# Patient Record
Sex: Female | Born: 1945
Health system: Southern US, Community
[De-identification: ages and names within clinical notes are randomized; demographics above are authoritative.]

## PROBLEM LIST (undated history)

## (undated) DIAGNOSIS — F32A Depression, unspecified: Secondary | ICD-10-CM

## (undated) DIAGNOSIS — C649 Malignant neoplasm of unspecified kidney, except renal pelvis: Secondary | ICD-10-CM

## (undated) DIAGNOSIS — M542 Cervicalgia: Secondary | ICD-10-CM

## (undated) DIAGNOSIS — M1710 Unilateral primary osteoarthritis, unspecified knee: Secondary | ICD-10-CM

## (undated) DIAGNOSIS — M47812 Spondylosis without myelopathy or radiculopathy, cervical region: Secondary | ICD-10-CM

## (undated) DIAGNOSIS — M1990 Unspecified osteoarthritis, unspecified site: Secondary | ICD-10-CM

## (undated) DIAGNOSIS — F329 Major depressive disorder, single episode, unspecified: Secondary | ICD-10-CM

## (undated) DIAGNOSIS — C50911 Malignant neoplasm of unspecified site of right female breast: Secondary | ICD-10-CM

## (undated) DIAGNOSIS — K219 Gastro-esophageal reflux disease without esophagitis: Secondary | ICD-10-CM

## (undated) DIAGNOSIS — M2550 Pain in unspecified joint: Secondary | ICD-10-CM

## (undated) DIAGNOSIS — M545 Low back pain, unspecified: Secondary | ICD-10-CM

## (undated) DIAGNOSIS — I10 Essential (primary) hypertension: Secondary | ICD-10-CM

## (undated) DIAGNOSIS — M76899 Other specified enthesopathies of unspecified lower limb, excluding foot: Secondary | ICD-10-CM

## (undated) DIAGNOSIS — Z8719 Personal history of other diseases of the digestive system: Secondary | ICD-10-CM

## (undated) DIAGNOSIS — F419 Anxiety disorder, unspecified: Secondary | ICD-10-CM

## (undated) DIAGNOSIS — M2540 Effusion, unspecified joint: Secondary | ICD-10-CM

## (undated) DIAGNOSIS — E890 Postprocedural hypothyroidism: Secondary | ICD-10-CM

## (undated) DIAGNOSIS — R002 Palpitations: Secondary | ICD-10-CM

## (undated) DIAGNOSIS — G4730 Sleep apnea, unspecified: Secondary | ICD-10-CM

## (undated) DIAGNOSIS — R200 Anesthesia of skin: Secondary | ICD-10-CM

## (undated) DIAGNOSIS — E0500 Thyrotoxicosis with diffuse goiter without thyrotoxic crisis or storm: Secondary | ICD-10-CM

## (undated) DIAGNOSIS — R51 Headache: Secondary | ICD-10-CM

## (undated) DIAGNOSIS — Z87898 Personal history of other specified conditions: Secondary | ICD-10-CM

## (undated) DIAGNOSIS — M5030 Other cervical disc degeneration, unspecified cervical region: Secondary | ICD-10-CM

## (undated) DIAGNOSIS — N390 Urinary tract infection, site not specified: Secondary | ICD-10-CM

## (undated) DIAGNOSIS — G8929 Other chronic pain: Secondary | ICD-10-CM

## (undated) DIAGNOSIS — K5900 Constipation, unspecified: Secondary | ICD-10-CM

## (undated) DIAGNOSIS — R0602 Shortness of breath: Secondary | ICD-10-CM

## (undated) DIAGNOSIS — K279 Peptic ulcer, site unspecified, unspecified as acute or chronic, without hemorrhage or perforation: Secondary | ICD-10-CM

## (undated) DIAGNOSIS — G4700 Insomnia, unspecified: Secondary | ICD-10-CM

## (undated) DIAGNOSIS — K5730 Diverticulosis of large intestine without perforation or abscess without bleeding: Secondary | ICD-10-CM

## (undated) DIAGNOSIS — N2889 Other specified disorders of kidney and ureter: Secondary | ICD-10-CM

## (undated) DIAGNOSIS — E7800 Pure hypercholesterolemia, unspecified: Secondary | ICD-10-CM

## (undated) DIAGNOSIS — J301 Allergic rhinitis due to pollen: Secondary | ICD-10-CM

## (undated) HISTORY — DX: Spondylosis without myelopathy or radiculopathy, cervical region: M47.812

## (undated) HISTORY — PX: KNEE ARTHROSCOPY: SHX127

## (undated) HISTORY — DX: Morbid (severe) obesity due to excess calories: E66.01

## (undated) HISTORY — DX: Allergic rhinitis due to pollen: J30.1

## (undated) HISTORY — PX: APPENDECTOMY: SHX54

## (undated) HISTORY — DX: Essential (primary) hypertension: I10

## (undated) HISTORY — PX: UPPER GASTROINTESTINAL ENDOSCOPY: SHX188

## (undated) HISTORY — DX: Unilateral primary osteoarthritis, unspecified knee: M17.10

## (undated) HISTORY — DX: Pure hypercholesterolemia, unspecified: E78.00

## (undated) HISTORY — PX: BREAST BIOPSY: SHX20

## (undated) HISTORY — DX: Peptic ulcer, site unspecified, unspecified as acute or chronic, without hemorrhage or perforation: K27.9

## (undated) HISTORY — PX: DIAGNOSTIC LAPAROSCOPY: SUR761

## (undated) HISTORY — DX: Diverticulosis of large intestine without perforation or abscess without bleeding: K57.30

## (undated) HISTORY — PX: TUBAL LIGATION: SHX77

## (undated) HISTORY — DX: Palpitations: R00.2

## (undated) HISTORY — DX: Postprocedural hypothyroidism: E89.0

## (undated) HISTORY — PX: JOINT REPLACEMENT: SHX530

## (undated) HISTORY — PX: OTHER SURGICAL HISTORY: SHX169

## (undated) HISTORY — PX: TONSILLECTOMY: SUR1361

## (undated) HISTORY — DX: Cervicalgia: M54.2

## (undated) HISTORY — DX: Malignant neoplasm of unspecified kidney, except renal pelvis: C64.9

## (undated) HISTORY — DX: Other specified enthesopathies of unspecified lower limb, excluding foot: M76.899

## (undated) HISTORY — PX: DILATION AND CURETTAGE OF UTERUS: SHX78

## (undated) HISTORY — DX: Other cervical disc degeneration, unspecified cervical region: M50.30

## (undated) HISTORY — PX: ESOPHAGOGASTRODUODENOSCOPY: SHX1529

## (undated) HISTORY — PX: ABDOMINAL HYSTERECTOMY: SHX81

## (undated) HISTORY — PX: CATARACT EXTRACTION W/ INTRAOCULAR LENS  IMPLANT, BILATERAL: SHX1307

---

## 1986-09-21 HISTORY — PX: BREAST LUMPECTOMY: SHX2

## 2001-04-13 ENCOUNTER — Other Ambulatory Visit: Admission: RE | Admit: 2001-04-13 | Discharge: 2001-04-13 | Payer: Self-pay | Admitting: Family Medicine

## 2001-05-18 ENCOUNTER — Ambulatory Visit (HOSPITAL_COMMUNITY): Admission: RE | Admit: 2001-05-18 | Discharge: 2001-05-18 | Payer: Self-pay | Admitting: General Surgery

## 2001-12-06 ENCOUNTER — Ambulatory Visit (HOSPITAL_COMMUNITY): Admission: RE | Admit: 2001-12-06 | Discharge: 2001-12-06 | Payer: Self-pay | Admitting: Obstetrics and Gynecology

## 2001-12-06 ENCOUNTER — Encounter: Payer: Self-pay | Admitting: Obstetrics and Gynecology

## 2002-10-28 ENCOUNTER — Emergency Department (HOSPITAL_COMMUNITY): Admission: EM | Admit: 2002-10-28 | Discharge: 2002-10-28 | Payer: Self-pay | Admitting: *Deleted

## 2002-10-28 ENCOUNTER — Encounter: Payer: Self-pay | Admitting: *Deleted

## 2002-11-17 ENCOUNTER — Inpatient Hospital Stay (HOSPITAL_COMMUNITY): Admission: EM | Admit: 2002-11-17 | Discharge: 2002-11-21 | Payer: Self-pay | Admitting: Emergency Medicine

## 2002-11-17 ENCOUNTER — Encounter: Payer: Self-pay | Admitting: Emergency Medicine

## 2002-11-18 ENCOUNTER — Encounter: Payer: Self-pay | Admitting: Internal Medicine

## 2002-11-20 ENCOUNTER — Encounter: Payer: Self-pay | Admitting: *Deleted

## 2002-11-20 ENCOUNTER — Encounter: Payer: Self-pay | Admitting: Internal Medicine

## 2002-12-11 ENCOUNTER — Encounter: Payer: Self-pay | Admitting: Family Medicine

## 2002-12-11 ENCOUNTER — Ambulatory Visit (HOSPITAL_COMMUNITY): Admission: RE | Admit: 2002-12-11 | Discharge: 2002-12-11 | Payer: Self-pay | Admitting: Family Medicine

## 2003-12-13 ENCOUNTER — Ambulatory Visit (HOSPITAL_COMMUNITY): Admission: RE | Admit: 2003-12-13 | Discharge: 2003-12-13 | Payer: Self-pay | Admitting: Family Medicine

## 2005-04-10 ENCOUNTER — Ambulatory Visit (HOSPITAL_COMMUNITY): Admission: RE | Admit: 2005-04-10 | Discharge: 2005-04-10 | Payer: Self-pay | Admitting: Family Medicine

## 2005-05-05 ENCOUNTER — Emergency Department (HOSPITAL_COMMUNITY): Admission: EM | Admit: 2005-05-05 | Discharge: 2005-05-06 | Payer: Self-pay | Admitting: *Deleted

## 2005-10-09 ENCOUNTER — Emergency Department (HOSPITAL_COMMUNITY): Admission: EM | Admit: 2005-10-09 | Discharge: 2005-10-10 | Payer: Self-pay | Admitting: Emergency Medicine

## 2006-06-14 ENCOUNTER — Ambulatory Visit: Payer: Self-pay | Admitting: Family Medicine

## 2006-06-23 ENCOUNTER — Ambulatory Visit (HOSPITAL_COMMUNITY): Admission: RE | Admit: 2006-06-23 | Discharge: 2006-06-23 | Payer: Self-pay | Admitting: Family Medicine

## 2006-07-12 ENCOUNTER — Ambulatory Visit: Payer: Self-pay | Admitting: Family Medicine

## 2006-07-14 ENCOUNTER — Ambulatory Visit: Payer: Self-pay | Admitting: Internal Medicine

## 2006-07-19 ENCOUNTER — Ambulatory Visit: Payer: Self-pay | Admitting: Family Medicine

## 2006-09-07 ENCOUNTER — Encounter: Payer: Self-pay | Admitting: Family Medicine

## 2006-09-07 DIAGNOSIS — M1990 Unspecified osteoarthritis, unspecified site: Secondary | ICD-10-CM | POA: Insufficient documentation

## 2006-09-07 DIAGNOSIS — E785 Hyperlipidemia, unspecified: Secondary | ICD-10-CM | POA: Insufficient documentation

## 2006-09-07 DIAGNOSIS — E039 Hypothyroidism, unspecified: Secondary | ICD-10-CM | POA: Insufficient documentation

## 2006-09-07 DIAGNOSIS — K219 Gastro-esophageal reflux disease without esophagitis: Secondary | ICD-10-CM | POA: Insufficient documentation

## 2006-09-07 DIAGNOSIS — I10 Essential (primary) hypertension: Secondary | ICD-10-CM | POA: Insufficient documentation

## 2006-09-07 DIAGNOSIS — N6019 Diffuse cystic mastopathy of unspecified breast: Secondary | ICD-10-CM | POA: Insufficient documentation

## 2006-09-07 DIAGNOSIS — N318 Other neuromuscular dysfunction of bladder: Secondary | ICD-10-CM | POA: Insufficient documentation

## 2006-09-07 DIAGNOSIS — K5730 Diverticulosis of large intestine without perforation or abscess without bleeding: Secondary | ICD-10-CM | POA: Insufficient documentation

## 2006-09-24 ENCOUNTER — Ambulatory Visit: Payer: Self-pay | Admitting: Family Medicine

## 2006-09-25 ENCOUNTER — Encounter (INDEPENDENT_AMBULATORY_CARE_PROVIDER_SITE_OTHER): Payer: Self-pay | Admitting: Family Medicine

## 2006-09-25 LAB — CONVERTED CEMR LAB
BUN: 13 mg/dL (ref 6–23)
Basophils Absolute: 0 10*3/uL (ref 0.0–0.1)
Basophils Relative: 1 % (ref 0–1)
CO2: 24 meq/L (ref 19–32)
Calcium: 9.3 mg/dL (ref 8.4–10.5)
Chloride: 102 meq/L (ref 96–112)
Creatinine, Ser: 0.92 mg/dL (ref 0.40–1.20)
Eosinophils Relative: 2 % (ref 0–5)
Glucose, Bld: 72 mg/dL (ref 70–99)
HCT: 46 % (ref 36.0–46.0)
Hemoglobin: 15.2 g/dL — ABNORMAL HIGH (ref 12.0–15.0)
Lymphocytes Relative: 37 % (ref 12–46)
Lymphs Abs: 2.6 10*3/uL (ref 0.7–3.3)
MCHC: 33 g/dL (ref 30.0–36.0)
MCV: 92.7 fL (ref 78.0–100.0)
Monocytes Absolute: 0.6 10*3/uL (ref 0.2–0.7)
Monocytes Relative: 8 % (ref 3–11)
Neutro Abs: 3.7 10*3/uL (ref 1.7–7.7)
Neutrophils Relative %: 52 % (ref 43–77)
Platelets: 268 10*3/uL (ref 150–400)
Potassium: 3.9 meq/L (ref 3.5–5.3)
RBC: 4.96 M/uL (ref 3.87–5.11)
RDW: 12.7 % (ref 11.5–14.0)
Sodium: 140 meq/L (ref 135–145)
TSH: 0.331 microintl units/mL — ABNORMAL LOW (ref 0.350–5.50)
WBC: 7.1 10*3/uL (ref 4.0–10.5)

## 2006-09-27 ENCOUNTER — Encounter (INDEPENDENT_AMBULATORY_CARE_PROVIDER_SITE_OTHER): Payer: Self-pay | Admitting: Family Medicine

## 2006-10-08 ENCOUNTER — Ambulatory Visit: Payer: Self-pay | Admitting: Family Medicine

## 2006-10-11 ENCOUNTER — Ambulatory Visit (HOSPITAL_COMMUNITY): Admission: RE | Admit: 2006-10-11 | Discharge: 2006-10-11 | Payer: Self-pay | Admitting: Family Medicine

## 2006-10-29 ENCOUNTER — Telehealth (INDEPENDENT_AMBULATORY_CARE_PROVIDER_SITE_OTHER): Payer: Self-pay | Admitting: Family Medicine

## 2006-10-29 ENCOUNTER — Ambulatory Visit: Payer: Self-pay | Admitting: Family Medicine

## 2006-10-29 DIAGNOSIS — E669 Obesity, unspecified: Secondary | ICD-10-CM | POA: Insufficient documentation

## 2006-10-29 DIAGNOSIS — J301 Allergic rhinitis due to pollen: Secondary | ICD-10-CM | POA: Insufficient documentation

## 2006-10-29 LAB — CONVERTED CEMR LAB

## 2006-11-02 ENCOUNTER — Telehealth (INDEPENDENT_AMBULATORY_CARE_PROVIDER_SITE_OTHER): Payer: Self-pay | Admitting: Family Medicine

## 2006-11-03 ENCOUNTER — Encounter (INDEPENDENT_AMBULATORY_CARE_PROVIDER_SITE_OTHER): Payer: Self-pay | Admitting: *Deleted

## 2006-11-05 ENCOUNTER — Ambulatory Visit (HOSPITAL_COMMUNITY): Admission: RE | Admit: 2006-11-05 | Discharge: 2006-11-05 | Payer: Self-pay | Admitting: Family Medicine

## 2006-11-05 ENCOUNTER — Encounter (INDEPENDENT_AMBULATORY_CARE_PROVIDER_SITE_OTHER): Payer: Self-pay | Admitting: Family Medicine

## 2006-11-10 ENCOUNTER — Encounter (INDEPENDENT_AMBULATORY_CARE_PROVIDER_SITE_OTHER): Payer: Self-pay | Admitting: Family Medicine

## 2006-11-10 ENCOUNTER — Encounter (INDEPENDENT_AMBULATORY_CARE_PROVIDER_SITE_OTHER): Payer: Self-pay | Admitting: *Deleted

## 2006-11-17 ENCOUNTER — Ambulatory Visit: Payer: Self-pay | Admitting: Orthopedic Surgery

## 2006-11-26 ENCOUNTER — Encounter (INDEPENDENT_AMBULATORY_CARE_PROVIDER_SITE_OTHER): Payer: Self-pay | Admitting: Family Medicine

## 2006-12-20 ENCOUNTER — Ambulatory Visit: Payer: Self-pay | Admitting: Orthopedic Surgery

## 2007-01-17 ENCOUNTER — Ambulatory Visit: Payer: Self-pay | Admitting: Orthopedic Surgery

## 2007-01-24 ENCOUNTER — Encounter (HOSPITAL_COMMUNITY): Admission: RE | Admit: 2007-01-24 | Discharge: 2007-02-23 | Payer: Self-pay | Admitting: Orthopedic Surgery

## 2007-01-31 ENCOUNTER — Ambulatory Visit: Payer: Self-pay | Admitting: Orthopedic Surgery

## 2007-02-21 ENCOUNTER — Ambulatory Visit: Payer: Self-pay | Admitting: Orthopedic Surgery

## 2007-03-02 ENCOUNTER — Ambulatory Visit: Payer: Self-pay | Admitting: Family Medicine

## 2007-03-02 LAB — CONVERTED CEMR LAB
Bilirubin Urine: NEGATIVE
Cholesterol, target level: 200 mg/dL
Glucose, Urine, Semiquant: NEGATIVE
HDL goal, serum: 40 mg/dL
Ketones, urine, test strip: NEGATIVE
LDL Goal: 130 mg/dL
Nitrite: POSITIVE
Protein, U semiquant: NEGATIVE
Specific Gravity, Urine: 1.01
Urobilinogen, UA: 0.2
pH: 6

## 2007-03-03 ENCOUNTER — Encounter (INDEPENDENT_AMBULATORY_CARE_PROVIDER_SITE_OTHER): Payer: Self-pay | Admitting: Family Medicine

## 2007-03-07 LAB — CONVERTED CEMR LAB
ALT: 20 units/L (ref 0–35)
AST: 16 units/L (ref 0–37)
Albumin: 4.1 g/dL (ref 3.5–5.2)
Alkaline Phosphatase: 82 units/L (ref 39–117)
BUN: 13 mg/dL (ref 6–23)
Basophils Absolute: 0 10*3/uL (ref 0.0–0.1)
Basophils Relative: 1 % (ref 0–1)
CO2: 27 meq/L (ref 19–32)
Calcium: 8.8 mg/dL (ref 8.4–10.5)
Chloride: 103 meq/L (ref 96–112)
Cholesterol: 216 mg/dL — ABNORMAL HIGH (ref 0–200)
Creatinine, Ser: 1.04 mg/dL (ref 0.40–1.20)
Eosinophils Absolute: 0.1 10*3/uL (ref 0.0–0.7)
Eosinophils Relative: 2 % (ref 0–5)
Glucose, Bld: 73 mg/dL (ref 70–99)
HCT: 43.4 % (ref 36.0–46.0)
HDL: 55 mg/dL (ref >39)
Hemoglobin: 13.7 g/dL (ref 12.0–15.0)
LDL Cholesterol: 149 mg/dL — ABNORMAL HIGH (ref 0–99)
Lymphocytes Relative: 29 % (ref 12–46)
Lymphs Abs: 1.7 10*3/uL (ref 0.7–3.3)
MCHC: 31.6 g/dL (ref 30.0–36.0)
MCV: 97.3 fL (ref 78.0–100.0)
Monocytes Absolute: 0.6 10*3/uL (ref 0.2–0.7)
Monocytes Relative: 9 % (ref 3–11)
Neutro Abs: 3.6 10*3/uL (ref 1.7–7.7)
Neutrophils Relative %: 60 % (ref 43–77)
Platelets: 311 10*3/uL (ref 150–400)
Potassium: 4.7 meq/L (ref 3.5–5.3)
RBC: 4.46 M/uL (ref 3.87–5.11)
RDW: 13.5 % (ref 11.5–14.0)
Sodium: 139 meq/L (ref 135–145)
TSH: 4.865 microintl units/mL (ref 0.350–5.50)
Total Bilirubin: 0.4 mg/dL (ref 0.3–1.2)
Total CHOL/HDL Ratio: 3.9
Total Protein: 7.1 g/dL (ref 6.0–8.3)
Triglycerides: 58 mg/dL (ref <150)
VLDL: 12 mg/dL (ref 0–40)
WBC: 6 10*3/uL (ref 4.0–10.5)

## 2007-03-14 ENCOUNTER — Ambulatory Visit: Payer: Self-pay | Admitting: Orthopedic Surgery

## 2007-04-13 ENCOUNTER — Ambulatory Visit: Payer: Self-pay | Admitting: Family Medicine

## 2007-04-13 ENCOUNTER — Telehealth (INDEPENDENT_AMBULATORY_CARE_PROVIDER_SITE_OTHER): Payer: Self-pay | Admitting: *Deleted

## 2007-04-13 ENCOUNTER — Ambulatory Visit (HOSPITAL_COMMUNITY): Admission: RE | Admit: 2007-04-13 | Discharge: 2007-04-13 | Payer: Self-pay | Admitting: Family Medicine

## 2007-04-18 ENCOUNTER — Ambulatory Visit: Payer: Self-pay | Admitting: Orthopedic Surgery

## 2007-04-20 ENCOUNTER — Ambulatory Visit (HOSPITAL_COMMUNITY): Admission: RE | Admit: 2007-04-20 | Discharge: 2007-04-20 | Payer: Self-pay | Admitting: Orthopedic Surgery

## 2007-04-20 ENCOUNTER — Ambulatory Visit: Payer: Self-pay | Admitting: Orthopedic Surgery

## 2007-04-21 ENCOUNTER — Ambulatory Visit: Payer: Self-pay | Admitting: Orthopedic Surgery

## 2007-04-22 ENCOUNTER — Encounter (HOSPITAL_COMMUNITY): Admission: RE | Admit: 2007-04-22 | Discharge: 2007-05-22 | Payer: Self-pay | Admitting: Orthopedic Surgery

## 2007-04-25 ENCOUNTER — Ambulatory Visit: Payer: Self-pay | Admitting: Orthopedic Surgery

## 2007-05-10 ENCOUNTER — Ambulatory Visit: Payer: Self-pay | Admitting: Orthopedic Surgery

## 2007-05-23 HISTORY — PX: COLONOSCOPY: SHX174

## 2007-05-25 ENCOUNTER — Ambulatory Visit: Payer: Self-pay | Admitting: Family Medicine

## 2007-05-25 LAB — CONVERTED CEMR LAB
ALT: 17 units/L (ref 0–35)
AST: 25 units/L (ref 0–37)
Albumin: 3.7 g/dL (ref 3.5–5.2)
Alkaline Phosphatase: 63 units/L (ref 39–117)
Amylase: 312 units/L — ABNORMAL HIGH (ref 27–131)
BUN: 10 mg/dL (ref 6–23)
Basophils Absolute: 0 10*3/uL (ref 0.0–0.1)
Basophils Relative: 0 % (ref 0–1)
CO2: 25 meq/L (ref 19–32)
Calcium: 8.9 mg/dL (ref 8.4–10.5)
Chloride: 107 meq/L (ref 96–112)
Creatinine, Ser: 0.93 mg/dL (ref 0.40–1.20)
Eosinophils Absolute: 0.2 10*3/uL (ref 0.0–0.7)
Eosinophils Relative: 3 % (ref 0–5)
Glucose, Bld: 107 mg/dL — ABNORMAL HIGH (ref 70–99)
HCT: 40.6 % (ref 36.0–46.0)
Hemoglobin: 13.7 g/dL (ref 12.0–15.0)
Lipase: 34 units/L (ref 11–59)
Lymphocytes Relative: 27 % (ref 12–46)
Lymphs Abs: 1.8 10*3/uL (ref 0.7–3.3)
MCHC: 33.8 g/dL (ref 30.0–36.0)
MCV: 93.7 fL (ref 78.0–100.0)
Monocytes Absolute: 0.5 10*3/uL (ref 0.2–0.7)
Monocytes Relative: 7 % (ref 3–11)
Neutro Abs: 4.4 10*3/uL (ref 1.7–7.7)
Neutrophils Relative %: 63 % (ref 43–77)
Platelets: 217 10*3/uL (ref 150–400)
Potassium: 3.5 meq/L (ref 3.5–5.3)
RBC: 4.33 M/uL (ref 3.87–5.11)
RDW: 13.1 % (ref 11.5–14.0)
Sodium: 139 meq/L (ref 135–145)
Total Bilirubin: 0.5 mg/dL (ref 0.3–1.2)
Total Protein: 6.3 g/dL (ref 6.0–8.3)
WBC: 6.9 10*3/uL (ref 4.0–10.5)

## 2007-05-27 ENCOUNTER — Ambulatory Visit (HOSPITAL_COMMUNITY): Admission: RE | Admit: 2007-05-27 | Discharge: 2007-05-27 | Payer: Self-pay | Admitting: Family Medicine

## 2007-06-07 ENCOUNTER — Ambulatory Visit: Payer: Self-pay | Admitting: Internal Medicine

## 2007-06-07 ENCOUNTER — Ambulatory Visit (HOSPITAL_COMMUNITY): Admission: RE | Admit: 2007-06-07 | Discharge: 2007-06-07 | Payer: Self-pay | Admitting: Internal Medicine

## 2007-06-13 ENCOUNTER — Encounter (INDEPENDENT_AMBULATORY_CARE_PROVIDER_SITE_OTHER): Payer: Self-pay | Admitting: *Deleted

## 2007-06-13 ENCOUNTER — Ambulatory Visit: Payer: Self-pay | Admitting: Orthopedic Surgery

## 2007-06-13 DIAGNOSIS — S83289A Other tear of lateral meniscus, current injury, unspecified knee, initial encounter: Secondary | ICD-10-CM | POA: Insufficient documentation

## 2007-06-17 ENCOUNTER — Telehealth (INDEPENDENT_AMBULATORY_CARE_PROVIDER_SITE_OTHER): Payer: Self-pay | Admitting: *Deleted

## 2007-06-20 ENCOUNTER — Ambulatory Visit: Payer: Self-pay | Admitting: Orthopedic Surgery

## 2007-06-21 ENCOUNTER — Encounter: Payer: Self-pay | Admitting: Orthopedic Surgery

## 2007-06-27 ENCOUNTER — Ambulatory Visit (HOSPITAL_COMMUNITY): Admission: RE | Admit: 2007-06-27 | Discharge: 2007-06-27 | Payer: Self-pay | Admitting: Family Medicine

## 2007-06-28 ENCOUNTER — Telehealth (INDEPENDENT_AMBULATORY_CARE_PROVIDER_SITE_OTHER): Payer: Self-pay | Admitting: *Deleted

## 2007-06-28 ENCOUNTER — Encounter (INDEPENDENT_AMBULATORY_CARE_PROVIDER_SITE_OTHER): Payer: Self-pay | Admitting: Family Medicine

## 2007-07-04 ENCOUNTER — Ambulatory Visit: Payer: Self-pay | Admitting: Orthopedic Surgery

## 2007-07-11 ENCOUNTER — Telehealth (INDEPENDENT_AMBULATORY_CARE_PROVIDER_SITE_OTHER): Payer: Self-pay | Admitting: *Deleted

## 2007-07-11 ENCOUNTER — Encounter (INDEPENDENT_AMBULATORY_CARE_PROVIDER_SITE_OTHER): Payer: Self-pay | Admitting: *Deleted

## 2007-07-11 ENCOUNTER — Ambulatory Visit: Payer: Self-pay | Admitting: Family Medicine

## 2007-07-19 ENCOUNTER — Encounter (INDEPENDENT_AMBULATORY_CARE_PROVIDER_SITE_OTHER): Payer: Self-pay | Admitting: Family Medicine

## 2007-07-20 ENCOUNTER — Telehealth (INDEPENDENT_AMBULATORY_CARE_PROVIDER_SITE_OTHER): Payer: Self-pay | Admitting: *Deleted

## 2007-07-20 LAB — CONVERTED CEMR LAB
ALT: 17 units/L (ref 0–35)
AST: 19 units/L (ref 0–37)
Albumin: 4.1 g/dL (ref 3.5–5.2)
Alkaline Phosphatase: 79 units/L (ref 39–117)
BUN: 12 mg/dL (ref 6–23)
CO2: 28 meq/L (ref 19–32)
Calcium: 9.2 mg/dL (ref 8.4–10.5)
Chloride: 101 meq/L (ref 96–112)
Cholesterol: 163 mg/dL (ref 0–200)
Creatinine, Ser: 0.92 mg/dL (ref 0.40–1.20)
Glucose, Bld: 87 mg/dL (ref 70–99)
HDL: 52 mg/dL (ref >39)
LDL Cholesterol: 96 mg/dL (ref 0–99)
Potassium: 4.4 meq/L (ref 3.5–5.3)
Sodium: 141 meq/L (ref 135–145)
Total Bilirubin: 0.7 mg/dL (ref 0.3–1.2)
Total CHOL/HDL Ratio: 3.1
Total Protein: 7.1 g/dL (ref 6.0–8.3)
Triglycerides: 76 mg/dL (ref <150)
VLDL: 15 mg/dL (ref 0–40)

## 2007-07-21 ENCOUNTER — Encounter (INDEPENDENT_AMBULATORY_CARE_PROVIDER_SITE_OTHER): Payer: Self-pay | Admitting: Family Medicine

## 2007-08-01 ENCOUNTER — Ambulatory Visit: Admission: RE | Admit: 2007-08-01 | Discharge: 2007-08-01 | Payer: Self-pay | Admitting: Family Medicine

## 2007-08-01 ENCOUNTER — Encounter (INDEPENDENT_AMBULATORY_CARE_PROVIDER_SITE_OTHER): Payer: Self-pay | Admitting: Family Medicine

## 2007-08-02 ENCOUNTER — Ambulatory Visit: Payer: Self-pay | Admitting: Orthopedic Surgery

## 2007-08-09 ENCOUNTER — Ambulatory Visit: Payer: Self-pay | Admitting: Pulmonary Disease

## 2007-08-15 ENCOUNTER — Ambulatory Visit: Payer: Self-pay | Admitting: Family Medicine

## 2007-08-15 ENCOUNTER — Telehealth (INDEPENDENT_AMBULATORY_CARE_PROVIDER_SITE_OTHER): Payer: Self-pay | Admitting: *Deleted

## 2007-08-25 ENCOUNTER — Encounter (INDEPENDENT_AMBULATORY_CARE_PROVIDER_SITE_OTHER): Payer: Self-pay | Admitting: Family Medicine

## 2007-08-29 ENCOUNTER — Encounter (INDEPENDENT_AMBULATORY_CARE_PROVIDER_SITE_OTHER): Payer: Self-pay | Admitting: Family Medicine

## 2007-09-02 ENCOUNTER — Encounter (INDEPENDENT_AMBULATORY_CARE_PROVIDER_SITE_OTHER): Payer: Self-pay | Admitting: Family Medicine

## 2007-09-26 ENCOUNTER — Ambulatory Visit: Payer: Self-pay | Admitting: Family Medicine

## 2007-10-10 ENCOUNTER — Encounter (INDEPENDENT_AMBULATORY_CARE_PROVIDER_SITE_OTHER): Payer: Self-pay | Admitting: Family Medicine

## 2007-10-19 ENCOUNTER — Ambulatory Visit: Payer: Self-pay | Admitting: Orthopedic Surgery

## 2007-10-19 DIAGNOSIS — M7650 Patellar tendinitis, unspecified knee: Secondary | ICD-10-CM | POA: Insufficient documentation

## 2007-10-26 ENCOUNTER — Encounter (INDEPENDENT_AMBULATORY_CARE_PROVIDER_SITE_OTHER): Payer: Self-pay | Admitting: Family Medicine

## 2007-11-02 ENCOUNTER — Ambulatory Visit: Payer: Self-pay | Admitting: Orthopedic Surgery

## 2007-11-04 ENCOUNTER — Encounter (INDEPENDENT_AMBULATORY_CARE_PROVIDER_SITE_OTHER): Payer: Self-pay | Admitting: Family Medicine

## 2007-11-24 ENCOUNTER — Telehealth (INDEPENDENT_AMBULATORY_CARE_PROVIDER_SITE_OTHER): Payer: Self-pay | Admitting: *Deleted

## 2007-12-08 ENCOUNTER — Ambulatory Visit: Payer: Self-pay | Admitting: Family Medicine

## 2007-12-14 ENCOUNTER — Ambulatory Visit: Payer: Self-pay | Admitting: Orthopedic Surgery

## 2007-12-21 ENCOUNTER — Telehealth: Payer: Self-pay | Admitting: Orthopedic Surgery

## 2008-01-16 ENCOUNTER — Ambulatory Visit: Payer: Self-pay | Admitting: Orthopedic Surgery

## 2008-02-02 ENCOUNTER — Ambulatory Visit: Payer: Self-pay | Admitting: Family Medicine

## 2008-02-02 ENCOUNTER — Telehealth (INDEPENDENT_AMBULATORY_CARE_PROVIDER_SITE_OTHER): Payer: Self-pay | Admitting: *Deleted

## 2008-02-03 ENCOUNTER — Telehealth (INDEPENDENT_AMBULATORY_CARE_PROVIDER_SITE_OTHER): Payer: Self-pay | Admitting: *Deleted

## 2008-02-03 LAB — CONVERTED CEMR LAB: TSH: 2.614 microintl units/mL (ref 0.350–5.50)

## 2008-02-07 ENCOUNTER — Telehealth: Payer: Self-pay | Admitting: Orthopedic Surgery

## 2008-02-08 ENCOUNTER — Ambulatory Visit (HOSPITAL_COMMUNITY): Admission: RE | Admit: 2008-02-08 | Discharge: 2008-02-08 | Payer: Self-pay | Admitting: Family Medicine

## 2008-02-08 ENCOUNTER — Encounter (INDEPENDENT_AMBULATORY_CARE_PROVIDER_SITE_OTHER): Payer: Self-pay | Admitting: Family Medicine

## 2008-02-10 ENCOUNTER — Encounter (INDEPENDENT_AMBULATORY_CARE_PROVIDER_SITE_OTHER): Payer: Self-pay | Admitting: Family Medicine

## 2008-02-15 ENCOUNTER — Ambulatory Visit: Payer: Self-pay | Admitting: Orthopedic Surgery

## 2008-02-15 DIAGNOSIS — M4802 Spinal stenosis, cervical region: Secondary | ICD-10-CM | POA: Insufficient documentation

## 2008-02-15 DIAGNOSIS — M48061 Spinal stenosis, lumbar region without neurogenic claudication: Secondary | ICD-10-CM | POA: Insufficient documentation

## 2008-02-20 ENCOUNTER — Encounter: Payer: Self-pay | Admitting: Orthopedic Surgery

## 2008-02-20 ENCOUNTER — Encounter (HOSPITAL_COMMUNITY): Admission: RE | Admit: 2008-02-20 | Discharge: 2008-03-21 | Payer: Self-pay | Admitting: Orthopedic Surgery

## 2008-02-24 ENCOUNTER — Telehealth (INDEPENDENT_AMBULATORY_CARE_PROVIDER_SITE_OTHER): Payer: Self-pay | Admitting: *Deleted

## 2008-02-24 ENCOUNTER — Ambulatory Visit: Payer: Self-pay | Admitting: Family Medicine

## 2008-02-24 LAB — CONVERTED CEMR LAB
Bilirubin Urine: NEGATIVE
Glucose, Urine, Semiquant: NEGATIVE
Ketones, urine, test strip: NEGATIVE
Nitrite: POSITIVE
Protein, U semiquant: NEGATIVE
Specific Gravity, Urine: 1.01
Urobilinogen, UA: 0.2
pH: 6.5

## 2008-02-29 ENCOUNTER — Telehealth: Payer: Self-pay | Admitting: Orthopedic Surgery

## 2008-03-02 ENCOUNTER — Encounter (INDEPENDENT_AMBULATORY_CARE_PROVIDER_SITE_OTHER): Payer: Self-pay | Admitting: Family Medicine

## 2008-03-07 ENCOUNTER — Telehealth: Payer: Self-pay | Admitting: Orthopedic Surgery

## 2008-03-15 ENCOUNTER — Encounter: Payer: Self-pay | Admitting: Orthopedic Surgery

## 2008-03-21 ENCOUNTER — Ambulatory Visit: Payer: Self-pay | Admitting: Orthopedic Surgery

## 2008-03-27 ENCOUNTER — Telehealth: Payer: Self-pay | Admitting: Orthopedic Surgery

## 2008-03-27 ENCOUNTER — Ambulatory Visit (HOSPITAL_COMMUNITY): Admission: RE | Admit: 2008-03-27 | Discharge: 2008-03-27 | Payer: Self-pay | Admitting: Orthopedic Surgery

## 2008-04-02 ENCOUNTER — Ambulatory Visit: Payer: Self-pay | Admitting: Orthopedic Surgery

## 2008-04-02 DIAGNOSIS — M5126 Other intervertebral disc displacement, lumbar region: Secondary | ICD-10-CM | POA: Insufficient documentation

## 2008-04-03 ENCOUNTER — Telehealth: Payer: Self-pay | Admitting: Orthopedic Surgery

## 2008-04-12 ENCOUNTER — Telehealth: Payer: Self-pay | Admitting: Orthopedic Surgery

## 2008-04-13 ENCOUNTER — Ambulatory Visit: Payer: Self-pay | Admitting: Family Medicine

## 2008-04-16 LAB — CONVERTED CEMR LAB
ALT: 40 units/L — ABNORMAL HIGH (ref 0–35)
AST: 14 units/L (ref 0–37)
Albumin: 4.1 g/dL (ref 3.5–5.2)
Alkaline Phosphatase: 71 units/L (ref 39–117)
BUN: 18 mg/dL (ref 6–23)
Basophils Absolute: 0 10*3/uL (ref 0.0–0.1)
Basophils Relative: 0 % (ref 0–1)
CO2: 23 meq/L (ref 19–32)
Calcium: 9 mg/dL (ref 8.4–10.5)
Chloride: 100 meq/L (ref 96–112)
Creatinine, Ser: 0.82 mg/dL (ref 0.40–1.20)
Eosinophils Absolute: 0.1 10*3/uL (ref 0.0–0.7)
Eosinophils Relative: 0 % (ref 0–5)
Glucose, Bld: 94 mg/dL (ref 70–99)
HCT: 44.7 % (ref 36.0–46.0)
Hemoglobin: 15.2 g/dL — ABNORMAL HIGH (ref 12.0–15.0)
Lymphocytes Relative: 9 % — ABNORMAL LOW (ref 12–46)
Lymphs Abs: 1.1 10*3/uL (ref 0.7–4.0)
MCHC: 34 g/dL (ref 30.0–36.0)
MCV: 95.9 fL (ref 78.0–100.0)
Monocytes Absolute: 0.6 10*3/uL (ref 0.1–1.0)
Monocytes Relative: 5 % (ref 3–12)
Neutro Abs: 11.2 10*3/uL — ABNORMAL HIGH (ref 1.7–7.7)
Neutrophils Relative %: 86 % — ABNORMAL HIGH (ref 43–77)
Platelets: 271 10*3/uL (ref 150–400)
Potassium: 4.8 meq/L (ref 3.5–5.3)
RBC: 4.66 M/uL (ref 3.87–5.11)
RDW: 15.3 % (ref 11.5–15.5)
Sodium: 139 meq/L (ref 135–145)
TSH: 0.357 microintl units/mL (ref 0.350–4.50)
Total Bilirubin: 0.6 mg/dL (ref 0.3–1.2)
Total Protein: 6.8 g/dL (ref 6.0–8.3)
WBC: 13 10*3/uL — ABNORMAL HIGH (ref 4.0–10.5)

## 2008-04-18 ENCOUNTER — Telehealth (INDEPENDENT_AMBULATORY_CARE_PROVIDER_SITE_OTHER): Payer: Self-pay | Admitting: *Deleted

## 2008-04-20 ENCOUNTER — Telehealth: Payer: Self-pay | Admitting: Orthopedic Surgery

## 2008-04-23 ENCOUNTER — Telehealth (INDEPENDENT_AMBULATORY_CARE_PROVIDER_SITE_OTHER): Payer: Self-pay | Admitting: Family Medicine

## 2008-04-23 ENCOUNTER — Encounter: Admission: RE | Admit: 2008-04-23 | Discharge: 2008-04-23 | Payer: Self-pay | Admitting: Orthopedic Surgery

## 2008-04-27 ENCOUNTER — Encounter (INDEPENDENT_AMBULATORY_CARE_PROVIDER_SITE_OTHER): Payer: Self-pay | Admitting: Family Medicine

## 2008-05-07 ENCOUNTER — Encounter: Admission: RE | Admit: 2008-05-07 | Discharge: 2008-05-07 | Payer: Self-pay | Admitting: Orthopedic Surgery

## 2008-05-21 ENCOUNTER — Encounter: Admission: RE | Admit: 2008-05-21 | Discharge: 2008-05-21 | Payer: Self-pay | Admitting: Orthopedic Surgery

## 2008-05-29 ENCOUNTER — Ambulatory Visit: Payer: Self-pay | Admitting: Family Medicine

## 2008-05-30 ENCOUNTER — Encounter (INDEPENDENT_AMBULATORY_CARE_PROVIDER_SITE_OTHER): Payer: Self-pay | Admitting: Family Medicine

## 2008-05-31 LAB — CONVERTED CEMR LAB
ALT: 25 units/L (ref 0–35)
AST: 17 units/L (ref 0–37)
Albumin: 4.2 g/dL (ref 3.5–5.2)
Alkaline Phosphatase: 74 units/L (ref 39–117)
Bilirubin, Direct: 0.1 mg/dL (ref 0.0–0.3)
Indirect Bilirubin: 0.3 mg/dL (ref 0.0–0.9)
Total Bilirubin: 0.4 mg/dL (ref 0.3–1.2)
Total Protein: 7.2 g/dL (ref 6.0–8.3)

## 2008-06-25 ENCOUNTER — Ambulatory Visit: Payer: Self-pay | Admitting: Orthopedic Surgery

## 2008-06-27 ENCOUNTER — Ambulatory Visit (HOSPITAL_COMMUNITY): Admission: RE | Admit: 2008-06-27 | Discharge: 2008-06-27 | Payer: Self-pay | Admitting: Family Medicine

## 2008-06-27 ENCOUNTER — Ambulatory Visit: Payer: Self-pay | Admitting: Family Medicine

## 2008-07-11 ENCOUNTER — Telehealth (INDEPENDENT_AMBULATORY_CARE_PROVIDER_SITE_OTHER): Payer: Self-pay | Admitting: *Deleted

## 2008-07-16 ENCOUNTER — Ambulatory Visit: Payer: Self-pay | Admitting: Family Medicine

## 2008-07-16 DIAGNOSIS — H8110 Benign paroxysmal vertigo, unspecified ear: Secondary | ICD-10-CM | POA: Insufficient documentation

## 2008-08-08 ENCOUNTER — Ambulatory Visit: Payer: Self-pay | Admitting: Family Medicine

## 2008-08-10 ENCOUNTER — Encounter (INDEPENDENT_AMBULATORY_CARE_PROVIDER_SITE_OTHER): Payer: Self-pay | Admitting: Family Medicine

## 2008-08-14 LAB — CONVERTED CEMR LAB
ALT: 39 units/L — ABNORMAL HIGH (ref 0–35)
AST: 31 units/L (ref 0–37)
Albumin: 4.1 g/dL (ref 3.5–5.2)
Alkaline Phosphatase: 69 units/L (ref 39–117)
BUN: 9 mg/dL (ref 6–23)
Basophils Absolute: 0.1 10*3/uL (ref 0.0–0.1)
Basophils Relative: 1 % (ref 0–1)
CO2: 24 meq/L (ref 19–32)
Calcium: 9 mg/dL (ref 8.4–10.5)
Chloride: 101 meq/L (ref 96–112)
Cholesterol: 184 mg/dL (ref 0–200)
Creatinine, Ser: 0.88 mg/dL (ref 0.40–1.20)
Eosinophils Absolute: 0.2 10*3/uL (ref 0.0–0.7)
Eosinophils Relative: 2 % (ref 0–5)
Glucose, Bld: 104 mg/dL — ABNORMAL HIGH (ref 70–99)
HCT: 42.6 % (ref 36.0–46.0)
HDL: 51 mg/dL (ref >39)
Hemoglobin: 13.6 g/dL (ref 12.0–15.0)
LDL Cholesterol: 105 mg/dL — ABNORMAL HIGH (ref 0–99)
Lymphocytes Relative: 38 % (ref 12–46)
Lymphs Abs: 2.7 10*3/uL (ref 0.7–4.0)
MCHC: 31.9 g/dL (ref 30.0–36.0)
MCV: 94.2 fL (ref 78.0–100.0)
Monocytes Absolute: 0.5 10*3/uL (ref 0.1–1.0)
Monocytes Relative: 7 % (ref 3–12)
Neutro Abs: 3.6 10*3/uL (ref 1.7–7.7)
Neutrophils Relative %: 52 % (ref 43–77)
Platelets: 278 10*3/uL (ref 150–400)
Potassium: 3.9 meq/L (ref 3.5–5.3)
RBC: 4.52 M/uL (ref 3.87–5.11)
RDW: 12.9 % (ref 11.5–15.5)
Sodium: 139 meq/L (ref 135–145)
Total Bilirubin: 0.6 mg/dL (ref 0.3–1.2)
Total CHOL/HDL Ratio: 3.6
Total Protein: 6.8 g/dL (ref 6.0–8.3)
Triglycerides: 138 mg/dL (ref <150)
VLDL: 28 mg/dL (ref 0–40)
WBC: 7 10*3/uL (ref 4.0–10.5)

## 2008-09-06 ENCOUNTER — Encounter: Payer: Self-pay | Admitting: Orthopedic Surgery

## 2008-09-06 ENCOUNTER — Encounter (INDEPENDENT_AMBULATORY_CARE_PROVIDER_SITE_OTHER): Payer: Self-pay | Admitting: Family Medicine

## 2008-09-10 ENCOUNTER — Encounter (INDEPENDENT_AMBULATORY_CARE_PROVIDER_SITE_OTHER): Payer: Self-pay | Admitting: Family Medicine

## 2008-09-25 ENCOUNTER — Ambulatory Visit: Payer: Self-pay | Admitting: Family Medicine

## 2008-09-26 LAB — CONVERTED CEMR LAB
ALT: 41 units/L — ABNORMAL HIGH (ref 0–35)
AST: 47 units/L — ABNORMAL HIGH (ref 0–37)
Albumin: 4.5 g/dL (ref 3.5–5.2)
Alkaline Phosphatase: 57 units/L (ref 39–117)
Bilirubin, Direct: 0.1 mg/dL (ref 0.0–0.3)
Indirect Bilirubin: 0.4 mg/dL (ref 0.0–0.9)
TSH: 0.069 microintl units/mL — ABNORMAL LOW (ref 0.350–4.50)
Total Bilirubin: 0.5 mg/dL (ref 0.3–1.2)
Total Protein: 7.2 g/dL (ref 6.0–8.3)

## 2008-10-01 ENCOUNTER — Ambulatory Visit: Payer: Self-pay | Admitting: Orthopedic Surgery

## 2008-10-02 ENCOUNTER — Encounter (INDEPENDENT_AMBULATORY_CARE_PROVIDER_SITE_OTHER): Payer: Self-pay | Admitting: Family Medicine

## 2008-10-02 ENCOUNTER — Encounter (HOSPITAL_COMMUNITY): Admission: RE | Admit: 2008-10-02 | Discharge: 2008-11-01 | Payer: Self-pay | Admitting: Family Medicine

## 2008-11-01 ENCOUNTER — Encounter (INDEPENDENT_AMBULATORY_CARE_PROVIDER_SITE_OTHER): Payer: Self-pay | Admitting: Family Medicine

## 2008-11-02 ENCOUNTER — Encounter (HOSPITAL_COMMUNITY): Admission: RE | Admit: 2008-11-02 | Discharge: 2008-12-02 | Payer: Self-pay | Admitting: Family Medicine

## 2008-11-14 ENCOUNTER — Encounter: Payer: Self-pay | Admitting: Orthopedic Surgery

## 2008-11-19 ENCOUNTER — Ambulatory Visit: Payer: Self-pay | Admitting: Family Medicine

## 2008-11-20 ENCOUNTER — Encounter (INDEPENDENT_AMBULATORY_CARE_PROVIDER_SITE_OTHER): Payer: Self-pay | Admitting: Family Medicine

## 2008-11-20 LAB — CONVERTED CEMR LAB: TSH: 0.198 microintl units/mL — ABNORMAL LOW (ref 0.350–4.50)

## 2008-11-28 ENCOUNTER — Telehealth (INDEPENDENT_AMBULATORY_CARE_PROVIDER_SITE_OTHER): Payer: Self-pay | Admitting: *Deleted

## 2008-11-28 ENCOUNTER — Encounter (INDEPENDENT_AMBULATORY_CARE_PROVIDER_SITE_OTHER): Payer: Self-pay | Admitting: Family Medicine

## 2008-12-03 ENCOUNTER — Encounter (HOSPITAL_COMMUNITY): Admission: RE | Admit: 2008-12-03 | Discharge: 2009-01-02 | Payer: Self-pay | Admitting: Family Medicine

## 2008-12-12 ENCOUNTER — Encounter (INDEPENDENT_AMBULATORY_CARE_PROVIDER_SITE_OTHER): Payer: Self-pay | Admitting: Family Medicine

## 2008-12-24 ENCOUNTER — Ambulatory Visit: Payer: Self-pay | Admitting: Orthopedic Surgery

## 2008-12-24 DIAGNOSIS — M549 Dorsalgia, unspecified: Secondary | ICD-10-CM | POA: Insufficient documentation

## 2008-12-24 DIAGNOSIS — IMO0002 Reserved for concepts with insufficient information to code with codable children: Secondary | ICD-10-CM | POA: Insufficient documentation

## 2008-12-26 ENCOUNTER — Encounter (INDEPENDENT_AMBULATORY_CARE_PROVIDER_SITE_OTHER): Payer: Self-pay | Admitting: Family Medicine

## 2008-12-31 ENCOUNTER — Ambulatory Visit: Payer: Self-pay | Admitting: Family Medicine

## 2008-12-31 DIAGNOSIS — K279 Peptic ulcer, site unspecified, unspecified as acute or chronic, without hemorrhage or perforation: Secondary | ICD-10-CM | POA: Insufficient documentation

## 2009-01-01 ENCOUNTER — Encounter (INDEPENDENT_AMBULATORY_CARE_PROVIDER_SITE_OTHER): Payer: Self-pay | Admitting: Family Medicine

## 2009-01-01 DIAGNOSIS — Z862 Personal history of diseases of the blood and blood-forming organs and certain disorders involving the immune mechanism: Secondary | ICD-10-CM | POA: Insufficient documentation

## 2009-01-01 DIAGNOSIS — Z8639 Personal history of other endocrine, nutritional and metabolic disease: Secondary | ICD-10-CM

## 2009-01-01 LAB — CONVERTED CEMR LAB
ALT: 54 units/L — ABNORMAL HIGH (ref 0–35)
AST: 34 units/L (ref 0–37)
Albumin: 4.4 g/dL (ref 3.5–5.2)
Alkaline Phosphatase: 76 units/L (ref 39–117)
BUN: 18 mg/dL (ref 6–23)
CO2: 17 meq/L — ABNORMAL LOW (ref 19–32)
Calcium: 8.7 mg/dL (ref 8.4–10.5)
Chloride: 104 meq/L (ref 96–112)
Creatinine, Ser: 0.8 mg/dL (ref 0.40–1.20)
Glucose, Bld: 134 mg/dL — ABNORMAL HIGH (ref 70–99)
Potassium: 4.2 meq/L (ref 3.5–5.3)
Sodium: 139 meq/L (ref 135–145)
TSH: 0.514 microintl units/mL (ref 0.350–4.500)
Total Bilirubin: 0.4 mg/dL (ref 0.3–1.2)
Total Protein: 7.5 g/dL (ref 6.0–8.3)

## 2009-01-07 ENCOUNTER — Ambulatory Visit (HOSPITAL_COMMUNITY): Admission: RE | Admit: 2009-01-07 | Discharge: 2009-01-07 | Payer: Self-pay | Admitting: Family Medicine

## 2009-01-08 ENCOUNTER — Encounter (INDEPENDENT_AMBULATORY_CARE_PROVIDER_SITE_OTHER): Payer: Self-pay | Admitting: *Deleted

## 2009-01-10 ENCOUNTER — Encounter: Payer: Self-pay | Admitting: Orthopedic Surgery

## 2009-01-24 ENCOUNTER — Emergency Department (HOSPITAL_COMMUNITY): Admission: EM | Admit: 2009-01-24 | Discharge: 2009-01-24 | Payer: Self-pay | Admitting: Emergency Medicine

## 2009-01-25 ENCOUNTER — Encounter (INDEPENDENT_AMBULATORY_CARE_PROVIDER_SITE_OTHER): Payer: Self-pay | Admitting: *Deleted

## 2009-02-05 ENCOUNTER — Telehealth (INDEPENDENT_AMBULATORY_CARE_PROVIDER_SITE_OTHER): Payer: Self-pay | Admitting: *Deleted

## 2009-02-06 ENCOUNTER — Ambulatory Visit: Payer: Self-pay | Admitting: Family Medicine

## 2009-02-06 DIAGNOSIS — R002 Palpitations: Secondary | ICD-10-CM | POA: Insufficient documentation

## 2009-02-11 ENCOUNTER — Ambulatory Visit (HOSPITAL_COMMUNITY): Admission: RE | Admit: 2009-02-11 | Discharge: 2009-02-11 | Payer: Self-pay | Admitting: Family Medicine

## 2009-02-13 ENCOUNTER — Ambulatory Visit: Payer: Self-pay | Admitting: Cardiology

## 2009-02-20 ENCOUNTER — Telehealth (INDEPENDENT_AMBULATORY_CARE_PROVIDER_SITE_OTHER): Payer: Self-pay | Admitting: *Deleted

## 2009-02-21 ENCOUNTER — Ambulatory Visit: Payer: Self-pay | Admitting: Family Medicine

## 2009-02-27 ENCOUNTER — Encounter (INDEPENDENT_AMBULATORY_CARE_PROVIDER_SITE_OTHER): Payer: Self-pay | Admitting: Family Medicine

## 2009-03-07 DIAGNOSIS — R1319 Other dysphagia: Secondary | ICD-10-CM | POA: Insufficient documentation

## 2009-03-07 DIAGNOSIS — R079 Chest pain, unspecified: Secondary | ICD-10-CM | POA: Insufficient documentation

## 2009-03-21 ENCOUNTER — Ambulatory Visit: Payer: Self-pay | Admitting: Family Medicine

## 2009-03-22 ENCOUNTER — Encounter (INDEPENDENT_AMBULATORY_CARE_PROVIDER_SITE_OTHER): Payer: Self-pay | Admitting: Family Medicine

## 2009-03-22 LAB — CONVERTED CEMR LAB: TSH: 4.975 microintl units/mL — ABNORMAL HIGH (ref 0.350–4.500)

## 2009-04-03 ENCOUNTER — Encounter (INDEPENDENT_AMBULATORY_CARE_PROVIDER_SITE_OTHER): Payer: Self-pay | Admitting: *Deleted

## 2009-04-03 ENCOUNTER — Ambulatory Visit: Payer: Self-pay | Admitting: Orthopedic Surgery

## 2009-04-03 DIAGNOSIS — M1710 Unilateral primary osteoarthritis, unspecified knee: Secondary | ICD-10-CM

## 2009-04-03 DIAGNOSIS — IMO0002 Reserved for concepts with insufficient information to code with codable children: Secondary | ICD-10-CM | POA: Insufficient documentation

## 2009-04-17 ENCOUNTER — Telehealth (INDEPENDENT_AMBULATORY_CARE_PROVIDER_SITE_OTHER): Payer: Self-pay | Admitting: *Deleted

## 2009-05-03 ENCOUNTER — Ambulatory Visit: Payer: Self-pay | Admitting: Family Medicine

## 2009-05-06 ENCOUNTER — Encounter (INDEPENDENT_AMBULATORY_CARE_PROVIDER_SITE_OTHER): Payer: Self-pay | Admitting: Family Medicine

## 2009-05-06 LAB — CONVERTED CEMR LAB
ALT: 29 units/L (ref 0–35)
AST: 27 units/L (ref 0–37)
Albumin: 4.3 g/dL (ref 3.5–5.2)
Alkaline Phosphatase: 79 units/L (ref 39–117)
BUN: 10 mg/dL (ref 6–23)
CO2: 25 meq/L (ref 19–32)
Calcium: 9.5 mg/dL (ref 8.4–10.5)
Chloride: 101 meq/L (ref 96–112)
Creatinine, Ser: 0.93 mg/dL (ref 0.40–1.20)
Glucose, Bld: 71 mg/dL (ref 70–99)
Potassium: 4.2 meq/L (ref 3.5–5.3)
Sodium: 140 meq/L (ref 135–145)
TSH: 0.203 microintl units/mL — ABNORMAL LOW (ref 0.350–4.500)
Total Bilirubin: 0.4 mg/dL (ref 0.3–1.2)
Total Protein: 7.4 g/dL (ref 6.0–8.3)

## 2009-05-23 ENCOUNTER — Ambulatory Visit: Payer: Self-pay | Admitting: Family Medicine

## 2009-05-23 ENCOUNTER — Ambulatory Visit (HOSPITAL_COMMUNITY): Admission: RE | Admit: 2009-05-23 | Discharge: 2009-05-23 | Payer: Self-pay | Admitting: Family Medicine

## 2009-06-11 ENCOUNTER — Telehealth (INDEPENDENT_AMBULATORY_CARE_PROVIDER_SITE_OTHER): Payer: Self-pay | Admitting: Family Medicine

## 2009-07-03 ENCOUNTER — Ambulatory Visit (HOSPITAL_COMMUNITY): Admission: RE | Admit: 2009-07-03 | Discharge: 2009-07-03 | Payer: Self-pay | Admitting: Obstetrics and Gynecology

## 2009-08-09 ENCOUNTER — Emergency Department (HOSPITAL_COMMUNITY): Admission: EM | Admit: 2009-08-09 | Discharge: 2009-08-09 | Payer: Self-pay | Admitting: Emergency Medicine

## 2010-02-25 ENCOUNTER — Telehealth: Payer: Self-pay | Admitting: Orthopedic Surgery

## 2010-05-29 ENCOUNTER — Ambulatory Visit (HOSPITAL_COMMUNITY): Admission: RE | Admit: 2010-05-29 | Discharge: 2010-05-29 | Payer: Self-pay | Admitting: Neurology

## 2010-06-26 ENCOUNTER — Ambulatory Visit (HOSPITAL_COMMUNITY): Admission: RE | Admit: 2010-06-26 | Discharge: 2010-06-26 | Payer: Self-pay | Admitting: Neurology

## 2010-07-07 ENCOUNTER — Ambulatory Visit (HOSPITAL_COMMUNITY): Admission: RE | Admit: 2010-07-07 | Discharge: 2010-07-07 | Payer: Self-pay | Admitting: Internal Medicine

## 2010-07-08 ENCOUNTER — Encounter: Admission: RE | Admit: 2010-07-08 | Discharge: 2010-07-08 | Payer: Self-pay | Admitting: Neurology

## 2010-08-07 ENCOUNTER — Ambulatory Visit (HOSPITAL_COMMUNITY)
Admission: RE | Admit: 2010-08-07 | Discharge: 2010-08-07 | Payer: Self-pay | Source: Home / Self Care | Admitting: Ophthalmology

## 2010-08-13 ENCOUNTER — Ambulatory Visit (HOSPITAL_COMMUNITY): Admission: RE | Admit: 2010-08-13 | Discharge: 2010-08-13 | Payer: Self-pay | Admitting: Neurology

## 2010-08-21 ENCOUNTER — Ambulatory Visit (HOSPITAL_COMMUNITY)
Admission: RE | Admit: 2010-08-21 | Discharge: 2010-08-21 | Payer: Self-pay | Source: Home / Self Care | Admitting: Ophthalmology

## 2010-09-01 ENCOUNTER — Encounter
Admission: RE | Admit: 2010-09-01 | Discharge: 2010-09-01 | Payer: Self-pay | Source: Home / Self Care | Attending: Neurology | Admitting: Neurology

## 2010-09-12 ENCOUNTER — Ambulatory Visit: Payer: Self-pay | Admitting: Cardiology

## 2010-09-12 DIAGNOSIS — R42 Dizziness and giddiness: Secondary | ICD-10-CM | POA: Insufficient documentation

## 2010-09-16 ENCOUNTER — Encounter: Payer: Self-pay | Admitting: Cardiology

## 2010-10-19 LAB — CONVERTED CEMR LAB
ALT: 37 units/L — ABNORMAL HIGH (ref 0–35)
AST: 32 units/L (ref 0–37)
Albumin: 4.3 g/dL (ref 3.5–5.2)
Alkaline Phosphatase: 80 units/L (ref 39–117)
BUN: 15 mg/dL (ref 6–23)
Bilirubin Urine: NEGATIVE
Blood in Urine, dipstick: NEGATIVE
CO2: 23 meq/L (ref 19–32)
Calcium: 9.4 mg/dL (ref 8.4–10.5)
Chloride: 103 meq/L (ref 96–112)
Creatinine, Ser: 1.05 mg/dL (ref 0.40–1.20)
Glucose, Bld: 82 mg/dL (ref 70–99)
Glucose, Urine, Semiquant: NEGATIVE
Ketones, urine, test strip: NEGATIVE
Nitrite: NEGATIVE
Potassium: 4.1 meq/L (ref 3.5–5.3)
Protein, U semiquant: NEGATIVE
Sodium: 139 meq/L (ref 135–145)
Specific Gravity, Urine: <1.005
TSH: 6.451 microintl units/mL — ABNORMAL HIGH (ref 0.350–4.500)
Total Bilirubin: 0.5 mg/dL (ref 0.3–1.2)
Total Protein: 7.5 g/dL (ref 6.0–8.3)
Urobilinogen, UA: 0.2
WBC Urine, dipstick: NEGATIVE
pH: 6

## 2010-10-21 NOTE — Letter (Signed)
Summary: Handicap Placard Application  Handicap Placard Application   Imported By: Lutricia Horsfall 04/27/2008 13:28:13  _____________________________________________________________________  External Attachment:    Type:   Image     Comment:   External Document

## 2010-10-21 NOTE — Progress Notes (Signed)
Summary: pt called w/question re: Rx steroid dose pack  Phone Note Call from Patient   Caller: Patient Summary of Call: pt called to ask if she was to do a repeat of the steroid dose pack since she saw that she has a refill on it. please call  home [phone] Initial call taken by: Cammie Sickle,  February 29, 2008 3:41 PM  Follow-up for Phone Call        yes if no improvement  Follow-up by: Fuller Canada MD,  February 29, 2008 4:12 PM  Additional Follow-up for Phone Call Additional follow up Details #1::        advised pt Additional Follow-up by: Cammie Sickle,  February 29, 2008 5:11 PM

## 2010-10-21 NOTE — Miscellaneous (Signed)
Summary: Orders Update GI  Clinical Lists Changes  Problems: Added new problem of WELL ADULT EXAM (ICD-V70.0) Orders: Added new Referral order of Gastroenterology Referral (GI) - Signed

## 2010-10-21 NOTE — Miscellaneous (Signed)
  Clinical Lists Changes  Medications: Changed medication from LEVOTHROID 125 MCG TABS (LEVOTHYROXINE SODIUM) Take 1 tablet by mouth once a day to LEVOTHROID 112 MCG TABS (LEVOTHYROXINE SODIUM) Take one tab once daily - Signed Rx of LEVOTHROID 112 MCG TABS (LEVOTHYROXINE SODIUM) Take one tab once daily;  #30 x 4;  Signed;  Entered by: Micah Flesher, LPN;  Authorized by: Franchot Heidelberg MD;  Method used: Electronically to [facility] [address], [place], [place], [place]  [address], Ph: [phone], Fax: [phone] Orders: Added new Test order of T-TSH [phone]) - Signed    Prescriptions: LEVOTHROID 112 MCG TABS (LEVOTHYROXINE SODIUM) Take one tab once daily  #30 x 4   Entered by:   Micah Flesher, LPN   Authorized by:   Franchot Heidelberg MD   Signed by:   Micah Flesher, LPN on 38/75/6433   Method used:   Electronically to        [facility]  [address] (retail)       [address]       [place], [place]  [address]       Ph: [phone]       Fax: [phone]   RxID:   [id number]  Pt called, advised of labs, sent Levothyroid script to [facility], sent lab order to [facility] for TSH in 6 wks.

## 2010-10-21 NOTE — Progress Notes (Signed)
Summary: 11/22/07 lab results  Phone Note Outgoing Call   Call placed by: Sonny Dandy,  November 24, 2007 1:39 PM Summary of Call: message left for patient to return call .................................................................Marland KitchenMarland KitchenBritt Bottom Long  November 24, 2007 1:39 PM  patient returned call and lab results given. New med increase called to [facility] .................................................................Marland KitchenMarland KitchenSonny Dandy  November 24, 2007 2:01 PM  Initial call taken by: Sonny Dandy,  November 24, 2007 2:01 PM

## 2010-10-21 NOTE — Letter (Signed)
Summary: Generic Letter  [facility]  [address]   [place], [place] [address]   Phone: [phone]  Fax: [phone]    01/08/2009  Sara [name] [address] [place], [place]  [address]  Dear Ms. Dolata,   The results of your Ultrasound have been reviewed and found:  Probable Fatty Liver TLC diet, portion control and exercise Keep aapt as scheduled    Sincerely,   Sherilyn Banker LPN

## 2010-10-21 NOTE — Miscellaneous (Signed)
Summary: Orders Update  Clinical Lists Changes  Problems: Added new problem of MEDIAL MENISCUS TEAR (ICD-836.0) Orders: Added new Referral order of Orthopedic Referral (Ortho) - Signed Referral information faxed to provider. Dr. Romeo Apple

## 2010-10-21 NOTE — Assessment & Plan Note (Signed)
Summary: ?SINUS INFECTION/ARC   Vital Signs:  Patient Profile:   65 Years Old Female Height:     66 inches (167.64 cm) Weight:      218 pounds BMI:     35.31 O2 Sat:      96 % Pulse rate:   82 / minute Resp:     16 per minute BP sitting:   132 / 88  Vitals Entered By: Sherilyn Banker (December 08, 2007 10:25 AM)                 PCP:  [name]  Chief Complaint:  sinus congestion.  History of Present Illness: Pt in for acute visit.  She has pain behind eyes and in cheeks. She has been sick for 3 days. She states she has thick mucous and cannot get it out. She is nauseated from post nasald rip. Left ear is full. She is sneezing. She has watery eyes and itching. States cannot get mucous up and cannot comment on color. She denies fever, chills and sweats. She is coughing and notes dry. She denies chest pain, orthopnea, PND and palpitations. Her apetite is good. Denies nausea and vomitting. Denies diarrhea and vconstipation. No urinary sx. Husbnd has same sx. Grandbaby sick as well with similar. She is on Xyzal  and this helps.   Now presents.  Hypertension History:      She denies headache, chest pain, palpitations, dyspnea with exertion, orthopnea, PND, peripheral edema, visual symptoms, neurologic problems, syncope, and side effects from treatment.  She notes no problems with any antihypertensive medication side effects.        Positive major cardiovascular risk factors include female age 85 years old or older, hyperlipidemia, and hypertension.  Negative major cardiovascular risk factors include no history of diabetes, negative family history for ischemic heart disease, and non-tobacco-user status.        Further assessment for target organ damage reveals no history of ASHD, stroke/TIA, or peripheral vascular disease.       Current Allergies: ! DARVON  Past Medical History:    Reviewed history from 09/07/2006 and no changes required:       Diverticulosis, colon  GERD       Hyperlipidemia       Hypertension       Hypothyroidism       Osteoarthritis  Past Surgical History:    Reviewed history from 09/26/2007 and no changes required:       Hysterectomy       Lumpectomy       Tonsillectomy       Right knee arthroscopy August 2008 - Dr. Alison Murray: 08/29/07 - EF 82% - no ischemia   Family History:    Reviewed history from 09/26/2007 and no changes required:       Father: Dead 50 CAD       Mother: Dead  at 36 PE post shoulder surgery  Social History:    Reviewed history from 10/29/2006 and no changes required:       Married       Alcohol use-seldom       Drug use-no       Former Smoker   Risk Factors: Tobacco use:  quit    Year quit:  1987    Pack-years:  27 Drug use:  no Alcohol use:  no  Family History Risk Factors:    Family History of MI in females < 62 years old:  no    Family History of MI in males < 69 years old:  no  Colonoscopy History:    Date of Last Colonoscopy:  06/23/2006  Mammogram History:    Date of Last Mammogram:  07/11/2007  PAP Smear History:    Date of Last PAP Smear:  10/29/2006   Review of Systems      See HPI   Physical Exam  General:     Well-developed,well-nourished,in no acute distress; alert,appropriate and cooperative throughout examination Head:     Normocephalic and atraumatic without obvious abnormalities. No apparent alopecia or balding. Eyes:     No corneal or conjunctival inflammation noted. EOMI. Perrla. Watery. Ears:     External ear exam shows no significant lesions or deformities.  Otoscopic examination reveals clear canals, tympanic membranes are intact bilaterally without bulging, retraction, inflammation or discharge. Hearing is grossly normal bilaterally. Nose:     Mod turbinmate swelling. Clear liquid. Mouth:     Oral mucosa and oropharynx without lesions or exudates.  Teeth in good repair. Neck:     No deformities, masses, or tenderness  noted. Lungs:     Normal respiratory effort, chest expands symmetrically. Lungs are clear to auscultation, no crackles or wheezes. Heart:     Normal rate and regular rhythm. S1 and S2 normal without gallop, murmur, click, rub or other extra sounds.    Impression & Recommendations:  Problem # 1:  ALLERGIC RHINITIS, SEASONAL (ICD-477.0) Cont Xyzal. Add Omnaris with sample box given and method of use outlined.. Push fluids. Assure home filters clean. Limit outdoor activity on high pollen counts.  Complete Medication List: 1)  Levothyroxine Sodium 100 Mcg Tabs (Levothyroxine sodium) .Marland Kitchen.. 1 by mouth once daily 2)  Lisinopril-hydrochlorothiazide 20-12.5 Mg Tabs (Lisinopril-hydrochlorothiazide) .... Once daily 3)  Aspirin 81 Mg Tbec (Aspirin) .... Once daily 4)  Xyzal 5 Mg Tabs (Levocetirizine dihydrochloride) .... One by mouth daily 5)  Neurontin 100 Mg Caps (Gabapentin) .... One at bedtime 6)  Lipitor 20 Mg Tabs (Atorvastatin calcium) .... One daily  Hypertension Assessment/Plan:      The patient's hypertensive risk group is category B: At least one risk factor (excluding diabetes) with no target organ damage.  Her calculated 10 year risk of coronary heart disease is 7 %.  Today's blood pressure is 132/88.  Her blood pressure goal is < 140/90.   Patient Instructions: 1)  Please schedule a follow-up appointment in 1 month.    ]

## 2010-10-21 NOTE — Letter (Signed)
Summary: functional capacity evaluation summary  functional capacity evaluation summary   Imported By: Curtis Sites 12/31/2008 15:23:59  _____________________________________________________________________  External Attachment:    Type:   Image     Comment:   External Document

## 2010-10-21 NOTE — Progress Notes (Signed)
Summary: Mail-in pharmacy request.  Phone Note Call from Patient   Reason for Call: Talk to Doctor Summary of Call: patient needs new scipts for all medications to send to their new insurance company([employer]) for 90 day supply.  [phone] Initial call taken by: moore,donna  Follow-up for Phone Call        Agree.  Reception to call for pick-up Follow-up by: Franchot Heidelberg MD,  November 02, 2006 4:39 PM    Prescriptions: XYZAL 5 MG TABS (LEVOCETIRIZINE DIHYDROCHLORIDE) One by mouth daily  #90 x 1   Entered and Authorized by:   Franchot Heidelberg MD   Signed by:   Franchot Heidelberg MD on 11/02/2006   Method used:   Print then Give to Patient   RxID:   [id number] LISINOPRIL-HYDROCHLOROTHIAZIDE 20-12.5 MG TABS (LISINOPRIL-HYDROCHLOROTHIAZIDE) once daily  #90 x 1   Entered and Authorized by:   Franchot Heidelberg MD   Signed by:   Franchot Heidelberg MD on 11/02/2006   Method used:   Print then Give to Patient   RxID:   [id number] LEVOTHYROXINE SODIUM 125 MCG TABS (LEVOTHYROXINE SODIUM) once daily  #90 x 1   Entered and Authorized by:   Franchot Heidelberg MD   Signed by:   Franchot Heidelberg MD on 11/02/2006   Method used:   Print then Give to Patient   RxID:   [id number]

## 2010-10-21 NOTE — Letter (Signed)
Summary: DISABILITY MEDICAL RECORD REQUEST  DISABILITY MEDICAL RECORD REQUEST   Imported By: Donneta Romberg 02/27/2008 17:30:18  _____________________________________________________________________  External Attachment:    Type:   Image     Comment:   External Document

## 2010-10-21 NOTE — Letter (Signed)
Summary: Letter to pt RE lab results  Letter to pt RE lab results   Imported By: Pablo Lawrence 06/29/2007 15:56:20  _____________________________________________________________________  External Attachment:    Type:   Image     Comment:   External Document

## 2010-10-21 NOTE — Progress Notes (Signed)
Summary: Dexilant for GERD samples  Phone Note Call from Patient   Summary of Call: Pt given samples of Dexilant. 1 MOnth supply. Initial call taken by: Sherilyn Banker LPN,  April 17, 2009 11:15 AM

## 2010-10-21 NOTE — Miscellaneous (Signed)
Summary: Orders Update  Clinical Lists Changes  Problems: Added new problem of SLEEP APNEA (ICD-780.57) Orders: Added new Test order of Split Night (Split Night) - Signed

## 2010-10-21 NOTE — Progress Notes (Signed)
Summary: ESI referral appointment.  Phone Note Outgoing Call   Call placed by: Waldon Reining,  April 20, 2008 8:23 AM Call placed to: Specialist Action Taken: Appt scheduled, Information Sent Summary of Call: Dr. Romeo Apple referred the patient to [facility] for ESI's and her appointment is 04-23-08.

## 2010-10-21 NOTE — Letter (Signed)
Summary: letter x-ray result   letter x-ray result   Imported By: Donneta Romberg 07/26/2007 09:10:24  _____________________________________________________________________  External Attachment:    Type:   Image     Comment:   External Document

## 2010-10-21 NOTE — Progress Notes (Signed)
Summary: ? ABOUT  TAKING TYLENOL  Phone Note Call from Patient   Summary of Call: Sara [name] ([birth year]) WANTS TO KNOW IF SHE CAN TAKE X-STRENGTH  TYLENOL WHILE ON RELAFEN.  HER # IS [id number] Initial call taken by: Jacklynn Ganong,  December 21, 2007 2:07 PM  Follow-up for Phone Call        yes Follow-up by: Fuller Canada MD,  December 21, 2007 5:02 PM  Additional Follow-up for Phone Call Additional follow up Details #1::        Phone Call Completed Additional Follow-up by: Jacklynn Ganong,  December 22, 2007 10:43 AM

## 2010-10-21 NOTE — Progress Notes (Signed)
Summary: GI referral  Phone Note Outgoing Call   Call placed by: Sonny Dandy,  April 13, 2007 12:49 PM Summary of Call: patient called and nformed that [facility] will contact her with date/time for appoint Initial call taken by: Sonny Dandy,  April 13, 2007 12:50 PM

## 2010-10-21 NOTE — Letter (Signed)
Summary: sleep study  sleep study   Imported By: Donneta Romberg 08/16/2007 17:04:49  _____________________________________________________________________  External Attachment:    Type:   Image     Comment:   External Document

## 2010-10-21 NOTE — Assessment & Plan Note (Signed)
Summary: Sara [name]    PCP:  [name]   History of Present Illness: I saw Kumiko Ice back in the office today.  She is now 7 weeks after SARK, lateral menisectomy. had extensive OA of the trochlea and medial condyle   now c/o of 5 days of swelling. relieved by ice and rest   Current Allergies: ! DARVON       Knee Exam  General:    Well-developed, well-nourished, in no acute distress; alert and oriented x 3.    Gait:    limp noted-right.    Skin:    Intact with no erythema; no scarring.    Inspection:    tender over the portals; joint effusion   ROM is 0-120    Impression & Recommendations:  Problem # 1:  TEAR LATERAL MENISCUS (ICD-836.1)  Problem # 2:  OSTEOARTHRITIS, LOWER LEG (ICD-715.16)   Patient Instructions: 1)  Please schedule a follow-up appointment in 1 weeks. 2)  I injected the knee with steroid and sensorcaine using the normal technique.    ]

## 2010-10-21 NOTE — Letter (Signed)
Summary: demographics  demographics   Imported By: Altamease Oiler 06/05/2009 13:38:32  _____________________________________________________________________  External Attachment:    Type:   Image     Comment:   External Document

## 2010-10-21 NOTE — Op Note (Signed)
Summary: Operative Report  Operative Report   Imported By: Eldridge Dace 07/29/2007 09:57:52  _____________________________________________________________________  External Attachment:    Type:   Image     Comment:   op note right knee scope

## 2010-10-21 NOTE — Progress Notes (Signed)
Summary: No patient assistance avialble for neurosurgery.  ---- Converted from flag ---- ---- 06/27/2008 11:49 AM, Franchot Heidelberg MD wrote: Please call [facility], [facility] AND SEE IF THYE HAVE any assistance programs for indigent patients. ------------------------------  I have not been able to find a hospital or clinic that provides assistance to see a neurosurgeon.  Sherilyn Banker, LPN [phone]/[phone] - 16:30  Please notify patient.  Franchot Heidelberg, MD - 07/11/2008 - 16:35

## 2010-10-21 NOTE — Assessment & Plan Note (Signed)
Summary: 4 WEEK FOLLOW UP/SLJ   Vital Signs:  Patient profile:   65 year old female Height:      66 inches Weight:      230 pounds O2 Sat:      98 % Temp:     97.3 degrees F oral Pulse rate:   78 / minute Resp:     20 per minute BP sitting:   110 / 78  (left arm) Cuff size:   large  Vitals Entered By: [name] (March 21, 2009 11:01 AM) CC: f/u palpitations and thyroid, Hypertension Management   Primary Provider:  [name]  CC:  f/u palpitations and thyroid and Hypertension Management.  History of Present Illness: Pt in for recheck.  She notes she is doing fair.  She states she has had some palpitations. She was placed on B-blokcer and this has helped a lot. Holter was done and this is reviewed:  24-hour Holter monitoring was worn from 11:39:09 on May 24 through   11:31:09 on May 25.  Sinus rhythm is noted throughout with an average   heart rate of 94 beats per minute (peak heart rate of a 119 and minimum   heart rate of 53).  Rare-to-occasional premature atrial complexes were   noted without any sustained arrhythmias.  Lead motion artifact is noted.   There were no premature ventricular complexes or pauses.  No clear   correlation with diary entries.     She denies chest pain, orthopnea, PND. She states palpitations are less frequnt and less intense. She drinks cup of coffee a day and rarely uses tea.  She is watching diet and notes not exersizing as she should. She was started on Metoprolol and denies any side-effects. She states perhaps more tired since starting this.  She needs repeat TSH  due to hypothyroidism.   She now presents.  Hypertension History:      She denies headache, chest pain, palpitations, dyspnea with exertion, orthopnea, PND, peripheral edema, visual symptoms, neurologic problems, syncope, and side effects from treatment.  She notes no problems with any antihypertensive medication side effects.  Further comments include: See HPI.        Positive major cardiovascular risk factors include female age 57 years old or older, hyperlipidemia, and hypertension.  Negative major cardiovascular risk factors include no history of diabetes, negative family history for ischemic heart disease, and non-tobacco-user status.        Further assessment for target organ damage reveals no history of ASHD, stroke/TIA, or peripheral vascular disease.     Current Problems (verified): 1)  Hx of Chest Pain, Recurrent  (ICD-786.50) 2)  Hx of Other Dysphagia  (ICD-787.29) 3)  Palpitations  (ICD-785.1) 4)  Liver Function Tests, Abnormal, Hx of  (ICD-V12.2) 5)  Morbid Obesity  (ICD-278.01) 6)  Back Pain  (ICD-724.5) 7)  Anserine Bursitis  (ICD-726.61) 8)  Gerd  (ICD-530.81) 9)  Benign Positional Vertigo  (ICD-386.11) 10)  H N P-lumbar  (ICD-722.10) 11)  Spinal Stenosis, Cervical  (ICD-723.0) 12)  Spinal Stenosis, Lumbar  (ICD-724.02) 13)  Patellar Tendinitis  (ICD-726.64) 14)  ? of Sleep Apnea  (ICD-780.57) 15)  Tear Lateral Meniscus  (ICD-836.1) 16)  Obesity Nos  (ICD-278.00) 17)  Allergic Rhinitis, Seasonal  (ICD-477.0) 18)  Fibrocystic Breast Disease  (ICD-610.1) 19)  Degeneration, Disc Nos - C-spine and L-spine  (ICD-722.6) 20)  Overactive Bladder  (ICD-596.51) 21)  Hx of Pud  (ICD-533.90) 22)  Osteoarthritis  (ICD-715.90) 23)  Hypothyroidism  (  ICD-244.9) 24)  Hypertension  (ICD-401.9) 25)  Hyperlipidemia  (ICD-272.4) 26)  Gerd  (ICD-530.81) 27)  Diverticulosis, Colon  (ICD-562.10)  Current Medications (verified): 1)  Levothroid 112 Mcg Tabs (Levothyroxine Sodium) .... Take 1 Tablet By Mouth Once A Day 2)  Lisinopril-Hydrochlorothiazide 20-12.5 Mg Tabs (Lisinopril-Hydrochlorothiazide) .... Two Daily 3)  Aspirin 81 Mg Tbec (Aspirin) .... Once Daily 4)  Pravachol 40 Mg  Tabs (Pravastatin Sodium) .... One Daily 5)  Flexeril 10 Mg Tabs (Cyclobenzaprine Hcl) .Marland Kitchen.. 1 By Mouth Three Times A Day As Needed 6)  Neurontin 600 Mg Tabs  (Gabapentin) .Marland Kitchen.. 1 Tablet Three Times A Day 7)  Kapidex 60 Mg Cpdr (Dexlansoprazole) .... Once Daily 8)  Metoprolol Tartrate 25 Mg Tabs (Metoprolol Tartrate) .... One Two Times A Day 9)  Lortab 5 5-500 Mg Tabs (Hydrocodone-Acetaminophen) .... 1/2 Tablet By Mouth Every 4 Hrs As Needed Pain  Allergies (verified): 1)  ! Darvon  Past History:  Past Medical History: Last updated: 05/29/2008 Current Problems:  TRANSAMINASES, SERUM, ELEVATED (ICD-790.4) H N P-LUMBAR (ICD-722.10) CYSTITIS, ACUTE (ICD-595.0) SPINAL STENOSIS, CERVICAL (ICD-723.0) SPINAL STENOSIS, LUMBAR (ICD-724.02) PATELLAR TENDINITIS (ICD-726.64) KNEE PAIN (ICD-719.46) ARM PAIN, LEFT (ICD-729.5) ANSERINE BURSITIS, RIGHT (ICD-726.61) ? of SLEEP APNEA (ICD-780.57) TEAR LATERAL MENISCUS (ICD-836.1) OBESITY NOS (ICD-278.00) ALLERGIC RHINITIS, SEASONAL (ICD-477.0) FIBROCYSTIC BREAST DISEASE (ICD-610.1) DEGENERATION, DISC NOS (ICD-722.6) OVERACTIVE BLADDER (ICD-596.51) PUD (ICD-533.90) OSTEOARTHRITIS (ICD-715.90) HYPOTHYROIDISM (ICD-244.9) HYPERTENSION (ICD-401.9) HYPERLIPIDEMIA (ICD-272.4) GERD (ICD-530.81) DIVERTICULOSIS, COLON (ICD-562.10)  Past Surgical History: Last updated: 03/07/2009 1. Hysterectomy 2. Lumpectomy 3. Tonsillectomy 4. Right knee arthroscopy August 2008 - Dr. Romeo Apple 5. Stress Myoview: 08/29/07 - EF 82% - no ischemia  Family History: Last updated: 01-16-2009 Father: Dead 14 CAD Mother: Dead  at 50 PE post shoulder surgery Brothers - 2x - age 44  DDD, DJD knees and 50 - healthy Sisters - 3 sisters - age 31 CAD and CVA and DDD, 34 DDD, 6 ? 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 Resp:  Denies cough, shortness of breath, sputum productive, and wheezing. GI:  Denies abdominal pain, constipation, diarrhea, nausea, and vomiting. GU:  Denies nocturia, urinary frequency, and urinary hesitancy. MS:  Cont with DDD. Sees ortho April 03, 2009 - Dr. Romeo Apple. Has left her on Rx. Did try Celebrex and saw Neurosurgery who switched to Naproxen. States had blood in stool and saw ED in [facility]. States was atrributes to rectal scratch when wiping and she stopped Rx. Told to use Hydrocodone only.Marland Kitchen  Physical Exam  General:  Obese, well devloped. No distress.  Lungs:  Normal respiratory effort, chest expands symmetrically. Lungs are clear to auscultation, no crackles or wheezes. Heart:  Normal rate and regular rhythm. S1 and S2 normal without gallop, murmur, click, rub or other extra sounds. Abdomen:  Bowel sounds positive,abdomen soft and non-tender without masses, organomegaly or hernias noted. Extremities:  No redness, swelling or distress. Psych:  Cognition and judgment appear intact. Alert and cooperative with normal attention span and concentration. No apparent delusions, illusions, hallucinations   Impression & Recommendations:  Problem # 1:  PALPITATIONS (ICD-785.1) Improved.  PACs per holter. Rx as below. Avoid excessive stress, caffeien etc. Agrees. Her updated medication list for this problem includes:    Metoprolol Tartrate 25 Mg Tabs (Metoprolol tartrate) ..... One two times a day  Problem # 2:  MORBID OBESITY (ICD-278.01) Councelled TLC diet, exersize and low fat intake. Urged 30 minutes of exersie 5 days a week. Councelled portion control.  Problem # 3:  SPINAL STENOSIS, LUMBAR (ICD-724.02) See Ortho as set. Rx as per them. Avoid NSAIDS in liue of PUD. Colonoscopuy up to date with recent bright red blood in stool per ED eval and peri-anal scrape.  Problem # 4:  HYPERTENSION  (ICD-401.9) Stable. Rx as below. Some malaise ans fatigue on Rx. May need to cut back on ACE and HCTZ if persists. Her updated medication list for this problem includes:    Lisinopril-hydrochlorothiazide 20-12.5 Mg Tabs (Lisinopril-hydrochlorothiazide) .Marland Kitchen..Marland Kitchen Two daily    Metoprolol Tartrate 25 Mg Tabs (Metoprolol tartrate) ..... One two times a day  Problem # 5:  HYPOTHYROIDISM (ICD-244.9) Repeat TSH to assure adequately supplemented with prior hx of  Graves. Her updated medication list for this problem includes:    Levothroid 112 Mcg Tabs (Levothyroxine sodium) .Marland Kitchen... Take 1 tablet by mouth once a day  Orders: T-TSH (64403-47425)  Problem # 6:  Preventive Health Care (ICD-V70.0) Advised DXA in lieu of hypothyroidism. Cannot afford as has no isnurce at this time. Follow. Aware of risk.  Complete Medication List: 1)  Levothroid 112 Mcg Tabs (Levothyroxine sodium) .... Take 1 tablet by mouth once a day 2)  Lisinopril-hydrochlorothiazide 20-12.5 Mg Tabs (Lisinopril-hydrochlorothiazide) .... Two daily 3)  Aspirin 81 Mg Tbec (Aspirin) .... Once daily 4)  Pravachol 40 Mg Tabs (Pravastatin sodium) .... One daily 5)  Flexeril 10 Mg Tabs (Cyclobenzaprine hcl) .Marland Kitchen.. 1 by mouth three times a day as needed 6)  Neurontin 600 Mg Tabs (Gabapentin) .Marland Kitchen.. 1 tablet three times a day 7)  Kapidex 60 Mg Cpdr (Dexlansoprazole) .... Once daily 8)  Metoprolol Tartrate 25 Mg Tabs (Metoprolol tartrate) .... One two times a day 9)  Lortab 5 5-500 Mg Tabs (Hydrocodone-acetaminophen) .... 1/2 tablet by mouth every 4 hrs as needed pain  Hypertension Assessment/Plan:      The patient's hypertensive risk group is category B: At least one risk factor (excluding diabetes) with no target organ damage.  Her calculated 10 year risk of coronary heart disease is 6 %.  Today's blood pressure is 110/78.  Her blood pressure goal is < 140/90.  Patient Instructions: 1)  Please schedule a follow-up appointment in 2  months.   Preventive Care Screening  Bone Density:    Date:  03/21/2009    Results:  Advised secondary to hypothyroidism

## 2010-10-21 NOTE — Assessment & Plan Note (Signed)
Summary: CHOLS. CHECK   Vital Signs:  Patient Profile:   65 Years Old Female Height:     66 inches (167.64 cm) Weight:      208 pounds BMI:     33.69 O2 Sat:      96 % Temp:     97.2 degrees F Pulse rate:   81 / minute Resp:     16 per minute BP sitting:   126 / 87  Vitals Entered By: Sherilyn Banker (August 15, 2007 9:57 AM)                 PCP:  [name]  Chief Complaint:  follow up visit.  History of Present Illness: Pt in for recheck.  She notes she is doing well. She is excited about Thanksgiving.  She has bursitus of the right knee and sees Dr. Romeo Apple. She saw him Aug 02, 2007. Told to use Aspercreme. States helps some. She rates her pain as a nawing ache - 1 to 2 out ten. Using Tylenol - 2 during day and 2 at night. Keeps her comfortable. She has used Ultram in past and it put her in a trance. States she is due to see Dr.Harrison in February and will defer optomization until that time.  She did have her sleep study.Result is reviewed. Mod snoring - no significant sleep apnea.  Now presents.  Hypertension History:      She denies headache, chest pain, palpitations, dyspnea with exertion, orthopnea, PND, peripheral edema, visual symptoms, neurologic problems, syncope, and side effects from treatment.  She notes no problems with any antihypertensive medication side effects.  Further comments include: Tolerating medications. Watching salt. States had terrible left arm pain a few nights ago.Excruciating. Went from neck to hand. Hurt so much could not use arm. Sx right after intercourse. No other sx at the time. Has left neck base pain. Comes and goes. Has had three spells like this. Pain lasted 5 minutes. States left upper arm was very tight. Took Aspirin and sx resolved - took minute or two. States very worried about heart. Risk factors for heart dz include being female, postmenopousal,HTN and hyperlipidemia. Non-smoker. No early fam hx.        Positive major  cardiovascular risk factors include female age 77 years old or older, hyperlipidemia, and hypertension.  Negative major cardiovascular risk factors include no history of diabetes, negative family history for ischemic heart disease, and non-tobacco-user status.        Further assessment for target organ damage reveals no history of ASHD, stroke/TIA, or peripheral vascular disease.    Lipid Management History:      Positive NCEP/ATP III risk factors include female age 70 years old or older and hypertension.  Negative NCEP/ATP III risk factors include no history of early menopause without estrogen hormone replacement, non-diabetic, no family history for ischemic heart disease, non-tobacco-user status, no ASHD (atherosclerotic heart disease), no prior stroke/TIA, no peripheral vascular disease, and no history of aortic aneurysm.        The patient states that she knows about the "Therapeutic Lifestyle Change" diet.  Her compliance with the TLC diet is fair.  The patient expresses understanding of adjunctive measures for cholesterol lowering.  Adjunctive measures started by the patient include aerobic exercise, fiber, ASA, and omega-3 supplements.  She expresses no side effects from her lipid-lowering medication.  The patient denies any symptoms to suggest myopathy or liver disease from her "statin" therapy.      Current Allergies (  reviewed today): ! DARVON  Past Medical History:    Reviewed history from 09/07/2006 and no changes required:       Diverticulosis, colon       GERD       Hyperlipidemia       Hypertension       Hypothyroidism       Osteoarthritis  Past Surgical History:    Reviewed history from 05/25/2007 and no changes required:       Hysterectomy       Lumpectomy       Tonsillectomy       Right knee arthroscopy August 2008 - Dr. Romeo Apple     Review of Systems      See HPI  General      Denies fatigue and sweats.  CV      See HPI  Resp      Denies cough, shortness of  breath, sputum productive, and wheezing.  GI      Denies abdominal pain, constipation, diarrhea, nausea, and vomiting.  GU      Denies abnormal vaginal bleeding, nocturia, urinary frequency, and urinary hesitancy.   Physical Exam  General:     Well-developed, well-nourished, normal body habitus; no deformities, normal grooming. Neck:     No deformities, masses, or tenderness noted. Lungs:     Normal respiratory effort, chest expands symmetrically. Lungs are clear to auscultation, no crackles or wheezes. Heart:     Normal rate and regular rhythm. S1 and S2 normal without gallop, murmur, click, rub or other extra sounds. Abdomen:     Bowel sounds positive,abdomen soft and non-tender without masses, organomegaly or hernias noted.  No surgical scars to suggest lap choly - does have hx scar lower mid abdomen Extremities:     No redness or swelling. Psych:     Cognition and judgment appear intact. Alert and cooperative with normal attention span and concentration. No apparent delusions, illusions, hallucinations    Impression & Recommendations:  Problem # 1:  ARM PAIN, LEFT (ICD-729.5) Post coital. Sx post exertion only. Three spells. EKG shows no acute ST changes. Reassured. I suspect this is muscular in nature but she is concerned about cardiac. Advised will refer for Cardiology eval and perhaps stress test. Agrees.  Orders: EKG w/ Interpretation (93000)   Problem # 2:  ? of SLEEP APNEA (ICD-780.57) Neg sleep study. Sx likley related to snoring. Advised wedge shirt, nasal lifters and sleeping on side.  Problem # 3:  ANSERINE BURSITIS, RIGHT (ICD-726.61) Optomize per Dr. Romeo Apple.  Problem # 4:  HYPERTENSION (ICD-401.9) To goal. Meds as is.TLC encouraged. Her updated medication list for this problem includes:    Lisinopril-hydrochlorothiazide 20-12.5 Mg Tabs (Lisinopril-hydrochlorothiazide) ..... Once daily   Problem # 5:  HYPERLIPIDEMIA (ICD-272.4) At goal. Statin as is.  TLC a must - councelled diet, exersize and low fat intake. Her updated medication list for this problem includes:    Lipitor 20 Mg Tabs (Atorvastatin calcium) ..... One daily   Problem # 6:  HYPOTHYROIDISM (ICD-244.9) TSH at goal in June. No change. Follow periodically. Her updated medication list for this problem includes:    Levothyroxine Sodium 100 Mcg Tabs (Levothyroxine sodium) .Marland Kitchen... 1 by mouth once daily   Complete Medication List: 1)  Levothyroxine Sodium 100 Mcg Tabs (Levothyroxine sodium) .Marland Kitchen.. 1 by mouth once daily 2)  Lisinopril-hydrochlorothiazide 20-12.5 Mg Tabs (Lisinopril-hydrochlorothiazide) .... Once daily 3)  Aspirin 81 Mg Tbec (Aspirin) .... Once daily 4)  Xyzal 5 Mg Tabs (Levocetirizine  dihydrochloride) .... One by mouth daily 5)  Neurontin 100 Mg Caps (Gabapentin) .... One at bedtime 6)  Lipitor 20 Mg Tabs (Atorvastatin calcium) .... One daily  Hypertension Assessment/Plan:      The patient's hypertensive risk group is category B: At least one risk factor (excluding diabetes) with no target organ damage.  Her calculated 10 year risk of coronary heart disease is 7 %.  Today's blood pressure is 126/87.  Her blood pressure goal is < 140/90.  Lipid Assessment/Plan:      Based on NCEP/ATP III, the patient's risk factor category is "2 or more risk factors and a calculated 10 year CAD risk of < 20%".  From this information, the patient's calculated lipid goals are as follows: Total cholesterol goal is 200; LDL cholesterol goal is 130; HDL cholesterol goal is 40; Triglyceride goal is 150.     Patient Instructions: 1)  Please schedule a follow-up appointment in 6 weeks.    ]     EKG  Procedure date:  08/15/2007  Findings:      abnormal:  HR 85 BPM ? Biatrial enlargement   EKG  Procedure date:  08/15/2007  Findings:      abnormal:  HR 85 BPM ? Biatrial enlargement

## 2010-10-21 NOTE — Assessment & Plan Note (Signed)
Summary: 3 M RE-CK BACK/MEDICAID/CAF   Primary Provider:  [name]  CC:  back and neck pain .  History of Present Illness: I saw Sara [name] in the office today for a followup visit.  She is a 65 years old woman with the complaint of:  3 month recheck on the back and right knee.  Mrs. Alkema returns complaining of pain in the cervical spine, which is worse than when she saw Dr. Teena Dunk or in April with numbness in her hand and constant neck and shoulder pain.  Lortab 5 for pain advises 1/2 tablet every 4 hrs, 1/2 tablet does not work anymore.  Prednisone 5 given last visit, helped alot.  The right knee is better.  Last ESI in the lumbar spine was in July 2009.  MRI C spine 02/08/08 MULTILEVEL CERVICAL SPONDYLOSIS    MRI right knee 2008.   MRI L SPINE 2009. Right posterolateral disc herniation at L5-S1 that could compress the right S1 nerve root.  Facet arthropathy at this level as well, right worse than left.   L4-5:  Broad-based disc herniation without definite neural compression.  Mild narrowing of the lateral recesses.   L3-4:  Facet arthropathy.  2 mm of anterolisthesis.  Shallow protrusion of disc material.  No definite neural  Current Medications (verified): 1)  Levothroid 125 Mcg Tabs (Levothyroxine Sodium) .... Take 1 Tablet By Mouth Once A Day 2)  Lisinopril-Hydrochlorothiazide 20-12.5 Mg Tabs (Lisinopril-Hydrochlorothiazide) .... Two Daily 3)  Aspirin 81 Mg Tbec (Aspirin) .... Once Daily 4)  Pravachol 40 Mg  Tabs (Pravastatin Sodium) .... One Daily 5)  Flexeril 10 Mg Tabs (Cyclobenzaprine Hcl) .Marland Kitchen.. 1 By Mouth Three Times A Day As Needed 6)  Neurontin 600 Mg Tabs (Gabapentin) .Marland Kitchen.. 1 Tablet Three Times A Day 7)  Kapidex 60 Mg Cpdr (Dexlansoprazole) .... Once Daily 8)  Metoprolol Tartrate 25 Mg Tabs (Metoprolol Tartrate) .... One Two Times A Day 9)  Lortab 5 5-500 Mg Tabs (Hydrocodone-Acetaminophen) .... One By Mouth Q 4 Hrs As Needed  Pain  Allergies (verified): 1)  ! Darvon  Past History:  Past medical, surgical, family and social histories (including risk factors) reviewed, and no changes noted (except as noted below).  Past Medical History: Reviewed history from 05/29/2008 and no changes required. Current Problems:  TRANSAMINASES, SERUM, ELEVATED (ICD-790.4) H N P-LUMBAR (ICD-722.10) CYSTITIS, ACUTE (ICD-595.0) SPINAL STENOSIS, CERVICAL (ICD-723.0) SPINAL STENOSIS, LUMBAR (ICD-724.02) PATELLAR TENDINITIS (ICD-726.64) KNEE PAIN (ICD-719.46) ARM PAIN, LEFT (ICD-729.5) ANSERINE BURSITIS, RIGHT (ICD-726.61) ? of SLEEP APNEA (ICD-780.57) TEAR LATERAL MENISCUS (ICD-836.1) OBESITY NOS (ICD-278.00) ALLERGIC RHINITIS, SEASONAL (ICD-477.0) FIBROCYSTIC BREAST DISEASE (ICD-610.1) DEGENERATION, DISC NOS (ICD-722.6) OVERACTIVE BLADDER (ICD-596.51) PUD (ICD-533.90) OSTEOARTHRITIS (ICD-715.90) HYPOTHYROIDISM (ICD-244.9) HYPERTENSION (ICD-401.9) HYPERLIPIDEMIA (ICD-272.4) GERD (ICD-530.81) DIVERTICULOSIS, COLON (ICD-562.10)  Past Surgical History: Reviewed history from 03/07/2009 and no changes required. 1. Hysterectomy 2. Lumpectomy 3. Tonsillectomy 4. Right knee arthroscopy August 2008 - Dr. Romeo Apple 5. Stress Myoview: 08/29/07 - EF 82% - no ischemia  Family History: Reviewed history from 12/31/2008 and no changes required. Father: Dead 33 CAD Mother: Dead  at 40 PE post shoulder surgery Brothers - 2x - age 50  DDD, DJD knees and 36 - healthy Sisters - 3 sisters - age 37 CAD and CVA and DDD, 29 DDD, 47 ? Kids - 2 boys - age 30 healthy and 43 -  MS per hx  Social History: Reviewed history from 12/31/2008 and no changes required. 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 1/2 tablet by mouth every 4 hrs as needed pain Her updated medication list for this problem includes:    Aspirin 81 Mg Tbec (Aspirin) ..... Once daily    Flexeril 10 Mg Tabs (Cyclobenzaprine hcl) .Marland Kitchen... 1 by mouth three times a day as needed    Lortab 5 5-500 Mg Tabs (Hydrocodone-acetaminophen) ..... One by mouth q 4 hrs as needed pain  Problem # 2:  SPINAL STENOSIS, CERVICAL (ICD-723.0)  Orders: Est. Patient Level II (04540)  Problem # 3:  SPINAL STENOSIS, LUMBAR (ICD-724.02)  Orders: Est. Patient Level II (98119)  Problem # 4:  KNEE, ARTHRITIS, DEGEN./OSTEO (ICD-715.96)  The following medications were removed from the medication list:    Lortab 5 5-500 Mg Tabs (Hydrocodone-acetaminophen) .Marland Kitchen... 1/2 tablet by mouth every 4 hrs as needed pain Her updated medication list for this problem includes:    Aspirin 81 Mg Tbec (Aspirin) ..... Once daily    Flexeril 10 Mg Tabs (Cyclobenzaprine hcl) .Marland Kitchen... 1 by mouth three times a day as needed    Lortab 5 5-500 Mg Tabs (Hydrocodone-acetaminophen) ..... One by mouth q 4 hrs as needed pain  Orders: Est. Patient Level II (14782) Assessment:  There are multiple problems going on here, but most of them are confined to the axial spine. The RIGHT knee is mild at this point and stable with nonsurgical arthritis. At this point.  Today's major complaint was of the cervical spine with constant pain and radicular symptoms in the RIGHT upper extremity.  She has seen Dr. Gerlene Fee in April and he said no treatment was needed, but her symptoms are worse and she is referred back to him for further care.  Medications Added to Medication List This Visit: 1)  Lortab 5 5-500 Mg Tabs (Hydrocodone-acetaminophen) .... One by mouth q 4 hrs as needed pain  Patient Instructions: 1)  Call and get appt with Dr. Gerlene Fee, pt already sees him for back and neck, last seen in April 2010. 2)  Please schedule a  follow-up appointment as needed. Prescriptions: LORTAB 5 5-500 MG TABS (HYDROCODONE-ACETAMINOPHEN) one by mouth q 4 hrs as needed pain  #60 x 2   Entered and Authorized by:   Fuller Canada MD   Signed by:   Fuller Canada MD on 04/03/2009   Method used:   Print then Give to Patient   RxID:   [id number]

## 2010-10-21 NOTE — Letter (Signed)
Summary: [facility] office notes  [facility] office notes   Imported By: Donneta Romberg 12/12/2008 11:13:30  _____________________________________________________________________  External Attachment:    Type:   Image     Comment:   External Document

## 2010-10-21 NOTE — Progress Notes (Signed)
Summary: neurontin dose  Phone Note Call from Patient Call back at Home Phone [phone]   Reason for Call: Refill Medication Summary of Call: wants neurontin dosage to be increased, takes100mg / one tablet three times a day, not helping, uses [facility]Note that Erby Pian gave her flexeril 10mg  to take three times a day also, still taking. Initial call taken by: Ether Griffins,  Feb 07, 2008 1:49 PM  Follow-up for Phone Call        increase to 200mg  three times a day  Follow-up by: Fuller Canada MD,  Feb 07, 2008 1:56 PM  Additional Follow-up for Phone Call Additional follow up Details #1::        called in Additional Follow-up by: Ether Griffins,  Feb 07, 2008 3:38 PM

## 2010-10-21 NOTE — Assessment & Plan Note (Signed)
Summary: 6 WEEK FOLLOW UP/ARC   Vital Signs:  Patient Profile:   65 Years Old Female Height:     66 inches (167.64 cm) Weight:      208 pounds BMI:     33.69 O2 Sat:      97 % Temp:     97.5 degrees F Pulse rate:   80 / minute Resp:     14 per minute BP sitting:   134 / 81  Vitals Entered By: Sherilyn Banker (May 25, 2007 9:41 AM)                 PCP:  [name]  Chief Complaint:  follow up visit.  History of Present Illness: Pt in for recheck.  She is doing fair. She states she is still struggling with right knee pain. Dr. Starling Manns did arthroscopic surgery on her knee the first week in August. She has completed  PT and is set to see him in one month. She states her pain varies from day to day  - sometimes a 7/10. On good days 1/10. She is only taking an 800 mg Ibuprofen at bedtime and notes it helps a lot to help her sleep. Does not use during day. Last CBC and BMP done June 2008 - normal.  She also adds she is set to have her colonscopy on Sept 16, 2008. She states she has a good apetite. Note not as good as it should be. She gets some discomfort over right UQ. Describes pain as an aching. Worse with coffee. Not sure about fatty foods as she has cut back on this. She has hx of cholecystectomy in the past - not sure why. Not sure if it is really out. No nausea or vomitting. Notes some lose stools. No constipation. No blood in it. No clay-like stools - adds sometimes whitish.  Now presents.      Hypertension History:      She denies headache, chest pain, palpitations, dyspnea with exertion, orthopnea, PND, peripheral edema, visual symptoms, neurologic problems, syncope, and side effects from treatment.  She notes no problems with any antihypertensive medication side effects.  Further comments include: Takes daily. Watching salt intake.        Positive major cardiovascular risk factors include female age 74 years old or older, hyperlipidemia, and hypertension.   Negative major cardiovascular risk factors include no history of diabetes, negative family history for ischemic heart disease, and non-tobacco-user status.        Further assessment for target organ damage reveals no history of ASHD, stroke/TIA, or peripheral vascular disease.    Lipid Management History:      Positive NCEP/ATP III risk factors include female age 70 years old or older and hypertension.  Negative NCEP/ATP III risk factors include no history of early menopause without estrogen hormone replacement, non-diabetic, no family history for ischemic heart disease, non-tobacco-user status, no ASHD (atherosclerotic heart disease), no prior stroke/TIA, no peripheral vascular disease, and no history of aortic aneurysm.        The patient states that she knows about the "Therapeutic Lifestyle Change" diet.  Her compliance with the TLC diet is fair.  The patient expresses understanding of adjunctive measures for cholesterol lowering.  Adjunctive measures started by the patient include aerobic exercise, fiber, ASA, and omega-3 supplements.  She expresses no side effects from her lipid-lowering medication.  The patient denies any symptoms to suggest myopathy or liver disease from her "statin" therapy.  Comments: States she had  some foot cramps since starting this. Last few days and now gone. Thinks RUQ may be related to this as well.    Current Allergies (reviewed today): ! DARVON  Past Medical History:    Reviewed history from 09/07/2006 and no changes required:       Diverticulosis, colon       GERD       Hyperlipidemia       Hypertension       Hypothyroidism       Osteoarthritis  Past Surgical History:    Reviewed history from 04/26/2007 and no changes required:       Hysterectomy       Lumpectomy       Tonsillectomy       Right knee arthroscopy August 2008 - Dr. Romeo Apple   Family History:    Reviewed history from 10/29/2006 and no changes required:       Father: Dead 29 CAD        Mother: Dead  at 68 PE post shoulder surgery       Siblings:   Social History:    Reviewed history from 10/29/2006 and no changes required:       Married       Alcohol use-seldom       Drug use-no       Former Smoker    Review of Systems      See HPI   Physical Exam  General:     Well-developed,well-nourished,in no acute distress; alert,appropriate and cooperative throughout examination Lungs:     Normal respiratory effort, chest expands symmetrically. Lungs are clear to auscultation, no crackles or wheezes. Heart:     Normal rate and regular rhythm. S1 and S2 normal without gallop, murmur, click, rub or other extra sounds. Abdomen:     Bowel sounds positive,abdomen soft and non-tender without masses, organomegaly or hernias noted. Notes some fullness and sensation of nausea when palpated over RUQ. No surgical scars to suggest lap choly - does have hx scar lower mid abdomen Extremities:     No redness or swelling.    Impression & Recommendations:  Problem # 1:  RUQ PAIN (ICD-789.01) Check LFTs as she just started statin. Obtain gallbladder US. Check amylase and lipase as well. Start Prevacid Solutab given use of high risk med in form of 800 mg Ibuprofen for Right knee pain and hx of PUD. DC Ibuprofen. See below. Await lab results and optomize. Follow through with colonscopy on Sept 16, 2008 as ordered per Dr. Jena Gauss. Orders: T-Comprehensive Metabolic Panel [phone]) T-CBC w/Diff [phone]) T-Amylase [phone]) T-Lipase [id number]) Venipuncture ([id number])   Problem # 2:  HYPERLIPIDEMIA (ICD-272.4) Check LFTs. TLC a must. Recheck lipids in 6 weeks unless we have to stop statin due to liver function change or gallbladder US results. Her updated medication list for this problem includes:    Lipitor 20 Mg Tabs (Atorvastatin calcium) ..... One daily   Problem # 3:  HYPERTENSION (ICD-401.9) Stable. Diet, exersize and low salt intake a must. Advised yearly eye exam  and dental care. Meds as is. Her updated medication list for this problem includes:    Lisinopril-hydrochlorothiazide 20-12.5 Mg Tabs (Lisinopril-hydrochlorothiazide) ..... Once daily   Problem # 4:  GERD (ICD-530.81) See # 1. Trial prevacid on high risk med in light of hx of PUD. DC Ibuprofen as soon as possible and switch to trial Tylenol 500 mg by mouth three times a day as needed. See Dr. Romeo Apple mid Sept for follow-up on  this.  Complete Medication List: 1)  Levothyroxine Sodium 100 Mcg Tabs (Levothyroxine sodium) .Marland Kitchen.. 1 by mouth once daily 2)  Lisinopril-hydrochlorothiazide 20-12.5 Mg Tabs (Lisinopril-hydrochlorothiazide) .... Once daily 3)  Aspirin 81 Mg Tbec (Aspirin) .... Once daily 4)  Xyzal 5 Mg Tabs (Levocetirizine dihydrochloride) .... One by mouth daily 5)  Neurontin 100 Mg Caps (Gabapentin) .... One at bedtime 6)  Lipitor 20 Mg Tabs (Atorvastatin calcium) .... One daily  Hypertension Assessment/Plan:      The patient's hypertensive risk group is category B: At least one risk factor (excluding diabetes) with no target organ damage.  Her calculated 10 year risk of coronary heart disease is 9 %.  Today's blood pressure is 134/81.  Her blood pressure goal is < 140/90.  Lipid Assessment/Plan:      Based on NCEP/ATP III, the patient's risk factor category is "2 or more risk factors and a calculated 10 year CAD risk of < 20%".  From this information, the patient's calculated lipid goals are as follows: Total cholesterol goal is 200; LDL cholesterol goal is 130; HDL cholesterol goal is 40; Triglyceride goal is 150.     Patient Instructions: 1)  Please schedule a follow-up appointment in 6 weeks - sooner if sx worsen.

## 2010-10-21 NOTE — Letter (Signed)
Summary: harrison referral  harrison referral   Imported By: Magdalene River 11/15/2006 14:59:27  _____________________________________________________________________  External Attachment:    Type:   Image     Comment:   External Document

## 2010-10-21 NOTE — Progress Notes (Signed)
Summary: cane  Phone Note Call from Patient Call back at [facility] Phone [phone]   Summary of Call: would like cane, I wrote rx anf pt is coming to get cane rx to take to [place]. Initial call taken by: Ether Griffins,  April 03, 2008 10:33 AM    New/Updated Medications: * STANDARD CANE to use while walking   Prescriptions: STANDARD CANE to use while walking  #1 cane x 0   Entered by:   Ether Griffins   Authorized by:   Fuller Canada MD   Signed by:   Ether Griffins on 04/03/2008   Method used:   Print then Give to Patient   RxID:   [id number]

## 2010-10-21 NOTE — Progress Notes (Signed)
Summary: please advise  Phone Note Call from Patient   Summary of Call: patient's huband called in and states wife needs samples of her decogestion medication Initial call taken by: Curtis Sites,  November 28, 2008 10:23 AM  Follow-up for Phone Call        pt advised to try OTC decongestant Follow-up by: Sherilyn Banker LPN,  November 28, 2008 10:42 AM

## 2010-10-21 NOTE — Assessment & Plan Note (Signed)
Summary: 8 week follow up/arc   Vital Signs:  Patient Profile:   65 Years Old Female Height:     66 inches (167.64 cm) Weight:      227 pounds BMI:     36.77 O2 Sat:      96 % Temp:     97.8 degrees F Pulse rate:   90 / minute Resp:     14 per minute BP sitting:   137 / 83  Vitals Entered By: Sherilyn Banker (April 13, 2008 1:31 PM)                 PCP:  [name]  Chief Complaint:  follow up visit.  History of Present Illness: Pt in for recheck.  She states she has seen Dr. Romeo Apple and had MRI of her lower back. This was done related to lower back and right leg radiculopathy. She is on Lortab adn Neurontin and per her report is being set up for epidural injections. This is verified via chart review and MRI is checked:  Right posterolateral disc herniation at L5-S1 that could compress the right S1 nerve root.  Facet arthropathy at this level as well, right worse than left. L4-5:  Broad-based disc herniation without definite neural compression.  Mild narrowing of the lateral recesses.  L3-4:  Facet arthropathy.  2 mm of anterolisthesis.  Shallow protrusion of disc material.  No definite neural compression.  States pain 8/10. States worse with walking and sitting. Better with pain meds but not much. States Dr. Romeo Apple gave cane for walking and helps a little.   She is not sure when she sees Dr. Romeo Apple and states waiting on [facility] to call about when she can the epidurals.   She has severe DDD in C-spine as well per MRI and states Dr. Romeo Apple has only focussed on low back. She states has electrical sensation in upper arm on right and back. States hand and fingers goes numb.  Requests neurosurgical evaluation.   She now presents.STates feels shaky and anxious with pain. States thinks thyroid may be out.   She states she has felt congetsed. She states has cough, states has mucous but has not looked at color. She does feel winded. SHe does have  palpitations. She denies orthopnea, PND and states has had puffiness to legs due to steroid use.   She states apetite is good. She states steroids as cause. She notes no nausea or vomitting. No diarrhea or constipation.States has GERD. Got Prilosec from Dr. Romeo Apple and states was given due to Relafen use. Now off.   She states urine doing fine.  She states doibg poorly emotionally as uncomfortable with neck and back. She is sleeping fair. She is irritable. Her concetration is not the best.   Hypertension History:      She denies headache, chest pain, palpitations, dyspnea with exertion, orthopnea, PND, peripheral edema, visual symptoms, neurologic problems, syncope, and side effects from treatment.  She notes no problems with any antihypertensive medication side effects.  Further comments include: Watching salt. Taking Rx.        Positive major cardiovascular risk factors include female age 59 years old or older, hyperlipidemia, and hypertension.  Negative major cardiovascular risk factors include no history of diabetes, negative family history for ischemic heart disease, and non-tobacco-user status.        Further assessment for target organ damage reveals no history of ASHD, stroke/TIA, or peripheral vascular disease.    Lipid Management History:  Positive NCEP/ATP III risk factors include female age 74 years old or older and hypertension.  Negative NCEP/ATP III risk factors include no history of early menopause without estrogen hormone replacement, non-diabetic, no family history for ischemic heart disease, non-tobacco-user status, no ASHD (atherosclerotic heart disease), no prior stroke/TIA, no peripheral vascular disease, and no history of aortic aneurysm.        The patient states that she knows about the "Therapeutic Lifestyle Change" diet.  Her compliance with the TLC diet is fair.  The patient expresses understanding of adjunctive measures for cholesterol lowering.  Adjunctive measures  started by the patient include aerobic exercise, fiber, ASA, and omega-3 supplements.  She expresses no side effects from her lipid-lowering medication.  The patient denies any symptoms to suggest myopathy or liver disease from her "statin" therapy.  Comments: Out of Lipitor since May 2009. States not sure why - just ran out.      Prior Medications Reviewed Using: Patient Recall  Updated Prior Medication List: LEVOTHROID 112 MCG  TABS (LEVOTHYROXINE SODIUM) One daily LISINOPRIL-HYDROCHLOROTHIAZIDE 20-12.5 MG TABS (LISINOPRIL-HYDROCHLOROTHIAZIDE) once daily ASPIRIN 81 MG TBEC (ASPIRIN) once daily XYZAL 5 MG TABS (LEVOCETIRIZINE DIHYDROCHLORIDE) One by mouth daily NEURONTIN 100 MG CAPS (GABAPENTIN) One at bedtime LIPITOR 20 MG  TABS (ATORVASTATIN CALCIUM) One daily PRILOSEC 20 MG  CPDR (OMEPRAZOLE) 1 by mouth q day NEURONTIN 100 MG  CAPS (GABAPENTIN) 1 by mouth three times a day NABUMETONE 500 MG  TABS (NABUMETONE) two times a day * STERAPRED 10MG  DOSE PACK FOR 12 DAYS as directed NEURONTIN 300 MG  CAPS (GABAPENTIN) one by mouth tid CIPRO 500 MG  TABS (CIPROFLOXACIN HCL) two times a day PYRIDIUM 200 MG  TABS (PHENAZOPYRIDINE HCL) three times a day LORTAB 5 5-500 MG  TABS (HYDROCODONE-ACETAMINOPHEN) 1/2 q 4 hrs prn * STANDARD CANE to use while walking  Current Allergies (reviewed today): ! DARVON    Risk Factors: Tobacco use:  quit    Year quit:  1987    Pack-years:  27 Drug use:  no Alcohol use:  no  Family History Risk Factors:    Family History of MI in females < 13 years old:  no    Family History of MI in males < 22 years old:  no  Colonoscopy History:    Date of Last Colonoscopy:  06/23/2006  Mammogram History:    Date of Last Mammogram:  07/11/2007  PAP Smear History:    Date of Last PAP Smear:  10/29/2006   Review of Systems      See HPI   Physical Exam  General:     Well-developed,well-nourished,in no acute distress; alert,appropriate and cooperative  throughout examination. Appears tired and worn out. Walks with cane. Lungs:     Normal respiratory effort, chest expands symmetrically. Lungs are clear to auscultation, no crackles or wheezes. Heart:     Normal rate and regular rhythm. S1 and S2 normal without gallop, murmur, click, rub or other extra sounds. Abdomen:     Bowel sounds positive,abdomen soft and non-tender without masses, organomegaly or hernias noted. Extremities:     No clubbing, cyanosis, edema, or deformity noted with normal full range of motion of all joints.   Neurologic:     Back pain and sitting upright on table with grimmace when asked to lie down. Has paraspinous mucle spasm and tender C-spine, L-spine and T-spine. Neg SLT to 60 degrees. Reflexes 2 + and symmetric - perhaps mildly decreased lower legs. Sensation grossly intact. Cervical Nodes:  No lymphadenopathy noted Psych:     Cognition and judgment appear intact. Alert and cooperative with normal attention span and concentration. No apparent delusions, illusions, hallucinations    Impression & Recommendations:  Problem # 1:  SPINAL STENOSIS, CERVICAL (ICD-723.0) Sx of radiculopathy to right arm. Rx as per Dr. Romeo Apple for L-spine stenosis.  Advised referal to Neurosurgery for neck eval and agreeable. Orders: Neurosurgeon Referral (Neurosurgeon)   Problem # 2:  SPINAL STENOSIS, LUMBAR (ICD-724.02) Cont Rx as per Dr. Romeo Apple and await appt for epidurals. Advised tocontact his office on Monday if has not heard about appt yet.  Problem # 3:  HYPERLIPIDEMIA (ICD-272.4) Unable to afford Lipitor. DC and start $4 Pravachol. Check LFTS in 6 weeks. Her updated medication list for this problem includes:    Pravachol 40 Mg Tabs (Pravastatin sodium) ..... One daily  Orders: T-Comprehensive Metabolic Panel ([id number])   Problem # 4:  HYPERTENSION (ICD-401.9) Stable. Rx as is. Check renal fucntion and lytes. Sx of palpitations and worsening GERD on steroid  Rx. Start PPI in form of Prononix 40 mg daily and reassured about angst.  Her updated medication list for this problem includes:    Lisinopril-hydrochlorothiazide 20-12.5 Mg Tabs (Lisinopril-hydrochlorothiazide) ..... Once daily  Orders: T-Comprehensive Metabolic Panel ([id number])   Problem # 5:  GERD (ICD-530.81) See above. The following medications were removed from the medication list:    Prilosec 20 Mg Cpdr (Omeprazole) .Marland Kitchen... 1 by mouth q day   Problem # 6:  HYPOTHYROIDISM (ICD-244.9) Check TSH with palpitations though doubt thryoid related. Await result and optomize. Her updated medication list for this problem includes:    Levothroid 112 Mcg Tabs (Levothyroxine sodium) ..... One daily  Orders: T-TSH ([id number])   Complete Medication List: 1)  Levothroid 112 Mcg Tabs (Levothyroxine sodium) .... One daily 2)  Lisinopril-hydrochlorothiazide 20-12.5 Mg Tabs (Lisinopril-hydrochlorothiazide) .... Once daily 3)  Aspirin 81 Mg Tbec (Aspirin) .... Once daily 4)  Pravachol 40 Mg Tabs (Pravastatin sodium) .... One daily 5)  Sterapred 10mg  Dose Pack For 12 Days  .... As directed 6)  Neurontin 300 Mg Caps (Gabapentin) .... One by mouth tid 7)  Lortab 5 5-500 Mg Tabs (Hydrocodone-acetaminophen) .... 1/2 q 4 hrs prn 8)  Standard Cane  .... To use while walking  Other Orders: T-CBC w/Diff ([id number])  Hypertension Assessment/Plan:      The patient's hypertensive risk group is category B: At least one risk factor (excluding diabetes) with no target organ damage.  Her calculated 10 year risk of coronary heart disease is 7 %.  Today's blood pressure is 137/83.  Her blood pressure goal is < 140/90.  Lipid Assessment/Plan:      Based on NCEP/ATP III, the patient's risk factor category is "2 or more risk factors and a calculated 10 year CAD risk of < 20%".  From this information, the patient's calculated lipid goals are as follows: Total cholesterol goal is 200; LDL cholesterol goal is  130; HDL cholesterol goal is 40; Triglyceride goal is 150.     Patient Instructions: 1)  Please schedule a follow-up appointment in 6 weeks.   Prescriptions: PRAVACHOL 40 MG  TABS (PRAVASTATIN SODIUM) One daily  #30 x 3   Entered and Authorized by:   Franchot Heidelberg MD   Signed by:   Franchot Heidelberg MD on 04/13/2008   Method used:   Faxed to ...       [facility]  [address]       [address]  [place], [place]  [address]       Ph: [phone]       Fax: [phone]   RxID:   [id number]  ]

## 2010-10-21 NOTE — Assessment & Plan Note (Signed)
Summary: 2 week follow up/arc   Vital Signs:  Patient profile:   65 year old female Height:      66 inches Weight:      233 pounds BMI:     37.74 O2 Sat:      100 % Temp:     98.1 degrees F Pulse rate:   72 / minute Resp:     14 per minute BP sitting:   133 / 84  Vitals Entered By: Sherilyn Banker LPN (February 21, 1609 10:29 AM)  Nutrition Counseling: Patient's BMI is greater than 25 and therefore counseled on weight management options. CC: follow-up visit, pt had palpataions again yesterday, none today, Hypertension Management Nutritional Status BMI of > 30 = obese   Primary Mitchell Epling:  [name]  CC:  follow-up visit, pt had palpataions again yesterday, none today, and Hypertension Management.  History of Present Illness: Pt in for recheck.  She has had paplpitations. She states comes and goes. Not daily either. States when she has them heart pounds. She notes symptoms for 15  minutes  and then clears. On and off for 2 to 3 hours. Spouse present and states checked HR with bout and it was 66. She denies chest pain, orthopnea and leg swelling. States does feel little winded with bouts and notes not sweaty or clammy. SHe denies cough and wheeze. She has good apetite. She does et heartburn. She is out of Kapidex and needs samples if we have them. She denies abdominal pain, nausea and vomitting. Denies diarrhea or contipation. States no bloody stools.  She had a normal EKG last visit and this holter was ordered:   24-hour Holter monitoring was worn from 11:39:09 on May 24 through   11:31:09 on May 25.  Sinus rhythm is noted throughout with an average   heart rate of 94 beats per minute (peak heart rate of a 119 and minimum   heart rate of 53).  Rare-to-occasional premature atrial complexes were   noted without any sustained arrhythmias.  Lead motion artifact is noted.   There were no premature ventricular complexes or pauses.  No clear   correlation with diary entries.       She also had labs anf this is reviewed:  1. CMP - ALT 37. Improved. 2. TSH - 6.451. Rx adjustment made and states has used daily.   She now presents.   Hypertension History:      Positive major cardiovascular risk factors include female age 5 years old or older, hyperlipidemia, and hypertension.  Negative major cardiovascular risk factors include no history of diabetes, negative family history for ischemic heart disease, and non-tobacco-user status.        Further assessment for target organ damage reveals no history of ASHD, stroke/TIA, or peripheral vascular disease.     Current Problems (verified): 1)  Palpitations  (ICD-785.1) 2)  Dysuria  (ICD-788.1) 3)  Liver Function Tests, Abnormal, Hx of  (ICD-V12.2) 4)  Morbid Obesity  (ICD-278.01) 5)  Back Pain  (ICD-724.5) 6)  Anserine Bursitis  (ICD-726.61) 7)  Gerd  (ICD-530.81) 8)  Benign Positional Vertigo  (ICD-386.11) 9)  H N P-lumbar  (ICD-722.10) 10)  Spinal Stenosis, Cervical  (ICD-723.0) 11)  Spinal Stenosis, Lumbar  (ICD-724.02) 12)  Patellar Tendinitis  (ICD-726.64) 13)  ? of Sleep Apnea  (ICD-780.57) 14)  Tear Lateral Meniscus  (ICD-836.1) 15)  Obesity Nos  (ICD-278.00) 16)  Allergic Rhinitis, Seasonal  (ICD-477.0) 17)  Fibrocystic Breast Disease  (ICD-610.1) 18)  Degeneration, Disc Nos - C-spine and L-spine  (ICD-722.6) 19)  Overactive Bladder  (ICD-596.51) 20)  Hx of Pud  (ICD-533.90) 21)  Osteoarthritis  (ICD-715.90) 22)  Hypothyroidism  (ICD-244.9) 23)  Hypertension  (ICD-401.9) 24)  Hyperlipidemia  (ICD-272.4) 25)  Gerd  (ICD-530.81) 26)  Diverticulosis, Colon  (ICD-562.10)  Current Medications (verified): 1)  Levothroid 112 Mcg Tabs (Levothyroxine Sodium) .... Take 1 Tablet By Mouth Once A Day 2)  Lisinopril-Hydrochlorothiazide 20-12.5 Mg Tabs (Lisinopril-Hydrochlorothiazide) .... Two Daily 3)  Aspirin 81 Mg Tbec (Aspirin) .... Once Daily 4)  Pravachol 40 Mg  Tabs (Pravastatin Sodium) .... One Daily 5)   Flexeril 10 Mg Tabs (Cyclobenzaprine Hcl) .Marland Kitchen.. 1 By Mouth Three Times A Day As Needed 6)  Neurontin 600 Mg Tabs (Gabapentin) .Marland Kitchen.. 1 Tablet Three Times A Day 7)  Naprosyn 500 Mg Tabs (Naproxen) .... Two Times A Day 8)  Vicodin 5-500 Mg Tabs (Hydrocodone-Acetaminophen) .... One Daily For 6 Weeks 9)  Pyridium 200 Mg Tabs (Phenazopyridine Hcl) .... One Three Times A Day 10)  Kapidex 60 Mg Cpdr (Dexlansoprazole) .... Once Daily  Allergies (verified): 1)  ! Darvon  Past History:  Past Medical History: Last updated: 05/29/2008 Current Problems:  TRANSAMINASES, SERUM, ELEVATED (ICD-790.4) H N P-LUMBAR (ICD-722.10) CYSTITIS, ACUTE (ICD-595.0) SPINAL STENOSIS, CERVICAL (ICD-723.0) SPINAL STENOSIS, LUMBAR (ICD-724.02) PATELLAR TENDINITIS (ICD-726.64) KNEE PAIN (ICD-719.46) ARM PAIN, LEFT (ICD-729.5) ANSERINE BURSITIS, RIGHT (ICD-726.61) ? of SLEEP APNEA (ICD-780.57) TEAR LATERAL MENISCUS (ICD-836.1) OBESITY NOS (ICD-278.00) ALLERGIC RHINITIS, SEASONAL (ICD-477.0) FIBROCYSTIC BREAST DISEASE (ICD-610.1) DEGENERATION, DISC NOS (ICD-722.6) OVERACTIVE BLADDER (ICD-596.51) PUD (ICD-533.90) OSTEOARTHRITIS (ICD-715.90) HYPOTHYROIDISM (ICD-244.9) HYPERTENSION (ICD-401.9) HYPERLIPIDEMIA (ICD-272.4) GERD (ICD-530.81) DIVERTICULOSIS, COLON (ICD-562.10)  Past Surgical History: Last updated: 01/05/2009 1. Hysterectomy 2. Lumpectomy 3. Tonsillectomy 4. Right knee arthroscopy August 2008 - Dr. Romeo Apple 5. Stress Myoview: 08/29/07 - EF 82% - no ischemia  Family History: Last updated: 01/05/09 Father: Dead 72 CAD Mother: Dead  at 61 PE post shoulder surgery Brothers - 2x - age 48  DDD, DJD knees and 60 - healthy Sisters - 3 sisters - age 62 CAD and CVA and DDD, 41 DDD, 73 ? 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 No distress.  Lungs:  Normal respiratory effort, chest expands symmetrically. Lungs are clear to auscultation, no crackles or wheezes. Heart:  Normal rate and regular rhythm. S1 and S2 normal without gallop, murmur, click, rub or other extra sounds. Abdomen:  Bowel sounds positive,abdomen soft and non-tender without masses, organomegaly or hernias noted. Extremities:  No redness, swelling or distress. Psych:  Cognition and judgment appear intact. Alert and cooperative with normal attention span and concentration. No apparent delusions, illusions, hallucinations   Impression & Recommendations:  Problem # 1:  PALPITATIONS (ICD-785.1) Discussed. Suspect hypothyroidism and PAC as cause. Discussed optomization of synthroid Rx and need for low dose B-blocker. Councelled method of use, risk and benefit. I reviewed the [facility] handout on palpitations and discussed avoidance of excessive caffeine, stress etc. Recheck 4 weeks and if persist, refer Cardiology - sooner if worse. Her updated medication list for  this problem includes:    Metoprolol Tartrate 25 Mg Tabs (Metoprolol tartrate) ..... One two times a day  Problem # 2:  LIVER FUNCTION TESTS, ABNORMAL, HX OF (ICD-V12.2) Mild elevation ALT.Suspect fatty liver. Lose weight. Councelled diet, exersize and low fat diet.  Problem # 3:  MORBID OBESITY (ICD-278.01) Discussed. See above. Councelled portion control, of daily exersize.  Problem # 4:  HYPOTHYROIDISM (ICD-244.9) On higher dose. Repeat TSH in 4 weeks. Her updated medication list for this problem includes:    Levothroid 112 Mcg Tabs (Levothyroxine sodium) .Marland Kitchen... Take 1 tablet by mouth once a day  Complete Medication List: 1)  Levothroid 112 Mcg Tabs  (Levothyroxine sodium) .... Take 1 tablet by mouth once a day 2)  Lisinopril-hydrochlorothiazide 20-12.5 Mg Tabs (Lisinopril-hydrochlorothiazide) .... Two daily 3)  Aspirin 81 Mg Tbec (Aspirin) .... Once daily 4)  Pravachol 40 Mg Tabs (Pravastatin sodium) .... One daily 5)  Flexeril 10 Mg Tabs (Cyclobenzaprine hcl) .Marland Kitchen.. 1 by mouth three times a day as needed 6)  Neurontin 600 Mg Tabs (Gabapentin) .Marland Kitchen.. 1 tablet three times a day 7)  Naprosyn 500 Mg Tabs (Naproxen) .... Two times a day 8)  Vicodin 5-500 Mg Tabs (Hydrocodone-acetaminophen) .... One daily for 6 weeks 9)  Pyridium 200 Mg Tabs (Phenazopyridine hcl) .... One three times a day 10)  Kapidex 60 Mg Cpdr (Dexlansoprazole) .... Once daily 11)  Metoprolol Tartrate 25 Mg Tabs (Metoprolol tartrate) .... One two times a day  Hypertension Assessment/Plan:      The patient's hypertensive risk group is category B: At least one risk factor (excluding diabetes) with no target organ damage.  Her calculated 10 year risk of coronary heart disease is 9 %.  Today's blood pressure is 133/84.  Her blood pressure goal is < 140/90.  Patient Instructions: 1)  Please schedule a follow-up appointment in 1 month. Prescriptions: METOPROLOL TARTRATE 25 MG TABS (METOPROLOL TARTRATE) One two times a day  #60 x 0   Entered and Authorized by:   Franchot Heidelberg MD   Signed by:   Franchot Heidelberg MD on 02/21/2009   Method used:   Electronically to        [facility] [facility] (retail)       [address]       [place], [place]  [address]       Ph: [phone]       Fax: [phone]   RxID:   [id number]

## 2010-10-21 NOTE — Letter (Signed)
Summary: visit list  visit list   Imported By: Altamease Oiler 06/05/2009 13:44:17  _____________________________________________________________________  External Attachment:    Type:   Image     Comment:   External Document

## 2010-10-21 NOTE — Progress Notes (Signed)
Summary: MRI APPOINTMENT.  Phone Note Outgoing Call   Call placed by: Waldon Reining,  March 27, 2008 9:24 AM Call placed to: Patient Action Taken: Phone Call Completed, Appt scheduled Summary of Call: I CALLED AND GAVE PATIENT HER MRI APPOINTMENT AT [place] ON 03-27-08 AT 2:00, 1:30 TO REGISTER. PATIENT HAS [facility] DISCOUNT, NO PRECERT IS NEEDED. PATIENT HAS AN APPOINTMENT TO FOLLOW UP BACK HERE TO GO OVER HER RESULTS.

## 2010-10-21 NOTE — Assessment & Plan Note (Signed)
Summary: 6 wk f/u   Vital Signs:  Patient Profile:   65 Years Old Female Height:     66 inches (167.64 cm) Weight:      208 pounds BMI:     33.69 O2 Sat:      96 % Pulse rate:   71 / minute Resp:     16 per minute BP sitting:   125 / 89  Vitals Entered By: Sherilyn Banker (April 13, 2007 9:03 AM)               PCP:  [name]  Chief Complaint:  R knee pain and Swollen area on L side of neck causing concern.  History of Present Illness: Pt in for recheck.  She was diganosed with UTI on last visit and notes this has done well. She denies burning, stinging and bad smells. She denies fever, chills and sweats. Not really drinking cranberry juice.  She also had labs done and this is reviewed:  1. CBC - normal 2. CMP - normal 3. TSH - normal 4. Lipids - see below.  She is still struggling with right knee pain. She sees Dr. Romeo Apple from Ortho for this and notes she has appt with him next week. He started her on high dose Ibuprofen 800 mg by mouth three times a day for 10 days. Helped a lot. Now done and pain coming back.   She has a fullness over her left neck base. She has noticed it for two days. She has no pain and just thinks she sees a knot. No lumps or bumps. No firmness or redness. No trauma. She denies chest pain, shortness of breath and she has been coughing at night. States this is productive with phlegm. Not sure about color. She denies wheezing. She snores a lot. She gags and has scared her husband. She is obese. She is tired. Notes occasional headaches.  Apetite is good. No nausea or vomitting. Denies diarrhea and constipation. Has colonoscopy result documented based on hx from patient - admits today she never went when questioned why we have not been able to get results.  Niow presents.  Hypertension History:      She notes no problems with any antihypertensive medication side effects.        Positive major cardiovascular risk factors include female age 35  years old or older, hyperlipidemia, and hypertension.  Negative major cardiovascular risk factors include no history of diabetes, negative family history for ischemic heart disease, and non-tobacco-user status.        Further assessment for target organ damage reveals no history of ASHD, stroke/TIA, or peripheral vascular disease.    Lipid Management History:      Positive NCEP/ATP III risk factors include female age 40 years old or older and hypertension.  Negative NCEP/ATP III risk factors include no history of early menopause without estrogen hormone replacement, non-diabetic, no family history for ischemic heart disease, non-tobacco-user status, no ASHD (atherosclerotic heart disease), no prior stroke/TIA, no peripheral vascular disease, and no history of aortic aneurysm.        The patient states that she knows about the "Therapeutic Lifestyle Change" diet.  Her compliance with the TLC diet is fair.  Adjunctive measures started by the patient include aerobic exercise and ASA.  Comments: Agrees with med - note poor with diet and exersize.    Current Allergies (reviewed today): ! DARVON  Past Medical History:    Reviewed history from 09/07/2006 and no changes required:  Diverticulosis, colon       GERD       Hyperlipidemia       Hypertension       Hypothyroidism       Osteoarthritis  Past Surgical History:    Reviewed history from 09/07/2006 and no changes required:       Hysterectomy       Lumpectomy       Tonsillectomy      Physical Exam  General:     Well-developed,well-nourished,in no acute distress; alert,appropriate and cooperative throughout examination Neck:     No deformities, masses, or tenderness noted. Chest Wall:     MIld fullness left neck and trunk junction. No mass, nodules or pain Lungs:     Normal respiratory effort, chest expands symmetrically. Lungs are clear to auscultation, no crackles or wheezes. Heart:     Normal rate and regular rhythm. S1  and S2 normal without gallop, murmur, click, rub or other extra sounds. Abdomen:     Bowel sounds positive,abdomen soft and non-tender without masses, organomegaly or hernias noted. Psych:     Cognition and judgment appear intact. Alert and cooperative with normal attention span and concentration. No apparent delusions, illusions, hallucinations    Impression & Recommendations:  Problem # 1:  OBESITY NOS (ICD-278.00) Councelled diet, exersize and portion control. Follow.  Problem # 2:  HYPERLIPIDEMIA (ICD-272.4) Start Lipitor. Councelled risk and benefit. Agrees. TLC a must. Her updated medication list for this problem includes:    Lipitor 20 Mg Tabs (Atorvastatin calcium) ..... One daily   Problem # 3:  HYPERTENSION (ICD-401.9) BP at goal. Councelled diet, exersize and low salt intake. Meds as is. Her updated medication list for this problem includes:    Lisinopril-hydrochlorothiazide 20-12.5 Mg Tabs (Lisinopril-hydrochlorothiazide) ..... Once daily   Problem # 4:  HYPOTHYROIDISM (ICD-244.9) TSH at goal. Meds as is. Her updated medication list for this problem includes:    Levothyroxine Sodium 100 Mcg Tabs (Levothyroxine sodium) .Marland Kitchen... 1 by mouth once daily   Problem # 5:  SYMPTOM, COUGH (ICD-786.2) CXR. Monitor neck base fullness. If worse, consider soft tissue ultrasound. Await CXR and optomize. Sx of sleep apnea  as well - refer sleep study. Orders: T-Chest x-ray, 2 views ([id number])   Problem # 6:  Preventive Health Care (ICD-V70.0) Never went for colonoscopy. Advised need for this. Agreeable. Nurse to refer for screening. Advised honesty best policy.  Medications Added to Medication List This Visit: 1)  Lipitor 20 Mg Tabs (Atorvastatin calcium) .... One daily  Hypertension Assessment/Plan:      The patient's hypertensive risk group is category B: At least one risk factor (excluding diabetes) with no target organ damage.  Her calculated 10 year risk of coronary heart  disease is 9 %.  Today's blood pressure is 125/89.  Her blood pressure goal is < 140/90.  Lipid Assessment/Plan:      Based on NCEP/ATP III, the patient's risk factor category is "2 or more risk factors and a calculated 10 year CAD risk of < 20%".  From this information, the patient's calculated lipid goals are as follows: Total cholesterol goal is 200; LDL cholesterol goal is 130; HDL cholesterol goal is 40; Triglyceride goal is 150.     Patient Instructions: 1)  Please schedule a follow-up appointment in 6 weeks.

## 2010-10-21 NOTE — Assessment & Plan Note (Signed)
Summary: FOLLOW-UP FROM PREVIOUS APPOINTMENT   Vital Signs:  Patient Profile:   65 Years Old Female Height:     66 inches (167.64 cm) Weight:      208 pounds BMI:     33.69 O2 Sat:      98 % Temp:     97.4 degrees F Pulse rate:   82 / minute Resp:     14 per minute BP sitting:   132 / 82  Vitals Entered By: Sherilyn Banker (July 11, 2007 10:35 AM)                 PCP:  [name]  Chief Complaint:  follow up visit.  History of Present Illness: Pt in for recheck.  She was recently seen for RUQ pain. She notes she used Prevacid for two weeks, stopped Ibuprofen and other NSAID use and has not really had much further sx. Labs from last visit are reviewed. Showed elevated Amylase and stable LFTs. She had a gallbladder US and this was normal. She denies nausea, vomitting, diarrhea and constipation. No blood in stool. Does not want further eval.  Pt requests flu-shot.  Now presents.  Hypertension History:      She denies headache, chest pain, palpitations, dyspnea with exertion, orthopnea, PND, peripheral edema, visual symptoms, neurologic problems, syncope, and side effects from treatment.  She notes no problems with any antihypertensive medication side effects.        Positive major cardiovascular risk factors include female age 35 years old or older, hyperlipidemia, and hypertension.  Negative major cardiovascular risk factors include no history of diabetes, negative family history for ischemic heart disease, and non-tobacco-user status.        Further assessment for target organ damage reveals no history of ASHD, stroke/TIA, or peripheral vascular disease.    Lipid Management History:      Positive NCEP/ATP III risk factors include female age 59 years old or older and hypertension.  Negative NCEP/ATP III risk factors include no history of early menopause without estrogen hormone replacement, non-diabetic, no family history for ischemic heart disease, non-tobacco-user  status, no ASHD (atherosclerotic heart disease), no prior stroke/TIA, no peripheral vascular disease, and no history of aortic aneurysm.        The patient states that she knows about the "Therapeutic Lifestyle Change" diet.  Her compliance with the TLC diet is fair.  The patient expresses understanding of adjunctive measures for cholesterol lowering.  Adjunctive measures started by the patient include aerobic exercise, fiber, ASA, and omega-3 supplements.  She expresses no side effects from her lipid-lowering medication.  The patient denies any symptoms to suggest myopathy or liver disease from her "statin" therapy.  Comments: Tolerating Lipitor. No side-effects. Due for lipid recheck.    Current Allergies (reviewed today): ! DARVON  Past Medical History:    Reviewed history from 09/07/2006 and no changes required:       Diverticulosis, colon       GERD       Hyperlipidemia       Hypertension       Hypothyroidism       Osteoarthritis  Past Surgical History:    Reviewed history from 05/25/2007 and no changes required:       Hysterectomy       Lumpectomy       Tonsillectomy       Right knee arthroscopy August 2008 - Dr. Romeo Apple   Family History:    Reviewed history from 10/29/2006 and  no changes required:       Father: Dead 46 CAD       Mother: Dead  at 40 PE post shoulder surgery       Siblings:   Social History:    Reviewed history from 10/29/2006 and no changes required:       Married       Alcohol use-seldom       Drug use-no       Former Smoker   Risk Factors:  Mammogram History:     Date of Last Mammogram:  07/11/2007    Results:  normal   Review of Systems      See HPI  General      Denies fatigue and malaise.  Resp      Denies cough, shortness of breath, sputum productive, and wheezing.  GI      Denies abdominal pain, constipation, diarrhea, nausea, and vomiting.  GU      Denies abnormal vaginal bleeding, nocturia, urinary frequency, and urinary  hesitancy.  MS      Knee pain - right resolved post steroid injection by Dr. Romeo Apple. May use single Tylenol now and then for pain relief if discomfort.   Physical Exam  General:     Well-developed,well-nourished,in no acute distress; alert,appropriate and cooperative throughout examination Lungs:     Normal respiratory effort, chest expands symmetrically. Lungs are clear to auscultation, no crackles or wheezes. Heart:     Normal rate and regular rhythm. S1 and S2 normal without gallop, murmur, click, rub or other extra sounds. Abdomen:     Bowel sounds positive,abdomen soft and non-tender without masses, organomegaly or hernias noted.  No surgical scars to suggest lap choly - does have hx scar lower mid abdomen Extremities:     No redness or swelling. Neurologic:     No cranial nerve deficits noted. Station and gait are normal. Plantar reflexes are down-going bilaterally. DTRs are symmetrical throughout. Sensory, motor and coordinative functions appear intact. Psych:     Cognition and judgment appear intact. Alert and cooperative with normal attention span and concentration. No apparent delusions, illusions, hallucinations    Impression & Recommendations:  Problem # 1:  HYPERLIPIDEMIA (ICD-272.4) Discussed. Check labs to assure to goal. Councelled TLC, weight loss and healthy diet. Her updated medication list for this problem includes:    Lipitor 20 Mg Tabs (Atorvastatin calcium) ..... One daily  Orders: T-Comprehensive Metabolic Panel [id number]) T-Lipid Profile ([id number])   Problem # 2:  RUQ PAIN (ICD-789.01) Resolved. Monitor. If re-occur consider CT abd and pelvis vs HIDA scan.  Problem # 3:  KNEE PAIN, RIGHT (ICD-719.46) Resolved. Optomize per Dr. Romeo Apple. Avoid NSAIDS given hx of PUD. Tylenol as needed up to 4 g in 24 hours. Instructions given. Her updated medication list for this problem includes:    Aspirin 81 Mg Tbec (Aspirin) ..... Once daily   Problem  # 4:  HYPERTENSION (ICD-401.9) Stable. Meds as is. Low salt intake and exersize a must. Her updated medication list for this problem includes:    Lisinopril-hydrochlorothiazide 20-12.5 Mg Tabs (Lisinopril-hydrochlorothiazide) ..... Once daily  Orders: T-Comprehensive Metabolic Panel ([id number])   Problem # 5:  Preventive Health Care (ICD-V70.0) Flu-shot advised. Agreeable. Advised risk and benefit. Follow-up if any concern.  Complete Medication List: 1)  Levothyroxine Sodium 100 Mcg Tabs (Levothyroxine sodium) .Marland Kitchen.. 1 by mouth once daily 2)  Lisinopril-hydrochlorothiazide 20-12.5 Mg Tabs (Lisinopril-hydrochlorothiazide) .... Once daily 3)  Aspirin 81 Mg Tbec (Aspirin) .... Once daily 4)  Xyzal 5 Mg Tabs (Levocetirizine dihydrochloride) .... One by mouth daily 5)  Neurontin 100 Mg Caps (Gabapentin) .... One at bedtime 6)  Lipitor 20 Mg Tabs (Atorvastatin calcium) .... One daily  Other Orders: Flu Vaccine 33yrs + (16109) Admin 1st Vaccine (60454)  Hypertension Assessment/Plan:      The patient's hypertensive risk group is category B: At least one risk factor (excluding diabetes) with no target organ damage.  Her calculated 10 year risk of coronary heart disease is 9 %.  Today's blood pressure is 132/82.  Her blood pressure goal is < 140/90.  Lipid Assessment/Plan:      Based on NCEP/ATP III, the patient's risk factor category is "2 or more risk factors and a calculated 10 year CAD risk of < 20%".  From this information, the patient's calculated lipid goals are as follows: Total cholesterol goal is 200; LDL cholesterol goal is 130; HDL cholesterol goal is 40; Triglyceride goal is 150.     Patient Instructions: 1)  Please schedule a follow-up appointment in 3 months.    ]  Preventive Care Screening  Mammogram:    Date:  07/11/2007    Next Due:  07/2008    Results:  normal  Last Flu Shot:    Date:  07/11/2007    Next Due:  07/2008    Results:  given    Influenza  Vaccine    Vaccine Type: Fluvax 3+    Site: right deltoid    Dose: 0.5 ml    Route: IM    Given by: Sherilyn Banker    Exp. Date: 01/20/2008    VIS given: 03/20/05 version given July 11, 2007.  Flu Vaccine Consent Questions    Do you have a history of severe allergic reactions to this vaccine? no    Any prior history of allergic reactions to egg and/or gelatin? no    Do you have a sensitivity to the preservative Thimersol? no    Do you have a past history of Guillan-Barre Syndrome? no    Do you currently have an acute febrile illness? no    Have you ever had a severe reaction to latex? no    Vaccine information given and explained to patient? yes    Are you currently pregnant? no

## 2010-10-21 NOTE — Progress Notes (Signed)
Summary: 06/27/07 mammo  Phone Note Outgoing Call   Call placed by: Sonny Dandy,  June 28, 2007 2:26 PM Summary of Call: # disconnected, letter mailed Initial call taken by: Sonny Dandy,  June 28, 2007 2:26 PM

## 2010-10-21 NOTE — Progress Notes (Signed)
Summary: Wants referal for ESI instead of Neurosurgery consult.  Phone Note Call from Patient   Reason for Call: Talk to Nurse Summary of Call: patient is getting ESI's (epidural steroid injection) done at [facility] for her low back and wants to know if Dr Erby Pian can refer her to get ESI in her neck so she can get them done there as well, instead of going to [place] neuro[facility].Marland KitchenMarland KitchenMarland KitchenWhere they don't take the [facility] discount.... Initial call taken by: Donneta Romberg,  April 23, 2008 2:55 PM  Follow-up for Phone Call        Agree. DDD C-spine with right arm radiculopathy. Not open to seeing Neurosurgery as cannot afford. Follow-up by: Franchot Heidelberg MD,  April 23, 2008 4:33 PM  Additional Follow-up for Phone Call Additional follow up Details #1::        Referal faxed to [facility] imaging. They will call her and see if she wantsto switch the appt she has for the injection in her back to an inj. in her neck becase she can only get so many inj. in a 3 month period. Additional Follow-up by: Sherilyn Banker,  April 24, 2008 9:10 AM

## 2010-10-21 NOTE — Progress Notes (Signed)
Summary: call from patient  Phone Note Call from Patient   Caller: Patient Summary of Call: Patient called on 02/24/10, to request appointment re: her back and neck.  States has Medicare as of recently.  Advised, due to various symptoms relayed by patient, to contact her primary care physician - she states sees Dr Felecia Shelling. Initial call taken by: Cammie Sickle,  February 25, 2010 3:53 PM  Follow-up for Phone Call        called back 02/25/10 to relay that she saw Dr Felecia Shelling today and is being ref'd to a neurosurgeon. Follow-up by: Cammie Sickle,  February 25, 2010 4:00 PM

## 2010-10-21 NOTE — Letter (Signed)
Summary: Holter monitor  Holter monitor   Imported By: Curtis Sites 02/27/2009 15:19:19  _____________________________________________________________________  External Attachment:    Type:   Image     Comment:   External Document

## 2010-10-21 NOTE — Progress Notes (Signed)
Summary: cardiology referral  Phone Note Outgoing Call   Call placed by: Sonny Dandy,  August 15, 2007 2:36 PM Summary of Call: appointment set for Dr Lisbeth Ply 08/25/07 at 900am, records faxed and patient notified. Initial call taken by: Sonny Dandy,  August 15, 2007 2:37 PM

## 2010-10-21 NOTE — Assessment & Plan Note (Signed)
Summary: RE-CK RT KNEE FOL'G INJEC/[employer]/CAF    PCP:  [name]   History of Present Illness: I saw Sara [name] in the office today for a followup visit.  She is a 65 years old woman with the complaint of:  right knee pain  She c/o knee pain anterior  Has a h/o ulcer but no active ulcer at this time.    Current Allergies: ! DARVON     Review of Systems  MS      Complains of joint pain.      left knee pain and aching with weather changes    Physical Exam  Psych:     alert and cooperative; normal mood and affect; normal attention span and concentration   Knee Exam  General:    Well-developed, well-nourished, normal body habitus; no deformities, normal grooming.  Gait:    Normal heel-toe gait pattern bilaterally.    Skin:    Intact, no scars, lesions, rashes, cafe au lait spots, or bruising.    Inspection:     No deformity, ecchymosis or swelling.   Palpation:    tenderness R-patellar tendon tenderness R-Parapatellar.    Vascular:    There was no swelling or varicose veins. The pulses and temperature are normal. There was no edema or tenderness.  Sensory:    Gross coordination and sensation were normal.    Motor:    Motor strength 5/5 bilaterally for quadriceps, hamstrings, ankle dorsiflexion, and ankle plantar flexion.    Knee Exam:    Right:    Inspection:  Normal    Palpation:  Abnormal    Stability:  stable    Range of Motion:       Flexion-Active: full       Extension-Active: full    Impression & Recommendations:  Problem # 1:  PATELLAR TENDINITIS (NGE-952.84) Assessment: Unchanged  Orders: Est. Patient Level III (13244)   Problem # 2:  KNEE PAIN (ICD-719.46) Assessment: Improved   Discussed celebrex/relafen/ ulcer history/ fathers cardiac history/ decided to go with prilosec and relafen Her updated medication list for this problem includes:    Aspirin 81 Mg Tbec (Aspirin) ..... Once daily  Orders: Est. Patient  Level III (01027)   Medications Added to Medication List This Visit: 1)  Prilosec 20 Mg Cpdr (Omeprazole) .Marland Kitchen.. 1 by mouth q day   Patient Instructions: 1)  Please schedule a follow-up appointment in 1 month. 2)  If any problems with either medicaion call us immediately    Prescriptions: PRILOSEC 20 MG  CPDR (OMEPRAZOLE) 1 by mouth q day  #30 x 0   Entered and Authorized by:   Fuller Canada MD   Signed by:   Fuller Canada MD on 12/14/2007   Method used:   Print then Give to Patient   RxID:   [id number]  ]

## 2010-10-21 NOTE — Assessment & Plan Note (Signed)
Summary: 1 MO RE-CK KNEE/FOL'G MEDICATION/?MC DISC/CAF    PCP:  [name]   History of Present Illness: I saw Sara [name] in the office today for a followup visit.  She is a 64 years old woman with the complaint of:  right knee pain recheck after a month.  ROS: She has h/o patella tendonitis, s/p SARK, h/o bursitis s/p injection. She is on relafen and tylenol.  review of systems: She says her knee still hurts. She feels come pain in the front of the knee lateral side of the knee and the lateral thigh.           Updated Prior Medication List: LEVOTHYROXINE SODIUM 100 MCG TABS (LEVOTHYROXINE SODIUM) 1 by mouth once daily LISINOPRIL-HYDROCHLOROTHIAZIDE 20-12.5 MG TABS (LISINOPRIL-HYDROCHLOROTHIAZIDE) once daily ASPIRIN 81 MG TBEC (ASPIRIN) once daily XYZAL 5 MG TABS (LEVOCETIRIZINE DIHYDROCHLORIDE) One by mouth daily NEURONTIN 100 MG CAPS (GABAPENTIN) One at bedtime LIPITOR 20 MG  TABS (ATORVASTATIN CALCIUM) One daily PRILOSEC 20 MG  CPDR (OMEPRAZOLE) 1 by mouth q day NEURONTIN 100 MG  CAPS (GABAPENTIN) 1 by mouth three times a day  Current Allergies: ! DARVON  Past Medical History:    Reviewed history from 09/07/2006 and no changes required:       Diverticulosis, colon       GERD       Hyperlipidemia       Hypertension       Hypothyroidism       Osteoarthritis  Past Surgical History:    Reviewed history from 09/26/2007 and no changes required:       Hysterectomy       Lumpectomy       Tonsillectomy       Right knee arthroscopy August 2008 - Dr. Alison Murray: 08/29/07 - EF 82% - no ischemia      Physical Exam  Skin:     intact without lesions or rashes Psych:     alert and cooperative; normal mood and affect; normal attention span and concentration   Knee Exam  General:    Well-developed, well-nourished, normal body habitus; no deformities, normal grooming.  Gait:    Normal heel-toe gait pattern bilaterally.     Skin:    Intact, no scars, lesions, rashes, cafe au lait spots, or bruising.    Inspection:     No deformity, ecchymosis or swelling.   Vascular:    There was no swelling or varicose veins. The pulses and temperature are normal. There was no edema or tenderness.  Motor:    Motor strength 5/5 bilaterally for quadriceps, hamstrings, ankle dorsiflexion, and ankle plantar flexion.      Impression & Recommendations:  Problem # 1:  PATELLAR TENDINITIS (ICD-726.64) Assessment: Unchanged it seems this area still bothers her she complains of a prickly-like sensation across the front of the knee. We will continue the Relafen since it continues to control swelling.  Problem # 2:  KNEE PAIN (ICD-719.46) as above    Problem # 3:  ANSERINE BURSITIS, RIGHT (ICD-726.61) Assessment: Improved  Problem # 4:  DEGENERATION, DISC NOS (ICD-722.6) Assessment: Deteriorated I think her knee symptoms are atypical and I ordered a x-ray of her back it shows significant and severe degenerative disc changes at L3-4 and L4-5 there is severe facet arthritis as well. There is subtle spondylolisthesis of 3 on 4.  AP lateral and spot x-ray lumbar spine: There is degenerative disc disease as stated above impression degenerative disc disease.  Problem # 5:  DEGENERATION, DISC NOS (ICD-722.6)  Medications Added to Medication List This Visit: 1)  Neurontin 100 Mg Caps (Gabapentin) .Marland Kitchen.. 1 by mouth three times a day  Other Orders: Est. Patient Level III (29562) Lumbosacral Spine ,2/3 views (13086)   Patient Instructions: 1)  Please schedule a follow-up appointment in 1 month.    Prescriptions: NEURONTIN 100 MG  CAPS (GABAPENTIN) 1 by mouth three times a day  #60 x 0   Entered and Authorized by:   Fuller Canada MD   Signed by:   Fuller Canada MD on 01/16/2008   Method used:   Print then Give to Patient   RxID:   [id number]  ]

## 2010-10-21 NOTE — Letter (Signed)
Summary: sleep study  sleep study   Imported By: Donneta Romberg 08/16/2007 17:05:28  _____________________________________________________________________  External Attachment:    Type:   Image     Comment:   External Document

## 2010-10-21 NOTE — Assessment & Plan Note (Signed)
Summary: 2 WK RE-CK RT KNEE FOL'G MED/[employer]/CAF    PCP:  [name]   History of Present Illness: I saw Sara [name] in the office today for a followup visit.  She is a 65 years old woman with the complaint of:  right knee.  Today is her 2 week recheck on patellar tendonitis and sterapred dosepak.  The sterapred dosepak helped some, still has some pain in shin and around outer knee.  Pain level today around 2. Her pain  is constantly there. She does have some tingly sensations along knee where incisions were.    Current Allergies: ! DARVON       Knee Exam  General:    Well-developed, well-nourished, normal body habitus; no deformities, normal grooming.  Gait:    Normal heel-toe gait pattern bilaterally.    Skin:    Intact, no scars, lesions, rashes, cafe au lait spots, or bruising.    Vascular:    There was no swelling or varicose veins. The pulses and temperature are normal. There was no edema or tenderness.  Sensory:    Gross coordination and sensation were normal.    Motor:    Motor strength 5/5 bilaterally for quadriceps, hamstrings, ankle dorsiflexion, and ankle plantar flexion.    Knee Exam:    Right:    Inspection:  Abnormal    Palpation:  Abnormal       Location:  the portal sites     Stability:  stable    Swelling:  no    Range of Motion:       Flexion-Active: 125 degrees       Extension-Active: full    Impression & Recommendations:  Problem # 1:  PATELLAR TENDINITIS (ICD-726.64)  Orders: Est. Patient Level III (04540) Depo- Medrol 40mg  (J1030) Joint Aspirate / Injection, Large (98119) Verbal consent was obtained. The knee was prepped with alcohol and ethyl chloride. 1 cc of depomedrol 40mg /cc and 1 cc of sensorcaine .25% was injected. there were no complications. (both portal sites were injected)   Patient Instructions: 1)  You have received an injection of cortisone today. You may experience increased pain at the injection  site. Apply ice pack to the area for 20 minutes every 2 hours and take 2 xtra strength tylenol every 8 hours. This increased pain will usually resolve in 24 hours. The injection will take effect in 3-10 days.  2)  Please schedule a follow-up appointment in 3 weeks.    ]

## 2010-10-21 NOTE — Assessment & Plan Note (Signed)
Summary: follow up 2 month/slj   Vital Signs:  Patient profile:   65 year old female Height:      66 inches Weight:      230 pounds BMI:     37.26 O2 Sat:      96 % Temp:     97.9 degrees F Pulse rate:   81 / minute Resp:     14 per minute BP sitting:   128 / 88  Vitals Entered By: Sherilyn Banker LPN (May 23, 2009 10:56 AM) CC: follow-up visit, Hypertension Management   Primary Provider:  [name]  CC:  follow-up visit and Hypertension Management.  History of Present Illness: Pt in for recheck.  She has hypothyrodisim and had labs and this is reviewed:  1. CMP - normal 2. TSH - 0.203. Dose was decreased and she is just using 112 micrograms daily now. She has used this for about 4 weeks. She states she cannot see a difference. She states she cont with dry skin, tiredness and  brittle nails. She has never had a  Dexa. Agreeable with referal.  She states she is worried about right middle finger. Closed in car door in July and states has hurt when she bends it. STates put hard tape and small stick on it to splint it as it hurts to bend it. She has good sensation. She denies kin break and notes just hurts. S has not self treated except for Rx as per Ortho for her DDD.  SHe now presents.  Hypertension History:      Positive major cardiovascular risk factors include female age 70 years old or older, hyperlipidemia, and hypertension.  Negative major cardiovascular risk factors include no history of diabetes, negative family history for ischemic heart disease, and non-tobacco-user status.        Further assessment for target organ damage reveals no history of ASHD, stroke/TIA, or peripheral vascular disease.    Current Medications (verified): 1)  Levothroid 112 Mcg Tabs (Levothyroxine Sodium) .... Take One Tab Once Daily 2)  Lisinopril-Hydrochlorothiazide 20-12.5 Mg Tabs (Lisinopril-Hydrochlorothiazide) .... Two Daily 3)  Aspirin 81 Mg Tbec (Aspirin) .... Once  Daily 4)  Pravachol 40 Mg  Tabs (Pravastatin Sodium) .... One Daily 5)  Flexeril 10 Mg Tabs (Cyclobenzaprine Hcl) .Marland Kitchen.. 1 By Mouth Three Times A Day As Needed 6)  Neurontin 600 Mg Tabs (Gabapentin) .Marland Kitchen.. 1 Tablet Three Times A Day 7)  Kapidex 60 Mg Cpdr (Dexlansoprazole) .... Once Daily 8)  Metoprolol Tartrate 25 Mg Tabs (Metoprolol Tartrate) .... One Two Times A Day 9)  Lortab 5 5-500 Mg Tabs (Hydrocodone-Acetaminophen) .... One By Mouth Q 4 Hrs As Needed Pain  Allergies (verified): 1)  ! Darvon  Review of Systems      See HPI  Physical Exam  General:  Obese, well devloped. No distress.  Lungs:  Normal respiratory effort, chest expands symmetrically. Lungs are clear to auscultation, no crackles or wheezes. Heart:  Normal rate and regular rhythm. S1 and S2 normal without gallop, murmur, click, rub or other extra sounds. Extremities:  Right middle finger pain on flexion and cannot extend fully before reporting severe pain. NO rednss or swelling. Normal cap fill. Psych:  Cognition and judgment appear intact. Alert and cooperative with normal attention span and concentration. No apparent delusions, illusions, hallucinations   Impression & Recommendations:  Problem # 1:  HYPOTHYROIDISM (ICD-244.9) Repeat TSH end of Sept with new PCP. Get Dexa to eval for osteoporosis given disease state and increased  risk. Nurse to refer. Her updated medication list for this problem includes:    Levothroid 112 Mcg Tabs (Levothyroxine sodium) .Marland Kitchen... Take one tab once daily  Orders: Radiology Referral (Radiology)  Problem # 2:  FINGER PAIN - RIGHT MIDDLE (ICD-729.5) R/O fracture and optomize. Get plain films this am. Cont Hydrocodone for pain for now. Orders: T-DG Finger Middle*R* ([id number])  Complete Medication List: 1)  Levothroid 112 Mcg Tabs (Levothyroxine sodium) .... Take one tab once daily 2)  Lisinopril-hydrochlorothiazide 20-12.5 Mg Tabs (Lisinopril-hydrochlorothiazide) .... Two daily 3)   Aspirin 81 Mg Tbec (Aspirin) .... Once daily 4)  Pravachol 40 Mg Tabs (Pravastatin sodium) .... One daily 5)  Flexeril 10 Mg Tabs (Cyclobenzaprine hcl) .Marland Kitchen.. 1 by mouth three times a day as needed 6)  Neurontin 600 Mg Tabs (Gabapentin) .Marland Kitchen.. 1 tablet three times a day 7)  Kapidex 60 Mg Cpdr (Dexlansoprazole) .... Once daily 8)  Metoprolol Tartrate 25 Mg Tabs (Metoprolol tartrate) .... One two times a day 9)  Lortab 5 5-500 Mg Tabs (Hydrocodone-acetaminophen) .... One by mouth q 4 hrs as needed pain  Hypertension Assessment/Plan:      The patient's hypertensive risk group is category B: At least one risk factor (excluding diabetes) with no target organ damage.  Her calculated 10 year risk of coronary heart disease is 9 %.  Today's blood pressure is 128/88.  Her blood pressure goal is < 140/90.   Patient Instructions: 1)  Please schedule a follow-up appointment in 1 month with new PCP.

## 2010-10-21 NOTE — Assessment & Plan Note (Signed)
Summary: follow up 6 week/slj   Vital Signs:  Patient Profile:   65 Years Old Female Height:     66 inches (167.64 cm) Weight:      234 pounds BMI:     37.91 O2 Sat:      99 % Pulse rate:   87 / minute Resp:     12 per minute BP sitting:   134 / 89  Vitals Entered By: Sherilyn Banker (May 29, 2008 2:20 PM)                Pt had injection in lower back last Mon.  PCP:  [name]  Chief Complaint:  follow up visit/ need BP med refills.  History of Present Illness: Pt in for recheck.  She has DDD in her neck and back.  She cannot afford neurosurgery eval and states she has no insurance. She states she has had two shots lower back and one in neck. She states the shot in her neck helped but not the ones in her backs. She states she hurts today and states hard to move related to this. She states she is not set for more injections and states she cont on Vicodin and Neurontin. States pain is 15 with walking. States when she sits still at 7. She has completed PT and states really did not help much at all. States did few months and little help. She cont to use a cane. She denies falls and injuries but adds hs caught self a few times.   She states she sees Dr. Romeo Apple for recheck after shot completion and they are supposed to call her neck.  She states her neck has improved after the shot. She states 90% better. She states less electricity going through arms and states hands will cramp at times. She states arms do feel weak. She can grip objects and states has hard time sometimes holding objects as hands tremble.  She does not have the $200 to py upfront to see Neurosurgery at this time. States called them herself. She knows I have done what I can do. She has gained 7 pounds since July and states feels puffy. She states she cannot exersize as she wants to due to pain.   She now presents.  Hypertension History:      She denies headache, chest pain, palpitations, dyspnea with  exertion, orthopnea, PND, peripheral edema, visual symptoms, neurologic problems, syncope, and side effects from treatment.  She notes no problems with any antihypertensive medication side effects.        Positive major cardiovascular risk factors include female age 34 years old or older, hyperlipidemia, and hypertension.  Negative major cardiovascular risk factors include no history of diabetes, negative family history for ischemic heart disease, and non-tobacco-user status.        Further assessment for target organ damage reveals no history of ASHD, stroke/TIA, or peripheral vascular disease.    Lipid Management History:      Positive NCEP/ATP III risk factors include female age 56 years old or older and hypertension.  Negative NCEP/ATP III risk factors include no history of early menopause without estrogen hormone replacement, non-diabetic, no family history for ischemic heart disease, non-tobacco-user status, no ASHD (atherosclerotic heart disease), no prior stroke/TIA, no peripheral vascular disease, and no history of aortic aneurysm.        The patient states that she knows about the "Therapeutic Lifestyle Change" diet.  Her compliance with the TLC diet is fair.  The  patient expresses understanding of adjunctive measures for cholesterol lowering.  She expresses no side effects from her lipid-lowering medication.  The patient denies any symptoms to suggest myopathy or liver disease from her "statin" therapy.  Comments: Agrees to have LFTs.      Prior Medications Reviewed Using: Patient Recall  Updated Prior Medication List: LEVOTHROID 112 MCG  TABS (LEVOTHYROXINE SODIUM) One daily LISINOPRIL-HYDROCHLOROTHIAZIDE 20-12.5 MG TABS (LISINOPRIL-HYDROCHLOROTHIAZIDE) once daily ASPIRIN 81 MG TBEC (ASPIRIN) once daily PRAVACHOL 40 MG  TABS (PRAVASTATIN SODIUM) One daily NEURONTIN 300 MG  CAPS (GABAPENTIN) one by mouth tid LORTAB 5 5-500 MG  TABS (HYDROCODONE-ACETAMINOPHEN) 1/2 q 4 hrs prn * STANDARD  CANE to use while walking FLEXERIL 10 MG TABS (CYCLOBENZAPRINE HCL) three times a day  Current Allergies (reviewed today): ! DARVON  Past Medical History:    Reviewed history from 09/07/2006 and no changes required:       Current Problems:        TRANSAMINASES, SERUM, ELEVATED (ICD-790.4)       H N P-LUMBAR (ICD-722.10)       CYSTITIS, ACUTE (ICD-595.0)       SPINAL STENOSIS, CERVICAL (ICD-723.0)       SPINAL STENOSIS, LUMBAR (ICD-724.02)       PATELLAR TENDINITIS (ICD-726.64)       KNEE PAIN (ICD-719.46)       ARM PAIN, LEFT (ICD-729.5)       ANSERINE BURSITIS, RIGHT (ICD-726.61)       ? of SLEEP APNEA (ICD-780.57)       TEAR LATERAL MENISCUS (ICD-836.1)       OBESITY NOS (ICD-278.00)       ALLERGIC RHINITIS, SEASONAL (ICD-477.0)       FIBROCYSTIC BREAST DISEASE (ICD-610.1)       DEGENERATION, DISC NOS (ICD-722.6)       OVERACTIVE BLADDER (ICD-596.51)       PUD (ICD-533.90)       OSTEOARTHRITIS (ICD-715.90)       HYPOTHYROIDISM (ICD-244.9)       HYPERTENSION (ICD-401.9)       HYPERLIPIDEMIA (ICD-272.4)       GERD (ICD-530.81)       DIVERTICULOSIS, COLON (ICD-562.10)         Past Surgical History:    Reviewed history from 09/26/2007 and no changes required:       Hysterectomy       Lumpectomy       Tonsillectomy       Right knee arthroscopy August 2008 - Dr. Alison Murray: 08/29/07 - EF 82% - no ischemia     Review of Systems      See HPI   Physical Exam  General:     Well-developed,well-nourished,in no acute distress; alert,appropriate and cooperative throughout examination. Appears tired and worn out. Walks with cane. Neck:     Tender ROM. Lungs:     Normal respiratory effort, chest expands symmetrically. Lungs are clear to auscultation, no crackles or wheezes. Heart:     Normal rate and regular rhythm. S1 and S2 normal without gallop, murmur, click, rub or other extra sounds. Abdomen:     Bowel sounds positive,abdomen soft and non-tender  without masses, organomegaly or hernias noted. Extremities:     No clubbing, cyanosis, edema, or deformity noted with normal full range of motion of all joints.   Neurologic:     Back pain and sitting upright on table with grimmace when asked to lie down. Has paraspinous mucle spasm and  tender C-spine, L-spine and T-spine. Neg SLT to 60 degrees. Reflexes 2 + and symmetric - perhaps mildly decreased lower legs. Sensation grossly intact.    Impression & Recommendations:  Problem # 1:  SPINAL STENOSIS, CERVICAL (ICD-723.0) Advised neurosuyrgery consult. Money major hurdle. Advised all I can offer at this point as already on pain medication with Neurontin. She had good response to single epidural and states pain still there but less electicity in arm. She will think about neurosuegry eval and update. Advised to see what Dr. Romeo Apple advised for L-spine DDD and if he agrees with neurosurgery eval will be happy to refer to aide in both areas of eval. Start short course Flexeril for muscle spasm. Update if sx worsen. Aware of risk for nerve compression, weakness and functionality loss.  Problem # 2:  SPINAL STENOSIS, LUMBAR (ICD-724.02) See above.  Problem # 3:  TRANSAMINASES, SERUM, ELEVATED (ICD-790.4) Eleavetd ALT at 40 last July. Repeat to assure resolved.  Note statin and steroid use. Orders: T-Hepatic Function [id number])   Problem # 4:  OBESITY NOS (ICD-278.00) Weight gain noted. Suspect steroid induced. Advised diet, exersize as can and portion control. States will try.  Complete Medication List: 1)  Levothroid 112 Mcg Tabs (Levothyroxine sodium) .... One daily 2)  Lisinopril-hydrochlorothiazide 20-12.5 Mg Tabs (Lisinopril-hydrochlorothiazide) .... Once daily 3)  Aspirin 81 Mg Tbec (Aspirin) .... Once daily 4)  Pravachol 40 Mg Tabs (Pravastatin sodium) .... One daily 5)  Neurontin 300 Mg Caps (Gabapentin) .... One by mouth tid 6)  Lortab 5 5-500 Mg Tabs (Hydrocodone-acetaminophen)  .... 1/2 q 4 hrs prn 7)  Standard Cane  .... To use while walking 8)  Flexeril 10 Mg Tabs (Cyclobenzaprine hcl) .... Three times a day  Hypertension Assessment/Plan:      The patient's hypertensive risk group is category B: At least one risk factor (excluding diabetes) with no target organ damage.  Her calculated 10 year risk of coronary heart disease is 7 %.  Today's blood pressure is 134/89.  Her blood pressure goal is < 140/90.  Lipid Assessment/Plan:      Based on NCEP/ATP III, the patient's risk factor category is "2 or more risk factors and a calculated 10 year CAD risk of < 20%".  From this information, the patient's calculated lipid goals are as follows: Total cholesterol goal is 200; LDL cholesterol goal is 130; HDL cholesterol goal is 40; Triglyceride goal is 150.     Patient Instructions: 1)  Please schedule a follow-up appointment in 1 month.   Prescriptions: FLEXERIL 10 MG TABS (CYCLOBENZAPRINE HCL) three times a day  #15 x 0   Entered and Authorized by:   Franchot Heidelberg MD   Signed by:   Franchot Heidelberg MD on 05/29/2008   Method used:   Print then Give to Patient   RxID:   [id number]  ]

## 2010-10-21 NOTE — Progress Notes (Signed)
Summary: wants samples of lipitor  Phone Note Call from Patient   Reason for Call: Talk to Nurse Summary of Call: patient called stating that she fogot to ask for samples of lipitor...Marland Kitchen  please advise Initial call taken by: Donneta Romberg,  February 24, 2008 11:12 AM  Follow-up for Phone Call        samples at front desk. pt notified. Follow-up by: Sherilyn Banker,  February 24, 2008 11:20 AM

## 2010-10-21 NOTE — Letter (Signed)
Summary: refills  refills   Imported By: Magdalene River 11/26/2006 14:24:38  _____________________________________________________________________  External Attachment:    Type:   Image     Comment:   External Document

## 2010-10-21 NOTE — Letter (Signed)
Summary: imaging  imaging   Imported By: Altamease Oiler 06/05/2009 13:43:21  _____________________________________________________________________  External Attachment:    Type:   Image     Comment:   External Document

## 2010-10-21 NOTE — Assessment & Plan Note (Signed)
Summary: heart palpatations and ?uti/slj   Vital Signs:  Patient profile:   65 year old female Height:      66 inches Weight:      231 pounds BMI:     37.42 O2 Sat:      98 % Temp:     97.8 degrees F Pulse rate:   86 / minute Resp:     14 per minute BP sitting:   138 / 84  Vitals Entered By: Sherilyn Banker LPN (Feb 06, 2009 10:32 AM) CC: palpatations x 4 days, UTI   Primary Greg Eckrich:  [name]  CC:  palpatations x 4 days and UTI.  History of Present Illness: Pt in for acute visit.  She thinks she may have a UTI. She states when she lays down feels like bugfs are crawling in bladder. Started last Friday. She states her urine does not burn, has strong odor and she notes sometimes will pee small amounts. Sometimes will have very full bladder and almost pee on self. States just since last Friday though. She denies abdominal painbut notes fullness over pubic area. She denies fever, chills and sweats.  She is sexually active. She has pushed fluids to try and alleviate and it helps. Denies nocturia.  She has had palpitations. She states has had this since Saturday. She states she was lying on couch relaxing and she got really fast heartbeat. She states heart beat hard and felt like it was skipping beats. She states lasted about 30 minutes and then stoped by itself. She just kept resting and tried to fall asleep. She denies significant chest pain and states just felt like a twicth in chest. She did not feel winded. She denies weating. She denies orthopnea, PND and palpitations. She states legs were a little puffy. She denies prior hx of same. She does drink one cup of coffee a day. Denies soda use. She denies use of new meds. Single bout only. She had extensive cardiac eval last year in February under Dr. Skip Estimable. This includes stress myo, Echo, labs and ABIs. All were negative. She states was told to exersize and this is confirmed per note review. See EMR. She denies anxiety and  depression. Little stress in life if any.  She now presents.  Preventive Screening-Counseling & Management     Alcohol drinks/day: 0     Smoking Status: quit     Smoking Cessation Counseling: no     Smoke Cessation Stage: quit     Packs/Day: 1.5     Year Started: Age 17     Year Quit: 1987     Pack years: 32  Current Problems (verified): 1)  Acute Cystitis  (ICD-595.0) 2)  Liver Function Tests, Abnormal, Hx of  (ICD-V12.2) 3)  Morbid Obesity  (ICD-278.01) 4)  Back Pain  (ICD-724.5) 5)  Anserine Bursitis  (ICD-726.61) 6)  Gerd  (ICD-530.81) 7)  Benign Positional Vertigo  (ICD-386.11) 8)  H N P-lumbar  (ICD-722.10) 9)  Spinal Stenosis, Cervical  (ICD-723.0) 10)  Spinal Stenosis, Lumbar  (ICD-724.02) 11)  Patellar Tendinitis  (ICD-726.64) 12)  ? of Sleep Apnea  (ICD-780.57) 13)  Tear Lateral Meniscus  (ICD-836.1) 14)  Obesity Nos  (ICD-278.00) 15)  Allergic Rhinitis, Seasonal  (ICD-477.0) 16)  Fibrocystic Breast Disease  (ICD-610.1) 17)  Degeneration, Disc Nos - C-spine and L-spine  (ICD-722.6) 18)  Overactive Bladder  (ICD-596.51) 19)  Hx of Pud  (ICD-533.90) 20)  Osteoarthritis  (ICD-715.90) 21)  Hypothyroidism  (ICD-244.9) 22)  Hypertension  (ICD-401.9) 23)  Hyperlipidemia  (ICD-272.4) 24)  Gerd  (ICD-530.81) 25)  Diverticulosis, Colon  (ICD-562.10)  Current Medications (verified): 1)  Levothroid 100 Mcg Tabs (Levothyroxine Sodium) .Marland Kitchen.. 1 By Mouth Once Daily 2)  Lisinopril-Hydrochlorothiazide 20-12.5 Mg Tabs (Lisinopril-Hydrochlorothiazide) .... Two Daily 3)  Aspirin 81 Mg Tbec (Aspirin) .... Once Daily 4)  Pravachol 40 Mg  Tabs (Pravastatin Sodium) .... One Daily 5)  Flexeril 10 Mg Tabs (Cyclobenzaprine Hcl) .Marland Kitchen.. 1 By Mouth Three Times A Day As Needed 6)  Neurontin 600 Mg Tabs (Gabapentin) .Marland Kitchen.. 1 Tablet Three Times A Day 7)  Naprosyn 500 Mg Tabs (Naproxen) .... Two Times A Day 8)  Vicodin 5-500 Mg Tabs (Hydrocodone-Acetaminophen) .... One Daily For 6 Weeks 9)   Pyridium 200 Mg Tabs (Phenazopyridine Hcl) .... One Three Times A Day  Allergies: 1)  ! Darvon  Past History:  Past Medical History:    Current Problems:     TRANSAMINASES, SERUM, ELEVATED (ICD-790.4)    H N P-LUMBAR (ICD-722.10)    CYSTITIS, ACUTE (ICD-595.0)    SPINAL STENOSIS, CERVICAL (ICD-723.0)    SPINAL STENOSIS, LUMBAR (ICD-724.02)    PATELLAR TENDINITIS (ICD-726.64)    KNEE PAIN (ICD-719.46)    ARM PAIN, LEFT (ICD-729.5)    ANSERINE BURSITIS, RIGHT (ICD-726.61)    ? of SLEEP APNEA (ICD-780.57)    TEAR LATERAL MENISCUS (ICD-836.1)    OBESITY NOS (ICD-278.00)    ALLERGIC RHINITIS, SEASONAL (ICD-477.0)    FIBROCYSTIC BREAST DISEASE (ICD-610.1)    DEGENERATION, DISC NOS (ICD-722.6)    OVERACTIVE BLADDER (ICD-596.51)    PUD (ICD-533.90)    OSTEOARTHRITIS (ICD-715.90)    HYPOTHYROIDISM (ICD-244.9)    HYPERTENSION (ICD-401.9)    HYPERLIPIDEMIA (ICD-272.4)    GERD (ICD-530.81)    DIVERTICULOSIS, COLON (ICD-562.10)     (05/29/2008)  Past Surgical History:    1. Hysterectomy    2. Lumpectomy    3. Tonsillectomy    4. Right knee arthroscopy August 2008 - Dr. Romeo Apple    5. Stress Myoview: 08/29/07 - EF 82% - no ischemia (12/31/2008)  Family History:    Father: Dead 86 CAD    Mother: Dead  at 38 PE post shoulder surgery    Brothers - 2x - age 16  DDD, DJD knees and 68 - healthy    Sisters - 3 sisters - age 10 CAD and CVA and DDD, 6 DDD, 6 ?    Kids - 2 boys - age 31 healthy and 35 -  MS per hx     (12/31/2008)  Social History:    Married x 2    Alcohol use-none    Drug use-no    Former Smoker    Lives with spouse     Education - BSC in Business Admin - disabled 2010 due to DDD (12/31/2008)  Risk Factors:    Alcohol Use: 0 (12/31/2008)    >5 drinks/d w/in last 3 months: N/A    Caffeine Use: N/A    Diet: N/A    Exercise: N/A  Risk Factors:    Smoking Status: quit (12/31/2008)    Packs/Day: 1.5 (12/31/2008)    Cigars/wk: N/A    Pipe Use/wk: N/A    Cans  of tobacco/wk: N/A    Passive Smoke Exposure: N/A  Review of Systems      See HPI  Physical Exam  General:  Obese, well devloped. No distress.  Neck:  No deformities, masses, or tenderness noted. Lungs:  Normal respiratory effort, chest expands symmetrically. Lungs  are clear to auscultation, no crackles or wheezes. Heart:  Normal rate and regular rhythm. S1 and S2 normal without gallop, murmur, click, rub or other extra sounds. Abdomen:  Bowel sounds positive,abdomen soft and non-tender without masses, organomegaly or hernias noted. Extremities:  No clubbing, cyanosis, edema, or deformity noted with normal full range of motion of all joints.   Cervical Nodes:  No lymphadenopathy noted Psych:  Cognition and judgment appear intact. Alert and cooperative with normal attention span and concentration. No apparent delusions, illusions, hallucinations   Impression & Recommendations:  Problem # 1:  PALPITATIONS (ICD-785.1) Discussed. Neg cardiac work up little over year ago. Single bout and on deeper digging admits daughter who was forced to leave house a few weeks ago brought grandson for visit on afternoon she had symptoms. Was anxiosu at time and may have contributed. I did an EKG and this was unremarkable. I will do lab to eval electrolytes and TSH. We will gt 24 hr holter. Pending result and symptoms, refer Cardiology. Immediate ED eval if recurs. Recheck 2 weeks otherwise. Orders: EKG w/ Interpretation (93000) T-Comprehensive Metabolic Panel ([id number]) Misc. Referral (Misc. Ref)  Problem # 2:  DYSURIA (ICD-788.1) UA done and negative. Suspect bladder spasm. Start Pyridium, push fluids including cranberry juice and update if persist or worsen. Councelled perineal hygiene and post coital voiding. Her updated medication list for this problem includes:    Pyridium 200 Mg Tabs (Phenazopyridine hcl) ..... One three times a day  Complete Medication List: 1)  Levothroid 100 Mcg Tabs  (Levothyroxine sodium) .Marland Kitchen.. 1 by mouth once daily 2)  Lisinopril-hydrochlorothiazide 20-12.5 Mg Tabs (Lisinopril-hydrochlorothiazide) .... Two daily 3)  Aspirin 81 Mg Tbec (Aspirin) .... Once daily 4)  Pravachol 40 Mg Tabs (Pravastatin sodium) .... One daily 5)  Flexeril 10 Mg Tabs (Cyclobenzaprine hcl) .Marland Kitchen.. 1 by mouth three times a day as needed 6)  Neurontin 600 Mg Tabs (Gabapentin) .Marland Kitchen.. 1 tablet three times a day 7)  Naprosyn 500 Mg Tabs (Naproxen) .... Two times a day 8)  Vicodin 5-500 Mg Tabs (Hydrocodone-acetaminophen) .... One daily for 6 weeks 9)  Pyridium 200 Mg Tabs (Phenazopyridine hcl) .... One three times a day  Other Orders: UA Dipstick w/o Micro (automated)  (81003) T-TSH ([id number])  Patient Instructions: 1)  Please schedule a follow-up appointment in 2 weeks. Prescriptions: PYRIDIUM 200 MG TABS (PHENAZOPYRIDINE HCL) One three times a day  #6 x 0   Entered and Authorized by:   Franchot Heidelberg MD   Signed by:   Franchot Heidelberg MD on 02/06/2009   Method used:   Electronically to        [facility]  [address] [address] [facility] (retail)       [address]       [place], [place]  [address]       Ph: [phone]       Fax: [phone]   RxID:   [id number]    EKG  Procedure date:  02/06/2009  Findings:      normal:  HR 94 BPM Sinus rythm   Laboratory Results   Urine Tests  Date/Time Recieved: Feb 06, 2009 10:48 AM  Date/Time Reported: Feb 06, 2009 10:48 AM   Routine Urinalysis   Color: yellow Appearance: Clear Glucose: negative   (Normal Range: Negative) Bilirubin: negative   (Normal Range: Negative) Ketone: negative   (Normal Range: Negative) Spec. Gravity: <1.005   (Normal Range: 1.003-1.035) Blood: negative   (Normal Range:  Negative) pH: 6.0   (Normal Range: 5.0-8.0) Protein: negative   (Normal Range: Negative) Urobilinogen: 0.2   (Normal Range: 0-1) Nitrite: negative   (Normal Range: Negative) Leukocyte Esterace: negative   (Normal  Range: Negative)          Laboratory Results   Urine Tests    Routine Urinalysis   Color: yellow Appearance: Clear Glucose: negative   (Normal Range: Negative) Bilirubin: negative   (Normal Range: Negative) Ketone: negative   (Normal Range: Negative) Spec. Gravity: <1.005   (Normal Range: 1.003-1.035) Blood: negative   (Normal Range: Negative) pH: 6.0   (Normal Range: 5.0-8.0) Protein: negative   (Normal Range: Negative) Urobilinogen: 0.2   (Normal Range: 0-1) Nitrite: negative   (Normal Range: Negative) Leukocyte Esterace: negative   (Normal Range: Negative)       Appended Document: heart palpatations and ?uti/slj holter monitor appt scheduled on 02/11/09 @11 :30am at [facility]. Patient informed and order faxed.LM

## 2010-10-21 NOTE — Assessment & Plan Note (Signed)
    PCP:  [name]   History of Present Illness: SARK  POST OP 04/20/2007  DOING WELL   Current Allergies: ! DARVON       Knee Exam  General:    Well-developed, well-nourished, in no acute distress; alert and oriented x 3.    Gait:    Normal heel-toe gait pattern bilaterally.    Skin:    Intact with no erythema; no scarring.    Knee Exam:    Right:    Inspection:  Normal    Palpation:  Normal    Stability:  stable    Tenderness:  no    Swelling:  no    Erythema:  no    Range of Motion:       Flexion-Active: 120 degrees       Extension-Active: full       Flexion-Passive: full       Extension-Passive: full    Left:    Inspection:  Normal    Palpation:  Normal    Stability:  stable    Tenderness:  no    Swelling:  no    Erythema:  no    Range of Motion:       Flexion-Active: full       Extension-Active: full       Flexion-Passive: full       Extension-Passive: full     Patient Instructions: 1)  FINISH PT RETURN IN  1 MONTH  2)  STANLEY E HARRISON

## 2010-10-21 NOTE — Miscellaneous (Signed)
Summary: PT Progress Note  PT Progress Note   Imported By: Lutricia Horsfall LPN 16/06/9603 54:09:81  _____________________________________________________________________  External Attachment:    Type:   Image     Comment:   External Document

## 2010-10-21 NOTE — Letter (Signed)
Summary: Record request from disability  Record request from disability   Imported By: Donneta Romberg 03/07/2008 10:14:56  _____________________________________________________________________  External Attachment:    Type:   Image     Comment:   External Document

## 2010-10-21 NOTE — Assessment & Plan Note (Signed)
Summary: Sara [name]    PCP:  Sara [name]   History of Present Illness: I saw Sara [name] back in the office today.  She is now 2.5 mos after knee surgery. Right now we are dealing with bursitis and medial knee pain. The swelling is much less and her activities are easier to do and more vigorous.  Current Allergies: ! DARVON  Past Medical History:    Reviewed history from 09/07/2006 and no changes required:       Diverticulosis, colon       GERD       Hyperlipidemia       Hypertension       Hypothyroidism       Osteoarthritis   Family History:    Reviewed history from 10/29/2006 and no changes required:       Father: Dead 29 CAD       Mother: Dead  at 36 PE post shoulder surgery       Siblings:   Social History:    Reviewed history from 10/29/2006 and no changes required:       Married       Alcohol use-seldom       Drug use-no       Former Smoker      Knee Exam  General:    Well-developed, well-nourished, in no acute distress; alert and oriented x 3.    Gait:    Normal heel-toe gait pattern bilaterally.    Skin:    Intact with no erythema; no scarring.    Inspection:    plantigrade foot with normal alignment of leg, ankle, hindfoot, and foot.   Palpation:    non-tender to palpation over medial joint line, lateral joint line, parapatellar, condylar, patellar tendon, or Pes bursa.   Vascular:    dorsalis pedis and posterior tibial pulses 2+ and symmetric, capillary refill < 2 seconds, normal hair pattern, no evidence of ischemia.   Knee Exam:    Right:    Inspection:  Abnormal    Palpation:  Abnormal       Location:  medial pes bursa    Stability:  stable    Tenderness:  medial knee    Swelling:  minimal    Range of Motion:       Flexion-Active: full       Extension-Active: full       Flexion-Passive: full       Extension-Passive: full    Impression & Recommendations:  Problem # 1:  OSTEOARTHRITIS, LOWER LEG  (ICD-715.16)  Problem # 2:  TEAR LATERAL MENISCUS (ICD-836.1)  Problem # 3:  KNEE PAIN, RIGHT (ICD-719.46)   Verbal consent was obtained. The right knee was prepped with alcohol and ethyl chloride. depomedrol 40mg /cc and sensorcaine .25% was injected. there were no coplications.    Patient Instructions: 1)  Please schedule a follow-up appointment in 1 month.    ]

## 2010-10-21 NOTE — Progress Notes (Signed)
[facility]: [facility]   Imported By: Curtis Sites 10/17/2008 10:59:54  _____________________________________________________________________  External Attachment:    Type:   Image     Comment:   External Document

## 2010-10-21 NOTE — Assessment & Plan Note (Signed)
Summary: 6 month follow up/ dms   Vital Signs:  Patient Profile:   65 Years Old Female Height:     66 inches (167.64 cm) Weight:      211 pounds BMI:     34.18 O2 Sat:      98 % Temp:     97.6 degrees F Pulse rate:   88 / minute Resp:     14 per minute BP sitting:   121 / 82  Vitals Entered By: Sherilyn Banker (September 26, 2007 10:14 AM)                 PCP:  [name]  Chief Complaint:  follow up visit.  History of Present Illness: Pt in for recheck.  She notes she is doing well. She underwent a stress myoview under Dr. Domingo Sep on 08/30/07 after having left arm pain. Result shows no ischemia and she is set for follow-up under her care mid January. States also had leg dopplers done as she complaied about some leg pain. Went down to [place] and found lotion - unsure of name but contains Vit E, Grape juice, Johoba and few other ingredients. She rubbed this on her knees with hx of bursitus and it improved pain. States also helped with aches and pains in calves when walking. Denies further chest pain, orthopnea, PND and ankle edema. No palpitaions.NMot taking baby aspirin every day and adds will try to do better. Sexually active with minimal if any left arm pain now.  Now presents.  Hypertension History:      She complains of chest pain, but denies headache, palpitations, dyspnea with exertion, orthopnea, peripheral edema, visual symptoms, neurologic problems, syncope, and side effects from treatment.  She notes no problems with any antihypertensive medication side effects.  Further comments include: See HPI.        Positive major cardiovascular risk factors include female age 62 years old or older, hyperlipidemia, and hypertension.  Negative major cardiovascular risk factors include no history of diabetes, negative family history for ischemic heart disease, and non-tobacco-user status.        Further assessment for target organ damage reveals no history of ASHD,  stroke/TIA, or peripheral vascular disease.    Lipid Management History:      Positive NCEP/ATP III risk factors include female age 74 years old or older and hypertension.  Negative NCEP/ATP III risk factors include no history of early menopause without estrogen hormone replacement, non-diabetic, no family history for ischemic heart disease, non-tobacco-user status, no ASHD (atherosclerotic heart disease), no prior stroke/TIA, no peripheral vascular disease, and no history of aortic aneurysm.        The patient states that she knows about the "Therapeutic Lifestyle Change" diet.  Her compliance with the TLC diet is fair.  The patient expresses understanding of adjunctive measures for cholesterol lowering.  Adjunctive measures started by the patient include aerobic exercise, fiber, ASA, and omega-3 supplements.  She expresses no side effects from her lipid-lowering medication.  The patient denies any symptoms to suggest myopathy or liver disease from her "statin" therapy.      Current Allergies (reviewed today): ! DARVON  Past Surgical History:    Reviewed history from 05/25/2007 and no changes required:       Hysterectomy       Lumpectomy       Tonsillectomy       Right knee arthroscopy August 2008 - Dr. Alison Murray: 08/29/07 -  EF 82% - no ischemia   Family History:    Reviewed history from 10/29/2006 and no changes required:       Father: Dead 86 CAD       Mother: Dead  at 90 PE post shoulder surgery  Social History:    Reviewed history from 10/29/2006 and no changes required:       Married       Alcohol use-seldom       Drug use-no       Former Smoker   Risk Factors: Tobacco use:  quit    Year quit:  1987    Pack-years:  27 Drug use:  no Alcohol use:  no  Family History Risk Factors:    Family History of MI in females < 75 years old:  no    Family History of MI in males < 61 years old:  no  Colonoscopy History:    Date of Last Colonoscopy:   06/23/2006  Mammogram History:    Date of Last Mammogram:  07/11/2007  PAP Smear History:    Date of Last PAP Smear:  10/29/2006   Review of Systems      See HPI  General      Denies fatigue and malaise.  Resp      Denies cough, shortness of breath, sputum productive, and wheezing.  GI      Denies abdominal pain, constipation, diarrhea, nausea, and vomiting.  GU      Denies abnormal vaginal bleeding, nocturia, urinary frequency, and urinary hesitancy.  Psych      Denies anxiety and depression.      Some snoring. Happy about normal sleep study.   Physical Exam  General:     Well-developed, well-nourished, normal body habitus; no deformities, normal grooming. Lungs:     Normal respiratory effort, chest expands symmetrically. Lungs are clear to auscultation, no crackles or wheezes. Heart:     Normal rate and regular rhythm. S1 and S2 normal without gallop, murmur, click, rub or other extra sounds. Abdomen:     Bowel sounds positive,abdomen soft and non-tender without masses, organomegaly or hernias noted.  No surgical scars to suggest lap choly - does have hx scar lower mid abdomen Extremities:     No redness or swelling. Cervical Nodes:     No lymphadenopathy noted Psych:     Cognition and judgment appear intact. Alert and cooperative with normal attention span and concentration. No apparent delusions, illusions, hallucinations    Impression & Recommendations:  Problem # 1:  ARM PAIN, LEFT (ICD-729.5) Neg stress Myo. Reassuring. Advised need to follow-up with Dr. Domingo Sep for completion of eval of cardiac cause and also to ee what leg dopplers showed. Will get records. Must take baby aspirin daily. Advised on risk and benefit. Agrees.  Problem # 2:  ANSERINE BURSITIS, RIGHT (ICD-726.61) Per Dr. Romeo Apple.May use topical rx as bought in [place]. Await call back on what this is. See Dr. Romeo Apple as scheduled.  Problem # 3:  HYPERTENSION (ICD-401.9) Stable. Rx as  is. Her updated medication list for this problem includes:    Lisinopril-hydrochlorothiazide 20-12.5 Mg Tabs (Lisinopril-hydrochlorothiazide) ..... Once daily   Problem # 4:  HYPERLIPIDEMIA (ICD-272.4) Statin as is. Await labs from Dr. Domingo Sep. TLC a must. Her updated medication list for this problem includes:    Lipitor 20 Mg Tabs (Atorvastatin calcium) ..... One daily   Problem # 5:  HYPOTHYROIDISM (ICD-244.9) Stable. Rx as is. Her updated medication list for this problem includes:  Levothyroxine Sodium 100 Mcg Tabs (Levothyroxine sodium) .Marland Kitchen... 1 by mouth once daily   Complete Medication List: 1)  Levothyroxine Sodium 100 Mcg Tabs (Levothyroxine sodium) .Marland Kitchen.. 1 by mouth once daily 2)  Lisinopril-hydrochlorothiazide 20-12.5 Mg Tabs (Lisinopril-hydrochlorothiazide) .... Once daily 3)  Aspirin 81 Mg Tbec (Aspirin) .... Once daily 4)  Xyzal 5 Mg Tabs (Levocetirizine dihydrochloride) .... One by mouth daily 5)  Neurontin 100 Mg Caps (Gabapentin) .... One at bedtime 6)  Lipitor 20 Mg Tabs (Atorvastatin calcium) .... One daily  Hypertension Assessment/Plan:      The patient's hypertensive risk group is category B: At least one risk factor (excluding diabetes) with no target organ damage.  Her calculated 10 year risk of coronary heart disease is 7 %.  Today's blood pressure is 121/82.  Her blood pressure goal is < 140/90.  Lipid Assessment/Plan:      Based on NCEP/ATP III, the patient's risk factor category is "2 or more risk factors and a calculated 10 year CAD risk of < 20%".  From this information, the patient's calculated lipid goals are as follows: Total cholesterol goal is 200; LDL cholesterol goal is 130; HDL cholesterol goal is 40; Triglyceride goal is 150.     Patient Instructions: 1)  Please schedule a follow-up appointment in 6 months.    ]  Preventive Care Screening  Ejection Fraction:    Date:  08/29/2007    Results:  EF 82 % - neg stress Myo %   Appended  Document: 6 month follow up/ dms last lower leg dopplers requested from dr bradsher's office 01.07.09.Marland KitchenMarland Kitchenarc

## 2010-10-21 NOTE — Medication Information (Signed)
Summary: flexeril  flexeril   Imported By: Curtis Sites 02/08/2008 13:31:22  _____________________________________________________________________  External Attachment:    Type:   Image     Comment:   External Document

## 2010-10-21 NOTE — Assessment & Plan Note (Signed)
Summary: FOL UP AFTER ESI INJEC'S/M.C.DISCOUNT/CAF    PCP:  [name]   History of Present Illness: I saw Sara [name] in the office today for a followup visit.  She is a 65 years old woman with the complaint of:  back pain.  Patient is here today to follow up after ESI's.  Patient states she had 2 injections in her back and one in her neck . She states that it helped for a while.  Now she is in pain   TREATMENTS: FLEXERIL, NEURONTIN, LORTAB   MRI SPINE:  IMPRESSION: Right posterolateral disc herniation at L5-S1 that could compress the right S1 nerve root.  Facet arthropathy at this level as well, right worse than left.   L4-5:  Broad-based disc herniation without definite neural compression.  Mild narrowing of the lateral recesses.   L3-4:  Facet arthropathy.  2 mm of anterolisthesis.  Shallow protrusion of disc material.  No definite neural compression.     Current Allergies: ! DARVON        Impression & Recommendations:  Problem # 1:  H N P-LUMBAR (ICD-722.10) Assessment: Deteriorated  Orders: Est. Patient Level III (88416)   Problem # 2:  SPINAL STENOSIS, CERVICAL (ICD-723.0) Assessment: Deteriorated  Orders: Est. Patient Level III (60630)  The patient is having continued symptoms. The only nonoperative treatment that has not been tried his physical therapy. She would like to do this in the water which is a good idea. For now she does not want to consider surgery was addressed. She would like to try therapy and continue medications.  Medications Added to Medication List This Visit: 1)  Flexeril 10 Mg Tabs (Cyclobenzaprine hcl) .Marland Kitchen.. 1 by mouth three times a day as needed   Patient Instructions: 1)  Please schedule a follow-up appointment in 3 months.   Prescriptions: NEURONTIN 300 MG  CAPS (GABAPENTIN) one by mouth tid  #90 x 2   Entered and Authorized by:   Fuller Canada MD   Signed by:   Fuller Canada MD on 06/25/2008  Method used:   Print then Give to Patient   RxID:   [id number] FLEXERIL 10 MG TABS (CYCLOBENZAPRINE HCL) 1 by mouth three times a day as needed  #90 x 3   Entered and Authorized by:   Fuller Canada MD   Signed by:   Fuller Canada MD on 06/25/2008   Method used:   Print then Give to Patient   RxID:   [id number] LORTAB 5 5-500 MG  TABS (HYDROCODONE-ACETAMINOPHEN) 1/2 q 4 hrs prn  #40 x 2   Entered and Authorized by:   Fuller Canada MD   Signed by:   Fuller Canada MD on 06/25/2008   Method used:   Print then Give to Patient   RxID:   [id number] NEURONTIN 300 MG  CAPS (GABAPENTIN) one by mouth tid  #90 x 2   Entered and Authorized by:   Fuller Canada MD   Signed by:   Fuller Canada MD on 06/25/2008   Method used:   Print then Give to Patient   RxID:   [id number]  ]

## 2010-10-21 NOTE — Progress Notes (Signed)
Summary: patient called about neurosurgery appt  Phone Note Call from Patient   Summary of Call: patient called stating that [facility] isn't in the [facility] system and doesn't set up payment plans with patients.. Patients are required to pay $189.00 for intial visit.  PATIENT IS ON [facility] DISCOUNT Initial call taken by: Donneta Romberg,  April 18, 2008 9:48 AM  Follow-up for Phone Call        pt notified that there is not a neurosurgeon in [facility] system. She has not cancelled appt. yet. Follow-up by: Sherilyn Banker,  April 18, 2008 11:29 AM

## 2010-10-21 NOTE — Letter (Signed)
Summary: Div of Vocational Rehab form  Div of Vocational Rehab form   Imported By: Cammie Sickle 09/06/2008 18:42:21  _____________________________________________________________________  External Attachment:    Type:   Image     Comment:   External Document

## 2010-10-21 NOTE — Progress Notes (Signed)
Summary: need mail order Rx  Phone Note Call from Patient   Reason for Call: Talk to Nurse Summary of Call: patient is requesting a mail order Rx for her xyzal,lisinapril, levothyroxine....  Initial call taken by: Donneta Romberg,  June 17, 2007 4:44 PM      Prescriptions: LISINOPRIL-HYDROCHLOROTHIAZIDE 20-12.5 MG TABS (LISINOPRIL-HYDROCHLOROTHIAZIDE) once daily  #90 x 1   Entered by:   Sherilyn Banker   Authorized by:   Franchot Heidelberg MD   Signed by:   Sherilyn Banker on 06/17/2007   Method used:   Print then Give to Patient   RxID:   [id number] LEVOTHYROXINE SODIUM 100 MCG TABS (LEVOTHYROXINE SODIUM) 1 by mouth once daily  #90 x 1   Entered by:   Sherilyn Banker   Authorized by:   Franchot Heidelberg MD   Signed by:   Sherilyn Banker on 06/17/2007   Method used:   Print then Give to Patient   RxID:   [id number] XYZAL 5 MG TABS (LEVOCETIRIZINE DIHYDROCHLORIDE) One by mouth daily  #90 x 1   Entered by:   Sherilyn Banker   Authorized by:   Franchot Heidelberg MD   Signed by:   Sherilyn Banker on 06/17/2007   Method used:   Print then Give to Patient   RxID:   [id number]

## 2010-10-21 NOTE — Letter (Signed)
Summary: Record Request from [facility]  Record Request from [facility]   Imported By: Donneta Romberg 03/07/2008 10:14:24  _____________________________________________________________________  External Attachment:    Type:   Image     Comment:   External Document

## 2010-10-21 NOTE — Miscellaneous (Signed)
  Clinical Lists Changes  Medications: Added new medication of KAPIDEX 60 MG CPDR (DEXLANSOPRAZOLE) once daily    Samples given today. 1 month supply.

## 2010-10-21 NOTE — Progress Notes (Signed)
Summary: 02/03/08 lab result  Phone Note Outgoing Call   Call placed by: Sonny Dandy,  Feb 03, 2008 8:18 AM Summary of Call: Results given to patient, voices understanding   Initial call taken by: Sonny Dandy,  Feb 03, 2008 8:18 AM

## 2010-10-21 NOTE — Assessment & Plan Note (Signed)
Summary: rt knee pain/needs xrays/100 percent discount.cbt   Primary Provider:  [name]   History of Present Illness: I saw Sara [name] in the office today for a followup visit.  She is a 65 years old woman with the complaint of:  right leg pain.    She c/o  right leg is swelling, and burning pain that starts above the knee and radiates down outer leg,    Pain also occurs at night   Last xrays of the right knee 09/2007.  Last [facility] July 2009.  MRI L SPINE 2009.  MRI right knee 2008.   She will see Dr. Gerlene Fee soon   Neurontin 600mg  three times a day, helps some. Flexeril as needed.  She is doing quite  bit of knee eercises which may be aggravating the knee.     Allergies: 1)  ! Darvon  Past History:  Past Medical History:    Current Problems:     TRANSAMINASES, SERUM, ELEVATED (ICD-790.4)    H N P-LUMBAR (ICD-722.10)    CYSTITIS, ACUTE (ICD-595.0)    SPINAL STENOSIS, CERVICAL (ICD-723.0)    SPINAL STENOSIS, LUMBAR (ICD-724.02)    PATELLAR TENDINITIS (ICD-726.64)    KNEE PAIN (ICD-719.46)    ARM PAIN, LEFT (ICD-729.5)    ANSERINE BURSITIS, RIGHT (ICD-726.61)    ? of SLEEP APNEA (ICD-780.57)    TEAR LATERAL MENISCUS (ICD-836.1)    OBESITY NOS (ICD-278.00)    ALLERGIC RHINITIS, SEASONAL (ICD-477.0)    FIBROCYSTIC BREAST DISEASE (ICD-610.1)    DEGENERATION, DISC NOS (ICD-722.6)    OVERACTIVE BLADDER (ICD-596.51)    PUD (ICD-533.90)    OSTEOARTHRITIS (ICD-715.90)    HYPOTHYROIDISM (ICD-244.9)    HYPERTENSION (ICD-401.9)    HYPERLIPIDEMIA (ICD-272.4)    GERD (ICD-530.81)    DIVERTICULOSIS, COLON (ICD-562.10)     (05/29/2008)  Past Surgical History:    Hysterectomy    Lumpectomy    Tonsillectomy    Right knee arthroscopy August 2008 - Dr. Alison Murray: 08/29/07 - EF 82% - no ischemia (09/26/2007)  Family History:    Father: Dead 67 CAD    Mother: Dead  at 68 PE post shoulder surgery     (09/26/2007)  Social History:  Married    Alcohol use-seldom    Drug use-no    Former Smoker     (10/29/2006)  Risk Factors:    Alcohol Use: N/A    >5 drinks/d w/in last 3 months: N/A    Caffeine Use: N/A    Diet: N/A    Exercise: N/A  Risk Factors:    Smoking Status: quit (10/29/2006)    Packs/Day: N/A    Cigars/wk: N/A    Pipe Use/wk: N/A    Cans of tobacco/wk: N/A    Passive Smoke Exposure: N/A  Review of Systems      See HPI  Physical Exam  Additional Exam:     Knee Exam  General:    Above average weight.    Gait:    currently using a cane because she says she frequently loses her balance  Skin:    3 skin lesions in the lower spine they are slightly raised but no fluid is seen within  Inspection:    inspection of her knees reveals no joint swelling but swelling along the medial pes anserine bursa  Palpation:    there is tenderness over both bursa as well as over the patellar tendons  Vascular:    There is significant peripheral edema with pitting  Motor:    Motor strength 5/5 bilaterally for quadriceps, hamstrings, ankle dorsiflexion, and ankle plantar flexion.    Knee Exam:    Right:    Inspection:  Abnormal    Palpation:  Abnormal    Stability:  stable    Range of Motion:       Flexion-Active: 120 degrees       Extension-Active: full    Left:    Inspection:  Abnormal    Palpation:  Abnormal    Stability:  stable    Range of Motion:       Flexion-Active: 120 degrees       Extension-Active: full   McMurray's sign is negative bilaterally   Impression & Recommendations:  Problem # 1:  BACK PAIN (ICD-724.5)  The following medications were removed from the medication list:    Lortab 5 5-500 Mg Tabs (Hydrocodone-acetaminophen) .Marland Kitchen... 1/2 q 4 hrs prn    Celebrex 200 Mg Caps (Celecoxib) ..... One a day Her updated medication list for this problem includes:    Aspirin 81 Mg Tbec (Aspirin) ..... Once daily    Flexeril 10 Mg Tabs (Cyclobenzaprine hcl) .Marland Kitchen... 1 by mouth  three times a day as needed  Orders: Est. Patient Level III (25956)  Problem # 2:  ANSERINE BURSITIS (ICD-726.61)  Assessment:   3 views of the knee:    right     FINDINGS: there is no evidence of disease IMPR: normal knee   Acute exacerbation of busitis and patella tendoniits from aerobic exercise  REC: short course of prednisone   Orders: Est. Patient Level III (38756) Knee x-ray,  3 views (43329) Knee x-ray,  3 views (51884)  Medications Added to Medication List This Visit: 1)  Prednisone 5 Mg Tabs (Prednisone) .... 2 tabs daily x 5 days then 1 a day for 5 days  Patient Instructions: 1)  3 months  2)  knee re-check (no xrays)  Prescriptions: PREDNISONE 5 MG TABS (PREDNISONE) 2 tabs daily x 5 days then 1 a day for 5 days  #15 x 1   Entered and Authorized by:   Fuller Canada MD   Signed by:   Fuller Canada MD on 12/24/2008   Method used:   Print then Give to Patient   RxID:   [id number] PREDNISONE 5 MG TABS (PREDNISONE) 2 tabs daily x 5 days then 1 a day for 5 days  #15 x 1   Entered and Authorized by:   Fuller Canada MD   Signed by:   Fuller Canada MD on 12/24/2008   Method used:   Print then Give to Patient   RxID:   [id number]

## 2010-10-21 NOTE — Assessment & Plan Note (Signed)
Summary: 3 mo xr rt knee/bsf    PCP:  [name]   History of Present Illness: I saw Sara [name] in the office today for a 3 month followup visit.  She is a 65 years old woman with the complaint of:  right knee.  DOS 07-21-07. Arthroscopy of right knee for lateral meniscus tear and osteoarthritis.  Patient is due for an xray today. Patient states that she has pain every day, sometimes her pain radiates to her outer thigh.  Pain level today is around a 4, with some swelling.  She uses ice for swelling and this helps.  She takes extra strength tylenol for pain and this does not help much.  She has been using SOL formula 818 spray to help for knee pain, helps some. This spray is used for instant pain relief.   Prior Medications :  LEVOTHYROXINE SODIUM 100 MCG TABS (LEVOTHYROXINE SODIUM) 1 by mouth once daily LISINOPRIL-HYDROCHLOROTHIAZIDE 20-12.5 MG TABS (LISINOPRIL-HYDROCHLOROTHIAZIDE) once daily ASPIRIN 81 MG TBEC (ASPIRIN) once daily XYZAL 5 MG TABS (LEVOCETIRIZINE DIHYDROCHLORIDE) One by mouth daily NEURONTIN 100 MG CAPS (GABAPENTIN) One at bedtime LIPITOR 20 MG  TABS (ATORVASTATIN CALCIUM) One daily    Current Allergies (reviewed today): ! DARVON  Past Medical History:    Reviewed history from 09/07/2006 and no changes required:       Diverticulosis, colon       GERD       Hyperlipidemia       Hypertension       Hypothyroidism       Osteoarthritis  Past Surgical History:    Reviewed history from 09/26/2007 and no changes required:       Hysterectomy       Lumpectomy       Tonsillectomy       Right knee arthroscopy August 2008 - Dr. Alison Murray: 08/29/07 - EF 82% - no ischemia     Review of Systems      See HPI   Physical Exam  Skin:     intact without lesions or rashes Psych:     alert and cooperative; normal mood and affect; normal attention span and concentration   Knee Exam  General:    Well-developed,  well-nourished, normal body habitus; no deformities, normal grooming.  Gait:    Normal heel-toe gait pattern bilaterally.    Vascular:    There was no swelling or varicose veins. The pulses and temperature are normal. There was no edema or tenderness.  Knee Exam:    Right:    Inspection:  Abnormal    Palpation:  Abnormal    Stability:  stable    Tenderness:  ITB and patella tendon     Swelling:  no    Erythema:  no    Range of Motion:       Flexion-Active: full       Extension-Active: full       Flexion-Passive: full       Extension-Passive: full    Left:    Stability:       Tenderness:       Swelling:       Erythema:        Range of Motion:       Flexion-Active: full       Extension-Active: full       Flexion-Passive: full       Extension-Passive: full    Impression & Recommendations:  Problem # 1:  KNEE PAIN (ICD-719.46)  Her updated medication list for this problem includes:    Aspirin 81 Mg Tbec (Aspirin) ..... Once daily  Orders: Knee x-ray,  3 views (04540): fairly normal x-rays  Est. Patient Level III (98119)  Orders: Knee x-ray,  3 views (14782) Est. Patient Level III (95621)   Problem # 2:  PATELLAR TENDINITIS (ICD-726.64) also ITB  since she has had an ulcer we are avoiding NSAIDS, I discussed injection vs oral steroids short course. she decided to try the oral first. Orders: Est. Patient Level III (30865)   Medications Added to Medication List This Visit: 1)  Sterapred 10mg  Dose Pack  .... As directed for 12 days   Patient Instructions: 1)  Please schedule a follow-up appointment in 2 weeks.    Prescriptions: STERAPRED 10MG  DOSE PACK as directed for 12 days  #1 pack x 1   Entered by:   Waldon Reining   Authorized by:   Fuller Canada MD   Signed by:   Waldon Reining on 10/19/2007   Method used:   Print then Give to Patient   RxID:   [id number]  ]

## 2010-10-21 NOTE — Assessment & Plan Note (Signed)
Summary: 1 M RE-CK LOW BACK,NECK FOL'G PT/M.C.DISCOUNT/CAF    PCP:  [name]   History of Present Illness: I saw Sara [name] in the office today for a followup visit.  She is a 65 years old woman with the complaint of:  one month recheck lower back and neck after PT, sterapred, neurontin and Lortab.  the patient has not improved with the medicines as listed. She had physical therapy and finish that. She has pain in her right leg which radiates down the leg into the foot and is quite severe and does not ever go away.  She has to flex her spine to get relief. She cannot stand or walk for any period of time.         Updated Prior Medication List: LEVOTHROID 112 MCG  TABS (LEVOTHYROXINE SODIUM) One daily LISINOPRIL-HYDROCHLOROTHIAZIDE 20-12.5 MG TABS (LISINOPRIL-HYDROCHLOROTHIAZIDE) once daily ASPIRIN 81 MG TBEC (ASPIRIN) once daily XYZAL 5 MG TABS (LEVOCETIRIZINE DIHYDROCHLORIDE) One by mouth daily NEURONTIN 100 MG CAPS (GABAPENTIN) One at bedtime LIPITOR 20 MG  TABS (ATORVASTATIN CALCIUM) One daily PRILOSEC 20 MG  CPDR (OMEPRAZOLE) 1 by mouth q day NEURONTIN 100 MG  CAPS (GABAPENTIN) 1 by mouth three times a day NABUMETONE 500 MG  TABS (NABUMETONE) two times a day * STERAPRED 10MG  DOSE PACK FOR 12 DAYS as directed NEURONTIN 300 MG  CAPS (GABAPENTIN) one by mouth tid CIPRO 500 MG  TABS (CIPROFLOXACIN HCL) two times a day PYRIDIUM 200 MG  TABS (PHENAZOPYRIDINE HCL) three times a day LORTAB 5 5-500 MG  TABS (HYDROCODONE-ACETAMINOPHEN) 1/2 q 4 hrs prn  Current Allergies: ! DARVON  Past Medical History:    Reviewed history from 09/07/2006 and no changes required:       Diverticulosis, colon       GERD       Hyperlipidemia       Hypertension       Hypothyroidism       Osteoarthritis  Past Surgical History:    Reviewed history from 09/26/2007 and no changes required:       Hysterectomy       Lumpectomy       Tonsillectomy       Right knee arthroscopy  August 2008 - Dr. Alison Murray: 08/29/07 - EF 82% - no ischemia      Physical Exam  well-developed well-nourished female grooming and hygiene intact no other examination done today.    Impression & Recommendations:  Problem # 1:  SPINAL STENOSIS, LUMBAR (ICD-724.02) Assessment: Deteriorated worsening spinal stenosis with classic symptoms of limited distance on ambulation and flexion of the spine to get relief.   Orders: Est. Patient Level II (16109)    Patient Instructions: 1)  MRI L spine  2)  return to schedule ESI   Prescriptions: STERAPRED 10MG  DOSE PACK FOR 12 DAYS as directed  #1 pack x 1   Entered and Authorized by:   Fuller Canada MD   Signed by:   Fuller Canada MD on 03/21/2008   Method used:   Print then Give to Patient   RxID:   [id number]  ]

## 2010-10-21 NOTE — Assessment & Plan Note (Signed)
Summary: 6 week follow up/arc   Vital Signs:  Patient Profile:   65 Years Old Female Height:     66 inches (167.64 cm) Weight:      240 pounds BMI:     38.88 O2 Sat:      99 % Pulse rate:   84 / minute Resp:     14 per minute BP sitting:   128 / 82  Vitals Entered By: Sherilyn Banker (August 08, 2008 11:04 AM)                 PCP:  [name]  Chief Complaint:  follow up visit.  History of Present Illness: Pt in for recheck.  She had recent bout of gastro. This hs cleared. She states her apetite is not the best and states food does not taste all that good. She denies nausea and vomititng and notes no diarrhea or constipation. States sometimes feels uneasy on stoach in am and when she has first sip of water feels tlike it is coming back. She does have heartburn and states worse since steroid use for back and weight gain. Not on any Rx. Weight is steady at 240 pounds. She is not exersizing. She has a caring spouse and he helps her monitor diet now.   She has HTN and BP hs improved with taking Lisinopril double dose. She denies side-effects excet for peeing a little more. She denies chest pain, SOB, orthopnea, PND and palpitations. She is trying to watch salt.   She cont to have pain with her DDD in her neck and low back. She states till hurts and symptoms aweful with rain. States has numbness and tingling in arms and legs and she notes  cont on Lortab and Flexeril and gets some relief. She still has no insurance and husband notes hopeful to be CNA soon - takes boards Dec 12 an if this passes he will be able to work and get insurance. She denies loss of fucntion and states legs feels like giving out at times. States hrd to do anything. She has recheck with Dr. Romeo Apple in [place] as well.  Her allergies re bothering her. She has mild frontal headache,clear liquid rhinorhea an sneezing. Denies fever, chills and sweats. No ear pain or sore throat. No self Rx. Wants samples of  something.  She now presents.   Hypertension History:      She denies headache, chest pain, palpitations, dyspnea with exertion, orthopnea, PND, peripheral edema, visual symptoms, neurologic problems, syncope, and side effects from treatment.  She notes no problems with any antihypertensive medication side effects.  Further comments include: Tolerating Rx.        Positive major cardiovascular risk factors include female age 32 years old or older, hyperlipidemia, and hypertension.  Negative major cardiovascular risk factors include no history of diabetes, negative family history for ischemic heart disease, and non-tobacco-user status.        Further assessment for target organ damage reveals no history of ASHD, stroke/TIA, or peripheral vascular disease.    Lipid Management History:      Positive NCEP/ATP III risk factors include female age 26 years old or older and hypertension.  Negative NCEP/ATP III risk factors include no history of early menopause without estrogen hormone replacement, non-diabetic, no family history for ischemic heart disease, non-tobacco-user status, no ASHD (atherosclerotic heart disease), no prior stroke/TIA, no peripheral vascular disease, and no history of aortic aneurysm.        The patient states that  she knows about the "Therapeutic Lifestyle Change" diet.  Her compliance with the TLC diet is fair.  The patient expresses understanding of adjunctive measures for cholesterol lowering.  Adjunctive measures started by the patient include aerobic exercise, fiber, and omega-3 supplements.  She expresses no side effects from her lipid-lowering medication.  The patient denies any symptoms to suggest myopathy or liver disease from her "statin" therapy.  Comments: Agrees with lab draw. .      Prior Medications Reviewed Using: Patient Recall  Updated Prior Medication List: LEVOTHROID 112 MCG  TABS (LEVOTHYROXINE SODIUM) One daily LISINOPRIL-HYDROCHLOROTHIAZIDE 20-12.5 MG TABS  (LISINOPRIL-HYDROCHLOROTHIAZIDE) two daily ASPIRIN 81 MG TBEC (ASPIRIN) once daily PRAVACHOL 40 MG  TABS (PRAVASTATIN SODIUM) One daily NEURONTIN 300 MG  CAPS (GABAPENTIN) one by mouth tid LORTAB 5 5-500 MG  TABS (HYDROCODONE-ACETAMINOPHEN) 1/2 q 4 hrs prn * STANDARD CANE to use while walking FLEXERIL 10 MG TABS (CYCLOBENZAPRINE HCL) 1 by mouth three times a day as needed MECLIZINE HCL 25 MG TABS (MECLIZINE HCL) One every 6 hours as needed for dizzyness  Current Allergies (reviewed today): ! DARVON  Past Medical History:    Reviewed history from 05/29/2008 and no changes required:       Current Problems:        TRANSAMINASES, SERUM, ELEVATED (ICD-790.4)       H N P-LUMBAR (ICD-722.10)       CYSTITIS, ACUTE (ICD-595.0)       SPINAL STENOSIS, CERVICAL (ICD-723.0)       SPINAL STENOSIS, LUMBAR (ICD-724.02)       PATELLAR TENDINITIS (ICD-726.64)       KNEE PAIN (ICD-719.46)       ARM PAIN, LEFT (ICD-729.5)       ANSERINE BURSITIS, RIGHT (ICD-726.61)       ? of SLEEP APNEA (ICD-780.57)       TEAR LATERAL MENISCUS (ICD-836.1)       OBESITY NOS (ICD-278.00)       ALLERGIC RHINITIS, SEASONAL (ICD-477.0)       FIBROCYSTIC BREAST DISEASE (ICD-610.1)       DEGENERATION, DISC NOS (ICD-722.6)       OVERACTIVE BLADDER (ICD-596.51)       PUD (ICD-533.90)       OSTEOARTHRITIS (ICD-715.90)       HYPOTHYROIDISM (ICD-244.9)       HYPERTENSION (ICD-401.9)       HYPERLIPIDEMIA (ICD-272.4)       GERD (ICD-530.81)       DIVERTICULOSIS, COLON (ICD-562.10)         Past Surgical History:    Reviewed history from 09/26/2007 and no changes required:       Hysterectomy       Lumpectomy       Tonsillectomy       Right knee arthroscopy August 2008 - Dr. Alison Murray: 08/29/07 - EF 82% - no ischemia   Family History:    Reviewed history from 09/26/2007 and no changes required:       Father: Dead 35 CAD       Mother: Dead  at 30 PE post shoulder surgery    Review of Systems       See HPI  Resp      Denies cough, shortness of breath, sputum productive, and wheezing.  GI      See HPI  GU      Denies nocturia, urinary frequency, and urinary hesitancy.      Had mammo and normal.  Declines breast exam.   Physical Exam  General:     Well-developed,well-nourished,in no acute distress; alert,appropriate and cooperative throughout examination. Walks with cane. Eyes:     No corneal or conjunctival inflammation noted. EOMI. Perrla.  Ears:     External ear exam shows no significant lesions or deformities.  Otoscopic examination reveals clear canals, tympanic membranes are intact bilaterally without bulging, retraction, inflammation or discharge. Hearing is grossly normal bilaterally. Nose:     Mod turbinmate swelling. Clear liquid. Mouth:     Oral mucosa and oropharynx without lesions or exudates.   Lungs:     Normal respiratory effort, chest expands symmetrically. Lungs are clear to auscultation, no crackles or wheezes. Heart:     Normal rate and regular rhythm. S1 and S2 normal without gallop, murmur, click, rub or other extra sounds. Abdomen:     Bowel sounds positive and mildly increased,abdomen soft and non-tender without masses, organomegaly or hernias noted. Extremities:     No clubbing, cyanosis, edema, or deformity noted with normal full range of motion of all joints.   Psych:     Cognition and judgment appear intact. Alert and cooperative with normal attention span and concentration. No apparent delusions, illusions, hallucinations    Impression & Recommendations:  Problem # 1:  GASTROENTERITIS, ACUTE (ICD-558.9) Resolved. Follow and update if recurs. The following medications were removed from the medication list:    Meclizine Hcl 25 Mg Tabs (Meclizine hcl) ..... One every 6 hours as needed for dizzyness   Problem # 2:  GERD (ICD-530.81) Start trialKapidex as we have samples and cost major concern. Advised avoidance of NSAIDS. Handout given from  [facility]. Recheck 6 weeks.  Problem # 3:  SPINAL STENOSIS, CERVICAL (ICD-723.0) Needs to see neurosurgery. Cannot afford. Rx as is for now with fleexeril and Lortab. See Ortho for L-spine disease. Update if worsen or goto ED.  Problem # 4:  OBESITY NOS (ICD-278.00) TLC is a must. Aware or spinal benefits with weight loss. Husband assisting. Walk 20 minutes daily. Limit portions to 1500 calories.  Problem # 5:  HYPERTENSION (ICD-401.9) Rx as is. Check renal function and lytes. Her updated medication list for this problem includes:    Lisinopril-hydrochlorothiazide 20-12.5 Mg Tabs (Lisinopril-hydrochlorothiazide) .Marland Kitchen..Marland Kitchen Two daily  Orders: T-Comprehensive Metabolic Panel [id number]) T-CBC w/Diff ([id number])   Problem # 6:  HYPERLIPIDEMIA (ICD-272.4) Statin as is. Repeat yealy lab and LFTS. Her updated medication list for this problem includes:    Pravachol 40 Mg Tabs (Pravastatin sodium) ..... One daily  Orders: T-Comprehensive Metabolic Panel [phone]) T-Lipid Profile [phone])   Problem # 7:  ALLERGIC RHINITIS, SEASONAL (ICD-477.0) Trail Maxiphed. Change home air filter. If Rx helps, try OTC Lortadine.  Problem # 8:  Preventive Health Care (ICD-V70.0) Mammo up to date. Vaccines complete. Will consider H1N1.  Complete Medication List: 1)  Levothroid 112 Mcg Tabs (Levothyroxine sodium) .... One daily 2)  Lisinopril-hydrochlorothiazide 20-12.5 Mg Tabs (Lisinopril-hydrochlorothiazide) .... Two daily 3)  Aspirin 81 Mg Tbec (Aspirin) .... Once daily 4)  Pravachol 40 Mg Tabs (Pravastatin sodium) .... One daily 5)  Neurontin 300 Mg Caps (Gabapentin) .... One by mouth tid 6)  Lortab 5 5-500 Mg Tabs (Hydrocodone-acetaminophen) .... 1/2 q 4 hrs prn 7)  Standard Cane  .... To use while walking 8)  Flexeril 10 Mg Tabs (Cyclobenzaprine hcl) .Marland Kitchen.. 1 by mouth three times a day as needed  Hypertension Assessment/Plan:      The patient's hypertensive risk group is category B: At least  one risk factor (excluding diabetes) with no target organ damage.  Her calculated 10 year risk of coronary heart disease is 7 %.  Today's blood pressure is 128/82.  Her blood pressure goal is < 140/90.  Lipid Assessment/Plan:      Based on NCEP/ATP III, the patient's risk factor category is "2 or more risk factors and a calculated 10 year CAD risk of < 20%".  From this information, the patient's calculated lipid goals are as follows: Total cholesterol goal is 200; LDL cholesterol goal is 130; HDL cholesterol goal is 40; Triglyceride goal is 150.     Patient Instructions: 1)  Please schedule a follow-up appointment in 6 weeks.   ]  Preventive Care Screening  Colonoscopy:    Next Due:  06/2011  Last Flu Shot:    Next Due:  06/2009  Mammogram:    Next Due:  06/2009

## 2010-10-21 NOTE — Progress Notes (Signed)
Summary: RX PPI  Phone Note Call from Patient Call back at Home Phone [phone]   Caller: Patient Call For: NURSE Summary of Call: PATIENT WANTING RX FOR DEXILANT SINCE SHE'S BEEN GETTING SAMPLES. NURSE ADVISED HER TO USE OTC PRILOSEC.  Initial call taken by: [name],  June 11, 2009 3:56 PM  Follow-up for Phone Call        Agree as will not be able to afford this without insurance. Follow-up by: Franchot Heidelberg MD,  June 11, 2009 3:58 PM

## 2010-10-21 NOTE — Letter (Signed)
Summary: flowsheets  flowsheets   Imported By: Altamease Oiler 06/05/2009 13:39:31  _____________________________________________________________________  External Attachment:    Type:   Image     Comment:   External Document

## 2010-10-21 NOTE — Miscellaneous (Signed)
Summary: referral to [facility], already is his pt, faxed notes  Clinical Lists Changes  Orders: Added new Referral order of Neurosurgeon Referral (Neurosurgeon) - Signed  I faxed referral and notes

## 2010-10-21 NOTE — Progress Notes (Signed)
Summary: Wants appt for palpitatons  Phone Note Call from Patient   Summary of Call: patient called in and wanted to be seen in the morning with her husband I asked her what was going on and she said she was having heart palpation's and didn't know why, I told her if they got any worse she needed to go to the ER she stated she knew this.  Initial call taken by: Curtis Sites,  Feb 05, 2009 2:44 PM  Follow-up for Phone Call        Pt coming in today. Follow-up by: Sherilyn Banker LPN,  Feb 06, 2009 8:09 AM

## 2010-10-21 NOTE — Assessment & Plan Note (Signed)
Summary: FOLLOW UP 6 WEEK/SLJ   Vital Signs:  Patient Profile:   65 Years Old Female Height:     66 inches (167.64 cm) Weight:      237 pounds BMI:     38.39 O2 Sat:      99 % Pulse rate:   82 / minute Resp:     14 per minute BP sitting:   138 / 86  Vitals Entered By: Sherilyn Banker (September 25, 2008 2:44 PM)                 PCP:  [name]  Chief Complaint:  follow up visit.  History of Present Illness: Pt in for recheck..  She has DDD C-spine and L-spine. She states sees Dr. Romeo Apple on Monday for eval of her l-spine. I am managing neck. She states both areas are bad but neck seems worse. She states pain in neck is a 4/10 when resting and when active a 12. She has no pain in arms but states right hand has tingling in finger tips. Adds palm of hand hurts. She does not lift much as spouse fills in and she notes has not dropped objects. She has no isnurance  and states cannot afford to see neurosurgery. Husband has applied for CNA job and waiting for response on applications. She states her low back hurts too. States has radiation to left and right butt and legs. She cont with cane use and states fell few weeks ago. She states not sure what to do. She cont on Flexeril, Lortab and Neurontin. Does not like taking pills. Se needs a repeat TSH. She states Kapidex did great for heartburn and stomach upset. She would like more.  She now presents.  Hypertension History:      She denies headache, chest pain, palpitations, dyspnea with exertion, orthopnea, PND, peripheral edema, visual symptoms, neurologic problems, syncope, and side effects from treatment.  She notes no problems with any antihypertensive medication side effects.  Further comments include: Tolerating Rx.        Positive major cardiovascular risk factors include female age 35 years old or older, hyperlipidemia, and hypertension.  Negative major cardiovascular risk factors include no history of diabetes, negative  family history for ischemic heart disease, and non-tobacco-user status.        Further assessment for target organ damage reveals no history of ASHD, stroke/TIA, or peripheral vascular disease.    Lipid Management History:      Positive NCEP/ATP III risk factors include female age 55 years old or older and hypertension.  Negative NCEP/ATP III risk factors include no history of early menopause without estrogen hormone replacement, non-diabetic, no family history for ischemic heart disease, non-tobacco-user status, no ASHD (atherosclerotic heart disease), no prior stroke/TIA, no peripheral vascular disease, and no history of aortic aneurysm.        The patient states that she knows about the "Therapeutic Lifestyle Change" diet.  Her compliance with the TLC diet is fair.  The patient expresses understanding of adjunctive measures for cholesterol lowering.  Adjunctive measures started by the patient include fiber.  She expresses no side effects from her lipid-lowering medication.  The patient denies any symptoms to suggest myopathy or liver disease from her "statin" therapy.  Comments: Not really exersizing. States not hungry and has lost 3 pounds. She states cannot exersizie with pain. Husband present and wants to know about doing ROM exersizes. She does feel PT may help and is agreeable with referal for this.  States may sign up for [employer] .      Prior Medications Reviewed Using: Patient Recall  Prior Medication List:  LEVOTHROID 112 MCG  TABS (LEVOTHYROXINE SODIUM) One daily LISINOPRIL-HYDROCHLOROTHIAZIDE 20-12.5 MG TABS (LISINOPRIL-HYDROCHLOROTHIAZIDE) two daily ASPIRIN 81 MG TBEC (ASPIRIN) once daily PRAVACHOL 40 MG  TABS (PRAVASTATIN SODIUM) One daily NEURONTIN 300 MG  CAPS (GABAPENTIN) one by mouth tid LORTAB 5 5-500 MG  TABS (HYDROCODONE-ACETAMINOPHEN) 1/2 q 4 hrs prn * STANDARD CANE to use while walking FLEXERIL 10 MG TABS (CYCLOBENZAPRINE HCL) 1 by mouth three times a day as  needed KAPIDEX 60 MG CPDR (DEXLANSOPRAZOLE) once daily   Updated Prior Medication List: LEVOTHROID 112 MCG  TABS (LEVOTHYROXINE SODIUM) One daily LISINOPRIL-HYDROCHLOROTHIAZIDE 20-12.5 MG TABS (LISINOPRIL-HYDROCHLOROTHIAZIDE) two daily ASPIRIN 81 MG TBEC (ASPIRIN) once daily PRAVACHOL 40 MG  TABS (PRAVASTATIN SODIUM) One daily NEURONTIN 300 MG  CAPS (GABAPENTIN) one by mouth tid LORTAB 5 5-500 MG  TABS (HYDROCODONE-ACETAMINOPHEN) 1/2 q 4 hrs prn * STANDARD CANE to use while walking FLEXERIL 10 MG TABS (CYCLOBENZAPRINE HCL) 1 by mouth three times a day as needed KAPIDEX 60 MG CPDR (DEXLANSOPRAZOLE) once daily  Current Allergies (reviewed today): ! DARVON     Review of Systems      See HPI   Physical Exam  General:     Well-developed,well-nourished,in no acute distress; alert,appropriate and cooperative throughout examination. Walks with cane. Neck:     Decreased ROM neck, weakness arms 4/5. Pain on ROM. Lungs:     Normal respiratory effort, chest expands symmetrically. Lungs are clear to auscultation, no crackles or wheezes. Heart:     Normal rate and regular rhythm. S1 and S2 normal without gallop, murmur, click, rub or other extra sounds.    Impression & Recommendations:  Problem # 1:  SPINAL STENOSIS, CERVICAL (ICD-723.0) Discussed. Refer PT. I will call Dr. Cassandria Santee office as patient states thinks may be willing to take payment plan for eval. Advised synmptoms of progressive neurologic disease and need for ED eval if this occurs. Councelled importance of getting to se neurosurgery. She is aware. Nurse spoke with Dr. Jeral Fruit and states will need $200 up front and will work out payment program after this. Husabnd notes may get tax return and will call me after 1st of February to let me know if they can afford this. If this is case will refer immediately.  Orders: Physical Therapy Referral (PT)   Problem # 2:  SPINAL STENOSIS, LUMBAR (ICD-724.02) See Ortho as  scheduled. Orders: Physical Therapy Referral (PT)   Problem # 3:  TRANSAMINASES, SERUM, ELEVATED (ICD-790.4) Repeat and optomize. Kapidex as is for GI upset. Orders: T-Hepatic Function [phone])   Problem # 4:  HYPOTHYROIDISM (ICD-244.9) Repeat lab to assue stable. Optomize once result reviewed. Her updated medication list for this problem includes:    Levothroid 125 Mcg Tabs (Levothyroxine sodium) ..... One daily  Orders: T-TSH ([id number])   Problem # 5:  OBESITY NOS (ICD-278.00) TLOC again advised. States cannot exersize with pain. Advised caloric limitation.  Problem # 6:  HYPERTENSION (ICD-401.9) Stable. Rx as below. Her updated medication list for this problem includes:    Lisinopril-hydrochlorothiazide 20-12.5 Mg Tabs (Lisinopril-hydrochlorothiazide) .Marland Kitchen..Marland Kitchen Two daily   Complete Medication List: 1)  Levothroid 125 Mcg Tabs (Levothyroxine sodium) .... One daily 2)  Lisinopril-hydrochlorothiazide 20-12.5 Mg Tabs (Lisinopril-hydrochlorothiazide) .... Two daily 3)  Aspirin 81 Mg Tbec (Aspirin) .... Once daily 4)  Pravachol 40 Mg Tabs (Pravastatin sodium) .... One daily 5)  Neurontin 300 Mg  Caps (Gabapentin) .... One by mouth tid 6)  Lortab 5 5-500 Mg Tabs (Hydrocodone-acetaminophen) .... 1/2 q 4 hrs prn 7)  Standard Cane  .... To use while walking 8)  Flexeril 10 Mg Tabs (Cyclobenzaprine hcl) .Marland Kitchen.. 1 by mouth three times a day as needed 9)  Kapidex 60 Mg Cpdr (Dexlansoprazole) .... Once daily  Hypertension Assessment/Plan:      The patient's hypertensive risk group is category B: At least one risk factor (excluding diabetes) with no target organ damage.  Her calculated 10 year risk of coronary heart disease is 9 %.  Today's blood pressure is 138/86.  Her blood pressure goal is < 140/90.  Lipid Assessment/Plan:      Based on NCEP/ATP III, the patient's risk factor category is "2 or more risk factors and a calculated 10 year CAD risk of < 20%".  From this information,  the patient's calculated lipid goals are as follows: Total cholesterol goal is 200; LDL cholesterol goal is 130; HDL cholesterol goal is 40; Triglyceride goal is 150.     Patient Instructions: 1)  Please schedule a follow-up appointment in 3 months.   ]

## 2010-10-21 NOTE — Progress Notes (Signed)
Summary: knee swollen/hurting  Phone Note Call from Patient   Summary of Call: Sara [name] (December 02, 2045) says her knee is swollen and hurting even while taking the predisone and neurontin.  She is also taking Tylenol, but it does not help either. Uses [facility] in [place] if you want to call insomething else.  Her # is [phone] Initial call taken by: Jacklynn Ganong,  March 07, 2008 11:02 AM  Follow-up for Phone Call        lortab 5 mg  take 1/2 tab q 4 hrs as needed   # 40  Follow-up by: Fuller Canada MD,  March 12, 2008 8:43 AM    New/Updated Medications: LORTAB 5 5-500 MG  TABS (HYDROCODONE-ACETAMINOPHEN) 1/2 q 4 hrs prn   Prescriptions: LORTAB 5 5-500 MG  TABS (HYDROCODONE-ACETAMINOPHEN) 1/2 q 4 hrs prn  #40 x 0   Entered by:   Ether Griffins   Authorized by:   Fuller Canada MD   Signed by:   Ether Griffins on 03/12/2008   Method used:   Handwritten   RxID:   [id number]

## 2010-10-21 NOTE — Miscellaneous (Signed)
Summary: functional capacity evaluation summary sheet   functional capacity evaluation summary sheet   Imported By: Jacklynn Ganong 01/10/2009 15:02:08  _____________________________________________________________________  External Attachment:    Type:   Image     Comment:   External Document

## 2010-10-21 NOTE — Letter (Signed)
Summary: disablility determination services  disablility determination services   Imported By: Curtis Sites 02/08/2008 13:23:59  _____________________________________________________________________  External Attachment:    Type:   Image     Comment:   External Document

## 2010-10-21 NOTE — Assessment & Plan Note (Signed)
Summary: mri results from [facility]    PCP:  [name]   History of Present Illness: I saw Sara [name] in the office today for a followup visit.  She is a 65 years old woman with the complaint of:  MRI results from [facility] of the L spine taken on 03-27-08   recheck lower back and neck after PT, sterapred, neurontin and Lortab.  the patient has not improved with the medicines as listed. She had physical therapy and finish that. She has pain in her right leg which radiates down the leg into the foot and is quite severe and does not ever go away.  She has to flex her spine to get relief. She cannot stand or walk for any period of time.  MRI results.  Right posterolateral disc herniation at L5-S1 that could compress the right S1 nerve root.  Facet arthropathy at this level as well, right worse than left.   L4-5:  Broad-based disc herniation without definite neural compression.  Mild narrowing of the lateral recesses.   L3-4:  Facet arthropathy.  2 mm of anterolisthesis.  Shallow protrusion of disc material.  No definite neural compression.           Current Allergies: ! DARVON        Impression & Recommendations:  Problem # 1:  SPINAL STENOSIS, LUMBAR (ICD-724.02) Assessment: Comment Only  Orders: Est. Patient Level III (62130)   Problem # 2:  H N P-LUMBAR (ICD-722.10) Assessment: Comment Only She is not improved in any way. We discussed her MRI results after I reviewed the films and the report. I explained to her the best I could in layman's terms. I recommended epidural injection. Orders: Est. Patient Level III ([id number])   Problem # 3:  KNEE PAIN (ICD-719.46) Assessment: Comment Only stable right knee pain at this time. Her updated medication list for this problem includes:    Aspirin 81 Mg Tbec (Aspirin) ..... Once daily    Nabumetone 500 Mg Tabs (Nabumetone) .Marland Kitchen..Marland Kitchen Two times a day    Lortab 5 5-500 Mg Tabs (Hydrocodone-acetaminophen) .Marland Kitchen... 1/2 q 4  hrs prn  Orders: Est. Patient Level III ([id number])    Patient Instructions: 1)  ESI L5-S1 2)  return if no improvement after 2 shots    ]

## 2010-10-21 NOTE — Progress Notes (Signed)
Summary: MRI referral  Phone Note Outgoing Call   Call placed by: Sonny Dandy,  Feb 02, 2008 10:56 AM Summary of Call: appointment set for MRI on 02/08/08 at 830/900am, order faxed. patient called, message left for patient to return call on answering machine Sonny Dandy  Feb 02, 2008 10:57 AM   Referral date/time given to patient Sonny Dandy  Feb 02, 2008 11:09 AM  Initial call taken by: Sonny Dandy,  Feb 02, 2008 11:09 AM

## 2010-10-21 NOTE — Progress Notes (Signed)
Summary: sleep study referral  Phone Note Outgoing Call   Call placed by: Sonny Dandy,  July 11, 2007 1:18 PM Summary of Call: patient informed that sleep lab will call her with date/time for appointment Initial call taken by: Sonny Dandy,  July 11, 2007 1:18 PM

## 2010-10-21 NOTE — Letter (Signed)
Summary: Generic Letter, Intro to Referring  [facility]  [address]   [place], [place] [address]   Phone: [phone]  Fax: [phone]      Jan 25, 2009             RE: Sara [name]   [birth year]                 [address]                 [place], [place]  [address]  Dear Appt Secretary,    This pt has been scheduled an appt for 03/11/2009 @ 11:00 w/ Tana Coast, PA.   Pt is aware of appt.         Sincerely,    Elinor Parkinson  [facility] Ph: [phone]   Fax: [phone]

## 2010-10-21 NOTE — Assessment & Plan Note (Signed)
Summary: follow up/arc   Vital Signs:  Patient Profile:   65 Years Old Female Height:     66 inches (167.64 cm) Weight:      224 pounds BMI:     36.29 O2 Sat:      98 % Pulse rate:   90 / minute Resp:     12 per minute BP sitting:   134 / 91  Vitals Entered By: Sherilyn Banker (February 24, 2008 9:03 AM)                 PCP:  [name]  Chief Complaint:  follow up visit.  History of Present Illness: Pt in for recheck.  She has been having a lot of neck and back pain. She states pain starts right neck base and goes down back. She states low back does same and has numbness down right leg. SHe denies falls and injuries. She is right hand dominant. She states right leg feels weak and she did see Dr. Romeo Apple of Ortho. Sent for PT and states doing three times a week. She is about to finish steroid Rx and adds started Neurontin three times a day under the care of Dr. Romeo Apple. States pain is aweful at times - 10/10. Using Relafen as well and states does help some.  SHe had neck MRI and this is reviewed:  1.  Severe degenerative cervical spondylosis with associated severe disc disease and facet disease.  Multilevel, multifactorial spinal and foraminal stenosis as specifically discussed above at the individual levels. 2.  Mild tonsillar ectopia of 4.5 mm. 3.  Normal MR appearance of the cervical spinal cord.  She sees Dr. Romeo Apple again March 21, 2008.  She notes her second concern is urinary frequency. Has had sx for several days now. States worse last night. FGeels she has to go every hour at night. SHe states urine smells strong but does not burn. She notes no pelvic or spurapubic pain and states back hurts. States has not self treated. She is sexually active.  She is applying for disability based on back and neck.  She now presents.  Hypertension History:      She denies headache, chest pain, palpitations, dyspnea with exertion, orthopnea, PND, peripheral edema, visual  symptoms, neurologic problems, syncope, and side effects from treatment.        Positive major cardiovascular risk factors include female age 84 years old or older, hyperlipidemia, and hypertension.  Negative major cardiovascular risk factors include no history of diabetes, negative family history for ischemic heart disease, and non-tobacco-user status.        Further assessment for target organ damage reveals no history of ASHD, stroke/TIA, or peripheral vascular disease.       Prior Medications Reviewed Using: Patient Recall  Updated Prior Medication List: LEVOTHROID 112 MCG  TABS (LEVOTHYROXINE SODIUM) One daily LISINOPRIL-HYDROCHLOROTHIAZIDE 20-12.5 MG TABS (LISINOPRIL-HYDROCHLOROTHIAZIDE) once daily ASPIRIN 81 MG TBEC (ASPIRIN) once daily XYZAL 5 MG TABS (LEVOCETIRIZINE DIHYDROCHLORIDE) One by mouth daily NEURONTIN 100 MG CAPS (GABAPENTIN) One at bedtime LIPITOR 20 MG  TABS (ATORVASTATIN CALCIUM) One daily PRILOSEC 20 MG  CPDR (OMEPRAZOLE) 1 by mouth q day NEURONTIN 100 MG  CAPS (GABAPENTIN) 1 by mouth three times a day NABUMETONE 500 MG  TABS (NABUMETONE) two times a day * STERAPRED 10MG  DOSE PACK FOR 12 DAYS as directed NEURONTIN 300 MG  CAPS (GABAPENTIN) one by mouth tid  Current Allergies (reviewed today): ! DARVON  Past Medical History:    Reviewed history  from 09/07/2006 and no changes required:       Diverticulosis, colon       GERD       Hyperlipidemia       Hypertension       Hypothyroidism       Osteoarthritis  Past Surgical History:    Reviewed history from 09/26/2007 and no changes required:       Hysterectomy       Lumpectomy       Tonsillectomy       Right knee arthroscopy August 2008 - Dr. Alison Murray: 08/29/07 - EF 82% - no ischemia   Family History:    Reviewed history from 09/26/2007 and no changes required:       Father: Dead 42 CAD       Mother: Dead  at 10 PE post shoulder surgery  Social History:    Reviewed history from  10/29/2006 and no changes required:       Married       Alcohol use-seldom       Drug use-no       Former Smoker   Risk Factors: Tobacco use:  quit    Year quit:  1987    Pack-years:  27 Drug use:  no Alcohol use:  no  Family History Risk Factors:    Family History of MI in females < 44 years old:  no    Family History of MI in males < 16 years old:  no  Colonoscopy History:    Date of Last Colonoscopy:  06/23/2006  Mammogram History:    Date of Last Mammogram:  07/11/2007  PAP Smear History:    Date of Last PAP Smear:  10/29/2006   Review of Systems      See HPI   Physical Exam  General:     Well-developed,well-nourished,in no acute distress; alert,appropriate and cooperative throughout examination Lungs:     Normal respiratory effort, chest expands symmetrically. Lungs are clear to auscultation, no crackles or wheezes. Heart:     Normal rate and regular rhythm. S1 and S2 normal without gallop, murmur, click, rub or other extra sounds. Abdomen:     Bowel sounds positive,abdomen soft and non-tender without masses, organomegaly or hernias noted. Extremities:     No clubbing, cyanosis, edema, or deformity noted with normal full range of motion of all joints.   Neurologic:     Back pain and sitting upright on table with grimmace when asked to lie down. Has paraspinous mucle spasm and tender C-spine, L-spine and T-spine. Neg SLT to 60 degrees. Reflexes 2 + and symmetric - perhaps mildly decreased lower legs. Sensation grossly intact. Cervical Nodes:     No lymphadenopathy noted Psych:     Cognition and judgment appear intact. Alert and cooperative with normal attention span and concentration. No apparent delusions, illusions, hallucinations    Impression & Recommendations:  Problem # 1:  SPINAL STENOSIS, CERVICAL (ICD-723.0) Per Dr. Romeo Apple. Reviewed MRI result and advised Rx as is with PT, NSAID and Neurtontin. Discussed nurosurgery consult and pain clinic evals  and advised need to see how she responds to conservative Rx first. Agrees. She will see Dr. Alto Denver March 21, 2008 ads scheduled. Sooner if sx worsen.  Problem # 2:  SPINAL STENOSIS, LUMBAR (ICD-724.02) See above.  Problem # 3:  HYPERTENSION (ICD-401.9) BP elevated. Suspect pain and UTI as cause. Councelled diet, exersize and low salt intake. Recheck 2 months. Her updated  medication list for this problem includes:    Lisinopril-hydrochlorothiazide 20-12.5 Mg Tabs (Lisinopril-hydrochlorothiazide) ..... Once daily  Orders: UA Dipstick w/o Micro (manual) (04540)   Problem # 4:  CYSTITIS, ACUTE (ICD-595.0) Discussed. Rx as below. Push fluids, cranberry juice and post coital voiding.Agrees. Update if worse. Her updated medication list for this problem includes:    Cipro 500 Mg Tabs (Ciprofloxacin hcl) .Marland Kitchen..Marland Kitchen Two times a day    Pyridium 200 Mg Tabs (Phenazopyridine hcl) .Marland Kitchen... Three times a day   Complete Medication List: 1)  Levothroid 112 Mcg Tabs (Levothyroxine sodium) .... One daily 2)  Lisinopril-hydrochlorothiazide 20-12.5 Mg Tabs (Lisinopril-hydrochlorothiazide) .... Once daily 3)  Aspirin 81 Mg Tbec (Aspirin) .... Once daily 4)  Xyzal 5 Mg Tabs (Levocetirizine dihydrochloride) .... One by mouth daily 5)  Neurontin 100 Mg Caps (Gabapentin) .... One at bedtime 6)  Lipitor 20 Mg Tabs (Atorvastatin calcium) .... One daily 7)  Prilosec 20 Mg Cpdr (Omeprazole) .Marland Kitchen.. 1 by mouth q day 8)  Neurontin 100 Mg Caps (Gabapentin) .Marland Kitchen.. 1 by mouth three times a day 9)  Nabumetone 500 Mg Tabs (Nabumetone) .... Two times a day 10)  Sterapred 10mg  Dose Pack For 12 Days  .... As directed 11)  Neurontin 300 Mg Caps (Gabapentin) .... One by mouth tid 12)  Cipro 500 Mg Tabs (Ciprofloxacin hcl) .... Two times a day 13)  Pyridium 200 Mg Tabs (Phenazopyridine hcl) .... Three times a day  Hypertension Assessment/Plan:      The patient's hypertensive risk group is category B: At least one risk factor  (excluding diabetes) with no target organ damage.  Her calculated 10 year risk of coronary heart disease is 9 %.  Today's blood pressure is 134/91.  Her blood pressure goal is < 140/90.   Patient Instructions: 1)  Please schedule a follow-up appointment in 2 months.   Prescriptions: PYRIDIUM 200 MG  TABS (PHENAZOPYRIDINE HCL) three times a day  #6 x 0   Entered and Authorized by:   Franchot Heidelberg MD   Signed by:   Franchot Heidelberg MD on 02/24/2008   Method used:   Print then Give to Patient   RxID:   [id number] CIPRO 500 MG  TABS (CIPROFLOXACIN HCL) two times a day  #10 x 0   Entered and Authorized by:   Franchot Heidelberg MD   Signed by:   Franchot Heidelberg MD on 02/24/2008   Method used:   Print then Give to Patient   RxID:   [id number]  ] Laboratory Results   Urine Tests    Routine Urinalysis   Color: yellow Appearance: Clear Glucose: negative   (Normal Range: Negative) Bilirubin: negative   (Normal Range: Negative) Ketone: negative   (Normal Range: Negative) Spec. Gravity: 1.010   (Normal Range: 1.003-1.035) Blood: trace-intact   (Normal Range: Negative) pH: 6.5   (Normal Range: 5.0-8.0) Protein: negative   (Normal Range: Negative) Urobilinogen: 0.2   (Normal Range: 0-1) Nitrite: positive   (Normal Range: Negative) Leukocyte Esterace: moderate   (Normal Range: Negative)

## 2010-10-21 NOTE — Miscellaneous (Signed)
Summary: Rehab Report  Rehab Report   Imported By: Jacklynn Ganong 03/27/2008 11:45:43  _____________________________________________________________________  External Attachment:    Type:   Image     Comment:   PT Progress note

## 2010-10-21 NOTE — Assessment & Plan Note (Signed)
Summary: 1 m RECHECK KNEE/FOL'G MEDICATION/SELF PAYvsMCDISCOUNT/CAF    PCP:  [name]   History of Present Illness: I saw Sara [name] in the office today for a followup visit.  She is a 65 years old woman with the complaint of:  one month recheck right knee pain after taking neurontin.  Knee pain is better.  Her back has been hurting from top of spine to right hip that radiates to right calf, for 2 weeks , no injury, had MRI of neck was done, to see Fonnie Jarvis tomorrow for results.  the pain in her leg seems to be more symptomatic. It is radicular in nature. No giving way symptoms yet in the leg. I suspect that some of her knee pain was coming from this all along and is now more problematic. The fact that she got better with Neurontin is suggestive of a neurogenic origin of her pain.    Updated Prior Medication List: LEVOTHROID 112 MCG  TABS (LEVOTHYROXINE SODIUM) One daily LISINOPRIL-HYDROCHLOROTHIAZIDE 20-12.5 MG TABS (LISINOPRIL-HYDROCHLOROTHIAZIDE) once daily ASPIRIN 81 MG TBEC (ASPIRIN) once daily XYZAL 5 MG TABS (LEVOCETIRIZINE DIHYDROCHLORIDE) One by mouth daily NEURONTIN 100 MG CAPS (GABAPENTIN) One at bedtime LIPITOR 20 MG  TABS (ATORVASTATIN CALCIUM) One daily PRILOSEC 20 MG  CPDR (OMEPRAZOLE) 1 by mouth q day NEURONTIN 100 MG  CAPS (GABAPENTIN) 1 by mouth three times a day NABUMETONE 500 MG  TABS (NABUMETONE) two times a day * STERAPRED 10MG  DOSE PACK FOR 12 DAYS as directed NEURONTIN 300 MG  CAPS (GABAPENTIN) one by mouth tid  Current Allergies: ! DARVON  Past Medical History:    Reviewed history from 09/07/2006 and no changes required:       Diverticulosis, colon       GERD       Hyperlipidemia       Hypertension       Hypothyroidism       Osteoarthritis  Past Surgical History:    Reviewed history from 09/26/2007 and no changes required:       Hysterectomy       Lumpectomy       Tonsillectomy       Right knee arthroscopy August 2008 - Dr.  Alison Murray: 08/29/07 - EF 82% - no ischemia      Physical Exam  GENERAL: Normal appearance CDV: normal temp, color and pulses PSYCHE: Awake and alert NEURO: normal sensation  MSK: neck examination  Decreased range of motion on rotation right and left decreased extension pain with flexion with crepitance reflexes are normal strength is normal muscle tone is normal Hoffman's test are negative     Impression & Recommendations:  Problem # 1:  SPINAL STENOSIS, LUMBAR (ICD-724.02)  Orders: Physical Therapy Referral (PT) Est. Patient Level III (91478)   Problem # 2:  SPINAL STENOSIS, CERVICAL (ICD-723.0) The MRI was done and on local hospital.The report and the films have been reviewed. IMPRESSION:   1.  Severe degenerative cervical spondylosis with associated severe disc disease and facet disease.  Multilevel, multifactorial spinal and foraminal stenosis as specifically discussed above at the individual levels. 2.  Mild tonsillar ectopia of 4.5 mm. 3.  Normal MR appearance of the cervical spinal cord.  her neck seems to be okay at this point  Her back seems to be the problem. I will go ahead and institute nonsurgical management.  increase Neurontin 300 mg 3 times a day at steroid Dosepak Orders: Physical Therapy Referral (PT) Est. Patient  Level III (86578)   Medications Added to Medication List This Visit: 1)  Sterapred 10mg  Dose Pack For 12 Days  .... As directed 2)  Neurontin 300 Mg Caps (Gabapentin) .... One by mouth tid   Patient Instructions: 1)  Take medication as directed 2)  Next 2 days bedrest, flat on back, pillows under legs. 3)  PT to start 02-20-08 for neck and back. 4)  Please schedule a follow-up appointment in 1 month.   Prescriptions: NEURONTIN 300 MG  CAPS (GABAPENTIN) one by mouth tid  #90 x 1   Entered and Authorized by:   Fuller Canada MD   Signed by:   Fuller Canada MD on 02/15/2008   Method used:   Print then  Give to Patient   RxID:   [id number] 10MG  DOSE PACK FOR 12 DAYS as directed  #1 pack x 1   Entered and Authorized by:   Fuller Canada MD   Signed by:   Fuller Canada MD on 02/15/2008   Method used:   Print then Give to Patient   RxID:   [id number]  ]

## 2010-10-21 NOTE — Letter (Signed)
Summary: consents  consents   Imported By: Altamease Oiler 06/05/2009 13:36:17  _____________________________________________________________________  External Attachment:    Type:   Image     Comment:   External Document

## 2010-10-21 NOTE — Miscellaneous (Signed)
Summary: Rehab Report  Rehab Report   Imported By: Jacklynn Ganong 03/02/2008 10:17:18  _____________________________________________________________________  External Attachment:    Type:   Image     Comment:   PT evaluation report

## 2010-10-21 NOTE — Letter (Signed)
Summary: progress notes  progress notes   Imported By: Altamease Oiler 06/05/2009 13:43:01  _____________________________________________________________________  External Attachment:    Type:   Image     Comment:   External Document

## 2010-10-21 NOTE — Progress Notes (Signed)
Summary: Office Visit  Office Visit   Imported By: Eldridge Dace 07/29/2007 09:55:09  _____________________________________________________________________  External Attachment:    Type:   Image     Comment:   initial visit

## 2010-10-21 NOTE — Assessment & Plan Note (Signed)
Summary: SWELLING/SLJ   Vital Signs:  Patient Profile:   65 Years Old Female Height:     66 inches (167.64 cm) Weight:      235 pounds O2 Sat:      93 % O2 treatment:    Room Air Pulse rate:   104 / minute Resp:     10 per minute BP sitting:   130 / 84  (left arm)  Vitals Entered By: Lutricia Horsfall LPN (November 20, 6043 10:56 AM)                 PCP:  [name]  Chief Complaint:  followup.  History of Present Illness: Pt in for recheck.  She is in with spouse.   She is in with concern about a knot on her right lateral shin. States nurning ache and pain.Comes and goes. Rates as 10/10 at times. She states she has had others feel and states no-one lse feels it. She states burnibg goes to ant shin and worried. 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 She states when she walks feels like low back slips between hip bones. States back hurts and states while sitting here is doing well but once she gets busy hurts. (Spouse jokes about sex). She states using pillow between legs and helps a lot.  She cont with PT and helps back more than the neck. She needs to see Neurosurgery and dfoes not have money to do so. She sees Dr. Romeo Apple for L-spine disease and me for C-spine. States sees Dr. Romeo Apple in 6 months.She is on Neuronitn and Celebrex per chart review.  States does make her a little drowsy and she states was using 900 mg at night.  She denies falls and injuries. No weakness. Parastsheia arms and legs.  She now presents.   Hypertension History:      She denies headache, chest pain, palpitations, dyspnea with exertion, orthopnea, PND, peripheral edema, visual symptoms, neurologic problems, syncope, and side effects from treatment.  She notes no problems with any antihypertensive medication side effects.        Positive major cardiovascular risk factors include female age 58 years old or older, hyperlipidemia, and hypertension.  Negative major  cardiovascular risk factors include no history of diabetes, negative family history for ischemic heart disease, and non-tobacco-user status.        Further assessment for target organ damage reveals no history of ASHD, stroke/TIA, or peripheral vascular disease.    Lipid Management History:      Positive NCEP/ATP III risk factors include female age 50 years old or older and hypertension.  Negative NCEP/ATP III risk factors include no history of early menopause without estrogen hormone replacement, non-diabetic, no family history for ischemic heart disease, non-tobacco-user status, no ASHD (atherosclerotic heart disease), no prior stroke/TIA, no peripheral vascular disease, and no history of aortic aneurysm.        The patient states that she knows about the "Therapeutic Lifestyle Change" diet.  Her compliance with the TLC diet is fair.  The patient expresses understanding of adjunctive measures for cholesterol lowering.  She expresses no side effects from her lipid-lowering medication.  The patient denies any symptoms to suggest myopathy or liver disease from her "statin" therapy.        Prior Medications Reviewed Using: Patient Recall  Updated Prior Medication List: LEVOTHROID 112 MCG TABS (LEVOTHYROXINE SODIUM) once daily LISINOPRIL-HYDROCHLOROTHIAZIDE 20-12.5 MG TABS (LISINOPRIL-HYDROCHLOROTHIAZIDE) two daily ASPIRIN 81 MG  TBEC (ASPIRIN) once daily PRAVACHOL 40 MG  TABS (PRAVASTATIN SODIUM) One daily NEURONTIN 300 MG  CAPS (GABAPENTIN) one by mouth tid LORTAB 5 5-500 MG  TABS (HYDROCODONE-ACETAMINOPHEN) 1/2 q 4 hrs prn * STANDARD CANE to use while walking FLEXERIL 10 MG TABS (CYCLOBENZAPRINE HCL) 1 by mouth three times a day as needed NEURONTIN 600 MG TABS (GABAPENTIN) 1 tablet three times a day CELEBREX 200 MG CAPS (CELECOXIB) one a day  Current Allergies (reviewed today): ! DARVON  Past Medical History:    Reviewed history from 05/29/2008 and no changes required:       Current  Problems:        TRANSAMINASES, SERUM, ELEVATED (ICD-790.4)       H N P-LUMBAR (ICD-722.10)       CYSTITIS, ACUTE (ICD-595.0)       SPINAL STENOSIS, CERVICAL (ICD-723.0)       SPINAL STENOSIS, LUMBAR (ICD-724.02)       PATELLAR TENDINITIS (ICD-726.64)       KNEE PAIN (ICD-719.46)       ARM PAIN, LEFT (ICD-729.5)       ANSERINE BURSITIS, RIGHT (ICD-726.61)       ? of SLEEP APNEA (ICD-780.57)       TEAR LATERAL MENISCUS (ICD-836.1)       OBESITY NOS (ICD-278.00)       ALLERGIC RHINITIS, SEASONAL (ICD-477.0)       FIBROCYSTIC BREAST DISEASE (ICD-610.1)       DEGENERATION, DISC NOS (ICD-722.6)       OVERACTIVE BLADDER (ICD-596.51)       PUD (ICD-533.90)       OSTEOARTHRITIS (ICD-715.90)       HYPOTHYROIDISM (ICD-244.9)       HYPERTENSION (ICD-401.9)       HYPERLIPIDEMIA (ICD-272.4)       GERD (ICD-530.81)       DIVERTICULOSIS, COLON (ICD-562.10)         Past Surgical History:    Reviewed history from 09/26/2007 and no changes required:       Hysterectomy       Lumpectomy       Tonsillectomy       Right knee arthroscopy August 2008 - Dr. Alison Murray: 08/29/07 - EF 82% - no ischemia   Family History:    Reviewed history from 09/26/2007 and no changes required:       Father: Dead 65 CAD       Mother: Dead  at 44 PE post shoulder surgery  Social History:    Reviewed history from 10/29/2006 and no changes required:       Married       Alcohol use-seldom       Drug use-no       Former Smoker    Review of Systems      See HPI  Endo      Needs repeat TSH after dose adjust.    Physical Exam  General:     Well-developed,well-nourished,in no acute distress; alert,appropriate and cooperative throughout examination. Walks with cane. Lungs:     Normal respiratory effort, chest expands symmetrically. Lungs are clear to auscultation, no crackles or wheezes. Heart:     Normal rate and regular rhythm. S1 and S2 normal without gallop, murmur, click, rub or  other extra sounds. Abdomen:     Bowel sounds positive and mildly increased,abdomen soft and non-tender without masses, organomegaly or hernias noted. Extremities:     No clubbing, cyanosis, edema, or  deformity noted with normal full range of motion of all joints.   Neurologic:     Back pain and sitting upright on table with grimmace when asked to lie down. Has paraspinous mucle spasm and tender C-spine, L-spine and T-spine. Neg SLT to 60 degrees. Reflexes 2 + and symmetric - perhaps mildly decreased lower legs. Sensation grossly intact. Cervical Nodes:     No lymphadenopathy noted Psych:     Cognition and judgment appear intact. Alert and cooperative with normal attention span and concentration. No apparent delusions, illusions, hallucinations    Impression & Recommendations:  Problem # 1:  SPINAL STENOSIS, LUMBAR (ICD-724.02) Increase nerontin to 600 mg three times a day as directed. Monitor forsedation.Update if cocnerns. Recheck 6 weeks. Again, save money to see neurosurgery as no insurance. Cont PT. Clinically looks bets she has on long time today.  Problem # 2:  TRANSAMINASES, SERUM, ELEVATED (ICD-790.4) Repeat LFTS in 6 weeks. Avoid ETOH, OTC meds.  Problem # 3:  HYPERTENSION (ICD-401.9) Stable. TLC diet. Her updated medication list for this problem includes:    Lisinopril-hydrochlorothiazide 20-12.5 Mg Tabs (Lisinopril-hydrochlorothiazide) .Marland Kitchen..Marland Kitchen Two daily   Problem # 4:  HYPOTHYROIDISM (ICD-244.9) Repeat TSH and optomize.  Her updated medication list for this problem includes:    Levothroid 112 Mcg Tabs (Levothyroxine sodium) ..... Once daily  Orders: T-TSH (60454-09811)   Problem # 5:  OBESITY NOS (ICD-278.00) Councelld weight loss with diet, exersize and low fat intake.  Complete Medication List: 1)  Levothroid 112 Mcg Tabs (Levothyroxine sodium) .... Once daily 2)  Lisinopril-hydrochlorothiazide 20-12.5 Mg Tabs (Lisinopril-hydrochlorothiazide) .... Two daily 3)   Aspirin 81 Mg Tbec (Aspirin) .... Once daily 4)  Pravachol 40 Mg Tabs (Pravastatin sodium) .... One daily 5)  Neurontin 300 Mg Caps (Gabapentin) .... One by mouth tid 6)  Lortab 5 5-500 Mg Tabs (Hydrocodone-acetaminophen) .... 1/2 q 4 hrs prn 7)  Standard Cane  .... To use while walking 8)  Flexeril 10 Mg Tabs (Cyclobenzaprine hcl) .Marland Kitchen.. 1 by mouth three times a day as needed 9)  Neurontin 600 Mg Tabs (Gabapentin) .Marland Kitchen.. 1 tablet three times a day 10)  Celebrex 200 Mg Caps (Celecoxib) .... One a day  Hypertension Assessment/Plan:      The patient's hypertensive risk group is category B: At least one risk factor (excluding diabetes) with no target organ damage.  Her calculated 10 year risk of coronary heart disease is 9 %.  Today's blood pressure is 130/84.  Her blood pressure goal is < 140/90.  Lipid Assessment/Plan:      Based on NCEP/ATP III, the patient's risk factor category is "2 or more risk factors and a calculated 10 year CAD risk of < 20%".  From this information, the patient's calculated lipid goals are as follows: Total cholesterol goal is 200; LDL cholesterol goal is 130; HDL cholesterol goal is 40; Triglyceride goal is 150.     Patient Instructions: 1)  Please schedule a follow-up appointment in 6 weeks.  Appended Document: SWELLING/SLJ [facility] consult faxed 11/22/08  Appended Document: SWELLING/SLJ appt schedule march 15th at 0930...bring actual films of MRI.[facility] Kitchen[facility]  Appended Document: SWELLING/SLJ Spoke with husband who states that [facility] called them yesterday and advised them of appt date and time.

## 2010-10-21 NOTE — Assessment & Plan Note (Signed)
Summary: follow up 6 week/slj   Vital Signs:  Patient profile:   65 year old female Height:      66 inches Weight:      233 pounds BMI:     37.74 O2 Sat:      96 % Pulse rate:   87 / minute Resp:     12 per minute BP sitting:   151 / 96  Vitals Entered By: Sherilyn Banker LPN (December 31, 2008 3:12 PM)  Primary Provider:  [name]  CC:  Follow-up.  History of Present Illness: Pt in for recheck.  She has DDD C and L-spine.  She saw Dr. Effie Shy of Neurosurgery on December 03, 2008 and she was switched from Celebrex to Naprosyn. She states took this religioulsy and saw him this am for follow-up. She states he told her if he could make her discs disappear it would still not change her quality of life issues. States being reffered for pain management now under his direction. She has right leg radiculoptahy and she sttes just miserable. She has hard time getting comfortable in bed and just feels rough. She states he did not chnage any Rx today and advised to stay with Neurontin, Vicodin, Flexeril and Naproxen. She adds she saw Dr. Romeo Apple last week with leg and knee pain. Told had OA, bursitus and tendonitis. She states Dr. Romeo Apple did xrays and started on Prednisone. He stopped her PT as he felt this was contributing. She had a FCE exam under them per Dr. Romeo Apple - see notes in EMR.  She states she has Medicaid for the nxt 4 weeks and states then disability kicks in. Medicare Eligable in January 2011. She states qworked closely with social security to get this. States was turned down twice as she has a degree in business administration. Then appealed and finally got hearing. Happy to be set now as was rough financially.  She needs re[eat TSH today after Rx adjustment 6 weeks ago. Denies dry skin, constipation.  She now presents.  Hypertension History:      She denies headache, chest pain, palpitations, dyspnea with exertion, orthopnea, PND, peripheral edema, visual symptoms, neurologic  problems, syncope, and side effects from treatment.  She notes no problems with any antihypertensive medication side effects.        Positive major cardiovascular risk factors include female age 70 years old or older, hyperlipidemia, and hypertension.  Negative major cardiovascular risk factors include no history of diabetes, negative family history for ischemic heart disease, and non-tobacco-user status.        Further assessment for target organ damage reveals no history of ASHD, stroke/TIA, or peripheral vascular disease.    Lipid Management History:      Positive NCEP/ATP III risk factors include female age 52 years old or older and hypertension.  Negative NCEP/ATP III risk factors include no history of early menopause without estrogen hormone replacement, non-diabetic, no family history for ischemic heart disease, non-tobacco-user status, no ASHD (atherosclerotic heart disease), no prior stroke/TIA, no peripheral vascular disease, and no history of aortic aneurysm.        The patient states that she does not know about the "Therapeutic Lifestyle Change" diet.  Her compliance with the TLC diet is fair.  The patient expresses understanding of adjunctive measures for cholesterol lowering.  She expresses no side effects from her lipid-lowering medication.  The patient denies any symptoms to suggest myopathy or liver disease.      Preventive Screening-Counseling & Management  Alcohol drinks/day: 0     Smoking Status: quit     Smoking Cessation Counseling: no     Smoke Cessation Stage: quit     Packs/Day: 1.5     Year Started: Age 89     Year Quit: 1987     Pack years: 45  Current Problems (verified): 1)  Back Pain  (ICD-724.5) 2)  Anserine Bursitis  (ICD-726.61) 3)  Gerd  (ICD-530.81) 4)  Benign Positional Vertigo  (ICD-386.11) 5)  H N P-lumbar  (ICD-722.10) 6)  Spinal Stenosis, Cervical  (ICD-723.0) 7)  Spinal Stenosis, Lumbar  (ICD-724.02) 8)  Patellar Tendinitis  (ICD-726.64) 9)  ?  of Sleep Apnea  (ICD-780.57) 10)  Tear Lateral Meniscus  (ICD-836.1) 11)  Obesity Nos  (ICD-278.00) 12)  Allergic Rhinitis, Seasonal  (ICD-477.0) 13)  Fibrocystic Breast Disease  (ICD-610.1) 14)  Degeneration, Disc Nos - C-spine and L-spine  (ICD-722.6) 15)  Overactive Bladder  (ICD-596.51) 16)  Hx of Pud  (ICD-533.90) 17)  Osteoarthritis  (ICD-715.90) 18)  Hypothyroidism  (ICD-244.9) 19)  Hypertension  (ICD-401.9) 20)  Hyperlipidemia  (ICD-272.4) 21)  Gerd  (ICD-530.81) 22)  Diverticulosis, Colon  (ICD-562.10)  Current Medications (verified): 1)  Levothroid 100 Mcg Tabs (Levothyroxine Sodium) .Marland Kitchen.. 1 By Mouth Once Daily 2)  Lisinopril-Hydrochlorothiazide 20-12.5 Mg Tabs (Lisinopril-Hydrochlorothiazide) .... Two Daily 3)  Aspirin 81 Mg Tbec (Aspirin) .... Once Daily 4)  Pravachol 40 Mg  Tabs (Pravastatin Sodium) .... One Daily 5)  Flexeril 10 Mg Tabs (Cyclobenzaprine Hcl) .Marland Kitchen.. 1 By Mouth Three Times A Day As Needed 6)  Neurontin 600 Mg Tabs (Gabapentin) .Marland Kitchen.. 1 Tablet Three Times A Day 7)  Naprosyn 500 Mg Tabs (Naproxen) .... Two Times A Day 8)  Vicodin 5-500 Mg Tabs (Hydrocodone-Acetaminophen) .... One Daily For 6 Weeks  Allergies (verified): 1)  ! Darvon  Past History:  Past Surgical History:    1. Hysterectomy    2. Lumpectomy    3. Tonsillectomy    4. Right knee arthroscopy August 2008 - Dr. Romeo Apple    5. Stress Myoview: 08/29/07 - EF 82% - no ischemia  Family History:    Father: Dead 47 CAD    Mother: Dead  at 85 PE post shoulder surgery    Brothers - 2x - age 37  DDD, DJD knees and 3 - healthy    Sisters - 3 sisters - age 69 CAD and CVA and DDD, 31 DDD, 22 ?    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 See Dr. Effie Shy and pain clinic as set. Urge weight loss, exersize. Rx as is for now. Agrees.  Problem # 2:  HYPOTHYROIDISM (ICD-244.9) Repeat TSH and adjust dose as needed. Her updated medication list for this problem includes:    Levothroid 100 Mcg Tabs (Levothyroxine sodium) .Marland Kitchen... 1 by mouth once daily  Orders: T-TSH (16109-60454) Venipuncture (09811)  Problem # 3:  HYPERTENSION (ICD-401.9) BP high. States busy day and some pain.Not open to Rx changes. Check LFTS on statin as well as renal fucntion and lytes.  Limit salt, lose weight and recheck 3 months. Her updated medication list for this problem includes:    Lisinopril-hydrochlorothiazide 20-12.5 Mg Tabs (Lisinopril-hydrochlorothiazide) .Marland Kitchen..Marland Kitchen Two daily  Orders: T-Comprehensive Metabolic Panel (16109-60454) Venipuncture (09811)  Problem # 4:  MORBID OBESITY (ICD-278.01) Dicussed. Urged exersize, portion control.Advisedhow weight negatively impcts back and other joints. Aware and states will try. Encouraged possibloy swimming as this neutralizes weight and lessens pressur eon back and other joints.  Complete Medication List: 1)  Levothroid 100 Mcg Tabs (Levothyroxine sodium) .Marland Kitchen.. 1 by mouth once daily 2)  Lisinopril-hydrochlorothiazide 20-12.5 Mg Tabs (Lisinopril-hydrochlorothiazide) .... Two daily 3)  Aspirin 81 Mg Tbec (Aspirin) .... Once daily 4)  Pravachol 40 Mg Tabs (Pravastatin sodium) .... One daily 5)  Flexeril 10 Mg Tabs (Cyclobenzaprine hcl) .Marland Kitchen.. 1 by mouth three times a day as needed 6)  Neurontin 600 Mg Tabs (Gabapentin) .Marland Kitchen.. 1 tablet three times a day 7)  Naprosyn 500 Mg Tabs (Naproxen) .... Two times a day 8)  Vicodin 5-500 Mg Tabs (Hydrocodone-acetaminophen) .... One daily for 6 weeks  Hypertension Assessment/Plan:      The patient's hypertensive risk group is category B: At least one risk factor (excluding diabetes) with no target organ damage.   Her calculated 10 year risk of coronary heart disease is 13 %.  Today's blood pressure is 151/96.  Her blood pressure goal is < 140/90.  Lipid Assessment/Plan:      Based on NCEP/ATP III, the patient's risk factor category is "2 or more risk factors and a calculated 10 year CAD risk of < 20%".  From this information, the patient's calculated lipid goals are as follows: Total cholesterol goal is 200; LDL cholesterol goal is 130; HDL cholesterol goal is 40; Triglyceride goal is 150.    Patient Instructions: 1)  Please schedule a follow-up appointment in 3 months.  Appended Document: follow up 6 week/slj Pt scheduled for ultrasound on Monday, 19 Apr at 11:00.  Register at [facility] at 10:30.  Pt is to be NPO after midnight.  Left message for patient to contact clinic  Appended Document: follow up 6 week/slj patient made aware of appt time/date...and instructions...[phone].Marland KitchenMarland Kitchenarc

## 2010-10-21 NOTE — Progress Notes (Signed)
Summary: 07/19/07 lab results letter mailed  Phone Note Outgoing Call   Call placed by: Sonny Dandy,  July 20, 2007 12:00 PM Summary of Call: message left for patient to return call .................................................................Marland KitchenMarland KitchenBritt Bottom Long  July 20, 2007 12:00 PM   number not in service, letter mailed .................................................................Marland KitchenMarland KitchenSonny Dandy  July 21, 2007 10:37 AM  Initial call taken by: Sonny Dandy,  July 21, 2007 10:37 AM

## 2010-10-21 NOTE — Assessment & Plan Note (Signed)
Summary: vomitting since friday/no appetite/arc   Vital Signs:  Patient Profile:   65 Years Old Female Height:     66 inches (167.64 cm) Weight:      240 pounds BMI:     38.88 O2 Sat:      98 % Temp:     97.7 degrees F Pulse rate:   92 / minute Resp:     14 per minute BP sitting:   146 / 94  Vitals Entered By: Sherilyn Banker (July 16, 2008 1:20 PM)                 PCP:  [name]  Chief Complaint:  vomiting and diarrhea since Fri..  History of Present Illness: Pt in for acute visit.  She states has been sick since last Friday. She states she was lying down and states suddenly felt like room was spinning. She became sick on stoamch and threw up and had diarrhea. She states her dizzyness feels like world is spinning aroiund her and she notes feels like she is going to fall of the planet. She has a ull headache over her right temple. She states dull ache, rates as 5/10. Worse with nothing and better with nothing. She states had some ear fullness last week and cleared in 24 hours - none now. She denies runny nose and sore throat. States little scracthy with mild post nasald drip and cleer liquid. She denies tremors and falls. She has a hx of vertigo but has not had spell in years.  She states her apetite is low. She is nauseated and states threw up  last time yesterday evening. She is eating and had pancake and sausage. She denies abdominal pain but sttes full in epigastrium. SHe states feels like she has to  make a giant poop. She has lose stools and states stool orange and mooshy. Denies bloody stools. She states has pooped four times today.   She is drinking water and states feels like she will throw this up to.  She denies fever, chills and sweats. No ill contacts or travel. Does note a friend was sick last week but not sure what she had, ? flu. She has not self treated except for Pepto Bismol - helped some. Did not eat anywhere out of the ordinary.  She now  presents.    Prior Medications Reviewed Using: Patient Recall  Updated Prior Medication List: LEVOTHROID 112 MCG  TABS (LEVOTHYROXINE SODIUM) One daily LISINOPRIL-HYDROCHLOROTHIAZIDE 20-12.5 MG TABS (LISINOPRIL-HYDROCHLOROTHIAZIDE) two daily ASPIRIN 81 MG TBEC (ASPIRIN) once daily PRAVACHOL 40 MG  TABS (PRAVASTATIN SODIUM) One daily NEURONTIN 300 MG  CAPS (GABAPENTIN) one by mouth tid LORTAB 5 5-500 MG  TABS (HYDROCODONE-ACETAMINOPHEN) 1/2 q 4 hrs prn * STANDARD CANE to use while walking FLEXERIL 10 MG TABS (CYCLOBENZAPRINE HCL) 1 by mouth three times a day as needed  Current Allergies (reviewed today): ! DARVON    Risk Factors: Tobacco use:  quit    Year quit:  1987    Pack-years:  27 Drug use:  no Alcohol use:  no  Family History Risk Factors:    Family History of MI in females < 91 years old:  no    Family History of MI in males < 50 years old:  no  Colonoscopy History:    Date of Last Colonoscopy:  06/23/2006  Mammogram History:    Date of Last Mammogram:  06/27/2008  @  [facility]  PAP Smear History:    Date of Last  PAP Smear:  10/29/2006   Review of Systems      See HPI   Physical Exam  General:     Well-developed,well-nourished,in no acute distress; alert,appropriate and cooperative throughout examination. Hair dressed up. Moist mucous membranes. Head:     Normocephalic and atraumatic without obvious abnormalities. No apparent alopecia or balding. Eyes:     No corneal or conjunctival inflammation noted. EOMI. Perrla.  Ears:     External ear exam shows no significant lesions or deformities.  Otoscopic examination reveals clear canals, tympanic membranes are intact bilaterally without bulging, retraction, inflammation or discharge. Hearing is grossly normal bilaterally. Nose:     Mod turbinmate swelling. Clear liquid. Mouth:     Oral mucosa and oropharynx without lesions or exudates.   Neck:     No deformities, masses, or tenderness noted. Lungs:     Normal  respiratory effort, chest expands symmetrically. Lungs are clear to auscultation, no crackles or wheezes. Heart:     Normal rate and regular rhythm. S1 and S2 normal without gallop, murmur, click, rub or other extra sounds. Abdomen:     Bowel sounds positive and mildly increased,abdomen soft and non-tender without masses, organomegaly or hernias noted. Extremities:     No clubbing, cyanosis, edema, or deformity noted with normal full range of motion of all joints.   Cervical Nodes:     No lymphadenopathy noted Psych:     Cognition and judgment appear intact. Alert and cooperative with normal attention span and concentration. No apparent delusions, illusions, hallucinations    Impression & Recommendations:  Problem # 1:  GASTROENTERITIS, ACUTE (ICD-558.9) Discussed. Suspect two problems at same time. Advised fluid push, clear liquid diet and advnace as tolerated. Advised rest and assurance of adequate hydration. Single dose Phenrgan in office for nausea. Start Meclizine for Vertigo. Update in 48 hours if persist or worsen and if this is case, will need labs, possible iv hydration and also advised ED eval if signs worsen over night. Agrees. Her updated medication list for this problem includes:    Meclizine Hcl 25 Mg Tabs (Meclizine hcl) ..... One every 6 hours as needed for dizzyness  Orders: Promethazine up to 50mg  (J2550) Admin of Therapeutic Inj  intramuscular or subcutaneous (16109)   Problem # 2:  BENIGN POSITIONAL VERTIGO (ICD-386.11) Hx of same. Suspect signs recurrence. Start meclizine. Consider referal for extiction if signs persist and do not improve on Rx. Agrees. Her updated medication list for this problem includes:    Meclizine Hcl 25 Mg Tabs (Meclizine hcl) ..... One every 6 hours as needed for dizzyness   Complete Medication List: 1)  Levothroid 112 Mcg Tabs (Levothyroxine sodium) .... One daily 2)  Lisinopril-hydrochlorothiazide 20-12.5 Mg Tabs  (Lisinopril-hydrochlorothiazide) .... Two daily 3)  Aspirin 81 Mg Tbec (Aspirin) .... Once daily 4)  Pravachol 40 Mg Tabs (Pravastatin sodium) .... One daily 5)  Neurontin 300 Mg Caps (Gabapentin) .... One by mouth tid 6)  Lortab 5 5-500 Mg Tabs (Hydrocodone-acetaminophen) .... 1/2 q 4 hrs prn 7)  Standard Cane  .... To use while walking 8)  Flexeril 10 Mg Tabs (Cyclobenzaprine hcl) .Marland Kitchen.. 1 by mouth three times a day as needed 9)  Meclizine Hcl 25 Mg Tabs (Meclizine hcl) .... One every 6 hours as needed for dizzyness   Patient Instructions: 1)  Please schedule a follow-up appointment in 2 weeks.   Prescriptions: MECLIZINE HCL 25 MG TABS (MECLIZINE HCL) One every 6 hours as needed for dizzyness  #60 x 0  Entered and Authorized by:   Franchot Heidelberg MD   Signed by:   Franchot Heidelberg MD on 07/16/2008   Method used:   Print then Give to Patient   RxID:   [id number]  ]  Medication Administration  Injection # 1:    Medication: Promethazine up to 50mg     Diagnosis: GASTROENTERITIS, ACUTE (ICD-558.9)    Route: IM    Site: RUOQ gluteus    Exp Date: 11/19/2009    Lot #: [id number]    Mfr: novaplus    Patient tolerated injection without complications    Given by: Sherilyn Banker (July 16, 2008 1:49 PM)  Orders Added: 1)  Est. Patient Level III [69629] 2)  Promethazine up to 50mg  [J2550] 3)  Admin of Therapeutic Inj  intramuscular or subcutaneous [52841]

## 2010-10-21 NOTE — Letter (Signed)
Summary: [employer] Patient assistance form  [employer] Patient assistance form   Imported By: Cammie Sickle 11/14/2008 19:57:21  _____________________________________________________________________  External Attachment:    Type:   Image     Comment:   External Document

## 2010-10-21 NOTE — Letter (Signed)
Summary: NPP  NPP   Imported By: Altamease Oiler 06/05/2009 13:42:31  _____________________________________________________________________  External Attachment:    Type:   Image     Comment:   External Document

## 2010-10-21 NOTE — Progress Notes (Signed)
Summary: Office Visit  Office Visit   Imported By: Eldridge Dace 07/29/2007 09:56:29  _____________________________________________________________________  External Attachment:    Type:   Image     Comment:   office visit

## 2010-10-21 NOTE — Letter (Signed)
Summary: consents  consents   Imported By: Altamease Oiler 06/05/2009 13:45:19  _____________________________________________________________________  External Attachment:    Type:   Image     Comment:   External Document

## 2010-10-21 NOTE — Letter (Signed)
Summary: lab  lab   Imported By: Altamease Oiler 06/05/2009 13:40:07  _____________________________________________________________________  External Attachment:    Type:   Image     Comment:   External Document

## 2010-10-21 NOTE — Letter (Signed)
Summary: medical release for vocational rehab. services  medical release for vocational rehab. services   Imported By: Curtis Sites 09/10/2008 10:03:20  _____________________________________________________________________  External Attachment:    Type:   Image     Comment:   External Document

## 2010-10-21 NOTE — Letter (Signed)
Summary: med hx  med hx   Imported By: Altamease Oiler 06/05/2009 13:40:27  _____________________________________________________________________  External Attachment:    Type:   Image     Comment:   External Document

## 2010-10-21 NOTE — Progress Notes (Signed)
Summary: Right leg pain  Phone Note Call from Patient   Caller: Patient Reason for Call: Acute Illness Summary of Call: patient called and advised that her right leg hurts badly when she lies down - it is not her knee that was xrayed - but her leg.  when she sits, it begins to hurt but pain increases when she lies down. - is getting no sleep due to pain.    [phone] [phone] Initial call taken by: joan hall  Follow-up for Phone Call        We can possibly put her at 3:45. Do you want to do this?  Agree.  ..................................................................Marland KitchenFranchot Heidelberg MD  October 29, 2006 10:27 AM  Follow-up by: Sherilyn Banker,  October 29, 2006 9:58 AM

## 2010-10-21 NOTE — Assessment & Plan Note (Signed)
Summary: 4 M RE-CK/HAD ESI INJEC'S/100% MC DISC/CAF    PCP:  [name]   History of Present Illness: I saw Sara [name] in the office today for a 3 month followup visit.  She is a 65 years old woman with the complaint of:  back pain.  Follow up after esi series.  Patient states that she is still having pain. She does not take the Neurontin all the time.   previous medications: Neurontin, Lortab, Flexeril.   MRI SPINE:  IMPRESSION: Right posterolateral disc herniation at L5-S1 that could compress the right S1 nerve root.  Facet arthropathy at this level as well, right worse than left.   L4-5:  Broad-based disc herniation without definite neural compression.  Mild narrowing of the lateral recesses.   L3-4:  Facet arthropathy.  2 mm of anterolisthesis.  Shallow protrusion of disc material.  No definite neural compression.       Updated Prior Medication List: LEVOTHROID 112 MCG TABS (LEVOTHYROXINE SODIUM) once daily LISINOPRIL-HYDROCHLOROTHIAZIDE 20-12.5 MG TABS (LISINOPRIL-HYDROCHLOROTHIAZIDE) two daily ASPIRIN 81 MG TBEC (ASPIRIN) once daily PRAVACHOL 40 MG  TABS (PRAVASTATIN SODIUM) One daily NEURONTIN 300 MG  CAPS (GABAPENTIN) one by mouth tid LORTAB 5 5-500 MG  TABS (HYDROCODONE-ACETAMINOPHEN) 1/2 q 4 hrs prn * STANDARD CANE to use while walking FLEXERIL 10 MG TABS (CYCLOBENZAPRINE HCL) 1 by mouth three times a day as needed KAPIDEX 60 MG CPDR (DEXLANSOPRAZOLE) once daily NEURONTIN 600 MG TABS (GABAPENTIN) 1/2 tablet tid  Current Allergies (reviewed today): ! DARVON  Past Medical History:    Reviewed history from 05/29/2008 and no changes required:       Current Problems:        TRANSAMINASES, SERUM, ELEVATED (ICD-790.4)       H N P-LUMBAR (ICD-722.10)       CYSTITIS, ACUTE (ICD-595.0)       SPINAL STENOSIS, CERVICAL (ICD-723.0)       SPINAL STENOSIS, LUMBAR (ICD-724.02)       PATELLAR TENDINITIS (ICD-726.64)       KNEE PAIN (ICD-719.46)  ARM PAIN, LEFT (ICD-729.5)       ANSERINE BURSITIS, RIGHT (ICD-726.61)       ? of SLEEP APNEA (ICD-780.57)       TEAR LATERAL MENISCUS (ICD-836.1)       OBESITY NOS (ICD-278.00)       ALLERGIC RHINITIS, SEASONAL (ICD-477.0)       FIBROCYSTIC BREAST DISEASE (ICD-610.1)       DEGENERATION, DISC NOS (ICD-722.6)       OVERACTIVE BLADDER (ICD-596.51)       PUD (ICD-533.90)       OSTEOARTHRITIS (ICD-715.90)       HYPOTHYROIDISM (ICD-244.9)       HYPERTENSION (ICD-401.9)       HYPERLIPIDEMIA (ICD-272.4)       GERD (ICD-530.81)       DIVERTICULOSIS, COLON (ICD-562.10)         Past Surgical History:    Reviewed history from 09/26/2007 and no changes required:       Hysterectomy       Lumpectomy       Tonsillectomy       Right knee arthroscopy August 2008 - Dr. Alison Murray: 08/29/07 - EF 82% - no ischemia        Impression & Recommendations:  Problem # 1:  H N P-LUMBAR (ICD-722.10) After 2 epidurals she has made some improvement however she still has discomfort. She does not take Neurontin  because it makes her sleepy she does take it at times when she is significantly in pain. I discussed with her the fact that she's not a surgical candidate at this point because she is not in enough pain. I recommended activity modification we tried to do some more patient education and I recommended exercise, changed around the 600 mg at night. As far as her overall arthritic picture goes I recommended Celebrex one a day. She will call the company to see if she can get on her uninsured. Orders: Est. Patient Level II (64332)   Medications Added to Medication List This Visit: 1)  Neurontin 600 Mg Tabs (Gabapentin) .Marland Kitchen.. 1 hs 2)  Neurontin 600 Mg Tabs (Gabapentin) .... 1/2 tablet tid   Patient Instructions: 1)  exercise daily 2)  no heavy lifting 3)  take the medication at night  4)  Please schedule a follow-up appointment in 6 months.   Prescriptions: NEURONTIN 600 MG TABS  (GABAPENTIN) 1/2 tablet tid  #90 x 2   Entered and Authorized by:   Fuller Canada MD   Signed by:   Fuller Canada MD on 10/01/2008   Method used:   Faxed to ...       [facility] [address] (retail)       [address]       [place], [place]  [address]       Ph: [phone]       Fax: [phone]   RxID:   [id number] NEURONTIN 300 MG  CAPS (GABAPENTIN) one by mouth tid  #90 x 2   Entered and Authorized by:   Fuller Canada MD   Signed by:   Fuller Canada MD on 10/01/2008   Method used:   Print then Give to Patient   RxID:   [id number] NEURONTIN 600 MG TABS (GABAPENTIN) 1 hs  #90 x 5   Entered and Authorized by:   Fuller Canada MD   Signed by:   Fuller Canada MD on 10/01/2008   Method used:   Print then Give to Patient   RxID:   [id number]
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
## 2010-10-21 NOTE — Progress Notes (Signed)
Summary: 04/13/07 xray results  Phone Note Outgoing Call   Call placed by: Sonny Dandy,  April 13, 2007 4:47 PM Summary of Call: called, no answer .................................................................Marland KitchenMarland KitchenSonny Dandy  April 13, 2007 4:47 PM   Results given to patient, voices understanding  .................................................................Marland KitchenMarland KitchenSonny Dandy  April 14, 2007 9:06 AM  Initial call taken by: Sonny Dandy,  April 14, 2007 9:06 AM

## 2010-10-21 NOTE — Miscellaneous (Signed)
Summary: Orders Update  Clinical Lists Changes  Orders: Added new Referral order of Cardiology Referral (Cardiology) - Signed 

## 2010-10-21 NOTE — Progress Notes (Signed)
Summary: [place]  Phone Note Outgoing Call   Call placed by: Sherilyn Banker,  October 29, 2006 10:38 AM Call placed to: Patient Summary of Call: pt coming in at 3:45

## 2010-10-23 NOTE — Assessment & Plan Note (Signed)
Summary: **per Dr.Doonquah for dizziness and tilt table testing/tg   Visit Type:  Initial Consult Referring Provider:  [facility] Primary Provider:  Felecia Shelling  CC:  chest pain, dizziness, sob, cold legs and feet, and numbness in feet and legs.  History of Present Illness: Sara [name] is referred today by Dr Gerilyn Pilgrim for dizziness, question need of tilt table.  She is 65 year old black female who is disabled from chronic back problems and arthritis. She walks with a cane.  In the last several months, she's had intermittent vertigo. It is usually on predictable but clearly doesn't occur when she first stands up. Sometimes is worse in bed. It usually lasts about 30-45 seconds so she notices no change with position. She suffers from chronic congestion.  She's had some chest discomfort mid substernal at rest. She has a history of GERD. There is nonexertional component to it except sometimes when she has sex. It did not radiate. It is not associated with shortness of breath nausea or vomiting or sweats.  Recent EKG was normal. Date of EKG was November 11. Her PR QRS and QTC intervals are normal.  She is remote history of palpitations. Holter monitor at that time only showed a few PACs no PVCs. She denies any consistent palpitations.  Recent carotid Dopplers October 6 showed no significant disease with antegrade flow in both vertebrals.  She has a history of hypertension. She is on low-dose metoprolol and lisinopril HCTZ.  She is not a diabetic.  Preventive Screening-Counseling & Management  Alcohol-Tobacco     Smoking Status: quit     Year Quit: 1987  Current Medications (verified): 1)  Levoxyl 88 Mcg Tabs (Levothyroxine Sodium) .... Take 1 Tablet By Mouth Once A Day 2)  Lisinopril-Hydrochlorothiazide 20-12.5 Mg Tabs (Lisinopril-Hydrochlorothiazide) .... Take 2 Tablet By Mouth Daily 3)  Simvastatin 40 Mg Tabs (Simvastatin) .... Take One Tablet By Mouth Daily At Bedtime 4)  Flexeril 10 Mg  Tabs (Cyclobenzaprine Hcl) .Marland Kitchen.. 1 By Mouth Three Times A Day As Needed 5)  Neurontin 600 Mg Tabs (Gabapentin) .... 1/2 Tablet Two Times A Day 6)  Metoprolol Tartrate 25 Mg Tabs (Metoprolol Tartrate) .... One Two Times A Day 7)  Lortab 5 5-500 Mg Tabs (Hydrocodone-Acetaminophen) .... One By Mouth Q 6 Hrs As Needed Pain 8)  Fioricet 50-325-40 Mg Tabs (Butalbital-Apap-Caffeine) .Marland Kitchen.. 1-2 Tablet By Mouth Every 4 Hrs As Needed For Pain 9)  Diazepam 5 Mg Tabs (Diazepam) .... Take 1 Tablet By Mouth Three Times A Day As Needed 10)  Zantac 300 Mg Tabs (Ranitidine Hcl) .... Take 1 Tablet By Mouth Once A Day  At Bedtime 11)  Naproxen 500 Mg Tabs (Naproxen) .... Take 1 Tablet By Mouth Two Times A Day  Allergies (verified): 1)  ! Darvon  Past History:  Past Medical History: Last updated: 05/29/2008 Current Problems:  TRANSAMINASES, SERUM, ELEVATED (ICD-790.4) H N P-LUMBAR (ICD-722.10) CYSTITIS, ACUTE (ICD-595.0) SPINAL STENOSIS, CERVICAL (ICD-723.0) SPINAL STENOSIS, LUMBAR (ICD-724.02) PATELLAR TENDINITIS (ICD-726.64) KNEE PAIN (ICD-719.46) ARM PAIN, LEFT (ICD-729.5) ANSERINE BURSITIS, RIGHT (ICD-726.61) ? of SLEEP APNEA (ICD-780.57) TEAR LATERAL MENISCUS (ICD-836.1) OBESITY NOS (ICD-278.00) ALLERGIC RHINITIS, SEASONAL (ICD-477.0) FIBROCYSTIC BREAST DISEASE (ICD-610.1) DEGENERATION, DISC NOS (ICD-722.6) OVERACTIVE BLADDER (ICD-596.51) PUD (ICD-533.90) OSTEOARTHRITIS (ICD-715.90) HYPOTHYROIDISM (ICD-244.9) HYPERTENSION (ICD-401.9) HYPERLIPIDEMIA (ICD-272.4) GERD (ICD-530.81) DIVERTICULOSIS, COLON (ICD-562.10)  Past Surgical History: Last updated: 03/07/2009 1. Hysterectomy 2. Lumpectomy 3. Tonsillectomy 4. Right knee arthroscopy August 2008 - Dr. Romeo Apple 5. Stress Myoview: 08/29/07 - EF 82% - no ischemia  Family History: Last updated: 12/31/2008 Father:  Dead 28 CAD Mother: Dead  at 48 PE post shoulder surgery Brothers - 2x - age 59  DDD, DJD knees and 29 - healthy Sisters - 3  sisters - age 24 CAD and CVA and DDD, 46 DDD, 8 ? 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    Comparison:  None.    Criteria:  Quantification of carotid stenosis is based on velocity   parameters that correlate the residual internal carotid diameter   with NASCET-based stenosis levels, using the diameter of the distal   internal carotid lumen as the denominator for stenosis measurement.    The following velocity measurements were obtained:                     PEAK SYSTOLIC/END DIASTOLIC   RIGHT   ICA:                        101/21cm/sec   CCA:                        88/20cm/sec   SYSTOLIC ICA/CCA RATIO:     1.15   DIASTOLIC ICA/CCA RATIO:    1.04   ECA:                        64/9cm/sec    LEFT   ICA:                        84/28cm/sec   CCA:                        103/27cm/sec   SYSTOLIC ICA/CCA RATIO:     0.82   DIASTOLIC ICA/CCA RATIO:    1.04   ECA:                        64/9cm/sec    Findings:    RIGHT CAROTID ARTERY: No significant carotid atherosclerosis.  No   hemodynamically significant ICA stenosis, velocity elevation or   turbulent flow.    RIGHT VERTEBRAL ARTERY:  Antegrade    LEFT CAROTID ARTERY: No significant left carotid atherosclerosis.   No hemodynamically significant ICA stenosis, velocity elevation or   turbulent flow.    LEFT VERTEBRAL ARTERY:   Antegrade    IMPRESSION:   No significant carotid atherosclerosis.  No hemodynamically   significant ICA stenosis detected by ultrasound.    Read By:  Sigurd Sos.,  M.D.   Released By:  Sigurd Sos.,  M.D.  Nuclear Study:  11/20/2002:    STRESS TEST   INDICATIONS:  The patient is a 65 year old female with no known coronary  artery disease who presents with atypical chest discomfort.   BASELINE DATA:  EKG:  Sinus rhythm at 58 beats per minute with nonspecific  ST changes.  Blood pressure 128/78.   49 mg of Adenosine Cardiolite was infused over a four minute protocol.  Cardiolite was injected at three minutes.  The patient reported symptoms of  chest tightness, flushing, and headache.  All of these symptoms resolved in  the recovery period.  EKG showed several blocked beats; however, no ischemic  changes were noted.  The blocked beats were associated with the infusion and  were not seen in the recovery period.  Final images and results are pending  M.D. review.   Amy Mercy Riding, P.A. [facility]                     Vida Roller, M.D.   AB/MEDQ  D:  11/20/2002  T:  11/20/2002  Job:  [id number]   Review of Systems       negative other than history of present illness. She does suffer from chronic headaches. There's no fever chills or sweats.  Vital Signs:  Patient profile:   65 year old female Height:      66 inches Weight:      240 pounds BMI:     38.88 O2 Sat:      97 % on Room air Pulse rate:   83 / minute BP sitting:   152 / 101  (left arm)  Vitals Entered By: Fuller Plan CMA (September 12, 2010 10:30 AM)  O2 Flow:  Room air   Physical Exam  General:  pleasant, obese, walks with a cane, in no acute distress Head:  normocephalic and atraumatic Eyes:  PERRLA/EOM intact; conjunctiva and lids normal. Neck:  Neck supple, no JVD. No masses, thyromegaly or abnormal cervical nodes. Chest Wall:  no deformities or breast masses noted Lungs:  Clear bilaterally to  auscultation and percussion. Heart:  PMI nondisplaced, normal S1-S2, no murmur or gallop. No carotid bruits Msk:  decreased ROM.   Pulses:  pulses normal in all 4 extremities Extremities:  No clubbing or cyanosis. Neurologic:  Alert and oriented x 3. Skin:  Intact without lesions or rashes. Psych:  Normal affect.   Impression & Recommendations:  Problem # 1:  INTERMITTENT VERTIGO (ICD-780.4) Assessment New She describes intermittent vertigo of short duration. She has fallen once with this. She is clearly not orthostatic and this does not sound cardiogenic. She has a lot of congestion and I've advised her to followup with primary care for a routine regimen to clear this up in hopes that will help her vertigo. I have not given meclizine since this is inconsistent and the underlying etiology needs to be addressed first. No further cardiac workup indicated. We'll see her back p.r.n.  Problem # 2:  PALPITATIONS (ICD-785.1) Assessment: Improved  The following medications were removed from the medication list:    Aspirin 81 Mg Tbec (Aspirin) ..... Once daily Her updated medication list for this problem includes:    Lisinopril-hydrochlorothiazide 20-12.5 Mg Tabs (Lisinopril-hydrochlorothiazide) .Marland Kitchen... Take 2 tablet by mouth daily    Metoprolol Tartrate 25 Mg Tabs (Metoprolol tartrate) ..... One two times a day  Patient Instructions: 1)  Your physician recommends that you schedule a follow-up appointment in: as needed  2)  Your physician recommends that you continue on your current medications as directed. Please refer to the Current Medication list given to you today.

## 2010-10-23 NOTE — Letter (Signed)
Summary: DR [facility] RECORDS  DR [facility] RECORDS   Imported By: Faythe Ghee 09/16/2010 09:39:36  _____________________________________________________________________  External Attachment:    Type:   Image     Comment:   External Document

## 2010-12-02 LAB — MULTIPLE SCLEROSIS PANEL 2
Albumin CSF: 23 mg/dL (ref <35)
Albumin Index: 5.7 (ref <9.0)
Albumin: 4040 mg/dL (ref 3700–5410)
CNS-IgG Synthesis Rate: <0.1 mg/24hr (ref <3.3)
IgA CSF: 1.08 mg/dL — ABNORMAL HIGH (ref 0.15–0.60)
IgA MSPROF: <0.001 mg/dL (ref <0.010)
IgA Total: 565 mg/dL — ABNORMAL HIGH (ref 81–463)
IgG Total CSF: 2.9 mg/dL (ref 0.5–6.1)
IgG Total: 945 mg/dL (ref 694–1618)
IgG-Index: 0.54 (ref <0.70)
IgG: <0.01 mg/dL (ref <0.10)
IgM MSPROF: <0.001 mg/dL (ref <0.010)
IgM Total: 59 mg/dL (ref 48–271)
IgM-CSF: 0.07 mg/dL (ref <0.10)
Myelin basic protein, csf: <2 mcg/L (ref <4.1)

## 2010-12-02 LAB — CSF CELL COUNT WITH DIFFERENTIAL
RBC Count, CSF: 0 /mm3
Tube #: 3
WBC, CSF: 2 /mm3 (ref 0–5)

## 2010-12-02 LAB — HEMOGLOBIN AND HEMATOCRIT, BLOOD
HCT: 41 % (ref 36.0–46.0)
Hemoglobin: 13.4 g/dL (ref 12.0–15.0)

## 2010-12-02 LAB — PROTEIN AND GLUCOSE, CSF
Glucose, CSF: 58 mg/dL (ref 43–76)
Total  Protein, CSF: 42 mg/dL (ref 15–45)

## 2010-12-02 LAB — BASIC METABOLIC PANEL
BUN: 14 mg/dL (ref 6–23)
CO2: 27 mEq/L (ref 19–32)
Calcium: 8.6 mg/dL (ref 8.4–10.5)
Chloride: 96 mEq/L (ref 96–112)
Creatinine, Ser: 0.89 mg/dL (ref 0.4–1.2)
GFR calc Af Amer: >60 mL/min (ref >60)
GFR calc non Af Amer: >60 mL/min (ref >60)
Glucose, Bld: 84 mg/dL (ref 70–99)
Potassium: 4.1 mEq/L (ref 3.5–5.1)
Sodium: 132 mEq/L — ABNORMAL LOW (ref 135–145)

## 2010-12-02 LAB — ANGIOTENSIN CONVERTING ENZYME: Angiotensin-Converting Enzyme: 8 U/L (ref 8–52)

## 2010-12-30 LAB — CBC
HCT: 41 % (ref 36.0–46.0)
Hemoglobin: 14 g/dL (ref 12.0–15.0)
MCHC: 34.1 g/dL (ref 30.0–36.0)
MCV: 92.9 fL (ref 78.0–100.0)
Platelets: 236 10*3/uL (ref 150–400)
RBC: 4.42 MIL/uL (ref 3.87–5.11)
RDW: 13.8 % (ref 11.5–15.5)
WBC: 6.3 10*3/uL (ref 4.0–10.5)

## 2010-12-30 LAB — DIFFERENTIAL
Basophils Absolute: 0 10*3/uL (ref 0.0–0.1)
Basophils Relative: 0 % (ref 0–1)
Eosinophils Absolute: 0.2 10*3/uL (ref 0.0–0.7)
Eosinophils Relative: 3 % (ref 0–5)
Lymphocytes Relative: 30 % (ref 12–46)
Lymphs Abs: 1.9 10*3/uL (ref 0.7–4.0)
Monocytes Absolute: 0.6 10*3/uL (ref 0.1–1.0)
Monocytes Relative: 10 % (ref 3–12)
Neutro Abs: 3.6 10*3/uL (ref 1.7–7.7)
Neutrophils Relative %: 58 % (ref 43–77)

## 2010-12-30 LAB — SAMPLE TO BLOOD BANK

## 2010-12-30 LAB — BASIC METABOLIC PANEL
BUN: 9 mg/dL (ref 6–23)
CO2: 29 mEq/L (ref 19–32)
Calcium: 9.3 mg/dL (ref 8.4–10.5)
Chloride: 102 mEq/L (ref 96–112)
Creatinine, Ser: 0.85 mg/dL (ref 0.4–1.2)
GFR calc Af Amer: >60 mL/min (ref >60)
GFR calc non Af Amer: >60 mL/min (ref >60)
Glucose, Bld: 93 mg/dL (ref 70–99)
Potassium: 3.8 mEq/L (ref 3.5–5.1)
Sodium: 138 mEq/L (ref 135–145)

## 2011-02-03 NOTE — Procedures (Signed)
NAMELAUREN, Sara [name]             ACCOUNT NO.:  [id number]   MEDICAL RECORD NO.:  [id number]          PATIENT TYPE:  OUT   LOCATION:  SLEEP LAB                     FACILITY:  [facility]   PHYSICIAN:  Barbaraann Share, MD,FCCPDATE OF BIRTH:  03-04-47   DATE OF STUDY:                            NOCTURNAL POLYSOMNOGRAM   REFERRING PHYSICIAN:  Franchot Heidelberg, M.D.   DATE OF SURGERY:  August 01, 2007   INDICATION FOR STUDY:  Hypersomnia with sleep apnea.   EPWORTH SLEEPINESS SCORE:  15.   SLEEP ARCHITECTURE:  The patient had total sleep time of 318 minutes  with very little slow wave sleep and decreased REM.  Sleep onset latency  was normal as was REM onset.  Sleep efficiency was decreased at 80%.   RESPIRATORY DATA:  The patient was found to have 3 obstructive hypopneas  and no apneas for an apnea/hypopnea index of 0.6 events per hour.  Events were not positional, but they did occur exclusively during REM.  Moderate snoring was noted throughout.  The patient did not meet split-  night protocol secondary to small numbers of events.   OXYGEN DATA:  There was O2 desaturation transiently as low as 88% during  the study.   CARDIAC DATA:  No clinically significant arrhythmias were noted.   MOVEMENT-PARASOMNIA:  None.   IMPRESSIONS-RECOMMENDATIONS:  1. Small numbers of obstructive events which do not meet the      apnea/hypopnea index criteria for the obstructive sleep apnea      syndrome.  The patient was noted to have moderate snoring.  2. No clinically significant cardiac arrhythmias or movements of sleep      were noted.  3. If the patient has significant daytime sleepiness that remains      unexplained, they may benefit from consultation with a sleep      specialist.      Barbaraann Share, MD,FCCP  Diplomate, American Board of Sleep  Medicine  Electronically Signed     [facility]/MEDQ  D:  08/09/2007 08:37:01  T:  08/09/2007 14:07:37  Job:  [id number]

## 2011-02-03 NOTE — Op Note (Signed)
NAME:  Sara [name], Sara [name]             ACCOUNT NO.:  [id number]   MEDICAL RECORD NO.:  [id number]          PATIENT TYPE:  AMB   LOCATION:  DAY                           FACILITY:  [facility]   PHYSICIAN:  R. Roetta Sessions, M.D. DATE OF BIRTH:  12-11-1945   DATE OF PROCEDURE:  06/07/2007  DATE OF DISCHARGE:                               OPERATIVE REPORT   PROCEDURE:  Screening colonoscopy.   INDICATIONS FOR PROCEDURE:  A 65 year old age lady referred at the  courtesy of Dr. Renne Crigler for colorectal cancer screening.  She is devoid of  any lower GI tract symptoms.  She denies a family history of colon  cancer or prior history of polyps. She tells me her last colonoscopy has  been some 7 or so years ago without significant findings. Colonoscopy is  now being done as a screening maneuver, this approach has been discussed  with the patient at length.  The potential risks, benefits and  alternatives have been reviewed, questions answered.  She is agreeable.  Please see the documentation in the medical record.   MONITORING:  O2 saturation, blood pressure and pulse were monitored  throughout the entire procedure.   CONSCIOUS SEDATION:  Versed 4 mg IV, Demerol 75 mg IV in divided doses.   INSTRUMENTS:  Pentax video chip system.   FINDINGS:  Digital rectal exam revealed no abnormalities.   ENDOSCOPIC FINDINGS:  The prep was adequate.   COLON:  The colonic mucosa was surveyed from the rectosigmoid junction  to the low transverse and right colon to the area of the appendical  orifice, ileocecal valve and cecum.  These structures were well seen and  photographed for the record. From this level, the scope was slowly  withdrawn and all previously mentioned mucosa surfaces were again seen.  The patient had extensive left-sided diverticula. The  colonic mucosa  appeared normal.  The colon was elongated, tortuous, and capacious. It  required a number of maneuvers including changing of the patient's  physician and external abdominal pressure required to reach the cecum.  The remainder of the colonic mucosa however appeared normal.  The scope  was pulled down in the rectum where a thorough examination of the rectal  mucosa including a retroflexed view of the anal verge demonstrated no  abnormalities.  The patient tolerated the procedure well and was reacted  in endoscopy.   IMPRESSION:  1. Normal rectum.  2. Left-sided diverticula, long tortuous redundant colon but otherwise      normal colonic mucosa.   RECOMMENDATIONS:  Diverticulosis literature provided to [name]   Unless new information emerges or the patient's family history changes  or she develops future symptoms, I would recommend she does not need a  another screening colonoscopy any sooner than 10 years from now.      Jonathon Bellows, M.D.  Electronically Signed     RMR/MEDQ  D:  06/07/2007  T:  06/07/2007  Job:  [id number]   cc:   Franchot [place], M.D.

## 2011-02-03 NOTE — H&P (Signed)
Sara [name], Sara [name]             ACCOUNT NO.:  [id number]   MEDICAL RECORD NO.:  [id number]          PATIENT TYPE:  AMB   LOCATION:  DAY                           FACILITY:  [facility]   PHYSICIAN:  Vickki Hearing, M.D.DATE OF BIRTH:  May 24, 1946   DATE OF ADMISSION:  DATE OF DISCHARGE:  LH                              HISTORY & PHYSICAL   CHIEF COMPLAINT:  Right knee pain.   HISTORY:  This is a 65 year old female with right knee pain which has  failed conservative treatment, which included cortisone injection, anti-  inflammatory ibuprofen, Relafen, Dosepak, Neurontin.  She complains of  giving-way and lateral pain, inability to walk for exercise.  She has  had symptoms now for several months.  We also treated her for bursitis.  She had an MRI and bone scan.  The MRI shows a degenerative tear of the  lateral meniscus with a parameniscal cyst, three-compartment OA.   I have spoken with her and her husband and have alerted them to the fact  that she will have continued pain in this knee due to arthritis.  She  should expect a recovery period of 4-6 weeks.   MEDICAL SYSTEM REVIEW:  The patient reports weight gain, poor  circulation, ulcers, GERD, nausea, numbness, weakness, joint pain, joint  swelling, thyroid disease, poor vision, sinusitis, seasonal allergies.  She denies shortness of breath, hematuria, depression, eczema, lymph  node disease.   She reports DARVON gives her hallucinations.  No no other allergies.   She has hypertension, Grave's disease.  She has had a tonsillectomy,  lumpectomy, hysterectomy, exploratory laparotomy.  She is followed by  Dr. Erby Pian.   MEDICATIONS:  Lisinopril, hydrochlorothiazide, levothyroxine,  nabumetone, Xyzal 5 mg.   FAMILY HISTORY:  Heart disease.   She is married, does not smoke or drink.  She has a BS in business  administration.   Her weight is 200.  Her pulse is 86, her respiratory rate is 18.  Her appearance was normal.  PERIPHERAL VASCULAR SYSTEM:  No swelling.  Temperature normal.  No  edema.  Good pulses.  LYMPH NODES:  Negative in the C-spine.  MUSCULOSKELETAL:  Gait and station:  No significant limp.  She has  lateral knee pain.  Positive McMurray test.  Full range of motion.  Crepitance on flexion/extension of the knee.  The knee is stable.  Muscle strength and tone are normal.  SKIN:  Normal.  Reflexes are normal.  Sensation is normal.  She is oriented x3.  Mood  and affect are normal.   IMPRESSION:  Torn lateral meniscus, osteoarthritis, right knee.   PLAN:  Arthroscopy, right knee; partial lateral meniscectomy.  Surgery  scheduled for Wednesday July 30, in the afternoon with a follow-up  Thursday.      Vickki Hearing, M.D.  Electronically Signed     SEH/MEDQ  D:  04/18/2007  T:  04/19/2007  Job:  [id number]   cc:   [facility]  Fax: [phone]

## 2011-02-03 NOTE — Op Note (Signed)
NAMECYMONE, YESKE             ACCOUNT NO.:  [id number]   MEDICAL RECORD NO.:  [id number]          PATIENT TYPE:  AMB   LOCATION:  DAY                           FACILITY:  [facility]   PHYSICIAN:  Vickki Hearing, M.D.DATE OF BIRTH:  1946-02-03   DATE OF PROCEDURE:  04/20/2007  DATE OF DISCHARGE:                               OPERATIVE REPORT   PREOPERATIVE DIAGNOSIS:  Torn lateral meniscus, osteoarthritis, right  knee.   POSTOPERATIVE DIAGNOSIS:  Torn lateral meniscus, osteoarthritis, right  knee.   PROCEDURE:  Surgical arthroscopy right knee, partial lateral  meniscectomy.   SURGEON:  Vickki Hearing, M.D., no assistants.   ANESTHETIC:  General.   OPERATIVE FINDINGS:  There was a tear of the midportion of the lateral  meniscus with degenerative changes throughout the knee, worse in the  trochlear and patellar regions there was mild grade 2 changes medially,  grade 3 changes in the patella, grade 3 changes on the trochlea, grade 1  to 2 changes on the lateral tibial plateau and lateral femoral condyle.   The patient has history as follows:  A 65 year old female right knee  pain, failed conservative treatment including cortisone injection, anti-  inflammatories, ibuprofen, Relafen.  We used a Dosepak.  We used some  Neurontin.  Primary symptoms were giving way with lateral pain and  inability to walk for exercise.  She had an MRI and a bone scan. MRI  showed degenerative tear lateral meniscus with parameniscal cyst and  three compartment OSTEOARTHRITIS.   The procedure was done as follows:  Ms. Temkin was identified in the  preop holding area.  Her right knee was marked for surgery.  I then  countersigned that updated her history and physical.  She was taken to  surgery for general anesthesia and antibiotics were started.  Her right  leg was prepped with DuraPrep.  Draped sterilely and the time-out  procedure was then completed.   The lateral portal was established,  a diagnostic arthroscopy was done  and then repeated with the assistance of a probe through the medial  portal.   Using a straight shaver and  a 90 degree ArthroCare wand, a partial  lateral meniscectomy was performed.  Chondroplasties performed with  motorized shaver and ArthroCare wand on the medial femoral condyle and  patella.  These were just light debridements.  No extensive removal of  cartilage was done.   Knee was irrigated, suctioned dry, injected with Marcaine with  epinephrine, dressed sterilely, cryo cuff over Ace bandage applied.  The  patient extubated, taken to recovery room in stable condition.  Discharged on Vicodin and Phenergan to follow-up tomorrow.  She has a  cryo cuff to use.  She is weightbearing as tolerated.      Vickki Hearing, M.D.  Electronically Signed     SEH/MEDQ  D:  04/20/2007  T:  04/21/2007  Job:  [id number]

## 2011-02-06 NOTE — Procedures (Signed)
   NAME:  Sara [name], Sara [name]                       ACCOUNT NO.:  [id number]   MEDICAL RECORD NO.:  [id number]                   PATIENT TYPE:  INP   LOCATION:  A203                                 FACILITY:  [facility]   PHYSICIAN:  Vida Roller, M.D.                DATE OF BIRTH:  12-06-1945   DATE OF PROCEDURE:  11/20/2002  DATE OF DISCHARGE:                                  ECHOCARDIOGRAM   REFERRING PHYSICIAN:  Elliot Cousin, M.D.   PROCEDURE:  Echocardiogram.  Tape #[id number], tape count [id number] to [id number].   REASON FOR PROCEDURE:  Chest pain with hypothyroidism.  Quality of the study  was adequate.   M-MODE MEASUREMENTS:  1. The aorta is 32 mm.  2. The left atrium is 26 mm.  3. The septum is 11 mm.  4. The posterior wall is 10 mm.  5. LV diastolic dimension is 38 mm.  6. LV systolic dimension is 30 mm.   2-D AND DOPPLER IMAGING:  1. The left ventricle is normal size with normal systolic function.     Diastolic function was not assessed.  There are no wall motion     abnormalities.  2. The right ventricle is normal size with normal systolic function.  3. Both atria are normal size but subcostal views are not adequate to assess     for atrioseptal defect.  4. The aortic valve is trileaflet/tricommissural with no evidence of     stenosis or regurgitation.  5. The mitral valve is morphologically unremarkable with trace mitral     regurgitation.  No stenosis is seen.  6. The tricuspid valve shows mild tricuspid regurgitation.  No stenosis is     seen.  7. The pulmonic valve was not well seen.  8. The pericardial structures are not well seen.  9. The ascending aorta is not well seen.                                               Vida Roller, M.D.    JH/MEDQ  D:  11/20/2002  T:  11/20/2002  Job:  [id number]

## 2011-02-06 NOTE — H&P (Signed)
NAME:  Sara [name], Sara [name]                       ACCOUNT NO.:  [id number]   MEDICAL RECORD NO.:  [id number]                   PATIENT TYPE:  INP   LOCATION:  A203                                 FACILITY:  [facility]   PHYSICIAN:  Elliot Cousin, M.D.                 DATE OF BIRTH:  05/03/1946   DATE OF ADMISSION:  11/17/2002  DATE OF DISCHARGE:                                HISTORY & PHYSICAL   CHIEF COMPLAINT:  Chest tightness and pressure.   HISTORY OF PRESENT ILLNESS:  The patient is a 65 year old African-American  woman with a past medical history significant for hypothyroidism,  hypertension, and chest pain who presented to the emergency department this  morning with two episodes of chest tightness and chest pressure.  The  patient states that she swept snow off of her car this morning before going  to work.  Afterwards, she felt tired and unusually fatigues.  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  The pain went away spontaneously.  Several minutes  later, she had another episode of chest pain for approximately 2 to 3  minutes, but the second time it radiated to the left jaw.  The pain returned  on the way to the hospital.  The patient had associated nausea,  lightheadedness, shortness of breath, and a few palpitations.  She denied  any associated sweating or syncope.   REVIEW OF SYSTEMS:  Positive for a wet cough, generalized weakness, just  not feeling well, nausea, chills, headache, left shoulder pain (secondary  to a fall three weeks ago), and chronic bilateral knee and left elbow pain.  The patient's Review of Systems is negative for fever, sore throat, earache,  vomiting, diarrhea, blood in the stools, black tarry stools, and dysuria.  (Review of Systems is positive for chronic stress and urge urinary  incontinence.)   It was noted in the patient's office chart that her TSH is 127.  The patient  states that  she was notified of this; however, she did not have an  appointment with her endocrinologist until March.  She continues to take  Synthroid at 100 mcg each day.  When the patient was evaluated in the  emergency department today, her EKG was significant for bradycardia with a  heart rate of 58.  Her labs were significant for a CK of 572 but a negative  CK-MB of 2.9 and a normal relative index of 0.5 and slightly elevated  troponin I of 0.05.   Given the patient's history, she will be admitted for evaluation of chest  pain.  It is also important to note that the patient was admitted to the  hospital in 1999 with chest pain and in 2000 with chest pain.  She had a  Cardiolite stress test during each admission, and they were both negative.  She also  ruled out for a myocardial infarction with negative cardiac enzymes  as well.   PAST MEDICAL HISTORY:  1. Chest pain in 1999.     a. Cardiolite stress test 01/18/1998 revealed a negative Cardiolite study.     b. Cardiolite stress test performed April 2000 revealed a normal left        ventricular ejection fraction of 55% with no focal wall motion        abnormality, negative evidence of exercise-induced myocardial        ischemia; however, at a submaximal level of exercise.  2. Hypertension.  3. Hypothyroidism.  Graves disease diagnosed February 1999 status post     radioactive iodine therapy.  4. Status post tonsillectomy in 1987.  5. Status post total abdominal hysterectomy in 1975 secondary to fibroids.  6. Status post left lumpectomy in 1980 (benign tumor).  7. Status post exploratory laparotomy in 1980s (results unknown).  8. Stress/urge urinary incontinence.  9. Cervical degenerative joint disease.  MRI of the cervical spine without     contrast in April 2000 revealed a tiny central disk herniation without     neural compression at C2 to C3, small to moderate disk herniation on the     right causing a minimal indentation on the spinal  cord without nerve root     compression at C3 to C4, mild stenosis and spur formation at C4 to C5,     and mild diffuse disk bulging and spur formation more pronounced on the     left at C5 to C6, mild disk bulging with moderate bilateral neural     foraminal stenosis at C6 to C7, small disk herniation on the right     without neural compression at T1 to T2.  Cerebellar tonsils are slightly     low lying without beaking.  In intrinsic cord abnormalities are seen.  10.      Multiple medium diverticulum in the descending colon per     colonoscopy in August 2002.   MEDICATIONS:  1. Synthroid 100 mcg daily.  2. HCTZ 25 mg 1/2 tablet daily.  3. Inderal LA 60 mg daily.  4. Aciphex 20 mg daily.  5. Atacand 16 mg daily.  6. Detrol LA 4 mg daily.   ALLERGIES:  The patient has allergies to DARVON and DARVOCET.   SOCIAL HISTORY:  The patient is divorced, and she lives in [place], [place].  She is a Education officer, community.  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  She stopped  smoking in 1987.  She denies alcohol and drug use.   FAMILY HISTORY:  Her mother is deceased at the age of 58 secondary to  complications of a pulmonary embolus (status post right shoulder  arthroscopic surgery).  Her father is still living.  He has a history of  hypertension and heart disease.   PHYSICAL EXAMINATION:  VITAL SIGNS:  Taken in the emergency department  revealed pulse 67, respiratory rate 18, blood pressure 111/83, oxygen  saturation 100%.  Temperature 95.7.  HEENT:  Head is normocephalic and atraumatic.  Pupils are equal, round, and  reactive to light.  Extraocular movements are intact.  The patient may have  subtle lid lag.  The patient also has some mild puffiness around both eyes.  Conjunctivae are clear.  Sclerae are white.  External ear canals are clear.  Nasal mucosa is moist without any drainage.  No sinus tenderness. Oropharynx revealed good dentition.  Mucous membranes are moist.  No  posterior  exudate  or erythema.  NECK:  Supple without thyromegaly or adenopathy.  The patient does have some  mild to moderate left cervical spine tenderness.  LUNGS:  Clear to auscultation bilaterally.  HEART:  S1, S2 with bradycardia and a very soft systolic murmur.  BREASTS:  Deferred.  ABDOMEN:  There is a well-healed hypogastric abdominal scar.  Bowel sounds  are present.  Her abdomen is soft, nontender, nondistended.  The liver edge  is palpable.  No hepatosplenomegaly.  EXTREMITIES/MUSCULOSKELETAL:  The patient has good range of motion of her  arms and both shoulders as well as her legs with flexion at the knee joints  bilaterally.  She does have some mild to moderate tenderness over the left  trapezius muscle and slightly over the left rotator cuff.  She has trace  nonpitting edema of both lower extremities with some mild soft tissue  swelling in the pretibial area.  She has no pedal edema.  Her pedal pulses  are 2+.  She has no tenderness over her left chest wall.  NEUROLOGIC:  Cranial nerves II-XII intact.  The patient is alert and  oriented x 3.  Her strength is 5/5 throughout.  Sensation is intact  throughout.   ADMISSION LABORATORY DATA:  Chest x-ray:  Borderline cardiomegaly, no acute  disease.   EKG:  Normal sinus rhythm with heart rate of 55, left axis deviation.   CK 572, CK-MB 2.9, relative index 0.5, troponin I 0.05.  PTT 32, PT 12.9,  INR 0.9.  Sodium 134, potassium 3.9, chloride 95, CO2 32, glucose 89, BUN  12, creatinine 1.1, total bilirubin 0.7, alkaline phosphatase 64, SGOT 37,  SGPT 36, total protein 7.8, albumin 4.0, calcium 8.9. WBC 6.5, hemoglobin  13.4, hematocrit 39.9, platelet count 224.   ASSESSMENT:  1. Chest pain.  The patient does have risk factors which include surgical     menopause, family history, and hypertension.  The patient had been     admitted twice before in the past secondary for chest pain but was ruled     out both times for myocardial ischemia with  negative cardiac enzymes and     negative Cardiolite stress test.  The patient has a significantly high     thyroid-stimulating hormone (TSH)  per lab work obtained in the office on     October 06, 2002, but apparently has not had any medication changes     and/or followup with her endocrinologist.  The patient also has a history     of a significant cervical degenerative joint changes with disk     herniations and disk bulging throughout her cervical spine.  Give these     findings, the chest pain could be secondary to cervical     spondylosis/cervical degenerative joint disease and/or a myocarditis from     profound hypothyroidism.  It is important for the patient to be admitted     to rule out myocardial ischemia.  2. Hyperthyroidism.  The patient apparently was well controlled on Synthroid     back in October 2003 when her TSH was 1.18, but as stated above, the TSH    which was obtained in January 2004 revealed a profoundly high level at     127.  the patient's positive Review of Systems as well as the chest pain     could be attributed to this finding.  The patient also had some mild     puffiness around her eyes and some  mild nonpitting edema of her pretibial     areas.  She also has bradycardia which could also be attributed to the     hyperthyroidism.  3. Hypertension:  The patient's blood pressure appears to be well controlled     on Atacand, hydrochlorothiazide, and Inderal.   PLAN:  1. The patient was admitted to a telemetry bed.  2. Treatment had started in the emergency department when the patient was     given sublingual nitroglycerin which relieved her pain.  3. Will continue treatment with sublingual nitroglycerin 0.4 mg as needed     for pain.  The patient is chest pain free now.  4. Will obtain cardiac enzymes q.8h. x 3.  5. Will obtain cardiology consult.  6. Will obtain TSH and a free T4.  7. Will check a fasting lipid panel and EKG in the a.m.  8. Will add Xanax  as needed daily.  9. Continue aspirin at 81 mg each day.  10.      Add oxygen if the patient has recurrent chest pain.  11.      Add Tylenol and Motrin as needed for neck and shoulder pain.  12.      Will obtain radiograph of the left shoulder.  Will also consider     obtaining MRI of the cervical spine for additional followup.  13.      Will obtain endocrinology consult with Dr. Dario Guardian.                                              Elliot Cousin, M.D.   DF/MEDQ  D:  11/17/2002  T:  11/17/2002  Job:  [id number]

## 2011-02-06 NOTE — Discharge Summary (Signed)
NAME:  Sara [name], Sara [name]                       ACCOUNT NO.:  [id number]   MEDICAL RECORD NO.:  [id number]                   PATIENT TYPE:  INP   LOCATION:  A310                                 FACILITY:  [facility]   PHYSICIAN:  Elliot Cousin, M.D.                 DATE OF BIRTH:  1946-05-17   DATE OF ADMISSION:  11/17/2002  DATE OF DISCHARGE:  11/21/2002                                 DISCHARGE SUMMARY   DISCHARGE DIAGNOSES:  1. Chest pain secondary to noncardiac etiology.     a. Adenosine Cardiolite study on 11/20/2002 was completely within normal        limits.  No ischemic changes noted.     b. A 2-D echocardiogram revealed normal left ventricular function, normal        right ventricle size, and normal right ventricular systolic function,        the remainder of the echocardiogram within normal limits.  2. Cervical disk disease.     a. MRI of the cervical spine on 11/20/2002 revealed multilevel,        degenerative, cervical spondylotic changes; progression of        degenerative changes since prior exam April 2000; foraminal stenosis        on the left at C4-C5 and on the left at C5-C6 and to a lesser degree        at C6-C7.  Moderate sized central and slight right paracentral disk        herniation at C3-C4; posterior disk protrusion, left, at C4-C5 with        foraminal narrowing a day encroachment on the left C5 nerve root; left        C6 nerve root is encroached upon; left encroachment on left C7 nerve        root.  3. Hypothyroidism.  4. Hyperlipidemia.  5. Hypertension.  6. Urinary tract infection secondary to Escherichia coli.  7. Mild anxiety.   SECONDARY DISCHARGE DIAGNOSES:  1. Status post tonsillectomy in 1987.  2. Status post total abdominal hysterectomy in 1975 secondary to fibroids.  3. Status post left lumpectomy in 1980 (benign tumor).  4. Status post exploratory laparotomy in 1980s (results unknown).  5. Stress/urge urinary incontinence.  6. Multiple medium  diverticulum in the descending colon per colonoscopy in     August of 2002.   DISCHARGE MEDICATIONS:  1. Synthroid 150 mcg daily.  2. HCTZ 25 mg 1/2 pill daily.  3. Inderal LA 60 mg daily (hold).  4. Aciphex 20 mg daily.  5. Atacand 16 mg 1 p.o. daily (hold).  6. Detrol LA 4 mg daily.  7. Zocor 20 mg q.h.s.  8. Potassium chloride 20 mg daily.  9. Xanax 0.25 mg 1/2 pill or 1 pill b.i.d. p.r.n. stress.   DISPOSITION:  This patient was discharged to home on 11/21/2002 in improved  and stable condition. She has a follow up  appointment with her primary care  physician, Dr. Loleta Chance, on 11/28/2002.   CONSULTATIONS:  1. Dr. Dorethea Clan.  2. Dr. Dario Guardian.   PROCEDURES PERFORMED:  1. A 2-D echocardiogram.  The results revealed a normal left and right     ventricular size and function.  No valvular abnormalities.  2. Adenosine Cardiolite.  The results were negative for ischemia.  3. MRI of the cervical spine.  The results as above.   HISTORY OF PRESENT ILLNESS:  This patient is a 65 year old African-American  woman with a past medical history significant for hypothyroidism,  hypertension, and chest pain.  She presented to the emergency department on  the morning of 11/17/2002 with chest tightness and chest pressure.  The  patient stated that she swept snow off of her car, that morning, before  going to work.  Afterwards she felt tired and unusually fatigued.  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 The pain went away spontaneously.  Several minutes  later she had another episode of chest pain for approximately 2-3 minutes,  but the second time it radiated to the jaw.   The pain returned on the way to the hospital.  The patient has associated  nausea, lightheadedness, shortness of breath, and a few palpitations.  She  denies any associated sweating or syncope.  The patient was, therefore,  admitted for evaluation and treatment  of chest pain.   HOSPITAL COURSE:  #1:  CHEST PAIN.  The initial management included  admitting the patient to a telemetry bed, daily aspirin therapy, obtaining  cardiac enzymes q.8h. x3, obtaining a cardiology consult, and treating the  patient's chest pain with sublingual nitroglycerin as needed.  The patient,  on initial exam was oxygenating 100% on room air, therefore, oxygen was not  administered.  The patient was also prescribed Tylenol and Motrin for pain  as the patient rated her pain as a 4/10.  The patient's cholesterol panel  and thyroid panel were also assessed.  The patient's first set of cardiac  enzymes revealed a CK of 572, a CK/MB of 2.9, and a troponin I which was  mildly elevated at 0.05.  The following cardiac enzymes revealed a CK of  407, CK/MB of 2.1, and a troponin I of less than 0.01.  The third set of  cardiac enzymes revealed a CK of 394, CK/MB of 1.6, and a troponin I of  0.02.  The patient's EKG revealed a normal sinus rhythm with a rate  initially of 58 which increased to a rate of 64 after Inderal was stopped.   The patient had minimal pain during the hospital course. She required 1  sublingual nitroglycerin which did ease her pain.  The pain was also  relieved with Motrin and Tylenol.  Dr. Dorethea Clan, cardiologist, was consulted  for further evaluation and management.  He recommended stopping the Inderal  prior to the stress test which he ordered.  A 2-D echocardiogram was also  ordered as well.  The Adenosine Cardiolite stress test was negative for  ischemia; and the 2-D echocardiogram was well within normal limits with  normal left ventricular and right ventricular function.   Prior to hospital discharge the patient was relatively pain free with  regards to chest pain.  The patient also mentioned that she had been under a  lot of stress lately.  Therefore, she was treated with p.r.n. Xanax which relieved her symptoms.  The  etiology of her chest pain is  unknown, but it  was unlikely cardiac in origin.  The patient had significant cervical disk  disease on the MRI and this could be a possible explanation for her chest  pain.  The patient's thyroid function test and lipid panel will be discussed  below.   #2:  CERVICAL DISK DISEASE.  The patient, per review of old records, had  significant cervical disk disease.  Therefore, in the workup of chest pain,  an MRI of the cervical spine was ordered for further evaluation.  The  patient had some right shoulder pain, and only mild neck pain during the  hospitalization.  A radiograph of the right shoulder was ordered and it was  completely negative.  The patient denied any weakness, or numbness or  tingling in her left arm; however, with the chest pain it did radiate to her  right arm.  The MRI results were not available at the time that the patient  was discharged.  They became available at the time of dictation.   The MRI of the cervical spine revealed progression of the degenerative  changes that were present during the April 2000 MRI.  More specifically, she  does have disk herniation at C3 and C4 and a lateral disk herniation on the  left at C4-C5 and encroachment on the left C5 nerve root and encroachment on  the left C6 nerve root.  These findings could explain the patient's chest  pain and right shoulder pain.  The patient will have referral to a neural  surgeon in 1 week at the hospital follow up with Dr. Loleta Chance.  As stated above,  the patient's pain was treated with Motrin and Tylenol.  She was pain free  prior to hospital discharge.  She demonstrated no neurological deficits on  exam.   #3:  HYPOTHYROIDISM.  The patient's home dose of Synthroid was 100 mg each  day.  A thyroid panel was assessed during hospitalization.  The TSH was  found to be elevated at 91.59 and the free T4 was reduced at 0.74.  Dr.  Dario Guardian, the patient's endocrinologist, was consulted.  He recommended  increasing  the dose to 150 mcg each day. She will continue to have close  follow up by Dr. Dario Guardian.  She will need to have her TSH and free T4  reassessed in 3-4 weeks.   #4:  HYPERLIPIDEMIA.  The patient's fasting lipid panel was assessed during  the hospitalization.  She was found to have an elevated total cholesterol of  258, HDL cholesterol of 52, LDL cholesterol elevated at 188, and  triglycerides within normal limits of 90. The patient was started on Zocor  20 mg each night.  This regimen will continue in the outpatient setting.  She will need to have a repeat of her lipid panel in 3-6 months.   #5:  HYPERTENSION.  The patient's Atacand and Inderal LA were held during  the hospital course. The patient continued to be treated with  hydrochlorothiazide 25 mg 1/2 pill each day, as well as potassium chloride  20 mEq each day for a mildly reduced potassium level.  The patient's blood pressures were very well controlled during the hospitalization.  In fact,  they bordered on a low normal range.  The Inderal LA was initially  discontinued secondary to the Cardiolite stress test; and the Atacand was  not restarted at all.  Given that the patient's blood pressures were well  within normal limits  on the hydrochlorothiazide alone, the patient was  advised to hold and not take the Inderal LA and the Atacand until she was  reevaluated in the office in 1 week by Dr. Loleta Chance.   #6:  URINARY TRACT INFECTION SECONDARY TO E. COLI.  The patient on hospital  day #2 complained of a malodorous urine and some crampy, suprapubic  discomfort.  A urinalysis was, therefore, ordered, and found to be positive  for urine nitrites and positive for many bacteria, as well as, positive for  wbc's.  Urine culture was obtained and was found to be positive for E. coli  greater than 100,000 colonies.  The patient was started on Cipro 500 mg  b.i.d. x3 days. She completed an antibiotic course during the  hospitalization.  The  malodorous urine and the suprapubic pain resolved  completely before discharge.                                               Elliot Cousin, M.D.    DF/MEDQ  D:  11/21/2002  T:  11/21/2002  Job:  [id number]

## 2011-02-06 NOTE — Consult Note (Signed)
NAME:  Sara [name], Sara [name]                       ACCOUNT NO.:  [id number]   MEDICAL RECORD NO.:  [id number]                   PATIENT TYPE:  INP   LOCATION:  A203                                 FACILITY:  [facility]   PHYSICIAN:  Vida Roller, M.D.                DATE OF BIRTH:  26-Jan-1946   DATE OF CONSULTATION:  11/17/2002  DATE OF DISCHARGE:                                   CONSULTATION   PRIMARY COMPLAINT:  Chest discomfort.   HISTORY OF PRESENT ILLNESS:  The patient is a 65 year old African-American  woman who presented to the emergency department complaining of chest  discomfort.  She describes a fullness that starts in her chest, radiates up  into her shoulder, and then across into her neck.  She describes it as  similar to a labor pain with a squeezing, tightening situation in her chest  that then escalates to a peak and then crescendos back down.  It usually  lasts not more than a minute, associated with left jaw pain as well.  No  nausea or vomiting.  No diaphoresis.  There is no exertional component to  the pain.  There are no exacerbating or relieve symptoms.  She has no  palpitations.  No diaphoresis.  She states that it started to occur about  two weeks ago.  Was associated with some shortness of breath and a little  bit of dyspnea on exertion.  She reports increasing fatigue and weakness as  well as cold intolerance, heat intolerance, weight gain, and increased edema  in her legs.   PAST MEDICAL HISTORY:  1. Hypertension.  2. Hypothyroidism secondary to Graves' disease and thyroid ablation with I-     131.  3. Gastroesophageal reflux disease.  4. Multiple evaluations in the past for atypical chest discomfort.  Most     recently, in 1999 she had an exercise Cardiolite with no ischemia and a     normal left ventricular ejection fraction.   MEDICATIONS ON ADMISSION:  Synthroid, hydrochlorothiazide, triamterene, and  Inderal LIGHT AND ACCOMMODATION.   SOCIAL HISTORY:   She lives in [place] by herself.  She is divorced.  She  has five grandchildren.  She works at a bank.  She has a 30-plus-pack-year  smoking history, quit in 1987.  Does not use alcohol.  Does not exercise.  Has a relatively poor diet.   FAMILY HISTORY:  Mother died at age 37 from complications of surgery.  Father is still alive at age 67, with coronary artery disease.   REVIEW OF SYSTEMS:  Her review of systems is generally positive but most  significantly for the weight gain and those previously described in the HPI.   PHYSICAL EXAMINATION:  GENERAL:  Well-developed, well-nourished, very  articulate African-American woman in no acute distress.  Alert and oriented  x 4.  VITAL SIGNS:  Her temperature is 96.7, pulse is 56 and regular, respiratory  rate  is 18, and she is afebrile.  Blood pressure is 103/70.  HEENT:  Unremarkable.  NECK:  Supple.  With no jugular venous distention or carotid bruits.  Thyroid is nontender and in the midline and small.  There is no  lymphadenopathy.  CHEST:  Clear to auscultation.  CARDIAC:  Reveals a nondisplaced point of maximal impulse with no lifts or  thrills.  First and second heart sounds are normal.  There is no third heart  sound, no fourth heart sound, and no murmurs.  ABDOMEN:  Soft and nontender.  Normoactive bowel sounds.  Mildly obese.  GENITOURINARY:  Deferred.  RECTAL:  Deferred.  EXTREMITIES:  Shows 1+ edema to the ankles with 2+ pulses.  No bruits are  noted.  MUSCULOSKELETAL:  Normal.  NEUROLOGIC:  Nonfocal.   LABORATORY DATA:  Her chest x-ray shows borderline cardiomegaly, otherwise  no acute changes.   Electrocardiogram reveals sinus rhythm with a rate of 58 with a mild left  axis deviation at -36, with poor R-wave progression and no ST and T-wave  changes, consistent with a myocardial infarction.   White blood cell count 6.5, H&H are 13 and 39, platelet count 224,000.  Sodium of 134, potassium of 3.9, chloride of 95,  bicarbonate of 32, BUN of  12, creatinine of 1.1, and her glucose is 89.  Her liver function studies  are all normal.  Total protein is slightly increased at 7.8 with an albumin  of 4.  Her first set of cardiac enzymes was not consistent with an acute  myocardial infarction.  Calcium normal at 8.9.  PT, PTT, and INR are all  within normal limits.   ASSESSMENT:  This is a lady with chest discomfort which is very atypical for  coronary disease, but she does have multiple risk factors.  The other issue  is her increased TSH.  She has mild cardiomegaly on chest x-ray.  This is  concerning for a hypothyroid cardiomyopathy, and consideration should be  made for an echocardiogram as well as exercise Cardiolite.  If the exercise  Cardiolite shows normal left ventricular size with normal wall motion and no  depressed ejection fraction, then echocardiogram may not be needed.                                               Vida Roller, M.D.    JH/MEDQ  D:  11/17/2002  T:  11/17/2002  Job:  [id number]

## 2011-02-06 NOTE — Procedures (Signed)
   NAME:  Sara [name], Sara [name]                       ACCOUNT NO.:  [id number]   MEDICAL RECORD NO.:  [id number]                   PATIENT TYPE:  INP   LOCATION:  A203                                 FACILITY:  [facility]   PHYSICIAN:  Vida Roller, M.D.                DATE OF BIRTH:  01-Mar-1946   DATE OF PROCEDURE:  DATE OF DISCHARGE:                                    STRESS TEST   INDICATIONS:  The patient is a 65 year old female with no known coronary  artery disease who presents with atypical chest discomfort.   BASELINE DATA:  EKG:  Sinus rhythm at 58 beats per minute with nonspecific  ST changes.  Blood pressure 128/78.   49 mg of Adenosine Cardiolite was infused over a four minute protocol.  Cardiolite was injected at three minutes.  The patient reported symptoms of  chest tightness, flushing, and headache.  All of these symptoms resolved in  the recovery period.  EKG showed several blocked beats; however, no ischemic  changes were noted.  The blocked beats were associated with the infusion and  were not seen in the recovery period.  Final images and results are pending  M.D. review.     Amy Mercy Riding, P.A. [facility]                     Vida Roller, M.D.    AB/MEDQ  D:  11/20/2002  T:  11/20/2002  Job:  [id number]

## 2011-02-09 ENCOUNTER — Other Ambulatory Visit (HOSPITAL_COMMUNITY): Payer: Self-pay | Admitting: Family Medicine

## 2011-02-09 ENCOUNTER — Ambulatory Visit (HOSPITAL_COMMUNITY)
Admission: RE | Admit: 2011-02-09 | Discharge: 2011-02-09 | Disposition: A | Discharge status: Home / Self Care | Payer: Medicare Other | Source: Ambulatory Visit | Attending: Family Medicine | Admitting: Family Medicine

## 2011-02-09 DIAGNOSIS — M25511 Pain in right shoulder: Secondary | ICD-10-CM

## 2011-02-09 DIAGNOSIS — M542 Cervicalgia: Secondary | ICD-10-CM | POA: Insufficient documentation

## 2011-02-09 DIAGNOSIS — M5030 Other cervical disc degeneration, unspecified cervical region: Secondary | ICD-10-CM | POA: Insufficient documentation

## 2011-02-09 DIAGNOSIS — M25519 Pain in unspecified shoulder: Secondary | ICD-10-CM | POA: Insufficient documentation

## 2011-04-15 ENCOUNTER — Encounter: Payer: Self-pay | Admitting: Orthopedic Surgery

## 2011-04-15 ENCOUNTER — Ambulatory Visit (INDEPENDENT_AMBULATORY_CARE_PROVIDER_SITE_OTHER): Payer: Medicare Other | Admitting: Orthopedic Surgery

## 2011-04-15 DIAGNOSIS — IMO0002 Reserved for concepts with insufficient information to code with codable children: Secondary | ICD-10-CM

## 2011-04-15 DIAGNOSIS — M1710 Unilateral primary osteoarthritis, unspecified knee: Secondary | ICD-10-CM

## 2011-04-15 MED ORDER — METHYLPREDNISOLONE ACETATE 40 MG/ML IJ SUSP: 40.0000 mg | Freq: Once | INTRAMUSCULAR | Status: DC

## 2011-04-15 NOTE — Patient Instructions (Signed)
The patient is advised to apply ice or cold packs intermittently as needed to relieve pain.

## 2011-04-15 NOTE — Progress Notes (Signed)
Chief complaint pain, RIGHT knee.  History of RIGHT knee arthroscopy.  RIGHT knee arthroscopy and chondroplasty, medial femoral condyle was done along with lateral partial meniscectomy within the last 3 years. Comes in today complaining of increasing pain in the RIGHT knee. Pain started over a month ago. Her pain was associated with swelling, no trauma, but increased activity for exercise. The patient has lost significant amount of weight and would like to continue exercising.  No catching, locking, or giving way, but crepitance noted. Pain, mild to moderate.  Review of systems musculoskeletal no muscle pain, warmth, redness, or stiffness, just some joint swelling, which has resolved. No skin changes, and normal neurologic findings.  Exam general appearance is normal, patient oriented x3, with normal mood.  Ambulate without assistive device in no limp.  RIGHT knee. Inspection reveals medial and lateral joint line tenderness. Synovium seems to be normal. Range of motion 120 no flexion contracture. Knee is stable anteriorly, posteriorly, and medial lateral. Strength and muscle tone are normal. McMurray sign is negative. Skin is intact pulse and temperature are normal. Sensation is normal. Pathologic reflexes are negative and coordination and balance are normal.  X-rays were obtained, which showed symmetric joint space narrowing, peaking of the tibial spines consistent with degenerative arthritis. Patella is centered with some mild degenerative changes.  Impression osteoarthritis, RIGHT knee with acute exacerbation and effusion, which has subsequently resolved.  Recommend cortisone injection, patient education, continue exercise to tolerance. X-ray once a year or as symptoms demand.  Verbal consent was obtained   Time out completed   Under sterile conditions the right knee was injected with Depomedrol 40 mg / ml (1 ml) and lidocaine 1% (4 ml)  There were no complications    X-ray report  separate and identifiable. RIGHT knee.  3 views.  Reason for x-ray pain, RIGHT knee with swelling.  Findings: The overall alignment still is in valgus, 3 4. The joint spaces are modestly compromised, medial and lateral with symmetric joint space narrowing, the tibial spines. Repeat. The patellofemoral joint distended with mild degenerative changes.  Impression mild-to-moderate degenerative arthritis, RIGHT knee.

## 2011-05-13 LAB — BASIC METABOLIC PANEL
ALT: 24 U/L (ref 7–35)
AST: 31 U/L
Albumin: 4.3
Alkaline Phosphatase: 82 U/L
Calcium: 9.4 mg/dL
Chloride: 101 mmol/L
Globulin: 3.1
Potassium: 3.9 mmol/L
Sodium: 140 mmol/L (ref 137–147)
Total Bilirubin: 0.5 mg/dL
Total Protein ELP: 7.4

## 2011-05-18 ENCOUNTER — Other Ambulatory Visit: Payer: Self-pay | Admitting: Family Medicine

## 2011-05-18 DIAGNOSIS — M4800 Spinal stenosis, site unspecified: Secondary | ICD-10-CM

## 2011-05-19 ENCOUNTER — Ambulatory Visit
Admission: RE | Admit: 2011-05-19 | Discharge: 2011-05-19 | Disposition: A | Discharge status: Home / Self Care | Payer: Medicare Other | Source: Ambulatory Visit | Attending: Family Medicine | Admitting: Family Medicine

## 2011-05-19 DIAGNOSIS — M4800 Spinal stenosis, site unspecified: Secondary | ICD-10-CM

## 2011-05-19 MED ORDER — IOHEXOL 180 MG/ML  SOLN
1.0000 mL | Freq: Once | INTRAMUSCULAR | Status: AC | PRN
  Administered 2011-05-19: 1 mL via EPIDURAL

## 2011-05-19 MED ORDER — METHYLPREDNISOLONE ACETATE 40 MG/ML INJ SUSP (RADIOLOG
120.0000 mg | Freq: Once | INTRAMUSCULAR | Status: AC
  Administered 2011-05-19: 120 mg via EPIDURAL

## 2011-06-01 ENCOUNTER — Other Ambulatory Visit (HOSPITAL_COMMUNITY): Payer: Self-pay | Admitting: Family Medicine

## 2011-06-01 DIAGNOSIS — Z139 Encounter for screening, unspecified: Secondary | ICD-10-CM

## 2011-06-23 ENCOUNTER — Other Ambulatory Visit: Payer: Self-pay | Admitting: Family Medicine

## 2011-06-23 DIAGNOSIS — M549 Dorsalgia, unspecified: Secondary | ICD-10-CM

## 2011-06-24 ENCOUNTER — Ambulatory Visit
Admission: RE | Admit: 2011-06-24 | Discharge: 2011-06-24 | Disposition: A | Discharge status: Home / Self Care | Payer: Medicare Other | Source: Ambulatory Visit | Attending: Family Medicine | Admitting: Family Medicine

## 2011-06-24 DIAGNOSIS — M549 Dorsalgia, unspecified: Secondary | ICD-10-CM

## 2011-06-24 MED ORDER — IOHEXOL 180 MG/ML  SOLN
1.0000 mL | Freq: Once | INTRAMUSCULAR | Status: AC | PRN
  Administered 2011-06-24: 1 mL via EPIDURAL

## 2011-06-24 MED ORDER — METHYLPREDNISOLONE ACETATE 40 MG/ML INJ SUSP (RADIOLOG
120.0000 mg | Freq: Once | INTRAMUSCULAR | Status: AC
  Administered 2011-06-24: 120 mg via EPIDURAL

## 2011-07-06 LAB — BASIC METABOLIC PANEL
BUN: 11
CO2: 27
Calcium: 8.7
Chloride: 104
Creatinine, Ser: 0.84
GFR calc Af Amer: >60
GFR calc non Af Amer: >60
Glucose, Bld: 96
Potassium: 3.4 — ABNORMAL LOW
Sodium: 135

## 2011-07-06 LAB — HEMOGLOBIN AND HEMATOCRIT, BLOOD
HCT: 40.7
Hemoglobin: 13.7

## 2011-07-09 ENCOUNTER — Ambulatory Visit (HOSPITAL_COMMUNITY)
Admission: RE | Admit: 2011-07-09 | Discharge: 2011-07-09 | Disposition: A | Discharge status: Home / Self Care | Payer: Medicare Other | Source: Ambulatory Visit | Attending: Family Medicine | Admitting: Family Medicine

## 2011-07-09 DIAGNOSIS — Z139 Encounter for screening, unspecified: Secondary | ICD-10-CM

## 2011-07-09 DIAGNOSIS — Z1231 Encounter for screening mammogram for malignant neoplasm of breast: Secondary | ICD-10-CM | POA: Insufficient documentation

## 2011-07-10 ENCOUNTER — Encounter: Payer: Self-pay | Admitting: Cardiology

## 2011-07-28 ENCOUNTER — Other Ambulatory Visit: Payer: Self-pay | Admitting: Family Medicine

## 2011-07-28 DIAGNOSIS — M47817 Spondylosis without myelopathy or radiculopathy, lumbosacral region: Secondary | ICD-10-CM

## 2011-07-29 ENCOUNTER — Ambulatory Visit
Admission: RE | Admit: 2011-07-29 | Discharge: 2011-07-29 | Disposition: A | Discharge status: Home / Self Care | Payer: Medicare Other | Source: Ambulatory Visit | Attending: Family Medicine | Admitting: Family Medicine

## 2011-07-29 DIAGNOSIS — M47817 Spondylosis without myelopathy or radiculopathy, lumbosacral region: Secondary | ICD-10-CM

## 2011-07-29 MED ORDER — METHYLPREDNISOLONE ACETATE 40 MG/ML INJ SUSP (RADIOLOG
120.0000 mg | Freq: Once | INTRAMUSCULAR | Status: AC
  Administered 2011-07-29: 120 mg via EPIDURAL

## 2011-07-29 MED ORDER — IOHEXOL 180 MG/ML  SOLN
1.0000 mL | Freq: Once | INTRAMUSCULAR | Status: AC | PRN
  Administered 2011-07-29: 1 mL via EPIDURAL

## 2011-08-19 LAB — BASIC METABOLIC PANEL
ALT: 29 U/L (ref 7–35)
AST: 30 U/L
Albumin: 4.4
Alkaline Phosphatase: 98 U/L
Calcium: 9.4 mg/dL
Chloride: 101 mmol/L
Globulin: 3.4
Potassium: 4.2 mmol/L
Sodium: 142 mmol/L (ref 137–147)
Total Protein ELP: 7.8
Total bilirubin, fluid: 0.6

## 2011-09-22 HISTORY — PX: ESOPHAGOGASTRODUODENOSCOPY: SHX1529

## 2011-11-11 ENCOUNTER — Ambulatory Visit: Payer: Medicare Other | Attending: Neurology | Admitting: Sleep Medicine

## 2011-11-11 DIAGNOSIS — G4700 Insomnia, unspecified: Secondary | ICD-10-CM

## 2011-11-11 DIAGNOSIS — G4733 Obstructive sleep apnea (adult) (pediatric): Secondary | ICD-10-CM | POA: Insufficient documentation

## 2011-11-11 DIAGNOSIS — Z6839 Body mass index (BMI) 39.0-39.9, adult: Secondary | ICD-10-CM | POA: Insufficient documentation

## 2011-11-13 ENCOUNTER — Other Ambulatory Visit: Payer: Self-pay | Admitting: Family Medicine

## 2011-11-14 NOTE — Procedures (Signed)
[name], Sara [name]             ACCOUNT NO.:  [id number]  MEDICAL RECORD NO.:  [id number]          PATIENT TYPE:  OUT  LOCATION:  SLEEP LAB                     FACILITY:  [facility]  PHYSICIAN:  Holten Spano A. Gerilyn Pilgrim, M.D. DATE OF BIRTH:  23-Dec-1945  DATE OF STUDY:  11/11/2011                           NOCTURNAL POLYSOMNOGRAM  REFERRING PHYSICIAN:  Demani Weyrauch A. Gerilyn Pilgrim, MD  INDICATION FOR STUDY:  This is a 66 year old who presents with fatigue, drowsiness, and snoring.  This study has been done to evaluate for obstructive sleep apnea syndrome.  EPWORTH SLEEPINESS SCORE: 1. BMI 39.  MEDICATIONS:  Levothyroxine, lisinopril, hydrochlorothiazide, metoprolol, Flexeril, naproxen, gabapentin, pravastatin, omeprazole, Flonase.  SLEEP ARCHITECTURE:  Architectural summary:  The total recording time is 394 minutes.  Sleep efficiency 86%, sleep latency 48 minutes, REM latency 82 minutes.  Stage N1 4.1%, N2 65%, N3 0%, and REM sleep 31%.  RESPIRATORY DATA:  Respiratory summary:  Baseline oxygen saturation is 96, lowest saturation 81.  Diagnostic AHI is 74 with most of the events occurring during REM sleep.  The REM AHI is 24.  CARDIAC DATA:  Electrocardiogram summary:  Average heart rate is 81 with no significant dysrhythmias observed.  MOVEMENT-PARASOMNIA:  Limb movement summary:  PLM index is 0.  IMPRESSIONS-RECOMMENDATIONS:  Mild obstructive sleep apnea syndrome, mostly REM related, not requiring positive pressure treatment.     [name] A. Gerilyn Pilgrim, M.D.   KAD/MEDQ  D:  11/14/2011 13:59:52  T:  11/14/2011 14:55:37  Job:  [id number]

## 2011-11-18 ENCOUNTER — Other Ambulatory Visit: Payer: Self-pay | Admitting: Family Medicine

## 2011-11-18 DIAGNOSIS — M545 Low back pain, unspecified: Secondary | ICD-10-CM

## 2011-11-19 ENCOUNTER — Ambulatory Visit
Admission: RE | Admit: 2011-11-19 | Discharge: 2011-11-19 | Disposition: A | Discharge status: Home / Self Care | Payer: Medicare Other | Source: Ambulatory Visit | Attending: Family Medicine | Admitting: Family Medicine

## 2011-11-19 DIAGNOSIS — M545 Low back pain, unspecified: Secondary | ICD-10-CM

## 2011-11-26 ENCOUNTER — Ambulatory Visit: Payer: Medicare Other | Admitting: Gastroenterology

## 2011-12-01 ENCOUNTER — Ambulatory Visit (INDEPENDENT_AMBULATORY_CARE_PROVIDER_SITE_OTHER): Payer: Medicare Other | Admitting: Gastroenterology

## 2011-12-01 ENCOUNTER — Encounter: Payer: Self-pay | Admitting: Gastroenterology

## 2011-12-01 DIAGNOSIS — K7689 Other specified diseases of liver: Secondary | ICD-10-CM

## 2011-12-01 DIAGNOSIS — K760 Fatty (change of) liver, not elsewhere classified: Secondary | ICD-10-CM

## 2011-12-01 DIAGNOSIS — K219 Gastro-esophageal reflux disease without esophagitis: Secondary | ICD-10-CM | POA: Insufficient documentation

## 2011-12-01 DIAGNOSIS — R1011 Right upper quadrant pain: Secondary | ICD-10-CM

## 2011-12-01 DIAGNOSIS — K921 Melena: Secondary | ICD-10-CM | POA: Insufficient documentation

## 2011-12-01 DIAGNOSIS — R1310 Dysphagia, unspecified: Secondary | ICD-10-CM | POA: Insufficient documentation

## 2011-12-01 NOTE — Progress Notes (Signed)
Faxed to PCP

## 2011-12-01 NOTE — Patient Instructions (Signed)
You will be undergoing an upper endoscopy with Dr. Jena Gauss in the near future.   In the meantime, continue taking Prilosec daily. Start taking this in the morning, 30 minutes before the first meal of the day.   Please review the reflux diet.

## 2011-12-01 NOTE — Progress Notes (Signed)
Primary Care Physician:  Sara Obey, MD, MD Primary Gastroenterologist:  Dr. Jena Gauss  Chief Complaint  Patient presents with  . Dysphagia    HPI:   Sara [name] presents today at the request of Dr. Sudie Bailey secondary to dysphagia. She reports solid food dysphagia and pill dysphagia. Symptoms starting around Jan 2013, thought may be secondary to sinus issues. Notes chronic hx of GERD. Takes Omeprazole 40 mg daily. Does note intermittent breakthrough reflux. Taking Prilosec before last meal of evening. No vomiting. Rare intermittent nausea. Reports RUQ discomfort, intermittent, unsure if related to eating/drinking. Denies radiating features, yet she does state she notes the same pain in her back, right-sided. Duration hour to 2 hours. Described as burning, described as 4-6/10. Gallbladder remains in situ.   Taking Naprosyn BID, chronic due to DDD. No aspirin powders. No melena. 2010 seen in [facility] ED with rectal bleeding, notes confirm "scabbed" area at rectum, hemoccult negative at that time. None since. No change in bowel habits, does note intermittent constipation.   Pt reports hx of duodenal ulcer, stating she was given antibiotic therapy in remote past. EGD done by Dr. Katrinka Blazing in remote past. ?H.pylori. Requesting reports.   Past Medical History  Diagnosis Date  . HTN (hypertension)   . Thyroid disease   . Chronic back pain   . Reflux   . Cervical spondylosis without myelopathy   . Degeneration of cervical intervertebral disc   . Unspecified arthropathy, shoulder region   . Pure hypercholesterolemia   . Allergic rhinitis due to pollen   . Cervicalgia   . Diverticulosis of colon (without mention of hemorrhage)   . Morbid obesity   . Other postablative hypothyroidism   . Palpitations   . Peptic ulcer, unspecified site, unspecified as acute or chronic, without mention of hemorrhage, perforation, or obstruction   . Peripheral vertigo, unspecified   . Reflux esophagitis   . Spinal  stenosis in cervical region   . 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 Personal history of tobacco use, presenting hazards to health   . Insomnia, unspecified   . Lumbago   . Primary localized osteoarthrosis, lower leg   . Hypertension     Past Surgical History  Procedure Date  . Knee surgery   . Vaginal hysterectomy   . Breast lumpectomy   . Tonsillectomy   . Appendectomy   . Colonoscopy Sept 2008    RMR: normal rectum, left-sided diverticula, repeat in 2018    Current Outpatient Prescriptions  Medication Sig Dispense Refill  . aspirin 81 MG tablet Take 81 mg by mouth daily.      . cyclobenzaprine (FLEXERIL) 10 MG tablet Take 10 mg by mouth 3 (three) times daily as needed.        . flunisolide (NASAREL) 29 MCG/ACT (0.025%) nasal spray Place 2 sprays into the nose 2 (two) times daily. Dose is for each nostril.      Marland Kitchen gabapentin (NEURONTIN) 600 MG tablet Take 600 mg by mouth 3 (three) times daily.        Marland Kitchen HYDROcodone-acetaminophen (VICODIN) 5-500 MG per tablet Take 1 tablet by mouth every 6 (six) hours as needed.        Marland Kitchen levothyroxine (SYNTHROID, LEVOTHROID) 88 MCG tablet Take 88 mcg by mouth daily.      Marland Kitchen lisinopril-hydrochlorothiazide (PRINZIDE,ZESTORETIC) 20-12.5 MG per tablet Take 1 tablet by mouth daily.        Marland Kitchen LORazepam (  ATIVAN) 1 MG tablet Take 1 mg by mouth at bedtime as needed.      . metoprolol tartrate (LOPRESSOR) 25 MG tablet Take 25 mg by mouth 2 (two) times daily.        . naproxen (NAPROSYN) 500 MG tablet Take 500 mg by mouth 2 (two) times daily with a meal.        . omeprazole (PRILOSEC) 40 MG capsule Take 40 mg by mouth daily.      . pravastatin (PRAVACHOL) 40 MG tablet Take 40 mg by mouth daily.       Current Facility-Administered Medications  Medication Dose Route Frequency Provider Last Rate Last Dose  . methylPREDNISolone acetate (DEPO-MEDROL) injection 40 mg  40 mg Intra-articular Once  Vickki Hearing, MD        Allergies as of 12/01/2011 - Review Complete 12/01/2011  Allergen Reaction Noted  . Darvon Other (See Comments) 04/15/2011  . Propoxyphene hcl Other (See Comments) 09/07/2006  . Latex Dermatitis and Rash 06/24/2011    Family History  Problem Relation Age of Onset  . Colon cancer Neg Hx     History   Social History  . Marital Status: Married    Spouse Name: N/A    Number of Children: N/A  . Years of Education: N/A   Occupational History  . disability     since 2008, knee surgery   Social History Main Topics  . Smoking status: Former Smoker -- 1.0 packs/day    Types: Cigarettes  . Smokeless tobacco: Former Neurosurgeon    Quit date: 10/02/1985  . Alcohol Use: Yes     occasional wine  . Drug Use: No  . Sexually Active: Not on file   Other Topics Concern  . Not on file   Social History Narrative  . No narrative on file    Review of Systems: Gen: Denies any fever, chills, fatigue, weight loss, lack of appetite.  CV: Denies chest pain, heart palpitations, peripheral edema, syncope.  Resp: Denies shortness of breath at rest or with exertion. Denies wheezing or cough.  GI: Denies dysphagia or odynophagia. Denies jaundice, hematemesis, fecal incontinence. GU : Denies urinary burning, urinary frequency, urinary hesitancy MS: Denies joint pain, muscle weakness, cramps, or limitation of movement.  Derm: Denies rash, itching, dry skin Psych: Denies depression, anxiety, memory loss, and confusion Heme: Denies bruising, bleeding, and enlarged lymph nodes.  Physical Exam: BP 125/79  Pulse 87  Temp(Src) 98.4 F (36.9 C) (Temporal)  Ht 5\' 6"  (1.676 m)  Wt 248 lb 9.6 oz (112.764 kg)  BMI 40.13 kg/m2 General:   Alert and oriented. Pleasant and cooperative. Well-nourished and well-developed.  Head:  Normocephalic and atraumatic. Eyes:  Without icterus, sclera clear and conjunctiva pink.  Ears:  Normal auditory acuity. Nose:  No deformity, discharge,   or lesions. Mouth:  No deformity or lesions, oral mucosa pink.  Neck:  Supple, without mass or thyromegaly. Lungs:  Clear to auscultation bilaterally. No wheezes, rales, or rhonchi. No distress.  Heart:  S1, S2 present without murmurs appreciated.  Abdomen:  +BS, soft, non-tender and non-distended. No HSM noted. No guarding or rebound. No masses appreciated.  Rectal:  Deferred  Msk:  Symmetrical without gross deformities. Normal posture. Extremities:  Without clubbing or edema. Neurologic:  Alert and  oriented x4;  grossly normal neurologically. Skin:  Intact without significant lesions or rashes. Cervical Nodes:  No significant cervical adenopathy. Psych:  Alert and cooperative. Normal mood and affect.

## 2011-12-01 NOTE — Assessment & Plan Note (Signed)
One occurrence noted in 2010, prompting visit to ED. Notes describe a "scab" at rectum. No further incidences, no changes in bowel habits. Last TCS in Sept 2008 with normal rectum, left-sided diverticula. I do not foresee an indication for repeat colonoscopy unless she exhibits signs of further rectal bleeding, anemia, etc. Pt aware. Continue with plans for routine screening in 2018.

## 2011-12-01 NOTE — Assessment & Plan Note (Addendum)
Noted on [place] in April 2010. Need updated HFP. Obtaining labs from Dr. Sudie Bailey for review.   Addendum 3/13: Received labs from Nov 2012, CMP with normal LFTs.

## 2011-12-01 NOTE — Assessment & Plan Note (Signed)
See dysphagia. Likely multifactorial. EGD in future.

## 2011-12-01 NOTE — Assessment & Plan Note (Signed)
66 year old female with new-onset dysphagia starting in Jan, notable with solids and pills. Intermittent breakthrough reflux noted as well, on Prilosec daily in evenings. Does have possible hx of H.pylori per pt; states hx of ulcer in past and requiring abx therapy (Dr. Katrinka Blazing EGD remote past). She has also self-reported wt gain of around 20 lbs in last year. Rare nausea, no vomiting. No melena noted. Notes daily NSAIDs (Naprosyn BID).  Likely symptoms are multifactorial, with GERD secondary to wt gain, lifestyle factors. Could be contributing to dysphagia; however, unable to exclude esophageal web, ring, or stricture. Needs EGD with ED in near future for further evaluation.   Proceed with upper endoscopy, dilation in the near future with Dr. Jena Gauss. The risks, benefits, and alternatives have been discussed in detail with patient. They have stated understanding and desire to proceed.  Change Prilosec to each morning, 30 minutes before breakfast

## 2011-12-01 NOTE — Assessment & Plan Note (Addendum)
Noted as "burning", intermittent, unsure if related to eating/drinking. Interestingly, also notes same discomfort in back but does not radiate to back. Has hx of DDD, may be stemming from this. [place] of abd April 2010 fatty liver, no other abnormalities. Requesting labs from Dr. Sudie Bailey. Proceed with EGD, consider [place] of abdomen if necessary. Doubt biliary etiology at this time.   Obtain labs from PCP: need HFP if none available Addendum 3/13: CMP normal as of Nov 2012.

## 2011-12-02 ENCOUNTER — Encounter (HOSPITAL_COMMUNITY): Payer: Self-pay | Admitting: Pharmacy Technician

## 2011-12-07 MED ORDER — SODIUM CHLORIDE 0.45 % IV SOLN
Freq: Once | INTRAVENOUS | Status: AC
  Administered 2011-12-08: 10:00:00 via INTRAVENOUS

## 2011-12-08 ENCOUNTER — Ambulatory Visit (HOSPITAL_COMMUNITY)
Admission: RE | Admit: 2011-12-08 | Discharge: 2011-12-08 | Disposition: A | Discharge status: Home / Self Care | Payer: Medicare Other | Source: Ambulatory Visit | Attending: Internal Medicine | Admitting: Internal Medicine

## 2011-12-08 ENCOUNTER — Encounter (HOSPITAL_COMMUNITY): Payer: Self-pay | Admitting: *Deleted

## 2011-12-08 ENCOUNTER — Encounter (HOSPITAL_COMMUNITY)
Admission: RE | Disposition: A | Discharge status: Home / Self Care | Payer: Self-pay | Source: Ambulatory Visit | Attending: Internal Medicine

## 2011-12-08 DIAGNOSIS — I10 Essential (primary) hypertension: Secondary | ICD-10-CM | POA: Insufficient documentation

## 2011-12-08 DIAGNOSIS — Z7982 Long term (current) use of aspirin: Secondary | ICD-10-CM | POA: Insufficient documentation

## 2011-12-08 DIAGNOSIS — K222 Esophageal obstruction: Secondary | ICD-10-CM | POA: Insufficient documentation

## 2011-12-08 DIAGNOSIS — K269 Duodenal ulcer, unspecified as acute or chronic, without hemorrhage or perforation: Secondary | ICD-10-CM

## 2011-12-08 DIAGNOSIS — R1310 Dysphagia, unspecified: Secondary | ICD-10-CM

## 2011-12-08 DIAGNOSIS — K2980 Duodenitis without bleeding: Secondary | ICD-10-CM | POA: Insufficient documentation

## 2011-12-08 DIAGNOSIS — E785 Hyperlipidemia, unspecified: Secondary | ICD-10-CM | POA: Insufficient documentation

## 2011-12-08 DIAGNOSIS — Z79899 Other long term (current) drug therapy: Secondary | ICD-10-CM | POA: Insufficient documentation

## 2011-12-08 DIAGNOSIS — K219 Gastro-esophageal reflux disease without esophagitis: Secondary | ICD-10-CM

## 2011-12-08 DIAGNOSIS — K2960 Other gastritis without bleeding: Secondary | ICD-10-CM

## 2011-12-08 SURGERY — ESOPHAGOGASTRODUODENOSCOPY (EGD) WITH ESOPHAGEAL DILATION
Anesthesia: Moderate Sedation

## 2011-12-08 MED ORDER — MEPERIDINE HCL 100 MG/ML IJ SOLN
INTRAMUSCULAR | Status: AC
  Filled 2011-12-08: qty 2

## 2011-12-08 MED ORDER — SIMETHICONE 40 MG/0.6ML PO SUSP
ORAL | Status: DC | PRN
  Administered 2011-12-08: 11:00:00

## 2011-12-08 MED ORDER — BUTAMBEN-TETRACAINE-BENZOCAINE 2-2-14 % EX AERO
INHALATION_SPRAY | CUTANEOUS | Status: DC | PRN
  Administered 2011-12-08: 2 via TOPICAL

## 2011-12-08 MED ORDER — MIDAZOLAM HCL 5 MG/5ML IJ SOLN
INTRAMUSCULAR | Status: AC
  Filled 2011-12-08: qty 10

## 2011-12-08 MED ORDER — MIDAZOLAM HCL 5 MG/5ML IJ SOLN
INTRAMUSCULAR | Status: DC | PRN
  Administered 2011-12-08: 2 mg via INTRAVENOUS
  Administered 2011-12-08: 1 mg via INTRAVENOUS

## 2011-12-08 MED ORDER — MEPERIDINE HCL 100 MG/ML IJ SOLN
INTRAMUSCULAR | Status: DC | PRN
  Administered 2011-12-08: 25 mg via INTRAVENOUS
  Administered 2011-12-08: 50 mg via INTRAVENOUS

## 2011-12-08 NOTE — Op Note (Signed)
[facility] [address] [place], [place]  [address]  ENDOSCOPY PROCEDURE REPORT  PATIENT:  Sara [name], Sara [name]  MR#:  [id number] BIRTHDATE:  11-07-1945, 65 yrs. old  GENDER:  female  ENDOSCOPIST:  R. Roetta Sessions, MD Caleen Essex Referred by:  Gareth Morgan, M.D.  PROCEDURE DATE:  12/08/2011 PROCEDURE:  EGD with Elease Hashimoto dilation  INDICATIONS:   esophageal dysphagia; worsening reflux symptoms recently-improved with taking her PPI in the morning before her first meal rather than in the evening.  INFORMED CONSENT:   The risks, benefits, limitations, alternatives and imponderables have been discussed.  The potential for biopsy, esophogeal dilation, etc. have also been reviewed.  Questions have been answered.  All parties agreeable.  Please see the history and physical in the medical record for more information.  MEDICATIONS:     Versed 3 mg IV and Demerol 75 mg IV in divided doses. Cetacaine spray.  DESCRIPTION OF PROCEDURE:   The EG-[id number] ([id number]) endoscope was introduced through the mouth and advanced to the second portion of the duodenum without difficulty or limitations.  The mucosal surfaces were surveyed very carefully during advancement of the scope and upon withdrawal.  Retroflexion view of the proximal stomach and esophagogastric junction was performed.  <<PROCEDUREIMAGES>>  FINDINGS:  Possible subtle cervical esophageal web. Incomplete Schatzki's ramus; otherwise normal esophagus. Small hiatal hernia. Stomach empty. Prepyloric antral erosions. No ulcer or infiltrating process. Patent pylorus. Examination of both second and third portion revealed multiple 2-3 mm areas of ulceration.  THERAPEUTIC / DIAGNOSTIC MANEUVERS PERFORMED:   Biopsies of the second portion of the duodenum taken for histologic study. Subsequently, a 10 French Maloney dilator was passed to full insertion with moderate resistance. A look back revealed a probable esophageal web-dilated and  partial dilation of the Schatzki's ring. Biopsy forceps were used to disrupt the residual Schatzki's ring with two quadrant "bites".  COMPLICATIONS:   None  IMPRESSION:  Cervical esophageal web and Schatzki's ring - dilated and disrupted as described above. Small hiatal hernia. Antral erosions and                 duodenal ulcerations-likely NSAID effect. Status post biopsy of small bowel.  RECOMMENDATIONS:  Continue Prilosec 40 mg daily each morning. Follow up on pathology.  ______________________________ R. Roetta Sessions, MD Caleen Essex  CC:  n. eSIGNED:   R. Casimiro Needle Oanh Devivo at 12/08/2011 11:04 AM  Marylou Mccoy, [phone]

## 2011-12-08 NOTE — H&P (View-Only) (Signed)
Primary Care Physician:  KNOWLTON,STEPHEN D, MD, MD Primary Gastroenterologist:  Dr. Rourk  Chief Complaint  Patient presents with  . Dysphagia    HPI:   Ms. Hedtke presents today at the request of Dr. Knowlton secondary to dysphagia. She reports solid food dysphagia and pill dysphagia. Symptoms starting around Jan 2013, thought may be secondary to sinus issues. Notes chronic hx of GERD. Takes Omeprazole 40 mg daily. Does note intermittent breakthrough reflux. Taking Prilosec before last meal of evening. No vomiting. Rare intermittent nausea. Reports RUQ discomfort, intermittent, unsure if related to eating/drinking. Denies radiating features, yet she does state she notes the same pain in her back, right-sided. Duration hour to 2 hours. Described as burning, described as 4-6/10. Gallbladder remains in situ.   Taking Naprosyn BID, chronic due to DDD. No aspirin powders. No melena. 2010 seen in [facility] ED with rectal bleeding, notes confirm "scabbed" area at rectum, hemoccult negative at that time. None since. No change in bowel habits, does note intermittent constipation.   Pt reports hx of duodenal ulcer, stating she was given antibiotic therapy in remote past. EGD done by Dr. Smith in remote past. ?H.pylori. Requesting reports.   Past Medical History  Diagnosis Date  . HTN (hypertension)   . Thyroid disease   . Chronic back pain   . Reflux   . Cervical spondylosis without myelopathy   . Degeneration of cervical intervertebral disc   . Unspecified arthropathy, shoulder region   . Pure hypercholesterolemia   . Allergic rhinitis due to pollen   . Cervicalgia   . Diverticulosis of colon (without mention of hemorrhage)   . Morbid obesity   . Other postablative hypothyroidism   . Palpitations   . Peptic ulcer, unspecified site, unspecified as acute or chronic, without mention of hemorrhage, perforation, or obstruction   . Peripheral vertigo, unspecified   . Reflux esophagitis   . Spinal  stenosis in cervical region   . 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 Personal history of tobacco use, presenting hazards to health   . Insomnia, unspecified   . Lumbago   . Primary localized osteoarthrosis, lower leg   . Hypertension     Past Surgical History  Procedure Date  . Knee surgery   . Vaginal hysterectomy   . Breast lumpectomy   . Tonsillectomy   . Appendectomy   . Colonoscopy Sept 2008    RMR: normal rectum, left-sided diverticula, repeat in 2018    Current Outpatient Prescriptions  Medication Sig Dispense Refill  . aspirin 81 MG tablet Take 81 mg by mouth daily.      . cyclobenzaprine (FLEXERIL) 10 MG tablet Take 10 mg by mouth 3 (three) times daily as needed.        . flunisolide (NASAREL) 29 MCG/ACT (0.025%) nasal spray Place 2 sprays into the nose 2 (two) times daily. Dose is for each nostril.      . gabapentin (NEURONTIN) 600 MG tablet Take 600 mg by mouth 3 (three) times daily.        . HYDROcodone-acetaminophen (VICODIN) 5-500 MG per tablet Take 1 tablet by mouth every 6 (six) hours as needed.        . levothyroxine (SYNTHROID, LEVOTHROID) 88 MCG tablet Take 88 mcg by mouth daily.      . lisinopril-hydrochlorothiazide (PRINZIDE,ZESTORETIC) 20-12.5 MG per tablet Take 1 tablet by mouth daily.        . LORazepam (  ATIVAN) 1 MG tablet Take 1 mg by mouth at bedtime as needed.      . metoprolol tartrate (LOPRESSOR) 25 MG tablet Take 25 mg by mouth 2 (two) times daily.        . naproxen (NAPROSYN) 500 MG tablet Take 500 mg by mouth 2 (two) times daily with a meal.        . omeprazole (PRILOSEC) 40 MG capsule Take 40 mg by mouth daily.      . pravastatin (PRAVACHOL) 40 MG tablet Take 40 mg by mouth daily.       Current Facility-Administered Medications  Medication Dose Route Frequency Provider Last Rate Last Dose  . methylPREDNISolone acetate (DEPO-MEDROL) injection 40 mg  40 mg Intra-articular Once  Stanley E Harrison, MD        Allergies as of 12/01/2011 - Review Complete 12/01/2011  Allergen Reaction Noted  . Darvon Other (See Comments) 04/15/2011  . Propoxyphene hcl Other (See Comments) 09/07/2006  . Latex Dermatitis and Rash 06/24/2011    Family History  Problem Relation Age of Onset  . Colon cancer Neg Hx     History   Social History  . Marital Status: Married    Spouse Name: N/A    Number of Children: N/A  . Years of Education: N/A   Occupational History  . disability     since 2008, knee surgery   Social History Main Topics  . Smoking status: Former Smoker -- 1.0 packs/day    Types: Cigarettes  . Smokeless tobacco: Former User    Quit date: 10/02/1985  . Alcohol Use: Yes     occasional wine  . Drug Use: No  . Sexually Active: Not on file   Other Topics Concern  . Not on file   Social History Narrative  . No narrative on file    Review of Systems: Gen: Denies any fever, chills, fatigue, weight loss, lack of appetite.  CV: Denies chest pain, heart palpitations, peripheral edema, syncope.  Resp: Denies shortness of breath at rest or with exertion. Denies wheezing or cough.  GI: Denies dysphagia or odynophagia. Denies jaundice, hematemesis, fecal incontinence. GU : Denies urinary burning, urinary frequency, urinary hesitancy MS: Denies joint pain, muscle weakness, cramps, or limitation of movement.  Derm: Denies rash, itching, dry skin Psych: Denies depression, anxiety, memory loss, and confusion Heme: Denies bruising, bleeding, and enlarged lymph nodes.  Physical Exam: BP 125/79  Pulse 87  Temp(Src) 98.4 F (36.9 C) (Temporal)  Ht 5' 6" (1.676 m)  Wt 248 lb 9.6 oz (112.764 kg)  BMI 40.13 kg/m2 General:   Alert and oriented. Pleasant and cooperative. Well-nourished and well-developed.  Head:  Normocephalic and atraumatic. Eyes:  Without icterus, sclera clear and conjunctiva pink.  Ears:  Normal auditory acuity. Nose:  No deformity, discharge,   or lesions. Mouth:  No deformity or lesions, oral mucosa pink.  Neck:  Supple, without mass or thyromegaly. Lungs:  Clear to auscultation bilaterally. No wheezes, rales, or rhonchi. No distress.  Heart:  S1, S2 present without murmurs appreciated.  Abdomen:  +BS, soft, non-tender and non-distended. No HSM noted. No guarding or rebound. No masses appreciated.  Rectal:  Deferred  Msk:  Symmetrical without gross deformities. Normal posture. Extremities:  Without clubbing or edema. Neurologic:  Alert and  oriented x4;  grossly normal neurologically. Skin:  Intact without significant lesions or rashes. Cervical Nodes:  No significant cervical adenopathy. Psych:  Alert and cooperative. Normal mood and affect.    

## 2011-12-08 NOTE — Discharge Instructions (Addendum)
EGD Discharge instructions Please read the instructions outlined below and refer to this sheet in the next few weeks. These discharge instructions provide you with general information on caring for yourself after you leave the hospital. Your doctor may also give you specific instructions. While your treatment has been planned according to the most current medical practices available, unavoidable complications occasionally occur. If you have any problems or questions after discharge, please call your doctor. ACTIVITY  You may resume your regular activity but move at a slower pace for the next 24 hours.   Take frequent rest periods for the next 24 hours.   Walking will help expel (get rid of) the air and reduce the bloated feeling in your abdomen.   No driving for 24 hours (because of the anesthesia (medicine) used during the test).   You may shower.   Do not sign any important legal documents or operate any machinery for 24 hours (because of the anesthesia used during the test).  NUTRITION  Drink plenty of fluids.   You may resume your normal diet.   Begin with a light meal and progress to your normal diet.   Avoid alcoholic beverages for 24 hours or as instructed by your caregiver.  MEDICATIONS  You may resume your normal medications unless your caregiver tells you otherwise.  WHAT YOU CAN EXPECT TODAY  You may experience abdominal discomfort such as a feeling of fullness or "gas" pains.  FOLLOW-UP  Your doctor will discuss the results of your test with you.  SEEK IMMEDIATE MEDICAL ATTENTION IF ANY OF THE FOLLOWING OCCUR:  Excessive nausea (feeling sick to your stomach) and/or vomiting.   Severe abdominal pain and distention (swelling).   Trouble swallowing.   Temperature over 101 F (37.8 C).   Rectal bleeding or vomiting of blood.   GERD information provided.  Continue Prilosec each morning 30 minutes to an hour before breakfast  Further recommendations to follow  pending review of pathology report Hiatal Hernia A hiatal hernia occurs when a part of the stomach slides above the diaphragm. The diaphragm is the thin muscle separating the belly (abdomen) from the chest. A hiatal hernia can be something you are born with or develop over time. Hiatal hernias may allow stomach acid to flow back into your esophagus, the tube which carries food from your mouth to your stomach. If this acid causes problems it is called GERD (gastro-esophageal reflux disease).  SYMPTOMS  Common symptoms of GERD are heartburn (burning in your chest). This is worse when lying down or bending over. It may also cause belching and indigestion. Some of the things which make GERD worse are:  Increased weight pushes on stomach making acid rise more easily.   Smoking markedly increases acid production.   Alcohol decreases lower esophageal sphincter pressure (valve between stomach and esophagus), allowing acid from stomach into esophagus.   Late evening meals and going to bed with a full stomach increases pressure.   Anything that causes an increase in acid production.   Lower esophageal sphincter incompetence.  DIAGNOSIS  Hiatal hernia is often diagnosed with x-rays of your stomach and small bowel. This is called an UGI (upper gastrointestinal x-ray). Sometimes a gastroscopic procedure is done. This is a procedure where your caregiver uses a flexible instrument to look into the stomach and small bowel. HOME CARE INSTRUCTIONS   Try to achieve and maintain an ideal body weight.   Avoid drinking alcoholic beverages.   Stop smoking.   Put the head  of your bed on 4 to 6 inch blocks. This will keep your head and esophagus higher than your stomach. If you cannot use blocks, sleep with several pillows under your head and shoulders.   Over-the-counter medications will decrease acid production. Your caregiver can also prescribe medications for this. Take as directed.   1/2 to 1 teaspoon of  an antacid taken every hour while awake, with meals and at bedtime, will neutralize acid.   Do not take aspirin, ibuprofen (Advil or Motrin), or other nonsteroidal anti-inflammatory drugs.   Do not wear tight clothing around your chest or stomach.   Eat smaller meals and eat more frequently. This keeps your stomach from getting too full. Eat slowly.   Do not lie down for 2 or 3 hours after eating. Do not eat or drink anything 1 to 2 hours before going to bed.   Avoid caffeine beverages (colas, coffee, cocoa, tea), fatty foods, citrus fruits and all other foods and drinks that contain acid and that seem to increase the problems.   Avoid bending over, especially after eating. Also avoid straining during bowel movements or when urinating or lifting things. Anything that increases the pressure in your belly increases the amount of acid that may be pushed up into your esophagus.  SEEK IMMEDIATE MEDICAL CARE IF:  There is change in location (pain in arms, neck, jaw, teeth or back) of your pain, or the pain is getting worse.   You also experience nausea, vomiting, sweating (diaphoresis), or shortness of breath.   You develop continual vomiting, vomit blood or coffee ground material, have bright red blood in your stools, or have black tarry stools.  Some of these symptoms could signal other problems such as heart disease. MAKE SURE YOU:   Understand these instructions.   Monitor your condition.   Contact your caregiver if you are not doing well or are getting worse.  Document Released: 11/28/2003 Document Revised: 08/27/2011 Document Reviewed: 09/07/2005 [facility] Patient Information 2012 [place], [place].Gastroesophageal Reflux Disease, Adult Gastroesophageal reflux disease (GERD) happens when acid from your stomach flows up into the esophagus. When acid comes in contact with the esophagus, the acid causes soreness (inflammation) in the esophagus. Over time, GERD may create small holes  (ulcers) in the lining of the esophagus. CAUSES   Increased body weight. This puts pressure on the stomach, making acid rise from the stomach into the esophagus.   Smoking. This increases acid production in the stomach.   Drinking alcohol. This causes decreased pressure in the lower esophageal sphincter (valve or ring of muscle between the esophagus and stomach), allowing acid from the stomach into the esophagus.   Late evening meals and a full stomach. This increases pressure and acid production in the stomach.   A malformed lower esophageal sphincter.  Sometimes, no cause is found. SYMPTOMS   Burning pain in the lower part of the mid-chest behind the breastbone and in the mid-stomach area. This may occur twice a week or more often.   Trouble swallowing.   Sore throat.   Dry cough.   Asthma-like symptoms including chest tightness, shortness of breath, or wheezing.  DIAGNOSIS  Your caregiver may be able to diagnose GERD based on your symptoms. In some cases, X-rays and other tests may be done to check for complications or to check the condition of your stomach and esophagus. TREATMENT  Your caregiver may recommend over-the-counter or prescription medicines to help decrease acid production. Ask your caregiver before starting or adding any new  medicines.  HOME CARE INSTRUCTIONS   Change the factors that you can control. Ask your caregiver for guidance concerning weight loss, quitting smoking, and alcohol consumption.   Avoid foods and drinks that make your symptoms worse, such as:   Caffeine or alcoholic drinks.   Chocolate.   Peppermint or mint flavorings.   Garlic and onions.   Spicy foods.   Citrus fruits, such as oranges, lemons, or limes.   Tomato-based foods such as sauce, chili, salsa, and pizza.   Fried and fatty foods.   Avoid lying down for the 3 hours prior to your bedtime or prior to taking a nap.   Eat small, frequent meals instead of large meals.    Wear loose-fitting clothing. Do not wear anything tight around your waist that causes pressure on your stomach.   Raise the head of your bed 6 to 8 inches with wood blocks to help you sleep. Extra pillows will not help.   Only take over-the-counter or prescription medicines for pain, discomfort, or fever as directed by your caregiver.   Do not take aspirin, ibuprofen, or other nonsteroidal anti-inflammatory drugs (NSAIDs).  SEEK IMMEDIATE MEDICAL CARE IF:   You have pain in your arms, neck, jaw, teeth, or back.   Your pain increases or changes in intensity or duration.   You develop nausea, vomiting, or sweating (diaphoresis).   You develop shortness of breath, or you faint.   Your vomit is green, yellow, black, or looks like coffee grounds or blood.   Your stool is red, bloody, or black.  These symptoms could be signs of other problems, such as heart disease, gastric bleeding, or esophageal bleeding. MAKE SURE YOU:   Understand these instructions.   Will watch your condition.   Will get help right away if you are not doing well or get worse.  Document Released: 06/17/2005 Document Revised: 08/27/2011 Document Reviewed: 03/27/2011 [facility] Patient Information 2012 [place], [place].

## 2011-12-08 NOTE — Interval H&P Note (Signed)
History and Physical Interval Note:  12/08/2011 10:31 AM  Sara [name]  has presented today for surgery, with the diagnosis of dysphagia. GERD  The various methods of treatment have been discussed with the patient and family. After consideration of risks, benefits and other options for treatment, the patient has consented to  Procedure(s) (LRB): ESOPHAGOGASTRODUODENOSCOPY (EGD) WITH ESOPHAGEAL DILATION (N/A) as a surgical intervention .  The patients' history has been reviewed, patient examined, no change in status, stable for surgery.  I have reviewed the patients' chart and labs.  Questions were answered to the patient's satisfaction.     Eula Listen

## 2011-12-11 ENCOUNTER — Emergency Department (HOSPITAL_COMMUNITY)
Admission: EM | Admit: 2011-12-11 | Discharge: 2011-12-11 | Disposition: A | Discharge status: Home / Self Care | Payer: Medicare Other | Attending: Emergency Medicine | Admitting: Emergency Medicine

## 2011-12-11 ENCOUNTER — Other Ambulatory Visit: Payer: Self-pay | Admitting: Family Medicine

## 2011-12-11 ENCOUNTER — Encounter (HOSPITAL_COMMUNITY): Payer: Self-pay | Admitting: *Deleted

## 2011-12-11 ENCOUNTER — Emergency Department (HOSPITAL_COMMUNITY): Payer: Medicare Other

## 2011-12-11 ENCOUNTER — Other Ambulatory Visit: Payer: Self-pay

## 2011-12-11 DIAGNOSIS — E039 Hypothyroidism, unspecified: Secondary | ICD-10-CM | POA: Insufficient documentation

## 2011-12-11 DIAGNOSIS — R079 Chest pain, unspecified: Secondary | ICD-10-CM | POA: Insufficient documentation

## 2011-12-11 DIAGNOSIS — E7800 Pure hypercholesterolemia, unspecified: Secondary | ICD-10-CM | POA: Insufficient documentation

## 2011-12-11 DIAGNOSIS — M549 Dorsalgia, unspecified: Secondary | ICD-10-CM

## 2011-12-11 DIAGNOSIS — I10 Essential (primary) hypertension: Secondary | ICD-10-CM | POA: Insufficient documentation

## 2011-12-11 DIAGNOSIS — Z79899 Other long term (current) drug therapy: Secondary | ICD-10-CM | POA: Insufficient documentation

## 2011-12-11 DIAGNOSIS — G8929 Other chronic pain: Secondary | ICD-10-CM | POA: Insufficient documentation

## 2011-12-11 LAB — BASIC METABOLIC PANEL
BUN: 9 mg/dL (ref 6–23)
CO2: 32 mEq/L (ref 19–32)
Calcium: 9.4 mg/dL (ref 8.4–10.5)
Chloride: 100 mEq/L (ref 96–112)
Creatinine, Ser: 0.99 mg/dL (ref 0.50–1.10)
GFR calc Af Amer: 68 mL/min — ABNORMAL LOW (ref >90)
GFR calc non Af Amer: 59 mL/min — ABNORMAL LOW (ref >90)
Glucose, Bld: 98 mg/dL (ref 70–99)
Potassium: 3.9 mEq/L (ref 3.5–5.1)
Sodium: 140 mEq/L (ref 135–145)

## 2011-12-11 LAB — D-DIMER, QUANTITATIVE: D-Dimer, Quant: 0.29 ug/mL-FEU (ref 0.00–0.48)

## 2011-12-11 LAB — DIFFERENTIAL
Basophils Absolute: 0 10*3/uL (ref 0.0–0.1)
Basophils Relative: 0 % (ref 0–1)
Eosinophils Absolute: 0.1 10*3/uL (ref 0.0–0.7)
Eosinophils Relative: 2 % (ref 0–5)
Lymphocytes Relative: 45 % (ref 12–46)
Lymphs Abs: 3.2 10*3/uL (ref 0.7–4.0)
Monocytes Absolute: 0.5 10*3/uL (ref 0.1–1.0)
Monocytes Relative: 7 % (ref 3–12)
Neutro Abs: 3.3 10*3/uL (ref 1.7–7.7)
Neutrophils Relative %: 46 % (ref 43–77)

## 2011-12-11 LAB — CBC
HCT: 39.9 % (ref 36.0–46.0)
Hemoglobin: 13.3 g/dL (ref 12.0–15.0)
MCH: 30.9 pg (ref 26.0–34.0)
MCHC: 33.3 g/dL (ref 30.0–36.0)
MCV: 92.8 fL (ref 78.0–100.0)
Platelets: 257 10*3/uL (ref 150–400)
RBC: 4.3 MIL/uL (ref 3.87–5.11)
RDW: 14.1 % (ref 11.5–15.5)
WBC: 7.3 10*3/uL (ref 4.0–10.5)

## 2011-12-11 LAB — CARDIAC PANEL(CRET KIN+CKTOT+MB+TROPI)
CK, MB: 3.7 ng/mL (ref 0.3–4.0)
Relative Index: 0.9 (ref 0.0–2.5)
Total CK: 414 U/L — ABNORMAL HIGH (ref 7–177)
Troponin I: <0.3 ng/mL (ref <0.30)

## 2011-12-11 MED ORDER — ONDANSETRON HCL 8 MG PO TABS: 8.0000 mg | ORAL_TABLET | ORAL | Status: AC | PRN

## 2011-12-11 MED ORDER — TRAMADOL HCL 50 MG PO TABS: 50.0000 mg | ORAL_TABLET | Freq: Four times a day (QID) | ORAL | Status: AC | PRN

## 2011-12-11 NOTE — ED Notes (Addendum)
Lt chest pain onset this am,  Nausea, sob.  Feels tired.  Took 1 325 aspirin pta

## 2011-12-11 NOTE — ED Notes (Signed)
Family at bedside. Patient states she is comfortable and fine at this time. Patient states she would like something to drink. RN Kendal Hymen aware. Patient was told to wait till all test results were back and then she could have something to drink.

## 2011-12-11 NOTE — ED Notes (Signed)
Pt c/o pain under her left breast, feeling of fullness in her chest, nausea, and weakness. Pt states that she had an endoscopy on Tuesday and had her esophagus stretched. Pt states that she has not gotten the results back yet. Pt alert and oriented x 3. Skin warm and dry. Color pink. Breath sounds clear and equal bilaterally.

## 2011-12-11 NOTE — Discharge Instructions (Signed)
Tests were normal. Medications for pain and nausea. Rest. Followup your primary care Dr. Return if worse in any way.

## 2011-12-11 NOTE — ED Provider Notes (Signed)
History   This chart was scribed for Donnetta Hutching, MD by Charolett Bumpers . The patient was seen in room APA03/APA03 and the patient's care was started at 4:58pm.   CSN: [id number]  Arrival date & time 12/11/11  1520   First MD Initiated Contact with Patient 12/11/11 1640      Chief Complaint  Patient presents with  . Chest Pain    (Consider location/radiation/quality/duration/timing/severity/associated sxs/prior treatment) HPI Sara [name] is a 66 y.o. female who presents to the Emergency Department complaining of constant, moderate chest pain with associated SOB, decreased appetite, and a "fullness" feeling since this morning at 10:00 am. Patient also reports nausea and diaphoresis. Patient denies vomiting. Patient describes the chest pain as a "pushing of a knuckle" pain. Patient also reports right leg pain, and states that her SOB is worse when walking. Patient also states that she has intermittent pain in her left arm that last 45 seconds. Patient reports a h/o HTN and states that her sister had a MI at age 4. Patient denies smoking.   PCP: Dr. Guinevere Ferrari   Past Medical History  Diagnosis Date  . HTN (hypertension)   . Thyroid disease   . Chronic back pain   . Reflux   . Cervical spondylosis without myelopathy   . Degeneration of cervical intervertebral disc   . Unspecified arthropathy, shoulder region   . Pure hypercholesterolemia   . Allergic rhinitis due to pollen   . Cervicalgia   . Diverticulosis of colon (without mention of hemorrhage)   . Morbid obesity   . Other postablative hypothyroidism   . Palpitations   . Peptic ulcer, unspecified site, unspecified as acute or chronic, without mention of hemorrhage, perforation, or obstruction   . Peripheral vertigo, unspecified   . Reflux esophagitis   . Spinal stenosis in cervical region   . 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 Personal history  of tobacco use, presenting hazards to health   . Insomnia, unspecified   . Lumbago   . Primary localized osteoarthrosis, lower leg   . Hypertension   . Right leg pain     Past Surgical History  Procedure Date  . Knee surgery   . Vaginal hysterectomy   . Breast lumpectomy   . Tonsillectomy   . Appendectomy   . Colonoscopy Sept 2008    RMR: normal rectum, left-sided diverticula, repeat in 2018  . Abdominal hysterectomy   . Upper gastrointestinal endoscopy     Family History  Problem Relation Age of Onset  . Colon cancer Neg Hx     History  Substance Use Topics  . Smoking status: Former Smoker -- 1.0 packs/day for 27 years    Types: Cigarettes  . Smokeless tobacco: Former Neurosurgeon    Quit date: 10/02/1985  . Alcohol Use: Yes     occasional wine    OB History    Grav Para Term Preterm Abortions TAB SAB Ect Mult Living                  Review of Systems A complete 10 system review of systems was obtained and is otherwise negative except as noted in the HPI and PMH.   Allergies  Darvon; Propoxyphene hcl; and Latex  Home Medications   Current Outpatient Rx  Name Route Sig Dispense Refill  . ASPIRIN 325 MG PO TABS Oral Take 325 mg by mouth once as needed.    Marland Kitchen  ASPIRIN EC 81 MG PO TBEC Oral Take 81 mg by mouth daily.    . CYCLOBENZAPRINE HCL 10 MG PO TABS Oral Take 10 mg by mouth 3 (three) times daily as needed. For muscle spasms    . FLUTICASONE PROPIONATE 50 MCG/ACT NA SUSP Nasal Place 2 sprays into the nose at bedtime.     Marland Kitchen GABAPENTIN 600 MG PO TABS Oral Take 600 mg by mouth at bedtime.     Marland Kitchen HYDROCODONE-ACETAMINOPHEN 5-500 MG PO TABS Oral Take 1 tablet by mouth every 6 (six) hours as needed. For pain    . LEVOTHYROXINE SODIUM 88 MCG PO TABS Oral Take 88 mcg by mouth daily.    Marland Kitchen LISINOPRIL-HYDROCHLOROTHIAZIDE 20-12.5 MG PO TABS Oral Take 2 tablets by mouth daily.     Marland Kitchen LORAZEPAM 1 MG PO TABS Oral Take 1 mg by mouth at bedtime as needed. For sleep    . METOPROLOL  TARTRATE 25 MG PO TABS Oral Take 25 mg by mouth 2 (two) times daily.      Marland Kitchen NAPROXEN 500 MG PO TABS Oral Take 500 mg by mouth 2 (two) times daily with a meal.      . OMEPRAZOLE 40 MG PO CPDR Oral Take 40 mg by mouth daily.    Marland Kitchen PRAVASTATIN SODIUM 40 MG PO TABS Oral Take 40 mg by mouth daily.      BP 100/85  Pulse 86  Temp(Src) 97.7 F (36.5 C) (Oral)  Resp 20  Ht 5\' 6"  (1.676 m)  Wt 250 lb (113.399 kg)  BMI 40.35 kg/m2  SpO2 97%  Physical Exam  Nursing note and vitals reviewed. Constitutional: She is oriented to person, place, and time. She appears well-developed and well-nourished. No distress.  HENT:  Head: Normocephalic and atraumatic.  Right Ear: External ear normal.  Left Ear: External ear normal.  Nose: Nose normal.  Eyes: EOM are normal. Pupils are equal, round, and reactive to light.  Neck: Normal range of motion. Neck supple. No tracheal deviation present.  Cardiovascular: Normal rate, regular rhythm and normal heart sounds.  Exam reveals no gallop and no friction rub.   No murmur heard. Pulmonary/Chest: Effort normal and breath sounds normal. No respiratory distress. She has no wheezes. She has no rales.  Abdominal: Soft. Bowel sounds are normal. She exhibits no distension. There is no tenderness.  Musculoskeletal: Normal range of motion. She exhibits tenderness (Tenderness to distal right calf. ). She exhibits no edema.  Neurological: She is alert and oriented to person, place, and time. No sensory deficit.  Skin: Skin is warm and dry.  Psychiatric: She has a normal mood and affect. Her behavior is normal.    ED Course  Procedures (including critical care time)  DIAGNOSTIC STUDIES: Oxygen Saturation is 97% on room air, normal by my interpretation.    COORDINATION OF CARE:  1710: Discussed the planned course of treatment with the patient. Will order an x-ray, EKG, and blood work. Will check d-dimers for possible embolus.   Results for orders placed during the  hospital encounter of 12/11/11  CBC      Component Value Range   WBC 7.3  4.0 - 10.5 (K/uL)   RBC 4.30  3.87 - 5.11 (MIL/uL)   Hemoglobin 13.3  12.0 - 15.0 (g/dL)   HCT 16.1  09.6 - 04.5 (%)   MCV 92.8  78.0 - 100.0 (fL)   MCH 30.9  26.0 - 34.0 (pg)   MCHC 33.3  30.0 - 36.0 (g/dL)  RDW 14.1  11.5 - 15.5 (%)   Platelets 257  150 - 400 (K/uL)  DIFFERENTIAL      Component Value Range   Neutrophils Relative 46  43 - 77 (%)   Neutro Abs 3.3  1.7 - 7.7 (K/uL)   Lymphocytes Relative 45  12 - 46 (%)   Lymphs Abs 3.2  0.7 - 4.0 (K/uL)   Monocytes Relative 7  3 - 12 (%)   Monocytes Absolute 0.5  0.1 - 1.0 (K/uL)   Eosinophils Relative 2  0 - 5 (%)   Eosinophils Absolute 0.1  0.0 - 0.7 (K/uL)   Basophils Relative 0  0 - 1 (%)   Basophils Absolute 0.0  0.0 - 0.1 (K/uL)  BASIC METABOLIC PANEL      Component Value Range   Sodium 140  135 - 145 (mEq/L)   Potassium 3.9  3.5 - 5.1 (mEq/L)   Chloride 100  96 - 112 (mEq/L)   CO2 32  19 - 32 (mEq/L)   Glucose, Bld 98  70 - 99 (mg/dL)   BUN 9  6 - 23 (mg/dL)   Creatinine, Ser 4.78  0.50 - 1.10 (mg/dL)   Calcium 9.4  8.4 - 29.5 (mg/dL)   GFR calc non Af Amer 59 (*) >90 (mL/min)   GFR calc Af Amer 68 (*) >90 (mL/min)  CARDIAC PANEL(CRET KIN+CKTOT+MB+TROPI)      Component Value Range   Total CK 414 (*) 7 - 177 (U/L)   CK, MB 3.7  0.3 - 4.0 (ng/mL)   Troponin I <0.30  <0.30 (ng/mL)   Relative Index 0.9  0.0 - 2.5   D-DIMER, QUANTITATIVE      Component Value Range   D-Dimer, Quant 0.29  0.00 - 0.48 (ug/mL-FEU)    Dg Chest 2 View  12/11/2011  *RADIOLOGY REPORT*  Clinical Data: Left side chest pain for 1 week.  CHEST - 2 VIEW  Comparison: None.  Findings: Lungs are clear.  Heart size is normal.  No pneumothorax or effusion.  IMPRESSION: No acute disease.  Original Report Authenticated By: Bernadene Bell. Maricela Curet, M.D.     No diagnosis found.    MDM  Vague history for acute coronary syndrome. Screening tests negative. Patient looks well. EKG  cardiac enzyme and d-dimer all negative. Discharge home with Ultram and Zofran. Follow up with primary care Dr.  I personally performed the services described in this documentation, which was scribed in my presence. The recorded information has been reviewed and considered.        Donnetta Hutching, MD 12/11/11 2048

## 2011-12-13 ENCOUNTER — Encounter: Payer: Self-pay | Admitting: Internal Medicine

## 2011-12-24 ENCOUNTER — Ambulatory Visit
Admission: RE | Admit: 2011-12-24 | Discharge: 2011-12-24 | Disposition: A | Discharge status: Home / Self Care | Payer: Medicare Other | Source: Ambulatory Visit | Attending: Family Medicine | Admitting: Family Medicine

## 2011-12-24 ENCOUNTER — Other Ambulatory Visit: Payer: Self-pay | Admitting: Family Medicine

## 2011-12-24 VITALS — BP 133/76 | HR 85

## 2011-12-24 DIAGNOSIS — M549 Dorsalgia, unspecified: Secondary | ICD-10-CM

## 2011-12-24 MED ORDER — METHYLPREDNISOLONE ACETATE 40 MG/ML INJ SUSP (RADIOLOG
120.0000 mg | Freq: Once | INTRAMUSCULAR | Status: AC
  Administered 2011-12-24: 120 mg via EPIDURAL

## 2011-12-24 MED ORDER — IOHEXOL 180 MG/ML  SOLN
1.0000 mL | Freq: Once | INTRAMUSCULAR | Status: AC | PRN
  Administered 2011-12-24: 1 mL via EPIDURAL

## 2011-12-24 NOTE — Discharge Instructions (Signed)

## 2011-12-31 ENCOUNTER — Ambulatory Visit (HOSPITAL_COMMUNITY)
Admission: RE | Admit: 2011-12-31 | Discharge: 2011-12-31 | Disposition: A | Discharge status: Home / Self Care | Payer: Medicare Other | Source: Ambulatory Visit | Attending: Family Medicine | Admitting: Family Medicine

## 2011-12-31 ENCOUNTER — Other Ambulatory Visit (HOSPITAL_COMMUNITY): Payer: Self-pay | Admitting: Family Medicine

## 2011-12-31 DIAGNOSIS — M545 Low back pain, unspecified: Secondary | ICD-10-CM

## 2012-01-22 ENCOUNTER — Other Ambulatory Visit: Payer: Self-pay | Admitting: Family Medicine

## 2012-01-22 DIAGNOSIS — M549 Dorsalgia, unspecified: Secondary | ICD-10-CM

## 2012-01-28 ENCOUNTER — Ambulatory Visit
Admission: RE | Admit: 2012-01-28 | Discharge: 2012-01-28 | Disposition: A | Discharge status: Home / Self Care | Payer: Medicare Other | Source: Ambulatory Visit | Attending: Family Medicine | Admitting: Family Medicine

## 2012-01-28 DIAGNOSIS — M549 Dorsalgia, unspecified: Secondary | ICD-10-CM

## 2012-01-28 MED ORDER — METHYLPREDNISOLONE ACETATE 40 MG/ML INJ SUSP (RADIOLOG
120.0000 mg | Freq: Once | INTRAMUSCULAR | Status: AC
  Administered 2012-01-28: 120 mg via EPIDURAL

## 2012-01-28 MED ORDER — IOHEXOL 180 MG/ML  SOLN
1.0000 mL | Freq: Once | INTRAMUSCULAR | Status: AC | PRN
  Administered 2012-01-28: 1 mL via EPIDURAL

## 2012-02-09 ENCOUNTER — Encounter: Payer: Self-pay | Admitting: Orthopedic Surgery

## 2012-02-09 ENCOUNTER — Ambulatory Visit: Payer: Medicare Other

## 2012-02-09 ENCOUNTER — Ambulatory Visit (INDEPENDENT_AMBULATORY_CARE_PROVIDER_SITE_OTHER): Payer: Medicare Other | Admitting: Orthopedic Surgery

## 2012-02-09 ENCOUNTER — Ambulatory Visit (INDEPENDENT_AMBULATORY_CARE_PROVIDER_SITE_OTHER): Payer: Medicare Other

## 2012-02-09 VITALS — BP 90/62 | Ht 66.0 in | Wt 249.0 lb

## 2012-02-09 DIAGNOSIS — M25569 Pain in unspecified knee: Secondary | ICD-10-CM

## 2012-02-09 DIAGNOSIS — IMO0002 Reserved for concepts with insufficient information to code with codable children: Secondary | ICD-10-CM

## 2012-02-09 DIAGNOSIS — M1710 Unilateral primary osteoarthritis, unspecified knee: Secondary | ICD-10-CM | POA: Insufficient documentation

## 2012-02-09 NOTE — Progress Notes (Signed)
  Subjective:    Sara [name] Mom is a 66 y.o. female who presents with bilateral knee pain right greater than left status post right knee arthroscopy many years ago. The patient had an epidural injection for her right lower extremity symptoms which have somewhat localized to the right hip and right thigh and she has noticed separate pain regarding the right knee. She had a right knee arthroscopy several years ago we found that she had arthritis and that has been an ongoing problem  She has not had any catching locking or giving way but she does have crepitance.  Her right hip still bothers her even after the injection  She had a recent x-ray which showed degenerative disc disease.  Her knees have been swelling  Review of systems she has weight gain no weight loss despite trying to lose weight. No fever no chills no fatigue. No frequency urgency or difficulty with urination. No constipation or diarrhea.  BP 90/62  Ht 5\' 6"  (1.676 m)  Wt 249 lb (112.946 kg)  BMI 40.19 kg/m2 She is modestly overweight.  Appearance otherwise normal. Oriented x3. Mood normal. Ambulation without assistive device though her gait is slow and her stride length is definitely decreased and she has decreased flexion and stance.  Bilateral knee exam inspection she has bilateral fullness of the joint suggesting synovitis there is some warmth range of motion is free and normal knees are stable strength is normal skin is intact pulses are good no edema sensation is normal distally.  X-rays show moderate arthritis of both knee joints  Recommend bilateral injections  Assessment:    Bilateral Moderate inflammatory arthritis bilaterally    Plan:    Arthrocentesis. See procedure note.   Knee  Injection Procedure Note  Pre-operative Diagnosis: left knee oa  Post-operative Diagnosis: same  Indications: pain  Anesthesia: ethyl chloride   Procedure Details   Verbal consent was obtained for the procedure. Time  out was completed.The joint was prepped with alcohol, followed by  Ethyl chloride spray and A 20 gauge needle was inserted into the knee via lateral approach; 4ml 1% lidocaine and 1 ml of depomedrol  was then injected into the joint . The needle was removed and the area cleansed and dressed.  Complications:  None; patient tolerated the procedure well. Knee  Injection Procedure Note  Pre-operative Diagnosis: right knee oa  Post-operative Diagnosis: same  Indications: pain  Anesthesia: ethyl chloride   Procedure Details   Verbal consent was obtained for the procedure. Time out was completed.The joint was prepped with alcohol, followed by  Ethyl chloride spray and A 20 gauge needle was inserted into the knee via lateral approach; 4ml 1% lidocaine and 1 ml of depomedrol  was then injected into the joint . The needle was removed and the area cleansed and dressed.  Complications:  None; patient tolerated the procedure well.

## 2012-02-09 NOTE — Patient Instructions (Signed)
You have received a steroid shot. 15% of patients experience increased pain at the injection site with in the next 24 hours. This is best treated with ice and tylenol extra strength 2 tabs every 8 hours. If you are still having pain please call the office.   OSTEOBIFLEX SUPPLEMENTS

## 2012-02-12 ENCOUNTER — Telehealth: Payer: Self-pay | Admitting: Orthopedic Surgery

## 2012-02-12 NOTE — Telephone Encounter (Signed)
Patient called to verify -- is the referral to neurosurgeon to be made by primary care physician Dr Sudie Bailey?  Chart note from visit her 02/09/12 does not indicate referral from here.  Letter to be dictated to Dr Sudie Bailey about the referral?  Please advise.  Patient ph# is [phone].

## 2012-02-14 NOTE — Telephone Encounter (Signed)
?   Did  i say she needs that

## 2012-02-16 NOTE — Telephone Encounter (Signed)
Sara [name] did I ask for a referral I dont have a record of that

## 2012-02-16 NOTE — Telephone Encounter (Signed)
I did not see this noted either, nor did I see a letter in reference to this referral to patient's primary care physician.  I called back to patient to further inquire.  She states that when the nurse was discussing her history with her, and the fact that she had previous epidural steroid injections, and she said that nurse asked her if she had ever seen an neurosurgeon before, which patient states she has not.  This is evidently as far as the conversation had gone.  I again asked patient if Dr. Romeo Apple discussed referring her to neurosurgeon, and she said he really had not.

## 2012-02-17 NOTE — Telephone Encounter (Addendum)
Called back to patient, left message on answer machine. Patient called back, relayed.

## 2012-02-17 NOTE — Telephone Encounter (Signed)
i am not answering any more questions related to this except face to face

## 2012-02-17 NOTE — Telephone Encounter (Signed)
Not needed at this time

## 2012-04-13 ENCOUNTER — Encounter (HOSPITAL_COMMUNITY): Payer: Self-pay | Admitting: Pharmacy Technician

## 2012-04-21 ENCOUNTER — Other Ambulatory Visit: Payer: Self-pay | Admitting: Neurosurgery

## 2012-04-25 ENCOUNTER — Encounter (HOSPITAL_COMMUNITY)
Admission: RE | Admit: 2012-04-25 | Discharge: 2012-04-25 | Disposition: A | Discharge status: Home / Self Care | Payer: Medicare Other | Source: Ambulatory Visit | Attending: Neurosurgery | Admitting: Neurosurgery

## 2012-04-25 ENCOUNTER — Encounter (HOSPITAL_COMMUNITY): Payer: Self-pay

## 2012-04-25 DIAGNOSIS — Z01818 Encounter for other preprocedural examination: Secondary | ICD-10-CM | POA: Insufficient documentation

## 2012-04-25 DIAGNOSIS — Z01812 Encounter for preprocedural laboratory examination: Secondary | ICD-10-CM | POA: Insufficient documentation

## 2012-04-25 HISTORY — DX: Gastro-esophageal reflux disease without esophagitis: K21.9

## 2012-04-25 HISTORY — DX: Shortness of breath: R06.02

## 2012-04-25 HISTORY — DX: Personal history of other diseases of the digestive system: Z87.19

## 2012-04-25 HISTORY — DX: Headache: R51

## 2012-04-25 LAB — TYPE AND SCREEN
ABO/RH(D): B POS
Antibody Screen: NEGATIVE

## 2012-04-25 LAB — BASIC METABOLIC PANEL
BUN: 7 mg/dL (ref 6–23)
CO2: 26 mEq/L (ref 19–32)
Calcium: 8.9 mg/dL (ref 8.4–10.5)
Chloride: 97 mEq/L (ref 96–112)
Creatinine, Ser: 0.8 mg/dL (ref 0.50–1.10)
GFR calc Af Amer: 88 mL/min — ABNORMAL LOW (ref >90)
GFR calc non Af Amer: 76 mL/min — ABNORMAL LOW (ref >90)
Glucose, Bld: 94 mg/dL (ref 70–99)
Potassium: 3.7 mEq/L (ref 3.5–5.1)
Sodium: 134 mEq/L — ABNORMAL LOW (ref 135–145)

## 2012-04-25 LAB — ABO/RH: ABO/RH(D): B POS

## 2012-04-25 LAB — CBC
HCT: 40.6 % (ref 36.0–46.0)
Hemoglobin: 13.4 g/dL (ref 12.0–15.0)
MCH: 31 pg (ref 26.0–34.0)
MCHC: 33 g/dL (ref 30.0–36.0)
MCV: 94 fL (ref 78.0–100.0)
Platelets: 231 10*3/uL (ref 150–400)
RBC: 4.32 MIL/uL (ref 3.87–5.11)
RDW: 13.1 % (ref 11.5–15.5)
WBC: 7.4 10*3/uL (ref 4.0–10.5)

## 2012-04-25 LAB — SURGICAL PCR SCREEN
MRSA, PCR: NEGATIVE
Staphylococcus aureus: NEGATIVE

## 2012-04-25 NOTE — Pre-Procedure Instructions (Signed)
20 Sara [name]  04/25/2012   Your procedure is scheduled on:  04/28/2012  Report to [facility] at 9:30 AM.  Call this number if you have problems the morning of surgery: [phone]   Remember:   Do not eat food or drink liquids :After Midnight.   WEDNESDAY    Take these medicines the morning of surgery with A SIP OF WATER: Flexeril, pain medicine if needed ,levothyroxine, meclizine, metoprolol, prilosec    Do not wear jewelry, make-up or nail polish.  Do not wear lotions, powders, or perfumes. You may wear deodorant.  Do not shave 48 hours prior to surgery. Men may shave face and neck.  Do not bring valuables to the hospital.  Contacts, dentures or bridgework may not be worn into surgery.  Leave suitcase in the car. After surgery it may be brought to your room.  For patients admitted to the hospital, checkout time is 11:00 AM the day of discharge.   Patients discharged the day of surgery will not be allowed to drive home.  Name and phone number of your driver: with spouse  Special Instructions: CHG Shower Use Special Wash: 1/2 bottle night before surgery and 1/2 bottle morning of surgery.   Please read over the following fact sheets that you were given: Pain Booklet, Coughing and Deep Breathing, Blood Transfusion Information, MRSA Information and Surgical Site Infection Prevention

## 2012-04-25 NOTE — Progress Notes (Signed)
Spoke with Bonita Quin in MR at [facility], requested note fr. 12/11/2011  ED visit be faxed to [facility].

## 2012-04-26 ENCOUNTER — Encounter (HOSPITAL_COMMUNITY): Payer: Self-pay | Admitting: Vascular Surgery

## 2012-04-26 NOTE — Consult Note (Signed)
Anesthesia Chart Review:  Patient is a 66 year old female scheduled for left L3-4, L4-5 XLIF on 04/28/12 by Dr. Gerlene Fee.  History includes former smoker, morbid obesity with BMI 41.65, hypercholesterolemia, toxic goiter with postablative hypothyroidism, PUD, OA, hiatal hernia, HTN, headaches, vertigo, palpitations, c-spine.  PCP is Dr. Sudie Bailey.  She was evaluated in the ED on 12/11/11 for chest pain.  She ruled out for ACS and PE by labs/diagnostic studies.  I called her this morning to further inquire.  She reports that she saw Dr. Sudie Bailey after that ED visit.  She has not had any further cardiac studies.  She continues to have intermittent mid chest pain every few days that last a couple of minutes.  Pain feels like pressure and goes away after she rubs her chest.  She has been told in the past that her pain is musculoskeletal in nature.  Pain usually occurs at sitting, but she admits to only being able to stand for a couple of minutes at a time due to pain.  She denies chest pain currently.  She has chronic SOB, occasionally with rest.  She has LE edema, which she feels is a little worse (she reports ~ 6lb weight gain in the past month).  Her sister had an MI @ age 61.  Her father died at ~33 from CHF/CM.  She has been evaluated by [facility] Cardiologist in the past for atypical chest pain and palpitations ('04) and intermittent vertigo ('11).  She last saw Dr. Valera Castle and PRN follow-up was recommended by his note on 09/12/10.    CXR on 12/11/11 showed no acute disease.  EKG on 12/11/11 showed NSR, right atrial enlargement, possible anterior infarct,age undetermined (cited on or before 08/01/10).  She had a normal myocardial perfusion on nuclear stress, EF 61%, but that was back in 2004.    Labs noted.  She did not tell her PAT nurse about recent chest pain and  admits to not talking with Dr. Sudie Bailey about continued chest pain symptoms since March 2013.  She did not tell Dr. Sudie Bailey about her  planned surgery.  I reviewed with Anesthesiologist Dr. Michelle Piper who agrees that due to her persistent chest pain with multiple risk factors for CAD that  she should be re-evaluated by Cardiology pre-operatively.  Erie Noe at Dr. Trudee Grip office notified.  Shonna Chock, PA-C

## 2012-04-28 ENCOUNTER — Encounter (HOSPITAL_COMMUNITY): Admission: RE | Payer: Self-pay | Source: Ambulatory Visit

## 2012-04-28 ENCOUNTER — Ambulatory Visit (HOSPITAL_COMMUNITY): Admission: RE | Admit: 2012-04-28 | Payer: Medicare Other | Source: Ambulatory Visit | Admitting: Neurosurgery

## 2012-04-28 SURGERY — ANTERIOR LATERAL LUMBAR FUSION 2 LEVELS
Anesthesia: General

## 2012-04-29 ENCOUNTER — Ambulatory Visit: Payer: Medicare Other | Admitting: Cardiovascular Disease

## 2012-04-29 ENCOUNTER — Encounter: Payer: Self-pay | Admitting: Cardiovascular Disease

## 2012-04-29 ENCOUNTER — Ambulatory Visit (INDEPENDENT_AMBULATORY_CARE_PROVIDER_SITE_OTHER): Payer: Medicare Other | Admitting: Cardiovascular Disease

## 2012-04-29 VITALS — BP 110/82 | HR 99 | Ht 66.0 in | Wt 267.0 lb

## 2012-04-29 DIAGNOSIS — E785 Hyperlipidemia, unspecified: Secondary | ICD-10-CM

## 2012-04-29 DIAGNOSIS — I10 Essential (primary) hypertension: Secondary | ICD-10-CM

## 2012-04-29 DIAGNOSIS — M48061 Spinal stenosis, lumbar region without neurogenic claudication: Secondary | ICD-10-CM

## 2012-04-29 NOTE — Patient Instructions (Signed)
Cleared for surgery per Dr. Eden Emms.  Follow up as needed.

## 2012-04-29 NOTE — Progress Notes (Signed)
Patient ID: Sara [name], female   DOB: [birth year], 66 y.o.   MRN: [id number] 66 yo patient of Dr Daleen Squibb last seen in 2011 at that time she had dizzyness with no w/u indicated and Dr Daleen Squibb felt tilt table not needed.  Carotids normal  She is referred for preop clearence by Dr Gerlene Fee.  She was to have lumbar fusion yesterday but anesthesia concerned about history.  In talking to patient she had a couple of days of atypical chest pain.  48 hours central improved by rubbing on chest.  Not exertional.  Seen in ER a year or so ago for "muscular pain"  She saw [facility] in 2009 and had a normal echo and normal pharmacologic myovue with EF 80%.  Nondiabetic.  On Rx for HTN and lipids.  Activity limited by knee and back pain.  No previous anesthetic problems.    ROS: Denies fever, malais, weight loss, blurry vision, decreased visual acuity, cough, sputum, SOB, hemoptysis, pleuritic pain, palpitaitons, heartburn, abdominal pain, melena, lower extremity edema, claudication, or rash.  All other systems reviewed and negative   General: Affect appropriate Healthy:  appears stated age HEENT: normal Neck supple with no adenopathy JVP normal no bruits no thyromegaly Lungs clear with no wheezing and good diaphragmatic motion Heart:  S1/S2 no murmur,rub, gallop or click PMI normal Abdomen: benighn, BS positve, no tenderness, no AAA no bruit.  No HSM or HJR Distal pulses intact with no bruits No edema Neuro non-focal Skin warm and dry No muscular weakness  Medications Current Outpatient Prescriptions  Medication Sig Dispense Refill  . Butalbital-APAP-Caffeine 50-300-40 MG CAPS Take 1 tablet by mouth every 6 (six) hours as needed. For severe headache      . cyclobenzaprine (FLEXERIL) 10 MG tablet Take 10 mg by mouth 2 (two) times daily as needed. For muscle spasms      . fluticasone (FLONASE) 50 MCG/ACT nasal spray Place 2 sprays into the nose at bedtime.       . gabapentin (NEURONTIN) 600 MG tablet Take 600  mg by mouth at bedtime.       Marland Kitchen HYDROmorphone (DILAUDID) 4 MG tablet Take 4 mg by mouth every 4 (four) hours as needed. For pain      . levothyroxine (SYNTHROID, LEVOTHROID) 88 MCG tablet Take 88 mcg by mouth daily before breakfast.       . lisinopril-hydrochlorothiazide (PRINZIDE,ZESTORETIC) 20-12.5 MG per tablet Take 2 tablets by mouth daily with breakfast.       . LORazepam (ATIVAN) 1 MG tablet Take 1 mg by mouth at bedtime as needed. For sleep      . meclizine (ANTIVERT) 25 MG tablet Take 25 mg by mouth 4 (four) times daily as needed.      . metoprolol tartrate (LOPRESSOR) 25 MG tablet Take 25 mg by mouth 2 (two) times daily.       . naproxen (NAPROSYN) 500 MG tablet Take 500 mg by mouth 2 (two) times daily with a meal.       . omeprazole (PRILOSEC) 40 MG capsule Take 40 mg by mouth daily before breakfast.       . pravastatin (PRAVACHOL) 40 MG tablet Take 40 mg by mouth daily with breakfast.       . DISCONTD: Ranitidine HCl (ZANTAC 75 PO) Take by mouth.         Current Facility-Administered Medications  Medication Dose Route Frequency Provider Last Rate Last Dose  . DISCONTD: methylPREDNISolone acetate (DEPO-MEDROL) injection 40 mg  40  mg Intra-articular Once Vickki Hearing, MD        Allergies Darvon; Propoxyphene hcl; and Latex  Family History: Family History  Problem Relation Age of Onset  . Colon cancer Neg Hx     Social History: History   Social History  . Marital Status: Married    Spouse Name: N/A    Number of Children: N/A  . Years of Education: N/A   Occupational History  . disability     since 2008, knee surgery   Social History Main Topics  . Smoking status: Former Smoker -- 1.0 packs/day for 27 years    Types: Cigarettes  . Smokeless tobacco: Former Neurosurgeon    Quit date: 10/02/1985  . Alcohol Use: Yes     occasional wine  . Drug Use: No  . Sexually Active: Not on file   Other Topics Concern  . Not on file   Social History Narrative  . No narrative  on file   Electrocardiogram:  NSR rate 86 poor R wave progression from body habitus.  12/11/11  Assessment and Plan

## 2012-04-29 NOTE — Assessment & Plan Note (Signed)
Clear to have back surgery with Dr Gerlene Fee with no further testing

## 2012-04-29 NOTE — Assessment & Plan Note (Signed)
Well controlled.  Continue current medications and low sodium Dash type diet.    

## 2012-04-29 NOTE — Assessment & Plan Note (Signed)
Cholesterol is at goal.  Continue current dose of statin and diet Rx.  No myalgias or side effects.  F/U  LFT's in 6 months. Lab Results  Component Value Date   LDLCALC 105* 08/10/2008             

## 2012-05-03 ENCOUNTER — Encounter (HOSPITAL_COMMUNITY): Payer: Self-pay | Admitting: Respiratory Therapy

## 2012-05-12 ENCOUNTER — Encounter (HOSPITAL_COMMUNITY): Payer: Self-pay | Admitting: *Deleted

## 2012-05-12 MED ORDER — CEFAZOLIN SODIUM-DEXTROSE 2-3 GM-% IV SOLR
2.0000 g | INTRAVENOUS | Status: AC
  Administered 2012-05-13: 2 g via INTRAVENOUS
  Filled 2012-05-12: qty 50

## 2012-05-12 NOTE — Progress Notes (Addendum)
Cardiac Clearance from Dr.Nishan in epic from 04/29/12  Echo/Stress test in epic from 2004  Denies ever having a heart catherization  CXR and EKG in epic from 12/11/11  Medical MD is Dr.Knowlton in [place]

## 2012-05-12 NOTE — Progress Notes (Signed)
Sleep study reports in epic from 2012 and 2013

## 2012-05-13 ENCOUNTER — Inpatient Hospital Stay (HOSPITAL_COMMUNITY)
Admission: RE | Admit: 2012-05-13 | Discharge: 2012-05-17 | DRG: 460 | Disposition: A | Discharge status: Home / Self Care | Payer: Medicare Other | Source: Ambulatory Visit | Attending: Neurosurgery | Admitting: Neurosurgery

## 2012-05-13 ENCOUNTER — Inpatient Hospital Stay (HOSPITAL_COMMUNITY): Payer: Medicare Other

## 2012-05-13 ENCOUNTER — Encounter (HOSPITAL_COMMUNITY): Payer: Self-pay | Admitting: Anesthesiology

## 2012-05-13 ENCOUNTER — Encounter (HOSPITAL_COMMUNITY)
Admission: RE | Disposition: A | Discharge status: Home / Self Care | Payer: Self-pay | Source: Ambulatory Visit | Attending: Neurosurgery

## 2012-05-13 ENCOUNTER — Inpatient Hospital Stay (HOSPITAL_COMMUNITY): Payer: Medicare Other | Admitting: Anesthesiology

## 2012-05-13 DIAGNOSIS — Z6841 Body Mass Index (BMI) 40.0 and over, adult: Secondary | ICD-10-CM

## 2012-05-13 DIAGNOSIS — M4802 Spinal stenosis, cervical region: Secondary | ICD-10-CM

## 2012-05-13 DIAGNOSIS — M48061 Spinal stenosis, lumbar region without neurogenic claudication: Secondary | ICD-10-CM | POA: Diagnosis present

## 2012-05-13 DIAGNOSIS — E039 Hypothyroidism, unspecified: Secondary | ICD-10-CM | POA: Diagnosis present

## 2012-05-13 DIAGNOSIS — M4310 Spondylolisthesis, site unspecified: Principal | ICD-10-CM | POA: Diagnosis present

## 2012-05-13 DIAGNOSIS — G4700 Insomnia, unspecified: Secondary | ICD-10-CM | POA: Diagnosis present

## 2012-05-13 HISTORY — DX: Insomnia, unspecified: G47.00

## 2012-05-13 HISTORY — DX: Sleep apnea, unspecified: G47.30

## 2012-05-13 HISTORY — DX: Constipation, unspecified: K59.00

## 2012-05-13 HISTORY — PX: ANTERIOR LAT LUMBAR FUSION: SHX1168

## 2012-05-13 LAB — CBC
HCT: 40.7 % (ref 36.0–46.0)
Hemoglobin: 13.8 g/dL (ref 12.0–15.0)
MCH: 31.2 pg (ref 26.0–34.0)
MCHC: 33.9 g/dL (ref 30.0–36.0)
MCV: 92.1 fL (ref 78.0–100.0)
Platelets: 273 10*3/uL (ref 150–400)
RBC: 4.42 MIL/uL (ref 3.87–5.11)
RDW: 13 % (ref 11.5–15.5)
WBC: 8.2 10*3/uL (ref 4.0–10.5)

## 2012-05-13 LAB — BASIC METABOLIC PANEL
BUN: 7 mg/dL (ref 6–23)
CO2: 26 mEq/L (ref 19–32)
Calcium: 9.3 mg/dL (ref 8.4–10.5)
Chloride: 100 mEq/L (ref 96–112)
Creatinine, Ser: 0.84 mg/dL (ref 0.50–1.10)
GFR calc Af Amer: 83 mL/min — ABNORMAL LOW (ref >90)
GFR calc non Af Amer: 71 mL/min — ABNORMAL LOW (ref >90)
Glucose, Bld: 109 mg/dL — ABNORMAL HIGH (ref 70–99)
Potassium: 3.5 mEq/L (ref 3.5–5.1)
Sodium: 138 mEq/L (ref 135–145)

## 2012-05-13 LAB — TYPE AND SCREEN
ABO/RH(D): B POS
Antibody Screen: NEGATIVE

## 2012-05-13 SURGERY — ANTERIOR LATERAL LUMBAR FUSION 2 LEVELS
Anesthesia: General | Site: Spine Lumbar | Laterality: Left | Wound class: Clean

## 2012-05-13 MED ORDER — MIDAZOLAM HCL 5 MG/5ML IJ SOLN
INTRAMUSCULAR | Status: DC | PRN
  Administered 2012-05-13: 2 mg via INTRAVENOUS

## 2012-05-13 MED ORDER — ONDANSETRON HCL 4 MG/2ML IJ SOLN
INTRAMUSCULAR | Status: DC | PRN
  Administered 2012-05-13: 4 mg via INTRAVENOUS

## 2012-05-13 MED ORDER — FLUTICASONE PROPIONATE 50 MCG/ACT NA SUSP
2.0000 | Freq: Every day | NASAL | Status: DC
  Administered 2012-05-13 – 2012-05-16 (×4): 2 via NASAL
  Filled 2012-05-13 (×2): qty 16

## 2012-05-13 MED ORDER — LIDOCAINE HCL (CARDIAC) 20 MG/ML IV SOLN
INTRAVENOUS | Status: DC | PRN
  Administered 2012-05-13: 100 mg via INTRAVENOUS

## 2012-05-13 MED ORDER — PHENOL 1.4 % MT LIQD: 1.0000 | OROMUCOSAL | Status: DC | PRN

## 2012-05-13 MED ORDER — FENTANYL CITRATE 0.05 MG/ML IJ SOLN
INTRAMUSCULAR | Status: DC | PRN
  Administered 2012-05-13: 200 ug via INTRAVENOUS
  Administered 2012-05-13: 25 ug via INTRAVENOUS
  Administered 2012-05-13 (×4): 50 ug via INTRAVENOUS
  Administered 2012-05-13: 25 ug via INTRAVENOUS
  Administered 2012-05-13: 50 ug via INTRAVENOUS

## 2012-05-13 MED ORDER — SODIUM CHLORIDE 0.9 % IJ SOLN
3.0000 mL | Freq: Two times a day (BID) | INTRAMUSCULAR | Status: DC
  Administered 2012-05-13 – 2012-05-16 (×5): 3 mL via INTRAVENOUS

## 2012-05-13 MED ORDER — PANTOPRAZOLE SODIUM 40 MG PO TBEC
40.0000 mg | DELAYED_RELEASE_TABLET | Freq: Every day | ORAL | Status: DC
  Administered 2012-05-14 – 2012-05-17 (×4): 40 mg via ORAL
  Filled 2012-05-13 (×5): qty 1

## 2012-05-13 MED ORDER — SODIUM CHLORIDE 0.9 % IR SOLN
Status: DC | PRN
  Administered 2012-05-13 (×2)

## 2012-05-13 MED ORDER — KCL IN DEXTROSE-NACL 20-5-0.45 MEQ/L-%-% IV SOLN
80.0000 mL/h | INTRAVENOUS | Status: DC
  Administered 2012-05-14: 80 mL/h via INTRAVENOUS
  Filled 2012-05-13 (×10): qty 1000

## 2012-05-13 MED ORDER — ACETAMINOPHEN 325 MG PO TABS
650.0000 mg | ORAL_TABLET | ORAL | Status: DC | PRN
  Filled 2012-05-13: qty 1

## 2012-05-13 MED ORDER — HYDROMORPHONE HCL PF 1 MG/ML IJ SOLN
INTRAMUSCULAR | Status: AC
  Filled 2012-05-13: qty 1

## 2012-05-13 MED ORDER — HYDROMORPHONE HCL PF 1 MG/ML IJ SOLN
1.0000 mg | INTRAMUSCULAR | Status: DC | PRN
  Administered 2012-05-13 – 2012-05-15 (×7): 1 mg via INTRAMUSCULAR
  Filled 2012-05-13 (×7): qty 1

## 2012-05-13 MED ORDER — ROCURONIUM BROMIDE 100 MG/10ML IV SOLN
INTRAVENOUS | Status: DC | PRN
  Administered 2012-05-13: 50 mg via INTRAVENOUS

## 2012-05-13 MED ORDER — ALBUMIN HUMAN 5 % IV SOLN
INTRAVENOUS | Status: DC | PRN
  Administered 2012-05-13 (×2): via INTRAVENOUS

## 2012-05-13 MED ORDER — HYDROCHLOROTHIAZIDE 25 MG PO TABS
25.0000 mg | ORAL_TABLET | Freq: Every day | ORAL | Status: DC
  Administered 2012-05-15 – 2012-05-17 (×3): 25 mg via ORAL
  Filled 2012-05-13 (×5): qty 1

## 2012-05-13 MED ORDER — OXYCODONE HCL 5 MG/5ML PO SOLN: 5.0000 mg | Freq: Once | ORAL | Status: DC | PRN

## 2012-05-13 MED ORDER — 0.9 % SODIUM CHLORIDE (POUR BTL) OPTIME
TOPICAL | Status: DC | PRN
  Administered 2012-05-13 (×2): 1000 mL

## 2012-05-13 MED ORDER — CEFAZOLIN SODIUM-DEXTROSE 2-3 GM-% IV SOLR
2.0000 g | Freq: Three times a day (TID) | INTRAVENOUS | Status: AC
  Administered 2012-05-13 – 2012-05-14 (×2): 2 g via INTRAVENOUS
  Filled 2012-05-13 (×2): qty 50

## 2012-05-13 MED ORDER — PROPOFOL 10 MG/ML IV EMUL
INTRAVENOUS | Status: DC | PRN
  Administered 2012-05-13: 50 ug/kg/min via INTRAVENOUS

## 2012-05-13 MED ORDER — LEVOTHYROXINE SODIUM 88 MCG PO TABS
88.0000 ug | ORAL_TABLET | Freq: Every day | ORAL | Status: DC
  Administered 2012-05-14 – 2012-05-17 (×4): 88 ug via ORAL
  Filled 2012-05-13 (×5): qty 1

## 2012-05-13 MED ORDER — HEMOSTATIC AGENTS (NO CHARGE) OPTIME
TOPICAL | Status: DC | PRN
  Administered 2012-05-13: 1 via TOPICAL

## 2012-05-13 MED ORDER — GLYCOPYRROLATE 0.2 MG/ML IJ SOLN
INTRAMUSCULAR | Status: DC | PRN
  Administered 2012-05-13: .6 mg via INTRAVENOUS

## 2012-05-13 MED ORDER — PHENYLEPHRINE HCL 10 MG/ML IJ SOLN
10.0000 mg | INTRAVENOUS | Status: DC | PRN
  Administered 2012-05-13: 10 ug/min via INTRAVENOUS

## 2012-05-13 MED ORDER — LISINOPRIL 40 MG PO TABS
40.0000 mg | ORAL_TABLET | Freq: Every day | ORAL | Status: DC
  Administered 2012-05-15 – 2012-05-17 (×3): 40 mg via ORAL
  Filled 2012-05-13 (×5): qty 1

## 2012-05-13 MED ORDER — CYCLOBENZAPRINE HCL 10 MG PO TABS
10.0000 mg | ORAL_TABLET | Freq: Three times a day (TID) | ORAL | Status: DC | PRN
  Administered 2012-05-13 – 2012-05-16 (×6): 10 mg via ORAL
  Filled 2012-05-13 (×9): qty 1

## 2012-05-13 MED ORDER — PHENYLEPHRINE HCL 10 MG/ML IJ SOLN
INTRAMUSCULAR | Status: DC | PRN
  Administered 2012-05-13: 80 ug via INTRAVENOUS
  Administered 2012-05-13 (×5): 40 ug via INTRAVENOUS
  Administered 2012-05-13: 80 ug via INTRAVENOUS
  Administered 2012-05-13 (×5): 40 ug via INTRAVENOUS

## 2012-05-13 MED ORDER — METOPROLOL TARTRATE 25 MG PO TABS
25.0000 mg | ORAL_TABLET | Freq: Two times a day (BID) | ORAL | Status: DC
  Administered 2012-05-14 – 2012-05-17 (×6): 25 mg via ORAL
  Filled 2012-05-13 (×10): qty 1

## 2012-05-13 MED ORDER — THROMBIN 5000 UNITS EX KIT
PACK | CUTANEOUS | Status: DC | PRN
  Administered 2012-05-13 (×2): 5000 [IU] via TOPICAL

## 2012-05-13 MED ORDER — HYDROCODONE-ACETAMINOPHEN 5-325 MG PO TABS
1.0000 | ORAL_TABLET | ORAL | Status: DC | PRN
  Administered 2012-05-13 (×3): 2 via ORAL
  Administered 2012-05-14: 1 via ORAL
  Administered 2012-05-14 – 2012-05-15 (×4): 2 via ORAL
  Filled 2012-05-13 (×8): qty 2

## 2012-05-13 MED ORDER — SODIUM CHLORIDE 0.9 % IJ SOLN: 3.0000 mL | INTRAMUSCULAR | Status: DC | PRN

## 2012-05-13 MED ORDER — MECLIZINE HCL 25 MG PO TABS
25.0000 mg | ORAL_TABLET | Freq: Four times a day (QID) | ORAL | Status: DC | PRN
  Administered 2012-05-14: 25 mg via ORAL
  Filled 2012-05-13: qty 1

## 2012-05-13 MED ORDER — DROPERIDOL 2.5 MG/ML IJ SOLN: 0.6250 mg | INTRAMUSCULAR | Status: DC | PRN

## 2012-05-13 MED ORDER — MUPIROCIN 2 % EX OINT: TOPICAL_OINTMENT | Freq: Once | CUTANEOUS | Status: DC

## 2012-05-13 MED ORDER — OXYCODONE HCL 5 MG PO TABS: 5.0000 mg | ORAL_TABLET | Freq: Once | ORAL | Status: DC | PRN

## 2012-05-13 MED ORDER — PROPOFOL 10 MG/ML IV EMUL
INTRAVENOUS | Status: DC | PRN
  Administered 2012-05-13 (×4): 50 mg via INTRAVENOUS
  Administered 2012-05-13: 200 mg via INTRAVENOUS

## 2012-05-13 MED ORDER — LACTATED RINGERS IV SOLN
INTRAVENOUS | Status: DC | PRN
  Administered 2012-05-13 (×3): via INTRAVENOUS

## 2012-05-13 MED ORDER — SODIUM CHLORIDE 0.9 % IV SOLN
INTRAVENOUS | Status: AC
  Filled 2012-05-13: qty 500

## 2012-05-13 MED ORDER — PNEUMOCOCCAL VAC POLYVALENT 25 MCG/0.5ML IJ INJ
0.5000 mL | INJECTION | INTRAMUSCULAR | Status: AC
  Administered 2012-05-14: 0.5 mL via INTRAMUSCULAR
  Filled 2012-05-13: qty 0.5

## 2012-05-13 MED ORDER — BUPIVACAINE HCL 0.5 % IJ SOLN
INTRAMUSCULAR | Status: DC | PRN
  Administered 2012-05-13: 49 mL

## 2012-05-13 MED ORDER — SODIUM CHLORIDE 0.9 % IV SOLN: 250.0000 mL | INTRAVENOUS | Status: DC

## 2012-05-13 MED ORDER — SIMVASTATIN 5 MG PO TABS
5.0000 mg | ORAL_TABLET | Freq: Every day | ORAL | Status: DC
  Administered 2012-05-13 – 2012-05-15 (×3): 5 mg via ORAL
  Filled 2012-05-13 (×5): qty 1

## 2012-05-13 MED ORDER — MENTHOL 3 MG MT LOZG
1.0000 | LOZENGE | OROMUCOSAL | Status: DC | PRN
  Administered 2012-05-13: 3 mg via ORAL
  Filled 2012-05-13: qty 9

## 2012-05-13 MED ORDER — GABAPENTIN 600 MG PO TABS
600.0000 mg | ORAL_TABLET | Freq: Every day | ORAL | Status: DC
  Administered 2012-05-13 – 2012-05-16 (×4): 600 mg via ORAL
  Filled 2012-05-13 (×6): qty 1

## 2012-05-13 MED ORDER — LISINOPRIL-HYDROCHLOROTHIAZIDE 20-12.5 MG PO TABS: 2.0000 | ORAL_TABLET | Freq: Every day | ORAL | Status: DC

## 2012-05-13 MED ORDER — LORAZEPAM 1 MG PO TABS: 1.0000 mg | ORAL_TABLET | Freq: Every evening | ORAL | Status: DC | PRN

## 2012-05-13 MED ORDER — HYDROMORPHONE HCL PF 1 MG/ML IJ SOLN
0.2500 mg | INTRAMUSCULAR | Status: DC | PRN
  Administered 2012-05-13 (×4): 0.5 mg via INTRAVENOUS

## 2012-05-13 MED ORDER — NEOSTIGMINE METHYLSULFATE 1 MG/ML IJ SOLN
INTRAMUSCULAR | Status: DC | PRN
  Administered 2012-05-13: 5 mg via INTRAVENOUS

## 2012-05-13 MED ORDER — ACETAMINOPHEN 650 MG RE SUPP: 650.0000 mg | RECTAL | Status: DC | PRN

## 2012-05-13 MED ORDER — SUCCINYLCHOLINE CHLORIDE 20 MG/ML IJ SOLN
INTRAMUSCULAR | Status: DC | PRN
  Administered 2012-05-13: 120 mg via INTRAVENOUS

## 2012-05-13 MED ORDER — ONDANSETRON HCL 4 MG/2ML IJ SOLN: 4.0000 mg | INTRAMUSCULAR | Status: DC | PRN

## 2012-05-13 MED ORDER — BACITRACIN 50000 UNITS IM SOLR
INTRAMUSCULAR | Status: AC
  Filled 2012-05-13: qty 1

## 2012-05-13 SURGICAL SUPPLY — 77 items
ADH SKN CLS APL DERMABOND .7 (GAUZE/BANDAGES/DRESSINGS)
ADH SKN CLS LQ APL DERMABOND (GAUZE/BANDAGES/DRESSINGS) ×1
APL SKNCLS STERI-STRIP NONHPOA (GAUZE/BANDAGES/DRESSINGS) ×1
BAG DECANTER FOR FLEXI CONT (MISCELLANEOUS) ×3 IMPLANT
BENZOIN TINCTURE PRP APPL 2/3 (GAUZE/BANDAGES/DRESSINGS) ×3 IMPLANT
BLADE SURG ROTATE 9660 (MISCELLANEOUS) IMPLANT
BONE EQUIVA 10CC (Bone Implant) ×1 IMPLANT
BONE MATRIX OSTEOCEL PLUS 10CC (Bone Implant) ×1 IMPLANT
BRUSH SCRUB EZ PLAIN DRY (MISCELLANEOUS) ×1 IMPLANT
CLOTH BEACON ORANGE TIMEOUT ST (SAFETY) ×4 IMPLANT
CONT SPEC 4OZ CLIKSEAL STRL BL (MISCELLANEOUS) ×2 IMPLANT
COROENT PLUS 8X18X45 (Cage) ×1 IMPLANT
COROENT PLUS 8X18X50 (Cage) ×1 IMPLANT
COVER BACK TABLE 24X17X13 BIG (DRAPES) IMPLANT
COVER TABLE BACK 60X90 (DRAPES) ×3 IMPLANT
DERMABOND ADHESIVE PROPEN (GAUZE/BANDAGES/DRESSINGS) ×1
DERMABOND ADVANCED (GAUZE/BANDAGES/DRESSINGS)
DERMABOND ADVANCED .7 DNX12 (GAUZE/BANDAGES/DRESSINGS) ×3 IMPLANT
DERMABOND ADVANCED .7 DNX6 (GAUZE/BANDAGES/DRESSINGS) IMPLANT
DRAPE C-ARM 42X72 X-RAY (DRAPES) ×4 IMPLANT
DRAPE C-ARMOR (DRAPES) ×4 IMPLANT
DRAPE LAPAROTOMY 100X72X124 (DRAPES) ×4 IMPLANT
DRAPE SURG 17X23 STRL (DRAPES) ×8 IMPLANT
DRESSING TELFA 8X3 (GAUZE/BANDAGES/DRESSINGS) ×4 IMPLANT
DURAPREP 26ML APPLICATOR (WOUND CARE) ×3 IMPLANT
ELECT BLADE 4.0 EZ CLEAN MEGAD (MISCELLANEOUS) ×2
ELECT REM PT RETURN 9FT ADLT (ELECTROSURGICAL) ×4
ELECTRODE BLDE 4.0 EZ CLN MEGD (MISCELLANEOUS) ×1 IMPLANT
ELECTRODE REM PT RTRN 9FT ADLT (ELECTROSURGICAL) ×2 IMPLANT
EVACUATOR 1/8 PVC DRAIN (DRAIN) ×1 IMPLANT
GAUZE SPONGE 4X4 16PLY XRAY LF (GAUZE/BANDAGES/DRESSINGS) ×1 IMPLANT
GLOVE BIO SURGEON STRL SZ8 (GLOVE) IMPLANT
GLOVE BIOGEL PI IND STRL 7.5 (GLOVE) IMPLANT
GLOVE BIOGEL PI IND STRL 8 (GLOVE) IMPLANT
GLOVE BIOGEL PI INDICATOR 7.5 (GLOVE) ×1
GLOVE BIOGEL PI INDICATOR 8 (GLOVE) ×3
GLOVE ECLIPSE 7.5 STRL STRAW (GLOVE) ×4 IMPLANT
GLOVE EXAM NITRILE LRG STRL (GLOVE) IMPLANT
GLOVE EXAM NITRILE XL STR (GLOVE) IMPLANT
GLOVE EXAM NITRILE XS STR PU (GLOVE) IMPLANT
GLOVE SURG SS PI 6.5 STRL IVOR (GLOVE) ×1 IMPLANT
GLOVE SURG SS PI 7.0 STRL IVOR (GLOVE) ×1 IMPLANT
GLOVE SURG SS PI 7.5 STRL IVOR (GLOVE) ×5 IMPLANT
GOWN BRE IMP SLV AUR LG STRL (GOWN DISPOSABLE) ×3 IMPLANT
GOWN BRE IMP SLV AUR XL STRL (GOWN DISPOSABLE) ×2 IMPLANT
GOWN STRL REIN 2XL LVL4 (GOWN DISPOSABLE) ×4 IMPLANT
K-WIRE NITHNOL TROCAR TIP (WIRE) ×6 IMPLANT
KIT BASIN OR (CUSTOM PROCEDURE TRAY) ×4 IMPLANT
KIT DILATOR XLIF 5 (KITS) IMPLANT
KIT MAXCESS (KITS) ×1 IMPLANT
KIT NDL NVM5 EMG ELECT (KITS) IMPLANT
KIT NEEDLE NVM5 EMG ELECT (KITS) ×1 IMPLANT
KIT NEEDLE NVM5 EMG ELECTRODE (KITS) ×1
KIT ROOM TURNOVER OR (KITS) ×3 IMPLANT
KIT XLIF (KITS) ×1
NEEDLE HYPO 22GX1.5 SAFETY (NEEDLE) ×4 IMPLANT
NEEDLE TARGETING (NEEDLE) ×7 IMPLANT
NS IRRIG 1000ML POUR BTL (IV SOLUTION) ×4 IMPLANT
PACK LAMINECTOMY NEURO (CUSTOM PROCEDURE TRAY) ×4 IMPLANT
ROD PREBENT PREC 70MM (Rod) ×2 IMPLANT
SCREW MIN INVASIVE 6.5X45 (Screw) ×6 IMPLANT
SPONGE GAUZE 4X4 12PLY (GAUZE/BANDAGES/DRESSINGS) ×3 IMPLANT
SPONGE LAP 4X18 X RAY DECT (DISPOSABLE) IMPLANT
SPONGE SURGIFOAM ABS GEL SZ50 (HEMOSTASIS) ×1 IMPLANT
STRIP CLOSURE SKIN 1/2X4 (GAUZE/BANDAGES/DRESSINGS) ×3 IMPLANT
SUT VIC AB 2-0 OS6 18 (SUTURE) ×15 IMPLANT
SUT VIC AB 3-0 CP2 18 (SUTURE) ×8 IMPLANT
SYR 20ML ECCENTRIC (SYRINGE) ×4 IMPLANT
TAPE CLOTH 3X10 TAN LF (GAUZE/BANDAGES/DRESSINGS) ×4 IMPLANT
TISSUE DILATOR C,RASIOLUCENT ×2 IMPLANT
TOP CLSR SEQUOIA (Orthopedic Implant) ×6 IMPLANT
TOWEL OR 17X24 6PK STRL BLUE (TOWEL DISPOSABLE) ×3 IMPLANT
TOWEL OR 17X26 10 PK STRL BLUE (TOWEL DISPOSABLE) ×4 IMPLANT
TRAP SPECIMEN MUCOUS 40CC (MISCELLANEOUS) IMPLANT
TRAY FOLEY BAG SILVER LF 14FR (CATHETERS) ×1 IMPLANT
TRAY FOLEY CATH 14FRSI W/METER (CATHETERS) ×1 IMPLANT
WATER STERILE IRR 1000ML POUR (IV SOLUTION) ×4 IMPLANT

## 2012-05-13 NOTE — Anesthesia Procedure Notes (Signed)
Procedure Name: Intubation Date/Time: 05/13/2012 7:54 AM Performed by: Sherie Don Pre-anesthesia Checklist: Patient identified, Emergency Drugs available, Suction available, Patient being monitored and Timeout performed Patient Re-evaluated:Patient Re-evaluated prior to inductionOxygen Delivery Method: Circle system utilized Preoxygenation: Pre-oxygenation with 100% oxygen Intubation Type: IV induction Laryngoscope Size: Miller and 2 Grade View: Grade II Tube type: Oral Tube size: 7.5 mm Number of attempts: 2 (prominent teeth narrow palate redunant tissue large epiglottis) Airway Equipment and Method: Stylet Placement Confirmation: ETT inserted through vocal cords under direct vision,  positive ETCO2 and breath sounds checked- equal and bilateral Secured at: 22 cm Tube secured with: Tape Dental Injury: Teeth and Oropharynx as per pre-operative assessment  Difficulty Due To: Difficulty was unanticipated

## 2012-05-13 NOTE — Op Note (Signed)
Preop diagnosis: Spondylolisthesis and spinal stenosis L3-4 L4-5 Postop diagnosis: Same Procedure: L3-4 L4-5 anterolateral fusion via left retroperitoneal approach with peek interbody spacer and neural monitoring followed by L3-4-5 segmental instrumentation with Pathfinder percutaneous pedicle screw system Surgeon: Lavone Weisel Assistant: Cabbell  After being placed in the left side up lateral decubitus position the patient's left flank was prepped and draped in the usual sterile fashion. Linear incision was made above the L4-5 disc space and carried down to the deep fascia and muscle. The incision was made more posterior to this incision to allow access safely into the retroperitoneum. We into the retroperitoneum then turned our finger upward swept tissue free and then allow access into the retroperitoneum from the more flank anterior incision. We passed our dilator down to the L4-5 disc without difficulty and docked into good position. We did some EMG testing which showed some abnormal readings so we relocated SL slightly more anterior. We then got good readings throughout and eventually Dr. retractor to the disc space with the shim after confirming good placement. We then opened the retractor in standard fashion tested once more confirming our position AP lateral fluoroscopy. We then coagulated on the annulus and incised a 15 blade. Using a 5 instruments we thoroughly cleaned the disc space out and release the annulus on the opposite side with the Cobb elevator. We then did sequential distraction until we had an 8 mm trial in place and felt that this was a good choice. We placed an 8 mm x 18 mm x 50 mm graft that was filled with a mixture of morselized allograft. We then irrigated copiously and placed the slight in the space. We then impacted the spacer without difficulty into good position. We then irrigated confirmed good placement with fluoroscopy and removed the retractor. We then made a linear incision over  the L3-4 disc and carried it down into the retroperitoneum safely once again by using the more posterior incision. Once again docked on the disc space at L3-4 and did sequential dilation and EMG testing. We then placed a retractor and secured to the table in standard fashion. We tested once more and then secured the retractor with the shim. We then incised the disc with a 15 blade and thoroughly cleaned out with pituitary rongeurs and curettes we used a variety of instruments to clean out the disc space and release the annulus on the opposite side. We then did sequential dilation of the disc space and eventually chose a standard 8 mm graft was 8 mm x 18 mm x 45 mm. Refill this with a mixture of morselized allograft. Once again impacted the spacer over the sides without difficulty and followed in excellent position. Then irrigated copiously metabolic irrigation removed the retractor. We irrigated all wounds and fluoroscopy showed good placement of the 2 spacers. We closed both lateral incisions with interrupted Vicryl on the fascia subcutaneous and subcuticular tissues and Dermabond on the skin. We placed a sterile dressing placed the patient the prone position. We placed percutaneous pedicle screws bilaterally at L3-L4 and L5 in standard fashion. We made small incisions and pass the Jamshidi needle from a lateral to medial direction and followed with AP lateral fluoroscopy. We placed guidewires the Jamshidi needle removed the Jamshidi needles from L3-L4 and L5 bilaterally. We then connected the incisions controlled any bleeding with unipolar coagulation. We started on the patient's right side we placed screws attached the Towers. We sequentially dilated over the guidewire used the pedicle all to break the  cortex tapped with the 6.0 mm tap and then placed 6.5 x 45 mm screws at L3-L4 and L5 on the right side. We then chose a 70 mm rod and passed through the Loris without difficulty and secured curettes to the top of  the screws with top loading nuts. We then did tightening and final tightening with torque and counter torque and then removed the B[place]our. Fossae showed good position of the instrumentation. We then did the same thing on the patient's left side once again breaking the cortex with the awl tapping with a 6.0 mm tap and placed 6.5 x 45 Miller screws at L3-L4 and L5 on the left side. We chose a similar length rod and then passed down the Clayton and secured at the top of the screws with top loading nuts. Once again did tightening and final tightening with torque and counter torque. We then removed the [facility] and final fluoroscopy in AP lateral direction showed good placement of the spacers screws and rods. Irrigation was carried out and these incisions were closed with inverted Vicryl on the subcutaneous and subcuticular tissues and Dermabond and Steri-Strips on the skin. Shortness applied the patient was extubated and taken to recovery in stable condition.

## 2012-05-13 NOTE — Anesthesia Postprocedure Evaluation (Signed)
Anesthesia Post Note  Patient: Sara [name]  Procedure(s) Performed: Procedure(s) (LRB): ANTERIOR LATERAL LUMBAR FUSION 2 LEVELS (Left) LUMBAR PERCUTANEOUS PEDICLE SCREW 2 LEVEL (Left)  Anesthesia type: general  Patient location: PACU  Post pain: Pain level controlled  Post assessment: Patient's Cardiovascular Status Stable  Last Vitals:  Filed Vitals:   05/13/12 1415  BP:   Pulse: 88  Temp:   Resp: 15    Post vital signs: Reviewed and stable  Level of consciousness: sedated  Complications: No apparent anesthesia complications

## 2012-05-13 NOTE — OR Nursing (Signed)
Neuro Nerve Monitoring per [facility] for Procedure 1 only.End Time for Procedure 1-10:25. Start time for Procedure 2-10:53.

## 2012-05-13 NOTE — Anesthesia Preprocedure Evaluation (Addendum)
Anesthesia Evaluation  Patient identified by MRN, date of birth, ID band Patient awake    Reviewed: Allergy & Precautions, H&P , NPO status , Patient's Chart, lab work & pertinent test results, reviewed documented beta blocker date and time   History of Anesthesia Complications Negative for: history of anesthetic complications  Airway Mallampati: II TM Distance: >3 FB Neck ROM: Full    Dental  (+) Teeth Intact and Dental Advisory Given   Pulmonary sleep apnea ,  breath sounds clear to auscultation  Pulmonary exam normal       Cardiovascular hypertension, Pt. on home beta blockers Rhythm:Regular Rate:Normal     Neuro/Psych  Headaches,    GI/Hepatic Neg liver ROS, hiatal hernia, PUD, GERD-  Medicated,  Endo/Other  Hypothyroidism Morbid obesity  Renal/GU negative Renal ROS     Musculoskeletal   Abdominal   Peds  Hematology   Anesthesia Other Findings   Reproductive/Obstetrics                          Anesthesia Physical Anesthesia Plan  ASA: III  Anesthesia Plan: General   Post-op Pain Management:    Induction: Intravenous  Airway Management Planned: Oral ETT  Additional Equipment:   Intra-op Plan:   Post-operative Plan: Extubation in OR  Informed Consent: I have reviewed the patients History and Physical, chart, labs and discussed the procedure including the risks, benefits and alternatives for the proposed anesthesia with the patient or authorized representative who has indicated his/her understanding and acceptance.   Dental advisory given and History available from chart only  Plan Discussed with:   Anesthesia Plan Comments:         Anesthesia Quick Evaluation

## 2012-05-13 NOTE — Transfer of Care (Signed)
Immediate Anesthesia Transfer of Care Note  Patient: Sara [name]  Procedure(s) Performed: Procedure(s) (LRB): ANTERIOR LATERAL LUMBAR FUSION 2 LEVELS (Left) LUMBAR PERCUTANEOUS PEDICLE SCREW 2 LEVEL (Left)  Patient Location: PACU  Anesthesia Type: General  Level of Consciousness: sedated and patient cooperative  Airway & Oxygen Therapy: Patient Spontanous Breathing and Patient connected to face mask oxygen  Post-op Assessment: Report given to PACU RN and Patient moving all extremities X 4  Post vital signs: Reviewed and stable  Complications: No apparent anesthesia complications

## 2012-05-13 NOTE — Preoperative (Signed)
Beta Blockers   Reason not to administer Beta Blockers:Beta blocker taken this am 

## 2012-05-13 NOTE — H&P (Signed)
Sara [name] is an 66 y.o. female.   Chief Complaint: Back and leg pain HPI: The patient is a 66 year old female who presented with back and bilateral leg pain. She has a history of remote back surgery and was tried on conservative therapy at this time without improvement. She underwent imaging studies which showed spondylolisthesis and stenosis at L3-4 and L4-5. After failing further conservative therapy the patient requested surgery and now comes for a two-level anterolateral fusion via the retroperitoneum. I had a long discussion with her regarding the risks and benefits of surgical intervention. The risks discussed include but are not limited to bleeding infection weakness numbness paralysis trouble with instrumentation nonunion coma and death. We have discussed alternative methods of therapy offered risks and benefits of nonintervention. She has had the opportunity to rest numerous questions and appears to understand. With this information in hand she has requested we proceed with surgery.  Past Medical History  Diagnosis Date  . Thyroid disease   . Chronic back pain   . Reflux   . Unspecified arthropathy, shoulder region   . Pure hypercholesterolemia     takes Pravastin daily  . Allergic rhinitis due to pollen   . Cervicalgia   . Diverticulosis of colon (without mention of hemorrhage)   . Morbid obesity   . Palpitations   . Peptic ulcer, unspecified site, unspecified as acute or chronic, without mention of hemorrhage, perforation, or obstruction   . Peripheral vertigo, unspecified     takes Meclizine prn  . Reflux esophagitis   . Spinal stenosis in cervical region   . 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 Personal history of tobacco use, presenting hazards to health   . Insomnia, unspecified   . Lumbago   . Right leg pain   . Shortness of breath   . H/O hiatal hernia   . HTN (hypertension)     takes  Metoprolol and Lisinopril daily  . Hypertension   . Sleep apnea     slight but doesn't require a CPAP  . History of bronchitis     > 56yr ago  . Seasonal allergies     uses Flonase daily  . Headache     denies migraines since the 80's but has occ and takes Butalbital prn  . Cervical spondylosis without myelopathy     all over  . Degeneration of cervical intervertebral disc   . Primary localized osteoarthrosis, lower leg   . GERD (gastroesophageal reflux disease)     takes Omeprazole daily  . Constipation   . Diverticulosis   . History of bladder infections   . Urinary incontinence     occasionally  . Other postablative hypothyroidism     takes SYnthroid daily  . Insomnia     takes Ativan nigtly    Past Surgical History  Procedure Date  . Knee surgery     arthroscopy-- R  . Tonsillectomy   . Appendectomy   . Colonoscopy Sept 2008    RMR: normal rectum, left-sided diverticula, repeat in 2018  . Abdominal hysterectomy   . Upper gastrointestinal endoscopy   . Breast lumpectomy     left   . Eye surgery     cataracts removed- bilateral, /w IOL  . Lumpectomy on pelvis   . Diagnostic laparoscopy   . Tubal ligation   . Esophagogastroduodenoscopy     Family History  Problem Relation Age of Onset  .  Colon cancer Neg Hx    Social History:  reports that she has quit smoking. Her smoking use included Cigarettes. She has a 27 pack-year smoking history. She quit smokeless tobacco use about 26 years ago. She reports that she drinks alcohol. She reports that she does not use illicit drugs.  Allergies:  Allergies  Allergen Reactions  . Darvon Other (See Comments)    Hallucinations   . Propoxyphene Hcl Other (See Comments)     Hallucinations  . Other Other (See Comments)    Breaks out /tomatoes  . Latex Dermatitis and Rash    Medications Prior to Admission  Medication Sig Dispense Refill  . Butalbital-APAP-Caffeine 50-300-40 MG CAPS Take 1 tablet by mouth every 6 (six)  hours as needed. For severe headache      . cyclobenzaprine (FLEXERIL) 10 MG tablet Take 10 mg by mouth 2 (two) times daily as needed. For muscle spasms      . fluticasone (FLONASE) 50 MCG/ACT nasal spray Place 2 sprays into the nose at bedtime.       . gabapentin (NEURONTIN) 600 MG tablet Take 600 mg by mouth at bedtime.       Marland Kitchen HYDROmorphone (DILAUDID) 4 MG tablet Take 4 mg by mouth every 4 (four) hours as needed. For pain      . levothyroxine (SYNTHROID, LEVOTHROID) 88 MCG tablet Take 88 mcg by mouth daily before breakfast.       . lisinopril-hydrochlorothiazide (PRINZIDE,ZESTORETIC) 20-12.5 MG per tablet Take 2 tablets by mouth daily with breakfast.       . LORazepam (ATIVAN) 1 MG tablet Take 1 mg by mouth at bedtime as needed. For sleep      . meclizine (ANTIVERT) 25 MG tablet Take 25 mg by mouth 4 (four) times daily as needed. For nausea      . metoprolol tartrate (LOPRESSOR) 25 MG tablet Take 25 mg by mouth 2 (two) times daily.       . naproxen (NAPROSYN) 500 MG tablet Take 500 mg by mouth 2 (two) times daily with a meal.       . omeprazole (PRILOSEC) 40 MG capsule Take 40 mg by mouth daily before breakfast.       . pravastatin (PRAVACHOL) 40 MG tablet Take 40 mg by mouth daily with breakfast.         Results for orders placed during the hospital encounter of 05/13/12 (from the past 48 hour(s))  BASIC METABOLIC PANEL     Status: Abnormal   Collection Time   05/13/12  5:43 AM      Component Value Range Comment   Sodium 138  135 - 145 mEq/L    Potassium 3.5  3.5 - 5.1 mEq/L    Chloride 100  96 - 112 mEq/L    CO2 26  19 - 32 mEq/L    Glucose, Bld 109 (*) 70 - 99 mg/dL    BUN 7  6 - 23 mg/dL    Creatinine, Ser 1.61  0.50 - 1.10 mg/dL    Calcium 9.3  8.4 - 09.6 mg/dL    GFR calc non Af Amer 71 (*) >90 mL/min    GFR calc Af Amer 83 (*) >90 mL/min   CBC     Status: Normal   Collection Time   05/13/12  5:43 AM      Component Value Range Comment   WBC 8.2  4.0 - 10.5 K/uL    RBC 4.42   3.87 - 5.11 MIL/uL  Hemoglobin 13.8  12.0 - 15.0 g/dL    HCT 62.1  30.8 - 65.7 %    MCV 92.1  78.0 - 100.0 fL    MCH 31.2  26.0 - 34.0 pg    MCHC 33.9  30.0 - 36.0 g/dL    RDW 84.6  96.2 - 95.2 %    Platelets 273  150 - 400 K/uL   TYPE AND SCREEN     Status: Normal   Collection Time   05/13/12  5:43 AM      Component Value Range Comment   ABO/RH(D) B POS      Antibody Screen NEG      Sample Expiration 05/16/2012      No results found.  A comprehensive review of systems was negative.  Blood pressure 114/76, pulse 85, temperature 97.8 F (36.6 C), temperature source Oral, resp. rate 16, height 5\' 5"  (1.651 m), weight 113.399 kg (250 lb), SpO2 94.00%.  The patient is awake alert and oriented. She is no facial asymmetry. Her gait is nonantalgic. Reflexes are decreased but equal. Strength is intact. Assessment/Plan Impression is that of stenosis and listhesis at L3-4 and L4-5. The plan is for a two-level anterolateral fusion with pedicle screw fixation.  Reinaldo Meeker, MD 05/13/2012, 7:40 AM

## 2012-05-13 NOTE — Progress Notes (Signed)
UR COMPLETED  

## 2012-05-14 MED ORDER — DOCUSATE SODIUM 100 MG PO CAPS
100.0000 mg | ORAL_CAPSULE | Freq: Two times a day (BID) | ORAL | Status: DC
  Administered 2012-05-14 – 2012-05-17 (×6): 100 mg via ORAL
  Filled 2012-05-14 (×6): qty 1

## 2012-05-14 MED ORDER — SENNOSIDES-DOCUSATE SODIUM 8.6-50 MG PO TABS
2.0000 | ORAL_TABLET | Freq: Every evening | ORAL | Status: DC | PRN
  Administered 2012-05-14 – 2012-05-16 (×2): 2 via ORAL
  Administered 2012-05-17: 1 via ORAL
  Filled 2012-05-14: qty 1
  Filled 2012-05-14 (×2): qty 2

## 2012-05-14 NOTE — Progress Notes (Signed)
Occupational Therapy Evaluation Patient Details Name: Sara [name] MRN: [id number] DOB: 12/05/1945 Today's Date: 05/14/2012 Time: 6962-9528 OT Time Calculation (min): 28 min  OT Assessment / Plan / Recommendation Clinical Impression  Pt s/p ALF L3-5 thus affecting PLOF. Will benefit from acute OT services to address below problem list in prep for d/c home with husband,    OT Assessment  Patient needs continued OT Services    Follow Up Recommendations  Home health OT;Supervision/Assistance - 24 hour    Barriers to Discharge      Equipment Recommendations  Rolling walker with 5" wheels;3 in 1 bedside comode    Recommendations for Other Services    Frequency  Min 2X/week    Precautions / Restrictions     Pertinent Vitals/Pain See vitals    ADL  Lower Body Dressing: Performed;+1 Total assistance Where Assessed - Lower Body Dressing: Unsupported sitting Toilet Transfer: Simulated;+2 Total assistance Toilet Transfer: Patient Percentage: 50% Toilet Transfer Method: Sit to stand Toilet Transfer Equipment:  (bed) Equipment Used: Back brace;Gait belt;Rolling walker Transfers/Ambulation Related to ADLs: mod assist with RW for ambulation in room ADL Comments: ADL assessment limited due to pt pain and fatigue. Pt donned back brace with mod assist while sitting EOB.    OT Diagnosis: Generalized weakness;Acute pain  OT Problem List: Decreased activity tolerance;Decreased knowledge of use of DME or AE;Decreased knowledge of precautions;Pain;Decreased strength OT Treatment Interventions: Self-care/ADL training;DME and/or AE instruction;Therapeutic activities;Patient/family education   OT Goals Acute Rehab OT Goals OT Goal Formulation: With patient Time For Goal Achievement: 05/21/12 Potential to Achieve Goals: Good ADL Goals Pt Will Perform Lower Body Bathing: with supervision;Sit to stand from chair;Sit to stand from bed;with adaptive equipment ADL Goal: Lower Body Bathing -  Progress: Goal set today Pt Will Perform Lower Body Dressing: with supervision;Sit to stand from chair;Sit to stand from bed;with adaptive equipment ADL Goal: Lower Body Dressing - Progress: Goal set today Pt Will Transfer to Toilet: with supervision;Ambulation;with DME;Comfort height toilet;Maintaining back safety precautions ADL Goal: Toilet Transfer - Progress: Goal set today Pt Will Perform Toileting - Clothing Manipulation: with supervision;Standing ADL Goal: Toileting - Clothing Manipulation - Progress: Goal set today Pt Will Perform Toileting - Hygiene: with supervision;Standing at 3-in-1/toilet;Sit to stand from 3-in-1/toilet ADL Goal: Toileting - Hygiene - Progress: Goal set today Pt Will Perform Tub/Shower Transfer: Tub transfer;Shower seat with back;Ambulation;with DME;with supervision;Maintaining back safety precautions ADL Goal: Tub/Shower Transfer - Progress: Goal set today Additional ADL Goal #1: Pt will independently verbalize and generalize 3/3 back precautions during all ADL activity. ADL Goal: Additional Goal #1 - Progress: Goal set today Miscellaneous OT Goals Miscellaneous OT Goal #1: Pt will perform bed mobility with supervision in prep for EOB ADLs. OT Goal: Miscellaneous Goal #1 - Progress: Goal set today  Visit Information  Last OT Received On: 05/14/12 PT/OT Co-Evaluation/Treatment: Yes    Subjective Data      Prior Functioning  Vision/Perception         Cognition       Extremity/Trunk Assessment Right Upper Extremity Assessment RUE ROM/Strength/Tone: Within functional levels Left Upper Extremity Assessment LUE ROM/Strength/Tone: Within functional levels   Mobility     Exercise    Balance    End of Session OT - End of Session Equipment Utilized During Treatment: Gait belt;Back brace Activity Tolerance: Patient limited by fatigue;Patient limited by pain Patient left: in bed;with call bell/phone within reach Nurse Communication: Mobility status   GO   05/14/2012 Cipriano Mile OTR/L Pager  [phone] Office [phone]   Cipriano Mile 05/14/2012, 1:07 PM

## 2012-05-14 NOTE — Progress Notes (Signed)
Subjective: Patient reports She's feeling well no leg pain a lot of back soreness  Objective: Vital signs in last 24 hours: Temp:  [97 F (36.1 C)-98 F (36.7 C)] 97.3 F (36.3 C) (08/24 0611) Pulse Rate:  [79-104] 104  (08/24 0611) Resp:  [11-20] 20  (08/24 0611) BP: (89-128)/(50-74) 128/72 mmHg (08/24 0611) SpO2:  [93 %-100 %] 99 % (08/24 0611)  Intake/Output from previous day: 08/23 0701 - 08/24 0700 In: 3243 [P.O.:240; I.V.:2503; IV Piggyback:500] Out: 950 [Urine:850; Blood:100] Intake/Output this shift:    Strength out of 5 wound clean and dry  Lab Results:  [facility] 05/13/12 0543  WBC 8.2  HGB 13.8  HCT 40.7  PLT 273   BMET  Basename 05/13/12 0543  NA 138  K 3.5  CL 100  CO2 26  GLUCOSE 109*  BUN 7  CREATININE 0.84  CALCIUM 9.3    Studies/Results: Dg Lumbar Spine 2-3 Views  05/13/2012  *RADIOLOGY REPORT*  Clinical Data: L3-L5 XLIF.  DG C-ARM GT 120 MIN,LUMBAR SPINE - 2-3 VIEW  Comparison: Radiographs 12/31/2011.  Findings: PA and lateral spot fluoroscopic images of the lower lumbar spine demonstrate interval lumbar fusion from L3-L5. Bilateral pedicle screws, interconnecting rods and interbody spacers appear well positioned.  No complications are identified.  IMPRESSION: Intraoperative views following L3-L5 XLIF.   Original Report Authenticated By: Gerrianne Scale, M.D.    Dg C-arm Gt 120 Min  05/13/2012  *RADIOLOGY REPORT*  Clinical Data: L3-L5 XLIF.  DG C-ARM GT 120 MIN,LUMBAR SPINE - 2-3 VIEW  Comparison: Radiographs 12/31/2011.  Findings: PA and lateral spot fluoroscopic images of the lower lumbar spine demonstrate interval lumbar fusion from L3-L5. Bilateral pedicle screws, interconnecting rods and interbody spacers appear well positioned.  No complications are identified.  IMPRESSION: Intraoperative views following L3-L5 XLIF.   Original Report Authenticated By: Gerrianne Scale, M.D.     Assessment/Plan: Progressive mobilization today work on pain  control possible discharge tomorrow next a  LOS: 1 day     Sara [name] P 05/14/2012, 8:29 AM

## 2012-05-14 NOTE — Evaluation (Signed)
Physical Therapy Evaluation Patient Details Name: Sara [name] MRN: [id number] DOB: 1945-09-25 Today's Date: 05/14/2012 Time: 8657-8469 PT Time Calculation (min): 28 min  PT Assessment / Plan / Recommendation Clinical Impression  Pt s/p ALF L3-5. Pt with decreased functional mobility and endurance. Pt will benefit from skilled PT in the acute care setting in order to maximize functional mobility and safety prior to d/c home with husband    PT Assessment  Patient needs continued PT services    Follow Up Recommendations  Home health PT;Supervision/Assistance - 24 hour    Barriers to Discharge        Equipment Recommendations  Rolling walker with 5" wheels    Recommendations for Other Services     Frequency Min 5X/week    Precautions / Restrictions Precautions Precautions: Back Precaution Booklet Issued: Yes (comment) Precaution Comments: pt educated on 3/3 back precautions Required Braces or Orthoses: Spinal Brace Spinal Brace: Lumbar corset;Applied in sitting position Restrictions Weight Bearing Restrictions: No   Pertinent Vitals/Pain Pain 8/10      Mobility  Bed Mobility Bed Mobility: Rolling Left;Left Sidelying to Sit;Sitting - Scoot to Edge of Bed Rolling Left: 3: Mod assist Left Sidelying to Sit: 3: Mod assist Sitting - Scoot to Edge of Bed: 4: Min assist Sit to Sidelying Right: 3: Mod assist Details for Bed Mobility Assistance: VC for proper sequencing and technique to maintain back precautions during transfer.  Transfers Transfers: Sit to Stand;Stand to Sit Sit to Stand: 1: +2 Total assist;With upper extremity assist;From bed Sit to Stand: Patient Percentage: 50% Stand to Sit: 1: +2 Total assist;With upper extremity assist;To chair/3-in-1 Stand to Sit: Patient Percentage: 50% Details for Transfer Assistance: VC for hand placement. Assist for stability as well as to increase anterior shift. Ambulation/Gait Ambulation/Gait Assistance: 3: Mod  assist Ambulation Distance (Feet): 20 Feet Assistive device: Rolling walker Ambulation/Gait Assistance Details: VC for proper sequencing and safety with RW. Pt with small slow steps requiring increased assistance for weight shifting. Gait Pattern: Step-to pattern;Wide base of support;Decreased stride length;Decreased hip/knee flexion - right;Decreased hip/knee flexion - left Gait velocity: decreased gait speed    Exercises     PT Diagnosis: Difficulty walking;Acute pain  PT Problem List: Decreased activity tolerance;Decreased balance;Decreased mobility;Decreased knowledge of use of DME;Decreased safety awareness;Decreased knowledge of precautions;Pain PT Treatment Interventions: DME instruction;Gait training;Stair training;Functional mobility training;Therapeutic activities;Patient/family education   PT Goals Acute Rehab PT Goals PT Goal Formulation: With patient Time For Goal Achievement: 05/21/12 Potential to Achieve Goals: Good Pt will Roll Supine to Right Side: with supervision PT Goal: Rolling Supine to Right Side - Progress: Goal set today Pt will Roll Supine to Left Side: with supervision PT Goal: Rolling Supine to Left Side - Progress: Goal set today Pt will go Supine/Side to Sit: with supervision PT Goal: Supine/Side to Sit - Progress: Goal set today Pt will go Sit to Stand: with supervision PT Goal: Sit to Stand - Progress: Goal set today Pt will go Stand to Sit: with min assist PT Goal: Stand to Sit - Progress: Goal set today Pt will Transfer Bed to Chair/Chair to Bed: with min assist PT Transfer Goal: Bed to Chair/Chair to Bed - Progress: Goal set today Pt will Ambulate: 51 - 150 feet;with min assist;with rolling walker PT Goal: Ambulate - Progress: Goal set today Pt will Go Up / Down Stairs: 1-2 stairs;with min assist PT Goal: Up/Down Stairs - Progress: Goal set today  Visit Information  Last PT Received On: 05/14/12 Assistance Needed: +  2 PT/OT  Co-Evaluation/Treatment: Yes    Subjective Data  Patient Stated Goal: to go home with husband   Prior Functioning  Home Living Lives With: Spouse Available Help at Discharge: Family;Available 24 hours/day Type of Home: House Home Access: Stairs to enter Entrance Stairs-Rails: None Home Layout: One level Bathroom Shower/Tub: Forensic scientist: Standard Bathroom Accessibility: Yes How Accessible: Accessible via walker Home Adaptive Equipment: Straight cane Prior Function Level of Independence: Needs assistance Needs Assistance: Light Housekeeping;Meal Prep Meal Prep: Total Light Housekeeping: Total Able to Take Stairs?: Yes Driving: No Vocation: Retired 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 Dominant Hand: Right    Cognition  Overall Cognitive Status: Appears within functional limits for tasks assessed/performed Arousal/Alertness: Awake/alert Orientation Level: Appears intact for tasks assessed Behavior During Session: [facility] for tasks performed    Extremity/Trunk Assessment Right Lower Extremity Assessment RLE ROM/Strength/Tone: Within functional levels RLE Sensation: WFL - Light Touch Left Lower Extremity Assessment LLE ROM/Strength/Tone: Within functional levels LLE Sensation: WFL - Light Touch   Balance    End of Session PT - End of Session Equipment Utilized During Treatment: Gait belt;Back brace Activity Tolerance: Patient limited by fatigue;Patient limited by pain Patient left: in bed;with call bell/phone within reach;with family/visitor present Nurse Communication: Mobility status    Milana Kidney 05/14/2012, 12:44 PM  05/14/2012 Milana Kidney DPT PAGER: [phone] OFFICE: [phone]

## 2012-05-15 MED ORDER — NYSTATIN 100000 UNIT/ML MT SUSP
5.0000 mL | Freq: Four times a day (QID) | OROMUCOSAL | Status: DC
  Administered 2012-05-15 – 2012-05-17 (×7): 500000 [IU] via ORAL
  Filled 2012-05-15 (×11): qty 5

## 2012-05-15 MED ORDER — HYDROMORPHONE HCL 2 MG PO TABS
4.0000 mg | ORAL_TABLET | ORAL | Status: DC | PRN
  Administered 2012-05-15: 2 mg via ORAL
  Administered 2012-05-15 – 2012-05-17 (×6): 4 mg via ORAL
  Filled 2012-05-15 (×6): qty 2

## 2012-05-15 MED ORDER — HYDROMORPHONE HCL 2 MG PO TABS
2.0000 mg | ORAL_TABLET | ORAL | Status: DC | PRN
  Administered 2012-05-15 (×2): 2 mg via ORAL
  Filled 2012-05-15 (×3): qty 1

## 2012-05-15 NOTE — Progress Notes (Signed)
Patient ID: Sara [name], female   DOB: May 10, 1946, 66 y.o.   MRN: [id number] BP 128/68  Pulse 99  Temp 98.5 F (36.9 C) (Oral)  Resp 18  Ht 5\' 5"  (1.651 m)  Wt 113.399 kg (250 lb)  BMI 41.60 kg/m2  SpO2 95% Alert and oriented x 4 Moving lower extremities well, ~5/5 Dressing removed, blistering on flank with open areas -will cover with myoplex dressing No signs of infection on wounds. Continue with pt, has been up today. Switched to oral dilaudid, nystatin for possible thrush.

## 2012-05-15 NOTE — Progress Notes (Signed)
Physical Therapy Treatment Patient Details Name: Sara [name] MRN: [id number] DOB: 03-Dec-1945 Today's Date: 05/15/2012 Time: 1191-4782 PT Time Calculation (min): 24 min  PT Assessment / Plan / Recommendation Comments on Treatment Session  Pt progressing well, improvements in sit to/from stand although pt still limited with bed mobility. Educated pt and husband numerous times throughout session on proper and safe technique to maintain back precautions. Encouraged pt to complete as much mobility as she is able to and only using husband when needed. Continue per plan, emphasizing bed mobility without rail prior to d/c home    Follow Up Recommendations  Home health PT;Supervision/Assistance - 24 hour    Barriers to Discharge        Equipment Recommendations  Rolling walker with 5" wheels;3 in 1 bedside comode    Recommendations for Other Services    Frequency Min 5X/week   Plan Discharge plan remains appropriate;Frequency remains appropriate    Precautions / Restrictions Precautions Precautions: Back Precaution Comments: pt able to verbalize 2/3 back precautions. Cueing throughout session to avoid twisting during bed mobility. Required Braces or Orthoses: Spinal Brace Spinal Brace: Lumbar corset;Applied in sitting position Restrictions Weight Bearing Restrictions: No   Pertinent Vitals/Pain Pt with increased pain during bed mobility. Pain 8/10    Mobility  Bed Mobility Bed Mobility: Rolling Left;Left Sidelying to Sit;Sitting - Scoot to Edge of Bed;Sit to Sidelying Left Rolling Left: 3: Mod assist Left Sidelying to Sit: 3: Mod assist Sitting - Scoot to Edge of Bed: 4: Min assist Sit to Sidelying Left: 3: Mod assist Details for Bed Mobility Assistance: VC for sequencing to maintain precautions. Completed in multiple steps for safety. Pts husband assisting with bed mobility, attempted without using rails, although pt required for comfort. Educated pt and husband on safe technique  with pt completing most of the transfer Transfers Transfers: Sit to Stand;Stand to Sit Sit to Stand: 1: +2 Total assist;With upper extremity assist;From bed;3: Mod assist Sit to Stand: Patient Percentage: 70% Stand to Sit: 3: Mod assist Details for Transfer Assistance: VC for hand placement for safe stand to RW. Completed two stands, pt improving with only min-mod assist for the second stand.  Ambulation/Gait Ambulation/Gait Assistance: 5: Supervision Ambulation Distance (Feet): 125 Feet Assistive device: Rolling walker Ambulation/Gait Assistance Details: VC for safe distance to RW as well as postural cues for upright. Gait Pattern: Step-to pattern;Wide base of support;Decreased stride length;Decreased hip/knee flexion - right;Decreased hip/knee flexion - left Gait velocity: decreased gait speed    Exercises     PT Diagnosis:    PT Problem List:   PT Treatment Interventions:     PT Goals Acute Rehab PT Goals PT Goal: Rolling Supine to Right Side - Progress: Progressing toward goal PT Goal: Rolling Supine to Left Side - Progress: Progressing toward goal PT Goal: Supine/Side to Sit - Progress: Progressing toward goal PT Goal: Sit to Stand - Progress: Progressing toward goal PT Goal: Stand to Sit - Progress: Progressing toward goal PT Transfer Goal: Bed to Chair/Chair to Bed - Progress: Progressing toward goal PT Goal: Ambulate - Progress: Progressing toward goal  Visit Information  Last PT Received On: 05/15/12 Assistance Needed: +2    Subjective Data      Cognition  Overall Cognitive Status: Appears within functional limits for tasks assessed/performed Arousal/Alertness: Awake/alert Orientation Level: Appears intact for tasks assessed Behavior During Session: [facility] for tasks performed    Balance     End of Session PT - End of Session Equipment Utilized  During Treatment: Gait belt;Back brace Activity Tolerance: Patient tolerated treatment well Patient left: in bed;with  call bell/phone within reach;with family/visitor present Nurse Communication: Mobility status   GP     Milana Kidney 05/15/2012, 3:56 PM

## 2012-05-16 NOTE — Progress Notes (Signed)
Patient ID: Sara [name], female   DOB: 05-17-1946, 66 y.o.   MRN: [id number] Subjective: Patient reports incisional pain  Objective: Vital signs in last 24 hours: Temp:  [98.2 F (36.8 C)-98.5 F (36.9 C)] 98.5 F (36.9 C) (08/26 0720) Pulse Rate:  [92-101] 100  (08/26 0720) Resp:  [17-18] 18  (08/26 0720) BP: (116-128)/(60-75) 116/65 mmHg (08/26 0720) SpO2:  [93 %-98 %] 93 % (08/26 0720)  Intake/Output from previous day:   Intake/Output this shift:    Wound:clean and dry. Neuro intact and stable   Lab Results: No results found for this basename: WBC:2,HGB:2,HCT:2,PLT:2 in the last 72 hours BMET No results found for this basename: NA:2,K:2,CL:2,CO2:2,GLUCOSE:2,BUN:2,CREATININE:2,CALCIUM:2 in the last 72 hours  Studies/Results: No results found.  Assessment/Plan: Doing fairly well. Feels she needs another day in hospital. Will see how she is doing tomorrow.  LOS: 3 days  as above   Reinaldo Meeker, MD 05/16/2012, 9:35 AM

## 2012-05-16 NOTE — Progress Notes (Signed)
Physical Therapy Treatment Patient Details Name: Sara [name] MRN: [id number] DOB: 1946-01-04 Today's Date: 05/16/2012 Time: 4098-1191 PT Time Calculation (min): 24 min  PT Assessment / Plan / Recommendation Comments on Treatment Session  Patient motivated and progressing werll with ambulation. Patient has great support from her husband and should be on track to DC home soon.    Follow Up Recommendations  Home health PT;Supervision/Assistance - 24 hour    Barriers to Discharge        Equipment Recommendations  Rolling walker with 5" wheels;3 in 1 bedside comode    Recommendations for Other Services    Frequency Min 5X/week   Plan Discharge plan remains appropriate;Frequency remains appropriate    Precautions / Restrictions Precautions Precautions: Back Precaution Comments: Patient able to verbalize 3/3 back precautions. Able to avoided twisting at back this session  Required Braces or Orthoses: Spinal Brace Spinal Brace: Lumbar corset;Applied in sitting position   Pertinent Vitals/Pain     Mobility  Bed Mobility Rolling Left: 4: Min assist;With rail Left Sidelying to Sit: 4: Min assist;With rails Sitting - Scoot to Edge of Bed: 4: Min guard Details for Bed Mobility Assistance: Cues for sequency to maintain precautions. A with shoulders and LEs out of bed Transfers Sit to Stand: 1: +2 Total assist;With upper extremity assist;From bed Sit to Stand: Patient Percentage: 80% Stand to Sit: 4: Min assist;With upper extremity assist;To chair/3-in-1;With armrests Details for Transfer Assistance: VCs for hand placement and safe technique. A until legs extend fully Ambulation/Gait Ambulation/Gait Assistance: 5: Supervision Ambulation Distance (Feet): 200 Feet Assistive device: Rolling walker Ambulation/Gait Assistance Details: Patient required no cues for posture this session Gait Pattern: Step-through pattern;Decreased stride length Stairs: Yes Stairs Assistance: 4: Min  guard Stair Management Technique: No rails;Step to pattern;Forwards;With walker Number of Stairs: 1     Exercises     PT Diagnosis:    PT Problem List:   PT Treatment Interventions:     PT Goals Acute Rehab PT Goals PT Goal: Rolling Supine to Left Side - Progress: Progressing toward goal PT Goal: Supine/Side to Sit - Progress: Progressing toward goal PT Goal: Sit to Stand - Progress: Progressing toward goal PT Goal: Stand to Sit - Progress: Progressing toward goal PT Transfer Goal: Bed to Chair/Chair to Bed - Progress: Progressing toward goal PT Goal: Ambulate - Progress: Progressing toward goal PT Goal: Up/Down Stairs - Progress: Met  Visit Information  Last PT Received On: 05/16/12 Assistance Needed: +2    Subjective Data      Cognition  Overall Cognitive Status: Appears within functional limits for tasks assessed/performed Arousal/Alertness: Awake/alert Orientation Level: Appears intact for tasks assessed Behavior During Session: [facility] for tasks performed    Balance     End of Session PT - End of Session Equipment Utilized During Treatment: Gait belt;Back brace Activity Tolerance: Patient tolerated treatment well Patient left: in chair;with call bell/phone within reach Nurse Communication: Mobility status   GP     Fredrich Birks 05/16/2012, 1:51 PM 05/16/2012 Fredrich Birks PTA [phone] pager [phone] office

## 2012-05-16 NOTE — Progress Notes (Signed)
Occupational Therapy Treatment Patient Details Name: Sara [name] MRN: [id number] DOB: 12/16/1945 Today's Date: 05/16/2012 Time: 1191-4782 OT Time Calculation (min): 61 min  OT Assessment / Plan / Recommendation Comments on Treatment Session Pt. and spouse were instructed in use of AE for LB ADLs.  Husband is supportive and is available to assist pt. as needed.  Pt. requires increased time to move sit to stand, and mod A for simulated tub transfer.      Follow Up Recommendations  Home health OT;Supervision/Assistance - 24 hour    Barriers to Discharge       Equipment Recommendations  Rolling walker with 5" wheels;3 in 1 bedside comode    Recommendations for Other Services    Frequency Min 2X/week   Plan Discharge plan remains appropriate    Precautions / Restrictions Precautions Precautions: Back Precaution Booklet Issued: Yes (comment) Precaution Comments: Patient able to verbalize 3/3 back precautions. Able to avoided twisting at back this session  Required Braces or Orthoses: Spinal Brace Spinal Brace: Lumbar corset;Applied in sitting position Restrictions Weight Bearing Restrictions: No   Pertinent Vitals/Pain     ADL  Lower Body Dressing: Performed;Minimal assistance (using AE) Where Assessed - Lower Body Dressing: Supported sit to stand Toilet Transfer: Mining engineer Method: Sit to stand;Stand pivot Tub/Shower Transfer: Simulated;Moderate assistance (simulated stepping over the tub) Tub/Shower Transfer Method:  (stepping with walker) Tub/Shower Transfer Equipment: Grab bars Equipment Used: Back brace;Rolling walker Transfers/Ambulation Related to ADLs: min A for sit to stand and short distance ambulation to bed - requires increased time ADL Comments: Pt. unable to access feet for LB ADLs.  Pt. and husband were instructed in use and acquisition of AE.   Discussed and reinforced back precautions.  Pt's husband supportive and reports  he will assist pt as needed at discharge.      OT Diagnosis:    OT Problem List:   OT Treatment Interventions:     OT Goals ADL Goals ADL Goal: Lower Body Bathing - Progress: Progressing toward goals ADL Goal: Lower Body Dressing - Progress: Progressing toward goals ADL Goal: Toilet Transfer - Progress: Progressing toward goals ADL Goal: Tub/Shower Transfer - Progress: Progressing toward goals ADL Goal: Additional Goal #1 - Progress: Progressing toward goals Miscellaneous OT Goals OT Goal: Miscellaneous Goal #1 - Progress: Progressing toward goals  Visit Information  Last OT Received On: 05/16/12 Assistance Needed: +1    Subjective Data      Prior Functioning       Cognition  Overall Cognitive Status: Appears within functional limits for tasks assessed/performed Arousal/Alertness: Awake/alert Orientation Level: Appears intact for tasks assessed Behavior During Session: [facility] for tasks performed    Mobility Bed Mobility Bed Mobility: Sit to Sidelying Left Rolling Left: 4: Min assist;With rail Left Sidelying to Sit: 4: Min assist;With rails Sitting - Scoot to [place] of Bed: 4: Min guard Sit to Sidelying Left: 3: Mod assist Details for Bed Mobility Assistance: Step by step instruction, and assist for LEs Transfers Transfers: Sit to Stand;Stand to Sit Sit to Stand: 4: Min assist;From chair/3-in-1;With upper extremity assist (with increased time) Sit to Stand: Patient Percentage: 80% Stand to Sit: 4: Min assist;With upper extremity assist;To chair/3-in-1;With armrests Details for Transfer Assistance: increased time   Exercises    Balance    End of Session OT - End of Session Equipment Utilized During Treatment: Back brace Activity Tolerance: Patient tolerated treatment well Patient left: in bed;with call bell/phone within reach;with family/visitor present  GO  Jeani Hawking M 05/16/2012, 2:52 PM

## 2012-05-17 ENCOUNTER — Encounter (HOSPITAL_COMMUNITY): Payer: Self-pay | Admitting: Neurosurgery

## 2012-05-17 MED ORDER — CYCLOBENZAPRINE HCL 10 MG PO TABS: 10.0000 mg | ORAL_TABLET | Freq: Three times a day (TID) | ORAL | Status: AC | PRN

## 2012-05-17 MED ORDER — HYDROMORPHONE HCL 4 MG PO TABS: 4.0000 mg | ORAL_TABLET | ORAL | Status: AC | PRN

## 2012-05-17 MED ORDER — BISACODYL 5 MG PO TBEC
10.0000 mg | DELAYED_RELEASE_TABLET | Freq: Every day | ORAL | Status: DC | PRN
  Administered 2012-05-17: 10 mg via ORAL
  Filled 2012-05-17: qty 2

## 2012-05-17 NOTE — Care Management Note (Signed)
    Page 1 of 1   05/17/2012     2:18:47 PM   CARE MANAGEMENT NOTE 05/17/2012  Patient:  Sara [name], Sara [name]   Account Number:  [id number]  Date Initiated:  05/17/2012  Documentation initiated by:  [facility]  Subjective/Objective Assessment:   Admitted postop L4-5 fusion with perc screws.     Action/Plan:   PT/OT evals- recommending HHPT and HHOT, rolling walker, 3 in 1   Anticipated DC Date:  05/17/2012   Anticipated DC Plan:  HOME/SELF CARE      DC Planning Services  CM consult      Choice offered to / List presented to:             Status of service:  Completed, signed off Medicare Important Message given?   (If response is "NO", the following Medicare IM given date fields will be blank) Date Medicare IM given:   Date Additional Medicare IM given:    Discharge Disposition:  HOME/SELF CARE  Per UR Regulation:  Reviewed for med. necessity/level of care/duration of stay  If discussed at Long Length of Stay Meetings, dates discussed:    Comments:  05/17/12 Spoke with Dr. Gerlene Fee regarding PT/OT recomending HHPT and HHOT, he did not think that patient requires [facility] therapies. He ordered rolling walker and 3 in 1. Contacted [facility] and requested rolling walker and 3 in 1, for delivery to room prior to d/c today. Patient states that her spouse will be available for supervisoin, assistance 24/7. Jacquelynn Cree RN, BSN, CCM

## 2012-05-17 NOTE — Progress Notes (Signed)
Occupational Therapy Treatment Patient Details Name: Sara [name] MRN: [id number] DOB: 1946-08-02 Today's Date: 05/17/2012 Time: 1125-1150 OT Time Calculation (min): 25 min  OT Assessment / Plan / Recommendation Comments on Treatment Session Pt and spouse instructed on AE use and pt used to donn underwear.  Pt with easier time getting sit to stand today.    Follow Up Recommendations  Home health OT;Supervision/Assistance - 24 hour    Barriers to Discharge       Equipment Recommendations  Rolling walker with 5" wheels;3 in 1 bedside comode    Recommendations for Other Services    Frequency Min 2X/week   Plan Discharge plan remains appropriate    Precautions / Restrictions Precautions Precautions: Back Precaution Comments: Patient able to verbalize 3/3 back precautions. Able to avoided twisting at back this session  Required Braces or Orthoses: Spinal Brace Spinal Brace: Lumbar corset;Applied in sitting position Restrictions Weight Bearing Restrictions: No   Pertinent Vitals/Pain Pt with 6/10 pain when moving supine to sit.    ADL  Eating/Feeding: Performed;Independent Where Assessed - Eating/Feeding: Chair Grooming: Performed;Wash/dry hands;Wash/dry face Where Assessed - Grooming: Unsupported standing Upper Body Dressing: Performed;Set up Where Assessed - Upper Body Dressing: Unsupported sitting Lower Body Dressing: Performed;Minimal assistance Where Assessed - Lower Body Dressing: Supported sit to stand Toilet Transfer: Research scientist (life sciences) Method: Sit to stand;Stand Wellsite geologist: Raised toilet seat with arms (or 3-in-1 over toilet) Toileting - Clothing Manipulation and Hygiene: Performed;Supervision/safety Where Assessed - Glass blower/designer Manipulation and Hygiene: Standing Equipment Used: Back brace;Rolling walker Transfers/Ambulation Related to ADLs: S for all mobility. ADL Comments: Pt was given reacher to donn  underwear.  Pt able to do so with VCs only.  Husband will assist at home. he states they do not need a reacher for home.    OT Diagnosis:    OT Problem List:   OT Treatment Interventions:     OT Goals Acute Rehab OT Goals OT Goal Formulation: With patient Time For Goal Achievement: 05/21/12 Potential to Achieve Goals: Good ADL Goals Pt Will Perform Lower Body Bathing: with supervision;Sit to stand from chair;Sit to stand from bed;with adaptive equipment Pt Will Perform Lower Body Dressing: with supervision;Sit to stand from chair;Sit to stand from bed;with adaptive equipment ADL Goal: Lower Body Dressing - Progress: Progressing toward goals Pt Will Transfer to Toilet: with supervision;Ambulation;with DME;Comfort height toilet;Maintaining back safety precautions ADL Goal: Toilet Transfer - Progress: Met Pt Will Perform Toileting - Clothing Manipulation: with supervision;Standing ADL Goal: Toileting - Clothing Manipulation - Progress: Met Pt Will Perform Toileting - Hygiene: with supervision;Standing at 3-in-1/toilet;Sit to stand from 3-in-1/toilet ADL Goal: Toileting - Hygiene - Progress: Met Pt Will Perform Tub/Shower Transfer: Tub transfer;Shower seat with back;Ambulation;with DME;with supervision;Maintaining back safety precautions Additional ADL Goal #1: Pt will independently verbalize and generalize 3/3 back precautions during all ADL activity. ADL Goal: Additional Goal #1 - Progress: Met Miscellaneous OT Goals Miscellaneous OT Goal #1: Pt will perform bed mobility with supervision in prep for EOB ADLs. OT Goal: Miscellaneous Goal #1 - Progress: Met  Visit Information  Last OT Received On: 05/17/12 Assistance Needed: +1    Subjective Data      Prior Functioning       Cognition  Overall Cognitive Status: Appears within functional limits for tasks assessed/performed Arousal/Alertness: Awake/alert Orientation Level: Appears intact for tasks assessed Behavior During Session:  [facility] for tasks performed Cognition - Other Comments: intact.    Mobility Bed Mobility Bed Mobility: Supine to Sit  Supine to Sit: 5: Supervision Sitting - Scoot to Edge of Bed: 5: Supervision Details for Bed Mobility Assistance: Step by step instruction, and assist for LEs Transfers Transfers: Sit to Stand;Stand to Sit Sit to Stand: 5: Supervision;From bed Stand to Sit: 5: Supervision;To bed   Exercises    Balance    End of Session OT - End of Session Equipment Utilized During Treatment: Back brace Activity Tolerance: Patient tolerated treatment well Patient left: in bed;with call bell/phone within reach;with family/visitor present Nurse Communication: Mobility status  GO     Hope Budds 05/17/2012, 12:00 PM [phone]

## 2012-05-17 NOTE — Progress Notes (Signed)
Physical Therapy Treatment Patient Details Name: Sara [name] MRN: [id number] DOB: 28-Aug-1946 Today's Date: 05/17/2012 Time: 4098-1191 PT Time Calculation (min): 30 min  PT Assessment / Plan / Recommendation Comments on Treatment Session  Based on conversation with OT 05/16/12, pt assessed for BPPV (pt with + episodes of spinning/vertigo with supine to sit and sit to supine). Performed modified Hallpike-Dix due to pt s/p back surgery and tests were negative for BPPV for either Lt or Rt ear. Pt then reported h/o ringing in her Rt ear and feels her symptoms worsen when she has sinus pressure and drainage. Encouraged pt to continue to use her meclizine as MD ordered to lessen symptoms while her back is healling and to maintain a journal of symptoms. Encouraged f/u with an ENT after recovery from back surgery. Pt has been dealing with these episodes for several years. With regards to mobility, pt reports she feels prepared for d/c home and deferred any further PT instruction/practice this p.m. due to fatigue and abdominal discomfort.    Follow Up Recommendations  Home health PT;Supervision/Assistance - 24 hour    Barriers to Discharge        Equipment Recommendations  Rolling walker with 5" wheels;3 in 1 bedside comode    Recommendations for Other Services    Frequency Min 5X/week   Plan Discharge plan remains appropriate;Frequency remains appropriate    Precautions / Restrictions Precautions Precautions: Back Precaution Comments: Patient able to verbalize 3/3 back precautions. Able to avoided twisting at back this session  Required Braces or Orthoses: Spinal Brace Spinal Brace: Lumbar corset Restrictions Weight Bearing Restrictions: No   Pertinent Vitals/Pain Back pain 5/10    Mobility  Ambulation/Gait Assistance: Not tested (comment) (pt deferred; states up to BR multiple times today )    Exercises Other Exercises Other Exercises: Vestibular evaluation completed to assess for  potential BPPV. Pt reports she has never been given a diagnosis other than vertigo and a doctor at the sleep center is the one who told her to start taking meclizine (which she takes "for one solid day when these bouts start and it usually takes care of it"). Due to pt's recent back surgery, performed a modified Hallpike Dix to Rt and then Lt with no symptoms elicited (vertigo nor nystagmus). (Bed placed in reverse Trendelenburg, head rotated, and then bed lowered into trendelenburg). On further discussion, pt reported the occasional ringing in her left ear, and associates the vertigo onset with sinus pressure and drainage down her throat. Denies h/o Meniere's diseasse in her family.    PT Diagnosis:    PT Problem List:   PT Treatment Interventions:     PT Goals    Visit Information  Last PT Received On: 05/17/12 Assistance Needed: +1    Subjective Data  Subjective: Reports vertigo is usually triggered by supine to sit or sit to supine; sometimes spinning starts when just lying still; sometimes has ringing in her Lt ear   Cognition  Overall Cognitive Status: Appears within functional limits for tasks assessed/performed Arousal/Alertness: Awake/alert Orientation Level: Appears intact for tasks assessed Behavior During Session: [facility] for tasks performed Cognition - Other Comments: intact.    Balance     End of Session PT - End of Session Activity Tolerance: Patient tolerated treatment well Patient left: in bed;with call bell/phone within reach;with family/visitor present Nurse Communication: Other (comment) (re: pt's h/o vertigo and continued symptoms)   GP     Humphrey Guerreiro 05/17/2012, 3:27 PM

## 2012-05-17 NOTE — Discharge Summary (Signed)
Physician Discharge Summary  Patient ID: Sara [name] MRN: [id number] DOB/AGE: Jun 27, 1946 66 y.o.  Admit date: 05/13/2012 Discharge date: 05/17/2012  Admission Diagnoses:  Discharge Diagnoses:  Active Problems:  * No active hospital problems. *    Discharged Condition: good  Hospital Course: Surgery Friday with 2 level xlif. Did well. Slowly increased activity. Wounds healing well. Pain slowly decreasing. Ready for d/c POD 4. Only issue was lack of bowel movement. Plan was for laxative and d/c after it works.  Consults: None  Significant Diagnostic Studies: none  Treatments: surgery: L34 L45 xlif with perc screws  Discharge Exam: Blood pressure 98/65, pulse 100, temperature 98.1 F (36.7 C), temperature source Oral, resp. rate 18, height 5\' 5"  (1.651 m), weight 113.399 kg (250 lb), SpO2 95.00%. Incision/Wound:healing well except for small area of blistering anterior to lateral incisions.  Disposition: 01-Home or Self Care  Discharge Orders    Future Orders Please Complete By Expires   Diet general      Discharge instructions      Comments:   Mostly bedrest. Get up 9 or 10 times each day and walk for 15-20 minutes each time. Very little sitting the first week. No riding in the car until your first post op appointment. If you had neck surgery...may shower from the chest down. If you had low back surgery....you may shower with a saran wrap covering over the incision. Take your pain medicine as needed...and other medicines that you are instructed to take. Call for an appointment...[phone].   Call MD for:  temperature >100.4      Call MD for:  persistant nausea and vomiting      Call MD for:  severe uncontrolled pain      Call MD for:  redness, tenderness, or signs of infection (pain, swelling, redness, odor or green/yellow discharge around incision site)      Call MD for:  difficulty breathing, headache or visual disturbances      Call MD for:  hives        Medication  List  As of 05/17/2012 11:19 AM   STOP taking these medications         Butalbital-APAP-Caffeine 50-300-40 MG Caps      naproxen 500 MG tablet         TAKE these medications         cyclobenzaprine 10 MG tablet   Commonly known as: FLEXERIL   Take 10 mg by mouth 2 (two) times daily as needed. For muscle spasms      cyclobenzaprine 10 MG tablet   Commonly known as: FLEXERIL   Take 1 tablet (10 mg total) by mouth 3 (three) times daily as needed for muscle spasms.      fluticasone 50 MCG/ACT nasal spray   Commonly known as: FLONASE   Place 2 sprays into the nose at bedtime.      gabapentin 600 MG tablet   Commonly known as: NEURONTIN   Take 600 mg by mouth at bedtime.      HYDROmorphone 4 MG tablet   Commonly known as: DILAUDID   Take 4 mg by mouth every 4 (four) hours as needed. For pain      HYDROmorphone 4 MG tablet   Commonly known as: DILAUDID   Take 1 tablet (4 mg total) by mouth every 4 (four) hours as needed.      levothyroxine 88 MCG tablet   Commonly known as: SYNTHROID, LEVOTHROID   Take 88 mcg by mouth daily  before breakfast.      lisinopril-hydrochlorothiazide 20-12.5 MG per tablet   Commonly known as: PRINZIDE,ZESTORETIC   Take 2 tablets by mouth daily with breakfast.      LORazepam 1 MG tablet   Commonly known as: ATIVAN   Take 1 mg by mouth at bedtime as needed. For sleep      meclizine 25 MG tablet   Commonly known as: ANTIVERT   Take 25 mg by mouth 4 (four) times daily as needed. For nausea      metoprolol tartrate 25 MG tablet   Commonly known as: LOPRESSOR   Take 25 mg by mouth 2 (two) times daily.      omeprazole 40 MG capsule   Commonly known as: PRILOSEC   Take 40 mg by mouth daily before breakfast.      pravastatin 40 MG tablet   Commonly known as: PRAVACHOL   Take 40 mg by mouth daily with breakfast.             At home rest most of the time. Get up 9 or 10 times each day and take a 15 or 20 minute walk. No riding in the car  and to your first postoperative appointment. If you have neck surgery you may shower from the chest down starting on the third postoperative day. If you had back surgery he may start showering on the third postoperative day with saran wrap wrapped around your incisional area 3 times. After the shower remove the saran wrap. Take pain medicine as needed and other medications as instructed. Call my office for an appointment.  SignedReinaldo Meeker, MD 05/17/2012, 11:19 AM

## 2012-05-19 ENCOUNTER — Encounter (HOSPITAL_COMMUNITY): Payer: Self-pay

## 2012-06-07 ENCOUNTER — Other Ambulatory Visit (HOSPITAL_COMMUNITY): Payer: Self-pay | Admitting: Family Medicine

## 2012-06-07 DIAGNOSIS — Z139 Encounter for screening, unspecified: Secondary | ICD-10-CM

## 2012-06-17 ENCOUNTER — Encounter (HOSPITAL_BASED_OUTPATIENT_CLINIC_OR_DEPARTMENT_OTHER): Payer: Medicare Other | Attending: General Surgery

## 2012-06-17 DIAGNOSIS — S21209A Unspecified open wound of unspecified back wall of thorax without penetration into thoracic cavity, initial encounter: Secondary | ICD-10-CM | POA: Insufficient documentation

## 2012-06-17 DIAGNOSIS — Y838 Other surgical procedures as the cause of abnormal reaction of the patient, or of later complication, without mention of misadventure at the time of the procedure: Secondary | ICD-10-CM | POA: Insufficient documentation

## 2012-06-24 ENCOUNTER — Encounter (HOSPITAL_BASED_OUTPATIENT_CLINIC_OR_DEPARTMENT_OTHER): Payer: Medicare Other

## 2012-06-24 ENCOUNTER — Encounter (HOSPITAL_BASED_OUTPATIENT_CLINIC_OR_DEPARTMENT_OTHER): Payer: Medicare Other | Attending: General Surgery

## 2012-06-24 DIAGNOSIS — L98499 Non-pressure chronic ulcer of skin of other sites with unspecified severity: Secondary | ICD-10-CM | POA: Insufficient documentation

## 2012-06-24 DIAGNOSIS — T8189XA Other complications of procedures, not elsewhere classified, initial encounter: Secondary | ICD-10-CM | POA: Insufficient documentation

## 2012-06-24 DIAGNOSIS — T8140XA Infection following a procedure, unspecified, initial encounter: Secondary | ICD-10-CM | POA: Insufficient documentation

## 2012-06-24 DIAGNOSIS — Y838 Other surgical procedures as the cause of abnormal reaction of the patient, or of later complication, without mention of misadventure at the time of the procedure: Secondary | ICD-10-CM | POA: Insufficient documentation

## 2012-06-24 DIAGNOSIS — Z8614 Personal history of Methicillin resistant Staphylococcus aureus infection: Secondary | ICD-10-CM | POA: Insufficient documentation

## 2012-06-28 ENCOUNTER — Telehealth: Payer: Self-pay | Admitting: Orthopedic Surgery

## 2012-06-28 NOTE — Telephone Encounter (Signed)
Sara [name] said she had back surgery 05/13/12, but her knees are still hurting.  The right one is worse than the left.  Someone has told her about gel injections that really help the knee pain.  She wants to know if that is something you think she would be a candidate for, and if you do them. Her # [phone]

## 2012-06-29 NOTE — Telephone Encounter (Signed)
Sara [name], [name] verified that she has [employer] and Medicaid.  Please call her and explain the precert and ordering process with her. Thanks

## 2012-06-29 NOTE — Telephone Encounter (Signed)
Looks like [employer] so start precert

## 2012-07-01 NOTE — Telephone Encounter (Signed)
Called patient, unavailable, no option to leave message 

## 2012-07-11 ENCOUNTER — Ambulatory Visit (HOSPITAL_COMMUNITY)
Admission: RE | Admit: 2012-07-11 | Discharge: 2012-07-11 | Disposition: A | Discharge status: Home / Self Care | Payer: Medicare Other | Source: Ambulatory Visit | Attending: Family Medicine | Admitting: Family Medicine

## 2012-07-11 DIAGNOSIS — Z139 Encounter for screening, unspecified: Secondary | ICD-10-CM

## 2012-07-11 DIAGNOSIS — Z1231 Encounter for screening mammogram for malignant neoplasm of breast: Secondary | ICD-10-CM | POA: Insufficient documentation

## 2012-07-15 ENCOUNTER — Encounter (HOSPITAL_BASED_OUTPATIENT_CLINIC_OR_DEPARTMENT_OTHER): Payer: Medicare Other

## 2012-07-18 NOTE — Telephone Encounter (Signed)
Called patient, unavailable, left message

## 2012-09-06 ENCOUNTER — Ambulatory Visit (INDEPENDENT_AMBULATORY_CARE_PROVIDER_SITE_OTHER): Payer: Medicaid Other | Admitting: Orthopedic Surgery

## 2012-09-06 VITALS — Ht 66.0 in | Wt 249.0 lb

## 2012-09-06 DIAGNOSIS — M7650 Patellar tendinitis, unspecified knee: Secondary | ICD-10-CM

## 2012-09-06 MED ORDER — DICLOFENAC SODIUM 1 % TD GEL: 2.0000 g | Freq: Four times a day (QID) | TRANSDERMAL | Status: DC

## 2012-09-06 NOTE — Patient Instructions (Signed)
Schedule for physical therapy

## 2012-09-07 ENCOUNTER — Encounter: Payer: Self-pay | Admitting: Orthopedic Surgery

## 2012-09-07 DIAGNOSIS — M7650 Patellar tendinitis, unspecified knee: Secondary | ICD-10-CM | POA: Insufficient documentation

## 2012-09-07 NOTE — Progress Notes (Signed)
Subjective:     Patient ID: Sara [name], female   DOB: 1946-02-02, 66 y.o.   MRN: [id number]  Chief Complaint  Patient presents with  . Follow-up    Recheck right knee. Anterior knee pain    HPI 66 year old female status post anterior lumbar fusion with a history of osteoarthritis in both knees presents with anterior knee pain on the right. She complains of weakness in the right lower extremity. She had some lateral burning pain which has subsequently resolved. The lumbar fusion was in August. Her back pain has improved significantly.  She complains of pain in the front of her right knee over the patellar tendon which is exacerbated by sitting for long periods of time getting out of a chair and also when negotiating the steps. The knee will also give way at times. She has no pain in the posterior thigh or in the popliteal fossa   Review of Systems she states otherwise she is doing well no evidence of peripheral neuropathy or neuro neurologic tingling.     Objective:      Physical Exam  Musculoskeletal:       Right knee: She exhibits no effusion.       Left knee: She exhibits no effusion.   Right Knee Exam   Tenderness  The patient is experiencing tenderness in the pes anserinus and patellar tendon.  Range of Motion  The patient has normal right knee ROM.  Muscle Strength   The patient has normal right knee strength.  Tests  McMurray:  Medial - negative Lateral - negative Lachman:  Anterior - negative     Drawer:       Anterior - negative    Posterior - negative Varus: negative Valgus: negative  Other  Erythema: absent Scars: absent Sensation: none Pulse: present Swelling: none Other tests: no effusion present   Left Knee Exam   Tenderness  The patient is experiencing tenderness in the pes anserinus and patellar tendon.  Range of Motion  The patient has normal left knee ROM.  Tests  McMurray:  Medial - negative Lateral - negative Lachman:  Anterior  - negative     Drawer:       Anterior - negative     Posterior - negative Varus: negative Valgus: negative  Other  Erythema: absent Scars: absent Sensation: none Pulse: present Swelling: none Effusion: no effusion present     she is otherwise awake alert and oriented x3 her mood and affect are normal she has moderate obesity. She seem to the port with a right-handed cane.    Assessment:     Patellar tendinitis bilateral right greater than left Osteoarthritis stable    Plan:     Recommend physical therapy for patellar tendinitis, use Voltaren gel topical anti-inflammatory 2 g 4 times a day  Followup 8 weeks.  If the arthritis starts to bother her and recommend 6-12 month period of recovery from her lumbar fusion prior to addressing any arthritic changes in the knee. New x-ray prior to any surgical intervention. I would like to see how much strength she gets in the knee and in the right leg after the lumbar fusion to determine if the weakness is secondary to poor nerve recovery and quadriceps function.

## 2012-09-08 ENCOUNTER — Ambulatory Visit (HOSPITAL_COMMUNITY)
Admission: RE | Admit: 2012-09-08 | Discharge: 2012-09-08 | Disposition: A | Discharge status: Home / Self Care | Payer: Medicare Other | Source: Ambulatory Visit | Attending: Orthopedic Surgery | Admitting: Orthopedic Surgery

## 2012-09-08 DIAGNOSIS — M25569 Pain in unspecified knee: Secondary | ICD-10-CM | POA: Insufficient documentation

## 2012-09-08 DIAGNOSIS — IMO0002 Reserved for concepts with insufficient information to code with codable children: Secondary | ICD-10-CM | POA: Diagnosis present

## 2012-09-08 DIAGNOSIS — M6281 Muscle weakness (generalized): Secondary | ICD-10-CM | POA: Insufficient documentation

## 2012-09-08 DIAGNOSIS — IMO0001 Reserved for inherently not codable concepts without codable children: Secondary | ICD-10-CM | POA: Insufficient documentation

## 2012-09-08 DIAGNOSIS — M7650 Patellar tendinitis, unspecified knee: Secondary | ICD-10-CM | POA: Diagnosis present

## 2012-09-08 DIAGNOSIS — M1710 Unilateral primary osteoarthritis, unspecified knee: Secondary | ICD-10-CM | POA: Diagnosis present

## 2012-09-08 DIAGNOSIS — M25669 Stiffness of unspecified knee, not elsewhere classified: Secondary | ICD-10-CM | POA: Insufficient documentation

## 2012-09-08 NOTE — Evaluation (Signed)
Physical Therapy Evaluation  Patient Details  Name: Sara [name] MRN: [id number] Date of Birth: 05/09/1946  Today's Date: 09/08/2012 Time: 1022-1100 PT Time Calculation (min): 38 min Charges: 1 eval, 8' IE Visit#: 1  of 8   Re-eval: 10/08/12 Assessment Diagnosis: B knee pain Next MD Visit: Dr. Romeo Apple - 11/09/12 Prior Therapy: 2-3 years ago for pain  Authorization: MEDICARE  Authorization Time Period:    Authorization Visit#: 1  of 10    Past Medical History:  Past Medical History  Diagnosis Date  . Thyroid disease   . Chronic back pain   . Reflux   . Unspecified arthropathy, shoulder region   . Pure hypercholesterolemia     takes Pravastin daily  . Allergic rhinitis due to pollen   . Cervicalgia   . Diverticulosis of colon (without mention of hemorrhage)   . Morbid obesity   . Palpitations   . Peptic ulcer, unspecified site, unspecified as acute or chronic, without mention of hemorrhage, perforation, or obstruction   . Peripheral vertigo, unspecified     takes Meclizine prn  . Reflux esophagitis   . Spinal stenosis in cervical region   . 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 Personal history of tobacco use, presenting hazards to health   . Insomnia, unspecified   . Lumbago   . Right leg pain   . Shortness of breath   . H/O hiatal hernia   . HTN (hypertension)     takes Metoprolol and Lisinopril daily  . Hypertension   . Sleep apnea     slight but doesn't require a CPAP  . History of bronchitis     > 66yr ago  . Seasonal allergies     uses Flonase daily  . Headache     denies migraines since the 80's but has occ and takes Butalbital prn  . Cervical spondylosis without myelopathy     all over  . Degeneration of cervical intervertebral disc   . Primary localized osteoarthrosis, lower leg   . GERD (gastroesophageal reflux disease)     takes Omeprazole daily  . Constipation   .  Diverticulosis   . History of bladder infections   . Urinary incontinence     occasionally  . Other postablative hypothyroidism     takes SYnthroid daily  . Insomnia     takes Ativan nigtly   Past Surgical History:  Past Surgical History  Procedure Date  . Knee surgery     arthroscopy-- R  . Tonsillectomy   . Appendectomy   . Colonoscopy Sept 2008    RMR: normal rectum, left-sided diverticula, repeat in 2018  . Abdominal hysterectomy   . Upper gastrointestinal endoscopy   . Breast lumpectomy     left   . Eye surgery     cataracts removed- bilateral, /w IOL  . Lumpectomy on pelvis   . Diagnostic laparoscopy   . Tubal ligation   . Esophagogastroduodenoscopy   . Anterior lat lumbar fusion 05/13/2012    Procedure: ANTERIOR LATERAL LUMBAR FUSION 2 LEVELS;  Surgeon: Reinaldo Meeker, MD;  Location: [facility];  Service: Neurosurgery;  Laterality: Left;  Left Lumbar Three-four,Lumbar four-five Extreme Lumbar Interbody Fusion with Percutaneous Pedicle Screws  . Spinal fusion     Subjective Symptoms/Limitations Pertinent History: Pt is referred to PT R knee pain and L leg weakness which she reports started after back surgery in August.  She has a PMH of knee pain and has had PT in the past to address knee symptoms.  She explains the most pain to her lateral leg and to her R joint line.  She reports that her pain is typically worse in the night time. nature: locked leg.  Does not have relief with taking Tyelono. How long can you sit comfortably?: no difficulty. Pain with STS How long can you stand comfortably?: 10-15 minutes  How long can you walk comfortably?: always has pain.  Patient Stated Goals: Pt wishes to ease her knee pain and be able to take Tyelonol to ease the pain.  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  Cognition/Observation Observation/Other Assessments Observations: decreased hamstring, quadricep and hip flexor muscle length; genu valgum to B LE; B gluteal atrophy Other Assessments: significant crepitis and griding to patella w/knee extension  Sensation/Coordination/Flexibility/Functional Tests Functional Tests Functional Tests: LEFS: 12/80  Assessment RLE Strength Right Hip Flexion: 3-/5 Right Hip Extension: 3/5 Right Hip ABduction: 3-/5 Right Knee Flexion: 3+/5 Right Knee Extension: 3+/5 (pain) LLE Strength Left Hip Flexion: 3-/5 Left Hip Extension: 3/5 Left Hip ABduction: 3+/5 Left Knee Flexion: 4/5 Left Knee Extension: 4/5 Palpation Palpation: increased pain and tenderness to R knee joint line, pes ansering and L knee joint line and pes anserine.  Decreased PA and AP mobility to R fibular head with significant pain.  Pain with R patellar mobilizations    Mobility/Balance  Ambulation/Gait Ambulation/Gait: Yes Gait Pattern: Antalgic;Decreased stance time - right;Decreased hip/knee flexion - right;Trunk flexed Static Standing Balance Single Leg Stance - Right Leg: 2  Single Leg Stance - Left Leg: 3  Tandem Stance - Right Leg: 10  Tandem Stance - Left Leg: 10  Rhomberg - Eyes Opened: 10  Rhomberg - Eyes Closed: 10    Exercise/Treatments Supine Bridges: 10 reps Straight Leg Raises: Both;5 reps Prone  Hamstring Curl: 10 reps (bilateral) Hip Extension: Both;5 reps   Physical Therapy Assessment and Plan PT Assessment and Plan Clinical Impression Statement: Pt is a 66 year old female referred to PT for B knee pain (R>L) with a significant hx of OA to both knees. Pt will benefit from skilled therapeutic intervention in order to improve on the following deficits: Abnormal gait;Decreased balance;Decreased activity tolerance;Decreased mobility;Decreased  strength;Impaired perceived functional ability;Increased fascial restricitons;Impaired flexibility;Pain;Obesity Rehab Potential: Good PT Frequency: Min 2X/week PT Duration: 4 weeks PT Treatment/Interventions: Gait training;Stair training;Functional mobility training;Therapeutic activities;Therapeutic exercise;Balance training;Neuromuscular re-education;Patient/family education;Manual techniques;Modalities PT Plan: KEEP PAIN LOW.  functional squats, heel and toe raises, standing knee flexion, standing TKE, 4 way SLR, SAQ.  Progress to stair training, and balance activities, improve gait mechanics, reduce pain with modalities and manual techniques    Goals Home Exercise Program Pt will Perform Home Exercise Program: Independently PT Goal: Perform Home Exercise Program - Progress: Goal set today PT Short Term Goals PT Short Term Goal 1: Pt will report pain less than 5/10 during the night time hours.  PT Short Term Goal 2: Pt will improve her patellar and R fibular head mobility and present with moderate fascial restrcitions to R LE.  PT Short Term Goal 3: Pt will improve her LE strength by 1 muscle grade.  PT Short Term Goal 4: Pt will demonstrate R and L  SLS x10 sec on solid surface.  PT Long Term Goals PT Long Term Goal 1: Pt will improve LE strength to [facility] in order to transition from STS without increased pain.  PT Long Term Goal 2: Pt will improve her LEFS to 40/80 for improved percieved functional ability.  Long Term Goal 3: Pt will improve her LE strength to WNL in order to tolerate walking and standing for greater than 30 minutes to continue with light household activities to improve QOL.   Problem List Patient Active Problem List  Diagnosis  . HYPOTHYROIDISM  . HYPERLIPIDEMIA  . OBESITY NOS  . MORBID OBESITY  . BENIGN POSITIONAL VERTIGO  . HYPERTENSION  . ALLERGIC RHINITIS, SEASONAL  . GERD  . PUD  . DIVERTICULOSIS, COLON  . OVERACTIVE BLADDER  . FIBROCYSTIC BREAST DISEASE  .  OSTEOARTHRITIS  . KNEE, ARTHRITIS, DEGEN./OSTEO  . H N P-LUMBAR  . SPINAL STENOSIS, CERVICAL  . SPINAL STENOSIS, LUMBAR  . BACK PAIN  . ANSERINE BURSITIS  . Patellar tendinitis  . PALPITATIONS  . CHEST PAIN, RECURRENT  . OTHER DYSPHAGIA  . TEAR LATERAL MENISCUS  . LIVER FUNCTION TESTS, ABNORMAL, HX OF  . INTERMITTENT VERTIGO  . GERD (gastroesophageal reflux disease)  . Dysphagia  . RUQ pain  . Fatty liver  . Hematochezia  . OA (osteoarthritis) of knee  . Patellar tendonitis    PT Plan of Care PT Home Exercise Plan: see scanned report PT Patient Instructions: discussed using ice for pain control Consulted and Agree with Plan of Care: Patient  GP Functional Assessment Tool Used: LEFS and clinical observation Functional Limitation: Mobility: Walking and moving around Mobility: Walking and Moving Around Current Status (Z6109): At least 60 percent but less than 80 percent impaired, limited or restricted Mobility: Walking and Moving Around Goal Status [phone]): At least 20 percent but less than 40 percent impaired, limited or restricted  Seraiah Nowack 09/08/2012, 11:25 AM  Physician Documentation Your signature is required to indicate approval of the treatment plan as stated above.  Please sign and either send electronically or make a copy of this report for your files and return this physician signed original.   Please mark one 1.__approve of plan  2. ___approve of plan with the following conditions.   ______________________________                                                          _____________________ Physician Signature                                                                                                             Date

## 2012-09-15 ENCOUNTER — Other Ambulatory Visit: Payer: Self-pay | Admitting: *Deleted

## 2012-09-15 ENCOUNTER — Ambulatory Visit (HOSPITAL_COMMUNITY)
Admission: RE | Admit: 2012-09-15 | Discharge: 2012-09-15 | Disposition: A | Discharge status: Home / Self Care | Payer: Medicare Other | Source: Ambulatory Visit | Attending: Family Medicine | Admitting: Family Medicine

## 2012-09-15 NOTE — Progress Notes (Signed)
Physical Therapy Treatment Patient Details  Name: Sara [name] MRN: [id number] Date of Birth: 1946-07-04  Today's Date: 09/15/2012 Time: 1302-1400 PT Time Calculation (min): 58 min  Visit#: 2  of 8   Re-eval: 10/08/12 Assessment Diagnosis: B knee pain Next MD Visit: Dr. Romeo Apple - 11/09/12 Prior Therapy: 2-3 years ago for pain Charge: therex 40', IFES/Ice x 15'  Authorization: Medicare  Authorization Time Period:    Authorization Visit#: 2  of 10    Subjective: Symptoms/Limitations Symptoms: Pt stated knees are feeling better, pain scale 2/10 B knees R>L.  Pt stated compliance with HEP daily 1 set each exercise. Pain Assessment Currently in Pain?: Yes Pain Score:   2 Pain Location: Knee Pain Orientation: Right;Left  Objective:   Exercise/Treatments Standing Heel Raises: 10 reps;Limitations Heel Raises Limitations: toe raises 10 reps Knee Flexion: Both;10 reps Functional Squat: 10 reps;Limitations Functional Squat Limitations: mini squats  Supine Short Arc Quad Sets: Both;10 reps Bridges: 10 reps Straight Leg Raises: Both;10 reps Sidelying Hip ABduction: Both;10 reps Hip ADduction: Both;10 reps Prone  Hamstring Curl: 10 reps Hip Extension: Both;10 reps   Modalities Modalities: Cryotherapy;Electrical Stimulation Cryotherapy Number Minutes Cryotherapy: 15 Minutes Cryotherapy Location: Knee Type of Cryotherapy: Ice pack Pharmacologist Location: B knees Electrical Stimulation Action: reduce pain Electrical Stimulation Parameters: IFES hi/lo sweep 15.5-->19 with ice and legs elevated Electrical Stimulation Goals: Pain  Physical Therapy Assessment and Plan PT Assessment and Plan Clinical Impression Statement: Began POC per PT plan for BLE strengthening and pain reduction.  Pt able to perform all exercises with good form following cues for technique with no c/o pain except for R knee with minisquats.  Ended session wtih IFES  and ice for pain reduction. PT Plan: Keep pain low.  Continue with current POC for LE strengthening, progress to stair training and balance activities, improve gait mechanics and continue with modalities and manual techniques to reduce pain.    Goals    Problem List Patient Active Problem List  Diagnosis  . HYPOTHYROIDISM  . HYPERLIPIDEMIA  . OBESITY NOS  . MORBID OBESITY  . BENIGN POSITIONAL VERTIGO  . HYPERTENSION  . ALLERGIC RHINITIS, SEASONAL  . GERD  . PUD  . DIVERTICULOSIS, COLON  . OVERACTIVE BLADDER  . FIBROCYSTIC BREAST DISEASE  . OSTEOARTHRITIS  . KNEE, ARTHRITIS, DEGEN./OSTEO  . H N P-LUMBAR  . SPINAL STENOSIS, CERVICAL  . SPINAL STENOSIS, LUMBAR  . BACK PAIN  . ANSERINE BURSITIS  . Patellar tendinitis  . PALPITATIONS  . CHEST PAIN, RECURRENT  . OTHER DYSPHAGIA  . TEAR LATERAL MENISCUS  . LIVER FUNCTION TESTS, ABNORMAL, HX OF  . INTERMITTENT VERTIGO  . GERD (gastroesophageal reflux disease)  . Dysphagia  . RUQ pain  . Fatty liver  . Hematochezia  . OA (osteoarthritis) of knee  . Patellar tendonitis    PT - End of Session Activity Tolerance: Patient tolerated treatment well General Behavior During Session: [facility] for tasks performed Cognition: [facility] for tasks performed  GP    Juel Burrow 09/15/2012, 4:58 PM

## 2012-09-20 ENCOUNTER — Ambulatory Visit (HOSPITAL_COMMUNITY)
Admission: RE | Admit: 2012-09-20 | Discharge: 2012-09-20 | Disposition: A | Discharge status: Home / Self Care | Payer: Medicare Other | Source: Ambulatory Visit

## 2012-09-20 NOTE — Progress Notes (Signed)
Physical Therapy Treatment Patient Details  Name: Sara [name] MRN: [id number] Date of Birth: [birth year]  Today's Date: 09/20/2012 Time: 6962-9528 PT Time Calculation (min): 49 min Charge: therex 38', ice 10'  Visit#: 3  of 8   Re-eval: 10/08/12 Assessment Diagnosis: B knee pain Next MD Visit: Dr. Romeo Apple - 11/09/12 Prior Therapy: 2-3 years ago for pain  Authorization: Medicare  Authorization Time Period:    Authorization Visit#: 3  of 10    Subjective: Symptoms/Limitations Symptoms: Pt stated B knees pain free today. 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 Pain Assessment Currently in Pain?: No/denies  Precautions/Restrictions  Precautions Precautions: None  Exercise/Treatments Stretches Active Hamstring Stretch: 3 reps;30 seconds Gastroc Stretch: 3 reps;30 seconds;Limitations Gastroc Stretch Limitations: slant board Standing Heel Raises: 10 reps;Limitations Heel Raises Limitations: toe raises 15 reps Knee Flexion: Both;15 reps Lateral Step Up: Both;10 reps;Step Height: 2" Forward Step Up: Right;Left;10 reps;Hand Hold: 0;Step Height: 4" Functional Squat: 15 reps;Limitations Functional Squat Limitations: mini squats  Rocker Board: 2 minutes;Limitations Rocker Board Limitations: R/L no HHA SLS: R 10", L 11" max of 3 Other Standing Knee Exercises: tandem stance 3x 30" Supine Bridges: 10 reps;Limitations Bridges Limitations: orange ball between knees   Modalities Modalities: Cryotherapy Manual Therapy Manual Therapy: Joint mobilization Joint Mobilization: Patella mobs S/I R/L and tib/fib Cryotherapy Number Minutes Cryotherapy: 10 Minutes Cryotherapy Location: Knee Type of Cryotherapy: Ice pack  Physical Therapy Assessment and Plan PT Assessment and Plan Clinical Impression Statement: Began stair training for LE functional strengthening, pt able to demonstrate appropriate technique following cues to  reduce hip hiking with lateral step ups.  Pt able to perform all exercises with good form following cues with only c/o pain R knee during minisquats.  Patella mobs and ice complete at end of session for pain reduction. PT Plan: Keep pain low. Continue with current POC for LE strengthening, progress to stair training and balance activities, improve gait mechanics and continue with modalities and manual techniques to reduce pain    Goals    Problem List Patient Active Problem List  Diagnosis  . HYPOTHYROIDISM  . HYPERLIPIDEMIA  . OBESITY NOS  . MORBID OBESITY  . BENIGN POSITIONAL VERTIGO  . HYPERTENSION  . ALLERGIC RHINITIS, SEASONAL  . GERD  . PUD  . DIVERTICULOSIS, COLON  . OVERACTIVE BLADDER  . FIBROCYSTIC BREAST DISEASE  . OSTEOARTHRITIS  . KNEE, ARTHRITIS, DEGEN./OSTEO  . H N P-LUMBAR  . SPINAL STENOSIS, CERVICAL  . SPINAL STENOSIS, LUMBAR  . BACK PAIN  . ANSERINE BURSITIS  . Patellar tendinitis  . PALPITATIONS  . CHEST PAIN, RECURRENT  . OTHER DYSPHAGIA  . TEAR LATERAL MENISCUS  . LIVER FUNCTION TESTS, ABNORMAL, HX OF  . INTERMITTENT VERTIGO  . GERD (gastroesophageal reflux disease)  . Dysphagia  . RUQ pain  . Fatty liver  . Hematochezia  . OA (osteoarthritis) of knee  . Patellar tendonitis    PT - End of Session Activity Tolerance: Patient tolerated treatment well General Behavior During Session: [facility] for tasks performed Cognition: [facility] for tasks performed  GP    Juel Burrow 09/20/2012, 12:36 PM

## 2012-09-22 ENCOUNTER — Ambulatory Visit (HOSPITAL_COMMUNITY): Payer: Medicare Other

## 2012-09-27 ENCOUNTER — Ambulatory Visit (HOSPITAL_COMMUNITY)
Admission: RE | Admit: 2012-09-27 | Discharge: 2012-09-27 | Disposition: A | Discharge status: Home / Self Care | Payer: Medicare Other | Source: Ambulatory Visit | Attending: Orthopedic Surgery | Admitting: Orthopedic Surgery

## 2012-09-27 DIAGNOSIS — IMO0001 Reserved for inherently not codable concepts without codable children: Secondary | ICD-10-CM | POA: Insufficient documentation

## 2012-09-27 DIAGNOSIS — M6281 Muscle weakness (generalized): Secondary | ICD-10-CM | POA: Insufficient documentation

## 2012-09-27 DIAGNOSIS — M25569 Pain in unspecified knee: Secondary | ICD-10-CM | POA: Insufficient documentation

## 2012-09-27 DIAGNOSIS — M25669 Stiffness of unspecified knee, not elsewhere classified: Secondary | ICD-10-CM | POA: Insufficient documentation

## 2012-09-27 NOTE — Progress Notes (Signed)
Physical Therapy Treatment Patient Details  Name: Sara [name] MRN: [id number] Date of Birth: 31-Mar-1946  Today's Date: 09/27/2012 Time: 1105-1210 PT Time Calculation (min): 65 min  Visit#: 4  of 8   Re-eval: 10/08/12  Charge: Gait 8', therex 38', manual 8', IFES x 10', Ice x 1  Authorization: Medicare  Authorization Time Period:    Authorization Visit#: 4  of 10    Subjective: Symptoms/Limitations Symptoms: Pt stated knees are feeling okay today, no c/o pain.  Pt stated LBP pain scale 3/10. Pain Assessment Currently in Pain?: Yes Pain Score:   3 Pain Location: Back Pain Orientation: Lower  Objective:  Exercise/Treatments Aerobic Tread Mill: Gait trainer x 8 min .50 cyc/sec Standing Lateral Step Up: Both;10 reps;Hand Hold: 1;Step Height: 4" Forward Step Up: Both;10 reps;Hand Hold: 0;Step Height: 4" Rocker Board: 2 minutes;Limitations Rocker Board Limitations: R/L no HHA SLS: R 8", L 15" max of 3 Other Standing Knee Exercises: tandem stance 2x 30"; tandem gait 1RT, retro tandem gait 1RT Seated Other Seated Knee Exercises: 5 STS each foot behind no HHA   Modalities Modalities: Cryotherapy;Electrical Stimulation Manual Therapy Manual Therapy: Joint mobilization Joint Mobilization: Patella mobs S/I R/L and tib/fib x 8 min for pain reduction Cryotherapy Number Minutes Cryotherapy: 10 Minutes Cryotherapy Location: Knee (B knees) Type of Cryotherapy: Ice pack Pharmacologist Location: R knee Statistician Action: reduce pain Electrical Stimulation Parameters: IFES hi/lo 19v with ice and legs elevated Electrical Stimulation Goals: Pain  Physical Therapy Assessment and Plan PT Assessment and Plan Clinical Impression Statement: Began gait trainer on treadmill to equalize stride length for more normalized gait mechanics, pt limited by fatigue with new activity and did require multiple rest breaks but no c/o increased knee pain.   Pt LE functional strength is improving but continues to be limited by fatigue due to weakness and decreased activity tolerance.  Able to increase step training height to 4 in with lateral step up this session with improved technique, less cueing required to reduce hip hiking to maximize quad strengthening with activity.  STS no HHA complete with both feet behind, no assistance required just vc-ing for nose over toes and controlling descent when seating, R LE more difficult behind.  Ended session with manual joint mobs and IFES w/ ice for pain reduction. PT Plan: Keep pain low. Continue with current POC for LE strengthening, progress the stair training and balance activities, improve gait mechanics and continue with modalities and manual techniques to reduce pain .  Next session begin seated heel rollout to improve hip strengthening and reduce ER with standing activities.    Goals    Problem List Patient Active Problem List  Diagnosis  . HYPOTHYROIDISM  . HYPERLIPIDEMIA  . OBESITY NOS  . MORBID OBESITY  . BENIGN POSITIONAL VERTIGO  . HYPERTENSION  . ALLERGIC RHINITIS, SEASONAL  . GERD  . PUD  . DIVERTICULOSIS, COLON  . OVERACTIVE BLADDER  . FIBROCYSTIC BREAST DISEASE  . OSTEOARTHRITIS  . KNEE, ARTHRITIS, DEGEN./OSTEO  . H N P-LUMBAR  . SPINAL STENOSIS, CERVICAL  . SPINAL STENOSIS, LUMBAR  . BACK PAIN  . ANSERINE BURSITIS  . Patellar tendinitis  . PALPITATIONS  . CHEST PAIN, RECURRENT  . OTHER DYSPHAGIA  . TEAR LATERAL MENISCUS  . LIVER FUNCTION TESTS, ABNORMAL, HX OF  . INTERMITTENT VERTIGO  . GERD (gastroesophageal reflux disease)  . Dysphagia  . RUQ pain  . Fatty liver  . Hematochezia  . OA (osteoarthritis) of knee  .  Patellar tendonitis    PT - End of Session Activity Tolerance: Patient tolerated treatment well;Patient limited by fatigue General Behavior During Session: [facility] for tasks performed Cognition: [facility] for tasks performed  GP    Juel Burrow 09/27/2012, 12:14 PM

## 2012-09-29 ENCOUNTER — Telehealth: Payer: Self-pay | Admitting: *Deleted

## 2012-09-29 ENCOUNTER — Ambulatory Visit (HOSPITAL_COMMUNITY)
Admission: RE | Admit: 2012-09-29 | Discharge: 2012-09-29 | Disposition: A | Discharge status: Home / Self Care | Payer: Medicare Other | Source: Ambulatory Visit | Attending: Orthopedic Surgery | Admitting: Orthopedic Surgery

## 2012-09-29 NOTE — Progress Notes (Signed)
Physical Therapy Treatment Patient Details  Name: Sara [name] MRN: [id number] Date of Birth: [birth year]  Today's Date: 09/29/2012 Time: 1105-1212 PT Time Calculation (min): 67 min  Visit#: 5  of 8   Re-eval: 10/08/12  Charge: Gait 8', therex 38', manual 8', IFES x 10', Ice x 1   Authorization: Medicare  Authorization Time Period:    Authorization Visit#: 5  of 10    Subjective: Symptoms/Limitations Symptoms: PT stated she was sore following last session pain scale 2/10 Bknees.  Pt very motivated this session. Pain Assessment Currently in Pain?: Yes Pain Score:   2 Pain Location: Knee Pain Orientation: Right;Left  Objective:   Exercise/Treatments Aerobic Tread Mill: Gait trainer x 8 min .50 cyc/sec Standing Knee Flexion: Both;15 reps Lateral Step Up: Both;10 reps;Hand Hold: 1;Step Height: 4" Forward Step Up: Both;10 reps;Hand Hold: 0;Step Height: 4" Functional Squat: 15 reps;Limitations Functional Squat Limitations: mini squats  Rocker Board: 2 minutes;Limitations Rocker Board Limitations: R/L no HHA SLS: R 18", L 8" max of 3 Other Standing Knee Exercises: tandem stance 2x 30"; tandem gait 2RT, retro tandem gait 1RT Seated Other Seated Knee Exercises: 5 STS each foot behind no HHA   Modalities Modalities: Cryotherapy;Electrical Stimulation Manual Therapy Manual Therapy: Joint mobilization Joint Mobilization: Patella mobs S/I R/L and tib/fib x 8 min for pain reduction BLE Cryotherapy Number Minutes Cryotherapy: 10 Minutes Cryotherapy Location: Knee Type of Cryotherapy: Ice pack Pharmacologist Location: B knees Electrical Stimulation Action: reduce pain Electrical Stimulation Parameters: IFES hi/lo sweep 19.5 v with ice and legs elevated  Electrical Stimulation Goals: Pain  Physical Therapy Assessment and Plan PT Assessment and Plan Clinical Impression Statement: Improved gait mechanics on TM today with less cueing to  increase stride length and for posture, pt did require 1 rest break 6' into gait training due to limited activity tolerance, no c/o increase pain.  Overall LE strength improving, increased ease with STS.  Pt required min-mod assistance with LOB episodes with balance activities, did improve with cueing for spatial awareness.  Pt stated pain reduced at end of session following manual patalla mobs and IFES wtih ice. PT Plan: Continue with current POC for B LE strengthening, balance and gait mechanics.  Continue with manual techniques and modalities to reduce pain.  Next session begin step down and seated heel roll out to improve hip mobility/strength and reduce ER with standing activties.    Goals    Problem List Patient Active Problem List  Diagnosis  . HYPOTHYROIDISM  . HYPERLIPIDEMIA  . OBESITY NOS  . MORBID OBESITY  . BENIGN POSITIONAL VERTIGO  . HYPERTENSION  . ALLERGIC RHINITIS, SEASONAL  . GERD  . PUD  . DIVERTICULOSIS, COLON  . OVERACTIVE BLADDER  . FIBROCYSTIC BREAST DISEASE  . OSTEOARTHRITIS  . KNEE, ARTHRITIS, DEGEN./OSTEO  . H N P-LUMBAR  . SPINAL STENOSIS, CERVICAL  . SPINAL STENOSIS, LUMBAR  . BACK PAIN  . ANSERINE BURSITIS  . Patellar tendinitis  . PALPITATIONS  . CHEST PAIN, RECURRENT  . OTHER DYSPHAGIA  . TEAR LATERAL MENISCUS  . LIVER FUNCTION TESTS, ABNORMAL, HX OF  . INTERMITTENT VERTIGO  . GERD (gastroesophageal reflux disease)  . Dysphagia  . RUQ pain  . Fatty liver  . Hematochezia  . OA (osteoarthritis) of knee  . Patellar tendonitis    PT - End of Session Activity Tolerance: Patient tolerated treatment well General Behavior During Session: [facility] for tasks performed Cognition: [facility] for tasks performed  GP    Jennye Moccasin,  Ruby Cola 09/29/2012, 12:15 PM

## 2012-09-29 NOTE — Telephone Encounter (Signed)
Patient called and stated that she never received her prescription for Voltaren gel 1% from [facility] and wanted to know if we could call it into [facility] in [place]. I called prescription in as prescribed.

## 2012-10-04 ENCOUNTER — Ambulatory Visit (HOSPITAL_COMMUNITY)
Admission: RE | Admit: 2012-10-04 | Discharge: 2012-10-04 | Disposition: A | Discharge status: Home / Self Care | Payer: Medicare Other | Source: Ambulatory Visit | Attending: Orthopedic Surgery | Admitting: Orthopedic Surgery

## 2012-10-04 NOTE — Progress Notes (Signed)
Physical Therapy Treatment Patient Details  Name: Sara [name] MRN: [id number] Date of Birth: 01-27-1946  Today's Date: 10/04/2012 Time: 1100-1205 PT Time Calculation (min): 65 min  Visit#: 6  of 8   Re-eval: 10/08/12  Charge: therex 38', NMR 12', IFES x 15', ice x 1  Authorization: Medicare  Authorization Time Period:    Authorization Visit#: 6  of 10    Subjective: Symptoms/Limitations Symptoms: Pt stated no c/o... pt just woke up not long ago. Pain Assessment Currently in Pain?: No/denies  Objective:   Exercise/Treatments Standing Heel Raises: 20 reps;Limitations Heel Raises Limitations: toe raises 20 reps Lateral Step Up: Both;10 reps;Hand Hold: 1;Step Height: 4" Forward Step Up: Left;10 reps;Right;5 reps;Hand Hold: 0;Step Height: 6" Step Down: Both;10 reps;Hand Hold: 0;Step Height: 4" SLS: L 18", R 8" max of 3 Other Standing Knee Exercises: tandem stance 2x 30"; tandem gait 2RT, retro tandem gait 1RT Seated Other Seated Knee Exercises: 5 STS each foot behind no HHA Other Seated Knee Exercises: heel roll out with small ball between knees 10x 10"   Modalities Modalities: Cryotherapy;Electrical Stimulation Manual Therapy Manual Therapy: Joint mobilization Joint Mobilization: Patella mobs S/I R/L and tib/fib x 8 min for pain reduction BLE  Cryotherapy Number Minutes Cryotherapy: 15 Minutes Cryotherapy Location: Knee (R knee with estim) Type of Cryotherapy: Ice pack Pharmacologist Location: R knee Statistician Action: reduce pan Electrical Stimulation Parameters: IFES hi/lo sweep 13 v with ice and legs elevated Electrical Stimulation Goals: Pain  Physical Therapy Assessment and Plan PT Assessment and Plan Clinical Impression Statement: Pt overall functional strengthening and balance improving, increased ease with STS no HHA required.  Less assistance required with balance activities, pt able to regain LOB episodes  independently or min assist.  Able to increase to 6 in step for more normalized step training without difficulty.  Began step down for eccentric quad strengthening with quad weakness noted with new activity.  Added seated heel roll out to improve hip mobility and IR strengthening to improve gait mechanics with less ER.  Pt stated R knee pain increase to 4/10 following new activities, IFES with Ice complete at end of session with pain reduced to 1/10. PT Plan: Continue with current POC for B LE strengthening, balance and gait mechanics. Continue with manual techniques and modalities to reduce pain.    Goals    Problem List Patient Active Problem List  Diagnosis  . HYPOTHYROIDISM  . HYPERLIPIDEMIA  . OBESITY NOS  . MORBID OBESITY  . BENIGN POSITIONAL VERTIGO  . HYPERTENSION  . ALLERGIC RHINITIS, SEASONAL  . GERD  . PUD  . DIVERTICULOSIS, COLON  . OVERACTIVE BLADDER  . FIBROCYSTIC BREAST DISEASE  . OSTEOARTHRITIS  . KNEE, ARTHRITIS, DEGEN./OSTEO  . H N P-LUMBAR  . SPINAL STENOSIS, CERVICAL  . SPINAL STENOSIS, LUMBAR  . BACK PAIN  . ANSERINE BURSITIS  . Patellar tendinitis  . PALPITATIONS  . CHEST PAIN, RECURRENT  . OTHER DYSPHAGIA  . TEAR LATERAL MENISCUS  . LIVER FUNCTION TESTS, ABNORMAL, HX OF  . INTERMITTENT VERTIGO  . GERD (gastroesophageal reflux disease)  . Dysphagia  . RUQ pain  . Fatty liver  . Hematochezia  . OA (osteoarthritis) of knee  . Patellar tendonitis    PT - End of Session Activity Tolerance: Patient tolerated treatment well General Behavior During Session: [facility] for tasks performed Cognition: [facility] for tasks performed  GP    Juel Burrow 10/04/2012, 12:14 PM

## 2012-10-06 ENCOUNTER — Ambulatory Visit (HOSPITAL_COMMUNITY)
Admission: RE | Admit: 2012-10-06 | Discharge: 2012-10-06 | Disposition: A | Discharge status: Home / Self Care | Payer: Medicare Other | Source: Ambulatory Visit | Attending: Orthopedic Surgery | Admitting: Orthopedic Surgery

## 2012-10-06 NOTE — Progress Notes (Signed)
Physical Therapy Treatment Patient Details  Name: Sara [name] MRN: [id number] Date of Birth: [birth year]  Today's Date: 10/06/2012 Time: 4098-1191 PT Time Calculation (min): 47 min Chartes: 69' TE Visit#: 7  of 8   Re-eval: 10/08/12 Assessment Diagnosis: B knee pain Next MD Visit: Dr. Romeo Apple - 11/09/12  Authorization: Medicare  Authorization Time Period:    Authorization Visit#: 7  of 10    Subjective: Symptoms/Limitations Symptoms: Pt reports that she is now doing her HEP and is starting to feel a little better.  Pain Assessment Currently in Pain?: Yes Pain Score:   1 Pain Location: Knee Pain Orientation: Right;Left  Exercise/Treatments Aerobic Tread Mill: Gait trainer x 8 min .70 cyc/sec for activity tolerance Standing Heel Raises: 20 reps;Limitations Heel Raises Limitations: toe raises 20 reps Lateral Step Up: Both;10 reps;Hand Hold: 2;Step Height: 4" Forward Step Up: Both;10 reps;Hand Hold: 1;Step Height: 6" Step Down: Both;10 reps;Hand Hold: 0;Step Height: 4" Rocker Board: 2 minutes Rocker Board Limitations: R/L no HHA SLS: 3x30 sec: L: 11' sec, R: 10 sec Other Standing Knee Exercises: on balance beam: tandem gait 1RT, retro tandem gait 1RT Seated Other Seated Knee Exercises: heel roll out with small ball between knees 10x 10"    Physical Therapy Assessment and Plan PT Assessment and Plan Clinical Impression Statement: Pt able to complete all activities without increased pain today.  requires less cueing for posture with walking and stair training. Able to progress to balance beam w/min A. Reports 0/10 pain after session today to knees, some soreness to her low back PT Plan: re-eval next visit    Goals Home Exercise Program Pt will Perform Home Exercise Program: Independently PT Goal: Perform Home Exercise Program - Progress: Met PT Short Term Goals Time to Complete Short Term Goals: 4 weeks PT Short Term Goal 1: Pt will report pain less than 5/10  during the night time hours.  PT Short Term Goal 1 - Progress: Met (reports it is not waking her up like it used to) PT Short Term Goal 2: Pt will improve her patellar and R fibular head mobility and present with moderate fascial restrcitions to R LE.  PT Short Term Goal 2 - Progress: Progressing toward goal PT Short Term Goal 3: Pt will improve her LE strength by 1 muscle grade.  PT Short Term Goal 3 - Progress: Progressing toward goal PT Short Term Goal 4: Pt will demonstrate R and L SLS x10 sec on solid surface.  PT Short Term Goal 4 - Progress: Met PT Long Term Goals Time to Complete Long Term Goals: 4 weeks PT Long Term Goal 1: Pt will improve LE strength to [facility] in order to transition from STS without increased pain.  PT Long Term Goal 1 - Progress: Met PT Long Term Goal 2: Pt will improve her LEFS to 40/80 for improved percieved functional ability.  PT Long Term Goal 2 - Progress: Progressing toward goal Long Term Goal 3: Pt will improve her LE strength to WNL in order to tolerate walking and standing for greater than 30 minutes to continue with light household activities to improve QOL.  Long Term Goal 3 Progress: Progressing toward goal (able to iron and cook her dinner)  Problem List Patient Active Problem List  Diagnosis  . HYPOTHYROIDISM  . HYPERLIPIDEMIA  . OBESITY NOS  . MORBID OBESITY  . BENIGN POSITIONAL VERTIGO  . HYPERTENSION  . ALLERGIC RHINITIS, SEASONAL  . GERD  . PUD  . DIVERTICULOSIS, COLON  .  OVERACTIVE BLADDER  . FIBROCYSTIC BREAST DISEASE  . OSTEOARTHRITIS  . KNEE, ARTHRITIS, DEGEN./OSTEO  . H N P-LUMBAR  . SPINAL STENOSIS, CERVICAL  . SPINAL STENOSIS, LUMBAR  . BACK PAIN  . ANSERINE BURSITIS  . Patellar tendinitis  . PALPITATIONS  . CHEST PAIN, RECURRENT  . OTHER DYSPHAGIA  . TEAR LATERAL MENISCUS  . LIVER FUNCTION TESTS, ABNORMAL, HX OF  . INTERMITTENT VERTIGO  . GERD (gastroesophageal reflux disease)  . Dysphagia  . RUQ pain  . Fatty liver    . Hematochezia  . OA (osteoarthritis) of knee  . Patellar tendonitis    PT - End of Session Activity Tolerance: Patient tolerated treatment well General Behavior During Session: [facility] for tasks performed Cognition: [facility] for tasks performed  GP    Chaylee Ehrsam 10/06/2012, 11:55 AM

## 2012-10-11 ENCOUNTER — Ambulatory Visit (HOSPITAL_COMMUNITY)
Admission: RE | Admit: 2012-10-11 | Discharge: 2012-10-11 | Disposition: A | Discharge status: Home / Self Care | Payer: Medicare Other | Source: Ambulatory Visit | Attending: Orthopedic Surgery | Admitting: Orthopedic Surgery

## 2012-10-11 NOTE — Progress Notes (Addendum)
Physical Therapy Re-evaluation  Patient Details  Name: Sara [name] MRN: [id number] Date of Birth: 10-Oct-1945  Today's Date: 10/11/2012 Time: 4132-4401 PT Time Calculation (min): 53 min  Visit#: 8  of 16   Re-eval: 11/08/12 Assessment Diagnosis: B knee pain Next MD Visit: Dr. Romeo Apple - 11/09/12 Prior Therapy: 2-3 years ago for pain Charge: MMT x 1; self care (LEFS) x 8', therex 38'  Authorization: Medicare  Authorization Time Period: Gcode complete on 8th visit  Authorization Visit#: 8  of 18    Subjective Symptoms/Limitations Symptoms: Pt reported whole body feeling weak today from having to care for family members being sick, little pain in B knees. How long can you sit comfortably?: no difficulty. 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 How long can you walk comfortably?: 10 minutes comfortably shopping Pain Assessment Currently in Pain?: Yes Pain Score:   1 Pain Location: Knee Pain Orientation: Right;Left  Objective:   Sensation/Coordination/Flexibility/Functional Tests Functional Tests Functional Tests: LEFS 35/50 (LEFS was 12/80)  Assessment RLE Strength Right Hip Flexion: 4/5 (was 3-/5) Right Hip Extension:  (3+/5 was 3/5) Right Hip ABduction: 4/5 (was 3-/5) Right Knee Flexion: 4/5 (was 3+/5) Right Knee Extension:  (4+/5 was 3+/5) LLE Strength Left Hip Flexion:  (4+/5 was 3-/5) Left Hip Extension:  (3+/5 was 3/5) Left Hip ABduction:  (4+/5 was 3+/5) Left Knee Flexion:  (4+/5 was 4/5) Left Knee Extension: 5/5 (was 4/5)  Exercise/Treatments Aerobic Tread Mill: Gait trainer x 8 min .70 cyc/sec Standing Forward Lunges: Both;10 reps Rocker Board: 2 minutes Rocker Board Limitations: R/L no HHA SLS: L 10", R 16" max of 3 SLS with Vectors: 2x 5" each LE Other Standing Knee Exercises: side stepping with blue tband around ankles 1RT  Physical Therapy Assessment and Plan PT Assessment and  Plan Clinical Impression Statement: Reeval complete:  Pt has had 8 visits over 4 weeks with the following findings: pt has met 3/4 STGs and 1/3 LTGs.  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  Improved LE strength with ability to STS without HHA but pt does c/o pain R knee with STS.  Improved knee mobility with decreased fascial restrictions to R fibular head and patella.  Balance improving on static surface, able to SLS 10" BLE.   PT Plan: Recommend continuing OPPT for functional strengthening and balance and standing activities to improve activity tolerance for functional activities.    Goals Home Exercise Program Pt will Perform Home Exercise Program: Independently PT Goal: Perform Home Exercise Program - Progress: Met PT Short Term Goals Time to Complete Short Term Goals: 4 weeks PT Short Term Goal 1: Pt will report pain less than 5/10 during the night time hours.  PT Short Term Goal 1 - Progress: Met (reports she is not waking her up like it used to) PT Short Term Goal 2: Pt will improve her patellar and R fibular head mobility and present with moderate fascial restrcitions to R LE.  PT Short Term Goal 2 - Progress: Progressing toward goal PT Short Term Goal 3: Pt will improve her LE strength by 1 muscle grade.  PT Short Term Goal 3 - Progress: Met PT Short Term Goal 4: Pt will demonstrate R and L SLS x10 sec on solid surface.  PT Short Term Goal 4 - Progress:  Met PT Long Term Goals Time to Complete Long Term Goals: 4 weeks PT Long Term Goal 1: Pt will improve LE strength to [facility] in order to transition from STS without increased pain.  PT Long Term Goal 1 - Progress: Met PT Long Term Goal 2: Pt will improve her LEFS to 40/80 for improved percieved functional ability.  PT Long Term Goal 2 - Progress: Progressing toward goal (LEFS 35/80) Long Term Goal  3: Pt will improve her LE strength to WNL in order to tolerate walking and standing for greater than 30 minutes to continue with light household activities to improve QOL.  Long Term Goal 3 Progress: Progressing toward goal (able to iron and cook her dinner, able to walk 10' )  Problem List Patient Active Problem List  Diagnosis  . HYPOTHYROIDISM  . HYPERLIPIDEMIA  . OBESITY NOS  . MORBID OBESITY  . BENIGN POSITIONAL VERTIGO  . HYPERTENSION  . ALLERGIC RHINITIS, SEASONAL  . GERD  . PUD  . DIVERTICULOSIS, COLON  . OVERACTIVE BLADDER  . FIBROCYSTIC BREAST DISEASE  . OSTEOARTHRITIS  . KNEE, ARTHRITIS, DEGEN./OSTEO  . H N P-LUMBAR  . SPINAL STENOSIS, CERVICAL  . SPINAL STENOSIS, LUMBAR  . BACK PAIN  . ANSERINE BURSITIS  . Patellar tendinitis  . PALPITATIONS  . CHEST PAIN, RECURRENT  . OTHER DYSPHAGIA  . TEAR LATERAL MENISCUS  . LIVER FUNCTION TESTS, ABNORMAL, HX OF  . INTERMITTENT VERTIGO  . GERD (gastroesophageal reflux disease)  . Dysphagia  . RUQ pain  . Fatty liver  . Hematochezia  . OA (osteoarthritis) of knee  . Patellar tendonitis    PT - End of Session Activity Tolerance: Patient tolerated treatment well General Behavior During Session: [facility] for tasks performed Cognition: [facility] for tasks performed PT Plan of Care PT Patient Instructions: Discussed LEFS/ self care  GP Functional Assessment Tool Used: LEFS and clinicial observation Functional Limitation: Mobility: Walking and moving around Mobility: Walking and Moving Around Current Status (Z6109): At least 60 percent but less than 80 percent impaired, limited or restricted Mobility: Walking and Moving Around Goal Status [phone]): At least 20 percent but less than 40 percent impaired, limited or restricted  Juel Burrow 10/11/2012, 2:04 PM

## 2012-10-13 ENCOUNTER — Ambulatory Visit (HOSPITAL_COMMUNITY)
Admission: RE | Admit: 2012-10-13 | Discharge: 2012-10-13 | Disposition: A | Discharge status: Home / Self Care | Payer: Medicare Other | Source: Ambulatory Visit | Attending: Orthopedic Surgery | Admitting: Orthopedic Surgery

## 2012-10-13 NOTE — Progress Notes (Signed)
Physical Therapy Treatment Patient Details  Name: Sara [name] MRN: [id number] Date of Birth: 07-02-1946  Today's Date: 10/13/2012 Time: 1103-1115 PT Time Calculation (min): 12 min  Visit#: 9  of 16   Re-eval: 11/08/12  Charge: gait 10'   Authorization: Medicare  Authorization Time Period: Gcode complete on 8th visit   Authorization Visit#: 9  of 18    Subjective: Symptoms/Limitations Symptoms: "My whole body aches today",  pt stated increased pain due to cold weather today.  Pt reports she went Grocery shopping following last session, able to walk for 15' carrying basket rather than riding scooter. Pain Assessment Currently in Pain?: Yes Pain Score:   5 Pain Location: Knee (whole body aches) Pain Orientation: Right;Left  Objective:   Exercise/Treatments Aerobic Tread Mill: Gait trainer x 10 min .70 cyc/sec for increased activty tolerance   Physical Therapy Assessment and Plan PT Assessment and Plan Clinical Impression Statement: Gait mechanics improving with less cueing for posture and to increase stride lengths, increased time on TM for improved activity tolerance.  Session ended early following discussion with pt with flu like symptoms. PT Plan: Recommend continuing OPPT for functional strengthening and balance and standing activities to improve activity tolerance for functional activities    Goals    Problem List Patient Active Problem List  Diagnosis  . HYPOTHYROIDISM  . HYPERLIPIDEMIA  . OBESITY NOS  . MORBID OBESITY  . BENIGN POSITIONAL VERTIGO  . HYPERTENSION  . ALLERGIC RHINITIS, SEASONAL  . GERD  . PUD  . DIVERTICULOSIS, COLON  . OVERACTIVE BLADDER  . FIBROCYSTIC BREAST DISEASE  . OSTEOARTHRITIS  . KNEE, ARTHRITIS, DEGEN./OSTEO  . H N P-LUMBAR  . SPINAL STENOSIS, CERVICAL  . SPINAL STENOSIS, LUMBAR  . BACK PAIN  . ANSERINE BURSITIS  . Patellar tendinitis  . PALPITATIONS  . CHEST PAIN, RECURRENT  . OTHER DYSPHAGIA  . TEAR LATERAL MENISCUS   . LIVER FUNCTION TESTS, ABNORMAL, HX OF  . INTERMITTENT VERTIGO  . GERD (gastroesophageal reflux disease)  . Dysphagia  . RUQ pain  . Fatty liver  . Hematochezia  . OA (osteoarthritis) of knee  . Patellar tendonitis    PT - End of Session Activity Tolerance: Patient tolerated treatment well General Behavior During Session: [facility] for tasks performed Cognition: [facility] for tasks performed  GP    Juel Burrow 10/13/2012, 11:31 AM

## 2012-10-18 ENCOUNTER — Ambulatory Visit (HOSPITAL_COMMUNITY): Payer: Medicare Other | Admitting: Physical Therapy

## 2012-10-20 ENCOUNTER — Ambulatory Visit (HOSPITAL_COMMUNITY)
Admission: RE | Admit: 2012-10-20 | Discharge: 2012-10-20 | Disposition: A | Discharge status: Home / Self Care | Payer: Medicare Other | Source: Ambulatory Visit | Attending: Orthopedic Surgery | Admitting: Orthopedic Surgery

## 2012-10-20 NOTE — Progress Notes (Signed)
Physical Therapy Treatment Patient Details  Name: Sara [name] MRN: [id number] Date of Birth: 05-23-1946  Today's Date: 10/20/2012 Time: 1100-1148 PT Time Calculation (min): 48 min  Visit#: 10  of 16   Re-eval: 11/08/12 Assessment Diagnosis: B knee pain Next MD Visit: Dr. Romeo Apple - 11/09/12 Prior Therapy: 2-3 years ago for pain Charge: Gait 10', NMR 23', therex 15'  Authorization: Medicare  Authorization Time Period: Gcode complete on 8th visit  Authorization Visit#: 10  of 18    Subjective: Symptoms/Limitations Symptoms: Pt stated B knee feeling arthritic no real pain today.  Pt reported with big smile she cleaned her kitchen completely yesterday for 15-20 minutes with no rest breaks required and no increased pain. Pain Assessment Currently in Pain?: No/denies  Precautions/Restrictions  Precautions Precautions: None  Exercise/Treatments Aerobic Tread Mill: Gait trainer x 10 min .70 cyc/sec for increased activty tolerance Machines for Strengthening Cybex Leg Press: 2.5 Pl x 15 Standing Rocker Board: 3 minutes;1 minute;Limitations Rocker Board Limitations: R/L no HHA x 3', A/P 1 finger x 1' SLS: L 7", R 10" SLS with Vectors: 3x 5" each LE Other Standing Knee Exercises: BLE hip ext x 15; abd x 15 Other Standing Knee Exercises: balance beam tandem and retro tandem gait 1RT; standing on balance beam 1-15 on wall    Physical Therapy Assessment and Plan PT Assessment and Plan Clinical Impression Statement: Pt able to demonstrate improved gait mechanics on TM today, no cueing required.  Session focus on balance activities and strengthening for weak hip musculature noted on recent re-eval.  Pt did require min assistance with balance activities on dynamic surfaces for LOB episodes, improved with cueing for spatial awareness.  Added wall 1-15 to improve balance while reaching outside BOS, pt able to complete without difficulty.  Pt was limited by fatigue with activities, did  required 3 rest breaks this session. PT Plan: Continue with current POC to improve functional strengthening, balance and activity tolerance.  Add cybex quad and hamstring machines next session.    Goals    Problem List Patient Active Problem List  Diagnosis  . HYPOTHYROIDISM  . HYPERLIPIDEMIA  . OBESITY NOS  . MORBID OBESITY  . BENIGN POSITIONAL VERTIGO  . HYPERTENSION  . ALLERGIC RHINITIS, SEASONAL  . GERD  . PUD  . DIVERTICULOSIS, COLON  . OVERACTIVE BLADDER  . FIBROCYSTIC BREAST DISEASE  . OSTEOARTHRITIS  . KNEE, ARTHRITIS, DEGEN./OSTEO  . H N P-LUMBAR  . SPINAL STENOSIS, CERVICAL  . SPINAL STENOSIS, LUMBAR  . BACK PAIN  . ANSERINE BURSITIS  . Patellar tendinitis  . PALPITATIONS  . CHEST PAIN, RECURRENT  . OTHER DYSPHAGIA  . TEAR LATERAL MENISCUS  . LIVER FUNCTION TESTS, ABNORMAL, HX OF  . INTERMITTENT VERTIGO  . GERD (gastroesophageal reflux disease)  . Dysphagia  . RUQ pain  . Fatty liver  . Hematochezia  . OA (osteoarthritis) of knee  . Patellar tendonitis    PT - End of Session Equipment Utilized During Treatment: Gait belt Activity Tolerance: Patient tolerated treatment well General Behavior During Session: [facility] for tasks performed Cognition: [facility] for tasks performed  GP    Juel Burrow 10/20/2012, 11:58 AM

## 2012-10-25 ENCOUNTER — Ambulatory Visit (HOSPITAL_COMMUNITY)
Admission: RE | Admit: 2012-10-25 | Discharge: 2012-10-25 | Disposition: A | Discharge status: Home / Self Care | Payer: Medicare Other | Source: Ambulatory Visit | Attending: Orthopedic Surgery | Admitting: Orthopedic Surgery

## 2012-10-25 DIAGNOSIS — M25669 Stiffness of unspecified knee, not elsewhere classified: Secondary | ICD-10-CM | POA: Insufficient documentation

## 2012-10-25 DIAGNOSIS — M6281 Muscle weakness (generalized): Secondary | ICD-10-CM | POA: Insufficient documentation

## 2012-10-25 DIAGNOSIS — M25569 Pain in unspecified knee: Secondary | ICD-10-CM | POA: Insufficient documentation

## 2012-10-25 DIAGNOSIS — IMO0001 Reserved for inherently not codable concepts without codable children: Secondary | ICD-10-CM | POA: Insufficient documentation

## 2012-10-25 NOTE — Progress Notes (Signed)
Physical Therapy Treatment Patient Details  Name: DALYN KJOS MRN: [id number] Date of Birth: 02/25/1946  Today's Date: 10/25/2012 Time: 1100-1145 PT Time Calculation (min): 45 min  Visit#: 11  of 16   Re-eval: 11/08/12 Authorization: Medicare  Authorization Time Period: Gcode complete on 8th visit  Authorization Visit#: 11  of 16   Charges:  therex 42'  Subjective: Symptoms/Limitations Symptoms: Pt. states her R knee is hurting her today; thinks it may be from the rain/dampness/coldness.  Currently 5/10 in R knee. Pain Assessment Currently in Pain?: Yes Pain Score:   5 Pain Location: Knee Pain Orientation: Right  Exercise/Treatments Aerobic Tread Mill: Gait trainer x 8 min .70 cyc/sec for increased activty tolerance Machines for Strengthening Cybex Leg Press: 2.5 Pl  2 sets15 reps Standing Rocker Board: 3 minutes;1 minute;Limitations Rocker Board Limitations: R/L no HHA x 3', A/P 1 finger x 1' SLS: L 7", R 10" SLS with Vectors: 5x 5" each LE Other Standing Knee Exercises: BLE hip ext x 15; abd x 15 Other Standing Knee Exercises: balance beam tandem and retro tandem gait 2RT each Seated Other Seated Knee Exercises: 5 STS each foot behind no HHA Other Seated Knee Exercises: heel roll out with small ball between knees 10x 10"     Physical Therapy Assessment and Plan PT Assessment and Plan Clinical Impression Statement: Pt. requires tactile cues for posture /substitution with vector stance and standing hip abd/ext exercises.  Pt. able to self-correct LOB on balance beam activiity.  Pt.  R knee pain remained unchanged throughout treatment.  Pt. reports she ices at home when needed.   PT Plan: Continue with current POC to improve functional strengthening, balance and activity tolerance.  Add cybex quad and hamstring machines next session.     Problem List Patient Active Problem List  Diagnosis  . HYPOTHYROIDISM  . HYPERLIPIDEMIA  . OBESITY NOS  . MORBID OBESITY  .  BENIGN POSITIONAL VERTIGO  . HYPERTENSION  . ALLERGIC RHINITIS, SEASONAL  . GERD  . PUD  . DIVERTICULOSIS, COLON  . OVERACTIVE BLADDER  . FIBROCYSTIC BREAST DISEASE  . OSTEOARTHRITIS  . KNEE, ARTHRITIS, DEGEN./OSTEO  . H N P-LUMBAR  . SPINAL STENOSIS, CERVICAL  . SPINAL STENOSIS, LUMBAR  . BACK PAIN  . ANSERINE BURSITIS  . Patellar tendinitis  . PALPITATIONS  . CHEST PAIN, RECURRENT  . OTHER DYSPHAGIA  . TEAR LATERAL MENISCUS  . LIVER FUNCTION TESTS, ABNORMAL, HX OF  . INTERMITTENT VERTIGO  . GERD (gastroesophageal reflux disease)  . Dysphagia  . RUQ pain  . Fatty liver  . Hematochezia  . OA (osteoarthritis) of knee  . Patellar tendonitis    PT - End of Session Equipment Utilized During Treatment: Gait belt Activity Tolerance: Patient tolerated treatment well General Behavior During Session: [facility] for tasks performed Cognition: [facility] for tasks performed   Lurena Nida, PTA/CLT 10/25/2012, 12:08 PM

## 2012-10-27 ENCOUNTER — Ambulatory Visit (HOSPITAL_COMMUNITY): Payer: Medicare Other

## 2012-10-31 ENCOUNTER — Ambulatory Visit (HOSPITAL_COMMUNITY)
Admission: RE | Admit: 2012-10-31 | Discharge: 2012-10-31 | Disposition: A | Discharge status: Home / Self Care | Payer: Medicare Other | Source: Ambulatory Visit | Attending: Orthopedic Surgery | Admitting: Orthopedic Surgery

## 2012-10-31 ENCOUNTER — Inpatient Hospital Stay (HOSPITAL_COMMUNITY)
Admission: RE | Admit: 2012-10-31 | Discharge: 2012-10-31 | Disposition: A | Discharge status: Home / Self Care | Payer: Medicare Other | Source: Ambulatory Visit | Attending: Physical Therapy | Admitting: Physical Therapy

## 2012-10-31 NOTE — Progress Notes (Signed)
Physical Therapy Treatment Patient Details  Name: Sara [name] MRN: [id number] Date of Birth: 08/13/1946  Today's Date: 10/31/2012 Time: 0850-0930 PT Time Calculation (min): 40 min  Visit#: 12 of 16  Re-eval: 11/08/12 Authorization: Medicare  Authorization Time Period: Gcode complete on 8th visit  Authorization Visit#: 12 of 16  Charges:  therex 38'  Subjective: Symptoms/Limitations Symptoms: Pt. states her R knee is still hurting but without other complaints. Pain Assessment Currently in Pain?: Yes Pain Score:   5 Pain Location: Knee Pain Orientation: Right   Exercise/Treatments Aerobic Tread Mill: Gait trainer x 8 min .70 cyc/sec for increased activty tolerance Machines for Strengthening Cybex Knee Extension: add next visit Cybex Knee Flexion: add next visit Cybex Leg Press: 3 Pl  2 sets15 reps Standing Rocker Board: 3 minutes;1 minute;Limitations Rocker Board Limitations: R/L no HHA x 3', A/P 1 finger x 1' SLS: L 15", R 14" SLS with Vectors: 5x 5" each LE Other Standing Knee Exercises: BLE hip ext x 15; abd x 15 Other Standing Knee Exercises: balance beam tandem and retro tandem gait 2RT each Seated Other Seated Knee Exercises: 5 STS each foot behind no HHA      Physical Therapy Assessment and Plan PT Assessment and Plan Clinical Impression Statement: Pt. able to complete all exercises without difficulty or complaint.  Able to increase to 15" for SLS on L LE today.  Pt. overall getting stronger with increased activity tolerance. PT Plan: Continue with current POC to improve functional strengthening, balance and activity tolerance.  Add cybex quad and hamstring machines next session.     Problem List Patient Active Problem List  Diagnosis  . HYPOTHYROIDISM  . HYPERLIPIDEMIA  . OBESITY NOS  . MORBID OBESITY  . BENIGN POSITIONAL VERTIGO  . HYPERTENSION  . ALLERGIC RHINITIS, SEASONAL  . GERD  . PUD  . DIVERTICULOSIS, COLON  . OVERACTIVE BLADDER  .  FIBROCYSTIC BREAST DISEASE  . OSTEOARTHRITIS  . KNEE, ARTHRITIS, DEGEN./OSTEO  . H N P-LUMBAR  . SPINAL STENOSIS, CERVICAL  . SPINAL STENOSIS, LUMBAR  . BACK PAIN  . ANSERINE BURSITIS  . Patellar tendinitis  . PALPITATIONS  . CHEST PAIN, RECURRENT  . OTHER DYSPHAGIA  . TEAR LATERAL MENISCUS  . LIVER FUNCTION TESTS, ABNORMAL, HX OF  . INTERMITTENT VERTIGO  . GERD (gastroesophageal reflux disease)  . Dysphagia  . RUQ pain  . Fatty liver  . Hematochezia  . OA (osteoarthritis) of knee  . Patellar tendonitis    PT - End of Session Equipment Utilized During Treatment: Gait belt Activity Tolerance: Patient tolerated treatment well General Behavior During Session: [facility] for tasks performed Cognition: [facility] for tasks performed   Lurena Nida, PTA/CLT 10/31/2012, 12:20 PM

## 2012-11-01 ENCOUNTER — Ambulatory Visit (HOSPITAL_COMMUNITY)
Admission: RE | Admit: 2012-11-01 | Discharge: 2012-11-01 | Disposition: A | Discharge status: Home / Self Care | Payer: Medicare Other | Source: Ambulatory Visit | Attending: Orthopedic Surgery | Admitting: Orthopedic Surgery

## 2012-11-01 NOTE — Progress Notes (Signed)
Physical Therapy Treatment Patient Details  Name: Sara [name] MRN: [id number] Date of Birth: 1946-03-25  Today's Date: 11/01/2012 Time: 4098-1191 PT Time Calculation (min): 38 min  Visit#: 13 of 16  Re-eval: 11/08/12 Assessment Diagnosis: B knee pain Next MD Visit: Dr. Romeo Apple - 11/09/12 Prior Therapy: 2-3 years ago for pain Charge: gait 8', therex 30'  Authorization: Medicare  Authorization Time Period: Gcode complete on 8th visit  Authorization Visit#: 13 of 16   Subjective: Symptoms/Limitations Symptoms: Pt stated her R knee bothers her when no weight bearing on it, L knee is feeling good. Pain Assessment Currently in Pain?: Yes Pain Score:   6 Pain Location: Knee Pain Orientation: Right  Precautions/Restrictions  Precautions Precautions: None  Exercise/Treatments Aerobic Tread Mill: Gait trainer x 8 min .70 cyc/sec for improved gait mechanics and increased activty tolerance Machines for Strengthening Cybex Knee Extension: 1.5 Pl 10x  Cybex Knee Flexion: 3 PL 10x  Cybex Leg Press: 3 Pl  2 sets15 reps Standing Rocker Board: 3 minutes;Limitations Rocker Board Limitations: R/L HHA PRN SLS with Vectors: 5x 5" each LE Other Standing Knee Exercises: standing clam against wall 10x 5" BLE    Physical Therapy Assessment and Plan PT Assessment and Plan Clinical Impression Statement: Added standing hip musculature strengthening exercises and cybex quad and hamstring for LE strengthening, pt able to complete all exercises without difficulty.  Pt with improved gait mechanics and activity tolerance, less fatigue noted following gait training on TM at faster pace. PT Plan: Reassess in 2 mores sessions.  Continue wtih current POC to improve functional strengthening, balance and activity tolerance.      Goals    Problem List Patient Active Problem List  Diagnosis  . HYPOTHYROIDISM  . HYPERLIPIDEMIA  . OBESITY NOS  . MORBID OBESITY  . BENIGN POSITIONAL VERTIGO  .  HYPERTENSION  . ALLERGIC RHINITIS, SEASONAL  . GERD  . PUD  . DIVERTICULOSIS, COLON  . OVERACTIVE BLADDER  . FIBROCYSTIC BREAST DISEASE  . OSTEOARTHRITIS  . KNEE, ARTHRITIS, DEGEN./OSTEO  . H N P-LUMBAR  . SPINAL STENOSIS, CERVICAL  . SPINAL STENOSIS, LUMBAR  . BACK PAIN  . ANSERINE BURSITIS  . Patellar tendinitis  . PALPITATIONS  . CHEST PAIN, RECURRENT  . OTHER DYSPHAGIA  . TEAR LATERAL MENISCUS  . LIVER FUNCTION TESTS, ABNORMAL, HX OF  . INTERMITTENT VERTIGO  . GERD (gastroesophageal reflux disease)  . Dysphagia  . RUQ pain  . Fatty liver  . Hematochezia  . OA (osteoarthritis) of knee  . Patellar tendonitis    PT - End of Session Activity Tolerance: Patient tolerated treatment well General Behavior During Session: [facility] for tasks performed Cognition: [facility] for tasks performed  GP    Juel Burrow 11/01/2012, 2:15 PM

## 2012-11-03 ENCOUNTER — Ambulatory Visit (HOSPITAL_COMMUNITY): Payer: Medicare Other

## 2012-11-05 ENCOUNTER — Other Ambulatory Visit: Payer: Self-pay

## 2012-11-08 ENCOUNTER — Ambulatory Visit (HOSPITAL_COMMUNITY)
Admission: RE | Admit: 2012-11-08 | Discharge: 2012-11-08 | Disposition: A | Discharge status: Home / Self Care | Payer: Medicare Other | Source: Ambulatory Visit | Attending: Orthopedic Surgery | Admitting: Orthopedic Surgery

## 2012-11-08 NOTE — Evaluation (Signed)
Physical Therapy Discharge  Patient Details  Name: Sara [name] MRN: [id number] Date of Birth: 17-Oct-1945  Today's Date: 11/08/2012 Time: 4098-1191 PT Time Calculation (min): 28 min Charge: 1 ROM, 1 MMT, 15 Self Care, 8' Gait                      Visit#: 14 of 16  Re-eval: 11/08/12 Assessment Diagnosis: B knee pain Next MD Visit: Dr. Romeo Apple - 11/09/12  Authorization: Medicare  Authorization Time Period: Roselind Rily complete on 8th visit  Authorization Visit#: 14 of 18   Subjective Symptoms/Limitations Symptoms: Pt reports tha she is doing a whole lot better overall.  Her pain at highest is 4-5/10 and is controlled by pain medication.  Reports her greatest difficulty is with balance activities.  How long can you stand comfortably?: 20 minutes (was 20 minutes) How long can you walk comfortably?: 20 minutes (was 10 minutes) Pain Assessment Currently in Pain?: Yes Pain Score:   4 Pain Location: Abdomen Pain Orientation: Right   Sensation/Coordination/Flexibility/Functional Tests Functional Tests Functional Tests: LEFS:  (was 35/80)  Assessment RLE Strength Right Hip Flexion: 5/5 (was 4/5) Right Hip Extension: 5/5 (was 4+/5) Right Hip ABduction: 5/5 (was 4/5) Right Knee Flexion: 5/5 (was 4/5) Right Knee Extension: 5/5 (was 4+/5) LLE Strength Left Hip Flexion: 5/5 (was 4+/5) Left Hip Extension: 5/5 (was 4+/5) Left Hip ABduction: 5/5 (was 4+/5) Left Knee Flexion: 5/5 (was 4+/5) Left Knee Extension:  (was 5/5) Palpation Palpation: moderate fascial restrictions to R fibular head  Mobility/Balance  Ambulation/Gait Ambulation/Gait: Yes Gait Pattern: Within Functional Limits Static Standing Balance Single Leg Stance - Right Leg: 30 (was 2) Single Leg Stance - Left Leg: 12 (was 3)   Exercise/Treatments Aerobic Tread Mill: Gait trainer x 8 min .70 cyc/sec for improved gait mechanics and increased activty tolerance   Physical Therapy Assessment and Plan PT Assessment  and Plan Clinical Impression Statement: Ms. Pressly has attended 14 OP PT visits for R knee pain with the following findings: pain at highest 4-5/10 and controlled with pain meds, she is sleeping through the night, improved gait mechanics and strength 5/5 throughout. At this time pt has met all STG and 2/3 LTG (limited by 30 minutes of walking due to cardiovascular endurance).  Pt will be D/C from PT and f/u w/MD on 11/09/12.  PT Plan: D/C    Goals Pt will Perform Home Exercise Program: Independently: Met PT Short Term Goals: 4 weeks PT Short Term Goal 1: Pt will report pain less than 5/10 during the night time hours.: Met (she is sleeping through the night) PT Short Term Goal 2: Pt will improve her patellar and R fibular head mobility and present with moderate fascial restrcitions to R LE.: Met PT Short Term Goal 3: Pt will improve her LE strength by 1 muscle grade. : Met PT Short Term Goal 4: Pt will demonstrate R and L SLS x10 sec on solid surface.: Met PT Long Term Goals:  (6 weeks) PT Long Term Goal 1: Pt will improve LE strength to [facility] in order to transition from STS without increased pain.: Met PT Long Term Goal 2: Pt will improve her LEFS to 40/80 for improved percieved functional ability.: Met Long Term Goal 3: Pt will improve her LE strength to WNL in order to tolerate walking and standing for greater than 30 minutes to continue with light household activities to improve QOL.: Progressing toward goal  Problem List Patient Active Problem List  Diagnosis  .  HYPOTHYROIDISM  . HYPERLIPIDEMIA  . OBESITY NOS  . MORBID OBESITY  . BENIGN POSITIONAL VERTIGO  . HYPERTENSION  . ALLERGIC RHINITIS, SEASONAL  . GERD  . PUD  . DIVERTICULOSIS, COLON  . OVERACTIVE BLADDER  . FIBROCYSTIC BREAST DISEASE  . OSTEOARTHRITIS  . KNEE, ARTHRITIS, DEGEN./OSTEO  . H N P-LUMBAR  . SPINAL STENOSIS, CERVICAL  . SPINAL STENOSIS, LUMBAR  . BACK PAIN  . ANSERINE BURSITIS  . Patellar tendinitis  .  PALPITATIONS  . CHEST PAIN, RECURRENT  . OTHER DYSPHAGIA  . TEAR LATERAL MENISCUS  . LIVER FUNCTION TESTS, ABNORMAL, HX OF  . INTERMITTENT VERTIGO  . GERD (gastroesophageal reflux disease)  . Dysphagia  . RUQ pain  . Fatty liver  . Hematochezia  . OA (osteoarthritis) of knee  . Patellar tendonitis    PT Plan of Care PT Patient Instructions: discussed D/C Consulted and Agree with Plan of Care: Patient  GP Functional Assessment Tool Used: LEFS 46/80 Functional Limitation: Mobility: Walking and moving around Mobility: Walking and Moving Around Goal Status [phone]): At least 20 percent but less than 40 percent impaired, limited or restricted Mobility: Walking and Moving Around Discharge Status [phone]): At least 20 percent but less than 40 percent impaired, limited or restricted  Rubena Roseman, PT 11/08/2012, 11:54 AM  Physician Documentation Your signature is required to indicate approval of the treatment plan as stated above.  Please sign and either send electronically or make a copy of this report for your files and return this physician signed original.   Please mark one 1.__approve of plan  2. ___approve of plan with the following conditions.   ______________________________                                                          _____________________ Physician Signature                                                                                                             Date

## 2012-11-09 ENCOUNTER — Ambulatory Visit (INDEPENDENT_AMBULATORY_CARE_PROVIDER_SITE_OTHER): Payer: Medicare Other | Admitting: Orthopedic Surgery

## 2012-11-09 VITALS — Ht 66.0 in | Wt 249.0 lb

## 2012-11-09 DIAGNOSIS — M7650 Patellar tendinitis, unspecified knee: Secondary | ICD-10-CM

## 2012-11-09 NOTE — Progress Notes (Signed)
Patient ID: Sara [name], female   DOB: 12-Dec-1945, 67 y.o.   MRN: [id number] Chief Complaint  Patient presents with  . Follow-up    8 week recheck on bilateral knees after PT.    The patient reports major improvement after physical therapy she still has some anterior knee pain some anterior knee crepitance but she's overall made significant improvement in therapy notes show that her range of motion is good and her balance and strength has improved  Recommend continue home exercise program follow up as needed.

## 2012-11-09 NOTE — Patient Instructions (Addendum)
Continue exercises program at home

## 2012-11-10 ENCOUNTER — Ambulatory Visit (HOSPITAL_COMMUNITY): Payer: Medicare Other | Admitting: Physical Therapy

## 2012-11-15 ENCOUNTER — Ambulatory Visit (HOSPITAL_COMMUNITY): Payer: Medicare Other

## 2012-11-17 ENCOUNTER — Ambulatory Visit (HOSPITAL_COMMUNITY): Payer: Medicare Other | Admitting: Physical Therapy

## 2013-04-08 ENCOUNTER — Encounter (HOSPITAL_COMMUNITY): Payer: Self-pay | Admitting: *Deleted

## 2013-04-08 ENCOUNTER — Emergency Department (HOSPITAL_COMMUNITY)
Admission: EM | Admit: 2013-04-08 | Discharge: 2013-04-09 | Disposition: A | Discharge status: Home / Self Care | Payer: Medicare Other | Attending: Emergency Medicine | Admitting: Emergency Medicine

## 2013-04-08 ENCOUNTER — Emergency Department (HOSPITAL_COMMUNITY): Payer: Medicare Other

## 2013-04-08 DIAGNOSIS — M1710 Unilateral primary osteoarthritis, unspecified knee: Secondary | ICD-10-CM | POA: Insufficient documentation

## 2013-04-08 DIAGNOSIS — Z8719 Personal history of other diseases of the digestive system: Secondary | ICD-10-CM | POA: Insufficient documentation

## 2013-04-08 DIAGNOSIS — Z91040 Latex allergy status: Secondary | ICD-10-CM | POA: Insufficient documentation

## 2013-04-08 DIAGNOSIS — Z8639 Personal history of other endocrine, nutritional and metabolic disease: Secondary | ICD-10-CM | POA: Insufficient documentation

## 2013-04-08 DIAGNOSIS — Z8679 Personal history of other diseases of the circulatory system: Secondary | ICD-10-CM | POA: Insufficient documentation

## 2013-04-08 DIAGNOSIS — H81399 Other peripheral vertigo, unspecified ear: Secondary | ICD-10-CM | POA: Insufficient documentation

## 2013-04-08 DIAGNOSIS — Z862 Personal history of diseases of the blood and blood-forming organs and certain disorders involving the immune mechanism: Secondary | ICD-10-CM | POA: Insufficient documentation

## 2013-04-08 DIAGNOSIS — Z791 Long term (current) use of non-steroidal anti-inflammatories (NSAID): Secondary | ICD-10-CM | POA: Insufficient documentation

## 2013-04-08 DIAGNOSIS — Z87448 Personal history of other diseases of urinary system: Secondary | ICD-10-CM | POA: Insufficient documentation

## 2013-04-08 DIAGNOSIS — G4700 Insomnia, unspecified: Secondary | ICD-10-CM | POA: Insufficient documentation

## 2013-04-08 DIAGNOSIS — R079 Chest pain, unspecified: Secondary | ICD-10-CM | POA: Insufficient documentation

## 2013-04-08 DIAGNOSIS — Z8669 Personal history of other diseases of the nervous system and sense organs: Secondary | ICD-10-CM | POA: Insufficient documentation

## 2013-04-08 DIAGNOSIS — R0602 Shortness of breath: Secondary | ICD-10-CM | POA: Insufficient documentation

## 2013-04-08 DIAGNOSIS — E890 Postprocedural hypothyroidism: Secondary | ICD-10-CM | POA: Insufficient documentation

## 2013-04-08 DIAGNOSIS — I10 Essential (primary) hypertension: Secondary | ICD-10-CM | POA: Insufficient documentation

## 2013-04-08 DIAGNOSIS — Z79899 Other long term (current) drug therapy: Secondary | ICD-10-CM | POA: Insufficient documentation

## 2013-04-08 DIAGNOSIS — G8929 Other chronic pain: Secondary | ICD-10-CM | POA: Insufficient documentation

## 2013-04-08 DIAGNOSIS — Z8711 Personal history of peptic ulcer disease: Secondary | ICD-10-CM | POA: Insufficient documentation

## 2013-04-08 DIAGNOSIS — Z8709 Personal history of other diseases of the respiratory system: Secondary | ICD-10-CM | POA: Insufficient documentation

## 2013-04-08 DIAGNOSIS — M19019 Primary osteoarthritis, unspecified shoulder: Secondary | ICD-10-CM | POA: Insufficient documentation

## 2013-04-08 DIAGNOSIS — R209 Unspecified disturbances of skin sensation: Secondary | ICD-10-CM | POA: Insufficient documentation

## 2013-04-08 DIAGNOSIS — Z8739 Personal history of other diseases of the musculoskeletal system and connective tissue: Secondary | ICD-10-CM | POA: Insufficient documentation

## 2013-04-08 DIAGNOSIS — E7800 Pure hypercholesterolemia, unspecified: Secondary | ICD-10-CM | POA: Insufficient documentation

## 2013-04-08 DIAGNOSIS — H538 Other visual disturbances: Secondary | ICD-10-CM | POA: Insufficient documentation

## 2013-04-08 DIAGNOSIS — R110 Nausea: Secondary | ICD-10-CM | POA: Insufficient documentation

## 2013-04-08 DIAGNOSIS — Z87891 Personal history of nicotine dependence: Secondary | ICD-10-CM | POA: Insufficient documentation

## 2013-04-08 DIAGNOSIS — K219 Gastro-esophageal reflux disease without esophagitis: Secondary | ICD-10-CM | POA: Insufficient documentation

## 2013-04-08 LAB — BASIC METABOLIC PANEL
BUN: 14 mg/dL (ref 6–23)
CO2: 29 mEq/L (ref 19–32)
Calcium: 9.6 mg/dL (ref 8.4–10.5)
Chloride: 96 mEq/L (ref 96–112)
Creatinine, Ser: 0.8 mg/dL (ref 0.50–1.10)
GFR calc Af Amer: 87 mL/min — ABNORMAL LOW (ref >90)
GFR calc non Af Amer: 75 mL/min — ABNORMAL LOW (ref >90)
Glucose, Bld: 85 mg/dL (ref 70–99)
Potassium: 3.3 mEq/L — ABNORMAL LOW (ref 3.5–5.1)
Sodium: 135 mEq/L (ref 135–145)

## 2013-04-08 LAB — CBC WITH DIFFERENTIAL/PLATELET
Basophils Absolute: 0 10*3/uL (ref 0.0–0.1)
Basophils Relative: 0 % (ref 0–1)
Eosinophils Absolute: 0.2 10*3/uL (ref 0.0–0.7)
Eosinophils Relative: 2 % (ref 0–5)
HCT: 40.8 % (ref 36.0–46.0)
Hemoglobin: 13.7 g/dL (ref 12.0–15.0)
Lymphocytes Relative: 32 % (ref 12–46)
Lymphs Abs: 2.9 10*3/uL (ref 0.7–4.0)
MCH: 31 pg (ref 26.0–34.0)
MCHC: 33.6 g/dL (ref 30.0–36.0)
MCV: 92.3 fL (ref 78.0–100.0)
Monocytes Absolute: 0.7 10*3/uL (ref 0.1–1.0)
Monocytes Relative: 7 % (ref 3–12)
Neutro Abs: 5.5 10*3/uL (ref 1.7–7.7)
Neutrophils Relative %: 59 % (ref 43–77)
Platelets: 220 10*3/uL (ref 150–400)
RBC: 4.42 MIL/uL (ref 3.87–5.11)
RDW: 13.5 % (ref 11.5–15.5)
WBC: 9.3 10*3/uL (ref 4.0–10.5)

## 2013-04-08 LAB — TROPONIN I: Troponin I: <0.3 ng/mL (ref <0.30)

## 2013-04-08 MED ORDER — POTASSIUM CHLORIDE CRYS ER 20 MEQ PO TBCR
20.0000 meq | EXTENDED_RELEASE_TABLET | Freq: Once | ORAL | Status: AC
  Administered 2013-04-08: 20 meq via ORAL
  Filled 2013-04-08: qty 1

## 2013-04-08 MED ORDER — ASPIRIN 81 MG PO CHEW
324.0000 mg | CHEWABLE_TABLET | Freq: Once | ORAL | Status: AC
  Administered 2013-04-08: 324 mg via ORAL
  Filled 2013-04-08: qty 4

## 2013-04-08 NOTE — ED Notes (Signed)
Pt alert & oriented x4, stable gait. Patient given discharge instructions, paperwork & prescription(s). Patient  instructed to stop at the registration desk to finish any additional paperwork. Patient verbalized understanding. Pt left department w/ no further questions. 

## 2013-04-08 NOTE — ED Provider Notes (Signed)
History  This chart was scribed for Raeford Razor, MD, by Candelaria Stagers, ED Scribe. This patient was seen in room APA18/APA18 and the patient's care was started at 8:50 PM  CSN: [id number] Arrival date & time 04/08/13  2025  First MD Initiated Contact with Patient 04/08/13 2036     Chief Complaint  Patient presents with  . Chest Pain    central chest   The history is provided by the patient. No language interpreter was used.   HPI Comments: Sara [name] is a 67 y.o. female who presents to the Emergency Department complaining of intermittent chest pain that started over the last week and became worse today which radiates to her neck.  Pt reports onset of chest pain while at rest.  Pt reports she has experienced about three episodes this week each time lasting several minutes, last episode about 40 minutes ago.  She describes the pain as a "hammer" to the chest.  Pt reports she has also experienced tingling to the left hand and fingers, blurred vision, nausea, and SOB during the episodes of pain.  She denies diaphoresis, leg pain, fever.  She has experienced chest pain before but states that today's pain is worse.  Pt has a family h/o heart problems with both of her sisters experiencing a heart attack at age 67.  Pt had a stress test in 2009.      PCP Dr. Loleta Chance  Past Medical History  Diagnosis Date  . Thyroid disease   . Chronic back pain   . Reflux   . Unspecified arthropathy, shoulder region   . Pure hypercholesterolemia     takes Pravastin daily  . Allergic rhinitis due to pollen   . Cervicalgia   . Diverticulosis of colon (without mention of hemorrhage)   . Morbid obesity   . Palpitations   . Peptic ulcer, unspecified site, unspecified as acute or chronic, without mention of hemorrhage, perforation, or obstruction   . Peripheral vertigo, unspecified     takes Meclizine prn  . Reflux esophagitis   . Spinal stenosis in cervical region   . 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 Personal history of tobacco use, presenting hazards to health   . Insomnia, unspecified   . Lumbago   . Right leg pain   . Shortness of breath   . H/O hiatal hernia   . HTN (hypertension)     takes Metoprolol and Lisinopril daily  . Hypertension   . Sleep apnea     slight but doesn't require a CPAP  . History of bronchitis     > 59yr ago  . Seasonal allergies     uses Flonase daily  . Headache(784.0)     denies migraines since the 80's but has occ and takes Butalbital prn  . Cervical spondylosis without myelopathy     all over  . Degeneration of cervical intervertebral disc   . Primary localized osteoarthrosis, lower leg   . GERD (gastroesophageal reflux disease)     takes Omeprazole daily  . Constipation   . Diverticulosis   . History of bladder infections   . Urinary incontinence     occasionally  . Other postablative hypothyroidism     takes SYnthroid daily  . Insomnia     takes Ativan nigtly   Past Surgical History  Procedure Laterality Date  . Knee surgery      arthroscopy--  R  . Tonsillectomy    . Appendectomy    . Colonoscopy  Sept 2008    RMR: normal rectum, left-sided diverticula, repeat in 2018  . Abdominal hysterectomy    . Upper gastrointestinal endoscopy    . Breast lumpectomy      left   . Eye surgery      cataracts removed- bilateral, /w IOL  . Lumpectomy on pelvis    . Diagnostic laparoscopy    . Tubal ligation    . Esophagogastroduodenoscopy    . Anterior lat lumbar fusion  05/13/2012    Procedure: ANTERIOR LATERAL LUMBAR FUSION 2 LEVELS;  Surgeon: Reinaldo Meeker, MD;  Location: [facility];  Service: Neurosurgery;  Laterality: Left;  Left Lumbar Three-four,Lumbar four-five Extreme Lumbar Interbody Fusion with Percutaneous Pedicle Screws  . Spinal fusion     Family History  Problem Relation Age of Onset  . Colon cancer Neg Hx    History  Substance Use Topics  .  Smoking status: Former Smoker -- 1.00 packs/day for 27 years    Types: Cigarettes  . Smokeless tobacco: Former Neurosurgeon    Quit date: 10/02/1985  . Alcohol Use: Yes     Comment: occasional wine(red)   OB History   Grav Para Term Preterm Abortions TAB SAB Ect Mult Living                 Review of Systems  Constitutional: Negative for diaphoresis.  Eyes: Positive for visual disturbance (blurred vision).  Respiratory: Positive for shortness of breath.   Cardiovascular: Positive for chest pain.  Gastrointestinal: Positive for nausea.  Neurological:       Tingling to the left hand  All other systems reviewed and are negative.    Allergies  Darvon; Propoxyphene hcl; Other; Tomato; and Latex  Home Medications   Current Outpatient Rx  Name  Route  Sig  Dispense  Refill  . HYDROcodone-acetaminophen (NORCO) 10-325 MG per tablet   Oral   Take 1 tablet by mouth every 6 (six) hours as needed for pain.         Marland Kitchen diclofenac sodium (VOLTAREN) 1 % GEL   Topical   Apply 2 g topically 4 (four) times daily.   5 Tube   3   . fluticasone (FLONASE) 50 MCG/ACT nasal spray   Nasal   Place 2 sprays into the nose at bedtime.          . gabapentin (NEURONTIN) 600 MG tablet   Oral   Take 300 mg by mouth at bedtime.          Marland Kitchen levothyroxine (SYNTHROID, LEVOTHROID) 88 MCG tablet   Oral   Take 88 mcg by mouth daily before breakfast.          . LORazepam (ATIVAN) 1 MG tablet   Oral   Take 1 mg by mouth at bedtime as needed. For sleep         . losartan-hydrochlorothiazide (HYZAAR) 100-12.5 MG per tablet   Oral   Take 1 tablet by mouth daily.          . meclizine (ANTIVERT) 25 MG tablet   Oral   Take 25 mg by mouth 4 (four) times daily as needed. For nausea         . metoprolol tartrate (LOPRESSOR) 25 MG tablet   Oral   Take 25 mg by mouth 2 (two) times daily.          Marland Kitchen omeprazole (PRILOSEC) 40 MG  capsule   Oral   Take 40 mg by mouth daily before breakfast.           . pravastatin (PRAVACHOL) 40 MG tablet   Oral   Take 40 mg by mouth daily with breakfast.           BP 135/77  Pulse 61  Temp(Src) 98.3 F (36.8 C) (Oral)  Resp 17  Ht 5\' 6"  (1.676 m)  Wt 220 lb (99.791 kg)  BMI 35.53 kg/m2  SpO2 99% Physical Exam  Nursing note and vitals reviewed. Constitutional: She is oriented to person, place, and time. She appears well-developed and well-nourished. No distress.  HENT:  Head: Normocephalic and atraumatic.  Eyes: EOM are normal.  Neck: Neck supple. No tracheal deviation present.  Cardiovascular: Normal rate.   Pulmonary/Chest: Effort normal. No respiratory distress.  Musculoskeletal: Normal range of motion.  Lower extremities symmetric as compared to each other. No calf tenderness. Negative Homan's. No palpable cords.   Neurological: She is alert and oriented to person, place, and time.  Skin: Skin is warm and dry.  Psychiatric: She has a normal mood and affect. Her behavior is normal.    ED Course  Procedures   DIAGNOSTIC STUDIES: Oxygen Saturation is 99% on room air, normal by my interpretation.    COORDINATION OF CARE:  8:56 PM Discussed course of care with pt which includes.  Pt understands and agrees.   Labs Reviewed  BASIC METABOLIC PANEL - Abnormal; Notable for the following:    Potassium 3.3 (*)    GFR calc non Af Amer 75 (*)    GFR calc Af Amer 87 (*)    All other components within normal limits  CBC WITH DIFFERENTIAL  TROPONIN I   Dg Chest 2 View  04/08/2013   *RADIOLOGY REPORT*  Clinical Data: Chest pain and weakness; arm numbness for 1 week.  CHEST - 2 VIEW  Comparison: Chest radiograph performed 12/11/2011  Findings: The lungs are well-aerated.  Minimal bibasilar opacities may reflect atelectasis.  There is no evidence of pleural effusion or pneumothorax.  The heart is normal in size; the mediastinal contour is within normal limits.  No acute osseous abnormalities are seen.  IMPRESSION: Minimal bibasilar  opacities may reflect atelectasis; lungs otherwise clear.   Original Report Authenticated By: Tonia Ghent, M.D.   EKG:  Rhythm: sinus bradycardia Vent. rate 55 BPM PR interval 162 ms QRS duration 84 ms QT/QTc 412/394 ms LVH ST segments: NS ST changes   1. Chest pain     MDM  67yf with CP. Doubt ACS, infectious, dissection, PE or other emergent etiology.  I personally preformed the services scribed in my presence. The recorded information has been reviewed is accurate. Raeford Razor, MD.    Raeford Razor, MD 04/13/13 2228

## 2013-04-08 NOTE — ED Notes (Signed)
Pt has had centralized chest pain three times this week, this episode started tonight, describes pain as crushing tightness in center of chest. Pt states she also has tingling in lt hand fingers.

## 2013-04-24 ENCOUNTER — Encounter: Payer: Self-pay | Admitting: *Deleted

## 2013-04-25 ENCOUNTER — Ambulatory Visit (INDEPENDENT_AMBULATORY_CARE_PROVIDER_SITE_OTHER): Payer: Medicare Other | Admitting: Cardiovascular Disease

## 2013-04-25 ENCOUNTER — Encounter: Payer: Self-pay | Admitting: Cardiovascular Disease

## 2013-04-25 ENCOUNTER — Encounter: Payer: Self-pay | Admitting: *Deleted

## 2013-04-25 VITALS — BP 120/72 | HR 81 | Resp 18 | Ht 66.0 in | Wt 222.0 lb

## 2013-04-25 DIAGNOSIS — I10 Essential (primary) hypertension: Secondary | ICD-10-CM

## 2013-04-25 DIAGNOSIS — E785 Hyperlipidemia, unspecified: Secondary | ICD-10-CM

## 2013-04-25 DIAGNOSIS — R079 Chest pain, unspecified: Secondary | ICD-10-CM

## 2013-04-25 DIAGNOSIS — R9431 Abnormal electrocardiogram [ECG] [EKG]: Secondary | ICD-10-CM

## 2013-04-25 DIAGNOSIS — R0789 Other chest pain: Secondary | ICD-10-CM | POA: Insufficient documentation

## 2013-04-25 NOTE — Assessment & Plan Note (Signed)
Previous normal myovue 2009  Cannot walk on treadmill due to back and knee pain.  Abnormal ECG  F/U lexiscan myovue and echo to assess LVH and EF

## 2013-04-25 NOTE — Progress Notes (Signed)
Patient ID: Sara [name], female   DOB: [birth year], 67 y.o.   MRN: [id number] 67 yo patient of Dr Daleen Squibb seen in 2011 at that time she had dizzyness with no w/u indicated and Dr Daleen Squibb felt tilt table not needed. Carotids normal  Cleared for lumbar fusion in 2013  She saw [facility] in 2009 and had a normal echo and normal pharmacologic myovue with EF 80%. Nondiabetic. On Rx for HTN and lipids. Activity limited by knee and back pain. Seen in ER 7/19 for chest pain and referred by Dr Loleta Chance for evaluation   Intermittent chest pain that started 04/01/13 which radiates to her neck. Pt reports onset of chest pain while at rest. Pt reports she has experienced about three episodes that  week each time lasting several minutes, last episode about 40 minutes ago. She describes the pain as a "hammer" to the chest. Pt reports she has also experienced tingling to the left hand and fingers, blurred vision, nausea, and SOB during the episodes of pain. She denies diaphoresis, leg pain, fever. She has experienced chest pain before but states that  pain is worse. Pt has a family h/o heart problems with both of her sisters experiencing a heart attack at age 51. Pt had a stress test in 2009.   Pain thought not to be cardiac r/o and discharged  Since then continues to have atypical chest and left shoulder pains mostly at rest   ROS: Denies fever, malais, weight loss, blurry vision, decreased visual acuity, cough, sputum, SOB, hemoptysis, pleuritic pain, palpitaitons, heartburn, abdominal pain, melena, lower extremity edema, claudication, or rash.  All other systems reviewed and negative  General: Affect appropriate Healthy:  appears stated age HEENT: normal Neck supple with no adenopathy JVP normal no bruits no thyromegaly Lungs clear with no wheezing and good diaphragmatic motion Heart:  S1/S2 no murmur, no rub, gallop or click PMI normal Abdomen: benighn, BS positve, no tenderness, no AAA no bruit.  No HSM or HJR Distal  pulses intact with no bruits No edema Neuro non-focal Skin warm and dry No muscular weakness   Current Outpatient Prescriptions  Medication Sig Dispense Refill  . diclofenac sodium (VOLTAREN) 1 % GEL Apply 2 g topically 4 (four) times daily.  5 Tube  3  . fluticasone (FLONASE) 50 MCG/ACT nasal spray Place 2 sprays into the nose at bedtime.       . gabapentin (NEURONTIN) 600 MG tablet Take 300 mg by mouth at bedtime.       Marland Kitchen HYDROcodone-acetaminophen (NORCO) 10-325 MG per tablet Take 1 tablet by mouth every 6 (six) hours as needed for pain.      Marland Kitchen levothyroxine (SYNTHROID, LEVOTHROID) 88 MCG tablet Take 88 mcg by mouth daily before breakfast.       . LORazepam (ATIVAN) 1 MG tablet Take 1 mg by mouth at bedtime as needed. For sleep      . losartan-hydrochlorothiazide (HYZAAR) 100-12.5 MG per tablet Take 1 tablet by mouth daily.       . meclizine (ANTIVERT) 25 MG tablet Take 25 mg by mouth 4 (four) times daily as needed. For nausea      . metoprolol tartrate (LOPRESSOR) 25 MG tablet Take 25 mg by mouth 2 (two) times daily.       Marland Kitchen omeprazole (PRILOSEC) 40 MG capsule Take 40 mg by mouth daily before breakfast.       . pravastatin (PRAVACHOL) 40 MG tablet Take 40 mg by mouth daily with breakfast.       . [  DISCONTINUED] Ranitidine HCl (ZANTAC 75 PO) Take by mouth.         No current facility-administered medications for this visit.    Allergies  Darvon; Propoxyphene hcl; Other; Tomato; and Latex  Electrocardiogram: 7/22  SR LVH poor R wave progression inferolateral T wave changes  No change from 7/19  Assessment and Plan

## 2013-04-25 NOTE — Patient Instructions (Addendum)
Your physician recommends that you schedule a follow-up appointment in: ONE YEAR WITH PN  Your physician has requested that you have a lexiscan myoview. For further information please visit [email or web address] Please follow instruction sheet, as given.  Your physician has requested that you have an echocardiogram. Echocardiography is a painless test that uses sound waves to create images of your heart. It provides your doctor with information about the size and shape of your heart and how well your heart's chambers and valves are working. This procedure takes approximately one hour. There are no restrictions for this procedure.

## 2013-04-25 NOTE — Assessment & Plan Note (Signed)
Well controlled.  Continue current medications and low sodium Dash type diet.    

## 2013-04-25 NOTE — Assessment & Plan Note (Signed)
Cholesterol is at goal.  Continue current dose of statin and diet Rx.  No myalgias or side effects.  F/U  LFT's in 6 months. Lab Results  Component Value Date   LDLCALC 105* 08/10/2008

## 2013-05-03 ENCOUNTER — Encounter (HOSPITAL_COMMUNITY): Payer: Self-pay

## 2013-05-03 ENCOUNTER — Encounter (HOSPITAL_COMMUNITY)
Admission: RE | Admit: 2013-05-03 | Discharge: 2013-05-03 | Disposition: A | Discharge status: Home / Self Care | Payer: Medicare Other | Source: Ambulatory Visit | Attending: Cardiovascular Disease | Admitting: Cardiovascular Disease

## 2013-05-03 ENCOUNTER — Ambulatory Visit (HOSPITAL_COMMUNITY)
Admission: RE | Admit: 2013-05-03 | Discharge: 2013-05-03 | Disposition: A | Discharge status: Home / Self Care | Payer: Medicare Other | Source: Ambulatory Visit | Attending: Cardiovascular Disease | Admitting: Cardiovascular Disease

## 2013-05-03 DIAGNOSIS — E785 Hyperlipidemia, unspecified: Secondary | ICD-10-CM | POA: Insufficient documentation

## 2013-05-03 DIAGNOSIS — R079 Chest pain, unspecified: Secondary | ICD-10-CM

## 2013-05-03 DIAGNOSIS — I10 Essential (primary) hypertension: Secondary | ICD-10-CM

## 2013-05-03 DIAGNOSIS — R9431 Abnormal electrocardiogram [ECG] [EKG]: Secondary | ICD-10-CM

## 2013-05-03 DIAGNOSIS — R072 Precordial pain: Secondary | ICD-10-CM

## 2013-05-03 DIAGNOSIS — R42 Dizziness and giddiness: Secondary | ICD-10-CM | POA: Insufficient documentation

## 2013-05-03 MED ORDER — TECHNETIUM TC 99M SESTAMIBI - CARDIOLITE
30.0000 | Freq: Once | INTRAVENOUS | Status: AC | PRN
  Administered 2013-05-03: 30 via INTRAVENOUS

## 2013-05-03 MED ORDER — TECHNETIUM TC 99M SESTAMIBI - CARDIOLITE
10.0000 | Freq: Once | INTRAVENOUS | Status: AC | PRN
  Administered 2013-05-03: 10:00:00 11 via INTRAVENOUS

## 2013-05-03 MED ORDER — REGADENOSON 0.4 MG/5ML IV SOLN
INTRAVENOUS | Status: AC
  Administered 2013-05-03: 0.4 mg via INTRAVENOUS
  Filled 2013-05-03: qty 5

## 2013-05-03 MED ORDER — SODIUM CHLORIDE 0.9 % IJ SOLN
INTRAMUSCULAR | Status: AC
  Administered 2013-05-03: 10 mL via INTRAVENOUS
  Filled 2013-05-03: qty 10

## 2013-05-03 NOTE — Progress Notes (Signed)
Stress Lab Nurses Notes - [facility]  Sara [name] 05/03/2013 Reason for doing test: Chest Pain Type of test: Marlane Hatcher Nurse performing test: Parke Poisson, RN Nuclear Medicine Tech: Lou Cal Echo Tech: Not Applicable MD performing test: Ival Bible & Jacolyn Reedy PA Family MD: [facility] Test explained and consent signed: yes IV started: 22g jelco, Saline lock flushed, No redness or edema and Saline lock started in radiology Symptoms: Dizziness Treatment/Intervention: None Reason test stopped: protocol completed After recovery IV was: Discontinued via X-ray tech and No redness or edema Patient to return to Nuc. Med at : 12:30 Patient discharged: Home Patient's Condition upon discharge was: stable Comments: During test BP 112/60 & HR 87.  Recovery BP 120/64 & HR 72.  Symptoms resolved in recovery. Erskine Speed T

## 2013-05-03 NOTE — Progress Notes (Signed)
*  PRELIMINARY RESULTS* Echocardiogram 2D Echocardiogram has been performed.  Sara [name] 05/03/2013, 1:30 PM

## 2013-06-05 ENCOUNTER — Other Ambulatory Visit (HOSPITAL_COMMUNITY): Payer: Self-pay | Admitting: Family Medicine

## 2013-06-05 DIAGNOSIS — Z139 Encounter for screening, unspecified: Secondary | ICD-10-CM

## 2013-07-13 ENCOUNTER — Ambulatory Visit (HOSPITAL_COMMUNITY): Payer: Medicare Other

## 2013-07-14 ENCOUNTER — Ambulatory Visit (HOSPITAL_COMMUNITY)
Admission: RE | Admit: 2013-07-14 | Discharge: 2013-07-14 | Disposition: A | Discharge status: Home / Self Care | Payer: Medicare Other | Source: Ambulatory Visit | Attending: Family Medicine | Admitting: Family Medicine

## 2013-07-14 DIAGNOSIS — Z1231 Encounter for screening mammogram for malignant neoplasm of breast: Secondary | ICD-10-CM | POA: Insufficient documentation

## 2013-07-14 DIAGNOSIS — Z139 Encounter for screening, unspecified: Secondary | ICD-10-CM

## 2013-07-19 ENCOUNTER — Other Ambulatory Visit: Payer: Self-pay | Admitting: Family Medicine

## 2013-07-19 DIAGNOSIS — R928 Other abnormal and inconclusive findings on diagnostic imaging of breast: Secondary | ICD-10-CM

## 2013-07-25 ENCOUNTER — Ambulatory Visit (INDEPENDENT_AMBULATORY_CARE_PROVIDER_SITE_OTHER): Payer: PRIVATE HEALTH INSURANCE

## 2013-07-25 ENCOUNTER — Ambulatory Visit (INDEPENDENT_AMBULATORY_CARE_PROVIDER_SITE_OTHER): Payer: PRIVATE HEALTH INSURANCE | Admitting: Orthopedic Surgery

## 2013-07-25 ENCOUNTER — Encounter: Payer: Self-pay | Admitting: Orthopedic Surgery

## 2013-07-25 VITALS — BP 102/62 | Ht 66.0 in | Wt 249.0 lb

## 2013-07-25 DIAGNOSIS — M25369 Other instability, unspecified knee: Secondary | ICD-10-CM | POA: Insufficient documentation

## 2013-07-25 DIAGNOSIS — M238X9 Other internal derangements of unspecified knee: Secondary | ICD-10-CM

## 2013-07-25 DIAGNOSIS — M25361 Other instability, right knee: Secondary | ICD-10-CM

## 2013-07-25 DIAGNOSIS — IMO0002 Reserved for concepts with insufficient information to code with codable children: Secondary | ICD-10-CM

## 2013-07-25 DIAGNOSIS — M25569 Pain in unspecified knee: Secondary | ICD-10-CM

## 2013-07-25 DIAGNOSIS — M5410 Radiculopathy, site unspecified: Secondary | ICD-10-CM | POA: Insufficient documentation

## 2013-07-25 DIAGNOSIS — M25561 Pain in right knee: Secondary | ICD-10-CM

## 2013-07-25 NOTE — Patient Instructions (Addendum)
Mri right knee ordered

## 2013-07-25 NOTE — Progress Notes (Signed)
Patient ID: Sara [name], female   DOB: [birth year], 67 y.o.   MRN: [id number]  Chief Complaint  Patient presents with  . Knee Pain    Right knee pain locking and catching. Consult from Dr. Loleta Chance    HISTORY:  The patient comes in complaining of right knee pain with giving out symptoms of the right knee despite no history of trauma. She's been followed in the past for patellar tendinitis and seems to continue with symptoms related to that. She had an anterior lumbar fusion which was apparently successful.  She says she can be walking straight ahead and the knee will feel like it wants to give out it has given out on several occasions.  The past, family history and social history have been reviewed and are recorded in the corresponding sections of epic   She is currently on hydrocodone 10 mg for pain. Her review of systems is essentially normal for any acute processes regarding the chest respiratory GI GU tract  She does have some residual neurologic symptoms in the right leg with pain along the side of the leg into the dorsum of the foot  BP 102/62  Ht 5\' 6"  (1.676 m)  Wt 249 lb (112.946 kg)  BMI 40.21 kg/m2 General appearance is normal, the patient is alert and oriented x3 with normal mood and affect. Her knee is stable no effusion full range of motion she does have tenderness over the patellar tendon mild tenderness over the medial joint line the knee feels stable anteroposterior and mediolateral motor function normal skin intact chest good distal pulse she does have tenderness along the anterior compartment of the lower leg and she has  pain straight leg raise at 45 in the back of the leg  She is ambulatory without assistive device  No weakness detected in the foot  X-ray of the knee shows mild degenerative changes  Impression instability right knee  Radicular symptoms right leg   Recommend MRI to rule out knee ligament problem. The ligaments are stable and intact recommend  return to neurosurgery and/or neurology for further workup of her neurologic symptoms

## 2013-07-27 ENCOUNTER — Other Ambulatory Visit: Payer: Self-pay

## 2013-08-04 ENCOUNTER — Ambulatory Visit (HOSPITAL_COMMUNITY)
Admission: RE | Admit: 2013-08-04 | Discharge: 2013-08-04 | Disposition: A | Discharge status: Home / Self Care | Payer: PRIVATE HEALTH INSURANCE | Source: Ambulatory Visit | Attending: Orthopedic Surgery | Admitting: Orthopedic Surgery

## 2013-08-04 ENCOUNTER — Encounter (HOSPITAL_COMMUNITY): Payer: Self-pay

## 2013-08-04 DIAGNOSIS — X58XXXA Exposure to other specified factors, initial encounter: Secondary | ICD-10-CM | POA: Insufficient documentation

## 2013-08-04 DIAGNOSIS — M1710 Unilateral primary osteoarthritis, unspecified knee: Secondary | ICD-10-CM | POA: Insufficient documentation

## 2013-08-04 DIAGNOSIS — M25561 Pain in right knee: Secondary | ICD-10-CM

## 2013-08-04 DIAGNOSIS — M25361 Other instability, right knee: Secondary | ICD-10-CM

## 2013-08-04 DIAGNOSIS — M25469 Effusion, unspecified knee: Secondary | ICD-10-CM | POA: Insufficient documentation

## 2013-08-04 DIAGNOSIS — M25569 Pain in unspecified knee: Secondary | ICD-10-CM | POA: Insufficient documentation

## 2013-08-04 DIAGNOSIS — M2340 Loose body in knee, unspecified knee: Secondary | ICD-10-CM | POA: Insufficient documentation

## 2013-08-04 DIAGNOSIS — M2240 Chondromalacia patellae, unspecified knee: Secondary | ICD-10-CM | POA: Insufficient documentation

## 2013-08-04 DIAGNOSIS — S83289A Other tear of lateral meniscus, current injury, unspecified knee, initial encounter: Secondary | ICD-10-CM | POA: Insufficient documentation

## 2013-08-09 ENCOUNTER — Other Ambulatory Visit: Payer: Self-pay | Admitting: Family Medicine

## 2013-08-09 ENCOUNTER — Ambulatory Visit (HOSPITAL_COMMUNITY)
Admission: RE | Admit: 2013-08-09 | Discharge: 2013-08-09 | Disposition: A | Discharge status: Home / Self Care | Payer: PRIVATE HEALTH INSURANCE | Source: Ambulatory Visit | Attending: Family Medicine | Admitting: Family Medicine

## 2013-08-09 DIAGNOSIS — R921 Mammographic calcification found on diagnostic imaging of breast: Secondary | ICD-10-CM

## 2013-08-09 DIAGNOSIS — R928 Other abnormal and inconclusive findings on diagnostic imaging of breast: Secondary | ICD-10-CM | POA: Insufficient documentation

## 2013-08-11 ENCOUNTER — Ambulatory Visit
Admission: RE | Admit: 2013-08-11 | Discharge: 2013-08-11 | Disposition: A | Discharge status: Home / Self Care | Payer: PRIVATE HEALTH INSURANCE | Source: Ambulatory Visit | Attending: Family Medicine | Admitting: Family Medicine

## 2013-08-11 DIAGNOSIS — R921 Mammographic calcification found on diagnostic imaging of breast: Secondary | ICD-10-CM

## 2013-08-24 ENCOUNTER — Telehealth: Payer: Self-pay | Admitting: Orthopedic Surgery

## 2013-08-24 NOTE — Telephone Encounter (Signed)
Patient called to follow up about MRI results of right knee, Sara [name]; also, asking if there any other recommendations, such as a knee brace?  Please advise.  Patient ph# [phone].

## 2013-08-24 NOTE — Telephone Encounter (Signed)
Routing to Dr Harrison 

## 2013-08-28 NOTE — Telephone Encounter (Signed)
Patient called back today inquiring about MRI results of right knee. Please advise.

## 2013-09-07 NOTE — Telephone Encounter (Signed)
Per Dr. Mort Sawyers note- " Tell mrs Snuffer her meniscus is torn and she needs surgery Ask her when she can have it done and when i can call her to discuss the arthroscopic surgery" I informed patient of results.

## 2013-09-11 NOTE — Telephone Encounter (Signed)
09/11/13 Patient called back following receipt of MRI results per chart note 09/07/13.  She relayed this information back to me, that she has a tear, and was given a tentative surgery date of 09/05/13 for arthroscopy -- this is pending.  She is also stating that her opposite knee is really hurting, and is asking if she may have an injection, or would this be a problem if she goes ahead with surgery? Please advise.   Her home ph# is [phone], and cell ph# is [phone].

## 2013-09-18 ENCOUNTER — Telehealth: Payer: Self-pay | Admitting: Orthopedic Surgery

## 2013-09-18 ENCOUNTER — Other Ambulatory Visit: Payer: Self-pay | Admitting: *Deleted

## 2013-09-18 NOTE — Telephone Encounter (Signed)
The patient will have arthroscopic surgery the right knee partial lateral meniscectomy debridement with removal of this body  She'll also have a left knee injection for PES bursitis

## 2013-09-21 HISTORY — PX: BREAST BIOPSY: SHX20

## 2013-09-25 ENCOUNTER — Telehealth: Payer: Self-pay | Admitting: Orthopedic Surgery

## 2013-09-25 NOTE — Telephone Encounter (Signed)
Office notes 08/04/13 + MRI report, and surgery schedule information faxed to primary care provider Dr [name], fax [phone], ph# [phone], for continuity of care.

## 2013-09-27 ENCOUNTER — Encounter (HOSPITAL_COMMUNITY): Payer: Self-pay | Admitting: Pharmacy Technician

## 2013-10-02 ENCOUNTER — Encounter (HOSPITAL_COMMUNITY)
Admission: RE | Admit: 2013-10-02 | Discharge: 2013-10-02 | Disposition: A | Discharge status: Home / Self Care | Payer: PRIVATE HEALTH INSURANCE | Source: Ambulatory Visit | Attending: Orthopedic Surgery | Admitting: Orthopedic Surgery

## 2013-10-02 ENCOUNTER — Telehealth: Payer: Self-pay | Admitting: Orthopedic Surgery

## 2013-10-02 ENCOUNTER — Encounter (HOSPITAL_COMMUNITY): Payer: Self-pay

## 2013-10-02 DIAGNOSIS — Z01812 Encounter for preprocedural laboratory examination: Secondary | ICD-10-CM | POA: Insufficient documentation

## 2013-10-02 DIAGNOSIS — Z01818 Encounter for other preprocedural examination: Secondary | ICD-10-CM | POA: Insufficient documentation

## 2013-10-02 LAB — BASIC METABOLIC PANEL
BUN: 10 mg/dL (ref 6–23)
CO2: 28 mEq/L (ref 19–32)
Calcium: 8.9 mg/dL (ref 8.4–10.5)
Chloride: 101 mEq/L (ref 96–112)
Creatinine, Ser: 0.84 mg/dL (ref 0.50–1.10)
GFR calc Af Amer: 82 mL/min — ABNORMAL LOW (ref >90)
GFR calc non Af Amer: 71 mL/min — ABNORMAL LOW (ref >90)
Glucose, Bld: 94 mg/dL (ref 70–99)
Potassium: 4 mEq/L (ref 3.7–5.3)
Sodium: 141 mEq/L (ref 137–147)

## 2013-10-02 LAB — HEMOGLOBIN AND HEMATOCRIT, BLOOD
HCT: 37.9 % (ref 36.0–46.0)
Hemoglobin: 13.3 g/dL (ref 12.0–15.0)

## 2013-10-02 NOTE — Telephone Encounter (Signed)
Regarding out-patient surgery scheduled 10/06/13 at [facility], CPT codes [phone] and [phone], no pre-authorization is required per Medicare (primary insurer) guidelines.  Regarding Medicaid (secondary insurer) per [employer], no pre-authorization required.

## 2013-10-02 NOTE — Patient Instructions (Signed)
Sara [name]  10/02/2013   Your procedure is scheduled on:   10/06/2013  Report to [facility] at  3  AM.  Call this number if you have problems the morning of surgery: [phone]   Remember:   Do not eat food or drink liquids after midnight.   Take these medicines the morning of surgery with A SIP OF WATER:  Hydrocodone, voltaren, neurontin, synthroid, hyzaar, lopresor, prilosec.   Do not wear jewelry, make-up or nail polish.  Do not wear lotions, powders, or perfumes.   Do not shave 48 hours prior to surgery. Men may shave face and neck.  Do not bring valuables to the hospital.  [facility] is not responsible for any belongings or valuables.               Contacts, dentures or bridgework may not be worn into surgery.  Leave suitcase in the car. After surgery it may be brought to your room.  For patients admitted to the hospital, discharge time is determined by your treatment team.               Patients discharged the day of surgery will not be allowed to drive home.  Name and phone number of your driver: family  Special Instructions: Shower using CHG 2 nights before surgery and the night before surgery.  If you shower the day of surgery use CHG.  Use special wash - you have one bottle of CHG for all showers.  You should use approximately 1/3 of the bottle for each shower.   Please read over the following fact sheets that you were given: Pain Booklet, Coughing and Deep Breathing, Surgical Site Infection Prevention, Anesthesia Post-op Instructions and Care and Recovery After Surgery Arthroscopic Procedure, Knee An arthroscopic procedure can find what is wrong with your knee. PROCEDURE Arthroscopy is a surgical technique that allows your orthopedic surgeon to diagnose and treat your knee injury with accuracy. They will look into your knee through a small instrument. This is almost like a small (pencil sized) telescope. Because arthroscopy affects your knee less than open  knee surgery, you can anticipate a more rapid recovery. Taking an active role by following your caregiver's instructions will help with rapid and complete recovery. Use crutches, rest, elevation, ice, and knee exercises as instructed. The length of recovery depends on various factors including type of injury, age, physical condition, medical conditions, and your rehabilitation. Your knee is the joint between the large bones (femur and tibia) in your leg. Cartilage covers these bone ends which are smooth and slippery and allow your knee to bend and move smoothly. Two menisci, thick, semi-lunar shaped pads of cartilage which form a rim inside the joint, help absorb shock and stabilize your knee. Ligaments bind the bones together and support your knee joint. Muscles move the joint, help support your knee, and take stress off the joint itself. Because of this all programs and physical therapy to rehabilitate an injured or repaired knee require rebuilding and strengthening your muscles. AFTER THE PROCEDURE  After the procedure, you will be moved to a recovery area until most of the effects of the medication have worn off. Your caregiver will discuss the test results with you.  Only take over-the-counter or prescription medicines for pain, discomfort, or fever as directed by your caregiver. SEEK MEDICAL CARE IF:   You have increased bleeding from your wounds.  You see redness, swelling, or have increasing pain in your wounds.  You have pus coming from your wound.  You have an oral temperature above 102 F (38.9 C).  You notice a bad smell coming from the wound or dressing.  You have severe pain with any motion of your knee. SEEK IMMEDIATE MEDICAL CARE IF:   You develop a rash.  You have difficulty breathing.  You have any allergic problems. Document Released: 09/04/2000 Document Revised: 11/30/2011 Document Reviewed: 03/28/2008 [facility] Patient Information 2014 [place]. PATIENT  INSTRUCTIONS POST-ANESTHESIA  IMMEDIATELY FOLLOWING SURGERY:  Do not drive or operate machinery for the first twenty four hours after surgery.  Do not make any important decisions for twenty four hours after surgery or while taking narcotic pain medications or sedatives.  If you develop intractable nausea and vomiting or a severe headache please notify your doctor immediately.  FOLLOW-UP:  Please make an appointment with your surgeon as instructed. You do not need to follow up with anesthesia unless specifically instructed to do so.  WOUND CARE INSTRUCTIONS (if applicable):  Keep a dry clean dressing on the anesthesia/puncture wound site if there is drainage.  Once the wound has quit draining you may leave it open to air.  Generally you should leave the bandage intact for twenty four hours unless there is drainage.  If the epidural site drains for more than 36-48 hours please call the anesthesia department.  QUESTIONS?:  Please feel free to call your physician or the hospital operator if you have any questions, and they will be happy to assist you.

## 2013-10-05 NOTE — H&P (Signed)
Chief Complaint   Patient presents with   .  Knee Pain       Right knee pain locking and catching. Consult from Dr. Berdine Addison     HISTORY:  The patient comes in complaining of right knee pain with giving out symptoms of the right knee despite no history of trauma. She's been followed in the past for patellar tendinitis and seems to continue with symptoms related to that. She had an anterior lumbar fusion which was apparently successful.  She says she can be walking straight ahead and the knee will feel like it wants to give out it has given out on several occasions.  The past, family history and social history have been reviewed and are recorded in the corresponding sections of epic   Past Medical History  Diagnosis Date  . Thyroid disease   . Chronic back pain   . Reflux   . Unspecified arthropathy, shoulder region   . Pure hypercholesterolemia     takes Pravastin daily  . Allergic rhinitis due to pollen   . Cervicalgia   . Diverticulosis of colon (without mention of hemorrhage)   . Morbid obesity   . Palpitations   . Peptic ulcer, unspecified site, unspecified as acute or chronic, without mention of hemorrhage, perforation, or obstruction   . Peripheral vertigo, unspecified     takes Meclizine prn  . Reflux esophagitis   . Spinal stenosis in cervical region   . 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 Personal history of tobacco use, presenting hazards to health   . Insomnia, unspecified   . Lumbago   . Right leg pain   . Shortness of breath   . H/O hiatal hernia   . HTN (hypertension)     takes Metoprolol and Lisinopril daily  . Hypertension   . Sleep apnea     slight but doesn't require a CPAP  . History of bronchitis     > 2yr ago  . Seasonal allergies     uses Flonase daily  . Headache(784.0)     denies migraines since the 80's but has occ and takes Butalbital prn  . Cervical spondylosis without  myelopathy     all over  . Degeneration of cervical intervertebral disc   . Primary localized osteoarthrosis, lower leg   . GERD (gastroesophageal reflux disease)     takes Omeprazole daily  . Constipation   . Diverticulosis   . History of bladder infections   . Urinary incontinence     occasionally  . Other postablative hypothyroidism     takes SYnthroid daily  . Insomnia     takes Ativan nigtly    Past Surgical History  Procedure Laterality Date  . Knee surgery      arthroscopy-- R  . Tonsillectomy    . Appendectomy    . Colonoscopy  Sept 2008    RMR: normal rectum, left-sided diverticula, repeat in 2018  . Abdominal hysterectomy    . Upper gastrointestinal endoscopy    . Breast lumpectomy      left   . Eye surgery      cataracts removed- bilateral, /w IOL  . Lumpectomy on pelvis    . Diagnostic laparoscopy    . Tubal ligation    . Esophagogastroduodenoscopy    . Anterior lat lumbar fusion  05/13/2012    Procedure: ANTERIOR LATERAL LUMBAR FUSION 2 LEVELS;  Surgeon: Olga Coaster  Kritzer, MD;  Location: [place] [facility];  Service: Neurosurgery;  Laterality: Left;  Left Lumbar Three-four,Lumbar four-five Extreme Lumbar Interbody Fusion with Percutaneous Pedicle Screws  . Spinal fusion      History  Substance Use Topics  . Smoking status: Former Smoker -- 1.00 packs/day for 27 years    Types: Cigarettes  . Smokeless tobacco: Former Systems developer    Quit date: 10/02/1985  . Alcohol Use: Yes     Comment: occasional wine(red)    Family History  Problem Relation Age of Onset  . Colon cancer Neg Hx      She is currently on hydrocodone 10 mg for pain. Her review of systems is essentially normal for any acute processes regarding the chest respiratory GI GU tract  She does have some residual neurologic symptoms in the right leg with pain along the side of the leg into the dorsum of the foot  BP 102/62  Ht 5\' 6"  (1.676 m)  Wt 249 lb (112.946 kg)  BMI 40.21 kg/m2 General appearance  is normal, the patient is alert and oriented x3 with normal mood and affect. Her knee is stable no effusion full range of motion she does have tenderness over the patellar tendon mild tenderness over the medial joint line the knee feels stable anteroposterior and mediolateral motor function normal skin intact chest good distal pulse she does have tenderness along the anterior compartment of the lower leg and she has  pain straight leg raise at 45 in the back of the leg  She is ambulatory without assistive device  No weakness detected in the foot  X-ray of the knee shows mild degenerative changes  MRI  IMPRESSION: 1. Horizontal tear of the body of the lateral meniscus with propagation into the anterior and posterior horns. Lateral meniscal extrusion. Severe lateral compartment osteoarthritis. 2. Mild to moderate patellofemoral osteoarthritis with moderate medial compartment osteoarthritis. 3. 6 mm loose body in the medial patellofemoral recess and small joint effusion.  PLAN arthroscopy right knee partial lateral meniscectomy/SARK, LM  Addendum the patient also has PES bursitis of the left knee and we will do a cortisone injection of the left knee for bursitis

## 2013-10-06 ENCOUNTER — Encounter (HOSPITAL_COMMUNITY): Payer: PRIVATE HEALTH INSURANCE | Admitting: Anesthesiology

## 2013-10-06 ENCOUNTER — Ambulatory Visit (HOSPITAL_COMMUNITY)
Admission: RE | Admit: 2013-10-06 | Discharge: 2013-10-06 | Disposition: A | Discharge status: Home / Self Care | Payer: PRIVATE HEALTH INSURANCE | Source: Ambulatory Visit | Attending: Orthopedic Surgery | Admitting: Orthopedic Surgery

## 2013-10-06 ENCOUNTER — Encounter (HOSPITAL_COMMUNITY)
Admission: RE | Disposition: A | Discharge status: Home / Self Care | Payer: Self-pay | Source: Ambulatory Visit | Attending: Orthopedic Surgery

## 2013-10-06 ENCOUNTER — Ambulatory Visit (HOSPITAL_COMMUNITY): Payer: PRIVATE HEALTH INSURANCE | Admitting: Anesthesiology

## 2013-10-06 DIAGNOSIS — M23302 Other meniscus derangements, unspecified lateral meniscus, unspecified knee: Secondary | ICD-10-CM

## 2013-10-06 DIAGNOSIS — M2340 Loose body in knee, unspecified knee: Secondary | ICD-10-CM | POA: Insufficient documentation

## 2013-10-06 DIAGNOSIS — M76899 Other specified enthesopathies of unspecified lower limb, excluding foot: Secondary | ICD-10-CM | POA: Insufficient documentation

## 2013-10-06 DIAGNOSIS — IMO0002 Reserved for concepts with insufficient information to code with codable children: Secondary | ICD-10-CM | POA: Insufficient documentation

## 2013-10-06 DIAGNOSIS — M1990 Unspecified osteoarthritis, unspecified site: Secondary | ICD-10-CM

## 2013-10-06 DIAGNOSIS — I10 Essential (primary) hypertension: Secondary | ICD-10-CM | POA: Insufficient documentation

## 2013-10-06 DIAGNOSIS — M1710 Unilateral primary osteoarthritis, unspecified knee: Secondary | ICD-10-CM | POA: Insufficient documentation

## 2013-10-06 DIAGNOSIS — M23329 Other meniscus derangements, posterior horn of medial meniscus, unspecified knee: Secondary | ICD-10-CM

## 2013-10-06 HISTORY — PX: FOREIGN BODY REMOVAL: SHX962

## 2013-10-06 HISTORY — PX: KNEE ARTHROSCOPY WITH LATERAL MENISECTOMY: SHX6193

## 2013-10-06 HISTORY — PX: INJECTION KNEE: SHX2446

## 2013-10-06 SURGERY — ARTHROSCOPY, KNEE, WITH LATERAL MENISCECTOMY
Anesthesia: General | Site: Knee | Laterality: Right

## 2013-10-06 MED ORDER — DEXAMETHASONE SODIUM PHOSPHATE 4 MG/ML IJ SOLN
INTRAMUSCULAR | Status: AC
  Filled 2013-10-06: qty 2

## 2013-10-06 MED ORDER — PROMETHAZINE HCL 12.5 MG PO TABS: 12.5000 mg | ORAL_TABLET | Freq: Four times a day (QID) | ORAL | Status: DC | PRN

## 2013-10-06 MED ORDER — FENTANYL CITRATE 0.05 MG/ML IJ SOLN
INTRAMUSCULAR | Status: AC
  Filled 2013-10-06: qty 2

## 2013-10-06 MED ORDER — MIDAZOLAM HCL 2 MG/2ML IJ SOLN
1.0000 mg | INTRAMUSCULAR | Status: DC | PRN
  Administered 2013-10-06 (×2): 1 mg via INTRAVENOUS

## 2013-10-06 MED ORDER — ONDANSETRON HCL 4 MG/2ML IJ SOLN
4.0000 mg | Freq: Once | INTRAMUSCULAR | Status: AC
  Administered 2013-10-06: 4 mg via INTRAVENOUS

## 2013-10-06 MED ORDER — GLYCOPYRROLATE 0.2 MG/ML IJ SOLN
INTRAMUSCULAR | Status: DC | PRN
  Administered 2013-10-06: 0.4 mg via INTRAVENOUS

## 2013-10-06 MED ORDER — CHLORHEXIDINE GLUCONATE 4 % EX LIQD: 60.0000 mL | Freq: Once | CUTANEOUS | Status: DC

## 2013-10-06 MED ORDER — LIDOCAINE HCL (PF) 1 % IJ SOLN
INTRAMUSCULAR | Status: AC
  Filled 2013-10-06: qty 30

## 2013-10-06 MED ORDER — DEXAMETHASONE SODIUM PHOSPHATE 10 MG/ML IJ SOLN
INTRAMUSCULAR | Status: DC | PRN
  Administered 2013-10-06: 8 mg via INTRAVENOUS

## 2013-10-06 MED ORDER — LIDOCAINE HCL (PF) 1 % IJ SOLN
INTRAMUSCULAR | Status: DC | PRN
  Administered 2013-10-06: 5 mL

## 2013-10-06 MED ORDER — PROPOFOL 10 MG/ML IV BOLUS
INTRAVENOUS | Status: DC | PRN
  Administered 2013-10-06: 30 mg via INTRAVENOUS
  Administered 2013-10-06: 20 mg via INTRAVENOUS
  Administered 2013-10-06: 150 mg via INTRAVENOUS

## 2013-10-06 MED ORDER — ROCURONIUM BROMIDE 50 MG/5ML IV SOLN
INTRAVENOUS | Status: AC
  Filled 2013-10-06: qty 1

## 2013-10-06 MED ORDER — PROPOFOL 10 MG/ML IV BOLUS
INTRAVENOUS | Status: AC
  Filled 2013-10-06: qty 20

## 2013-10-06 MED ORDER — KETOROLAC TROMETHAMINE 30 MG/ML IJ SOLN
INTRAMUSCULAR | Status: AC
  Filled 2013-10-06: qty 1

## 2013-10-06 MED ORDER — GLYCOPYRROLATE 0.2 MG/ML IJ SOLN
INTRAMUSCULAR | Status: AC
  Filled 2013-10-06: qty 1

## 2013-10-06 MED ORDER — ONDANSETRON HCL 4 MG/2ML IJ SOLN
INTRAMUSCULAR | Status: AC
  Filled 2013-10-06: qty 2

## 2013-10-06 MED ORDER — METHYLPREDNISOLONE ACETATE 40 MG/ML IJ SUSP
40.0000 mg | Freq: Once | INTRAMUSCULAR | Status: AC
  Administered 2013-10-06: 40 mg via INTRA_ARTICULAR
  Filled 2013-10-06: qty 1

## 2013-10-06 MED ORDER — BUPIVACAINE-EPINEPHRINE PF 0.5-1:200000 % IJ SOLN
INTRAMUSCULAR | Status: AC
  Filled 2013-10-06: qty 20

## 2013-10-06 MED ORDER — HYDROCODONE-ACETAMINOPHEN 5-325 MG PO TABS
ORAL_TABLET | ORAL | Status: AC
  Filled 2013-10-06: qty 1

## 2013-10-06 MED ORDER — HYDROCODONE-ACETAMINOPHEN 5-325 MG PO TABS
1.0000 | ORAL_TABLET | Freq: Once | ORAL | Status: AC
  Administered 2013-10-06: 1 via ORAL

## 2013-10-06 MED ORDER — NEOSTIGMINE METHYLSULFATE 1 MG/ML IJ SOLN
INTRAMUSCULAR | Status: DC | PRN
  Administered 2013-10-06: 2 mg via INTRAVENOUS

## 2013-10-06 MED ORDER — OXYCODONE-ACETAMINOPHEN 5-325 MG PO TABS: 1.0000 | ORAL_TABLET | ORAL | Status: DC | PRN

## 2013-10-06 MED ORDER — CEFAZOLIN SODIUM-DEXTROSE 2-3 GM-% IV SOLR
2.0000 g | INTRAVENOUS | Status: AC
  Administered 2013-10-06: 2 g via INTRAVENOUS

## 2013-10-06 MED ORDER — SUCCINYLCHOLINE CHLORIDE 20 MG/ML IJ SOLN
INTRAMUSCULAR | Status: DC | PRN
  Administered 2013-10-06: 140 mg via INTRAVENOUS

## 2013-10-06 MED ORDER — FENTANYL CITRATE 0.05 MG/ML IJ SOLN
INTRAMUSCULAR | Status: DC | PRN
  Administered 2013-10-06 (×2): 50 ug via INTRAVENOUS

## 2013-10-06 MED ORDER — ROCURONIUM BROMIDE 100 MG/10ML IV SOLN
INTRAVENOUS | Status: DC | PRN
  Administered 2013-10-06: 25 mg via INTRAVENOUS
  Administered 2013-10-06: 5 mg via INTRAVENOUS

## 2013-10-06 MED ORDER — LIDOCAINE HCL (CARDIAC) 10 MG/ML IV SOLN
INTRAVENOUS | Status: DC | PRN
  Administered 2013-10-06: 20 mg via INTRAVENOUS

## 2013-10-06 MED ORDER — MIDAZOLAM HCL 2 MG/2ML IJ SOLN
INTRAMUSCULAR | Status: AC
  Filled 2013-10-06: qty 2

## 2013-10-06 MED ORDER — LACTATED RINGERS IV SOLN
INTRAVENOUS | Status: DC
  Administered 2013-10-06: 1000 mL via INTRAVENOUS
  Administered 2013-10-06: 12:00:00 via INTRAVENOUS

## 2013-10-06 MED ORDER — EPINEPHRINE HCL 1 MG/ML IJ SOLN
INTRAMUSCULAR | Status: AC
  Filled 2013-10-06: qty 5

## 2013-10-06 MED ORDER — SUCCINYLCHOLINE CHLORIDE 20 MG/ML IJ SOLN
INTRAMUSCULAR | Status: AC
  Filled 2013-10-06: qty 1

## 2013-10-06 MED ORDER — BUPIVACAINE IN DEXTROSE 0.75-8.25 % IT SOLN
INTRATHECAL | Status: AC
Start: 2013-10-06 — End: 2013-10-06
  Filled 2013-10-06: qty 4

## 2013-10-06 MED ORDER — NEOSTIGMINE METHYLSULFATE 1 MG/ML IJ SOLN
INTRAMUSCULAR | Status: AC
  Filled 2013-10-06: qty 1

## 2013-10-06 MED ORDER — KETOROLAC TROMETHAMINE 30 MG/ML IJ SOLN
30.0000 mg | Freq: Once | INTRAMUSCULAR | Status: AC
  Administered 2013-10-06: 30 mg via INTRAVENOUS

## 2013-10-06 MED ORDER — BUPIVACAINE-EPINEPHRINE (PF) 0.5% -1:200000 IJ SOLN
INTRAMUSCULAR | Status: DC | PRN
  Administered 2013-10-06: 60 mL

## 2013-10-06 MED ORDER — FENTANYL CITRATE 0.05 MG/ML IJ SOLN
25.0000 ug | INTRAMUSCULAR | Status: DC | PRN
  Administered 2013-10-06 (×2): 50 ug via INTRAVENOUS

## 2013-10-06 MED ORDER — ONDANSETRON HCL 4 MG/2ML IJ SOLN: 4.0000 mg | Freq: Once | INTRAMUSCULAR | Status: DC | PRN

## 2013-10-06 MED ORDER — CEFAZOLIN SODIUM-DEXTROSE 2-3 GM-% IV SOLR
INTRAVENOUS | Status: AC
  Filled 2013-10-06: qty 50

## 2013-10-06 MED ORDER — EPINEPHRINE HCL 1 MG/ML IJ SOLN
INTRAMUSCULAR | Status: DC | PRN
  Administered 2013-10-06 (×6)

## 2013-10-06 SURGICAL SUPPLY — 72 items
ARTHROWAND PARAGON T2 (SURGICAL WAND)
BAG HAMPER (MISCELLANEOUS) ×3 IMPLANT
BANDAGE ELASTIC 3 VELCRO ST LF (GAUZE/BANDAGES/DRESSINGS) ×3 IMPLANT
BANDAGE ELASTIC 4 VELCRO NS (GAUZE/BANDAGES/DRESSINGS) ×1 IMPLANT
BANDAGE ELASTIC 6 VELCRO NS (GAUZE/BANDAGES/DRESSINGS) ×3 IMPLANT
BANDAGE ESMARK 4X12 BL STRL LF (DISPOSABLE) ×2 IMPLANT
BANDAGE GAUZE ELAST BULKY 4 IN (GAUZE/BANDAGES/DRESSINGS) ×1 IMPLANT
BLADE AGGRESSIVE PLUS 4.0 (BLADE) ×3 IMPLANT
BLADE SURG SZ11 CARB STEEL (BLADE) ×3 IMPLANT
BNDG CMPR 12X4 ELC STRL LF (DISPOSABLE) ×2
BNDG COHESIVE 4X5 TAN NS LF (GAUZE/BANDAGES/DRESSINGS) IMPLANT
BNDG ESMARK 4X12 BLUE STRL LF (DISPOSABLE) ×3
CHLORAPREP W/TINT 26ML (MISCELLANEOUS) ×6 IMPLANT
CLOTH BEACON ORANGE TIMEOUT ST (SAFETY) ×3 IMPLANT
COOLER CRYO IC GRAV AND TUBE (ORTHOPEDIC SUPPLIES) ×3 IMPLANT
COVER LIGHT HANDLE STERIS (MISCELLANEOUS) ×6 IMPLANT
COVER PROBE W GEL 5X96 (DRAPES) ×3 IMPLANT
CUFF CRYO KNEE LG 20X31 COOLER (ORTHOPEDIC SUPPLIES) IMPLANT
CUFF CRYO KNEE18X23 MED (MISCELLANEOUS) ×1 IMPLANT
CUFF TOURNIQUET SINGLE 34IN LL (TOURNIQUET CUFF) ×1 IMPLANT
DECANTER SPIKE VIAL GLASS SM (MISCELLANEOUS) ×6 IMPLANT
DRSG XEROFORM 1X8 (GAUZE/BANDAGES/DRESSINGS) ×3 IMPLANT
GAUZE SPONGE 4X4 16PLY XRAY LF (GAUZE/BANDAGES/DRESSINGS) ×3 IMPLANT
GAUZE XEROFORM 5X9 LF (GAUZE/BANDAGES/DRESSINGS) ×3 IMPLANT
GLOVE BIOGEL PI IND STRL 7.0 (GLOVE) IMPLANT
GLOVE BIOGEL PI INDICATOR 7.0 (GLOVE) ×3
GLOVE SKINSENSE NS SZ6.5 (GLOVE) ×1
GLOVE SKINSENSE NS SZ7.0 (GLOVE) ×1
GLOVE SKINSENSE NS SZ8.0 LF (GLOVE) ×1
GLOVE SKINSENSE STRL SZ6.5 (GLOVE) IMPLANT
GLOVE SKINSENSE STRL SZ7.0 (GLOVE) IMPLANT
GLOVE SKINSENSE STRL SZ8.0 LF (GLOVE) ×2 IMPLANT
GLOVE SS N UNI LF 8.5 STRL (GLOVE) ×3 IMPLANT
GOWN STRL REUS W/TWL LRG LVL3 (GOWN DISPOSABLE) ×9 IMPLANT
GOWN STRL REUS W/TWL XL LVL3 (GOWN DISPOSABLE) ×3 IMPLANT
HLDR LEG FOAM (MISCELLANEOUS) ×2 IMPLANT
IV NS IRRIG 3000ML ARTHROMATIC (IV SOLUTION) ×10 IMPLANT
KIT BLADEGUARD II DBL (SET/KITS/TRAYS/PACK) ×3 IMPLANT
KIT ROOM TURNOVER AP CYSTO (KITS) ×3 IMPLANT
KIT ROOM TURNOVER APOR (KITS) ×3 IMPLANT
LEG HOLDER FOAM (MISCELLANEOUS) ×1
MANIFOLD NEPTUNE II (INSTRUMENTS) ×3 IMPLANT
MARKER SKIN DUAL TIP RULER LAB (MISCELLANEOUS) ×3 IMPLANT
NDL HYPO 18GX1.5 BLUNT FILL (NEEDLE) ×2 IMPLANT
NDL HYPO 21X1.5 SAFETY (NEEDLE) ×2 IMPLANT
NDL SPNL 18GX3.5 QUINCKE PK (NEEDLE) ×2 IMPLANT
NEEDLE HYPO 18GX1.5 BLUNT FILL (NEEDLE) ×6 IMPLANT
NEEDLE HYPO 21X1.5 SAFETY (NEEDLE) ×6 IMPLANT
NEEDLE SPNL 18GX3.5 QUINCKE PK (NEEDLE) ×3 IMPLANT
PACK ARTHRO LIMB DRAPE STRL (MISCELLANEOUS) ×3 IMPLANT
PAD ABD 5X9 TENDERSORB (GAUZE/BANDAGES/DRESSINGS) ×3 IMPLANT
PAD ARMBOARD 7.5X6 YLW CONV (MISCELLANEOUS) ×3 IMPLANT
PAD CAST 3X4 CTTN HI CHSV (CAST SUPPLIES) ×2 IMPLANT
PADDING CAST COTTON 3X4 STRL (CAST SUPPLIES) ×3
PADDING CAST COTTON 6X4 STRL (CAST SUPPLIES) ×3 IMPLANT
SET ARTHROSCOPY INST (INSTRUMENTS) ×3 IMPLANT
SET ARTHROSCOPY PUMP TUBE (IRRIGATION / IRRIGATOR) ×3 IMPLANT
SET BASIN LINEN APH (SET/KITS/TRAYS/PACK) ×3 IMPLANT
SOL PREP PROV IODINE SCRUB 4OZ (MISCELLANEOUS) IMPLANT
SPONGE GAUZE 4X4 12PLY (GAUZE/BANDAGES/DRESSINGS) ×3 IMPLANT
SUT ETHILON 3 0 FSL (SUTURE) ×3 IMPLANT
SWAB CULTURE LIQ STUART DBL (MISCELLANEOUS) IMPLANT
SYR 20CC LL (SYRINGE) ×1 IMPLANT
SYR 30ML LL (SYRINGE) ×3 IMPLANT
SYR 50ML LL SCALE MARK (SYRINGE) ×3 IMPLANT
SYR BULB IRRIGATION 50ML (SYRINGE) ×3 IMPLANT
SYR CONTROL 10ML LL (SYRINGE) ×1 IMPLANT
SYRINGE 10CC LL (SYRINGE) ×3 IMPLANT
TUBE ANAEROBIC PORT A CUL  W/M (MISCELLANEOUS) ×3 IMPLANT
WAND 90 DEG TURBOVAC W/CORD (SURGICAL WAND) ×1 IMPLANT
WAND ARTHRO PARAGON T2 (SURGICAL WAND) IMPLANT
YANKAUER SUCT BULB TIP 10FT TU (MISCELLANEOUS) ×9 IMPLANT

## 2013-10-06 NOTE — Transfer of Care (Signed)
Immediate Anesthesia Transfer of Care Note  Patient: Sara [name]  Procedure(s) Performed: Procedure(s) (LRB): KNEE ARTHROSCOPY WITH LATERAL AND MEDIAL MENISECTOMY (Right) REMOVAL FOREIGN BODY EXTREMITY (Right) KNEE INJECTION (Left)  Patient Location: PACU  Anesthesia Type: General  Level of Consciousness: awake  Airway & Oxygen Therapy: Patient Spontanous Breathing and non-rebreather face mask  Post-op Assessment: Report given to PACU RN, Post -op Vital signs reviewed and stable and Patient moving all extremities  Post vital signs: Reviewed and stable  Complications: No apparent anesthesia complications

## 2013-10-06 NOTE — Op Note (Addendum)
10/06/2013  12:39 PM  PATIENT:  Sara [name]  68 y.o. female  PRE-OPERATIVE DIAGNOSIS:  Lateral meniscal tear right knee, Loose body right knee, osteoarthritis  Left knee  pes bursitis  POST-OPERATIVE DIAGNOSIS:  LATERAL MENISCAL TEAR,MEDIAL MENISCAL TEAR,LOOSE BODY , OSTEOARTHRITIS RIGHT KNEE  PES BURSITIS LEFT KNEE   PROCEDURE:  Procedure(s) with comments: KNEE ARTHROSCOPY WITH LATERAL AND MEDIAL MENISECTOMY (Right) - END @ 1234 REMOVAL FOREIGN BODY EXTREMITY (Right) KNEE INJECTION (Left) - Diagnosis:Pes Bursitis  FINDINGS RIGHT KNEE MEDIAL MENISCUS TEAR , MILD OA   LATERAL MENISCUS TEAR SEVERE OA GRADE 4   LOOSE BODY     SURGEON:  Surgeon(s) and Role:    * Carole Civil, MD - Primary  PHYSICIAN ASSISTANT:   ASSISTANTS: none   ANESTHESIA:   none  EBL:  Total I/O In: 1000 [I.V.:1000] Out: 0   BLOOD ADMINISTERED:none  DRAINS: none   LOCAL MEDICATIONS USED:  MARCAINE     SPECIMEN:  No Specimen  DISPOSITION OF SPECIMEN:  N/A  COUNTS:  YES  TOURNIQUET:    DICTATION: .Dragon Dictation  PLAN OF CARE: Discharge to home after PACU  PATIENT DISPOSITION:  PACU - hemodynamically stable.   Delay start of Pharmacological VTE agent (>24hrs) due to surgical blood loss or risk of bleeding: not applicable  The procedure Was done in the following manner.  The right knee was marked as a surgical site  operative arthroscopy. The left knee was marked as  surgical site procedure injection for bursitis  The patient was taken to the operating room placed in supine position and was intubated without complication. The right knee was prepped and draped in sterile technique timeout procedure was completed. A lateral portal was established the scope was placed into the joint and a diagnostic arthroscopy was performed spinal needle was used to prepare a medial portal. A probe was placed in the joint and the structures of the joint were palpated. The findings are as  listed  We addressed the lateral meniscus first. We used a combination of duckbill forceps, motorized shaver and ArthroCare wand 90 to perform a lateral meniscectomy partial. The lateral joint had grade 4 degenerative changes of the tibial plateau grade 3 changes of the femoral condyle which were debrided.  We next turned our attention to the medial meniscus. The meniscal tear is in the posterior horn was a free edge tear was resected with duckbill forceps and motorized shaver was used to remove the meniscal fragments the meniscus was balanced with an ArthroCare wand 90 and confirmed stability with a probe.  In the lateral gutter loose body was noted this was removed through an accessory lateral portal. We used 2 portals to remove this loose body. The loose body measuring approximately 5 mm x 5 mm.  The knee was irrigated and closed with 3-0 nylon sutures. Marcaine was injected into the joint to assist with postoperative anesthesia  Sterile dressings were applied along with a Cryo/Cuff and Ace wrap  Procedure #2 Injection left knee PES bursitis Diagnosis bursitis left knee The attachment of the medial hamstring tendons was prepped with alcohol And then 40 mg of Depo-Medrol with 1% lidocaine 5 cc was injected. No complications Band-Aid applied  The patient was then taken to the recovery room in stable condition

## 2013-10-06 NOTE — Brief Op Note (Addendum)
10/06/2013  12:39 PM  PATIENT:  Sara [name]  68 y.o. female  PRE-OPERATIVE DIAGNOSIS:  Lateral meniscal tear right knee, Loose body right knee, osteoarthritis  Left knee  pes bursitis  POST-OPERATIVE DIAGNOSIS:  LATERAL MENISCAL TEAR,MEDIAL MENISCAL TEAR,LOOSE BODY , OSTEOARTHRITIS RIGHT KNEE  PES BURSITIS LEFT KNEE   PROCEDURE:  Procedure(s) with comments: KNEE ARTHROSCOPY WITH LATERAL AND MEDIAL MENISECTOMY (Right) - END @ 1234 REMOVAL FOREIGN BODY EXTREMITY (Right) KNEE INJECTION (Left) - Diagnosis:Pes Bursitis  FINDINGS RIGHT KNEE MEDIAL MENISCUS TEAR , MILD OA   LATERAL MENISCUS TEAR SEVERE OA GRADE 4   LOOSE BODY     SURGEON:  Surgeon(s) and Role:    * Carole Civil, MD - Primary  PHYSICIAN ASSISTANT:   ASSISTANTS: none   ANESTHESIA:   none  EBL:  Total I/O In: 1000 [I.V.:1000] Out: 0   BLOOD ADMINISTERED:none  DRAINS: none   LOCAL MEDICATIONS USED:  MARCAINE     SPECIMEN:  No Specimen  DISPOSITION OF SPECIMEN:  N/A  COUNTS:  YES  TOURNIQUET:    DICTATION: .Dragon Dictation  PLAN OF CARE: Discharge to home after PACU  PATIENT DISPOSITION:  PACU - hemodynamically stable.   Delay start of Pharmacological VTE agent (>24hrs) due to surgical blood loss or risk of bleeding: not applicable

## 2013-10-06 NOTE — Anesthesia Procedure Notes (Signed)
Procedure Name: Intubation Date/Time: 10/06/2013 11:15 AM Performed by: Vista Deck Pre-anesthesia Checklist: Patient identified, Timeout performed, Emergency Drugs available, Suction available and Patient being monitored Patient Re-evaluated:Patient Re-evaluated prior to inductionOxygen Delivery Method: Circle system utilized Preoxygenation: Pre-oxygenation with 100% oxygen Intubation Type: IV induction, Rapid sequence and Cricoid Pressure applied Laryngoscope Size: Mac and 3 Grade View: Grade II Tube type: Oral Number of attempts: 2 Airway Equipment and Method: Stylet Placement Confirmation: ETT inserted through vocal cords under direct vision,  positive ETCO2 and breath sounds checked- equal and bilateral Secured at: 22 cm Tube secured with: Tape Dental Injury: Teeth and Oropharynx as per pre-operative assessment  Difficulty Due To: Difficulty was anticipated Comments: First attempt with Miller 2. Poor visualization, even with firm cricoid. Second laryngoscopy with MAC 3, grade 2 with firm cricoid

## 2013-10-06 NOTE — Anesthesia Preprocedure Evaluation (Signed)
Anesthesia Evaluation  Patient identified by MRN, date of birth, ID band Patient awake    Reviewed: Allergy & Precautions, H&P , NPO status , Patient's Chart, lab work & pertinent test results, reviewed documented beta blocker date and time   History of Anesthesia Complications Negative for: history of anesthetic complications  Airway Mallampati: II TM Distance: >3 FB Neck ROM: Full    Dental  (+) Teeth Intact and Dental Advisory Given   Pulmonary neg shortness of breath, sleep apnea , former smoker,  breath sounds clear to auscultation  Pulmonary exam normal       Cardiovascular hypertension, Pt. on home beta blockers Rhythm:Regular Rate:Normal     Neuro/Psych  Headaches,  Neuromuscular disease    GI/Hepatic Neg liver ROS, hiatal hernia, PUD, GERD-  Medicated,  Endo/Other  Hypothyroidism Morbid obesity  Renal/GU negative Renal ROS     Musculoskeletal   Abdominal   Peds  Hematology   Anesthesia Other Findings   Reproductive/Obstetrics                           Anesthesia Physical Anesthesia Plan  ASA: III  Anesthesia Plan: General   Post-op Pain Management:    Induction: Intravenous, Rapid sequence and Cricoid pressure planned  Airway Management Planned: Oral ETT  Additional Equipment:   Intra-op Plan:   Post-operative Plan: Extubation in OR  Informed Consent: I have reviewed the patients History and Physical, chart, labs and discussed the procedure including the risks, benefits and alternatives for the proposed anesthesia with the patient or authorized representative who has indicated his/her understanding and acceptance.   Dental advisory given and History available from chart only  Plan Discussed with:   Anesthesia Plan Comments:         Anesthesia Quick Evaluation

## 2013-10-06 NOTE — Interval H&P Note (Signed)
History and Physical Interval Note:  10/06/2013 10:36 AM  Sara [name]  has presented today for surgery, with the diagnosis of Lateral meniscal tear right knee, Loose body right knee, osteoarthritis, pes bursitis  The various methods of treatment have been discussed with the patient and family. After consideration of risks, benefits and other options for treatment, the patient has consented to  Procedure(s) with comments: KNEE ARTHROSCOPY WITH LATERAL MENISECTOMY (Right) REMOVAL FOREIGN BODY EXTREMITY (Right) KNEE INJECTION (Left) - Diagnosis:Pes Bursitis as a surgical intervention .  The patient's history has been reviewed, patient examined, no change in status, stable for surgery.  I have reviewed the patient's chart and labs.  Questions were answered to the patient's satisfaction.     Stanley Harrison  SARK LM  LEFT KNEE PES INJECTION

## 2013-10-06 NOTE — Anesthesia Postprocedure Evaluation (Signed)
Anesthesia Post Note  Patient: Sara [name]  Procedure(s) Performed: Procedure(s) (LRB): KNEE ARTHROSCOPY WITH LATERAL AND MEDIAL MENISECTOMY (Right) REMOVAL FOREIGN BODY EXTREMITY (Right) KNEE INJECTION (Left)  Anesthesia type: General  Patient location: PACU  Post pain: Pain level controlled  Post assessment: Post-op Vital signs reviewed, Patient's Cardiovascular Status Stable, Respiratory Function Stable, Patent Airway, No signs of Nausea or vomiting and Pain level controlled  Last Vitals:  Filed Vitals:   10/06/13 1255  BP: 95/59  Pulse: 62  Temp: 36.5 C  Resp: 14    Post vital signs: Reviewed and stable  Level of consciousness: awake and alert   Complications: No apparent anesthesia complications

## 2013-10-09 ENCOUNTER — Encounter: Payer: Self-pay | Admitting: Orthopedic Surgery

## 2013-10-09 ENCOUNTER — Ambulatory Visit (INDEPENDENT_AMBULATORY_CARE_PROVIDER_SITE_OTHER): Payer: Self-pay | Admitting: Orthopedic Surgery

## 2013-10-09 VITALS — BP 105/55 | Ht 66.0 in | Wt 217.0 lb

## 2013-10-09 DIAGNOSIS — Z9889 Other specified postprocedural states: Secondary | ICD-10-CM

## 2013-10-09 DIAGNOSIS — Z96652 Presence of left artificial knee joint: Secondary | ICD-10-CM | POA: Insufficient documentation

## 2013-10-09 NOTE — Patient Instructions (Signed)
Physical therapy this week  Continue home exercises 25 knee bands 3 times a day  Continue ice 3 times a day with a Cryo/Cuff for 30 minutes  Weight-bear as tolerated

## 2013-10-09 NOTE — Progress Notes (Signed)
Patient ID: Sara [name], female   DOB: 05/12/1946, 68 y.o.   MRN: [id number] PRE-OPERATIVE DIAGNOSIS:  Lateral meniscal tear right knee, Loose body right knee, osteoarthritis  Left knee  pes bursitis  POST-OPERATIVE DIAGNOSIS:  LATERAL MENISCAL TEAR,MEDIAL MENISCAL TEAR,LOOSE BODY , OSTEOARTHRITIS RIGHT KNEE  PES BURSITIS LEFT KNEE   PROCEDURE:  Procedure(s) with comments: KNEE ARTHROSCOPY WITH LATERAL AND MEDIAL MENISECTOMY (Right) - END @ 1234 REMOVAL FOREIGN BODY EXTREMITY (Right) KNEE INJECTION (Left) - Diagnosis:Pes Bursitis  FINDINGS RIGHT KNEE MEDIAL MENISCUS TEAR , MILD OA   LATERAL MENISCUS TEAR SEVERE OA GRADE 4   LOOSE BODY    The knee looks good she has some swelling she's having some pain most of its throbbing  We would like to start therapy and followup in 3-4 weeks

## 2013-10-11 ENCOUNTER — Ambulatory Visit (HOSPITAL_COMMUNITY)
Admission: RE | Admit: 2013-10-11 | Discharge: 2013-10-11 | Disposition: A | Discharge status: Home / Self Care | Payer: PRIVATE HEALTH INSURANCE | Source: Ambulatory Visit | Attending: Orthopedic Surgery | Admitting: Orthopedic Surgery

## 2013-10-11 DIAGNOSIS — M25569 Pain in unspecified knee: Secondary | ICD-10-CM | POA: Insufficient documentation

## 2013-10-11 DIAGNOSIS — M25669 Stiffness of unspecified knee, not elsewhere classified: Secondary | ICD-10-CM | POA: Insufficient documentation

## 2013-10-11 DIAGNOSIS — R29898 Other symptoms and signs involving the musculoskeletal system: Secondary | ICD-10-CM | POA: Insufficient documentation

## 2013-10-11 DIAGNOSIS — I10 Essential (primary) hypertension: Secondary | ICD-10-CM | POA: Insufficient documentation

## 2013-10-11 DIAGNOSIS — IMO0001 Reserved for inherently not codable concepts without codable children: Secondary | ICD-10-CM | POA: Insufficient documentation

## 2013-10-11 DIAGNOSIS — R269 Unspecified abnormalities of gait and mobility: Secondary | ICD-10-CM | POA: Insufficient documentation

## 2013-10-11 NOTE — Evaluation (Signed)
Physical Therapy Evaluation  Patient Details  Name: Sara [name] MRN: [id number] Date of Birth: 1946-05-10  Today's Date: 10/11/2013 Time: 1120-1200 PT Time Calculation (min): 40 min Charges: 1 evaluation            Visit#: 1 of 12  Re-eval: 11/10/13 Assessment Diagnosis: right knee scope 10/06/13 Surgical Date: 10/06/13 Next MD Visit: Dr. Aline Brochure MD (feb 17)  Authorization: medicaid, [facility]  Medicare    Authorization Time Period:    Authorization Visit#: 1 of 10   Past Medical History:  Past Medical History  Diagnosis Date  . Thyroid disease   . Chronic back pain   . Reflux   . Unspecified arthropathy, shoulder region   . Pure hypercholesterolemia     takes Pravastin daily  . Allergic rhinitis due to pollen   . Cervicalgia   . Diverticulosis of colon (without mention of hemorrhage)   . Morbid obesity   . Palpitations   . Peptic ulcer, unspecified site, unspecified as acute or chronic, without mention of hemorrhage, perforation, or obstruction   . Peripheral vertigo, unspecified     takes Meclizine prn  . Reflux esophagitis   . Spinal stenosis in cervical region   . 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 Personal history of tobacco use, presenting hazards to health   . Insomnia, unspecified   . Lumbago   . Right leg pain   . Shortness of breath   . H/O hiatal hernia   . HTN (hypertension)     takes Metoprolol and Lisinopril daily  . Hypertension   . Sleep apnea     slight but doesn't require a CPAP  . History of bronchitis     > 51yr ago  . Seasonal allergies     uses Flonase daily  . Headache(784.0)     denies migraines since the 80's but has occ and takes Butalbital prn  . Cervical spondylosis without myelopathy     all over  . Degeneration of cervical intervertebral disc   . Primary localized osteoarthrosis, lower leg   . GERD (gastroesophageal reflux disease)     takes Omeprazole daily   . Constipation   . Diverticulosis   . History of bladder infections   . Urinary incontinence     occasionally  . Other postablative hypothyroidism     takes SYnthroid daily  . Insomnia     takes Ativan nigtly   Past Surgical History:  Past Surgical History  Procedure Laterality Date  . Knee surgery      arthroscopy-- R  . Tonsillectomy    . Appendectomy    . Colonoscopy  Sept 2008    RMR: normal rectum, left-sided diverticula, repeat in 2018  . Abdominal hysterectomy    . Upper gastrointestinal endoscopy    . Breast lumpectomy      left   . Eye surgery      cataracts removed- bilateral, /w IOL  . Lumpectomy on pelvis    . Diagnostic laparoscopy    . Tubal ligation    . Esophagogastroduodenoscopy    . Anterior lat lumbar fusion  05/13/2012    Procedure: ANTERIOR LATERAL LUMBAR FUSION 2 LEVELS;  Surgeon: Faythe Ghee, MD;  Location: [place] [facility];  Service: Neurosurgery;  Laterality: Left;  Left Lumbar Three-four,Lumbar four-five Extreme Lumbar Interbody Fusion with Percutaneous Pedicle Screws  . Spinal fusion    . Knee arthroscopy with lateral  menisectomy Right 10/06/2013    Procedure: KNEE ARTHROSCOPY WITH LATERAL AND MEDIAL MENISECTOMY;  Surgeon: Carole Civil, MD;  Location: [facility];  Service: Orthopedics;  Laterality: Right;  END @ 1234  . Foreign body removal Right 10/06/2013    Procedure: REMOVAL FOREIGN BODY EXTREMITY;  Surgeon: Carole Civil, MD;  Location: [facility];  Service: Orthopedics;  Laterality: Right;  . Injection knee Left 10/06/2013    Procedure: KNEE INJECTION;  Surgeon: Carole Civil, MD;  Location: [facility];  Service: Orthopedics;  Laterality: Left;    Subjective Symptoms/Limitations Symptoms: s/p right knee scope 10/06/2013, initial knee injury occured during 2008, using rolling walker, cane use before surgery , knee felt unsteady prior to surgery, reports knee replacement may occur 3 months from now. C/o right knee  pain,stiffness,weakness Pertinent History: prior right knee scope 2008, back surgery 2013 , recent left knee injection for pain   Limitations: Walking;House hold activities;Standing Patient Stated Goals: goals are to return  to walking in the neighborhood without cane  Pain Assessment Currently in Pain?: Yes Pain Score: 5  Pain Location: Knee Pain Orientation: Right Pain Type: Surgical pain Pain Relieving Factors: medication controlling pain, using ice compression at home   Precautions/Restrictions     Balance Screening Balance Screen Has the patient fallen in the past 6 months: No Has the patient had a decrease in activity level because of a fear of falling? : Yes (near falls ) Is the patient reluctant to leave their home because of a fear of falling? : No  Prior Functioning  Prior Function: cane use, right knee pain  Level of Independence: Independent with basic ADLs, dressing, bathing independent   Able to Take Stairs?: Yes Driving: Yes Vocation: On disability Leisure: Hobbies-yes (Comment) Comments: walking   Cognition/Observation Cognition Overall Cognitive Status: Within Functional Limits for tasks assessed Observation/Other Assessments Observations: incison portals heaing, blood drainiage WNL , band aids over portals, sutures removed   Sensation/Coordination/Flexibility/Functional Tests Functional Tests Functional Tests: FOTO  24  Assessment RLE AROM (degrees) Right Knee Extension: 2 Right Knee Flexion: 110 RLE Strength Right Hip Flexion: 3/5 Right Knee Flexion: 3/5 Right Knee Extension: 3/5 LLE AROM (degrees) Left Knee Extension: 0 Left Knee Flexion: 120  Exercise/Treatments Mobility/Balance  Ambulation/Gait Ambulation/Gait: Yes Ambulation/Gait Assistance: 7: Independent Assistive device: Rolling walker Gait Pattern: Decreased step length - right;Decreased step length - left;Decreased stance time - right       Supine Quad Sets:  Strengthening;Right;10 reps (5 sec holds ) Straight Leg Raises: Right;Strengthening;2 sets;5 reps Other Supine Knee Exercises: ankkle pumps 20x       Physical Therapy Assessment and Plan PT Assessment and Plan Clinical Impression Statement: 68 yr old s/p right knee scope 10/06/2013. pateint presents with right knee post op pain, swelling, decreased R LE strength with weak quadriceps contraction, and reliance on walker for ambulation. Without skilled rehab pateint at risk for further strength loss and ROM deficits in right knee.  Pt will benefit from skilled therapeutic intervention in order to improve on the following deficits: Abnormal gait;Decreased activity tolerance;Decreased balance;Decreased mobility;Difficulty walking;Decreased strength;Decreased range of motion;Pain Rehab Potential: Good Clinical Impairments Affecting Rehab Potential: hx of prior knee scope, future knee replacement planned in 3 months, cane use since 2008   PT Frequency: Min 3X/week PT Duration: 4 weeks PT Treatment/Interventions: Gait training;Stair training;Functional mobility training;Therapeutic activities;Therapeutic exercise;Manual techniques;Neuromuscular re-education;Modalities;Balance training PT Plan: stationary bike, gait training with cane, knee ther exercise, manual retro massage and knee mobs for ROM, leg stretches  Goals Home Exercise Program Pt/caregiver will Perform Home Exercise Program: Independently PT Goal: Perform Home Exercise Program - Progress: Goal set today PT Short Term Goals Time to Complete Short Term Goals: 2 weeks PT Short Term Goal 1: improve AROM right knee flexion 120 degrres for tranfsers, gait, stairs  PT Short Term Goal 2: Ambulating with cane full weight bearing inside and outside home  , 10 min tolerance  PT Short Term Goal 3: sit from stand minimal use of hands , equal weight B LE  PT Long Term Goals Time to Complete Long Term Goals: 4 weeks PT Long Term Goal 1: MMT  right quadriceps and hamstrings  4/5 for leg stability and transfers  PT Long Term Goal 2: Return to bathing and dressing independently  Long Term Goal 3: ambulate with cane 15  min level terrain for household and community activity full weight bearing  Long Term Goal 4: ascend and desend 1-2 stairs comfortably and safely one handrail   Problem List Patient Active Problem List   Diagnosis Date Noted  . Abnormality of gait 10/11/2013  . S/P arthroscopy of right knee 10/09/2013  . Radicular pain of right lower extremity 07/25/2013  . Knee instability 07/25/2013  . Right knee pain 07/25/2013  . Chest pain 04/25/2013  . Patellar tendonitis 09/07/2012  . OA (osteoarthritis) of knee 02/09/2012  . GERD (gastroesophageal reflux disease) 12/01/2011  . Dysphagia 12/01/2011  . RUQ pain 12/01/2011  . Fatty liver 12/01/2011  . Hematochezia 12/01/2011  . INTERMITTENT VERTIGO 09/12/2010  . KNEE, ARTHRITIS, DEGEN./OSTEO 04/03/2009  . CHEST PAIN, RECURRENT 03/07/2009  . OTHER DYSPHAGIA 03/07/2009  . PALPITATIONS 02/06/2009  . LIVER FUNCTION TESTS, ABNORMAL, HX OF 01/01/2009  . MORBID OBESITY 12/31/2008  . PUD 12/31/2008  . BACK PAIN 12/24/2008  . ANSERINE BURSITIS 12/24/2008  . BENIGN POSITIONAL VERTIGO 07/16/2008  . H N P-LUMBAR 04/02/2008  . SPINAL STENOSIS, CERVICAL 02/15/2008  . SPINAL STENOSIS, LUMBAR 02/15/2008  . Patellar tendinitis 10/19/2007  . TEAR LATERAL MENISCUS 06/13/2007  . OBESITY NOS 10/29/2006  . ALLERGIC RHINITIS, SEASONAL 10/29/2006  . HYPOTHYROIDISM 09/07/2006  . HYPERLIPIDEMIA 09/07/2006  . HYPERTENSION 09/07/2006  . GERD 09/07/2006  . DIVERTICULOSIS, COLON 09/07/2006  . OVERACTIVE BLADDER 09/07/2006  . FIBROCYSTIC BREAST DISEASE 09/07/2006  . OSTEOARTHRITIS 09/07/2006    PT - End of Session Activity Tolerance: Patient tolerated treatment well;Patient limited by pain General Behavior During Therapy: WFL for tasks assessed/performed PT Plan of Care PT Home  Exercise Plan: written  PT Patient Instructions: written HEP  Consulted and Agree with Plan of Care: Patient  GP Functional Assessment Tool Used: FOTO    intial status mobilty CL, Goal CK  Mobility: Walking and Moving Around Current Status ([id number]): At least 60 percent but less than 80 percent impaired, limited or restricted Mobility: Walking and Moving Around Goal Status [phone]): At least 40 percent but less than 60 percent impaired, limited or restricted  Zinia Innocent 10/11/2013, 12:14 PM  Physician Documentation Your signature is required to indicate approval of the treatment plan as stated above.  Please sign and either send electronically or make a copy of this report for your files and return this physician signed original.   Please mark one 1.__approve of plan  2. ___approve of plan with the following conditions.   ______________________________  _____________________ Physician Signature                                                                                                             Date

## 2013-10-12 ENCOUNTER — Telehealth: Payer: Self-pay | Admitting: Orthopedic Surgery

## 2013-10-12 ENCOUNTER — Ambulatory Visit (HOSPITAL_COMMUNITY)
Admission: RE | Admit: 2013-10-12 | Discharge: 2013-10-12 | Disposition: A | Discharge status: Home / Self Care | Payer: PRIVATE HEALTH INSURANCE | Source: Ambulatory Visit | Attending: Family Medicine | Admitting: Family Medicine

## 2013-10-12 ENCOUNTER — Other Ambulatory Visit: Payer: Self-pay | Admitting: *Deleted

## 2013-10-12 DIAGNOSIS — M25561 Pain in right knee: Secondary | ICD-10-CM

## 2013-10-12 DIAGNOSIS — R269 Unspecified abnormalities of gait and mobility: Secondary | ICD-10-CM

## 2013-10-12 MED ORDER — OXYCODONE-ACETAMINOPHEN 5-325 MG PO TABS: 1.0000 | ORAL_TABLET | ORAL | Status: DC | PRN

## 2013-10-12 NOTE — Telephone Encounter (Signed)
Routing to Dr Harrison 

## 2013-10-12 NOTE — Telephone Encounter (Signed)
Sara [name] needs another prescription for Oxycodone.  Said if you do not want her to continue the Oxycodone, just write what you suggest she take for pain

## 2013-10-12 NOTE — Progress Notes (Signed)
Physical Therapy Treatment Patient Details  Name: Sara [name] MRN: [id number] Date of Birth: 1946-02-26  Today's Date: 10/12/2013 Time: 1941-7408 PT Time Calculation (min): 40 min Charge TE [id number], Gait 0955-1003, Manual 1005-1018  Visit#: 2 of 12  Re-eval: 11/10/13 Assessment Diagnosis: right knee scope 10/06/13 Surgical Date: 10/06/13 Next MD Visit: Dr. Aline Brochure MD feb 17  Authorization: medicaid, [facility]   Authorization Time Period:    Authorization Visit#: 2 of 10   Subjective: Symptoms/Limitations Symptoms: Pt stated compliance with HEP without any questions.  Current pain scale 7/10 premedicated Pain Assessment Currently in Pain?: Yes Pain Score: 7  Pain Location: Knee Pain Orientation: Right  Objective:   Exercise/Treatments Aerobic Stationary Bike: 8' no re   Standing Heel Raises: 10 reps;Limitations Heel Raises Limitations: Toe raises 10 reps Knee Flexion: 10 reps Gait Training: SPC x 258 feet SBA with vc-ing for sequencing and heel to toe pattern  Manual Therapy Manual Therapy: Edema management Edema Management: Retro massage with LE elevated and pt actively completeing ankle pumps  Physical Therapy Assessment and Plan PT Assessment and Plan Clinical Impression Statement: Began POC for Rt knee scope focusing on improving ROM, gait training with SPC and manual techniques to reduce edema.  Pt able to make full revoluation on bicycle backwards originally then able to go forward.  Cueing for sequencing and heel to toe pattern with SPC gait training, SBA with no LOB episdoes.  Ended session with retro massage with LE elevated, pt activately completeing ankle pumpts.  Pt educated on benefits of ice with elevation for pain and edema control.  Pt plans to apply ice at home.   PT Plan: Continue with current POC for ROM, gait training with SPC, strenghtening and manual techniques for edema control and patella mobs.  Add stretches next session.      Goals Home  Exercise Program Pt/caregiver will Perform Home Exercise Program: Independently PT Short Term Goals Time to Complete Short Term Goals: 2 weeks PT Short Term Goal 1: improve AROM right knee flexion 120 degrres for tranfsers, gait, stairs  PT Short Term Goal 1 - Progress: Progressing toward goal PT Short Term Goal 2: Ambulating with cane full weight bearing inside and outside home  , 10 min tolerance  PT Short Term Goal 2 - Progress: Progressing toward goal PT Short Term Goal 3: sit from stand minimal use of hands , equal weight B LE  PT Long Term Goals Time to Complete Long Term Goals: 4 weeks PT Long Term Goal 1: MMT right quadriceps and hamstrings  4/5 for leg stability and transfers  PT Long Term Goal 2: Return to bathing and dressing independently  Long Term Goal 3: ambulate with cane 15  min level terrain for household and community activity full weight bearing  Long Term Goal 4: ascend and desend 1-2 stairs comfortably and safely one handrail   Problem List Patient Active Problem List   Diagnosis Date Noted  . Abnormality of gait 10/11/2013  . S/P arthroscopy of right knee 10/09/2013  . Radicular pain of right lower extremity 07/25/2013  . Knee instability 07/25/2013  . Right knee pain 07/25/2013  . Chest pain 04/25/2013  . Patellar tendonitis 09/07/2012  . OA (osteoarthritis) of knee 02/09/2012  . GERD (gastroesophageal reflux disease) 12/01/2011  . Dysphagia 12/01/2011  . RUQ pain 12/01/2011  . Fatty liver 12/01/2011  . Hematochezia 12/01/2011  . INTERMITTENT VERTIGO 09/12/2010  . KNEE, ARTHRITIS, DEGEN./OSTEO 04/03/2009  . CHEST PAIN, RECURRENT 03/07/2009  . OTHER  DYSPHAGIA 03/07/2009  . PALPITATIONS 02/06/2009  . LIVER FUNCTION TESTS, ABNORMAL, HX OF 01/01/2009  . MORBID OBESITY 12/31/2008  . PUD 12/31/2008  . BACK PAIN 12/24/2008  . ANSERINE BURSITIS 12/24/2008  . BENIGN POSITIONAL VERTIGO 07/16/2008  . H N P-LUMBAR 04/02/2008  . SPINAL STENOSIS, CERVICAL  02/15/2008  . SPINAL STENOSIS, LUMBAR 02/15/2008  . Patellar tendinitis 10/19/2007  . TEAR LATERAL MENISCUS 06/13/2007  . OBESITY NOS 10/29/2006  . ALLERGIC RHINITIS, SEASONAL 10/29/2006  . HYPOTHYROIDISM 09/07/2006  . HYPERLIPIDEMIA 09/07/2006  . HYPERTENSION 09/07/2006  . GERD 09/07/2006  . DIVERTICULOSIS, COLON 09/07/2006  . OVERACTIVE BLADDER 09/07/2006  . FIBROCYSTIC BREAST DISEASE 09/07/2006  . OSTEOARTHRITIS 09/07/2006    PT - End of Session Activity Tolerance: Patient tolerated treatment well General Behavior During Therapy: [facility] for tasks assessed/performed  GP    Aldona Lento 10/12/2013, 12:09 PM

## 2013-10-12 NOTE — Telephone Encounter (Signed)
Better get this before the week end   Percocet # [phone]

## 2013-10-12 NOTE — Telephone Encounter (Signed)
Patient advised prescription was ready to be picked up.

## 2013-10-13 NOTE — Telephone Encounter (Signed)
Prescription picked up by the patient

## 2013-10-16 ENCOUNTER — Ambulatory Visit (HOSPITAL_COMMUNITY)
Admission: RE | Admit: 2013-10-16 | Discharge: 2013-10-16 | Disposition: A | Discharge status: Home / Self Care | Payer: PRIVATE HEALTH INSURANCE | Source: Ambulatory Visit | Attending: Family Medicine | Admitting: Family Medicine

## 2013-10-16 DIAGNOSIS — M25561 Pain in right knee: Secondary | ICD-10-CM

## 2013-10-16 DIAGNOSIS — R269 Unspecified abnormalities of gait and mobility: Secondary | ICD-10-CM

## 2013-10-16 NOTE — Progress Notes (Signed)
Physical Therapy Treatment Patient Details  Name: Sara [name] MRN: [id number] Date of Birth: 22-Apr-1946  Today's Date: 10/16/2013 Time: 1100-1150 PT Time Calculation (min): 50 min Charges  07-1149  TE  Visit#: 3 of 12  Re-eval: 11/10/13 Assessment Diagnosis: right knee scope 10/06/13 Surgical Date: 10/06/13 Next MD Visit: Dr. Aline Brochure MD feb 17  Authorization: medicaid, [place] Time Period:    Authorization Visit#: 3 of 10   Subjective: Symptoms/Limitations Symptoms: knee improving  Pertinent History: prior right knee scope 2008, back surgery 2013 , recent left knee injection for pain   Patient Stated Goals: goals are to return  to walking in the neighborhood without cane  Pain Assessment Currently in Pain?: Yes Pain Score: 4  Pain Location: Knee Pain Orientation: Right Pain Type: Surgical pain  Precautions/Restrictions     Exercise/Treatments Mobility/Balance        Stretches Passive Hamstring Stretch: 2 reps;30 seconds Gastroc Stretch: 2 reps;30 seconds Aerobic Stationary Bike: 8' no re   Elliptical: / Machines for Strengthening   Plyometrics   Standing Heel Raises: 20 reps Knee Flexion: 2 sets;10 reps;Both Hip ADduction: Both;2 sets;10 reps Forward Step Up: 2 sets;10 reps;Hand Hold: 1;Step Height: 4" Other Standing Knee Exercises: 3 cones taps 10 reps B 1 hand support  Seated   Supine Quad Sets: 10 reps Short Arc Quad Sets: 2 sets;10 reps;Right Straight Leg Raises: Right;10 reps Sidelying Hip ABduction: Strengthening;Right;10 reps Prone         Physical Therapy Assessment and Plan PT Assessment and Plan Clinical Impression Statement: AROM Right knee flexion improved to 115, needs further quadriceps and balance training  PT Frequency: Min 3X/week PT Duration: 4 weeks PT Plan: sit to stand/squats from chair, continue step ups , balance and gait training, retro masage, patellar mobs, HEP progression     Goals Home Exercise  Program Pt/caregiver will Perform Home Exercise Program: Independently PT Goal: Perform Home Exercise Program - Progress: Progressing toward goal PT Short Term Goals PT Short Term Goal 1: improve AROM right knee flexion 120 degrres for tranfsers, gait, stairs  PT Short Term Goal 1 - Progress: Progressing toward goal PT Short Term Goal 2: Ambulating with cane full weight bearing inside and outside home  , 10 min tolerance  PT Short Term Goal 2 - Progress: Progressing toward goal PT Short Term Goal 3: sit from stand minimal use of hands , equal weight B LE  PT Short Term Goal 3 - Progress: Progressing toward goal PT Long Term Goals Time to Complete Long Term Goals: 4 weeks PT Long Term Goal 1: MMT right quadriceps and hamstrings  4/5 for leg stability and transfers  PT Long Term Goal 1 - Progress: Progressing toward goal PT Long Term Goal 2: Return to bathing and dressing independently  PT Long Term Goal 2 - Progress: Progressing toward goal Long Term Goal 3: ambulate with cane 15  min level terrain for household and community activity full weight bearing  Long Term Goal 3 Progress: Progressing toward goal Long Term Goal 4: ascend and desend 1-2 stairs comfortably and safely one handrail  Long Term Goal 4 Progress: Progressing toward goal  Problem List Patient Active Problem List   Diagnosis Date Noted  . Abnormality of gait 10/11/2013  . S/P arthroscopy of right knee 10/09/2013  . Radicular pain of right lower extremity 07/25/2013  . Knee instability 07/25/2013  . Right knee pain 07/25/2013  . Chest pain 04/25/2013  . Patellar tendonitis 09/07/2012  .  OA (osteoarthritis) of knee 02/09/2012  . GERD (gastroesophageal reflux disease) 12/01/2011  . Dysphagia 12/01/2011  . RUQ pain 12/01/2011  . Fatty liver 12/01/2011  . Hematochezia 12/01/2011  . INTERMITTENT VERTIGO 09/12/2010  . KNEE, ARTHRITIS, DEGEN./OSTEO 04/03/2009  . CHEST PAIN, RECURRENT 03/07/2009  . OTHER DYSPHAGIA  03/07/2009  . PALPITATIONS 02/06/2009  . LIVER FUNCTION TESTS, ABNORMAL, HX OF 01/01/2009  . MORBID OBESITY 12/31/2008  . PUD 12/31/2008  . BACK PAIN 12/24/2008  . ANSERINE BURSITIS 12/24/2008  . BENIGN POSITIONAL VERTIGO 07/16/2008  . H N P-LUMBAR 04/02/2008  . SPINAL STENOSIS, CERVICAL 02/15/2008  . SPINAL STENOSIS, LUMBAR 02/15/2008  . Patellar tendinitis 10/19/2007  . TEAR LATERAL MENISCUS 06/13/2007  . OBESITY NOS 10/29/2006  . ALLERGIC RHINITIS, SEASONAL 10/29/2006  . HYPOTHYROIDISM 09/07/2006  . HYPERLIPIDEMIA 09/07/2006  . HYPERTENSION 09/07/2006  . GERD 09/07/2006  . DIVERTICULOSIS, COLON 09/07/2006  . OVERACTIVE BLADDER 09/07/2006  . FIBROCYSTIC BREAST DISEASE 09/07/2006  . OSTEOARTHRITIS 09/07/2006    PT - End of Session Activity Tolerance: Patient tolerated treatment well PT Plan of Care PT Home Exercise Plan: written HEP   GP    Sara [name] 10/16/2013, 12:04 PM

## 2013-10-17 ENCOUNTER — Encounter: Payer: Self-pay | Admitting: Orthopedic Surgery

## 2013-10-17 ENCOUNTER — Ambulatory Visit (INDEPENDENT_AMBULATORY_CARE_PROVIDER_SITE_OTHER): Payer: Self-pay | Admitting: Orthopedic Surgery

## 2013-10-17 VITALS — BP 125/63 | Ht 66.0 in | Wt 217.0 lb

## 2013-10-17 DIAGNOSIS — Z9889 Other specified postprocedural states: Secondary | ICD-10-CM

## 2013-10-17 NOTE — Progress Notes (Signed)
Patient ID: Sara [name], female   DOB: 02/28/1946, 68 y.o.   MRN: [id number] Status post knee arthroscopy right knee approximately January 16. She became concerned because the knee was warm. She is not complaining of any pain in her knee feels the best it's been since her operation  That knee does not feel warm to touch there is no drainage of the incisions her knee flexion is increased to 90  Nothing out of the ordinary  Keep previous appointments

## 2013-10-18 ENCOUNTER — Inpatient Hospital Stay (HOSPITAL_COMMUNITY)
Admission: RE | Admit: 2013-10-18 | Discharge: 2013-10-18 | Disposition: A | Discharge status: Home / Self Care | Payer: PRIVATE HEALTH INSURANCE | Source: Ambulatory Visit | Attending: Family Medicine | Admitting: Family Medicine

## 2013-10-18 DIAGNOSIS — I10 Essential (primary) hypertension: Secondary | ICD-10-CM | POA: Diagnosis present

## 2013-10-18 DIAGNOSIS — M6281 Muscle weakness (generalized): Secondary | ICD-10-CM

## 2013-10-18 DIAGNOSIS — IMO0001 Reserved for inherently not codable concepts without codable children: Secondary | ICD-10-CM

## 2013-10-18 DIAGNOSIS — M25669 Stiffness of unspecified knee, not elsewhere classified: Secondary | ICD-10-CM | POA: Diagnosis present

## 2013-10-18 DIAGNOSIS — R269 Unspecified abnormalities of gait and mobility: Secondary | ICD-10-CM | POA: Diagnosis present

## 2013-10-18 DIAGNOSIS — M25569 Pain in unspecified knee: Secondary | ICD-10-CM

## 2013-10-18 DIAGNOSIS — M25561 Pain in right knee: Secondary | ICD-10-CM

## 2013-10-18 NOTE — Progress Notes (Signed)
Physical Therapy Treatment Patient Details  Name: Sara [name] MRN: [id number] Date of Birth: 07/18/1946  Today's Date: 10/18/2013 Time: 1100-1150 PT Time Calculation (min): 50 min Charges 07-1114 manual  1120-1150 TE  Visit#: 4 of 12  Re-eval: 11/10/13 Assessment Diagnosis: right knee scope 10/06/13 Surgical Date: 10/06/13 Next MD Visit: Dr. Aline Brochure MD feb 17  Authorization: medicaid, [place] Time Period:    Authorization Visit#: 4 of 10   Subjective: Symptoms/Limitations Symptoms: rijgt knee sore from increeased activity yesterday, sore but not pain  Pertinent History: prior right knee scope 2008, back surgery 2013 , recent left knee injection for pain       Exercise/Treatment todays treatment       Stretches Passive Hamstring Stretch: 2 reps;30 seconds  R  Quad Stretch: 2 reps;30 seconds R  Gastroc Stretch: 2 reps;30 seconds  B  Aerobic Stationary Bike: 8' no re   Elliptical: /     Standing Heel Raises: 20 reps Knee Flexion: 2 sets;15  reps;Both;15 reps Hip ADduction: Both;2 sets;15 reps Forward Step Up: 10 reps;Hand Hold: 1;Step Height: 4";1 set;Right;20 reps Functional Squat:  (5 x sit to stand ) Seated   Supine Quad Sets: 10 reps Short Arc Quad Sets: 2 sets;10 reps;Right Straight Leg Raises: Right;10 reps Sidelying Hip ABduction: Strengthening;Right;10 reps, 2 sets  Clams   R 2 x 10     Manual Therapy Edema Management: retro massage, gentle patellar mobs, gentle AP and PA tibia glides , trial of kinesio tape lymphatic strips 2 for swelling, educated to remove tape if irritations occurs, educated to take tape off tomorrow  morning   Physical Therapy Assessment and Plan PT Assessment and Plan Clinical Impression Statement: improved knee flexion, post op swelling present but reducing, quad strength improving but functionally weak overall, sit to stand no UE this vist from treatment table no UE use  PT Frequency: Min  3X/week PT Duration: 4 weeks PT Treatment/Interventions: Gait training;Stair training;Functional mobility training;Therapeutic activities;Therapeutic exercise;Manual techniques;Neuromuscular re-education;Modalities;Balance training PT Plan: sit to stand/squats from chair, continue step ups , balance and gait training, retro masage, patellar mobs, HEP progression , gait without device     Goals Home Exercise Program Pt/caregiver will Perform Home Exercise Program: Independently;For increased ROM;For increased strengthening PT Goal: Perform Home Exercise Program - Progress: Progressing toward goal PT Short Term Goals PT Short Term Goal 1: improve AROM right knee flexion 120 degrres for tranfsers, gait, stairs  PT Short Term Goal 1 - Progress: Progressing toward goal PT Short Term Goal 2: Ambulating with cane full weight bearing inside and outside home  , 10 min tolerance  PT Short Term Goal 2 - Progress: Met PT Short Term Goal 3: sit from stand minimal use of hands , equal weight B LE  PT Short Term Goal 3 - Progress: Met PT Long Term Goals Time to Complete Long Term Goals: 4 weeks PT Long Term Goal 1: MMT right quadriceps and hamstrings  4/5 for leg stability and transfers  PT Long Term Goal 2: Return to bathing and dressing independently  PT Long Term Goal 2 - Progress: Partly met Long Term Goal 3: ambulate with cane 15  min level terrain for household and community activity full weight bearing  Long Term Goal 3 Progress: Progressing toward goal Long Term Goal 4: ascend and desend 1-2 stairs comfortably and safely one handrail  Long Term Goal 4 Progress: Not met  Problem List Patient Active Problem List   Diagnosis Date  Noted  . Abnormality of gait 10/11/2013  . S/P arthroscopy of right knee 10/09/2013  . Radicular pain of right lower extremity 07/25/2013  . Knee instability 07/25/2013  . Right knee pain 07/25/2013  . Chest pain 04/25/2013  . Patellar tendonitis 09/07/2012  . OA  (osteoarthritis) of knee 02/09/2012  . GERD (gastroesophageal reflux disease) 12/01/2011  . Dysphagia 12/01/2011  . RUQ pain 12/01/2011  . Fatty liver 12/01/2011  . Hematochezia 12/01/2011  . INTERMITTENT VERTIGO 09/12/2010  . KNEE, ARTHRITIS, DEGEN./OSTEO 04/03/2009  . CHEST PAIN, RECURRENT 03/07/2009  . OTHER DYSPHAGIA 03/07/2009  . PALPITATIONS 02/06/2009  . LIVER FUNCTION TESTS, ABNORMAL, HX OF 01/01/2009  . MORBID OBESITY 12/31/2008  . PUD 12/31/2008  . BACK PAIN 12/24/2008  . ANSERINE BURSITIS 12/24/2008  . BENIGN POSITIONAL VERTIGO 07/16/2008  . H N P-LUMBAR 04/02/2008  . SPINAL STENOSIS, CERVICAL 02/15/2008  . SPINAL STENOSIS, LUMBAR 02/15/2008  . Patellar tendinitis 10/19/2007  . TEAR LATERAL MENISCUS 06/13/2007  . OBESITY NOS 10/29/2006  . ALLERGIC RHINITIS, SEASONAL 10/29/2006  . HYPOTHYROIDISM 09/07/2006  . HYPERLIPIDEMIA 09/07/2006  . HYPERTENSION 09/07/2006  . GERD 09/07/2006  . DIVERTICULOSIS, COLON 09/07/2006  . OVERACTIVE BLADDER 09/07/2006  . FIBROCYSTIC BREAST DISEASE 09/07/2006  . OSTEOARTHRITIS 09/07/2006    PT - End of Session Activity Tolerance: Patient tolerated treatment well PT Plan of Care PT Home Exercise Plan: written HEP  Consulted and Agree with Plan of Care: Patient  GP    [name] 10/18/2013, 11:59 AM

## 2013-10-20 ENCOUNTER — Ambulatory Visit (HOSPITAL_COMMUNITY)
Admission: RE | Admit: 2013-10-20 | Discharge: 2013-10-20 | Disposition: A | Discharge status: Home / Self Care | Payer: PRIVATE HEALTH INSURANCE | Source: Ambulatory Visit | Attending: Family Medicine | Admitting: Family Medicine

## 2013-10-20 DIAGNOSIS — R269 Unspecified abnormalities of gait and mobility: Secondary | ICD-10-CM

## 2013-10-20 DIAGNOSIS — M25561 Pain in right knee: Secondary | ICD-10-CM

## 2013-10-20 NOTE — Progress Notes (Signed)
Physical Therapy Treatment Patient Details  Name: Sara [name] MRN: [id number] Date of Birth: Sep 09, 1946  Today's Date: 10/20/2013 Time: 1100-1200 PT Time Calculation (min): 60 min 07-1149 TE   1150-1200 ice  Visit#: 5 of 12  Re-eval: 11/10/13 Assessment Diagnosis: right knee scope 10/06/13 Surgical Date: 10/06/13 Next MD Visit: Dr. Aline Brochure MD feb 17  Authorization: medicaid, [place] Time Period:    Authorization Visit#: 5 of 10   Subjective: Symptoms/Limitations Symptoms: stiffness  Pertinent History: prior right knee scope 2008, back surgery 2013 , recent left knee injection for pain    Precautions/Restrictions     Exercise/Treatments Mobility/Balance        Stretches Passive Hamstring Stretch: 2 reps;30 seconds Quad Stretch: 2 reps;30 seconds Gastroc Stretch: 2 reps;30 seconds Aerobic Stationary Bike: 8' no Science writer for Strengthening   Plyometrics   Standing Heel Raises: 20 reps Knee Flexion: 2 sets;10 reps;Both;15 reps Hip ADduction: Both;2 sets;15 reps Lateral Step Up: Hand Hold: 1;15 reps;Right Forward Step Up: 10 reps;Hand Hold: 1;Step Height: 4";1 set;Right;20 reps Other Standing Knee Exercises: 3 cones taps 10 reps B 1 hand support , 3 way standing balacne vectors 6 x each leg each direction  Other Standing Knee Exercises: standing hip flexion B 2 x 10  Seated Long Arc Quad: Right;5 reps Heel Slides: AROM;10 reps Supine Quad Sets: 15 reps Short Arc Quad Sets: Right;2 sets;10 reps Straight Leg Raises: Right;10 reps Other Supine Knee Exercises: PROM flexion 15 x   Sidelying Hip ABduction: Strengthening;Right;10 reps Clams: 10 x  Prone      Modalities Modalities: Cryotherapy Cryotherapy Number Minutes Cryotherapy: 10 Minutes Cryotherapy Location: Knee  Physical Therapy Assessment and Plan PT Assessment and Plan Clinical Impression Statement: continued to improve functional strength and transitioned into  lateral step ups, ready for 6 in step ups with hand rail next visit, progressing with standing balance  Rehab Potential: Good Clinical Impairments Affecting Rehab Potential: right knee DJD  PT Frequency: Min 3X/week PT Duration: 4 weeks PT Treatment/Interventions: Gait training;Stair training;Functional mobility training;Therapeutic activities;Therapeutic exercise;Manual techniques;Neuromuscular re-education;Modalities;Balance training PT Plan:  continue step ups , balance , retro masage, , HEP progression , gait without device     Goals Home Exercise Program Pt/caregiver will Perform Home Exercise Program: Independently;For increased ROM;For increased strengthening PT Goal: Perform Home Exercise Program - Progress: Progressing toward goal PT Short Term Goals PT Short Term Goal 1: improve AROM right knee flexion 120 degrres for tranfsers, gait, stairs  PT Short Term Goal 1 - Progress: Progressing toward goal PT Short Term Goal 2: Ambulating with cane full weight bearing inside and outside home  , 10 min tolerance  PT Short Term Goal 2 - Progress: Met PT Short Term Goal 3: sit from stand minimal use of hands , equal weight B LE  PT Short Term Goal 3 - Progress: Met PT Long Term Goals Time to Complete Long Term Goals: 4 weeks PT Long Term Goal 1: MMT right quadriceps and hamstrings  4/5 for leg stability and transfers  PT Long Term Goal 1 - Progress: Progressing toward goal PT Long Term Goal 2: Return to bathing and dressing independently  PT Long Term Goal 2 - Progress: Partly met Long Term Goal 3: ambulate with cane 15  min level terrain for household and community activity full weight bearing  Long Term Goal 3 Progress: Progressing toward goal Long Term Goal 4: ascend and desend 1-2 stairs comfortably and safely one handrail  Long  Term Goal 4 Progress: Progressing toward goal  Problem List Patient Active Problem List   Diagnosis Date Noted  . Abnormality of gait 10/11/2013  . S/P  arthroscopy of right knee 10/09/2013  . Radicular pain of right lower extremity 07/25/2013  . Knee instability 07/25/2013  . Right knee pain 07/25/2013  . Chest pain 04/25/2013  . Patellar tendonitis 09/07/2012  . OA (osteoarthritis) of knee 02/09/2012  . GERD (gastroesophageal reflux disease) 12/01/2011  . Dysphagia 12/01/2011  . RUQ pain 12/01/2011  . Fatty liver 12/01/2011  . Hematochezia 12/01/2011  . INTERMITTENT VERTIGO 09/12/2010  . KNEE, ARTHRITIS, DEGEN./OSTEO 04/03/2009  . CHEST PAIN, RECURRENT 03/07/2009  . OTHER DYSPHAGIA 03/07/2009  . PALPITATIONS 02/06/2009  . LIVER FUNCTION TESTS, ABNORMAL, HX OF 01/01/2009  . MORBID OBESITY 12/31/2008  . PUD 12/31/2008  . BACK PAIN 12/24/2008  . ANSERINE BURSITIS 12/24/2008  . BENIGN POSITIONAL VERTIGO 07/16/2008  . H N P-LUMBAR 04/02/2008  . SPINAL STENOSIS, CERVICAL 02/15/2008  . SPINAL STENOSIS, LUMBAR 02/15/2008  . Patellar tendinitis 10/19/2007  . TEAR LATERAL MENISCUS 06/13/2007  . OBESITY NOS 10/29/2006  . ALLERGIC RHINITIS, SEASONAL 10/29/2006  . HYPOTHYROIDISM 09/07/2006  . HYPERLIPIDEMIA 09/07/2006  . HYPERTENSION 09/07/2006  . GERD 09/07/2006  . DIVERTICULOSIS, COLON 09/07/2006  . OVERACTIVE BLADDER 09/07/2006  . FIBROCYSTIC BREAST DISEASE 09/07/2006  . OSTEOARTHRITIS 09/07/2006    PT - End of Session Activity Tolerance: Patient tolerated treatment well PT Plan of Care PT Home Exercise Plan: written HEP  Consulted and Agree with Plan of Care: Patient  GP    Auriel Kist 10/20/2013, 12:12 PM

## 2013-10-23 ENCOUNTER — Ambulatory Visit (HOSPITAL_COMMUNITY)
Admission: RE | Admit: 2013-10-23 | Discharge: 2013-10-23 | Disposition: A | Discharge status: Home / Self Care | Payer: PRIVATE HEALTH INSURANCE | Source: Ambulatory Visit | Attending: Family Medicine | Admitting: Family Medicine

## 2013-10-23 DIAGNOSIS — I10 Essential (primary) hypertension: Secondary | ICD-10-CM | POA: Insufficient documentation

## 2013-10-23 DIAGNOSIS — IMO0001 Reserved for inherently not codable concepts without codable children: Secondary | ICD-10-CM | POA: Insufficient documentation

## 2013-10-23 DIAGNOSIS — M25669 Stiffness of unspecified knee, not elsewhere classified: Secondary | ICD-10-CM | POA: Insufficient documentation

## 2013-10-23 DIAGNOSIS — M25569 Pain in unspecified knee: Secondary | ICD-10-CM | POA: Insufficient documentation

## 2013-10-23 DIAGNOSIS — R269 Unspecified abnormalities of gait and mobility: Secondary | ICD-10-CM | POA: Insufficient documentation

## 2013-10-23 DIAGNOSIS — R29898 Other symptoms and signs involving the musculoskeletal system: Secondary | ICD-10-CM | POA: Insufficient documentation

## 2013-10-23 NOTE — Progress Notes (Signed)
Physical Therapy Treatment Patient Details  Name: Sara [name] MRN: [id number] Date of Birth: 1946-05-13  Today's Date: 10/23/2013 Time: 1102-1145 PT Time Calculation (min): 43 min Visit#: 6 of 12  Re-eval: 11/10/13 Authorization: medicaid, [employer] medicare   Authorization Visit#: 6 of 10  Charges:  therex 40  Subjective: Symptoms/Limitations Symptoms: PT states she is doing well overall.  Continues to have stiffness and is using her cane very little. Pain Assessment Currently in Pain?: No/denies  Exercise/Treatments Stretches Gastroc Stretch: 2 reps;30 seconds Aerobic Stationary Bike: 8' at 6.0 seat on 9   Standing Heel Raises: Limitations Heel Raises Limitations: heel walk, toe walk 1RT Knee Flexion: 15 reps;Limitations Knee Flexion Limitations: 3# SLS with Vectors: 5X5" Rt LE Other Standing Knee Exercises: tandem gait, retro gait, balance beam 2RT each    Physical Therapy Assessment and Plan PT Assessment and Plan Clinical Impression Statement: Pt reported having to leave session early today due to appt.  States balance is most difficult for her.  Progressed balance activities today to include balance beam.  Pt able to complete with several LOB.  Encouraged pt to continue to wean from [facility].  Progresssing well. PT Plan: Progress balance activities and attempt stairwell next visit .    Problem List Patient Active Problem List   Diagnosis Date Noted  . Abnormality of gait 10/11/2013  . S/P arthroscopy of right knee 10/09/2013  . Radicular pain of right lower extremity 07/25/2013  . Knee instability 07/25/2013  . Right knee pain 07/25/2013  . Chest pain 04/25/2013  . Patellar tendonitis 09/07/2012  . OA (osteoarthritis) of knee 02/09/2012  . GERD (gastroesophageal reflux disease) 12/01/2011  . Dysphagia 12/01/2011  . RUQ pain 12/01/2011  . Fatty liver 12/01/2011  . Hematochezia 12/01/2011  . INTERMITTENT VERTIGO 09/12/2010  . KNEE, ARTHRITIS, DEGEN./OSTEO  04/03/2009  . CHEST PAIN, RECURRENT 03/07/2009  . OTHER DYSPHAGIA 03/07/2009  . PALPITATIONS 02/06/2009  . LIVER FUNCTION TESTS, ABNORMAL, HX OF 01/01/2009  . MORBID OBESITY 12/31/2008  . PUD 12/31/2008  . BACK PAIN 12/24/2008  . ANSERINE BURSITIS 12/24/2008  . BENIGN POSITIONAL VERTIGO 07/16/2008  . H N P-LUMBAR 04/02/2008  . SPINAL STENOSIS, CERVICAL 02/15/2008  . SPINAL STENOSIS, LUMBAR 02/15/2008  . Patellar tendinitis 10/19/2007  . TEAR LATERAL MENISCUS 06/13/2007  . OBESITY NOS 10/29/2006  . ALLERGIC RHINITIS, SEASONAL 10/29/2006  . HYPOTHYROIDISM 09/07/2006  . HYPERLIPIDEMIA 09/07/2006  . HYPERTENSION 09/07/2006  . GERD 09/07/2006  . DIVERTICULOSIS, COLON 09/07/2006  . OVERACTIVE BLADDER 09/07/2006  . FIBROCYSTIC BREAST DISEASE 09/07/2006  . OSTEOARTHRITIS 09/07/2006    PT - End of Session Activity Tolerance: Patient tolerated treatment well PT Plan of Care Consulted and Agree with Plan of Care: Patient   Teena Irani, PTA/CLT 10/23/2013, 5:03 PM

## 2013-10-25 ENCOUNTER — Ambulatory Visit (HOSPITAL_COMMUNITY)
Admission: RE | Admit: 2013-10-25 | Discharge: 2013-10-25 | Disposition: A | Discharge status: Home / Self Care | Payer: PRIVATE HEALTH INSURANCE | Source: Ambulatory Visit

## 2013-10-25 DIAGNOSIS — R269 Unspecified abnormalities of gait and mobility: Secondary | ICD-10-CM

## 2013-10-25 DIAGNOSIS — M25561 Pain in right knee: Secondary | ICD-10-CM

## 2013-10-25 NOTE — Progress Notes (Signed)
Physical Therapy Treatment Patient Details  Name: Sara [name] MRN: [id number] Date of Birth: [birth year]  Today's Date: 10/25/2013 Time: 1105-1203 PT Time Calculation (min): 58 min Charge: TE [id number], NMR [id number], Ice 2025-4270  Visit#: 7 of 12  Re-eval: 11/10/13 Assessment Diagnosis: right knee scope 10/06/13 Surgical Date: 10/06/13 Next MD Visit: Dr. Aline Brochure MD feb 17  Authorization: medicaid, [facility] medicare   Authorization Time Period:    Authorization Visit#: 7 of 10   Subjective: Symptoms/Limitations Symptoms: Pain free today, c/o stiffness and tenderness peroneal region. Pain Assessment Currently in Pain?: No/denies  Objective:   Exercise/Treatments Stretches Gastroc Stretch: 2 reps;30 seconds Aerobic Stationary Bike: Nustep hill #3, resistance 4 x 8 minutes LE only for strengthening Machines for Strengthening Cybex Knee Extension: 1Pl 10x  Cybex Knee Flexion: 2.5 Pl 10x Cybex Leg Press: leg press 20x; plantar flex with controlled dorsi flexion 10x Standing Heel Raises: Limitations Heel Raises Limitations: heel walk, toe walk 2RT Stairs: 1RT reciprocal pattern with 1 HR SLS with Vectors: 5X5" Rt LE Other Standing Knee Exercises: tandem gait, retro gait on balance beam 2RT each Other Standing Knee Exercises: 6 and 12 in hurdles alternating 2RT  Modalities Modalities: Cryotherapy Cryotherapy Number Minutes Cryotherapy: 10 Minutes Cryotherapy Location: Knee Type of Cryotherapy: Ice pack  Physical Therapy Assessment and Plan PT Assessment and Plan Clinical Impression Statement: Progressed strengthening and balance this session.  Began stairwell training reciprocal pattern with noted weak eccentric control, pt proud of accomplishment due to fear of heights.  Able to demonstrate safe mechanics with reciprocal pattern with no LOB episodes.  Improved balance on balance beam with minimal LOB episodes, improved with cueing for spatial awareness.  Progressed  balance activities to alternating hurdles for hip strengthening and balance.  Began cybex weight machnines for LE strengthening.  Ended sessoin with elevated ice for pain and edema control.   PT Plan: Continue with current POC for balance, stairwell training and gait.    Goals Home Exercise Program Pt/caregiver will Perform Home Exercise Program: Independently;For increased ROM;For increased strengthening PT Short Term Goals PT Short Term Goal 1: improve AROM right knee flexion 120 degrres for tranfsers, gait, stairs  PT Short Term Goal 1 - Progress: Progressing toward goal PT Short Term Goal 2: Ambulating with cane full weight bearing inside and outside home  , 10 min tolerance  PT Short Term Goal 3: sit from stand minimal use of hands , equal weight B LE  PT Long Term Goals Time to Complete Long Term Goals: 4 weeks PT Long Term Goal 1: MMT right quadriceps and hamstrings  4/5 for leg stability and transfers  PT Long Term Goal 1 - Progress: Progressing toward goal PT Long Term Goal 2: Return to bathing and dressing independently  Long Term Goal 3: ambulate with cane 15  min level terrain for household and community activity full weight bearing  Long Term Goal 4: ascend and desend 1-2 stairs comfortably and safely one handrail  Long Term Goal 4 Progress: Progressing toward goal  Problem List Patient Active Problem List   Diagnosis Date Noted  . Abnormality of gait 10/11/2013  . S/P arthroscopy of right knee 10/09/2013  . Radicular pain of right lower extremity 07/25/2013  . Knee instability 07/25/2013  . Right knee pain 07/25/2013  . Chest pain 04/25/2013  . Patellar tendonitis 09/07/2012  . OA (osteoarthritis) of knee 02/09/2012  . GERD (gastroesophageal reflux disease) 12/01/2011  . Dysphagia 12/01/2011  . RUQ pain 12/01/2011  . Fatty liver  12/01/2011  . Hematochezia 12/01/2011  . INTERMITTENT VERTIGO 09/12/2010  . KNEE, ARTHRITIS, DEGEN./OSTEO 04/03/2009  . CHEST PAIN,  RECURRENT 03/07/2009  . OTHER DYSPHAGIA 03/07/2009  . PALPITATIONS 02/06/2009  . LIVER FUNCTION TESTS, ABNORMAL, HX OF 01/01/2009  . MORBID OBESITY 12/31/2008  . PUD 12/31/2008  . BACK PAIN 12/24/2008  . ANSERINE BURSITIS 12/24/2008  . BENIGN POSITIONAL VERTIGO 07/16/2008  . H N P-LUMBAR 04/02/2008  . SPINAL STENOSIS, CERVICAL 02/15/2008  . SPINAL STENOSIS, LUMBAR 02/15/2008  . Patellar tendinitis 10/19/2007  . TEAR LATERAL MENISCUS 06/13/2007  . OBESITY NOS 10/29/2006  . ALLERGIC RHINITIS, SEASONAL 10/29/2006  . HYPOTHYROIDISM 09/07/2006  . HYPERLIPIDEMIA 09/07/2006  . HYPERTENSION 09/07/2006  . GERD 09/07/2006  . DIVERTICULOSIS, COLON 09/07/2006  . OVERACTIVE BLADDER 09/07/2006  . FIBROCYSTIC BREAST DISEASE 09/07/2006  . OSTEOARTHRITIS 09/07/2006    PT - End of Session Activity Tolerance: Patient tolerated treatment well General Behavior During Therapy: [facility] for tasks assessed/performed  GP    Aldona Lento 10/25/2013, 12:13 PM

## 2013-10-27 ENCOUNTER — Ambulatory Visit (HOSPITAL_COMMUNITY)
Admission: RE | Admit: 2013-10-27 | Discharge: 2013-10-27 | Disposition: A | Discharge status: Home / Self Care | Payer: PRIVATE HEALTH INSURANCE | Source: Ambulatory Visit | Attending: Family Medicine | Admitting: Family Medicine

## 2013-10-27 DIAGNOSIS — M25561 Pain in right knee: Secondary | ICD-10-CM

## 2013-10-27 DIAGNOSIS — R269 Unspecified abnormalities of gait and mobility: Secondary | ICD-10-CM

## 2013-10-27 NOTE — Progress Notes (Addendum)
Physical Therapy Treatment Patient Details  Name: Sara [name] MRN: [id number] Date of Birth: 03/07/1946  Today's Date: 10/27/2013 Time: 9476-5465 PT Time Calculation (min): 48 min Charge: TE 0354-6568, Ice 1275-1700  Visit#: 8 of 12  Re-eval: 11/10/13 (MD apt 11/07/2013) Assessment Diagnosis: right knee scope 10/06/13 Surgical Date: 10/06/13 Next MD Visit: Dr. Aline Brochure MD feb 17  Authorization: medicaid, [facility] medicare   Authorization Time Period:    Authorization Visit#: 8 of 10   Subjective: Symptoms/Limitations Symptoms: Pt stated minimal pain in peroneal region, pain scale 2-3/10 today Pain Assessment Currently in Pain?: Yes Pain Score: 3  Pain Location: Knee Pain Orientation: Right  Objective:  Exercise/Treatments Aerobic Tread Mill: begin next session Machines for Strengthening Cybex Knee Extension: 1Pl 2x 10x  Cybex Knee Flexion: 3.0 Pl 15x Standing Heel Raises: Limitations Heel Raises Limitations: heel walk, toe walk 2RT Terminal Knee Extension: Right;10 reps;Limitations Terminal Knee Extension Limitations: towel into wall Lateral Step Up: Right;15 reps;Hand Hold: 1;Step Height: 4" Forward Step Up: Right;15 reps;Hand Hold: 1;Step Height: 6" Step Down: Right;10 reps;Hand Hold: 1;Step Height: 4" Rocker Board: 2 minutes;Limitations Rocker Board Limitations: R/L and A/P SLS: Rt 22", Lt 40" max of 3 SLS with Vectors: 5X5" Rt LE Supine Quad Sets: 10 reps Heel Slides: 10 reps   Modalities Modalities: Cryotherapy Cryotherapy Number Minutes Cryotherapy: 10 Minutes Cryotherapy Location: Knee Type of Cryotherapy: Ice pack  Physical Therapy Assessment and Plan PT Assessment and Plan Clinical Impression Statement: Pt progressing well towards goals, increased ease with stair training this session with no facilitation required for proper technique.  AROM 1-118 this session.  Improving Bil LE SLS. PT Plan:  Begin TM next session, continue with balance and stair  training.    Goals Home Exercise Program Pt/caregiver will Perform Home Exercise Program: Independently;For increased ROM;For increased strengthening PT Short Term Goals PT Short Term Goal 1: improve AROM right knee flexion 120 degrres for tranfsers, gait, stairs  PT Short Term Goal 2: Ambulating with cane full weight bearing inside and outside home  , 10 min tolerance  PT Short Term Goal 3: sit from stand minimal use of hands , equal weight B LE  PT Long Term Goals Time to Complete Long Term Goals: 4 weeks PT Long Term Goal 1: MMT right quadriceps and hamstrings  4/5 for leg stability and transfers  PT Long Term Goal 1 - Progress: Progressing toward goal PT Long Term Goal 2: Return to bathing and dressing independently  PT Long Term Goal 2 - Progress: Met Long Term Goal 3: ambulate with cane 15  min level terrain for household and community activity full weight bearing  Long Term Goal 4: ascend and desend 1-2 stairs comfortably and safely one handrail  Long Term Goal 4 Progress: Progressing toward goal  Problem List Patient Active Problem List   Diagnosis Date Noted  . Abnormality of gait 10/11/2013  . S/P arthroscopy of right knee 10/09/2013  . Radicular pain of right lower extremity 07/25/2013  . Knee instability 07/25/2013  . Right knee pain 07/25/2013  . Chest pain 04/25/2013  . Patellar tendonitis 09/07/2012  . OA (osteoarthritis) of knee 02/09/2012  . GERD (gastroesophageal reflux disease) 12/01/2011  . Dysphagia 12/01/2011  . RUQ pain 12/01/2011  . Fatty liver 12/01/2011  . Hematochezia 12/01/2011  . INTERMITTENT VERTIGO 09/12/2010  . KNEE, ARTHRITIS, DEGEN./OSTEO 04/03/2009  . CHEST PAIN, RECURRENT 03/07/2009  . OTHER DYSPHAGIA 03/07/2009  . PALPITATIONS 02/06/2009  . LIVER FUNCTION TESTS, ABNORMAL, HX OF 01/01/2009  .  MORBID OBESITY 12/31/2008  . PUD 12/31/2008  . BACK PAIN 12/24/2008  . ANSERINE BURSITIS 12/24/2008  . BENIGN POSITIONAL VERTIGO 07/16/2008  . H  N P-LUMBAR 04/02/2008  . SPINAL STENOSIS, CERVICAL 02/15/2008  . SPINAL STENOSIS, LUMBAR 02/15/2008  . Patellar tendinitis 10/19/2007  . TEAR LATERAL MENISCUS 06/13/2007  . OBESITY NOS 10/29/2006  . ALLERGIC RHINITIS, SEASONAL 10/29/2006  . HYPOTHYROIDISM 09/07/2006  . HYPERLIPIDEMIA 09/07/2006  . HYPERTENSION 09/07/2006  . GERD 09/07/2006  . DIVERTICULOSIS, COLON 09/07/2006  . OVERACTIVE BLADDER 09/07/2006  . FIBROCYSTIC BREAST DISEASE 09/07/2006  . OSTEOARTHRITIS 09/07/2006    PT - End of Session Activity Tolerance: Patient tolerated treatment well General Behavior During Therapy: [facility] for tasks assessed/performed  GP    Aldona Lento 10/27/2013, 12:00 PM

## 2013-10-30 ENCOUNTER — Ambulatory Visit (HOSPITAL_COMMUNITY)
Admission: RE | Admit: 2013-10-30 | Discharge: 2013-10-30 | Disposition: A | Discharge status: Home / Self Care | Payer: PRIVATE HEALTH INSURANCE | Source: Ambulatory Visit | Attending: Family Medicine | Admitting: Family Medicine

## 2013-10-30 NOTE — Progress Notes (Signed)
Physical Therapy Treatment Patient Details  Name: Sara [name] MRN: [id number] Date of Birth: [birth year]  Today's Date: 10/30/2013 Time: 1102-1148 PT Time Calculation (min): 46 min Visit#: 9 of 12  Re-eval: 11/10/13 (MD apt 11/07/2013) Authorization: medicaid, [employer] medicare   Authorization Visit#: 8 of 10  Charges:  Gait 1102-1110 (8'), therex 1111-1137 (26'), icepack 1138-1148 (10')   Subjective: Symptoms/Limitations Symptoms: Pt states the rockerboard R/L increased her knee swelling after last visit.  States she currently has no pain.   Exercise/Treatments Aerobic Tread Mill: gait trainer footfall, 8' at 0.5 cyc/sec Machines for Strengthening Cybex Knee Extension: 1Pl 2x10 Rt only eccentrically Cybex Knee Flexion: 3.0 Pl 2X15 Standing Heel Raises: Limitations Heel Raises Limitations: heel walk, toe walk 2RT Lateral Step Up: Right;15 reps;Hand Hold: 1;Step Height: 4" Forward Step Up: Right;15 reps;Hand Hold: 1;Step Height: 6" Step Down: Right;10 reps;Hand Hold: 1;Step Height: 4" Rocker Board: 2 minutes Rocker Board Limitations: static balance R/L    Physical Therapy Assessment and Plan PT Assessment and Plan Clinical Impression Statement: Attempted to increase to 1.5 PL on quad machine, however too painful.  Increased concentration on eccentric phase with isolation of Rt quad.  Pt able to complete without pain using 1PL.  changed rockerboard to static balance.  Pt required 1 UE assist to maintain.   PT Plan: Gcode update next visit.  continue to progress functional strength.     Problem List Patient Active Problem List   Diagnosis Date Noted  . Abnormality of gait 10/11/2013  . S/P arthroscopy of right knee 10/09/2013  . Radicular pain of right lower extremity 07/25/2013  . Knee instability 07/25/2013  . Right knee pain 07/25/2013  . Chest pain 04/25/2013  . Patellar tendonitis 09/07/2012  . OA (osteoarthritis) of knee 02/09/2012  . GERD (gastroesophageal reflux  disease) 12/01/2011  . Dysphagia 12/01/2011  . RUQ pain 12/01/2011  . Fatty liver 12/01/2011  . Hematochezia 12/01/2011  . INTERMITTENT VERTIGO 09/12/2010  . KNEE, ARTHRITIS, DEGEN./OSTEO 04/03/2009  . CHEST PAIN, RECURRENT 03/07/2009  . OTHER DYSPHAGIA 03/07/2009  . PALPITATIONS 02/06/2009  . LIVER FUNCTION TESTS, ABNORMAL, HX OF 01/01/2009  . MORBID OBESITY 12/31/2008  . PUD 12/31/2008  . BACK PAIN 12/24/2008  . ANSERINE BURSITIS 12/24/2008  . BENIGN POSITIONAL VERTIGO 07/16/2008  . H N P-LUMBAR 04/02/2008  . SPINAL STENOSIS, CERVICAL 02/15/2008  . SPINAL STENOSIS, LUMBAR 02/15/2008  . Patellar tendinitis 10/19/2007  . TEAR LATERAL MENISCUS 06/13/2007  . OBESITY NOS 10/29/2006  . ALLERGIC RHINITIS, SEASONAL 10/29/2006  . HYPOTHYROIDISM 09/07/2006  . HYPERLIPIDEMIA 09/07/2006  . HYPERTENSION 09/07/2006  . GERD 09/07/2006  . DIVERTICULOSIS, COLON 09/07/2006  . OVERACTIVE BLADDER 09/07/2006  . FIBROCYSTIC BREAST DISEASE 09/07/2006  . OSTEOARTHRITIS 09/07/2006    PT - End of Session Activity Tolerance: Patient tolerated treatment well General Behavior During Therapy: WFL for tasks assessed/performed   Teena Irani, PTA/CLT 10/30/2013, 11:47 AM

## 2013-11-01 ENCOUNTER — Ambulatory Visit (HOSPITAL_COMMUNITY)
Admission: RE | Admit: 2013-11-01 | Discharge: 2013-11-01 | Disposition: A | Discharge status: Home / Self Care | Payer: PRIVATE HEALTH INSURANCE | Source: Ambulatory Visit | Attending: Family Medicine | Admitting: Family Medicine

## 2013-11-01 DIAGNOSIS — M25561 Pain in right knee: Secondary | ICD-10-CM

## 2013-11-01 DIAGNOSIS — R269 Unspecified abnormalities of gait and mobility: Secondary | ICD-10-CM

## 2013-11-01 NOTE — Progress Notes (Signed)
Physical Therapy Treatment Patient Details  Name: Sara [name] MRN: [id number] Date of Birth: 08/14/1946  Today's Date: 11/01/2013 Time: 1100-1145 PT Time Calculation (min): 45 min TE  100- 1145  foto performed for G code  Visit#: 10 of 12  Re-eval: 11/10/13 Assessment Diagnosis: right knee scope 10/06/13 Surgical Date: 10/06/13 Next MD Visit: Dr. Aline Brochure MD feb 17  Authorization: medicaid, [facility] medicare   Authorization Time Period:    Authorization Visit#: 10 of 12   Subjective: Symptoms/Limitations Symptoms: doing well, deneis pain, states pain oerall is intermittent, pleased with her progress, dr appt next wek, having familly issues today so she is very upset, agreed to have therapy  Pain Assessment Currently in Pain?: No/denies     Exercise/Treatments Aerobic Tread Mill: 10 min, 1.2 mph , cues for longer strides    Standing Heel Raises Limitations: heel walk, toe walk 2RT Knee Flexion: Both;10 reps (balance pad ) Forward Step Up: Right;15 reps;Hand Hold: 1;Step Height: 6" Stairs: 1RT reciprocal pattern with 1 HR SLS with Vectors: R and L single leg  3 sec holds, 3 ways , 3 rounds  Other Standing Knee Exercises: tandem gait, retro gait on balance beam 2RT each Other Standing Knee Exercises: standing slow march on balance pad 20x       Physical Therapy Assessment and Plan PT Assessment and Plan Clinical Impression Statement: pateint has greatly improved ROM and strength. plan to discharge next 1-2 visits , R knee AROM 0- 130  PT Plan:  continue to progress functional strength., disharge next 1- 2 visits     Goals PT Short Term Goals Time to Complete Short Term Goals: 2 weeks PT Short Term Goal 1: improve AROM right knee flexion 120 degrres for tranfsers, gait, stairs  PT Short Term Goal 1 - Progress: Met PT Short Term Goal 2: Ambulating with cane full weight bearing inside and outside home  , 10 min tolerance  PT Short Term Goal 2 - Progress: Met PT Short  Term Goal 3: sit from stand minimal use of hands , equal weight B LE  PT Short Term Goal 3 - Progress: Met PT Long Term Goals Time to Complete Long Term Goals: 4 weeks PT Long Term Goal 1: MMT right quadriceps and hamstrings  4/5 for leg stability and transfers  PT Long Term Goal 1 - Progress: Met PT Long Term Goal 2: Return to bathing and dressing independently  PT Long Term Goal 2 - Progress: Met Long Term Goal 3: ambulate with cane 15  min level terrain for household and community activity full weight bearing  Long Term Goal 3 Progress: Met Long Term Goal 4: ascend and desend 1-2 stairs comfortably and safely one handrail  Long Term Goal 4 Progress: Met  Problem List Patient Active Problem List   Diagnosis Date Noted  . Abnormality of gait 10/11/2013  . S/P arthroscopy of right knee 10/09/2013  . Radicular pain of right lower extremity 07/25/2013  . Knee instability 07/25/2013  . Right knee pain 07/25/2013  . Chest pain 04/25/2013  . Patellar tendonitis 09/07/2012  . OA (osteoarthritis) of knee 02/09/2012  . GERD (gastroesophageal reflux disease) 12/01/2011  . Dysphagia 12/01/2011  . RUQ pain 12/01/2011  . Fatty liver 12/01/2011  . Hematochezia 12/01/2011  . INTERMITTENT VERTIGO 09/12/2010  . KNEE, ARTHRITIS, DEGEN./OSTEO 04/03/2009  . CHEST PAIN, RECURRENT 03/07/2009  . OTHER DYSPHAGIA 03/07/2009  . PALPITATIONS 02/06/2009  . LIVER FUNCTION TESTS, ABNORMAL, HX OF 01/01/2009  . MORBID OBESITY 12/31/2008  .  PUD 12/31/2008  . BACK PAIN 12/24/2008  . ANSERINE BURSITIS 12/24/2008  . BENIGN POSITIONAL VERTIGO 07/16/2008  . H N P-LUMBAR 04/02/2008  . SPINAL STENOSIS, CERVICAL 02/15/2008  . SPINAL STENOSIS, LUMBAR 02/15/2008  . Patellar tendinitis 10/19/2007  . TEAR LATERAL MENISCUS 06/13/2007  . OBESITY NOS 10/29/2006  . ALLERGIC RHINITIS, SEASONAL 10/29/2006  . HYPOTHYROIDISM 09/07/2006  . HYPERLIPIDEMIA 09/07/2006  . HYPERTENSION 09/07/2006  . GERD 09/07/2006  .  DIVERTICULOSIS, COLON 09/07/2006  . OVERACTIVE BLADDER 09/07/2006  . FIBROCYSTIC BREAST DISEASE 09/07/2006  . OSTEOARTHRITIS 09/07/2006    PT - End of Session Activity Tolerance: Patient tolerated treatment well PT Plan of Care PT Patient Instructions: scar massage at incision portals   GP Functional Assessment Tool Used: Foto score this visit   10th visit  45 score  Mobility: Walking and Moving Around Current Status [phone]): At least 40 percent but less than 60 percent impaired, limited or restricted Mobility: Walking and Moving Around Goal Status [phone]): At least 40 percent but less than 60 percent impaired, limited or restricted  Sara [name] 11/01/2013, 12:06 PM

## 2013-11-03 ENCOUNTER — Ambulatory Visit (HOSPITAL_COMMUNITY)
Admission: RE | Admit: 2013-11-03 | Discharge: 2013-11-03 | Disposition: A | Discharge status: Home / Self Care | Payer: PRIVATE HEALTH INSURANCE | Source: Ambulatory Visit | Attending: Family Medicine | Admitting: Family Medicine

## 2013-11-03 DIAGNOSIS — M25561 Pain in right knee: Secondary | ICD-10-CM

## 2013-11-03 DIAGNOSIS — R269 Unspecified abnormalities of gait and mobility: Secondary | ICD-10-CM

## 2013-11-03 NOTE — Evaluation (Signed)
Physical Therapy Discharge   Patient Details  Name: Sara [name] MRN: [id number] Date of Birth: Jan 31, 1946  Today's Date: 11/03/2013 Time: 1100-1140 PT Time Calculation (min): 40 min   TE 1100- 1140            Visit#: 11 of 12  Re-eval: 11/10/13 Assessment Diagnosis: right knee scope 10/06/13 Surgical Date: 10/06/13 Next MD Visit: Dr. Aline Brochure MD feb 17  Authorization: medicaid, [facility] medicare      Past Surgical History:  Past Surgical History  Procedure Laterality Date  . Knee surgery      arthroscopy-- R  . Tonsillectomy    . Appendectomy    . Colonoscopy  Sept 2008    RMR: normal rectum, left-sided diverticula, repeat in 2018  . Abdominal hysterectomy    . Upper gastrointestinal endoscopy    . Breast lumpectomy      left   . Eye surgery      cataracts removed- bilateral, /w IOL  . Lumpectomy on pelvis    . Diagnostic laparoscopy    . Tubal ligation    . Esophagogastroduodenoscopy    . Anterior lat lumbar fusion  05/13/2012    Procedure: ANTERIOR LATERAL LUMBAR FUSION 2 LEVELS;  Surgeon: Faythe Ghee, MD;  Location: [facility];  Service: Neurosurgery;  Laterality: Left;  Left Lumbar Three-four,Lumbar four-five Extreme Lumbar Interbody Fusion with Percutaneous Pedicle Screws  . Spinal fusion    . Knee arthroscopy with lateral menisectomy Right 10/06/2013    Procedure: KNEE ARTHROSCOPY WITH LATERAL AND MEDIAL MENISECTOMY;  Surgeon: Carole Civil, MD;  Location: [facility];  Service: Orthopedics;  Laterality: Right;  END @ 1234  . Foreign body removal Right 10/06/2013    Procedure: REMOVAL FOREIGN BODY EXTREMITY;  Surgeon: Carole Civil, MD;  Location: [facility];  Service: Orthopedics;  Laterality: Right;  . Injection knee Left 10/06/2013    Procedure: KNEE INJECTION;  Surgeon: Carole Civil, MD;  Location: [facility];  Service: Orthopedics;  Laterality: Left;    Subjective Symptoms/Limitations Symptoms: no pain, doing great, went shopping using cart the other  day , dr appt next tuesday, erady for physical therapy discharge  Pertinent History: prior right knee scope 2008, back surgery 2013 , recent left knee injection for pain   Patient Stated Goals: goals are to return  to walking in the neighborhood without cane  Pain Assessment Currently in Pain?: No/denies  Assessment RLE AROM (degrees) Right Knee Extension: 0 Right Knee Flexion: 130 RLE Strength Right Hip ABduction: 5/5 Right Knee Flexion: 5/5 Right Knee Extension: 5/5  Exercise/Treatments Mobility/Balance  Ambulation/Gait Yes Gait Pattern: Within Functional Limits;Step-through pattern Stairs: Yes, reciprocal pattern   Stretches Gastroc Stretch: 30 seconds;3 reps Aerobic Tread Mill: 10 min, 1.4  mph  Machines for Strengthening Cybex Knee Extension: 1Pl 2x10 Rt only eccentrically Cybex Knee Flexion: 3.0 Pl 2X15   Standing Heel Raises: 20 reps Knee Flexion: Both;10 reps Forward Step Up: Right;15 reps;Hand Hold: 1;Step Height: 6" SLS with Vectors: R and L 3 way vectors on balane beam 5 reps each direction  Other Standing Knee Exercises: tandem gait, retro gait on balance beam 2RT each Other Standing Knee Exercises: standing slow march on balance pad 20x   Physical Therapy Assessment and Plan PT Assessment and Plan Clinical Impression Statement: goals met, normal gait restored  PT Plan: discharge     Goals Home Exercise Program Pt/caregiver will Perform Home Exercise Program: Independently;For increased ROM;For increased strengthening PT Goal: Perform Home Exercise Program -  Progress: Met PT Short Term Goals Time to Complete Short Term Goals: 2 weeks PT Short Term Goal 1: improve AROM right knee flexion 120 degrres for tranfsers, gait, stairs  PT Short Term Goal 1 - Progress: Met PT Short Term Goal 2: Ambulating with cane full weight bearing inside and outside home  , 10 min tolerance  PT Short Term Goal 2 - Progress: Met PT Short Term Goal 3: sit from stand minimal use  of hands , equal weight B LE  PT Short Term Goal 3 - Progress: Met PT Long Term Goals Time to Complete Long Term Goals: 4 weeks PT Long Term Goal 1: MMT right quadriceps and hamstrings  4/5 for leg stability and transfers  PT Long Term Goal 1 - Progress: Met PT Long Term Goal 2: Return to bathing and dressing independently  PT Long Term Goal 2 - Progress: Met Long Term Goal 3: ambulate with cane 15  min level terrain for household and community activity full weight bearing  Long Term Goal 3 Progress: Met Long Term Goal 4 Progress: Met  Problem List Patient Active Problem List   Diagnosis Date Noted  . Abnormality of gait 10/11/2013  . S/P arthroscopy of right knee 10/09/2013  . Radicular pain of right lower extremity 07/25/2013  . Knee instability 07/25/2013  . Right knee pain 07/25/2013  . Chest pain 04/25/2013  . Patellar tendonitis 09/07/2012  . OA (osteoarthritis) of knee 02/09/2012  . GERD (gastroesophageal reflux disease) 12/01/2011  . Dysphagia 12/01/2011  . RUQ pain 12/01/2011  . Fatty liver 12/01/2011  . Hematochezia 12/01/2011  . INTERMITTENT VERTIGO 09/12/2010  . KNEE, ARTHRITIS, DEGEN./OSTEO 04/03/2009  . CHEST PAIN, RECURRENT 03/07/2009  . OTHER DYSPHAGIA 03/07/2009  . PALPITATIONS 02/06/2009  . LIVER FUNCTION TESTS, ABNORMAL, HX OF 01/01/2009  . MORBID OBESITY 12/31/2008  . PUD 12/31/2008  . BACK PAIN 12/24/2008  . ANSERINE BURSITIS 12/24/2008  . BENIGN POSITIONAL VERTIGO 07/16/2008  . H N P-LUMBAR 04/02/2008  . SPINAL STENOSIS, CERVICAL 02/15/2008  . SPINAL STENOSIS, LUMBAR 02/15/2008  . Patellar tendinitis 10/19/2007  . TEAR LATERAL MENISCUS 06/13/2007  . OBESITY NOS 10/29/2006  . ALLERGIC RHINITIS, SEASONAL 10/29/2006  . HYPOTHYROIDISM 09/07/2006  . HYPERLIPIDEMIA 09/07/2006  . HYPERTENSION 09/07/2006  . GERD 09/07/2006  . DIVERTICULOSIS, COLON 09/07/2006  . OVERACTIVE BLADDER 09/07/2006  . FIBROCYSTIC BREAST DISEASE 09/07/2006  . OSTEOARTHRITIS  09/07/2006   GP Functional Assessment Tool Used: Foto score this visit    45 score  Functional Limitation: Mobility: Walking and moving around Mobility: Walking and Moving Around Goal Status [phone]): At least 40 percent but less than 60 percent impaired, limited or restricted Mobility: Walking and Moving Around Discharge Status [phone]): At least 40 percent but less than 60 percent impaired, limited or restricted  Demarkus Remmel 11/03/2013, 12:00 PM  Physician Documentation Your signature is required to indicate approval of the treatment plan as stated above.  Please sign and either send electronically or make a copy of this report for your files and return this physician signed original.   Please mark one 1.__approve of plan  2. ___approve of plan with the following conditions.   ______________________________                                                          _____________________  Physician Signature                                                                                                             Date

## 2013-11-07 ENCOUNTER — Ambulatory Visit: Payer: PRIVATE HEALTH INSURANCE | Admitting: Orthopedic Surgery

## 2013-11-14 ENCOUNTER — Ambulatory Visit: Payer: PRIVATE HEALTH INSURANCE | Admitting: Orthopedic Surgery

## 2013-11-21 ENCOUNTER — Ambulatory Visit (INDEPENDENT_AMBULATORY_CARE_PROVIDER_SITE_OTHER): Payer: PRIVATE HEALTH INSURANCE | Admitting: Orthopedic Surgery

## 2013-11-21 VITALS — BP 105/62 | Ht 66.0 in | Wt 217.0 lb

## 2013-11-21 DIAGNOSIS — Z9889 Other specified postprocedural states: Secondary | ICD-10-CM

## 2013-11-21 DIAGNOSIS — M7050 Other bursitis of knee, unspecified knee: Secondary | ICD-10-CM

## 2013-11-21 DIAGNOSIS — M7630 Iliotibial band syndrome, unspecified leg: Secondary | ICD-10-CM

## 2013-11-21 DIAGNOSIS — M25569 Pain in unspecified knee: Secondary | ICD-10-CM

## 2013-11-21 DIAGNOSIS — IMO0002 Reserved for concepts with insufficient information to code with codable children: Secondary | ICD-10-CM

## 2013-11-21 DIAGNOSIS — M629 Disorder of muscle, unspecified: Secondary | ICD-10-CM

## 2013-11-21 DIAGNOSIS — M1710 Unilateral primary osteoarthritis, unspecified knee: Secondary | ICD-10-CM

## 2013-11-21 DIAGNOSIS — M2420 Disorder of ligament, unspecified site: Secondary | ICD-10-CM

## 2013-11-21 DIAGNOSIS — M179 Osteoarthritis of knee, unspecified: Secondary | ICD-10-CM

## 2013-11-21 MED ORDER — OXYCODONE-ACETAMINOPHEN 5-325 MG PO TABS: 1.0000 | ORAL_TABLET | ORAL | Status: DC | PRN

## 2013-11-22 ENCOUNTER — Encounter: Payer: Self-pay | Admitting: Orthopedic Surgery

## 2013-11-22 NOTE — Progress Notes (Signed)
Patient ID: Sara [name], female   DOB: [birth year], 68 y.o.   MRN: [id number]  Chief Complaint  Patient presents with  . Follow-up    Right knee post op SARK DOS 10/06/13    HISTORY: 68 year old female status post arthroscopy right knee completed rehabilitation he is doing well overall in terms of her intra-articular symptoms however she is having pain at the insertion of the medial bursa as well as the iliotibial band. She's regained her ability to walk without support she has regained full range of motion she has excellent strength in the right knee  She is tender over the medial bursal structures as well as the iliotibial band. I've offered her injection 2 alleviate this and she agrees  2 injections were performed to 1 over the iliotibial band insertion and 1 over the medial or subtle tissue insertions  Knee, bursa  Injection Procedure Note  Pre-operative Diagnosis: ilio tibial band and pes  bursitis  Post-operative Diagnosis: same  Indications: pain  Anesthesia: ethyl chloride   Procedure Details   Verbal consent was obtained for the procedure. Time out was completed.The bursal area   was prepped with alcohol, followed by  Ethyl chloride spray and A 25 gauge needle was inserted into the ilio tibial band insertion and the  pes bursa and  63ml 1% lidocaine and 1 ml of depomedrol  was then injected into the area  . The needle was removed and the area cleansed and dressed.  Complications:  None; patient tolerated the procedure well.  The patient will return in approximately 2 weeks and repeat injection if needed

## 2013-12-05 ENCOUNTER — Encounter: Payer: Self-pay | Admitting: Orthopedic Surgery

## 2013-12-05 ENCOUNTER — Ambulatory Visit (INDEPENDENT_AMBULATORY_CARE_PROVIDER_SITE_OTHER): Payer: PRIVATE HEALTH INSURANCE | Admitting: Orthopedic Surgery

## 2013-12-05 VITALS — BP 102/56 | Ht 66.0 in | Wt 217.0 lb

## 2013-12-05 DIAGNOSIS — M629 Disorder of muscle, unspecified: Secondary | ICD-10-CM

## 2013-12-05 DIAGNOSIS — M1710 Unilateral primary osteoarthritis, unspecified knee: Secondary | ICD-10-CM

## 2013-12-05 DIAGNOSIS — M7630 Iliotibial band syndrome, unspecified leg: Secondary | ICD-10-CM

## 2013-12-05 DIAGNOSIS — Z9889 Other specified postprocedural states: Secondary | ICD-10-CM

## 2013-12-05 DIAGNOSIS — IMO0002 Reserved for concepts with insufficient information to code with codable children: Secondary | ICD-10-CM

## 2013-12-05 DIAGNOSIS — M7050 Other bursitis of knee, unspecified knee: Secondary | ICD-10-CM

## 2013-12-05 DIAGNOSIS — M179 Osteoarthritis of knee, unspecified: Secondary | ICD-10-CM

## 2013-12-05 NOTE — Progress Notes (Signed)
Patient ID: Sara [name], female   DOB: [birth year], 68 y.o.   MRN: [id number]  Chief Complaint  Patient presents with  . Follow-up    2 week post injection for bursitis and recheck right knee 8 weeks s/p arthroscopy DOS 10/06/13    BP 102/56  Ht 5\' 6"  (1.676 m)  Wt 217 lb (98.431 kg)  BMI 35.04 kg/m2  The patient reports that her areas of bursitis and tendinitis are still somewhat sore but she is also having lateral joint line pain causing some giving way symptoms and painful ambulation  Or lateral joint line is tender the iliotibial band is less tender the medial bursitis area is somewhat tender but improved  Recommend intra-articular injection return in 3 weeks.  Encounter Diagnoses  Name Primary?  . S/P arthroscopy of right knee Yes  . OA (osteoarthritis) of knee   . Pes anserinus bursitis   . Iliotibial band tendonitis     Knee  Injection Procedure Note  Pre-operative Diagnosis: right knee  arthritis Post-operative Diagnosis: same  Indications:  arthritis  Anesthesia: Ethyl chloride  Procedure Details   Verbal consent was obtained for the procedure. The timeout was taken to confirm the right knee as a surgical site  The skin was prepped with alcohol and ethyl chloride was sprayed on the knee  Medications used include Depo-Medrol 40 mg and lidocaine 1% 3 cc  After anesthesia the knee was injected from a lateral approach was well tolerated without complications

## 2013-12-05 NOTE — Patient Instructions (Signed)
  Use MaxFreeze 3 times a day   Joint Injection  Care After  Refer to this sheet in the next few days. These instructions provide you with information on caring for yourself after you have had a joint injection. Your caregiver also may give you more specific instructions. Your treatment has been planned according to current medical practices, but problems sometimes occur. Call your caregiver if you have any problems or questions after your procedure.  After any type of joint injection, it is not uncommon to experience:  Soreness, swelling, or bruising around the injection site.  Mild numbness, tingling, or weakness around the injection site caused by the numbing medicine used before or with the injection. It also is possible to experience the following effects associated with the specific agent after injection:  Iodine-based contrast agents:  Allergic reaction (itching, hives, widespread redness, and swelling beyond the injection site).  Corticosteroids (These effects are rare.):  Allergic reaction.  Increased blood sugar levels (If you have diabetes and you notice that your blood sugar levels have increased, notify your caregiver).  Increased blood pressure levels.  Mood swings.  Hyaluronic acid in the use of viscosupplementation.  Temporary heat or redness.  Temporary rash and itching.  Increased fluid accumulation in the injected joint. These effects all should resolve within a day after your procedure.  HOME CARE INSTRUCTIONS  Limit yourself to light activity the day of your procedure. Avoid lifting heavy objects, bending, stooping, or twisting.  Take prescription or over-the-counter pain medication as directed by your caregiver.  You may apply ice to your injection site to reduce pain and swelling the day of your procedure. Ice may be applied 3-4 times:  Put ice in a plastic bag.  Place a towel between your skin and the bag.  Leave the ice on for no longer than 15-20 minutes each  time. SEEK IMMEDIATE MEDICAL CARE IF:  Pain and swelling get worse rather than better or extend beyond the injection site.  Numbness does not go away.  Blood or fluid continues to leak from the injection site.  You have chest pain.  You have swelling of your face or tongue.  You have trouble breathing or you become dizzy.  You develop a fever, chills, or severe tenderness at the injection site that last longer than 1 day. MAKE SURE YOU:  Understand these instructions.  Watch your condition.  Get help right away if you are not doing well or if you get worse. Document Released: 05/21/2011 Document Revised: 11/30/2011 Document Reviewed: 05/21/2011  [facility] Patient Information 2014 [place].

## 2014-01-02 ENCOUNTER — Ambulatory Visit (INDEPENDENT_AMBULATORY_CARE_PROVIDER_SITE_OTHER): Payer: Self-pay | Admitting: Orthopedic Surgery

## 2014-01-02 ENCOUNTER — Telehealth: Payer: Self-pay | Admitting: Orthopedic Surgery

## 2014-01-02 VITALS — BP 103/55 | Ht 66.0 in | Wt 217.0 lb

## 2014-01-02 DIAGNOSIS — IMO0002 Reserved for concepts with insufficient information to code with codable children: Secondary | ICD-10-CM

## 2014-01-02 DIAGNOSIS — M179 Osteoarthritis of knee, unspecified: Secondary | ICD-10-CM

## 2014-01-02 DIAGNOSIS — M1710 Unilateral primary osteoarthritis, unspecified knee: Secondary | ICD-10-CM

## 2014-01-02 MED ORDER — HYDROCODONE-ACETAMINOPHEN 10-325 MG PO TABS: 1.0000 | ORAL_TABLET | ORAL | Status: DC | PRN

## 2014-01-02 NOTE — Telephone Encounter (Signed)
Sara [name] said she and her husband have discussed the knee surgery and have decided she can have it done soon as possible.  Her # [phone]

## 2014-01-02 NOTE — Telephone Encounter (Signed)
Scheduled for right total knee arthroplasty on a Wednesday

## 2014-01-02 NOTE — Patient Instructions (Signed)
Preparing for Knee Replacement  Recovery from knee replacement surgery can be made easier and more comfortable by taking steps to be prepared before surgery. This includes:  · Arranging for others to help you.  · Preparing your home.  · Making sure your body is prepared by doing a pre-operative exam and being as healthy as you can.  · Doing exercises before your surgery as told by your caregiver.  Also, you can ease any concerns about your financial responsibilities by calling your insurance company after you decide to have surgery. In addition to your surgery and hospital stay, you will want to ask about your coverage for medical equipment, rehab facilities, and home care.  ARRANGING FOR HELP   You will be getting stronger and more mobile every day. However, in the first couple weeks after surgery, it is unlikely you will be able to do all your daily activities as easily as before your surgery. You will tire easily and still have limited movement in your leg. Follow these guidelines to best arrange for the help you may need after your surgery:  · Arrange for someone to drive you home from the hospital. Your surgeon will be able to tell you how many days you can expect to be in the hospital.  · Cancel all work, care-giving, and volunteer responsibilities for at least 4 to 6 weeks after your surgery.  · If you live alone, arrange for someone to care for your home and pets for the first 4 to 6 weeks.  · Select someone with whom you feel comfortable to be with you day and night for the first week. This person will help you with your exercises and personal care, like bathing and using the toilet.  · Arrange for drivers to bring you to and from your doctor and therapy appointments, as well as to the grocery store and other places you need to go, for at least 4 to 6 weeks.  · Select 2 or 3 rehab facilities where you would be comfortable recovering just in case you are not able to go directly home to recover.  PREPARING  YOUR HOME  · Remove all clutter from your floors.  · To see if you will be able to move in these spaces with a wheeled walker, hold your hands out about 6 inches from your sides. Then walk from your bed to the bathroom. Walk from your resting spot to your kitchen and bathroom. If you do not hit anything with your hands, you probably have enough room.  · Remove throw rugs.  · Move the items you use often to shelves and drawers that are at countertop height.  Move items in your bathroom, kitchen, and bedroom.  · Prepare a few meals that you can freeze and reheat later.  · Do not plan on recovering in bed.  It is better for your health to sit upright. You may wish to use a recliner with a small table nearby. Keep the items you use most frequently on that table. These may include the TV remote, a cordless phone, a book or laptop computer, water glass, and any other items of your choice.  · Consider adding grab bars in the shower and near the toilet.  · While you are in the hospital, you will learn about equipment helpful for your recovery. Some of the equipment includes raised toilet seats, tub benches, and shower benches. Often, your hospital care team will help you decide what you need and can direct you about where to buy these items. You may not need all   of these items, and they are not often returnable, so it is not recommended that you buy them before going to the hospital.  PREPARING YOUR BODY  · Complete a pre-operative exam. This will ensure that your body is healthy enough to safely complete this surgery. Be certain to bring a complete list of all your medicines and supplements (herbs and vitamins). You may be advised to have additional tests to ensure your safety.  · Complete elective dental care and routine cleanings before the surgery. Germs from anywhere in your body, including those in your mouth, can travel to your new joint and infect it.  It is important not to have any dental work performed for at  least 3 months after your surgery. After surgery, be sure to tell your dentist about your joint replacement.  · Maintain a healthy diet. Unless advised by your surgeon, do not drastically change your diet before your surgery.  · Quit smoking. Get help from your caregiver if you need it.  · The day before your surgery, follow your surgeon's directions for showering, eating and drinking. These directions are for your safety.  EXERCISES  Your caregiver may have you do the following exercises before your surgery. Be sure to follow the exercise program your caregiver prescribes for you. Doing the exercises on both sides will help prepare your "good" side as well. While completing these exercises, remember:   · Stretch as long as you can, up to 30 seconds, without pain developing.  · You should only feel a gentle lengthening or release in the stretched tissue.  Ankle Pumps  1. While sitting with your knees straight, draw the top of your feet upwards by flexing your ankles.   2. Then, reverse the motion, pointing your toes downward.  3. Repeat 10 to 20 times. Complete this exercise 1 to 2 times per day.  Heel Slides  1. Lie on your back with both knees straight. (If this causes back discomfort, bend your opposite knee, placing your foot flat on the floor.)  2. Slowly slide your heel back toward your buttocks until you feel a gentle stretch in the front of your knee or thigh.  3. Slowly slide your heel back to the starting position.  4. Repeat 10 to 20 times. Complete this exercise 1 to 2 times per day.  Quadriceps Sets  1. Lie on your back with your sore leg extended and your opposite knee bent.  2. Gradually tense the muscles in the front of your thigh. You should see either your kneecap slide up toward your hip or increased dimpling just above the knee. This motion will push the back of the knee down toward the floor.   3. Hold the muscle as tight as you can without increasing your pain for 10 seconds.  4. Relax the  muscles slowly and completely in between each repetition.  5. Repeat 10 to 20 times. Complete this exercise 1 to 2 times per day.  Short Arc Kicks  1. Lie on your back. Place a 4 to 6 inch towel roll under your sore knee so that the knee slightly bends.  2. Raise only your lower leg by tightening the muscles in the front of your thigh. Do not allow your thigh to rise.  3. Hold this position for 5 seconds.  4. Repeat 10 to 20 times. Complete this exercise 1 to 2 times per day.  Straight Leg Raises  1. Lie on your back with your sore leg extended and your opposite knee bent.   2. Tense the muscles in the front of your sore thigh. You should see either   your kneecap slide up or increased dimpling just above the knee. Your thigh may even quiver.  3. Tighten these muscles even more and raise your leg 4 to 6 inches off the floor. Hold for 3 to 5 seconds.  4. Keeping these muscles tense, lower your leg.  5. Relax the muscles slowly and completely in between each repetition.  6. Repeat 10 to 20 times. Complete this exercise 1 to 2 times per day.  Arm Chair Push-ups  1. Find a firm, non-wheeled chair with solid armrests.  2. Sitting in the chair, extend your sore leg straight out in front of you.  3. Lift up your body weight, using your arms and opposite leg.  4. Slowly lower your body weight.   5. Repeat 10 to 20 times. Complete this exercise 1 to 2 times per day.  Document Released: 12/12/2010 Document Revised: 11/30/2011 Document Reviewed: 12/12/2010  [employer]® Patient Information ©2014 [employer].

## 2014-01-02 NOTE — Progress Notes (Signed)
Patient ID: Sara [name], female   DOB: 1945/09/29, 68 y.o.   MRN: [id number]  Chief Complaint  Patient presents with  . Follow-up    3 week recheck right knee s/p injection [sark 10/06/13]    Postop period visit the patient continues to have diffuse knee pain medial lateral and has some posterior knee pain status post lumbar spine surgery 2 years ago I think she has not responded well to the knee arthroscopy and meniscectomy is. She had a grade 4 chondral lesion laterally and this is bothering her think she will need knee replacement surgery she will discuss it with her husband.

## 2014-01-03 ENCOUNTER — Telehealth: Payer: Self-pay | Admitting: Orthopedic Surgery

## 2014-01-03 ENCOUNTER — Other Ambulatory Visit: Payer: Self-pay | Admitting: *Deleted

## 2014-01-03 ENCOUNTER — Encounter (HOSPITAL_COMMUNITY): Payer: Self-pay | Admitting: Pharmacy Technician

## 2014-01-03 MED ORDER — BUPIVACAINE LIPOSOME 1.3 % IJ SUSP: 20.0000 mL | Freq: Once | INTRAMUSCULAR | Status: DC

## 2014-01-03 NOTE — Telephone Encounter (Signed)
Regarding in-patient surgery scheduled 01/17/14, CPT 65035, ICD-9 715.96, at [facility], contacted insurer(s):  (1) [employer] (primary) - ph# [phone]      01/03/14 - spoke with Janalee Dane; received REF # [id number], 01/03/14, 5:41p.m.  States a case manager will contact if additional clinicals are needed.  *Follow/up by 01/05/14 on status.    (2) Medicaid, ph [phone], nctracks.gov website, no pre-authorization required.  Faxed copy of [place] [facility] request form to fax [phone].

## 2014-01-04 NOTE — Telephone Encounter (Signed)
Scheduled for surgery 01/17/14.

## 2014-01-09 NOTE — Telephone Encounter (Signed)
Received call back from [facility] clinical coordinator, Genesis M, requesting clinicals to be faxed to: Fax# [phone], his direct ph# [phone], ext [phone].  Faxed as requested.

## 2014-01-10 NOTE — Telephone Encounter (Signed)
UPDATE 01/10/14: Date of surgery re-scheduled to 01/24/14 per Dr Aline Brochure, patient aware. Notified [employer] per fax to same [phone] + per phone with representative, Faythe Dingwall,  01/10/14 6:15pm -Status still pending review of clinicals at this time.

## 2014-01-12 ENCOUNTER — Inpatient Hospital Stay (HOSPITAL_COMMUNITY): Admission: RE | Admit: 2014-01-12 | Payer: PRIVATE HEALTH INSURANCE | Source: Ambulatory Visit

## 2014-01-18 ENCOUNTER — Telehealth: Payer: Self-pay | Admitting: Orthopedic Surgery

## 2014-01-18 ENCOUNTER — Encounter (HOSPITAL_COMMUNITY): Payer: Self-pay

## 2014-01-18 ENCOUNTER — Other Ambulatory Visit: Payer: Self-pay | Admitting: Orthopedic Surgery

## 2014-01-18 ENCOUNTER — Encounter (HOSPITAL_COMMUNITY)
Admission: RE | Admit: 2014-01-18 | Discharge: 2014-01-18 | Disposition: A | Discharge status: Home / Self Care | Payer: PRIVATE HEALTH INSURANCE | Source: Ambulatory Visit | Attending: Orthopedic Surgery | Admitting: Orthopedic Surgery

## 2014-01-18 DIAGNOSIS — M179 Osteoarthritis of knee, unspecified: Secondary | ICD-10-CM

## 2014-01-18 DIAGNOSIS — M1710 Unilateral primary osteoarthritis, unspecified knee: Secondary | ICD-10-CM

## 2014-01-18 DIAGNOSIS — Z01812 Encounter for preprocedural laboratory examination: Secondary | ICD-10-CM | POA: Insufficient documentation

## 2014-01-18 LAB — BASIC METABOLIC PANEL
BUN: 8 mg/dL (ref 6–23)
CO2: 27 mEq/L (ref 19–32)
Calcium: 9.4 mg/dL (ref 8.4–10.5)
Chloride: 97 mEq/L (ref 96–112)
Creatinine, Ser: 0.83 mg/dL (ref 0.50–1.10)
GFR calc Af Amer: 83 mL/min — ABNORMAL LOW (ref >90)
GFR calc non Af Amer: 72 mL/min — ABNORMAL LOW (ref >90)
Glucose, Bld: 67 mg/dL — ABNORMAL LOW (ref 70–99)
Potassium: 3.7 mEq/L (ref 3.7–5.3)
Sodium: 139 mEq/L (ref 137–147)

## 2014-01-18 LAB — CBC
HCT: 45 % (ref 36.0–46.0)
Hemoglobin: 15.1 g/dL — ABNORMAL HIGH (ref 12.0–15.0)
MCH: 31.4 pg (ref 26.0–34.0)
MCHC: 33.6 g/dL (ref 30.0–36.0)
MCV: 93.6 fL (ref 78.0–100.0)
Platelets: 229 10*3/uL (ref 150–400)
RBC: 4.81 MIL/uL (ref 3.87–5.11)
RDW: 13.4 % (ref 11.5–15.5)
WBC: 7.7 10*3/uL (ref 4.0–10.5)

## 2014-01-18 LAB — ABO/RH: ABO/RH(D): B POS

## 2014-01-18 LAB — SURGICAL PCR SCREEN
MRSA, PCR: NEGATIVE
Staphylococcus aureus: NEGATIVE

## 2014-01-18 LAB — PREPARE RBC (CROSSMATCH)

## 2014-01-18 MED ORDER — HYDROCODONE-ACETAMINOPHEN 10-325 MG PO TABS: 1.0000 | ORAL_TABLET | ORAL | Status: DC | PRN

## 2014-01-18 NOTE — Telephone Encounter (Signed)
Sara [name] asked for a new prescription for Hydrocodone 10/325 Her # [phone]

## 2014-01-18 NOTE — Telephone Encounter (Signed)
Refilled and patient notified prescription was ready for pick up.

## 2014-01-18 NOTE — Telephone Encounter (Signed)
01/18/14 per [employer] online Notification status:"Approved" under same Reference/ Pre-Auth# [id number].

## 2014-01-18 NOTE — Patient Instructions (Signed)
Attallah B Fryman  01/18/2014   Your procedure is scheduled on:  01/24/2014  Report to [facility] at  850  AM.  Call this number if you have problems the morning of surgery: [phone]   Remember:   Do not eat food or drink liquids after midnight.   Take these medicines the morning of surgery with A SIP OF WATER:  Neurontin, hydrocodone, levothyroxine, lorazepam, losartan, metoprolol    Do not wear jewelry, make-up or nail polish.  Do not wear lotions, powders, or perfumes.   Do not shave 48 hours prior to surgery. Men may shave face and neck.  Do not bring valuables to the hospital.  [facility] is not responsible for any belongings or valuables.               Contacts, dentures or bridgework may not be worn into surgery.  Leave suitcase in the car. After surgery it may be brought to your room.  For patients admitted to the hospital, discharge time is determined by your treatment team.               Patients discharged the day of surgery will not be allowed to drive home.  Name and phone number of your driver: family  Special Instructions: Shower using CHG 2 nights before surgery and the night before surgery.  If you shower the day of surgery use CHG.  Use special wash - you have one bottle of CHG for all showers.  You should use approximately 1/3 of the bottle for each shower.   Please read over the following fact sheets that you were given: Pain Booklet, Coughing and Deep Breathing, Blood Transfusion Information, Total Joint Packet, MRSA Information, Surgical Site Infection Prevention, Anesthesia Post-op Instructions and Care and Recovery After Surgery Total Knee Replacement Total knee replacement is a procedure to replace your knee joint with an artificial knee joint (prosthetic knee joint). The purpose of this surgery is to reduce pain and improve your knee function. LET YOUR CAREGIVER KNOW ABOUT:   Any allergies you have.  Any medicines you are taking, including vitamins,  herbs, eyedrops, over-the-counter medicines, and creams.  Any problems you have had with the use of anesthetics.  Family history of problems with the use of anesthetics.  Any blood disorders you have, including bleeding problems or clotting problems.  Previous surgeries you have had. RISKS AND COMPLICATIONS  Generally, total knee replacement is a safe procedure. However, as with any surgical procedure, complications can occur. Possible complications associated with total knee replacement include:  Loss of range of motion of the knee or instability.  Loosening of the prosthesis.  Infection.  Persistent pain. BEFORE THE PROCEDURE   Your caregiver will instruct you when you need to stop eating and drinking.  Ask your caregiver if you need to change or stop any regular medicines. PROCEDURE  Just before the procedure you will receive medicine that will make you drowsy (sedative). This will be given through a tube that is inserted into one of your veins (intravenous [IV] tube). Then you will either receive medicine to block pain from the waist down through your legs (spinal block) or medicine to also receive medicine to make you fall asleep (general anesthetic). You may also receive medicine to block feeling in your leg (nerve block) to help ease pain after surgery. An incision will be made in your knee. Your surgeon will take out any damaged cartilage and bone by sawing off the  damaged surfaces. Then the surgeon will put a new metal liner over the sawed off portion of your thigh bone (femur) and a plastic liner over the sawed off portion of one of the bones of your lower leg (tibia). This is to restore alignment and function to your knee. A plastic piece is often used to restore the surface of your knee cap. AFTER THE PROCEDURE  You will be taken to the recovery area. You may have drainage tubes to drain excess fluid from your knee. These tubes attach to a device that removes these fluids.  Once you are awake, stable, and taking fluids well, you will be taken to your hospital room. You will receive physical therapy as prescribed by your caregiver. The length of your stay in the hospital after a knee replacement is 2 4 days. Your surgeon may recommend that you spend time (usually an additional 10 14 days) in an extended-care facility to help you begin walking again and improve your range of motion before you go home. You may also be prescribed blood-thinning medicine to decrease your risk of developing blood clots in your leg. Document Released: 12/14/2000 Document Revised: 03/08/2012 Document Reviewed: 10/18/2011 [facility] Patient Information 2014 [place]. PATIENT INSTRUCTIONS POST-ANESTHESIA  IMMEDIATELY FOLLOWING SURGERY:  Do not drive or operate machinery for the first twenty four hours after surgery.  Do not make any important decisions for twenty four hours after surgery or while taking narcotic pain medications or sedatives.  If you develop intractable nausea and vomiting or a severe headache please notify your doctor immediately.  FOLLOW-UP:  Please make an appointment with your surgeon as instructed. You do not need to follow up with anesthesia unless specifically instructed to do so.  WOUND CARE INSTRUCTIONS (if applicable):  Keep a dry clean dressing on the anesthesia/puncture wound site if there is drainage.  Once the wound has quit draining you may leave it open to air.  Generally you should leave the bandage intact for twenty four hours unless there is drainage.  If the epidural site drains for more than 36-48 hours please call the anesthesia department.  QUESTIONS?:  Please feel free to call your physician or the hospital operator if you have any questions, and they will be happy to assist you.

## 2014-01-18 NOTE — Telephone Encounter (Signed)
Routing to Dr Harrison 

## 2014-01-23 NOTE — H&P (Addendum)
TOTAL KNEE ADMISSION H&P  Patient is being admitted for right total knee arthroplasty.  Subjective:  Chief Complaint:right knee pain.  HPI: Sara [name], 68 y.o. female, has a history of pain and functional disability in the right knee due to arthritis and has failed non-surgical conservative treatments for greater than 12 weeks to includeNSAID's and/or analgesics, corticosteriod injections, flexibility and strengthening excercises, supervised PT with diminished ADL's post treatment, use of assistive devices, activity modification and surgerical arthroscopy.  Onset of symptoms was gradual, starting several years ago and did not improve after arthroscopic surgery.  with gradually worsening course since that time. The patient noted prior procedures on the knee to include  arthroscopy and menisectomy on the right knee(s).  Patient currently rates pain in the right knee(s) at 8 out of 10 with activity. Patient has night pain, worsening of pain with activity and weight bearing, pain that interferes with activities of daily living, pain with passive range of motion, crepitus and joint swelling.  Patient has evidence of subchondral sclerosis, periarticular osteophytes and joint space narrowing by imaging studies. This patient has had non operative measures . There is no active infection.  Patient Active Problem List   Diagnosis Date Noted  . Abnormality of gait 10/11/2013  . S/P arthroscopy of right knee 10/09/2013  . Radicular pain of right lower extremity 07/25/2013  . Knee instability 07/25/2013  . Right knee pain 07/25/2013  . Chest pain 04/25/2013  . Patellar tendonitis 09/07/2012  . OA (osteoarthritis) of knee 02/09/2012  . GERD (gastroesophageal reflux disease) 12/01/2011  . Dysphagia 12/01/2011  . RUQ pain 12/01/2011  . Fatty liver 12/01/2011  . Hematochezia 12/01/2011  . INTERMITTENT VERTIGO 09/12/2010  . KNEE, ARTHRITIS, DEGEN./OSTEO 04/03/2009  . CHEST PAIN, RECURRENT 03/07/2009   . OTHER DYSPHAGIA 03/07/2009  . PALPITATIONS 02/06/2009  . LIVER FUNCTION TESTS, ABNORMAL, HX OF 01/01/2009  . MORBID OBESITY 12/31/2008  . PUD 12/31/2008  . BACK PAIN 12/24/2008  . ANSERINE BURSITIS 12/24/2008  . BENIGN POSITIONAL VERTIGO 07/16/2008  . H N P-LUMBAR 04/02/2008  . SPINAL STENOSIS, CERVICAL 02/15/2008  . SPINAL STENOSIS, LUMBAR 02/15/2008  . Patellar tendinitis 10/19/2007  . TEAR LATERAL MENISCUS 06/13/2007  . OBESITY NOS 10/29/2006  . ALLERGIC RHINITIS, SEASONAL 10/29/2006  . HYPOTHYROIDISM 09/07/2006  . HYPERLIPIDEMIA 09/07/2006  . HYPERTENSION 09/07/2006  . GERD 09/07/2006  . DIVERTICULOSIS, COLON 09/07/2006  . OVERACTIVE BLADDER 09/07/2006  . FIBROCYSTIC BREAST DISEASE 09/07/2006  . OSTEOARTHRITIS 09/07/2006   Past Medical History  Diagnosis Date  . Thyroid disease   . Chronic back pain   . Reflux   . Unspecified arthropathy, shoulder region   . Pure hypercholesterolemia     takes Pravastin daily  . Allergic rhinitis due to pollen   . Cervicalgia   . Diverticulosis of colon (without mention of hemorrhage)   . Morbid obesity   . Palpitations   . Peptic ulcer, unspecified site, unspecified as acute or chronic, without mention of hemorrhage, perforation, or obstruction   . Peripheral vertigo, unspecified     takes Meclizine prn  . Reflux esophagitis   . Spinal stenosis in cervical region   . 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 Personal history of tobacco use, presenting hazards to health   . Insomnia, unspecified   . Lumbago   . Right leg pain   . Shortness of breath   . H/O hiatal hernia   . HTN (  hypertension)     takes Metoprolol and Lisinopril daily  . Hypertension   . Sleep apnea     slight but doesn't require a CPAP  . History of bronchitis     > 21yr ago  . Seasonal allergies     uses Flonase daily  . Headache(784.0)     denies migraines since the 80's but has occ and  takes Butalbital prn  . Cervical spondylosis without myelopathy     all over  . Degeneration of cervical intervertebral disc   . Primary localized osteoarthrosis, lower leg   . GERD (gastroesophageal reflux disease)     takes Omeprazole daily  . Constipation   . Diverticulosis   . History of bladder infections   . Urinary incontinence     occasionally  . Other postablative hypothyroidism     takes SYnthroid daily  . Insomnia     takes Ativan nigtly    Past Surgical History  Procedure Laterality Date  . Knee surgery      arthroscopy-- R  . Tonsillectomy    . Appendectomy    . Colonoscopy  Sept 2008    RMR: normal rectum, left-sided diverticula, repeat in 2018  . Abdominal hysterectomy    . Upper gastrointestinal endoscopy    . Breast lumpectomy      left   . Eye surgery      cataracts removed- bilateral, /w IOL  . Lumpectomy on pelvis    . Diagnostic laparoscopy    . Tubal ligation    . Esophagogastroduodenoscopy    . Anterior lat lumbar fusion  05/13/2012    Procedure: ANTERIOR LATERAL LUMBAR FUSION 2 LEVELS;  Surgeon: Faythe Ghee, MD;  Location: [facility];  Service: Neurosurgery;  Laterality: Left;  Left Lumbar Three-four,Lumbar four-five Extreme Lumbar Interbody Fusion with Percutaneous Pedicle Screws  . Spinal fusion    . Knee arthroscopy with lateral menisectomy Right 10/06/2013    Procedure: KNEE ARTHROSCOPY WITH LATERAL AND MEDIAL MENISECTOMY;  Surgeon: Carole Civil, MD;  Location: [facility];  Service: Orthopedics;  Laterality: Right;  END @ 1234  . Foreign body removal Right 10/06/2013    Procedure: REMOVAL FOREIGN BODY EXTREMITY;  Surgeon: Carole Civil, MD;  Location: [facility];  Service: Orthopedics;  Laterality: Right;  . Injection knee Left 10/06/2013    Procedure: KNEE INJECTION;  Surgeon: Carole Civil, MD;  Location: [facility];  Service: Orthopedics;  Laterality: Left;    No prescriptions prior to admission   Allergies  Allergen Reactions  .  Darvon Other (See Comments)    Hallucinations   . Propoxyphene Hcl Other (See Comments)     Hallucinations  . Camphor Itching  . Dust Mite Extract Itching    Sneezing. Runny nose   . Molds & Smuts Itching  . Tomato Hives  . Latex Dermatitis and Rash    History  Substance Use Topics  . Smoking status: Former Smoker -- 1.00 packs/day for 27 years    Types: Cigarettes  . Smokeless tobacco: Former Systems developer    Quit date: 10/02/1985  . Alcohol Use: Yes     Comment: occasional wine(red)    Family History  Problem Relation Age of Onset  . Colon cancer Neg Hx      ROS Her review of systems is essentially normal for any acute processes regarding the chest respiratory GI GU tract  She does have some residual neurologic symptoms in the right leg with pain along the side of  the leg into the dorsum of the foot  Objective:  Physical Exam  BP 102/62  Ht 5\' 6"  (1.676 m)  Wt 249 lb (112.946 kg)  BMI 40.21 kg/m2 General appearance is normal, the patient is alert and oriented x3 with normal mood and affect. Upper extremity exam  The right and left upper extremity:   Inspection and palpation revealed no abnormalities in the upper extremities.   Range of motion is full without contracture.  Motor exam is normal with grade 5 strength.  The joints are fully reduced without subluxation.  There is no atrophy or tremor and muscle tone is normal.  All joints are stable.   Left leg normal today   Right leg:  Her knee is stable no effusion full range of motion she does have tenderness over the patellar tendon mild tenderness over the medial joint line the knee feels stable anteroposterior and mediolateral motor function normal skin intact chest good distal pulse she does have tenderness along the anterior compartment of the lower leg and she has  pain straight leg raise at 45 in the back of the leg  She is ambulatory without assistive device  No weakness detected in the foot  X-ray  of the knee shows mild degenerative changes  Vital signs in last 24 hours:    Labs:   Estimated body mass index is 35.04 kg/(m^2) as calculated from the following:   Height as of 01/02/14: 5\' 6"  (1.676 m).   Weight as of 01/02/14: 217 lb (98.431 kg).   Imaging Review Plain radiographs demonstrate moderate degenerative joint disease of the right knee(s). The overall alignment isaltered. The bone quality appears to be good for age and reported activity level.  Assessment/Plan:  End stage arthritis, right knee   The patient history, physical examination, clinical judgment of the provider and imaging studies are consistent with end stage degenerative joint disease of the right knee(s) and total knee arthroplasty is deemed medically necessary. The treatment options including medical management, injection therapy arthroscopy and arthroplasty were discussed at length. The risks and benefits of total knee arthroplasty were presented and reviewed. The risks due to aseptic loosening, infection, stiffness, patella tracking problems, thromboembolic complications and other imponderables were discussed. The patient acknowledged the explanation, agreed to proceed with the plan and consent was signed. Patient is being admitted for inpatient treatment for surgery, pain control, PT, OT, prophylactic antibiotics, VTE prophylaxis, progressive ambulation and ADL's and discharge planning. The patient is planning to be discharged home with home health services

## 2014-01-24 ENCOUNTER — Inpatient Hospital Stay (HOSPITAL_COMMUNITY)
Admission: RE | Admit: 2014-01-24 | Discharge: 2014-01-27 | DRG: 470 | Disposition: A | Discharge status: Home Health | Payer: PRIVATE HEALTH INSURANCE | Source: Ambulatory Visit | Attending: Orthopedic Surgery | Admitting: Orthopedic Surgery

## 2014-01-24 ENCOUNTER — Inpatient Hospital Stay (HOSPITAL_COMMUNITY): Payer: PRIVATE HEALTH INSURANCE

## 2014-01-24 ENCOUNTER — Encounter (HOSPITAL_COMMUNITY)
Admission: RE | Disposition: A | Discharge status: Home Health | Payer: Self-pay | Source: Ambulatory Visit | Attending: Orthopedic Surgery

## 2014-01-24 ENCOUNTER — Encounter (HOSPITAL_COMMUNITY): Payer: Self-pay

## 2014-01-24 ENCOUNTER — Encounter (HOSPITAL_COMMUNITY): Payer: PRIVATE HEALTH INSURANCE | Admitting: Anesthesiology

## 2014-01-24 ENCOUNTER — Inpatient Hospital Stay (HOSPITAL_COMMUNITY): Payer: PRIVATE HEALTH INSURANCE | Admitting: Anesthesiology

## 2014-01-24 DIAGNOSIS — E890 Postprocedural hypothyroidism: Secondary | ICD-10-CM | POA: Diagnosis present

## 2014-01-24 DIAGNOSIS — M545 Low back pain, unspecified: Secondary | ICD-10-CM | POA: Diagnosis present

## 2014-01-24 DIAGNOSIS — E7800 Pure hypercholesterolemia, unspecified: Secondary | ICD-10-CM | POA: Diagnosis present

## 2014-01-24 DIAGNOSIS — K7689 Other specified diseases of liver: Secondary | ICD-10-CM | POA: Diagnosis present

## 2014-01-24 DIAGNOSIS — M1710 Unilateral primary osteoarthritis, unspecified knee: Principal | ICD-10-CM | POA: Diagnosis present

## 2014-01-24 DIAGNOSIS — M19019 Primary osteoarthritis, unspecified shoulder: Secondary | ICD-10-CM | POA: Diagnosis present

## 2014-01-24 DIAGNOSIS — M179 Osteoarthritis of knee, unspecified: Secondary | ICD-10-CM | POA: Diagnosis present

## 2014-01-24 DIAGNOSIS — Z87891 Personal history of nicotine dependence: Secondary | ICD-10-CM

## 2014-01-24 DIAGNOSIS — E785 Hyperlipidemia, unspecified: Secondary | ICD-10-CM | POA: Diagnosis present

## 2014-01-24 DIAGNOSIS — G4730 Sleep apnea, unspecified: Secondary | ICD-10-CM | POA: Diagnosis present

## 2014-01-24 DIAGNOSIS — I10 Essential (primary) hypertension: Secondary | ICD-10-CM | POA: Diagnosis present

## 2014-01-24 DIAGNOSIS — K219 Gastro-esophageal reflux disease without esophagitis: Secondary | ICD-10-CM | POA: Diagnosis present

## 2014-01-24 DIAGNOSIS — M25569 Pain in unspecified knee: Secondary | ICD-10-CM | POA: Diagnosis present

## 2014-01-24 DIAGNOSIS — G8929 Other chronic pain: Secondary | ICD-10-CM | POA: Diagnosis present

## 2014-01-24 DIAGNOSIS — IMO0002 Reserved for concepts with insufficient information to code with codable children: Secondary | ICD-10-CM

## 2014-01-24 HISTORY — PX: TOTAL KNEE ARTHROPLASTY: SHX125

## 2014-01-24 LAB — GLUCOSE, CAPILLARY: Glucose-Capillary: 97 mg/dL (ref 70–99)

## 2014-01-24 SURGERY — ARTHROPLASTY, KNEE, TOTAL
Anesthesia: Monitor Anesthesia Care | Site: Knee | Laterality: Right

## 2014-01-24 MED ORDER — ONDANSETRON HCL 4 MG PO TABS: 4.0000 mg | ORAL_TABLET | Freq: Four times a day (QID) | ORAL | Status: DC | PRN

## 2014-01-24 MED ORDER — HYDROCHLOROTHIAZIDE 12.5 MG PO CAPS
12.5000 mg | ORAL_CAPSULE | Freq: Every day | ORAL | Status: DC
  Administered 2014-01-26: 12.5 mg via ORAL
  Filled 2014-01-24 (×2): qty 1

## 2014-01-24 MED ORDER — CEFAZOLIN SODIUM-DEXTROSE 2-3 GM-% IV SOLR
2.0000 g | INTRAVENOUS | Status: AC
  Administered 2014-01-24: 2 g via INTRAVENOUS

## 2014-01-24 MED ORDER — PHENOL 1.4 % MT LIQD: 1.0000 | OROMUCOSAL | Status: DC | PRN

## 2014-01-24 MED ORDER — LEVOTHYROXINE SODIUM 88 MCG PO TABS
88.0000 ug | ORAL_TABLET | Freq: Every day | ORAL | Status: DC
  Administered 2014-01-25 – 2014-01-27 (×3): 88 ug via ORAL
  Filled 2014-01-24 (×5): qty 1

## 2014-01-24 MED ORDER — OXYCODONE HCL 5 MG PO TABS
5.0000 mg | ORAL_TABLET | Freq: Once | ORAL | Status: AC
  Administered 2014-01-24: 5 mg via ORAL
  Filled 2014-01-24: qty 1

## 2014-01-24 MED ORDER — PANTOPRAZOLE SODIUM 40 MG PO TBEC
40.0000 mg | DELAYED_RELEASE_TABLET | Freq: Every day | ORAL | Status: DC
  Administered 2014-01-24 – 2014-01-27 (×4): 40 mg via ORAL
  Filled 2014-01-24 (×4): qty 1

## 2014-01-24 MED ORDER — EPHEDRINE SULFATE 50 MG/ML IJ SOLN
INTRAMUSCULAR | Status: DC | PRN
  Administered 2014-01-24: 10 mg via INTRAVENOUS
  Administered 2014-01-24: 15 mg via INTRAVENOUS
  Administered 2014-01-24: 10 mg via INTRAVENOUS

## 2014-01-24 MED ORDER — HYDROMORPHONE HCL PF 1 MG/ML IJ SOLN
0.5000 mg | INTRAMUSCULAR | Status: DC | PRN
  Administered 2014-01-24 – 2014-01-25 (×5): 0.5 mg via INTRAVENOUS
  Filled 2014-01-24 (×5): qty 1

## 2014-01-24 MED ORDER — DIPHENHYDRAMINE HCL (SLEEP) 25 MG PO TABS: 50.0000 mg | ORAL_TABLET | Freq: Every evening | ORAL | Status: DC | PRN

## 2014-01-24 MED ORDER — CEFAZOLIN SODIUM-DEXTROSE 2-3 GM-% IV SOLR
2.0000 g | Freq: Four times a day (QID) | INTRAVENOUS | Status: AC
  Administered 2014-01-24 (×2): 2 g via INTRAVENOUS
  Filled 2014-01-24 (×2): qty 50

## 2014-01-24 MED ORDER — ONDANSETRON HCL 4 MG/2ML IJ SOLN
4.0000 mg | Freq: Four times a day (QID) | INTRAMUSCULAR | Status: DC | PRN
  Administered 2014-01-24 – 2014-01-25 (×2): 4 mg via INTRAVENOUS
  Filled 2014-01-24 (×2): qty 2

## 2014-01-24 MED ORDER — BISACODYL 10 MG RE SUPP: 10.0000 mg | Freq: Every day | RECTAL | Status: DC | PRN

## 2014-01-24 MED ORDER — ASPIRIN EC 325 MG PO TBEC
325.0000 mg | DELAYED_RELEASE_TABLET | Freq: Every day | ORAL | Status: DC
  Administered 2014-01-25 – 2014-01-27 (×3): 325 mg via ORAL
  Filled 2014-01-24 (×3): qty 1

## 2014-01-24 MED ORDER — SODIUM CHLORIDE BACTERIOSTATIC 0.9 % IJ SOLN
INTRAMUSCULAR | Status: AC
  Filled 2014-01-24: qty 10

## 2014-01-24 MED ORDER — FENTANYL CITRATE 0.05 MG/ML IJ SOLN
INTRAMUSCULAR | Status: AC
  Filled 2014-01-24: qty 2

## 2014-01-24 MED ORDER — LORAZEPAM 1 MG PO TABS
1.0000 mg | ORAL_TABLET | Freq: Every evening | ORAL | Status: DC | PRN
  Administered 2014-01-24: 1 mg via ORAL
  Filled 2014-01-24: qty 1

## 2014-01-24 MED ORDER — GABAPENTIN 300 MG PO CAPS
300.0000 mg | ORAL_CAPSULE | Freq: Every day | ORAL | Status: DC
  Administered 2014-01-24 – 2014-01-26 (×3): 300 mg via ORAL
  Filled 2014-01-24 (×3): qty 1

## 2014-01-24 MED ORDER — BUPIVACAINE IN DEXTROSE 0.75-8.25 % IT SOLN
INTRATHECAL | Status: DC | PRN
  Administered 2014-01-24: 15 mg via INTRATHECAL

## 2014-01-24 MED ORDER — SODIUM CHLORIDE 0.9 % IR SOLN
Status: DC | PRN
  Administered 2014-01-24: 3000 mL

## 2014-01-24 MED ORDER — SENNA 8.6 MG PO TABS
1.0000 | ORAL_TABLET | Freq: Two times a day (BID) | ORAL | Status: DC
  Administered 2014-01-24 – 2014-01-27 (×7): 8.6 mg via ORAL
  Filled 2014-01-24 (×7): qty 1

## 2014-01-24 MED ORDER — BUPIVACAINE IN DEXTROSE 0.75-8.25 % IT SOLN
INTRATHECAL | Status: AC
  Filled 2014-01-24: qty 2

## 2014-01-24 MED ORDER — DIPHENHYDRAMINE HCL 12.5 MG/5ML PO ELIX: 12.5000 mg | ORAL_SOLUTION | ORAL | Status: DC | PRN

## 2014-01-24 MED ORDER — ACETAMINOPHEN 650 MG RE SUPP: 650.0000 mg | Freq: Four times a day (QID) | RECTAL | Status: DC | PRN

## 2014-01-24 MED ORDER — SODIUM CHLORIDE 0.9 % IV SOLN
1000.0000 mg | INTRAVENOUS | Status: AC
  Administered 2014-01-24: 1000 mg via INTRAVENOUS
  Filled 2014-01-24: qty 10

## 2014-01-24 MED ORDER — METOPROLOL TARTRATE 25 MG PO TABS
25.0000 mg | ORAL_TABLET | Freq: Two times a day (BID) | ORAL | Status: DC
  Administered 2014-01-24 – 2014-01-27 (×6): 25 mg via ORAL
  Filled 2014-01-24 (×5): qty 1

## 2014-01-24 MED ORDER — DOCUSATE SODIUM 100 MG PO CAPS
100.0000 mg | ORAL_CAPSULE | Freq: Two times a day (BID) | ORAL | Status: DC
  Administered 2014-01-24 – 2014-01-27 (×7): 100 mg via ORAL
  Filled 2014-01-24 (×9): qty 1

## 2014-01-24 MED ORDER — PROPOFOL 10 MG/ML IV BOLUS
INTRAVENOUS | Status: AC
  Filled 2014-01-24: qty 20

## 2014-01-24 MED ORDER — MIDAZOLAM HCL 2 MG/2ML IJ SOLN
INTRAMUSCULAR | Status: AC
  Filled 2014-01-24: qty 2

## 2014-01-24 MED ORDER — POLYETHYLENE GLYCOL 3350 17 G PO PACK
17.0000 g | PACK | Freq: Every day | ORAL | Status: DC
  Administered 2014-01-24 – 2014-01-26 (×3): 17 g via ORAL
  Filled 2014-01-24 (×5): qty 1

## 2014-01-24 MED ORDER — BUPIVACAINE LIPOSOME 1.3 % IJ SUSP
8.0000 mL | Freq: Once | INTRAMUSCULAR | Status: DC
  Filled 2014-01-24: qty 20

## 2014-01-24 MED ORDER — DEXTROSE 5 % IV SOLN
500.0000 mg | Freq: Four times a day (QID) | INTRAVENOUS | Status: DC | PRN
  Filled 2014-01-24: qty 5

## 2014-01-24 MED ORDER — LACTATED RINGERS IV SOLN
INTRAVENOUS | Status: DC
Start: 2014-01-24 — End: 2014-01-24
  Administered 2014-01-24 (×2): via INTRAVENOUS

## 2014-01-24 MED ORDER — FENTANYL CITRATE 0.05 MG/ML IJ SOLN
25.0000 ug | INTRAMUSCULAR | Status: DC | PRN
  Administered 2014-01-24 – 2014-01-25 (×4): 50 ug via INTRAVENOUS
  Filled 2014-01-24 (×4): qty 2

## 2014-01-24 MED ORDER — LOSARTAN POTASSIUM-HCTZ 100-12.5 MG PO TABS: 1.0000 | ORAL_TABLET | Freq: Every day | ORAL | Status: DC

## 2014-01-24 MED ORDER — FENTANYL CITRATE 0.05 MG/ML IJ SOLN
25.0000 ug | INTRAMUSCULAR | Status: AC
  Administered 2014-01-24 (×2): 25 ug via INTRAVENOUS

## 2014-01-24 MED ORDER — ACETAMINOPHEN 325 MG PO TABS: 650.0000 mg | ORAL_TABLET | Freq: Four times a day (QID) | ORAL | Status: DC | PRN

## 2014-01-24 MED ORDER — SIMVASTATIN 10 MG PO TABS
5.0000 mg | ORAL_TABLET | Freq: Every day | ORAL | Status: DC
  Administered 2014-01-24 – 2014-01-26 (×3): 5 mg via ORAL
  Filled 2014-01-24 (×4): qty 1

## 2014-01-24 MED ORDER — ONDANSETRON HCL 4 MG/2ML IJ SOLN
INTRAMUSCULAR | Status: AC
  Filled 2014-01-24: qty 2

## 2014-01-24 MED ORDER — LOSARTAN POTASSIUM 50 MG PO TABS
100.0000 mg | ORAL_TABLET | Freq: Every day | ORAL | Status: DC
  Administered 2014-01-26: 100 mg via ORAL
  Filled 2014-01-24 (×2): qty 2

## 2014-01-24 MED ORDER — ONDANSETRON HCL 4 MG/2ML IJ SOLN: 4.0000 mg | Freq: Once | INTRAMUSCULAR | Status: DC | PRN

## 2014-01-24 MED ORDER — SODIUM CHLORIDE 0.9 % IJ SOLN
INTRAMUSCULAR | Status: AC
  Filled 2014-01-24: qty 40

## 2014-01-24 MED ORDER — OXYCODONE HCL 5 MG PO TABS: 5.0000 mg | ORAL_TABLET | ORAL | Status: DC | PRN

## 2014-01-24 MED ORDER — METOCLOPRAMIDE HCL 5 MG/ML IJ SOLN: 5.0000 mg | Freq: Three times a day (TID) | INTRAMUSCULAR | Status: DC | PRN

## 2014-01-24 MED ORDER — METHOCARBAMOL 500 MG PO TABS
500.0000 mg | ORAL_TABLET | Freq: Four times a day (QID) | ORAL | Status: DC | PRN
  Administered 2014-01-25: 500 mg via ORAL
  Filled 2014-01-24: qty 1

## 2014-01-24 MED ORDER — CHLORHEXIDINE GLUCONATE 4 % EX LIQD: 60.0000 mL | Freq: Once | CUTANEOUS | Status: DC

## 2014-01-24 MED ORDER — FLUTICASONE PROPIONATE 50 MCG/ACT NA SUSP
2.0000 | Freq: Every day | NASAL | Status: DC
  Administered 2014-01-24 – 2014-01-26 (×3): 2 via NASAL
  Filled 2014-01-24: qty 16

## 2014-01-24 MED ORDER — FENTANYL CITRATE 0.05 MG/ML IJ SOLN
INTRAMUSCULAR | Status: DC | PRN
  Administered 2014-01-24: 25 ug via INTRATHECAL

## 2014-01-24 MED ORDER — EPHEDRINE SULFATE 50 MG/ML IJ SOLN
INTRAMUSCULAR | Status: AC
  Filled 2014-01-24: qty 1

## 2014-01-24 MED ORDER — BUPIVACAINE LIPOSOME 1.3 % IJ SUSP
INTRAMUSCULAR | Status: DC | PRN
  Administered 2014-01-24: 20 mL

## 2014-01-24 MED ORDER — MENTHOL 3 MG MT LOZG: 1.0000 | LOZENGE | OROMUCOSAL | Status: DC | PRN

## 2014-01-24 MED ORDER — SODIUM CHLORIDE 0.9 % IV SOLN
INTRAVENOUS | Status: DC | PRN
  Administered 2014-01-24: 40 mL via INTRAMUSCULAR

## 2014-01-24 MED ORDER — OXYCODONE-ACETAMINOPHEN 5-325 MG PO TABS
1.0000 | ORAL_TABLET | ORAL | Status: DC
Start: 2014-01-24 — End: 2014-01-26
  Administered 2014-01-24 – 2014-01-26 (×11): 1 via ORAL
  Filled 2014-01-24 (×11): qty 1

## 2014-01-24 MED ORDER — BUPIVACAINE-EPINEPHRINE (PF) 0.5% -1:200000 IJ SOLN
INTRAMUSCULAR | Status: AC
  Filled 2014-01-24: qty 30

## 2014-01-24 MED ORDER — MIDAZOLAM HCL 2 MG/2ML IJ SOLN
1.0000 mg | INTRAMUSCULAR | Status: DC | PRN
  Administered 2014-01-24: 2 mg via INTRAVENOUS

## 2014-01-24 MED ORDER — ZOLPIDEM TARTRATE 5 MG PO TABS
5.0000 mg | ORAL_TABLET | Freq: Every evening | ORAL | Status: DC | PRN
  Administered 2014-01-25: 5 mg via ORAL
  Filled 2014-01-24: qty 1

## 2014-01-24 MED ORDER — MAGNESIUM CITRATE PO SOLN: 1.0000 | Freq: Once | ORAL | Status: AC | PRN

## 2014-01-24 MED ORDER — ALUM & MAG HYDROXIDE-SIMETH 200-200-20 MG/5ML PO SUSP: 30.0000 mL | ORAL | Status: DC | PRN

## 2014-01-24 MED ORDER — BUPIVACAINE-EPINEPHRINE (PF) 0.5% -1:200000 IJ SOLN
INTRAMUSCULAR | Status: DC | PRN
  Administered 2014-01-24: 30 mL

## 2014-01-24 MED ORDER — METOCLOPRAMIDE HCL 10 MG PO TABS: 5.0000 mg | ORAL_TABLET | Freq: Three times a day (TID) | ORAL | Status: DC | PRN

## 2014-01-24 MED ORDER — ONDANSETRON HCL 4 MG/2ML IJ SOLN
4.0000 mg | Freq: Once | INTRAMUSCULAR | Status: AC
  Administered 2014-01-24: 4 mg via INTRAVENOUS
  Filled 2014-01-24: qty 2

## 2014-01-24 MED ORDER — CEFAZOLIN SODIUM-DEXTROSE 2-3 GM-% IV SOLR
INTRAVENOUS | Status: AC
  Filled 2014-01-24: qty 50

## 2014-01-24 MED ORDER — PREGABALIN 50 MG PO CAPS
50.0000 mg | ORAL_CAPSULE | Freq: Once | ORAL | Status: AC
  Administered 2014-01-24: 50 mg via ORAL
  Filled 2014-01-24: qty 1

## 2014-01-24 MED ORDER — FENTANYL CITRATE 0.05 MG/ML IJ SOLN
INTRAMUSCULAR | Status: DC | PRN
  Administered 2014-01-24: 25 ug via INTRAVENOUS

## 2014-01-24 MED ORDER — METHOCARBAMOL 1000 MG/10ML IJ SOLN
500.0000 mg | Freq: Once | INTRAVENOUS | Status: AC
  Administered 2014-01-24: 500 mg via INTRAVENOUS
  Filled 2014-01-24: qty 5

## 2014-01-24 MED ORDER — PROPOFOL INFUSION 10 MG/ML OPTIME
INTRAVENOUS | Status: DC | PRN
  Administered 2014-01-24: 35 ug/kg/min via INTRAVENOUS

## 2014-01-24 MED ORDER — MIDAZOLAM HCL 5 MG/5ML IJ SOLN
INTRAMUSCULAR | Status: DC | PRN
  Administered 2014-01-24 (×2): 1 mg via INTRAVENOUS

## 2014-01-24 MED ORDER — SODIUM CHLORIDE 0.9 % IR SOLN
Status: DC | PRN
  Administered 2014-01-24: 1000 mL

## 2014-01-24 SURGICAL SUPPLY — 70 items
BAG HAMPER (MISCELLANEOUS) ×2 IMPLANT
BANDAGE ESMARK 6X9 LF (GAUZE/BANDAGES/DRESSINGS) ×1 IMPLANT
BIT DRILL 3.2X128 (BIT) ×1 IMPLANT
BLADE HEX COATED 2.75 (ELECTRODE) ×2 IMPLANT
BLADE SAG 18X100X1.27 (BLADE) IMPLANT
BLADE SAGITTAL 25.0X1.27X90 (BLADE) ×2 IMPLANT
BLADE SAW SAG 90X13X1.27 (BLADE) ×2 IMPLANT
BNDG CMPR 9X6 STRL LF SNTH (GAUZE/BANDAGES/DRESSINGS) ×1
BNDG ESMARK 6X9 LF (GAUZE/BANDAGES/DRESSINGS) ×2
BOWL SMART MIX CTS (DISPOSABLE) IMPLANT
CAPT FB KNEE W/COCR XLINK ×1 IMPLANT
CEMENT HV SMART SET (Cement) ×4 IMPLANT
CLOTH BEACON ORANGE TIMEOUT ST (SAFETY) ×2 IMPLANT
COVER LIGHT HANDLE STERIS (MISCELLANEOUS) ×8 IMPLANT
COVER PROBE W GEL 5X96 (DRAPES) ×2 IMPLANT
CUFF TOURNIQUET SINGLE 34IN LL (TOURNIQUET CUFF) IMPLANT
CUFF TOURNIQUET SINGLE 44IN (TOURNIQUET CUFF) ×1 IMPLANT
DECANTER SPIKE VIAL GLASS SM (MISCELLANEOUS) ×4 IMPLANT
DRAPE BACK TABLE (DRAPES) ×2 IMPLANT
DRAPE EXTREMITY T 121X128X90 (DRAPE) ×2 IMPLANT
DRSG MEPILEX BORDER 4X12 (GAUZE/BANDAGES/DRESSINGS) ×2 IMPLANT
DURAPREP 26ML APPLICATOR (WOUND CARE) ×4 IMPLANT
ELECT REM PT RETURN 9FT ADLT (ELECTROSURGICAL) ×2
ELECTRODE REM PT RTRN 9FT ADLT (ELECTROSURGICAL) ×1 IMPLANT
EVACUATOR 3/16  PVC DRAIN (DRAIN) ×1
EVACUATOR 3/16 PVC DRAIN (DRAIN) ×1 IMPLANT
FACESHIELD LNG OPTICON STERILE (SAFETY) IMPLANT
GLOVE BIOGEL PI IND STRL 7.0 (GLOVE) IMPLANT
GLOVE BIOGEL PI INDICATOR 7.0 (GLOVE) ×4
GLOVE OPTIFIT SS 8.0 STRL (GLOVE) ×2 IMPLANT
GLOVE SKINSENSE NS SZ6.5 (GLOVE) ×2
GLOVE SKINSENSE NS SZ8.0 LF (GLOVE) ×4
GLOVE SKINSENSE STRL SZ6.5 (GLOVE) IMPLANT
GLOVE SKINSENSE STRL SZ8.0 LF (GLOVE) ×2 IMPLANT
GLOVE SS N UNI LF 8.5 STRL (GLOVE) ×2 IMPLANT
GOWN STRL REUS W/TWL LRG LVL3 (GOWN DISPOSABLE) ×8 IMPLANT
GOWN STRL REUS W/TWL XL LVL3 (GOWN DISPOSABLE) ×2 IMPLANT
HANDPIECE INTERPULSE COAX TIP (DISPOSABLE) ×2
HOOD W/PEELAWAY (MISCELLANEOUS) ×8 IMPLANT
INST SET MAJOR BONE (KITS) ×2 IMPLANT
IV NS IRRIG 3000ML ARTHROMATIC (IV SOLUTION) ×2 IMPLANT
KIT BLADEGUARD II DBL (SET/KITS/TRAYS/PACK) ×2 IMPLANT
KIT ROOM TURNOVER APOR (KITS) ×2 IMPLANT
MANIFOLD NEPTUNE II (INSTRUMENTS) ×2 IMPLANT
MARKER SKIN DUAL TIP RULER LAB (MISCELLANEOUS) ×2 IMPLANT
NDL HYPO 21X1.5 SAFETY (NEEDLE) ×1 IMPLANT
NDL HYPO 25X1 1.5 SAFETY (NEEDLE) ×1 IMPLANT
NEEDLE HYPO 21X1.5 SAFETY (NEEDLE) ×2 IMPLANT
NEEDLE HYPO 25X1 1.5 SAFETY (NEEDLE) ×2 IMPLANT
NS IRRIG 1000ML POUR BTL (IV SOLUTION) ×2 IMPLANT
PACK TOTAL JOINT (CUSTOM PROCEDURE TRAY) ×2 IMPLANT
PAD ARMBOARD 7.5X6 YLW CONV (MISCELLANEOUS) ×2 IMPLANT
PAD DANNIFLEX CPM (ORTHOPEDIC SUPPLIES) ×2 IMPLANT
PIN TROCAR 3 INCH (PIN) ×2 IMPLANT
SET BASIN LINEN APH (SET/KITS/TRAYS/PACK) ×2 IMPLANT
SET HNDPC FAN SPRY TIP SCT (DISPOSABLE) ×1 IMPLANT
SPONGE DRAIN TRACH 4X4 STRL 2S (GAUZE/BANDAGES/DRESSINGS) ×2 IMPLANT
STAPLER VISISTAT 35W (STAPLE) ×2 IMPLANT
SUT BRALON NAB BRD #1 30IN (SUTURE) ×2 IMPLANT
SUT MON AB 0 CT1 (SUTURE) ×2 IMPLANT
SUT MON AB 2-0 CT1 36 (SUTURE) IMPLANT
SYR 20CC LL (SYRINGE) IMPLANT
SYR 30ML LL (SYRINGE) ×2 IMPLANT
SYR BULB IRRIGATION 50ML (SYRINGE) ×2 IMPLANT
TOWEL OR 17X26 4PK STRL BLUE (TOWEL DISPOSABLE) ×2 IMPLANT
TOWER CARTRIDGE SMART MIX (DISPOSABLE) ×2 IMPLANT
TRAY FOLEY BAG SILVER LF 16FR (CATHETERS) ×1 IMPLANT
TRAY FOLEY CATH 16FR SILVER (SET/KITS/TRAYS/PACK) ×2 IMPLANT
WATER STERILE IRR 1000ML POUR (IV SOLUTION) ×8 IMPLANT
YANKAUER SUCT 12FT TUBE ARGYLE (SUCTIONS) ×2 IMPLANT

## 2014-01-24 NOTE — Op Note (Signed)
01/24/2014  12:31 PM  PATIENT:  Sara [name]  68 y.o. female  PRE-OPERATIVE DIAGNOSIS:  Osteoarthritis right knee  POST-OPERATIVE DIAGNOSIS:  Osteoarthritis right knee  PROCEDURE:  Procedure(s): TOTAL KNEE ARTHROPLASTY (Right)  SURGEON:  Surgeon(s) and Role:    * Carole Civil, MD - Primary  PHYSICIAN ASSISTANT:   ASSISTANTS: wayne mcfatter and Corrie Dandy   ANESTHESIA:   spinal  EBL:  Total I/O In: 1400 [I.V.:1400] Out: 400 [Urine:400]  BLOOD ADMINISTERED:none  DRAINS Hemovac in the joint  LOCAL MEDICATIONS USED:  OTHER EXPAREL 60 CC DILUTED  AND MARCAINE WITH EPI 30   SPECIMEN:  No Specimen  DISPOSITION OF SPECIMEN:  N/A  COUNTS:  YES  TOURNIQUET:   Total Tourniquet Time Documented: Thigh (Right) - 75 minutes Total: Thigh (Right) - 75 minutes   DICTATION: .Viviann Spare Dictation  PLAN OF CARE: Admit to inpatient   PATIENT DISPOSITION:  PACU - hemodynamically stable.   Delay start of Pharmacological VTE agent (>24hrs) due to surgical blood loss or risk of bleeding: yes   Indications for procedure disabling knee pain, failure to control pain with nonoperative measures  Details of procedure:  In the preop area the patient's RIGHT  knee was marked and countersigned by the surgeon, the chart was updated, consent was signed  The patient was taken to the operating room for spinal anesthetic followed by administering DOSE APPROPRIATE ANTIBIOTICS. A Foley catheter was inserted sterilely, then the operative extremity (RIGHT) was prepped and draped sterilely  The timeout was completed  The limb was then exsanguinated with a six-inch Esmarch with the knee in flexion and the tourniquet was elevated to 300 mm of mercury. A midline incision was made, the subcutaneous tissues were divided down to the extensor mechanism. A medial arthrotomy was performed, the patella was everted,  the fat pad was resected. The medial and  lateral menisci were resected. The medial  soft tissue sleeve was elevated to the mid coronal plane. The anterior cruciate ligament and PCL were resected. Osteophytes were removed. The distal femur anterior surface was skeletonized with sharp dissection.  A three-eighths inch drill bit was used to enter the femoral canal which was suctioned and irrigated until the fluid was clear;  an intramedullary rod was placed in the femur with a 5 RIGHT 11MM  setting, the block was pinned in place; then a 11 mm distal femoral resection was performed. The cut was checked for flatness.  The sizing guide was then placed on the femur, the femur measured a size BETWEEN A 2.0 AND 2.5; the block was pinned in external rotation 3 using the epicondyles as reference; a  4-in-1 cutting block was placed and the 4 cuts were made with retractors protecting the collateral ligaments. The posterior osteophytes were removed with a curved osteotome. Residual PCL tissue was resected; residual meniscal tissue was resected.  The external tibial alignment instrument was set for tibial resection. The guide was   placed referencing the medial side which was the worn side and the stylus was set at 2 mm resection. Anterior slope was built in to match the patients anatomy; a neutral varus valgus cut was set using the medial third of the tibial tubercle as reference along with the medial portion of the lateral tibial spine. The guide was stabilized with pins.  A saw was used to resect the anterior tibia. The tibia was sized to a size 2 base plate.  We then balanced the knee using spacer blocks and laminar spreaders.  The knee was stable and balanced in flexion and extension  with a 10 spacer.   The size 2.0 box cutting guide was placed to resect the bone for the post. We then turned our attention to the patella.  The patella measured 21 mm in thickness; we set the guide to leave 14 mm of patella. I CUT THE PATELLA FREEHAND.  After resection of the patella,  It was remeasured, and  measured 15 mm. I sized the patella to a  32. We then drilled the 3 peg holes.   We then did a trial reduction. The trial reduction confirmed that the knee  balanced in extension and balance in flexion. Passive flexion equalled 115, and patella tracking was normal.  The tibial rotation was set avoiding internal rotation, allowing congruency with the femur.  We then punched the tibia per technique with a 2.5 base plate.   The bone was then irrigated and dried while cement was mixed on the back table. The implants were checked for accuracy and then cemented in place; excess cement was removed; the cement was allowed to cure. The wound was then irrigated with copious amounts of saline, the posterior capsule was injected with 30 cc of EXPAREL  with epinephrine followed by 30 cc in the soft tissue. The 10 insert was placed. The range of motion and stability matched trial reduction  The capsule was closed with #1 Bralon in interrupted and running fashion and then the joint was injected with 30 cc of Marcaine with epinephrine.  The subcutaneous tissues were closed with  0 Monocryl  in running fashion  Staples were used to reapproximate the skin edges.  Sterile dressings were applied.   The patient was then taken to the recovery room in stable condition.  Routine postop plan for knee replacement.

## 2014-01-24 NOTE — Anesthesia Preprocedure Evaluation (Addendum)
Anesthesia Evaluation  Patient identified by MRN, date of birth, ID band Patient awake    Reviewed: Allergy & Precautions, H&P , NPO status , Patient's Chart, lab work & pertinent test results, reviewed documented beta blocker date and time   History of Anesthesia Complications Negative for: history of anesthetic complications  Airway Mallampati: II  TM Distance: >3 FB Neck ROM: Full    Dental  (+) Teeth Intact, Dental Advisory Given   Pulmonary neg shortness of breath, sleep apnea , former smoker,  breath sounds clear to auscultation  Pulmonary exam normal       Cardiovascular hypertension, Pt. on home beta blockers and Pt. on medications Rhythm:Regular Rate:Normal     Neuro/Psych  Headaches,  Neuromuscular disease    GI/Hepatic Neg liver ROS, hiatal hernia, PUD, GERD-  Medicated,  Endo/Other  Hypothyroidism Morbid obesity  Renal/GU negative Renal ROS     Musculoskeletal   Abdominal (+) + obese,   Peds  Hematology   Anesthesia Other Findings   Reproductive/Obstetrics                             Anesthesia Physical Anesthesia Plan  ASA: III  Anesthesia Plan: Spinal and MAC   Post-op Pain Management:    Induction: Intravenous  Airway Management Planned: Nasal Cannula  Additional Equipment:   Intra-op Plan:   Post-operative Plan:   Informed Consent: I have reviewed the patients History and Physical, chart, labs and discussed the procedure including the risks, benefits and alternatives for the proposed anesthesia with the patient or authorized representative who has indicated his/her understanding and acceptance.     Plan Discussed with:   Anesthesia Plan Comments: (Possible GOT if failed SAB discussed with pt and she agrees with plan.)        Anesthesia Quick Evaluation  

## 2014-01-24 NOTE — Anesthesia Procedure Notes (Signed)
Spinal  Patient location during procedure: OR Start time: 01/24/2014 10:44 AM Staffing Anesthesiologist: Lerry Liner Performed by: anesthesiologist  Preanesthetic Checklist Completed: patient identified, site marked, surgical consent, pre-op evaluation, timeout performed, IV checked, risks and benefits discussed and monitors and equipment checked Spinal Block Patient position: right lateral decubitus Prep: Betadine Patient monitoring: heart rate, cardiac monitor, continuous pulse ox and blood pressure Approach: right paramedian Location: L3-4 Injection technique: single-shot Needle Needle type: Spinocan  Needle gauge: 22 G Assessment Sensory level: T6 Additional Notes CSF slow and clear:  [id number]  05/2015

## 2014-01-24 NOTE — Transfer of Care (Signed)
Immediate Anesthesia Transfer of Care Note  Patient: Sara [name]  Procedure(s) Performed: Procedure(s): TOTAL KNEE ARTHROPLASTY (Right)  Patient Location: PACU  Anesthesia Type:Spinal  Level of Consciousness: awake, alert  and patient cooperative  Airway & Oxygen Therapy: Patient Spontanous Breathing and Patient connected to face mask oxygen  Post-op Assessment: Report given to PACU RN and Post -op Vital signs reviewed and stable  Post vital signs: Reviewed and stable  Complications: No apparent anesthesia complications

## 2014-01-24 NOTE — Anesthesia Postprocedure Evaluation (Signed)
  Anesthesia Post-op Note  Patient: Sara [name]  Procedure(s) Performed: Procedure(s): TOTAL KNEE ARTHROPLASTY (Right)  Patient Location: PACU  Anesthesia Type:Spinal  Level of Consciousness: awake, alert , oriented and patient cooperative  Airway and Oxygen Therapy: Patient Spontanous Breathing  Post-op Pain: 5 /10, moderate  Post-op Assessment: Post-op Vital signs reviewed, Patient's Cardiovascular Status Stable, Respiratory Function Stable, Patent Airway and Adequate PO intake  Post-op Vital Signs: Reviewed and stable  Last Vitals:  Filed Vitals:   01/24/14 1235  BP: 110/60  Pulse: 70  Temp:   Resp: 14    Complications: No apparent anesthesia complications

## 2014-01-24 NOTE — Brief Op Note (Addendum)
01/24/2014  12:31 PM  PATIENT:  Sara [name]  68 y.o. female  PRE-OPERATIVE DIAGNOSIS:  Osteoarthritis right knee  POST-OPERATIVE DIAGNOSIS:  Osteoarthritis right knee  PROCEDURE:  Procedure(s): TOTAL KNEE ARTHROPLASTY (Right)  SURGEON:  Surgeon(s) and Role:    * Carole Civil, MD - Primary  PHYSICIAN ASSISTANT:   ASSISTANTS: wayne mcfatter and Corrie Dandy   ANESTHESIA:   spinal  EBL:  Total I/O In: 1400 [I.V.:1400] Out: 400 [Urine:400]  BLOOD ADMINISTERED:none  DRAINS Hemovac in the joint  LOCAL MEDICATIONS USED:  OTHER EXPAREL 60 CC DILUTED  AND MARCAINE WITH EPI 30   SPECIMEN:  No Specimen  DISPOSITION OF SPECIMEN:  N/A  COUNTS:  YES  TOURNIQUET:   Total Tourniquet Time Documented: Thigh (Right) - 75 minutes Total: Thigh (Right) - 75 minutes   DICTATION: .Viviann Spare Dictation  PLAN OF CARE: Admit to inpatient   PATIENT DISPOSITION:  PACU - hemodynamically stable.   Delay start of Pharmacological VTE agent (>24hrs) due to surgical blood loss or risk of bleeding: yes   Indications for procedure disabling knee pain, failure to control pain with nonoperative measures  Details of procedure:  In the preop area the patient's RIGHT  knee was marked and countersigned by the surgeon, the chart was updated, consent was signed  The patient was taken to the operating room for spinal anesthetic followed by administering DOSE APPROPRIATE ANTIBIOTICS. A Foley catheter was inserted sterilely, then the operative extremity (RIGHT) was prepped and draped sterilely  The timeout was completed  The limb was then exsanguinated with a six-inch Esmarch with the knee in flexion and the tourniquet was elevated to 300 mm of mercury. A midline incision was made, the subcutaneous tissues were divided down to the extensor mechanism. A medial arthrotomy was performed, the patella was everted,  the fat pad was resected. The medial and  lateral menisci were resected. The medial  soft tissue sleeve was elevated to the mid coronal plane. The anterior cruciate ligament and PCL were resected. Osteophytes were removed. The distal femur anterior surface was skeletonized with sharp dissection.  A three-eighths inch drill bit was used to enter the femoral canal which was suctioned and irrigated until the fluid was clear;  an intramedullary rod was placed in the femur with a 5 RIGHT 11MM  setting, the block was pinned in place; then a 11 mm distal femoral resection was performed. The cut was checked for flatness.  The sizing guide was then placed on the femur, the femur measured a size BETWEEN A 2.0 AND 2.5; the block was pinned in external rotation 3 using the epicondyles as reference; a  4-in-1 cutting block was placed and the 4 cuts were made with retractors protecting the collateral ligaments. The posterior osteophytes were removed with a curved osteotome. Residual PCL tissue was resected; residual meniscal tissue was resected.  The external tibial alignment instrument was set for tibial resection. The guide was   placed referencing the medial side which was the worn side and the stylus was set at 2 mm resection. Anterior slope was built in to match the patients anatomy; a neutral varus valgus cut was set using the medial third of the tibial tubercle as reference along with the medial portion of the lateral tibial spine. The guide was stabilized with pins.  A saw was used to resect the anterior tibia. The tibia was sized to a size 2 base plate.  We then balanced the knee using spacer blocks and laminar spreaders.  The knee was stable and balanced in flexion and extension  with a 10 spacer.   The size 2.0 box cutting guide was placed to resect the bone for the post. We then turned our attention to the patella.  The patella measured 21 mm in thickness; we set the guide to leave 14 mm of patella. I CUT THE PATELLA FREEHAND.  After resection of the patella,  It was remeasured, and  measured 15 mm. I sized the patella to a  32. We then drilled the 3 peg holes.   We then did a trial reduction. The trial reduction confirmed that the knee  balanced in extension and balance in flexion. Passive flexion equalled 115, and patella tracking was normal.  The tibial rotation was set avoiding internal rotation, allowing congruency with the femur.  We then punched the tibia per technique with a 2.5 base plate.   The bone was then irrigated and dried while cement was mixed on the back table. The implants were checked for accuracy and then cemented in place; excess cement was removed; the cement was allowed to cure. The wound was then irrigated with copious amounts of saline, the posterior capsule was injected with 30 cc of EXPAREL  with epinephrine followed by 30 cc in the soft tissue. The 10 insert was placed. The range of motion and stability matched trial reduction  The capsule was closed with #1 Bralon in interrupted and running fashion and then the joint was injected with 30 cc of Marcaine with epinephrine.  The subcutaneous tissues were closed with  0 Monocryl  in running fashion  Staples were used to reapproximate the skin edges.  Sterile dressings were applied.   The patient was then taken to the recovery room in stable condition.  Routine postop plan for knee replacement.

## 2014-01-24 NOTE — Interval H&P Note (Signed)
History and Physical Interval Note:  01/24/2014 9:40 AM  Sara [name]  has presented today for surgery, with the diagnosis of Osteoarthritis right knee  The various methods of treatment have been discussed with the patient and family. After consideration of risks, benefits and other options for treatment, the patient has consented to  Procedure(s): TOTAL KNEE ARTHROPLASTY (Right) as a surgical intervention .  The patient's history has been reviewed, patient examined, no change in status, stable for surgery.  I have reviewed the patient's chart and labs.  Questions were answered to the patient's satisfaction.     Carole Civil

## 2014-01-25 ENCOUNTER — Encounter (HOSPITAL_COMMUNITY): Payer: Self-pay | Admitting: Orthopedic Surgery

## 2014-01-25 LAB — BASIC METABOLIC PANEL
BUN: 8 mg/dL (ref 6–23)
CO2: 27 mEq/L (ref 19–32)
Calcium: 8.8 mg/dL (ref 8.4–10.5)
Chloride: 98 mEq/L (ref 96–112)
Creatinine, Ser: 0.8 mg/dL (ref 0.50–1.10)
GFR calc Af Amer: 87 mL/min — ABNORMAL LOW (ref >90)
GFR calc non Af Amer: 75 mL/min — ABNORMAL LOW (ref >90)
Glucose, Bld: 97 mg/dL (ref 70–99)
Potassium: 4.1 mEq/L (ref 3.7–5.3)
Sodium: 136 mEq/L — ABNORMAL LOW (ref 137–147)

## 2014-01-25 LAB — CBC
HCT: 40.5 % (ref 36.0–46.0)
Hemoglobin: 13.7 g/dL (ref 12.0–15.0)
MCH: 32 pg (ref 26.0–34.0)
MCHC: 33.8 g/dL (ref 30.0–36.0)
MCV: 94.6 fL (ref 78.0–100.0)
Platelets: ADEQUATE 10*3/uL (ref 150–400)
RBC: 4.28 MIL/uL (ref 3.87–5.11)
RDW: 13.5 % (ref 11.5–15.5)
WBC: 11.5 10*3/uL — ABNORMAL HIGH (ref 4.0–10.5)

## 2014-01-25 MED ORDER — SODIUM CHLORIDE 0.9 % IV SOLN
INTRAVENOUS | Status: DC
  Administered 2014-01-25 – 2014-01-26 (×2): via INTRAVENOUS

## 2014-01-25 MED ORDER — HYDROMORPHONE HCL PF 1 MG/ML IJ SOLN
1.0000 mg | INTRAMUSCULAR | Status: DC | PRN
  Administered 2014-01-25 – 2014-01-27 (×9): 1 mg via INTRAVENOUS
  Filled 2014-01-25 (×10): qty 1

## 2014-01-25 NOTE — Progress Notes (Signed)
Physical Therapy Treatment Patient Details Name: Sara [name] MRN: [id number] DOB: 03-13-1946 Today's Date: 02/01/14    History of Present Illness  Pt had a TKR on her Rt knee on 01/24/2014    PT Comments    Pt is progressing very well with active flexion to 50 degree; CPM at 60.  Pt bearing  Approximately 30% of her weight through her LE without increased pain.  Pt needs contact assist only for bed mobility and supervision for gt.   Follow Up Recommendations  Home health PT     Equipment Recommendations  None recommended by PT       Precautions / Restrictions Precautions Precautions: Knee Restrictions Weight Bearing Restrictions: Yes RLE Weight Bearing: Weight bearing as tolerated    Mobility  Bed Mobility   Bed Mobility: Sidelying to Sit   Sidelying to sit: Min assist Supine to sit: Min assist        Transfers Overall transfer level: Modified independent Equipment used: Rolling walker (2 wheeled) Transfers: Sit to/from Stand Sit to Stand: Supervision            Ambulation/Gait Ambulation/Gait assistance: Min guard Ambulation Distance (Feet): 15 Feet Assistive device: Rolling walker (2 wheeled) Gait Pattern/deviations: Step-to pattern   Gait velocity interpretation: Below normal speed for age/gender            Cognition Arousal/Alertness: Awake/alert Behavior During Therapy: WFL for tasks assessed/performed Overall Cognitive Status: Within Functional Limits for tasks assessed                      Exercises Total Joint Exercises Ankle Circles/Pumps: 10 reps;Supine Quad Sets: 10 reps;Supine Gluteal Sets: 10 reps;Supine Heel Slides: 10 reps;Supine Hip ABduction/ADduction: 10 reps;Supine Goniometric ROM: 0-60        Pertinent Vitals/Pain 5/10-ice applied    Home Living    lives with husband.  No steps                      PT Goals (current goals can now be found in the care plan section) Progress towards PT goals:  Progressing toward goals    Frequency  7X/week    PT Plan Current plan remains appropriate       End of Session   Activity Tolerance: Patient tolerated treatment well Patient left: in bed;with call bell/phone within reach;in CPM     Time: 1300-1335 PT Time Calculation (min): 35 min  Charges:  $Gait Training: 8-22 mins $Therapeutic Exercise: 8-22 mins                    G Codes:      Sara [name] Feb 01, 2014, 4:30 PM

## 2014-01-25 NOTE — Addendum Note (Signed)
Addendum created 01/25/14 1008 by Vista Deck, CRNA   Modules edited: Notes Section   Notes Section:  File: [id number]

## 2014-01-25 NOTE — Progress Notes (Signed)
Physical Therapy Treatment Patient Details Name: Sara [name] MRN: [id number] DOB: 1945-12-15 Today's Date: 01/25/2014    History of Present Illness      PT Comments    Bed exercises complete for LE strengthening and to improve ROM with AA.   Pt able to complete all exercises correctly with min cueing required.  AAROM 53 degrees flexion today.  CPM set for 8 hours at 0-60 degrees.  Nursing informed of time for CPM as well as written on dry erase board.      Follow Up Recommendations  Home health PT     Equipment Recommendations  None recommended by PT    Recommendations for Other Services       Precautions / Restrictions Precautions Precautions: Knee Restrictions Weight Bearing Restrictions: Yes RLE Weight Bearing: Weight bearing as tolerated    Mobility  Bed Mobility  Transfers    Ambulation/Gait  Stairs            Wheelchair Mobility    Modified Rankin (Stroke Patients Only)       Balance                                    Cognition Arousal/Alertness: Awake/alert Behavior During Therapy: WFL for tasks assessed/performed Overall Cognitive Status: Within Functional Limits for tasks assessed                      Exercises Total Joint Exercises Ankle Circles/Pumps: 10 reps;Supine Quad Sets: 10 reps;Supine Gluteal Sets: 10 reps;Supine Heel Slides: 10 reps;Supine Hip ABduction/ADduction: 10 reps;Supine Goniometric ROM: 10-53    General Comments        Pertinent Vitals/Pain Pain scale 8/10, RN informed and pain meds given.    Home Living                      Prior Function            PT Goals (current goals can now be found in the care plan section) Progress towards PT goals: Progressing toward goals    Frequency  7X/week    PT Plan Current plan remains appropriate    Co-evaluation             End of Session   Activity Tolerance: Patient tolerated treatment well Patient left: in  bed;with call bell/phone within reach;in CPM     Time: 1300-1335 PT Time Calculation (min): 35 min  Charges:  $Gait Training: 8-22 mins $Therapeutic Exercise: 8-22 mins                    G Codes:      Aldona Lento 01/25/2014, 4:59 PM

## 2014-01-25 NOTE — Anesthesia Postprocedure Evaluation (Signed)
  Anesthesia Post-op Note  Patient: Sara [name]  Procedure(s) Performed: Procedure(s): TOTAL KNEE ARTHROPLASTY (Right)  Patient Location: [facility] Unit  Anesthesia Type:Spinal  Level of Consciousness: awake and patient cooperative  Airway and Oxygen Therapy: Patient Spontanous Breathing  Post-op Pain: mild  Post-op Assessment: Post-op Vital signs reviewed, Patient's Cardiovascular Status Stable, Respiratory Function Stable, Patent Airway and No signs of Nausea or vomiting  Post-op Vital Signs: Reviewed  Last Vitals:  Filed Vitals:   01/25/14 0948  BP: 95/59  Pulse: 77  Temp:   Resp:     Complications: No apparent anesthesia complications

## 2014-01-25 NOTE — Clinical Social Work Note (Signed)
Referred to CSW today for ?SNF. Chart reviewed and noted PT eval recommending home health.  RNCM aware and to assist with [facility] and DME needs. CSW to sign off- please contact us if SW needs arise. Eduard Clos, MSW, [place]

## 2014-01-25 NOTE — Progress Notes (Signed)
Subjective: The patient reports pain as aching  Current medications hydromorphone at IV and Percocet every 4 1 Day Post-Op Procedure(s) (LRB): TOTAL KNEE ARTHROPLASTY (Right)  BP 104/55  Pulse 87  Temp(Src) 98.2 F (36.8 C) (Oral)  Resp 18  SpO2 97% Hemoglobin & Hematocrit     Component Value Date/Time   HGB 13.7 01/25/2014 0513   HCT 40.5 01/25/2014 0513      Recent Labs  01/25/14 0513  NA 136*  K 4.1  CL 98  CO2 27  BUN 8  CREATININE 0.80  GLUCOSE 97  CALCIUM 8.8   She looks comfortable, awake and alert. Neurovascular is intact.  Assessment/Plan: 1 Day Post-Op Procedure(s) (LRB): TOTAL KNEE ARTHROPLASTY (Right) Advance diet Up with therapy Add toradol  Decrease iv fluids  Carole Civil 01/25/2014, 7:17 AM

## 2014-01-25 NOTE — Progress Notes (Signed)
UR chart review completed.  

## 2014-01-25 NOTE — Evaluation (Signed)
Physical Therapy Evaluation Patient Details Name: JOBINA MAITA MRN: [id number] DOB: [birth year] Today's Date: 01/25/2014   History of Present Illness  Pt is s/p R TKR  Clinical Impression  Pt presents with dependencies in mobility secondary to R TKR. Pt would benefit from skilled PT to maximize mobility and improve independence for return home with spouse and HHPT follow-up.    Follow Up Recommendations Home health PT    Equipment Recommendations  None recommended by PT    Recommendations for Other Services       Precautions / Restrictions Precautions Precautions: Knee Restrictions Weight Bearing Restrictions: Yes RLE Weight Bearing: Weight bearing as tolerated      Mobility  Bed Mobility Overal bed mobility: Needs Assistance Bed Mobility: Supine to Sit           General bed mobility comments: assistance with lifting R LE over in the bed, cues for technique  Transfers Overall transfer level: Needs assistance Equipment used: Rolling walker (2 wheeled) Transfers: Sit to/from Stand Sit to Stand: Min assist         General transfer comment: Cues for hand placement and forward weight shift  Ambulation/Gait Ambulation/Gait assistance: Min guard Ambulation Distance (Feet): 8 Feet Assistive device: Rolling walker (2 wheeled) Gait Pattern/deviations: Step-to pattern;Decreased stride length;Decreased weight shift to right;Decreased step length - left   Gait velocity interpretation: Below normal speed for age/gender    Stairs            Wheelchair Mobility    Modified Rankin (Stroke Patients Only)       Balance Overall balance assessment: Needs assistance   Sitting balance-Leahy Scale: Normal     Standing balance support: Bilateral upper extremity supported Standing balance-Leahy Scale: Good                               Pertinent Vitals/Pain     Home Living Family/patient expects to be discharged to:: Private  residence Living Arrangements: Spouse/significant other Available Help at Discharge: Family Type of Home: House Home Access: Stairs to enter Entrance Stairs-Rails: None Entrance Stairs-Number of Steps: 2 Home Layout: One level Home Equipment: Environmental consultant - 2 wheels;Bedside commode      Prior Function Level of Independence: Independent               Hand Dominance        Extremity/Trunk Assessment   Upper Extremity Assessment: Defer to OT evaluation           Lower Extremity Assessment: RLE deficits/detail RLE Deficits / Details: Ankle WFL, knee 2/5 knee flexion 60*    Cervical / Trunk Assessment: Normal  Communication   Communication: No difficulties  Cognition Arousal/Alertness: Awake/alert Behavior During Therapy: WFL for tasks assessed/performed Overall Cognitive Status: Within Functional Limits for tasks assessed                      General Comments      Exercises Total Joint Exercises Ankle Circles/Pumps: AROM;Strengthening;Both;15 reps;Supine Quad Sets: AROM;Strengthening;Right;5 reps;Supine Knee Flexion: AAROM;Strengthening;Right;5 reps Goniometric ROM: 0-60*      Assessment/Plan    PT Assessment Patient needs continued PT services  PT Diagnosis Difficulty walking   PT Problem List Decreased strength;Decreased range of motion;Decreased activity tolerance;Decreased mobility;Decreased knowledge of use of DME  PT Treatment Interventions DME instruction;Gait training;Stair training;Therapeutic activities;Therapeutic exercise;Patient/family education   PT Goals (Current goals can be found in the Care Plan section) Acute Rehab  PT Goals Patient Stated Goal: To walk PT Goal Formulation: With patient Time For Goal Achievement: 02/01/14 Potential to Achieve Goals: Good    Frequency 7X/week   Barriers to discharge        Co-evaluation               End of Session Equipment Utilized During Treatment: Gait belt Activity Tolerance:  Patient tolerated treatment well Patient left: in chair;with family/visitor present;with call bell/phone within reach Nurse Communication: Mobility status         Time: 7340-3709 PT Time Calculation (min): 29 min   Charges:   PT Evaluation $Initial PT Evaluation Tier I: 1 Procedure PT Treatments $Gait Training: 8-22 mins $Therapeutic Exercise: 8-22 mins   PT G Codes:          Lelon Mast 01/25/2014, 8:51 AM

## 2014-01-25 NOTE — Care Management Note (Addendum)
    Page 1 of 2   01/26/2014     4:32:54 PM CARE MANAGEMENT NOTE 01/26/2014  Patient:  Sara [name], Sara [name]   Account Number:  [id number]  Date Initiated:  01/25/2014  Documentation initiated by:  Theophilus Kinds  Subjective/Objective Assessment:   Pt admitted from home s/p total right knee. Pt lives with husband and will return home at discharge. Pt has a cane, walker, and BSC for home use.     Action/Plan:   CM will arrange [place] PT at discharge with agency of choice. WIll also arrange CPM if needed.   Anticipated DC Date:  01/27/2014   Anticipated DC Plan:  [place]  CM consult      [facility] Choice  HOME HEALTH   Choice offered to / List presented to:  C-1 Patient   DME arranged  CPM      DME agency  [place] arranged  [place].   Status of service:  Completed, signed off Medicare Important Message given?  YES (If response is "NO", the following Medicare IM given date fields will be blank) Date Medicare IM given:  01/26/2014 Date Additional Medicare IM given:    Discharge Disposition:  [place]  Per UR Regulation:    If discussed at Long Length of Stay Meetings, dates discussed:    Comments:  01/26/14 [place], RN BSN CM Pt for discharge on 01/27/14 with [facility] PT (per pts choice). Romualdo Bolk of [facility] is aware and will collect the pts information from the chart. [place] services to start within 48 hours of discharge. CPM will be deliver to pts home by [facility] (per pts choice) at discharge. Weekend nursing staff will call and fax orders to [facility] once written. Pt and pts nurse aware of discharge arrangements.  01/25/14 [place], RN BSN CM

## 2014-01-26 LAB — CBC
HCT: 42.2 % (ref 36.0–46.0)
Hemoglobin: 14.1 g/dL (ref 12.0–15.0)
MCH: 31.8 pg (ref 26.0–34.0)
MCHC: 33.4 g/dL (ref 30.0–36.0)
MCV: 95.3 fL (ref 78.0–100.0)
Platelets: ADEQUATE 10*3/uL (ref 150–400)
RBC: 4.43 MIL/uL (ref 3.87–5.11)
RDW: 13.3 % (ref 11.5–15.5)
WBC: 10.6 10*3/uL — ABNORMAL HIGH (ref 4.0–10.5)

## 2014-01-26 MED ORDER — OXYCODONE HCL 5 MG PO TABS
5.0000 mg | ORAL_TABLET | ORAL | Status: DC
  Administered 2014-01-26 – 2014-01-27 (×7): 5 mg via ORAL
  Filled 2014-01-26 (×7): qty 1

## 2014-01-26 MED ORDER — OXYCODONE-ACETAMINOPHEN 5-325 MG PO TABS
1.0000 | ORAL_TABLET | ORAL | Status: DC
  Administered 2014-01-26 – 2014-01-27 (×7): 1 via ORAL
  Filled 2014-01-26 (×7): qty 1

## 2014-01-26 NOTE — Progress Notes (Signed)
Subjective: 2 Days Post-Op Procedure(s) (LRB): TOTAL KNEE ARTHROPLASTY (Right) Patient reports pain as controlled.    Objective: Vital signs in last 24 hours: Temp:  [98 F (36.7 C)-98.6 F (37 C)] 98.1 F (36.7 C) (05/08 0559) Pulse Rate:  [64-77] 77 (05/08 0559) Resp:  [18] 18 (05/08 0559) BP: (95-110)/(59-98) 105/98 mmHg (05/08 0559) SpO2:  [96 %-99 %] 96 % (05/08 0559)  Intake/Output from previous day: 05/07 0701 - 05/08 0700 In: 1365.8 [P.O.:440; I.V.:925.8] Out: 730 [Urine:500; Drains:230] Intake/Output this shift:     Recent Labs  01/25/14 0513 01/26/14 0604  HGB 13.7 14.1    Recent Labs  01/25/14 0513 01/26/14 0604  WBC 11.5* 10.6*  RBC 4.28 4.43  HCT 40.5 42.2  PLT PLATELET CLUMPS NOTED ON SMEAR, COUNT APPEARS ADEQUATE PLATELET CLUMPS NOTED ON SMEAR, COUNT APPEARS ADEQUATE    Recent Labs  01/25/14 0513  NA 136*  K 4.1  CL 98  CO2 27  BUN 8  CREATININE 0.80  GLUCOSE 97  CALCIUM 8.8   No results found for this basename: LABPT, INR,  in the last 72 hours  Neurologically intact Sensation intact distally Intact pulses distally Dorsiflexion/Plantar flexion intact Incision: dressing C/D/I  Assessment/Plan: 2 Days Post-Op Procedure(s) (LRB): TOTAL KNEE ARTHROPLASTY (Right) Up with therapy  Carole Civil 01/26/2014, 7:24 AM

## 2014-01-26 NOTE — Progress Notes (Signed)
Physical Therapy Treatment Patient Details Name: Sara [name] MRN: [id number] DOB: [birth year] Today's Date: 01/26/2014    History of Present Illness      PT Comments    Pt progressing well towards goals.  Bed exercises complete with minimal difficulty.  Improved AAROM 9-78 degrees today.  Bed mobility complete with min assistance Rt LE with supine to sit.  Pt able to complete sit to stand with supervision with increased height from bed, no assistance required.  Increased distance gait training with good posture and min vc-ing required to equalize stance phase and stride length, pt able to complete 326ft SBA with RW/  Pt left in chair with call bell within reach and husband in room.  Ice applied end of session for pain and edema control.  Follow Up Recommendations        Equipment Recommendations       Recommendations for Other Services       Precautions / Restrictions Precautions Precautions: Knee Restrictions Weight Bearing Restrictions: Yes RLE Weight Bearing: Weight bearing as tolerated    Mobility  Bed Mobility Overal bed mobility: Modified Independent Bed Mobility: Sidelying to Sit   Sidelying to sit: Min assist Supine to sit: Supervision     General bed mobility comments: assistance with Rt LE from bed to floor  Transfers     Transfers: Sit to/from Stand Sit to Stand: Independent;From elevated surface         General transfer comment: Cues for hand placement and forward weight shift  Ambulation/Gait Ambulation/Gait assistance: Supervision Ambulation Distance (Feet): 320 Feet Assistive device: Rolling walker (2 wheeled) Gait Pattern/deviations: Decreased step length - left;Decreased stance time - right;Step-through pattern   Gait velocity interpretation: Below normal speed for age/gender     Stairs            Wheelchair Mobility    Modified Rankin (Stroke Patients Only)       Balance                                     Cognition Arousal/Alertness: Awake/alert Behavior During Therapy: WFL for tasks assessed/performed Overall Cognitive Status: Within Functional Limits for tasks assessed                      Exercises Total Joint Exercises Ankle Circles/Pumps: 10 reps;Supine Quad Sets: 10 reps;Supine Gluteal Sets: 10 reps;Supine Heel Slides: 10 reps;Supine;AAROM Hip ABduction/ADduction: 10 reps;Supine Knee Flexion: AAROM;Right;10 reps Goniometric ROM: 9-78    General Comments        Pertinent Vitals/Pain Pain scale 6/10, RN notified and pain medication given prior therapy today.    Home Living                      Prior Function            PT Goals (current goals can now be found in the care plan section) Progress towards PT goals: Progressing toward goals    Frequency       PT Plan Current plan remains appropriate  Begin stair training 2nd session today or tomorrow per pt pain tolerance.    Co-evaluation             End of Session Equipment Utilized During Treatment: Gait belt Activity Tolerance: Patient tolerated treatment well Patient left: in chair;with call bell/phone within reach;with family/visitor present     Time: 0254-2706 PT  Time Calculation (min): 42 min  Charges:  $Gait Training: 8-22 mins $Therapeutic Exercise: 8-22 mins $Therapeutic Activity: 8-22 mins                    G Codes:      Aldona Lento 01/26/2014, 10:28 AM

## 2014-01-26 NOTE — Plan of Care (Signed)
Problem: Phase I Progression Outcomes Goal: OOB as tolerated unless otherwise ordered Outcome: Completed/Met Date Met:  01/26/14 Patient has been up in chair and walks in room with walker to bathroom in no distress. Will continue to monitor.

## 2014-01-26 NOTE — Evaluation (Signed)
Occupational Therapy Evaluation Patient Details Name: Sara [name] MRN: [id number] DOB: 05/18/1946 Today's Date: 01/26/2014    History of Present Illness Pt is s/p R TKR.  Sara [name], 68 y.o. female, has a history of pain and functional disability in the right knee due to arthritis and has failed non-surgical conservative treatments for greater than 12 weeks to includeNSAID's and/or analgesics, corticosteriod injections, flexibility and strengthening excercises, supervised PT with diminished ADL's post treatment, use of assistive devices, activity modification and surgerical arthroscopy.  Onset of symptoms was gradual, starting several years ago and did not improve after arthroscopic surgery.  with gradually worsening course since that time.   Clinical Impression   Pt is presenting to acute OT with above situation.  She is grossly at min assist level with her ADLs currently.  Pt was educated on lower body dressing strategies and demonstrated good understanding.  At this time pt and husband are concerned with functional mobility in the home, specifically how to navigate the bathroom and tub transfers.  Recommend HHOT to strategize with pt and husband for increased safety with all bathroom mobility.    Follow Up Recommendations  Home health OT    Equipment Recommendations  Tub/shower bench    Recommendations for Other Services       Precautions / Restrictions Precautions Precautions: Knee Restrictions Weight Bearing Restrictions: Yes RLE Weight Bearing: Weight bearing as tolerated      Mobility Bed Mobility  Transfers               Balance                                            ADL Overall ADL's : Needs assistance/impaired             Lower Body Bathing: Minimal assistance       Lower Body Dressing: Minimal assistance             Tub/Shower Transfer Details (indicate cue type and reason): Pt and husband concerned about tub  transfers at home.  OTR suggested that pt will likely have improved right knee function when pt is given clearance to shower and also recommende TTB for ease of transfers.  Husband indicate that tub is 2 stpes above floor, also, and he is concerend about that entry.         Vision                     Perception     Praxis      Pertinent Vitals/Pain Pt receivingpain meds at start of session - pt in 6/10 pain     Hand Dominance Right   Extremity/Trunk Assessment Upper Extremity Assessment Upper Extremity Assessment: Overall WFL for tasks assessed   Lower Extremity Assessment Lower Extremity Assessment: Defer to PT evaluation       Communication Communication Communication: No difficulties   Cognition Arousal/Alertness: Awake/alert Behavior During Therapy: WFL for tasks assessed/performed Overall Cognitive Status: Within Functional Limits for tasks assessed                     General Comments       Exercises       Shoulder Instructions      Home Living Family/patient expects to be discharged to:: Private residence Living Arrangements: Spouse/significant other Available Help at Discharge: Family Type  of Home: House Home Access: Stairs to enter [employer] of Steps: 2   Home Layout: One level     Bathroom Shower/Tub: Teacher, early years/pre: Handicapped height Bathroom Accessibility: Yes   Home Equipment: Environmental consultant - 2 wheels;Bedside commode;Cane - quad;Cane - single point;Grab bars - toilet          Prior Functioning/Environment Level of Independence: Needs assistance    ADL's / Homemaking Assistance Needed: Pt at min assist with lower body dressing due to increaed knee pain        OT Diagnosis: Acute pain   OT Problem List: Decreased knowledge of use of DME or AE;Other (comment) (Decreased functional mobility in the home)   OT Treatment/Interventions:      OT Goals(Current goals can be found in the care plan  section) Acute Rehab OT Goals Patient Stated Goal: No OT goals needed ADL Goals Additional ADL Goal #1: Pt will be educated on lower extremity dressing techniques  OT Frequency:     Barriers to D/C:            Co-evaluation              End of Session    Activity Tolerance: Patient tolerated treatment well Patient left: with family/visitor present;in chair;with call bell/phone within reach   Time: 2409-7353 OT Time Calculation (min): 30 min Charges:  OT General Charges $OT Visit: 1 Procedure OT Evaluation $Initial OT Evaluation Tier I: 1 Procedure OT Treatments $Self Care/Home Management : 8-22 mins G-Codes:     Bea Graff, [place], OTR/L [phone]  01/26/2014, 12:25 PM

## 2014-01-26 NOTE — Progress Notes (Signed)
Physical Therapy Treatment Patient Details Name: Sara [name] MRN: [id number] DOB: [birth year] Today's Date: 01/26/2014    History of Present Illness      PT Comments    Pt progressing well towards goals.  Session focus on bed exercises for strengthening and to improve ROM.  AAROM 9-85 2nd session today.  CPM increase ROM 0-70 x 8 hours.  RN informed and time wrote on dry erase board.  Ice applied for pain and edema control.    Follow Up Recommendations        Equipment Recommendations       Recommendations for Other Services       Precautions / Restrictions Precautions Precautions: Knee Restrictions Weight Bearing Restrictions: Yes RLE Weight Bearing: Weight bearing as tolerated    Mobility  Bed Mobility                  Transfers                    Ambulation/Gait                 Stairs            Wheelchair Mobility    Modified Rankin (Stroke Patients Only)       Balance                                    Cognition Arousal/Alertness: Awake/alert Behavior During Therapy: WFL for tasks assessed/performed Overall Cognitive Status: Within Functional Limits for tasks assessed                      Exercises Total Joint Exercises Ankle Circles/Pumps: AROM;Both;15 reps;Supine Quad Sets: 10 reps;Supine Gluteal Sets: 10 reps;Supine Heel Slides: 10 reps;Supine;AAROM Hip ABduction/ADduction: 10 reps;Supine Knee Flexion: AAROM;Right;10 reps Goniometric ROM: 9-85    General Comments        Pertinent Vitals/Pain Pain scale 3/10 initially, pain increased to 5-6/10 end of session following exercises.    Home Living                      Prior Function            PT Goals (current goals can now be found in the care plan section) Progress towards PT goals: Progressing toward goals    Frequency       PT Plan Current plan remains appropriate    Co-evaluation             End of  Session   Activity Tolerance: Patient tolerated treatment well Patient left: in bed;in CPM;with call bell/phone within reach;with family/visitor present     Time: 2202-5427 PT Time Calculation (min): 30 min  Charges:  $Therapeutic Exercise: 23-37 mins $Therapeutic Activity: 8-22 mins                    G Codes:      Aldona Lento 01/26/2014, 4:55 PM

## 2014-01-27 LAB — CBC
HCT: 33.8 % — ABNORMAL LOW (ref 36.0–46.0)
Hemoglobin: 11.2 g/dL — ABNORMAL LOW (ref 12.0–15.0)
MCH: 31.4 pg (ref 26.0–34.0)
MCHC: 33.1 g/dL (ref 30.0–36.0)
MCV: 94.7 fL (ref 78.0–100.0)
Platelets: 216 10*3/uL (ref 150–400)
RBC: 3.57 MIL/uL — ABNORMAL LOW (ref 3.87–5.11)
RDW: 13.4 % (ref 11.5–15.5)
WBC: 7.9 10*3/uL (ref 4.0–10.5)

## 2014-01-27 MED ORDER — POLYETHYLENE GLYCOL 3350 17 G PO PACK: 17.0000 g | PACK | Freq: Every day | ORAL | Status: DC

## 2014-01-27 MED ORDER — ASPIRIN 325 MG PO TBEC: 325.0000 mg | DELAYED_RELEASE_TABLET | Freq: Every day | ORAL | Status: DC

## 2014-01-27 MED ORDER — OXYCODONE-ACETAMINOPHEN 10-325 MG PO TABS: 1.0000 | ORAL_TABLET | ORAL | Status: DC | PRN

## 2014-01-27 NOTE — Progress Notes (Signed)
Discharge instructions reviewed with patient and husband. Understanding verbalized. Dressing change, home health with CPM, and wearing ted hose discussed. Physical therapy working with at this time.

## 2014-01-27 NOTE — Discharge Summary (Signed)
  Discharge summary  Admission date 01/24/2014 Discharge date Jan 27 2014  Admitting diagnosis osteoarthritis right knee Discharge diagnosis same  Operative procedure right total knee  Implants [employer] sigma fixed-bearing posterior stabilized total knee arthroplasty  Hospital course the patient had an unremarkable hospital course, follow physical therapy protocols without difficulty. Ambulated over 300 feet. Tolerated CPM up to 75 and obtained active assisted flexion of 80.    Medication List    STOP taking these medications       HYDROcodone-acetaminophen 10-325 MG per tablet  Commonly known as:  NORCO      TAKE these medications       aspirin 325 MG EC tablet  Take 1 tablet (325 mg total) by mouth daily with breakfast.     diphenhydrAMINE 25 MG tablet  Commonly known as:  SOMINEX  Take 50 mg by mouth at bedtime as needed for allergies or sleep.     FAST FREEZE PRO STYLE THERAPY EX  Apply 1 application topically 2 (two) times daily as needed (knee pain).     fluticasone 50 MCG/ACT nasal spray  Commonly known as:  FLONASE  Place 2 sprays into the nose at bedtime.     gabapentin 600 MG tablet  Commonly known as:  NEURONTIN  Take 300 mg by mouth at bedtime.     levothyroxine 88 MCG tablet  Commonly known as:  SYNTHROID, LEVOTHROID  Take 88 mcg by mouth daily before breakfast.     LORazepam 1 MG tablet  Commonly known as:  ATIVAN  Take 1 mg by mouth at bedtime as needed for sleep. For sleep     losartan-hydrochlorothiazide 100-12.5 MG per tablet  Commonly known as:  HYZAAR  Take 1 tablet by mouth daily.     metoprolol tartrate 25 MG tablet  Commonly known as:  LOPRESSOR  Take 25 mg by mouth 2 (two) times daily.     omeprazole 40 MG capsule  Commonly known as:  PRILOSEC  Take 40 mg by mouth daily before breakfast.     oxyCODONE-acetaminophen 10-325 MG per tablet  Commonly known as:  PERCOCET  Take 1 tablet by mouth every 4 (four) hours as needed for pain.      polyethylene glycol packet  Commonly known as:  MIRALAX / GLYCOLAX  Take 17 g by mouth daily.     pravastatin 40 MG tablet  Commonly known as:  PRAVACHOL  Take 40 mg by mouth daily with breakfast.        Followup May 21 for staple removal  CPM at home 6-8 hours a day for 3 weeks  TED hose for 4 weeks Aspirin for 30 days  Hemoglobin & Hematocrit     Component Value Date/Time   HGB 11.2* 01/27/2014 0611   HCT 33.8* 01/27/2014 6144

## 2014-01-27 NOTE — Progress Notes (Signed)
Physical Therapy Treatment Patient Details Name: Sara [name] MRN: [id number] DOB: 08/04/1946 Today's Date: 01/27/2014    History of Present Illness      PT Comments    Pt progressing well towards all goals.  Improved gait mechanics with appropriate posture, heel to toe pattern, knee flexion with slight decreased stance phase Rt LE.  SBA x 340 feet with RW with no LOB episodes.  Stair training complete prior discharge, completed stair training with RW, no handrail use to simulate stairs at home.  Pt able to demonstrate and verbalize appropriate sequencing ascend and descending stairs.  Improved AAROM to 6-95 degrees.  Pt left in bed with husband in room and call bell within reach and ready to go home.  Slight increased in pain following gait training to 2/10 Rt knee.  Follow Up Recommendations        Equipment Recommendations       Recommendations for Other Services       Precautions / Restrictions Precautions Precautions: Knee Restrictions Weight Bearing Restrictions: Yes RLE Weight Bearing: Weight bearing as tolerated    Mobility  Bed Mobility         Supine to sit: Modified independent (Device/Increase time)     General bed mobility comments: assistance with Rt LE from bed to floor  Transfers Overall transfer level: Modified independent Equipment used: Rolling walker (2 wheeled) Transfers: Sit to/from Stand Sit to Stand: Independent;From elevated surface         General transfer comment: Cues for hand placement and forward weight shift  Ambulation/Gait Ambulation/Gait assistance: Supervision Ambulation Distance (Feet): 340 Feet Assistive device: Rolling walker (2 wheeled) Gait Pattern/deviations: Decreased stance time - right   Gait velocity interpretation: at or above normal speed for age/gender     Stairs Stairs: Yes Stairs assistance: Supervision Stair Management: Backwards;With walker Number of Stairs: 3    Wheelchair Mobility     Modified Rankin (Stroke Patients Only)       Balance                                    Cognition Arousal/Alertness: Awake/alert Behavior During Therapy: WFL for tasks assessed/performed Overall Cognitive Status: Within Functional Limits for tasks assessed                      Exercises Total Joint Exercises Ankle Circles/Pumps: AROM;Both;15 reps;Supine Heel Slides: 10 reps;Supine;AAROM Goniometric ROM: 6-95 degrees    General Comments        Pertinent Vitals/Pain Pain free at therapist entrance, increased Rt knee pain to 2/10 following gait training.      Home Living                      Prior Function            PT Goals (current goals can now be found in the care plan section) Progress towards PT goals: Progressing toward goals    Frequency       PT Plan Current plan remains appropriate    Co-evaluation             End of Session Equipment Utilized During Treatment: Gait belt Activity Tolerance: Patient tolerated treatment well Patient left: in bed;with family/visitor present     Time: 0940-1005 PT Time Calculation (min): 25 min  Charges:  $Gait Training: 8-22 mins $Therapeutic Exercise: 8-22 mins  G Codes:      Aldona Lento 01/27/2014, 10:26 AM

## 2014-01-28 LAB — TYPE AND SCREEN
ABO/RH(D): B POS
Antibody Screen: NEGATIVE
Unit division: 0
Unit division: 0

## 2014-01-30 ENCOUNTER — Emergency Department (HOSPITAL_COMMUNITY)
Admission: EM | Admit: 2014-01-30 | Discharge: 2014-01-30 | Disposition: A | Discharge status: Home / Self Care | Payer: PRIVATE HEALTH INSURANCE | Attending: Emergency Medicine | Admitting: Emergency Medicine

## 2014-01-30 ENCOUNTER — Telehealth: Payer: Self-pay | Admitting: Orthopedic Surgery

## 2014-01-30 ENCOUNTER — Emergency Department (HOSPITAL_COMMUNITY): Payer: PRIVATE HEALTH INSURANCE

## 2014-01-30 ENCOUNTER — Encounter (HOSPITAL_COMMUNITY): Payer: Self-pay | Admitting: Emergency Medicine

## 2014-01-30 DIAGNOSIS — IMO0002 Reserved for concepts with insufficient information to code with codable children: Secondary | ICD-10-CM | POA: Insufficient documentation

## 2014-01-30 DIAGNOSIS — Z87440 Personal history of urinary (tract) infections: Secondary | ICD-10-CM | POA: Insufficient documentation

## 2014-01-30 DIAGNOSIS — K219 Gastro-esophageal reflux disease without esophagitis: Secondary | ICD-10-CM | POA: Insufficient documentation

## 2014-01-30 DIAGNOSIS — Z7982 Long term (current) use of aspirin: Secondary | ICD-10-CM | POA: Insufficient documentation

## 2014-01-30 DIAGNOSIS — Z79899 Other long term (current) drug therapy: Secondary | ICD-10-CM | POA: Insufficient documentation

## 2014-01-30 DIAGNOSIS — H81399 Other peripheral vertigo, unspecified ear: Secondary | ICD-10-CM | POA: Insufficient documentation

## 2014-01-30 DIAGNOSIS — Z87891 Personal history of nicotine dependence: Secondary | ICD-10-CM | POA: Insufficient documentation

## 2014-01-30 DIAGNOSIS — G8918 Other acute postprocedural pain: Secondary | ICD-10-CM | POA: Insufficient documentation

## 2014-01-30 DIAGNOSIS — M79609 Pain in unspecified limb: Secondary | ICD-10-CM | POA: Insufficient documentation

## 2014-01-30 DIAGNOSIS — Z9889 Other specified postprocedural states: Secondary | ICD-10-CM | POA: Insufficient documentation

## 2014-01-30 DIAGNOSIS — E890 Postprocedural hypothyroidism: Secondary | ICD-10-CM | POA: Insufficient documentation

## 2014-01-30 DIAGNOSIS — Z96659 Presence of unspecified artificial knee joint: Secondary | ICD-10-CM | POA: Insufficient documentation

## 2014-01-30 DIAGNOSIS — E7800 Pure hypercholesterolemia, unspecified: Secondary | ICD-10-CM | POA: Insufficient documentation

## 2014-01-30 DIAGNOSIS — G8929 Other chronic pain: Secondary | ICD-10-CM | POA: Insufficient documentation

## 2014-01-30 DIAGNOSIS — Z8709 Personal history of other diseases of the respiratory system: Secondary | ICD-10-CM | POA: Insufficient documentation

## 2014-01-30 DIAGNOSIS — I10 Essential (primary) hypertension: Secondary | ICD-10-CM | POA: Insufficient documentation

## 2014-01-30 DIAGNOSIS — Z91040 Latex allergy status: Secondary | ICD-10-CM | POA: Insufficient documentation

## 2014-01-30 MED ORDER — ACETAMINOPHEN 325 MG PO TABS
650.0000 mg | ORAL_TABLET | Freq: Once | ORAL | Status: AC
  Administered 2014-01-30: 650 mg via ORAL
  Filled 2014-01-30: qty 2

## 2014-01-30 MED ORDER — OXYCODONE-ACETAMINOPHEN 5-325 MG PO TABS
1.0000 | ORAL_TABLET | Freq: Once | ORAL | Status: DC
  Filled 2014-01-30: qty 1

## 2014-01-30 NOTE — Discharge Instructions (Signed)
You were evaluated today for calf pain following surgery. There is no evidence of blood clot at this time. This may just be post operative pain. He is to followup with your orthopedist as scheduled. He should return for any new or worsening symptoms including increased swelling, redness, shortness of breath, chest pain.

## 2014-01-30 NOTE — ED Provider Notes (Signed)
CSN: [id number]     Arrival date & time 01/30/14  4765 History  This chart was scribed for Merryl Hacker, MD by Eston Mould, ED Scribe. This patient was seen in room APA07/APA07 and the patient's care was started at 7:10 AM.   Chief Complaint  Patient presents with  . Knee Pain   The history is provided by the patient. No language interpreter was used.   HPI Comments: Sara [name] is a 68 y.o. female who presents to the Emergency Department complaining of sudden constant R calf pain that began 5 hours ago. Pt states she had R knee replacement surgery 01/23/2014 done by Dr. Melford Aase and states she began experiencing sudden constant dull R calf pain 5 hours ago. Pt states "she feels he whole R leg is swollen". She states she has begun physical therapy and is up walking. She rates her current pain 5/10 to R calf. Pt has a family hx of blood clots and states her mother died from a blood clot post surgery. She denies recent fevers, chills, or other injuries.   Past Medical History  Diagnosis Date  . Thyroid disease   . Chronic back pain   . Reflux   . Unspecified arthropathy, shoulder region   . Pure hypercholesterolemia     takes Pravastin daily  . Allergic rhinitis due to pollen   . Cervicalgia   . Diverticulosis of colon (without mention of hemorrhage)   . Morbid obesity   . Palpitations   . Peptic ulcer, unspecified site, unspecified as acute or chronic, without mention of hemorrhage, perforation, or obstruction   . Peripheral vertigo, unspecified     takes Meclizine prn  . Reflux esophagitis   . Spinal stenosis in cervical region   . 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 Personal history of tobacco use, presenting hazards to health   . Insomnia, unspecified   . Lumbago   . Right leg pain   . Shortness of breath   . H/O hiatal hernia   . HTN (hypertension)     takes Metoprolol and  Lisinopril daily  . Hypertension   . Sleep apnea     slight but doesn't require a CPAP  . History of bronchitis     > 98yr ago  . Seasonal allergies     uses Flonase daily  . Headache(784.0)     denies migraines since the 80's but has occ and takes Butalbital prn  . Cervical spondylosis without myelopathy     all over  . Degeneration of cervical intervertebral disc   . Primary localized osteoarthrosis, lower leg   . GERD (gastroesophageal reflux disease)     takes Omeprazole daily  . Constipation   . Diverticulosis   . History of bladder infections   . Urinary incontinence     occasionally  . Other postablative hypothyroidism     takes SYnthroid daily  . Insomnia     takes Ativan nigtly   Past Surgical History  Procedure Laterality Date  . Knee surgery      arthroscopy-- R  . Tonsillectomy    . Appendectomy    . Colonoscopy  Sept 2008    RMR: normal rectum, left-sided diverticula, repeat in 2018  . Abdominal hysterectomy    . Upper gastrointestinal endoscopy    . Breast lumpectomy      left   . Eye surgery  cataracts removed- bilateral, /w IOL  . Lumpectomy on pelvis    . Diagnostic laparoscopy    . Tubal ligation    . Esophagogastroduodenoscopy    . Anterior lat lumbar fusion  05/13/2012    Procedure: ANTERIOR LATERAL LUMBAR FUSION 2 LEVELS;  Surgeon: Faythe Ghee, MD;  Location: [place] [facility];  Service: Neurosurgery;  Laterality: Left;  Left Lumbar Three-four,Lumbar four-five Extreme Lumbar Interbody Fusion with Percutaneous Pedicle Screws  . Spinal fusion    . Knee arthroscopy with lateral menisectomy Right 10/06/2013    Procedure: KNEE ARTHROSCOPY WITH LATERAL AND MEDIAL MENISECTOMY;  Surgeon: Carole Civil, MD;  Location: [facility];  Service: Orthopedics;  Laterality: Right;  END @ 1234  . Foreign body removal Right 10/06/2013    Procedure: REMOVAL FOREIGN BODY EXTREMITY;  Surgeon: Carole Civil, MD;  Location: [facility];  Service: Orthopedics;   Laterality: Right;  . Injection knee Left 10/06/2013    Procedure: KNEE INJECTION;  Surgeon: Carole Civil, MD;  Location: [facility];  Service: Orthopedics;  Laterality: Left;  . Total knee arthroplasty Right 01/24/2014    Procedure: TOTAL KNEE ARTHROPLASTY;  Surgeon: Carole Civil, MD;  Location: [facility];  Service: Orthopedics;  Laterality: Right;   Family History  Problem Relation Age of Onset  . Colon cancer Neg Hx    History  Substance Use Topics  . Smoking status: Former Smoker -- 1.00 packs/day for 27 years    Types: Cigarettes  . Smokeless tobacco: Former Systems developer    Quit date: 10/02/1985  . Alcohol Use: Yes     Comment: occasional wine(red)   OB History   Grav Para Term Preterm Abortions TAB SAB Ect Mult Living                 Review of Systems  Constitutional: Negative for fever.  Respiratory: Negative for shortness of breath.   Cardiovascular: Positive for leg swelling. Negative for chest pain.  Musculoskeletal:       Right calf pain  All other systems reviewed and are negative.   Allergies  Darvon; Propoxyphene hcl; Camphor; Dust mite extract; Molds & smuts; Tomato; and Latex  Home Medications   Prior to Admission medications   Medication Sig Start Date End Date Taking? Authorizing Provider  aspirin EC 325 MG EC tablet Take 1 tablet (325 mg total) by mouth daily with breakfast. 01/27/14  Yes Carole Civil, MD  diphenhydrAMINE (SOMINEX) 25 MG tablet Take 50 mg by mouth at bedtime as needed for allergies or sleep.   Yes Historical Provider, MD  fluticasone (FLONASE) 50 MCG/ACT nasal spray Place 2 sprays into the nose at bedtime.  11/23/11  Yes Historical Provider, MD  gabapentin (NEURONTIN) 600 MG tablet Take 300 mg by mouth at bedtime.    Yes Historical Provider, MD  levothyroxine (SYNTHROID, LEVOTHROID) 88 MCG tablet Take 88 mcg by mouth daily before breakfast.    Yes Historical Provider, MD  LORazepam (ATIVAN) 1 MG tablet Take 1 mg by mouth at bedtime as  needed for sleep. For sleep   Yes Historical Provider, MD  losartan-hydrochlorothiazide (HYZAAR) 100-12.5 MG per tablet Take 1 tablet by mouth daily.  09/29/12  Yes Historical Provider, MD  Menthol, Topical Analgesic, (FAST FREEZE PRO STYLE THERAPY EX) Apply 1 application topically 2 (two) times daily as needed (knee pain).   Yes Historical Provider, MD  metoprolol tartrate (LOPRESSOR) 25 MG tablet Take 25 mg by mouth 2 (two) times daily.    Yes  Historical Provider, MD  omeprazole (PRILOSEC) 40 MG capsule Take 40 mg by mouth daily before breakfast.    Yes Historical Provider, MD  oxyCODONE-acetaminophen (PERCOCET) 10-325 MG per tablet Take 1 tablet by mouth every 4 (four) hours as needed for pain. 01/27/14  Yes Carole Civil, MD  polyethylene glycol [facility] / GLYCOLAX) packet Take 17 g by mouth daily. 01/27/14  Yes Carole Civil, MD  pravastatin (PRAVACHOL) 40 MG tablet Take 40 mg by mouth daily with breakfast.    Yes Historical Provider, MD   BP 133/73  Pulse 58  Temp(Src) 98.1 F (36.7 C) (Oral)  Resp 18  SpO2 97%  Physical Exam  Nursing note and vitals reviewed. Constitutional: She is oriented to person, place, and time. She appears well-developed and well-nourished. No distress.  HENT:  Head: Normocephalic and atraumatic.  Cardiovascular: Normal rate and regular rhythm.   Pulmonary/Chest: Effort normal. No respiratory distress.  Musculoskeletal:  Mild asymmetric swelling over the right knee and calf, no significant tenderness palpation of the right calf, anterior vertical incision over the knee clean dry and intact with staples in place, no drainage or erythema noted  Neurological: She is alert and oriented to person, place, and time.  Skin: Skin is warm and dry.  Psychiatric: She has a normal mood and affect.    ED Course  Procedures  DIAGNOSTIC STUDIES: Oxygen Saturation is 98% on RA, normal by my interpretation.    COORDINATION OF CARE: 7:13 AM-Discussed treatment plan  which includes R leg ultrasound and administer pain medication while in the ED. Pt agreed to plan.   Labs Review Labs Reviewed - No data to display  Imaging Review US Venous Img Lower Unilateral Right  01/30/2014   CLINICAL DATA:  Recent right total knee joint replacement now with pain and swelling in the right lower extremity  EXAM: Right LOWER EXTREMITY VENOUS DOPPLER ULTRASOUND  TECHNIQUE: Gray-scale sonography with graded compression, as well as color Doppler and duplex ultrasound were performed to evaluate the lower extremity deep venous systems from the level of the common femoral vein and including the common femoral, femoral, profunda femoral, popliteal and calf veins including the posterior tibial, peroneal and gastrocnemius veins when visible. The superficial great saphenous vein was also interrogated. Spectral Doppler was utilized to evaluate flow at rest and with distal augmentation maneuvers in the common femoral, femoral and popliteal veins.  COMPARISON:  None.  FINDINGS: Common Femoral Vein: No evidence of thrombus. Normal compressibility, respiratory phasicity and response to augmentation.  Saphenofemoral Junction: No evidence of thrombus. Normal compressibility and flow on color Doppler imaging.  Profunda Femoral Vein: No evidence of thrombus. Normal compressibility and flow on color Doppler imaging.  Femoral Vein: No evidence of thrombus. Normal compressibility, respiratory phasicity and response to augmentation.  Popliteal Vein: No evidence of thrombus. Normal compressibility, respiratory phasicity and response to augmentation.  Calf Veins: No evidence of thrombus. Normal compressibility and flow on color Doppler imaging.  Superficial Great Saphenous Vein: No evidence of thrombus. Normal compressibility and flow on color Doppler imaging.  Venous Reflux:  None.  Other Findings:  None.  IMPRESSION: No evidence of deep venous thrombosis in the right lower extremity. No superficial thrombosis  is demonstrated either.   Electronically Signed   By: David  Martinique   On: 01/30/2014 08:35     EKG Interpretation None      MDM   Final diagnoses:  Post-op pain   Patient presents with right calf pain following the surgery.  She is nontoxic on exam.  Vital signs are within normal limits and patient is afebrile. Patient at risk for DVT. Will obtain ultrasound.  8:52 AM [place] neg for dvt.  Patient informed of results.  Patient to f/u with orthopedist as scheduled.  Given strict return precautions.  After history, exam, and medical workup I feel the patient has been appropriately medically screened and is safe for discharge home. Pertinent diagnoses were discussed with the patient. Patient was given return precautions.   I personally performed the services described in this documentation, which was scribed in my presence. The recorded information has been reviewed and is accurate.    Merryl Hacker, MD 01/30/14 [phone]

## 2014-01-30 NOTE — ED Notes (Signed)
Left calf measures37.5 cm  & right calf measures 41 cm. Right knee is still swollen from surgery.

## 2014-01-30 NOTE — ED Notes (Signed)
Pt reports she started having pain in her right calf that started about 0230 this morning. Pt had total knee replaced wed of last week.

## 2014-01-30 NOTE — ED Notes (Signed)
Pt states did not want the percocet and would rather have just tylenol. EDP aware. Aware awaiting [place]

## 2014-01-30 NOTE — Telephone Encounter (Signed)
[employer] brace representative, Elmyra Ricks, relayed that patient did not get the VenaPro brace in time of her surgery; states Reed Breech was unable to reach patient in time before her admission to hospital.  She has since been discharged to [facility] for rehab, where she is currently.  Elmyra Ricks wanted you to be aware, in the event that patient may need some type of brace.  Please advise.

## 2014-01-31 ENCOUNTER — Telehealth: Payer: Self-pay | Admitting: Orthopedic Surgery

## 2014-01-31 NOTE — Telephone Encounter (Signed)
Noted.  Patient will be here for scheduled appointment tomorrow, 02/01/14.

## 2014-01-31 NOTE — Telephone Encounter (Signed)
Not necessary.

## 2014-02-01 ENCOUNTER — Ambulatory Visit: Payer: PRIVATE HEALTH INSURANCE | Admitting: Orthopedic Surgery

## 2014-02-01 NOTE — Telephone Encounter (Signed)
Patient spoke with Dr. Aline Brochure in the evening of 01/31/14.  All is well and keep 5/21 appt.

## 2014-02-08 ENCOUNTER — Ambulatory Visit (INDEPENDENT_AMBULATORY_CARE_PROVIDER_SITE_OTHER): Payer: Self-pay | Admitting: Orthopedic Surgery

## 2014-02-08 VITALS — BP 114/67 | Ht 66.0 in | Wt 213.0 lb

## 2014-02-08 DIAGNOSIS — Z96659 Presence of unspecified artificial knee joint: Secondary | ICD-10-CM

## 2014-02-08 MED ORDER — OXYCODONE-ACETAMINOPHEN 10-325 MG PO TABS: 1.0000 | ORAL_TABLET | ORAL | Status: DC | PRN

## 2014-02-08 NOTE — Progress Notes (Signed)
Patient ID: Sara [name], female   DOB: May 19, 1946, 68 y.o.   MRN: [id number]  Postop visit status post total knee arthroplasty Date of surgery  5.6.2015 Diagnosis  Osteoarthritis Operative findings oa knee Implant Depuy  posterior stabilized total knee replacement DVT prophylaxis Ecotrin twice a day with TED hose for 6 weeks  Living situation home Complaints none  Exam clean wound, 95 flexion  Plan: recheck 4 weeks  Meds ordered this encounter  Medications  . oxyCODONE-acetaminophen (PERCOCET) 10-325 MG per tablet    Sig: Take 1 tablet by mouth every 4 (four) hours as needed for pain.    Dispense:  70 tablet    Refill:  0

## 2014-02-15 ENCOUNTER — Ambulatory Visit (HOSPITAL_COMMUNITY)
Admission: RE | Admit: 2014-02-15 | Discharge: 2014-02-15 | Disposition: A | Discharge status: Home / Self Care | Payer: PRIVATE HEALTH INSURANCE | Source: Ambulatory Visit | Attending: Orthopedic Surgery | Admitting: Orthopedic Surgery

## 2014-02-15 DIAGNOSIS — M25469 Effusion, unspecified knee: Secondary | ICD-10-CM | POA: Diagnosis not present

## 2014-02-15 DIAGNOSIS — Z96659 Presence of unspecified artificial knee joint: Secondary | ICD-10-CM

## 2014-02-15 DIAGNOSIS — R262 Difficulty in walking, not elsewhere classified: Secondary | ICD-10-CM | POA: Insufficient documentation

## 2014-02-15 DIAGNOSIS — Z9889 Other specified postprocedural states: Secondary | ICD-10-CM | POA: Diagnosis not present

## 2014-02-15 DIAGNOSIS — E669 Obesity, unspecified: Secondary | ICD-10-CM | POA: Diagnosis not present

## 2014-02-15 DIAGNOSIS — M25569 Pain in unspecified knee: Secondary | ICD-10-CM | POA: Diagnosis not present

## 2014-02-15 DIAGNOSIS — T8489XA Other specified complication of internal orthopedic prosthetic devices, implants and grafts, initial encounter: Secondary | ICD-10-CM

## 2014-02-15 DIAGNOSIS — I10 Essential (primary) hypertension: Secondary | ICD-10-CM | POA: Diagnosis not present

## 2014-02-15 DIAGNOSIS — IMO0001 Reserved for inherently not codable concepts without codable children: Secondary | ICD-10-CM | POA: Diagnosis present

## 2014-02-15 DIAGNOSIS — M25669 Stiffness of unspecified knee, not elsewhere classified: Secondary | ICD-10-CM | POA: Diagnosis not present

## 2014-02-15 NOTE — Evaluation (Signed)
Physical Therapy Evaluation  Patient Details  Name: Sara [name] MRN: [id number] Date of Birth: 1946-04-06  Today's Date: 02/15/2014 Time: 0347-4259 PT Time Calculation (min): 40 min Charge:  evaluation             Visit#: 1 of 12  Re-eval: 03/17/14 Assessment Diagnosis: Rt TKR Next MD Visit: 01/24/2014 Prior Therapy: HH  Authorization: [employer] medicare    Authorization Visit#: 1 of 10   Past Medical History:  Past Medical History  Diagnosis Date  . Thyroid disease   . Chronic back pain   . Reflux   . Unspecified arthropathy, shoulder region   . Pure hypercholesterolemia     takes Pravastin daily  . Allergic rhinitis due to pollen   . Cervicalgia   . Diverticulosis of colon (without mention of hemorrhage)   . Morbid obesity   . Palpitations   . Peptic ulcer, unspecified site, unspecified as acute or chronic, without mention of hemorrhage, perforation, or obstruction   . Peripheral vertigo, unspecified     takes Meclizine prn  . Reflux esophagitis   . Spinal stenosis in cervical region   . 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 Personal history of tobacco use, presenting hazards to health   . Insomnia, unspecified   . Lumbago   . Right leg pain   . Shortness of breath   . H/O hiatal hernia   . HTN (hypertension)     takes Metoprolol and Lisinopril daily  . Hypertension   . Sleep apnea     slight but doesn't require a CPAP  . History of bronchitis     > 56yr ago  . Seasonal allergies     uses Flonase daily  . Headache(784.0)     denies migraines since the 80's but has occ and takes Butalbital prn  . Cervical spondylosis without myelopathy     all over  . Degeneration of cervical intervertebral disc   . Primary localized osteoarthrosis, lower leg   . GERD (gastroesophageal reflux disease)     takes Omeprazole daily  . Constipation   . Diverticulosis   . History of bladder  infections   . Urinary incontinence     occasionally  . Other postablative hypothyroidism     takes SYnthroid daily  . Insomnia     takes Ativan nigtly   Past Surgical History:  Past Surgical History  Procedure Laterality Date  . Knee surgery      arthroscopy-- R  . Tonsillectomy    . Appendectomy    . Colonoscopy  Sept 2008    RMR: normal rectum, left-sided diverticula, repeat in 2018  . Abdominal hysterectomy    . Upper gastrointestinal endoscopy    . Breast lumpectomy      left   . Eye surgery      cataracts removed- bilateral, /w IOL  . Lumpectomy on pelvis    . Diagnostic laparoscopy    . Tubal ligation    . Esophagogastroduodenoscopy    . Anterior lat lumbar fusion  05/13/2012    Procedure: ANTERIOR LATERAL LUMBAR FUSION 2 LEVELS;  Surgeon: Faythe Ghee, MD;  Location: [facility];  Service: Neurosurgery;  Laterality: Left;  Left Lumbar Three-four,Lumbar four-five Extreme Lumbar Interbody Fusion with Percutaneous Pedicle Screws  . Spinal fusion    . Knee arthroscopy with lateral menisectomy Right 10/06/2013    Procedure: KNEE ARTHROSCOPY WITH LATERAL  AND MEDIAL MENISECTOMY;  Surgeon: Carole Civil, MD;  Location: [facility];  Service: Orthopedics;  Laterality: Right;  END @ 1234  . Foreign body removal Right 10/06/2013    Procedure: REMOVAL FOREIGN BODY EXTREMITY;  Surgeon: Carole Civil, MD;  Location: [facility];  Service: Orthopedics;  Laterality: Right;  . Injection knee Left 10/06/2013    Procedure: KNEE INJECTION;  Surgeon: Carole Civil, MD;  Location: [facility];  Service: Orthopedics;  Laterality: Left;  . Total knee arthroplasty Right 01/24/2014    Procedure: TOTAL KNEE ARTHROPLASTY;  Surgeon: Carole Civil, MD;  Location: [facility];  Service: Orthopedics;  Laterality: Right;    Subjective Symptoms/Limitations Symptoms: Pt had a Rt TKR on 5/6 and was discharged to [facility] on 5/9.  She is now being referred to out patient therapy to maximize her functional  potential.  She is currently using a cane to ambulate with; she has not tried any of her house work yet.  She has limited her ambulation. How long can you sit comfortably?: 20 minutes How long can you stand comfortably?: 5-10 minutes How long can you walk comfortably?: ambulating with a cane for 10 minutes  Pain Assessment Currently in Pain?: Yes Pain Score: 1  (worst pain 5/10 at night waking 3 times a night) Pain Location: Knee Pain Orientation: Right Pain Type: Acute pain Pain Relieving Factors: medication Effect of Pain on Daily Activities: activity    Sensation/Coordination/Flexibility/Functional Tests Functional Tests Functional Tests: foto 39  Assessment RLE AROM (degrees) Right Knee Extension: 12 Right Knee Flexion: 103 RLE Strength Right Hip Flexion: 5/5 Right Hip Extension: 4/5 Right Hip ABduction: 5/5 Right Knee Flexion: 5/5 Right Knee Extension: 4/5 Right Ankle Dorsiflexion: 5/5  Exercise/Treatments Mobility/Balance  Static Standing Balance Single Leg Stance - Right Leg: 11 Single Leg Stance - Left Leg: 18   Stretches Active Hamstring Stretch: 3 reps;30 seconds   Standing SLS: x3    Supine Quad Sets: 10 reps    Prone  Hamstring Curl: 10 reps Hip Extension: 10 reps   Manual Therapy Manual Therapy: Edema management Edema Management: ankle to superior thigh manal techniques to decrease edema  Physical Therapy Assessment and Plan PT Assessment and Plan Clinical Impression Statement: Pt is s/p Rt TKR with decreased ROM, increased swelling and pain who has been referred to physical therapy to maximize her functinal abiliy.  The pt will benefit from skilled care to address these issues and maximize the pt functioning ability. Pt will benefit from skilled therapeutic intervention in order to improve on the following deficits: Abnormal gait;Decreased balance;Pain;Decreased range of motion;Decreased strength;Difficulty walking;Increased edema Rehab  Potential: Good PT Frequency: Min 3X/week PT Duration: 4 weeks PT Treatment/Interventions: Gait training;Therapeutic activities;Therapeutic exercise;Manual techniques PT Plan: Pt weakness is mainly in hamstring and hip extension work these , ROM and goal of decreasing swelling prior to advancing to stair climbing and other functional activities.    Goals PT Short Term Goals Time to Complete Short Term Goals: 2 weeks PT Short Term Goal 1: Pt Pain level to be no greater than a 3 to allow pt to only be waking 1-2 times a night PT Short Term Goal 2: ROM to be to 120 to allow pt to squat to pick up items off the floor PT Short Term Goal 3: Pt to be able to walk for 20 minutes without increased pain PT Short Term Goal 4: Pt to be ambulating without her cane inside. PT Long Term Goals Time to Complete Long  Term Goals: 4 weeks PT Long Term Goal 1: I in advance HEP PT Long Term Goal 2: Pain no greater than a 1/10 to allow pt to sleep through the night Long Term Goal 3: ROM less than 5 to allow normalized gt Long Term Goal 4: ambulating inside and outside without an assistive device PT Long Term Goal 5: Pt to be ambulating at least a mile three times a week for better health habits.  Additional PT Long Term Goals?: Yes PT Long Term Goal 6: able to go up and down steps in a reciprocal manner.  Problem List Patient Active Problem List   Diagnosis Date Noted  . Difficulty in walking(719.7) 02/15/2014  . Postoperative stiffness of total knee replacement 02/15/2014  . Pain in joint, lower leg 02/15/2014  . Abnormality of gait 10/11/2013  . S/P arthroscopy of right knee 10/09/2013  . Radicular pain of right lower extremity 07/25/2013  . Knee instability 07/25/2013  . Right knee pain 07/25/2013  . Chest pain 04/25/2013  . Patellar tendonitis 09/07/2012  . OA (osteoarthritis) of knee 02/09/2012  . GERD (gastroesophageal reflux disease) 12/01/2011  . Dysphagia 12/01/2011  . RUQ pain 12/01/2011   . Fatty liver 12/01/2011  . Hematochezia 12/01/2011  . INTERMITTENT VERTIGO 09/12/2010  . KNEE, ARTHRITIS, DEGEN./OSTEO 04/03/2009  . CHEST PAIN, RECURRENT 03/07/2009  . OTHER DYSPHAGIA 03/07/2009  . PALPITATIONS 02/06/2009  . LIVER FUNCTION TESTS, ABNORMAL, HX OF 01/01/2009  . MORBID OBESITY 12/31/2008  . PUD 12/31/2008  . BACK PAIN 12/24/2008  . ANSERINE BURSITIS 12/24/2008  . BENIGN POSITIONAL VERTIGO 07/16/2008  . H N P-LUMBAR 04/02/2008  . SPINAL STENOSIS, CERVICAL 02/15/2008  . SPINAL STENOSIS, LUMBAR 02/15/2008  . Patellar tendinitis 10/19/2007  . TEAR LATERAL MENISCUS 06/13/2007  . OBESITY NOS 10/29/2006  . ALLERGIC RHINITIS, SEASONAL 10/29/2006  . HYPOTHYROIDISM 09/07/2006  . HYPERLIPIDEMIA 09/07/2006  . HYPERTENSION 09/07/2006  . GERD 09/07/2006  . DIVERTICULOSIS, COLON 09/07/2006  . OVERACTIVE BLADDER 09/07/2006  . FIBROCYSTIC BREAST DISEASE 09/07/2006  . OSTEOARTHRITIS 09/07/2006    PT Plan of Care PT Home Exercise Plan: given  GP Functional Assessment Tool Used: foto Functional Limitation: Mobility: Walking and moving around Mobility: Walking and Moving Around Current Status (L9767): At least 60 percent but less than 80 percent impaired, limited or restricted Mobility: Walking and Moving Around Goal Status [phone]): At least 40 percent but less than 60 percent impaired, limited or restricted  Leeroy Cha 02/15/2014, 5:29 PM  Physician Documentation Your signature is required to indicate approval of the treatment plan as stated above.  Please sign and either send electronically or make a copy of this report for your files and return this physician signed original.   Please mark one 1.__approve of plan  2. ___approve of plan with the following conditions.   ______________________________                                                          _____________________ Physician Signature  Date  

## 2014-02-20 ENCOUNTER — Telehealth: Payer: Self-pay | Admitting: Orthopedic Surgery

## 2014-02-20 ENCOUNTER — Ambulatory Visit (HOSPITAL_COMMUNITY)
Admission: RE | Admit: 2014-02-20 | Discharge: 2014-02-20 | Disposition: A | Discharge status: Home / Self Care | Payer: PRIVATE HEALTH INSURANCE | Source: Ambulatory Visit | Attending: Family Medicine | Admitting: Family Medicine

## 2014-02-20 DIAGNOSIS — M25669 Stiffness of unspecified knee, not elsewhere classified: Secondary | ICD-10-CM | POA: Insufficient documentation

## 2014-02-20 DIAGNOSIS — E669 Obesity, unspecified: Secondary | ICD-10-CM | POA: Diagnosis not present

## 2014-02-20 DIAGNOSIS — IMO0001 Reserved for inherently not codable concepts without codable children: Secondary | ICD-10-CM | POA: Diagnosis present

## 2014-02-20 DIAGNOSIS — I10 Essential (primary) hypertension: Secondary | ICD-10-CM | POA: Diagnosis not present

## 2014-02-20 DIAGNOSIS — M25469 Effusion, unspecified knee: Secondary | ICD-10-CM | POA: Insufficient documentation

## 2014-02-20 DIAGNOSIS — M25569 Pain in unspecified knee: Secondary | ICD-10-CM | POA: Insufficient documentation

## 2014-02-20 DIAGNOSIS — Z9889 Other specified postprocedural states: Secondary | ICD-10-CM | POA: Insufficient documentation

## 2014-02-20 DIAGNOSIS — Z96659 Presence of unspecified artificial knee joint: Secondary | ICD-10-CM

## 2014-02-20 DIAGNOSIS — T8489XA Other specified complication of internal orthopedic prosthetic devices, implants and grafts, initial encounter: Secondary | ICD-10-CM

## 2014-02-20 DIAGNOSIS — R262 Difficulty in walking, not elsewhere classified: Secondary | ICD-10-CM | POA: Diagnosis not present

## 2014-02-20 NOTE — Telephone Encounter (Signed)
Are you serious?  When the prescription runs out just like any other prescription

## 2014-02-20 NOTE — Telephone Encounter (Signed)
Routing to Dr Harrison 

## 2014-02-20 NOTE — Progress Notes (Signed)
Physical Therapy Treatment Patient Details  Name: CASON DABNEY MRN: [id number] Date of Birth: July 23, 1946  Today's Date: 02/20/2014 Time: 1023-1120 PT Time Calculation (min): 57 min Charge there ex [id number]; manual 1045-1102; IP 1105-1120 Visit#: 2 of 12  Re-eval: 03/17/14    Authorization: [place] health care medicare   Authorization Visit#: 2 of 10   Subjective: Symptoms/Limitations Symptoms: Pt states that she feels more tight than anything else. Pain Assessment Pain Score: 2  Pain Location: Knee Pain Orientation: Right   Exercise/Treatments   Stretches Active Hamstring Stretch: 3 reps;30 seconds Aerobic Stationary Bike: nustep L3 x 8' Standing SLS: x3   Supine Quad Sets: 10 reps Heel Slides: 10 reps (with emphasis on extension as well as flexion) Terminal Knee Extension: 10 reps Prone  Hamstring Curl: 10 reps;Limitations Hamstring Curl Limitations: 3# Hip Extension: 10 reps   Modalities Modalities: Cryotherapy Manual Therapy Manual Therapy: Edema management Edema Management: with manual from ankle to superior thigh Cryotherapy Number Minutes Cryotherapy: 10 Minutes Cryotherapy Location: Knee  Physical Therapy Assessment and Plan PT Assessment and Plan Clinical Impression Statement: Pt instructed on edema control (states she has been icing but not elevating).  Increased frequency of icing as assisted in pain control.  Pt completed exercises facilitated by therapist to improve extension followed by strengthening of hamstring and gluts.  Improved ROM noted Rehab Potential: Good PT Plan: add standing knee flex; slant board stretch.    Goals  progressing  Problem List Patient Active Problem List   Diagnosis Date Noted  . Difficulty in walking(719.7) 02/15/2014  . Postoperative stiffness of total knee replacement 02/15/2014  . Pain in joint, lower leg 02/15/2014  . Abnormality of gait 10/11/2013  . S/P arthroscopy of right knee 10/09/2013  .  Radicular pain of right lower extremity 07/25/2013  . Knee instability 07/25/2013  . Right knee pain 07/25/2013  . Chest pain 04/25/2013  . Patellar tendonitis 09/07/2012  . OA (osteoarthritis) of knee 02/09/2012  . GERD (gastroesophageal reflux disease) 12/01/2011  . Dysphagia 12/01/2011  . RUQ pain 12/01/2011  . Fatty liver 12/01/2011  . Hematochezia 12/01/2011  . INTERMITTENT VERTIGO 09/12/2010  . KNEE, ARTHRITIS, DEGEN./OSTEO 04/03/2009  . CHEST PAIN, RECURRENT 03/07/2009  . OTHER DYSPHAGIA 03/07/2009  . PALPITATIONS 02/06/2009  . LIVER FUNCTION TESTS, ABNORMAL, HX OF 01/01/2009  . MORBID OBESITY 12/31/2008  . PUD 12/31/2008  . BACK PAIN 12/24/2008  . ANSERINE BURSITIS 12/24/2008  . BENIGN POSITIONAL VERTIGO 07/16/2008  . H N P-LUMBAR 04/02/2008  . SPINAL STENOSIS, CERVICAL 02/15/2008  . SPINAL STENOSIS, LUMBAR 02/15/2008  . Patellar tendinitis 10/19/2007  . TEAR LATERAL MENISCUS 06/13/2007  . OBESITY NOS 10/29/2006  . ALLERGIC RHINITIS, SEASONAL 10/29/2006  . HYPOTHYROIDISM 09/07/2006  . HYPERLIPIDEMIA 09/07/2006  . HYPERTENSION 09/07/2006  . GERD 09/07/2006  . DIVERTICULOSIS, COLON 09/07/2006  . OVERACTIVE BLADDER 09/07/2006  . FIBROCYSTIC BREAST DISEASE 09/07/2006  . OSTEOARTHRITIS 09/07/2006       GP    Leeroy Cha 02/20/2014, 11:12 AM

## 2014-02-21 ENCOUNTER — Ambulatory Visit (HOSPITAL_COMMUNITY)
Admission: RE | Admit: 2014-02-21 | Discharge: 2014-02-21 | Disposition: A | Discharge status: Home / Self Care | Payer: PRIVATE HEALTH INSURANCE | Source: Ambulatory Visit | Attending: Family Medicine | Admitting: Family Medicine

## 2014-02-21 ENCOUNTER — Telehealth (HOSPITAL_COMMUNITY): Payer: Self-pay | Admitting: Physical Therapy

## 2014-02-21 DIAGNOSIS — IMO0001 Reserved for inherently not codable concepts without codable children: Secondary | ICD-10-CM | POA: Diagnosis not present

## 2014-02-21 NOTE — Progress Notes (Signed)
Physical Therapy Treatment Patient Details  Name: Sara [name] MRN: [id number] Date of Birth: 23-Dec-1945  Today's Date: 02/21/2014 Time: 0936-1030 PT Time Calculation (min): 54 min  Visit#: 3 of 12  Re-eval: 03/17/14 Authorization: [place] [employer] medicare  Authorization Visit#: 3 of 10  Charges:  therex [phone] (24'), manual 1001-1015 (14'), icepack 1017-1027 (10')  Subjective: Symptoms/Limitations Symptoms: Pt reports she does not do her exercises at home.  Currently with 4/10 pain in Rt knee however states it is more uncomfortable than painful today. Pain Assessment Currently in Pain?: Yes Pain Score: 4  Pain Location: Knee Pain Orientation: Right   Exercise/Treatments Stretches Active Hamstring Stretch: 3 reps;30 seconds Gastroc Stretch: 3 reps;30 seconds;Limitations Gastroc Stretch Limitations: slant board Aerobic Stationary Bike: nustep L3 x 8' Standing SLS: x3 max 16" Rt LE Supine Quad Sets: 10 reps Heel Slides: 10 reps   Modalities Modalities: Cryotherapy Manual Therapy Manual Therapy: Edema management Edema Management: retro massage Rt LE Cryotherapy Number Minutes Cryotherapy: 10 Minutes Cryotherapy Location: Knee Type of Cryotherapy: Ice pack  Physical Therapy Assessment and Plan PT Assessment and Plan Clinical Impression Statement: Reviewed established HEP from previous visit including standing hip abduction, sidelying hip abduction, hamstring stretch, quad sets, SLR.  Encouraged patient to perform these regularly at home for optimal results from therapy.  Pt able to complete all exercises in correct form.  Massage continues to decrease pain and swelling.  Finished session with icepack.   Rehab Potential: Good PT Plan: Add standing knee flexion next visit.  Progress per POC with focus on reducing swelling and improving ROM.     Problem List Patient Active Problem List   Diagnosis Date Noted  . Difficulty in walking(719.7) 02/15/2014  .  Postoperative stiffness of total knee replacement 02/15/2014  . Pain in joint, lower leg 02/15/2014  . Abnormality of gait 10/11/2013  . S/P arthroscopy of right knee 10/09/2013  . Radicular pain of right lower extremity 07/25/2013  . Knee instability 07/25/2013  . Right knee pain 07/25/2013  . Chest pain 04/25/2013  . Patellar tendonitis 09/07/2012  . OA (osteoarthritis) of knee 02/09/2012  . GERD (gastroesophageal reflux disease) 12/01/2011  . Dysphagia 12/01/2011  . RUQ pain 12/01/2011  . Fatty liver 12/01/2011  . Hematochezia 12/01/2011  . INTERMITTENT VERTIGO 09/12/2010  . KNEE, ARTHRITIS, DEGEN./OSTEO 04/03/2009  . CHEST PAIN, RECURRENT 03/07/2009  . OTHER DYSPHAGIA 03/07/2009  . PALPITATIONS 02/06/2009  . LIVER FUNCTION TESTS, ABNORMAL, HX OF 01/01/2009  . MORBID OBESITY 12/31/2008  . PUD 12/31/2008  . BACK PAIN 12/24/2008  . ANSERINE BURSITIS 12/24/2008  . BENIGN POSITIONAL VERTIGO 07/16/2008  . H N P-LUMBAR 04/02/2008  . SPINAL STENOSIS, CERVICAL 02/15/2008  . SPINAL STENOSIS, LUMBAR 02/15/2008  . Patellar tendinitis 10/19/2007  . TEAR LATERAL MENISCUS 06/13/2007  . OBESITY NOS 10/29/2006  . ALLERGIC RHINITIS, SEASONAL 10/29/2006  . HYPOTHYROIDISM 09/07/2006  . HYPERLIPIDEMIA 09/07/2006  . HYPERTENSION 09/07/2006  . GERD 09/07/2006  . DIVERTICULOSIS, COLON 09/07/2006  . OVERACTIVE BLADDER 09/07/2006  . FIBROCYSTIC BREAST DISEASE 09/07/2006  . OSTEOARTHRITIS 09/07/2006       Teena Irani, PTA/CLT 02/21/2014, 10:09 AM

## 2014-02-26 ENCOUNTER — Telehealth: Payer: Self-pay | Admitting: Orthopedic Surgery

## 2014-02-26 ENCOUNTER — Ambulatory Visit (HOSPITAL_COMMUNITY): Payer: PRIVATE HEALTH INSURANCE | Admitting: Physical Therapy

## 2014-02-26 NOTE — Telephone Encounter (Signed)
Patient called requesting a refill on Oxycodone. Patient phone is [phone]

## 2014-02-26 NOTE — Telephone Encounter (Signed)
Routing to Dr Harrison 

## 2014-02-27 ENCOUNTER — Ambulatory Visit (HOSPITAL_COMMUNITY): Payer: PRIVATE HEALTH INSURANCE | Admitting: Physical Therapy

## 2014-02-27 ENCOUNTER — Other Ambulatory Visit: Payer: Self-pay | Admitting: Orthopedic Surgery

## 2014-02-27 MED ORDER — OXYCODONE-ACETAMINOPHEN 5-325 MG PO TABS: 1.0000 | ORAL_TABLET | Freq: Four times a day (QID) | ORAL | Status: DC | PRN

## 2014-02-27 NOTE — Telephone Encounter (Signed)
Dr. Aline Brochure refilled and patient notified that prescription was ready to be picked up.

## 2014-02-28 ENCOUNTER — Ambulatory Visit (HOSPITAL_COMMUNITY)
Admission: RE | Admit: 2014-02-28 | Discharge: 2014-02-28 | Disposition: A | Discharge status: Home / Self Care | Payer: PRIVATE HEALTH INSURANCE | Source: Ambulatory Visit | Attending: Family Medicine | Admitting: Family Medicine

## 2014-02-28 DIAGNOSIS — IMO0001 Reserved for inherently not codable concepts without codable children: Secondary | ICD-10-CM | POA: Diagnosis not present

## 2014-02-28 NOTE — Progress Notes (Signed)
Physical Therapy Treatment Patient Details  Name: Sara [name] MRN: [id number] Date of Birth: [birth year]  Today's Date: 02/28/2014 Time: 4098-1191 PT Time Calculation (min): 49 min  Visit#: 4 of 12  Re-eval: 03/17/14 Authorization: [place] health care medicare  Authorization Visit#: 4 of 10  Charges:  therex 1436-1500 (24'), manual 1500-1515 (15'), ice 1515-1525 (10')  Subjective: Symptoms/Limitations Symptoms: Pt states she had a few rough days over the weekend with increased swelling.  States currently without pain. Pain Assessment Currently in Pain?: No/denies   Exercise/Treatments Stretches Active Hamstring Stretch: 10 seconds;Limitations Active Hamstring Stretch Limitations: 10 reps alternating with knee flexion on 14" step Knee: Self-Stretch to increase Flexion: 10 seconds;Limitations Knee: Self-Stretch Limitations: 10 reps alternating with hamstring stretch on 14" step Gastroc Stretch: 3 reps;30 seconds;Limitations Gastroc Stretch Limitations: slant board Aerobic Stationary Bike: nustep L3 x 8' Standing Knee Flexion: 10 reps;Limitations Knee Flexion Limitations: terminal with 8" step SLS: x3 max 29" Rt LE Supine Quad Sets: 10 reps Heel Slides: 10 reps Knee Extension: PROM Knee Flexion: PROM  Modalities Modalities: Cryotherapy Manual Therapy Manual Therapy: Edema management Edema Management: retro massage to Rt LE f/b icepack Cryotherapy Number Minutes Cryotherapy: 10 Minutes Cryotherapy Location: Knee Type of Cryotherapy: Ice pack  Physical Therapy Assessment and Plan PT Assessment:  Focus of session on reducing pain and stiffness today.  Added hamstring stretch alternating with knee flexion on 14" box with good results.  Noted improvement in knee flexion/extension with AAROM and PROM today. Clinical Impression Statement: PT Plan: Progress per POC with focus on reducing swelling and improving ROM.     Problem List Patient Active Problem List   Diagnosis Date Noted  . Difficulty in walking(719.7) 02/15/2014  . Postoperative stiffness of total knee replacement 02/15/2014  . Pain in joint, lower leg 02/15/2014  . Abnormality of gait 10/11/2013  . S/P arthroscopy of right knee 10/09/2013  . Radicular pain of right lower extremity 07/25/2013  . Knee instability 07/25/2013  . Right knee pain 07/25/2013  . Chest pain 04/25/2013  . Patellar tendonitis 09/07/2012  . OA (osteoarthritis) of knee 02/09/2012  . GERD (gastroesophageal reflux disease) 12/01/2011  . Dysphagia 12/01/2011  . RUQ pain 12/01/2011  . Fatty liver 12/01/2011  . Hematochezia 12/01/2011  . INTERMITTENT VERTIGO 09/12/2010  . KNEE, ARTHRITIS, DEGEN./OSTEO 04/03/2009  . CHEST PAIN, RECURRENT 03/07/2009  . OTHER DYSPHAGIA 03/07/2009  . PALPITATIONS 02/06/2009  . LIVER FUNCTION TESTS, ABNORMAL, HX OF 01/01/2009  . MORBID OBESITY 12/31/2008  . PUD 12/31/2008  . BACK PAIN 12/24/2008  . ANSERINE BURSITIS 12/24/2008  . BENIGN POSITIONAL VERTIGO 07/16/2008  . H N P-LUMBAR 04/02/2008  . SPINAL STENOSIS, CERVICAL 02/15/2008  . SPINAL STENOSIS, LUMBAR 02/15/2008  . Patellar tendinitis 10/19/2007  . TEAR LATERAL MENISCUS 06/13/2007  . OBESITY NOS 10/29/2006  . ALLERGIC RHINITIS, SEASONAL 10/29/2006  . HYPOTHYROIDISM 09/07/2006  . HYPERLIPIDEMIA 09/07/2006  . HYPERTENSION 09/07/2006  . GERD 09/07/2006  . DIVERTICULOSIS, COLON 09/07/2006  . OVERACTIVE BLADDER 09/07/2006  . FIBROCYSTIC BREAST DISEASE 09/07/2006  . OSTEOARTHRITIS 09/07/2006       Teena Irani, PTA/CLT 02/28/2014, 3:22 PM

## 2014-03-01 ENCOUNTER — Ambulatory Visit (HOSPITAL_COMMUNITY): Payer: PRIVATE HEALTH INSURANCE | Admitting: Physical Therapy

## 2014-03-02 ENCOUNTER — Ambulatory Visit (HOSPITAL_COMMUNITY)
Admission: RE | Admit: 2014-03-02 | Discharge: 2014-03-02 | Disposition: A | Discharge status: Home / Self Care | Payer: PRIVATE HEALTH INSURANCE | Source: Ambulatory Visit | Attending: Family Medicine | Admitting: Family Medicine

## 2014-03-02 DIAGNOSIS — M25669 Stiffness of unspecified knee, not elsewhere classified: Secondary | ICD-10-CM

## 2014-03-02 DIAGNOSIS — M25569 Pain in unspecified knee: Secondary | ICD-10-CM

## 2014-03-02 DIAGNOSIS — IMO0001 Reserved for inherently not codable concepts without codable children: Secondary | ICD-10-CM | POA: Diagnosis not present

## 2014-03-02 DIAGNOSIS — T8489XA Other specified complication of internal orthopedic prosthetic devices, implants and grafts, initial encounter: Secondary | ICD-10-CM

## 2014-03-02 DIAGNOSIS — Z96659 Presence of unspecified artificial knee joint: Secondary | ICD-10-CM

## 2014-03-02 DIAGNOSIS — R262 Difficulty in walking, not elsewhere classified: Secondary | ICD-10-CM

## 2014-03-02 NOTE — Progress Notes (Signed)
Physical Therapy Treatment Patient Details  Name: Sara [name] MRN: [id number] Date of Birth: 07/11/1946  Today's Date: 03/02/2014 Time: 1696-7893 PT Time Calculation (min): 47 min Charge:  There ex [id number]; manual [id number] Visit#: 5 of 12  Re-eval: 03/17/14    Authorization: [place] health care medicare  Authorization Visit#: 5 of 10   Subjective: Symptoms/Limitations Symptoms: Pt states her knee is bothering her not really pain but more discomfort.  Pt wants to get rid of the cane. Pain Assessment Pain Score: 1   Precautions/Restrictions     Exercise/Treatments      Stretches Active Hamstring Stretch: Limitations;2 reps Active Hamstring Stretch Limitations: 3 way stretch on 14" ox Knee: Self-Stretch to increase Flexion: 30 seconds;3 reps Gastroc Stretch: 3 reps;30 seconds Aerobic Stationary Bike: nustep L3 x 8'   Standing Knee Flexion: 10 reps Knee Flexion Limitations: terminal with 8" step Terminal Knee Extension: 10 reps Rocker Board: 2 minutes SLS with Vectors: 3x 10"     Supine Quad Sets: 10 reps Heel Slides: 10 reps Knee Extension: PROM   Manual Therapy Manual Therapy: Edema management Edema Management: manual techniques completed to reduce edema; scar massage and pattella mobs were also completed  Physical Therapy Assessment and Plan PT Assessment and Plan Clinical Impression Statement: Pt treatment facilitated by therapist concentrating on improving ROM as well as balance and decreasing discomfort.   PT Plan: focus on edma; ROM and balance     Problem List Patient Active Problem List   Diagnosis Date Noted  . Difficulty in walking(719.7) 02/15/2014  . Postoperative stiffness of total knee replacement 02/15/2014  . Pain in joint, lower leg 02/15/2014  . Abnormality of gait 10/11/2013  . S/P arthroscopy of right knee 10/09/2013  . Radicular pain of right lower extremity 07/25/2013  . Knee instability 07/25/2013  . Right knee pain  07/25/2013  . Chest pain 04/25/2013  . Patellar tendonitis 09/07/2012  . OA (osteoarthritis) of knee 02/09/2012  . GERD (gastroesophageal reflux disease) 12/01/2011  . Dysphagia 12/01/2011  . RUQ pain 12/01/2011  . Fatty liver 12/01/2011  . Hematochezia 12/01/2011  . INTERMITTENT VERTIGO 09/12/2010  . KNEE, ARTHRITIS, DEGEN./OSTEO 04/03/2009  . CHEST PAIN, RECURRENT 03/07/2009  . OTHER DYSPHAGIA 03/07/2009  . PALPITATIONS 02/06/2009  . LIVER FUNCTION TESTS, ABNORMAL, HX OF 01/01/2009  . MORBID OBESITY 12/31/2008  . PUD 12/31/2008  . BACK PAIN 12/24/2008  . ANSERINE BURSITIS 12/24/2008  . BENIGN POSITIONAL VERTIGO 07/16/2008  . H N P-LUMBAR 04/02/2008  . SPINAL STENOSIS, CERVICAL 02/15/2008  . SPINAL STENOSIS, LUMBAR 02/15/2008  . Patellar tendinitis 10/19/2007  . TEAR LATERAL MENISCUS 06/13/2007  . OBESITY NOS 10/29/2006  . ALLERGIC RHINITIS, SEASONAL 10/29/2006  . HYPOTHYROIDISM 09/07/2006  . HYPERLIPIDEMIA 09/07/2006  . HYPERTENSION 09/07/2006  . GERD 09/07/2006  . DIVERTICULOSIS, COLON 09/07/2006  . OVERACTIVE BLADDER 09/07/2006  . FIBROCYSTIC BREAST DISEASE 09/07/2006  . OSTEOARTHRITIS 09/07/2006       GP    Analysia Dungee,CINDY 03/02/2014, 5:32 PM

## 2014-03-05 ENCOUNTER — Ambulatory Visit (HOSPITAL_COMMUNITY): Payer: PRIVATE HEALTH INSURANCE | Admitting: Physical Therapy

## 2014-03-06 ENCOUNTER — Ambulatory Visit (HOSPITAL_COMMUNITY): Payer: PRIVATE HEALTH INSURANCE | Admitting: Physical Therapy

## 2014-03-07 ENCOUNTER — Ambulatory Visit (HOSPITAL_COMMUNITY)
Admission: RE | Admit: 2014-03-07 | Discharge: 2014-03-07 | Disposition: A | Discharge status: Home / Self Care | Payer: PRIVATE HEALTH INSURANCE | Source: Ambulatory Visit | Attending: Family Medicine | Admitting: Family Medicine

## 2014-03-07 DIAGNOSIS — IMO0001 Reserved for inherently not codable concepts without codable children: Secondary | ICD-10-CM | POA: Diagnosis not present

## 2014-03-07 NOTE — Progress Notes (Signed)
Physical Therapy Treatment Patient Details  Name: Sara [name] MRN: [id number] Date of Birth: 1946/02/14  Today's Date: 03/07/2014 Time: 0930-1015 PT Time Calculation (min): 45 min Visit#: 6 of 12  Re-eval: 03/17/14 Authorization: [place] [employer] care medicare  Authorization Visit#: 6 of 10  Charges;  therex 32', manual 10'  Subjective: Symptoms/Limitations Symptoms: PT states she has no pain and her swelling is getting better.  Minimal use of the cane now. Pain Assessment Currently in Pain?: No/denies  Precautions/Restrictions     Exercise/Treatments Stretches Active Hamstring Stretch: Limitations;3 reps;30 seconds Active Hamstring Stretch Limitations: 3 way stretch on 14" ox Knee: Self-Stretch to increase Flexion: 30 seconds;3 reps Gastroc Stretch: 3 reps;30 seconds Gastroc Stretch Limitations: slant board Aerobic Stationary Bike: nustep L3 hills #3, seat 7 x 8' Standing Knee Flexion: 20 reps Knee Flexion Limitations: terminal with 8" step Supine Quad Sets: 10 reps Heel Slides: 10 reps Knee Extension: PROM Knee Flexion: PROM   Manual Therapy Manual Therapy: Other (comment) Other Manual Therapy: manual techniques completed to reduce edema; scar massage and pattella mobs were also completed  Physical Therapy Assessment and Plan PT Assessment and Plan Clinical Impression Statement: Encouarged patient to diminish use of cane as her balance has improved enough without need for it.  Pt with noted increase in flexion today, supine AAROM to 110 degrees.  Pt with painful endfeel, however no hardness noted.  Continued massage to Rt knee to help decrease swelling and further increase ROM.   PT Plan: Continue to progress toward goals.  Per PT, focus on edma; ROM and balance    Problem List Patient Active Problem List   Diagnosis Date Noted  . Difficulty in walking(719.7) 02/15/2014  . Postoperative stiffness of total knee replacement 02/15/2014  . Pain in joint, lower  leg 02/15/2014  . Abnormality of gait 10/11/2013  . S/P arthroscopy of right knee 10/09/2013  . Radicular pain of right lower extremity 07/25/2013  . Knee instability 07/25/2013  . Right knee pain 07/25/2013  . Chest pain 04/25/2013  . Patellar tendonitis 09/07/2012  . OA (osteoarthritis) of knee 02/09/2012  . GERD (gastroesophageal reflux disease) 12/01/2011  . Dysphagia 12/01/2011  . RUQ pain 12/01/2011  . Fatty liver 12/01/2011  . Hematochezia 12/01/2011  . INTERMITTENT VERTIGO 09/12/2010  . KNEE, ARTHRITIS, DEGEN./OSTEO 04/03/2009  . CHEST PAIN, RECURRENT 03/07/2009  . OTHER DYSPHAGIA 03/07/2009  . PALPITATIONS 02/06/2009  . LIVER FUNCTION TESTS, ABNORMAL, HX OF 01/01/2009  . MORBID OBESITY 12/31/2008  . PUD 12/31/2008  . BACK PAIN 12/24/2008  . ANSERINE BURSITIS 12/24/2008  . BENIGN POSITIONAL VERTIGO 07/16/2008  . H N P-LUMBAR 04/02/2008  . SPINAL STENOSIS, CERVICAL 02/15/2008  . SPINAL STENOSIS, LUMBAR 02/15/2008  . Patellar tendinitis 10/19/2007  . TEAR LATERAL MENISCUS 06/13/2007  . OBESITY NOS 10/29/2006  . ALLERGIC RHINITIS, SEASONAL 10/29/2006  . HYPOTHYROIDISM 09/07/2006  . HYPERLIPIDEMIA 09/07/2006  . HYPERTENSION 09/07/2006  . GERD 09/07/2006  . DIVERTICULOSIS, COLON 09/07/2006  . OVERACTIVE BLADDER 09/07/2006  . FIBROCYSTIC BREAST DISEASE 09/07/2006  . OSTEOARTHRITIS 09/07/2006        Teena Irani, PTA/CLT 03/07/2014, 11:26 AM

## 2014-03-08 ENCOUNTER — Ambulatory Visit (HOSPITAL_COMMUNITY): Payer: PRIVATE HEALTH INSURANCE

## 2014-03-08 ENCOUNTER — Ambulatory Visit (HOSPITAL_COMMUNITY)
Admission: RE | Admit: 2014-03-08 | Discharge: 2014-03-08 | Disposition: A | Discharge status: Home / Self Care | Payer: PRIVATE HEALTH INSURANCE | Source: Ambulatory Visit | Attending: Family Medicine | Admitting: Family Medicine

## 2014-03-08 ENCOUNTER — Encounter: Payer: Self-pay | Admitting: Orthopedic Surgery

## 2014-03-08 ENCOUNTER — Ambulatory Visit (INDEPENDENT_AMBULATORY_CARE_PROVIDER_SITE_OTHER): Payer: PRIVATE HEALTH INSURANCE | Admitting: Orthopedic Surgery

## 2014-03-08 VITALS — BP 115/66 | Ht 66.0 in | Wt 213.0 lb

## 2014-03-08 DIAGNOSIS — IMO0001 Reserved for inherently not codable concepts without codable children: Secondary | ICD-10-CM | POA: Diagnosis not present

## 2014-03-08 DIAGNOSIS — Z96659 Presence of unspecified artificial knee joint: Secondary | ICD-10-CM

## 2014-03-08 DIAGNOSIS — T8489XA Other specified complication of internal orthopedic prosthetic devices, implants and grafts, initial encounter: Secondary | ICD-10-CM

## 2014-03-08 DIAGNOSIS — M25669 Stiffness of unspecified knee, not elsewhere classified: Secondary | ICD-10-CM

## 2014-03-08 DIAGNOSIS — R262 Difficulty in walking, not elsewhere classified: Secondary | ICD-10-CM

## 2014-03-08 DIAGNOSIS — M25569 Pain in unspecified knee: Secondary | ICD-10-CM

## 2014-03-08 NOTE — Progress Notes (Signed)
Physical Therapy Treatment Patient Details  Name: Sara [name] MRN: [id number] Date of Birth: 1946-02-04  Today's Date: 03/08/2014 Time: 3299-2426 PT Time Calculation (min): 44 min Charge:  There ex [id number]; manual [id number]. Visit#: 7 of 12  Re-eval: 03/17/14    Authorization: [employer] medicare   Authorization Visit#: 7 of 8   Subjective: Symptoms/Limitations Symptoms: Pt states she continues to improve.  Pain Assessment Currently in Pain?: No/denies    Exercise/Treatments Stretches Passive Hamstring Stretch: 60 seconds Gastroc Stretch: 3 reps Gastroc Stretch Limitations: slant board Aerobic Stationary Bike: nustep L3 hills #3, seat 7 x 8' Standing Knee Flexion: 10 reps Knee Flexion Limitations: terminal with 8" step Terminal Knee Extension: 10 reps SLS with Vectors: 3x 10"    Supine Quad Sets: 10 reps Heel Slides: 10 reps Knee Extension: PROM Knee Flexion: PROM    Manual Therapy Edema Management: manual techniques to decrease edema along with patellar mobilization  Physical Therapy Assessment and Plan PT Assessment and Plan Clinical Impression Statement: Pt is progressing well with treatment. Pt state MD states 110 is enough flexion and she does not have to push for 120.  Pt edema continues to decreae  with manual technques.   PT Plan: reassess next visit as pt may be ahead of schedule and be ready to discharge.     Goals    Problem List Patient Active Problem List   Diagnosis Date Noted  . Difficulty in walking(719.7) 02/15/2014  . Postoperative stiffness of total knee replacement 02/15/2014  . Pain in joint, lower leg 02/15/2014  . Abnormality of gait 10/11/2013  . S/P arthroscopy of right knee 10/09/2013  . Radicular pain of right lower extremity 07/25/2013  . Knee instability 07/25/2013  . Right knee pain 07/25/2013  . Chest pain 04/25/2013  . Patellar tendonitis 09/07/2012  . OA (osteoarthritis) of knee 02/09/2012  . GERD  (gastroesophageal reflux disease) 12/01/2011  . Dysphagia 12/01/2011  . RUQ pain 12/01/2011  . Fatty liver 12/01/2011  . Hematochezia 12/01/2011  . INTERMITTENT VERTIGO 09/12/2010  . KNEE, ARTHRITIS, DEGEN./OSTEO 04/03/2009  . CHEST PAIN, RECURRENT 03/07/2009  . OTHER DYSPHAGIA 03/07/2009  . PALPITATIONS 02/06/2009  . LIVER FUNCTION TESTS, ABNORMAL, HX OF 01/01/2009  . MORBID OBESITY 12/31/2008  . PUD 12/31/2008  . BACK PAIN 12/24/2008  . ANSERINE BURSITIS 12/24/2008  . BENIGN POSITIONAL VERTIGO 07/16/2008  . H N P-LUMBAR 04/02/2008  . SPINAL STENOSIS, CERVICAL 02/15/2008  . SPINAL STENOSIS, LUMBAR 02/15/2008  . Patellar tendinitis 10/19/2007  . TEAR LATERAL MENISCUS 06/13/2007  . OBESITY NOS 10/29/2006  . ALLERGIC RHINITIS, SEASONAL 10/29/2006  . HYPOTHYROIDISM 09/07/2006  . HYPERLIPIDEMIA 09/07/2006  . HYPERTENSION 09/07/2006  . GERD 09/07/2006  . DIVERTICULOSIS, COLON 09/07/2006  . OVERACTIVE BLADDER 09/07/2006  . FIBROCYSTIC BREAST DISEASE 09/07/2006  . OSTEOARTHRITIS 09/07/2006       GP    RUSSELL,CINDY 03/08/2014, 4:50 PM

## 2014-03-08 NOTE — Progress Notes (Signed)
Patient ID: Sara [name], female   DOB: 1946-01-17, 68 y.o.   MRN: [id number]  Encounter Diagnosis  Name Primary?  . S/P knee replacement Yes   BP 115/66  Ht 5\' 6"  (1.676 m)  Wt 213 lb (96.616 kg)  BMI 34.40 kg/m2  Chief Complaint  Patient presents with  . Follow-up    Post op 2 Tight TKA DOS 01/24/14    6 week postop followup total knee replacement right doing well no problems. Her knee flexion is approximately 112 she was concerned that therapist was trying to get 120 which is functioning well has minimal discomfort and is doing great  I would caution the therapist not to be too aggressive with the knee flexion she has a left knee flexion to do most activities of daily living at this time and just needs strengthening and conditioning at this point  Recommend followup in 6 weeks.

## 2014-03-08 NOTE — Patient Instructions (Signed)
You may drive and dance now

## 2014-03-09 ENCOUNTER — Ambulatory Visit (HOSPITAL_COMMUNITY)
Admission: RE | Admit: 2014-03-09 | Discharge: 2014-03-09 | Disposition: A | Discharge status: Home / Self Care | Payer: PRIVATE HEALTH INSURANCE | Source: Ambulatory Visit | Attending: Family Medicine | Admitting: Family Medicine

## 2014-03-09 DIAGNOSIS — R262 Difficulty in walking, not elsewhere classified: Secondary | ICD-10-CM

## 2014-03-09 DIAGNOSIS — M25561 Pain in right knee: Secondary | ICD-10-CM

## 2014-03-09 DIAGNOSIS — IMO0001 Reserved for inherently not codable concepts without codable children: Secondary | ICD-10-CM | POA: Diagnosis not present

## 2014-03-09 NOTE — Progress Notes (Signed)
Physical Therapy Treatment Patient Details  Name: Sara [name] MRN: [id number] Date of Birth: [birth year]  Today's Date: 03/09/2014 Time: 7262-0355 PT Time Calculation (min): 50 min Charge: manual [phone]; manual [phone]  Visit#: 8 of 12  Re-eval: 03/17/14    Authorization: [place] health care medicare    Authorization Visit#: 8 of 10   Subjective: Symptoms/Limitations Symptoms: Pt states that she was sore after yesterday treatment.     Exercise/Treatments Mobility/Balance        Stretches Active Hamstring Stretch: 3 reps;30 seconds Gastroc Stretch: 3 reps;30 seconds Gastroc Stretch Limitations: slant board Aerobic Stationary Bike: nustep L3 hills #3, seat 7 x 8' Standing Gait Training: to decrease limp Supine Quad Sets: 15 reps Terminal Knee Extension: 15 reps Knee Extension: PROM   Prone  Hamstring Curl: 10 reps   Manual Therapy Manual Therapy: Edema management Edema Management: decongestive techniques used to decrease edema; patellar mobilization   Physical Therapy Assessment and Plan PT Assessment and Plan Clinical Impression Statement: Pt is not ready for discharge continues to have a limp when ambulationg but with therapist instruction pt is able to ambulate without a limp.  Pt has decreased mobiliy of patell will require continued mobilization. PT Plan: continue with emphasis on gait, extension and edema control.  Pt is able to ice at home does not need to ice in department spend time with manual.        Problem List Patient Active Problem List   Diagnosis Date Noted  . Difficulty in walking(719.7) 02/15/2014  . Postoperative stiffness of total knee replacement 02/15/2014  . Pain in joint, lower leg 02/15/2014  . Abnormality of gait 10/11/2013  . S/P arthroscopy of right knee 10/09/2013  . Radicular pain of right lower extremity 07/25/2013  . Knee instability 07/25/2013  . Right knee pain 07/25/2013  . Chest pain 04/25/2013  . Patellar  tendonitis 09/07/2012  . OA (osteoarthritis) of knee 02/09/2012  . GERD (gastroesophageal reflux disease) 12/01/2011  . Dysphagia 12/01/2011  . RUQ pain 12/01/2011  . Fatty liver 12/01/2011  . Hematochezia 12/01/2011  . INTERMITTENT VERTIGO 09/12/2010  . KNEE, ARTHRITIS, DEGEN./OSTEO 04/03/2009  . CHEST PAIN, RECURRENT 03/07/2009  . OTHER DYSPHAGIA 03/07/2009  . PALPITATIONS 02/06/2009  . LIVER FUNCTION TESTS, ABNORMAL, HX OF 01/01/2009  . MORBID OBESITY 12/31/2008  . PUD 12/31/2008  . BACK PAIN 12/24/2008  . ANSERINE BURSITIS 12/24/2008  . BENIGN POSITIONAL VERTIGO 07/16/2008  . H N P-LUMBAR 04/02/2008  . SPINAL STENOSIS, CERVICAL 02/15/2008  . SPINAL STENOSIS, LUMBAR 02/15/2008  . Patellar tendinitis 10/19/2007  . TEAR LATERAL MENISCUS 06/13/2007  . OBESITY NOS 10/29/2006  . ALLERGIC RHINITIS, SEASONAL 10/29/2006  . HYPOTHYROIDISM 09/07/2006  . HYPERLIPIDEMIA 09/07/2006  . HYPERTENSION 09/07/2006  . GERD 09/07/2006  . DIVERTICULOSIS, COLON 09/07/2006  . OVERACTIVE BLADDER 09/07/2006  . FIBROCYSTIC BREAST DISEASE 09/07/2006  . OSTEOARTHRITIS 09/07/2006       GP    RUSSELL,CINDY 03/09/2014, 1:22 PM

## 2014-03-12 ENCOUNTER — Ambulatory Visit (HOSPITAL_COMMUNITY)
Admission: RE | Admit: 2014-03-12 | Discharge: 2014-03-12 | Disposition: A | Discharge status: Home / Self Care | Payer: PRIVATE HEALTH INSURANCE | Source: Ambulatory Visit | Attending: Family Medicine | Admitting: Family Medicine

## 2014-03-12 DIAGNOSIS — IMO0001 Reserved for inherently not codable concepts without codable children: Secondary | ICD-10-CM | POA: Diagnosis not present

## 2014-03-12 DIAGNOSIS — M25561 Pain in right knee: Secondary | ICD-10-CM

## 2014-03-12 DIAGNOSIS — R262 Difficulty in walking, not elsewhere classified: Secondary | ICD-10-CM

## 2014-03-12 NOTE — Progress Notes (Signed)
Physical Therapy Treatment Patient Details  Name: Sara [name] MRN: [id number] Date of Birth: Jul 05, 1946  Today's Date: 03/12/2014 Time: 8676-1950 PT Time Calculation (min): 45 min Charge:  There ex [phone]; manual [phone] Visit#: 9 of 12  Re-eval: 03/17/14    Authorization: [employer] medicare     Authorization Visit#: 9 of 10   Subjective: Symptoms/Limitations Symptoms: Less pain but still has swelling.  Pt states that she is icing twice a day.  Pt danced yesterday with no increase of pain or sweilling Pain Assessment Pain Score: 1  Pain Location: Knee Pain Orientation: Right Pain Type: Acute pain   Exercise/Treatments      Stretches Passive Hamstring Stretch: 60 seconds Gastroc Stretch: 3 reps;30 seconds Gastroc Stretch Limitations: slant board Aerobic nustep L3 hills #3, seat 7 x 8'   Standing Terminal Knee Extension: 10 reps Rocker Board: 2 minutes Gait Training: Pt demonstrated good heel toe gait pattern    Supine Quad Sets: 15 reps Heel Slides: 5 reps Terminal Knee Extension: 15 reps Knee Extension: PROM   Manual Therapy Manual Therapy: Edema management Edema Management: decongestive techniques used to decrease edema; patellar mobilization as well as scar massage   Physical Therapy Assessment and Plan PT Assessment and Plan Clinical Impression Statement: Pt encouraged to ice at least four times a day to decrease edema.  Begin vector stances next treatment         Problem List Patient Active Problem List   Diagnosis Date Noted  . Difficulty in walking(719.7) 02/15/2014  . Postoperative stiffness of total knee replacement 02/15/2014  . Pain in joint, lower leg 02/15/2014  . Abnormality of gait 10/11/2013  . S/P arthroscopy of right knee 10/09/2013  . Radicular pain of right lower extremity 07/25/2013  . Knee instability 07/25/2013  . Right knee pain 07/25/2013  . Chest pain 04/25/2013  . Patellar tendonitis 09/07/2012  . OA  (osteoarthritis) of knee 02/09/2012  . GERD (gastroesophageal reflux disease) 12/01/2011  . Dysphagia 12/01/2011  . RUQ pain 12/01/2011  . Fatty liver 12/01/2011  . Hematochezia 12/01/2011  . INTERMITTENT VERTIGO 09/12/2010  . KNEE, ARTHRITIS, DEGEN./OSTEO 04/03/2009  . CHEST PAIN, RECURRENT 03/07/2009  . OTHER DYSPHAGIA 03/07/2009  . PALPITATIONS 02/06/2009  . LIVER FUNCTION TESTS, ABNORMAL, HX OF 01/01/2009  . MORBID OBESITY 12/31/2008  . PUD 12/31/2008  . BACK PAIN 12/24/2008  . ANSERINE BURSITIS 12/24/2008  . BENIGN POSITIONAL VERTIGO 07/16/2008  . H N P-LUMBAR 04/02/2008  . SPINAL STENOSIS, CERVICAL 02/15/2008  . SPINAL STENOSIS, LUMBAR 02/15/2008  . Patellar tendinitis 10/19/2007  . TEAR LATERAL MENISCUS 06/13/2007  . OBESITY NOS 10/29/2006  . ALLERGIC RHINITIS, SEASONAL 10/29/2006  . HYPOTHYROIDISM 09/07/2006  . HYPERLIPIDEMIA 09/07/2006  . HYPERTENSION 09/07/2006  . GERD 09/07/2006  . DIVERTICULOSIS, COLON 09/07/2006  . OVERACTIVE BLADDER 09/07/2006  . FIBROCYSTIC BREAST DISEASE 09/07/2006  . OSTEOARTHRITIS 09/07/2006       GP    RUSSELL,CINDY 03/12/2014, 10:35 AM

## 2014-03-13 ENCOUNTER — Ambulatory Visit (HOSPITAL_COMMUNITY): Payer: PRIVATE HEALTH INSURANCE | Admitting: Physical Therapy

## 2014-03-14 ENCOUNTER — Ambulatory Visit (HOSPITAL_COMMUNITY)
Admission: RE | Admit: 2014-03-14 | Discharge: 2014-03-14 | Disposition: A | Discharge status: Home / Self Care | Payer: PRIVATE HEALTH INSURANCE | Source: Ambulatory Visit | Attending: Family Medicine | Admitting: Family Medicine

## 2014-03-14 DIAGNOSIS — R262 Difficulty in walking, not elsewhere classified: Secondary | ICD-10-CM

## 2014-03-14 DIAGNOSIS — IMO0001 Reserved for inherently not codable concepts without codable children: Secondary | ICD-10-CM | POA: Diagnosis not present

## 2014-03-14 NOTE — Progress Notes (Signed)
Physical Therapy Treatment Patient Details  Name: Sara [name] MRN: [id number] Date of Birth: 06/16/1946  Today's Date: 03/14/2014 Time: 0932-1025 PT Time Calculation (min): 53 min Charge: TE [id number], Manual 1005-1015, Ice 1015-1025  Visit#: 10 of 12  Re-eval: 03/17/14 Assessment Diagnosis: Rt TKR Next MD Visit: 6 weeks Prior Therapy: HH  Authorization: [employer] medicare  Authorization Time Period:    Authorization Visit#: 10 of 20   Subjective: Symptoms/Limitations Symptoms: Pain free today just feels really tight today.  Has increased ice to 4x a day and noted decreased edema Pain Assessment Currently in Pain?: No/denies  Objective:  Exercise/Treatments Stretches Active Hamstring Stretch: 3 reps;20 seconds;Limitations Active Hamstring Stretch Limitations: 3 way stretch on 14" box Knee: Self-Stretch to increase Flexion: Limitations Knee: Self-Stretch Limitations: 10 reps x 5" holds Gastroc Stretch: 3 reps;30 seconds Gastroc Stretch Limitations: slant board Aerobic Stationary Bike: 8' seat 8 for ROM Standing Terminal Knee Extension: 10 reps Rocker Board: 2 minutes SLS with Vectors: 3x 10"  Supine Quad Sets: 15 reps Heel Slides: 5 reps Terminal Knee Extension: 15 reps Knee Extension: PROM   Modalities Modalities: Cryotherapy (per pt. request) Manual Therapy Manual Therapy: Edema management Edema Management: Retro massage with LE elevated Cryotherapy Number Minutes Cryotherapy: 10 Minutes Cryotherapy Location: Knee Type of Cryotherapy: Ice pack  Physical Therapy Assessment and Plan PT Assessment and Plan Clinical Impression Statement: Resumed vector stance for stabilty, balance and overall strengthening, pt required 1 finger HHA for balance instabilty.  Min tactille and verbal cueing to improve distal quadriceps contraction activation.  Manual techniques complete end of sessoin for edema control.  FOTO complete for gcode today, pt with  increased perceived functional abilities with vast increase in score. PT Plan: continue with emphasis on gait, extension and edema control.      Goals PT Short Term Goals PT Short Term Goal 1: Pt Pain level to be no greater than a 3 to allow pt to only be waking 1-2 times a night PT Short Term Goal 1 - Progress: Progressing toward goal PT Short Term Goal 2: ROM to be to 120 to allow pt to squat to pick up items off the floor PT Short Term Goal 2 - Progress: Progressing toward goal PT Short Term Goal 3: Pt to be able to walk for 20 minutes without increased pain PT Short Term Goal 3 - Progress: Progressing toward goal PT Short Term Goal 4: Pt to be ambulating without her cane inside. PT Short Term Goal 4 - Progress: Met  Problem List Patient Active Problem List   Diagnosis Date Noted  . Difficulty in walking(719.7) 02/15/2014  . Postoperative stiffness of total knee replacement 02/15/2014  . Pain in joint, lower leg 02/15/2014  . Abnormality of gait 10/11/2013  . S/P arthroscopy of right knee 10/09/2013  . Radicular pain of right lower extremity 07/25/2013  . Knee instability 07/25/2013  . Right knee pain 07/25/2013  . Chest pain 04/25/2013  . Patellar tendonitis 09/07/2012  . OA (osteoarthritis) of knee 02/09/2012  . GERD (gastroesophageal reflux disease) 12/01/2011  . Dysphagia 12/01/2011  . RUQ pain 12/01/2011  . Fatty liver 12/01/2011  . Hematochezia 12/01/2011  . INTERMITTENT VERTIGO 09/12/2010  . KNEE, ARTHRITIS, DEGEN./OSTEO 04/03/2009  . CHEST PAIN, RECURRENT 03/07/2009  . OTHER DYSPHAGIA 03/07/2009  . PALPITATIONS 02/06/2009  . LIVER FUNCTION TESTS, ABNORMAL, HX OF 01/01/2009  . MORBID OBESITY 12/31/2008  . PUD 12/31/2008  . BACK PAIN 12/24/2008  . ANSERINE BURSITIS 12/24/2008  . BENIGN  POSITIONAL VERTIGO 07/16/2008  . H N P-LUMBAR 04/02/2008  . SPINAL STENOSIS, CERVICAL 02/15/2008  . SPINAL STENOSIS, LUMBAR 02/15/2008  . Patellar tendinitis 10/19/2007  . TEAR  LATERAL MENISCUS 06/13/2007  . OBESITY NOS 10/29/2006  . ALLERGIC RHINITIS, SEASONAL 10/29/2006  . HYPOTHYROIDISM 09/07/2006  . HYPERLIPIDEMIA 09/07/2006  . HYPERTENSION 09/07/2006  . GERD 09/07/2006  . DIVERTICULOSIS, COLON 09/07/2006  . OVERACTIVE BLADDER 09/07/2006  . FIBROCYSTIC BREAST DISEASE 09/07/2006  . OSTEOARTHRITIS 09/07/2006    PT - End of Session Activity Tolerance: Patient tolerated treatment well General Behavior During Therapy: WFL for tasks assessed/performed  GP Functional Limitation: Mobility: Walking and moving around Mobility: Walking and Moving Around Current Status (F8421): At least 20 percent but less than 40 percent impaired, limited or restricted Mobility: Walking and Moving Around Goal Status [id number]): At least 40 percent but less than 60 percent impaired, limited or restricted  Aldona Lento 03/14/2014, 12:16 PM

## 2014-03-15 ENCOUNTER — Ambulatory Visit (HOSPITAL_COMMUNITY): Payer: PRIVATE HEALTH INSURANCE | Admitting: Physical Therapy

## 2014-03-16 ENCOUNTER — Ambulatory Visit (HOSPITAL_COMMUNITY)
Admission: RE | Admit: 2014-03-16 | Discharge: 2014-03-16 | Disposition: A | Discharge status: Home / Self Care | Payer: PRIVATE HEALTH INSURANCE | Source: Ambulatory Visit | Attending: Family Medicine | Admitting: Family Medicine

## 2014-03-16 DIAGNOSIS — IMO0001 Reserved for inherently not codable concepts without codable children: Secondary | ICD-10-CM | POA: Diagnosis not present

## 2014-03-16 NOTE — Evaluation (Signed)
Physical Therapy Evaluation/Discharge   Patient Details  Name: Sara [name] MRN: [id number] Date of Birth: 20-Apr-1946  Today's Date: 03/16/2014 Time: 1020-1050 PT Time Calculation (min): 30 min Charge:  Mm test 1040-1050; there ex 1020-1040             Visit#: 11 of 12  Re-eval:   Assessment Diagnosis: Rt TKR  Authorization: [place] health care medicare    Past Medical History:  Past Medical History  Diagnosis Date  . Thyroid disease   . Chronic back pain   . Reflux   . Unspecified arthropathy, shoulder region   . Pure hypercholesterolemia     takes Pravastin daily  . Allergic rhinitis due to pollen   . Cervicalgia   . Diverticulosis of colon (without mention of hemorrhage)   . Morbid obesity   . Palpitations   . Peptic ulcer, unspecified site, unspecified as acute or chronic, without mention of hemorrhage, perforation, or obstruction   . Peripheral vertigo, unspecified     takes Meclizine prn  . Reflux esophagitis   . Spinal stenosis in cervical region   . 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 Personal history of tobacco use, presenting hazards to health   . Insomnia, unspecified   . Lumbago   . Right leg pain   . Shortness of breath   . H/O hiatal hernia   . HTN (hypertension)     takes Metoprolol and Lisinopril daily  . Hypertension   . Sleep apnea     slight but doesn't require a CPAP  . History of bronchitis     > 70yr[age]  . Seasonal allergies     uses Flonase daily  . Headache(784.0)     denies migraines since the 80's but has occ and takes Butalbital prn  . Cervical spondylosis without myelopathy     all over  . Degeneration of cervical intervertebral disc   . Primary localized osteoarthrosis, lower leg   . GERD (gastroesophageal reflux disease)     takes Omeprazole daily  . Constipation   . Diverticulosis   . History of bladder infections   . Urinary incontinence      occasionally  . Other postablative hypothyroidism     takes SYnthroid daily  . Insomnia     takes Ativan nigtly   Past Surgical History:  Past Surgical History  Procedure Laterality Date  . Knee surgery      arthroscopy-- R  . Tonsillectomy    . Appendectomy    . Colonoscopy  Sept 2008    RMR: normal rectum, left-sided diverticula, repeat in 2018  . Abdominal hysterectomy    . Upper gastrointestinal endoscopy    . Breast lumpectomy      left   . Eye surgery      cataracts removed- bilateral, /w IOL  . Lumpectomy on pelvis    . Diagnostic laparoscopy    . Tubal ligation    . Esophagogastroduodenoscopy    . Anterior lat lumbar fusion  05/13/2012    Procedure: ANTERIOR LATERAL LUMBAR FUSION 2 LEVELS;  Surgeon: RFaythe Ghee MD;  [facility];  Service: Neurosurgery;  Laterality: Left;  Left Lumbar Three-four,Lumbar four-five Extreme Lumbar Interbody Fusion with Percutaneous Pedicle Screws  . Spinal fusion    . Knee arthroscopy with lateral menisectomy Right 10/06/2013    Procedure: KNEE ARTHROSCOPY WITH LATERAL AND MEDIAL MENISECTOMY;  Surgeon: STim Lair  Aline Brochure, MD;  Location: [facility];  Service: Orthopedics;  Laterality: Right;  END @ 1234  . Foreign body removal Right 10/06/2013    Procedure: REMOVAL FOREIGN BODY EXTREMITY;  Surgeon: Carole Civil, MD;  Location: [facility];  Service: Orthopedics;  Laterality: Right;  . Injection knee Left 10/06/2013    Procedure: KNEE INJECTION;  Surgeon: Carole Civil, MD;  Location: [facility];  Service: Orthopedics;  Laterality: Left;  . Total knee arthroplasty Right 01/24/2014    Procedure: TOTAL KNEE ARTHROPLASTY;  Surgeon: Carole Civil, MD;  Location: [facility];  Service: Orthopedics;  Laterality: Right;    Subjective  Pt states she is able to do all her normal activity.  Sensation/Coordination/Flexibility/Functional Tests Functional Tests Functional Tests: foto 83 was 39  Assessment RLE AROM (degrees) Right Knee  Extension: 4 (was 12) Right Knee Flexion: 118 (was 103) RLE Strength Right Hip Flexion: 5/5 Right Hip Extension: 4/5 (was 4/5  Lt is  4/5 as well) Right Hip ABduction: 5/5 Right Knee Flexion: 5/5 Right Knee Extension: 5/5 (4/5 ) Right Ankle Dorsiflexion: 5/5  Exercise/Treatments   Standing Knee Flexion: 10 reps Rocker Board: 2 minutes Supine Quad Sets: 10 reps Heel Slides: 5 reps Terminal Knee Extension: 10 reps Prone  Hamstring Curl: 10 reps Hip Extension: 10 reps    Physical Therapy Assessment and Plan PT Assessment and Plan Clinical Impression Statement: Pt has met all goals and is ready to be discharged. PT Plan: Discharge patient    Goals PT Short Term Goals Time to Complete Short Term Goals: 2 weeks PT Short Term Goal 1: Pt Pain level to be no greater than a 3 to allow pt to only be waking 1-2 times a night PT Short Term Goal 1 - Progress: Met PT Short Term Goal 2: ROM to be to 120 to allow pt to squat to pick up items off the floor PT Short Term Goal 2 - Progress: Met PT Short Term Goal 3: Pt to be able to walk for 20 minutes without increased pain PT Short Term Goal 3 - Progress: Met PT Short Term Goal 4: Pt to be ambulating without her cane inside. PT Short Term Goal 4 - Progress: Met PT Long Term Goals Time to Complete Long Term Goals: 4 weeks PT Long Term Goal 1: I in advance HEP PT Long Term Goal 2: Pain no greater than a 1/10 to allow pt to sleep through the night PT Long Term Goal 2 - Progress: Met Long Term Goal 3: ROM less than 5 to allow normalized gt Long Term Goal 3 Progress: Met Long Term Goal 4: ambulating inside and outside without an assistive device Long Term Goal 4 Progress: Met Long Term Goal 5 Progress: Progressing toward goal  Problem List Patient Active Problem List   Diagnosis Date Noted  . Difficulty in walking(719.7) 02/15/2014  . Postoperative stiffness of total knee replacement 02/15/2014  . Pain in joint, lower leg 02/15/2014   . Abnormality of gait 10/11/2013  . S/P arthroscopy of right knee 10/09/2013  . Radicular pain of right lower extremity 07/25/2013  . Knee instability 07/25/2013  . Right knee pain 07/25/2013  . Chest pain 04/25/2013  . Patellar tendonitis 09/07/2012  . OA (osteoarthritis) of knee 02/09/2012  . GERD (gastroesophageal reflux disease) 12/01/2011  . Dysphagia 12/01/2011  . RUQ pain 12/01/2011  . Fatty liver 12/01/2011  . Hematochezia 12/01/2011  . INTERMITTENT VERTIGO 09/12/2010  . KNEE, ARTHRITIS, DEGEN./OSTEO 04/03/2009  . CHEST PAIN, RECURRENT  03/07/2009  . OTHER DYSPHAGIA 03/07/2009  . PALPITATIONS 02/06/2009  . LIVER FUNCTION TESTS, ABNORMAL, HX OF 01/01/2009  . MORBID OBESITY 12/31/2008  . PUD 12/31/2008  . BACK PAIN 12/24/2008  . ANSERINE BURSITIS 12/24/2008  . BENIGN POSITIONAL VERTIGO 07/16/2008  . H N P-LUMBAR 04/02/2008  . SPINAL STENOSIS, CERVICAL 02/15/2008  . SPINAL STENOSIS, LUMBAR 02/15/2008  . Patellar tendinitis 10/19/2007  . TEAR LATERAL MENISCUS 06/13/2007  . OBESITY NOS 10/29/2006  . ALLERGIC RHINITIS, SEASONAL 10/29/2006  . HYPOTHYROIDISM 09/07/2006  . HYPERLIPIDEMIA 09/07/2006  . HYPERTENSION 09/07/2006  . GERD 09/07/2006  . DIVERTICULOSIS, COLON 09/07/2006  . OVERACTIVE BLADDER 09/07/2006  . FIBROCYSTIC BREAST DISEASE 09/07/2006  . OSTEOARTHRITIS 09/07/2006       GP Functional Assessment Tool Used: foto Functional Limitation: Mobility: Walking and moving around Mobility: Walking and Moving Around Goal Status [phone]): At least 40 percent but less than 60 percent impaired, limited or restricted Mobility: Walking and Moving Around Discharge Status [phone]): At least 1 percent but less than 20 percent impaired, limited or restricted  Maryclaire Stoecker,CINDY 03/16/2014, 10:59 AM  Physician Documentation Your signature is required to indicate approval of the treatment plan as stated above.  Please sign and either send electronically or make a copy of this  report for your files and return this physician signed original.   Please mark one 1.__approve of plan  2. ___approve of plan with the following conditions.   ______________________________                                                          _____________________ Physician Signature                                                                                                             Date

## 2014-03-16 NOTE — Evaluation (Deleted)
Physical Therapy Evaluation  Patient Details  Name: Sara [name] MRN: [id number] Date of Birth: 02/16/1946  Today's Date: 03/16/2014 Time: 1020-1050 PT Time Calculation (min): 30 min Charge:  Mm test 1020-1030; there ex 1031-1050              Visit#: 11 of 12  Re-eval:   Assessment Diagnosis: Rt TKR  Authorization: [place] health care medicare    Past Medical History:  Past Medical History  Diagnosis Date  . Thyroid disease   . Chronic back pain   . Reflux   . Unspecified arthropathy, shoulder region   . Pure hypercholesterolemia     takes Pravastin daily  . Allergic rhinitis due to pollen   . Cervicalgia   . Diverticulosis of colon (without mention of hemorrhage)   . Morbid obesity   . Palpitations   . Peptic ulcer, unspecified site, unspecified as acute or chronic, without mention of hemorrhage, perforation, or obstruction   . Peripheral vertigo, unspecified     takes Meclizine prn  . Reflux esophagitis   . Spinal stenosis in cervical region   . 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 Personal history of tobacco use, presenting hazards to health   . Insomnia, unspecified   . Lumbago   . Right leg pain   . Shortness of breath   . H/O hiatal hernia   . HTN (hypertension)     takes Metoprolol and Lisinopril daily  . Hypertension   . Sleep apnea     slight but doesn't require a CPAP  . History of bronchitis     > 38yr ago  . Seasonal allergies     uses Flonase daily  . Headache(784.0)     denies migraines since the 80's but has occ and takes Butalbital prn  . Cervical spondylosis without myelopathy     all over  . Degeneration of cervical intervertebral disc   . Primary localized osteoarthrosis, lower leg   . GERD (gastroesophageal reflux disease)     takes Omeprazole daily  . Constipation   . Diverticulosis   . History of bladder infections   . Urinary incontinence     occasionally  .  Other postablative hypothyroidism     takes SYnthroid daily  . Insomnia     takes Ativan nigtly   Past Surgical History:  Past Surgical History  Procedure Laterality Date  . Knee surgery      arthroscopy-- R  . Tonsillectomy    . Appendectomy    . Colonoscopy  Sept 2008    RMR: normal rectum, left-sided diverticula, repeat in 2018  . Abdominal hysterectomy    . Upper gastrointestinal endoscopy    . Breast lumpectomy      left   . Eye surgery      cataracts removed- bilateral, /w IOL  . Lumpectomy on pelvis    . Diagnostic laparoscopy    . Tubal ligation    . Esophagogastroduodenoscopy    . Anterior lat lumbar fusion  05/13/2012    Procedure: ANTERIOR LATERAL LUMBAR FUSION 2 LEVELS;  Surgeon: Faythe Ghee, MD;  Location: [facility];  Service: Neurosurgery;  Laterality: Left;  Left Lumbar Three-four,Lumbar four-five Extreme Lumbar Interbody Fusion with Percutaneous Pedicle Screws  . Spinal fusion    . Knee arthroscopy with lateral menisectomy Right 10/06/2013    Procedure: KNEE ARTHROSCOPY WITH LATERAL AND MEDIAL MENISECTOMY;  Surgeon: Tim Lair  Aline Brochure, MD;  Location: [facility];  Service: Orthopedics;  Laterality: Right;  END @ 1234  . Foreign body removal Right 10/06/2013    Procedure: REMOVAL FOREIGN BODY EXTREMITY;  Surgeon: Carole Civil, MD;  Location: [facility];  Service: Orthopedics;  Laterality: Right;  . Injection knee Left 10/06/2013    Procedure: KNEE INJECTION;  Surgeon: Carole Civil, MD;  Location: [facility];  Service: Orthopedics;  Laterality: Left;  . Total knee arthroplasty Right 01/24/2014    Procedure: TOTAL KNEE ARTHROPLASTY;  Surgeon: Carole Civil, MD;  Location: [facility];  Service: Orthopedics;  Laterality: Right;    Subjective Symptoms/Limitations Symptoms: Pain free today just feels really tight today.  Has increased ice to 4x a day and noted decreased edema How long can you sit comfortably?: 20 minutes How long can you stand comfortably?: 5-10  minutes How long can you walk comfortably?: ambulating with a cane for 10 minutes  Pain Assessment Currently in Pain?: No/denies  Sensation/Coordination/Flexibility/Functional Tests Functional Tests Functional Tests: foto 83 was 39  Assessment RLE AROM (degrees) Right Knee Extension: 4 (was 12) Right Knee Flexion: 118 (was 103) RLE Strength Right Hip Flexion: 5/5 Right Hip Extension: 4/5 (was 4/5  Lt is  4/5 as well) Right Hip ABduction: 5/5 Right Knee Flexion: 5/5 Right Knee Extension: 5/5 (4/5 ) Right Ankle Dorsiflexion: 5/5  Exercise/Treatments    Standing  SLS with Vectors: 3x 10"    Supine Quad Sets: 15 reps Heel Slides: 5 reps Terminal Knee Extension: 15 reps Knee Extension: PROM    Physical Therapy Assessment and Plan PT Assessment and Plan Clinical Impression Statement: Pt has met all goals and is ready to be discharged PT Plan: Discharge patient    Goals PT Short Term Goals Time to Complete Short Term Goals: 2 weeks PT Short Term Goal 1: Pt Pain level to be no greater than a 3 to allow pt to only be waking 1-2 times a night PT Short Term Goal 1 - Progress: Met PT Short Term Goal 2: ROM to be to 120 to allow pt to squat to pick up items off the floor PT Short Term Goal 2 - Progress: Met PT Short Term Goal 3: Pt to be able to walk for 20 minutes without increased pain PT Short Term Goal 3 - Progress: Met PT Short Term Goal 4: Pt to be ambulating without her cane inside. PT Short Term Goal 4 - Progress: Met PT Long Term Goals Time to Complete Long Term Goals: 4 weeks PT Long Term Goal 1: I in advance HEP PT Long Term Goal 1 - Progress: Met PT Long Term Goal 2: Pain no greater than a 1/10 to allow pt to sleep through the night PT Long Term Goal 2 - Progress: Met Long Term Goal 3: ROM less than 5 to allow normalized gt Long Term Goal 3 Progress: Met Long Term Goal 4: ambulating inside and outside without an assistive device Long Term Goal 4 Progress:  Met PT Long Term Goal 5: Pt to be ambulating at least a mile three times a week for better health habits.  Long Term Goal 5 Progress: Met Additional PT Long Term Goals?: Yes PT Long Term Goal 6: able to go up and down steps in a reciprocal manner. Long Term Goal 6 Progress: Met  Problem List Patient Active Problem List   Diagnosis Date Noted  . Difficulty in walking(719.7) 02/15/2014  . Postoperative stiffness of total knee replacement 02/15/2014  . Pain  in joint, lower leg 02/15/2014  . Abnormality of gait 10/11/2013  . S/P arthroscopy of right knee 10/09/2013  . Radicular pain of right lower extremity 07/25/2013  . Knee instability 07/25/2013  . Right knee pain 07/25/2013  . Chest pain 04/25/2013  . Patellar tendonitis 09/07/2012  . OA (osteoarthritis) of knee 02/09/2012  . GERD (gastroesophageal reflux disease) 12/01/2011  . Dysphagia 12/01/2011  . RUQ pain 12/01/2011  . Fatty liver 12/01/2011  . Hematochezia 12/01/2011  . INTERMITTENT VERTIGO 09/12/2010  . KNEE, ARTHRITIS, DEGEN./OSTEO 04/03/2009  . CHEST PAIN, RECURRENT 03/07/2009  . OTHER DYSPHAGIA 03/07/2009  . PALPITATIONS 02/06/2009  . LIVER FUNCTION TESTS, ABNORMAL, HX OF 01/01/2009  . MORBID OBESITY 12/31/2008  . PUD 12/31/2008  . BACK PAIN 12/24/2008  . ANSERINE BURSITIS 12/24/2008  . BENIGN POSITIONAL VERTIGO 07/16/2008  . H N P-LUMBAR 04/02/2008  . SPINAL STENOSIS, CERVICAL 02/15/2008  . SPINAL STENOSIS, LUMBAR 02/15/2008  . Patellar tendinitis 10/19/2007  . TEAR LATERAL MENISCUS 06/13/2007  . OBESITY NOS 10/29/2006  . ALLERGIC RHINITIS, SEASONAL 10/29/2006  . HYPOTHYROIDISM 09/07/2006  . HYPERLIPIDEMIA 09/07/2006  . HYPERTENSION 09/07/2006  . GERD 09/07/2006  . DIVERTICULOSIS, COLON 09/07/2006  . OVERACTIVE BLADDER 09/07/2006  . FIBROCYSTIC BREAST DISEASE 09/07/2006  . OSTEOARTHRITIS 09/07/2006    PT - End of Session Activity Tolerance: Patient tolerated treatment well General Behavior During  Therapy: WFL for tasks assessed/performed  GP Functional Limitation: Mobility: Walking and moving around Mobility: Walking and Moving Around Goal Status [phone]): At least 40 percent but less than 60 percent impaired, limited or restricted Mobility: Walking and Moving Around Discharge Status [phone]): At least 1 percent but less than 20 percent impaired, limited or restricted  RUSSELL,CINDY 03/16/2014, 4:35 PM  Physician Documentation Your signature is required to indicate approval of the treatment plan as stated above.  Please sign and either send electronically or make a copy of this report for your files and return this physician signed original.   Please mark one 1.__approve of plan  2. ___approve of plan with the following conditions.   ______________________________                                                          _____________________ Physician Signature                                                                                                             Date

## 2014-03-21 ENCOUNTER — Telehealth: Payer: Self-pay | Admitting: Radiology

## 2014-03-21 NOTE — Telephone Encounter (Signed)
Patient calling for her refill on her Oxycodone. [phone].

## 2014-03-22 ENCOUNTER — Other Ambulatory Visit: Payer: Self-pay | Admitting: Orthopedic Surgery

## 2014-03-22 MED ORDER — OXYCODONE-ACETAMINOPHEN 5-325 MG PO TABS: 1.0000 | ORAL_TABLET | Freq: Four times a day (QID) | ORAL | Status: DC | PRN

## 2014-04-24 ENCOUNTER — Ambulatory Visit (INDEPENDENT_AMBULATORY_CARE_PROVIDER_SITE_OTHER): Payer: Self-pay | Admitting: Orthopedic Surgery

## 2014-04-24 VITALS — BP 106/67 | Ht 66.0 in | Wt 213.0 lb

## 2014-04-24 DIAGNOSIS — M1710 Unilateral primary osteoarthritis, unspecified knee: Secondary | ICD-10-CM

## 2014-04-24 DIAGNOSIS — Z96651 Presence of right artificial knee joint: Secondary | ICD-10-CM

## 2014-04-24 DIAGNOSIS — Z96659 Presence of unspecified artificial knee joint: Secondary | ICD-10-CM

## 2014-04-24 DIAGNOSIS — M1711 Unilateral primary osteoarthritis, right knee: Secondary | ICD-10-CM

## 2014-04-24 MED ORDER — GABAPENTIN 600 MG PO TABS: ORAL_TABLET | ORAL | Status: DC

## 2014-04-24 MED ORDER — HYDROCODONE-ACETAMINOPHEN 7.5-325 MG PO TABS: 1.0000 | ORAL_TABLET | Freq: Four times a day (QID) | ORAL | Status: DC | PRN

## 2014-04-24 NOTE — Progress Notes (Signed)
Patient ID: Sara [name], female   DOB: 1945-11-03, 68 y.o.   MRN: [id number] Chief Complaint  Patient presents with  . Follow-up    post op #3, Right TKA, DOS 01/24/14    BP 106/67  Ht 5\' 6"  (1.676 m)  Wt 213 lb (96.616 kg)  BMI 34.40 kg/m2  C/O PAIN ALONG THE LATERAL ASPECT of the right knee she says that the feeling has started to come back on the lateral portion of the incision  She is doing very well except for cold and rainy days.  Her knee looks good her flexion is good her extension is good extension power is excellent knee is stable  I will increase her gabapentin for now she will switch oxycodone to hydrocodone 7.5 mg followup in 3 months

## 2014-05-15 ENCOUNTER — Telehealth: Payer: Self-pay | Admitting: Orthopedic Surgery

## 2014-05-15 ENCOUNTER — Other Ambulatory Visit: Payer: Self-pay | Admitting: *Deleted

## 2014-05-15 MED ORDER — HYDROCODONE-ACETAMINOPHEN 7.5-325 MG PO TABS: 1.0000 | ORAL_TABLET | Freq: Four times a day (QID) | ORAL | Status: DC | PRN

## 2014-05-15 NOTE — Telephone Encounter (Signed)
Prescription available for pick up and patient aware

## 2014-05-15 NOTE — Telephone Encounter (Signed)
Patient called to request refill on pain medication: HYDROcodone-acetaminophen (NORCO) 7.5-325 MG per tablet  Her phone # is [phone].

## 2014-06-19 ENCOUNTER — Other Ambulatory Visit: Payer: Self-pay | Admitting: *Deleted

## 2014-06-19 ENCOUNTER — Telehealth: Payer: Self-pay | Admitting: Orthopedic Surgery

## 2014-06-19 MED ORDER — HYDROCODONE-ACETAMINOPHEN 7.5-325 MG PO TABS: 1.0000 | ORAL_TABLET | Freq: Four times a day (QID) | ORAL | Status: DC | PRN

## 2014-06-19 NOTE — Telephone Encounter (Signed)
Having bilateral knee pain, she is asking for a refill on her pain meds,HYDROcodone-acetaminophen (NORCO) 7.5-325 MG per tablet, She uses [facility] in [place], Please advise?

## 2014-06-19 NOTE — Telephone Encounter (Signed)
Prescription available, patient aware  

## 2014-06-20 NOTE — Telephone Encounter (Signed)
Patient picked up Rx

## 2014-07-04 ENCOUNTER — Other Ambulatory Visit (HOSPITAL_COMMUNITY): Payer: Self-pay | Admitting: Family Medicine

## 2014-07-04 DIAGNOSIS — Z1231 Encounter for screening mammogram for malignant neoplasm of breast: Secondary | ICD-10-CM

## 2014-07-17 ENCOUNTER — Telehealth: Payer: Self-pay | Admitting: Orthopedic Surgery

## 2014-07-17 ENCOUNTER — Other Ambulatory Visit: Payer: Self-pay | Admitting: *Deleted

## 2014-07-17 MED ORDER — HYDROCODONE-ACETAMINOPHEN 7.5-325 MG PO TABS: 1.0000 | ORAL_TABLET | Freq: Four times a day (QID) | ORAL | Status: DC | PRN

## 2014-07-17 NOTE — Telephone Encounter (Signed)
Patient is calling asking for a refill on her pain medication, HYDROcodone-acetaminophen (NORCO) 7.5-325 MG per tablet please advise?

## 2014-07-18 NOTE — Telephone Encounter (Signed)
Routing to front desk

## 2014-07-18 NOTE — Telephone Encounter (Signed)
Prescription available for pick up, called patient, no answer, left vm

## 2014-07-18 NOTE — Telephone Encounter (Signed)
Patient is aware 

## 2014-07-26 ENCOUNTER — Encounter: Payer: Self-pay | Admitting: Orthopedic Surgery

## 2014-07-26 ENCOUNTER — Ambulatory Visit (INDEPENDENT_AMBULATORY_CARE_PROVIDER_SITE_OTHER): Payer: PRIVATE HEALTH INSURANCE | Admitting: Orthopedic Surgery

## 2014-07-26 VITALS — BP 110/62 | Ht 66.0 in | Wt 213.0 lb

## 2014-07-26 DIAGNOSIS — M1712 Unilateral primary osteoarthritis, left knee: Secondary | ICD-10-CM

## 2014-07-26 DIAGNOSIS — M7542 Impingement syndrome of left shoulder: Secondary | ICD-10-CM | POA: Diagnosis not present

## 2014-07-26 DIAGNOSIS — Z96651 Presence of right artificial knee joint: Secondary | ICD-10-CM

## 2014-07-26 NOTE — Progress Notes (Signed)
Patient ID: Sara [name], female   DOB: 1946-06-14, 69 y.o.   MRN: [id number] Chief Complaint  Patient presents with  . Follow-up    3 month recheck Right knee, TKA 01/24/14    Several issues today.  The patient is here for her six-month checkup status post right total knee she's doing well except for some occasional swelling along the lateral side and along the medial tendons. The joint is normal she's walking well her preop pain is relieved. She's regained 115 of knee flexion.  Second problem today pain left knee with feelings of giving way and medial joint line pain causing her to not want to walk and she has difficulty getting out of a chair and some occasional giving way symptoms in the left knee  Problem #3 atraumatic onset of pain over the left shoulder including the periapical acromial region without radiation other than into the elbow she has some anterior chest wall pain near the coracoid she has painful for elevation and pain at night when rolling onto the left side  Past Medical History  Diagnosis Date  . Thyroid disease   . Chronic back pain   . Reflux   . Unspecified arthropathy, shoulder region   . Pure hypercholesterolemia     takes Pravastin daily  . Allergic rhinitis due to pollen   . Cervicalgia   . Diverticulosis of colon (without mention of hemorrhage)   . Morbid obesity   . Palpitations   . Peptic ulcer, unspecified site, unspecified as acute or chronic, without mention of hemorrhage, perforation, or obstruction   . Peripheral vertigo, unspecified     takes Meclizine prn  . Reflux esophagitis   . Spinal stenosis in cervical region   . 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 Personal history of tobacco use, presenting hazards to health   . Insomnia, unspecified   . Lumbago   . Right leg pain   . Shortness of breath   . H/O hiatal hernia   . HTN (hypertension)     takes Metoprolol and  Lisinopril daily  . Hypertension   . Sleep apnea     slight but doesn't require a CPAP  . History of bronchitis     > 72yr ago  . Seasonal allergies     uses Flonase daily  . Headache(784.0)     denies migraines since the 80's but has occ and takes Butalbital prn  . Cervical spondylosis without myelopathy     all over  . Degeneration of cervical intervertebral disc   . Primary localized osteoarthrosis, lower leg   . GERD (gastroesophageal reflux disease)     takes Omeprazole daily  . Constipation   . Diverticulosis   . History of bladder infections   . Urinary incontinence     occasionally  . Other postablative hypothyroidism     takes SYnthroid daily  . Insomnia     takes Ativan nigtly   Review of systems there is no evidence of fever chills or fatigue no numbness or tingling in the arms weakness in the left knee noted.  BP 110/62 mmHg  Ht 5\' 6"  (1.676 m)  Wt 213 lb (96.616 kg)  BMI 34.40 kg/m2 Gen. Appearance she is meticulously groomed well-developed well-nourished grooming and hygiene again normal oriented 3 mood and affect normal gait and station normal without assistive device  Right knee tenderness along the petrous tendons and the iliotibial  band knee joint nonswollen knee incision healed knee flexion 115 knee extension normal stability normal.  Left knee medial joint line tenderness lateral joint line tenderness knee flexion 100 knee stable collateral ligaments and cruciate ligaments extension power normal with straight leg raise no extensor lag skin intact no rash no ulceration no lesions  Distal neurovascular exam intact  Left shoulder. Acromial tenderness positive impingement sign decreased for elevation. Stability tests normal strength test normal. Skin normal.  The x-ray we have of her left knee shows osteoarthritis with varus alignment.  Diagnosis #1 status post right total knee doing well with some mild tendinitis recommend  Echo for neck 3% lidocaine 5%  menthol 1% cream 100 g dispensed. Apply 3-4 times daily.  Diagnoses #2 Left shoulder impingement syndrome Inject left subacromial space Procedure note the subacromial injection shoulder left  Verbal consent was obtained to inject the  left  Shoulder  Timeout was completed to confirm the injection site is a subacromial space of the  Left shoulder   Medication used Depo-Medrol 40 mg and lidocaine 1% 3 cc  Anesthesia was provided by ethyl chloride  The injection was performed in the left posterior subacromial space. After pinning the skin with alcohol and anesthetized the skin with ethyl chloride the subacromial space was injected using a 20-gauge needle. There were no complications  Sterile dressing was applied.     Diagnoses #3 Worsening left knee pain with osteoarthritis  After discussion of injection versus knee replacement she opted for knee replacement. We'll replace the left knee.

## 2014-07-26 NOTE — Patient Instructions (Signed)
SURGERY November 18, LEFT TKA

## 2014-07-27 ENCOUNTER — Telehealth: Payer: Self-pay | Admitting: Orthopedic Surgery

## 2014-07-27 ENCOUNTER — Other Ambulatory Visit: Payer: Self-pay | Admitting: *Deleted

## 2014-07-27 MED ORDER — BUPIVACAINE LIPOSOME 1.3 % IJ SUSP: 20.0000 mL | Freq: Once | INTRAMUSCULAR | Status: DC

## 2014-07-27 NOTE — Telephone Encounter (Signed)
Regarding in-patient/admit surgery, scheduled 08/08/14, at [facility], [place], Left total knee replacement (arthroplasty); contacted primary insurer,[employer] - spoke w/Mary M., 07/26/14, 10:50a.m; received REF# [id number].  Case pending; if clinicals are needed, our office will be contacted by a nurse reviewer. Customer service manager Main [phone])  Also contacted secondary insurer, Medicaid; Per [employer] online system, no pre-authorization is required.

## 2014-07-31 NOTE — Telephone Encounter (Signed)
Received questionnaire, which has been completed by Dr Aline Brochure, and faxed, along with clinicals, as per request to Attention Nadean Corwin, [employer], to [phone].  Follow up by 11/13 if no response.

## 2014-08-01 NOTE — Patient Instructions (Signed)
P     Sara [name]  08/01/2014   Your procedure is scheduled on:  08/08/2014  Report to [facility] at  24  AM.  Call this number if you have problems the morning of surgery: [phone]   Remember:   Do not eat food or drink liquids after midnight.   Take these medicines the morning of surgery with A SIP OF WATER: neurontin, hydrocodone, levothyroxine, lorazepam, losartan, metoprolol, prilosec   Do not wear jewelry, make-up or nail polish.  Do not wear lotions, powders, or perfumes.   Do not shave 48 hours prior to surgery. Men may shave face and neck.  Do not bring valuables to the hospital.  [facility] is not responsible  for any belongings or valuables.               Contacts, dentures or bridgework may not be worn into surgery.  Leave suitcase in the car. After surgery it may be brought to your room.  For patients admitted to the hospital, discharge time is determined by your treatment team.               Patients discharged the day of surgery will not be allowed to drive home.  Name and phone number of your driver: family  Special Instructions: Shower using CHG 2 nights before surgery and the night before surgery.  If you shower the day of surgery use CHG.  Use special wash - you have one bottle of CHG for all showers.  You should use approximately 1/3 of the bottle for each shower.   Please read over the following fact sheets that you were given: Pain Booklet, Coughing and Deep Breathing, Blood Transfusion Information, Total Joint Packet, MRSA Information, Surgical Site Infection Prevention, Anesthesia Post-op Instructions and Care and Recovery After Surgery Total Knee Replacement Total knee replacement is a procedure to replace your knee joint with an artificial knee joint (prosthetic knee joint). The purpose of this surgery is to reduce pain and improve your knee function. LET [facility] CARE PROVIDER KNOW ABOUT:   Any allergies you have.  All medicines you  are taking, including vitamins, herbs, eye drops, creams, and over-the-counter medicines.  Previous problems you or members of your family have had with the use of anesthetics.  Any blood disorders you have.  Previous surgeries you have had.  Medical conditions you have. RISKS AND COMPLICATIONS  Generally, total knee replacement is a safe procedure. However, problems can occur and include:  Loss of range of motion of the knee or instability.  Loosening of the prosthesis.  Infection.  Persistent pain. BEFORE THE PROCEDURE   Do not eat or drink anything after midnight on the night before the procedure or as directed by your health care provider.  Ask your health care provider about changing or stopping your regular medicines. This is especially important if you are taking diabetes medicines or blood thinners. PROCEDURE   Just before the procedure, you will receive medicine that will make you drowsy (sedative). This will be given through a tube that is inserted into one of your veins (IV tube).  Then you will be given one of the following:  A medicine injected into your spine that numbs your body below the waist (spinal anesthetic).  A medicine that makes you fall asleep (general anesthetic).  You may also receive medicine to block feeling in your leg (nerve block) to help ease pain after surgery.  An incision will be  made in your knee. Your surgeon will take out any damaged cartilage and bone by sawing off the damaged surfaces.  The surgeon will then put a new metal liner over the sawed-off portion of your thigh bone (femur) and a plastic liner over the sawed-off portion of one of the bones of your lower leg (tibia). This is to restore alignment and function to your knee. A plastic piece is often used to restore the surface of your knee cap. AFTER THE PROCEDURE   You will be taken to the recovery area.  You may have drainage tubes to drain excess fluid from your knee. These  tubes attach to a device that removes these fluids.  Once you are awake, stable, and taking fluids well, you will be taken to your hospital room.  You will receive physical therapy as prescribed by your health care provider.  Your surgeon may recommend that you spend time (usually an additional 10-14 days) in an extended-care facility to help you begin walking again and improve your range of motion before you go home.  You may also be prescribed blood-thinning medicine to decrease your risk of developing blood clots in your leg. Document Released: 12/14/2000 Document Revised: 01/22/2014 Document Reviewed: 10/18/2011 [facility] Patient Information 2015 [place], [place]. This information is not intended to replace advice given to you by your health care provider. Make sure you discuss any questions you have with your health care provider. ATIENT INSTRUCTIONS POST-ANESTHESIA  IMMEDIATELY FOLLOWING SURGERY:  Do not drive or operate machinery for the first twenty four hours after surgery.  Do not make any important decisions for twenty four hours after surgery or while taking narcotic pain medications or sedatives.  If you develop intractable nausea and vomiting or a severe headache please notify your doctor immediately.  FOLLOW-UP:  Please make an appointment with your surgeon as instructed. You do not need to follow up with anesthesia unless specifically instructed to do so.  WOUND CARE INSTRUCTIONS (if applicable):  Keep a dry clean dressing on the anesthesia/puncture wound site if there is drainage.  Once the wound has quit draining you may leave it open to air.  Generally you should leave the bandage intact for twenty four hours unless there is drainage.  If the epidural site drains for more than 36-48 hours please call the anesthesia department.  QUESTIONS?:  Please feel free to call your physician or the hospital operator if you have any questions, and they will be happy to assist you.

## 2014-08-02 ENCOUNTER — Encounter (HOSPITAL_COMMUNITY): Payer: Self-pay

## 2014-08-02 ENCOUNTER — Encounter (HOSPITAL_COMMUNITY)
Admission: RE | Admit: 2014-08-02 | Discharge: 2014-08-02 | Disposition: A | Discharge status: Home / Self Care | Payer: PRIVATE HEALTH INSURANCE | Source: Ambulatory Visit | Attending: Orthopedic Surgery | Admitting: Orthopedic Surgery

## 2014-08-02 ENCOUNTER — Other Ambulatory Visit: Payer: Self-pay

## 2014-08-02 DIAGNOSIS — Z01818 Encounter for other preprocedural examination: Secondary | ICD-10-CM | POA: Diagnosis present

## 2014-08-02 LAB — CBC WITH DIFFERENTIAL/PLATELET
Basophils Absolute: 0 10*3/uL (ref 0.0–0.1)
Basophils Relative: 1 % (ref 0–1)
Eosinophils Absolute: 0.1 10*3/uL (ref 0.0–0.7)
Eosinophils Relative: 2 % (ref 0–5)
HCT: 44.2 % (ref 36.0–46.0)
Hemoglobin: 15 g/dL (ref 12.0–15.0)
Lymphocytes Relative: 39 % (ref 12–46)
Lymphs Abs: 3 10*3/uL (ref 0.7–4.0)
MCH: 31.9 pg (ref 26.0–34.0)
MCHC: 33.9 g/dL (ref 30.0–36.0)
MCV: 94 fL (ref 78.0–100.0)
Monocytes Absolute: 0.6 10*3/uL (ref 0.1–1.0)
Monocytes Relative: 8 % (ref 3–12)
Neutro Abs: 4 10*3/uL (ref 1.7–7.7)
Neutrophils Relative %: 50 % (ref 43–77)
Platelets: 236 10*3/uL (ref 150–400)
RBC: 4.7 MIL/uL (ref 3.87–5.11)
RDW: 13.5 % (ref 11.5–15.5)
WBC: 7.9 10*3/uL (ref 4.0–10.5)

## 2014-08-02 LAB — BASIC METABOLIC PANEL
Anion gap: 12 (ref 5–15)
BUN: 13 mg/dL (ref 6–23)
CO2: 29 mEq/L (ref 19–32)
Calcium: 9.2 mg/dL (ref 8.4–10.5)
Chloride: 100 mEq/L (ref 96–112)
Creatinine, Ser: 0.88 mg/dL (ref 0.50–1.10)
GFR calc Af Amer: 77 mL/min — ABNORMAL LOW (ref >90)
GFR calc non Af Amer: 66 mL/min — ABNORMAL LOW (ref >90)
Glucose, Bld: 72 mg/dL (ref 70–99)
Potassium: 4.2 mEq/L (ref 3.7–5.3)
Sodium: 141 mEq/L (ref 137–147)

## 2014-08-02 LAB — PROTIME-INR
INR: 1.02 (ref 0.00–1.49)
Prothrombin Time: 13.5 seconds (ref 11.6–15.2)

## 2014-08-02 LAB — PREPARE RBC (CROSSMATCH)

## 2014-08-02 LAB — APTT: aPTT: 31 seconds (ref 24–37)

## 2014-08-02 LAB — SURGICAL PCR SCREEN
MRSA, PCR: NEGATIVE
Staphylococcus aureus: NEGATIVE

## 2014-08-07 LAB — ABO/RH: ABO/RH(D): B POS

## 2014-08-07 MED ORDER — TRANEXAMIC ACID 100 MG/ML IV SOLN
1000.0000 mg | INTRAVENOUS | Status: AC
  Administered 2014-08-08: 1000 mg via INTRAVENOUS
  Filled 2014-08-07: qty 10

## 2014-08-07 NOTE — H&P (Signed)
TOTAL KNEE ADMISSION H&P  Patient is being admitted for right total knee arthroplasty.  Subjective:  Chief Complaint:right knee pain.  HPI: Sara [name], 69 y.o. female, has a history of pain and functional disability in the left knee due to arthritis and has failed non-surgical conservative treatments for greater than 12 weeks to includeNSAID's and/or analgesics, corticosteriod injections, flexibility and strengthening excercises, supervised PT with diminished ADL's post treatment, use of assistive devices, activity modification. Patient has night pain, worsening of pain with activity and weight bearing, pain that interferes with activities of daily living, pain with passive range of motion, crepitus and joint swelling.  Patient has evidence of subchondral sclerosis, periarticular osteophytes and joint space narrowing by imaging studies. This patient has had non operative measures . There is no active infection.    Patient Active Problem List     Diagnosis  Date Noted   .  Abnormality of gait  10/11/2013   .  S/P arthroscopy of right knee  10/09/2013   .  Radicular pain of right lower extremity  07/25/2013   .  Knee instability  07/25/2013   .  Right knee pain  07/25/2013   .  Chest pain  04/25/2013   .  Patellar tendonitis  09/07/2012   .  OA (osteoarthritis) of knee  02/09/2012   .  GERD (gastroesophageal reflux disease)  12/01/2011   .  Dysphagia  12/01/2011   .  RUQ pain  12/01/2011   .  Fatty liver  12/01/2011   .  Hematochezia  12/01/2011   .  INTERMITTENT VERTIGO  09/12/2010   .  KNEE, ARTHRITIS, DEGEN./OSTEO  04/03/2009   .  CHEST PAIN, RECURRENT  03/07/2009   .  OTHER DYSPHAGIA  03/07/2009   .  PALPITATIONS  02/06/2009   .  LIVER FUNCTION TESTS, ABNORMAL, HX OF  01/01/2009   .  MORBID OBESITY  12/31/2008   .  PUD  12/31/2008   .  BACK PAIN  12/24/2008   .  ANSERINE BURSITIS  12/24/2008   .  BENIGN POSITIONAL VERTIGO  07/16/2008   .  H N P-LUMBAR  04/02/2008   .   SPINAL STENOSIS, CERVICAL  02/15/2008   .  SPINAL STENOSIS, LUMBAR  02/15/2008   .  Patellar tendinitis  10/19/2007   .  TEAR LATERAL MENISCUS  06/13/2007   .  OBESITY NOS  10/29/2006   .  ALLERGIC RHINITIS, SEASONAL  10/29/2006   .  HYPOTHYROIDISM  09/07/2006   .  HYPERLIPIDEMIA  09/07/2006   .  HYPERTENSION  09/07/2006   .  GERD  09/07/2006   .  DIVERTICULOSIS, COLON  09/07/2006   .  OVERACTIVE BLADDER  09/07/2006   .  FIBROCYSTIC BREAST DISEASE  09/07/2006   .  OSTEOARTHRITIS  09/07/2006    Past Medical History   Diagnosis  Date   .  Thyroid disease     .  Chronic back pain     .  Reflux     .  Unspecified arthropathy, shoulder region     .  Pure hypercholesterolemia         takes Pravastin daily   .  Allergic rhinitis due to pollen     .  Cervicalgia     .  Diverticulosis of colon (without mention of hemorrhage)     .  Morbid obesity     .  Palpitations     .  Peptic ulcer, unspecified site, unspecified as  acute or chronic, without mention of hemorrhage, perforation, or obstruction     .  Peripheral vertigo, unspecified         takes Meclizine prn   .  Reflux esophagitis     .  Spinal stenosis in cervical region     .  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  Personal history of tobacco use, presenting hazards to health     .  Insomnia, unspecified     .  Lumbago     .  Right leg pain     .  Shortness of breath     .  H/O hiatal hernia     .  HTN (hypertension)         takes Metoprolol and Lisinopril daily   .  Hypertension     .  Sleep apnea         slight but doesn't require a CPAP   .  History of bronchitis         > 110yr ago   .  Seasonal allergies         uses Flonase daily   .  Headache(784.0)         denies migraines since the 80's but has occ and takes Butalbital prn   .  Cervical spondylosis without myelopathy         all over   .  Degeneration of cervical intervertebral disc     .   Primary localized osteoarthrosis, lower leg     .  GERD (gastroesophageal reflux disease)         takes Omeprazole daily   .  Constipation     .  Diverticulosis     .  History of bladder infections     .  Urinary incontinence         occasionally   .  Other postablative hypothyroidism         takes SYnthroid daily   .  Insomnia         takes Ativan nigtly      Past Surgical History   Procedure  Laterality  Date   .  Knee surgery           arthroscopy-- R   .  Tonsillectomy       .  Appendectomy       .  Colonoscopy    Sept 2008       RMR: normal rectum, left-sided diverticula, repeat in 2018   .  Abdominal hysterectomy       .  Upper gastrointestinal endoscopy       .  Breast lumpectomy           left    .  Eye surgery           cataracts removed- bilateral, /w IOL   .  Lumpectomy on pelvis       .  Diagnostic laparoscopy       .  Tubal ligation       .  Esophagogastroduodenoscopy       .  Anterior lat lumbar fusion    05/13/2012       Procedure: ANTERIOR LATERAL LUMBAR FUSION 2 LEVELS;  Surgeon: Faythe Ghee, MD;  Location: [place] [facility];  Service: Neurosurgery;  Laterality: Left;  Left Lumbar Three-four,Lumbar four-five Extreme Lumbar Interbody Fusion with Percutaneous Pedicle  Screws   .  Spinal fusion       .  Knee arthroscopy with lateral menisectomy  Right  10/06/2013       Procedure: KNEE ARTHROSCOPY WITH LATERAL AND MEDIAL MENISECTOMY;  Surgeon: Carole Civil, MD;  Location: [facility];  Service: Orthopedics;  Laterality: Right;  END @ 1234   .  Foreign body removal  Right  10/06/2013       Procedure: REMOVAL FOREIGN BODY EXTREMITY;  Surgeon: Carole Civil, MD;  Location: [facility];  Service: Orthopedics;  Laterality: Right;   .  Injection knee  Left  10/06/2013       Procedure: KNEE INJECTION;  Surgeon: Carole Civil, MD;  Location: [facility];  Service: Orthopedics;  Laterality: Left;      No prescriptions prior to admission    Allergies   Allergen  Reactions    .  Darvon  Other (See Comments)       Hallucinations     .  Propoxyphene Hcl  Other (See Comments)        Hallucinations   .  Camphor  Itching   .  Dust Mite Extract  Itching       Sneezing. Runny nose     .  Molds & Smuts  Itching   .  Tomato  Hives   .  Latex  Dermatitis and Rash      History   Substance Use Topics   .  Smoking status:  Former Smoker -- 1.00 packs/day for 27 years       Types:  Cigarettes   .  Smokeless tobacco:  Former Systems developer       Quit date:  10/02/1985   .  Alcohol Use:  Yes         Comment: occasional wine(red)      Family History   Problem  Relation  Age of Onset   .  Colon cancer  Neg Hx         ROS Her review of systems is essentially normal for any acute processes regarding the chest respiratory GI GU tract  She does have some residual neurologic symptoms in the right leg with pain along the side of the leg into the dorsum of the foot  Objective:  Physical Exam  BP 102/62  Ht 5\' 6"  (1.676 m)  Wt 249 lb (112.946 kg)  BMI 40.21 kg/m2 General appearance is normal, the patient is alert and oriented x3 with normal mood and affect. Upper extremity exam  The right and left upper extremity:     Inspection and palpation revealed no abnormalities in the upper extremities.    Range of motion is full without contracture.  Motor exam is normal with grade 5 strength.  The joints are fully reduced without subluxation.  There is no atrophy or tremor and muscle tone is normal.  All joints are stable.   Left leg medial joint line tenderness small effusion diminished range of motion but ligament stable motor exam normal skin intact  Right knee status post right total knee arthroplasty doing well other than some mild tendinitis in the past bursa and iliotibial band. Skin incision clean dry and intact well-healed, motor exam normal. Knee stable. Range of motion 115.   She is ambulatory without assistive device  No weakness detected in the  foot Neurovascular exam intact X-ray of the knee shows mild degenerative changes  Vital signs in last 24 hours:    Labs:   Estimated  body mass index is 35.04 kg/(m^2) as calculated from the following:   Height as of 01/02/14: 5\' 6"  (1.676 m).   Weight as of 01/02/14: 217 lb (98.431 kg).   Imaging Review Plain radiographs demonstrate moderate degenerative joint disease of the right knee(s). The overall alignment isaltered. The bone quality appears to be good for age and reported activity level.  Assessment/Plan:  End stage arthritis, left knee   The patient history, physical examination, clinical judgment of the provider and imaging studies are consistent with end stage degenerative joint disease of the left knee(s) and total knee arthroplasty is deemed medically necessary. The treatment options including medical management, injection therapy arthroscopy and arthroplasty were discussed at length. The risks and benefits of total knee arthroplasty were presented and reviewed. The risks due to aseptic loosening, infection, stiffness, patella tracking problems, thromboembolic complications and other imponderables were discussed. The patient acknowledged the explanation, agreed to proceed with the plan and consent was signed. Patient is being admitted for inpatient treatment for surgery, pain control, PT, OT, prophylactic antibiotics, VTE prophylaxis, progressive ambulation and ADL's and discharge planning. The patient is planning to be discharged home with home health services

## 2014-08-07 NOTE — Telephone Encounter (Signed)
08/06/14, 5:25pm, per [employer], surgery, as noted, (CPT [id number]) has been approved under same [id number]

## 2014-08-08 ENCOUNTER — Inpatient Hospital Stay (HOSPITAL_COMMUNITY)
Admission: RE | Admit: 2014-08-08 | Discharge: 2014-08-11 | DRG: 470 | Disposition: A | Discharge status: Home Health | Payer: PRIVATE HEALTH INSURANCE | Source: Ambulatory Visit | Attending: Orthopedic Surgery | Admitting: Orthopedic Surgery

## 2014-08-08 ENCOUNTER — Inpatient Hospital Stay (HOSPITAL_COMMUNITY): Payer: PRIVATE HEALTH INSURANCE | Admitting: Anesthesiology

## 2014-08-08 ENCOUNTER — Inpatient Hospital Stay (HOSPITAL_COMMUNITY): Payer: PRIVATE HEALTH INSURANCE

## 2014-08-08 ENCOUNTER — Encounter (HOSPITAL_COMMUNITY): Payer: Self-pay

## 2014-08-08 ENCOUNTER — Encounter (HOSPITAL_COMMUNITY)
Admission: RE | Disposition: A | Discharge status: Home Health | Payer: Self-pay | Source: Ambulatory Visit | Attending: Orthopedic Surgery

## 2014-08-08 DIAGNOSIS — E785 Hyperlipidemia, unspecified: Secondary | ICD-10-CM | POA: Diagnosis present

## 2014-08-08 DIAGNOSIS — Z419 Encounter for procedure for purposes other than remedying health state, unspecified: Secondary | ICD-10-CM

## 2014-08-08 DIAGNOSIS — M1712 Unilateral primary osteoarthritis, left knee: Principal | ICD-10-CM | POA: Diagnosis present

## 2014-08-08 DIAGNOSIS — Z87891 Personal history of nicotine dependence: Secondary | ICD-10-CM

## 2014-08-08 DIAGNOSIS — K219 Gastro-esophageal reflux disease without esophagitis: Secondary | ICD-10-CM | POA: Diagnosis present

## 2014-08-08 DIAGNOSIS — M129 Arthropathy, unspecified: Secondary | ICD-10-CM | POA: Diagnosis present

## 2014-08-08 DIAGNOSIS — I10 Essential (primary) hypertension: Secondary | ICD-10-CM | POA: Diagnosis present

## 2014-08-08 DIAGNOSIS — K760 Fatty (change of) liver, not elsewhere classified: Secondary | ICD-10-CM | POA: Diagnosis present

## 2014-08-08 DIAGNOSIS — E780 Pure hypercholesterolemia: Secondary | ICD-10-CM | POA: Diagnosis present

## 2014-08-08 DIAGNOSIS — Z96651 Presence of right artificial knee joint: Secondary | ICD-10-CM

## 2014-08-08 DIAGNOSIS — G4730 Sleep apnea, unspecified: Secondary | ICD-10-CM | POA: Diagnosis present

## 2014-08-08 DIAGNOSIS — M25562 Pain in left knee: Secondary | ICD-10-CM | POA: Diagnosis present

## 2014-08-08 HISTORY — PX: TOTAL KNEE ARTHROPLASTY: SHX125

## 2014-08-08 SURGERY — ARTHROPLASTY, KNEE, TOTAL
Anesthesia: Monitor Anesthesia Care | Site: Knee | Laterality: Left

## 2014-08-08 MED ORDER — FLUTICASONE PROPIONATE 50 MCG/ACT NA SUSP
2.0000 | Freq: Every day | NASAL | Status: DC
  Administered 2014-08-08 – 2014-08-10 (×3): 2 via NASAL
  Filled 2014-08-08: qty 16

## 2014-08-08 MED ORDER — ONDANSETRON HCL 4 MG/2ML IJ SOLN
4.0000 mg | Freq: Once | INTRAMUSCULAR | Status: AC
  Administered 2014-08-08: 4 mg via INTRAVENOUS
  Filled 2014-08-08: qty 2

## 2014-08-08 MED ORDER — CHLORHEXIDINE GLUCONATE 4 % EX LIQD: 60.0000 mL | Freq: Once | CUTANEOUS | Status: DC

## 2014-08-08 MED ORDER — OXYCODONE-ACETAMINOPHEN 5-325 MG PO TABS
1.0000 | ORAL_TABLET | ORAL | Status: DC
  Administered 2014-08-08 – 2014-08-11 (×18): 1 via ORAL
  Filled 2014-08-08 (×18): qty 1

## 2014-08-08 MED ORDER — METOPROLOL TARTRATE 25 MG PO TABS
25.0000 mg | ORAL_TABLET | Freq: Two times a day (BID) | ORAL | Status: DC
  Administered 2014-08-08 – 2014-08-11 (×6): 25 mg via ORAL
  Filled 2014-08-08 (×7): qty 1

## 2014-08-08 MED ORDER — ALUM & MAG HYDROXIDE-SIMETH 200-200-20 MG/5ML PO SUSP: 30.0000 mL | ORAL | Status: DC | PRN

## 2014-08-08 MED ORDER — LEVOTHYROXINE SODIUM 88 MCG PO TABS
88.0000 ug | ORAL_TABLET | Freq: Every day | ORAL | Status: DC
  Administered 2014-08-09 – 2014-08-11 (×3): 88 ug via ORAL
  Filled 2014-08-08 (×3): qty 1

## 2014-08-08 MED ORDER — SODIUM CHLORIDE 0.9 % IR SOLN
Status: DC | PRN
  Administered 2014-08-08: 1000 mL
  Administered 2014-08-08: 3000 mL

## 2014-08-08 MED ORDER — FENTANYL CITRATE 0.05 MG/ML IJ SOLN
INTRAMUSCULAR | Status: DC | PRN
  Administered 2014-08-08 (×4): 25 ug via INTRAVENOUS

## 2014-08-08 MED ORDER — PROPOFOL INFUSION 10 MG/ML OPTIME
INTRAVENOUS | Status: DC | PRN
  Administered 2014-08-08: 30 ug/kg/min via INTRAVENOUS
  Administered 2014-08-08: 09:00:00 via INTRAVENOUS

## 2014-08-08 MED ORDER — ONDANSETRON HCL 4 MG/2ML IJ SOLN: 4.0000 mg | Freq: Once | INTRAMUSCULAR | Status: DC | PRN

## 2014-08-08 MED ORDER — DEXTROSE 5 % IV SOLN
500.0000 mg | Freq: Four times a day (QID) | INTRAVENOUS | Status: DC | PRN
  Filled 2014-08-08: qty 5

## 2014-08-08 MED ORDER — LACTATED RINGERS IV SOLN
INTRAVENOUS | Status: DC
  Administered 2014-08-08 (×2): via INTRAVENOUS

## 2014-08-08 MED ORDER — FENTANYL CITRATE 0.05 MG/ML IJ SOLN
INTRAMUSCULAR | Status: AC
  Filled 2014-08-08: qty 2

## 2014-08-08 MED ORDER — PROPOFOL 10 MG/ML IV BOLUS
INTRAVENOUS | Status: AC
  Filled 2014-08-08: qty 20

## 2014-08-08 MED ORDER — EPHEDRINE SULFATE 50 MG/ML IJ SOLN
INTRAMUSCULAR | Status: DC | PRN
  Administered 2014-08-08: 10 mg via INTRAVENOUS
  Administered 2014-08-08: 15 mg via INTRAVENOUS

## 2014-08-08 MED ORDER — BUPIVACAINE IN DEXTROSE 0.75-8.25 % IT SOLN
INTRATHECAL | Status: AC
  Filled 2014-08-08: qty 2

## 2014-08-08 MED ORDER — ONDANSETRON HCL 4 MG/2ML IJ SOLN
INTRAMUSCULAR | Status: AC
  Filled 2014-08-08: qty 2

## 2014-08-08 MED ORDER — ASPIRIN EC 325 MG PO TBEC: 325.0000 mg | DELAYED_RELEASE_TABLET | Freq: Every day | ORAL | Status: DC

## 2014-08-08 MED ORDER — MIDAZOLAM HCL 2 MG/2ML IJ SOLN
INTRAMUSCULAR | Status: AC
  Filled 2014-08-08: qty 2

## 2014-08-08 MED ORDER — HYDROCODONE-ACETAMINOPHEN 7.5-325 MG PO TABS
1.0000 | ORAL_TABLET | Freq: Four times a day (QID) | ORAL | Status: DC | PRN
Start: 2014-08-08 — End: 2014-08-11
  Administered 2014-08-08 – 2014-08-10 (×2): 1 via ORAL
  Filled 2014-08-08 (×2): qty 1

## 2014-08-08 MED ORDER — HYDROMORPHONE HCL 1 MG/ML IJ SOLN
0.5000 mg | INTRAMUSCULAR | Status: DC | PRN
  Administered 2014-08-08 – 2014-08-09 (×4): 0.5 mg via INTRAVENOUS
  Filled 2014-08-08 (×4): qty 1

## 2014-08-08 MED ORDER — MIDAZOLAM HCL 2 MG/2ML IJ SOLN
1.0000 mg | INTRAMUSCULAR | Status: DC | PRN
  Administered 2014-08-08: 2 mg via INTRAVENOUS

## 2014-08-08 MED ORDER — SENNA 8.6 MG PO TABS
1.0000 | ORAL_TABLET | Freq: Two times a day (BID) | ORAL | Status: DC
  Administered 2014-08-08 – 2014-08-10 (×6): 8.6 mg via ORAL
  Filled 2014-08-08 (×7): qty 1

## 2014-08-08 MED ORDER — FENTANYL CITRATE 0.05 MG/ML IJ SOLN: 25.0000 ug | INTRAMUSCULAR | Status: DC | PRN

## 2014-08-08 MED ORDER — CEFAZOLIN SODIUM-DEXTROSE 2-3 GM-% IV SOLR
2.0000 g | INTRAVENOUS | Status: AC
  Administered 2014-08-08: 2 g via INTRAVENOUS
  Filled 2014-08-08: qty 50

## 2014-08-08 MED ORDER — BUPIVACAINE LIPOSOME 1.3 % IJ SUSP
INTRAMUSCULAR | Status: AC
  Filled 2014-08-08: qty 20

## 2014-08-08 MED ORDER — FENTANYL CITRATE 0.05 MG/ML IJ SOLN
25.0000 ug | INTRAMUSCULAR | Status: AC
  Administered 2014-08-08 (×2): 25 ug via INTRAVENOUS

## 2014-08-08 MED ORDER — BUPIVACAINE IN DEXTROSE 0.75-8.25 % IT SOLN
INTRATHECAL | Status: DC | PRN
  Administered 2014-08-08: 15 mg via INTRATHECAL

## 2014-08-08 MED ORDER — ASPIRIN EC 325 MG PO TBEC
325.0000 mg | DELAYED_RELEASE_TABLET | Freq: Two times a day (BID) | ORAL | Status: DC
  Administered 2014-08-08 – 2014-08-11 (×6): 325 mg via ORAL
  Filled 2014-08-08 (×6): qty 1

## 2014-08-08 MED ORDER — PHENOL 1.4 % MT LIQD: 1.0000 | OROMUCOSAL | Status: DC | PRN

## 2014-08-08 MED ORDER — CEFAZOLIN SODIUM-DEXTROSE 2-3 GM-% IV SOLR
2.0000 g | Freq: Four times a day (QID) | INTRAVENOUS | Status: AC
  Administered 2014-08-08 (×2): 2 g via INTRAVENOUS
  Filled 2014-08-08 (×2): qty 50

## 2014-08-08 MED ORDER — ACETAMINOPHEN 325 MG PO TABS: 650.0000 mg | ORAL_TABLET | Freq: Four times a day (QID) | ORAL | Status: DC | PRN

## 2014-08-08 MED ORDER — LOSARTAN POTASSIUM 50 MG PO TABS
100.0000 mg | ORAL_TABLET | Freq: Every day | ORAL | Status: DC
  Administered 2014-08-09 – 2014-08-11 (×3): 100 mg via ORAL
  Filled 2014-08-08 (×4): qty 2

## 2014-08-08 MED ORDER — HYDROCHLOROTHIAZIDE 12.5 MG PO CAPS
12.5000 mg | ORAL_CAPSULE | Freq: Every day | ORAL | Status: DC
  Administered 2014-08-09 – 2014-08-11 (×3): 12.5 mg via ORAL
  Filled 2014-08-08 (×4): qty 1

## 2014-08-08 MED ORDER — METHOCARBAMOL 1000 MG/10ML IJ SOLN
500.0000 mg | Freq: Once | INTRAVENOUS | Status: AC
  Administered 2014-08-08: 500 mg via INTRAVENOUS
  Filled 2014-08-08: qty 5

## 2014-08-08 MED ORDER — SODIUM CHLORIDE 0.9 % IJ SOLN
INTRAMUSCULAR | Status: AC
  Filled 2014-08-08: qty 10

## 2014-08-08 MED ORDER — BUPIVACAINE-EPINEPHRINE (PF) 0.5% -1:200000 IJ SOLN
INTRAMUSCULAR | Status: AC
  Filled 2014-08-08: qty 30

## 2014-08-08 MED ORDER — ONDANSETRON HCL 4 MG PO TABS
4.0000 mg | ORAL_TABLET | Freq: Four times a day (QID) | ORAL | Status: DC | PRN
  Administered 2014-08-08: 4 mg via ORAL
  Filled 2014-08-08: qty 1

## 2014-08-08 MED ORDER — ONDANSETRON HCL 4 MG/2ML IJ SOLN
4.0000 mg | Freq: Once | INTRAMUSCULAR | Status: AC
  Administered 2014-08-08: 4 mg via INTRAVENOUS

## 2014-08-08 MED ORDER — DIPHENHYDRAMINE HCL 12.5 MG/5ML PO ELIX: 12.5000 mg | ORAL_SOLUTION | ORAL | Status: DC | PRN

## 2014-08-08 MED ORDER — OXYCODONE HCL 5 MG PO TABS
5.0000 mg | ORAL_TABLET | Freq: Once | ORAL | Status: AC
Start: 2014-08-08 — End: 2014-08-08
  Administered 2014-08-08: 5 mg via ORAL
  Filled 2014-08-08: qty 1

## 2014-08-08 MED ORDER — BUPIVACAINE-EPINEPHRINE (PF) 0.5% -1:200000 IJ SOLN
INTRAMUSCULAR | Status: DC | PRN
  Administered 2014-08-08: 60 mL

## 2014-08-08 MED ORDER — BISACODYL 10 MG RE SUPP: 10.0000 mg | Freq: Every day | RECTAL | Status: DC | PRN

## 2014-08-08 MED ORDER — SODIUM CHLORIDE 0.9 % IV SOLN
INTRAVENOUS | Status: DC
  Administered 2014-08-08 (×2): via INTRAVENOUS

## 2014-08-08 MED ORDER — PRAVASTATIN SODIUM 40 MG PO TABS
40.0000 mg | ORAL_TABLET | Freq: Every day | ORAL | Status: DC
  Administered 2014-08-09 – 2014-08-11 (×3): 40 mg via ORAL
  Filled 2014-08-08 (×3): qty 1

## 2014-08-08 MED ORDER — EPHEDRINE SULFATE 50 MG/ML IJ SOLN
INTRAMUSCULAR | Status: AC
Start: 2014-08-08 — End: 2014-08-08
  Filled 2014-08-08: qty 1

## 2014-08-08 MED ORDER — ACETAMINOPHEN 650 MG RE SUPP: 650.0000 mg | Freq: Four times a day (QID) | RECTAL | Status: DC | PRN

## 2014-08-08 MED ORDER — MIDAZOLAM HCL 5 MG/5ML IJ SOLN
INTRAMUSCULAR | Status: DC | PRN
  Administered 2014-08-08 (×2): 1 mg via INTRAVENOUS

## 2014-08-08 MED ORDER — PANTOPRAZOLE SODIUM 40 MG PO TBEC
80.0000 mg | DELAYED_RELEASE_TABLET | Freq: Every day | ORAL | Status: DC
  Administered 2014-08-09 – 2014-08-11 (×3): 80 mg via ORAL
  Filled 2014-08-08 (×4): qty 2

## 2014-08-08 MED ORDER — ONDANSETRON HCL 4 MG/2ML IJ SOLN
4.0000 mg | Freq: Four times a day (QID) | INTRAMUSCULAR | Status: DC | PRN
  Administered 2014-08-08 – 2014-08-09 (×2): 4 mg via INTRAVENOUS
  Filled 2014-08-08 (×2): qty 2

## 2014-08-08 MED ORDER — OXYCODONE HCL 5 MG PO TABS: 5.0000 mg | ORAL_TABLET | ORAL | Status: DC | PRN

## 2014-08-08 MED ORDER — MAGNESIUM CITRATE PO SOLN: 1.0000 | Freq: Once | ORAL | Status: AC | PRN

## 2014-08-08 MED ORDER — GABAPENTIN 300 MG PO CAPS
300.0000 mg | ORAL_CAPSULE | Freq: Two times a day (BID) | ORAL | Status: DC
  Administered 2014-08-08 – 2014-08-11 (×6): 300 mg via ORAL
  Filled 2014-08-08 (×7): qty 1

## 2014-08-08 MED ORDER — MENTHOL 3 MG MT LOZG: 1.0000 | LOZENGE | OROMUCOSAL | Status: DC | PRN

## 2014-08-08 MED ORDER — DIPHENHYDRAMINE HCL 25 MG PO CAPS: 50.0000 mg | ORAL_CAPSULE | Freq: Every evening | ORAL | Status: DC | PRN

## 2014-08-08 MED ORDER — LORAZEPAM 1 MG PO TABS
1.0000 mg | ORAL_TABLET | Freq: Every evening | ORAL | Status: DC | PRN
Start: 2014-08-08 — End: 2014-08-11

## 2014-08-08 MED ORDER — METOCLOPRAMIDE HCL 5 MG/ML IJ SOLN: 5.0000 mg | Freq: Three times a day (TID) | INTRAMUSCULAR | Status: DC | PRN

## 2014-08-08 MED ORDER — POLYETHYLENE GLYCOL 3350 17 G PO PACK
17.0000 g | PACK | Freq: Every day | ORAL | Status: DC
  Administered 2014-08-08 – 2014-08-10 (×3): 17 g via ORAL
  Filled 2014-08-08 (×4): qty 1

## 2014-08-08 MED ORDER — LOSARTAN POTASSIUM-HCTZ 100-12.5 MG PO TABS: 1.0000 | ORAL_TABLET | Freq: Every day | ORAL | Status: DC

## 2014-08-08 MED ORDER — METHOCARBAMOL 500 MG PO TABS: 500.0000 mg | ORAL_TABLET | Freq: Four times a day (QID) | ORAL | Status: DC | PRN

## 2014-08-08 MED ORDER — METOCLOPRAMIDE HCL 10 MG PO TABS: 5.0000 mg | ORAL_TABLET | Freq: Three times a day (TID) | ORAL | Status: DC | PRN

## 2014-08-08 MED ORDER — DOCUSATE SODIUM 100 MG PO CAPS
100.0000 mg | ORAL_CAPSULE | Freq: Two times a day (BID) | ORAL | Status: DC
  Administered 2014-08-08 – 2014-08-10 (×6): 100 mg via ORAL
  Filled 2014-08-08 (×7): qty 1

## 2014-08-08 MED ORDER — SODIUM CHLORIDE 0.9 % IJ SOLN
INTRAMUSCULAR | Status: AC
Start: 2014-08-08 — End: 2014-08-08
  Filled 2014-08-08: qty 40

## 2014-08-08 SURGICAL SUPPLY — 70 items
BAG HAMPER (MISCELLANEOUS) ×2 IMPLANT
BANDAGE ESMARK 6X9 LF (GAUZE/BANDAGES/DRESSINGS) ×1 IMPLANT
BIT DRILL 3.2X128 (BIT) IMPLANT
BLADE HEX COATED 2.75 (ELECTRODE) ×2 IMPLANT
BLADE SAG 18X100X1.27 (BLADE) IMPLANT
BLADE SAGITTAL 25.0X1.27X90 (BLADE) ×1 IMPLANT
BLADE SAW SAG 90X13X1.27 (BLADE) ×1 IMPLANT
BNDG CMPR 9X6 STRL LF SNTH (GAUZE/BANDAGES/DRESSINGS) ×1
BNDG ESMARK 6X9 LF (GAUZE/BANDAGES/DRESSINGS) ×2
BOWL SMART MIX CTS (DISPOSABLE) IMPLANT
CAPT FB KNEE W/COCR XLINK ×1 IMPLANT
CATH FOLEY LATEX FREE 16FR (CATHETERS) ×2
CATH FOLEY LF 16FR (CATHETERS) IMPLANT
CEMENT HV SMART SET (Cement) ×4 IMPLANT
CLOTH BEACON ORANGE TIMEOUT ST (SAFETY) ×2 IMPLANT
COVER LIGHT HANDLE STERIS (MISCELLANEOUS) ×8 IMPLANT
COVER PROBE W GEL 5X96 (DRAPES) ×2 IMPLANT
CUFF TOURNIQUET SINGLE 34IN LL (TOURNIQUET CUFF) IMPLANT
CUFF TOURNIQUET SINGLE 44IN (TOURNIQUET CUFF) ×1 IMPLANT
DECANTER SPIKE VIAL GLASS SM (MISCELLANEOUS) ×4 IMPLANT
DRAPE BACK TABLE (DRAPES) ×2 IMPLANT
DRAPE EXTREMITY T 121X128X90 (DRAPE) ×2 IMPLANT
DRSG MEPILEX BORDER 4X12 (GAUZE/BANDAGES/DRESSINGS) ×2 IMPLANT
DURAPREP 26ML APPLICATOR (WOUND CARE) ×4 IMPLANT
ELECT REM PT RETURN 9FT ADLT (ELECTROSURGICAL) ×2
ELECTRODE REM PT RTRN 9FT ADLT (ELECTROSURGICAL) ×1 IMPLANT
EVACUATOR 3/16  PVC DRAIN (DRAIN) ×1
EVACUATOR 3/16 PVC DRAIN (DRAIN) ×1 IMPLANT
FACESHIELD LNG OPTICON STERILE (SAFETY) IMPLANT
GLOVE OPTIFIT SS 8.0 STRL (GLOVE) ×2 IMPLANT
GLOVE SKINSENSE NS SZ8.0 LF (GLOVE) ×3
GLOVE SKINSENSE STRL SZ8.0 LF (GLOVE) ×2 IMPLANT
GLOVE SS N UNI LF 8.5 STRL (GLOVE) ×2 IMPLANT
GOWN STRL REUS W/TWL LRG LVL3 (GOWN DISPOSABLE) ×8 IMPLANT
GOWN STRL REUS W/TWL XL LVL3 (GOWN DISPOSABLE) ×2 IMPLANT
HANDPIECE INTERPULSE COAX TIP (DISPOSABLE) ×2
HOOD W/PEELAWAY (MISCELLANEOUS) ×8 IMPLANT
INST SET MAJOR BONE (KITS) ×2 IMPLANT
IV NS IRRIG 3000ML ARTHROMATIC (IV SOLUTION) ×2 IMPLANT
KIT BLADEGUARD II DBL (SET/KITS/TRAYS/PACK) ×2 IMPLANT
KIT ROOM TURNOVER APOR (KITS) ×2 IMPLANT
MANIFOLD NEPTUNE II (INSTRUMENTS) ×2 IMPLANT
MARKER SKIN DUAL TIP RULER LAB (MISCELLANEOUS) ×2 IMPLANT
NDL HYPO 21X1.5 SAFETY (NEEDLE) ×1 IMPLANT
NDL HYPO 25X1 1.5 SAFETY (NEEDLE) ×1 IMPLANT
NEEDLE HYPO 21X1.5 SAFETY (NEEDLE) ×2 IMPLANT
NEEDLE HYPO 25X1 1.5 SAFETY (NEEDLE) IMPLANT
NS IRRIG 1000ML POUR BTL (IV SOLUTION) ×2 IMPLANT
PACK TOTAL JOINT (CUSTOM PROCEDURE TRAY) ×2 IMPLANT
PAD ARMBOARD 7.5X6 YLW CONV (MISCELLANEOUS) ×2 IMPLANT
PAD DANNIFLEX CPM (ORTHOPEDIC SUPPLIES) ×2 IMPLANT
PIN TROCAR 3 INCH (PIN) ×1 IMPLANT
SAW OSC TIP CART 19.5X105X1.3 (SAW) ×1 IMPLANT
SET BASIN LINEN APH (SET/KITS/TRAYS/PACK) ×2 IMPLANT
SET HNDPC FAN SPRY TIP SCT (DISPOSABLE) ×1 IMPLANT
SPONGE DRAIN TRACH 4X4 STRL 2S (GAUZE/BANDAGES/DRESSINGS) ×2 IMPLANT
STAPLER VISISTAT 35W (STAPLE) ×3 IMPLANT
SUT BRALON NAB BRD #1 30IN (SUTURE) ×3 IMPLANT
SUT MON AB 0 CT1 (SUTURE) ×3 IMPLANT
SUT MON AB 2-0 CT1 36 (SUTURE) ×1 IMPLANT
SYR 20CC LL (SYRINGE) ×2 IMPLANT
SYR 30ML LL (SYRINGE) ×2 IMPLANT
SYR BULB IRRIGATION 50ML (SYRINGE) ×2 IMPLANT
TAPE CLOTH SURG 4X10 WHT LF (GAUZE/BANDAGES/DRESSINGS) ×1 IMPLANT
TAPE MEDIFIX FOAM 3 (GAUZE/BANDAGES/DRESSINGS) ×1 IMPLANT
TOWEL OR 17X26 4PK STRL BLUE (TOWEL DISPOSABLE) ×2 IMPLANT
TOWER CARTRIDGE SMART MIX (DISPOSABLE) ×2 IMPLANT
WATER STERILE IRR 1000ML POUR (IV SOLUTION) ×8 IMPLANT
YANKAUER SUCT 12FT TUBE ARGYLE (SUCTIONS) ×3 IMPLANT
YANKAUER SUCT BULB TIP 10FT TU (MISCELLANEOUS) ×1 IMPLANT

## 2014-08-08 NOTE — Progress Notes (Signed)
Physical Therapy Session Note  Patient Details  Name: Sara [name] MRN: [id number] Date of Birth: 1946-03-05  Today's Date: 08/08/2014 PT Individual Time:1650-1715      Short Term Goals: Week 1:     Skilled Therapeutic Interventions/Progress Updates:    Pt agreeable to try gait training upon therapist entrance.  Continues to complain of Lt knee numbness from knee to foot.  Pt able to demonstrate independent supine to sit with no assistance required.  Upon standing and walking 2 feet forward, pt c/o feeling "woozie", pt walked retro to sit of bed and sat on EOB where she got nauseas in trash can.  RN informed.  Min guard with Lt LE from sit to supine.  Ice applied to knee for pain control.  Therapy Documentation Precautions:  Precautions Precautions: Fall, Knee Precaution Comments: s/p Lt TKA 08/08/14 Restrictions Weight Bearing Restrictions: Yes LLE Weight Bearing: Weight bearing as tolerated Pain: Pain Assessment Pain Assessment: 0-10 Pain Score: 6  Pain Type: Surgical pain Pain Location: Knee Pain Orientation: Left Pain Descriptors / Indicators: Aching Pain Frequency: Constant Patients Stated Pain Goal: 0 Pain Intervention(s): Medication (See eMAR) Locomotion : Ambulation Ambulation Distance (Feet): 2 Feet   See FIM for current functional status  Therapy/Group: Individual Therapy  Aldona Lento 08/08/2014, 5:40 PM

## 2014-08-08 NOTE — Interval H&P Note (Signed)
History and Physical Interval Note:  08/08/2014 7:24 AM  Sara [name]  has presented today for surgery, with the diagnosis of OSTEOARTHRITIS LEFT KNEE  The various methods of treatment have been discussed with the patient and family. After consideration of risks, benefits and other options for treatment, the patient has consented to  Procedure(s): TOTAL KNEE ARTHROPLASTY (Left) as a surgical intervention .  The patient's history has been reviewed, patient examined, no change in status, stable for surgery.  I have reviewed the patient's chart and labs.  Questions were answered to the patient's satisfaction.     Arther Abbott

## 2014-08-08 NOTE — Op Note (Signed)
08/08/2014  10:01 AM  PATIENT:  Sara [name]  68 y.o. female  PRE-OPERATIVE DIAGNOSIS:  OSTEOARTHRITIS LEFT KNEE  POST-OPERATIVE DIAGNOSIS:  OSTEOARTHRITIS LEFT KNEE  FINDINGS: SEVERE DEGENERATION OF CARTILAGE MEDIAL COMPARTMENT DOWN TO BONE. aLL OSTEOPHYTES MILD VARUS.  PROCEDURE:  Procedure(s): TOTAL KNEE ARTHROPLASTY (Left)   IMPLANTS: DEPUY 2.5 FEMUR, 2.5 TIBIA, 10 MM POLYETHYLENE POSTERIOR STABILIZED INSERT AND 35 PATELLA  SURGEON:  Surgeon(s) and Role:    * Carole Civil, MD - Primary  PHYSICIAN ASSISTANT:   ASSISTANTS: betty ashley and nicole small   ANESTHESIA:   spinal  EBL:  Total I/O In: 1200 [I.V.:1200] Out: -   BLOOD ADMINISTERED:none  DRAINS:1 hemovac   LOCAL MEDICATIONS USED:  MARCAINE    and Amount: 60 ml  SPECIMEN:  No Specimen  DISPOSITION OF SPECIMEN:  N/A  COUNTS:  YES  TOURNIQUET:   Total Tourniquet Time Documented: Thigh (Left) - 89 minutes Total: Thigh (Left) - 89 minutes   DICTATION: .Viviann Spare Dictation  PLAN OF CARE: Admit to inpatient   PATIENT DISPOSITION:  PACU - hemodynamically stable.   Delay start of Pharmacological VTE agent (>24hrs) due to surgical blood loss or risk of bleeding: yes  The patient was identified by 2 approved identification mechanism. The operative extremity was evaluated and found to be acceptable for surgical treatment today. The chart was reviewed. The surgical site was confirmed and surgeon's initials were placed over the left knee  The patient was taken to the operating room and given a follow antibiotic 2 g of Ancef. This is consistent with the SCIP medications.  The patient was given the following anesthetic: spinal  The patient was then placed supine on the operating table. A Foley catheter was inserted. The operative extremity (left knee) was prepped and draped sterilely from the toes to the groin.  Timeout procedure was executed confirming the patient's name, surgical site, antibiotic  administration, x-rays available, and implants available.  The operative limb (left knee),  was exsanguinated with a six-inch Esmarch and the tourniquet was inflated to 300 mmHg.  A straight midline incision was made and taken down to the extensor mechanism. A medial arthrotomy was performed. The patella was everted and the patellofemoral ligament was released. The anterior cruciate ligament and PCL were resected.  The anterior horns of the lateral and medial meniscus were resected. The medial soft tissue sleeve was elevated to the mid coronal plane.  A three-eighths inch drill bit was used to enter the femoral canal which was decompressed with suction and irrigation until clear. The distal femoral cutting guide was set for 11 mm distal resection,  5valgus alignment, for a left knee. The distal femur was resected and checked for flatness.  The Depuy Sigma sizing femoral guide was placed and the femur was sized to a size 2.5 . A 4-in-1 cutting block was placed along with collateral ligament retractors and the distal femoral cuts were completed.  The external alignment guide for the tibial resection was then applied to the distal and proximal tibia and set for anatomic slope along with 8 MM resection  from the  lateral .   Rotational alignment was set using the malleolus, the tibial tubercle and the tibial spines.  The proximal tibia was resected residual menisci were removed. The tibia was sized using a base plate to a size  2.5 .   Spacer blocks were used to confirm equal flexion extension gaps with releases done as needed. A size 10 MM spacer block gave  equal stability and flexion extension.  The notch cutting guide for the femur was then applied and the notch cut was made.  Trial reduction was completed using 2.5 femur and 2.5 tibial, 10 mm insert trial implants. Patella packing was normal  We then skeletonized the patella. It measured 20 in thickness and the patellar resection was set for  14 millimeters. the patellar resection was completed. The patella diameter measured 35. And thickness was 14 mm; We then drilled the peg holes for the patella using the 35 patellar paddle which restored the patellar thickness to 22.5 mm  The proximal tibia was then punched using the size 2.5 base plate.  Thorough irrigation was performed and the bone was dried and prepared for cement. The cement was mixed on the back table using third generation preparation techniques  60 cc Marcaine with epinephrine was injected into the soft tissues including the posterior capsule.  The implants was then cemented in place and excess cement was removed. The cement was allowed to cure. Irrigation was repeated and excess and residual bone fragments and cement were removed.  A medium-size Hemovac was placed in the joint with 6 openings in the tube left in the joint.  The extensor mechanism was closed with #1 Bralon suture followed by subcutaneous tissue closure using 0 Monocryl suture  Skin approximation was performed using staples  A sterile dressing was applied using Mepilex dressing followed by application of a long TED hose  The patient was taken recovery room in stable condition

## 2014-08-08 NOTE — Evaluation (Signed)
Physical Therapy Evaluation Patient Details Name: Sara [name] MRN: [id number] DOB: Jun 04, 1946 Today's Date: 08/08/2014   History of Present Illness  Vetta B Charpentier, 68 y.o. female, has a history of pain and functional disability in the left knee due to arthritis and has failed non-surgical conservative treatments for greater than 12 weeks to includeNSAID's and/or analgesics, corticosteriod injections, flexibility and strengthening excercises, supervised PT with diminished ADL's post treatment, use of assistive devices, activity modification. Patient has night pain, worsening of pain with activity and weight bearing, pain that interferes with activities of daily living, pain with passive range of motion, crepitus and joint swelling.  Patient has evidence of subchondral sclerosis, periarticular osteophytes and joint space narrowing by imaging studies. This patient has had non operative measures . There is no active infection.  Lt TKA performed on 11/18/5, with MD orders for WBAT.    Clinical Impression  Pt is a 68 year old female who presents to PT s/p Lt TKA on 08/08/14; MD orders for WBAT.  Pt reports little pain in the Lt knee, though per RN pt was just given pain medication, though numbness reported in lt foot.  Sensation testing performed with eyes closed with pt able to feel from Lt foot to Lt knee during sensation assessment, though pt does report it feels "different" when compared to [place].  During evaluation, pt required min guard assist of Lt LE for bed mobility skills and min assist and use of RW for transfer to stand.  VC required for technique to complete transfers.  Once in standing, pt continues to report numbness in Lt foot, though was able to march both Lt and Rt LE in the air.  Pt did not feel ready to attempt gait secondary to numbness; will re-attempt as able to make appropriate recommendation for discharge.   Pt does report she had a Rt TKA 01/2014 and was able to return home with  HHPT and family assist and would like to do that again this time if able.  Recommend continued PT to address strengthening, activity tolerance, and balance for improved functional mobilty skills.  Will update d/c recommendation as able.       Follow Up Recommendations  (Unsure; will update recommendations as able. )    Equipment Recommendations  None recommended by PT       Precautions / Restrictions Precautions Precautions: Fall;Knee Precaution Comments: s/p Lt TKA 08/08/14 Restrictions Weight Bearing Restrictions: Yes LLE Weight Bearing: Weight bearing as tolerated      Mobility  Bed Mobility Overal bed mobility: Needs Assistance Bed Mobility: Supine to Sit;Sit to Supine     Supine to sit: Min guard Sit to supine: Min guard   General bed mobility comments: Min guard for movement of Lt LE  Transfers Overall transfer level: Needs assistance Equipment used: Rolling walker (2 wheeled) Transfers: Sit to/from Stand Sit to Stand: Min assist         General transfer comment: VC for technique for hand and foot placement.   Ambulation/Gait             General Gait Details: Not assessed.      Balance Overall balance assessment: Needs assistance Sitting-balance support: Feet supported;Single extremity supported Sitting balance-Leahy Scale: Good     Standing balance support: Bilateral upper extremity supported;During functional activity Standing balance-Leahy Scale: Fair  Pertinent Vitals/Pain Pain Assessment: 0-10 Pain Score: 1  Pain Location: Lt knee Pain Descriptors / Indicators: Aching Pain Intervention(s): Limited activity within patient's tolerance;Premedicated before session;Repositioned;Ice applied    Home Living Family/patient expects to be discharged to:: Unsure Living Arrangements: Spouse/significant other;Other relatives (Husband and mother in law) Available Help at Discharge: Family;Available 24  hours/day Type of Home: House Home Access: Stairs to enter Entrance Stairs-Rails: None Entrance Stairs-Number of Steps: 2 steps from garage, 1 step from back door Home Layout: One level Home Equipment: [place] - 2 wheels;Cane - single point;Bedside commode;Grab bars - tub/shower;Grab bars - toilet;Shower seat 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      Comments: Pt reports she has not been driving "for a while"     Hand Dominance   Dominant Hand: Right    Extremity/Trunk Assessment               Lower Extremity Assessment: LLE deficits/detail   LLE Deficits / Details: knee flexion 80 degrees.  Pt reports foot is "numb", though during sensation assessment, pt could feel light touch along whole Lt leg; pt able to wiggle toes, DF/PF ankle, and complete heel slides.       Communication   Communication: No difficulties  Cognition Arousal/Alertness: Awake/alert Behavior During Therapy: WFL for tasks assessed/performed Overall Cognitive Status: Within Functional Limits for tasks assessed                         Exercises General Exercises - Lower Extremity Ankle Circles/Pumps: AROM;Both;10 reps;Supine Heel Slides: AROM;5 reps;Supine      Assessment/Plan    PT Assessment Patient needs continued PT services  PT Diagnosis Difficulty walking;Generalized weakness   PT Problem List Decreased strength;Decreased range of motion;Decreased activity tolerance;Decreased balance;Decreased mobility  PT Treatment Interventions DME instruction;Gait training;Stair training;Functional mobility training;Therapeutic activities;Therapeutic exercise;Balance training;Neuromuscular re-education;Patient/family education   PT Goals (Current goals can be  found in the Care Plan section) Acute Rehab PT Goals Patient Stated Goal: go home PT Goal Formulation: With patient Time For Goal Achievement: 08/22/14 Potential to Achieve Goals: Good    Frequency Min 6X/week    End of Session Equipment Utilized During Treatment: Gait belt Activity Tolerance: Other (comment) (Limited by "numb" feeling in lt foot) Patient left: in bed;with call bell/phone within reach;with bed alarm set;with family/visitor present Nurse Communication: Mobility status         Time: 1254-1320 PT Time Calculation (min) (ACUTE ONLY): 26 min   Charges:   PT Evaluation $Initial PT Evaluation Tier I: 1 Procedure PT Treatments $Self Care/Home Management: 8-22 (Educated pt on technique for bed mobilty/transfers, HEP for heel slides and ankle pumpts)   Raynor Calcaterra 08/08/2014, 3:02 PM

## 2014-08-08 NOTE — Anesthesia Preprocedure Evaluation (Signed)
Anesthesia Evaluation  Patient identified by MRN, date of birth, ID band Patient awake    Reviewed: Allergy & Precautions, H&P , NPO status , Patient's Chart, lab work & pertinent test results, reviewed documented beta blocker date and time   History of Anesthesia Complications Negative for: history of anesthetic complications  Airway Mallampati: II  TM Distance: >3 FB Neck ROM: Full    Dental  (+) Teeth Intact, Dental Advisory Given   Pulmonary neg shortness of breath, sleep apnea , former smoker,  breath sounds clear to auscultation  Pulmonary exam normal       Cardiovascular hypertension, Pt. on home beta blockers and Pt. on medications Rhythm:Regular Rate:Normal     Neuro/Psych  Headaches,  Neuromuscular disease    GI/Hepatic Neg liver ROS, hiatal hernia, PUD, GERD-  Medicated,  Endo/Other  Hypothyroidism Morbid obesity  Renal/GU negative Renal ROS     Musculoskeletal   Abdominal (+) + obese,   Peds  Hematology   Anesthesia Other Findings   Reproductive/Obstetrics                             Anesthesia Physical Anesthesia Plan  ASA: III  Anesthesia Plan: Spinal and MAC   Post-op Pain Management:    Induction: Intravenous  Airway Management Planned: Nasal Cannula  Additional Equipment:   Intra-op Plan:   Post-operative Plan:   Informed Consent: I have reviewed the patients History and Physical, chart, labs and discussed the procedure including the risks, benefits and alternatives for the proposed anesthesia with the patient or authorized representative who has indicated his/her understanding and acceptance.     Plan Discussed with:   Anesthesia Plan Comments: (Possible GOT if failed SAB discussed with pt and she agrees with plan.)        Anesthesia Quick Evaluation

## 2014-08-08 NOTE — Anesthesia Procedure Notes (Addendum)
Spinal Patient location during procedure: OR Start time: 08/08/2014 7:49 AM Staffing Resident/CRNA: Bernette Redbird J Preanesthetic Checklist Completed: patient identified, site marked, surgical consent, pre-op evaluation, timeout performed, IV checked, risks and benefits discussed and monitors and equipment checked Spinal Block Patient position: left lateral decubitus Prep: Betadine Patient monitoring: heart rate, cardiac monitor, continuous pulse ox and blood pressure Approach: left paramedian Location: L2-3 Injection technique: single-shot Needle Needle type: Spinocan  Needle gauge: 22 G Assessment Sensory level: T6 Additional Notes CSF brisk and clear: [id number]     02/2015  Fentanyl 25 mcg added to spinal

## 2014-08-08 NOTE — Anesthesia Postprocedure Evaluation (Signed)
  Anesthesia Post-op Note  Patient: Sara [name]  Procedure(s) Performed: Procedure(s): TOTAL KNEE ARTHROPLASTY (Left)  Patient Location: PACU  Anesthesia Type:Spinal  Level of Consciousness: awake, alert , oriented and patient cooperative  Airway and Oxygen Therapy: Patient Spontanous Breathing  Post-op Pain: 3 /10, mild  Post-op Assessment: Post-op Vital signs reviewed, Patient's Cardiovascular Status Stable, Respiratory Function Stable, Patent Airway, No signs of Nausea or vomiting, Adequate PO intake and No headache  Post-op Vital Signs: Reviewed and stable  Last Vitals:  Filed Vitals:   08/08/14 1005  BP: 122/63  Pulse: 71  Temp: 36.3 C  Resp:     Complications: No apparent anesthesia complications

## 2014-08-08 NOTE — Brief Op Note (Addendum)
08/08/2014  10:01 AM  PATIENT:  Sara [name]  68 y.o. female  PRE-OPERATIVE DIAGNOSIS:  OSTEOARTHRITIS LEFT KNEE  POST-OPERATIVE DIAGNOSIS:  OSTEOARTHRITIS LEFT KNEE  FINDINGS: SEVERE DEGENERATION OF CARTILAGE MEDIAL COMPARTMENT DOWN TO BONE. aLL OSTEOPHYTES MILD VARUS.  PROCEDURE:  Procedure(s): TOTAL KNEE ARTHROPLASTY (Left)   IMPLANTS: DEPUY 2.5 FEMUR, 2.5 TIBIA, 10 MM POLYETHYLENE POSTERIOR STABILIZED INSERT AND 35 PATELLA  SURGEON:  Surgeon(s) and Role:    * Carole Civil, MD - Primary  PHYSICIAN ASSISTANT:   ASSISTANTS: betty ashley and nicole small   ANESTHESIA:   spinal  EBL:  Total I/O In: 1200 [I.V.:1200] Out: -   BLOOD ADMINISTERED:none  DRAINS:1 hemovac   LOCAL MEDICATIONS USED:  MARCAINE    and Amount: 60 ml  SPECIMEN:  No Specimen  DISPOSITION OF SPECIMEN:  N/A  COUNTS:  YES  TOURNIQUET:   Total Tourniquet Time Documented: Thigh (Left) - 89 minutes Total: Thigh (Left) - 89 minutes   DICTATION: .Viviann Spare Dictation  PLAN OF CARE: Admit to inpatient   PATIENT DISPOSITION:  PACU - hemodynamically stable.   Delay start of Pharmacological VTE agent (>24hrs) due to surgical blood loss or risk of bleeding: yes  The patient was identified by 2 approved identification mechanism. The operative extremity was evaluated and found to be acceptable for surgical treatment today. The chart was reviewed. The surgical site was confirmed and surgeon's initials were placed over the left knee  The patient was taken to the operating room and given a follow antibiotic 2 g of Ancef. This is consistent with the SCIP medications.  The patient was given the following anesthetic: spinal  The patient was then placed supine on the operating table. A Foley catheter was inserted. The operative extremity (left knee) was prepped and draped sterilely from the toes to the groin.  Timeout procedure was executed confirming the patient's name, surgical site, antibiotic  administration, x-rays available, and implants available.  The operative limb (left knee),  was exsanguinated with a six-inch Esmarch and the tourniquet was inflated to 300 mmHg.  A straight midline incision was made and taken down to the extensor mechanism. A medial arthrotomy was performed. The patella was everted and the patellofemoral ligament was released. The anterior cruciate ligament and PCL were resected.  The anterior horns of the lateral and medial meniscus were resected. The medial soft tissue sleeve was elevated to the mid coronal plane.  A three-eighths inch drill bit was used to enter the femoral canal which was decompressed with suction and irrigation until clear. The distal femoral cutting guide was set for 11 mm distal resection,  5valgus alignment, for a left knee. The distal femur was resected and checked for flatness.  The Depuy Sigma sizing femoral guide was placed and the femur was sized to a size 2.5 . A 4-in-1 cutting block was placed along with collateral ligament retractors and the distal femoral cuts were completed.  The external alignment guide for the tibial resection was then applied to the distal and proximal tibia and set for anatomic slope along with 8 MM resection  from the  lateral .   Rotational alignment was set using the malleolus, the tibial tubercle and the tibial spines.  The proximal tibia was resected residual menisci were removed. The tibia was sized using a base plate to a size  2.5 .   Spacer blocks were used to confirm equal flexion extension gaps with releases done as needed. A size 10 MM spacer block gave  equal stability and flexion extension.  The notch cutting guide for the femur was then applied and the notch cut was made.  Trial reduction was completed using 2.5 femur and 2.5 tibial, 10 mm insert trial implants. Patella packing was normal  We then skeletonized the patella. It measured 20 in thickness and the patellar resection was set for  14 millimeters. the patellar resection was completed. The patella diameter measured 35. And thickness was 14 mm; We then drilled the peg holes for the patella using the 35 patellar paddle which restored the patellar thickness to 22.5 mm  The proximal tibia was then punched using the size 2.5 base plate.  Thorough irrigation was performed and the bone was dried and prepared for cement. The cement was mixed on the back table using third generation preparation techniques  60 cc Marcaine with epinephrine was injected into the soft tissues including the posterior capsule.  The implants was then cemented in place and excess cement was removed. The cement was allowed to cure. Irrigation was repeated and excess and residual bone fragments and cement were removed.  A medium-size Hemovac was placed in the joint with 6 openings in the tube left in the joint.  The extensor mechanism was closed with #1 Bralon suture followed by subcutaneous tissue closure using 0 Monocryl suture  Skin approximation was performed using staples  A sterile dressing was applied using Mepilex dressing followed by application of a long TED hose  The patient was taken recovery room in stable condition

## 2014-08-08 NOTE — Transfer of Care (Signed)
Immediate Anesthesia Transfer of Care Note  Patient: Sara [name]  Procedure(s) Performed: Procedure(s): TOTAL KNEE ARTHROPLASTY (Left)  Patient Location: PACU  Anesthesia Type:Spinal  Level of Consciousness: awake, alert , oriented and patient cooperative  Airway & Oxygen Therapy: Patient Spontanous Breathing and Patient connected to nasal cannula oxygen  Post-op Assessment: Report given to PACU RN, Post -op Vital signs reviewed and stable and Patient moving all extremities  Post vital signs: Reviewed and stable  Complications: No apparent anesthesia complications

## 2014-08-08 NOTE — Interval H&P Note (Signed)
History and Physical Interval Note:  08/08/2014 7:23 AM  Sara [name]  has presented today for surgery, with the diagnosis of OSTEOARTHRITIS LEFT KNEE  The various methods of treatment have been discussed with the patient and family. After consideration of risks, benefits and other options for treatment, the patient has consented to  Procedure(s): TOTAL KNEE ARTHROPLASTY (Left) as a surgical intervention .  The patient's history has been reviewed, patient examined, no change in status, stable for surgery.  I have reviewed the patient's chart and labs.  Questions were answered to the patient's satisfaction.     Arther Abbott

## 2014-08-08 NOTE — Plan of Care (Signed)
Problem: Acute Rehab PT Goals(only PT should resolve) Goal: Pt Will Go Supine/Side To Sit Outcome: Progressing Goal: Pt Will Go Sit To Supine/Side Outcome: Progressing Goal: Patient Will Transfer Sit To/From Stand Outcome: Progressing Goal: Pt Will Transfer Bed To Chair/Chair To Bed Outcome: Progressing Goal: Pt Will Ambulate Outcome: Not Progressing C/o nausea upon standing, able to ambulate 2 feet then sat down following c/o

## 2014-08-09 ENCOUNTER — Encounter (HOSPITAL_COMMUNITY): Payer: Self-pay | Admitting: Orthopedic Surgery

## 2014-08-09 LAB — TYPE AND SCREEN
ABO/RH(D): B POS
Antibody Screen: NEGATIVE
Unit division: 0
Unit division: 0

## 2014-08-09 LAB — BASIC METABOLIC PANEL
Anion gap: 10 (ref 5–15)
BUN: 8 mg/dL (ref 6–23)
CO2: 27 mEq/L (ref 19–32)
Calcium: 8.2 mg/dL — ABNORMAL LOW (ref 8.4–10.5)
Chloride: 99 mEq/L (ref 96–112)
Creatinine, Ser: 0.75 mg/dL (ref 0.50–1.10)
GFR calc Af Amer: >90 mL/min (ref >90)
GFR calc non Af Amer: 86 mL/min — ABNORMAL LOW (ref >90)
Glucose, Bld: 98 mg/dL (ref 70–99)
Potassium: 4 mEq/L (ref 3.7–5.3)
Sodium: 136 mEq/L — ABNORMAL LOW (ref 137–147)

## 2014-08-09 LAB — CBC
HCT: 35 % — ABNORMAL LOW (ref 36.0–46.0)
Hemoglobin: 11.8 g/dL — ABNORMAL LOW (ref 12.0–15.0)
MCH: 31.7 pg (ref 26.0–34.0)
MCHC: 33.7 g/dL (ref 30.0–36.0)
MCV: 94.1 fL (ref 78.0–100.0)
Platelets: 193 10*3/uL (ref 150–400)
RBC: 3.72 MIL/uL — ABNORMAL LOW (ref 3.87–5.11)
RDW: 13.3 % (ref 11.5–15.5)
WBC: 9.3 10*3/uL (ref 4.0–10.5)

## 2014-08-09 NOTE — Addendum Note (Signed)
Addendum  created 08/09/14 3276 by Charmaine Downs, CRNA   Modules edited: Notes Section   Notes Section:  File: [id number]

## 2014-08-09 NOTE — Progress Notes (Signed)
OT Cancellation Note  Patient Details Name: Sara [name] MRN: [id number] DOB: March 19, 1946   Cancelled Treatment:    Reason Eval/Treat Not Completed: Patient not medically ready.  Pt with significant nausea this AM.  Requested hold on therapy at this time.  Will return for OT eval as able.  [name], MS, OTR/L [facility] [phone]  08/09/2014, 9:23 AM

## 2014-08-09 NOTE — Care Management Note (Signed)
    Page 1 of 2   08/10/2014     1:41:31 PM CARE MANAGEMENT NOTE 08/10/2014  Patient:  Sara [name], Sara [name]   Account Number:  [id number]  Date Initiated:  08/09/2014  Documentation initiated by:  Theophilus Kinds  Subjective/Objective Assessment:   Pt admitted from home s/p left knee. Pt lives with her husband and will return home at discharge. Pt has been fairly independent with ADls' prior to surgery. Pt has a rolling walker, BSC, and 3 N 1 for home use.     Action/Plan:   Pt has used [facility] in the past and would like to receive services from them again and wants CPM from [facility] as well. Will arrange [facility] and DME once orders written.   Anticipated DC Date:  08/11/2014   Anticipated DC Plan:  [place]  CM consult      [facility] Choice  DURABLE MEDICAL EQUIPMENT   Choice offered to / List presented to:  C-1 Patient   DME arranged  [facility]      DME agency  [place] arranged  [place].   Status of service:  Completed, signed off Medicare Important Message given?  YES (If response is "NO", the following Medicare IM given date fields will be blank) Date Medicare IM given:  08/10/2014 Medicare IM given by:  Theophilus Kinds Date Additional Medicare IM given:   Additional Medicare IM given by:    Discharge Disposition:  [place]  Per UR Regulation:    If discussed at Long Length of Stay Meetings, dates discussed:    Comments:  08/10/14 [place], RN BSN CM Anticipate discharge 08/11/14. HH PT arranged with [facility] (per pts choice). Romualdo Bolk of [facility] is aware and will collect the pts information from the chart. Kevin services to start within 48 hours of discharge. CPM arranged with [facility] and will be delivered to pts home at discharge. Weekend staff to call [facility] at discharge. Pt and pts nurse aware of discharge arrangements.  08/09/14 [place], RN  BSN CM

## 2014-08-09 NOTE — Anesthesia Postprocedure Evaluation (Signed)
  Anesthesia Post-op Note  Patient: Sara [name]  Procedure(s) Performed: Procedure(s): TOTAL KNEE ARTHROPLASTY (Left)  Patient Location: room 333  Anesthesia Type:Spinal  Level of Consciousness: awake, alert , oriented and patient cooperative  Airway and Oxygen Therapy: Patient Spontanous Breathing and Patient connected to nasal cannula oxygen  Post-op Pain: 5 /10, moderate  Post-op Assessment: Post-op Vital signs reviewed, Patient's Cardiovascular Status Stable, Respiratory Function Stable, Patent Airway, NAUSEA AND VOMITING PRESENT, Adequate PO intake and No headache  Post-op Vital Signs: Reviewed and stable  Last Vitals:  Filed Vitals:   08/09/14 0800  BP:   Pulse:   Temp:   Resp: 16    Complications: No apparent anesthesia complications

## 2014-08-09 NOTE — Clinical Social Work Note (Signed)
CSW received referral for possible SNF after TKA. PT evaluated pt yesterday and has also worked with pt today. Plan is for return home with home health. CSW will sign off, but can be reconsulted if needed.  Benay Pike, [place]

## 2014-08-09 NOTE — Progress Notes (Signed)
UR chart review completed.  

## 2014-08-09 NOTE — Progress Notes (Signed)
Patient ID: Sara [name], female   DOB: 12/25/1945, 68 y.o.   MRN: [id number] Subjective: 1 Day Post-Op Procedure(s) (LRB): TOTAL KNEE ARTHROPLASTY (Left) Patient reports pain as mild.    Objective: Vital signs in last 24 hours: Temp:  [95.9 F (35.5 C)-98.7 F (37.1 C)] 98.7 F (37.1 C) (11/19 0648) Pulse Rate:  [59-75] 72 (11/19 0648) Resp:  [12-16] 16 (11/19 0648) BP: (102-124)/(54-77) 105/64 mmHg (11/19 0648) SpO2:  [96 %-99 %] 97 % (11/19 0648)  Intake/Output from previous day: 11/18 0701 - 11/19 0700 In: 5424.4 [P.O.:1764; I.V.:3510.4; IV Piggyback:150] Out: 1700 [Urine:1200; Drains:450; Blood:50] Intake/Output this shift:     Recent Labs  08/09/14 0619  HGB 11.8*    Recent Labs  08/09/14 0619  WBC 9.3  RBC 3.72*  HCT 35.0*  PLT 193    Recent Labs  08/09/14 0619  NA 136*  K 4.0  CL 99  CO2 27  BUN 8  CREATININE 0.75  GLUCOSE 98  CALCIUM 8.2*   No results for input(s): LABPT, INR in the last 72 hours.  Her leg looks good she's got good dorsiflexion plantar flexion of the foot there is a little swelling around the knee. The drain has some output and nothing unexpected  Assessment/Plan: 1 Day Post-Op Procedure(s) (LRB): TOTAL KNEE ARTHROPLASTY (Left) Advance diet Up with therapy D/C IV fluids  Arther Abbott 08/09/2014, 7:32 AM

## 2014-08-09 NOTE — Progress Notes (Signed)
Physical Therapy Treatment Patient Details Name: Sara [name] MRN: [id number] DOB: 1946/07/21 Today's Date: 08/09/2014    History of Present Illness  Pt underwent left TKR on 08-07-14.    PT Comments      Pt was up in a chair for 2 hours and tolerated well.  She was instructed in transfers from sit to stand and sit to supine needing only min assist.  She was instructed in gait with a walker, able to ambulate 15' x1 then 35' x1, instruction given for correct sequencing.  Gait is much less labored today.  She was instructed in exercise per protocol, AA ROM of left knee =0-70 degrees.  She is now able to hold the knee in full extension isometrically  During the SAQ.  Pain is fairly well controlled but she did experience a very lightheaded sensation after gait.  BP was taken and found to be 115/65.  Her left knee was placed in the CPM with RN instructed in when to remove.  Pt was quite comfortable at the end of visit.   Follow Up Recommendations  Home health PT     Equipment Recommendations  None recommended by PT    Recommendations for Other Services  none     Precautions / Restrictions Precautions Precautions: Fall;Knee Precaution Comments: s/p Lt TKA 08/08/14 Restrictions Weight Bearing Restrictions: No LLE Weight Bearing: Weight bearing as tolerated    Mobility  Bed Mobility Overal bed mobility: Needs Assistance Bed Mobility: Supine to Sit       Sit to supine: Min assist      Transfers Overall transfer level: Needs assistance Equipment used: Rolling walker (2 wheeled) Transfers: Sit to/from Stand Sit to Stand: Min assist         General transfer comment: transfer now much less labored, instruction for anterior weight shift  Ambulation/Gait Ambulation/Gait assistance: Min assist Ambulation Distance (Feet): 35 Feet (15'x1) Assistive device: Rolling walker (2 wheeled) Gait Pattern/deviations: Decreased step length - right Gait velocity: velocity is  appropriate for day 1 post op   General Gait Details: gait is stable   Science writer    Modified Rankin (Stroke Patients Only)       Balance Overall balance assessment: Needs assistance   Sitting balance-Leahy Scale: Normal     Standing balance support: Bilateral upper extremity supported Standing balance-Leahy Scale: Good                      Cognition Arousal/Alertness: Awake/alert Behavior During Therapy: WFL for tasks assessed/performed Overall Cognitive Status: Within Functional Limits for tasks assessed                      Exercises Total Joint Exercises Ankle Circles/Pumps: AROM;Both;10 reps;Supine Quad Sets: AROM;Both;10 reps;Supine Short Arc Quad: AAROM;Left;10 reps;Supine (now able to hold isometrically) Heel Slides: AAROM;Left;10 reps;Supine Goniometric ROM: knee /rom left =0-70 degrees, AA    General Comments        Pertinent Vitals/Pain Pain Assessment: No/denies pain    Home Living                      Prior Function            PT Goals (current goals can now be found in the care plan section) Progress towards PT goals: Progressing toward goals    Frequency  7X/week    PT Plan Current plan  remains appropriate    Co-evaluation             End of Session Equipment Utilized During Treatment: Gait belt Activity Tolerance: Patient tolerated treatment well Patient left: in bed;in CPM;with call bell/phone within reach;with bed alarm set     Time: 1330-1413 PT Time Calculation (min) (ACUTE ONLY): 43 min  Charges:  $Gait Training: 8-22 mins $Therapeutic Exercise: 8-22 mins $Therapeutic Activity: 8-22 mins                    G Codes:      Sable Feil August 14, 2014, 9:52 PM

## 2014-08-09 NOTE — Progress Notes (Signed)
Physical Therapy Treatment Patient Details Name: Sara [name] MRN: [id number] DOB: Jan 13, 1946 Today's Date: 08/09/2014    History of Present Illness Sara [name], 68 y.o. female, has a history of pain and functional disability in the left knee due to arthritis and has failed non-surgical conservative treatments for greater than 12 weeks to includeNSAID's and/or analgesics, corticosteriod injections, flexibility and strengthening excercises, supervised PT with diminished ADL's post treatment, use of assistive devices, activity modification. Patient has night pain, worsening of pain with activity and weight bearing, pain that interferes with activities of daily living, pain with passive range of motion, crepitus and joint swelling.  Patient has evidence of subchondral sclerosis, periarticular osteophytes and joint space narrowing by imaging studies. This patient has had non operative measures . There is no active infection.  Lt TKA performed on 11/81/5, with MD orders for WBAT.      PT Comments    Pt was seen for morning therapy.  She was alert and very cooperative, pain level at a "5".  She was instructed in general knee protocol and started on TKR exercise per protocol.   Left knee ROM = 0-68 degrees, supine and AA.  She needs full assist with SAQ.   Min assist needed to transfer to EOB, mod assist to transfer to standing.  She was instructed in gait with a walker, WBAT.  She was able to ambulate 15' with min assist to instruct again in technique.  Stride was shortened, but otherwise quite stable.  She did become somewhat nauseated and faint at end of gait so she was fully reclined in the chair and given an icy cloth for her forehead.  She was probably experiencing some orthostatic hypotension although I did not check her BP.  Pain did increase to a "7" at end of tx, so RN was able to administer IV pain med.  A fresh ice pack was applied to her knee.  We will begin CPM later today.  I  anticipate that pt will be able to return home at d/c.  Follow Up Recommendations  Home health PT     Equipment Recommendations  None recommended by PT    Recommendations for Other Services  none     Precautions / Restrictions Precautions Precautions: Fall;Knee Restrictions Weight Bearing Restrictions: No LLE Weight Bearing: Weight bearing as tolerated    Mobility  Bed Mobility Overal bed mobility: Needs Assistance Bed Mobility: Supine to Sit     Supine to sit: Min assist;HOB elevated     General bed mobility comments: min assist to move LLE to edge of bed  Transfers Overall transfer level: Needs assistance Equipment used: Rolling walker (2 wheeled) Transfers: Sit to/from Stand Sit to Stand: Mod assist;From elevated surface         General transfer comment: transfer is very labored today, instruction given for correct hand placement and anterior weight shift as well as increased flexion of the right knee  Ambulation/Gait Ambulation/Gait assistance: Min assist Ambulation Distance (Feet): 15 Feet Assistive device: Rolling walker (2 wheeled) Gait Pattern/deviations: Decreased step length - left;Decreased stance time - right Gait velocity: velocity is appropriate for day 1 post op Gait velocity interpretation: Below normal speed for age/gender General Gait Details: pt was instructed in correct gait technique with visual demonstration   Stairs            Wheelchair Mobility    Modified Rankin (Stroke Patients Only)       Balance Overall balance assessment: Needs assistance Sitting-balance  support: No upper extremity supported Sitting balance-Leahy Scale: Normal       Standing balance-Leahy Scale: Fair                      Cognition Arousal/Alertness: Awake/alert Behavior During Therapy: WFL for tasks assessed/performed Overall Cognitive Status: Within Functional Limits for tasks assessed                      Exercises Total  Joint Exercises Ankle Circles/Pumps: AROM;Both;10 reps;Supine Quad Sets: AROM;Both;10 reps;Supine Short Arc Quad: AAROM;Left;10 reps;Supine Heel Slides: AAROM;Left;10 reps;Supine Goniometric ROM: knee ROM= 0-68 degrees AA left    General Comments        Pertinent Vitals/Pain Pain Score: 5  Pain Descriptors / Indicators: Aching Pain Intervention(s): Repositioned;Patient requesting pain meds-RN notified;RN gave pain meds during session;Ice applied    Home Living                      Prior Function            PT Goals (current goals can now be found in the care plan section) Progress towards PT goals: Progressing toward goals    Frequency  7X/week    PT Plan Discharge plan needs to be updated    Co-evaluation             End of Session Equipment Utilized During Treatment: Gait belt Activity Tolerance: Patient limited by pain;Other (comment) (nausea after gait) Patient left: in chair;with call bell/phone within reach;with nursing/sitter in room     Time: 1020-1112 PT Time Calculation (min) (ACUTE ONLY): 52 min  Charges:  $Gait Training: 8-22 mins $Therapeutic Exercise: 8-22 mins                    G Codes:      Sable Feil 2014-08-28, 11:17 AM

## 2014-08-10 LAB — CBC
HCT: 32.8 % — ABNORMAL LOW (ref 36.0–46.0)
Hemoglobin: 11.1 g/dL — ABNORMAL LOW (ref 12.0–15.0)
MCH: 31.7 pg (ref 26.0–34.0)
MCHC: 33.8 g/dL (ref 30.0–36.0)
MCV: 93.7 fL (ref 78.0–100.0)
Platelets: 207 10*3/uL (ref 150–400)
RBC: 3.5 MIL/uL — ABNORMAL LOW (ref 3.87–5.11)
RDW: 13.4 % (ref 11.5–15.5)
WBC: 10.3 10*3/uL (ref 4.0–10.5)

## 2014-08-10 NOTE — Progress Notes (Signed)
Physical Therapy Treatment Patient Details Name: Sara [name] MRN: [id number] DOB: 1946-07-13 Today's Date: 08/10/2014    History of Present Illness Sara [name], 68 y.o. female, has a history of pain and functional disability in the left knee due to arthritis and has failed non-surgical conservative treatments for greater than 12 weeks to includeNSAID's and/or analgesics, corticosteriod injections, flexibility and strengthening excercises, supervised PT with diminished ADL's post treatment, use of assistive devices, activity modification. Patient has night pain, worsening of pain with activity and weight bearing, pain that interferes with activities of daily living, pain with passive range of motion, crepitus and joint swelling.  Patient has evidence of subchondral sclerosis, periarticular osteophytes and joint space narrowing by imaging studies. This patient has had non operative measures . There is no active infection.  Lt TKA performed on 11/81/5, with MD orders for WBAT.      PT Comments    Pt continues to do extremely well.  Her left knee ROM is 0-95 degrees, she is able to transfer in and out of bed independently and she is ambulating 200' with an excellent gait pattern.  Pain is well controlled and she understands knee precautions.  Acute care PT goals have been met so we will d/c.  Follow Up Recommendations  Home health PT     Equipment Recommendations  None recommended by PT    Recommendations for Other Services  none     Precautions / Restrictions Precautions Precautions: Fall;Knee Precaution Comments: s/p Lt TKA 08/08/14 Restrictions Weight Bearing Restrictions: No LLE Weight Bearing: Weight bearing as tolerated    Mobility  Bed Mobility Overal bed mobility: Modified Independent       Supine to sit: Modified independent (Device/Increase time)        Transfers Overall transfer level: Modified independent Equipment used: Rolling walker (2 wheeled)    Sit to Stand: Modified independent (Device/Increase time)            Ambulation/Gait Ambulation/Gait assistance: Modified independent (Device/Increase time) Ambulation Distance (Feet): 200 Feet Assistive device: Rolling walker (2 wheeled)   Gait velocity: velocity is appropriate    General Gait Details: gait technique is nearly normal with almost full weight bearing   Stairs            Wheelchair Mobility    Modified Rankin (Stroke Patients Only)       Balance                                    Cognition Arousal/Alertness: Awake/alert Behavior During Therapy: WFL for tasks assessed/performed Overall Cognitive Status: Within Functional Limits for tasks assessed                      Exercises Total Joint Exercises Ankle Circles/Pumps: AROM;Both;10 reps;Supine Quad Sets: AROM;Both;10 reps;Supine Short Arc Quad: AROM;Left;10 reps;Supine Heel Slides: AAROM;Left;10 reps;Supine Goniometric ROM: left knee ROM= 0-95 degrees, AA    General Comments        Pertinent Vitals/Pain Pain Assessment: No/denies pain    Home Living                      Prior Function            PT Goals (current goals can now be found in the care plan section) Progress towards PT goals: Goals met/education completed, patient discharged from PT    Frequency  PT Plan Current plan remains appropriate    Co-evaluation             End of Session Equipment Utilized During Treatment: Gait belt Activity Tolerance: Patient tolerated treatment well Patient left: in bed;in CPM;with call bell/phone within reach;with bed alarm set     Time: 9373-4287 PT Time Calculation (min) (ACUTE ONLY): 36 min  Charges:  $Gait Training: 8-22 mins $Therapeutic Exercise: 8-22 mins                    G Codes:      Sable Feil 09-03-14, 4:21 PM

## 2014-08-10 NOTE — Evaluation (Signed)
Occupational Therapy Evaluation Patient Details Name: SEVEN MARENGO MRN: [id number] DOB: 07-Aug-1946 Today's Date: 08/10/2014    History of Present Illness Dorella B Bromwell, 68 y.o. female, has a history of pain and functional disability in the left knee due to arthritis and has failed non-surgical conservative treatments for greater than 12 weeks to includeNSAID's and/or analgesics, corticosteriod injections, flexibility and strengthening excercises, supervised PT with diminished ADL's post treatment, use of assistive devices, activity modification. Patient has night pain, worsening of pain with activity and weight bearing, pain that interferes with activities of daily living, pain with passive range of motion, crepitus and joint swelling.  Patient has evidence of subchondral sclerosis, periarticular osteophytes and joint space narrowing by imaging studies. This patient has had non operative measures . There is no active infection.  Lt TKA performed on 11/81/5, with MD orders for WBAT.     Clinical Impression   Pt is presenting to acute OT with above situation.  She has WFL strength, ROM, and coordination in BUE.  Pt demonstrated min assist/min guard skills with ADLs for safety.  Pt has no concerns about d/c home at this time, and will not require further OT services.    Follow Up Recommendations  No OT follow up    Equipment Recommendations  None recommended by OT    Recommendations for Other Services       Precautions / Restrictions Precautions Precautions: Fall;Knee Precaution Comments: s/p Lt TKA 08/08/14 Restrictions LLE Weight Bearing: Weight bearing as tolerated      Mobility Bed Mobility Overal bed mobility: Needs Assistance Bed Mobility: Supine to Sit     Supine to sit: Min assist;HOB elevated     General bed mobility comments: min assist to move LLE to edge of bed  Transfers Overall transfer level: Needs assistance Equipment used: Rolling walker (2  wheeled) Transfers: Sit to/from Stand Sit to Stand: Min assist              Balance                                            ADL Overall ADL's : Needs assistance/impaired Eating/Feeding: Modified independent   Grooming: Wash/dry face;Brushing hair;Min guard   Upper Body Bathing: Min guard;Standing               Toilet Transfer: Minimal assistance;RW;Grab bars;Comfort height toilet Toilet Transfer Details (indicate cue type and reason): Multiple attempts for sit>stand Toileting- Water quality scientist and Hygiene: Surveyor, quantity      Pertinent Vitals/Pain Pain Assessment: 0-10 Pain Score: 2  Pain Location: L knee Pain Descriptors / Indicators: Burning Pain Intervention(s): Limited activity within patient's tolerance;Monitored during session;RN gave pain meds during session;Repositioned     Hand Dominance Right   Extremity/Trunk Assessment Upper Extremity Assessment Upper Extremity Assessment: Overall WFL for tasks assessed   Lower Extremity Assessment Lower Extremity Assessment: Defer to PT evaluation       Communication Communication Communication: No difficulties   Cognition Arousal/Alertness: Awake/alert Behavior During Therapy: WFL for tasks assessed/performed Overall Cognitive Status: Within Functional Limits for tasks assessed  General Comments       Exercises       Shoulder Instructions      Home Living Family/patient expects to be discharged to:: Private residence Living Arrangements: Spouse/significant other;Other relatives Available Help at Discharge: Family;Available 24 hours/day Type of Home: House Home Access: Stairs to enter [employer] of Steps: 2 steps from garage, 1 step from back door   Home Layout: One level     Bathroom Shower/Tub: Teacher, early years/pre: Handicapped height      Home Equipment: Environmental consultant - 2 wheels;Cane - single point;Bedside commode;Grab bars - tub/shower;Grab bars - toilet;Shower seat          Prior Functioning/Environment Level of Independence: Independent;Needs assistance    ADL's / Homemaking Assistance Needed: pt reported some assist from husband in the past, but gorss independnece wiht ADLs prior to surgery.   Comments: Pt reports she has not been driving "for a while"    OT Diagnosis:     OT Problem List:     OT Treatment/Interventions:      OT Goals(Current goals can be found in the care plan section) Acute Rehab OT Goals Patient Stated Goal: No OT goals needed OT Goal Formulation: All assessment and education complete, DC therapy  OT Frequency:     Barriers to D/C:            Co-evaluation              End of Session Equipment Utilized During Treatment: Gait belt;Rolling walker  Activity Tolerance: Patient tolerated treatment well Patient left: in bed;with family/visitor present;with call bell/phone within reach   Time: 0837-0918 OT Time Calculation (min): 41 min Charges:  OT General Charges $OT Visit: 1 Procedure OT Evaluation $Initial OT Evaluation Tier I: 1 Procedure OT Treatments $Self Care/Home Management : 23-37 mins G-Codes:     Bea Graff Trey Bebee, MS, OTR/L [facility] 08/10/2014, 9:28 AM

## 2014-08-10 NOTE — Progress Notes (Signed)
Subjective: 2 Days Post-Op Procedure(s) (LRB): TOTAL KNEE ARTHROPLASTY (Left)   Objective:   Recent Labs  08/09/14 0619 08/10/14 0620  HGB 11.8* 11.1*    Neurologically intact ABD soft Neurovascular intact Sensation intact distally Intact pulses distally Dorsiflexion/Plantar flexion intact Incision: dressing C/D/I No cellulitis present Compartment soft  Assessment/Plan: 2 Days Post-Op Procedure(s) (LRB): TOTAL KNEE ARTHROPLASTY (Left) Advance diet Up with therapy Plan for discharge tomorrow Discharge home with home health  Arther Abbott 08/10/2014, 8:16 AM Pt underwent left TKR on 08-07-14.     PT Comments       Pt was up in a chair for 2 hours and tolerated well.  She was instructed in transfers from sit to stand and sit to supine needing only min assist.  She was instructed in gait with a walker, able to ambulate 15' x1 then 35' x1, instruction given for correct sequencing.  Gait is much less labored today.  She was instructed in exercise per protocol, AA ROM of left knee =0-70 degrees.  She is now able to hold the knee in full extension isometrically  During the SAQ.  Pain is fairly well controlled but she did experience a very lightheaded sensation after gait.  BP was taken and found to be 115/65.  Her left knee was placed in the CPM with RN instructed in when to remove.  Pt was quite comfortable at the end of visit.

## 2014-08-10 NOTE — Progress Notes (Signed)
Physical Therapy Treatment Patient Details Name: Sara [name] MRN: [id number] DOB: [birth year] Today's Date: 08/10/2014    History of Present Illness Xiadani B Decesare, 68 y.o. female, has a history of pain and functional disability in the left knee due to arthritis and has failed non-surgical conservative treatments for greater than 12 weeks to includeNSAID's and/or analgesics, corticosteriod injections, flexibility and strengthening excercises, supervised PT with diminished ADL's post treatment, use of assistive devices, activity modification. Patient has night pain, worsening of pain with activity and weight bearing, pain that interferes with activities of daily living, pain with passive range of motion, crepitus and joint swelling.  Patient has evidence of subchondral sclerosis, periarticular osteophytes and joint space narrowing by imaging studies. This patient has had non operative measures . There is no active infection.  Lt TKA performed on 11/81/5, with MD orders for WBAT.      PT Comments    Pt is doing exceedingly well.  Her pain is well controlled with oral meds and ROM of the left knee is 0-90 degrees, AA.  She is now transferring without assist and was able to ambulate 200' with nearly a normal gait pattern using a walker.  She was able to do a SAQ independently .  Follow Up Recommendations     HHPT   Equipment Recommendations     none  Recommendations for Other Services   none    Precautions / Restrictions Precautions Precautions: Fall;Knee Precaution Comments: s/p Lt TKA 08/08/14 Restrictions Weight Bearing Restrictions: No LLE Weight Bearing: Weight bearing as tolerated    Mobility  Bed Mobility Overal bed mobility: Modified Independent Bed Mobility: Supine to Sit     Supine to sit: Modified independent (Device/Increase time);HOB elevated     General bed mobility comments: now able to transfer LLE out of bed without assist  Transfers Overall transfer  level: Modified independent Equipment used: Rolling walker (2 wheeled) Transfers: Sit to/from Stand Sit to Stand: Supervision            Ambulation/Gait Ambulation/Gait assistance: Supervision Ambulation Distance (Feet): 200 Feet Assistive device: Rolling walker (2 wheeled) Gait Pattern/deviations: WFL(Within Functional Limits) Gait velocity: velocity is appropriate    General Gait Details: gait technique is nearly normal with almost full weight bearing   Stairs            Wheelchair Mobility    Modified Rankin (Stroke Patients Only)       Balance Overall balance assessment: Independent                                  Cognition Arousal/Alertness: Awake/alert Behavior During Therapy: WFL for tasks assessed/performed Overall Cognitive Status: Within Functional Limits for tasks assessed                      Exercises Total Joint Exercises Ankle Circles/Pumps: AROM;Both;10 reps;Supine Quad Sets: AROM;Both;10 reps;Supine Short Arc Quad: AROM;Left;10 reps;Supine Heel Slides: AAROM;Left;10 reps;Supine Goniometric ROM: L knee ROM=0-90 degrees, AA    General Comments        Pertinent Vitals/Pain Pain Assessment: 0-10 Pain Score: 2  Pain Location: L knee Pain Descriptors / Indicators: Aching Pain Intervention(s): Monitored during session;Repositioned;Ice applied    Home Living Family/patient expects to be discharged to:: Private residence Living Arrangements: Spouse/significant other;Other relatives Available Help at Discharge: Family;Available 24 hours/day Type of Home: House Home Access: Stairs to enter   Home Layout: One level  Home Equipment: Whitney - 2 wheels;Cane - single point;Bedside commode;Grab bars - tub/shower;Grab bars - toilet;Shower seat      Prior Function Level of Independence: Independent;Needs assistance    ADL's / Homemaking Assistance Needed: pt reported some assist from husband in the past, but gorss  independnece wiht ADLs prior to surgery. Comments: Pt reports she has not been driving "for a while"   PT Goals (current goals can now be found in the care plan section) Acute Rehab PT Goals Patient Stated Goal: No OT goals needed Progress towards PT goals: Progressing toward goals    Frequency       PT Plan Current plan remains appropriate    Co-evaluation             End of Session Equipment Utilized During Treatment: Gait belt Activity Tolerance: Patient tolerated treatment well Patient left: in chair;with call bell/phone within reach;with nursing/sitter in room     Time: 1124-1210 PT Time Calculation (min) (ACUTE ONLY): 46 min  Charges:  $Gait Training: 8-22 mins $Therapeutic Exercise: 8-22 mins                    G Codes:      Sable Feil 08/18/14, 12:16 PM

## 2014-08-11 LAB — CBC
HCT: 30.7 % — ABNORMAL LOW (ref 36.0–46.0)
Hemoglobin: 10.3 g/dL — ABNORMAL LOW (ref 12.0–15.0)
MCH: 31.5 pg (ref 26.0–34.0)
MCHC: 33.6 g/dL (ref 30.0–36.0)
MCV: 93.9 fL (ref 78.0–100.0)
Platelets: 204 10*3/uL (ref 150–400)
RBC: 3.27 MIL/uL — ABNORMAL LOW (ref 3.87–5.11)
RDW: 13.5 % (ref 11.5–15.5)
WBC: 10.3 10*3/uL (ref 4.0–10.5)

## 2014-08-11 MED ORDER — ASPIRIN 325 MG PO TBEC: 325.0000 mg | DELAYED_RELEASE_TABLET | Freq: Two times a day (BID) | ORAL | Status: DC

## 2014-08-11 MED ORDER — OXYCODONE-ACETAMINOPHEN 5-325 MG PO TABS: 1.0000 | ORAL_TABLET | ORAL | Status: DC

## 2014-08-11 MED ORDER — POLYETHYLENE GLYCOL 3350 17 G PO PACK: 17.0000 g | PACK | Freq: Every day | ORAL | Status: DC

## 2014-08-11 NOTE — Discharge Summary (Signed)
Physician Discharge Summary  Patient ID: Sara [name] MRN: [id number] DOB/AGE: 1946-03-01 68 y.o.  Admit date: 08/08/2014 Discharge date: 08/11/2014  Admission Diagnoses: oa left knee   Discharge Diagnoses: oa left knee Active Problems:   S/P total knee replacement   Discharged Condition: stable  Hospital Course: routine  11/18 left tka spinal uncomplicated [facility] PS  11/19 PT 11/20 PT  11/21 d/c  Discharge Exam: Blood pressure 107/55, pulse 70, temperature 98 F (36.7 C), temperature source Oral, resp. rate 18, height 5\' 6"  (1.676 m), weight 217 lb (98.431 kg), SpO2 95 %. Incision/Wound: clean no signs of infection   Disposition: 01-Home or Self Care  Discharge Instructions    Call MD / Call 911    Complete by:  As directed   If you experience chest pain or shortness of breath, CALL 911 and be transported to the hospital emergency room.  If you develope a fever above 101 F, pus (white drainage) or increased drainage or redness at the wound, or calf pain, call your surgeon's office.     Change dressing    Complete by:  As directed   Change dressing on Sunday if needed , then change the dressing daily with sterile 4 x 4 inch gauze dressing and apply TED hose.  You may clean the incision with alcohol prior to redressing.     Constipation Prevention    Complete by:  As directed   Drink plenty of fluids.  Prune juice may be helpful.  You may use a stool softener, such as Colace (over the counter) 100 mg twice a day.  Use MiraLax (over the counter) for constipation as needed.     Diet - low sodium heart healthy    Complete by:  As directed      Do not put a pillow under the knee. Place it under the heel.    Complete by:  As directed      Driving restrictions    Complete by:  As directed   No driving for 2 weeks     Increase activity slowly as tolerated    Complete by:  As directed      TED hose    Complete by:  As directed   Use stockings (TED hose) for 4 weeks  on both leg(s).  You may remove them at night for sleeping.            Medication List    STOP taking these medications        HYDROcodone-acetaminophen 7.5-325 MG per tablet  Commonly known as:  NORCO      TAKE these medications        aspirin 325 MG EC tablet  Take 1 tablet (325 mg total) by mouth 2 (two) times daily before a meal.     diphenhydrAMINE 25 MG tablet  Commonly known as:  SOMINEX  Take 50 mg by mouth at bedtime as needed for allergies or sleep.     fluticasone 50 MCG/ACT nasal spray  Commonly known as:  FLONASE  Place 2 sprays into the nose at bedtime.     gabapentin 600 MG tablet  Commonly known as:  NEURONTIN  1/2 tab during the day and one tab at bedtime     levothyroxine 88 MCG tablet  Commonly known as:  SYNTHROID, LEVOTHROID  Take 88 mcg by mouth daily before breakfast.     LORazepam 1 MG tablet  Commonly known as:  ATIVAN  Take 1 mg by  mouth at bedtime as needed for sleep. For sleep     losartan-hydrochlorothiazide 100-12.5 MG per tablet  Commonly known as:  HYZAAR  Take 1 tablet by mouth daily.     metoprolol tartrate 25 MG tablet  Commonly known as:  LOPRESSOR  Take 25 mg by mouth 2 (two) times daily.     omeprazole 40 MG capsule  Commonly known as:  PRILOSEC  Take 40 mg by mouth daily before breakfast.     oxyCODONE-acetaminophen 5-325 MG per tablet  Commonly known as:  PERCOCET/ROXICET  Take 1 tablet by mouth every 4 (four) hours.     polyethylene glycol packet  Commonly known as:  MIRALAX / GLYCOLAX  Take 17 g by mouth daily.     pravastatin 40 MG tablet  Commonly known as:  PRAVACHOL  Take 40 mg by mouth daily with breakfast.     PRESCRIPTION MEDICATION  Apply 1-2 Applicatorfuls topically 4 (four) times daily as needed (knee pain). Compounded cream with diclofenac and lidocaine for knee pain           Follow-up Information    Follow up with [facility].   Contact information:   [address] [place] [address] [phone]       Signed: Arther Abbott 08/11/2014, 9:53 AM

## 2014-08-11 NOTE — Progress Notes (Signed)
Patient with orders to be discharged home. Discharge instructions given, patient verbalized understanding. Prescriptions given. Patient stable. Patient left with spouse in private vehicle.

## 2014-08-13 ENCOUNTER — Ambulatory Visit (HOSPITAL_COMMUNITY): Payer: PRIVATE HEALTH INSURANCE

## 2014-08-20 ENCOUNTER — Encounter: Payer: Self-pay | Admitting: Orthopedic Surgery

## 2014-08-20 ENCOUNTER — Telehealth: Payer: Self-pay | Admitting: Orthopedic Surgery

## 2014-08-20 ENCOUNTER — Ambulatory Visit (INDEPENDENT_AMBULATORY_CARE_PROVIDER_SITE_OTHER): Payer: Self-pay | Admitting: Orthopedic Surgery

## 2014-08-20 VITALS — Resp 18 | Ht 66.0 in | Wt 217.0 lb

## 2014-08-20 DIAGNOSIS — Z96652 Presence of left artificial knee joint: Secondary | ICD-10-CM

## 2014-08-20 MED ORDER — OXYCODONE-ACETAMINOPHEN 10-325 MG PO TABS: 1.0000 | ORAL_TABLET | ORAL | Status: DC | PRN

## 2014-08-20 NOTE — Telephone Encounter (Signed)
Patients physical therapy order has expired and [facility] needs a new Rx faxed to them for Sara [name]

## 2014-08-20 NOTE — Telephone Encounter (Signed)
Patient called back following appointment today with question regarding steri-strips; requests nurse to return call.  Ph#'s are [phone] [facility]) and [phone] [facility])

## 2014-08-20 NOTE — Progress Notes (Signed)
Patient ID: Sara [name], female   DOB: August 06, 1946, 68 y.o.   MRN: [id number] Subjective:    Sara [name] is here for followup after left total knee replacement surgery. Pain is not well controlled.  Medications being used: narcotic analgesics including Percocet. The patient denies  fever, wound drainage, increasing redness, pus, increasing pain, increasing swelling. Post op problems reported:  none She is ambulating very well with her walker greater than 150 feet and has 90 of knee flexion .    DVT Evaluation:  No evidence of DVT seen on physical exam.   Imaging      Assessment:    Status post left total knee arthroplasty. Doing well postoperatively.    Plan:    Sutures removed today. Follow up: 4 weeks.    Start Percocet 10 mg every 4 when necessary pain #90

## 2014-08-20 NOTE — Telephone Encounter (Signed)
Ordered at time of ov

## 2014-08-20 NOTE — Patient Instructions (Signed)
Call to arrange therapy at [facility] outpatient dept

## 2014-08-20 NOTE — Telephone Encounter (Signed)
Patient states steri strips are already raising up, also is it okay to get wet or should she wait another week? Should i have her husband pick up some extra steri strips?

## 2014-08-21 NOTE — Telephone Encounter (Signed)
Weight 1 week  Apply alcohol to the skin and a dry then apply Steri-Strips. You not place any lotion or coils around the areas where the Steri-Strips are

## 2014-08-21 NOTE — Telephone Encounter (Signed)
Patient aware.

## 2014-08-23 ENCOUNTER — Ambulatory Visit (HOSPITAL_COMMUNITY)
Admission: RE | Admit: 2014-08-23 | Discharge: 2014-08-23 | Disposition: A | Discharge status: Home / Self Care | Payer: PRIVATE HEALTH INSURANCE | Source: Ambulatory Visit | Attending: Orthopedic Surgery | Admitting: Orthopedic Surgery

## 2014-08-23 DIAGNOSIS — M25562 Pain in left knee: Secondary | ICD-10-CM | POA: Diagnosis not present

## 2014-08-23 DIAGNOSIS — Z471 Aftercare following joint replacement surgery: Secondary | ICD-10-CM | POA: Insufficient documentation

## 2014-08-23 DIAGNOSIS — R262 Difficulty in walking, not elsewhere classified: Secondary | ICD-10-CM | POA: Diagnosis not present

## 2014-08-23 DIAGNOSIS — M25662 Stiffness of left knee, not elsewhere classified: Secondary | ICD-10-CM | POA: Diagnosis not present

## 2014-08-23 DIAGNOSIS — Z96652 Presence of left artificial knee joint: Secondary | ICD-10-CM | POA: Diagnosis not present

## 2014-08-23 DIAGNOSIS — M1712 Unilateral primary osteoarthritis, left knee: Secondary | ICD-10-CM

## 2014-08-23 DIAGNOSIS — R29898 Other symptoms and signs involving the musculoskeletal system: Secondary | ICD-10-CM

## 2014-08-23 NOTE — Addendum Note (Signed)
Encounter addended by: Leia Alf, PT on: 08/23/2014  7:36 PM<BR>     Documentation filed: Clinical Notes, Flowsheet VN

## 2014-08-23 NOTE — Therapy (Addendum)
[facility] [address] [place], [place], [address] Phone: [phone]   Fax:  [phone]  Physical Therapy Evaluation  Patient Details  Name: Sara [name] MRN: [id number] Date of Birth: June 09, 1946  Encounter Date: 08/23/2014      PT End of Session - 08/23/14 1847    Visit Number 1   Number of Visits 18   Date for PT Re-Evaluation 09/22/14   Authorization Type [employer]   Authorization - Visit Number 1   Authorization - Number of Visits 18      Past Medical History  Diagnosis Date  . Thyroid disease   . Chronic back pain   . Reflux   . Unspecified arthropathy, shoulder region   . Pure hypercholesterolemia     takes Pravastin daily  . Allergic rhinitis due to pollen   . Cervicalgia   . Diverticulosis of colon (without mention of hemorrhage)   . Morbid obesity   . Palpitations   . Peptic ulcer, unspecified site, unspecified as acute or chronic, without mention of hemorrhage, perforation, or obstruction   . Peripheral vertigo, unspecified     takes Meclizine prn  . Reflux esophagitis   . Spinal stenosis in cervical region   . 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 Personal history of tobacco use, presenting hazards to health   . Insomnia, unspecified   . Lumbago   . Right leg pain   . Shortness of breath   . H/O hiatal hernia   . HTN (hypertension)     takes Metoprolol and Lisinopril daily  . Hypertension   . Sleep apnea     slight but doesn't require a CPAP  . History of bronchitis     > 60yr ago  . Seasonal allergies     uses Flonase daily  . Headache(784.0)     denies migraines since the 80's but has occ and takes Butalbital prn  . Cervical spondylosis without myelopathy     all over  . Degeneration of cervical intervertebral disc   . Primary localized osteoarthrosis, lower leg   . GERD (gastroesophageal reflux disease)     takes Omeprazole daily   . Constipation   . Diverticulosis   . History of bladder infections   . Urinary incontinence     occasionally  . Other postablative hypothyroidism     takes SYnthroid daily  . Insomnia     takes Ativan nigtly    Past Surgical History  Procedure Laterality Date  . Knee surgery      arthroscopy-- R  . Tonsillectomy    . Appendectomy    . Colonoscopy  Sept 2008    RMR: normal rectum, left-sided diverticula, repeat in 2018  . Abdominal hysterectomy    . Upper gastrointestinal endoscopy    . Breast lumpectomy      left   . Eye surgery      cataracts removed- bilateral, /w IOL  . Lumpectomy on pelvis    . Diagnostic laparoscopy    . Tubal ligation    . Esophagogastroduodenoscopy    . Anterior lat lumbar fusion  05/13/2012    Procedure: ANTERIOR LATERAL LUMBAR FUSION 2 LEVELS;  Surgeon: Faythe Ghee, MD;  Location: [place] [facility];  Service: Neurosurgery;  Laterality: Left;  Left Lumbar Three-four,Lumbar four-five Extreme Lumbar Interbody Fusion with Percutaneous Pedicle Screws  . Spinal fusion    . Knee arthroscopy  with lateral menisectomy Right 10/06/2013    Procedure: KNEE ARTHROSCOPY WITH LATERAL AND MEDIAL MENISECTOMY;  Surgeon: Carole Civil, MD;  Location: [facility];  Service: Orthopedics;  Laterality: Right;  END @ 1234  . Foreign body removal Right 10/06/2013    Procedure: REMOVAL FOREIGN BODY EXTREMITY;  Surgeon: Carole Civil, MD;  Location: [facility];  Service: Orthopedics;  Laterality: Right;  . Injection knee Left 10/06/2013    Procedure: KNEE INJECTION;  Surgeon: Carole Civil, MD;  Location: [facility];  Service: Orthopedics;  Laterality: Left;  . Total knee arthroplasty Right 01/24/2014    Procedure: TOTAL KNEE ARTHROPLASTY;  Surgeon: Carole Civil, MD;  Location: [facility];  Service: Orthopedics;  Laterality: Right;  . Total knee arthroplasty Left 08/08/2014    Procedure: TOTAL KNEE ARTHROPLASTY;  Surgeon: Carole Civil, MD;  Location: [facility];  Service:  Orthopedics;  Laterality: Left;    There were no vitals taken for this visit.  Visit Diagnosis:  Primary osteoarthritis of left knee  Left knee pain  Left leg weakness  Difficulty walking      Subjective Assessment - 08/23/14 1620    Symptoms Lt knee pain, minpr numbness and tingling in Lateral Left knee   Pertinent History patient had Rt knee replacement in May, and recent Lt knee TKA on 08/08/14, Patient since had HHPT for 3 days, and patient has "tried" to keep up with exercises independently.    How long can you stand comfortably? <15 minutes   How long can you walk comfortably? <15 minutes   Currently in Pain? Yes   Pain Score 5    Pain Location Knee   Pain Orientation Left;Anterior   Pain Descriptors / Indicators Aching   Pain Type Surgical pain   Pain Onset 1 to 4 weeks ago   Pain Frequency Constant   Aggravating Factors  medication, ice, moving knee, straightening knee.           [facility] PT Assessment - 08/23/14 0001    Assessment   Medical Diagnosis Lt TKA, lt knee stiffness, lt knee pain.    Onset Date 08/08/14   Next MD Visit Adonis Huguenin, December 30   Prior Therapy home health 3 visits   Precautions   Precautions None   Restrictions   Weight Bearing Restrictions No   Balance Screen   Has the patient fallen in the past 6 months No   Has the patient had a decrease in activity level because of a fear of falling?  No   Is the patient reluctant to leave their home because of a fear of falling?  No   Home Environment   Living Enviornment Private residence   Prior Function   Level of Independence Independent with basic ADLs   Cognition   Overall Cognitive Status Within Functional Limits for tasks assessed   Observation/Other Assessments   Focus on Therapeutic Outcomes (FOTO)  51% limited   Sensation   Light Touch Appears Intact   Sit to Stand   Comments No UE assist, 5x sit to stand: 18.7 seconds.    Other:   Other/ Comments gait: overall WNL,  decreased Rt foot clearance due to trendelenberg gait, limited stride length due to weakness.  excessive toe out.    AROM   Left Hip Internal Rotation  25   Left Knee Extension 0   Left Knee Flexion 89   Left Ankle Dorsiflexion -3   Strength   Left Hip Flexion 3+/5  Left Hip Extension 3+/5   Left Knee Flexion --  4-//5   Left Knee Extension --  4-/5   Left Ankle Dorsiflexion 4/5            PT Education - 09/11/14 1645    Education provided Yes   Education Details HEP: quadriceps stretch.    Person(s) Educated Patient   Methods Explanation;Demonstration;Handout   Comprehension Verbalized understanding;Returned demonstration          PT Short Term Goals - Sep 11, 2014 1737    PT SHORT TERM GOAL #1   Title Patient will display increaseed knee flexion to 110 degrees to more easily squat to chair without weight shifting to Rt LE   Baseline 89 degrees   Time 3   Period Weeks   Status New   PT SHORT TERM GOAL #2   Title Patient will be able to demosntrate 5x sit to stand in < 15 seconds indicating patient not at high risk for falls.    Baseline >15 seconds   Time 3   Period Weeks   Status New   PT SHORT TERM GOAL #3   Title Patient will demonstrate increased ankle dorsiflexion of >10 degrees.    Baseline <0 degrees   Time 3   Period Weeks   Status New   PT SHORT TERM GOAL #4   Title Patient will report walking >69minutes with tolerable pain.    Baseline <5minutes   Time 3   Period Weeks   Status New          PT Long Term Goals - 2014-09-11 1844    PT LONG TERM GOAL #1   Title Patient will display increaseed knee flexion to 120 degrees to more easily squat to chair without weight shifting to Rt LE   Time 6   Period Weeks   Status New   PT LONG TERM GOAL #2   Title Patient will display increased knee flexion and extension strength on Lt to 5/5 MMT to be bale to go up and down stairs without UE support.    Time 6   Period Weeks   Status New   PT LONG TERM  GOAL #3   Title Patient will report walking >60minutes to get groceries with pain <2/10   Time 6   Period Weeks   Status New          Plan - 09/11/2014 1847    Clinical Impression Statement Patient demonstrated Lt knee stffness s/p Lt knee TKA. Patient will benefit from skille dphsyical therapy to increased Lt LE strength and mobility to be able to perform full squat to work in her garden. patient educated on importance of HEP and to continue performing HEP given by home heatlh therapist until given new HEP. in addition to quadraceps stretch performed this session.    Pt will benefit from skilled therapeutic intervention in order to improve on the following deficits Abnormal gait;Decreased strength;Pain;Difficulty walking;Decreased mobility;Decreased activity tolerance;Decreased balance;Decreased range of motion;Increased fascial restricitons;Improper body mechanics;Impaired flexibility;Decreased endurance   Rehab Potential Poor   PT Frequency 3x / week   PT Duration 6 weeks   PT Treatment/Interventions Therapeutic exercise;Balance training;Patient/family education;Gait training;Manual techniques;Stair training;Passive range of motion   PT Next Visit Plan introduce LE strtches with emphasis on knee flexion mobility   PT Home Exercise Plan prone quadriceps stretch   Consulted and Agree with Plan of Care Patient            G-Codes - 2014/09/11 1935  Functional Assessment Tool Used FOTO 51% limited   Functional Limitation Mobility: Walking and moving around   Mobility: Walking and Moving Around Current Status [phone]) At least 40 percent but less than 60 percent impaired, limited or restricted   Mobility: Walking and Moving Around Goal Status [phone]) At least 20 percent but less than 40 percent impaired, limited or restricted       Problem List Patient Active Problem List   Diagnosis Date Noted  . S/P total knee replacement 08/08/2014  . Difficulty in walking(719.7) 02/15/2014  .  Postoperative stiffness of total knee replacement 02/15/2014  . Pain in joint, lower leg 02/15/2014  . Abnormality of gait 10/11/2013  . S/P arthroscopy of right knee 10/09/2013  . Radicular pain of right lower extremity 07/25/2013  . Knee instability 07/25/2013  . Right knee pain 07/25/2013  . Chest pain 04/25/2013  . Patellar tendonitis 09/07/2012  . OA (osteoarthritis) of knee 02/09/2012  . GERD (gastroesophageal reflux disease) 12/01/2011  . Dysphagia 12/01/2011  . RUQ pain 12/01/2011  . Fatty liver 12/01/2011  . Hematochezia 12/01/2011  . INTERMITTENT VERTIGO 09/12/2010  . KNEE, ARTHRITIS, DEGEN./OSTEO 04/03/2009  . CHEST PAIN, RECURRENT 03/07/2009  . OTHER DYSPHAGIA 03/07/2009  . PALPITATIONS 02/06/2009  . LIVER FUNCTION TESTS, ABNORMAL, HX OF 01/01/2009  . MORBID OBESITY 12/31/2008  . PUD 12/31/2008  . BACK PAIN 12/24/2008  . ANSERINE BURSITIS 12/24/2008  . BENIGN POSITIONAL VERTIGO 07/16/2008  . H N P-LUMBAR 04/02/2008  . SPINAL STENOSIS, CERVICAL 02/15/2008  . SPINAL STENOSIS, LUMBAR 02/15/2008  . Patellar tendinitis 10/19/2007  . TEAR LATERAL MENISCUS 06/13/2007  . OBESITY NOS 10/29/2006  . ALLERGIC RHINITIS, SEASONAL 10/29/2006  . HYPOTHYROIDISM 09/07/2006  . HYPERLIPIDEMIA 09/07/2006  . HYPERTENSION 09/07/2006  . GERD 09/07/2006  . DIVERTICULOSIS, COLON 09/07/2006  . OVERACTIVE BLADDER 09/07/2006  . FIBROCYSTIC BREAST DISEASE 09/07/2006  . OSTEOARTHRITIS 09/07/2006   Devona Konig PT DPT [phone]

## 2014-08-23 NOTE — Patient Instructions (Signed)
Flexion: Stretch - Quadriceps (Prone)  Get a helper or use a rope    Patient should feel stretch along front of thigh.  Hold 20 seconds. Repeat 4 times. Repeat with other leg. Do 3 sessions per day. Copyright  [facility]. All rights reserved.

## 2014-08-27 ENCOUNTER — Ambulatory Visit (HOSPITAL_COMMUNITY)
Admission: RE | Admit: 2014-08-27 | Discharge: 2014-08-27 | Disposition: A | Discharge status: Home / Self Care | Payer: PRIVATE HEALTH INSURANCE | Source: Ambulatory Visit | Attending: Orthopedic Surgery | Admitting: Orthopedic Surgery

## 2014-08-27 DIAGNOSIS — Z96659 Presence of unspecified artificial knee joint: Secondary | ICD-10-CM

## 2014-08-27 DIAGNOSIS — R269 Unspecified abnormalities of gait and mobility: Secondary | ICD-10-CM

## 2014-08-27 DIAGNOSIS — M25561 Pain in right knee: Secondary | ICD-10-CM

## 2014-08-27 DIAGNOSIS — M25669 Stiffness of unspecified knee, not elsewhere classified: Secondary | ICD-10-CM

## 2014-08-27 DIAGNOSIS — Z471 Aftercare following joint replacement surgery: Secondary | ICD-10-CM | POA: Diagnosis not present

## 2014-08-27 DIAGNOSIS — R29898 Other symptoms and signs involving the musculoskeletal system: Secondary | ICD-10-CM

## 2014-08-27 DIAGNOSIS — R262 Difficulty in walking, not elsewhere classified: Secondary | ICD-10-CM

## 2014-08-27 DIAGNOSIS — M25562 Pain in left knee: Secondary | ICD-10-CM

## 2014-08-27 DIAGNOSIS — M1712 Unilateral primary osteoarthritis, left knee: Secondary | ICD-10-CM

## 2014-08-27 DIAGNOSIS — T8489XA Other specified complication of internal orthopedic prosthetic devices, implants and grafts, initial encounter: Secondary | ICD-10-CM

## 2014-08-27 NOTE — Therapy (Signed)
[facility] [address] [place], [place], [address] Phone: [phone]   Fax:  [phone]  Physical Therapy Treatment  Patient Details  Name: Sara [name] MRN: [id number] Date of Birth: 1946-08-18  Encounter Date: 08/27/2014      PT End of Session - 08/27/14 0842    Visit Number 2   Number of Visits 18   Date for PT Re-Evaluation 09/22/14   Authorization Type [employer] Medicare   Authorization - Visit Number 2   Authorization - Number of Visits 18   PT Start Time 0806   PT Stop Time 0846   PT Time Calculation (min) 40 min   Activity Tolerance Patient tolerated treatment well   Behavior During Therapy [facility] for tasks assessed/performed      Past Medical History  Diagnosis Date  . Thyroid disease   . Chronic back pain   . Reflux   . Unspecified arthropathy, shoulder region   . Pure hypercholesterolemia     takes Pravastin daily  . Allergic rhinitis due to pollen   . Cervicalgia   . Diverticulosis of colon (without mention of hemorrhage)   . Morbid obesity   . Palpitations   . Peptic ulcer, unspecified site, unspecified as acute or chronic, without mention of hemorrhage, perforation, or obstruction   . Peripheral vertigo, unspecified     takes Meclizine prn  . Reflux esophagitis   . Spinal stenosis in cervical region   . 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 Personal history of tobacco use, presenting hazards to health   . Insomnia, unspecified   . Lumbago   . Right leg pain   . Shortness of breath   . H/O hiatal hernia   . HTN (hypertension)     takes Metoprolol and Lisinopril daily  . Hypertension   . Sleep apnea     slight but doesn't require a CPAP  . History of bronchitis     > 48yr ago  . Seasonal allergies     uses Flonase daily  . Headache(784.0)     denies migraines since the 80's but has occ and takes Butalbital prn  . Cervical spondylosis without  myelopathy     all over  . Degeneration of cervical intervertebral disc   . Primary localized osteoarthrosis, lower leg   . GERD (gastroesophageal reflux disease)     takes Omeprazole daily  . Constipation   . Diverticulosis   . History of bladder infections   . Urinary incontinence     occasionally  . Other postablative hypothyroidism     takes SYnthroid daily  . Insomnia     takes Ativan nigtly    Past Surgical History  Procedure Laterality Date  . Knee surgery      arthroscopy-- R  . Tonsillectomy    . Appendectomy    . Colonoscopy  Sept 2008    RMR: normal rectum, left-sided diverticula, repeat in 2018  . Abdominal hysterectomy    . Upper gastrointestinal endoscopy    . Breast lumpectomy      left   . Eye surgery      cataracts removed- bilateral, /w IOL  . Lumpectomy on pelvis    . Diagnostic laparoscopy    . Tubal ligation    . Esophagogastroduodenoscopy    . Anterior lat lumbar fusion  05/13/2012    Procedure: ANTERIOR LATERAL LUMBAR FUSION 2 LEVELS;  Surgeon: Louie Casa  Sharlyne Cai, MD;  Location: [place] [facility];  Service: Neurosurgery;  Laterality: Left;  Left Lumbar Three-four,Lumbar four-five Extreme Lumbar Interbody Fusion with Percutaneous Pedicle Screws  . Spinal fusion    . Knee arthroscopy with lateral menisectomy Right 10/06/2013    Procedure: KNEE ARTHROSCOPY WITH LATERAL AND MEDIAL MENISECTOMY;  Surgeon: Carole Civil, MD;  Location: [facility];  Service: Orthopedics;  Laterality: Right;  END @ 1234  . Foreign body removal Right 10/06/2013    Procedure: REMOVAL FOREIGN BODY EXTREMITY;  Surgeon: Carole Civil, MD;  Location: [facility];  Service: Orthopedics;  Laterality: Right;  . Injection knee Left 10/06/2013    Procedure: KNEE INJECTION;  Surgeon: Carole Civil, MD;  Location: [facility];  Service: Orthopedics;  Laterality: Left;  . Total knee arthroplasty Right 01/24/2014    Procedure: TOTAL KNEE ARTHROPLASTY;  Surgeon: Carole Civil, MD;  Location: [facility];  Service: Orthopedics;  Laterality: Right;  . Total knee arthroplasty Left 08/08/2014    Procedure: TOTAL KNEE ARTHROPLASTY;  Surgeon: Carole Civil, MD;  Location: [facility];  Service: Orthopedics;  Laterality: Left;    There were no vitals taken for this visit.  Visit Diagnosis:  Primary osteoarthritis of left knee  Left knee pain  Left leg weakness  Difficulty walking  Pain in joint, lower leg, right  Pain in joint, lower leg, left  Postoperative stiffness of total knee replacement, initial encounter  Abnormality of gait      Subjective Assessment - 08/27/14 0810    Symptoms Pt reports mild complaints of pain today at 1-2/10.  Pt reports more complaints of stiffness and swelling in the Lt knee since initial evaluation.    Currently in Pain? Yes   Pain Score 2    Pain Location Knee   Pain Orientation Left          [facility] PT Assessment - 08/27/14 0809    Assessment   Medical Diagnosis Lt TKA, lt knee stiffness, lt knee pain.    Onset Date 08/08/14   Next MD Visit Adonis Huguenin, December 30          [facility] Adult PT Treatment/Exercise - 08/27/14 0808    Exercises   Exercises Knee/Hip   Knee/Hip Exercises: Stretches   Active Hamstring Stretch 3 reps;30 seconds   Active Hamstring Stretch Limitations 12" Step   Quad Stretch 3 reps;30 seconds   Quad Stretch Limitations prone with rope   Knee: Self-Stretch to increase Flexion 3 reps;20 seconds   Knee: Self-Stretch Limitations 12" Step   Gastroc Stretch 3 reps;30 seconds   Gastroc Stretch Limitations Slantboard   Knee/Hip Exercises: Aerobic   Stationary Bike 5', seat 9 -> 8 working on knee flexion   Knee/Hip Exercises: Standing   Heel Raises 10 reps  1 fingertip assist   Heel Raises Limitations Toe Raises x10   2 fingertip assist   Knee/Hip Exercises: Supine   Quad Sets 10 reps;Left   Quad Sets Limitations 3" hold   Heel Slides 2 sets;10 reps   Heel Slides Limitations x10 AROM, x10 with rope    Straight Leg Raises 10 reps   Knee/Hip Exercises: Prone   Hamstring Curl 10 reps   Hamstring Curl Limitations Lt LE   Hip Extension 10 reps;Left   Manual Therapy   Manual Therapy Passive ROM;Joint mobilization   Joint Mobilization Patellar mobs all directions   Passive ROM PROM Lt knee flexion/extension  PT Short Term Goals - 08/27/14 2836    PT SHORT TERM GOAL #1   Title Patient will display increaseed knee flexion to 110 degrees to more easily squat to chair without weight shifting to Rt LE   Status On-going   PT SHORT TERM GOAL #3   Title Patient will demonstrate increased ankle dorsiflexion of >10 degrees.    Status On-going            Plan - 08/27/14 6294    Clinical Impression Statement PT POC initiated working on knee ROM, flexibility, and strength of the Lt LE.  Knee flexion emphasized today, encouraging pt to complete heel slides, knee flexion stretch, and bike with as much knee flexion as tolerated.  Pt does demonstarte some extensor lag with SLR flexion, and encouraged quad strength with quad set today.     Pt will benefit from skilled therapeutic intervention in order to improve on the following deficits Abnormal gait;Decreased strength;Pain;Difficulty walking;Decreased mobility;Decreased activity tolerance;Decreased balance;Decreased range of motion;Increased fascial restricitons;Improper body mechanics;Impaired flexibility;Decreased endurance   PT Frequency 3x / week   PT Duration 6 weeks   PT Treatment/Interventions Therapeutic exercise;Balance training;Patient/family education;Gait training;Manual techniques;Stair training;Passive range of motion   PT Next Visit Plan Give HEP for mat exercises, adding SLR hip ABD.  Decrease seat on bike from 8-> 7 to increase knee flexion.         Problem List Patient Active Problem List   Diagnosis Date Noted  . S/P total knee replacement 08/08/2014  . Difficulty in walking(719.7) 02/15/2014  . Postoperative  stiffness of total knee replacement 02/15/2014  . Pain in joint, lower leg 02/15/2014  . Abnormality of gait 10/11/2013  . S/P arthroscopy of right knee 10/09/2013  . Radicular pain of right lower extremity 07/25/2013  . Knee instability 07/25/2013  . Right knee pain 07/25/2013  . Chest pain 04/25/2013  . Patellar tendonitis 09/07/2012  . OA (osteoarthritis) of knee 02/09/2012  . GERD (gastroesophageal reflux disease) 12/01/2011  . Dysphagia 12/01/2011  . RUQ pain 12/01/2011  . Fatty liver 12/01/2011  . Hematochezia 12/01/2011  . INTERMITTENT VERTIGO 09/12/2010  . KNEE, ARTHRITIS, DEGEN./OSTEO 04/03/2009  . CHEST PAIN, RECURRENT 03/07/2009  . OTHER DYSPHAGIA 03/07/2009  . PALPITATIONS 02/06/2009  . LIVER FUNCTION TESTS, ABNORMAL, HX OF 01/01/2009  . MORBID OBESITY 12/31/2008  . PUD 12/31/2008  . BACK PAIN 12/24/2008  . ANSERINE BURSITIS 12/24/2008  . BENIGN POSITIONAL VERTIGO 07/16/2008  . H N P-LUMBAR 04/02/2008  . SPINAL STENOSIS, CERVICAL 02/15/2008  . SPINAL STENOSIS, LUMBAR 02/15/2008  . Patellar tendinitis 10/19/2007  . TEAR LATERAL MENISCUS 06/13/2007  . OBESITY NOS 10/29/2006  . ALLERGIC RHINITIS, SEASONAL 10/29/2006  . HYPOTHYROIDISM 09/07/2006  . HYPERLIPIDEMIA 09/07/2006  . HYPERTENSION 09/07/2006  . GERD 09/07/2006  . DIVERTICULOSIS, COLON 09/07/2006  . OVERACTIVE BLADDER 09/07/2006  . FIBROCYSTIC BREAST DISEASE 09/07/2006  . OSTEOARTHRITIS 09/07/2006   Lonna Cobb, DPT [phone]

## 2014-08-29 ENCOUNTER — Ambulatory Visit (HOSPITAL_COMMUNITY)
Admission: RE | Admit: 2014-08-29 | Discharge: 2014-08-29 | Disposition: A | Discharge status: Home / Self Care | Payer: PRIVATE HEALTH INSURANCE | Source: Ambulatory Visit | Attending: Orthopedic Surgery | Admitting: Orthopedic Surgery

## 2014-08-29 DIAGNOSIS — R269 Unspecified abnormalities of gait and mobility: Secondary | ICD-10-CM

## 2014-08-29 DIAGNOSIS — T8489XA Other specified complication of internal orthopedic prosthetic devices, implants and grafts, initial encounter: Secondary | ICD-10-CM

## 2014-08-29 DIAGNOSIS — Z96659 Presence of unspecified artificial knee joint: Secondary | ICD-10-CM

## 2014-08-29 DIAGNOSIS — M1712 Unilateral primary osteoarthritis, left knee: Secondary | ICD-10-CM

## 2014-08-29 DIAGNOSIS — M25669 Stiffness of unspecified knee, not elsewhere classified: Secondary | ICD-10-CM

## 2014-08-29 DIAGNOSIS — M25562 Pain in left knee: Secondary | ICD-10-CM

## 2014-08-29 DIAGNOSIS — Z471 Aftercare following joint replacement surgery: Secondary | ICD-10-CM | POA: Diagnosis not present

## 2014-08-29 DIAGNOSIS — M25561 Pain in right knee: Secondary | ICD-10-CM

## 2014-08-29 DIAGNOSIS — R262 Difficulty in walking, not elsewhere classified: Secondary | ICD-10-CM

## 2014-08-29 DIAGNOSIS — R29898 Other symptoms and signs involving the musculoskeletal system: Secondary | ICD-10-CM

## 2014-08-29 NOTE — Therapy (Signed)
[facility] [address] [place], [place], [address] Phone: [phone]   Fax:  [phone]  Physical Therapy Treatment  Patient Details  Name: Sara [name] MRN: [id number] Date of Birth: 20-Jan-1946  Encounter Date: 08/29/2014      PT End of Session - 08/29/14 1401    Visit Number 3   Number of Visits 18   Date for PT Re-Evaluation 09/22/14   Authorization Type [employer] Medicare   Authorization - Visit Number 3   Authorization - Number of Visits 18   PT Start Time 1350   PT Stop Time 1430   PT Time Calculation (min) 40 min   Activity Tolerance Patient tolerated treatment well   Behavior During Therapy [facility] for tasks assessed/performed      Past Medical History  Diagnosis Date  . Thyroid disease   . Chronic back pain   . Reflux   . Unspecified arthropathy, shoulder region   . Pure hypercholesterolemia     takes Pravastin daily  . Allergic rhinitis due to pollen   . Cervicalgia   . Diverticulosis of colon (without mention of hemorrhage)   . Morbid obesity   . Palpitations   . Peptic ulcer, unspecified site, unspecified as acute or chronic, without mention of hemorrhage, perforation, or obstruction   . Peripheral vertigo, unspecified     takes Meclizine prn  . Reflux esophagitis   . Spinal stenosis in cervical region   . 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 Personal history of tobacco use, presenting hazards to health   . Insomnia, unspecified   . Lumbago   . Right leg pain   . Shortness of breath   . H/O hiatal hernia   . HTN (hypertension)     takes Metoprolol and Lisinopril daily  . Hypertension   . Sleep apnea     slight but doesn't require a CPAP  . History of bronchitis     > 105yr ago  . Seasonal allergies     uses Flonase daily  . Headache(784.0)     denies migraines since the 80's but has occ and takes Butalbital prn  . Cervical spondylosis without  myelopathy     all over  . Degeneration of cervical intervertebral disc   . Primary localized osteoarthrosis, lower leg   . GERD (gastroesophageal reflux disease)     takes Omeprazole daily  . Constipation   . Diverticulosis   . History of bladder infections   . Urinary incontinence     occasionally  . Other postablative hypothyroidism     takes SYnthroid daily  . Insomnia     takes Ativan nigtly    Past Surgical History  Procedure Laterality Date  . Knee surgery      arthroscopy-- R  . Tonsillectomy    . Appendectomy    . Colonoscopy  Sept 2008    RMR: normal rectum, left-sided diverticula, repeat in 2018  . Abdominal hysterectomy    . Upper gastrointestinal endoscopy    . Breast lumpectomy      left   . Eye surgery      cataracts removed- bilateral, /w IOL  . Lumpectomy on pelvis    . Diagnostic laparoscopy    . Tubal ligation    . Esophagogastroduodenoscopy    . Anterior lat lumbar fusion  05/13/2012    Procedure: ANTERIOR LATERAL LUMBAR FUSION 2 LEVELS;  Surgeon: Louie Casa  Sharlyne Cai, MD;  Location: [place] [facility];  Service: Neurosurgery;  Laterality: Left;  Left Lumbar Three-four,Lumbar four-five Extreme Lumbar Interbody Fusion with Percutaneous Pedicle Screws  . Spinal fusion    . Knee arthroscopy with lateral menisectomy Right 10/06/2013    Procedure: KNEE ARTHROSCOPY WITH LATERAL AND MEDIAL MENISECTOMY;  Surgeon: Carole Civil, MD;  Location: [facility];  Service: Orthopedics;  Laterality: Right;  END @ 1234  . Foreign body removal Right 10/06/2013    Procedure: REMOVAL FOREIGN BODY EXTREMITY;  Surgeon: Carole Civil, MD;  Location: [facility];  Service: Orthopedics;  Laterality: Right;  . Injection knee Left 10/06/2013    Procedure: KNEE INJECTION;  Surgeon: Carole Civil, MD;  Location: [facility];  Service: Orthopedics;  Laterality: Left;  . Total knee arthroplasty Right 01/24/2014    Procedure: TOTAL KNEE ARTHROPLASTY;  Surgeon: Carole Civil, MD;  Location: [facility];  Service: Orthopedics;  Laterality: Right;  . Total knee arthroplasty Left 08/08/2014    Procedure: TOTAL KNEE ARTHROPLASTY;  Surgeon: Carole Civil, MD;  Location: [facility];  Service: Orthopedics;  Laterality: Left;    There were no vitals taken for this visit.  Visit Diagnosis:  Primary osteoarthritis of left knee  Left knee pain  Left leg weakness  Difficulty walking  Pain in joint, lower leg, right  Pain in joint, lower leg, left  Postoperative stiffness of total knee replacement, initial encounter  Abnormality of gait  Right knee pain      Subjective Assessment - 08/29/14 1354    Symptoms Pt reports mild complaints of pain today at 4/10.  Pt reports taking one pain pill to help.    Currently in Pain? Yes   Pain Score 4    Pain Location Knee   Pain Orientation Left   Pain Descriptors / Indicators Aching            [facility] Adult PT Treatment/Exercise - 08/29/14 0001    Knee/Hip Exercises: Stretches   Active Hamstring Stretch 3 reps;30 seconds   Active Hamstring Stretch Limitations 12" Step 3 way   Quad Stretch 3 reps;30 seconds   Quad Stretch Limitations prone with rope   Hip Flexor Stretch 3 reps;20 seconds   Hip Flexor Stretch Limitations to 14" box   Gastroc Stretch 3 reps;30 seconds   Gastroc Stretch Limitations Slantboard 3 way   Knee/Hip Exercises: Standing   Heel Raises 10 reps;2 sets  1 fingertip assist   Heel Raises Limitations Toe Raises x10   2 fingertip assist   Other Standing Knee Exercises 3D hip excursions 10x   Knee/Hip Exercises: Supine   Bridges 10 reps;Both;Strengthening;2 sets   Manual Therapy   Manual Therapy Passive ROM;Joint mobilization   Joint Mobilization Patellar mobs all directions   Passive ROM   PROM Lt knee flexion/extension           PT Education - 08/29/14 1405    Education provided Yes   Education Details HEP 3D hip excursions   Person(s) Educated Patient   Methods Explanation;Demonstration;Handout    Comprehension Verbalized understanding;Returned demonstration          PT Short Term Goals - 08/29/14 1416    PT SHORT TERM GOAL #1   Title Patient will display increaseed knee flexion to 110 degrees to more easily squat to chair without weight shifting to Rt LE   Status On-going   PT SHORT TERM GOAL #3   Title Patient will demonstrate increased ankle dorsiflexion of >10  degrees.    Status On-going          PT Long Term Goals - 08/29/14 1416    PT LONG TERM GOAL #1   Title Patient will display increaseed knee flexion to 120 degrees to more easily squat to chair without weight shifting to Rt LE   Time 6   Period Weeks   Status New   PT LONG TERM GOAL #2   Title Patient will display increased knee flexion and extension strength on Lt to 5/5 MMT to be bale to go up and down stairs without UE support.    Time 6   Period Weeks   Status New   PT LONG TERM GOAL #3   Title Patient will report walking >57minutes to get groceries with pain <2/10   Time 6   Period Weeks   Status New          Plan - 08/29/14 1416    PT Next Visit Plan introduce lunges to 12" box with 3 way UE reach and exercises to progress gait mobility.            Problem List Patient Active Problem List   Diagnosis Date Noted  . S/P total knee replacement 08/08/2014  . Difficulty in walking(719.7) 02/15/2014  . Postoperative stiffness of total knee replacement 02/15/2014  . Pain in joint, lower leg 02/15/2014  . Abnormality of gait 10/11/2013  . S/P arthroscopy of right knee 10/09/2013  . Radicular pain of right lower extremity 07/25/2013  . Knee instability 07/25/2013  . Right knee pain 07/25/2013  . Chest pain 04/25/2013  . Patellar tendonitis 09/07/2012  . OA (osteoarthritis) of knee 02/09/2012  . GERD (gastroesophageal reflux disease) 12/01/2011  . Dysphagia 12/01/2011  . RUQ pain 12/01/2011  . Fatty liver 12/01/2011  . Hematochezia 12/01/2011  . INTERMITTENT VERTIGO 09/12/2010  .  KNEE, ARTHRITIS, DEGEN./OSTEO 04/03/2009  . CHEST PAIN, RECURRENT 03/07/2009  . OTHER DYSPHAGIA 03/07/2009  . PALPITATIONS 02/06/2009  . LIVER FUNCTION TESTS, ABNORMAL, HX OF 01/01/2009  . MORBID OBESITY 12/31/2008  . PUD 12/31/2008  . BACK PAIN 12/24/2008  . ANSERINE BURSITIS 12/24/2008  . BENIGN POSITIONAL VERTIGO 07/16/2008  . H N P-LUMBAR 04/02/2008  . SPINAL STENOSIS, CERVICAL 02/15/2008  . SPINAL STENOSIS, LUMBAR 02/15/2008  . Patellar tendinitis 10/19/2007  . TEAR LATERAL MENISCUS 06/13/2007  . OBESITY NOS 10/29/2006  . ALLERGIC RHINITIS, SEASONAL 10/29/2006  . HYPOTHYROIDISM 09/07/2006  . HYPERLIPIDEMIA 09/07/2006  . HYPERTENSION 09/07/2006  . GERD 09/07/2006  . DIVERTICULOSIS, COLON 09/07/2006  . OVERACTIVE BLADDER 09/07/2006  . FIBROCYSTIC BREAST DISEASE 09/07/2006  . OSTEOARTHRITIS 09/07/2006    Devona Konig PT DPT [phone]

## 2014-08-29 NOTE — Patient Instructions (Signed)
3D Hip Excursions          

## 2014-09-04 ENCOUNTER — Telehealth: Payer: Self-pay | Admitting: Orthopedic Surgery

## 2014-09-04 ENCOUNTER — Other Ambulatory Visit: Payer: Self-pay | Admitting: *Deleted

## 2014-09-04 ENCOUNTER — Ambulatory Visit (HOSPITAL_COMMUNITY)
Admission: RE | Admit: 2014-09-04 | Discharge: 2014-09-04 | Disposition: A | Discharge status: Home / Self Care | Payer: PRIVATE HEALTH INSURANCE | Source: Ambulatory Visit | Attending: Orthopedic Surgery | Admitting: Orthopedic Surgery

## 2014-09-04 DIAGNOSIS — Z96652 Presence of left artificial knee joint: Secondary | ICD-10-CM

## 2014-09-04 DIAGNOSIS — M25562 Pain in left knee: Secondary | ICD-10-CM

## 2014-09-04 DIAGNOSIS — Z471 Aftercare following joint replacement surgery: Secondary | ICD-10-CM | POA: Diagnosis not present

## 2014-09-04 DIAGNOSIS — Z96659 Presence of unspecified artificial knee joint: Secondary | ICD-10-CM

## 2014-09-04 DIAGNOSIS — M25669 Stiffness of unspecified knee, not elsewhere classified: Secondary | ICD-10-CM

## 2014-09-04 DIAGNOSIS — M25561 Pain in right knee: Secondary | ICD-10-CM

## 2014-09-04 DIAGNOSIS — M1712 Unilateral primary osteoarthritis, left knee: Secondary | ICD-10-CM

## 2014-09-04 DIAGNOSIS — R262 Difficulty in walking, not elsewhere classified: Secondary | ICD-10-CM

## 2014-09-04 DIAGNOSIS — T8489XA Other specified complication of internal orthopedic prosthetic devices, implants and grafts, initial encounter: Secondary | ICD-10-CM

## 2014-09-04 DIAGNOSIS — R269 Unspecified abnormalities of gait and mobility: Secondary | ICD-10-CM

## 2014-09-04 MED ORDER — OXYCODONE-ACETAMINOPHEN 10-325 MG PO TABS
1.0000 | ORAL_TABLET | ORAL | Status: DC | PRN
Start: 2014-09-04 — End: 2014-10-29

## 2014-09-04 MED ORDER — OXYCODONE-ACETAMINOPHEN 10-325 MG PO TABS: 1.0000 | ORAL_TABLET | ORAL | Status: DC | PRN

## 2014-09-04 NOTE — Therapy (Signed)
[facility] [address] [place], [place], [address] Phone: [phone]   Fax:  [phone]  Physical Therapy Treatment  Patient Details  Name: Sara [name] MRN: [id number] Date of Birth: 27-Nov-1945  Encounter Date: 09/04/2014      PT End of Session - 09/04/14 1149    Visit Number 4   Number of Visits 18   Date for PT Re-Evaluation 09/22/14   Authorization Type [employer] Medicare   Authorization - Visit Number 4   Authorization - Number of Visits 18   PT Start Time 1281   PT Stop Time 1148   PT Time Calculation (min) 43 min   Activity Tolerance Patient tolerated treatment well   Behavior During Therapy [facility] for tasks assessed/performed      Past Medical History  Diagnosis Date  . Thyroid disease   . Chronic back pain   . Reflux   . Unspecified arthropathy, shoulder region   . Pure hypercholesterolemia     takes Pravastin daily  . Allergic rhinitis due to pollen   . Cervicalgia   . Diverticulosis of colon (without mention of hemorrhage)   . Morbid obesity   . Palpitations   . Peptic ulcer, unspecified site, unspecified as acute or chronic, without mention of hemorrhage, perforation, or obstruction   . Peripheral vertigo, unspecified     takes Meclizine prn  . Reflux esophagitis   . Spinal stenosis in cervical region   . 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 Personal history of tobacco use, presenting hazards to health   . Insomnia, unspecified   . Lumbago   . Right leg pain   . Shortness of breath   . H/O hiatal hernia   . HTN (hypertension)     takes Metoprolol and Lisinopril daily  . Hypertension   . Sleep apnea     slight but doesn't require a CPAP  . History of bronchitis     > 5yr ago  . Seasonal allergies     uses Flonase daily  . Headache(784.0)     denies migraines since the 80's but has occ and takes Butalbital prn  . Cervical spondylosis without  myelopathy     all over  . Degeneration of cervical intervertebral disc   . Primary localized osteoarthrosis, lower leg   . GERD (gastroesophageal reflux disease)     takes Omeprazole daily  . Constipation   . Diverticulosis   . History of bladder infections   . Urinary incontinence     occasionally  . Other postablative hypothyroidism     takes SYnthroid daily  . Insomnia     takes Ativan nigtly    Past Surgical History  Procedure Laterality Date  . Knee surgery      arthroscopy-- R  . Tonsillectomy    . Appendectomy    . Colonoscopy  Sept 2008    RMR: normal rectum, left-sided diverticula, repeat in 2018  . Abdominal hysterectomy    . Upper gastrointestinal endoscopy    . Breast lumpectomy      left   . Eye surgery      cataracts removed- bilateral, /w IOL  . Lumpectomy on pelvis    . Diagnostic laparoscopy    . Tubal ligation    . Esophagogastroduodenoscopy    . Anterior lat lumbar fusion  05/13/2012    Procedure: ANTERIOR LATERAL LUMBAR FUSION 2 LEVELS;  Surgeon: Louie Casa  Sharlyne Cai, MD;  Location: [place] [facility];  Service: Neurosurgery;  Laterality: Left;  Left Lumbar Three-four,Lumbar four-five Extreme Lumbar Interbody Fusion with Percutaneous Pedicle Screws  . Spinal fusion    . Knee arthroscopy with lateral menisectomy Right 10/06/2013    Procedure: KNEE ARTHROSCOPY WITH LATERAL AND MEDIAL MENISECTOMY;  Surgeon: Carole Civil, MD;  Location: [facility];  Service: Orthopedics;  Laterality: Right;  END @ 1234  . Foreign body removal Right 10/06/2013    Procedure: REMOVAL FOREIGN BODY EXTREMITY;  Surgeon: Carole Civil, MD;  Location: [facility];  Service: Orthopedics;  Laterality: Right;  . Injection knee Left 10/06/2013    Procedure: KNEE INJECTION;  Surgeon: Carole Civil, MD;  Location: [facility];  Service: Orthopedics;  Laterality: Left;  . Total knee arthroplasty Right 01/24/2014    Procedure: TOTAL KNEE ARTHROPLASTY;  Surgeon: Carole Civil, MD;  Location: [facility];  Service: Orthopedics;  Laterality: Right;  . Total knee arthroplasty Left 08/08/2014    Procedure: TOTAL KNEE ARTHROPLASTY;  Surgeon: Carole Civil, MD;  Location: [facility];  Service: Orthopedics;  Laterality: Left;    There were no vitals taken for this visit.  Visit Diagnosis:  Primary osteoarthritis of left knee  Postoperative stiffness of total knee replacement, initial encounter  Left knee pain  Pain in joint, lower leg, left  Difficulty walking  Pain in joint, lower leg, right  Abnormality of gait        [facility] PT Assessment - 09/04/14 1116    Assessment   Medical Diagnosis Lt TKA, lt knee stiffness, lt knee pain.    Onset Date 08/08/14   Next MD Visit Arther Abbott, December 28   Flexibility   Soft Tissue Assessment /Muscle Lenght yes   Quadriceps Rt 10 in, Lt 12 1/2          [facility] Adult PT Treatment/Exercise - 09/04/14 1106    Exercises   Exercises Knee/Hip   Knee/Hip Exercises: Stretches   Active Hamstring Stretch 3 reps;20 seconds   Active Hamstring Stretch Limitations 12" Step 3 way   Quad Stretch 3 reps;30 seconds   Quad Stretch Limitations prone with rope   Hip Flexor Stretch --   Hip Flexor Stretch Limitations --   Knee: Self-Stretch to increase Flexion 3 reps;20 seconds   Knee: Self-Stretch Limitations 14" box   Gastroc Stretch 3 reps;30 seconds   Gastroc Stretch Limitations Slantboard 3 way   Knee/Hip Exercises: Aerobic   Stationary Bike 5', seat 8->7 working on knee flexion    Knee/Hip Exercises: Standing   Heel Raises 20 reps  No UE   Heel Raises Limitations Toe Raises x20  2 finger tip assist   Forward Lunges 10 reps;Left   Forward Lunges Limitations 12" step   Forward Step Up 10 reps;Hand Hold: 2;Step Height: 4";Left   Other Standing Knee Exercises Side Stepping 1 RT   Knee/Hip Exercises: Supine   Bridges 2 sets;10 reps   Knee/Hip Exercises: Sidelying   Hip ABduction 2 sets;10 reps;Left   Hip ABduction Limitations 1#    Knee/Hip Exercises: Prone   Hip Extension 2 sets;10 reps;Left   Hip Extension Limitations 1#            PT Short Term Goals - 09/04/14 1152    PT SHORT TERM GOAL #1   Title Patient will display increaseed knee flexion to 110 degrees to more easily squat to chair without weight shifting to Rt LE   Status On-going  PT SHORT TERM GOAL #2   Title Patient will be able to demosntrate 5x sit to stand in < 15 seconds indicating patient not at high risk for falls.    Status On-going   PT SHORT TERM GOAL #3   Title Patient will demonstrate increased ankle dorsiflexion of >10 degrees.    Status On-going   PT SHORT TERM GOAL #4   Title Patient will report walking >16minutes with tolerable pain.    Status On-going          PT Long Term Goals - 09/04/14 1152    PT LONG TERM GOAL #2   Title Patient will display increased knee flexion and extension strength on Lt to 5/5 MMT to be able to go up and down stairs without UE support.    Status On-going          Plan - 09/04/14 1149    Clinical Impression Statement Continued exercise program, emphasizing knee flexion and strengthening for the Lt LE.  Pt is currently 4 weeks post op, and was able to progress to low steps forward and lunging onto tall box today.  Pt did require some verbal cueing for technique, to avoid hip circumduction to ascend 4" step, and to decrease use of UE to step up onto box. secondary to Lt quad weakness.    Pt will benefit from skilled therapeutic intervention in order to improve on the following deficits Abnormal gait;Decreased strength;Pain;Difficulty walking;Decreased mobility;Decreased activity tolerance;Decreased balance;Decreased range of motion;Increased fascial restricitons;Improper body mechanics;Impaired flexibility;Decreased endurance   Rehab Potential Fair   PT Frequency 3x / week   PT Duration 6 weeks   PT Next Visit Plan Add 3 way UE reach with lunging on 12" box.  Continue ROM of Lt LE working on knee  flexion.         Problem List Patient Active Problem List   Diagnosis Date Noted  . S/P total knee replacement 08/08/2014  . Difficulty in walking(719.7) 02/15/2014  . Postoperative stiffness of total knee replacement 02/15/2014  . Pain in joint, lower leg 02/15/2014  . Abnormality of gait 10/11/2013  . S/P arthroscopy of right knee 10/09/2013  . Radicular pain of right lower extremity 07/25/2013  . Knee instability 07/25/2013  . Right knee pain 07/25/2013  . Chest pain 04/25/2013  . Patellar tendonitis 09/07/2012  . OA (osteoarthritis) of knee 02/09/2012  . GERD (gastroesophageal reflux disease) 12/01/2011  . Dysphagia 12/01/2011  . RUQ pain 12/01/2011  . Fatty liver 12/01/2011  . Hematochezia 12/01/2011  . INTERMITTENT VERTIGO 09/12/2010  . KNEE, ARTHRITIS, DEGEN./OSTEO 04/03/2009  . CHEST PAIN, RECURRENT 03/07/2009  . OTHER DYSPHAGIA 03/07/2009  . PALPITATIONS 02/06/2009  . LIVER FUNCTION TESTS, ABNORMAL, HX OF 01/01/2009  . MORBID OBESITY 12/31/2008  . PUD 12/31/2008  . BACK PAIN 12/24/2008  . ANSERINE BURSITIS 12/24/2008  . BENIGN POSITIONAL VERTIGO 07/16/2008  . H N P-LUMBAR 04/02/2008  . SPINAL STENOSIS, CERVICAL 02/15/2008  . SPINAL STENOSIS, LUMBAR 02/15/2008  . Patellar tendinitis 10/19/2007  . TEAR LATERAL MENISCUS 06/13/2007  . OBESITY NOS 10/29/2006  . ALLERGIC RHINITIS, SEASONAL 10/29/2006  . HYPOTHYROIDISM 09/07/2006  . HYPERLIPIDEMIA 09/07/2006  . HYPERTENSION 09/07/2006  . GERD 09/07/2006  . DIVERTICULOSIS, COLON 09/07/2006  . OVERACTIVE BLADDER 09/07/2006  . FIBROCYSTIC BREAST DISEASE 09/07/2006  . OSTEOARTHRITIS 09/07/2006   Lonna Cobb, DPT [phone]

## 2014-09-04 NOTE — Telephone Encounter (Signed)
Patient requests refill of medication:  oxyCODONE-acetaminophen (PERCOCET) 10-325 MG per tablet [[id number]] - her phone # is [phone]

## 2014-09-05 ENCOUNTER — Ambulatory Visit (HOSPITAL_COMMUNITY)
Admission: RE | Admit: 2014-09-05 | Discharge: 2014-09-05 | Disposition: A | Discharge status: Home / Self Care | Payer: PRIVATE HEALTH INSURANCE | Source: Ambulatory Visit | Attending: Family Medicine | Admitting: Family Medicine

## 2014-09-05 DIAGNOSIS — R29898 Other symptoms and signs involving the musculoskeletal system: Secondary | ICD-10-CM

## 2014-09-05 DIAGNOSIS — Z96659 Presence of unspecified artificial knee joint: Secondary | ICD-10-CM

## 2014-09-05 DIAGNOSIS — Z1231 Encounter for screening mammogram for malignant neoplasm of breast: Secondary | ICD-10-CM | POA: Diagnosis present

## 2014-09-05 DIAGNOSIS — R928 Other abnormal and inconclusive findings on diagnostic imaging of breast: Secondary | ICD-10-CM | POA: Insufficient documentation

## 2014-09-05 DIAGNOSIS — M1712 Unilateral primary osteoarthritis, left knee: Secondary | ICD-10-CM

## 2014-09-05 DIAGNOSIS — M25669 Stiffness of unspecified knee, not elsewhere classified: Secondary | ICD-10-CM

## 2014-09-05 DIAGNOSIS — M25561 Pain in right knee: Secondary | ICD-10-CM

## 2014-09-05 DIAGNOSIS — M25562 Pain in left knee: Secondary | ICD-10-CM

## 2014-09-05 DIAGNOSIS — R269 Unspecified abnormalities of gait and mobility: Secondary | ICD-10-CM

## 2014-09-05 DIAGNOSIS — R262 Difficulty in walking, not elsewhere classified: Secondary | ICD-10-CM

## 2014-09-05 DIAGNOSIS — T8489XA Other specified complication of internal orthopedic prosthetic devices, implants and grafts, initial encounter: Secondary | ICD-10-CM

## 2014-09-05 DIAGNOSIS — Z471 Aftercare following joint replacement surgery: Secondary | ICD-10-CM | POA: Diagnosis not present

## 2014-09-05 NOTE — Telephone Encounter (Signed)
Patient picked up Rx

## 2014-09-05 NOTE — Therapy (Signed)
[facility] [address] [place], [place], [address] Phone: [phone]   Fax:  [phone]  Physical Therapy Treatment  Patient Details  Name: Sara [name] MRN: [id number] Date of Birth: 09-Mar-1946  Encounter Date: 09/05/2014      PT End of Session - 09/05/14 1432    Visit Number 5   Number of Visits 18   Date for PT Re-Evaluation 09/22/14   Authorization Type [employer] Medicare   Authorization - Visit Number 5   Authorization - Number of Visits 18   PT Start Time 1350   PT Stop Time 1430   PT Time Calculation (min) 40 min   Activity Tolerance Patient tolerated treatment well   Behavior During Therapy [facility] for tasks assessed/performed      Past Medical History  Diagnosis Date  . Thyroid disease   . Chronic back pain   . Reflux   . Unspecified arthropathy, shoulder region   . Pure hypercholesterolemia     takes Pravastin daily  . Allergic rhinitis due to pollen   . Cervicalgia   . Diverticulosis of colon (without mention of hemorrhage)   . Morbid obesity   . Palpitations   . Peptic ulcer, unspecified site, unspecified as acute or chronic, without mention of hemorrhage, perforation, or obstruction   . Peripheral vertigo, unspecified     takes Meclizine prn  . Reflux esophagitis   . Spinal stenosis in cervical region   . 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 Personal history of tobacco use, presenting hazards to health   . Insomnia, unspecified   . Lumbago   . Right leg pain   . Shortness of breath   . H/O hiatal hernia   . HTN (hypertension)     takes Metoprolol and Lisinopril daily  . Hypertension   . Sleep apnea     slight but doesn't require a CPAP  . History of bronchitis     > 42yr[age]  . Seasonal allergies     uses Flonase daily  . Headache(784.0)     denies migraines since the 80's but has occ and takes Butalbital prn  . Cervical spondylosis without  myelopathy     all over  . Degeneration of cervical intervertebral disc   . Primary localized osteoarthrosis, lower leg   . GERD (gastroesophageal reflux disease)     takes Omeprazole daily  . Constipation   . Diverticulosis   . History of bladder infections   . Urinary incontinence     occasionally  . Other postablative hypothyroidism     takes SYnthroid daily  . Insomnia     takes Ativan nigtly    Past Surgical History  Procedure Laterality Date  . Knee surgery      arthroscopy-- R  . Tonsillectomy    . Appendectomy    . Colonoscopy  Sept 2008    RMR: normal rectum, left-sided diverticula, repeat in 2018  . Abdominal hysterectomy    . Upper gastrointestinal endoscopy    . Breast lumpectomy      left   . Eye surgery      cataracts removed- bilateral, /w IOL  . Lumpectomy on pelvis    . Diagnostic laparoscopy    . Tubal ligation    . Esophagogastroduodenoscopy    . Anterior lat lumbar fusion  05/13/2012    Procedure: ANTERIOR LATERAL LUMBAR FUSION 2 LEVELS;  Surgeon: RLouie Casa  Sharlyne Cai, MD;  Location: [place] [facility];  Service: Neurosurgery;  Laterality: Left;  Left Lumbar Three-four,Lumbar four-five Extreme Lumbar Interbody Fusion with Percutaneous Pedicle Screws  . Spinal fusion    . Knee arthroscopy with lateral menisectomy Right 10/06/2013    Procedure: KNEE ARTHROSCOPY WITH LATERAL AND MEDIAL MENISECTOMY;  Surgeon: Carole Civil, MD;  Location: [facility];  Service: Orthopedics;  Laterality: Right;  END @ 1234  . Foreign body removal Right 10/06/2013    Procedure: REMOVAL FOREIGN BODY EXTREMITY;  Surgeon: Carole Civil, MD;  Location: [facility];  Service: Orthopedics;  Laterality: Right;  . Injection knee Left 10/06/2013    Procedure: KNEE INJECTION;  Surgeon: Carole Civil, MD;  Location: [facility];  Service: Orthopedics;  Laterality: Left;  . Total knee arthroplasty Right 01/24/2014    Procedure: TOTAL KNEE ARTHROPLASTY;  Surgeon: Carole Civil, MD;  Location: [facility];  Service: Orthopedics;  Laterality: Right;  . Total knee arthroplasty Left 08/08/2014    Procedure: TOTAL KNEE ARTHROPLASTY;  Surgeon: Carole Civil, MD;  Location: [facility];  Service: Orthopedics;  Laterality: Left;    There were no vitals taken for this visit.  Visit Diagnosis:  Primary osteoarthritis of left knee  Postoperative stiffness of total knee replacement, initial encounter  Left knee pain  Pain in joint, lower leg, left  Difficulty walking  Pain in joint, lower leg, right  Abnormality of gait  Left leg weakness      Subjective Assessment - 09/05/14 1408    Symptoms Knee feels stiff today, pain meds taken prior session today.  Current pain scale    Currently in Pain? Yes   Pain Score 2    Pain Location Knee   Pain Orientation Left   Pain Descriptors / Indicators Aching;Sore          [facility] PT Assessment - 09/05/14 0001    Assessment   Medical Diagnosis Lt TKA, lt knee stiffness, lt knee pain.    Onset Date 08/08/14   Next MD Visit Arther Abbott, December 28   Prior Therapy home health 3 visits          [facility] Adult PT Treatment/Exercise - 09/05/14 0001    Exercises   Exercises Knee/Hip   Knee/Hip Exercises: Stretches   Active Hamstring Stretch 3 reps;20 seconds   Active Hamstring Stretch Limitations 12" Step 3 way   Quad Stretch 3 reps;30 seconds   Quad Stretch Limitations prone with rope   Knee: Self-Stretch to increase Flexion 3 reps;20 seconds   Knee: Self-Stretch Limitations 14" box   Gastroc Stretch 3 reps;30 seconds   Gastroc Stretch Limitations Slantboard 3 way   Knee/Hip Exercises: Aerobic   Stationary Bike 8' seat 6 for knee flexion   Knee/Hip Exercises: Standing   Heel Raises 20 reps  no HHA   Heel Raises Limitations Toe Raises x20   Forward Lunges 10 reps;Left   Forward Lunges Limitations 12" step   Lateral Step Up Left;10 reps;Hand Hold: 2;Step Height: 2"   Forward Step Up 10 reps;Left;Hand Hold: 1;Step Height: 4"    Functional Squat 10 reps   Knee/Hip Exercises: Prone   Contract/Relax to Increase Flexion 3 sets            PT Short Term Goals - 09/05/14 1442    PT SHORT TERM GOAL #1   Title Patient will display increaseed knee flexion to 110 degrees to more easily squat to chair without weight shifting to Rt LE  Status Achieved   PT SHORT TERM GOAL #2   Title Patient will be able to demosntrate 5x sit to stand in < 15 seconds indicating patient not at high risk for falls.    Status On-going   PT SHORT TERM GOAL #3   Title Patient will demonstrate increased ankle dorsiflexion of >10 degrees.    Status On-going   PT SHORT TERM GOAL #4   Title Patient will report walking >33mnutes with tolerable pain.    Status On-going          PT Long Term Goals - 09/05/14 1442    PT LONG TERM GOAL #1   Title Patient will display increaseed knee flexion to 120 degrees to more easily squat to chair without weight shifting to Rt LE   Status On-going   PT LONG TERM GOAL #2   Title Patient will display increased knee flexion and extension strength on Lt to 5/5 MMT to be able to go up and down stairs without UE support.    Status On-going   PT LONG TERM GOAL #3   Title Patient will report walking >417mutes to get groceries with pain <2/10   Status On-going          Plan - 09/05/14 1433    Clinical Impression Statement Knee flexion progressing well, goal met today for flexion  AROM 118 degrees flexion.  Manual muscle energy techniques complete for knee flexion.  Progressed LE strengthening exercises with lunges and began lateral step ups with therapist facilitaiton for proper techniuqe.  Pt encourage to apply ice for pain and edema control at    PT Next Visit Plan Progress current PT POC.  Continue with forward/side lunges, squats, and progress stair training.  Continue ROM activities to assist with stiffness.        Problem List Patient Active Problem List   Diagnosis Date Noted  . S/P total knee  replacement 08/08/2014  . Difficulty in walking(719.7) 02/15/2014  . Postoperative stiffness of total knee replacement 02/15/2014  . Pain in joint, lower leg 02/15/2014  . Abnormality of gait 10/11/2013  . S/P arthroscopy of right knee 10/09/2013  . Radicular pain of right lower extremity 07/25/2013  . Knee instability 07/25/2013  . Right knee pain 07/25/2013  . Chest pain 04/25/2013  . Patellar tendonitis 09/07/2012  . OA (osteoarthritis) of knee 02/09/2012  . GERD (gastroesophageal reflux disease) 12/01/2011  . Dysphagia 12/01/2011  . RUQ pain 12/01/2011  . Fatty liver 12/01/2011  . Hematochezia 12/01/2011  . INTERMITTENT VERTIGO 09/12/2010  . KNEE, ARTHRITIS, DEGEN./OSTEO 04/03/2009  . CHEST PAIN, RECURRENT 03/07/2009  . OTHER DYSPHAGIA 03/07/2009  . PALPITATIONS 02/06/2009  . LIVER FUNCTION TESTS, ABNORMAL, HX OF 01/01/2009  . MORBID OBESITY 12/31/2008  . PUD 12/31/2008  . BACK PAIN 12/24/2008  . ANSERINE BURSITIS 12/24/2008  . BENIGN POSITIONAL VERTIGO 07/16/2008  . H N P-LUMBAR 04/02/2008  . SPINAL STENOSIS, CERVICAL 02/15/2008  . SPINAL STENOSIS, LUMBAR 02/15/2008  . Patellar tendinitis 10/19/2007  . TEAR LATERAL MENISCUS 06/13/2007  . OBESITY NOS 10/29/2006  . ALLERGIC RHINITIS, SEASONAL 10/29/2006  . HYPOTHYROIDISM 09/07/2006  . HYPERLIPIDEMIA 09/07/2006  . HYPERTENSION 09/07/2006  . GERD 09/07/2006  . DIVERTICULOSIS, COLON 09/07/2006  . OVERACTIVE BLADDER 09/07/2006  . FIBROCYSTIC BREAST DISEASE 09/07/2006  . OSTEOARTHRITIS 09/07/2006   CaIhor AustinLPMaricopaoAldona Lento2/16/2015, 2:43 PM

## 2014-09-05 NOTE — Telephone Encounter (Signed)
Prescription available, patient aware  

## 2014-09-06 ENCOUNTER — Other Ambulatory Visit: Payer: Self-pay | Admitting: Family Medicine

## 2014-09-06 DIAGNOSIS — R928 Other abnormal and inconclusive findings on diagnostic imaging of breast: Secondary | ICD-10-CM

## 2014-09-07 ENCOUNTER — Ambulatory Visit (HOSPITAL_COMMUNITY)
Admission: RE | Admit: 2014-09-07 | Discharge: 2014-09-07 | Disposition: A | Discharge status: Home / Self Care | Payer: PRIVATE HEALTH INSURANCE | Source: Ambulatory Visit | Attending: Orthopedic Surgery | Admitting: Orthopedic Surgery

## 2014-09-07 ENCOUNTER — Ambulatory Visit (HOSPITAL_COMMUNITY): Payer: PRIVATE HEALTH INSURANCE | Admitting: Physical Therapy

## 2014-09-07 DIAGNOSIS — M25669 Stiffness of unspecified knee, not elsewhere classified: Secondary | ICD-10-CM

## 2014-09-07 DIAGNOSIS — R29898 Other symptoms and signs involving the musculoskeletal system: Secondary | ICD-10-CM

## 2014-09-07 DIAGNOSIS — M25562 Pain in left knee: Secondary | ICD-10-CM

## 2014-09-07 DIAGNOSIS — R269 Unspecified abnormalities of gait and mobility: Secondary | ICD-10-CM

## 2014-09-07 DIAGNOSIS — M25561 Pain in right knee: Secondary | ICD-10-CM

## 2014-09-07 DIAGNOSIS — T8489XA Other specified complication of internal orthopedic prosthetic devices, implants and grafts, initial encounter: Secondary | ICD-10-CM

## 2014-09-07 DIAGNOSIS — Z471 Aftercare following joint replacement surgery: Secondary | ICD-10-CM | POA: Diagnosis not present

## 2014-09-07 DIAGNOSIS — R262 Difficulty in walking, not elsewhere classified: Secondary | ICD-10-CM

## 2014-09-07 DIAGNOSIS — M1712 Unilateral primary osteoarthritis, left knee: Secondary | ICD-10-CM

## 2014-09-07 DIAGNOSIS — Z96659 Presence of unspecified artificial knee joint: Secondary | ICD-10-CM

## 2014-09-07 NOTE — Therapy (Signed)
[place], [place], [address] Phone: [phone]   Fax:  [phone]  Physical Therapy Treatment  Patient Details  Name: Sara [name] MRN: [id number] Date of Birth: Oct 20, 1945  Encounter Date: 09/07/2014      PT End of Session - 09/07/14 0810    Visit Number 6   Number of Visits 18   Date for PT Re-Evaluation 09/22/14   Authorization Type [employer] Medicare   Authorization - Visit Number 6   Authorization - Number of Visits 18   PT Start Time 0805   PT Stop Time 0849   PT Time Calculation (min) 44 min   Activity Tolerance Patient tolerated treatment well   Behavior During Therapy [facility] for tasks assessed/performed      Past Medical History  Diagnosis Date  . Thyroid disease   . Chronic back pain   . Reflux   . Unspecified arthropathy, shoulder region   . Pure hypercholesterolemia     takes Pravastin daily  . Allergic rhinitis due to pollen   . Cervicalgia   . Diverticulosis of colon (without mention of hemorrhage)   . Morbid obesity   . Palpitations   . Peptic ulcer, unspecified site, unspecified as acute or chronic, without mention of hemorrhage, perforation, or obstruction   . Peripheral vertigo, unspecified     takes Meclizine prn  . Reflux esophagitis   . Spinal stenosis in cervical region   . 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 Personal history of tobacco use, presenting hazards to health   . Insomnia, unspecified   . Lumbago   . Right leg pain   . Shortness of breath   . H/O hiatal hernia   . HTN (hypertension)     takes Metoprolol and Lisinopril daily  . Hypertension   . Sleep apnea     slight but doesn't require a CPAP  . History of bronchitis     > 19yr ago  . Seasonal allergies     uses Flonase daily  . Headache(784.0)     denies migraines since the 80's but has occ and takes Butalbital prn  . Cervical  spondylosis without myelopathy     all over  . Degeneration of cervical intervertebral disc   . Primary localized osteoarthrosis, lower leg   . GERD (gastroesophageal reflux disease)     takes Omeprazole daily  . Constipation   . Diverticulosis   . History of bladder infections   . Urinary incontinence     occasionally  . Other postablative hypothyroidism     takes SYnthroid daily  . Insomnia     takes Ativan nigtly    Past Surgical History  Procedure Laterality Date  . Knee surgery      arthroscopy-- R  . Tonsillectomy    . Appendectomy    . Colonoscopy  Sept 2008    RMR: normal rectum, left-sided diverticula, repeat in 2018  . Abdominal hysterectomy    . Upper gastrointestinal endoscopy    . Breast lumpectomy      left   . Eye surgery      cataracts removed- bilateral, /w IOL  . Lumpectomy on pelvis    . Diagnostic laparoscopy    . Tubal ligation    . Esophagogastroduodenoscopy    . Anterior lat lumbar fusion  05/13/2012    Procedure: ANTERIOR LATERAL LUMBAR FUSION 2 LEVELS;  Surgeon: Faythe Ghee, MD;  Location: [facility];  Service: Neurosurgery;  Laterality: Left;  Left Lumbar Three-four,Lumbar four-five Extreme Lumbar Interbody Fusion with Percutaneous Pedicle Screws  . Spinal fusion    . Knee arthroscopy with lateral menisectomy Right 10/06/2013    Procedure: KNEE ARTHROSCOPY WITH LATERAL AND MEDIAL MENISECTOMY;  Surgeon: Carole Civil, MD;  Location: [facility];  Service: Orthopedics;  Laterality: Right;  END @ 1234  . Foreign body removal Right 10/06/2013    Procedure: REMOVAL FOREIGN BODY EXTREMITY;  Surgeon: Carole Civil, MD;  Location: [facility];  Service: Orthopedics;  Laterality: Right;  . Injection knee Left 10/06/2013    Procedure: KNEE INJECTION;  Surgeon: Carole Civil, MD;  Location: [facility];  Service: Orthopedics;  Laterality: Left;  . Total knee arthroplasty Right 01/24/2014    Procedure: TOTAL KNEE ARTHROPLASTY;  Surgeon: Carole Civil,  MD;  Location: [facility];  Service: Orthopedics;  Laterality: Right;  . Total knee arthroplasty Left 08/08/2014    Procedure: TOTAL KNEE ARTHROPLASTY;  Surgeon: Carole Civil, MD;  Location: [facility];  Service: Orthopedics;  Laterality: Left;    There were no vitals taken for this visit.  Visit Diagnosis:  Primary osteoarthritis of left knee  Postoperative stiffness of total knee replacement, initial encounter  Left knee pain  Pain in joint, lower leg, left  Difficulty walking  Pain in joint, lower leg, right  Abnormality of gait  Left leg weakness      Subjective Assessment - 09/07/14 0808    Symptoms Difficulty getting comfortable sleeping, knee pain scale 3/10.   Currently in Pain? Yes   Pain Score 3    Pain Location Knee   Pain Orientation Left   Pain Descriptors / Indicators Sore;Aching            [facility] Adult PT Treatment/Exercise - 09/07/14 0809    Exercises   Exercises Knee/Hip   Knee/Hip Exercises: Stretches   Active Hamstring Stretch 3 reps;30 seconds   Active Hamstring Stretch Limitations 12" Step 3 way   Quad Stretch 3 reps;30 seconds   Quad Stretch Limitations prone with rope   Knee: Self-Stretch Limitations 10 reps for knee flexion on 12" box   Gastroc Stretch 3 reps;30 seconds   Gastroc Stretch Limitations Slantboard 3 way   Knee/Hip Exercises: Aerobic   Stationary Bike 6' seat 7-->6 for knee flexion   Knee/Hip Exercises: Standing   Forward Lunges 10 reps;Left   Forward Lunges Limitations 6" step   Side Lunges Left;10 reps   Side Lunges Limitations 6in step   Lateral Step Up Left;15 reps;Hand Hold: 1;Step Height: 2"   Forward Step Up Left;20 reps;Hand Hold: 1;Step Height: 4"   Step Down Left;10 reps;Hand Hold: 1;Step Height: 2"   Functional Squat 15 reps   SLS Lt 7", Rt 10" max of 5.   Other Standing Knee Exercises Side Stepping 1 RTgreen tband           PT Short Term Goals - 09/07/14 0810    PT SHORT TERM GOAL #1   Title Patient  will display increaseed knee flexion to 110 degrees to more easily squat to chair without weight shifting to Rt LE   PT SHORT TERM GOAL #2   Title Patient will be able to demosntrate 5x sit to stand in < 15 seconds indicating patient not at high risk for falls.    Status On-going   PT SHORT TERM GOAL #3   Title Patient will  demonstrate increased ankle dorsiflexion of >10 degrees.    Status On-going   PT SHORT TERM GOAL #4   Title Patient will report walking >21minutes with tolerable pain.    Status On-going           PT Long Term Goals - 09/07/14 4098    PT LONG TERM GOAL #1   Title Patient will display increaseed knee flexion to 120 degrees to more easily squat to chair without weight shifting to Rt LE   Status On-going   PT LONG TERM GOAL #2   Title Patient will display increased knee flexion and extension strength on Lt to 5/5 MMT to be able to go up and down stairs without UE support.    Status On-going   PT LONG TERM GOAL #3   Title Patient will report walking >82minutes to get groceries with pain <2/10               Plan - 09/07/14 0810    Clinical Impression Statement Session focus continued ROM initially this session to reduce stiffness and increase flexion.  Progressed functional strengthening activities with increased reps for activity tolerance and began step down for eccentric quad control, SLS and sidestepping activities for glut med strengthening.  Minimal cueing required following demonstration for form and technique with all exercises.  No reports of increased pain through session, pt plans to apply ice at home for pain and edema control.     PT Next Visit Plan Progress current PT POC.  Continue with forward/side lunges, squats, and progress stair training.  Continue ROM activities to assist with stiffness.          Problem List Patient Active Problem List   Diagnosis Date Noted  . S/P total knee replacement 08/08/2014  . Difficulty in walking(719.7)  02/15/2014  . Postoperative stiffness of total knee replacement 02/15/2014  . Pain in joint, lower leg 02/15/2014  . Abnormality of gait 10/11/2013  . S/P arthroscopy of right knee 10/09/2013  . Radicular pain of right lower extremity 07/25/2013  . Knee instability 07/25/2013  . Right knee pain 07/25/2013  . Chest pain 04/25/2013  . Patellar tendonitis 09/07/2012  . OA (osteoarthritis) of knee 02/09/2012  . GERD (gastroesophageal reflux disease) 12/01/2011  . Dysphagia 12/01/2011  . RUQ pain 12/01/2011  . Fatty liver 12/01/2011  . Hematochezia 12/01/2011  . INTERMITTENT VERTIGO 09/12/2010  . KNEE, ARTHRITIS, DEGEN./OSTEO 04/03/2009  . CHEST PAIN, RECURRENT 03/07/2009  . OTHER DYSPHAGIA 03/07/2009  . PALPITATIONS 02/06/2009  . LIVER FUNCTION TESTS, ABNORMAL, HX OF 01/01/2009  . MORBID OBESITY 12/31/2008  . PUD 12/31/2008  . BACK PAIN 12/24/2008  . ANSERINE BURSITIS 12/24/2008  . BENIGN POSITIONAL VERTIGO 07/16/2008  . H N P-LUMBAR 04/02/2008  . SPINAL STENOSIS, CERVICAL 02/15/2008  . SPINAL STENOSIS, LUMBAR 02/15/2008  . Patellar tendinitis 10/19/2007  . TEAR LATERAL MENISCUS 06/13/2007  . OBESITY NOS 10/29/2006  . ALLERGIC RHINITIS, SEASONAL 10/29/2006  . HYPOTHYROIDISM 09/07/2006  . HYPERLIPIDEMIA 09/07/2006  . HYPERTENSION 09/07/2006  . GERD 09/07/2006  . DIVERTICULOSIS, COLON 09/07/2006  . OVERACTIVE BLADDER 09/07/2006  . FIBROCYSTIC BREAST DISEASE 09/07/2006  . OSTEOARTHRITIS 09/07/2006   Ihor Austin, Bell Arthur Aldona Lento 09/07/2014, 9:20 AM  [place], [place], [address] Phone: [phone]   Fax:  [phone]

## 2014-09-10 ENCOUNTER — Ambulatory Visit (HOSPITAL_COMMUNITY)
Admission: RE | Admit: 2014-09-10 | Discharge: 2014-09-10 | Disposition: A | Discharge status: Home / Self Care | Payer: PRIVATE HEALTH INSURANCE | Source: Ambulatory Visit | Attending: Family Medicine | Admitting: Family Medicine

## 2014-09-10 DIAGNOSIS — Z96659 Presence of unspecified artificial knee joint: Secondary | ICD-10-CM

## 2014-09-10 DIAGNOSIS — R29898 Other symptoms and signs involving the musculoskeletal system: Secondary | ICD-10-CM

## 2014-09-10 DIAGNOSIS — M25669 Stiffness of unspecified knee, not elsewhere classified: Secondary | ICD-10-CM

## 2014-09-10 DIAGNOSIS — R262 Difficulty in walking, not elsewhere classified: Secondary | ICD-10-CM

## 2014-09-10 DIAGNOSIS — M25562 Pain in left knee: Secondary | ICD-10-CM

## 2014-09-10 DIAGNOSIS — R269 Unspecified abnormalities of gait and mobility: Secondary | ICD-10-CM

## 2014-09-10 DIAGNOSIS — T8489XA Other specified complication of internal orthopedic prosthetic devices, implants and grafts, initial encounter: Secondary | ICD-10-CM

## 2014-09-10 DIAGNOSIS — Z471 Aftercare following joint replacement surgery: Secondary | ICD-10-CM | POA: Diagnosis not present

## 2014-09-10 DIAGNOSIS — M25561 Pain in right knee: Secondary | ICD-10-CM

## 2014-09-10 DIAGNOSIS — M1712 Unilateral primary osteoarthritis, left knee: Secondary | ICD-10-CM

## 2014-09-10 NOTE — Therapy (Signed)
[place], [place], [address] Phone: [phone]   Fax:  [phone]  Physical Therapy Treatment  Patient Details  Name: Sara [name] MRN: [id number] Date of Birth: 1945-11-01  Encounter Date: 09/10/2014      PT End of Session - 09/10/14 1556    Visit Number 7   Number of Visits 18   Date for PT Re-Evaluation 09/22/14   Authorization Type [employer] Medicare   Authorization - Visit Number 7   Authorization - Number of Visits 18   PT Start Time 2637   PT Stop Time 1600   PT Time Calculation (min) 43 min   Activity Tolerance Patient tolerated treatment well   Behavior During Therapy [facility] for tasks assessed/performed      Past Medical History  Diagnosis Date  . Thyroid disease   . Chronic back pain   . Reflux   . Unspecified arthropathy, shoulder region   . Pure hypercholesterolemia     takes Pravastin daily  . Allergic rhinitis due to pollen   . Cervicalgia   . Diverticulosis of colon (without mention of hemorrhage)   . Morbid obesity   . Palpitations   . Peptic ulcer, unspecified site, unspecified as acute or chronic, without mention of hemorrhage, perforation, or obstruction   . Peripheral vertigo, unspecified     takes Meclizine prn  . Reflux esophagitis   . Spinal stenosis in cervical region   . 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 Personal history of tobacco use, presenting hazards to health   . Insomnia, unspecified   . Lumbago   . Right leg pain   . Shortness of breath   . H/O hiatal hernia   . HTN (hypertension)     takes Metoprolol and Lisinopril daily  . Hypertension   . Sleep apnea     slight but doesn't require a CPAP  . History of bronchitis     > 24yr ago  . Seasonal allergies     uses Flonase daily  . Headache(784.0)     denies migraines since the 80's but has occ and takes Butalbital prn  . Cervical  spondylosis without myelopathy     all over  . Degeneration of cervical intervertebral disc   . Primary localized osteoarthrosis, lower leg   . GERD (gastroesophageal reflux disease)     takes Omeprazole daily  . Constipation   . Diverticulosis   . History of bladder infections   . Urinary incontinence     occasionally  . Other postablative hypothyroidism     takes SYnthroid daily  . Insomnia     takes Ativan nigtly    Past Surgical History  Procedure Laterality Date  . Knee surgery      arthroscopy-- R  . Tonsillectomy    . Appendectomy    . Colonoscopy  Sept 2008    RMR: normal rectum, left-sided diverticula, repeat in 2018  . Abdominal hysterectomy    . Upper gastrointestinal endoscopy    . Breast lumpectomy      left   . Eye surgery      cataracts removed- bilateral, /w IOL  . Lumpectomy on pelvis    . Diagnostic laparoscopy    . Tubal ligation    . Esophagogastroduodenoscopy    . Anterior lat lumbar fusion  05/13/2012    Procedure: ANTERIOR LATERAL LUMBAR FUSION 2 LEVELS;  Surgeon: Faythe Ghee, MD;  Location: [facility];  Service: Neurosurgery;  Laterality: Left;  Left Lumbar Three-four,Lumbar four-five Extreme Lumbar Interbody Fusion with Percutaneous Pedicle Screws  . Spinal fusion    . Knee arthroscopy with lateral menisectomy Right 10/06/2013    Procedure: KNEE ARTHROSCOPY WITH LATERAL AND MEDIAL MENISECTOMY;  Surgeon: Carole Civil, MD;  Location: [facility];  Service: Orthopedics;  Laterality: Right;  END @ 1234  . Foreign body removal Right 10/06/2013    Procedure: REMOVAL FOREIGN BODY EXTREMITY;  Surgeon: Carole Civil, MD;  Location: [facility];  Service: Orthopedics;  Laterality: Right;  . Injection knee Left 10/06/2013    Procedure: KNEE INJECTION;  Surgeon: Carole Civil, MD;  Location: [facility];  Service: Orthopedics;  Laterality: Left;  . Total knee arthroplasty Right 01/24/2014    Procedure: TOTAL KNEE ARTHROPLASTY;  Surgeon: Carole Civil,  MD;  Location: [facility];  Service: Orthopedics;  Laterality: Right;  . Total knee arthroplasty Left 08/08/2014    Procedure: TOTAL KNEE ARTHROPLASTY;  Surgeon: Carole Civil, MD;  Location: [facility];  Service: Orthopedics;  Laterality: Left;    There were no vitals taken for this visit.  Visit Diagnosis:  Primary osteoarthritis of left knee  Postoperative stiffness of total knee replacement, initial encounter  Left knee pain  Pain in joint, lower leg, left  Difficulty walking  Pain in joint, lower leg, right  Abnormality of gait  Left leg weakness      Subjective Assessment - 09/10/14 1522    Symptoms Pt reports only mild pain/discomfort  in the Lt knee secondary to stiffness.     Currently in Pain? Yes   Pain Score 1    Pain Location Knee   Pain Orientation Left          [facility] PT Assessment - 09/10/14 1517    Assessment   Medical Diagnosis Lt TKA, lt knee stiffness, lt knee pain.    Onset Date 08/08/14   Next MD Visit Arther Abbott, December 28   Flexibility   Soft Tissue Assessment /Muscle Lenght yes   Hamstrings 90/90: Rt 90, Lt 94 degrees   Quadriceps Rt 7 1/2 in, Lt 9 inch                  [facility] Adult PT Treatment/Exercise - 09/10/14 1518    Exercises   Exercises Knee/Hip   Knee/Hip Exercises: Stretches   Active Hamstring Stretch 3 reps;20 seconds   Active Hamstring Stretch Limitations 14" box, 3 way   Quad Stretch 3 reps;30 seconds   Quad Stretch Limitations prone with rope   Knee: Self-Stretch Limitations 10 reps for knee flexion on 14" box   Gastroc Stretch 3 reps;30 seconds   Gastroc Stretch Limitations Slantboard 3 way   Knee/Hip Exercises: Aerobic   Stationary Bike 5' seat 6->5 working on knee flexion   Knee/Hip Exercises: Standing   Heel Raises 10 reps   Heel Raises Limitations with squat to 14" box with red ball   Lateral Step Up Left;10 reps;Hand Hold: 1;Step Height: 6"   Forward Step Up Left;10 reps;Hand Hold: 1;Step Height:  6"   Step Down Left;15 reps;Hand Hold: 0;Step Height: 2"   Functional Squat 10 reps   Functional Squat Limitations see above   Knee/Hip Exercises: Supine   Bridges 10 reps;Left   Bridges Limitations single leg bridge                  PT Short  Term Goals - 09/07/14 0810    PT SHORT TERM GOAL #1   Title Patient will display increaseed knee flexion to 110 degrees to more easily squat to chair without weight shifting to Rt LE   PT SHORT TERM GOAL #2   Title Patient will be able to demosntrate 5x sit to stand in < 15 seconds indicating patient not at high risk for falls.    Status On-going   PT SHORT TERM GOAL #3   Title Patient will demonstrate increased ankle dorsiflexion of >10 degrees.    Status On-going   PT SHORT TERM GOAL #4   Title Patient will report walking >25minutes with tolerable pain.    Status On-going           PT Long Term Goals - 09/07/14 7425    PT LONG TERM GOAL #1   Title Patient will display increaseed knee flexion to 120 degrees to more easily squat to chair without weight shifting to Rt LE   Status On-going   PT LONG TERM GOAL #2   Title Patient will display increased knee flexion and extension strength on Lt to 5/5 MMT to be able to go up and down stairs without UE support.    Status On-going   PT LONG TERM GOAL #3   Title Patient will report walking >70minutes to get groceries with pain <2/10               Plan - 09/10/14 1556    Clinical Impression Statement Continued session focusing on ROM > strength, as pt is going for MD visit next week.  Flexibility measurements updated in preparation for visit, with notable improvmeents in quad length compared to last measurement, though limitations continue compared to opposite leg.  Visible muscle shaking with squat/HR exercise by end of treatment session secondary to musclar fatigue.     Pt will benefit from skilled therapeutic intervention in order to improve on the following deficits Abnormal  gait;Decreased strength;Pain;Difficulty walking;Decreased mobility;Decreased activity tolerance;Decreased balance;Decreased range of motion;Increased fascial restricitons;Improper body mechanics;Impaired flexibility;Decreased endurance   PT Treatment/Interventions Therapeutic exercise;Balance training;Patient/family education;Gait training;Manual techniques;Stair training;Passive range of motion   PT Next Visit Plan MD Note next visit        Problem List Patient Active Problem List   Diagnosis Date Noted  . S/P total knee replacement 08/08/2014  . Difficulty in walking(719.7) 02/15/2014  . Postoperative stiffness of total knee replacement 02/15/2014  . Pain in joint, lower leg 02/15/2014  . Abnormality of gait 10/11/2013  . S/P arthroscopy of right knee 10/09/2013  . Radicular pain of right lower extremity 07/25/2013  . Knee instability 07/25/2013  . Right knee pain 07/25/2013  . Chest pain 04/25/2013  . Patellar tendonitis 09/07/2012  . OA (osteoarthritis) of knee 02/09/2012  . GERD (gastroesophageal reflux disease) 12/01/2011  . Dysphagia 12/01/2011  . RUQ pain 12/01/2011  . Fatty liver 12/01/2011  . Hematochezia 12/01/2011  . INTERMITTENT VERTIGO 09/12/2010  . KNEE, ARTHRITIS, DEGEN./OSTEO 04/03/2009  . CHEST PAIN, RECURRENT 03/07/2009  . OTHER DYSPHAGIA 03/07/2009  . PALPITATIONS 02/06/2009  . LIVER FUNCTION TESTS, ABNORMAL, HX OF 01/01/2009  . MORBID OBESITY 12/31/2008  . PUD 12/31/2008  . BACK PAIN 12/24/2008  . ANSERINE BURSITIS 12/24/2008  . BENIGN POSITIONAL VERTIGO 07/16/2008  . H N P-LUMBAR 04/02/2008  . SPINAL STENOSIS, CERVICAL 02/15/2008  . SPINAL STENOSIS, LUMBAR 02/15/2008  . Patellar tendinitis 10/19/2007  . TEAR LATERAL MENISCUS 06/13/2007  . OBESITY NOS 10/29/2006  . ALLERGIC RHINITIS,  SEASONAL 10/29/2006  . HYPOTHYROIDISM 09/07/2006  . HYPERLIPIDEMIA 09/07/2006  . HYPERTENSION 09/07/2006  . GERD 09/07/2006  . DIVERTICULOSIS, COLON 09/07/2006  .  OVERACTIVE BLADDER 09/07/2006  . FIBROCYSTIC BREAST DISEASE 09/07/2006  . OSTEOARTHRITIS 09/07/2006    Aniesha Haughn 09/10/2014, 3:59 PM  [place], [place], [address] Phone: [phone]   Fax:  [phone]

## 2014-09-12 ENCOUNTER — Ambulatory Visit (HOSPITAL_COMMUNITY)
Admission: RE | Admit: 2014-09-12 | Discharge: 2014-09-12 | Disposition: A | Discharge status: Home / Self Care | Payer: PRIVATE HEALTH INSURANCE | Source: Ambulatory Visit | Attending: Family Medicine | Admitting: Family Medicine

## 2014-09-12 ENCOUNTER — Encounter (HOSPITAL_COMMUNITY): Payer: Self-pay

## 2014-09-12 DIAGNOSIS — M1712 Unilateral primary osteoarthritis, left knee: Secondary | ICD-10-CM

## 2014-09-12 DIAGNOSIS — R262 Difficulty in walking, not elsewhere classified: Secondary | ICD-10-CM

## 2014-09-12 DIAGNOSIS — M25669 Stiffness of unspecified knee, not elsewhere classified: Secondary | ICD-10-CM

## 2014-09-12 DIAGNOSIS — Z96659 Presence of unspecified artificial knee joint: Secondary | ICD-10-CM

## 2014-09-12 DIAGNOSIS — Z471 Aftercare following joint replacement surgery: Secondary | ICD-10-CM | POA: Diagnosis not present

## 2014-09-12 DIAGNOSIS — R29898 Other symptoms and signs involving the musculoskeletal system: Secondary | ICD-10-CM

## 2014-09-12 DIAGNOSIS — M25561 Pain in right knee: Secondary | ICD-10-CM

## 2014-09-12 DIAGNOSIS — T8489XA Other specified complication of internal orthopedic prosthetic devices, implants and grafts, initial encounter: Secondary | ICD-10-CM

## 2014-09-12 DIAGNOSIS — R269 Unspecified abnormalities of gait and mobility: Secondary | ICD-10-CM

## 2014-09-12 DIAGNOSIS — M25562 Pain in left knee: Secondary | ICD-10-CM

## 2014-09-12 NOTE — Therapy (Signed)
[place], [place], [address] Phone: [phone]   Fax:  [phone]  Physical Therapy Reassessment  Patient Details  Name: Sara [name] MRN: [id number] Date of Birth: 1945-12-30  Encounter Date: 09/12/2014      PT End of Session - 09/12/14 1529    Visit Number 8   Number of Visits 18   Date for PT Re-Evaluation 10/10/13   Authorization Type [employer] Medicare   Authorization - Visit Number 8   Authorization - Number of Visits 18   PT Start Time 4081   PT Stop Time 1515   PT Time Calculation (min) 50 min   Activity Tolerance Patient tolerated treatment well   Behavior During Therapy [facility] for tasks assessed/performed      Past Medical History  Diagnosis Date  . Thyroid disease   . Chronic back pain   . Reflux   . Unspecified arthropathy, shoulder region   . Pure hypercholesterolemia     takes Pravastin daily  . Allergic rhinitis due to pollen   . Cervicalgia   . Diverticulosis of colon (without mention of hemorrhage)   . Morbid obesity   . Palpitations   . Peptic ulcer, unspecified site, unspecified as acute or chronic, without mention of hemorrhage, perforation, or obstruction   . Peripheral vertigo, unspecified     takes Meclizine prn  . Reflux esophagitis   . Spinal stenosis in cervical region   . 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 Personal history of tobacco use, presenting hazards to health   . Insomnia, unspecified   . Lumbago   . Right leg pain   . Shortness of breath   . H/O hiatal hernia   . HTN (hypertension)     takes Metoprolol and Lisinopril daily  . Hypertension   . Sleep apnea     slight but doesn't require a CPAP  . History of bronchitis     > 4yr ago  . Seasonal allergies     uses Flonase daily  . Headache(784.0)     denies migraines since the 80's but has occ and takes Butalbital prn  . Cervical  spondylosis without myelopathy     all over  . Degeneration of cervical intervertebral disc   . Primary localized osteoarthrosis, lower leg   . GERD (gastroesophageal reflux disease)     takes Omeprazole daily  . Constipation   . Diverticulosis   . History of bladder infections   . Urinary incontinence     occasionally  . Other postablative hypothyroidism     takes SYnthroid daily  . Insomnia     takes Ativan nigtly    Past Surgical History  Procedure Laterality Date  . Knee surgery      arthroscopy-- R  . Tonsillectomy    . Appendectomy    . Colonoscopy  Sept 2008    RMR: normal rectum, left-sided diverticula, repeat in 2018  . Abdominal hysterectomy    . Upper gastrointestinal endoscopy    . Breast lumpectomy      left   . Eye surgery      cataracts removed- bilateral, /w IOL  . Lumpectomy on pelvis    . Diagnostic laparoscopy    . Tubal ligation    . Esophagogastroduodenoscopy    . Anterior lat lumbar fusion  05/13/2012    Procedure: ANTERIOR LATERAL LUMBAR FUSION 2 LEVELS;  Surgeon: Faythe Ghee, MD;  Location: [facility];  Service: Neurosurgery;  Laterality: Left;  Left Lumbar Three-four,Lumbar four-five Extreme Lumbar Interbody Fusion with Percutaneous Pedicle Screws  . Spinal fusion    . Knee arthroscopy with lateral menisectomy Right 10/06/2013    Procedure: KNEE ARTHROSCOPY WITH LATERAL AND MEDIAL MENISECTOMY;  Surgeon: Carole Civil, MD;  Location: [facility];  Service: Orthopedics;  Laterality: Right;  END @ 1234  . Foreign body removal Right 10/06/2013    Procedure: REMOVAL FOREIGN BODY EXTREMITY;  Surgeon: Carole Civil, MD;  Location: [facility];  Service: Orthopedics;  Laterality: Right;  . Injection knee Left 10/06/2013    Procedure: KNEE INJECTION;  Surgeon: Carole Civil, MD;  Location: [facility];  Service: Orthopedics;  Laterality: Left;  . Total knee arthroplasty Right 01/24/2014    Procedure: TOTAL KNEE ARTHROPLASTY;  Surgeon: Carole Civil,  MD;  Location: [facility];  Service: Orthopedics;  Laterality: Right;  . Total knee arthroplasty Left 08/08/2014    Procedure: TOTAL KNEE ARTHROPLASTY;  Surgeon: Carole Civil, MD;  Location: [facility];  Service: Orthopedics;  Laterality: Left;    There were no vitals taken for this visit.  Visit Diagnosis:  Primary osteoarthritis of left knee  Postoperative stiffness of total knee replacement, initial encounter  Left knee pain  Pain in joint, lower leg, left  Difficulty walking  Pain in joint, lower leg, right  Abnormality of gait  Left leg weakness  Right knee pain      Subjective Assessment - 09/12/14 1435    Symptoms Pt stated currently pain free with pain medication taken prior session.  Pt reports she has another apt next week for another mamogram.  Pt stated biggest problem has been knee waking her during night due to pain, able to sleep for 3 hours.     How long can you stand comfortably? Able to stand comforably for 15-20 minutes <15 minutes   How long can you walk comfortably? Able to walk for 15-20 minutes <15 minutes   Currently in Pain? No/denies          [facility] PT Assessment - 09/12/14 1442    Assessment   Medical Diagnosis Lt TKA, lt knee stiffness, lt knee pain.    Onset Date 08/08/14   Next MD Visit Arther Abbott, December 28   Prior Therapy home health 3 visits   AROM   Left Hip External Rotation  55   Left Hip Internal Rotation  58  was 25   Left Knee Extension 0  was 0   Left Knee Flexion 120  was 89   Left Ankle Dorsiflexion 7  was -3   Strength   Left Hip Flexion 4/5  was 3+/5   Left Hip Extension 4/5   Left Knee Flexion 5/5  4-/5   Left Knee Extension 4/5  4-/5   Left Ankle Dorsiflexion --  4+/5 was 4/5             [facility] Adult PT Treatment/Exercise - 09/12/14 1447    Exercises   Exercises Knee/Hip   Knee/Hip Exercises: Stretches   Active Hamstring Stretch 3 reps;30 seconds   Active Hamstring Stretch Limitations 14" box,  3 way   Knee: Self-Stretch Limitations 10 reps for knee flexion on 14" box   Gastroc Stretch 3 reps;30 seconds   Gastroc Stretch Limitations Slantboard 3 way   Knee/Hip Exercises: Aerobic   Stationary Bike 6' seat 6->5 working on knee flexion  Knee/Hip Exercises: Standing   Heel Raises 10 reps   Heel Raises Limitations with squat to 14" box with red ball   Lateral Step Up Left;10 reps;Hand Hold: 1;Step Height: 6"   Forward Step Up Left;Hand Hold: 1;Step Height: 6";15 reps   Step Down Left;10 reps;Hand Hold: 1;Step Height: 4"   Stairs 4 and 7in 1RT reciprocal each height with 1 HR   Knee/Hip Exercises: Seated   Other Seated Knee Exercises 5 STS no HHA in 6.32"   Knee/Hip Exercises: Supine   Other Supine Knee Exercises ankle             PT Short Term Goals - 09/12/14 1439    PT SHORT TERM GOAL #1   Title Patient will display increaseed knee flexion to 110 degrees to more easily squat to chair without weight shifting to Rt LE   Status Achieved   PT SHORT TERM GOAL #2   Title Patient will be able to demosntrate 5x sit to stand in < 15 seconds indicating patient not at high risk for falls.    Baseline 6.32"   Status Achieved   PT SHORT TERM GOAL #3   Title Patient will demonstrate increased ankle dorsiflexion of >10 degrees.    Baseline 7 degrees   Status On-going   PT SHORT TERM GOAL #4   Title Patient will report walking >73minutes with tolerable pain.    Baseline 15-20 minutes on 09/12/2014   Status On-going           PT Long Term Goals - 09/12/14 1516    PT LONG TERM GOAL #1   Title Patient will display increaseed knee flexion to 120 degrees to more easily squat to chair without weight shifting to Rt LE   Status Achieved   PT LONG TERM GOAL #2   Title Patient will display increased knee flexion and extension strength on Lt to 5/5 MMT to be able to go up and down stairs without UE support.    Status On-going   PT LONG TERM GOAL #3   Title Patient will report  walking >35minutes to get groceries with pain <2/10   Status On-going               Plan - 09/12/14 1555    Clinical Impression Statement Reviewed goals and complete MMT/ROM Measurement prior MD apt nesxt week.  Pt has met 2/4 STG and 1/3 LTGs.  Improved AROM 0-120.  Strength improved for all musculature tested.  Pt ability to complete 5 sit to stand in 6.32" without HHA and able to demonstrate appropriate mechanics with reciprocal pattern on 4 and 7in stair height with 1 HR assistance.  Noted weak eccentric control while descending stairs.   PT Next Visit Plan Recommend continuing OPPT for 4 more weeks to improve functional strengthening and functional activity tolerance with standing/walking towards all goals.           Problem List Patient Active Problem List   Diagnosis Date Noted  . S/P total knee replacement 08/08/2014  . Difficulty in walking(719.7) 02/15/2014  . Postoperative stiffness of total knee replacement 02/15/2014  . Pain in joint, lower leg 02/15/2014  . Abnormality of gait 10/11/2013  . S/P arthroscopy of right knee 10/09/2013  . Radicular pain of right lower extremity 07/25/2013  . Knee instability 07/25/2013  . Right knee pain 07/25/2013  . Chest pain 04/25/2013  . Patellar tendonitis 09/07/2012  . OA (osteoarthritis) of knee 02/09/2012  . GERD (gastroesophageal reflux disease)  12/01/2011  . Dysphagia 12/01/2011  . RUQ pain 12/01/2011  . Fatty liver 12/01/2011  . Hematochezia 12/01/2011  . INTERMITTENT VERTIGO Oct 10, 2010  . KNEE, ARTHRITIS, DEGEN./OSTEO 04/03/2009  . CHEST PAIN, RECURRENT 03/07/2009  . OTHER DYSPHAGIA 03/07/2009  . PALPITATIONS 02/06/2009  . LIVER FUNCTION TESTS, ABNORMAL, HX OF 01/01/2009  . MORBID OBESITY 12/31/2008  . PUD 12/31/2008  . BACK PAIN 12/24/2008  . ANSERINE BURSITIS 12/24/2008  . BENIGN POSITIONAL VERTIGO 07/16/2008  . H N P-LUMBAR 04/02/2008  . SPINAL STENOSIS, CERVICAL 02/15/2008  . SPINAL STENOSIS, LUMBAR  02/15/2008  . Patellar tendinitis 10/19/2007  . TEAR LATERAL MENISCUS 06/13/2007  . OBESITY NOS 10/29/2006  . ALLERGIC RHINITIS, SEASONAL 10/29/2006  . HYPOTHYROIDISM 09/07/2006  . HYPERLIPIDEMIA 09/07/2006  . HYPERTENSION 09/07/2006  . GERD 09/07/2006  . DIVERTICULOSIS, COLON 09/07/2006  . OVERACTIVE BLADDER 09/07/2006  . FIBROCYSTIC BREAST DISEASE 09/07/2006  . OSTEOARTHRITIS 09/07/2006   Ihor Austin, [place]  Aldona Lento 2014-10-10, 4:13 PM      G-Codes - 2014/10/10 1612    Functional Assessment Tool Used Clinical judgement   Functional Limitation Mobility: Walking and moving around   Mobility: Walking and Moving Around Current Status [phone]) At least 20 percent but less than 40 percent impaired, limited or restricted   Mobility: Walking and Moving Around Goal Status [phone]) At least 1 percent but less than 20 percent impaired, limited or restricted     Devona Konig PT DPT [phone]

## 2014-09-17 ENCOUNTER — Other Ambulatory Visit: Payer: Self-pay | Admitting: *Deleted

## 2014-09-17 ENCOUNTER — Ambulatory Visit (INDEPENDENT_AMBULATORY_CARE_PROVIDER_SITE_OTHER): Payer: Self-pay | Admitting: Orthopedic Surgery

## 2014-09-17 VITALS — BP 96/57 | Ht 66.0 in | Wt 217.0 lb

## 2014-09-17 DIAGNOSIS — Z96652 Presence of left artificial knee joint: Secondary | ICD-10-CM

## 2014-09-17 DIAGNOSIS — M1712 Unilateral primary osteoarthritis, left knee: Secondary | ICD-10-CM

## 2014-09-17 MED ORDER — OXYCODONE-ACETAMINOPHEN 10-325 MG PO TABS: 1.0000 | ORAL_TABLET | ORAL | Status: DC | PRN

## 2014-09-17 NOTE — Progress Notes (Signed)
Patient ID: Sara [name], female   DOB: 08-22-1946, 68 y.o.   MRN: [id number]  Chief Complaint  Patient presents with  . Follow-up    4 week recheck on left TKA, DOS 08-08-14.   BP 96/57 mmHg  Ht 5\' 6"  (1.676 m)  Wt 217 lb (98.431 kg)  BMI 35.04 kg/m2  Encounter Diagnoses  Name Primary?  . Status post total left knee replacement Yes  . Primary osteoarthritis of left knee     Knee flexion 1:15 knee extension 0 Complaints pain Current medication oxycodone 10 mg every 4 when necessary  Knee looks good everything looks great she is using a cane  Continue therapy we will continue oxycodone 10 until her pain gets a little bit better  Continue therapy follow-up one month

## 2014-09-18 ENCOUNTER — Ambulatory Visit (HOSPITAL_COMMUNITY)
Admission: RE | Admit: 2014-09-18 | Discharge: 2014-09-18 | Disposition: A | Discharge status: Home / Self Care | Payer: PRIVATE HEALTH INSURANCE | Source: Ambulatory Visit | Attending: Orthopedic Surgery | Admitting: Orthopedic Surgery

## 2014-09-18 ENCOUNTER — Encounter (HOSPITAL_COMMUNITY): Payer: Self-pay

## 2014-09-18 ENCOUNTER — Ambulatory Visit (HOSPITAL_COMMUNITY)
Admission: RE | Admit: 2014-09-18 | Discharge: 2014-09-18 | Disposition: A | Discharge status: Home / Self Care | Payer: PRIVATE HEALTH INSURANCE | Source: Ambulatory Visit | Attending: Family Medicine | Admitting: Family Medicine

## 2014-09-18 ENCOUNTER — Other Ambulatory Visit: Payer: Self-pay | Admitting: Family Medicine

## 2014-09-18 DIAGNOSIS — M25562 Pain in left knee: Secondary | ICD-10-CM

## 2014-09-18 DIAGNOSIS — R928 Other abnormal and inconclusive findings on diagnostic imaging of breast: Secondary | ICD-10-CM

## 2014-09-18 DIAGNOSIS — Z471 Aftercare following joint replacement surgery: Secondary | ICD-10-CM | POA: Diagnosis not present

## 2014-09-18 DIAGNOSIS — T8489XA Other specified complication of internal orthopedic prosthetic devices, implants and grafts, initial encounter: Secondary | ICD-10-CM

## 2014-09-18 DIAGNOSIS — M1712 Unilateral primary osteoarthritis, left knee: Secondary | ICD-10-CM

## 2014-09-18 DIAGNOSIS — M25561 Pain in right knee: Secondary | ICD-10-CM

## 2014-09-18 DIAGNOSIS — M25669 Stiffness of unspecified knee, not elsewhere classified: Secondary | ICD-10-CM

## 2014-09-18 DIAGNOSIS — R29898 Other symptoms and signs involving the musculoskeletal system: Secondary | ICD-10-CM

## 2014-09-18 DIAGNOSIS — Z96659 Presence of unspecified artificial knee joint: Secondary | ICD-10-CM

## 2014-09-18 DIAGNOSIS — R262 Difficulty in walking, not elsewhere classified: Secondary | ICD-10-CM

## 2014-09-18 DIAGNOSIS — R269 Unspecified abnormalities of gait and mobility: Secondary | ICD-10-CM

## 2014-09-18 NOTE — Therapy (Signed)
[place], [place], [address] Phone: [phone]   Fax:  [phone]  Physical Therapy Treatment  Patient Details  Name: Sara [name] MRN: [id number] Date of Birth: [birth year]  Encounter Date: 09/18/2014      PT End of Session - 09/18/14 1510    Visit Number 9   Number of Visits 18   Date for PT Re-Evaluation 10/10/13   Authorization Type [employer] Medicare   Authorization - Visit Number 9   Authorization - Number of Visits 18   PT Start Time 8182   PT Stop Time 1500   PT Time Calculation (min) 28 min   Activity Tolerance Patient tolerated treatment well   Behavior During Therapy [facility] for tasks assessed/performed      Past Medical History  Diagnosis Date  . Thyroid disease   . Chronic back pain   . Reflux   . Unspecified arthropathy, shoulder region   . Pure hypercholesterolemia     takes Pravastin daily  . Allergic rhinitis due to pollen   . Cervicalgia   . Diverticulosis of colon (without mention of hemorrhage)   . Morbid obesity   . Palpitations   . Peptic ulcer, unspecified site, unspecified as acute or chronic, without mention of hemorrhage, perforation, or obstruction   . Peripheral vertigo, unspecified     takes Meclizine prn  . Reflux esophagitis   . Spinal stenosis in cervical region   . 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 Personal history of tobacco use, presenting hazards to health   . Insomnia, unspecified   . Lumbago   . Right leg pain   . Shortness of breath   . H/O hiatal hernia   . HTN (hypertension)     takes Metoprolol and Lisinopril daily  . Hypertension   . Sleep apnea     slight but doesn't require a CPAP  . History of bronchitis     > 101yr ago  . Seasonal allergies     uses Flonase daily  . Headache(784.0)     denies migraines since the 80's but has occ and takes Butalbital prn  . Cervical  spondylosis without myelopathy     all over  . Degeneration of cervical intervertebral disc   . Primary localized osteoarthrosis, lower leg   . GERD (gastroesophageal reflux disease)     takes Omeprazole daily  . Constipation   . Diverticulosis   . History of bladder infections   . Urinary incontinence     occasionally  . Other postablative hypothyroidism     takes SYnthroid daily  . Insomnia     takes Ativan nigtly    Past Surgical History  Procedure Laterality Date  . Knee surgery      arthroscopy-- R  . Tonsillectomy    . Appendectomy    . Colonoscopy  Sept 2008    RMR: normal rectum, left-sided diverticula, repeat in 2018  . Abdominal hysterectomy    . Upper gastrointestinal endoscopy    . Breast lumpectomy      left   . Eye surgery      cataracts removed- bilateral, /w IOL  . Lumpectomy on pelvis    . Diagnostic laparoscopy    . Tubal ligation    . Esophagogastroduodenoscopy    . Anterior lat lumbar fusion  05/13/2012    Procedure: ANTERIOR LATERAL LUMBAR FUSION 2 LEVELS;  Surgeon: Faythe Ghee, MD;  Location: [place];  Service: Neurosurgery;  Laterality: Left;  Left Lumbar Three-four,Lumbar four-five Extreme Lumbar Interbody Fusion with Percutaneous Pedicle Screws  . Spinal fusion    . Knee arthroscopy with lateral menisectomy Right 10/06/2013    Procedure: KNEE ARTHROSCOPY WITH LATERAL AND MEDIAL MENISECTOMY;  Surgeon: Carole Civil, MD;  Location: [facility];  Service: Orthopedics;  Laterality: Right;  END @ 1234  . Foreign body removal Right 10/06/2013    Procedure: REMOVAL FOREIGN BODY EXTREMITY;  Surgeon: Carole Civil, MD;  Location: [facility];  Service: Orthopedics;  Laterality: Right;  . Injection knee Left 10/06/2013    Procedure: KNEE INJECTION;  Surgeon: Carole Civil, MD;  Location: [facility];  Service: Orthopedics;  Laterality: Left;  . Total knee arthroplasty Right 01/24/2014    Procedure: TOTAL KNEE ARTHROPLASTY;  Surgeon: Carole Civil,  MD;  Location: [facility];  Service: Orthopedics;  Laterality: Right;  . Total knee arthroplasty Left 08/08/2014    Procedure: TOTAL KNEE ARTHROPLASTY;  Surgeon: Carole Civil, MD;  Location: [facility];  Service: Orthopedics;  Laterality: Left;    There were no vitals taken for this visit.  Visit Diagnosis:  Primary osteoarthritis of left knee  Postoperative stiffness of total knee replacement, initial encounter  Left knee pain  Pain in joint, lower leg, left  Difficulty walking  Pain in joint, lower leg, right  Abnormality of gait  Left leg weakness      Subjective Assessment - 09/18/14 1435    Symptoms Pain minimal Lt knee pain scale 1.5/10 today.  Pt nervous about apt following this session for mamogram.   Currently in Pain? Yes   Pain Score 2    Pain Location Knee   Pain Orientation Left   Pain Descriptors / Indicators Sore;Aching                    [facility] Adult PT Treatment/Exercise - 09/18/14 1444    Exercises   Exercises Knee/Hip   Knee/Hip Exercises: Stretches   Active Hamstring Stretch 3 reps;30 seconds   Active Hamstring Stretch Limitations 2nd step   Knee: Self-Stretch Limitations 10 reps for flexibiltiy   Gastroc Stretch 3 reps;30 seconds   Gastroc Stretch Limitations Slantboard 3 way   Knee/Hip Exercises: Aerobic   Stationary Bike 6' seat 6->5 working on knee flexion   Knee/Hip Exercises: Standing   Heel Raises 10 reps   Heel Raises Limitations with squat to 14" box with red ball   Functional Squat 10 reps   Functional Squat Limitations see above   Stairs 5 reps reciprocal pattern 1 HR 7 in steps   SLS Lt 10", Rt 8" max of 5                  PT Short Term Goals - 09/18/14 1514    PT SHORT TERM GOAL #1   Title Patient will display increaseed knee flexion to 110 degrees to more easily squat to chair without weight shifting to Rt LE   PT SHORT TERM GOAL #2   Title Patient will be able to demosntrate 5x sit to stand in < 15  seconds indicating patient not at high risk for falls.    PT SHORT TERM GOAL #3   Title Patient will demonstrate increased ankle dorsiflexion of >10 degrees.    Status On-going   PT SHORT TERM GOAL #4   Title Patient will report walking >31minutes with tolerable pain.  Status On-going           PT Long Term Goals - 09/18/14 1515    PT LONG TERM GOAL #1   Title Patient will display increaseed knee flexion to 120 degrees to more easily squat to chair without weight shifting to Rt LE   Status Achieved   PT LONG TERM GOAL #2   Title Patient will display increased knee flexion and extension strength on Lt to 5/5 MMT to be able to go up and down stairs without UE support.    Status On-going   PT LONG TERM GOAL #3   Title Patient will report walking >6minutes to get groceries with pain <2/10   Status On-going               Plan - 09/18/14 1510    Clinical Impression Statement Began session with flexibilty to continue with good AROM and progressed to functional strengtheing.  Pt able to demonstrate appropraite reciprocal pattern on stair training with 1 handrail assistnace and verbal cueing to improve posture to reduce stress anterior knees and to control eccentric control while descending stairs.  Session short today due to mamogram apt at 3:15.  No reports of increased pain through session.     PT Next Visit Plan Continues with current PT POC to improve functional strengthening and increase activity tolerance with standing and walking towards goals, continue stretches to improve ankle dorsiflexion.  Begin nustep next session         Problem List Patient Active Problem List   Diagnosis Date Noted  . S/P total knee replacement 08/08/2014  . Difficulty in walking(719.7) 02/15/2014  . Postoperative stiffness of total knee replacement 02/15/2014  . Pain in joint, lower leg 02/15/2014  . Abnormality of gait 10/11/2013  . S/P arthroscopy of right knee 10/09/2013  . Radicular  pain of right lower extremity 07/25/2013  . Knee instability 07/25/2013  . Right knee pain 07/25/2013  . Chest pain 04/25/2013  . Patellar tendonitis 09/07/2012  . OA (osteoarthritis) of knee 02/09/2012  . GERD (gastroesophageal reflux disease) 12/01/2011  . Dysphagia 12/01/2011  . RUQ pain 12/01/2011  . Fatty liver 12/01/2011  . Hematochezia 12/01/2011  . INTERMITTENT VERTIGO 09/12/2010  . KNEE, ARTHRITIS, DEGEN./OSTEO 04/03/2009  . CHEST PAIN, RECURRENT 03/07/2009  . OTHER DYSPHAGIA 03/07/2009  . PALPITATIONS 02/06/2009  . LIVER FUNCTION TESTS, ABNORMAL, HX OF 01/01/2009  . MORBID OBESITY 12/31/2008  . PUD 12/31/2008  . BACK PAIN 12/24/2008  . ANSERINE BURSITIS 12/24/2008  . BENIGN POSITIONAL VERTIGO 07/16/2008  . H N P-LUMBAR 04/02/2008  . SPINAL STENOSIS, CERVICAL 02/15/2008  . SPINAL STENOSIS, LUMBAR 02/15/2008  . Patellar tendinitis 10/19/2007  . TEAR LATERAL MENISCUS 06/13/2007  . OBESITY NOS 10/29/2006  . ALLERGIC RHINITIS, SEASONAL 10/29/2006  . HYPOTHYROIDISM 09/07/2006  . HYPERLIPIDEMIA 09/07/2006  . HYPERTENSION 09/07/2006  . GERD 09/07/2006  . DIVERTICULOSIS, COLON 09/07/2006  . OVERACTIVE BLADDER 09/07/2006  . FIBROCYSTIC BREAST DISEASE 09/07/2006  . OSTEOARTHRITIS 09/07/2006   Ihor Austin, [place]  Aldona Lento 09/18/2014, 3:16 PM  [facility] [address] [place], [place], [address] Phone: [phone]   Fax:  [phone]

## 2014-09-20 ENCOUNTER — Ambulatory Visit (HOSPITAL_COMMUNITY): Payer: PRIVATE HEALTH INSURANCE

## 2014-09-21 DIAGNOSIS — C50911 Malignant neoplasm of unspecified site of right female breast: Secondary | ICD-10-CM

## 2014-09-21 HISTORY — DX: Malignant neoplasm of unspecified site of right female breast: C50.911

## 2014-09-24 ENCOUNTER — Ambulatory Visit (HOSPITAL_COMMUNITY)
Admission: RE | Admit: 2014-09-24 | Discharge: 2014-09-24 | Disposition: A | Discharge status: Home / Self Care | Payer: Medicare Other | Source: Ambulatory Visit | Attending: Family Medicine | Admitting: Family Medicine

## 2014-09-24 DIAGNOSIS — M1712 Unilateral primary osteoarthritis, left knee: Secondary | ICD-10-CM

## 2014-09-24 DIAGNOSIS — Z471 Aftercare following joint replacement surgery: Secondary | ICD-10-CM | POA: Diagnosis not present

## 2014-09-24 DIAGNOSIS — T8489XA Other specified complication of internal orthopedic prosthetic devices, implants and grafts, initial encounter: Secondary | ICD-10-CM

## 2014-09-24 DIAGNOSIS — M25662 Stiffness of left knee, not elsewhere classified: Secondary | ICD-10-CM | POA: Diagnosis not present

## 2014-09-24 DIAGNOSIS — Z96652 Presence of left artificial knee joint: Secondary | ICD-10-CM | POA: Insufficient documentation

## 2014-09-24 DIAGNOSIS — R262 Difficulty in walking, not elsewhere classified: Secondary | ICD-10-CM | POA: Insufficient documentation

## 2014-09-24 DIAGNOSIS — M25562 Pain in left knee: Secondary | ICD-10-CM | POA: Diagnosis not present

## 2014-09-24 DIAGNOSIS — M25561 Pain in right knee: Secondary | ICD-10-CM

## 2014-09-24 DIAGNOSIS — R29898 Other symptoms and signs involving the musculoskeletal system: Secondary | ICD-10-CM

## 2014-09-24 DIAGNOSIS — M25669 Stiffness of unspecified knee, not elsewhere classified: Secondary | ICD-10-CM

## 2014-09-24 DIAGNOSIS — R269 Unspecified abnormalities of gait and mobility: Secondary | ICD-10-CM

## 2014-09-24 DIAGNOSIS — Z96659 Presence of unspecified artificial knee joint: Secondary | ICD-10-CM

## 2014-09-24 NOTE — Therapy (Signed)
[place] [place], [place], [address] Phone: [phone]   Fax:  [phone]  Physical Therapy Treatment  Patient Details  Name: Sara [name] MRN: [id number] Date of Birth: [birth year]  Encounter Date: 09/24/2014      PT End of Session - 09/24/14 1527    Visit Number 10   Number of Visits 18   Date for PT Re-Evaluation 10/10/13   Authorization Type [employer]   Authorization - Visit Number 10   Authorization - Number of Visits 18   PT Start Time 4315   PT Stop Time 1522   PT Time Calculation (min) 39 min   Activity Tolerance Patient tolerated treatment well   Behavior During Therapy [facility] for tasks assessed/performed      Past Medical History  Diagnosis Date  . Thyroid disease   . Chronic back pain   . Reflux   . Unspecified arthropathy, shoulder region   . Pure hypercholesterolemia     takes Pravastin daily  . Allergic rhinitis due to pollen   . Cervicalgia   . Diverticulosis of colon (without mention of hemorrhage)   . Morbid obesity   . Palpitations   . Peptic ulcer, unspecified site, unspecified as acute or chronic, without mention of hemorrhage, perforation, or obstruction   . Peripheral vertigo, unspecified     takes Meclizine prn  . Reflux esophagitis   . Spinal stenosis in cervical region   . 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 Personal history of tobacco use, presenting hazards to health   . Insomnia, unspecified   . Lumbago   . Right leg pain   . Shortness of breath   . H/O hiatal hernia   . HTN (hypertension)     takes Metoprolol and Lisinopril daily  . Hypertension   . Sleep apnea     slight but doesn't require a CPAP  . History of bronchitis     > 35yr ago  . Seasonal allergies     uses Flonase daily  . Headache(784.0)     denies migraines since the 80's but has occ and takes Butalbital prn  . Cervical  spondylosis without myelopathy     all over  . Degeneration of cervical intervertebral disc   . Primary localized osteoarthrosis, lower leg   . GERD (gastroesophageal reflux disease)     takes Omeprazole daily  . Constipation   . Diverticulosis   . History of bladder infections   . Urinary incontinence     occasionally  . Other postablative hypothyroidism     takes SYnthroid daily  . Insomnia     takes Ativan nigtly    Past Surgical History  Procedure Laterality Date  . Knee surgery      arthroscopy-- R  . Tonsillectomy    . Appendectomy    . Colonoscopy  Sept 2008    RMR: normal rectum, left-sided diverticula, repeat in 2018  . Abdominal hysterectomy    . Upper gastrointestinal endoscopy    . Breast lumpectomy      left   . Eye surgery      cataracts removed- bilateral, /w IOL  . Lumpectomy on pelvis    . Diagnostic laparoscopy    . Tubal ligation    . Esophagogastroduodenoscopy    . Anterior lat lumbar fusion  05/13/2012    Procedure: ANTERIOR LATERAL LUMBAR FUSION 2 LEVELS;  Surgeon: Faythe Ghee, MD;  Location: [facility];  Service: Neurosurgery;  Laterality: Left;  Left Lumbar Three-four,Lumbar four-five Extreme Lumbar Interbody Fusion with Percutaneous Pedicle Screws  . Spinal fusion    . Knee arthroscopy with lateral menisectomy Right 10/06/2013    Procedure: KNEE ARTHROSCOPY WITH LATERAL AND MEDIAL MENISECTOMY;  Surgeon: Carole Civil, MD;  Location: [facility];  Service: Orthopedics;  Laterality: Right;  END @ 1234  . Foreign body removal Right 10/06/2013    Procedure: REMOVAL FOREIGN BODY EXTREMITY;  Surgeon: Carole Civil, MD;  Location: [facility];  Service: Orthopedics;  Laterality: Right;  . Injection knee Left 10/06/2013    Procedure: KNEE INJECTION;  Surgeon: Carole Civil, MD;  Location: [facility];  Service: Orthopedics;  Laterality: Left;  . Total knee arthroplasty Right 01/24/2014    Procedure: TOTAL KNEE ARTHROPLASTY;  Surgeon: Carole Civil,  MD;  Location: [facility];  Service: Orthopedics;  Laterality: Right;  . Total knee arthroplasty Left 08/08/2014    Procedure: TOTAL KNEE ARTHROPLASTY;  Surgeon: Carole Civil, MD;  Location: [facility];  Service: Orthopedics;  Laterality: Left;    There were no vitals taken for this visit.  Visit Diagnosis:  Primary osteoarthritis of left knee  Postoperative stiffness of total knee replacement, initial encounter  Left knee pain  Pain in joint, lower leg, left  Difficulty walking  Pain in joint, lower leg, right  Abnormality of gait  Left leg weakness      Subjective Assessment - 09/24/14 1446    Symptoms Pt reprots a mild increase in pain today to 4/10.  Pt reports she had to go up/down steps over the past couple days, along with an increase in achiness from the cold weather.    Currently in Pain? Yes   Pain Score 4    Pain Location Knee   Pain Orientation Left   Pain Descriptors / Indicators Aching          [facility] PT Assessment - 09/24/14 1507    Assessment   Medical Diagnosis Lt TKA, lt knee stiffness, lt knee pain.    Onset Date 08/08/14   Next MD Visit Arther Abbott, 10/18/14                  [facility] PT Treatment/Exercise - 09/24/14 1446    Exercises   Exercises Knee/Hip   Knee/Hip Exercises: Stretches   Active Hamstring Stretch 3 reps;20 seconds   Active Hamstring Stretch Limitations 3 Way, 14" Box    Quad Stretch 3 reps;30 seconds   Quad Stretch Limitations prone with rope   Knee: Self-Stretch Limitations 10 reps for flexibiltiy   Gastroc Stretch 3 reps;30 seconds   Gastroc Stretch Limitations Slantboard 3 way   Knee/Hip Exercises: Aerobic   Stationary Bike 6' seat 6->5 working on knee flexion   Knee/Hip Exercises: Standing   Heel Raises Other (comment)   Heel Raises Limitations x12, with squat to 14" box with red ball   Lateral Step Up 15 reps;Hand Hold: 1;Step Height: 6";Left   Forward Step Up 15 reps;Hand Hold: 1;Step Height: 6";Left    Step Down Left;15 reps;Hand Hold: 1;Step Height: 4"   Functional Squat Other (comment)   Functional Squat Limitations x12, see above   Other Standing Knee Exercises Side Stepping 1 RTgreen tband                  PT Short Term Goals - 09/18/14 1514    PT SHORT TERM  GOAL #1   Title Patient will display increaseed knee flexion to 110 degrees to more easily squat to chair without weight shifting to Rt LE   PT SHORT TERM GOAL #2   Title Patient will be able to demosntrate 5x sit to stand in < 15 seconds indicating patient not at high risk for falls.    PT SHORT TERM GOAL #3   Title Patient will demonstrate increased ankle dorsiflexion of >10 degrees.    Status On-going   PT SHORT TERM GOAL #4   Title Patient will report walking >38minutes with tolerable pain.    Status On-going           PT Long Term Goals - 09/18/14 1515    PT LONG TERM GOAL #1   Title Patient will display increaseed knee flexion to 120 degrees to more easily squat to chair without weight shifting to Rt LE   Status Achieved   PT LONG TERM GOAL #2   Title Patient will display increased knee flexion and extension strength on Lt to 5/5 MMT to be able to go up and down stairs without UE support.    Status On-going   PT LONG TERM GOAL #3   Title Patient will report walking >58minutes to get groceries with pain <2/10   Status On-going               Plan - 09/24/14 1528    Clinical Impression Statement Pt reports mild increase in pain today, adn requested starting treatment session with bike to loosen knee up.  Pt was able to progress to seat 5 on bike, like previous treatment sessions, though did report it was "more difficult" than normal secondary to stiffness in knee.   Continued strengthening exercises, increasing reps of exercises to tolerance.  No increases in pain reported by end of session, though soreness was reported medial > lateral knee by end of session.    Pt will benefit from skilled  therapeutic intervention in order to improve on the following deficits Abnormal gait;Decreased strength;Pain;Difficulty walking;Decreased mobility;Decreased activity tolerance;Decreased balance;Decreased range of motion;Increased fascial restricitons;Improper body mechanics;Impaired flexibility;Decreased endurance   Rehab Potential Fair   PT Frequency 3x / week   PT Duration 6 weeks   PT Treatment/Interventions Therapeutic exercise;Balance training;Patient/family education;Gait training;Manual techniques;Stair training;Passive range of motion   PT Next Visit Plan Begin NuStep next visit.         Problem List Patient Active Problem List   Diagnosis Date Noted  . S/P total knee replacement 08/08/2014  . Difficulty in walking(719.7) 02/15/2014  . Postoperative stiffness of total knee replacement 02/15/2014  . Pain in joint, lower leg 02/15/2014  . Abnormality of gait 10/11/2013  . S/P arthroscopy of right knee 10/09/2013  . Radicular pain of right lower extremity 07/25/2013  . Knee instability 07/25/2013  . Right knee pain 07/25/2013  . Chest pain 04/25/2013  . Patellar tendonitis 09/07/2012  . OA (osteoarthritis) of knee 02/09/2012  . GERD (gastroesophageal reflux disease) 12/01/2011  . Dysphagia 12/01/2011  . RUQ pain 12/01/2011  . Fatty liver 12/01/2011  . Hematochezia 12/01/2011  . INTERMITTENT VERTIGO 09/12/2010  . KNEE, ARTHRITIS, DEGEN./OSTEO 04/03/2009  . CHEST PAIN, RECURRENT 03/07/2009  . OTHER DYSPHAGIA 03/07/2009  . PALPITATIONS 02/06/2009  . LIVER FUNCTION TESTS, ABNORMAL, HX OF 01/01/2009  . MORBID OBESITY 12/31/2008  . PUD 12/31/2008  . BACK PAIN 12/24/2008  . ANSERINE BURSITIS 12/24/2008  . BENIGN POSITIONAL VERTIGO 07/16/2008  . H N P-LUMBAR 04/02/2008  .  SPINAL STENOSIS, CERVICAL 02/15/2008  . SPINAL STENOSIS, LUMBAR 02/15/2008  . Patellar tendinitis 10/19/2007  . TEAR LATERAL MENISCUS 06/13/2007  . OBESITY NOS 10/29/2006  . ALLERGIC RHINITIS, SEASONAL  10/29/2006  . HYPOTHYROIDISM 09/07/2006  . HYPERLIPIDEMIA 09/07/2006  . HYPERTENSION 09/07/2006  . GERD 09/07/2006  . DIVERTICULOSIS, COLON 09/07/2006  . OVERACTIVE BLADDER 09/07/2006  . FIBROCYSTIC BREAST DISEASE 09/07/2006  . OSTEOARTHRITIS 09/07/2006   Lonna Cobb, DPT [phone]  [facility] [address] [place], [place], [address] Phone: [phone]   Fax:  [phone]

## 2014-09-25 ENCOUNTER — Encounter (HOSPITAL_COMMUNITY): Payer: Self-pay

## 2014-09-25 ENCOUNTER — Ambulatory Visit (HOSPITAL_COMMUNITY)
Admission: RE | Admit: 2014-09-25 | Discharge: 2014-09-25 | Disposition: A | Discharge status: Home / Self Care | Payer: Medicare Other | Source: Ambulatory Visit | Attending: Family Medicine | Admitting: Family Medicine

## 2014-09-25 ENCOUNTER — Other Ambulatory Visit (HOSPITAL_COMMUNITY): Payer: Self-pay | Admitting: Family Medicine

## 2014-09-25 ENCOUNTER — Other Ambulatory Visit (HOSPITAL_COMMUNITY): Payer: PRIVATE HEALTH INSURANCE

## 2014-09-25 DIAGNOSIS — R928 Other abnormal and inconclusive findings on diagnostic imaging of breast: Secondary | ICD-10-CM

## 2014-09-25 DIAGNOSIS — N6310 Unspecified lump in the right breast, unspecified quadrant: Secondary | ICD-10-CM

## 2014-09-25 DIAGNOSIS — N63 Unspecified lump in breast: Secondary | ICD-10-CM | POA: Diagnosis present

## 2014-09-25 MED ORDER — LIDOCAINE HCL (PF) 2 % IJ SOLN
INTRAMUSCULAR | Status: AC
  Filled 2014-09-25: qty 10

## 2014-09-25 NOTE — Discharge Instructions (Signed)
Breast Biopsy  Care After Refer to this sheet in the next few weeks. These instructions provide you with information on caring for yourself after your procedure. Your caregiver may also give you more specific instructions. Your treatment has been planned according to current medical practices, but problems sometimes occur. Call your caregiver if you have any problems or questions after your procedure. HOME CARE INSTRUCTIONS   Only take over-the-counter or prescription medicines for pain, discomfort, or fever as directed by your caregiver.  Do not take aspirin. It can cause bleeding.  Keep stitches dry when bathing.  Protect the biopsy area. Do not let the area get bumped.  Avoid activities that may pull the incision site open until approved by your caregiver. This can include stretching, reaching, exercise, sports, or lifting over 3 pounds.  Resume your usual diet.  Wear a good support bra for as long as directed by your caregiver.  Change any bandages (dressings) as directed by your caregiver.  Do not drink alcohol while taking pain medicine.  Keep all your follow-up appointments with your caregiver. Ask when your test results will be ready. Make sure you get your test results. SEEK MEDICAL CARE IF:   You have redness, swelling, or increasing pain in the biopsy site.  You have a bad smell coming from the biopsy site or dressing.  Your biopsy site breaks open after the stitches (sutures), staples, or skin adhesive strips have been removed.  You have a rash.  You need stronger medicine. SEEK IMMEDIATE MEDICAL CARE IF:   You have a fever.  You have increased bleeding (more than a small spot) from the biopsy site.  You have difficulty breathing.  You have pus coming from the biopsy site. MAKE SURE YOU:  Understand these instructions.  Will watch your condition.  Will get help right away if you are not doing well or get worse. Document Released: 03/27/2005 Document  Revised: 11/30/2011 Document Reviewed: 10/08/2011 [facility] Patient Information 2015 [place], [place]. This information is not intended to replace advice given to you by your health care provider. Make sure you discuss any questions you have with your health care provider.  Breast Biopsy A breast biopsy is a test during which a sample of tissue is taken from your breast. The breast tissue is looked at under a microscope for cancer cells.  BEFORE THE PROCEDURE  Make plans to have someone drive you home after the test.  Do not smoke for 2 weeks before the test. Stop smoking, if you smoke.  Do not drink alcohol for 24 hours before the test.  Wear a good support bra to the test. PROCEDURE  You may be given one of the following:  A medicine to numb the breast area (local anesthetic).  A medicine to make you fall asleep (general anesthetic). There are different types of breast biopsies. They include:  Fine-needle aspiration.  A needle is put into the breast lump.  The needle takes out fluid and cells from the lump.  Ultrasound imaging may be used to help find the lump and to put the needle in the right spot.  Core-needle biopsy.  A needle is put into the breast lump.  The needle is put in your breast 3-6 times.  The needle removes breast tissue.  An ultrasound image or X-ray is often used to find the right spot to put in the needle.  Stereotactic biopsy.  X-rays and a computer are used to study X-ray pictures of the breast lump.  The computer finds where the needle needs to be put into the breast.  Tissue samples are taken out.  Vacuum-assisted biopsy.  A small cut (incision) is made in your breast.  A biopsy device is put through the cut and into the breast tissue.  The biopsy device draws abnormal breast tissue into the biopsy device.  A large tissue sample is often removed.  No stitches are needed.  Ultrasound-guided core-needle biopsy.  Ultrasound imaging  helps guide the needle into the area of the breast that is not normal.  A cut is made in the breast. The needle is put into the breast lump.  Tissue samples are taken out.  Open biopsy.  A large cut is made in the breast.  Your doctor will try to remove the whole breast lump or as much as possible. All tissue, fluid, or cell samples are looked at under a microscope.  AFTER THE PROCEDURE  You will be taken to an area to recover. You will be able to go home once you are doing well and are without problems.  You may have bruising on your breast. This is normal.  A pressure bandage (dressing) may be put on your breast for 24-48 hours. This type of bandage is wrapped tightly around your chest. It helps stop fluid from building up underneath tissues. Document Released: 11/30/2011 Document Revised: 01/22/2014 Document Reviewed: 11/30/2011 [facility] Patient Information 2015 [place], [place]. This information is not intended to replace advice given to you by your health care provider. Make sure you discuss any questions you have with your health care provider.

## 2014-09-26 ENCOUNTER — Ambulatory Visit (HOSPITAL_COMMUNITY)
Admission: RE | Admit: 2014-09-26 | Discharge: 2014-09-26 | Disposition: A | Discharge status: Home / Self Care | Payer: Medicare Other | Source: Ambulatory Visit | Attending: Family Medicine | Admitting: Family Medicine

## 2014-09-26 ENCOUNTER — Encounter (HOSPITAL_COMMUNITY): Payer: Self-pay

## 2014-09-26 DIAGNOSIS — M25562 Pain in left knee: Secondary | ICD-10-CM | POA: Diagnosis not present

## 2014-09-26 DIAGNOSIS — M25561 Pain in right knee: Secondary | ICD-10-CM

## 2014-09-26 DIAGNOSIS — Z96652 Presence of left artificial knee joint: Secondary | ICD-10-CM | POA: Diagnosis not present

## 2014-09-26 DIAGNOSIS — R29898 Other symptoms and signs involving the musculoskeletal system: Secondary | ICD-10-CM

## 2014-09-26 DIAGNOSIS — R269 Unspecified abnormalities of gait and mobility: Secondary | ICD-10-CM

## 2014-09-26 DIAGNOSIS — R262 Difficulty in walking, not elsewhere classified: Secondary | ICD-10-CM | POA: Diagnosis not present

## 2014-09-26 DIAGNOSIS — M1712 Unilateral primary osteoarthritis, left knee: Secondary | ICD-10-CM

## 2014-09-26 DIAGNOSIS — Z471 Aftercare following joint replacement surgery: Secondary | ICD-10-CM | POA: Diagnosis not present

## 2014-09-26 DIAGNOSIS — M25662 Stiffness of left knee, not elsewhere classified: Secondary | ICD-10-CM | POA: Diagnosis not present

## 2014-09-26 DIAGNOSIS — Z96659 Presence of unspecified artificial knee joint: Secondary | ICD-10-CM

## 2014-09-26 DIAGNOSIS — T8489XA Other specified complication of internal orthopedic prosthetic devices, implants and grafts, initial encounter: Secondary | ICD-10-CM

## 2014-09-26 DIAGNOSIS — M25669 Stiffness of unspecified knee, not elsewhere classified: Secondary | ICD-10-CM

## 2014-09-26 NOTE — Therapy (Signed)
[place], [place], [address] Phone: [phone]   Fax:  [phone]  Physical Therapy Treatment  Patient Details  Name: Sara [name] MRN: [id number] Date of Birth: Apr 25, 1946 Referring Provider:  [name], MD  Encounter Date: 09/26/2014      PT End of Session - 09/26/14 1515    Visit Number 11   Number of Visits 18   Date for PT Re-Evaluation 10/10/13   Authorization Type [employer]   Authorization Time Period Gcode complete 8th visit   Authorization - Visit Number 11   Authorization - Number of Visits 18   PT Start Time 1435   PT Stop Time 1515   PT Time Calculation (min) 40 min   Activity Tolerance Patient tolerated treatment well   Behavior During Therapy [facility] for tasks assessed/performed      Past Medical History  Diagnosis Date  . Thyroid disease   . Chronic back pain   . Reflux   . Unspecified arthropathy, shoulder region   . Pure hypercholesterolemia     takes Pravastin daily  . Allergic rhinitis due to pollen   . Cervicalgia   . Diverticulosis of colon (without mention of hemorrhage)   . Morbid obesity   . Palpitations   . Peptic ulcer, unspecified site, unspecified as acute or chronic, without mention of hemorrhage, perforation, or obstruction   . Peripheral vertigo, unspecified     takes Meclizine prn  . Reflux esophagitis   . Spinal stenosis in cervical region   . 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 Personal history of tobacco use, presenting hazards to health   . Insomnia, unspecified   . Lumbago   . Right leg pain   . Shortness of breath   . H/O hiatal hernia   . HTN (hypertension)     takes Metoprolol and Lisinopril daily  . Hypertension   . Sleep apnea     slight but doesn't require a CPAP  . History of bronchitis     > 55yr ago  . Seasonal allergies     uses Flonase daily  . Headache(784.0)      denies migraines since the 80's but has occ and takes Butalbital prn  . Cervical spondylosis without myelopathy     all over  . Degeneration of cervical intervertebral disc   . Primary localized osteoarthrosis, lower leg   . GERD (gastroesophageal reflux disease)     takes Omeprazole daily  . Constipation   . Diverticulosis   . History of bladder infections   . Urinary incontinence     occasionally  . Other postablative hypothyroidism     takes SYnthroid daily  . Insomnia     takes Ativan nigtly    Past Surgical History  Procedure Laterality Date  . Knee surgery      arthroscopy-- R  . Tonsillectomy    . Appendectomy    . Colonoscopy  Sept 2008    RMR: normal rectum, left-sided diverticula, repeat in 2018  . Abdominal hysterectomy    . Upper gastrointestinal endoscopy    . Breast lumpectomy      left   . Eye surgery      cataracts removed- bilateral, /w IOL  . Lumpectomy on pelvis    . Diagnostic laparoscopy    . Tubal ligation    . Esophagogastroduodenoscopy    . Anterior lat  lumbar fusion  05/13/2012    Procedure: ANTERIOR LATERAL LUMBAR FUSION 2 LEVELS;  Surgeon: Faythe Ghee, MD;  Location: [facility];  Service: Neurosurgery;  Laterality: Left;  Left Lumbar Three-four,Lumbar four-five Extreme Lumbar Interbody Fusion with Percutaneous Pedicle Screws  . Spinal fusion    . Knee arthroscopy with lateral menisectomy Right 10/06/2013    Procedure: KNEE ARTHROSCOPY WITH LATERAL AND MEDIAL MENISECTOMY;  Surgeon: Carole Civil, MD;  [facility];  Service: Orthopedics;  Laterality: Right;  END @ 1234  . Foreign body removal Right 10/06/2013    Procedure: REMOVAL FOREIGN BODY EXTREMITY;  Surgeon: Carole Civil, MD;  Location: [facility];  Service: Orthopedics;  Laterality: Right;  . Injection knee Left 10/06/2013    Procedure: KNEE INJECTION;  Surgeon: Carole Civil, MD;  Location: [facility];  Service: Orthopedics;  Laterality: Left;  . Total knee arthroplasty  Right 01/24/2014    Procedure: TOTAL KNEE ARTHROPLASTY;  Surgeon: Carole Civil, MD;  Location: [facility];  Service: Orthopedics;  Laterality: Right;  . Total knee arthroplasty Left 08/08/2014    Procedure: TOTAL KNEE ARTHROPLASTY;  Surgeon: Carole Civil, MD;  Location: [facility];  Service: Orthopedics;  Laterality: Left;    There were no vitals taken for this visit.  Visit Diagnosis:  Primary osteoarthritis of left knee  Postoperative stiffness of total knee replacement, initial encounter  Left knee pain  Pain in joint, lower leg, left  Difficulty walking  Pain in joint, lower leg, right  Abnormality of gait  Left leg weakness  Right knee pain      Subjective Assessment - 09/26/14 1441    Symptoms Pt stated minimal pain on Lt knee 1/10.  Pt worried about biospy, supposed to get results in a couple days.     Currently in Pain? Yes   Pain Score 1    Pain Location Knee   Pain Orientation Left          [facility] PT Assessment - 09/26/14 1453    Assessment   Medical Diagnosis Lt TKA, lt knee stiffness, lt knee pain.    Onset Date 08/08/14   Next MD Visit Arther Abbott, 10/18/14   Prior Therapy home health 3 visits                  [facility] Adult PT Treatment/Exercise - 09/26/14 1453    Exercises   Exercises Knee/Hip   Knee/Hip Exercises: Stretches   Active Hamstring Stretch 3 reps;20 seconds   Active Hamstring Stretch Limitations 3 Way, 14" Box    Knee: Self-Stretch Limitations 10 reps for flexibiltiy   Gastroc Stretch 3 reps;30 seconds   Gastroc Stretch Limitations Slantboard 3 way   Knee/Hip Exercises: Aerobic   Stationary Bike NuStep x 8 min hill level 3 SPM Average 98   Knee/Hip Exercises: Standing   Heel Raises Other (comment);15 reps   Heel Raises Limitations proper lifting yellow ball with heel raise following   Lateral Step Up 15 reps;Hand Hold: 1;Step Height: 6";Left   Forward Step Up 15 reps;Hand Hold: 1;Step Height: 6";Left   Step Down  Left;Hand Hold: 1;10 reps;Step Height: 6"   Functional Squat 15 reps   Functional Squat Limitations proper lifting yellow ball with heel raises    SLS Lt13", Rt 10" max of 3                  PT Short Term Goals - 09/26/14 1456    PT SHORT TERM GOAL #  1   Title Patient will display increaseed knee flexion to 110 degrees to more easily squat to chair without weight shifting to Rt LE   Status Achieved   PT SHORT TERM GOAL #2   Title Patient will be able to demosntrate 5x sit to stand in < 15 seconds indicating patient not at high risk for falls.    PT SHORT TERM GOAL #3   Title Patient will demonstrate increased ankle dorsiflexion of >10 degrees.    Status On-going   PT SHORT TERM GOAL #4   Title Patient will report walking >43minutes with tolerable pain.    Status On-going           PT Long Term Goals - 09/26/14 1457    PT LONG TERM GOAL #1   Title Patient will display increaseed knee flexion to 120 degrees to more easily squat to chair without weight shifting to Rt LE   Status Achieved   PT LONG TERM GOAL #2   Title Patient will display increased knee flexion and extension strength on Lt to 5/5 MMT to be able to go up and down stairs without UE support.    Status On-going   PT LONG TERM GOAL #3   Title Patient will report walking >82minutes to get groceries with pain <2/10   Status On-going               Plan - 09/26/14 1516    Clinical Impression Statement Begam Nustep for LE strengthening and activity tolerance, pt able to achieve average 98 SPM with hill level 3.  Progress LE functional strengthening with increased step height with step down for eccentric quad control, noted visible quad fatigue with activitiy.  Pt stated slight increase in pain, encouraged to apply ice at home for pain and edema control.     PT Next Visit Plan Continue wiht current PT POC to improve functional strengthening.  Resume glut med strengthening acitviites with side  stepping/sumowalking and balance activiteis.        Problem List Patient Active Problem List   Diagnosis Date Noted  . S/P total knee replacement 08/08/2014  . Difficulty in walking(719.7) 02/15/2014  . Postoperative stiffness of total knee replacement 02/15/2014  . Pain in joint, lower leg 02/15/2014  . Abnormality of gait 10/11/2013  . S/P arthroscopy of right knee 10/09/2013  . Radicular pain of right lower extremity 07/25/2013  . Knee instability 07/25/2013  . Right knee pain 07/25/2013  . Chest pain 04/25/2013  . Patellar tendonitis 09/07/2012  . OA (osteoarthritis) of knee 02/09/2012  . GERD (gastroesophageal reflux disease) 12/01/2011  . Dysphagia 12/01/2011  . RUQ pain 12/01/2011  . Fatty liver 12/01/2011  . Hematochezia 12/01/2011  . INTERMITTENT VERTIGO 09/12/2010  . KNEE, ARTHRITIS, DEGEN./OSTEO 04/03/2009  . CHEST PAIN, RECURRENT 03/07/2009  . OTHER DYSPHAGIA 03/07/2009  . PALPITATIONS 02/06/2009  . LIVER FUNCTION TESTS, ABNORMAL, HX OF 01/01/2009  . MORBID OBESITY 12/31/2008  . PUD 12/31/2008  . BACK PAIN 12/24/2008  . ANSERINE BURSITIS 12/24/2008  . BENIGN POSITIONAL VERTIGO 07/16/2008  . H N P-LUMBAR 04/02/2008  . SPINAL STENOSIS, CERVICAL 02/15/2008  . SPINAL STENOSIS, LUMBAR 02/15/2008  . Patellar tendinitis 10/19/2007  . TEAR LATERAL MENISCUS 06/13/2007  . OBESITY NOS 10/29/2006  . ALLERGIC RHINITIS, SEASONAL 10/29/2006  . HYPOTHYROIDISM 09/07/2006  . HYPERLIPIDEMIA 09/07/2006  . HYPERTENSION 09/07/2006  . GERD 09/07/2006  . DIVERTICULOSIS, COLON 09/07/2006  . OVERACTIVE BLADDER 09/07/2006  . FIBROCYSTIC BREAST DISEASE 09/07/2006  . OSTEOARTHRITIS  09/07/2006   Ihor Austin, [place]   Aldona Lento 09/26/2014, 3:23 PM  [facility] [address] [place], [place], [address] Phone: [phone]   Fax:  [phone]

## 2014-09-27 ENCOUNTER — Other Ambulatory Visit: Payer: Self-pay | Admitting: Family Medicine

## 2014-09-27 DIAGNOSIS — C50911 Malignant neoplasm of unspecified site of right female breast: Secondary | ICD-10-CM

## 2014-09-28 ENCOUNTER — Ambulatory Visit (HOSPITAL_COMMUNITY)
Admission: RE | Admit: 2014-09-28 | Discharge: 2014-09-28 | Disposition: A | Discharge status: Home / Self Care | Payer: Medicare Other | Source: Ambulatory Visit | Attending: Family Medicine | Admitting: Family Medicine

## 2014-09-28 DIAGNOSIS — T8489XA Other specified complication of internal orthopedic prosthetic devices, implants and grafts, initial encounter: Secondary | ICD-10-CM

## 2014-09-28 DIAGNOSIS — M25561 Pain in right knee: Secondary | ICD-10-CM

## 2014-09-28 DIAGNOSIS — R262 Difficulty in walking, not elsewhere classified: Secondary | ICD-10-CM | POA: Diagnosis not present

## 2014-09-28 DIAGNOSIS — M25669 Stiffness of unspecified knee, not elsewhere classified: Secondary | ICD-10-CM

## 2014-09-28 DIAGNOSIS — M25562 Pain in left knee: Secondary | ICD-10-CM | POA: Diagnosis not present

## 2014-09-28 DIAGNOSIS — Z471 Aftercare following joint replacement surgery: Secondary | ICD-10-CM | POA: Diagnosis not present

## 2014-09-28 DIAGNOSIS — M25662 Stiffness of left knee, not elsewhere classified: Secondary | ICD-10-CM | POA: Diagnosis not present

## 2014-09-28 DIAGNOSIS — Z96652 Presence of left artificial knee joint: Secondary | ICD-10-CM | POA: Diagnosis not present

## 2014-09-28 DIAGNOSIS — R269 Unspecified abnormalities of gait and mobility: Secondary | ICD-10-CM

## 2014-09-28 DIAGNOSIS — Z96659 Presence of unspecified artificial knee joint: Secondary | ICD-10-CM

## 2014-09-28 DIAGNOSIS — M1712 Unilateral primary osteoarthritis, left knee: Secondary | ICD-10-CM

## 2014-09-28 DIAGNOSIS — R29898 Other symptoms and signs involving the musculoskeletal system: Secondary | ICD-10-CM

## 2014-09-28 NOTE — Therapy (Signed)
[place], [place], [address] Phone: [phone]   Fax:  [phone]  Physical Therapy Treatment  Patient Details  Name: Sara [name] MRN: [id number] Date of Birth: June 24, 1946 Referring Provider:  [name], MD  Encounter Date: 09/28/2014      PT End of Session - 09/28/14 1516    Visit Number 12   Number of Visits 18   Date for PT Re-Evaluation 10/10/13   Authorization Type [employer] Medicare   Authorization Time Period Gcode complete 8th visit   Authorization - Visit Number 12   Authorization - Number of Visits 18   PT Start Time 1829   PT Stop Time 1525   PT Time Calculation (min) 41 min   Activity Tolerance Patient tolerated treatment well   Behavior During Therapy [facility] for tasks assessed/performed      Past Medical History  Diagnosis Date  . Thyroid disease   . Chronic back pain   . Reflux   . Unspecified arthropathy, shoulder region   . Pure hypercholesterolemia     takes Pravastin daily  . Allergic rhinitis due to pollen   . Cervicalgia   . Diverticulosis of colon (without mention of hemorrhage)   . Morbid obesity   . Palpitations   . Peptic ulcer, unspecified site, unspecified as acute or chronic, without mention of hemorrhage, perforation, or obstruction   . Peripheral vertigo, unspecified     takes Meclizine prn  . Reflux esophagitis   . Spinal stenosis in cervical region   . 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 Personal history of tobacco use, presenting hazards to health   . Insomnia, unspecified   . Lumbago   . Right leg pain   . Shortness of breath   . H/O hiatal hernia   . HTN (hypertension)     takes Metoprolol and Lisinopril daily  . Hypertension   . Sleep apnea     slight but doesn't require a CPAP  . History of bronchitis     > 30yr ago  . Seasonal allergies     uses Flonase daily  . Headache(784.0)    denies migraines since the 80's but has occ and takes Butalbital prn  . Cervical spondylosis without myelopathy     all over  . Degeneration of cervical intervertebral disc   . Primary localized osteoarthrosis, lower leg   . GERD (gastroesophageal reflux disease)     takes Omeprazole daily  . Constipation   . Diverticulosis   . History of bladder infections   . Urinary incontinence     occasionally  . Other postablative hypothyroidism     takes SYnthroid daily  . Insomnia     takes Ativan nigtly    Past Surgical History  Procedure Laterality Date  . Knee surgery      arthroscopy-- R  . Tonsillectomy    . Appendectomy    . Colonoscopy  Sept 2008    RMR: normal rectum, left-sided diverticula, repeat in 2018  . Abdominal hysterectomy    . Upper gastrointestinal endoscopy    . Breast lumpectomy      left   . Eye surgery      cataracts removed- bilateral, /w IOL  . Lumpectomy on pelvis    . Diagnostic laparoscopy    . Tubal ligation    . Esophagogastroduodenoscopy    . Anterior lat lumbar fusion  05/13/2012    Procedure: ANTERIOR LATERAL LUMBAR FUSION 2 LEVELS;  Surgeon: Faythe Ghee, MD;  Location: [facility];  Service: Neurosurgery;  Laterality: Left;  Left Lumbar Three-four,Lumbar four-five Extreme Lumbar Interbody Fusion with Percutaneous Pedicle Screws  . Spinal fusion    . Knee arthroscopy with lateral menisectomy Right 10/06/2013    Procedure: KNEE ARTHROSCOPY WITH LATERAL AND MEDIAL MENISECTOMY;  Surgeon: Carole Civil, MD;  [facility]: [facility];  Service: Orthopedics;  Laterality: Right;  END @ 1234  . Foreign body removal Right 10/06/2013    Procedure: REMOVAL FOREIGN BODY EXTREMITY;  Surgeon: Carole Civil, MD;  Location: [facility];  Service: Orthopedics;  Laterality: Right;  . Injection knee Left 10/06/2013    Procedure: KNEE INJECTION;  Surgeon: Carole Civil, MD;  Location: [facility];  Service: Orthopedics;  Laterality: Left;  . Total knee arthroplasty  Right 01/24/2014    Procedure: TOTAL KNEE ARTHROPLASTY;  Surgeon: Carole Civil, MD;  Location: [facility];  Service: Orthopedics;  Laterality: Right;  . Total knee arthroplasty Left 08/08/2014    Procedure: TOTAL KNEE ARTHROPLASTY;  Surgeon: Carole Civil, MD;  Location: [facility];  Service: Orthopedics;  Laterality: Left;    There were no vitals taken for this visit.  Visit Diagnosis:  Primary osteoarthritis of left knee  Postoperative stiffness of total knee replacement, initial encounter  Left knee pain  Pain in joint, lower leg, left  Difficulty walking  Pain in joint, lower leg, right  Abnormality of gait  Left leg weakness  Right knee pain      Subjective Assessment - 09/28/14 1451    Symptoms Pt stated she was scared, just back from MD in [place] and diagnosed with breast cancer today, goes back to [place] next week to discuss treatment.  Pain scale 1 1/2 Lt knee today.   Currently in Pain? Yes   Pain Score 2    Pain Location Knee   Pain Orientation Left   Pain Descriptors / Indicators Aching;Sore                    [facility] Adult PT Treatment/Exercise - 09/28/14 1504    Exercises   Exercises Knee/Hip   Knee/Hip Exercises: Stretches   Active Hamstring Stretch 3 reps;30 seconds   Active Hamstring Stretch Limitations 3 Way, 14" Box    Knee: Self-Stretch Limitations 10 reps for flexibiltiy   Gastroc Stretch 3 reps;30 seconds   Gastroc Stretch Limitations Slantboard 3 way   Knee/Hip Exercises: Aerobic   Stationary Bike NuStep x 10 min hill level 4 SPM Average 98   Knee/Hip Exercises: Standing   Lateral Step Up 15 reps;Hand Hold: 1;Step Height: 6";Left   Forward Step Up 15 reps;Hand Hold: 1;Step Height: 6";Left   Step Down Left;10 reps;Hand Hold: 2;Step Height: 2"   Step Down Limitations single leg reach common from 2in step   SLS Lt 16" max of 5   Other Standing Knee Exercises Sumo walk 2RT blue tband                  PT Short Term Goals -  09/28/14 1825    PT SHORT TERM GOAL #1   Title Patient will display increaseed knee flexion to 110 degrees to more easily squat to chair without weight shifting to Rt LE   Status Achieved   PT SHORT TERM GOAL #2   Title Patient will be able to demosntrate 5x sit to stand in < 15 seconds  indicating patient not at high risk for falls.    Status Achieved   PT SHORT TERM GOAL #3   Title Patient will demonstrate increased ankle dorsiflexion of >10 degrees.    Status On-going   PT SHORT TERM GOAL #4   Title Patient will report walking >83minutes with tolerable pain.    Status On-going           PT Long Term Goals - 09/28/14 1825    PT LONG TERM GOAL #1   Title Patient will display increaseed knee flexion to 120 degrees to more easily squat to chair without weight shifting to Rt LE   Status Achieved   PT LONG TERM GOAL #2   Title Patient will display increased knee flexion and extension strength on Lt to 5/5 MMT to be able to go up and down stairs without UE support.    Status On-going   PT LONG TERM GOAL #3   Title Patient will report walking >75minutes to get groceries with pain <2/10   Status On-going               Plan - 09/28/14 1822    Clinical Impression Statement Progressed to single leg reaching to improve eccentric quad control and increase hamstring and gluteal activation for increased ease stepping down stairs.  Increased time with Nustep for increased activity tolerance and strengthening.     PT Next Visit Plan Continue wiht current PT POC to improve functional strengthening.  Resume glut med strengthening acitviites with side stepping/sumowalking and balance activiteis.        Problem List Patient Active Problem List   Diagnosis Date Noted  . S/P total knee replacement 08/08/2014  . Difficulty in walking(719.7) 02/15/2014  . Postoperative stiffness of total knee replacement 02/15/2014  . Pain in joint, lower leg 02/15/2014  . Abnormality of gait 10/11/2013   . S/P arthroscopy of right knee 10/09/2013  . Radicular pain of right lower extremity 07/25/2013  . Knee instability 07/25/2013  . Right knee pain 07/25/2013  . Chest pain 04/25/2013  . Patellar tendonitis 09/07/2012  . OA (osteoarthritis) of knee 02/09/2012  . GERD (gastroesophageal reflux disease) 12/01/2011  . Dysphagia 12/01/2011  . RUQ pain 12/01/2011  . Fatty liver 12/01/2011  . Hematochezia 12/01/2011  . INTERMITTENT VERTIGO 09/12/2010  . KNEE, ARTHRITIS, DEGEN./OSTEO 04/03/2009  . CHEST PAIN, RECURRENT 03/07/2009  . OTHER DYSPHAGIA 03/07/2009  . PALPITATIONS 02/06/2009  . LIVER FUNCTION TESTS, ABNORMAL, HX OF 01/01/2009  . MORBID OBESITY 12/31/2008  . PUD 12/31/2008  . BACK PAIN 12/24/2008  . ANSERINE BURSITIS 12/24/2008  . BENIGN POSITIONAL VERTIGO 07/16/2008  . H N P-LUMBAR 04/02/2008  . SPINAL STENOSIS, CERVICAL 02/15/2008  . SPINAL STENOSIS, LUMBAR 02/15/2008  . Patellar tendinitis 10/19/2007  . TEAR LATERAL MENISCUS 06/13/2007  . OBESITY NOS 10/29/2006  . ALLERGIC RHINITIS, SEASONAL 10/29/2006  . HYPOTHYROIDISM 09/07/2006  . HYPERLIPIDEMIA 09/07/2006  . HYPERTENSION 09/07/2006  . GERD 09/07/2006  . DIVERTICULOSIS, COLON 09/07/2006  . OVERACTIVE BLADDER 09/07/2006  . FIBROCYSTIC BREAST DISEASE 09/07/2006  . OSTEOARTHRITIS 09/07/2006   Ihor Austin, [place]  Aldona Lento 09/28/2014, 6:26 PM  [facility] [address] [place], [place], [address] Phone: [phone]   Fax:  [phone]

## 2014-10-01 ENCOUNTER — Ambulatory Visit (HOSPITAL_COMMUNITY)
Admission: RE | Admit: 2014-10-01 | Discharge: 2014-10-01 | Disposition: A | Discharge status: Home / Self Care | Payer: Medicare Other | Source: Ambulatory Visit | Attending: Family Medicine | Admitting: Family Medicine

## 2014-10-01 ENCOUNTER — Other Ambulatory Visit (INDEPENDENT_AMBULATORY_CARE_PROVIDER_SITE_OTHER): Payer: Self-pay | Admitting: Surgery

## 2014-10-01 ENCOUNTER — Ambulatory Visit (INDEPENDENT_AMBULATORY_CARE_PROVIDER_SITE_OTHER): Payer: Self-pay | Admitting: Surgery

## 2014-10-01 DIAGNOSIS — R262 Difficulty in walking, not elsewhere classified: Secondary | ICD-10-CM

## 2014-10-01 DIAGNOSIS — C50911 Malignant neoplasm of unspecified site of right female breast: Secondary | ICD-10-CM

## 2014-10-01 DIAGNOSIS — M25562 Pain in left knee: Secondary | ICD-10-CM | POA: Diagnosis not present

## 2014-10-01 DIAGNOSIS — Z96659 Presence of unspecified artificial knee joint: Secondary | ICD-10-CM

## 2014-10-01 DIAGNOSIS — D0511 Intraductal carcinoma in situ of right breast: Secondary | ICD-10-CM | POA: Diagnosis not present

## 2014-10-01 DIAGNOSIS — Z96652 Presence of left artificial knee joint: Secondary | ICD-10-CM | POA: Diagnosis not present

## 2014-10-01 DIAGNOSIS — R29898 Other symptoms and signs involving the musculoskeletal system: Secondary | ICD-10-CM

## 2014-10-01 DIAGNOSIS — Z471 Aftercare following joint replacement surgery: Secondary | ICD-10-CM | POA: Diagnosis not present

## 2014-10-01 DIAGNOSIS — R269 Unspecified abnormalities of gait and mobility: Secondary | ICD-10-CM

## 2014-10-01 DIAGNOSIS — M25669 Stiffness of unspecified knee, not elsewhere classified: Secondary | ICD-10-CM

## 2014-10-01 DIAGNOSIS — M25662 Stiffness of left knee, not elsewhere classified: Secondary | ICD-10-CM | POA: Diagnosis not present

## 2014-10-01 DIAGNOSIS — M1712 Unilateral primary osteoarthritis, left knee: Secondary | ICD-10-CM

## 2014-10-01 DIAGNOSIS — M25561 Pain in right knee: Secondary | ICD-10-CM

## 2014-10-01 DIAGNOSIS — T8489XA Other specified complication of internal orthopedic prosthetic devices, implants and grafts, initial encounter: Secondary | ICD-10-CM

## 2014-10-01 NOTE — H&P (Signed)
Sara [name] 10/01/2014 9:52 AM Location: [facility] Patient #: [id number] DOB: [birth year] Married / Language: English / Race: Black or African American Female History of Present Illness Sara Moores A. Reis Pienta MD; 10/01/2014 1:47 PM) Patient words: right breast ca   pt sent at the request of dr brown for right breast DCIS dx by core biopsy after mammography. pt denies mass or pain in either breast. had open biopsy left breast 1988 benign. no family history of breast cancer. no pain currently.  CLINICAL DATA: The patient returns after screening study for evaluation of the right breast.  EXAM: DIGITAL DIAGNOSTIC RIGHT MAMMOGRAM WITH CAD  ULTRASOUND RIGHT BREAST  COMPARISON: 09/05/2014 and earlier  ACR Breast Density Category c: The breast tissue is heterogeneously dense, which may obscure small masses.  FINDINGS: Additional views are performed showing dense fibroglandular tissue without definite mass in the upper portion of the right breast.  Mammographic images were processed with CAD.  On physical exam, I palpate no abnormality in the upper portion of the right breast.  Ultrasound is performed, showing a small irregular mass in the 12 o'clock location of the right breast 2 cm from the nipple which measures 0.7 x 0.5 x 0.4 cm. Evaluation of the right axilla is negative for adenopathy.  IMPRESSION: Irregular mass in the 12 o'clock location of the right breast. Tissue diagnosis is recommended.  RECOMMENDATION: Ultrasound-guided core biopsy is recommended and will be scheduled for the patient.  I have discussed the findings and recommendations with the patient. Results were also provided in writing at the conclusion of the visit. If applicable, a reminder letter will be sent to the patient regarding the next appointment.  BI-RADS CATEGORY 4: Suspicious.   Electronically Signed By: Sara [name] M.D. On: 09/18/2014  16:09                Patient: Sara [name], Sara [name] Collected: 09/25/2014 Client: [facility] Accession: [id number] Received: 09/25/2014 Sara Manes, MD DOB: 03-20-1946 Age: 9 Gender: F Reported: 09/26/2014 [address] Patient Ph: [phone] MRN #: [id number] [name], [name] [address] Visit #: [id number] Chart #: Phone: Fax: CC: Sara Beard, MD REPORT OF SURGICAL PATHOLOGY ADDITIONAL INFORMATION: PROGNOSTIC INDICATORS - ACIS Results: IMMUNOHISTOCHEMICAL AND MORPHOMETRIC ANALYSIS BY THE AUTOMATED CELLULAR IMAGING SYSTEM (ACIS) Estrogen Receptor: 100%, POSITIVE, STRONG STAINING INTENSITY Progesterone Receptor: 90%, POSITIVE, STRONG STAINING INTENSITY REFERENCE RANGE ESTROGEN RECEPTOR NEGATIVE <1% POSITIVE =>1% PROGESTERONE RECEPTOR NEGATIVE <1% POSITIVE =>1% All controls stained appropriately Sara Cutter MD Pathologist, Electronic Signature ( Signed 09/28/2014) FINAL DIAGNOSIS Diagnosis Breast, right, needle core biopsy, 11-12 o'clock, 2 cm from nipple - 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 Microscopic Comment There is intermediate grade ductal carcinoma in situ with focal necrosis. Estrogen and progesterone will be performed. 1 of 2 FINAL for Sara [name], Sara [name] (Sara [name]) Microscopic Comment(continued) Dr. Gari [name] agrees. Called to the [facility] on 09/26/14. (JDP:gt, 09/26/14) Sara Laws MD Pathologist, Electronic Signature (Case signed 09/26/2014) Specimen Gross and Clinical Information.  The patient is a 69 year old female   Other Problems Sara [name], Sara [name]; 10/01/2014 9:52 AM) Arthritis Back Pain Bladder Problems Breast Cancer Chest pain Gastric Ulcer Gastroesophageal Reflux Disease High blood pressure Hypercholesterolemia Lump In Breast Oophorectomy Bilateral. Thyroid Disease  Past Surgical History Sara [name], CMA; 10/01/2014 9:52 AM) Appendectomy Breast Biopsy Bilateral. Cataract  Surgery Bilateral. Hysterectomy (not due to cancer) - Complete Knee Surgery Bilateral. Mastectomy Left. Resection of Small Bowel Spinal Surgery - Lower Back Tonsillectomy  Diagnostic Studies History (  Sara [name], CMA; 10/01/2014 9:52 AM) Colonoscopy 5-10 years ago Mammogram within last year Pap Smear >5 years ago  Allergies Sara [name], CMA; 10/01/2014 9:53 AM) No Known Drug Allergies 10/01/2014  Medication History (Sara [name], CMA; 10/01/2014 9:56 AM) DiphenhydrAMINE HCl (25MG  Capsule, Oral) Active. Flonase Allergy Relief (50MCG/ACT Suspension, Nasal) Active. Gabapentin (600MG  Tablet, Oral) Active. Levothyroxine Sodium (88MCG Tablet, Oral) Active. LORazepam (1MG  Tablet, Oral) Active. Losartan Potassium-HCTZ (100-12.5MG  Tablet, Oral) Active. Metoprolol Tartrate (25MG  Tablet, Oral) Active. Omeprazole (40MG  Capsule DR, Oral) Active. Oxycodone-Acetaminophen (10-325MG  Tablet, Oral) Active. Pravastatin Sodium (40MG  Tablet, Oral) Active. Polyethylene Glycol 300 (as needed) Active.  Social History Sara [name], CMA; 10/01/2014 9:52 AM) Alcohol use Occasional alcohol use. Caffeine use Coffee, Tea. Tobacco use Former smoker.  Family History Sara [name], [place]; 10/01/2014 9:52 AM) Alcohol Abuse Father, Son. Arthritis Brother, Father, Sister. Cerebrovascular Accident Son. Diabetes Mellitus Family Members In General. Heart Disease Father. Hypertension Mother, Sister, Pandora Leiter. Thyroid problems Mother, Sister.  Pregnancy / Birth History Sara [name], [place]; 10/01/2014 9:52 AM) Age at menarche 65 years. Gravida 5 Irregular periods Maternal age 28-20 Para 5     Review of Systems ([place]; 10/01/2014 9:52 AM) General Present- Appetite Loss, Night Sweats and Weight Loss. Not Present- Chills, Fatigue, Fever and Weight Gain. Skin Present- Dryness. Not Present- Change in Wart/Mole, Hives, Jaundice, New Lesions, Non-Healing Wounds, Rash and  Ulcer. HEENT Present- Hoarseness, Nose Bleed, Ringing in the Ears, Seasonal Allergies and Wears glasses/contact lenses. Not Present- Earache, Hearing Loss, Oral Ulcers, Sinus Pain, Sore Throat, Visual Disturbances and Yellow Eyes. Respiratory Present- Snoring. Not Present- Bloody sputum, Chronic Cough, Difficulty Breathing and Wheezing. Breast Present- Breast Pain and Nipple Discharge. Not Present- Breast Mass and Skin Changes. Cardiovascular Present- Difficulty Breathing Lying Down, Rapid Heart Rate and Shortness of Breath. Not Present- Chest Pain, Leg Cramps, Palpitations and Swelling of Extremities. Gastrointestinal Present- Bloating, Change in Bowel Habits, Constipation, Excessive gas and Gets full quickly at meals. Not Present- Abdominal Pain, Bloody Stool, Chronic diarrhea, Difficulty Swallowing, Hemorrhoids, Indigestion, Nausea, Rectal Pain and Vomiting. Female Genitourinary Present- Nocturia. Not Present- Frequency, Painful Urination, Pelvic Pain and Urgency. Musculoskeletal Present- Back Pain, Joint Pain, Joint Stiffness and Muscle Weakness. Not Present- Muscle Pain and Swelling of Extremities. Neurological Present- Headaches, Numbness and Tingling. Not Present- Decreased Memory, Fainting, Seizures, Tremor, Trouble walking and Weakness. Psychiatric Present- Change in Sleep Pattern. Not Present- Anxiety, Bipolar, Depression, Fearful and Frequent crying. Endocrine Present- Hot flashes. Not Present- Cold Intolerance, Excessive Hunger, Hair Changes, Heat Intolerance and New Diabetes. Hematology Present- Easy Bruising. Not Present- Excessive bleeding, Gland problems, HIV and Persistent Infections.  Vitals (Sara [name] CMA; 10/01/2014 9:53 AM) 10/01/2014 9:52 AM Weight: 213 lb Height: 66in Body Surface Area: 2.12 m Body Mass Index: 34.38 kg/m Temp.: 34F(Temporal)  Pulse: 79 (Regular)  BP: 130/76 (Sitting, Left Arm, Standard)     Physical Exam (Jahmiya Guidotti A. Robben Jagiello MD; 10/01/2014  1:47 PM)  General Mental Status-Alert. General Appearance-Consistent with stated age. Hydration-Well hydrated. Voice-Normal.  Head and Neck Head-normocephalic, atraumatic with no lesions or palpable masses. Trachea-midline. Thyroid Gland Characteristics - normal size and consistency.  Eye Eyeball - Bilateral-Extraocular movements intact. Sclera/Conjunctiva - Bilateral-No scleral icterus.  Chest and Lung Exam Chest and lung exam reveals -quiet, even and easy respiratory effort with no use of accessory muscles and on auscultation, normal breath sounds, no adventitious sounds and normal vocal resonance. Inspection Chest Wall - Normal. Back - normal.  Breast Breast - Left-Symmetric, Non Tender, No Biopsy scars, no Dimpling,  No Inflammation, No Lumpectomy scars, No Mastectomy scars, No Peau d' Orange. Breast - Right-Symmetric, Non Tender, No Biopsy scars, no Dimpling, No Inflammation, No Lumpectomy scars, No Mastectomy scars, No Peau d' Orange. Breast Lump-No Palpable Breast Mass.  Cardiovascular Cardiovascular examination reveals -normal heart sounds, regular rate and rhythm with no murmurs and normal pedal pulses bilaterally.  Abdomen Inspection Inspection of the abdomen reveals - No Hernias. Skin - Scar - no surgical scars. Palpation/Percussion Palpation and Percussion of the abdomen reveal - Soft, Non Tender, No Rebound tenderness, No Rigidity (guarding) and No hepatosplenomegaly. Auscultation Auscultation of the abdomen reveals - Bowel sounds normal.  Neurologic Neurologic evaluation reveals -alert and oriented x 3 with no impairment of recent or remote memory. Mental Status-Normal.  Musculoskeletal Normal Exam - Left-Upper Extremity Strength Normal and Lower Extremity Strength Normal. Normal Exam - Right-Upper Extremity Strength Normal and Lower Extremity Strength Normal.  Lymphatic Head & Neck  General Head & Neck Lymphatics:  Bilateral - Description - Normal. Axillary  General Axillary Region: Bilateral - Description - Normal. Tenderness - Non Tender. Femoral & Inguinal  Generalized Femoral & Inguinal Lymphatics: Bilateral - Description - Normal. Tenderness - Non Tender.    Assessment & Plan (Zarielle Cea A. Telena Peyser MD; 10/01/2014 1:47 PM)  DCIS (DUCTAL CARCINOMA IN SITU), RIGHT (233.0  D05.11) Impression: Risk of lumpectomy include bleeding, infection, seroma, more surgery, use of seed/wire, wound care, cosmetic deformity and the need for other treatments, death , blood clots, death. Pt agrees to proceed. Risk of sentinel lymph node mapping include bleeding, infection, lymphedema, shoulder pain. stiffness, dye allergy. cosmetic deformity , blood clots, death, need for more surgery. Pt agres to proceed.  Discussed right breast conservation vs mastectomy and reconstruction. She desires breast conservation. Will need SLN mapping given comedo necrosis and location of area. Risk of sentinel lymph node mapping include bleeding, infection, lymphedema, shoulder pain. stiffness, dye allergy. cosmetic deformity , blood clots, death, need for more surgery. Pt agres to proceed.  Current Plans Pt Education - CSS Breast Biopsy Instructions (FLB): discussed with patient and provided information.

## 2014-10-01 NOTE — Therapy (Signed)
[place], [place], [address] Phone: [phone]   Fax:  [phone]  Physical Therapy Treatment  Patient Details  Name: Sara [name] MRN: [id number] Date of Birth: [birth year] Referring Provider:  [name], MD  Encounter Date: 10/01/2014      PT End of Session - 10/01/14 1513    Visit Number 13   Number of Visits 18   Date for PT Re-Evaluation 10/10/13   Authorization Type [employer]   Authorization Time Period Gcode complete 8th visit   Authorization - Visit Number 13   Authorization - Number of Visits 18   PT Start Time 1430   PT Stop Time 1514   PT Time Calculation (min) 44 min   Activity Tolerance Patient tolerated treatment well   Behavior During Therapy [facility] for tasks assessed/performed      Past Medical History  Diagnosis Date  . Thyroid disease   . Chronic back pain   . Reflux   . Unspecified arthropathy, shoulder region   . Pure hypercholesterolemia     takes Pravastin daily  . Allergic rhinitis due to pollen   . Cervicalgia   . Diverticulosis of colon (without mention of hemorrhage)   . Morbid obesity   . Palpitations   . Peptic ulcer, unspecified site, unspecified as acute or chronic, without mention of hemorrhage, perforation, or obstruction   . Peripheral vertigo, unspecified     takes Meclizine prn  . Reflux esophagitis   . Spinal stenosis in cervical region   . 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 Personal history of tobacco use, presenting hazards to health   . Insomnia, unspecified   . Lumbago   . Right leg pain   . Shortness of breath   . H/O hiatal hernia   . HTN (hypertension)     takes Metoprolol and Lisinopril daily  . Hypertension   . Sleep apnea     slight but doesn't require a CPAP  . History of bronchitis     > 31yr ago  . Seasonal allergies     uses Flonase daily  . Headache(784.0)     denies migraines since the 80's but has occ and takes Butalbital prn  . Cervical spondylosis without myelopathy     all over  . Degeneration of cervical intervertebral disc   . Primary localized osteoarthrosis, lower leg   . GERD (gastroesophageal reflux disease)     takes Omeprazole daily  . Constipation   . Diverticulosis   . History of bladder infections   . Urinary incontinence     occasionally  . Other postablative hypothyroidism     takes SYnthroid daily  . Insomnia     takes Ativan nigtly    Past Surgical History  Procedure Laterality Date  . Knee surgery      arthroscopy-- R  . Tonsillectomy    . Appendectomy    . Colonoscopy  Sept 2008    RMR: normal rectum, left-sided diverticula, repeat in 2018  . Abdominal hysterectomy    . Upper gastrointestinal endoscopy    . Breast lumpectomy      left   . Eye surgery      cataracts removed- bilateral, /w IOL  . Lumpectomy on pelvis    . Diagnostic laparoscopy    . Tubal ligation    . Esophagogastroduodenoscopy    . Anterior lat lumbar  fusion  05/13/2012    Procedure: ANTERIOR LATERAL LUMBAR FUSION 2 LEVELS;  Surgeon: Faythe Ghee, MD;  Location: [facility];  Service: Neurosurgery;  Laterality: Left;  Left Lumbar Three-four,Lumbar four-five Extreme Lumbar Interbody Fusion with Percutaneous Pedicle Screws  . Spinal fusion    . Knee arthroscopy with lateral menisectomy Right 10/06/2013    Procedure: KNEE ARTHROSCOPY WITH LATERAL AND MEDIAL MENISECTOMY;  Surgeon: Carole Civil, MD;  Location: [facility];  Service: Orthopedics;  Laterality: Right;  END @ 1234  . Foreign body removal Right 10/06/2013    Procedure: REMOVAL FOREIGN BODY EXTREMITY;  Surgeon: Carole Civil, MD;  Location: [facility];  Service: Orthopedics;  Laterality: Right;  . Injection knee Left 10/06/2013    Procedure: KNEE INJECTION;  Surgeon: Carole Civil, MD;  Location: [facility];  Service: Orthopedics;  Laterality: Left;  . Total knee arthroplasty  Right 01/24/2014    Procedure: TOTAL KNEE ARTHROPLASTY;  Surgeon: Carole Civil, MD;  Location: [facility];  Service: Orthopedics;  Laterality: Right;  . Total knee arthroplasty Left 08/08/2014    Procedure: TOTAL KNEE ARTHROPLASTY;  Surgeon: Carole Civil, MD;  Location: [facility];  Service: Orthopedics;  Laterality: Left;    There were no vitals taken for this visit.  Visit Diagnosis:  Primary osteoarthritis of left knee  Postoperative stiffness of total knee replacement, initial encounter  Left knee pain  Pain in joint, lower leg, left  Difficulty walking  Pain in joint, lower leg, right  Abnormality of gait  Left leg weakness      Subjective Assessment - 10/01/14 1435    Symptoms Pt reprots only mild complaints of pain in the Lt knee today at 2/10.   Pt saw MD in [place] today to discuss surgery for breast CA, awaiting MRI next week to determine date for surgery.    Currently in Pain? Yes   Pain Score 2    Pain Location Knee   Pain Orientation Left          [facility] PT Assessment - 10/01/14 1435    Assessment   Medical Diagnosis Lt TKA, lt knee stiffness, lt knee pain.    Onset Date 08/08/14   Next MD Visit Arther Abbott, 10/18/14   Flexibility   Quadriceps Lt 9 in to glute(was 9 in)                  [facility] Adult PT Treatment/Exercise - 10/01/14 1433    Exercises   Exercises Knee/Hip   Knee/Hip Exercises: Stretches   Active Hamstring Stretch 3 reps;20 seconds   Active Hamstring Stretch Limitations 3 Way, 17" box   Quad Stretch 2 reps;30 seconds   Quad Stretch Limitations prone with rope   Knee: Self-Stretch Limitations 10 reps for flexibiltiy   Gastroc Stretch 3 reps;20 seconds   Gastroc Stretch Limitations Slantboard 3 way   Knee/Hip Exercises: Aerobic   Stationary [place] #2, Lv 4, 8'   Knee/Hip Exercises: Standing   Other Standing Knee Exercises Lunge Matrix common x10 each   Other Standing Knee Exercises Sumo walk side/backward 1RT  each, BTB   Knee/Hip Exercises: Supine   Bridges 10 reps;Left   Bridges Limitations single leg bridge                  PT Short Term Goals - 09/28/14 1825    PT SHORT TERM GOAL #1   Title Patient will display increaseed knee flexion to 110 degrees to more  easily squat to chair without weight shifting to Rt LE   Status Achieved   PT SHORT TERM GOAL #2   Title Patient will be able to demosntrate 5x sit to stand in < 15 seconds indicating patient not at high risk for falls.    Status Achieved   PT SHORT TERM GOAL #3   Title Patient will demonstrate increased ankle dorsiflexion of >10 degrees.    Status On-going   PT SHORT TERM GOAL #4   Title Patient will report walking >48minutes with tolerable pain.    Status On-going           PT Long Term Goals - 09/28/14 1825    PT LONG TERM GOAL #1   Title Patient will display increaseed knee flexion to 120 degrees to more easily squat to chair without weight shifting to Rt LE   Status Achieved   PT LONG TERM GOAL #2   Title Patient will display increased knee flexion and extension strength on Lt to 5/5 MMT to be able to go up and down stairs without UE support.    Status On-going   PT LONG TERM GOAL #3   Title Patient will report walking >32minutes to get groceries with pain <2/10   Status On-going               Plan - 10/01/14 1514    Clinical Impression Statement Pt had MD visit for breast CA this morning and is pending surgery for this; pt reports feeling generalized malaise and feels upset about the situation.  Noted increased rest breaks today, on NuStep and between exercises, as pt reports she has not been eating too much since dx last week; educated pt on importance of maintaining food intake and activity to tolerance prior to surgery.  Pt was able to complete some of exercise program today, without complaints of pain only fatigue.  Added lunge matrix to work on balance and strengthening program, with most  difficulty in posterior direction.     Pt will benefit from skilled therapeutic intervention in order to improve on the following deficits Abnormal gait;Decreased strength;Pain;Difficulty walking;Decreased mobility;Decreased activity tolerance;Decreased balance;Decreased range of motion;Increased fascial restricitons;Improper body mechanics;Impaired flexibility;Decreased endurance   Rehab Potential Fair   PT Frequency 3x / week   PT Duration 6 weeks   PT Next Visit Plan Continue wiht current PT POC to improve functional strengthening.  Resume glut med strengthening acitviites with side stepping/sumowalking and balance activiteis.        Problem List Patient Active Problem List   Diagnosis Date Noted  . S/P total knee replacement 08/08/2014  . Difficulty in walking(719.7) 02/15/2014  . Postoperative stiffness of total knee replacement 02/15/2014  . Pain in joint, lower leg 02/15/2014  . Abnormality of gait 10/11/2013  . S/P arthroscopy of right knee 10/09/2013  . Radicular pain of right lower extremity 07/25/2013  . Knee instability 07/25/2013  . Right knee pain 07/25/2013  . Chest pain 04/25/2013  . Patellar tendonitis 09/07/2012  . OA (osteoarthritis) of knee 02/09/2012  . GERD (gastroesophageal reflux disease) 12/01/2011  . Dysphagia 12/01/2011  . RUQ pain 12/01/2011  . Fatty liver 12/01/2011  . Hematochezia 12/01/2011  . INTERMITTENT VERTIGO 09/12/2010  . KNEE, ARTHRITIS, DEGEN./OSTEO 04/03/2009  . CHEST PAIN, RECURRENT 03/07/2009  . OTHER DYSPHAGIA 03/07/2009  . PALPITATIONS 02/06/2009  . LIVER FUNCTION TESTS, ABNORMAL, HX OF 01/01/2009  . MORBID OBESITY 12/31/2008  . PUD 12/31/2008  . BACK PAIN 12/24/2008  . ANSERINE BURSITIS  12/24/2008  . BENIGN POSITIONAL VERTIGO 07/16/2008  . H N P-LUMBAR 04/02/2008  . SPINAL STENOSIS, CERVICAL 02/15/2008  . SPINAL STENOSIS, LUMBAR 02/15/2008  . Patellar tendinitis 10/19/2007  . TEAR LATERAL MENISCUS 06/13/2007  . OBESITY NOS  10/29/2006  . ALLERGIC RHINITIS, SEASONAL 10/29/2006  . HYPOTHYROIDISM 09/07/2006  . HYPERLIPIDEMIA 09/07/2006  . HYPERTENSION 09/07/2006  . GERD 09/07/2006  . DIVERTICULOSIS, COLON 09/07/2006  . OVERACTIVE BLADDER 09/07/2006  . FIBROCYSTIC BREAST DISEASE 09/07/2006  . OSTEOARTHRITIS 09/07/2006   Lonna Cobb, DPT [phone]  [place] [address] [place], [place], [address] Phone: [phone]   Fax:  [phone]

## 2014-10-03 ENCOUNTER — Ambulatory Visit (HOSPITAL_COMMUNITY)
Admission: RE | Admit: 2014-10-03 | Discharge: 2014-10-03 | Disposition: A | Discharge status: Home / Self Care | Payer: Medicare Other | Source: Ambulatory Visit | Attending: Orthopedic Surgery | Admitting: Orthopedic Surgery

## 2014-10-03 DIAGNOSIS — M25669 Stiffness of unspecified knee, not elsewhere classified: Secondary | ICD-10-CM

## 2014-10-03 DIAGNOSIS — M25562 Pain in left knee: Secondary | ICD-10-CM

## 2014-10-03 DIAGNOSIS — R262 Difficulty in walking, not elsewhere classified: Secondary | ICD-10-CM | POA: Diagnosis not present

## 2014-10-03 DIAGNOSIS — M25561 Pain in right knee: Secondary | ICD-10-CM

## 2014-10-03 DIAGNOSIS — T8489XA Other specified complication of internal orthopedic prosthetic devices, implants and grafts, initial encounter: Secondary | ICD-10-CM

## 2014-10-03 DIAGNOSIS — M25662 Stiffness of left knee, not elsewhere classified: Secondary | ICD-10-CM | POA: Diagnosis not present

## 2014-10-03 DIAGNOSIS — Z96659 Presence of unspecified artificial knee joint: Secondary | ICD-10-CM

## 2014-10-03 DIAGNOSIS — M1712 Unilateral primary osteoarthritis, left knee: Secondary | ICD-10-CM

## 2014-10-03 DIAGNOSIS — Z96652 Presence of left artificial knee joint: Secondary | ICD-10-CM | POA: Diagnosis not present

## 2014-10-03 DIAGNOSIS — Z471 Aftercare following joint replacement surgery: Secondary | ICD-10-CM | POA: Diagnosis not present

## 2014-10-03 DIAGNOSIS — R269 Unspecified abnormalities of gait and mobility: Secondary | ICD-10-CM

## 2014-10-03 NOTE — Therapy (Signed)
[place], [place], [address] Phone: [phone]   Fax:  [phone]  Physical Therapy Treatment  Patient Details  Name: Sara [name] MRN: [id number] Date of Birth: 1946/06/20 Referring Provider:  Carole Civil, MD  Encounter Date: 10/03/2014      PT End of Session - 10/03/14 1627    Visit Number 14   Number of Visits 18   Date for PT Re-Evaluation 10/10/13   Authorization Type [employer] Medicare   Authorization Time Period Gcode complete 8th visit   Authorization - Visit Number 15   Authorization - Number of Visits 18   PT Start Time 1435   PT Stop Time 1515   PT Time Calculation (min) 40 min   Activity Tolerance Patient tolerated treatment well   Behavior During Therapy [facility] for tasks assessed/performed      Past Medical History  Diagnosis Date  . Thyroid disease   . Chronic back pain   . Reflux   . Unspecified arthropathy, shoulder region   . Pure hypercholesterolemia     takes Pravastin daily  . Allergic rhinitis due to pollen   . Cervicalgia   . Diverticulosis of colon (without mention of hemorrhage)   . Morbid obesity   . Palpitations   . Peptic ulcer, unspecified site, unspecified as acute or chronic, without mention of hemorrhage, perforation, or obstruction   . Peripheral vertigo, unspecified     takes Meclizine prn  . Reflux esophagitis   . Spinal stenosis in cervical region   . 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 Personal history of tobacco use, presenting hazards to health   . Insomnia, unspecified   . Lumbago   . Right leg pain   . Shortness of breath   . H/O hiatal hernia   . HTN (hypertension)     takes Metoprolol and Lisinopril daily  . Hypertension   . Sleep apnea     slight but doesn't require a CPAP  . History of bronchitis     > 38yr ago  . Seasonal allergies     uses Flonase daily  .  Headache(784.0)     denies migraines since the 80's but has occ and takes Butalbital prn  . Cervical spondylosis without myelopathy     all over  . Degeneration of cervical intervertebral disc   . Primary localized osteoarthrosis, lower leg   . GERD (gastroesophageal reflux disease)     takes Omeprazole daily  . Constipation   . Diverticulosis   . History of bladder infections   . Urinary incontinence     occasionally  . Other postablative hypothyroidism     takes SYnthroid daily  . Insomnia     takes Ativan nigtly    Past Surgical History  Procedure Laterality Date  . Knee surgery      arthroscopy-- R  . Tonsillectomy    . Appendectomy    . Colonoscopy  Sept 2008    RMR: normal rectum, left-sided diverticula, repeat in 2018  . Abdominal hysterectomy    . Upper gastrointestinal endoscopy    . Breast lumpectomy      left   . Eye surgery      cataracts removed- bilateral, /w IOL  . Lumpectomy on pelvis    . Diagnostic laparoscopy    . Tubal ligation    . Esophagogastroduodenoscopy    . Anterior  lat lumbar fusion  05/13/2012    Procedure: ANTERIOR LATERAL LUMBAR FUSION 2 LEVELS;  Surgeon: Faythe Ghee, MD;  Location: [place] [facility];  Service: Neurosurgery;  Laterality: Left;  Left Lumbar Three-four,Lumbar four-five Extreme Lumbar Interbody Fusion with Percutaneous Pedicle Screws  . Spinal fusion    . Knee arthroscopy with lateral menisectomy Right 10/06/2013    Procedure: KNEE ARTHROSCOPY WITH LATERAL AND MEDIAL MENISECTOMY;  Surgeon: Carole Civil, MD;  [facility]: [facility];  Service: Orthopedics;  Laterality: Right;  END @ 1234  . Foreign body removal Right 10/06/2013    Procedure: REMOVAL FOREIGN BODY EXTREMITY;  Surgeon: Carole Civil, MD;  Location: [facility];  Service: Orthopedics;  Laterality: Right;  . Injection knee Left 10/06/2013    Procedure: KNEE INJECTION;  Surgeon: Carole Civil, MD;  Location: [facility];  Service: Orthopedics;  Laterality: Left;  . Total  knee arthroplasty Right 01/24/2014    Procedure: TOTAL KNEE ARTHROPLASTY;  Surgeon: Carole Civil, MD;  Location: [facility];  Service: Orthopedics;  Laterality: Right;  . Total knee arthroplasty Left 08/08/2014    Procedure: TOTAL KNEE ARTHROPLASTY;  Surgeon: Carole Civil, MD;  Location: [facility];  Service: Orthopedics;  Laterality: Left;    There were no vitals taken for this visit.  Visit Diagnosis:  Primary osteoarthritis of left knee  Postoperative stiffness of total knee replacement, initial encounter  Left knee pain  Pain in joint, lower leg, left  Difficulty walking  Pain in joint, lower leg, right  Abnormality of gait  Left leg weakness      Subjective Assessment - 10/03/14 1442    Symptoms Patient reports stiffness in her legas with minimal pain. MRI schedules for 10/09/14 and appointment on 21st with radioogy/oncology.    Currently in Pain? Yes   Pain Score 1    Pain Location Knee             [facility] Adult PT Treatment/Exercise - 10/03/14 0001    Knee/Hip Exercises: Stretches   Active Hamstring Stretch 2 reps;20 seconds   Quad Stretch 2 reps;20 seconds   Knee: Self-Stretch Limitations 3 way knee driver on 14" box. 10x   Gastroc Stretch 2 reps;20 seconds   Gastroc Stretch Limitations Slantboard 3 way   Knee/Hip Exercises: Aerobic   Stationary [place] #2, Lv 4, 8'   Knee/Hip Exercises: Standing   Lateral Step Up 10 reps;Step Height: 8"   Forward Step Up Step Height: 8";10 reps   Step Down Left;10 reps;Hand Hold: 2;Step Height: 2"   Step Down Limitations single leg reach common from 2in step   Functional Squat Limitations 3D hip excursions 10x   SLS Single leg balance reach matrix common 5x   Other Standing Knee Exercises Lunge Matrix common x10 each   Other Standing Knee Exercises Sumo walk side/backward 1RT each, BTB           PT Short Term Goals - 09/28/14 1825    PT SHORT TERM GOAL #1   Title Patient will display increaseed knee  flexion to 110 degrees to more easily squat to chair without weight shifting to Rt LE   Status Achieved   PT SHORT TERM GOAL #2   Title Patient will be able to demosntrate 5x sit to stand in < 15 seconds indicating patient not at high risk for falls.    Status Achieved   PT SHORT TERM GOAL #3   Title Patient will demonstrate increased ankle dorsiflexion of >10 degrees.  Status On-going   PT SHORT TERM GOAL #4   Title Patient will report walking >52minutes with tolerable pain.    Status On-going           PT Long Term Goals - 09/28/14 1825    PT LONG TERM GOAL #1   Title Patient will display increaseed knee flexion to 120 degrees to more easily squat to chair without weight shifting to Rt LE   Status Achieved   PT LONG TERM GOAL #2   Title Patient will display increased knee flexion and extension strength on Lt to 5/5 MMT to be able to go up and down stairs without UE support.    Status On-going   PT LONG TERM GOAL #3   Title Patient will report walking >56minutes to get groceries with pain <2/10   Status On-going               Plan - 10/03/14 1632    Clinical Impression Statement patient displays imrpoving knee knee mobility thoguh still limited strength resultignin difficulty descending steps >2 inches, and with 8" step ups on Lt LE. Session focuses on LE strengthening to assist patient return to regular performance of stairs.    PT Next Visit Plan Continue with functional strengthening increasing single leg balance reach depth        Problem List Patient Active Problem List   Diagnosis Date Noted  . S/P total knee replacement 08/08/2014  . Difficulty in walking(719.7) 02/15/2014  . Postoperative stiffness of total knee replacement 02/15/2014  . Pain in joint, lower leg 02/15/2014  . Abnormality of gait 10/11/2013  . S/P arthroscopy of right knee 10/09/2013  . Radicular pain of right lower extremity 07/25/2013  . Knee instability 07/25/2013  . Right knee  pain 07/25/2013  . Chest pain 04/25/2013  . Patellar tendonitis 09/07/2012  . OA (osteoarthritis) of knee 02/09/2012  . GERD (gastroesophageal reflux disease) 12/01/2011  . Dysphagia 12/01/2011  . RUQ pain 12/01/2011  . Fatty liver 12/01/2011  . Hematochezia 12/01/2011  . INTERMITTENT VERTIGO 09/12/2010  . KNEE, ARTHRITIS, DEGEN./OSTEO 04/03/2009  . CHEST PAIN, RECURRENT 03/07/2009  . OTHER DYSPHAGIA 03/07/2009  . PALPITATIONS 02/06/2009  . LIVER FUNCTION TESTS, ABNORMAL, HX OF 01/01/2009  . MORBID OBESITY 12/31/2008  . PUD 12/31/2008  . BACK PAIN 12/24/2008  . ANSERINE BURSITIS 12/24/2008  . BENIGN POSITIONAL VERTIGO 07/16/2008  . H N P-LUMBAR 04/02/2008  . SPINAL STENOSIS, CERVICAL 02/15/2008  . SPINAL STENOSIS, LUMBAR 02/15/2008  . Patellar tendinitis 10/19/2007  . TEAR LATERAL MENISCUS 06/13/2007  . OBESITY NOS 10/29/2006  . ALLERGIC RHINITIS, SEASONAL 10/29/2006  . HYPOTHYROIDISM 09/07/2006  . HYPERLIPIDEMIA 09/07/2006  . HYPERTENSION 09/07/2006  . GERD 09/07/2006  . DIVERTICULOSIS, COLON 09/07/2006  . OVERACTIVE BLADDER 09/07/2006  . FIBROCYSTIC BREAST DISEASE 09/07/2006  . OSTEOARTHRITIS 09/07/2006   Devona Konig PT DPT [facility] [address] [place], [place], [address] Phone: [phone]   Fax:  [phone]

## 2014-10-05 ENCOUNTER — Ambulatory Visit (HOSPITAL_COMMUNITY)
Admission: RE | Admit: 2014-10-05 | Discharge: 2014-10-05 | Disposition: A | Discharge status: Home / Self Care | Payer: Medicare Other | Source: Ambulatory Visit | Attending: Orthopedic Surgery | Admitting: Orthopedic Surgery

## 2014-10-05 DIAGNOSIS — R269 Unspecified abnormalities of gait and mobility: Secondary | ICD-10-CM

## 2014-10-05 DIAGNOSIS — Z96659 Presence of unspecified artificial knee joint: Secondary | ICD-10-CM

## 2014-10-05 DIAGNOSIS — M1712 Unilateral primary osteoarthritis, left knee: Secondary | ICD-10-CM

## 2014-10-05 DIAGNOSIS — Z96652 Presence of left artificial knee joint: Secondary | ICD-10-CM | POA: Diagnosis not present

## 2014-10-05 DIAGNOSIS — Z471 Aftercare following joint replacement surgery: Secondary | ICD-10-CM | POA: Diagnosis not present

## 2014-10-05 DIAGNOSIS — R262 Difficulty in walking, not elsewhere classified: Secondary | ICD-10-CM

## 2014-10-05 DIAGNOSIS — M25562 Pain in left knee: Secondary | ICD-10-CM | POA: Diagnosis not present

## 2014-10-05 DIAGNOSIS — R29898 Other symptoms and signs involving the musculoskeletal system: Secondary | ICD-10-CM

## 2014-10-05 DIAGNOSIS — M25669 Stiffness of unspecified knee, not elsewhere classified: Secondary | ICD-10-CM

## 2014-10-05 DIAGNOSIS — M25561 Pain in right knee: Secondary | ICD-10-CM

## 2014-10-05 DIAGNOSIS — M25662 Stiffness of left knee, not elsewhere classified: Secondary | ICD-10-CM | POA: Diagnosis not present

## 2014-10-05 DIAGNOSIS — T8489XA Other specified complication of internal orthopedic prosthetic devices, implants and grafts, initial encounter: Secondary | ICD-10-CM

## 2014-10-05 NOTE — Therapy (Signed)
[place], [place], [address] Phone: [phone]   Fax:  [phone]  Physical Therapy Treatment  Patient Details  Name: Sara [name] MRN: [id number] Date of Birth: [birth year] Referring Provider:  Carole Civil, MD  Encounter Date: 10/05/2014      PT End of Session - 10/05/14 1643    Visit Number 15   Number of Visits 18   Date for PT Re-Evaluation 10/10/13   Authorization Type [employer] Medicare   Authorization Time Period Gcode complete 8th visit   Authorization - Visit Number 15   Authorization - Number of Visits 18   PT Start Time 1600   PT Stop Time 1650   PT Time Calculation (min) 50 min   Activity Tolerance Patient tolerated treatment well   Behavior During Therapy [facility] for tasks assessed/performed      Past Medical History  Diagnosis Date  . Thyroid disease   . Chronic back pain   . Reflux   . Unspecified arthropathy, shoulder region   . Pure hypercholesterolemia     takes Pravastin daily  . Allergic rhinitis due to pollen   . Cervicalgia   . Diverticulosis of colon (without mention of hemorrhage)   . Morbid obesity   . Palpitations   . Peptic ulcer, unspecified site, unspecified as acute or chronic, without mention of hemorrhage, perforation, or obstruction   . Peripheral vertigo, unspecified     takes Meclizine prn  . Reflux esophagitis   . Spinal stenosis in cervical region   . 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 Personal history of tobacco use, presenting hazards to health   . Insomnia, unspecified   . Lumbago   . Right leg pain   . Shortness of breath   . H/O hiatal hernia   . HTN (hypertension)     takes Metoprolol and Lisinopril daily  . Hypertension   . Sleep apnea     slight but doesn't require a CPAP  . History of bronchitis     > 49yr ago  . Seasonal allergies     uses Flonase daily  .  Headache(784.0)     denies migraines since the 80's but has occ and takes Butalbital prn  . Cervical spondylosis without myelopathy     all over  . Degeneration of cervical intervertebral disc   . Primary localized osteoarthrosis, lower leg   . GERD (gastroesophageal reflux disease)     takes Omeprazole daily  . Constipation   . Diverticulosis   . History of bladder infections   . Urinary incontinence     occasionally  . Other postablative hypothyroidism     takes SYnthroid daily  . Insomnia     takes Ativan nigtly    Past Surgical History  Procedure Laterality Date  . Knee surgery      arthroscopy-- R  . Tonsillectomy    . Appendectomy    . Colonoscopy  Sept 2008    RMR: normal rectum, left-sided diverticula, repeat in 2018  . Abdominal hysterectomy    . Upper gastrointestinal endoscopy    . Breast lumpectomy      left   . Eye surgery      cataracts removed- bilateral, /w IOL  . Lumpectomy on pelvis    . Diagnostic laparoscopy    . Tubal ligation    . Esophagogastroduodenoscopy    . Anterior  lat lumbar fusion  05/13/2012    Procedure: ANTERIOR LATERAL LUMBAR FUSION 2 LEVELS;  Surgeon: Faythe Ghee, MD;  Location: [place] [facility];  Service: Neurosurgery;  Laterality: Left;  Left Lumbar Three-four,Lumbar four-five Extreme Lumbar Interbody Fusion with Percutaneous Pedicle Screws  . Spinal fusion    . Knee arthroscopy with lateral menisectomy Right 10/06/2013    Procedure: KNEE ARTHROSCOPY WITH LATERAL AND MEDIAL MENISECTOMY;  Surgeon: Carole Civil, MD;  Location: [facility];  Service: Orthopedics;  Laterality: Right;  END @ 1234  . Foreign body removal Right 10/06/2013    Procedure: REMOVAL FOREIGN BODY EXTREMITY;  Surgeon: Carole Civil, MD;  Location: [facility];  Service: Orthopedics;  Laterality: Right;  . Injection knee Left 10/06/2013    Procedure: KNEE INJECTION;  Surgeon: Carole Civil, MD;  Location: [facility];  Service: Orthopedics;  Laterality: Left;  . Total  knee arthroplasty Right 01/24/2014    Procedure: TOTAL KNEE ARTHROPLASTY;  Surgeon: Carole Civil, MD;  Location: [facility];  Service: Orthopedics;  Laterality: Right;  . Total knee arthroplasty Left 08/08/2014    Procedure: TOTAL KNEE ARTHROPLASTY;  Surgeon: Carole Civil, MD;  Location: [facility];  Service: Orthopedics;  Laterality: Left;    There were no vitals taken for this visit.  Visit Diagnosis:  Primary osteoarthritis of left knee  Postoperative stiffness of total knee replacement, initial encounter  Left knee pain  Pain in joint, lower leg, left  Difficulty walking  Pain in joint, lower leg, right  Abnormality of gait  Left leg weakness  Right knee pain      Subjective Assessment - 10/05/14 1606    Symptoms Patient states having no pain at this moment though she notes having taken pain medication prior to visit.    Currently in Pain? No/denies                    [facility] Adult PT Treatment/Exercise - 10/05/14 1635    Exercises   Exercises Knee/Hip   Knee/Hip Exercises: Stretches   Active Hamstring Stretch 20 seconds;1 rep   Active Hamstring Stretch Limitations 8" 3 way   Hip Flexor Stretch 20 seconds;1 rep   Hip Flexor Stretch Limitations 8" 3 way   Gastroc Stretch 1 rep;20 seconds   Gastroc Stretch Limitations 3 way on slant board   Knee/Hip Exercises: Aerobic   Stationary [place] #4, Lv 3, 8'   Knee/Hip Exercises: Standing   Lateral Step Up 10 reps;Step Height: 8"   Forward Step Up Step Height: 8";10 reps   Step Down Left;10 reps;Hand Hold: 2;Step Height: 2"   Step Down Limitations single leg reach common from 2in step   Functional Squat 3 sets;5 reps   Functional Squat Limitations squat matrix   SLS Single leg balance reach matrix common 5x   Other Standing Knee Exercises Lunge Matrix common x10 each   Other Standing Knee Exercises Sumo walk side/backward 1RT each, BTB                  PT Short Term Goals - 10/05/14  1652    PT SHORT TERM GOAL #1   Title Patient will display increaseed knee flexion to 110 degrees to more easily squat to chair without weight shifting to Rt LE   PT SHORT TERM GOAL #2   Title Patient will be able to demosntrate 5x sit to stand in < 15 seconds indicating patient not at high risk for falls.  PT SHORT TERM GOAL #3   Title Patient will demonstrate increased ankle dorsiflexion of >10 degrees.    Status On-going   PT SHORT TERM GOAL #4   Title Patient will report walking >74minutes with tolerable pain.    Status On-going           PT Long Term Goals - 10/05/14 1653    PT LONG TERM GOAL #1   Title Patient will display increaseed knee flexion to 120 degrees to more easily squat to chair without weight shifting to Rt LE   PT LONG TERM GOAL #2   Status On-going   PT LONG TERM GOAL #3   Title Patient will report walking >43minutes to get groceries with pain <2/10   Status On-going               Plan - 10/05/14 1647    Clinical Impression Statement Session focus on improving functional strengthening, pt continues to have most difficulty descending steps and increased depth with squats.  Overall knee mobilty continues to improve with proper gait mechanics and improved activity tolerance.  Able to ncrease resistance on Nustep with ability to keep steps per minute >100.   PT Next Visit Plan Continue with functional strengthening increasing single leg balance reach depth        Problem List Patient Active Problem List   Diagnosis Date Noted  . S/P total knee replacement 08/08/2014  . Difficulty in walking(719.7) 02/15/2014  . Postoperative stiffness of total knee replacement 02/15/2014  . Pain in joint, lower leg 02/15/2014  . Abnormality of gait 10/11/2013  . S/P arthroscopy of right knee 10/09/2013  . Radicular pain of right lower extremity 07/25/2013  . Knee instability 07/25/2013  . Right knee pain 07/25/2013  . Chest pain 04/25/2013  . Patellar  tendonitis 09/07/2012  . OA (osteoarthritis) of knee 02/09/2012  . GERD (gastroesophageal reflux disease) 12/01/2011  . Dysphagia 12/01/2011  . RUQ pain 12/01/2011  . Fatty liver 12/01/2011  . Hematochezia 12/01/2011  . INTERMITTENT VERTIGO 09/12/2010  . KNEE, ARTHRITIS, DEGEN./OSTEO 04/03/2009  . CHEST PAIN, RECURRENT 03/07/2009  . OTHER DYSPHAGIA 03/07/2009  . PALPITATIONS 02/06/2009  . LIVER FUNCTION TESTS, ABNORMAL, HX OF 01/01/2009  . MORBID OBESITY 12/31/2008  . PUD 12/31/2008  . BACK PAIN 12/24/2008  . ANSERINE BURSITIS 12/24/2008  . BENIGN POSITIONAL VERTIGO 07/16/2008  . H N P-LUMBAR 04/02/2008  . SPINAL STENOSIS, CERVICAL 02/15/2008  . SPINAL STENOSIS, LUMBAR 02/15/2008  . Patellar tendinitis 10/19/2007  . TEAR LATERAL MENISCUS 06/13/2007  . OBESITY NOS 10/29/2006  . ALLERGIC RHINITIS, SEASONAL 10/29/2006  . HYPOTHYROIDISM 09/07/2006  . HYPERLIPIDEMIA 09/07/2006  . HYPERTENSION 09/07/2006  . GERD 09/07/2006  . DIVERTICULOSIS, COLON 09/07/2006  . OVERACTIVE BLADDER 09/07/2006  . FIBROCYSTIC BREAST DISEASE 09/07/2006  . OSTEOARTHRITIS 09/07/2006   Ihor Austin, [place]  Aldona Lento 10/05/2014, 4:54 PM  [facility] [address] [place], [place], [address] Phone: [phone]   Fax:  [phone]

## 2014-10-05 NOTE — Progress Notes (Signed)
Location of Breast Cancer:Right Breast  Histology per Pathology Report:09/25/2014 Diagnosis Breast, right, needle core biopsy, 11-12 o'clock, 2 cm from nipple - 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  Receptor Status: ER(+), PR (+), Her2-neu ()  Did patient present with symptoms (if so, please note symptoms) or was this found on screening mammography?:found on screening.  Past/Anticipated interventions by surgeon, if JJH:[id number] for surgery October 25, 2014  Past/Anticipated interventions by medical oncology, if any: Chemotherapy   Lymphedema issues, if any:No  Pain issues, if YJE:HUDJSH is sore and bruised from biopsy.Chronic right knee pain  SAFETY ISSUES:  Prior radiation? No  Pacemaker/ICD? No  Possible current pregnancy?NO Is the patient on methotrexate?NO   Current Complaints / other details: Married.menarcha age 53. First child age 64. 55 vaginal births.1 child lived only one day.Last menstrual cycle April 1975.No hormonal replacement therapy.No immediate family history of breast cancer.  Quit smoking in 1987 after 1 ppd for 27 years Drug allergies:darvon and propoxyphene Latex allergy, mold, dust mites, smuts and latex    Arlyss Repress, RN 10/05/2014,8:14 AM

## 2014-10-08 ENCOUNTER — Ambulatory Visit (HOSPITAL_COMMUNITY)
Admission: RE | Admit: 2014-10-08 | Discharge: 2014-10-08 | Disposition: A | Discharge status: Home / Self Care | Payer: Medicare Other | Source: Ambulatory Visit | Attending: Orthopedic Surgery | Admitting: Orthopedic Surgery

## 2014-10-08 ENCOUNTER — Other Ambulatory Visit: Payer: Self-pay | Admitting: Surgery

## 2014-10-08 DIAGNOSIS — M1712 Unilateral primary osteoarthritis, left knee: Secondary | ICD-10-CM

## 2014-10-08 DIAGNOSIS — R29898 Other symptoms and signs involving the musculoskeletal system: Secondary | ICD-10-CM

## 2014-10-08 DIAGNOSIS — M25669 Stiffness of unspecified knee, not elsewhere classified: Secondary | ICD-10-CM

## 2014-10-08 DIAGNOSIS — R262 Difficulty in walking, not elsewhere classified: Secondary | ICD-10-CM | POA: Diagnosis not present

## 2014-10-08 DIAGNOSIS — Z96652 Presence of left artificial knee joint: Secondary | ICD-10-CM | POA: Diagnosis not present

## 2014-10-08 DIAGNOSIS — Z471 Aftercare following joint replacement surgery: Secondary | ICD-10-CM | POA: Diagnosis not present

## 2014-10-08 DIAGNOSIS — M25561 Pain in right knee: Secondary | ICD-10-CM

## 2014-10-08 DIAGNOSIS — T8489XA Other specified complication of internal orthopedic prosthetic devices, implants and grafts, initial encounter: Secondary | ICD-10-CM

## 2014-10-08 DIAGNOSIS — M25562 Pain in left knee: Secondary | ICD-10-CM

## 2014-10-08 DIAGNOSIS — R269 Unspecified abnormalities of gait and mobility: Secondary | ICD-10-CM

## 2014-10-08 DIAGNOSIS — M25662 Stiffness of left knee, not elsewhere classified: Secondary | ICD-10-CM | POA: Diagnosis not present

## 2014-10-08 DIAGNOSIS — D0511 Intraductal carcinoma in situ of right breast: Secondary | ICD-10-CM

## 2014-10-08 DIAGNOSIS — Z96659 Presence of unspecified artificial knee joint: Secondary | ICD-10-CM

## 2014-10-08 NOTE — Therapy (Signed)
[place], [place], [address] Phone: [phone]   Fax:  [phone]  Physical Therapy Treatment  Patient Details  Name: Sara [name] MRN: [id number] Date of Birth: [birth year] Referring Provider:  Carole Civil, MD  Encounter Date: 10/08/2014      PT End of Session - 10/08/14 1008    Visit Number 16   Number of Visits 18   Date for PT Re-Evaluation 10/24/14   Authorization Type [employer] Medicare   Authorization Time Period Gcode complete 8th visit   Authorization - Visit Number 16   Authorization - Number of Visits 18   PT Start Time 0935   PT Stop Time 1015   PT Time Calculation (min) 40 min   Activity Tolerance Patient tolerated treatment well   Behavior During Therapy [facility] for tasks assessed/performed      Past Medical History  Diagnosis Date  . Thyroid disease   . Chronic back pain   . Reflux   . Unspecified arthropathy, shoulder region   . Pure hypercholesterolemia     takes Pravastin daily  . Allergic rhinitis due to pollen   . Cervicalgia   . Diverticulosis of colon (without mention of hemorrhage)   . Morbid obesity   . Palpitations   . Peptic ulcer, unspecified site, unspecified as acute or chronic, without mention of hemorrhage, perforation, or obstruction   . Peripheral vertigo, unspecified     takes Meclizine prn  . Reflux esophagitis   . Spinal stenosis in cervical region   . 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 Personal history of tobacco use, presenting hazards to health   . Insomnia, unspecified   . Lumbago   . Right leg pain   . Shortness of breath   . H/O hiatal hernia   . HTN (hypertension)     takes Metoprolol and Lisinopril daily  . Hypertension   . Sleep apnea     slight but doesn't require a CPAP  . History of bronchitis     > 39yr ago  . Seasonal allergies     uses Flonase daily  .  Headache(784.0)     denies migraines since the 80's but has occ and takes Butalbital prn  . Cervical spondylosis without myelopathy     all over  . Degeneration of cervical intervertebral disc   . Primary localized osteoarthrosis, lower leg   . GERD (gastroesophageal reflux disease)     takes Omeprazole daily  . Constipation   . Diverticulosis   . History of bladder infections   . Urinary incontinence     occasionally  . Other postablative hypothyroidism     takes SYnthroid daily  . Insomnia     takes Ativan nigtly    Past Surgical History  Procedure Laterality Date  . Knee surgery      arthroscopy-- R  . Tonsillectomy    . Appendectomy    . Colonoscopy  Sept 2008    RMR: normal rectum, left-sided diverticula, repeat in 2018  . Abdominal hysterectomy    . Upper gastrointestinal endoscopy    . Breast lumpectomy      left   . Eye surgery      cataracts removed- bilateral, /w IOL  . Lumpectomy on pelvis    . Diagnostic laparoscopy    . Tubal ligation    . Esophagogastroduodenoscopy    . Anterior  lat lumbar fusion  05/13/2012    Procedure: ANTERIOR LATERAL LUMBAR FUSION 2 LEVELS;  Surgeon: Faythe Ghee, MD;  Location: [place] [facility];  Service: Neurosurgery;  Laterality: Left;  Left Lumbar Three-four,Lumbar four-five Extreme Lumbar Interbody Fusion with Percutaneous Pedicle Screws  . Spinal fusion    . Knee arthroscopy with lateral menisectomy Right 10/06/2013    Procedure: KNEE ARTHROSCOPY WITH LATERAL AND MEDIAL MENISECTOMY;  Surgeon: Carole Civil, MD;  [facility];  Service: Orthopedics;  Laterality: Right;  END @ 1234  . Foreign body removal Right 10/06/2013    Procedure: REMOVAL FOREIGN BODY EXTREMITY;  Surgeon: Carole Civil, MD;  Location: [facility];  Service: Orthopedics;  Laterality: Right;  . Injection knee Left 10/06/2013    Procedure: KNEE INJECTION;  Surgeon: Carole Civil, MD;  Location: [facility];  Service: Orthopedics;  Laterality: Left;  . Total  knee arthroplasty Right 01/24/2014    Procedure: TOTAL KNEE ARTHROPLASTY;  Surgeon: Carole Civil, MD;  Location: [facility];  Service: Orthopedics;  Laterality: Right;  . Total knee arthroplasty Left 08/08/2014    Procedure: TOTAL KNEE ARTHROPLASTY;  Surgeon: Carole Civil, MD;  Location: [facility];  Service: Orthopedics;  Laterality: Left;    There were no vitals taken for this visit.  Visit Diagnosis:  Primary osteoarthritis of left knee  Postoperative stiffness of total knee replacement, initial encounter  Left knee pain  Pain in joint, lower leg, left  Difficulty walking  Pain in joint, lower leg, right  Abnormality of gait  Left leg weakness      Subjective Assessment - 10/08/14 1012    Symptoms Pt reprots very minimal pain, at 1/10 in the lt knee today.    Currently in Pain? Yes   Pain Score 1    Pain Location Knee                    [facility] Adult PT Treatment/Exercise - 10/08/14 0937    Exercises   Exercises Knee/Hip   Knee/Hip Exercises: Stretches   Active Hamstring Stretch 20 seconds;1 rep   Active Hamstring Stretch Limitations 14", 3 way   Hip Flexor Stretch 20 seconds;1 rep   Hip Flexor Stretch Limitations 14", 3 way   Gastroc Stretch 1 rep;20 seconds   Gastroc Stretch Limitations 3 way on slant board   Knee/Hip Exercises: Machines for Strengthening   Cybex Knee Extension (B) LE 2Pl x10   Cybex Knee Flexion (B) LE 3Pl x10   Knee/Hip Exercises: Standing   Lateral Step Up Hand Hold: 1;10 reps;Left   Lateral Step Up Limitations BOSU   Forward Step Up Hand Hold: 1;10 reps;Left   Forward Step Up Limitations BOSU   Step Down 10 reps;Left;Hand Hold: 1;Step Height: 2"   Step Down Limitations Heel Taps   SLS Single leg balance reach matrix common x6, with 1 fingertip assist   Other Standing Knee Exercises Sumo walk side/backward 1RT each, BTB                  PT Short Term Goals - 10/05/14 1652    PT SHORT TERM GOAL #1   Title  Patient will display increaseed knee flexion to 110 degrees to more easily squat to chair without weight shifting to Rt LE   PT SHORT TERM GOAL #2   Title Patient will be able to demosntrate 5x sit to stand in < 15 seconds indicating patient not at high risk for falls.  PT SHORT TERM GOAL #3   Title Patient will demonstrate increased ankle dorsiflexion of >10 degrees.    Status On-going   PT SHORT TERM GOAL #4   Title Patient will report walking >32minutes with tolerable pain.    Status On-going           PT Long Term Goals - 10/05/14 1653    PT LONG TERM GOAL #1   Title Patient will display increaseed knee flexion to 120 degrees to more easily squat to chair without weight shifting to Rt LE   PT LONG TERM GOAL #2   Status On-going   PT LONG TERM GOAL #3   Title Patient will report walking >66minutes to get groceries with pain <2/10   Status On-going               Plan - 10/08/14 1017    Clinical Impression Statement Progressed strengthening exercises for lower body, adding machines for knee flexion and extension though did require use of (B) LE to complete exercises.  VC with single leg balance exericse to progress down to 1 fingertip assist, with encouragement to avoid placing Rt foot on ground to work on balance on Lt LE.   Pt will benefit from skilled therapeutic intervention in order to improve on the following deficits Abnormal gait;Decreased strength;Pain;Difficulty walking;Decreased mobility;Decreased activity tolerance;Decreased balance;Decreased range of motion;Increased fascial restricitons;Improper body mechanics;Impaired flexibility;Decreased endurance   Rehab Potential Fair   PT Frequency 3x / week   PT Duration 6 weeks   PT Treatment/Interventions Therapeutic exercise;Balance training;Patient/family education;Gait training;Manual techniques;Stair training;Passive range of motion   PT Next Visit Plan Continue with functional strengthening increasing single leg  balance reach depth.  Update G-code/reassess in 2 visits.         Problem List Patient Active Problem List   Diagnosis Date Noted  . S/P total knee replacement 08/08/2014  . Difficulty in walking(719.7) 02/15/2014  . Postoperative stiffness of total knee replacement 02/15/2014  . Pain in joint, lower leg 02/15/2014  . Abnormality of gait 10/11/2013  . S/P arthroscopy of right knee 10/09/2013  . Radicular pain of right lower extremity 07/25/2013  . Knee instability 07/25/2013  . Right knee pain 07/25/2013  . Chest pain 04/25/2013  . Patellar tendonitis 09/07/2012  . OA (osteoarthritis) of knee 02/09/2012  . GERD (gastroesophageal reflux disease) 12/01/2011  . Dysphagia 12/01/2011  . RUQ pain 12/01/2011  . Fatty liver 12/01/2011  . Hematochezia 12/01/2011  . INTERMITTENT VERTIGO 09/12/2010  . KNEE, ARTHRITIS, DEGEN./OSTEO 04/03/2009  . CHEST PAIN, RECURRENT 03/07/2009  . OTHER DYSPHAGIA 03/07/2009  . PALPITATIONS 02/06/2009  . LIVER FUNCTION TESTS, ABNORMAL, HX OF 01/01/2009  . MORBID OBESITY 12/31/2008  . PUD 12/31/2008  . BACK PAIN 12/24/2008  . ANSERINE BURSITIS 12/24/2008  . BENIGN POSITIONAL VERTIGO 07/16/2008  . H N P-LUMBAR 04/02/2008  . SPINAL STENOSIS, CERVICAL 02/15/2008  . SPINAL STENOSIS, LUMBAR 02/15/2008  . Patellar tendinitis 10/19/2007  . TEAR LATERAL MENISCUS 06/13/2007  . OBESITY NOS 10/29/2006  . ALLERGIC RHINITIS, SEASONAL 10/29/2006  . HYPOTHYROIDISM 09/07/2006  . HYPERLIPIDEMIA 09/07/2006  . HYPERTENSION 09/07/2006  . GERD 09/07/2006  . DIVERTICULOSIS, COLON 09/07/2006  . OVERACTIVE BLADDER 09/07/2006  . FIBROCYSTIC BREAST DISEASE 09/07/2006  . OSTEOARTHRITIS 09/07/2006    Lonna Cobb, DPT [phone]   10/08/2014, 10:18 AM  [place], [place], [address] Phone: [phone]   Fax:  [phone]

## 2014-10-09 ENCOUNTER — Ambulatory Visit
Admission: RE | Admit: 2014-10-09 | Discharge: 2014-10-09 | Disposition: A | Discharge status: Home / Self Care | Payer: Medicare Other | Source: Ambulatory Visit | Attending: Family Medicine | Admitting: Family Medicine

## 2014-10-09 ENCOUNTER — Other Ambulatory Visit: Payer: Self-pay | Admitting: Surgery

## 2014-10-09 DIAGNOSIS — D0511 Intraductal carcinoma in situ of right breast: Secondary | ICD-10-CM | POA: Diagnosis not present

## 2014-10-09 DIAGNOSIS — C50911 Malignant neoplasm of unspecified site of right female breast: Secondary | ICD-10-CM

## 2014-10-09 DIAGNOSIS — R928 Other abnormal and inconclusive findings on diagnostic imaging of breast: Secondary | ICD-10-CM

## 2014-10-09 MED ORDER — GADOBENATE DIMEGLUMINE 529 MG/ML IV SOLN
20.0000 mL | Freq: Once | INTRAVENOUS | Status: AC | PRN
  Administered 2014-10-09: 20 mL via INTRAVENOUS

## 2014-10-10 ENCOUNTER — Ambulatory Visit (HOSPITAL_COMMUNITY)
Admission: RE | Admit: 2014-10-10 | Discharge: 2014-10-10 | Disposition: A | Discharge status: Home / Self Care | Payer: Medicare Other | Source: Ambulatory Visit | Attending: Orthopedic Surgery | Admitting: Orthopedic Surgery

## 2014-10-10 DIAGNOSIS — R262 Difficulty in walking, not elsewhere classified: Secondary | ICD-10-CM

## 2014-10-10 DIAGNOSIS — Z96659 Presence of unspecified artificial knee joint: Secondary | ICD-10-CM

## 2014-10-10 DIAGNOSIS — Z96652 Presence of left artificial knee joint: Secondary | ICD-10-CM | POA: Diagnosis not present

## 2014-10-10 DIAGNOSIS — R29898 Other symptoms and signs involving the musculoskeletal system: Secondary | ICD-10-CM

## 2014-10-10 DIAGNOSIS — M1712 Unilateral primary osteoarthritis, left knee: Secondary | ICD-10-CM

## 2014-10-10 DIAGNOSIS — R269 Unspecified abnormalities of gait and mobility: Secondary | ICD-10-CM

## 2014-10-10 DIAGNOSIS — M25562 Pain in left knee: Secondary | ICD-10-CM

## 2014-10-10 DIAGNOSIS — M25669 Stiffness of unspecified knee, not elsewhere classified: Secondary | ICD-10-CM

## 2014-10-10 DIAGNOSIS — Z471 Aftercare following joint replacement surgery: Secondary | ICD-10-CM | POA: Diagnosis not present

## 2014-10-10 DIAGNOSIS — M25662 Stiffness of left knee, not elsewhere classified: Secondary | ICD-10-CM | POA: Diagnosis not present

## 2014-10-10 DIAGNOSIS — T8489XA Other specified complication of internal orthopedic prosthetic devices, implants and grafts, initial encounter: Secondary | ICD-10-CM

## 2014-10-10 NOTE — Therapy (Signed)
[place], [place], [address] Phone: [phone]   Fax:  [phone]  Physical Therapy Treatment  Patient Details  Name: Sara [name] MRN: [id number] Date of Birth: Mar 03, 1946 Referring Provider:  Carole Civil, MD  Encounter Date: 10/10/2014      PT End of Session - 10/10/14 1552    Visit Number 17   Number of Visits 18   Date for PT Re-Evaluation 10/24/14   Authorization Type [employer] Medicare   Authorization Time Period Gcode complete 8th visit   Authorization - Visit Number 17   Authorization - Number of Visits 18   PT Start Time 1518   PT Stop Time 1600   PT Time Calculation (min) 42 min   Activity Tolerance Patient tolerated treatment well   Behavior During Therapy [facility] for tasks assessed/performed      Past Medical History  Diagnosis Date  . Thyroid disease   . Chronic back pain   . Reflux   . Unspecified arthropathy, shoulder region   . Pure hypercholesterolemia     takes Pravastin daily  . Allergic rhinitis due to pollen   . Cervicalgia   . Diverticulosis of colon (without mention of hemorrhage)   . Morbid obesity   . Palpitations   . Peptic ulcer, unspecified site, unspecified as acute or chronic, without mention of hemorrhage, perforation, or obstruction   . Peripheral vertigo, unspecified     takes Meclizine prn  . Reflux esophagitis   . Spinal stenosis in cervical region   . 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 Personal history of tobacco use, presenting hazards to health   . Insomnia, unspecified   . Lumbago   . Right leg pain   . Shortness of breath   . H/O hiatal hernia   . HTN (hypertension)     takes Metoprolol and Lisinopril daily  . Hypertension   . Sleep apnea     slight but doesn't require a CPAP  . History of bronchitis     > 44yr ago  . Seasonal allergies     uses Flonase daily  .  Headache(784.0)     denies migraines since the 80's but has occ and takes Butalbital prn  . Cervical spondylosis without myelopathy     all over  . Degeneration of cervical intervertebral disc   . Primary localized osteoarthrosis, lower leg   . GERD (gastroesophageal reflux disease)     takes Omeprazole daily  . Constipation   . Diverticulosis   . History of bladder infections   . Urinary incontinence     occasionally  . Other postablative hypothyroidism     takes SYnthroid daily  . Insomnia     takes Ativan nigtly    Past Surgical History  Procedure Laterality Date  . Knee surgery      arthroscopy-- R  . Tonsillectomy    . Appendectomy    . Colonoscopy  Sept 2008    RMR: normal rectum, left-sided diverticula, repeat in 2018  . Abdominal hysterectomy    . Upper gastrointestinal endoscopy    . Breast lumpectomy      left   . Eye surgery      cataracts removed- bilateral, /w IOL  . Lumpectomy on pelvis    . Diagnostic laparoscopy    . Tubal ligation    . Esophagogastroduodenoscopy    . Anterior  lat lumbar fusion  05/13/2012    Procedure: ANTERIOR LATERAL LUMBAR FUSION 2 LEVELS;  Surgeon: Faythe Ghee, MD;  Location: [place] [facility];  Service: Neurosurgery;  Laterality: Left;  Left Lumbar Three-four,Lumbar four-five Extreme Lumbar Interbody Fusion with Percutaneous Pedicle Screws  . Spinal fusion    . Knee arthroscopy with lateral menisectomy Right 10/06/2013    Procedure: KNEE ARTHROSCOPY WITH LATERAL AND MEDIAL MENISECTOMY;  Surgeon: Carole Civil, MD;  Location: [facility];  Service: Orthopedics;  Laterality: Right;  END @ 1234  . Foreign body removal Right 10/06/2013    Procedure: REMOVAL FOREIGN BODY EXTREMITY;  Surgeon: Carole Civil, MD;  Location: [facility];  Service: Orthopedics;  Laterality: Right;  . Injection knee Left 10/06/2013    Procedure: KNEE INJECTION;  Surgeon: Carole Civil, MD;  Location: [facility];  Service: Orthopedics;  Laterality: Left;  . Total  knee arthroplasty Right 01/24/2014    Procedure: TOTAL KNEE ARTHROPLASTY;  Surgeon: Carole Civil, MD;  Location: [facility];  Service: Orthopedics;  Laterality: Right;  . Total knee arthroplasty Left 08/08/2014    Procedure: TOTAL KNEE ARTHROPLASTY;  Surgeon: Carole Civil, MD;  Location: [facility];  Service: Orthopedics;  Laterality: Left;    There were no vitals taken for this visit.  Visit Diagnosis:  Primary osteoarthritis of left knee  Postoperative stiffness of total knee replacement, initial encounter  Left knee pain  Pain in joint, lower leg, left  Difficulty walking  Abnormality of gait  Left leg weakness      Subjective Assessment - 10/10/14 1519    Symptoms Patient reports generalized soreness in Left knee today, not acute and pt states that it seems to be related to the cold                    [facility] Adult PT Treatment/Exercise - 10/10/14 0001    Knee/Hip Exercises: Stretches   Active Hamstring Stretch 3 reps;30 seconds   Active Hamstring Stretch Limitations 14", 3 way   Piriformis Stretch 3 reps;30 seconds   Piriformis Stretch Limitations seated at EOB   Gastroc Stretch 3 reps;30 seconds   Gastroc Stretch Limitations 3 way stretch   Knee/Hip Exercises: Standing   Heel Raises 2 sets;10 reps   Heel Raises Limitations first set on stable surface, second set on unstable surface   Forward Lunges 10 reps;Left   Forward Lunges Limitations 14 inch box   Functional Squat 2 sets;5 reps   Functional Squat Limitations Sit to stand with 5 count eccentric lowers   Stairs Ascent/descent of 4 eight inch steps x5; 4 repititions with railings, 1 without railing    Other Standing Knee Exercises Standing marches on unstable surface approx  20 with U and no UE support   Other Standing Knee Exercises Sumo walks with dark blue band 2x45ft                  PT Short Term Goals - 10/05/14 1652    PT SHORT TERM GOAL #1   Title Patient will display  increaseed knee flexion to 110 degrees to more easily squat to chair without weight shifting to Rt LE   PT SHORT TERM GOAL #2   Title Patient will be able to demosntrate 5x sit to stand in < 15 seconds indicating patient not at high risk for falls.    PT SHORT TERM GOAL #3   Title Patient will demonstrate increased ankle dorsiflexion of >10 degrees.  Status On-going   PT SHORT TERM GOAL #4   Title Patient will report walking >60minutes with tolerable pain.    Status On-going           PT Long Term Goals - 10/05/14 1653    PT LONG TERM GOAL #1   Title Patient will display increaseed knee flexion to 120 degrees to more easily squat to chair without weight shifting to Rt LE   PT LONG TERM GOAL #2   Status On-going   PT LONG TERM GOAL #3   Title Patient will report walking >80minutes to get groceries with pain <2/10   Status On-going               Plan - 10/10/14 1606    Clinical Impression Statement Performed functional strengthening and stretching exercises, also assessed stair climbing and performed functional strengthening tasks on unstable surfaces. Patient did c/o mild increase in L knee pain with exercises on ustable surfaces at this time. However patient was able to perform without U upper extremity support. Pt did c/o pain increase in L knee from 1/10 to 3/10, so active stretching performed after exercises to assist in muscle relaxation and pain reducation; after stretching pain did reduce to baseline of 1/10 L knee.         Problem List Patient Active Problem List   Diagnosis Date Noted  . S/P total knee replacement 08/08/2014  . Difficulty in walking(719.7) 02/15/2014  . Postoperative stiffness of total knee replacement 02/15/2014  . Pain in joint, lower leg 02/15/2014  . Abnormality of gait 10/11/2013  . S/P arthroscopy of right knee 10/09/2013  . Radicular pain of right lower extremity 07/25/2013  . Knee instability 07/25/2013  . Right knee pain  07/25/2013  . Chest pain 04/25/2013  . Patellar tendonitis 09/07/2012  . OA (osteoarthritis) of knee 02/09/2012  . GERD (gastroesophageal reflux disease) 12/01/2011  . Dysphagia 12/01/2011  . RUQ pain 12/01/2011  . Fatty liver 12/01/2011  . Hematochezia 12/01/2011  . INTERMITTENT VERTIGO 09/12/2010  . KNEE, ARTHRITIS, DEGEN./OSTEO 04/03/2009  . CHEST PAIN, RECURRENT 03/07/2009  . OTHER DYSPHAGIA 03/07/2009  . PALPITATIONS 02/06/2009  . LIVER FUNCTION TESTS, ABNORMAL, HX OF 01/01/2009  . MORBID OBESITY 12/31/2008  . PUD 12/31/2008  . BACK PAIN 12/24/2008  . ANSERINE BURSITIS 12/24/2008  . BENIGN POSITIONAL VERTIGO 07/16/2008  . H N P-LUMBAR 04/02/2008  . SPINAL STENOSIS, CERVICAL 02/15/2008  . SPINAL STENOSIS, LUMBAR 02/15/2008  . Patellar tendinitis 10/19/2007  . TEAR LATERAL MENISCUS 06/13/2007  . OBESITY NOS 10/29/2006  . ALLERGIC RHINITIS, SEASONAL 10/29/2006  . HYPOTHYROIDISM 09/07/2006  . HYPERLIPIDEMIA 09/07/2006  . HYPERTENSION 09/07/2006  . GERD 09/07/2006  . DIVERTICULOSIS, COLON 09/07/2006  . OVERACTIVE BLADDER 09/07/2006  . FIBROCYSTIC BREAST DISEASE 09/07/2006  . OSTEOARTHRITIS 09/07/2006    Hunt Oris PT, DPT 10/10/2014, 4:08 PM  [place] [address] [place], [place], [address] Phone: [phone]   Fax:  [phone]

## 2014-10-11 ENCOUNTER — Encounter: Payer: Self-pay | Admitting: *Deleted

## 2014-10-11 ENCOUNTER — Encounter: Payer: Self-pay | Admitting: Radiation Oncology

## 2014-10-11 ENCOUNTER — Ambulatory Visit
Admission: RE | Admit: 2014-10-11 | Discharge: 2014-10-11 | Disposition: A | Discharge status: Home / Self Care | Payer: Medicare Other | Source: Ambulatory Visit | Attending: Radiation Oncology | Admitting: Radiation Oncology

## 2014-10-11 DIAGNOSIS — C50419 Malignant neoplasm of upper-outer quadrant of unspecified female breast: Secondary | ICD-10-CM | POA: Insufficient documentation

## 2014-10-11 DIAGNOSIS — C50411 Malignant neoplasm of upper-outer quadrant of right female breast: Secondary | ICD-10-CM | POA: Diagnosis not present

## 2014-10-11 NOTE — Progress Notes (Signed)
Please see the Nurse Progress Note in the MD Initial Consult Encounter for this patient. 

## 2014-10-11 NOTE — Progress Notes (Signed)
Radiation Oncology         [phone]) [phone] ________________________________  Initial outpatient Consultation - Date: 10/11/2014   Name: Sara [name] MRN: [id number]   DOB: 1946/07/21  REFERRING PHYSICIAN: Erroll Luna, MD  DIAGNOSIS:    ICD-9-CM ICD-10-CM   1. Malignant neoplasm of upper outer quadrant of female breast, right 174.4 C50.411 Ambulatory referral to Social Work    STAGE: Malignant neoplasm of upper outer quadrant of female breast   Staging form: Breast, AJCC 7th Edition     Clinical: Stage 0 (Tis (DCIS), N0, M0) - Signed by [name], MD on 10/11/2014  HISTORY OF PRESENT ILLNESS::Sara [name] is a 69 y.o. female  Who had a screening mammogram. This showed an asymmetry in Sara right breast. Sara left breast was negative. Ultrasound of this area showed a irrgeular mass in Sara 12 o'clock location measuring 0.7 x 0.5 x 0.4 cm. Biopsy was performed and showed DCIS which was focal necrosis. ER and PR were positive. An MRI was performed earlier this week which showed an 8 x 12 x 8 cm area of abnormal linear clumped enhancement throughout Sara majority of Sara central and upper right breast. An ultrasound and MRI guided biopsies are scheduled. She has met with Dr. Brantley Stage.  She is still recovering from a second knee replacement and has also had recent back surgery. She is tearful today. She has recovered well from her surgery.  PREVIOUS RADIATION THERAPY: No  FAMILY HISTORY:  Family History  Problem Relation Age of Onset  . Colon cancer Neg Hx     SOCIAL HISTORY:  History  Substance Use Topics  . Smoking status: Former Smoker -- 1.00 packs/day for 27 years    Types: Cigarettes  . Smokeless tobacco: Former Systems developer    Quit date: 10/02/1985  . Alcohol Use: 0.0 oz/week    0 Not specified per week     Comment: occasional wine(red)    REVIEW OF SYSTEMS:  A 15 point review of systems is documented in Sara electronic medical record. This was obtained by Sara nursing staff.  However, I reviewed this with Sara [name] to discuss relevant findings and make appropriate changes.  Pertinent positives are included in Sara chart.   PHYSICAL EXAM:  Pleasant female. No distress. Sitting in wheelchair. Cannot transfer well. Biopsy site in upper outer quadrant toward nipple of right breast. Some bruising. Alert and oriented. Appropriate.   IMPRESSION: DCIS of Sara right breast  PLAN:I spoke to Sara [name] today regarding her diagnosis and options for treatment. We discussed Sara equivalence in terms of survival and local failure between mastectomy and breast conservation. We discussed Sara role of radiation and decreasing local failures in patients who undergo lumpectomy.We discussed Sara possible side effects including but not limited to asymptomatic rib, heart and lung damage, heart disease, skin redness, fatigue, permanent skin darkening, and chest wall swelling. We discussed increased complications that can occur with reconstruction after radiation. We discussed Sara process of simulation and Sara placement tattoos. We discussed Sara low likelihood of secondary malignancies.    We discussed that her MRI findings may be benign or malignant and if they were DCIS or malignant she would likely require a mastectomy as her tumor size to breast ratio would leave an undesirable cosmetic outcome. We discussed that she could have reconstruction if she desired. We discussed if Sara biopsies were negative, she could have lumpectomy and radiation. We discussed she could have radiation at [facility] if she desired.   She  has a strong faith but is overwhelmed. She met with social work and our breast cancer navigator. She was given a copy of Sara Journey and support was provided.   We discussed referral to medical oncology. Perhaps Dr. Whitney Muse who is located closer to her home in [place] after she has had her surgery.   I spent 60 minutes  face to face with Sara [name] and more than 50% of that time was  spent in counseling and/or coordination of care.   ------------------------------------------------  [name], MD

## 2014-10-11 NOTE — Addendum Note (Signed)
Encounter addended by: Arlyss Repress, RN on: 10/11/2014 11:49 AM<BR>     Documentation filed: Charges VN

## 2014-10-11 NOTE — Progress Notes (Signed)
[facility] Psychosocial Distress Screening Clinical Social Work  Clinical Social Work was referred by distress screening protocol.  The patient scored a 10 on the Psychosocial Distress Thermometer which indicates severe distress. Clinical Social Worker met with pt after her radonc appt to assess for distress and other psychosocial needs. CSW met with pt and her husband and fully explained role of CSW, Pt and Family Support Team, Proofreader and support programs. Pt shared she has been felling very anxious as she is waiting for more answers and as her treatment plan evolves. She feels a little less stressed after getting some answers today. CSW attempted to normalize her feelings of anxiety due to current situation. She denies past concerns with anxiety or depression. She is very interested in support programs and [employer]. She agrees to referral for [employer], which CSW submitted on her behalf. She has found PT after her knee replacement very helpful and plans to continue. She reports to have great support from her husband, but has decided to not include her children at this time. She feels they would be overly stressed which would create more anxiety for her. Pt aware CSW available to process how to inform them if she needs assistance with this. Pt aware and understands how to reach CSW and Pt and Family Support team members as needed.   ONCBCN DISTRESS SCREENING 10/11/2014  Screening Type Initial Screening  Distress experienced in past week (1-10) 10  Emotional problem type Depression;Nervousness/Anxiety;Isolation/feeling alone;Adjusting to illness  Spiritual/Religous concerns type Facing my mortality  Information Concerns Type Lack of info about diagnosis;Lack of info about treatment  Physical Problem type Sleep/insomnia;Loss of appetitie;Skin dry/itchy  Other most distress caused by diagnosis. may call [phone]    Clinical Social Worker follow up needed: Yes.    If yes, follow up plan: See  above Loren Racer, [place] Worker [place]  [facility] Phone: [phone] Fax: [phone]

## 2014-10-11 NOTE — Progress Notes (Signed)
Spoke with patient today after her XRT appointment.  Contact information given.  Encouraged her to call with any needs or concerns.  Will continue to follow.

## 2014-10-12 ENCOUNTER — Other Ambulatory Visit: Payer: Medicare Other

## 2014-10-12 ENCOUNTER — Ambulatory Visit (HOSPITAL_COMMUNITY): Payer: Medicare Other | Admitting: Physical Therapy

## 2014-10-15 ENCOUNTER — Ambulatory Visit (HOSPITAL_COMMUNITY)
Admission: RE | Admit: 2014-10-15 | Discharge: 2014-10-15 | Disposition: A | Discharge status: Home / Self Care | Payer: Medicare Other | Source: Ambulatory Visit | Attending: Orthopedic Surgery | Admitting: Orthopedic Surgery

## 2014-10-15 DIAGNOSIS — M25561 Pain in right knee: Secondary | ICD-10-CM

## 2014-10-15 DIAGNOSIS — M25669 Stiffness of unspecified knee, not elsewhere classified: Secondary | ICD-10-CM

## 2014-10-15 DIAGNOSIS — R262 Difficulty in walking, not elsewhere classified: Secondary | ICD-10-CM

## 2014-10-15 DIAGNOSIS — Z471 Aftercare following joint replacement surgery: Secondary | ICD-10-CM | POA: Diagnosis not present

## 2014-10-15 DIAGNOSIS — M1712 Unilateral primary osteoarthritis, left knee: Secondary | ICD-10-CM

## 2014-10-15 DIAGNOSIS — R269 Unspecified abnormalities of gait and mobility: Secondary | ICD-10-CM

## 2014-10-15 DIAGNOSIS — R29898 Other symptoms and signs involving the musculoskeletal system: Secondary | ICD-10-CM

## 2014-10-15 DIAGNOSIS — M25562 Pain in left knee: Secondary | ICD-10-CM

## 2014-10-15 DIAGNOSIS — Z96659 Presence of unspecified artificial knee joint: Secondary | ICD-10-CM

## 2014-10-15 DIAGNOSIS — M25662 Stiffness of left knee, not elsewhere classified: Secondary | ICD-10-CM | POA: Diagnosis not present

## 2014-10-15 DIAGNOSIS — Z96652 Presence of left artificial knee joint: Secondary | ICD-10-CM | POA: Diagnosis not present

## 2014-10-15 DIAGNOSIS — T8489XA Other specified complication of internal orthopedic prosthetic devices, implants and grafts, initial encounter: Secondary | ICD-10-CM

## 2014-10-15 NOTE — Therapy (Signed)
[place], [place], [address] Phone: [phone]   Fax:  [phone]  Physical Therapy Treatment  Patient Details  Name: Sara [name] MRN: [id number] Date of Birth: November 23, 1945 Referring Provider:  Carole Civil, MD  Encounter Date: 10/15/2014      PT End of Session - 10/15/14 1057    Visit Number 18   Number of Visits 28   Date for PT Re-Evaluation 10/24/14   Authorization Type [employer] Medicare   Authorization Time Period Gcode updated visit #18   Authorization - Visit Number 18   Authorization - Number of Visits 28   PT Start Time 0932   PT Stop Time 1015   PT Time Calculation (min) 43 min   Activity Tolerance Patient limited by fatigue   Behavior During Therapy [facility] for tasks assessed/performed      Past Medical History  Diagnosis Date  . Thyroid disease   . Chronic back pain   . Reflux   . Unspecified arthropathy, shoulder region   . Pure hypercholesterolemia     takes Pravastin daily  . Allergic rhinitis due to pollen   . Cervicalgia   . Diverticulosis of colon (without mention of hemorrhage)   . Morbid obesity   . Palpitations   . Peptic ulcer, unspecified site, unspecified as acute or chronic, without mention of hemorrhage, perforation, or obstruction   . Peripheral vertigo, unspecified     takes Meclizine prn  . Reflux esophagitis   . Spinal stenosis in cervical region   . 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 Personal history of tobacco use, presenting hazards to health   . Insomnia, unspecified   . Lumbago   . Right leg pain   . Shortness of breath   . H/O hiatal hernia   . HTN (hypertension)     takes Metoprolol and Lisinopril daily  . Hypertension   . Sleep apnea     slight but doesn't require a CPAP  . History of bronchitis     > 64yr ago  . Seasonal allergies     uses Flonase daily  . Headache(784.0)     denies migraines since the 80's but has occ and takes Butalbital prn  . Cervical spondylosis without myelopathy     all over  . Degeneration of cervical intervertebral disc   . Primary localized osteoarthrosis, lower leg   . GERD (gastroesophageal reflux disease)     takes Omeprazole daily  . Constipation   . Diverticulosis   . History of bladder infections   . Urinary incontinence     occasionally  . Other postablative hypothyroidism     takes SYnthroid daily  . Insomnia     takes Ativan nigtly    Past Surgical History  Procedure Laterality Date  . Knee surgery      arthroscopy-- R  . Tonsillectomy    . Appendectomy    . Colonoscopy  Sept 2008    RMR: normal rectum, left-sided diverticula, repeat in 2018  . Abdominal hysterectomy    . Upper gastrointestinal endoscopy    . Breast lumpectomy      left   . Eye surgery      cataracts removed- bilateral, /w IOL  . Lumpectomy on pelvis    . Diagnostic laparoscopy    . Tubal ligation    . Esophagogastroduodenoscopy    . Anterior lat  lumbar fusion  05/13/2012    Procedure: ANTERIOR LATERAL LUMBAR FUSION 2 LEVELS;  Surgeon: Faythe Ghee, MD;  Location: [place];  Service: Neurosurgery;  Laterality: Left;  Left Lumbar Three-four,Lumbar four-five Extreme Lumbar Interbody Fusion with Percutaneous Pedicle Screws  . Spinal fusion    . Knee arthroscopy with lateral menisectomy Right 10/06/2013    Procedure: KNEE ARTHROSCOPY WITH LATERAL AND MEDIAL MENISECTOMY;  Surgeon: Carole Civil, MD;  [facility];  Service: Orthopedics;  Laterality: Right;  END @ 1234  . Foreign body removal Right 10/06/2013    Procedure: REMOVAL FOREIGN BODY EXTREMITY;  Surgeon: Carole Civil, MD;  Location: [facility];  Service: Orthopedics;  Laterality: Right;  . Injection knee Left 10/06/2013    Procedure: KNEE INJECTION;  Surgeon: Carole Civil, MD;  Location: [facility];  Service: Orthopedics;  Laterality: Left;  . Total knee arthroplasty  Right 01/24/2014    Procedure: TOTAL KNEE ARTHROPLASTY;  Surgeon: Carole Civil, MD;  Location: [facility];  Service: Orthopedics;  Laterality: Right;  . Total knee arthroplasty Left 08/08/2014    Procedure: TOTAL KNEE ARTHROPLASTY;  Surgeon: Carole Civil, MD;  Location: [facility];  Service: Orthopedics;  Laterality: Left;    There were no vitals taken for this visit.  Visit Diagnosis:  Primary osteoarthritis of left knee  Postoperative stiffness of total knee replacement, initial encounter  Left knee pain  Difficulty walking  Abnormality of gait  Left leg weakness  Pain in joint, lower leg, right      Subjective Assessment - 10/15/14 1105    Symptoms Pt reprots generalized fatigue today after going to see MD last week (CA discussion).  Pt reprots pending biopsy tomorrow, she is anticipating undergoing surgery in the beginning of Feb.    Currently in Pain? Yes   Pain Score 1    Pain Location Knee   Pain Orientation Left          [facility] PT Assessment - 10/15/14 0001    Assessment   Medical Diagnosis Lt TKA, lt knee stiffness, lt knee pain.    Onset Date 08/08/14   Observation/Other Assessments   Focus on Therapeutic Outcomes (FOTO)  34% Limited (was 51%)                  [facility] Adult PT Treatment/Exercise - 10/15/14 0001    Knee/Hip Exercises: Stretches   Active Hamstring Stretch 2 reps;20 seconds   Active Hamstring Stretch Limitations 17", 3 way   Knee: Self-Stretch to increase Flexion 3 reps;10 seconds   Knee: Self-Stretch Limitations 17" Step   Gastroc Stretch 2 reps;20 seconds   Gastroc Stretch Limitations 3 way stretch   Knee/Hip Exercises: Machines for Strengthening   Cybex Knee Extension Eccentric Lt LE 1Pl x10, (B) LE 2Pl x10   Cybex Knee Flexion Eccentric Lt LE 2Pl x10, (B) LE 3Pl x10   Knee/Hip Exercises: Standing   Side Lunges 10 reps;Both   Side Lunges Limitations Alternating Rt to Lt side   Lunge Walking - Round Trips 30 feet RT   Other  Standing Knee Exercises Sumo walks, BTB, 2x52ft                  PT Short Term Goals - 10/05/14 1652    PT SHORT TERM GOAL #1   Title Patient will display increaseed knee flexion to 110 degrees to more easily squat to chair without weight shifting to Rt LE   PT SHORT TERM GOAL #2  Title Patient will be able to demosntrate 5x sit to stand in < 15 seconds indicating patient not at high risk for falls.    PT SHORT TERM GOAL #3   Title Patient will demonstrate increased ankle dorsiflexion of >10 degrees.    Status On-going   PT SHORT TERM GOAL #4   Title Patient will report walking >25minutes with tolerable pain.    Status On-going           PT Long Term Goals - 10/05/14 1653    PT LONG TERM GOAL #1   Title Patient will display increaseed knee flexion to 120 degrees to more easily squat to chair without weight shifting to Rt LE   PT LONG TERM GOAL #2   Status On-going   PT LONG TERM GOAL #3   Title Patient will report walking >57minutes to get groceries with pain <2/10   Status On-going               Plan - 11/11/14 1102    Clinical Impression Statement Updated FOTO for updated Gcodes today, with pt continuing to report improving functional mobility skills.  Pain did not appear to be a limiting factor during treatment session today, though generalized fatigue was noted.  Pt has other health issues going on at this time, and is currently awaiting biopsy tomorrow and possible surgery in the beginning of February for CA treatment.  Pt appears to be progressing well with rehab for TKA, though activity tolerance is limited secondary to other health issues at this time.     Pt will benefit from skilled therapeutic intervention in order to improve on the following deficits Abnormal gait;Decreased strength;Pain;Difficulty walking;Decreased mobility;Decreased activity tolerance;Decreased balance;Decreased range of motion;Increased fascial restricitons;Improper body  mechanics;Impaired flexibility;Decreased endurance   Rehab Potential Fair   PT Frequency 3x / week   PT Duration 6 weeks   PT Treatment/Interventions Therapeutic exercise;Balance training;Patient/family education;Gait training;Manual techniques;Stair training;Passive range of motion   PT Next Visit Plan MD note and update certification next visit.  Recommend continuing PT for another month to address activity tolerance particularly and strength as pt prepares for surgical procedure.            G-Codes - 11-11-2014 1106    Functional Assessment Tool Used FOTO   Functional Limitation Mobility: Walking and moving around   Mobility: Walking and Moving Around Current Status [phone]) At least 20 percent but less than 40 percent impaired, limited or restricted   Mobility: Walking and Moving Around Goal Status [phone]) At least 1 percent but less than 20 percent impaired, limited or restricted      Problem List Patient Active Problem List   Diagnosis Date Noted  . Malignant neoplasm of upper outer quadrant of female breast 10/11/2014  . S/P total knee replacement 08/08/2014  . Difficulty in walking(719.7) 02/15/2014  . Postoperative stiffness of total knee replacement 02/15/2014  . Pain in joint, lower leg 02/15/2014  . Abnormality of gait 10/11/2013  . S/P arthroscopy of right knee 10/09/2013  . Radicular pain of right lower extremity 07/25/2013  . Knee instability 07/25/2013  . Right knee pain 07/25/2013  . Chest pain 04/25/2013  . Patellar tendonitis 09/07/2012  . OA (osteoarthritis) of knee 02/09/2012  . GERD (gastroesophageal reflux disease) 12/01/2011  . Dysphagia 12/01/2011  . RUQ pain 12/01/2011  . Fatty liver 12/01/2011  . Hematochezia 12/01/2011  . INTERMITTENT VERTIGO 09/12/2010  . KNEE, ARTHRITIS, DEGEN./OSTEO 04/03/2009  . CHEST PAIN, RECURRENT 03/07/2009  . OTHER  DYSPHAGIA 03/07/2009  . PALPITATIONS 02/06/2009  . LIVER FUNCTION TESTS, ABNORMAL, HX OF 01/01/2009  .  MORBID OBESITY 12/31/2008  . PUD 12/31/2008  . BACK PAIN 12/24/2008  . ANSERINE BURSITIS 12/24/2008  . BENIGN POSITIONAL VERTIGO 07/16/2008  . H N P-LUMBAR 04/02/2008  . SPINAL STENOSIS, CERVICAL 02/15/2008  . SPINAL STENOSIS, LUMBAR 02/15/2008  . Patellar tendinitis 10/19/2007  . TEAR LATERAL MENISCUS 06/13/2007  . OBESITY NOS 10/29/2006  . ALLERGIC RHINITIS, SEASONAL 10/29/2006  . HYPOTHYROIDISM 09/07/2006  . HYPERLIPIDEMIA 09/07/2006  . HYPERTENSION 09/07/2006  . GERD 09/07/2006  . DIVERTICULOSIS, COLON 09/07/2006  . OVERACTIVE BLADDER 09/07/2006  . FIBROCYSTIC BREAST DISEASE 09/07/2006  . OSTEOARTHRITIS 09/07/2006    Lonna Cobb, DPT [id number]   10/15/2014, 11:07 AM  [place], [place], [address] Phone: [phone]   Fax:  [phone]

## 2014-10-16 ENCOUNTER — Other Ambulatory Visit: Payer: Self-pay | Admitting: Surgery

## 2014-10-16 ENCOUNTER — Ambulatory Visit (HOSPITAL_COMMUNITY)
Admission: RE | Admit: 2014-10-16 | Discharge: 2014-10-16 | Disposition: A | Discharge status: Home / Self Care | Payer: Medicare Other | Source: Ambulatory Visit | Attending: Surgery | Admitting: Surgery

## 2014-10-16 ENCOUNTER — Inpatient Hospital Stay (HOSPITAL_COMMUNITY): Admission: RE | Admit: 2014-10-16 | Payer: Medicare Other | Source: Ambulatory Visit

## 2014-10-16 DIAGNOSIS — R928 Other abnormal and inconclusive findings on diagnostic imaging of breast: Secondary | ICD-10-CM

## 2014-10-16 DIAGNOSIS — R938 Abnormal findings on diagnostic imaging of other specified body structures: Secondary | ICD-10-CM | POA: Insufficient documentation

## 2014-10-16 DIAGNOSIS — D0511 Intraductal carcinoma in situ of right breast: Secondary | ICD-10-CM | POA: Insufficient documentation

## 2014-10-16 MED ORDER — LIDOCAINE HCL (PF) 2 % IJ SOLN
INTRAMUSCULAR | Status: AC
  Filled 2014-10-16: qty 10

## 2014-10-17 ENCOUNTER — Ambulatory Visit (HOSPITAL_COMMUNITY)
Admission: RE | Admit: 2014-10-17 | Discharge: 2014-10-17 | Disposition: A | Discharge status: Home / Self Care | Payer: Medicare Other | Source: Ambulatory Visit | Attending: Orthopedic Surgery | Admitting: Orthopedic Surgery

## 2014-10-17 ENCOUNTER — Ambulatory Visit
Admission: RE | Admit: 2014-10-17 | Discharge: 2014-10-17 | Disposition: A | Discharge status: Home / Self Care | Payer: Medicare Other | Source: Ambulatory Visit | Attending: Surgery | Admitting: Surgery

## 2014-10-17 DIAGNOSIS — T8489XA Other specified complication of internal orthopedic prosthetic devices, implants and grafts, initial encounter: Secondary | ICD-10-CM

## 2014-10-17 DIAGNOSIS — M25669 Stiffness of unspecified knee, not elsewhere classified: Secondary | ICD-10-CM

## 2014-10-17 DIAGNOSIS — Z96659 Presence of unspecified artificial knee joint: Secondary | ICD-10-CM

## 2014-10-17 DIAGNOSIS — D0511 Intraductal carcinoma in situ of right breast: Secondary | ICD-10-CM | POA: Diagnosis not present

## 2014-10-17 DIAGNOSIS — Z471 Aftercare following joint replacement surgery: Secondary | ICD-10-CM | POA: Diagnosis not present

## 2014-10-17 DIAGNOSIS — M25562 Pain in left knee: Secondary | ICD-10-CM

## 2014-10-17 DIAGNOSIS — R262 Difficulty in walking, not elsewhere classified: Secondary | ICD-10-CM | POA: Diagnosis not present

## 2014-10-17 DIAGNOSIS — M25561 Pain in right knee: Secondary | ICD-10-CM

## 2014-10-17 DIAGNOSIS — M1712 Unilateral primary osteoarthritis, left knee: Secondary | ICD-10-CM

## 2014-10-17 DIAGNOSIS — M25662 Stiffness of left knee, not elsewhere classified: Secondary | ICD-10-CM | POA: Diagnosis not present

## 2014-10-17 DIAGNOSIS — R269 Unspecified abnormalities of gait and mobility: Secondary | ICD-10-CM

## 2014-10-17 DIAGNOSIS — Z96652 Presence of left artificial knee joint: Secondary | ICD-10-CM | POA: Diagnosis not present

## 2014-10-17 DIAGNOSIS — R29898 Other symptoms and signs involving the musculoskeletal system: Secondary | ICD-10-CM

## 2014-10-17 DIAGNOSIS — R928 Other abnormal and inconclusive findings on diagnostic imaging of breast: Secondary | ICD-10-CM

## 2014-10-17 MED ORDER — GADOBENATE DIMEGLUMINE 529 MG/ML IV SOLN
20.0000 mL | Freq: Once | INTRAVENOUS | Status: AC | PRN
  Administered 2014-10-17: 20 mL via INTRAVENOUS

## 2014-10-17 NOTE — Therapy (Signed)
[place], [place], [address] Phone: [phone]   Fax:  [phone]  Physical Therapy Treatment  Patient Details  Name: Sara [name] MRN: [id number] Date of Birth: [birth year] Referring Provider:  Carole Civil, MD  Encounter Date: 10/17/2014      PT End of Session - 10/17/14 1628    Visit Number 19   Number of Visits 28   Authorization Type [employer] Medicare   Authorization Time Period Gcode updated visit #18   Authorization - Visit Number 19   Authorization - Number of Visits 28   PT Start Time 0300   PT Stop Time 1430   PT Time Calculation (min) 42 min   Activity Tolerance Patient limited by fatigue   Behavior During Therapy [facility] for tasks assessed/performed      Past Medical History  Diagnosis Date  . Thyroid disease   . Chronic back pain   . Reflux   . Unspecified arthropathy, shoulder region   . Pure hypercholesterolemia     takes Pravastin daily  . Allergic rhinitis due to pollen   . Cervicalgia   . Diverticulosis of colon (without mention of hemorrhage)   . Morbid obesity   . Palpitations   . Peptic ulcer, unspecified site, unspecified as acute or chronic, without mention of hemorrhage, perforation, or obstruction   . Peripheral vertigo, unspecified     takes Meclizine prn  . Reflux esophagitis   . Spinal stenosis in cervical region   . 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 Personal history of tobacco use, presenting hazards to health   . Insomnia, unspecified   . Lumbago   . Right leg pain   . Shortness of breath   . H/O hiatal hernia   . HTN (hypertension)     takes Metoprolol and Lisinopril daily  . Hypertension   . Sleep apnea     slight but doesn't require a CPAP  . History of bronchitis     > 65yr[age]  . Seasonal allergies     uses Flonase daily  . Headache(784.0)     denies migraines since the 80's but  has occ and takes Butalbital prn  . Cervical spondylosis without myelopathy     all over  . Degeneration of cervical intervertebral disc   . Primary localized osteoarthrosis, lower leg   . GERD (gastroesophageal reflux disease)     takes Omeprazole daily  . Constipation   . Diverticulosis   . History of bladder infections   . Urinary incontinence     occasionally  . Other postablative hypothyroidism     takes SYnthroid daily  . Insomnia     takes Ativan nigtly    Past Surgical History  Procedure Laterality Date  . Knee surgery      arthroscopy-- R  . Tonsillectomy    . Appendectomy    . Colonoscopy  Sept 2008    RMR: normal rectum, left-sided diverticula, repeat in 2018  . Abdominal hysterectomy    . Upper gastrointestinal endoscopy    . Breast lumpectomy      left   . Eye surgery      cataracts removed- bilateral, /w IOL  . Lumpectomy on pelvis    . Diagnostic laparoscopy    . Tubal ligation    . Esophagogastroduodenoscopy    . Anterior lat lumbar fusion  05/13/2012  Procedure: ANTERIOR LATERAL LUMBAR FUSION 2 LEVELS;  Surgeon: Faythe Ghee, MD;  Location: [place] [facility];  Service: Neurosurgery;  Laterality: Left;  Left Lumbar Three-four,Lumbar four-five Extreme Lumbar Interbody Fusion with Percutaneous Pedicle Screws  . Spinal fusion    . Knee arthroscopy with lateral menisectomy Right 10/06/2013    Procedure: KNEE ARTHROSCOPY WITH LATERAL AND MEDIAL MENISECTOMY;  Surgeon: Carole Civil, MD;  [facility];  Service: Orthopedics;  Laterality: Right;  END @ 1234  . Foreign body removal Right 10/06/2013    Procedure: REMOVAL FOREIGN BODY EXTREMITY;  Surgeon: Carole Civil, MD;  Location: [facility];  Service: Orthopedics;  Laterality: Right;  . Injection knee Left 10/06/2013    Procedure: KNEE INJECTION;  Surgeon: Carole Civil, MD;  Location: [facility];  Service: Orthopedics;  Laterality: Left;  . Total knee arthroplasty Right 01/24/2014    Procedure: TOTAL KNEE  ARTHROPLASTY;  Surgeon: Carole Civil, MD;  Location: [facility];  Service: Orthopedics;  Laterality: Right;  . Total knee arthroplasty Left 08/08/2014    Procedure: TOTAL KNEE ARTHROPLASTY;  Surgeon: Carole Civil, MD;  Location: [facility];  Service: Orthopedics;  Laterality: Left;    There were no vitals taken for this visit.  Visit Diagnosis:  Primary osteoarthritis of left knee  Postoperative stiffness of total knee replacement, initial encounter  Difficulty walking  Abnormality of gait  Left leg weakness  Pain in joint, lower leg, right  Pain in joint, lower leg, left  Right knee pain  Left knee pain      Subjective Assessment - 10/17/14 1437    Symptoms Pt stated increased pain in knee on lateral aspect today, not sure reason has been doing a little more around the house.  Rt breast is really sore, had biopsy earlier today.     How long can you stand comfortably? Able to stand comforably for 15-20 minutes (was <15 minutes_)   How long can you walk comfortably? Able to walk for 25 minutes (was 15-20 minutes)   Currently in Pain? Yes   Pain Score 4    Pain Location Knee   Pain Orientation Left   Pain Descriptors / Indicators Aching;Sore          [facility] PT Assessment - 10/17/14 1627    Assessment   Medical Diagnosis Lt TKA, lt knee stiffness, lt knee pain.    Onset Date 08/08/14   Next MD Visit Arther Abbott, 10/18/14   Prior Therapy home health 3 visits   AROM   Left Hip External Rotation  58  was 55   Left Hip Internal Rotation  60  was 58   Left Knee Extension 0   Left Knee Flexion 123  was 120   Left Ankle Dorsiflexion 10  was 7   Strength   Left Hip Flexion 4/5  was 4/5   Left Hip Extension 4/5  was 4/5   Left Hip ABduction 3+/5   Left Knee Flexion 5/5   Left Knee Extension 4+/5  was 4/5   Left Ankle Dorsiflexion 4+/5                  [facility] Adult PT Treatment/Exercise - 10/17/14 1500    Exercises   Exercises Knee/Hip    Knee/Hip Exercises: Stretches   Active Hamstring Stretch 3 reps;30 seconds   Active Hamstring Stretch Limitations 14", 3 way   Hip Flexor Stretch 20 seconds;1 rep   Hip Flexor Stretch Limitations 14", 3 way  Knee: Self-Stretch Limitations 17" Step 10 reps   Gastroc Stretch 3 reps;30 seconds   Gastroc Stretch Limitations 3 way stretch   Knee/Hip Exercises: Machines for Strengthening   Cybex Knee Extension 1 rep max Rt quad 27#, Lt 9#   Cybex Knee Flexion 1 rep max Rt 47.5#, Lt 31#   Knee/Hip Exercises: Standing   Heel Raises 20 reps   Step Down Both;5 reps;Hand Hold: 1;Step Height: 2"   Step Down Limitations single leg reach ability to reach Lt 23in compared to Rt 27in   Stairs Reciprocal pattern 1 HR 7in steps 5 RT                  PT Short Term Goals - 10/17/14 1442    PT SHORT TERM GOAL #1   Title Patient will display increaseed knee flexion to 110 degrees to more easily squat to chair without weight shifting to Rt LE   Status Achieved   PT SHORT TERM GOAL #2   Title Patient will be able to demosntrate 5x sit to stand in < 15 seconds indicating patient not at high risk for falls.    Baseline 6.32"   Status Achieved   PT SHORT TERM GOAL #3   Title Patient will demonstrate increased ankle dorsiflexion of >10 degrees.    Baseline 10 degrees on 10/17/2014   Status Achieved   PT SHORT TERM GOAL #4   Title Patient will report walking >23mnutes with tolerable pain.    Baseline 25 minutes on 10/17/2014 (was 15-20 09/12/2014)   Status On-going           PT Long Term Goals - 10/17/14 1443    PT LONG TERM GOAL #1   Title Patient will display increaseed knee flexion to 120 degrees to more easily squat to chair without weight shifting to Rt LE   Status Achieved   PT LONG TERM GOAL #2   Title Patient will display increased knee flexion and extension strength on Lt to 5/5 MMT to be able to go up and down stairs without UE support.    Status On-going   PT LONG TERM GOAL  #3   Title Patient will report walking >459mutes to get groceries with pain <2/10   Status On-going               Plan - 10/17/14 1629    Clinical Impression Statement Reassessment complete with the following findings:  Pt limited by pain and fatigue this session, pt with biopsy complete earlier today for Rt breast so c/o soreness and increased pain on knee.  Pt has met 3/4 STG and 1/3 LTGs.  ROM WNLand strength is progressing slowly.  Pt continues to be limited by fatigue with 15-20 minutes standing and ability to walk for 25 minutes.  Pt continues to demonstrate increased difficulty with stair training espcially descending, 4 in difference noted today in single leg reach from 2in step.  1 rep max complete with quad 33% compared to Rt knee and 65% on hamstring strength comparing Bil LE.    PT Next Visit Plan Recommend continuing PT for another month to address activity tolerance particularly and strength as pt prepares for surgical procedure.          Problem List Patient Active Problem List   Diagnosis Date Noted  . Malignant neoplasm of upper outer quadrant of female breast 10/11/2014  . S/P total knee replacement 08/08/2014  . Difficulty in walking(719.7) 02/15/2014  . Postoperative stiffness of total  knee replacement 02/15/2014  . Pain in joint, lower leg 02/15/2014  . Abnormality of gait 10/11/2013  . S/P arthroscopy of right knee 10/09/2013  . Radicular pain of right lower extremity 07/25/2013  . Knee instability 07/25/2013  . Right knee pain 07/25/2013  . Chest pain 04/25/2013  . Patellar tendonitis 09/07/2012  . OA (osteoarthritis) of knee 02/09/2012  . GERD (gastroesophageal reflux disease) 12/01/2011  . Dysphagia 12/01/2011  . RUQ pain 12/01/2011  . Fatty liver 12/01/2011  . Hematochezia 12/01/2011  . INTERMITTENT VERTIGO 09/12/2010  . KNEE, ARTHRITIS, DEGEN./OSTEO 04/03/2009  . CHEST PAIN, RECURRENT 03/07/2009  . OTHER DYSPHAGIA 03/07/2009  . PALPITATIONS  02/06/2009  . LIVER FUNCTION TESTS, ABNORMAL, HX OF 01/01/2009  . MORBID OBESITY 12/31/2008  . PUD 12/31/2008  . BACK PAIN 12/24/2008  . ANSERINE BURSITIS 12/24/2008  . BENIGN POSITIONAL VERTIGO 07/16/2008  . H N P-LUMBAR 04/02/2008  . SPINAL STENOSIS, CERVICAL 02/15/2008  . SPINAL STENOSIS, LUMBAR 02/15/2008  . Patellar tendinitis 10/19/2007  . TEAR LATERAL MENISCUS 06/13/2007  . OBESITY NOS 10/29/2006  . ALLERGIC RHINITIS, SEASONAL 10/29/2006  . HYPOTHYROIDISM 09/07/2006  . HYPERLIPIDEMIA 09/07/2006  . HYPERTENSION 09/07/2006  . GERD 09/07/2006  . DIVERTICULOSIS, COLON 09/07/2006  . OVERACTIVE BLADDER 09/07/2006  . FIBROCYSTIC BREAST DISEASE 09/07/2006  . OSTEOARTHRITIS 09/07/2006   Ihor Austin, [place]  Aldona Lento 10/17/2014, 4:38 PM   Devona Konig PT DPT [place] [address] [place], [place], [address] Phone: [phone]   Fax:  [phone]

## 2014-10-18 ENCOUNTER — Encounter: Payer: Self-pay | Admitting: Orthopedic Surgery

## 2014-10-18 ENCOUNTER — Ambulatory Visit (INDEPENDENT_AMBULATORY_CARE_PROVIDER_SITE_OTHER): Payer: 59 | Admitting: Orthopedic Surgery

## 2014-10-18 VITALS — BP 105/45 | Ht 66.0 in | Wt 217.0 lb

## 2014-10-18 DIAGNOSIS — M1712 Unilateral primary osteoarthritis, left knee: Secondary | ICD-10-CM

## 2014-10-18 DIAGNOSIS — Z96652 Presence of left artificial knee joint: Secondary | ICD-10-CM

## 2014-10-18 MED ORDER — OXYCODONE-ACETAMINOPHEN 7.5-325 MG PO TABS: 1.0000 | ORAL_TABLET | Freq: Four times a day (QID) | ORAL | Status: DC | PRN

## 2014-10-18 NOTE — Progress Notes (Signed)
Patient ID: Sara [name], female   DOB: 07-Jun-1946, 69 y.o.   MRN: [id number] Chief Complaint  Patient presents with  . Follow-up    follow up Left TKA, DOS 08/08/14    10 weeks post left total knee. Some days are good some days are bad. However she is doing very well no cane is necessary for ambulation she is functioning well her knee is stable in extension and flexion her arc of motion is 115  Her oxycodone will be decreased to 7.5 mg 1 every 6 when necessary pain #120 and we will decrease this to 5 mg on her next visit

## 2014-10-22 ENCOUNTER — Ambulatory Visit (INDEPENDENT_AMBULATORY_CARE_PROVIDER_SITE_OTHER): Payer: Self-pay | Admitting: Surgery

## 2014-10-22 ENCOUNTER — Telehealth: Payer: Self-pay | Admitting: *Deleted

## 2014-10-22 DIAGNOSIS — D0511 Intraductal carcinoma in situ of right breast: Secondary | ICD-10-CM

## 2014-10-22 NOTE — Telephone Encounter (Signed)
Received referral from [facility] to schedule the pt after surgery, however, the surgery has been cancelled.  Emailed Engineer, civil (consulting) at [employer] for her to advise me on how to proceed. I will await her response.

## 2014-10-22 NOTE — H&P (Signed)
Chennel B. Wyka 10/22/2014 9:21 AM Location: [facility] Patient #: [id number] DOB: 28-Jan-1946 Married / Language: English / Race: Black or African American Female History of Present Illness Marcello Moores A. Cashae Weich MD; 10/22/2014 10:38 AM) Patient words: discuss breast sx   pt here for follow up of DCIS right breast. MRI showed a much larger area and biopsy of posterior extent showed DCIS. Pt on for lumpectomy prior to MRI but will need mastectomy to cleear area. No complaints today.    ADDENDUM REPORT: 10/09/2014 15:12 ADDENDUM: The IMPRESSION should: 8 x 12 x 8 CM abnormal linear clumped enhancement throughout the majority of the central and upper right breast. Electronically Signed By: Hassan Rowan M.D. On: 10/09/2014 15:12   Study Result CLINICAL DATA: 69 year old female with newly diagnosed right breast DCIS. LABS: BUN and creatinine were obtained on site at [facility] at [address] Results: BUN 0.8 mg/dL, Creatinine 8 mg/dL. EXAM: BILATERAL BREAST MRI WITH AND WITHOUT CONTRAST TECHNIQUE: Multiplanar, multisequence MR images of both breasts were obtained prior to and following the intravenous administration of 77ml of MultiHance. THREE-DIMENSIONAL MR IMAGE RENDERING ON INDEPENDENT WORKSTATION: Three-dimensional MR images were rendered by post-processing of the original MR data on an independent workstation. The three-dimensional MR images were interpreted, and findings are reported in the following complete MRI report for this study. Three dimensional images were evaluated at the independent DynaCad workstation COMPARISON: Previous mammograms and ultrasounds. FINDINGS: Breast composition: c: Heterogeneous fibroglandular tissue Background parenchymal enhancement: Mild Right breast: There is abnormal linear and clumped enhancement (rapid washout and plateau type kinetics) throughout the majority of the central and upper right breast  extending from just underneath the skin in the upper right breast to the posterior third. The entire area of abnormal enhancement measures 8 x 12 x 8 cm (transverse x AP x CC). Biopsy clip artifact within the anterior upper aspect of this abnormal enhancement is noted compatible with biopsy-proven DCIS in this area. Left breast: No mass or abnormal enhancement. Lymph nodes: A prominent 2.5 cm level 1 right axillary lymph node is identified. Ancillary findings: None. IMPRESSION: 8 x 12 x 8 mm abnormal linear and clumped enhancement throughout the majority of the central and upper right breast, with biopsy clip artifact in the anterior aspect of this enhancement. This entire area is suspicious for DCIS and recommend tissue sampling of the posterior extent if breast conservation will be considered. Prominent level 1 right axillary lymph node. Consider second-look ultrasound and biopsy. No MR evidence of left breast malignancy. RECOMMENDATION: MR guided biopsy of the posterior extent of abnormal right breast enhancement if breast soft conservation is considered. Consider second local ultrasound of the right axilla and possible biopsy. BI-RADS CATEGORY 5: Highly suggestive of malignancy. Electronically Signed: By: Hassan Rowan M.D. On: 10/09/2014 12:11      Diagnosis Breast, right, needle core biopsy, central - DUCTAL CARCINOMA IN SITU, MIXED PATTERNS (CRIBRIFORM, COMEDO AND PAPILLARY), INVOLVING MULTIPLE CORES, AT LEAST 9 MM IN MAXIMAL EXTENT IN ANY ONE CORE. - FOCAL LOBULAR NEOPLASIA (ALH/LCIS). - NO INVASIVE CARCINOMA IDENTIFIED. - ER AND PR HAVE BEEN PERFORMED ON A RECENT NEEDLE CORE BIOPSY WITH DUCTAL CARCINOMA IN SITU ([id number], 09-26-2014). - SEE COMMENT. ADDITIONAL INFORMATION: PROGNOSTIC INDICATORS - ACIS Results: IMMUNOHISTOCHEMICAL AND MORPHOMETRIC ANALYSIS BY THE AUTOMATED CELLULAR IMAGING SYSTEM (ACIS) Estrogen Receptor: 100%, POSITIVE, STRONG STAINING  INTENSITY Progesterone Receptor: 90%, POSITIVE, STRONG STAINING INTENSITY REFERENCE RANGE ESTROGEN RECEPTOR NEGATIVE <1% POSITIVE =>1% PROGESTERONE RECEPTOR NEGATIVE <1% POSITIVE =>1% All controls stained  appropriately Enid Cutter MD Pathologist, Electronic Signature ( Signed 09/28/2014) FINAL DIAGNOSIS Diagnosis Breast, right, needle core biopsy, 11-12 o'clock, 2 cm from nipple - 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 Microscopic Comment There is intermediate grade ductal carcinoma in situ with focal necrosis. Estrogen and progesterone will be performed. 1 of 2 FINAL for ANI, DEOLIVEIRA ([id number]) Microscopic Comment(continued) Drm nipple Specimen Clinical Information 7 mm.  The patient is a 69 year old female   Allergies Marjean Donna, CMA; 10/22/2014 9:22 AM) No Known Drug Allergies 10/01/2014  Medication History (Sonya Bynum, CMA; 10/22/2014 9:23 AM) DiphenhydrAMINE HCl (25MG  Capsule, Oral) Active. Flonase Allergy Relief (50MCG/ACT Suspension, Nasal) Active. Gabapentin (600MG  Tablet, Oral) Active. Levothyroxine Sodium (88MCG Tablet, Oral) Active. LORazepam (1MG  Tablet, Oral) Active. Losartan Potassium-HCTZ (100-12.5MG  Tablet, Oral) Active. Metoprolol Tartrate (25MG  Tablet, Oral) Active. Omeprazole (40MG  Capsule DR, Oral) Active. Oxycodone-Acetaminophen (10-325MG  Tablet, Oral) Active. Pravastatin Sodium (40MG  Tablet, Oral) Active. Polyethylene Glycol 300 (as needed) Active. Oxycodone-Acetaminophen (7.5-325MG  Tablet, Oral as needed) Active.    Vitals (Sonya Bynum CMA; 10/22/2014 9:22 AM) 10/22/2014 9:21 AM Weight: 216 lb Height: 66in Body Surface Area: 2.14 m Body Mass Index: 34.86 kg/m Temp.: 97.74F(Temporal)  Pulse: 66 (Regular)  BP: 118/80 (Sitting, Left Arm, Standard)     Physical Exam (Latwan Luchsinger A. Ayaana Biondo MD; 10/22/2014 10:39 AM)  General Mental Status-Alert. General Appearance-Consistent with stated  age. Hydration-Well hydrated. Voice-Normal.  Head and Neck Head-normocephalic, atraumatic with no lesions or palpable masses. Trachea-midline. Thyroid Gland Characteristics - normal size and consistency.  Eye Eyeball - Bilateral-Extraocular movements intact. Sclera/Conjunctiva - Bilateral-No scleral icterus.  Chest and Lung Exam Chest and lung exam reveals -quiet, even and easy respiratory effort with no use of accessory muscles and on auscultation, normal breath sounds, no adventitious sounds and normal vocal resonance. Inspection Chest Wall - Normal. Back - normal.  Breast Note: not re examined today   Cardiovascular Cardiovascular examination reveals -normal heart sounds, regular rate and rhythm with no murmurs and normal pedal pulses bilaterally.  Musculoskeletal Normal Exam - Left-Upper Extremity Strength Normal and Lower Extremity Strength Normal. Normal Exam - Right-Upper Extremity Strength Normal and Lower Extremity Strength Normal.  Lymphatic Note: not re examoned today     Assessment & Plan (Danel Requena A. Wymon Swaney MD; 10/22/2014 10:40 AM)  DCIS (DUCTAL CARCINOMA IN SITU), RIGHT (233.0  D05.11) Impression: MRI shows area to be 12 cm in diameter and second deeper biopsy shows DCIS. Recommend right simple mastectomy and SLN mapping with reconstruction. Pt will see plastic surgery. Will cancel lumpetomy and SLN. Discussed with scheduling today. Discussed treatment options for breast cancer to include breast conservation vs mastectomy with reconstruction. Pt has decided on mastectomy. Risk include bleeding, infection, flap necrosis, pain, numbness, recurrence, hematoma, other surgery needs. Pt understands and agrees to proceed. Risk of sentinel lymph node mapping include bleeding, infection, lymphedema, shoulder pain. stiffness, dye allergy. cosmetic deformity , blood clots, death, need for more surgery. Pt agres to proceed.  Current Plans Pt Education  - [facility] Mastectomy HCI

## 2014-10-23 ENCOUNTER — Ambulatory Visit (HOSPITAL_COMMUNITY)
Admission: RE | Admit: 2014-10-23 | Discharge: 2014-10-23 | Disposition: A | Discharge status: Home / Self Care | Payer: Medicare Other | Source: Ambulatory Visit | Attending: Orthopedic Surgery | Admitting: Orthopedic Surgery

## 2014-10-23 DIAGNOSIS — R262 Difficulty in walking, not elsewhere classified: Secondary | ICD-10-CM | POA: Diagnosis not present

## 2014-10-23 DIAGNOSIS — M1712 Unilateral primary osteoarthritis, left knee: Secondary | ICD-10-CM

## 2014-10-23 DIAGNOSIS — M25562 Pain in left knee: Secondary | ICD-10-CM | POA: Diagnosis not present

## 2014-10-23 DIAGNOSIS — Z471 Aftercare following joint replacement surgery: Secondary | ICD-10-CM | POA: Insufficient documentation

## 2014-10-23 DIAGNOSIS — R29898 Other symptoms and signs involving the musculoskeletal system: Secondary | ICD-10-CM

## 2014-10-23 DIAGNOSIS — Z96652 Presence of left artificial knee joint: Secondary | ICD-10-CM | POA: Diagnosis not present

## 2014-10-23 DIAGNOSIS — T8489XA Other specified complication of internal orthopedic prosthetic devices, implants and grafts, initial encounter: Secondary | ICD-10-CM

## 2014-10-23 DIAGNOSIS — M25669 Stiffness of unspecified knee, not elsewhere classified: Secondary | ICD-10-CM

## 2014-10-23 DIAGNOSIS — R269 Unspecified abnormalities of gait and mobility: Secondary | ICD-10-CM

## 2014-10-23 DIAGNOSIS — Z96659 Presence of unspecified artificial knee joint: Secondary | ICD-10-CM

## 2014-10-23 DIAGNOSIS — M25561 Pain in right knee: Secondary | ICD-10-CM

## 2014-10-23 DIAGNOSIS — M25662 Stiffness of left knee, not elsewhere classified: Secondary | ICD-10-CM | POA: Diagnosis not present

## 2014-10-23 NOTE — Therapy (Signed)
[facility] [place], [place], [address] Phone: [phone]   Fax:  [phone]  Physical Therapy Treatment  Patient Details  Name: Sara [name] MRN: [id number] Date of Birth: [birth year] Referring Provider:  Carole Civil, MD  Encounter Date: 10/23/2014      PT End of Session - 10/23/14 1518    Visit Number 20   Number of Visits 28   Date for PT Re-Evaluation 10/24/14   Authorization Type [employer] Medicare   Authorization Time Period Gcode updated visit #18   Authorization - Visit Number 20   Authorization - Number of Visits 28   PT Start Time 8315   PT Stop Time 1515   PT Time Calculation (min) 39 min   Activity Tolerance Patient limited by fatigue   Behavior During Therapy [facility] for tasks assessed/performed      Past Medical History  Diagnosis Date  . Thyroid disease   . Chronic back pain   . Reflux   . Unspecified arthropathy, shoulder region   . Pure hypercholesterolemia     takes Pravastin daily  . Allergic rhinitis due to pollen   . Cervicalgia   . Diverticulosis of colon (without mention of hemorrhage)   . Morbid obesity   . Palpitations   . Peptic ulcer, unspecified site, unspecified as acute or chronic, without mention of hemorrhage, perforation, or obstruction   . Peripheral vertigo, unspecified     takes Meclizine prn  . Reflux esophagitis   . Spinal stenosis in cervical region   . 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 Personal history of tobacco use, presenting hazards to health   . Insomnia, unspecified   . Lumbago   . Right leg pain   . Shortness of breath   . H/O hiatal hernia   . HTN (hypertension)     takes Metoprolol and Lisinopril daily  . Hypertension   . Sleep apnea     slight but doesn't require a CPAP  . History of bronchitis     > 22yr ago  . Seasonal allergies     uses Flonase daily  . Headache(784.0)    denies migraines since the 80's but has occ and takes Butalbital prn  . Cervical spondylosis without myelopathy     all over  . Degeneration of cervical intervertebral disc   . Primary localized osteoarthrosis, lower leg   . GERD (gastroesophageal reflux disease)     takes Omeprazole daily  . Constipation   . Diverticulosis   . History of bladder infections   . Urinary incontinence     occasionally  . Other postablative hypothyroidism     takes SYnthroid daily  . Insomnia     takes Ativan nigtly    Past Surgical History  Procedure Laterality Date  . Knee surgery      arthroscopy-- R  . Tonsillectomy    . Appendectomy    . Colonoscopy  Sept 2008    RMR: normal rectum, left-sided diverticula, repeat in 2018  . Abdominal hysterectomy    . Upper gastrointestinal endoscopy    . Breast lumpectomy      left   . Eye surgery      cataracts removed- bilateral, /w IOL  . Lumpectomy on pelvis    . Diagnostic laparoscopy    . Tubal ligation    . Esophagogastroduodenoscopy    . Anterior lat lumbar  fusion  05/13/2012    Procedure: ANTERIOR LATERAL LUMBAR FUSION 2 LEVELS;  Surgeon: Faythe Ghee, MD;  Location: [facility];  Service: Neurosurgery;  Laterality: Left;  Left Lumbar Three-four,Lumbar four-five Extreme Lumbar Interbody Fusion with Percutaneous Pedicle Screws  . Spinal fusion    . Knee arthroscopy with lateral menisectomy Right 10/06/2013    Procedure: KNEE ARTHROSCOPY WITH LATERAL AND MEDIAL MENISECTOMY;  Surgeon: Carole Civil, MD;  Location: [facility];  Service: Orthopedics;  Laterality: Right;  END @ 1234  . Foreign body removal Right 10/06/2013    Procedure: REMOVAL FOREIGN BODY EXTREMITY;  Surgeon: Carole Civil, MD;  Location: [facility];  Service: Orthopedics;  Laterality: Right;  . Injection knee Left 10/06/2013    Procedure: KNEE INJECTION;  Surgeon: Carole Civil, MD;  Location: [facility];  Service: Orthopedics;  Laterality: Left;  . Total knee arthroplasty  Right 01/24/2014    Procedure: TOTAL KNEE ARTHROPLASTY;  Surgeon: Carole Civil, MD;  Location: [facility];  Service: Orthopedics;  Laterality: Right;  . Total knee arthroplasty Left 08/08/2014    Procedure: TOTAL KNEE ARTHROPLASTY;  Surgeon: Carole Civil, MD;  Location: [facility];  Service: Orthopedics;  Laterality: Left;    There were no vitals taken for this visit.  Visit Diagnosis:  Primary osteoarthritis of left knee  Postoperative stiffness of total knee replacement, initial encounter  Difficulty walking  Abnormality of gait  Pain in joint, lower leg, right  Pain in joint, lower leg, left  Right knee pain  Left knee pain  Left leg weakness      Subjective Assessment - 10/23/14 1441    Symptoms Pain scale 2/10 Lt LE acheing.  Pt with apt. with plastic surgan apt on Thursday to discuss breast removal.     Currently in Pain? Yes   Pain Score 2    Pain Location Knee   Pain Orientation Left   Pain Descriptors / Indicators Aching                    [facility] Adult PT Treatment/Exercise - 10/23/14 1457    Exercises   Exercises Knee/Hip   Knee/Hip Exercises: Stretches   Active Hamstring Stretch 3 reps;30 seconds   Active Hamstring Stretch Limitations 14", 3 way   Knee: Self-Stretch Limitations 17" Step 10 reps   Gastroc Stretch 3 reps;30 seconds   Gastroc Stretch Limitations 3 way stretch   Knee/Hip Exercises: Aerobic   Stationary Coffey #4, Lv 3, 10'   Knee/Hip Exercises: Standing   Forward Lunges Both;10 reps   Forward Lunges Limitations common lunge matrix on floor   Step Down Both;5 reps;Hand Hold: 1;Step Height: 2"   Step Down Limitations single leg reach ability to reach Lt 23in compared to Rt 27in   Stairs Reciprocal pattern 1 HR 7in steps 5 RT                  PT Short Term Goals - 10/23/14 1635    PT SHORT TERM GOAL #1   Title Patient will display increaseed knee flexion to 110 degrees to more easily squat to chair  without weight shifting to Rt LE   Status Achieved   PT SHORT TERM GOAL #2   Title Patient will be able to demosntrate 5x sit to stand in < 15 seconds indicating patient not at high risk for falls.    Status Achieved   PT SHORT TERM GOAL #3   Title Patient will  demonstrate increased ankle dorsiflexion of >10 degrees.    Status Achieved   PT SHORT TERM GOAL #4   Title Patient will report walking >66minutes with tolerable pain.    Status On-going           PT Long Term Goals - 10/23/14 1636    PT LONG TERM GOAL #1   Title Patient will display increaseed knee flexion to 120 degrees to more easily squat to chair without weight shifting to Rt LE   Status Achieved   PT LONG TERM GOAL #2   Title Patient will display increased knee flexion and extension strength on Lt to 5/5 MMT to be able to go up and down stairs without UE support.    Status On-going   PT LONG TERM GOAL #3   Title Patient will report walking >29minutes to get groceries with pain <2/10   Status On-going               Plan - 10/23/14 1615    Clinical Impression Statement Began common lunge matrix on floor with UE to increased gluteal and hamstirng activatin.  Pt continues to have difficulty descending stairs due to weak eccentric quad control.  Pt limited by fatigue through session both physical and emotional after new information of surgical procedure for breast removal.     PT Next Visit Plan Recommend continuing PT for another month to address activity tolerance particularly and strength as pt prepares for surgical procedure.          Problem List Patient Active Problem List   Diagnosis Date Noted  . Malignant neoplasm of upper outer quadrant of female breast 10/11/2014  . S/P total knee replacement 08/08/2014  . Difficulty in walking(719.7) 02/15/2014  . Postoperative stiffness of total knee replacement 02/15/2014  . Pain in joint, lower leg 02/15/2014  . Abnormality of gait 10/11/2013  . S/P  arthroscopy of right knee 10/09/2013  . Radicular pain of right lower extremity 07/25/2013  . Knee instability 07/25/2013  . Right knee pain 07/25/2013  . Chest pain 04/25/2013  . Patellar tendonitis 09/07/2012  . OA (osteoarthritis) of knee 02/09/2012  . GERD (gastroesophageal reflux disease) 12/01/2011  . Dysphagia 12/01/2011  . RUQ pain 12/01/2011  . Fatty liver 12/01/2011  . Hematochezia 12/01/2011  . INTERMITTENT VERTIGO 09/12/2010  . KNEE, ARTHRITIS, DEGEN./OSTEO 04/03/2009  . CHEST PAIN, RECURRENT 03/07/2009  . OTHER DYSPHAGIA 03/07/2009  . PALPITATIONS 02/06/2009  . LIVER FUNCTION TESTS, ABNORMAL, HX OF 01/01/2009  . MORBID OBESITY 12/31/2008  . PUD 12/31/2008  . BACK PAIN 12/24/2008  . ANSERINE BURSITIS 12/24/2008  . BENIGN POSITIONAL VERTIGO 07/16/2008  . H N P-LUMBAR 04/02/2008  . SPINAL STENOSIS, CERVICAL 02/15/2008  . SPINAL STENOSIS, LUMBAR 02/15/2008  . Patellar tendinitis 10/19/2007  . TEAR LATERAL MENISCUS 06/13/2007  . OBESITY NOS 10/29/2006  . ALLERGIC RHINITIS, SEASONAL 10/29/2006  . HYPOTHYROIDISM 09/07/2006  . HYPERLIPIDEMIA 09/07/2006  . HYPERTENSION 09/07/2006  . GERD 09/07/2006  . DIVERTICULOSIS, COLON 09/07/2006  . OVERACTIVE BLADDER 09/07/2006  . FIBROCYSTIC BREAST DISEASE 09/07/2006  . OSTEOARTHRITIS 09/07/2006   Ihor Austin, [place]   Aldona Lento 10/23/2014, 4:37 PM  [facility] [address] [place], [place], [address] Phone: [phone]   Fax:  [phone]

## 2014-10-25 ENCOUNTER — Ambulatory Visit (HOSPITAL_BASED_OUTPATIENT_CLINIC_OR_DEPARTMENT_OTHER): Admission: RE | Admit: 2014-10-25 | Payer: Medicare Other | Source: Ambulatory Visit | Admitting: Surgery

## 2014-10-25 ENCOUNTER — Encounter (HOSPITAL_BASED_OUTPATIENT_CLINIC_OR_DEPARTMENT_OTHER): Admission: RE | Payer: Self-pay | Source: Ambulatory Visit

## 2014-10-25 ENCOUNTER — Other Ambulatory Visit (HOSPITAL_COMMUNITY): Payer: Medicare Other

## 2014-10-25 DIAGNOSIS — D0511 Intraductal carcinoma in situ of right breast: Secondary | ICD-10-CM | POA: Diagnosis not present

## 2014-10-25 SURGERY — BREAST LUMPECTOMY WITH RADIOACTIVE SEED AND SENTINEL LYMPH NODE BIOPSY
Anesthesia: General | Laterality: Right

## 2014-10-26 ENCOUNTER — Encounter: Payer: Self-pay | Admitting: *Deleted

## 2014-10-26 ENCOUNTER — Ambulatory Visit (HOSPITAL_COMMUNITY)
Admission: RE | Admit: 2014-10-26 | Discharge: 2014-10-26 | Disposition: A | Discharge status: Home / Self Care | Payer: Medicare Other | Source: Ambulatory Visit | Attending: Orthopedic Surgery | Admitting: Orthopedic Surgery

## 2014-10-26 DIAGNOSIS — R29898 Other symptoms and signs involving the musculoskeletal system: Secondary | ICD-10-CM

## 2014-10-26 DIAGNOSIS — Z471 Aftercare following joint replacement surgery: Secondary | ICD-10-CM | POA: Diagnosis not present

## 2014-10-26 DIAGNOSIS — T8489XA Other specified complication of internal orthopedic prosthetic devices, implants and grafts, initial encounter: Secondary | ICD-10-CM

## 2014-10-26 DIAGNOSIS — M25561 Pain in right knee: Secondary | ICD-10-CM

## 2014-10-26 DIAGNOSIS — M25562 Pain in left knee: Secondary | ICD-10-CM

## 2014-10-26 DIAGNOSIS — R269 Unspecified abnormalities of gait and mobility: Secondary | ICD-10-CM

## 2014-10-26 DIAGNOSIS — M25669 Stiffness of unspecified knee, not elsewhere classified: Secondary | ICD-10-CM

## 2014-10-26 DIAGNOSIS — R262 Difficulty in walking, not elsewhere classified: Secondary | ICD-10-CM

## 2014-10-26 DIAGNOSIS — Z96659 Presence of unspecified artificial knee joint: Secondary | ICD-10-CM

## 2014-10-26 DIAGNOSIS — Z96652 Presence of left artificial knee joint: Secondary | ICD-10-CM | POA: Diagnosis not present

## 2014-10-26 DIAGNOSIS — M1712 Unilateral primary osteoarthritis, left knee: Secondary | ICD-10-CM

## 2014-10-26 DIAGNOSIS — M25662 Stiffness of left knee, not elsewhere classified: Secondary | ICD-10-CM | POA: Diagnosis not present

## 2014-10-26 NOTE — Therapy (Signed)
[place], [place], [address] Phone: [phone]   Fax:  [phone]  Physical Therapy Treatment  Patient Details  Name: Sara [name] MRN: [id number] Date of Birth: March 07, 1946 Referring Provider:  Carole Civil, MD  Encounter Date: 10/26/2014      PT End of Session - 10/26/14 1530    Visit Number 21   Number of Visits 25   Date for PT Re-Evaluation 11/09/14   Authorization Type [employer]   Authorization Time Period Gcode updated visit #18   Authorization - Visit Number 21   Authorization - Number of Visits 25   PT Start Time 2297   PT Stop Time 1515   PT Time Calculation (min) 38 min   Activity Tolerance Patient tolerated treatment well;Patient limited by fatigue   Behavior During Therapy [facility] for tasks assessed/performed      Past Medical History  Diagnosis Date  . Thyroid disease   . Chronic back pain   . Reflux   . Unspecified arthropathy, shoulder region   . Pure hypercholesterolemia     takes Pravastin daily  . Allergic rhinitis due to pollen   . Cervicalgia   . Diverticulosis of colon (without mention of hemorrhage)   . Morbid obesity   . Palpitations   . Peptic ulcer, unspecified site, unspecified as acute or chronic, without mention of hemorrhage, perforation, or obstruction   . Peripheral vertigo, unspecified     takes Meclizine prn  . Reflux esophagitis   . Spinal stenosis in cervical region   . 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 Personal history of tobacco use, presenting hazards to health   . Insomnia, unspecified   . Lumbago   . Right leg pain   . Shortness of breath   . H/O hiatal hernia   . HTN (hypertension)     takes Metoprolol and Lisinopril daily  . Hypertension   . Sleep apnea     slight but doesn't require a CPAP  . History of bronchitis     > 35yr[age]  . Seasonal allergies     uses  Flonase daily  . Headache(784.0)     denies migraines since the 80's but has occ and takes Butalbital prn  . Cervical spondylosis without myelopathy     all over  . Degeneration of cervical intervertebral disc   . Primary localized osteoarthrosis, lower leg   . GERD (gastroesophageal reflux disease)     takes Omeprazole daily  . Constipation   . Diverticulosis   . History of bladder infections   . Urinary incontinence     occasionally  . Other postablative hypothyroidism     takes SYnthroid daily  . Insomnia     takes Ativan nigtly    Past Surgical History  Procedure Laterality Date  . Knee surgery      arthroscopy-- R  . Tonsillectomy    . Appendectomy    . Colonoscopy  Sept 2008    RMR: normal rectum, left-sided diverticula, repeat in 2018  . Abdominal hysterectomy    . Upper gastrointestinal endoscopy    . Breast lumpectomy      left   . Eye surgery      cataracts removed- bilateral, /w IOL  . Lumpectomy on pelvis    . Diagnostic laparoscopy    . Tubal ligation    . Esophagogastroduodenoscopy    .  Anterior lat lumbar fusion  05/13/2012    Procedure: ANTERIOR LATERAL LUMBAR FUSION 2 LEVELS;  Surgeon: Faythe Ghee, MD;  Location: [facility];  Service: Neurosurgery;  Laterality: Left;  Left Lumbar Three-four,Lumbar four-five Extreme Lumbar Interbody Fusion with Percutaneous Pedicle Screws  . Spinal fusion    . Knee arthroscopy with lateral menisectomy Right 10/06/2013    Procedure: KNEE ARTHROSCOPY WITH LATERAL AND MEDIAL MENISECTOMY;  Surgeon: Carole Civil, MD;  Location: [facility];  Service: Orthopedics;  Laterality: Right;  END @ 1234  . Foreign body removal Right 10/06/2013    Procedure: REMOVAL FOREIGN BODY EXTREMITY;  Surgeon: Carole Civil, MD;  Location: [facility];  Service: Orthopedics;  Laterality: Right;  . Injection knee Left 10/06/2013    Procedure: KNEE INJECTION;  Surgeon: Carole Civil, MD;  Location: [facility];  Service: Orthopedics;  Laterality:  Left;  . Total knee arthroplasty Right 01/24/2014    Procedure: TOTAL KNEE ARTHROPLASTY;  Surgeon: Carole Civil, MD;  Location: [facility];  Service: Orthopedics;  Laterality: Right;  . Total knee arthroplasty Left 08/08/2014    Procedure: TOTAL KNEE ARTHROPLASTY;  Surgeon: Carole Civil, MD;  Location: [facility];  Service: Orthopedics;  Laterality: Left;    There were no vitals taken for this visit.  Visit Diagnosis:  Primary osteoarthritis of left knee  Postoperative stiffness of total knee replacement, initial encounter  Difficulty walking  Abnormality of gait  Pain in joint, lower leg, right  Pain in joint, lower leg, left  Right knee pain  Left knee pain  Left leg weakness      Subjective Assessment - 10/26/14 1440    Symptoms Pain scale minimum today, 2/10 Lt knee.     How long can you stand comfortably? Able to stand comforably for 30 mintes (was 15-20)   How long can you walk comfortably? Able to walk for 30 minutes (was 25 minutes)   Currently in Pain? Yes   Pain Score 2    Pain Location Knee   Pain Orientation Left          [facility] PT Assessment - 10/26/14 1528    Assessment   Medical Diagnosis Lt TKA, lt knee stiffness, lt knee pain.    Onset Date 08/08/14   Next MD Visit Arther Abbott, April   Prior Therapy home health 3 visits   AROM   Left Hip External Rotation  --  was 58   Left Hip Internal Rotation  --  was 60   Left Knee Extension 0   Left Knee Flexion 124  was 125   Left Ankle Dorsiflexion 15  was 10   Strength   Left Hip Flexion 4+/5  was 4/5   Left Hip Extension 4+/5  was 4/5   Left Hip ABduction 4/5   Left Knee Flexion 4+/5   Left Knee Extension 4+/5   Left Ankle Dorsiflexion 4+/5            [facility] Adult PT Treatment/Exercise - 10/26/14 1528    Exercises   Exercises Knee/Hip   Knee/Hip Exercises: Stretches   Active Hamstring Stretch 3 reps;30 seconds   Active Hamstring Stretch Limitations 14", 3 way   Quad Stretch 3  reps;30 seconds   Quad Stretch Limitations prone with rope   Knee: Self-Stretch Limitations 14" Step 10 reps knee drives   Gastroc Stretch 3 reps;30 seconds   Gastroc Stretch Limitations 3 way stretch slant board   Knee/Hip Exercises: Standing   Forward  Lunges Both;5 reps   Forward Lunges Limitations common lunge matrix on floor   Functional Squat 5 sets   Functional Squat Limitations squat reqch matrix   Stairs Reciprocal pattern with 7in step, no HHA ascend, 1 HHA descending 5RT            PT Short Term Goals - 10/26/14 1501    PT SHORT TERM GOAL #1   Title Patient will display increaseed knee flexion to 110 degrees to more easily squat to chair without weight shifting to Rt LE   Status Achieved   PT SHORT TERM GOAL #2   Title Patient will be able to demosntrate 5x sit to stand in < 15 seconds indicating patient not at high risk for falls.    Status Achieved   PT SHORT TERM GOAL #3   Title Patient will demonstrate increased ankle dorsiflexion of >10 degrees.    Status Achieved   PT SHORT TERM GOAL #4   Title Patient will report walking >44mnutes with tolerable pain.    Baseline 40 minutes on 10/26/2014; 25 minutes on 10/17/2014 (was 15-20 09/12/2014)   Status Achieved           PT Long Term Goals - 10/26/14 1502    PT LONG TERM GOAL #1   Title Patient will display increaseed knee flexion to 120 degrees to more easily squat to chair without weight shifting to Rt LE   Status Achieved   PT LONG TERM GOAL #2   Title Patient will display increased knee flexion and extension strength on Lt to 5/5 MMT to be able to go up and down stairs without UE support.    Baseline Able to ascend and descend without UE support, noted weak eccen   Status Partially Met   PT LONG TERM GOAL #3   Title Patient will report walking >462mutes to get groceries with pain <2/10   Status On-going   PT LONG TERM GOAL #4   Title Pt able to lift 25# in proper form from floor with proper weight  loadining for full knee flexion with functional activities at home.   Baseline 70 degrees knee flexion with proper lifting   Status New               Plan - 10/26/14 1531    Clinical Impression Statement Reassessment complete with the followoing findings:  Pt independent with HEP and able to demonstrate/verbalize appropriate techniqus.  PT has met all STGs and 1/4 LTGs, strength is progressing well with all LE musculature.  Pt continues to be limited by fatgiue with activities and increased difficuty descending stairs.  Pt unable to demonstrate appropriate weight loading with proper lifting.  Discussion held with evaluating DPT and new goal added to improve proper lifting to improve functional tasks at home.     PT Next Visit Plan Recommend continue OPPT for 2 more weeks to imporove squat depth with proper lifting and LE functional strenghtening.  Resume Nustep next session for activity tolerance.          Problem List Patient Active Problem List   Diagnosis Date Noted  . Malignant neoplasm of upper outer quadrant of female breast 10/11/2014  . S/P total knee replacement 08/08/2014  . Difficulty in walking(719.7) 02/15/2014  . Postoperative stiffness of total knee replacement 02/15/2014  . Pain in joint, lower leg 02/15/2014  . Abnormality of gait 10/11/2013  . S/P arthroscopy of right knee 10/09/2013  . Radicular pain of right lower extremity 07/25/2013  .  Knee instability 07/25/2013  . Right knee pain 07/25/2013  . Chest pain 04/25/2013  . Patellar tendonitis 09/07/2012  . OA (osteoarthritis) of knee 02/09/2012  . GERD (gastroesophageal reflux disease) 12/01/2011  . Dysphagia 12/01/2011  . RUQ pain 12/01/2011  . Fatty liver 12/01/2011  . Hematochezia 12/01/2011  . INTERMITTENT VERTIGO 09/12/2010  . KNEE, ARTHRITIS, DEGEN./OSTEO 04/03/2009  . CHEST PAIN, RECURRENT 03/07/2009  . OTHER DYSPHAGIA 03/07/2009  . PALPITATIONS 02/06/2009  . LIVER FUNCTION TESTS, ABNORMAL, HX  OF 01/01/2009  . MORBID OBESITY 12/31/2008  . PUD 12/31/2008  . BACK PAIN 12/24/2008  . ANSERINE BURSITIS 12/24/2008  . BENIGN POSITIONAL VERTIGO 07/16/2008  . H N P-LUMBAR 04/02/2008  . SPINAL STENOSIS, CERVICAL 02/15/2008  . SPINAL STENOSIS, LUMBAR 02/15/2008  . Patellar tendinitis 10/19/2007  . TEAR LATERAL MENISCUS 06/13/2007  . OBESITY NOS 10/29/2006  . ALLERGIC RHINITIS, SEASONAL 10/29/2006  . HYPOTHYROIDISM 09/07/2006  . HYPERLIPIDEMIA 09/07/2006  . HYPERTENSION 09/07/2006  . GERD 09/07/2006  . DIVERTICULOSIS, COLON 09/07/2006  . OVERACTIVE BLADDER 09/07/2006  . FIBROCYSTIC BREAST DISEASE 09/07/2006  . OSTEOARTHRITIS 09/07/2006   Ihor Austin, [place]   Aldona Lento 10/26/2014, 3:41 PM  [place] [address] [place], [place], [address] Phone: [phone]   Fax:  [phone]     Devona Konig PT DPT [phone]

## 2014-10-29 ENCOUNTER — Encounter (HOSPITAL_COMMUNITY): Payer: 59 | Attending: Hematology & Oncology | Admitting: Hematology & Oncology

## 2014-10-29 ENCOUNTER — Ambulatory Visit (INDEPENDENT_AMBULATORY_CARE_PROVIDER_SITE_OTHER): Payer: Self-pay | Admitting: Surgery

## 2014-10-29 ENCOUNTER — Encounter (HOSPITAL_COMMUNITY): Payer: Self-pay | Admitting: Hematology & Oncology

## 2014-10-29 VITALS — BP 134/66 | HR 60 | Temp 97.4°F | Resp 20 | Ht 66.0 in | Wt 218.2 lb

## 2014-10-29 DIAGNOSIS — D0511 Intraductal carcinoma in situ of right breast: Secondary | ICD-10-CM | POA: Diagnosis not present

## 2014-10-29 DIAGNOSIS — Z170 Estrogen receptor positive status [ER+]: Secondary | ICD-10-CM | POA: Diagnosis not present

## 2014-10-29 DIAGNOSIS — C50411 Malignant neoplasm of upper-outer quadrant of right female breast: Secondary | ICD-10-CM

## 2014-10-29 NOTE — Patient Instructions (Signed)
[facility] Discharge Instructions  RECOMMENDATIONS MADE BY THE CONSULTANT AND ANY TEST RESULTS WILL BE SENT TO YOUR REFERRING PHYSICIAN.  SPECIAL INSTRUCTIONS/FOLLOW-UP:  Please call prior to your next visit with any problems or concerns. We will see you back again in 6 with labs and an office visit.    Thank you for choosing [facility] to provide your oncology and hematology care.  To afford each patient quality time with our providers, please arrive at least 15 minutes before your scheduled appointment time.  With your help, our goal is to use those 15 minutes to complete the necessary work-up to ensure our physicians have the information they need to help with your evaluation and healthcare recommendations.    Effective January 1st, 2014, we ask that you re-schedule your appointment with our physicians should you arrive 10 or more minutes late for your appointment.  We strive to give you quality time with our providers, and arriving late affects you and other patients whose appointments are after yours.    Again, thank you for choosing [facility].  Our hope is that these requests will decrease the amount of time that you wait before being seen by our physicians.       _____________________________________________________________  Should you have questions after your visit to [facility], please contact our office at [phone] between the hours of 8:30 a.m. and 5:00 p.m.  Voicemails left after 4:30 p.m. will not be returned until the following business day.  For prescription refill requests, have your pharmacy contact our office with your prescription refill request.

## 2014-10-29 NOTE — Progress Notes (Signed)
[facility] CONSULT NOTE  Patient Care Team: [name], MD as PCP - General (Family Medicine) Daneil Dolin, MD (Gastroenterology) Josue Hector, MD as Attending Physician (Cardiology)  CHIEF COMPLAINTS/PURPOSE OF CONSULTATION:  DCIS R breast, DUCTAL CARCINOMA IN SITU, MIXED PATTERNS (CRIBRIFORM, COMEDO AND PAPILLARY), INVOLVING MULTIPLE CORES, AT LEAST 9 MM IN MAXIMAL EXTENT IN ANY ONE CORE.  HISTORY OF PRESENTING ILLNESS Sara [name] is a 69 y.o. female who underwent  screening mammography. Asymmetry was reported in the right breast.. The left breast was negative. Ultrasound of this area showed a irrgeular mass in the 12 o'clock location measuring 0.7 x 0.5 x 0.4 cm. Biopsy was performed and showed DCIS with focal necrosis. ER and PR were positive.   A breast MRI showed an 8 x 12 x 8 cm area of abnormal linear clumped enhancement throughout the majority of the central and upper right breast. An ultrasound and MRI guided biopsies are scheduled. She has met with Dr. Brantley Stage to discuss surgical options. She is here today for additional discussion of DCIS therapy post surgery.  MEDICAL HISTORY:  Past Medical History  Diagnosis Date  . Pure hypercholesterolemia     takes Pravastin daily  . Allergic rhinitis due to pollen   . Cervicalgia   . Diverticulosis of colon (without mention of hemorrhage)   . Morbid obesity   . Palpitations   . Peptic ulcer, unspecified site, unspecified as acute or chronic, without mention of hemorrhage, perforation, or obstruction   . Reflux esophagitis   . 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 Insomnia, unspecified   . Shortness of breath   . H/O hiatal hernia   . HTN (hypertension)     takes Metoprolol and Lisinopril daily  . Hypertension   . Sleep apnea     slight but doesn't require a CPAP  . History of bronchitis     > 97yr ago  . Cervical spondylosis  without myelopathy     all over  . Degeneration of cervical intervertebral disc   . Primary localized osteoarthrosis, lower leg   . GERD (gastroesophageal reflux disease)     takes Omeprazole daily  . Constipation     takes Miralax daily  . History of bladder infections   . Urinary incontinence     occasionally  . Insomnia     takes Ativan nigtly  . Other postablative hypothyroidism     takes SYnthroid daily  . Headache(784.0)     denies migraines since the 80's but has occ and takes Butalbital prn  . Vertigo     hx of;no meds   . Joint pain   . Joint swelling     SURGICAL HISTORY: Past Surgical History  Procedure Laterality Date  . Knee surgery      arthroscopy-- R  . Tonsillectomy    . Appendectomy    . Colonoscopy  Sept 2008    RMR: normal rectum, left-sided diverticula, repeat in 2018  . Abdominal hysterectomy    . Upper gastrointestinal endoscopy    . Breast lumpectomy      left   . Eye surgery      cataracts removed- bilateral, /w IOL  . Lumpectomy on pelvis    . Diagnostic laparoscopy    . Tubal ligation    . Esophagogastroduodenoscopy    . Anterior lat lumbar fusion  05/13/2012    Procedure: ANTERIOR  LATERAL LUMBAR FUSION 2 LEVELS;  Surgeon: Faythe Ghee, MD;  Location: [facility];  Service: Neurosurgery;  Laterality: Left;  Left Lumbar Three-four,Lumbar four-five Extreme Lumbar Interbody Fusion with Percutaneous Pedicle Screws  . Spinal fusion    . Knee arthroscopy with lateral menisectomy Right 10/06/2013    Procedure: KNEE ARTHROSCOPY WITH LATERAL AND MEDIAL MENISECTOMY;  Surgeon: Carole Civil, MD;  Location: [facility];  Service: Orthopedics;  Laterality: Right;  END @ 1234  . Foreign body removal Right 10/06/2013    Procedure: REMOVAL FOREIGN BODY EXTREMITY;  Surgeon: Carole Civil, MD;  Location: [facility];  Service: Orthopedics;  Laterality: Right;  . Injection knee Left 10/06/2013    Procedure: KNEE INJECTION;  Surgeon: Carole Civil, MD;   Location: [facility];  Service: Orthopedics;  Laterality: Left;  . Total knee arthroplasty Right 01/24/2014    Procedure: TOTAL KNEE ARTHROPLASTY;  Surgeon: Carole Civil, MD;  Location: [facility];  Service: Orthopedics;  Laterality: Right;  . Total knee arthroplasty Left 08/08/2014    Procedure: TOTAL KNEE ARTHROPLASTY;  Surgeon: Carole Civil, MD;  Location: [facility];  Service: Orthopedics;  Laterality: Left;    SOCIAL HISTORY: History   Social History  . Marital Status: Married    Spouse Name: N/A  . Number of Children: N/A  . Years of Education: N/A   Occupational History  . disability     since 2008, knee surgery   Social History Main Topics  . Smoking status: Former Smoker -- 1.00 packs/day for 27 years    Types: Cigarettes  . Smokeless tobacco: Former Systems developer    Quit date: 10/02/1985  . Alcohol Use: 0.0 oz/week    0 Standard drinks or equivalent per week     Comment: occasional wine  . Drug Use: No  . Sexual Activity: Yes    Birth Control/ Protection: Surgical   Other Topics Concern  . Not on file   Social History Narrative   Currently married for 10 years. Husband's name is Milbert Coulter. She is an excellent singer. 8 children between she and her husband. 23 grand children. 12 great grandchildren.    FAMILY HISTORY: Family History  Problem Relation Age of Onset  . Colon cancer Neg Hx     Mother died at 66 from blood clot, MI  . Heart attack      Father died in 51's from old age  . Heart attack      2 Brothers, 3 sisters  . Stroke Sister   . Osteoarthritis Sister   . Osteoarthritis Brother   . Obesity Brother   . Heart attack Sister    has no family status information on file.   ALLERGIES:  is allergic to propoxyphene hcl; camphor; dust mite extract; latex; molds & smuts; and tomato.  MEDICATIONS:  Current Outpatient Prescriptions  Medication Sig Dispense Refill  . diphenhydrAMINE (SOMINEX) 25 MG tablet Take 50 mg by mouth at bedtime as needed for allergies or  sleep.    . fluticasone (FLONASE) 50 MCG/ACT nasal spray Place 2 sprays into the nose at bedtime.     . gabapentin (NEURONTIN) 600 MG tablet 1/2 tab during the day and one tab at bedtime 60 tablet 5  . levothyroxine (SYNTHROID, LEVOTHROID) 88 MCG tablet Take 88 mcg by mouth daily before breakfast.     . LORazepam (ATIVAN) 1 MG tablet Take 1 mg by mouth at bedtime as needed for sleep. For sleep    . losartan-hydrochlorothiazide (HYZAAR)  100-12.5 MG per tablet Take 1 tablet by mouth daily.     . metoprolol tartrate (LOPRESSOR) 25 MG tablet Take 25 mg by mouth 2 (two) times daily.     Marland Kitchen omeprazole (PRILOSEC) 40 MG capsule Take 40 mg by mouth daily before breakfast.     . oxyCODONE-acetaminophen (PERCOCET) 7.5-325 MG per tablet Take 1 tablet by mouth every 6 (six) hours as needed for pain. 120 tablet 0  . polyethylene glycol (MIRALAX / GLYCOLAX) packet Take 17 g by mouth daily. (Patient taking differently: Take 17 g by mouth daily as needed (constipation). ) 14 each 0  . pravastatin (PRAVACHOL) 40 MG tablet Take 40 mg by mouth daily with breakfast.     . PRESCRIPTION MEDICATION Apply 1-2 Applicatorfuls topically 4 (four) times daily as needed (knee pain). Compounded cream with diclofenac and lidocaine for knee pain    . Multiple Vitamins-Minerals (MULTIVITAMIN PO) Take 1 tablet by mouth daily.    . [DISCONTINUED] Ranitidine HCl (ZANTAC 75 PO) Take by mouth.       No current facility-administered medications for this visit.    Review of Systems  Constitutional: Negative.   HENT: Negative.   Eyes:       Glasses  Respiratory: Negative.   Cardiovascular: Negative.   Gastrointestinal: Negative.   Genitourinary:       One episode of nocturia   Musculoskeletal: Positive for back pain and joint pain.  Skin: Negative.   Neurological: Negative.   Endo/Heme/Allergies: Negative.   Psychiatric/Behavioral: Negative.     PHYSICAL EXAMINATION: ECOG PERFORMANCE STATUS: 0 - Asymptomatic  Filed  Vitals:   10/29/14 1443  BP: 134/66  Pulse: 60  Temp: 97.4 F (36.3 C)  Resp: 20   Filed Weights   10/29/14 1443  Weight: 218 lb 3.2 oz (98.975 kg)     Physical Exam  Constitutional: She is oriented to person, place, and time and well-developed, well-nourished, and in no distress.  HENT:  Head: Normocephalic and atraumatic.  Nose: Nose normal.  Mouth/Throat: Oropharynx is clear and moist. No oropharyngeal exudate.  Eyes: Conjunctivae and EOM are normal. Pupils are equal, round, and reactive to light. Right eye exhibits no discharge. Left eye exhibits no discharge. No scleral icterus.  Neck: Normal range of motion. Neck supple. No tracheal deviation present. No thyromegaly present.  Cardiovascular: Normal rate, regular rhythm and normal heart sounds.  Exam reveals no gallop and no friction rub.   No murmur heard. Pulmonary/Chest: Effort normal and breath sounds normal. She has no wheezes. She has no rales.  Mild bruising at biopsy site of the R breast but overall well healing  Abdominal: Soft. Bowel sounds are normal. She exhibits no distension and no mass. There is no tenderness. There is no rebound and no guarding.  Musculoskeletal: Normal range of motion. She exhibits no edema.  Lymphadenopathy:    She has no cervical adenopathy.  Neurological: She is alert and oriented to person, place, and time. She has normal reflexes. No cranial nerve deficit. Gait normal. Coordination normal.  Skin: Skin is warm and dry. No rash noted.  Psychiatric: Mood, memory, affect and judgment normal.  Nursing note and vitals reviewed.    LABORATORY DATA:  I have reviewed the data as listed Lab Results  Component Value Date   WBC 6.2 11/12/2014   HGB 13.6 11/12/2014   HCT 40.9 11/12/2014   MCV 90.5 11/12/2014   PLT 225 11/12/2014     Chemistry      Component Value  Date/Time   NA 137 11/12/2014 1530   NA 142 08/19/2011 0826   K 3.3* 11/12/2014 1530   K 4.2 08/19/2011 0826   CL 101  11/12/2014 1530   CL 101 08/19/2011 0826   CO2 27 11/12/2014 1530   BUN 9 11/12/2014 1530   CREATININE 0.88 11/12/2014 1530      Component Value Date/Time   CALCIUM 8.9 11/12/2014 1530   CALCIUM 9.4 08/19/2011 0826   ALKPHOS 73 11/12/2014 1530   ALKPHOS 98 08/19/2011 0826   AST 29 11/12/2014 1530   AST 30 08/19/2011 0826   ALT 19 11/12/2014 1530   BILITOT 0.5 11/12/2014 1530   BILITOT 0.5 05/13/2011 0831       RADIOGRAPHIC STUDIES: I have personally reviewed the radiological images as listed and agreed with the findings in the report.  ASSESSMENT & PLAN:  Malignant neoplasm of upper outer quadrant of female breast Pleasant 69 year old female diagnosed with DCIS. She understands the results of her breast MRI. We spent time today discussing the different imaging modalities of the breasts, she had several questions regarding MRI and mammography. She had a lot of questions regarding the differences in results between her mammogram and MRI.  I provided her with reading information about DCIS, diagnosis and appropriate treatment. We discussed the possibility of a mastectomy given the findings on MRI. She understands that Dr. Brantley Stage will help her determine the most appropriate surgical therapy based on  the abnormalities on MRI. At the end of our visit today she felt she had no additional questions. She is very overwhelmed with her diagnosis. She is struggling with the possibility of needing a mastectomy. She is strongly considering reconstruction. She has an excellent support system in her husband. I have strongly encouraged her to consider support groups or other available sources through the cancer center. She was advised to contact us prior to her next follow-up visit if she needs additional counseling. I have again strongly encouraged her to utilize support systems through the cancer center.     No orders of the defined types were placed in this encounter.    All questions were  answered. The patient knows to call the clinic with any problems, questions or concerns.    Molli Hazard, MD MD 11/16/2014 7:50 AM

## 2014-10-30 ENCOUNTER — Ambulatory Visit (HOSPITAL_COMMUNITY): Payer: Medicare Other | Admitting: Physical Therapy

## 2014-10-31 ENCOUNTER — Encounter: Payer: Self-pay | Admitting: *Deleted

## 2014-10-31 NOTE — Progress Notes (Signed)
[facility] Clinical Social Work  Clinical Social Work was referred by patient navigator for assessment of psychosocial needs due to Sara [name] being very stressed.  Clinical Social Worker called Sara [name] to offer support and assess for needs.  Sara [name] confused about her surgery plans and payment issues. CSW listened to Sara [name]'s concerns and explained role of CSW. CSW has worked with Sara [name] in past at [facility].  CSW shared billing concerns with rn navigator who will assist. CSW to get new [employer] Guide for Sara [name] as she requested.    Clinical Social Work interventions:  Emotional support    Loren Racer, [place] Tuesdays 8:30-1pm Wednesdays 8:30-12pm  [phone]

## 2014-11-01 ENCOUNTER — Ambulatory Visit (HOSPITAL_COMMUNITY)
Admission: RE | Admit: 2014-11-01 | Discharge: 2014-11-01 | Disposition: A | Discharge status: Home / Self Care | Payer: Medicare Other | Source: Ambulatory Visit | Attending: Orthopedic Surgery | Admitting: Orthopedic Surgery

## 2014-11-01 DIAGNOSIS — Z471 Aftercare following joint replacement surgery: Secondary | ICD-10-CM | POA: Diagnosis not present

## 2014-11-01 DIAGNOSIS — R262 Difficulty in walking, not elsewhere classified: Secondary | ICD-10-CM | POA: Diagnosis not present

## 2014-11-01 DIAGNOSIS — M25662 Stiffness of left knee, not elsewhere classified: Secondary | ICD-10-CM | POA: Diagnosis not present

## 2014-11-01 DIAGNOSIS — Z96659 Presence of unspecified artificial knee joint: Secondary | ICD-10-CM

## 2014-11-01 DIAGNOSIS — M25561 Pain in right knee: Secondary | ICD-10-CM

## 2014-11-01 DIAGNOSIS — T8489XS Other specified complication of internal orthopedic prosthetic devices, implants and grafts, sequela: Secondary | ICD-10-CM

## 2014-11-01 DIAGNOSIS — R269 Unspecified abnormalities of gait and mobility: Secondary | ICD-10-CM

## 2014-11-01 DIAGNOSIS — M25669 Stiffness of unspecified knee, not elsewhere classified: Secondary | ICD-10-CM

## 2014-11-01 DIAGNOSIS — T8489XA Other specified complication of internal orthopedic prosthetic devices, implants and grafts, initial encounter: Secondary | ICD-10-CM

## 2014-11-01 DIAGNOSIS — Z96652 Presence of left artificial knee joint: Secondary | ICD-10-CM | POA: Diagnosis not present

## 2014-11-01 DIAGNOSIS — M25562 Pain in left knee: Secondary | ICD-10-CM

## 2014-11-01 DIAGNOSIS — M1712 Unilateral primary osteoarthritis, left knee: Secondary | ICD-10-CM

## 2014-11-01 NOTE — Therapy (Signed)
[place], [place], [address] Phone: [phone]   Fax:  [phone]  Physical Therapy Treatment  Patient Details  Name: Sara [name] MRN: [id number] Date of Birth: [birth year] Referring Provider:  Carole Civil, MD  Encounter Date: 11/01/2014      PT End of Session - 11/01/14 1512    Visit Number 22   Number of Visits 25   Date for PT Re-Evaluation 11/09/14   Authorization Type UHC Medicare   Authorization Time Period Gcode updated visit #18   Authorization - Visit Number 22   Authorization - Number of Visits 25   PT Start Time 1441   PT Stop Time 1512   PT Time Calculation (min) 31 min   Activity Tolerance Patient tolerated treatment well;Patient limited by fatigue   Behavior During Therapy [facility] for tasks assessed/performed      Past Medical History  Diagnosis Date  . Thyroid disease   . Chronic back pain   . Reflux   . Unspecified arthropathy, shoulder region   . Pure hypercholesterolemia     takes Pravastin daily  . Allergic rhinitis due to pollen   . Cervicalgia   . Diverticulosis of colon (without mention of hemorrhage)   . Morbid obesity   . Palpitations   . Peptic ulcer, unspecified site, unspecified as acute or chronic, without mention of hemorrhage, perforation, or obstruction   . Peripheral vertigo, unspecified     takes Meclizine prn  . Reflux esophagitis   . Spinal stenosis in cervical region   . 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 Personal history of tobacco use, presenting hazards to health   . Insomnia, unspecified   . Lumbago   . Right leg pain   . Shortness of breath   . H/O hiatal hernia   . HTN (hypertension)     takes Metoprolol and Lisinopril daily  . Hypertension   . Sleep apnea     slight but doesn't require a CPAP  . History of bronchitis     > 70yr[age]  . Seasonal allergies     uses  Flonase daily  . Headache(784.0)     denies migraines since the 80's but has occ and takes Butalbital prn  . Cervical spondylosis without myelopathy     all over  . Degeneration of cervical intervertebral disc   . Primary localized osteoarthrosis, lower leg   . GERD (gastroesophageal reflux disease)     takes Omeprazole daily  . Constipation   . Diverticulosis   . History of bladder infections   . Urinary incontinence     occasionally  . Other postablative hypothyroidism     takes SYnthroid daily  . Insomnia     takes Ativan nigtly    Past Surgical History  Procedure Laterality Date  . Knee surgery      arthroscopy-- R  . Tonsillectomy    . Appendectomy    . Colonoscopy  Sept 2008    RMR: normal rectum, left-sided diverticula, repeat in 2018  . Abdominal hysterectomy    . Upper gastrointestinal endoscopy    . Breast lumpectomy      left   . Eye surgery      cataracts removed- bilateral, /w IOL  . Lumpectomy on pelvis    . Diagnostic laparoscopy    . Tubal ligation    . Esophagogastroduodenoscopy    .  Anterior lat lumbar fusion  05/13/2012    Procedure: ANTERIOR LATERAL LUMBAR FUSION 2 LEVELS;  Surgeon: Faythe Ghee, MD;  Location: [facility];  Service: Neurosurgery;  Laterality: Left;  Left Lumbar Three-four,Lumbar four-five Extreme Lumbar Interbody Fusion with Percutaneous Pedicle Screws  . Spinal fusion    . Knee arthroscopy with lateral menisectomy Right 10/06/2013    Procedure: KNEE ARTHROSCOPY WITH LATERAL AND MEDIAL MENISECTOMY;  Surgeon: Carole Civil, MD;  Location: [facility];  Service: Orthopedics;  Laterality: Right;  END @ 1234  . Foreign body removal Right 10/06/2013    Procedure: REMOVAL FOREIGN BODY EXTREMITY;  Surgeon: Carole Civil, MD;  Location: [facility];  Service: Orthopedics;  Laterality: Right;  . Injection knee Left 10/06/2013    Procedure: KNEE INJECTION;  Surgeon: Carole Civil, MD;  Location: [facility];  Service: Orthopedics;  Laterality:  Left;  . Total knee arthroplasty Right 01/24/2014    Procedure: TOTAL KNEE ARTHROPLASTY;  Surgeon: Carole Civil, MD;  Location: [facility];  Service: Orthopedics;  Laterality: Right;  . Total knee arthroplasty Left 08/08/2014    Procedure: TOTAL KNEE ARTHROPLASTY;  Surgeon: Carole Civil, MD;  Location: [facility];  Service: Orthopedics;  Laterality: Left;    There were no vitals taken for this visit.  Visit Diagnosis:  No diagnosis found.      Subjective Assessment - 11/01/14 1445    Symptoms Patient arrived 12 moinutes late resulting in limited exercise secondary to increasedknee pain    Currently in Pain? Yes   Pain Location Knee   Pain Orientation Right;Left          [facility] Adult PT Treatment/Exercise - 11/01/14 0001    Knee/Hip Exercises: Stretches   Quad Stretch 3 reps;30 seconds   Quad Stretch Limitations prone with rope   Knee: Self-Stretch Limitations 14" Step 10 reps knee drives   Knee/Hip Exercises: Standing   Forward Lunges Both;5 reps   Forward Lunges Limitations common lunge matrix on floor   Functional Squat 5 reps;Other (comment);2 sets  heavy weight.   Functional Squat Limitations 15lb liftng from 12" and 3" from floor.    Other Standing Knee Exercises SFT squat with frontal plane neutral 3lb 5x each    Other Standing Knee Exercises 4way pick up and reach 5lb 10x             PT Short Term Goals - 10/26/14 1501    PT SHORT TERM GOAL #1   Title Patient will display increaseed knee flexion to 110 degrees to more easily squat to chair without weight shifting to Rt LE   Status Achieved   PT SHORT TERM GOAL #2   Title Patient will be able to demosntrate 5x sit to stand in < 15 seconds indicating patient not at high risk for falls.    Status Achieved   PT SHORT TERM GOAL #3   Title Patient will demonstrate increased ankle dorsiflexion of >10 degrees.    Status Achieved   PT SHORT TERM GOAL #4   Title Patient will report walking >71mnutes with tolerable  pain.    Baseline 40 minutes on 10/26/2014; 25 minutes on 10/17/2014 (was 15-20 09/12/2014)   Status Achieved           PT Long Term Goals - 10/26/14 1502    PT LONG TERM GOAL #1   Title Patient will display increaseed knee flexion to 120 degrees to more easily squat to chair without weight shifting to Rt LE  Status Achieved   PT LONG TERM GOAL #2   Title Patient will display increased knee flexion and extension strength on Lt to 5/5 MMT to be able to go up and down stairs without UE support.    Baseline Able to ascend and descend without UE support, noted weak eccen   Status Partially Met   PT LONG TERM GOAL #3   Title Patient will report walking >40mnutes to get groceries with pain <2/10   Status On-going   PT LONG TERM GOAL #4   Title Pt able to lift 25# in proper form from floor with proper weight loadining for full knee flexion with functional activities at home.   Baseline 70 degrees knee flexion with proper lifting   Status New               Plan - 11/01/14 1512    Clinical Impression Statement patient performed all lifting activities with good mechnaics but demosntrated fatigue quickly with lliftign 15lb from the floor. no pain noted with lifting following stretching of quadriceps muscles.    PT Next Visit Plan Continue to imporove squat depth with proper lifting and LE functional strenghtening.  Resume Nustep next session for activity tolerance.          Problem List Patient Active Problem List   Diagnosis Date Noted  . Malignant neoplasm of upper outer quadrant of female breast 10/11/2014  . S/P total knee replacement 08/08/2014  . Difficulty in walking(719.7) 02/15/2014  . Postoperative stiffness of total knee replacement 02/15/2014  . Pain in joint, lower leg 02/15/2014  . Abnormality of gait 10/11/2013  . S/P arthroscopy of right knee 10/09/2013  . Radicular pain of right lower extremity 07/25/2013  . Knee instability 07/25/2013  . Right knee pain  07/25/2013  . Chest pain 04/25/2013  . Patellar tendonitis 09/07/2012  . OA (osteoarthritis) of knee 02/09/2012  . GERD (gastroesophageal reflux disease) 12/01/2011  . Dysphagia 12/01/2011  . RUQ pain 12/01/2011  . Fatty liver 12/01/2011  . Hematochezia 12/01/2011  . INTERMITTENT VERTIGO 09/12/2010  . KNEE, ARTHRITIS, DEGEN./OSTEO 04/03/2009  . CHEST PAIN, RECURRENT 03/07/2009  . OTHER DYSPHAGIA 03/07/2009  . PALPITATIONS 02/06/2009  . LIVER FUNCTION TESTS, ABNORMAL, HX OF 01/01/2009  . MORBID OBESITY 12/31/2008  . PUD 12/31/2008  . BACK PAIN 12/24/2008  . ANSERINE BURSITIS 12/24/2008  . BENIGN POSITIONAL VERTIGO 07/16/2008  . H N P-LUMBAR 04/02/2008  . SPINAL STENOSIS, CERVICAL 02/15/2008  . SPINAL STENOSIS, LUMBAR 02/15/2008  . Patellar tendinitis 10/19/2007  . TEAR LATERAL MENISCUS 06/13/2007  . OBESITY NOS 10/29/2006  . ALLERGIC RHINITIS, SEASONAL 10/29/2006  . HYPOTHYROIDISM 09/07/2006  . HYPERLIPIDEMIA 09/07/2006  . HYPERTENSION 09/07/2006  . GERD 09/07/2006  . DIVERTICULOSIS, COLON 09/07/2006  . OVERACTIVE BLADDER 09/07/2006  . FIBROCYSTIC BREAST DISEASE 09/07/2006  . OSTEOARTHRITIS 09/07/2006    CDevona KonigPT DPT [id number] [address]Phone: [phone]  Fax:  [phone]

## 2014-11-06 ENCOUNTER — Ambulatory Visit (HOSPITAL_COMMUNITY)
Admission: RE | Admit: 2014-11-06 | Discharge: 2014-11-06 | Disposition: A | Discharge status: Home / Self Care | Payer: Medicare Other | Source: Ambulatory Visit | Attending: Orthopedic Surgery | Admitting: Orthopedic Surgery

## 2014-11-06 DIAGNOSIS — M1712 Unilateral primary osteoarthritis, left knee: Secondary | ICD-10-CM

## 2014-11-06 DIAGNOSIS — M25669 Stiffness of unspecified knee, not elsewhere classified: Secondary | ICD-10-CM

## 2014-11-06 DIAGNOSIS — M25662 Stiffness of left knee, not elsewhere classified: Secondary | ICD-10-CM | POA: Diagnosis not present

## 2014-11-06 DIAGNOSIS — Z471 Aftercare following joint replacement surgery: Secondary | ICD-10-CM | POA: Diagnosis not present

## 2014-11-06 DIAGNOSIS — R262 Difficulty in walking, not elsewhere classified: Secondary | ICD-10-CM | POA: Diagnosis not present

## 2014-11-06 DIAGNOSIS — M25562 Pain in left knee: Secondary | ICD-10-CM | POA: Diagnosis not present

## 2014-11-06 DIAGNOSIS — R29898 Other symptoms and signs involving the musculoskeletal system: Secondary | ICD-10-CM

## 2014-11-06 DIAGNOSIS — T8489XS Other specified complication of internal orthopedic prosthetic devices, implants and grafts, sequela: Secondary | ICD-10-CM

## 2014-11-06 DIAGNOSIS — Z96659 Presence of unspecified artificial knee joint: Secondary | ICD-10-CM

## 2014-11-06 DIAGNOSIS — Z96652 Presence of left artificial knee joint: Secondary | ICD-10-CM | POA: Diagnosis not present

## 2014-11-06 DIAGNOSIS — T8489XA Other specified complication of internal orthopedic prosthetic devices, implants and grafts, initial encounter: Secondary | ICD-10-CM

## 2014-11-06 DIAGNOSIS — R269 Unspecified abnormalities of gait and mobility: Secondary | ICD-10-CM

## 2014-11-06 DIAGNOSIS — M25561 Pain in right knee: Secondary | ICD-10-CM

## 2014-11-06 NOTE — Therapy (Signed)
[place], [place], [address] Phone: [phone]   Fax:  [phone]  Physical Therapy Treatment  Patient Details  Name: Sara [name] MRN: [id number] Date of Birth: Nov 26, 1945 Referring Provider:  Carole Civil, MD  Encounter Date: 11/06/2014      PT End of Session - 11/06/14 1600    Visit Number 23   Number of Visits 25   Date for PT Re-Evaluation 11/09/14   Authorization Type UHC Medicare   Authorization Time Period Gcode updated visit #18   Authorization - Visit Number 23   Authorization - Number of Visits 25   PT Start Time 1518   PT Stop Time 1605   PT Time Calculation (min) 47 min   Activity Tolerance Patient tolerated treatment well;Patient limited by fatigue   Behavior During Therapy [facility] for tasks assessed/performed      Past Medical History  Diagnosis Date  . Thyroid disease   . Chronic back pain   . Reflux   . Unspecified arthropathy, shoulder region   . Pure hypercholesterolemia     takes Pravastin daily  . Allergic rhinitis due to pollen   . Cervicalgia   . Diverticulosis of colon (without mention of hemorrhage)   . Morbid obesity   . Palpitations   . Peptic ulcer, unspecified site, unspecified as acute or chronic, without mention of hemorrhage, perforation, or obstruction   . Peripheral vertigo, unspecified     takes Meclizine prn  . Reflux esophagitis   . Spinal stenosis in cervical region   . 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 Personal history of tobacco use, presenting hazards to health   . Insomnia, unspecified   . Lumbago   . Right leg pain   . Shortness of breath   . H/O hiatal hernia   . HTN (hypertension)     takes Metoprolol and Lisinopril daily  . Hypertension   . Sleep apnea     slight but doesn't require a CPAP  . History of bronchitis     > 9yr ago  . Seasonal allergies     uses  Flonase daily  . Headache(784.0)     denies migraines since the 80's but has occ and takes Butalbital prn  . Cervical spondylosis without myelopathy     all over  . Degeneration of cervical intervertebral disc   . Primary localized osteoarthrosis, lower leg   . GERD (gastroesophageal reflux disease)     takes Omeprazole daily  . Constipation   . Diverticulosis   . History of bladder infections   . Urinary incontinence     occasionally  . Other postablative hypothyroidism     takes SYnthroid daily  . Insomnia     takes Ativan nigtly    Past Surgical History  Procedure Laterality Date  . Knee surgery      arthroscopy-- R  . Tonsillectomy    . Appendectomy    . Colonoscopy  Sept 2008    RMR: normal rectum, left-sided diverticula, repeat in 2018  . Abdominal hysterectomy    . Upper gastrointestinal endoscopy    . Breast lumpectomy      left   . Eye surgery      cataracts removed- bilateral, /w IOL  . Lumpectomy on pelvis    . Diagnostic laparoscopy    . Tubal ligation    . Esophagogastroduodenoscopy    .  Anterior lat lumbar fusion  05/13/2012    Procedure: ANTERIOR LATERAL LUMBAR FUSION 2 LEVELS;  Surgeon: Reinaldo Meeker, MD;  Location: [facility];  Service: Neurosurgery;  Laterality: Left;  Left Lumbar Three-four,Lumbar four-five Extreme Lumbar Interbody Fusion with Percutaneous Pedicle Screws  . Spinal fusion    . Knee arthroscopy with lateral menisectomy Right 10/06/2013    Procedure: KNEE ARTHROSCOPY WITH LATERAL AND MEDIAL MENISECTOMY;  Surgeon: Vickki Hearing, MD;  Location: [facility];  Service: Orthopedics;  Laterality: Right;  END @ 1234  . Foreign body removal Right 10/06/2013    Procedure: REMOVAL FOREIGN BODY EXTREMITY;  Surgeon: Vickki Hearing, MD;  Location: [facility];  Service: Orthopedics;  Laterality: Right;  . Injection knee Left 10/06/2013    Procedure: KNEE INJECTION;  Surgeon: Vickki Hearing, MD;  Location: [facility];  Service: Orthopedics;  Laterality:  Left;  . Total knee arthroplasty Right 01/24/2014    Procedure: TOTAL KNEE ARTHROPLASTY;  Surgeon: Vickki Hearing, MD;  Location: [facility];  Service: Orthopedics;  Laterality: Right;  . Total knee arthroplasty Left 08/08/2014    Procedure: TOTAL KNEE ARTHROPLASTY;  Surgeon: Vickki Hearing, MD;  Location: [facility];  Service: Orthopedics;  Laterality: Left;    There were no vitals taken for this visit.  Visit Diagnosis:  Primary osteoarthritis of left knee  Postoperative stiffness of total knee replacement, initial encounter  Difficulty walking  Abnormality of gait  Right knee pain  Left knee pain  Postoperative stiffness of total knee replacement, sequela  Left leg weakness  Pain in joint, lower leg, right  Pain in joint, lower leg, left      Subjective Assessment - 11/06/14 1526    Symptoms Pt stated increased pain on lateral tight today pain scale 2-3/10   Currently in Pain? Yes   Pain Score 3    Pain Location Knee   Pain Orientation Left                    [facility] Adult PT Treatment/Exercise - 11/06/14 0001    Exercises   Exercises Knee/Hip   Knee/Hip Exercises: Stretches   Active Hamstring Stretch 3 reps;30 seconds   Active Hamstring Stretch Limitations 14", 3 way   Quad Stretch 3 reps;30 seconds   Quad Stretch Limitations prone with rope   ITB Stretch 2 reps;30 seconds   ITB Stretch Limitations 6 in step   Gastroc Stretch 3 reps;30 seconds   Gastroc Stretch Limitations 3 way stretch slant board   Knee/Hip Exercises: Aerobic   Stationary Bike [place] #4, Lv 3, 10'   Knee/Hip Exercises: Standing   Forward Lunges Both;5 reps   Forward Lunges Limitations common and uncommon lunge matrix on floor   Functional Squat 5 reps   Functional Squat Limitations 15lb liftng from 12" and 3" from floor.                   PT Short Term Goals - 11/06/14 1616    PT SHORT TERM GOAL #1   Title Patient will display increaseed knee flexion to 110  degrees to more easily squat to chair without weight shifting to Rt LE   Status Achieved   PT SHORT TERM GOAL #2   Title Patient will be able to demosntrate 5x sit to stand in < 15 seconds indicating patient not at high risk for falls.    Status Achieved   PT SHORT TERM GOAL #3   Title Patient will  demonstrate increased ankle dorsiflexion of >10 degrees.    Status Achieved   PT SHORT TERM GOAL #4   Title Patient will report walking >26minutes with tolerable pain.    Status Achieved           PT Long Term Goals - 11/06/14 1617    PT LONG TERM GOAL #1   Title Patient will display increaseed knee flexion to 120 degrees to more easily squat to chair without weight shifting to Rt LE   Status Achieved   PT LONG TERM GOAL #2   Title Patient will display increased knee flexion and extension strength on Lt to 5/5 MMT to be able to go up and down stairs without UE support.    Status Partially Met   PT LONG TERM GOAL #3   Title Patient will report walking >72minutes to get groceries with pain <2/10   Status On-going   PT LONG TERM GOAL #4   Title Pt able to lift 25# in proper form from floor with proper weight loadining for full knee flexion with functional activities at home.   Status On-going               Plan - 11/06/14 1600    Clinical Impression Statement Added ITB stretch following reports of increased pain on lateral aspect of thigh with reports of pain reduced following stretchs.  Reduced frequency with proper lifging following reports of increased pain.  Progressed to uncommon lunge matrix for ROJM and strengtheing.  Resumed Nustep for strengtheining and activity tolerance, pt limited by fatigue at end of session.   PT Next Visit Plan Continue to imporove squat depth with proper lifting and LE functional strenghtening.          Problem List Patient Active Problem List   Diagnosis Date Noted  . Malignant neoplasm of upper outer quadrant of female breast 10/11/2014  .  S/P total knee replacement 08/08/2014  . Difficulty in walking(719.7) 02/15/2014  . Postoperative stiffness of total knee replacement 02/15/2014  . Pain in joint, lower leg 02/15/2014  . Abnormality of gait 10/11/2013  . S/P arthroscopy of right knee 10/09/2013  . Radicular pain of right lower extremity 07/25/2013  . Knee instability 07/25/2013  . Right knee pain 07/25/2013  . Chest pain 04/25/2013  . Patellar tendonitis 09/07/2012  . OA (osteoarthritis) of knee 02/09/2012  . GERD (gastroesophageal reflux disease) 12/01/2011  . Dysphagia 12/01/2011  . RUQ pain 12/01/2011  . Fatty liver 12/01/2011  . Hematochezia 12/01/2011  . INTERMITTENT VERTIGO 09/12/2010  . KNEE, ARTHRITIS, DEGEN./OSTEO 04/03/2009  . CHEST PAIN, RECURRENT 03/07/2009  . OTHER DYSPHAGIA 03/07/2009  . PALPITATIONS 02/06/2009  . LIVER FUNCTION TESTS, ABNORMAL, HX OF 01/01/2009  . MORBID OBESITY 12/31/2008  . PUD 12/31/2008  . BACK PAIN 12/24/2008  . ANSERINE BURSITIS 12/24/2008  . BENIGN POSITIONAL VERTIGO 07/16/2008  . H N P-LUMBAR 04/02/2008  . SPINAL STENOSIS, CERVICAL 02/15/2008  . SPINAL STENOSIS, LUMBAR 02/15/2008  . Patellar tendinitis 10/19/2007  . TEAR LATERAL MENISCUS 06/13/2007  . OBESITY NOS 10/29/2006  . ALLERGIC RHINITIS, SEASONAL 10/29/2006  . HYPOTHYROIDISM 09/07/2006  . HYPERLIPIDEMIA 09/07/2006  . HYPERTENSION 09/07/2006  . GERD 09/07/2006  . DIVERTICULOSIS, COLON 09/07/2006  . OVERACTIVE BLADDER 09/07/2006  . FIBROCYSTIC BREAST DISEASE 09/07/2006  . OSTEOARTHRITIS 09/07/2006   Ihor Austin, [place]  Aldona Lento 11/06/2014, 4:20 PM  [place] [address] [place], [place], [address] Phone: [phone]   Fax:  [phone]      

## 2014-11-08 ENCOUNTER — Ambulatory Visit (HOSPITAL_COMMUNITY): Payer: Medicare Other | Admitting: Physical Therapy

## 2014-11-09 ENCOUNTER — Other Ambulatory Visit (HOSPITAL_COMMUNITY): Payer: Self-pay | Admitting: Plastic Surgery

## 2014-11-09 DIAGNOSIS — D0511 Intraductal carcinoma in situ of right breast: Secondary | ICD-10-CM | POA: Diagnosis not present

## 2014-11-12 ENCOUNTER — Encounter (HOSPITAL_COMMUNITY)
Admission: RE | Admit: 2014-11-12 | Discharge: 2014-11-12 | Disposition: A | Discharge status: Home / Self Care | Payer: Medicare Other | Source: Ambulatory Visit | Attending: Surgery | Admitting: Surgery

## 2014-11-12 ENCOUNTER — Encounter (HOSPITAL_COMMUNITY): Payer: Self-pay

## 2014-11-12 VITALS — BP 132/69 | HR 54 | Temp 97.8°F | Resp 20 | Ht 66.0 in | Wt 217.0 lb

## 2014-11-12 DIAGNOSIS — K219 Gastro-esophageal reflux disease without esophagitis: Secondary | ICD-10-CM | POA: Insufficient documentation

## 2014-11-12 DIAGNOSIS — D0511 Intraductal carcinoma in situ of right breast: Secondary | ICD-10-CM | POA: Insufficient documentation

## 2014-11-12 DIAGNOSIS — Z981 Arthrodesis status: Secondary | ICD-10-CM | POA: Insufficient documentation

## 2014-11-12 DIAGNOSIS — G4733 Obstructive sleep apnea (adult) (pediatric): Secondary | ICD-10-CM | POA: Insufficient documentation

## 2014-11-12 DIAGNOSIS — Z01818 Encounter for other preprocedural examination: Secondary | ICD-10-CM | POA: Insufficient documentation

## 2014-11-12 DIAGNOSIS — E785 Hyperlipidemia, unspecified: Secondary | ICD-10-CM | POA: Diagnosis not present

## 2014-11-12 DIAGNOSIS — I10 Essential (primary) hypertension: Secondary | ICD-10-CM | POA: Insufficient documentation

## 2014-11-12 DIAGNOSIS — Z01812 Encounter for preprocedural laboratory examination: Secondary | ICD-10-CM | POA: Diagnosis not present

## 2014-11-12 DIAGNOSIS — Z87891 Personal history of nicotine dependence: Secondary | ICD-10-CM | POA: Diagnosis not present

## 2014-11-12 DIAGNOSIS — C50919 Malignant neoplasm of unspecified site of unspecified female breast: Secondary | ICD-10-CM | POA: Insufficient documentation

## 2014-11-12 DIAGNOSIS — E039 Hypothyroidism, unspecified: Secondary | ICD-10-CM | POA: Insufficient documentation

## 2014-11-12 HISTORY — DX: Pain in unspecified joint: M25.50

## 2014-11-12 HISTORY — DX: Effusion, unspecified joint: M25.40

## 2014-11-12 LAB — COMPREHENSIVE METABOLIC PANEL
ALT: 19 U/L (ref 0–35)
AST: 29 U/L (ref 0–37)
Albumin: 3.9 g/dL (ref 3.5–5.2)
Alkaline Phosphatase: 73 U/L (ref 39–117)
Anion gap: 9 (ref 5–15)
BUN: 9 mg/dL (ref 6–23)
CO2: 27 mmol/L (ref 19–32)
Calcium: 8.9 mg/dL (ref 8.4–10.5)
Chloride: 101 mmol/L (ref 96–112)
Creatinine, Ser: 0.88 mg/dL (ref 0.50–1.10)
GFR calc Af Amer: 77 mL/min — ABNORMAL LOW (ref >90)
GFR calc non Af Amer: 66 mL/min — ABNORMAL LOW (ref >90)
Glucose, Bld: 77 mg/dL (ref 70–99)
Potassium: 3.3 mmol/L — ABNORMAL LOW (ref 3.5–5.1)
Sodium: 137 mmol/L (ref 135–145)
Total Bilirubin: 0.5 mg/dL (ref 0.3–1.2)
Total Protein: 7.5 g/dL (ref 6.0–8.3)

## 2014-11-12 LAB — CBC WITH DIFFERENTIAL/PLATELET
Basophils Absolute: 0 10*3/uL (ref 0.0–0.1)
Basophils Relative: 1 % (ref 0–1)
Eosinophils Absolute: 0.1 10*3/uL (ref 0.0–0.7)
Eosinophils Relative: 2 % (ref 0–5)
HCT: 40.9 % (ref 36.0–46.0)
Hemoglobin: 13.6 g/dL (ref 12.0–15.0)
Lymphocytes Relative: 40 % (ref 12–46)
Lymphs Abs: 2.5 10*3/uL (ref 0.7–4.0)
MCH: 30.1 pg (ref 26.0–34.0)
MCHC: 33.3 g/dL (ref 30.0–36.0)
MCV: 90.5 fL (ref 78.0–100.0)
Monocytes Absolute: 0.6 10*3/uL (ref 0.1–1.0)
Monocytes Relative: 9 % (ref 3–12)
Neutro Abs: 3 10*3/uL (ref 1.7–7.7)
Neutrophils Relative %: 48 % (ref 43–77)
Platelets: 225 10*3/uL (ref 150–400)
RBC: 4.52 MIL/uL (ref 3.87–5.11)
RDW: 14.1 % (ref 11.5–15.5)
WBC: 6.2 10*3/uL (ref 4.0–10.5)

## 2014-11-12 MED ORDER — CHLORHEXIDINE GLUCONATE 4 % EX LIQD: 1.0000 "application " | Freq: Once | CUTANEOUS | Status: DC

## 2014-11-12 NOTE — Progress Notes (Addendum)
Echo reports in epic from 2012/2014  Stress test reports in epic from 2004/2014  EKG in epic from 08-03-14  Sleep study in epic from 2012/2013  Medical Md is [place]  Cardiologist is Dr.Nishan with last visit a couple of yrs ago-sees as needed

## 2014-11-12 NOTE — Pre-Procedure Instructions (Signed)
Sara [name]  11/12/2014   Your procedure is scheduled on:  Tues, Mar 1 @ 11:10AM  Report to [facility]  at 9:00 AM.  Call this number if you have problems the morning of surgery: [phone]   Remember:   Do not eat food or drink liquids after midnight.   Take these medicines the morning of surgery with A SIP OF WATER: Synthroid(Levothyroxine),Metoprolol(Lopressor),Omeprazole(Prilosec),and Pain Pill(if needed)               No Goody's,BC's,Aleve,Aspirin,Ibuprofen,Fish Oil,or any Herbal Medications   Do not wear jewelry, make-up or nail polish.  Do not wear lotions, powders, or perfumes.   Do not shave 48 hours prior to surgery.   Do not bring valuables to the hospital.  [facility] is not responsible                  for any belongings or valuables.               Contacts, dentures or bridgework may not be worn into surgery.  Leave suitcase in the car. After surgery it may be brought to your room.  For patients admitted to the hospital, discharge time is determined by your                treatment team.               Special Instructions:  [facility] - Preparing for Surgery  Before surgery, you can play an important role.  Because skin is not sterile, your skin needs to be as free of germs as possible.  You can reduce the number of germs on you skin by washing with CHG (chlorahexidine gluconate) soap before surgery.  CHG is an antiseptic cleaner which kills germs and bonds with the skin to continue killing germs even after washing.  Please DO NOT use if you have an allergy to CHG or antibacterial soaps.  If your skin becomes reddened/irritated stop using the CHG and inform your nurse when you arrive at Short Stay.  Do not shave (including legs and underarms) for at least 48 hours prior to the first CHG shower.  You may shave your face.  Please follow these instructions carefully:   1.  Shower with CHG Soap the night before surgery and the                                 morning of Surgery.  2.  If you choose to wash your hair, wash your hair first as usual with your       normal shampoo.  3.  After you shampoo, rinse your hair and body thoroughly to remove the                      Shampoo.  4.  Use CHG as you would any other liquid soap.  You can apply chg directly       to the skin and wash gently with scrungie or a clean washcloth.  5.  Apply the CHG Soap to your body ONLY FROM THE NECK DOWN.        Do not use on open wounds or open sores.  Avoid contact with your eyes,       ears, mouth and genitals (private parts).  Wash genitals (private parts)       with your normal soap.  6.  Wash  thoroughly, paying special attention to the area where your surgery        will be performed.  7.  Thoroughly rinse your body with warm water from the neck down.  8.  DO NOT shower/wash with your normal soap after using and rinsing off       the CHG Soap.  9.  Pat yourself dry with a clean towel.            10.  Wear clean pajamas.            11.  Place clean sheets on your bed the night of your first shower and do not        sleep with pets.  Day of Surgery  Do not apply any lotions/deoderants the morning of surgery.  Please wear clean clothes to the hospital/surgery center.     Please read over the following fact sheets that you were given: Pain Booklet, Coughing and Deep Breathing and Surgical Site Infection Prevention

## 2014-11-13 ENCOUNTER — Ambulatory Visit (HOSPITAL_COMMUNITY): Payer: Medicare Other

## 2014-11-13 DIAGNOSIS — M25562 Pain in left knee: Secondary | ICD-10-CM

## 2014-11-13 DIAGNOSIS — T8489XA Other specified complication of internal orthopedic prosthetic devices, implants and grafts, initial encounter: Secondary | ICD-10-CM

## 2014-11-13 DIAGNOSIS — M25561 Pain in right knee: Secondary | ICD-10-CM

## 2014-11-13 DIAGNOSIS — R262 Difficulty in walking, not elsewhere classified: Secondary | ICD-10-CM

## 2014-11-13 DIAGNOSIS — T8489XS Other specified complication of internal orthopedic prosthetic devices, implants and grafts, sequela: Secondary | ICD-10-CM

## 2014-11-13 DIAGNOSIS — Z96652 Presence of left artificial knee joint: Secondary | ICD-10-CM | POA: Diagnosis not present

## 2014-11-13 DIAGNOSIS — M25669 Stiffness of unspecified knee, not elsewhere classified: Secondary | ICD-10-CM

## 2014-11-13 DIAGNOSIS — Z471 Aftercare following joint replacement surgery: Secondary | ICD-10-CM | POA: Diagnosis not present

## 2014-11-13 DIAGNOSIS — M1712 Unilateral primary osteoarthritis, left knee: Secondary | ICD-10-CM

## 2014-11-13 DIAGNOSIS — M25662 Stiffness of left knee, not elsewhere classified: Secondary | ICD-10-CM | POA: Diagnosis not present

## 2014-11-13 DIAGNOSIS — R269 Unspecified abnormalities of gait and mobility: Secondary | ICD-10-CM

## 2014-11-13 DIAGNOSIS — R29898 Other symptoms and signs involving the musculoskeletal system: Secondary | ICD-10-CM

## 2014-11-13 DIAGNOSIS — Z96659 Presence of unspecified artificial knee joint: Secondary | ICD-10-CM

## 2014-11-13 NOTE — Assessment & Plan Note (Addendum)
Pleasant 69 year old female diagnosed with DCIS. She understands the results of her breast MRI. We spent time today discussing the different imaging modalities of the breasts, she had several questions regarding MRI and mammography. She had a lot of questions regarding the differences in results between her mammogram and MRI.  I provided her with reading information about DCIS, diagnosis and appropriate treatment. We discussed the possibility of a mastectomy given the findings on MRI. She understands that Dr. Brantley Stage will help her determine the most appropriate surgical therapy based on  the abnormalities on MRI. At the end of our visit today she felt she had no additional questions. She is very overwhelmed with her diagnosis. She is struggling with the possibility of needing a mastectomy. She is strongly considering reconstruction. She has an excellent support system in her husband. I have strongly encouraged her to consider support groups or other available sources through the cancer center. She was advised to contact us prior to her next follow-up visit if she needs additional counseling. I have again strongly encouraged her to utilize support systems through the cancer center.

## 2014-11-13 NOTE — Patient Instructions (Signed)
Hamstring Stretch (Standing)   Standing, place one heel on chair or bench. Use one or both hands on thigh for support. Keeping torso straight, lean forward slowly until a stretch is felt in back of same thigh. Hold ____ seconds. Repeat with other leg.  Copyright  VHI. All rights reserved. Calf Stretch   Stand with hands supported on wall, elbows slightly bent, front knee bent, back knee straight, feet parallel and both heels on floor. Lean into wall by pushing hips forward until a stretch is felt in calf muscle. Hold ____ seconds. Repeat with leg positions switched.  Copyright  VHI. All rights reserved.  Knee / Archie Balboa on stomach, knees together. Grab one ankle with same side hand. Use towel if needed to reach. Gently pull foot toward buttock. Hold ____ seconds. Repeat with other leg. Repeat ____ times. Do ____ sessions per day.  Copyright  VHI. All rights reserved.   FUNCTIONAL MOBILITY: Squat   Stance: shoulder-width on floor. Bend hips and knees. Keep back straight. Do not allow knees to bend past toes. Squeeze glutes and quads to stand. ___ reps per set, ___ sets per day, ___ days per week  Copyright  VHI. All rights reserved.

## 2014-11-13 NOTE — Therapy (Signed)
[place], [place], [address] Phone: [phone]   Fax:  [phone]  Physical Therapy Treatment  Patient Details  Name: Sara [name] MRN: [id number] Date of Birth: [birth year] Referring Provider:  Carole Civil, MD  Encounter Date: 11/13/2014      PT End of Session - 11/13/14 1509    Visit Number 24   Number of Visits 25   Date for PT Re-Evaluation 11/09/14   Authorization Type [employer]   Authorization Time Period Gcode updated visit #18   Authorization - Visit Number 24   Authorization - Number of Visits 25   PT Start Time 9476   PT Stop Time 1512   PT Time Calculation (min) 39 min   Activity Tolerance Patient tolerated treatment well   Behavior During Therapy [facility] for tasks assessed/performed      Past Medical History  Diagnosis Date  . Pure hypercholesterolemia     takes Pravastin daily  . Allergic rhinitis due to pollen   . Cervicalgia   . Diverticulosis of colon (without mention of hemorrhage)   . Morbid obesity   . Palpitations   . Peptic ulcer, unspecified site, unspecified as acute or chronic, without mention of hemorrhage, perforation, or obstruction   . Reflux esophagitis   . 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 Insomnia, unspecified   . Shortness of breath   . H/O hiatal hernia   . HTN (hypertension)     takes Metoprolol and Lisinopril daily  . Hypertension   . Sleep apnea     slight but doesn't require a CPAP  . History of bronchitis     > 65yr ago  . Cervical spondylosis without myelopathy     all over  . Degeneration of cervical intervertebral disc   . Primary localized osteoarthrosis, lower leg   . GERD (gastroesophageal reflux disease)     takes Omeprazole daily  . Constipation     takes Miralax daily  . History of bladder infections   . Urinary incontinence     occasionally  . Insomnia      takes Ativan nigtly  . Other postablative hypothyroidism     takes SYnthroid daily  . Headache(784.0)     denies migraines since the 80's but has occ and takes Butalbital prn  . Vertigo     hx of;no meds   . Joint pain   . Joint swelling     Past Surgical History  Procedure Laterality Date  . Knee surgery      arthroscopy-- R  . Tonsillectomy    . Appendectomy    . Colonoscopy  Sept 2008    RMR: normal rectum, left-sided diverticula, repeat in 2018  . Abdominal hysterectomy    . Upper gastrointestinal endoscopy    . Breast lumpectomy      left   . Eye surgery      cataracts removed- bilateral, /w IOL  . Lumpectomy on pelvis    . Diagnostic laparoscopy    . Tubal ligation    . Esophagogastroduodenoscopy    . Anterior lat lumbar fusion  05/13/2012    Procedure: ANTERIOR LATERAL LUMBAR FUSION 2 LEVELS;  Surgeon: Faythe Ghee, MD;  Location: [place];  Service: Neurosurgery;  Laterality: Left;  Left Lumbar Three-four,Lumbar four-five Extreme Lumbar Interbody Fusion with Percutaneous Pedicle Screws  . Spinal fusion    .  Knee arthroscopy with lateral menisectomy Right 10/06/2013    Procedure: KNEE ARTHROSCOPY WITH LATERAL AND MEDIAL MENISECTOMY;  Surgeon: Carole Civil, MD;  Location: [facility];  Service: Orthopedics;  Laterality: Right;  END @ 1234  . Foreign body removal Right 10/06/2013    Procedure: REMOVAL FOREIGN BODY EXTREMITY;  Surgeon: Carole Civil, MD;  Location: [facility];  Service: Orthopedics;  Laterality: Right;  . Injection knee Left 10/06/2013    Procedure: KNEE INJECTION;  Surgeon: Carole Civil, MD;  Location: [facility];  Service: Orthopedics;  Laterality: Left;  . Total knee arthroplasty Right 01/24/2014    Procedure: TOTAL KNEE ARTHROPLASTY;  Surgeon: Carole Civil, MD;  Location: [facility];  Service: Orthopedics;  Laterality: Right;  . Total knee arthroplasty Left 08/08/2014    Procedure: TOTAL KNEE ARTHROPLASTY;  Surgeon: Carole Civil, MD;   Location: [facility];  Service: Orthopedics;  Laterality: Left;    There were no vitals taken for this visit.  Visit Diagnosis:  Primary osteoarthritis of left knee  Postoperative stiffness of total knee replacement, initial encounter  Difficulty walking  Abnormality of gait  Right knee pain  Left knee pain  Postoperative stiffness of total knee replacement, sequela  Left leg weakness  Pain in joint, lower leg, right  Pain in joint, lower leg, left      Subjective Assessment - 11/13/14 1436    Symptoms Pt stated today last day of PT.  Surgery for removal of Rt breast scheduled for next Tuesday.     How long can you stand comfortably? Able to stand comfortably for 25-30 minutes   How long can you walk comfortably? Able to walk for 30 minutes (was 25 minutes)   Currently in Pain? Yes   Pain Score 2    Pain Location Knee   Pain Orientation Left          [facility] PT Assessment - 11/13/14 1454    Assessment   Medical Diagnosis Lt TKA, lt knee stiffness, lt knee pain.    Onset Date 08/08/14   Next MD Visit Arther Abbott, 01/17/2015   Prior Therapy home health 3 visits   Observation/Other Assessments   Focus on Therapeutic Outcomes (FOTO)  19% limited was 34%   AROM   AROM Assessment Site Knee;Hip   Right/Left Hip Left   Left Hip External Rotation  58   Left Hip Internal Rotation  60   Right/Left Knee Left   Left Knee Extension 0   Left Knee Flexion 124   Left Ankle Dorsiflexion 20  was 15   Strength   Left Hip Flexion 5/5  was 4+/5   Left Hip Extension 4/5  was 4+/5   Left Hip ABduction 5/5  was 4/5   Left Knee Flexion 5/5  was 4+/5   Left Knee Extension 5/5  was 4+/5   Left Ankle Dorsiflexion 5/5  was 4+/5                            PT Short Term Goals - 11/13/14 1437    PT SHORT TERM GOAL #1   Title Patient will display increaseed knee flexion to 110 degrees to more easily squat to chair without weight shifting to Rt LE   Status  Achieved   PT SHORT TERM GOAL #2   Title Patient will be able to demosntrate 5x sit to stand in < 15 seconds indicating patient not at high risk for  falls.    Status Achieved   PT SHORT TERM GOAL #3   Title Patient will demonstrate increased ankle dorsiflexion of >10 degrees.    Status Achieved   PT SHORT TERM GOAL #4   Title Patient will report walking >16minutes with tolerable pain.    Status Achieved           PT Long Term Goals - 11/13/14 1437    PT LONG TERM GOAL #1   Title Patient will display increaseed knee flexion to 120 degrees to more easily squat to chair without weight shifting to Rt LE   Status Achieved   PT LONG TERM GOAL #2   Title Patient will display increased knee flexion and extension strength on Lt to 5/5 MMT to be able to go up and down stairs without UE support.    Status Achieved   PT LONG TERM GOAL #3   Title Patient will report walking >18minutes to get groceries with pain <2/10   Baseline Able to walk comfortably for 30 minutes, is able to walk 45 minutes with increased back pain.     Status On-going   PT LONG TERM GOAL #4   Title Pt able to lift 25# in proper form from floor with proper weight loadining for full knee flexion with functional activities at home.   Baseline 110 degrees knee flexion with min cueing for proper lifting, able to demonstrate appropraitely   Status Partially Met               Plan - 11/13/14 1509    Clinical Impression Statement Reviewed goals and completed MMT due to pt stated this would be her last session prior therapy next week.  AROM WFL, strength 5/5 except for glut max strengthe.  Pt able to demonstrate appropraite mechanics with min cueing to increase knee flexion with proper lifting, able to lift 25# from floor to waist.  Pt able to demonstrate appropriate technique with all exercises and given an advance HEP to continue at home.  Improved perceived functional abilities on FOTO score.     PT Next Visit Plan D/C  to HEP per goals met and pt. request.        Problem List Patient Active Problem List   Diagnosis Date Noted  . Malignant neoplasm of upper outer quadrant of female breast 10/11/2014  . S/P total knee replacement 08/08/2014  . Difficulty in walking(719.7) 02/15/2014  . Postoperative stiffness of total knee replacement 02/15/2014  . Pain in joint, lower leg 02/15/2014  . Abnormality of gait 10/11/2013  . S/P arthroscopy of right knee 10/09/2013  . Radicular pain of right lower extremity 07/25/2013  . Knee instability 07/25/2013  . Right knee pain 07/25/2013  . Chest pain 04/25/2013  . Patellar tendonitis 09/07/2012  . OA (osteoarthritis) of knee 02/09/2012  . GERD (gastroesophageal reflux disease) 12/01/2011  . Dysphagia 12/01/2011  . RUQ pain 12/01/2011  . Fatty liver 12/01/2011  . Hematochezia 12/01/2011  . INTERMITTENT VERTIGO 09/12/2010  . KNEE, ARTHRITIS, DEGEN./OSTEO 04/03/2009  . CHEST PAIN, RECURRENT 03/07/2009  . OTHER DYSPHAGIA 03/07/2009  . PALPITATIONS 02/06/2009  . LIVER FUNCTION TESTS, ABNORMAL, HX OF 01/01/2009  . MORBID OBESITY 12/31/2008  . PUD 12/31/2008  . BACK PAIN 12/24/2008  . ANSERINE BURSITIS 12/24/2008  . BENIGN POSITIONAL VERTIGO 07/16/2008  . H N P-LUMBAR 04/02/2008  . SPINAL STENOSIS, CERVICAL 02/15/2008  . SPINAL STENOSIS, LUMBAR 02/15/2008  . Patellar tendinitis 10/19/2007  . TEAR LATERAL MENISCUS 06/13/2007  . OBESITY  NOS 10/29/2006  . ALLERGIC RHINITIS, SEASONAL 10/29/2006  . HYPOTHYROIDISM 09/07/2006  . HYPERLIPIDEMIA 09/07/2006  . HYPERTENSION 09/07/2006  . GERD 09/07/2006  . DIVERTICULOSIS, COLON 09/07/2006  . OVERACTIVE BLADDER 09/07/2006  . FIBROCYSTIC BREAST DISEASE 09/07/2006  . OSTEOARTHRITIS 09/07/2006   Ihor Austin, [place]  Aldona Lento 03-Dec-2014, 3:25 PM  [place] [address] [place], [place], [address] Phone: [phone]   Fax:   [phone]           G-Codes - 12-03-2014 1938    Functional Assessment Tool Used FOTO 19% limited   Functional Limitation Mobility: Walking and moving around   Mobility: Walking and Moving Around Discharge Status [phone]) At least 1 percent but less than 20 percent impaired, limited or restricted     PHYSICAL THERAPY DISCHARGE SUMMARY  Visits from Start of Care: 24  Current functional level related to goals / functional outcomes: See above   Remaining deficits: See above    Plan: Patient agrees to discharge.  Patient goals were met. Patient is being discharged due to meeting the stated rehab goals.  ?????       Devona Konig PT DPT [phone]

## 2014-11-13 NOTE — Progress Notes (Signed)
Anesthesia Chart Review:  Pt is 69 year old female scheduled for R simple mastectomy with axillary sentinel lymph node biopsy and R breast reconstruction on 11/20/2014 with Dr. Brantley Stage and Dr. Harlow Mares.   PMH includes: HTN, hyperlipidemia, OSA, hypothyroidism, GERD, breast cancer. S/p L TKA 08/08/2014, s/p R TKA 01/24/2014, s/p anterior lumbar fusion 05/13/2012. Former smoker. BMI 35.   Preoperative labs reviewed.    EKG 08/02/2014: NSR. Biatrial enlargement. Cannot rule out Anterior infarct, age undetermined  Echo 05/03/2013: - Left ventricle: The cavity size was normal. Wall thickness was normal. Systolic function was normal. The estimated ejection fraction was in the range of 55% to 60%. Wall motion was normal; there were no regional wall motion abnormalities. Doppler parameters are consistent with abnormal left ventricular relaxation (grade 1 diastolic dysfunction). Borderline increased filling pressures. - Left atrium: The atrium was at the upper limits of normal in size. - Tricuspid valve: Peak RV-RA gradient: 33mm Hg (S). - Inferior vena cava: Not visualized. Unable to estimate CVP. - Pericardium, extracardiac: A trivial pericardial effusion was identified.  Stress test 05/03/2013: Low risk Lexiscan Myoview. There were no diagnostic ST-segment abnormalities. Perfusion imaging is consistent with mild soft tissue attenuation, no scar or ischemia. Normal LV size with LVEF 81% and normal T I D ratio.  If no changes, I anticipate pt can proceed with surgery as scheduled.   Willeen Cass, FNP-BC [facility]/Anesthesiology Phone: [phone] 11/13/2014 3:08 PM

## 2014-11-16 ENCOUNTER — Encounter (HOSPITAL_COMMUNITY): Payer: Self-pay | Admitting: Hematology & Oncology

## 2014-11-19 MED ORDER — CEFAZOLIN SODIUM-DEXTROSE 2-3 GM-% IV SOLR: 2.0000 g | INTRAVENOUS | Status: DC

## 2014-11-19 MED ORDER — CEFAZOLIN SODIUM-DEXTROSE 2-3 GM-% IV SOLR
2.0000 g | INTRAVENOUS | Status: AC
  Administered 2014-11-20 (×2): 2 g via INTRAVENOUS
  Filled 2014-11-19: qty 50

## 2014-11-19 MED ORDER — HEPARIN SODIUM (PORCINE) 5000 UNIT/ML IJ SOLN
5000.0000 [IU] | Freq: Once | INTRAMUSCULAR | Status: AC
  Administered 2014-11-20: 5000 [IU] via SUBCUTANEOUS
  Filled 2014-11-19: qty 1

## 2014-11-20 ENCOUNTER — Inpatient Hospital Stay (HOSPITAL_COMMUNITY)
Admission: RE | Admit: 2014-11-20 | Discharge: 2014-11-22 | DRG: 578 | Disposition: A | Discharge status: Home Health | Payer: Medicare Other | Source: Ambulatory Visit | Attending: Plastic Surgery | Admitting: Plastic Surgery

## 2014-11-20 ENCOUNTER — Encounter (HOSPITAL_COMMUNITY)
Admission: RE | Disposition: A | Discharge status: Home Health | Payer: Self-pay | Source: Ambulatory Visit | Attending: Plastic Surgery

## 2014-11-20 ENCOUNTER — Encounter (HOSPITAL_COMMUNITY)
Admission: RE | Admit: 2014-11-20 | Discharge: 2014-11-20 | Disposition: A | Discharge status: Home / Self Care | Payer: Medicare Other | Source: Ambulatory Visit | Attending: Surgery | Admitting: Surgery

## 2014-11-20 ENCOUNTER — Encounter (HOSPITAL_COMMUNITY): Payer: Self-pay | Admitting: *Deleted

## 2014-11-20 ENCOUNTER — Ambulatory Visit (HOSPITAL_COMMUNITY): Payer: Medicare Other | Admitting: Physical Therapy

## 2014-11-20 ENCOUNTER — Ambulatory Visit (HOSPITAL_COMMUNITY): Payer: Medicare Other | Admitting: Anesthesiology

## 2014-11-20 ENCOUNTER — Ambulatory Visit (HOSPITAL_COMMUNITY): Payer: Medicare Other | Admitting: Emergency Medicine

## 2014-11-20 DIAGNOSIS — D0591 Unspecified type of carcinoma in situ of right breast: Secondary | ICD-10-CM | POA: Diagnosis not present

## 2014-11-20 DIAGNOSIS — C50911 Malignant neoplasm of unspecified site of right female breast: Secondary | ICD-10-CM | POA: Diagnosis present

## 2014-11-20 DIAGNOSIS — D0511 Intraductal carcinoma in situ of right breast: Principal | ICD-10-CM

## 2014-11-20 DIAGNOSIS — G8918 Other acute postprocedural pain: Secondary | ICD-10-CM | POA: Diagnosis not present

## 2014-11-20 DIAGNOSIS — C50919 Malignant neoplasm of unspecified site of unspecified female breast: Secondary | ICD-10-CM | POA: Diagnosis present

## 2014-11-20 HISTORY — DX: Unspecified osteoarthritis, unspecified site: M19.90

## 2014-11-20 HISTORY — PX: MASTECTOMY COMPLETE / SIMPLE W/ SENTINEL NODE BIOPSY: SUR846

## 2014-11-20 HISTORY — DX: Anxiety disorder, unspecified: F41.9

## 2014-11-20 HISTORY — DX: Other chronic pain: G89.29

## 2014-11-20 HISTORY — DX: Major depressive disorder, single episode, unspecified: F32.9

## 2014-11-20 HISTORY — DX: Malignant neoplasm of unspecified site of right female breast: C50.911

## 2014-11-20 HISTORY — DX: Low back pain: M54.5

## 2014-11-20 HISTORY — DX: Thyrotoxicosis with diffuse goiter without thyrotoxic crisis or storm: E05.00

## 2014-11-20 HISTORY — PX: SIMPLE MASTECTOMY WITH AXILLARY SENTINEL NODE BIOPSY: SHX6098

## 2014-11-20 HISTORY — PX: BREAST RECONSTRUCTION WITH PLACEMENT OF TISSUE EXPANDER AND FLEX HD (ACELLULAR HYDRATED DERMIS): SHX6295

## 2014-11-20 HISTORY — DX: Depression, unspecified: F32.A

## 2014-11-20 HISTORY — DX: Low back pain, unspecified: M54.50

## 2014-11-20 SURGERY — SIMPLE MASTECTOMY WITH AXILLARY SENTINEL NODE BIOPSY
Anesthesia: General | Laterality: Right

## 2014-11-20 MED ORDER — POLYETHYLENE GLYCOL 3350 17 G PO PACK: 17.0000 g | PACK | Freq: Every day | ORAL | Status: DC | PRN

## 2014-11-20 MED ORDER — PROPOFOL 10 MG/ML IV BOLUS
INTRAVENOUS | Status: AC
  Filled 2014-11-20: qty 20

## 2014-11-20 MED ORDER — LIDOCAINE HCL (CARDIAC) 20 MG/ML IV SOLN
INTRAVENOUS | Status: DC | PRN
  Administered 2014-11-20: 50 mg via INTRAVENOUS
  Administered 2014-11-20: 80 mg via INTRAVENOUS

## 2014-11-20 MED ORDER — PRAVASTATIN SODIUM 40 MG PO TABS
40.0000 mg | ORAL_TABLET | Freq: Every day | ORAL | Status: DC
  Administered 2014-11-21 – 2014-11-22 (×2): 40 mg via ORAL
  Filled 2014-11-20 (×3): qty 1

## 2014-11-20 MED ORDER — NEOSTIGMINE METHYLSULFATE 10 MG/10ML IV SOLN
INTRAVENOUS | Status: AC
  Filled 2014-11-20: qty 1

## 2014-11-20 MED ORDER — MIDAZOLAM HCL 2 MG/2ML IJ SOLN
INTRAMUSCULAR | Status: AC
  Filled 2014-11-20: qty 2

## 2014-11-20 MED ORDER — FENTANYL CITRATE 0.05 MG/ML IJ SOLN
INTRAMUSCULAR | Status: DC | PRN
  Administered 2014-11-20 (×3): 50 ug via INTRAVENOUS
  Administered 2014-11-20: 100 ug via INTRAVENOUS
  Administered 2014-11-20 (×3): 50 ug via INTRAVENOUS
  Administered 2014-11-20: 100 ug via INTRAVENOUS

## 2014-11-20 MED ORDER — TECHNETIUM TC 99M SULFUR COLLOID FILTERED
1.0000 | Freq: Once | INTRAVENOUS | Status: AC | PRN
  Administered 2014-11-20: 1 via INTRADERMAL

## 2014-11-20 MED ORDER — LIDOCAINE HCL (CARDIAC) 20 MG/ML IV SOLN
INTRAVENOUS | Status: AC
  Filled 2014-11-20: qty 10

## 2014-11-20 MED ORDER — METOCLOPRAMIDE HCL 5 MG/ML IJ SOLN
INTRAMUSCULAR | Status: DC | PRN
  Administered 2014-11-20: 10 mg via INTRAVENOUS

## 2014-11-20 MED ORDER — PANTOPRAZOLE SODIUM 40 MG PO TBEC
40.0000 mg | DELAYED_RELEASE_TABLET | Freq: Every day | ORAL | Status: DC
  Administered 2014-11-21 – 2014-11-22 (×2): 40 mg via ORAL
  Filled 2014-11-20 (×3): qty 1

## 2014-11-20 MED ORDER — VECURONIUM BROMIDE 10 MG IV SOLR
INTRAVENOUS | Status: DC | PRN
  Administered 2014-11-20: 4 mg via INTRAVENOUS
  Administered 2014-11-20 (×2): 2 mg via INTRAVENOUS

## 2014-11-20 MED ORDER — OXYCODONE-ACETAMINOPHEN 5-325 MG PO TABS
1.0000 | ORAL_TABLET | ORAL | Status: DC | PRN
  Administered 2014-11-20: 1 via ORAL
  Administered 2014-11-20 – 2014-11-22 (×5): 2 via ORAL
  Filled 2014-11-20 (×6): qty 2

## 2014-11-20 MED ORDER — ARTIFICIAL TEARS OP OINT
TOPICAL_OINTMENT | OPHTHALMIC | Status: AC
  Filled 2014-11-20: qty 3.5

## 2014-11-20 MED ORDER — ONDANSETRON HCL 4 MG PO TABS: 4.0000 mg | ORAL_TABLET | Freq: Four times a day (QID) | ORAL | Status: DC | PRN

## 2014-11-20 MED ORDER — METOCLOPRAMIDE HCL 5 MG/ML IJ SOLN
INTRAMUSCULAR | Status: AC
  Filled 2014-11-20: qty 2

## 2014-11-20 MED ORDER — EPHEDRINE SULFATE 50 MG/ML IJ SOLN
INTRAMUSCULAR | Status: DC | PRN
  Administered 2014-11-20 (×2): 10 mg via INTRAVENOUS

## 2014-11-20 MED ORDER — LACTATED RINGERS IV SOLN
INTRAVENOUS | Status: DC | PRN
  Administered 2014-11-20 (×3): via INTRAVENOUS

## 2014-11-20 MED ORDER — KCL IN DEXTROSE-NACL 20-5-0.45 MEQ/L-%-% IV SOLN
INTRAVENOUS | Status: DC
  Administered 2014-11-20: 100 mL/h via INTRAVENOUS
  Administered 2014-11-21 (×2): via INTRAVENOUS
  Filled 2014-11-20 (×6): qty 1000

## 2014-11-20 MED ORDER — NEOSTIGMINE METHYLSULFATE 10 MG/10ML IV SOLN
INTRAVENOUS | Status: DC | PRN
  Administered 2014-11-20: 5 mg via INTRAVENOUS

## 2014-11-20 MED ORDER — CEFAZOLIN SODIUM-DEXTROSE 2-3 GM-% IV SOLR
INTRAVENOUS | Status: AC
  Filled 2014-11-20: qty 50

## 2014-11-20 MED ORDER — SCOPOLAMINE 1 MG/3DAYS TD PT72
MEDICATED_PATCH | TRANSDERMAL | Status: AC
  Filled 2014-11-20: qty 1

## 2014-11-20 MED ORDER — MIDAZOLAM HCL 2 MG/2ML IJ SOLN
INTRAMUSCULAR | Status: AC
Start: 2014-11-20 — End: 2014-11-20
  Administered 2014-11-20: 2 mg
  Filled 2014-11-20: qty 2

## 2014-11-20 MED ORDER — DOCUSATE SODIUM 100 MG PO CAPS
100.0000 mg | ORAL_CAPSULE | Freq: Every day | ORAL | Status: DC
  Administered 2014-11-20 – 2014-11-22 (×3): 100 mg via ORAL
  Filled 2014-11-20 (×3): qty 1

## 2014-11-20 MED ORDER — LORAZEPAM 1 MG PO TABS
1.0000 mg | ORAL_TABLET | Freq: Every evening | ORAL | Status: DC | PRN
Start: 2014-11-20 — End: 2014-11-22
  Administered 2014-11-21: 1 mg via ORAL
  Filled 2014-11-20: qty 1

## 2014-11-20 MED ORDER — DEXAMETHASONE SODIUM PHOSPHATE 4 MG/ML IJ SOLN
INTRAMUSCULAR | Status: DC | PRN
  Administered 2014-11-20: 10 mg via INTRAVENOUS

## 2014-11-20 MED ORDER — METOPROLOL TARTRATE 25 MG PO TABS
25.0000 mg | ORAL_TABLET | Freq: Two times a day (BID) | ORAL | Status: DC
  Administered 2014-11-20 – 2014-11-22 (×4): 25 mg via ORAL
  Filled 2014-11-20 (×4): qty 1

## 2014-11-20 MED ORDER — DEXAMETHASONE SODIUM PHOSPHATE 4 MG/ML IJ SOLN
INTRAMUSCULAR | Status: AC
  Filled 2014-11-20: qty 3

## 2014-11-20 MED ORDER — HYDROMORPHONE HCL 1 MG/ML IJ SOLN
1.0000 mg | INTRAMUSCULAR | Status: DC | PRN
  Administered 2014-11-20 – 2014-11-21 (×10): 1 mg via INTRAVENOUS
  Filled 2014-11-20 (×11): qty 1

## 2014-11-20 MED ORDER — FENTANYL CITRATE 0.05 MG/ML IJ SOLN
INTRAMUSCULAR | Status: AC
  Filled 2014-11-20: qty 2

## 2014-11-20 MED ORDER — ONDANSETRON HCL 4 MG/2ML IJ SOLN
INTRAMUSCULAR | Status: DC | PRN
  Administered 2014-11-20 (×2): 4 mg via INTRAVENOUS

## 2014-11-20 MED ORDER — MIDAZOLAM HCL 5 MG/ML IJ SOLN: 2.0000 mg | Freq: Once | INTRAMUSCULAR | Status: DC

## 2014-11-20 MED ORDER — FENTANYL CITRATE 0.05 MG/ML IJ SOLN
INTRAMUSCULAR | Status: AC
Start: 2014-11-20 — End: 2014-11-20
  Filled 2014-11-20: qty 5

## 2014-11-20 MED ORDER — GABAPENTIN 600 MG PO TABS
300.0000 mg | ORAL_TABLET | Freq: Three times a day (TID) | ORAL | Status: DC
  Administered 2014-11-20 – 2014-11-21 (×3): 300 mg via ORAL
  Filled 2014-11-20 (×3): qty 1

## 2014-11-20 MED ORDER — HYDROCHLOROTHIAZIDE 12.5 MG PO CAPS
12.5000 mg | ORAL_CAPSULE | Freq: Every day | ORAL | Status: DC
  Administered 2014-11-21 – 2014-11-22 (×2): 12.5 mg via ORAL
  Filled 2014-11-20 (×2): qty 1

## 2014-11-20 MED ORDER — LOSARTAN POTASSIUM 50 MG PO TABS
100.0000 mg | ORAL_TABLET | Freq: Every day | ORAL | Status: DC
  Administered 2014-11-21 – 2014-11-22 (×2): 100 mg via ORAL
  Filled 2014-11-20 (×2): qty 2

## 2014-11-20 MED ORDER — ROCURONIUM BROMIDE 50 MG/5ML IV SOLN
INTRAVENOUS | Status: AC
  Filled 2014-11-20: qty 1

## 2014-11-20 MED ORDER — PROPOFOL 10 MG/ML IV BOLUS
INTRAVENOUS | Status: DC | PRN
  Administered 2014-11-20: 150 mg via INTRAVENOUS

## 2014-11-20 MED ORDER — KCL IN DEXTROSE-NACL 20-5-0.45 MEQ/L-%-% IV SOLN
INTRAVENOUS | Status: AC
  Administered 2014-11-20: 1000 mL
  Filled 2014-11-20: qty 1000

## 2014-11-20 MED ORDER — VECURONIUM BROMIDE 10 MG IV SOLR
INTRAVENOUS | Status: AC
  Filled 2014-11-20: qty 10

## 2014-11-20 MED ORDER — SODIUM CHLORIDE 0.9 % IV SOLN
Freq: Once | INTRAVENOUS | Status: AC
  Administered 2014-11-20: 13:00:00
  Filled 2014-11-20: qty 1

## 2014-11-20 MED ORDER — GLYCOPYRROLATE 0.2 MG/ML IJ SOLN
INTRAMUSCULAR | Status: AC
  Filled 2014-11-20: qty 3

## 2014-11-20 MED ORDER — CEFAZOLIN SODIUM 1-5 GM-% IV SOLN
1.0000 g | Freq: Four times a day (QID) | INTRAVENOUS | Status: DC
  Administered 2014-11-20 – 2014-11-21 (×5): 1 g via INTRAVENOUS
  Filled 2014-11-20 (×9): qty 50

## 2014-11-20 MED ORDER — ONDANSETRON HCL 4 MG/2ML IJ SOLN
INTRAMUSCULAR | Status: AC
  Filled 2014-11-20: qty 2

## 2014-11-20 MED ORDER — MIDAZOLAM HCL 5 MG/5ML IJ SOLN
INTRAMUSCULAR | Status: DC | PRN
  Administered 2014-11-20: 2 mg via INTRAVENOUS

## 2014-11-20 MED ORDER — DIPHENHYDRAMINE HCL 25 MG PO CAPS: 25.0000 mg | ORAL_CAPSULE | Freq: Every evening | ORAL | Status: DC | PRN

## 2014-11-20 MED ORDER — METHOCARBAMOL 500 MG PO TABS
ORAL_TABLET | ORAL | Status: AC
  Filled 2014-11-20: qty 1

## 2014-11-20 MED ORDER — ONDANSETRON HCL 4 MG/2ML IJ SOLN: 4.0000 mg | Freq: Four times a day (QID) | INTRAMUSCULAR | Status: DC | PRN

## 2014-11-20 MED ORDER — LOSARTAN POTASSIUM-HCTZ 100-12.5 MG PO TABS
1.0000 | ORAL_TABLET | Freq: Every day | ORAL | Status: DC
Start: 2014-11-20 — End: 2014-11-20

## 2014-11-20 MED ORDER — ROCURONIUM BROMIDE 100 MG/10ML IV SOLN
INTRAVENOUS | Status: DC | PRN
  Administered 2014-11-20: 50 mg via INTRAVENOUS

## 2014-11-20 MED ORDER — LACTATED RINGERS IV SOLN
INTRAVENOUS | Status: DC
  Administered 2014-11-20: 10:00:00 via INTRAVENOUS

## 2014-11-20 MED ORDER — HEPARIN SODIUM (PORCINE) 5000 UNIT/ML IJ SOLN
INTRAMUSCULAR | Status: AC
  Filled 2014-11-20: qty 1

## 2014-11-20 MED ORDER — PHENYLEPHRINE 40 MCG/ML (10ML) SYRINGE FOR IV PUSH (FOR BLOOD PRESSURE SUPPORT)
PREFILLED_SYRINGE | INTRAVENOUS | Status: AC
  Filled 2014-11-20: qty 10

## 2014-11-20 MED ORDER — HYDROMORPHONE HCL 1 MG/ML IJ SOLN
INTRAMUSCULAR | Status: AC
  Administered 2014-11-20: 0.5 mg via INTRAVENOUS
  Filled 2014-11-20: qty 2

## 2014-11-20 MED ORDER — FENTANYL CITRATE 0.05 MG/ML IJ SOLN
100.0000 ug | Freq: Once | INTRAMUSCULAR | Status: AC
  Administered 2014-11-20: 100 ug via INTRAVENOUS

## 2014-11-20 MED ORDER — GLYCOPYRROLATE 0.2 MG/ML IJ SOLN
INTRAMUSCULAR | Status: DC | PRN
  Administered 2014-11-20: 0.6 mg via INTRAVENOUS

## 2014-11-20 MED ORDER — ENOXAPARIN SODIUM 40 MG/0.4ML ~~LOC~~ SOLN
40.0000 mg | SUBCUTANEOUS | Status: DC
  Administered 2014-11-21 – 2014-11-22 (×2): 40 mg via SUBCUTANEOUS
  Filled 2014-11-20 (×2): qty 0.4

## 2014-11-20 MED ORDER — STERILE WATER FOR INJECTION IJ SOLN
INTRAMUSCULAR | Status: AC
  Filled 2014-11-20: qty 10

## 2014-11-20 MED ORDER — ROPIVACAINE HCL 5 MG/ML IJ SOLN
INTRAMUSCULAR | Status: DC | PRN
  Administered 2014-11-20: 25 mL via PERINEURAL

## 2014-11-20 MED ORDER — PROMETHAZINE HCL 25 MG/ML IJ SOLN: 6.2500 mg | INTRAMUSCULAR | Status: DC | PRN

## 2014-11-20 MED ORDER — SODIUM CHLORIDE 0.9 % IJ SOLN
INTRAMUSCULAR | Status: AC
  Filled 2014-11-20: qty 10

## 2014-11-20 MED ORDER — METHYLENE BLUE 1 % INJ SOLN
INTRAMUSCULAR | Status: AC
  Filled 2014-11-20: qty 10

## 2014-11-20 MED ORDER — LEVOTHYROXINE SODIUM 88 MCG PO TABS
88.0000 ug | ORAL_TABLET | Freq: Every day | ORAL | Status: DC
  Administered 2014-11-21 – 2014-11-22 (×2): 88 ug via ORAL
  Filled 2014-11-20 (×2): qty 1

## 2014-11-20 MED ORDER — 0.9 % SODIUM CHLORIDE (POUR BTL) OPTIME
TOPICAL | Status: DC | PRN
  Administered 2014-11-20 (×2): 1000 mL

## 2014-11-20 MED ORDER — HYDROMORPHONE HCL 1 MG/ML IJ SOLN
0.2500 mg | INTRAMUSCULAR | Status: DC | PRN
  Administered 2014-11-20 (×4): 0.5 mg via INTRAVENOUS

## 2014-11-20 MED ORDER — OXYCODONE-ACETAMINOPHEN 5-325 MG PO TABS
ORAL_TABLET | ORAL | Status: AC
  Filled 2014-11-20: qty 1

## 2014-11-20 MED ORDER — ARTIFICIAL TEARS OP OINT
TOPICAL_OINTMENT | OPHTHALMIC | Status: DC | PRN
  Administered 2014-11-20: 1 via OPHTHALMIC

## 2014-11-20 MED ORDER — METHOCARBAMOL 500 MG PO TABS
500.0000 mg | ORAL_TABLET | Freq: Four times a day (QID) | ORAL | Status: DC | PRN
  Administered 2014-11-20 – 2014-11-22 (×7): 500 mg via ORAL
  Filled 2014-11-20 (×7): qty 1

## 2014-11-20 SURGICAL SUPPLY — 74 items
ADH SKN CLS APL DERMABOND .7 (GAUZE/BANDAGES/DRESSINGS) ×1
APPLIER CLIP 9.375 MED OPEN (MISCELLANEOUS) ×2
APR CLP MED 9.3 20 MLT OPN (MISCELLANEOUS) ×1
ATCH SMKEVC FLXB CAUT HNDSWH (FILTER) ×1 IMPLANT
BAG DECANTER FOR FLEXI CONT (MISCELLANEOUS) ×2 IMPLANT
BINDER BREAST LRG (GAUZE/BANDAGES/DRESSINGS) IMPLANT
BINDER BREAST XLRG (GAUZE/BANDAGES/DRESSINGS) ×1 IMPLANT
BINDER BREAST XXLRG (GAUZE/BANDAGES/DRESSINGS) ×1 IMPLANT
BIOPATCH RED 1 DISK 7.0 (GAUZE/BANDAGES/DRESSINGS) ×4 IMPLANT
BLADE 10 SAFETY STRL DISP (BLADE) ×2 IMPLANT
CANISTER SUCTION 2500CC (MISCELLANEOUS) ×4 IMPLANT
CHLORAPREP W/TINT 26ML (MISCELLANEOUS) ×4 IMPLANT
CLIP APPLIE 9.375 MED OPEN (MISCELLANEOUS) ×1 IMPLANT
CONT SPEC 4OZ CLIKSEAL STRL BL (MISCELLANEOUS) ×2 IMPLANT
COVER PROBE W GEL 5X96 (DRAPES) ×2 IMPLANT
COVER SURGICAL LIGHT HANDLE (MISCELLANEOUS) ×4 IMPLANT
DERMABOND ADVANCED (GAUZE/BANDAGES/DRESSINGS) ×1
DERMABOND ADVANCED .7 DNX12 (GAUZE/BANDAGES/DRESSINGS) ×1 IMPLANT
DEVICE DISSECT PLASMABLAD 3.0S (MISCELLANEOUS) IMPLANT
DRAIN CHANNEL 19F RND (DRAIN) ×5 IMPLANT
DRAPE CHEST BREAST 15X10 FENES (DRAPES) ×2 IMPLANT
DRAPE ORTHO SPLIT 77X108 STRL (DRAPES) ×4
DRAPE PROXIMA HALF (DRAPES) ×6 IMPLANT
DRAPE SURG 17X23 STRL (DRAPES) ×4 IMPLANT
DRAPE SURG ORHT 6 SPLT 77X108 (DRAPES) ×2 IMPLANT
DRAPE UTILITY XL STRL (DRAPES) ×4 IMPLANT
DRAPE WARM FLUID 44X44 (DRAPE) ×2 IMPLANT
DRSG PAD ABDOMINAL 8X10 ST (GAUZE/BANDAGES/DRESSINGS) ×4 IMPLANT
DRSG SORBAVIEW 3.5X5-5/16 MED (GAUZE/BANDAGES/DRESSINGS) ×4 IMPLANT
ELECT BLADE 6.5 EXT (BLADE) IMPLANT
ELECT CAUTERY BLADE 6.4 (BLADE) ×4 IMPLANT
ELECT REM PT RETURN 9FT ADLT (ELECTROSURGICAL) ×4
ELECTRODE REM PT RTRN 9FT ADLT (ELECTROSURGICAL) ×2 IMPLANT
EVACUATOR SILICONE 100CC (DRAIN) ×6 IMPLANT
EVACUATOR SMOKE ACCUVAC VALLEY (FILTER) ×1
EXPANDER BREAST CONT 800CC (Breast) ×1 IMPLANT
GLOVE BIO SURGEON STRL SZ7.5 (GLOVE) ×1 IMPLANT
GLOVE BIO SURGEON STRL SZ8 (GLOVE) ×1 IMPLANT
GLOVE BIOGEL PI IND STRL 8 (GLOVE) ×2 IMPLANT
GLOVE BIOGEL PI INDICATOR 8 (GLOVE) ×2
GLOVE SURG SS PI 6.0 STRL IVOR (GLOVE) ×1 IMPLANT
GLOVE SURG SS PI 7.5 STRL IVOR (GLOVE) ×1 IMPLANT
GLOVE SURG SS PI 8.0 STRL IVOR (GLOVE) ×1 IMPLANT
GOWN STRL REUS W/ TWL LRG LVL3 (GOWN DISPOSABLE) ×4 IMPLANT
GOWN STRL REUS W/ TWL XL LVL3 (GOWN DISPOSABLE) ×2 IMPLANT
GOWN STRL REUS W/TWL LRG LVL3 (GOWN DISPOSABLE) ×8
GOWN STRL REUS W/TWL XL LVL3 (GOWN DISPOSABLE) ×4
GRAFT FLEX HD 4X16 THICK (Tissue Mesh) ×1 IMPLANT
KIT BASIN OR (CUSTOM PROCEDURE TRAY) ×4 IMPLANT
KIT ROOM TURNOVER OR (KITS) ×4 IMPLANT
LIQUID BAND (GAUZE/BANDAGES/DRESSINGS) ×3 IMPLANT
MARKER SKIN DUAL TIP RULER LAB (MISCELLANEOUS) ×2 IMPLANT
NDL 18GX1X1/2 (RX/OR ONLY) (NEEDLE) IMPLANT
NDL HYPO 25GX1X1/2 BEV (NEEDLE) IMPLANT
NEEDLE 18GX1X1/2 (RX/OR ONLY) (NEEDLE) IMPLANT
NEEDLE HYPO 25GX1X1/2 BEV (NEEDLE) IMPLANT
NS IRRIG 1000ML POUR BTL (IV SOLUTION) ×6 IMPLANT
PACK GENERAL/GYN (CUSTOM PROCEDURE TRAY) ×4 IMPLANT
PAD ARMBOARD 7.5X6 YLW CONV (MISCELLANEOUS) ×4 IMPLANT
PLASMABLADE 3.0S (MISCELLANEOUS) ×2
PREFILTER EVAC NS 1 1/3-3/8IN (MISCELLANEOUS) ×2 IMPLANT
SPECIMEN JAR X LARGE (MISCELLANEOUS) ×2 IMPLANT
SUT ETHILON 3 0 FSL (SUTURE) ×2 IMPLANT
SUT MNCRL AB 3-0 PS2 18 (SUTURE) ×8 IMPLANT
SUT MNCRL AB 4-0 PS2 18 (SUTURE) ×2 IMPLANT
SUT PDS AB 3-0 SH 27 (SUTURE) ×2 IMPLANT
SUT PROLENE 3 0 PS 2 (SUTURE) ×4 IMPLANT
SUT VIC AB 3-0 SH 18 (SUTURE) ×4 IMPLANT
SYR BULB IRRIGATION 50ML (SYRINGE) ×2 IMPLANT
SYR CONTROL 10ML LL (SYRINGE) IMPLANT
TOWEL OR 17X24 6PK STRL BLUE (TOWEL DISPOSABLE) ×4 IMPLANT
TOWEL OR 17X26 10 PK STRL BLUE (TOWEL DISPOSABLE) ×4 IMPLANT
TRAY FOLEY CATH 14FRSI W/METER (CATHETERS) IMPLANT
TUBE CONNECTING 12X1/4 (SUCTIONS) ×3 IMPLANT

## 2014-11-20 NOTE — Transfer of Care (Signed)
Immediate Anesthesia Transfer of Care Note  Patient: Sara [name]  Procedure(s) Performed: Procedure(s): SIMPLE MASTECTOMY WITH AXILLARY SENTINEL NODE BIOPSY (Right) RIGHT BREAST RECONSTRUCTION WITH PLACEMENT OF TISSUE EXPANDER AND ACELLULAR DERMA MATRIX ([place]) (Right)  Patient Location: PACU  Anesthesia Type:GA combined with regional for post-op pain  Level of Consciousness: awake, oriented, sedated, patient cooperative and responds to stimulation  Airway & Oxygen Therapy: Patient Spontanous Breathing and Patient connected to nasal cannula oxygen  Post-op Assessment: Report given to RN, Post -op Vital signs reviewed and stable, Patient moving all extremities and Patient moving all extremities X 4  Post vital signs: Reviewed and stable  Last Vitals:  Filed Vitals:   11/20/14 1050  BP:   Pulse: 63  Temp:   Resp: 15    Complications: No apparent anesthesia complications

## 2014-11-20 NOTE — Op Note (Signed)
Sara [name] [birth year] [id number] 11/20/2014   Preoperative diagnosis: right breast DCIS extensive   Postoperative diagnosis: same  Procedure: right simple mastectomy and SLN  mapping  Surgeon: Ilah Boule A.  Pensions consultant: none  Anesthesia:GA combined with regional for post-op pain  Clinical History and Indications:The patient is seen in the holding area and we reviewed the plans for the procedure as noted above. We reviewed the risks and complications a final time. She had no further questions. I marked the right side as the operative side. Nuclear medicine injected her right breast for mapping.   Description of Procedure: The patient was taken to the operating room. After satisfactory general anesthesia was obtained the timeout was done.   A full prep and drape was then done.  I outlined an elliptical incision and marked the inframammary fold and midline. The incision was made. The usual skin flaps were raised. I went to the clavicle superiorly and sternum medially and towards the latissimus laterally.    After the superior flap was complete I was able to use the neoprobe to locate a sentinel node.  4 hot nodes were identified and removed and sent to pathology.    Once the nodes were  removed it was forwarded to pathology.   The inferior flap was then made going to the inframammary fold and out to the latissimus. The breast was then removed from the pectoralis starting medially and working laterally. When I got to the lateral aspect of the pectoralis major muscle I opened the clavipectoral fascia. I finished removing the breast from the serratus muscles.    I therefore completed the mastectomy by dividing the remaining attachments from the breast to the chest wall and latissimus but not taking any more tissue from the axilla. The specimen was marked to orient it for the pathologist and passed off the table.  I spent several minutes irrigating and making sure  everything was dry.  Dr Harlow Mares scrubbed in to begin the reconstruction part of the procedure.   The patient tolerated the procedure well. There were no operative complications. All counts were correct. Estimated blood loss was 100 cc.  Erroll Luna , MD, FACS 11/20/2014 12:43 PM

## 2014-11-20 NOTE — Brief Op Note (Signed)
11/20/2014  2:58 PM  PATIENT:  Sara [name]  69 y.o. female  PRE-OPERATIVE DIAGNOSIS:  Right Breast DCIS  POST-OPERATIVE DIAGNOSIS:  Right Breast DCIS  PROCEDURE:  Procedure(s): SIMPLE MASTECTOMY WITH AXILLARY SENTINEL NODE BIOPSY (Right) RIGHT BREAST RECONSTRUCTION WITH PLACEMENT OF TISSUE EXPANDER AND ACELLULAR DERMA MATRIX (ACELLULAR [place]) (Right)  SURGEON:  Surgeon(s) and Role: Panel 1:    * Erroll Luna, MD - Primary  Panel 2:    * Crissie Reese, MD - Primary  PHYSICIAN ASSISTANT:   ASSISTANTS: none   ANESTHESIA:   general  EBL:  Total I/O In: 2000 [I.V.:2000] Out: 890 [Urine:690; Blood:200]  BLOOD ADMINISTERED:none  DRAINS: (2) Jackson-Pratt drain(s) with closed bulb suction in the right mastectomy space   LOCAL MEDICATIONS USED:  NONE  SPECIMEN:  No Specimen  DISPOSITION OF SPECIMEN:  N/A  COUNTS:  YES  TOURNIQUET:  * No tourniquets in log *  DICTATION: .Other Dictation: Dictation Number [id number]  PLAN OF CARE: Admit to inpatient   PATIENT DISPOSITION:  PACU - hemodynamically stable.   Delay start of Pharmacological VTE agent (>24hrs) due to surgical blood loss or risk of bleeding: no

## 2014-11-20 NOTE — H&P (View-Only) (Signed)
Sara [name] 10/22/2014 9:21 AM Location: [facility] Patient #: [id number] DOB: 1946-02-24 Married / Language: English / Race: Black or African American Female History of Present Illness Sara [name]; 10/22/2014 10:38 AM) Patient words: discuss breast sx   pt here for follow up of DCIS right breast. MRI showed a much larger area and biopsy of posterior extent showed DCIS. Pt on for lumpectomy prior to MRI but will need mastectomy to cleear area. No complaints today.    ADDENDUM REPORT: 10/09/2014 15:12 ADDENDUM: The IMPRESSION should: 8 x 12 x 8 CM abnormal linear clumped enhancement throughout the majority of the central and upper right breast. Electronically Signed By: Sara [name] M.D. On: 10/09/2014 15:12   Study Result CLINICAL DATA: 69 year old female with newly diagnosed right breast DCIS. LABS: BUN and creatinine were obtained on site at [place] at [address] Results: BUN 0.8 mg/dL, Creatinine 8 mg/dL. EXAM: BILATERAL BREAST MRI WITH AND WITHOUT CONTRAST TECHNIQUE: Multiplanar, multisequence MR images of both breasts were obtained prior to and following the intravenous administration of 2ml of MultiHance. THREE-DIMENSIONAL MR IMAGE RENDERING ON INDEPENDENT WORKSTATION: Three-dimensional MR images were rendered by post-processing of the original MR data on an independent workstation. The three-dimensional MR images were interpreted, and findings are reported in the following complete MRI report for this study. Three dimensional images were evaluated at the independent DynaCad workstation COMPARISON: Previous mammograms and ultrasounds. FINDINGS: Breast composition: c: Heterogeneous fibroglandular tissue Background parenchymal enhancement: Mild Right breast: There is abnormal linear and clumped enhancement (rapid washout and plateau type kinetics) throughout the majority of the central and upper right breast  extending from just underneath the skin in the upper right breast to the posterior third. The entire area of abnormal enhancement measures 8 x 12 x 8 cm (transverse x AP x CC). Biopsy clip artifact within the anterior upper aspect of this abnormal enhancement is noted compatible with biopsy-proven DCIS in this area. Left breast: No mass or abnormal enhancement. Lymph nodes: A prominent 2.5 cm level 1 right axillary lymph node is identified. Ancillary findings: None. IMPRESSION: 8 x 12 x 8 mm abnormal linear and clumped enhancement throughout the majority of the central and upper right breast, with biopsy clip artifact in the anterior aspect of this enhancement. This entire area is suspicious for DCIS and recommend tissue sampling of the posterior extent if breast conservation will be considered. Prominent level 1 right axillary lymph node. Consider second-look ultrasound and biopsy. No MR evidence of left breast malignancy. RECOMMENDATION: MR guided biopsy of the posterior extent of abnormal right breast enhancement if breast soft conservation is considered. Consider second local ultrasound of the right axilla and possible biopsy. BI-RADS CATEGORY 5: Highly suggestive of malignancy. Electronically Signed: By: Sara [name] M.D. On: 10/09/2014 12:11      Diagnosis Breast, right, needle core biopsy, central - DUCTAL CARCINOMA IN SITU, MIXED PATTERNS (CRIBRIFORM, COMEDO AND PAPILLARY), INVOLVING MULTIPLE CORES, AT LEAST 9 MM IN MAXIMAL EXTENT IN ANY ONE CORE. - FOCAL LOBULAR NEOPLASIA (ALH/LCIS). - NO INVASIVE CARCINOMA IDENTIFIED. - ER AND PR HAVE BEEN PERFORMED ON A RECENT NEEDLE CORE BIOPSY WITH DUCTAL CARCINOMA IN SITU ([id number], 09-26-2014). - SEE COMMENT. ADDITIONAL INFORMATION: PROGNOSTIC INDICATORS - ACIS Results: IMMUNOHISTOCHEMICAL AND MORPHOMETRIC ANALYSIS BY THE AUTOMATED CELLULAR IMAGING SYSTEM (ACIS) Estrogen Receptor: 100%, POSITIVE, STRONG STAINING  INTENSITY Progesterone Receptor: 90%, POSITIVE, STRONG STAINING INTENSITY REFERENCE RANGE ESTROGEN RECEPTOR NEGATIVE <1% POSITIVE =>1% PROGESTERONE RECEPTOR NEGATIVE <1% POSITIVE =>1% All controls stained  appropriately Sara Cutter [name] Pathologist, Electronic Signature ( Signed 09/28/2014) FINAL DIAGNOSIS Diagnosis Breast, right, needle core biopsy, 11-12 o'clock, 2 cm from nipple - 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 Microscopic Comment There is intermediate grade ductal carcinoma in situ with focal necrosis. Estrogen and progesterone will be performed. 1 of 2 FINAL for Sara [name], Sara [name] ([id number]) Microscopic Comment(continued) Drm nipple Specimen Clinical Information 7 mm.  The patient is a 69 year old female   Allergies Sara [name], Sara [name]; 10/22/2014 9:22 AM) No Known Drug Allergies 10/01/2014  Medication History (Sara [name], Sara [name]; 10/22/2014 9:23 AM) DiphenhydrAMINE HCl (25MG  Capsule, Oral) Active. Flonase Allergy Relief (50MCG/ACT Suspension, Nasal) Active. Gabapentin (600MG  Tablet, Oral) Active. Levothyroxine Sodium (88MCG Tablet, Oral) Active. LORazepam (1MG  Tablet, Oral) Active. Losartan Potassium-HCTZ (100-12.5MG  Tablet, Oral) Active. Metoprolol Tartrate (25MG  Tablet, Oral) Active. Omeprazole (40MG  Capsule DR, Oral) Active. Oxycodone-Acetaminophen (10-325MG  Tablet, Oral) Active. Pravastatin Sodium (40MG  Tablet, Oral) Active. Polyethylene Glycol 300 (as needed) Active. Oxycodone-Acetaminophen (7.5-325MG  Tablet, Oral as needed) Active.    Vitals (Sara [name] Sara [name]; 10/22/2014 9:22 AM) 10/22/2014 9:21 AM Weight: 216 lb Height: 66in Body Surface Area: 2.14 m Body Mass Index: 34.86 kg/m Temp.: 97.89F(Temporal)  Pulse: 66 (Regular)  BP: 118/80 (Sitting, Left Arm, Standard)     Physical Exam (Sara Augusta A. Xena Propst [name]; 10/22/2014 10:39 AM)  General Mental Status-Alert. General Appearance-Consistent with stated  age. Hydration-Well hydrated. Voice-Normal.  Head and Neck Head-normocephalic, atraumatic with no lesions or palpable masses. Trachea-midline. Thyroid Gland Characteristics - normal size and consistency.  Eye Eyeball - Bilateral-Extraocular movements intact. Sclera/Conjunctiva - Bilateral-No scleral icterus.  Chest and Lung Exam Chest and lung exam reveals -quiet, even and easy respiratory effort with no use of accessory muscles and on auscultation, normal breath sounds, no adventitious sounds and normal vocal resonance. Inspection Chest Wall - Normal. Back - normal.  Breast Note: not re examined today   Cardiovascular Cardiovascular examination reveals -normal heart sounds, regular rate and rhythm with no murmurs and normal pedal pulses bilaterally.  Musculoskeletal Normal Exam - Left-Upper Extremity Strength Normal and Lower Extremity Strength Normal. Normal Exam - Right-Upper Extremity Strength Normal and Lower Extremity Strength Normal.  Lymphatic Note: not re examoned today     Assessment & Plan (Eufemia Prindle A. Gennell How [name]; 10/22/2014 10:40 AM)  DCIS (DUCTAL CARCINOMA IN SITU), RIGHT (233.0  D05.11) Impression: MRI shows area to be 12 cm in diameter and second deeper biopsy shows DCIS. Recommend right simple mastectomy and SLN mapping with reconstruction. Pt will see plastic surgery. Will cancel lumpetomy and SLN. Discussed with scheduling today. Discussed treatment options for breast cancer to include breast conservation vs mastectomy with reconstruction. Pt has decided on mastectomy. Risk include bleeding, infection, flap necrosis, pain, numbness, recurrence, hematoma, other surgery needs. Pt understands and agrees to proceed. Risk of sentinel lymph node mapping include bleeding, infection, lymphedema, shoulder pain. stiffness, dye allergy. cosmetic deformity , blood clots, death, need for more surgery. Pt agres to proceed.  Current Plans Pt Education  - [facility] Mastectomy HCI

## 2014-11-20 NOTE — Anesthesia Preprocedure Evaluation (Signed)
Anesthesia Evaluation  Patient identified by MRN, date of birth, ID band Patient awake    Reviewed: Allergy & Precautions, NPO status , Patient's Chart, lab work & pertinent test results  Airway Mallampati: II  TM Distance: >3 FB Neck ROM: Full    Dental no notable dental hx.    Pulmonary sleep apnea , former smoker,  breath sounds clear to auscultation  Pulmonary exam normal       Cardiovascular hypertension, Pt. on medications Rhythm:Regular Rate:Normal     Neuro/Psych negative neurological ROS  negative psych ROS   GI/Hepatic negative GI ROS, Neg liver ROS,   Endo/Other  Hypothyroidism Hyperthyroidism   Renal/GU negative Renal ROS  negative genitourinary   Musculoskeletal negative musculoskeletal ROS (+)   Abdominal   Peds negative pediatric ROS (+)  Hematology negative hematology ROS (+)   Anesthesia Other Findings   Reproductive/Obstetrics negative OB ROS                             Anesthesia Physical Anesthesia Plan  ASA: III  Anesthesia Plan: General   Post-op Pain Management:    Induction: Intravenous  Airway Management Planned: Oral ETT and LMA  Additional Equipment:   Intra-op Plan:   Post-operative Plan: Extubation in OR  Informed Consent: I have reviewed the patients History and Physical, chart, labs and discussed the procedure including the risks, benefits and alternatives for the proposed anesthesia with the patient or authorized representative who has indicated his/her understanding and acceptance.   Dental advisory given  Plan Discussed with: CRNA and Surgeon  Anesthesia Plan Comments:         Anesthesia Quick Evaluation

## 2014-11-20 NOTE — Anesthesia Postprocedure Evaluation (Signed)
  Anesthesia Post-op Note  Patient: Sara [name]  Procedure(s) Performed: Procedure(s) (LRB): SIMPLE MASTECTOMY WITH AXILLARY SENTINEL NODE BIOPSY (Right) RIGHT BREAST RECONSTRUCTION WITH PLACEMENT OF TISSUE EXPANDER AND ACELLULAR DERMA MATRIX (ACELLULAR [place]) (Right)  Patient Location: PACU  Anesthesia Type: General  Level of Consciousness: awake and alert   Airway and Oxygen Therapy: Patient Spontanous Breathing  Post-op Pain: mild  Post-op Assessment: Post-op Vital signs reviewed, Patient's Cardiovascular Status Stable, Respiratory Function Stable, Patent Airway and No signs of Nausea or vomiting  Last Vitals:  Filed Vitals:   11/20/14 1520  BP: 123/57  Pulse: 63  Temp: 36.5 C  Resp: 10    Post-op Vital Signs: stable   Complications: No apparent anesthesia complications

## 2014-11-20 NOTE — Interval H&P Note (Signed)
History and Physical Interval Note:  11/20/2014 10:13 AM  Sara [name]  has presented today for surgery, with the diagnosis of Right Breat DCIS  The various methods of treatment have been discussed with the patient and family. After consideration of risks, benefits and other options for treatment, the patient has consented to  Procedure(s): SIMPLE MASTECTOMY WITH AXILLARY SENTINEL NODE BIOPSY (Right) RIGHT BREAST RECONSTRUCTION WITH PLACEMENT OF TISSUE EXPANDER AND ACELLULAR DERMA MATRIX ([place]) (Right) as a surgical intervention .  The patient's history has been reviewed, patient examined, no change in status, stable for surgery.  I have reviewed the patient's chart and labs.  Questions were answered to the patient's satisfaction.     [name]

## 2014-11-20 NOTE — Anesthesia Procedure Notes (Addendum)
Anesthesia Regional Block:  Pectoralis block  Pre-Anesthetic Checklist: ,, timeout performed, Correct Patient, Correct Site, Correct Laterality, Correct Procedure, Correct Position, site marked, Risks and benefits discussed,  Surgical consent,  Pre-op evaluation,  At surgeon's request and post-op pain management  Laterality: Right  Prep: chloraprep       Needles:  Injection technique: Single-shot  Needle Type: Echogenic Needle     Needle Length: 9cm 9 cm Needle Gauge: 21 and 21 G    Additional Needles:  Procedures: ultrasound guided (picture in chart) Pectoralis block Narrative:  Injection made incrementally with aspirations every 5 mL.  Performed by: Personally  Anesthesiologist: ROSE, [name]  Additional Notes: Patient tolerated the procedure well without complications   Procedure Name: Intubation Date/Time: 11/20/2014 11:11 AM Performed by: Jacquiline Doe A Pre-anesthesia Checklist: Patient identified, Timeout performed, Emergency Drugs available, Suction available and Patient being monitored Patient Re-evaluated:Patient Re-evaluated prior to inductionOxygen Delivery Method: Circle system utilized Preoxygenation: Pre-oxygenation with 100% oxygen Intubation Type: IV induction and Cricoid Pressure applied Ventilation: Mask ventilation without difficulty Laryngoscope Size: Mac and 4 Grade View: Grade I Tube type: Oral Tube size: 7.5 mm Number of attempts: 1 Airway Equipment and Method: Stylet Placement Confirmation: ETT inserted through vocal cords under direct vision,  breath sounds checked- equal and bilateral and positive ETCO2 Secured at: 22 cm Tube secured with: Tape Dental Injury: Teeth and Oropharynx as per pre-operative assessment

## 2014-11-21 ENCOUNTER — Encounter (HOSPITAL_COMMUNITY): Payer: Self-pay | Admitting: Surgery

## 2014-11-21 LAB — CBC
HCT: 37.4 % (ref 36.0–46.0)
Hemoglobin: 12.3 g/dL (ref 12.0–15.0)
MCH: 30.3 pg (ref 26.0–34.0)
MCHC: 32.9 g/dL (ref 30.0–36.0)
MCV: 92.1 fL (ref 78.0–100.0)
Platelets: 216 10*3/uL (ref 150–400)
RBC: 4.06 MIL/uL (ref 3.87–5.11)
RDW: 14.4 % (ref 11.5–15.5)
WBC: 14.2 10*3/uL — ABNORMAL HIGH (ref 4.0–10.5)

## 2014-11-21 LAB — BASIC METABOLIC PANEL
Anion gap: 10 (ref 5–15)
BUN: 6 mg/dL (ref 6–23)
CO2: 27 mmol/L (ref 19–32)
Calcium: 8.7 mg/dL (ref 8.4–10.5)
Chloride: 101 mmol/L (ref 96–112)
Creatinine, Ser: 0.84 mg/dL (ref 0.50–1.10)
GFR calc Af Amer: 82 mL/min — ABNORMAL LOW (ref >90)
GFR calc non Af Amer: 70 mL/min — ABNORMAL LOW (ref >90)
Glucose, Bld: 87 mg/dL (ref 70–99)
Potassium: 4.1 mmol/L (ref 3.5–5.1)
Sodium: 138 mmol/L (ref 135–145)

## 2014-11-21 MED ORDER — GABAPENTIN 300 MG PO CAPS
300.0000 mg | ORAL_CAPSULE | Freq: Three times a day (TID) | ORAL | Status: DC
  Administered 2014-11-21 – 2014-11-22 (×2): 300 mg via ORAL
  Filled 2014-11-21 (×2): qty 1

## 2014-11-21 NOTE — Op Note (Signed)
NAMEMarland Kitchen  Sara, [name] NO.:  [id number]  MEDICAL RECORD NO.:  [id number]  LOCATION:                                 FACILITY:  PHYSICIAN:  Crissie Reese, M.D.     DATE OF BIRTH:  Feb 14, 1946  DATE OF PROCEDURE:  11/20/2014 DATE OF DISCHARGE:                              OPERATIVE REPORT   PREOPERATIVE DIAGNOSIS:  Right breast cancer.  POSTOPERATIVE DIAGNOSIS:  Right breast cancer.  PROCEDURE PERFORMED: 1. Right immediate breast reconstruction with tissue expander. 2. Distinct procedure is chest wall reconstruction with acellular     dermal matrix for inadequate muscle coverage and reset of     inframammary fold.  SURGEON:  Crissie Reese, M.D.  ANESTHESIA:  General.  ESTIMATED BLOOD LOSS:  20 mL.  DRAINS:  Two 19-French.  CLINICAL NOTE:  [name] who is having mastectomy and was interested in reconstruction.  Options were discussed and she selected the placement of a tissue expander as a planned staged procedure for eventual placement of an implant.  The nature of this procedure and risks were discussed with her.  She understood the possible use of acellular dermal matrix and the rationale for its use in the night as well as the nature of that material and agreed with it.  Other risks were discussed.  These included, but not limited to, bleeding, infection, healing problems, scarring, loss of sensation, fluid accumulations, anesthesia related complications, pneumothorax, DVT, PE, failure of the device, capsular contracture, displacement of the device, wrinkles, healing problems including possibility of loss of skin of the remaining chest skin post mastectomy, asymmetry, contour deformities, contour deformities of the periphery of the reconstruction, and overall disappointment.  She understood all of this and wished to proceed.  DESCRIPTION OF PROCEDURE:  The patient was in the operating room, and the mastectomy had been completed.  The skin  flaps were inspected and found to have appeared viable with good color and bright red bleeding at the periphery consistent with viability.  A thorough irrigation with saline and the dissection carried deep to the pectoralis major muscle. The muscle was inadequate inferiorly.  Serratus was raised for short distance lateral.  Thorough irrigation with saline as well as antibiotic solution.  Meticulous hemostasis with the electrocautery.  Great care taken throughout this dissection to avoid any damage to underlying chest cavity.  Tissue expander was prepared.  This was a Mentor 800 mL moderate-height tissue expander.  It was after thorough cleaning gloves, a 150 mL sterile saline placed using a closed filling system and it was returned to the antibiotic solution to soak.  Meanwhile, the acellular dermal matrix was also soaked in saline for almost 15 minutes and then was placed in antibiotic solution for approximately 15 minutes.  Having confirmed excellent hemostasis, the tissue expander was positioned and the muscles closed together superiorly at lateral border of pectoralis distal radius using 3-0 Vicryl simple interrupted sutures.  A small opening was left at the superolateral aspect for drainage.  Again after thoroughly cleaning gloves, the acellular dermal matrix was pie crusted and then applied with the dermal side facing up towards the overlying mastectomy flaps epidermal side down.  It was  secured at thalamus inframammary fold using 3-0 PDS simple running suture.  It was trimmed to fit, so that there was a nice fit without tension, but without any redundant acellular dermal matrix.  The fill was continued using a sterile saline via a closed filling system as before up to 600 mL of saline.  Two 19-French drains were positioned, brought through separate stab wounds inferiorly and secured with 3-0 Prolene sutures.  Care was taken to make sure that the subcutaneous tunnels were greater  than 5 cm in length for these drains.  The skin closure after again confirming excellent hemostasis, with 3-0 Monocryl interrupted inverted deep dermal sutures.  Skin edges have excellent color and appeared to be viable. Dermabond was applied.  Biopatch with SorbaView dressings applied to the drains and she was transported to the recovery room stable having tolerated the procedure well.     Crissie Reese, M.D.     DB/MEDQ  D:  11/20/2014  T:  11/21/2014  Job:  [id number]

## 2014-11-21 NOTE — Progress Notes (Signed)
Subjective: Sore but good pain control. No nausea. Tolerating diet well.  Objective: Vital signs in last 24 hours: Temp:  [97.5 F (36.4 C)-97.8 F (36.6 C)] 97.5 F (36.4 C) (03/02 0457) Pulse Rate:  [50-76] 76 (03/02 0457) Resp:  [7-21] 16 (03/02 0457) BP: (101-136)/(53-79) 118/56 mmHg (03/02 0457) SpO2:  [92 %-100 %] 94 % (03/02 0457) Weight:  [217 lb (98.431 kg)] 217 lb (98.431 kg) (03/01 0936)  Intake/Output from previous day: 03/01 0701 - 03/02 0700 In: 4891.7 [P.O.:840; I.V.:3951.7; IV Piggyback:100] Out: 2522 [Urine:2110; Drains:212; Blood:200] Intake/Output this shift: Total I/O In: 2135 [P.O.:720; I.V.:1315; IV Piggyback:100] Out: 8937 [Urine:1420; Drains:155]  Operative sites: Mastectomy flaps viable. Tissue expander in good position. Drains functioning. Drainage thin. No evidence of bleeding or infection. Chest is soft.  No results for input(s): WBC, HGB, HCT, NA, K, CL, CO2, BUN, CREATININE, GLU in the last 72 hours.  Invalid input(s): PLATELETS  Studies/Results: Nm Sentinel Node Inj-no Rpt (breast)  11/20/2014   CLINICAL DATA: RIGHT BREAST DCIS   Sulfur colloid was injected intradermally by the nuclear medicine  technologist for breast cancer sentinel node localization.     Assessment/Plan: Ambulate. Continue DVT and antibiotic prophylaxis.   LOS: 1 day    Sara [name] M 11/21/2014 6:32 AM

## 2014-11-21 NOTE — Progress Notes (Signed)
1 Day Post-Op  Subjective: Pt doing well  Objective: Vital signs in last 24 hours: Temp:  [97.5 F (36.4 C)-97.8 F (36.6 C)] 97.5 F (36.4 C) (03/02 0457) Pulse Rate:  [50-76] 76 (03/02 0457) Resp:  [7-21] 16 (03/02 0457) BP: (101-136)/(53-79) 118/56 mmHg (03/02 0457) SpO2:  [92 %-100 %] 94 % (03/02 0457) Weight:  [217 lb (98.431 kg)] 217 lb (98.431 kg) (03/01 0936) Last BM Date: 11/20/14  Intake/Output from previous day: 03/01 0701 - 03/02 0700 In: 4891.7 [P.O.:840; I.V.:3951.7; IV Piggyback:100] Out: 2522 [Urine:2110; Drains:212; Blood:200] Intake/Output this shift:    Flaps viable no hematoma  Lab Results:  No results for input(s): WBC, HGB, HCT, PLT in the last 72 hours. BMET No results for input(s): NA, K, CL, CO2, GLUCOSE, BUN, CREATININE, CALCIUM in the last 72 hours. PT/INR No results for input(s): LABPROT, INR in the last 72 hours. ABG No results for input(s): PHART, HCO3 in the last 72 hours.  Invalid input(s): PCO2, PO2  Studies/Results: Nm Sentinel Node Inj-no Rpt (breast)  11/20/2014   CLINICAL DATA: RIGHT BREAST DCIS   Sulfur colloid was injected intradermally by the nuclear medicine  technologist for breast cancer sentinel node localization.     Anti-infectives: Anti-infectives    Start     Dose/Rate Route Frequency Ordered Stop   11/20/14 2030  ceFAZolin (ANCEF) IVPB 1 g/50 mL premix     1 g 100 mL/hr over 30 Minutes Intravenous 4 times per day 11/20/14 1718     11/20/14 1045  bacitracin 50,000 Units, gentamicin (GARAMYCIN) 80 mg, ceFAZolin (ANCEF) 1 g in sodium chloride 0.9 % 1,000 mL      Irrigation Once 11/20/14 1033 11/20/14 1300   11/20/14 0600  ceFAZolin (ANCEF) IVPB 2 g/50 mL premix  Status:  Discontinued     2 g 100 mL/hr over 30 Minutes Intravenous On call to O.R. 11/19/14 1414 11/19/14 1414   11/20/14 0600  ceFAZolin (ANCEF) IVPB 2 g/50 mL premix     2 g 100 mL/hr over 30 Minutes Intravenous On call to O.R. 11/19/14 1414 11/20/14 1500       Assessment/Plan: s/p Procedure(s): SIMPLE MASTECTOMY WITH AXILLARY SENTINEL NODE BIOPSY (Right) RIGHT BREAST RECONSTRUCTION WITH PLACEMENT OF TISSUE EXPANDER AND ACELLULAR DERMA MATRIX (ACELLULAR DERMA MATRIX) (Right) Doing well [place] per Dr Harlow Mares.   LOS: 1 day    Sara [name] A. 11/21/2014

## 2014-11-22 LAB — CBC
HCT: 35.9 % — ABNORMAL LOW (ref 36.0–46.0)
Hemoglobin: 11.7 g/dL — ABNORMAL LOW (ref 12.0–15.0)
MCH: 29.8 pg (ref 26.0–34.0)
MCHC: 32.6 g/dL (ref 30.0–36.0)
MCV: 91.6 fL (ref 78.0–100.0)
Platelets: 211 10*3/uL (ref 150–400)
RBC: 3.92 MIL/uL (ref 3.87–5.11)
RDW: 14.9 % (ref 11.5–15.5)
WBC: 11.7 10*3/uL — ABNORMAL HIGH (ref 4.0–10.5)

## 2014-11-22 LAB — BASIC METABOLIC PANEL
Anion gap: 9 (ref 5–15)
BUN: <5 mg/dL — ABNORMAL LOW (ref 6–23)
CO2: 25 mmol/L (ref 19–32)
Calcium: 8.3 mg/dL — ABNORMAL LOW (ref 8.4–10.5)
Chloride: 100 mmol/L (ref 96–112)
Creatinine, Ser: 0.88 mg/dL (ref 0.50–1.10)
GFR calc Af Amer: 77 mL/min — ABNORMAL LOW (ref >90)
GFR calc non Af Amer: 66 mL/min — ABNORMAL LOW (ref >90)
Glucose, Bld: 100 mg/dL — ABNORMAL HIGH (ref 70–99)
Potassium: 3.9 mmol/L (ref 3.5–5.1)
Sodium: 134 mmol/L — ABNORMAL LOW (ref 135–145)

## 2014-11-22 MED ORDER — METHOCARBAMOL 500 MG PO TABS: 500.0000 mg | ORAL_TABLET | Freq: Four times a day (QID) | ORAL | Status: DC

## 2014-11-22 MED ORDER — DOCUSATE SODIUM 100 MG PO CAPS: 100.0000 mg | ORAL_CAPSULE | Freq: Two times a day (BID) | ORAL | Status: DC

## 2014-11-22 MED ORDER — OXYCODONE-ACETAMINOPHEN 5-325 MG PO TABS: 1.0000 | ORAL_TABLET | ORAL | Status: DC | PRN

## 2014-11-22 MED ORDER — ENOXAPARIN SODIUM 40 MG/0.4ML ~~LOC~~ SOLN: 40.0000 mg | SUBCUTANEOUS | Status: DC

## 2014-11-22 MED ORDER — CEPHALEXIN 500 MG PO CAPS
500.0000 mg | ORAL_CAPSULE | Freq: Three times a day (TID) | ORAL | Status: DC
  Administered 2014-11-22: 500 mg via ORAL
  Filled 2014-11-22: qty 1

## 2014-11-22 MED ORDER — CEPHALEXIN 500 MG PO CAPS: 500.0000 mg | ORAL_CAPSULE | Freq: Three times a day (TID) | ORAL | Status: DC

## 2014-11-22 NOTE — Progress Notes (Signed)
2 Days Post-Op  Subjective: Pt sore but ok  Objective: Vital signs in last 24 hours: Temp:  [98.4 F (36.9 C)-98.9 F (37.2 C)] 98.9 F (37.2 C) (03/03 0640) Pulse Rate:  [72-80] 72 (03/03 0640) Resp:  [16] 16 (03/03 0640) BP: (94-116)/(53-65) 94/53 mmHg (03/03 0640) SpO2:  [95 %-98 %] 95 % (03/03 0640) Last BM Date: 11/20/14  Intake/Output from previous day: 03/02 0701 - 03/03 0700 In: 2220 [P.O.:1320; I.V.:900] Out: 3400 [Urine:3125; Drains:275] Intake/Output this shift:    Incision/Wound:flaps viable.  No hematoma   No signs of infection  Lab Results:   Recent Labs  11/21/14 0832 11/22/14 0356  WBC 14.2* 11.7*  HGB 12.3 11.7*  HCT 37.4 35.9*  PLT 216 211   BMET  Recent Labs  11/21/14 0832 11/22/14 0356  NA 138 134*  K 4.1 3.9  CL 101 100  CO2 27 25  GLUCOSE 87 100*  BUN 6 <5*  CREATININE 0.84 0.88  CALCIUM 8.7 8.3*   PT/INR No results for input(s): LABPROT, INR in the last 72 hours. ABG No results for input(s): PHART, HCO3 in the last 72 hours.  Invalid input(s): PCO2, PO2  Studies/Results: Nm Sentinel Node Inj-no Rpt (breast)  11/20/2014   CLINICAL DATA: RIGHT BREAST DCIS   Sulfur colloid was injected intradermally by the nuclear medicine  technologist for breast cancer sentinel node localization.     Anti-infectives: Anti-infectives    Start     Dose/Rate Route Frequency Ordered Stop   11/22/14 0630  cephALEXin (KEFLEX) capsule 500 mg     500 mg Oral 3 times per day 11/22/14 0051     11/20/14 2030  ceFAZolin (ANCEF) IVPB 1 g/50 mL premix     1 g 100 mL/hr over 30 Minutes Intravenous 4 times per day 11/20/14 1718     11/20/14 1045  bacitracin 50,000 Units, gentamicin (GARAMYCIN) 80 mg, ceFAZolin (ANCEF) 1 g in sodium chloride 0.9 % 1,000 mL      Irrigation Once 11/20/14 1033 11/20/14 1300   11/20/14 0600  ceFAZolin (ANCEF) IVPB 2 g/50 mL premix  Status:  Discontinued     2 g 100 mL/hr over 30 Minutes Intravenous On call to O.R. 11/19/14  1414 11/19/14 1414   11/20/14 0600  ceFAZolin (ANCEF) IVPB 2 g/50 mL premix     2 g 100 mL/hr over 30 Minutes Intravenous On call to O.R. 11/19/14 1414 11/20/14 1500      Assessment/Plan: s/p Procedure(s): SIMPLE MASTECTOMY WITH AXILLARY SENTINEL NODE BIOPSY (Right) RIGHT BREAST RECONSTRUCTION WITH PLACEMENT OF TISSUE EXPANDER AND ACELLULAR DERMA MATRIX (ACELLULAR [place]) (Right) Looks well.  D/C per Dr Harlow Mares.  Follow up in 2 -3 weeks with me. PATH pending.   LOS: 2 days    Hallelujah Wysong A. 11/22/2014

## 2014-11-22 NOTE — Discharge Instructions (Addendum)
[facility] [place] °[phone] ° °MASTECTOMY: POST OP INSTRUCTIONS ° °Always review your discharge instruction sheet given to you by the facility where your surgery was performed. °IF YOU HAVE DISABILITY OR FAMILY LEAVE FORMS, YOU MUST BRING THEM TO THE OFFICE FOR PROCESSING.   °DO NOT GIVE THEM TO YOUR DOCTOR. °A prescription for pain medication may be given to you upon discharge.  Take your pain medication as prescribed, if needed.  If narcotic pain medicine is not needed, then you may take acetaminophen (Tylenol) or ibuprofen (Advil) as needed. °1. Take your usually prescribed medications unless otherwise directed. °2. If you need a refill on your pain medication, please contact your pharmacy.  They will contact our office to request authorization.  Prescriptions will not be filled after 5pm or on week-ends. °3. You should follow a light diet the first few days after arrival home, such as soup and crackers, etc.  Resume your normal diet the day after surgery. °4. Most patients will experience some swelling and bruising on the chest and underarm.  Ice packs will help.  Swelling and bruising can take several days to resolve.  °5. It is common to experience some constipation if taking pain medication after surgery.  Increasing fluid intake and taking a stool softener (such as Colace) will usually help or prevent this problem from occurring.  A mild laxative (Milk of Magnesia or Miralax) should be taken according to package instructions if there are no bowel movements after 48 hours. °6. Unless discharge instructions indicate otherwise, leave your bandage dry and in place until your next appointment in 3-5 days.  You may take a limited sponge bath.  No tube baths or showers until the drains are removed.  You may have steri-strips (small skin tapes) in place directly over the incision.  These strips should be left on the skin for 7-10 days.  If your surgeon used skin glue on the incision, you may  shower in 24 hours.  The glue will flake off over the next 2-3 weeks.  Any sutures or staples will be removed at the office during your follow-up visit. °7. DRAINS:  If you have drains in place, it is important to keep a list of the amount of drainage produced each day in your drains.  Before leaving the hospital, you should be instructed on drain care.  Call our office if you have any questions about your drains. °8. ACTIVITIES:  You may resume regular (light) daily activities beginning the next day--such as daily self-care, walking, climbing stairs--gradually increasing activities as tolerated.  You may have sexual intercourse when it is comfortable.  Refrain from any heavy lifting or straining until approved by your doctor. °a. You may drive when you are no longer taking prescription pain medication, you can comfortably wear a seatbelt, and you can safely maneuver your car and apply brakes. °b. RETURN TO WORK:  __________________________________________________________ °9. You should see your doctor in the office for a follow-up appointment approximately 3-5 days after your surgery.  Your doctor’s nurse will typically make your follow-up appointment when she calls you with your pathology report.  Expect your pathology report 2-3 business days after your surgery.  You may call to check if you do not hear from us after three days.   °10. OTHER INSTRUCTIONS: ______________________________________________________________________________________________ ____________________________________________________________________________________________ °WHEN TO CALL YOUR DOCTOR: °1. Fever over 101.0 °2. Nausea and/or vomiting °3. Extreme swelling or bruising °4. Continued bleeding from incision. °5. Increased pain, redness, or drainage from the incision. °  The clinic staff is available to answer your questions during regular business hours.  Please dont hesitate to call and ask to speak to one of the nurses for clinical  concerns.  If you have a medical emergency, go to the nearest emergency room or call 911.  A surgeon from [facility] is always on call at the hospital. [address] [place], [place], [place]  [address] ? [address], [place], [place]   [address] [phone] ? [phone] ? FAX [phone] Web site: [email or web address]  Instructions from Dr. Harlow Mares:  No lifting for 6 weeks No vigorous activity for 6 weeks (including outdoor walks) No driving for 4 weeks OK to walk up stairs slowly Stay propped up Use incentive spirometer at home every hour while awake No shower while drains are in place Empty drains at least three times a day and record the amounts separately Change drain dressings every third day if instructed to do so by Dr. Harlow Mares  Apply Bacitracin antibiotic ointment to the drain sites  Place gauze dressing over drains  Secure the gauze with tape Take an over-the-counter Probiotic while on antibiotics Take an over-the-counter stool softener (such as Colace) while on pain medication See Dr. Harlow Mares in office next week For questions call [phone] or [phone]

## 2014-11-22 NOTE — Discharge Summary (Signed)
Physician Discharge Summary  Patient ID: Sara [name] MRN: [id number] DOB/AGE: 04/18/1946 69 y.o.  Admit date: 11/20/2014 Discharge date: 11/22/2014  Admission Diagnoses: right breast cancer  Discharge Diagnoses:  same Active Problems:   Breast cancer   Breast cancer, right breast   Discharged Condition: good  Hospital Course: On the day of admission the patient was taken to surgery and had right mastectomy, sentinel node, and tissue expander with ADM. The patient tolerated the procedures well. Postoperatively, the flap maintained excellent color and capillary refill. The patient was ambulatory and tolerating diet on the first postoperative day. DVT and antibiotic prohylaxis were continued. At this point she is ambulating well and ready for discharge.  Treatments: antibiotics: Ancef, anticoagulation: LMW heparin and surgery: right mastectomy, sentinel node, tissue expander with ADM  Discharge Exam: Blood pressure 94/53, pulse 72, temperature 98.9 F (37.2 C), temperature source Oral, resp. rate 16, height 5\' 6"  (1.676 m), weight 217 lb (98.431 kg), SpO2 95 %.  Operative sites: Mastectomy flaps viable. Tissue expander appears well positioned. Drains functioning. Drainage thin. There is no evidence of bleeding, infection, or skin flap compromise.  Disposition: 06-Home-Health Care Svc     Medication List    STOP taking these medications        MULTIVITAMIN PO     oxyCODONE-acetaminophen 7.5-325 MG per tablet  Commonly known as:  PERCOCET  Replaced by:  oxyCODONE-acetaminophen 5-325 MG per tablet     PRESCRIPTION MEDICATION      TAKE these medications        cephALEXin 500 MG capsule  Commonly known as:  KEFLEX  Take 1 capsule (500 mg total) by mouth every 8 (eight) hours.     diphenhydrAMINE 25 MG tablet  Commonly known as:  SOMINEX  Take 50 mg by mouth at bedtime as needed for allergies or sleep.     docusate sodium 100 MG capsule  Commonly known as:  COLACE   Take 1 capsule (100 mg total) by mouth 2 (two) times daily.     enoxaparin 40 MG/0.4ML injection  Commonly known as:  LOVENOX  Inject 0.4 mLs (40 mg total) into the skin daily.     fluticasone 50 MCG/ACT nasal spray  Commonly known as:  FLONASE  Place 2 sprays into the nose at bedtime.     gabapentin 600 MG tablet  Commonly known as:  NEURONTIN  1/2 tab during the day and one tab at bedtime     levothyroxine 88 MCG tablet  Commonly known as:  SYNTHROID, LEVOTHROID  Take 88 mcg by mouth daily before breakfast.     LORazepam 1 MG tablet  Commonly known as:  ATIVAN  Take 1 mg by mouth at bedtime as needed for sleep. For sleep     losartan-hydrochlorothiazide 100-12.5 MG per tablet  Commonly known as:  HYZAAR  Take 1 tablet by mouth daily.     methocarbamol 500 MG tablet  Commonly known as:  ROBAXIN  Take 1 tablet (500 mg total) by mouth 4 (four) times daily.     metoprolol tartrate 25 MG tablet  Commonly known as:  LOPRESSOR  Take 25 mg by mouth 2 (two) times daily.     omeprazole 40 MG capsule  Commonly known as:  PRILOSEC  Take 40 mg by mouth daily before breakfast.     oxyCODONE-acetaminophen 5-325 MG per tablet  Commonly known as:  PERCOCET/ROXICET  Take 1-2 tablets by mouth every 4 (four) hours as needed for moderate pain.  polyethylene glycol packet  Commonly known as:  MIRALAX / GLYCOLAX  Take 17 g by mouth daily.     pravastatin 40 MG tablet  Commonly known as:  PRAVACHOL  Take 40 mg by mouth daily with breakfast.         Signed: Polina Burmaster M 11/22/2014, 9:14 AM

## 2014-11-22 NOTE — Progress Notes (Signed)
0330.  Pt. complained of painful IV site.  I have asked IV team to look for another IV site.   I was informed by IV team that they tried to obtain an IV site with ultrasound and was unable to.  I have called Dr. Harlow Mares.  He has given me new orders and he reports ok to leave IV out at this time.    I have informed Mrs. Borner of above information.

## 2014-11-27 ENCOUNTER — Telehealth (HOSPITAL_COMMUNITY): Payer: Self-pay

## 2014-12-07 ENCOUNTER — Other Ambulatory Visit (HOSPITAL_COMMUNITY): Payer: Self-pay

## 2014-12-10 ENCOUNTER — Other Ambulatory Visit (HOSPITAL_COMMUNITY): Payer: 59

## 2014-12-10 ENCOUNTER — Encounter (HOSPITAL_COMMUNITY): Payer: Medicare Other | Attending: Hematology & Oncology | Admitting: Hematology & Oncology

## 2014-12-10 ENCOUNTER — Encounter (HOSPITAL_COMMUNITY): Payer: Self-pay | Admitting: Hematology & Oncology

## 2014-12-10 VITALS — BP 128/82 | HR 99 | Temp 100.0°F | Resp 16 | Wt 216.4 lb

## 2014-12-10 DIAGNOSIS — R5082 Postprocedural fever: Secondary | ICD-10-CM | POA: Diagnosis not present

## 2014-12-10 DIAGNOSIS — D0501 Lobular carcinoma in situ of right breast: Secondary | ICD-10-CM | POA: Diagnosis not present

## 2014-12-10 DIAGNOSIS — D0511 Intraductal carcinoma in situ of right breast: Secondary | ICD-10-CM

## 2014-12-10 MED ORDER — TAMOXIFEN CITRATE 20 MG PO TABS: 20.0000 mg | ORAL_TABLET | Freq: Every day | ORAL | Status: DC

## 2014-12-10 NOTE — Progress Notes (Signed)
[place] CONSULT NOTE  Patient Care Team: [name], MD as PCP - General (Family Medicine) Daneil Dolin, MD (Gastroenterology) Josue Hector, MD as Attending Physician (Cardiology)  CHIEF COMPLAINTS/PURPOSE OF CONSULTATION:  DCIS R breast, DUCTAL CARCINOMA IN SITU, MIXED PATTERNS (CRIBRIFORM, COMEDO AND PAPILLARY), INVOLVING MULTIPLE CORES, AT LEAST 9 MM IN MAXIMAL EXTENT IN ANY ONE CORE.  Right Simple mastectomy with axillary sentinel node biopsy with reconstruction and placement of tissue expander 11/20/2014  TNM: pTis, pN0. Ultimate size of tumor, 10 cm. DCIS and LCIS. 7 sentinel nodes removed all negative for disease  HISTORY OF PRESENTING ILLNESS Sara [name] is a 69 y.o. female who underwent  screening mammography. Asymmetry was reported in the right breast.. The left breast was negative. Ultrasound of this area showed a irrgeular mass in the 12 o'clock location measuring 0.7 x 0.5 x 0.4 cm. Biopsy was performed and showed DCIS with focal necrosis. ER and PR were positive.   A breast MRI showed an 8 x 12 x 8 cm area of abnormal linear clumped enhancement throughout the majority of the central and upper right breast.   She has undergone a simple mastectomy of the right breast and sentinel node procedure. She is also receiving reconstructive therapy. She notices that last night she started feeling very poorly with a temperature up to 100.5. Her temperature here in the clinic today as 100. They also report the drainage from her JP drain has a strong odor to it. She sees her Psychiatric nurse later today. Her mood is much improved from when we first met. She states she feels comfortable with her decision she also has, long way in regards to excepting her disease. She is very interested in pursuing chemoprevention.  MEDICAL HISTORY:  Past Medical History  Diagnosis Date  . Pure hypercholesterolemia     takes Pravastin daily  . Allergic rhinitis due to pollen     . Cervicalgia   . Diverticulosis of colon (without mention of hemorrhage)   . Morbid obesity   . Palpitations   . Peptic ulcer, unspecified site, unspecified as acute or chronic, without mention of hemorrhage, perforation, or obstruction   . Shortness of breath   . H/O hiatal hernia   . HTN (hypertension)     takes Metoprolol and Lisinopril daily  . Hypertension   . GERD (gastroesophageal reflux disease)     takes Omeprazole daily  . Constipation     takes Miralax daily  . Insomnia     takes Ativan nigtly  . Other postablative hypothyroidism     takes SYnthroid daily  . Joint pain     "all of them"  . Joint swelling     "knees, legs, ankles sometimes" (11/20/2014)  . Sleep apnea     slight but doesn't require a CPAP (11/20/2014)  . Graves' disease     "took a pill to correct"  . Headache(784.0)     denies migraines since the 80's but has occ and takes Butalbital prn  . Cervical spondylosis without myelopathy     all over  . Degeneration of cervical intervertebral disc   . Primary localized osteoarthrosis, lower leg   . Arthritis     "knees, back, right elbow; left shoulder; right ankle" (11/20/2014)  . Chronic lower back pain   . Anxiety   . Depression   . Breast cancer, right breast     "DCIS; zero stage" S/P mastectomy 11/20/2014    SURGICAL HISTORY:  Past Surgical History  Procedure Laterality Date  . Knee arthroscopy Right   . Appendectomy    . Colonoscopy  Sept 2008    RMR: normal rectum, left-sided diverticula, repeat in 2018  . Abdominal hysterectomy    . Upper gastrointestinal endoscopy    . Cataract extraction w/ intraocular lens  implant, bilateral Bilateral   . Lumpectomy on pelvis      "mass of nerves"  . Diagnostic laparoscopy    . Tubal ligation    . Esophagogastroduodenoscopy    . Anterior lat lumbar fusion  05/13/2012    Procedure: ANTERIOR LATERAL LUMBAR FUSION 2 LEVELS;  Surgeon: Faythe Ghee, MD;  Location: [facility];  Service: Neurosurgery;   Laterality: Left;  Left Lumbar Three-four,Lumbar four-five Extreme Lumbar Interbody Fusion with Percutaneous Pedicle Screws  . Knee arthroscopy with lateral menisectomy Right 10/06/2013    Procedure: KNEE ARTHROSCOPY WITH LATERAL AND MEDIAL MENISECTOMY;  Surgeon: Carole Civil, MD;  Location: [facility];  Service: Orthopedics;  Laterality: Right;  END @ 1234  . Foreign body removal Right 10/06/2013    Procedure: REMOVAL FOREIGN BODY EXTREMITY;  Surgeon: Carole Civil, MD;  Location: [facility];  Service: Orthopedics;  Laterality: Right;  . Injection knee Left 10/06/2013    Procedure: KNEE INJECTION;  Surgeon: Carole Civil, MD;  Location: [facility];  Service: Orthopedics;  Laterality: Left;  . Total knee arthroplasty Right 01/24/2014    Procedure: TOTAL KNEE ARTHROPLASTY;  Surgeon: Carole Civil, MD;  Location: [facility];  Service: Orthopedics;  Laterality: Right;  . Total knee arthroplasty Left 08/08/2014    Procedure: TOTAL KNEE ARTHROPLASTY;  Surgeon: Carole Civil, MD;  Location: [facility];  Service: Orthopedics;  Laterality: Left;  [place] Kitchen Mastectomy complete / simple w/ sentinel node biopsy Right 11/20/2014    axillary  . Breast reconstruction with placement of tissue expander and flex hd (acellular hydrated dermis) Right 11/20/2014  . Joint replacement    . Tonsillectomy    . Dilation and curettage of uterus    . Breast lumpectomy Left 1988  . Breast biopsy Left ~ 2014  . Breast biopsy Right 2015  . Simple mastectomy with axillary sentinel node biopsy Right 11/20/2014    Procedure: SIMPLE MASTECTOMY WITH AXILLARY SENTINEL NODE BIOPSY;  Surgeon: Erroll Luna, MD;  Location: [facility];  Service: General;  Laterality: Right;  . Breast reconstruction with placement of tissue expander and flex hd (acellular hydrated dermis) Right 11/20/2014    Procedure: RIGHT BREAST RECONSTRUCTION WITH PLACEMENT OF TISSUE EXPANDER AND ACELLULAR DERMA MATRIX ([place]);  Surgeon: Crissie Reese, MD;   Location: [facility];  Service: Plastics;  Laterality: Right;    SOCIAL HISTORY: History   Social History  . Marital Status: Married    Spouse Name: N/A  . Number of Children: N/A  . Years of Education: N/A   Occupational History  . disability     since 2008, knee surgery   Social History Main Topics  . Smoking status: Former Smoker -- 1.00 packs/day for 27 years    Types: Cigarettes    Quit date: 12/26/1985  . Smokeless tobacco: Never Used  . Alcohol Use: 2.4 oz/week    4 Glasses of wine, 0 Standard drinks or equivalent per week     Comment: occasional wine  . Drug Use: No  . Sexual Activity: Yes    Birth Control/ Protection: Surgical   Other Topics Concern  . Not on file   Social History  Narrative   Currently married for 10 years. Husband's name is Milbert Coulter. She is an excellent singer. 8 children between she and her husband. 23 grand children. 12 great grandchildren.    FAMILY HISTORY: Family History  Problem Relation Age of Onset  . Colon cancer Neg Hx     Mother died at 20 from blood clot, MI  . Heart attack      Father died in 30's from old age  . Heart attack      2 Brothers, 3 sisters  . Stroke Sister   . Osteoarthritis Sister   . Osteoarthritis Brother   . Obesity Brother   . Heart attack Sister    has no family status information on file.   ALLERGIES:  is allergic to propoxyphene hcl; camphor; dust mite extract; latex; molds & smuts; and tomato.  MEDICATIONS:  Current Outpatient Prescriptions  Medication Sig Dispense Refill  . acetaminophen (TYLENOL) 500 MG tablet Take 500 mg by mouth every 6 (six) hours as needed.    . diphenhydrAMINE (SOMINEX) 25 MG tablet Take 50 mg by mouth at bedtime as needed for allergies or sleep.    . fluticasone (FLONASE) 50 MCG/ACT nasal spray Place 2 sprays into the nose at bedtime.     . gabapentin (NEURONTIN) 600 MG tablet 1/2 tab during the day and one tab at bedtime 60 tablet 5  . levothyroxine (SYNTHROID, LEVOTHROID) 88  MCG tablet Take 88 mcg by mouth daily before breakfast.     . LORazepam (ATIVAN) 1 MG tablet Take 1 mg by mouth at bedtime as needed for sleep. For sleep    . losartan-hydrochlorothiazide (HYZAAR) 100-12.5 MG per tablet Take 1 tablet by mouth daily.     . methocarbamol (ROBAXIN) 500 MG tablet Take 1 tablet (500 mg total) by mouth 4 (four) times daily. 40 tablet 1  . metoprolol tartrate (LOPRESSOR) 25 MG tablet Take 25 mg by mouth 2 (two) times daily.     . Multiple Vitamin (MULTIVITAMIN) tablet Take 1 tablet by mouth. Takes occasionally    . omeprazole (PRILOSEC) 40 MG capsule Take 40 mg by mouth daily before breakfast.     . pravastatin (PRAVACHOL) 40 MG tablet Take 40 mg by mouth daily with breakfast.     . cephALEXin (KEFLEX) 500 MG capsule Take 1 capsule (500 mg total) by mouth every 8 (eight) hours. (Patient not taking: Reported on 12/10/2014) 40 capsule 0  . docusate sodium (COLACE) 100 MG capsule Take 1 capsule (100 mg total) by mouth 2 (two) times daily. (Patient not taking: Reported on 12/10/2014) 30 capsule 0  . enoxaparin (LOVENOX) 40 MG/0.4ML injection Inject 0.4 mLs (40 mg total) into the skin daily. (Patient not taking: Reported on 12/10/2014) 12 Syringe 0  . oxyCODONE-acetaminophen (PERCOCET/ROXICET) 5-325 MG per tablet Take 1-2 tablets by mouth every 4 (four) hours as needed for moderate pain. (Patient not taking: Reported on 12/10/2014) 40 tablet 0  . polyethylene glycol (MIRALAX / GLYCOLAX) packet Take 17 g by mouth daily. (Patient not taking: Reported on 12/10/2014) 14 each 0  . tamoxifen (NOLVADEX) 20 MG tablet Take 1 tablet (20 mg total) by mouth daily. 30 tablet 3  . [DISCONTINUED] Ranitidine HCl (ZANTAC 75 PO) Take by mouth.       No current facility-administered medications for this visit.    Review of Systems  Constitutional: Positive for fever and chills. Negative for weight loss and malaise/fatigue.  HENT: Negative for congestion, hearing loss, nosebleeds, sore throat and  tinnitus.  Eyes: Negative for blurred vision, double vision, pain and discharge.  Respiratory: Negative for cough, hemoptysis, sputum production, shortness of breath and wheezing.   Cardiovascular: Negative for chest pain, palpitations, claudication, leg swelling and PND.  Gastrointestinal: Negative for heartburn, nausea, vomiting, abdominal pain, diarrhea, constipation, blood in stool and melena.  Genitourinary: Negative for dysuria, urgency, frequency and hematuria.  Musculoskeletal: Negative for myalgias, joint pain and falls.  Skin: Negative for itching and rash.  Neurological: Negative for dizziness, tingling, tremors, sensory change, speech change, focal weakness, seizures, loss of consciousness, weakness and headaches.  Endo/Heme/Allergies: Does not bruise/bleed easily.  Psychiatric/Behavioral: Negative for depression, suicidal ideas, memory loss and substance abuse. The patient is not nervous/anxious and does not have insomnia.   See HPI  PHYSICAL EXAMINATION: ECOG PERFORMANCE STATUS: 0 - Asymptomatic  Filed Vitals:   12/10/14 1200  BP: 128/82  Pulse: 99  Temp: 100 F (37.8 C)  Resp: 16   Filed Weights   12/10/14 1200  Weight: 216 lb 6.4 oz (98.158 kg)     Physical Exam  Constitutional: She is oriented to person, place, and time and well-developed, well-nourished, and in no distress.  HENT:  Head: Normocephalic and atraumatic.  Nose: Nose normal.  Mouth/Throat: Oropharynx is clear and moist. No oropharyngeal exudate.  Eyes: Conjunctivae and EOM are normal. Pupils are equal, round, and reactive to light. Right eye exhibits no discharge. Left eye exhibits no discharge. No scleral icterus.  Neck: Normal range of motion. Neck supple. No tracheal deviation present. No thyromegaly present.  Cardiovascular: Normal rate, regular rhythm and normal heart sounds.  Exam reveals no gallop and no friction rub.   No murmur heard. Pulmonary/Chest: Effort normal and breath sounds  normal. She has no wheezes. She has no rales.  Binder in place. Upon removal right mastectomy/reconstruction site shows associated erythema with mild drainage. JP drain has cloudy fluid present. Skin is warm to touch.  Abdominal: Soft. Bowel sounds are normal. She exhibits no distension and no mass. There is no tenderness. There is no rebound and no guarding.  Musculoskeletal: Normal range of motion. She exhibits no edema.  Lymphadenopathy:    She has no cervical adenopathy.  Neurological: She is alert and oriented to person, place, and time. She has normal reflexes. No cranial nerve deficit. Gait normal. Coordination normal.  Skin: Skin is warm and dry. No rash noted.  Psychiatric: Mood, memory, affect and judgment normal.  Nursing note and vitals reviewed.    LABORATORY DATA:  I have reviewed the data as listed Lab Results  Component Value Date   WBC 11.7* 11/22/2014   HGB 11.7* 11/22/2014   HCT 35.9* 11/22/2014   MCV 91.6 11/22/2014   PLT 211 11/22/2014     Chemistry      Component Value Date/Time   NA 134* 11/22/2014 0356   NA 142 08/19/2011 0826   K 3.9 11/22/2014 0356   K 4.2 08/19/2011 0826   CL 100 11/22/2014 0356   CL 101 08/19/2011 0826   CO2 25 11/22/2014 0356   BUN <5* 11/22/2014 0356   CREATININE 0.88 11/22/2014 0356      Component Value Date/Time   CALCIUM 8.3* 11/22/2014 0356   CALCIUM 9.4 08/19/2011 0826   ALKPHOS 73 11/12/2014 1530   ALKPHOS 98 08/19/2011 0826   AST 29 11/12/2014 1530   AST 30 08/19/2011 0826   ALT 19 11/12/2014 1530   BILITOT 0.5 11/12/2014 1530   BILITOT 0.5 05/13/2011 0831  ASSESSMENT & PLAN:   DCIS/LCIS of the right breast  69 year old female who has undergone simple mastectomy and sentinel node procedure for extensive DCIS/LCIS of the right breast. All sentinel nodes were negative for disease. She is currently pursuing reconstruction. There is some concern today of infection based on her fever and drainage from her  surgical site. She is following up with her plastic surgeon immediately on leaving the clinic today.  She is very interested in pursuing chemoprevention. She has had a hysterectomy. She read all the information that was provided to her at her last visit. She would like to try tamoxifen. I did give her a prescription today but advised her not to start it until all of her issues with her reconstruction are resolved over the next week or 2. I will tentatively schedule her to follow-up with [place] in 4 weeks at which time we will check laboratory studies and answer any questions. Side effects risks and benefits of tamoxifen were again addressed with the patient in detail.   All questions were answered. The patient knows to call the clinic with any problems, questions or concerns.    Molli Hazard, MD MD 12/10/2014 5:37 PM

## 2014-12-10 NOTE — Patient Instructions (Signed)
[place] at [facility] Discharge Instructions  RECOMMENDATIONS MADE BY THE CONSULTANT AND ANY TEST RESULTS WILL BE SENT TO YOUR REFERRING PHYSICIAN.  Exam and discussion by Dr. Whitney Muse. You do need to see your surgeon about the drainage that you are having. Will start you on Tamoxifen - take as directed Call with any issues or concerns.  Will see you back in 6 weeks with labs and office visit to see how you are doing  Thank you for choosing [place] at [facility] to provide your oncology and hematology care.  To afford each patient quality time with our provider, please arrive at least 15 minutes before your scheduled appointment time.    You need to re-schedule your appointment should you arrive 10 or more minutes late.  We strive to give you quality time with our providers, and arriving late affects you and other patients whose appointments are after yours.  Also, if you no show three or more times for appointments you may be dismissed from the clinic at the providers discretion.     Again, thank you for choosing [facility].  Our hope is that these requests will decrease the amount of time that you wait before being seen by our physicians.       _____________________________________________________________  Should you have questions after your visit to [facility], please contact our office at [phone] between the hours of 8:30 a.m. and 4:30 p.m.  Voicemails left after 4:30 p.m. will not be returned until the following business day.  For prescription refill requests, have your pharmacy contact our office.

## 2014-12-12 ENCOUNTER — Encounter (HOSPITAL_COMMUNITY): Payer: Self-pay | Admitting: *Deleted

## 2014-12-12 ENCOUNTER — Other Ambulatory Visit (HOSPITAL_COMMUNITY): Payer: Self-pay | Admitting: Plastic Surgery

## 2014-12-12 MED ORDER — HEPARIN SODIUM (PORCINE) 5000 UNIT/ML IJ SOLN
5000.0000 [IU] | Freq: Once | INTRAMUSCULAR | Status: AC
  Administered 2014-12-13: 5000 [IU] via SUBCUTANEOUS
  Filled 2014-12-12: qty 1

## 2014-12-12 MED ORDER — VANCOMYCIN HCL IN DEXTROSE 1-5 GM/200ML-% IV SOLN
1000.0000 mg | INTRAVENOUS | Status: AC
  Administered 2014-12-13: 1000 mg via INTRAVENOUS
  Filled 2014-12-12: qty 200

## 2014-12-13 ENCOUNTER — Ambulatory Visit (HOSPITAL_COMMUNITY)
Admission: RE | Admit: 2014-12-13 | Discharge: 2014-12-14 | Disposition: A | Discharge status: Home / Self Care | Payer: Medicare Other | Source: Ambulatory Visit | Attending: Plastic Surgery | Admitting: Plastic Surgery

## 2014-12-13 ENCOUNTER — Encounter (HOSPITAL_COMMUNITY): Payer: Self-pay | Admitting: *Deleted

## 2014-12-13 ENCOUNTER — Encounter (HOSPITAL_COMMUNITY)
Admission: RE | Disposition: A | Discharge status: Home / Self Care | Payer: Self-pay | Source: Ambulatory Visit | Attending: Plastic Surgery

## 2014-12-13 ENCOUNTER — Ambulatory Visit (HOSPITAL_COMMUNITY): Payer: Medicare Other | Admitting: Anesthesiology

## 2014-12-13 DIAGNOSIS — Z9011 Acquired absence of right breast and nipple: Secondary | ICD-10-CM | POA: Diagnosis not present

## 2014-12-13 DIAGNOSIS — Z853 Personal history of malignant neoplasm of breast: Secondary | ICD-10-CM | POA: Diagnosis present

## 2014-12-13 DIAGNOSIS — C50919 Malignant neoplasm of unspecified site of unspecified female breast: Secondary | ICD-10-CM | POA: Diagnosis present

## 2014-12-13 DIAGNOSIS — D0511 Intraductal carcinoma in situ of right breast: Secondary | ICD-10-CM | POA: Diagnosis not present

## 2014-12-13 DIAGNOSIS — N641 Fat necrosis of breast: Secondary | ICD-10-CM | POA: Diagnosis not present

## 2014-12-13 DIAGNOSIS — C50911 Malignant neoplasm of unspecified site of right female breast: Secondary | ICD-10-CM | POA: Diagnosis not present

## 2014-12-13 DIAGNOSIS — L905 Scar conditions and fibrosis of skin: Secondary | ICD-10-CM | POA: Insufficient documentation

## 2014-12-13 HISTORY — PX: TISSUE EXPANDER REMOVAL: SHX2531

## 2014-12-13 HISTORY — PX: REMOVAL OF TISSUE EXPANDER AND PLACEMENT OF IMPLANT: SHX6457

## 2014-12-13 LAB — CBC
HCT: 35.3 % — ABNORMAL LOW (ref 36.0–46.0)
Hemoglobin: 11.8 g/dL — ABNORMAL LOW (ref 12.0–15.0)
MCH: 30.4 pg (ref 26.0–34.0)
MCHC: 33.4 g/dL (ref 30.0–36.0)
MCV: 91 fL (ref 78.0–100.0)
Platelets: 215 10*3/uL (ref 150–400)
RBC: 3.88 MIL/uL (ref 3.87–5.11)
RDW: 14.7 % (ref 11.5–15.5)
WBC: 7.5 10*3/uL (ref 4.0–10.5)

## 2014-12-13 LAB — BASIC METABOLIC PANEL
Anion gap: 13 (ref 5–15)
BUN: 5 mg/dL — ABNORMAL LOW (ref 6–23)
CO2: 23 mmol/L (ref 19–32)
Calcium: 9 mg/dL (ref 8.4–10.5)
Chloride: 104 mmol/L (ref 96–112)
Creatinine, Ser: 0.75 mg/dL (ref 0.50–1.10)
GFR calc Af Amer: >90 mL/min (ref >90)
GFR calc non Af Amer: 86 mL/min — ABNORMAL LOW (ref >90)
Glucose, Bld: 102 mg/dL — ABNORMAL HIGH (ref 70–99)
Potassium: 4.4 mmol/L (ref 3.5–5.1)
Sodium: 140 mmol/L (ref 135–145)

## 2014-12-13 SURGERY — REMOVAL, TISSUE EXPANDER, BREAST, WITH IMPLANT INSERTION
Anesthesia: General | Site: Breast | Laterality: Right

## 2014-12-13 MED ORDER — GABAPENTIN 600 MG PO TABS: 300.0000 mg | ORAL_TABLET | Freq: Two times a day (BID) | ORAL | Status: DC

## 2014-12-13 MED ORDER — PROMETHAZINE HCL 25 MG/ML IJ SOLN: 6.2500 mg | INTRAMUSCULAR | Status: DC | PRN

## 2014-12-13 MED ORDER — 0.9 % SODIUM CHLORIDE (POUR BTL) OPTIME
TOPICAL | Status: DC | PRN
  Administered 2014-12-13 (×3): 1000 mL

## 2014-12-13 MED ORDER — VANCOMYCIN HCL IN DEXTROSE 1-5 GM/200ML-% IV SOLN
1000.0000 mg | Freq: Two times a day (BID) | INTRAVENOUS | Status: AC
  Administered 2014-12-13 – 2014-12-14 (×2): 1000 mg via INTRAVENOUS
  Filled 2014-12-13 (×2): qty 200

## 2014-12-13 MED ORDER — HYDROMORPHONE HCL 1 MG/ML IJ SOLN: 0.2500 mg | INTRAMUSCULAR | Status: DC | PRN

## 2014-12-13 MED ORDER — LEVOTHYROXINE SODIUM 88 MCG PO TABS
88.0000 ug | ORAL_TABLET | Freq: Every day | ORAL | Status: DC
  Administered 2014-12-14: 88 ug via ORAL
  Filled 2014-12-13: qty 1

## 2014-12-13 MED ORDER — ONDANSETRON HCL 4 MG/2ML IJ SOLN
INTRAMUSCULAR | Status: AC
  Filled 2014-12-13: qty 2

## 2014-12-13 MED ORDER — METOPROLOL TARTRATE 25 MG PO TABS
25.0000 mg | ORAL_TABLET | Freq: Two times a day (BID) | ORAL | Status: DC
  Administered 2014-12-13 – 2014-12-14 (×2): 25 mg via ORAL
  Filled 2014-12-13 (×2): qty 1

## 2014-12-13 MED ORDER — MIDAZOLAM HCL 5 MG/5ML IJ SOLN
INTRAMUSCULAR | Status: DC | PRN
  Administered 2014-12-13: 2 mg via INTRAVENOUS

## 2014-12-13 MED ORDER — OXYCODONE HCL 5 MG/5ML PO SOLN: 5.0000 mg | Freq: Once | ORAL | Status: AC | PRN

## 2014-12-13 MED ORDER — NEOMYCIN-POLYMYXIN B GU 40-200000 IR SOLN
Status: DC
  Filled 2014-12-13: qty 1

## 2014-12-13 MED ORDER — PHENYLEPHRINE 40 MCG/ML (10ML) SYRINGE FOR IV PUSH (FOR BLOOD PRESSURE SUPPORT)
PREFILLED_SYRINGE | INTRAVENOUS | Status: AC
  Filled 2014-12-13: qty 10

## 2014-12-13 MED ORDER — ONDANSETRON HCL 4 MG/2ML IJ SOLN
INTRAMUSCULAR | Status: DC | PRN
  Administered 2014-12-13: 4 mg via INTRAVENOUS

## 2014-12-13 MED ORDER — FENTANYL CITRATE 0.05 MG/ML IJ SOLN
INTRAMUSCULAR | Status: AC
  Filled 2014-12-13: qty 5

## 2014-12-13 MED ORDER — GABAPENTIN 600 MG PO TABS
600.0000 mg | ORAL_TABLET | Freq: Every day | ORAL | Status: DC
  Administered 2014-12-13: 600 mg via ORAL
  Filled 2014-12-13: qty 1

## 2014-12-13 MED ORDER — TAMOXIFEN CITRATE 10 MG PO TABS
20.0000 mg | ORAL_TABLET | Freq: Every day | ORAL | Status: DC
  Filled 2014-12-13: qty 2

## 2014-12-13 MED ORDER — ENOXAPARIN SODIUM 40 MG/0.4ML ~~LOC~~ SOLN
40.0000 mg | SUBCUTANEOUS | Status: DC
  Administered 2014-12-14: 40 mg via SUBCUTANEOUS
  Filled 2014-12-13: qty 0.4

## 2014-12-13 MED ORDER — DEXAMETHASONE SODIUM PHOSPHATE 4 MG/ML IJ SOLN
INTRAMUSCULAR | Status: DC | PRN
  Administered 2014-12-13: 4 mg via INTRAVENOUS

## 2014-12-13 MED ORDER — DEXAMETHASONE SODIUM PHOSPHATE 4 MG/ML IJ SOLN
INTRAMUSCULAR | Status: AC
  Filled 2014-12-13: qty 1

## 2014-12-13 MED ORDER — OXYCODONE-ACETAMINOPHEN 5-325 MG PO TABS
1.0000 | ORAL_TABLET | ORAL | Status: DC | PRN
  Administered 2014-12-13 – 2014-12-14 (×3): 1 via ORAL
  Filled 2014-12-13: qty 2
  Filled 2014-12-13: qty 1
  Filled 2014-12-13: qty 2

## 2014-12-13 MED ORDER — LACTATED RINGERS IV SOLN
INTRAVENOUS | Status: DC | PRN
  Administered 2014-12-13 (×2): via INTRAVENOUS

## 2014-12-13 MED ORDER — HYDROMORPHONE HCL 2 MG PO TABS: 2.0000 mg | ORAL_TABLET | ORAL | Status: DC | PRN

## 2014-12-13 MED ORDER — MEPERIDINE HCL 25 MG/ML IJ SOLN: 6.2500 mg | INTRAMUSCULAR | Status: DC | PRN

## 2014-12-13 MED ORDER — SODIUM CHLORIDE 0.9 % IR SOLN
Status: DC
  Filled 2014-12-13: qty 3

## 2014-12-13 MED ORDER — DEXTROSE 5 % IV SOLN
INTRAVENOUS | Status: DC | PRN
  Administered 2014-12-13: 13:00:00 via INTRAVENOUS

## 2014-12-13 MED ORDER — LORAZEPAM 1 MG PO TABS: 1.0000 mg | ORAL_TABLET | Freq: Every evening | ORAL | Status: DC | PRN

## 2014-12-13 MED ORDER — LIDOCAINE HCL (CARDIAC) 20 MG/ML IV SOLN
INTRAVENOUS | Status: DC | PRN
  Administered 2014-12-13: 100 mg via INTRAVENOUS

## 2014-12-13 MED ORDER — PHENYLEPHRINE HCL 10 MG/ML IJ SOLN
INTRAMUSCULAR | Status: DC | PRN
  Administered 2014-12-13 (×7): 80 ug via INTRAVENOUS

## 2014-12-13 MED ORDER — SODIUM CHLORIDE 0.9 % IV SOLN
INTRAVENOUS | Status: DC
  Filled 2014-12-13: qty 1

## 2014-12-13 MED ORDER — PROPOFOL 10 MG/ML IV BOLUS
INTRAVENOUS | Status: DC | PRN
  Administered 2014-12-13: 175 mg via INTRAVENOUS

## 2014-12-13 MED ORDER — GABAPENTIN 300 MG PO CAPS
300.0000 mg | ORAL_CAPSULE | Freq: Every day | ORAL | Status: DC
  Administered 2014-12-14: 300 mg via ORAL
  Filled 2014-12-13: qty 1

## 2014-12-13 MED ORDER — FENTANYL CITRATE 0.05 MG/ML IJ SOLN
INTRAMUSCULAR | Status: DC | PRN
  Administered 2014-12-13 (×2): 50 ug via INTRAVENOUS

## 2014-12-13 MED ORDER — LOSARTAN POTASSIUM-HCTZ 100-12.5 MG PO TABS: 1.0000 | ORAL_TABLET | Freq: Every day | ORAL | Status: DC

## 2014-12-13 MED ORDER — MIDAZOLAM HCL 2 MG/2ML IJ SOLN
INTRAMUSCULAR | Status: AC
  Filled 2014-12-13: qty 2

## 2014-12-13 MED ORDER — OXYCODONE HCL 5 MG PO TABS: 5.0000 mg | ORAL_TABLET | Freq: Once | ORAL | Status: AC | PRN

## 2014-12-13 MED ORDER — ARTIFICIAL TEARS OP OINT
TOPICAL_OINTMENT | OPHTHALMIC | Status: DC | PRN
  Administered 2014-12-13: 1 via OPHTHALMIC

## 2014-12-13 MED ORDER — PANTOPRAZOLE SODIUM 40 MG PO TBEC
80.0000 mg | DELAYED_RELEASE_TABLET | Freq: Every day | ORAL | Status: DC
  Administered 2014-12-14: 80 mg via ORAL
  Filled 2014-12-13 (×2): qty 2

## 2014-12-13 MED ORDER — METHOCARBAMOL 500 MG PO TABS
500.0000 mg | ORAL_TABLET | Freq: Four times a day (QID) | ORAL | Status: DC
  Administered 2014-12-13 – 2014-12-14 (×2): 500 mg via ORAL
  Filled 2014-12-13 (×2): qty 1

## 2014-12-13 MED ORDER — DEXTROSE-NACL 5-0.45 % IV SOLN
INTRAVENOUS | Status: DC
  Administered 2014-12-13 – 2014-12-14 (×2): via INTRAVENOUS

## 2014-12-13 MED ORDER — HYDROCHLOROTHIAZIDE 12.5 MG PO CAPS
12.5000 mg | ORAL_CAPSULE | Freq: Every day | ORAL | Status: DC
  Administered 2014-12-14: 12.5 mg via ORAL
  Filled 2014-12-13: qty 1

## 2014-12-13 MED ORDER — SODIUM CHLORIDE 0.9 % IV SOLN
INTRAVENOUS | Status: AC
  Administered 2014-12-13: 14:00:00
  Filled 2014-12-13: qty 1

## 2014-12-13 MED ORDER — FLUTICASONE PROPIONATE 50 MCG/ACT NA SUSP
2.0000 | Freq: Every day | NASAL | Status: DC
  Administered 2014-12-13: 2 via NASAL
  Filled 2014-12-13: qty 16

## 2014-12-13 MED ORDER — LACTATED RINGERS IV SOLN
INTRAVENOUS | Status: DC
  Administered 2014-12-13: 13:00:00 via INTRAVENOUS

## 2014-12-13 MED ORDER — PROPOFOL 10 MG/ML IV BOLUS
INTRAVENOUS | Status: AC
  Filled 2014-12-13: qty 20

## 2014-12-13 MED ORDER — PRAVASTATIN SODIUM 10 MG PO TABS
40.0000 mg | ORAL_TABLET | Freq: Every day | ORAL | Status: DC
  Administered 2014-12-14: 40 mg via ORAL
  Filled 2014-12-13: qty 4

## 2014-12-13 MED ORDER — LOSARTAN POTASSIUM 50 MG PO TABS
100.0000 mg | ORAL_TABLET | Freq: Every day | ORAL | Status: DC
  Administered 2014-12-14: 100 mg via ORAL
  Filled 2014-12-13: qty 2

## 2014-12-13 SURGICAL SUPPLY — 62 items
ADH SKN CLS APL DERMABOND .7 (GAUZE/BANDAGES/DRESSINGS)
APPLIER CLIP 9.375 MED OPEN (MISCELLANEOUS)
APR CLP MED 9.3 20 MLT OPN (MISCELLANEOUS)
ATCH SMKEVC FLXB CAUT HNDSWH (FILTER) ×1 IMPLANT
BAG DECANTER FOR FLEXI CONT (MISCELLANEOUS) ×2 IMPLANT
BINDER BREAST XLRG (GAUZE/BANDAGES/DRESSINGS) ×1 IMPLANT
BIOPATCH RED 1 DISK 7.0 (GAUZE/BANDAGES/DRESSINGS) ×1 IMPLANT
BLADE 10 SAFETY STRL DISP (BLADE) ×1 IMPLANT
CANISTER SUCTION 2500CC (MISCELLANEOUS) ×3 IMPLANT
CHLORAPREP W/TINT 26ML (MISCELLANEOUS) ×1 IMPLANT
CLIP APPLIE 9.375 MED OPEN (MISCELLANEOUS) IMPLANT
COVER SURGICAL LIGHT HANDLE (MISCELLANEOUS) ×2 IMPLANT
DERMABOND ADVANCED (GAUZE/BANDAGES/DRESSINGS)
DERMABOND ADVANCED .7 DNX12 (GAUZE/BANDAGES/DRESSINGS) ×1 IMPLANT
DRAIN CHANNEL 19F RND (DRAIN) ×1 IMPLANT
DRAPE ORTHO SPLIT 77X108 STRL (DRAPES) ×4
DRAPE PROXIMA HALF (DRAPES) ×1 IMPLANT
DRAPE SURG 17X23 STRL (DRAPES) ×8 IMPLANT
DRAPE SURG ORHT 6 SPLT 77X108 (DRAPES) ×2 IMPLANT
DRAPE WARM FLUID 44X44 (DRAPE) ×2 IMPLANT
DRSG PAD ABDOMINAL 8X10 ST (GAUZE/BANDAGES/DRESSINGS) ×3 IMPLANT
DRSG SORBAVIEW 3.5X5-5/16 MED (GAUZE/BANDAGES/DRESSINGS) ×1 IMPLANT
DRSG TEGADERM 4X4.75 (GAUZE/BANDAGES/DRESSINGS) IMPLANT
ELECT BLADE 6.5 EXT (BLADE) ×1 IMPLANT
ELECT CAUTERY BLADE 6.4 (BLADE) ×2 IMPLANT
ELECT REM PT RETURN 9FT ADLT (ELECTROSURGICAL) ×2
ELECTRODE REM PT RTRN 9FT ADLT (ELECTROSURGICAL) ×1 IMPLANT
EVACUATOR SILICONE 100CC (DRAIN) ×1 IMPLANT
EVACUATOR SMOKE ACCUVAC VALLEY (FILTER) ×1
GAUZE SPONGE 2X2 8PLY STRL LF (GAUZE/BANDAGES/DRESSINGS) IMPLANT
GLOVE BIO SURGEON STRL SZ7.5 (GLOVE) ×2 IMPLANT
GLOVE BIOGEL PI IND STRL 8 (GLOVE) ×1 IMPLANT
GLOVE BIOGEL PI INDICATOR 8 (GLOVE) ×1
GLOVE SURG SS PI 6.5 STRL IVOR (GLOVE) ×1 IMPLANT
GLOVE SURG SS PI 7.0 STRL IVOR (GLOVE) ×1 IMPLANT
GLOVE SURG SS PI 7.5 STRL IVOR (GLOVE) ×3 IMPLANT
GOWN STRL REUS W/ TWL LRG LVL3 (GOWN DISPOSABLE) ×1 IMPLANT
GOWN STRL REUS W/ TWL XL LVL3 (GOWN DISPOSABLE) ×1 IMPLANT
GOWN STRL REUS W/TWL LRG LVL3 (GOWN DISPOSABLE) ×2
GOWN STRL REUS W/TWL XL LVL3 (GOWN DISPOSABLE) ×2
KIT BASIN OR (CUSTOM PROCEDURE TRAY) ×2 IMPLANT
KIT ROOM TURNOVER OR (KITS) ×2 IMPLANT
LIQUID BAND (GAUZE/BANDAGES/DRESSINGS) ×1 IMPLANT
MARKER SKIN DUAL TIP RULER LAB (MISCELLANEOUS) ×2 IMPLANT
NS IRRIG 1000ML POUR BTL (IV SOLUTION) ×4 IMPLANT
PACK GENERAL/GYN (CUSTOM PROCEDURE TRAY) ×2 IMPLANT
PAD ARMBOARD 7.5X6 YLW CONV (MISCELLANEOUS) ×2 IMPLANT
PREFILTER EVAC NS 1 1/3-3/8IN (MISCELLANEOUS) ×2 IMPLANT
SPONGE GAUZE 2X2 STER 10/PKG (GAUZE/BANDAGES/DRESSINGS) ×1
SUT ETHIBOND 2 0 SH (SUTURE) IMPLANT
SUT MNCRL AB 3-0 PS2 18 (SUTURE) ×3 IMPLANT
SUT PDS 2 0 CTB 1 36 (SUTURE) IMPLANT
SUT PDS AB 3-0 SH 27 (SUTURE) IMPLANT
SUT PROLENE 3 0 PS 2 (SUTURE) ×1 IMPLANT
SUT VIC AB 3-0 SH 18 (SUTURE) ×2 IMPLANT
SWAB COLLECTION DEVICE MRSA (MISCELLANEOUS) ×1 IMPLANT
SYR BULB IRRIGATION 50ML (SYRINGE) ×2 IMPLANT
TOWEL OR 17X24 6PK STRL BLUE (TOWEL DISPOSABLE) ×3 IMPLANT
TOWEL OR 17X26 10 PK STRL BLUE (TOWEL DISPOSABLE) ×2 IMPLANT
TRAY FOLEY CATH 14FRSI W/METER (CATHETERS) IMPLANT
TUBE ANAEROBIC SPECIMEN COL (MISCELLANEOUS) ×1 IMPLANT
TUBE CONNECTING 12X1/4 (SUCTIONS) ×2 IMPLANT

## 2014-12-13 NOTE — H&P (Signed)
I have re-evaluated and re-examined the patient and there are no changes. See office notes for H&P.  Planned procedure: Removal of right tissue expander for possible infection

## 2014-12-13 NOTE — Transfer of Care (Signed)
Immediate Anesthesia Transfer of Care Note  Patient: Sara [name]  Procedure(s) Performed: Procedure(s): REMOVAL OF RIGHT BREAST TISSUE EXPANDER (Right)  Patient Location: PACU  Anesthesia Type:General  Level of Consciousness: awake, alert , oriented and patient cooperative  Airway & Oxygen Therapy: Patient Spontanous Breathing and Patient connected to nasal cannula oxygen  Post-op Assessment: Report given to RN and Post -op Vital signs reviewed and stable  Post vital signs: Reviewed  Last Vitals:  Filed Vitals:   12/13/14 1145  BP: 113/57  Pulse: 74  Temp: 36.7 C  Resp: 18    Complications: No apparent anesthesia complications

## 2014-12-13 NOTE — Anesthesia Preprocedure Evaluation (Addendum)
Anesthesia Evaluation  Patient identified by MRN, date of birth, ID band Patient awake    Reviewed: Allergy & Precautions, NPO status , Patient's Chart, lab work & pertinent test results  Airway Mallampati: II  TM Distance: >3 FB Neck ROM: Full    Dental no notable dental hx.    Pulmonary sleep apnea , former smoker,  breath sounds clear to auscultation  Pulmonary exam normal       Cardiovascular hypertension, Pt. on medications Rhythm:Regular Rate:Normal     Neuro/Psych negative neurological ROS  negative psych ROS   GI/Hepatic negative GI ROS, Neg liver ROS, hiatal hernia, PUD, GERD-  ,  Endo/Other  Hypothyroidism Hyperthyroidism   Renal/GU negative Renal ROS  negative genitourinary   Musculoskeletal negative musculoskeletal ROS (+)   Abdominal   Peds negative pediatric ROS (+)  Hematology negative hematology ROS (+)   Anesthesia Other Findings   Reproductive/Obstetrics negative OB ROS                            Anesthesia Physical  Anesthesia Plan  ASA: III  Anesthesia Plan: General   Post-op Pain Management:    Induction: Intravenous  Airway Management Planned: Oral ETT and LMA  Additional Equipment:   Intra-op Plan:   Post-operative Plan: Extubation in OR  Informed Consent: I have reviewed the patients History and Physical, chart, labs and discussed the procedure including the risks, benefits and alternatives for the proposed anesthesia with the patient or authorized representative who has indicated his/her understanding and acceptance.   Dental advisory given  Plan Discussed with: CRNA and Surgeon  Anesthesia Plan Comments:         Anesthesia Quick Evaluation

## 2014-12-13 NOTE — Brief Op Note (Signed)
12/13/2014  2:44 PM  PATIENT:  [name]  69 y.o. female  PRE-OPERATIVE DIAGNOSIS:  BREAST CANCER and possible infection of tissue expander right chest  POST-OPERATIVE DIAGNOSIS:  BREAST CANCER and possible infection of tissue expander right chest  PROCEDURE:  Procedure(s): REMOVAL OF RIGHT BREAST TISSUE EXPANDER (Right)  SURGEON:  Surgeon(s) and Role:    * Crissie Reese, MD - Primary  PHYSICIAN ASSISTANT:   ASSISTANTS: Judyann Munson, RNFA   ANESTHESIA:   general  EBL:  Total I/O In: 1200 [I.V.:1200] Out: -   BLOOD ADMINISTERED:none  DRAINS: (1) Jackson-Pratt drain(s) with closed bulb suction in the right chest   LOCAL MEDICATIONS USED:  NONE  SPECIMEN:  Source of Specimen:  right mastectomy scar  DISPOSITION OF SPECIMEN:  PATHOLOGY  COUNTS:  YES  TOURNIQUET:  * No tourniquets in log *  DICTATION: .Other Dictation: Dictation Number [id number]  PLAN OF CARE: Admit for overnight observation  PATIENT DISPOSITION:  PACU - hemodynamically stable.   Delay start of Pharmacological VTE agent (>24hrs) due to surgical blood loss or risk of bleeding: no

## 2014-12-13 NOTE — Consult Note (Signed)
PHARMACY CONSULT NOTE--POST-PROCEDURE ANTIBIOTICS   Consult  :  Post Procedure Dosing of:  Vancomycin Indication :  Empiric Post-Procedure Prophylaxis   Pharmacy consulted for post-procedure dosing of Vancomycin s/p removal of possibly infected R-Breast tissue expander.    Patient is to be observed overnight.  Will cover patient for 24 hours [2 doses]  Wt 98 kg,  CrCl > 80 ml/min.  PLAN:  1. Will give Vancomycin 1 gm IV q 12 hours x 2, then discontinue..  No additional dosing adjustments required. 2. Pharmacy will sign off. Please reconsult if additional assistance is needed or if IV antibiotic therapy is extended.  Thank you for allowing Pharmacy to participate in this patient's care.   Caylen Yardley, Craig Guess,  Pharm.D.,  12/13/2014,  4:37 PM

## 2014-12-13 NOTE — Anesthesia Procedure Notes (Signed)
Procedure Name: LMA Insertion Date/Time: 12/13/2014 1:31 PM Performed by: Luciana Axe K Pre-anesthesia Checklist: Patient identified, Emergency Drugs available, Suction available, Patient being monitored and Timeout performed Patient Re-evaluated:Patient Re-evaluated prior to inductionOxygen Delivery Method: Circle system utilized Preoxygenation: Pre-oxygenation with 100% oxygen Intubation Type: IV induction Ventilation: Mask ventilation without difficulty LMA: LMA inserted LMA Size: 4.0 Number of attempts: 1 Placement Confirmation: positive ETCO2,  CO2 detector and breath sounds checked- equal and bilateral Tube secured with: Tape Dental Injury: Teeth and Oropharynx as per pre-operative assessment

## 2014-12-14 DIAGNOSIS — L905 Scar conditions and fibrosis of skin: Secondary | ICD-10-CM | POA: Diagnosis not present

## 2014-12-14 DIAGNOSIS — Z9011 Acquired absence of right breast and nipple: Secondary | ICD-10-CM | POA: Diagnosis not present

## 2014-12-14 DIAGNOSIS — Z853 Personal history of malignant neoplasm of breast: Secondary | ICD-10-CM | POA: Diagnosis not present

## 2014-12-14 MED ORDER — HYDROMORPHONE HCL 2 MG PO TABS: 2.0000 mg | ORAL_TABLET | ORAL | Status: DC | PRN

## 2014-12-14 MED ORDER — METHOCARBAMOL 500 MG PO TABS: 500.0000 mg | ORAL_TABLET | Freq: Four times a day (QID) | ORAL | Status: DC

## 2014-12-14 NOTE — Discharge Instructions (Addendum)
No lifting for 6 weeks No vigorous activity for 6 weeks (including outdoor walks) No driving for 4 weeks OK to walk up stairs slowly Stay propped up Use incentive spirometer at home every hour while awake No shower while drains are in place Empty drains at least three times a day and record the amounts separately Change drain dressings every third day if instructed to do so by Dr. Harlow Mares  Apply Bacitracin antibiotic ointment to the drain sites  Place gauze dressing over drains  Secure the gauze with tape Take an over-the-counter Probiotic while on antibiotics Take an over-the-counter stool softener (such as Colace) while on pain medication See Dr. Harlow Mares in office at end of next week. Call [phone] to set up an appointment For questions call [phone] or [phone]

## 2014-12-14 NOTE — Progress Notes (Signed)
Sara [name] to be D/C'd Home per MD order.  Discussed with the patient and all questions fully answered.  VSS, Surgical incision site clean, dry, intact with no sign of infection.   IV catheter discontinued intact.Dressing and pressure applied.  An After Visit Summary was printed and given to the patient. Patient received prescription.  D/c education completed with patient/family including follow up instructions, medication list, d/c activities limitations if indicated, with other d/c instructions as indicated by MD - patient able to verbalize understanding, all questions fully answered.   Patient instructed to return to ED, call 911, or call MD for any changes in condition.   Patient escorted via [place], and D/C home via private auto.  Micki Riley 12/14/2014 11:08 AM

## 2014-12-14 NOTE — Anesthesia Postprocedure Evaluation (Signed)
Anesthesia Post Note  Patient: Sara [name]  Procedure(s) Performed: Procedure(s) (LRB): REMOVAL OF RIGHT BREAST TISSUE EXPANDER (Right)  Anesthesia type: General  Patient location: PACU  Post pain: Pain level controlled  Post assessment: Post-op Vital signs reviewed  Last Vitals: BP 101/55 mmHg  Pulse 64  Temp(Src) 36.6 C (Oral)  Resp 16  Ht 5\' 6"  (1.676 m)  Wt 216 lb (97.977 kg)  BMI 34.88 kg/m2  SpO2 95%  Post vital signs: Reviewed  Level of consciousness: sedated  Complications: No apparent anesthesia complications

## 2014-12-14 NOTE — Op Note (Signed)
NAMEMarland Kitchen  TAMORAH, HADA NO.:  [id number]  MEDICAL RECORD NO.:  [id number]  LOCATION:                               FACILITY:  [facility]  PHYSICIAN:  Crissie Reese, M.D.     DATE OF BIRTH:  05-24-1946  DATE OF PROCEDURE:  12/13/2014 DATE OF DISCHARGE:  12/13/2014                              OPERATIVE REPORT   PREOPERATIVE DIAGNOSIS:  Breast cancer with possible infection of the right chest  tissue expander.  POSTOPERATIVE DIAGNOSIS:  Breast cancer with possible infection of the right chest tissue expander.  TITLE OF PROCEDURE:  Removal of right chest tissue expander.  SURGEON:  Crissie Reese, M.D.  ASSISTANT:  Judyann Munson, RNFA.  ESTIMATED BLOOD LOSS:  25 mL.  DRAINS:  One 19-French.  CLINICAL NOTE:  [name], who had a mastectomy for breast cancer three weeks ago.  She had immediate breast reconstruction with a tissue expander as well as acellular dermal matrix.  She had done well until a few days ago when she developed some change in the drainage from the one remaining drain tube.  It increased and it became little bit cloudy.  She also drained a little bit of fluid through the incision. It was felt that this likely represented a problem with the tissue expander in terms of either a lot of inflammatory fluid or a low-grade infection.  Her white blood cell count is normal.  She had a little bit of fever for one night but that resolved.  She did develop a little bit of tenderness over the right chest wall.  No erythema.  All this was discussed with her in detail.  She understood that there was a chance that this reconstruction could be salvaged with an irrigation and exchange of tissue expanders.  She declined this and preferred just to be finished with this for the present time.  She understood the earliest that this could be replaced would be around 6 months from now.  She accepted this as well as the contour deformities from mastectomy  without reconstruction and the risks of bleeding, infection, healing problems, scarring, fluid accumulations, anesthesia related complications, DVT, PE,  and damage to the chest wall as well as deeper structures including pneumothorax and overall chronic pain and disappointment, as well as the possibility of secondary procedures.  She accepted all of these possible complications and understood that this was not an all inclusive list. She wished to proceed.  DESCRIPTION OF PROCEDURE:  The patient was marked in the holding area and then taken the operating room and placed supine.  After successful induction of general anesthesia, she was prepped with Betadine and draped with sterile drapes.  A portion of the skin was then excised because it would be excess skin given the presence of the tissue expander.  Dissection was then carried down to the muscle and this piece of skin was removed.  Dissection was then carried through the muscle and the underlying tissue expander was identified.  The space looked fairly good, although there was some biofilm and a little bit of murky drainage, especially in the medial aspect.  The ADM did not look like it had incorporated fully.  It was removed.  She was debrided with just a curette and through irrigation with saline and then some antibiotic solution.  This space looked very clean.  A 19-French drain was positioned and brought through separate stab wound inferolaterally and secured with a 3-0 Prolene suture.  Excellent stasis was confirmed.  The closure of the muscle with 3-0 Monocryl simple interrupted sutures followed by 3-0 Monocryl interrupted inverted deep dermal sutures and few 3-0 Prolene simple sutures as needed.  Dermabond was applied.  Dry sterile dressing in the chest vest and the drain was dressed with a Biopatch and SorbaView, and she was transferred to recovery room in stable having tolerated the procedure well.  ADDENDUM:  Cultures were  taken prior to the debridement.     Crissie Reese, M.D.     DB/MEDQ  D:  12/13/2014  T:  12/14/2014  Job:  [id number]  cc:   Crissie Reese, M.D.

## 2014-12-14 NOTE — Discharge Summary (Signed)
Physician Discharge Summary  Patient ID: Sara [name] MRN: [id number] DOB/AGE: Feb 09, 1946 69 y.o.  Admit date: 12/13/2014 Discharge date: 12/14/2014  Admission Diagnoses: breast cancer with possible infection of right tissue expander  Discharge Diagnoses: Same Active Problems:   Breast cancer   Discharged Condition: good  Hospital Course: On the day of admission the patient was taken to surgery and had removal of tissue expander. The patient tolerated the procedures well. Postoperatively, the incision looks good and no evidence of cellulitis. The patient was ambulatory and tolerating diet on the first postoperative day. She is ready for discharge.  Treatments: antibiotics: vancomycin, anticoagulation: LMW heparin and surgery: removal of tissue expander  Discharge Exam: Blood pressure 101/55, pulse 64, temperature 97.9 F (36.6 C), temperature source Oral, resp. rate 16, height 5\' 6"  (1.676 m), weight 216 lb (97.977 kg), SpO2 95 %.  Operative sites: Mastectomy flaps viable without cellulitis. Drain functioning. Drainage thin and non-purulent  Disposition: 06-Home-Health Care Svc     Medication List    STOP taking these medications        cephALEXin 500 MG capsule  Commonly known as:  KEFLEX     docusate sodium 100 MG capsule  Commonly known as:  COLACE     enoxaparin 40 MG/0.4ML injection  Commonly known as:  LOVENOX     oxyCODONE-acetaminophen 5-325 MG per tablet  Commonly known as:  PERCOCET/ROXICET      TAKE these medications        acetaminophen 500 MG tablet  Commonly known as:  TYLENOL  Take 1,000 mg by mouth every 6 (six) hours as needed for moderate pain.     diphenhydrAMINE 25 MG tablet  Commonly known as:  SOMINEX  Take 50 mg by mouth at bedtime as needed for allergies or sleep.     doxycycline 100 MG DR capsule  Commonly known as:  DORYX  Take 100 mg by mouth 2 (two) times daily.     fluticasone 50 MCG/ACT nasal spray  Commonly known as:   FLONASE  Place 2 sprays into the nose at bedtime.     gabapentin 600 MG tablet  Commonly known as:  NEURONTIN  1/2 tab during the day and one tab at bedtime     HYDROmorphone 2 MG tablet  Commonly known as:  DILAUDID  Take 1 tablet (2 mg total) by mouth every 4 (four) hours as needed for moderate pain.     levothyroxine 88 MCG tablet  Commonly known as:  SYNTHROID, LEVOTHROID  Take 88 mcg by mouth daily before breakfast.     LORazepam 1 MG tablet  Commonly known as:  ATIVAN  Take 1 mg by mouth at bedtime as needed for sleep. For sleep     losartan-hydrochlorothiazide 100-12.5 MG per tablet  Commonly known as:  HYZAAR  Take 1 tablet by mouth daily.     methocarbamol 500 MG tablet  Commonly known as:  ROBAXIN  Take 1 tablet (500 mg total) by mouth 4 (four) times daily.     metoprolol tartrate 25 MG tablet  Commonly known as:  LOPRESSOR  Take 25 mg by mouth 2 (two) times daily.     omeprazole 40 MG capsule  Commonly known as:  PRILOSEC  Take 40 mg by mouth daily before breakfast.     polyethylene glycol packet  Commonly known as:  MIRALAX / GLYCOLAX  Take 17 g by mouth daily.     pravastatin 40 MG tablet  Commonly known as:  PRAVACHOL  Take 40 mg by mouth daily with breakfast.     tamoxifen 20 MG tablet  Commonly known as:  NOLVADEX  Take 1 tablet (20 mg total) by mouth daily.         SignedHarlow Mares, Robert Sperl M 12/14/2014, 10:41 AM

## 2014-12-16 LAB — WOUND CULTURE

## 2014-12-17 ENCOUNTER — Encounter (HOSPITAL_COMMUNITY): Payer: Self-pay | Admitting: Plastic Surgery

## 2014-12-18 LAB — ANAEROBIC CULTURE

## 2014-12-20 ENCOUNTER — Other Ambulatory Visit (HOSPITAL_COMMUNITY): Payer: Self-pay | Admitting: Plastic Surgery

## 2015-01-03 ENCOUNTER — Other Ambulatory Visit: Payer: Self-pay | Admitting: *Deleted

## 2015-01-03 ENCOUNTER — Telehealth: Payer: Self-pay | Admitting: Orthopedic Surgery

## 2015-01-03 MED ORDER — GABAPENTIN 600 MG PO TABS: ORAL_TABLET | ORAL | Status: DC

## 2015-01-03 NOTE — Telephone Encounter (Signed)
Patient called regarding medication:  gabapentin (NEURONTIN) 600 MG tablet [[id number]]  - states that she is running out of it due to Dr Aline Brochure increasing dosage. She requests refill, through her mail-in pharmacy per her insurance; may need 90 day supply as well.  Please advise.  Patient ph# [phone]

## 2015-01-03 NOTE — Telephone Encounter (Signed)
Ok to refill 

## 2015-01-03 NOTE — Telephone Encounter (Signed)
done

## 2015-01-03 NOTE — Telephone Encounter (Signed)
YES

## 2015-01-09 DIAGNOSIS — C50919 Malignant neoplasm of unspecified site of unspecified female breast: Secondary | ICD-10-CM | POA: Diagnosis not present

## 2015-01-16 ENCOUNTER — Other Ambulatory Visit (HOSPITAL_COMMUNITY): Payer: Self-pay

## 2015-01-17 ENCOUNTER — Ambulatory Visit (INDEPENDENT_AMBULATORY_CARE_PROVIDER_SITE_OTHER): Payer: Medicare Other | Admitting: Orthopedic Surgery

## 2015-01-17 ENCOUNTER — Other Ambulatory Visit (HOSPITAL_COMMUNITY): Payer: Self-pay | Admitting: Oncology

## 2015-01-17 VITALS — BP 114/70 | Ht 66.0 in | Wt 216.0 lb

## 2015-01-17 DIAGNOSIS — M7150 Other bursitis, not elsewhere classified, unspecified site: Secondary | ICD-10-CM | POA: Diagnosis not present

## 2015-01-17 DIAGNOSIS — C50411 Malignant neoplasm of upper-outer quadrant of right female breast: Secondary | ICD-10-CM

## 2015-01-17 DIAGNOSIS — M7050 Other bursitis of knee, unspecified knee: Secondary | ICD-10-CM

## 2015-01-17 MED ORDER — OXYCODONE-ACETAMINOPHEN 5-325 MG PO TABS: 1.0000 | ORAL_TABLET | Freq: Four times a day (QID) | ORAL | Status: DC | PRN

## 2015-01-17 NOTE — Progress Notes (Signed)
Chief Complaint  Patient presents with  . Follow-up    3 month recheck on left knee, DOS 08-08-14.    Almost 6 months status post left knee replacement also had right knee replacement prior to that presents with bilateral knee pain on the medial side of the knee associated with swelling  No trauma  No fever chills or night sweats  Patient name bleeding well does has medial pain  System review otherwise negative  Exam vital signs stable appearance is normal she's a mature without assistive devices 2 swollen areas over the bursa of the medial side of the knee with tenderness. Both knees are stable in extension and flexion with excellent range of motion strength alignment and stability  Skin normal no infection is noted no effusion  Impression status post bilateral total knees Bilateral has bursitis  Recommend neuropathic cream from [facility] preparation #3 Ice 3 times a day Refill Percocet patient going on a trip  Follow-up 3 months

## 2015-01-17 NOTE — Patient Instructions (Signed)
Ice each knee three times daily for 20 minutes

## 2015-01-20 IMAGING — US US EXTREM LOW VENOUS*R*
1 series · 13 of 24 positions shown · non-contrast
Comparison: None.

CLINICAL DATA: Recent right total knee joint replacement now with
pain and swelling in the right lower extremity



[Series 1: us extrem low venous*right* · 0.08mm/px · 13 of 42 slices shown]
[im 1/42]
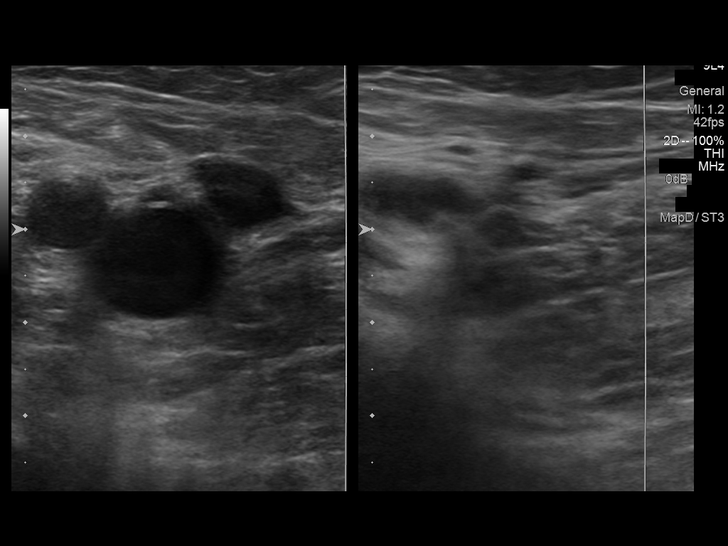
[im 4/42]
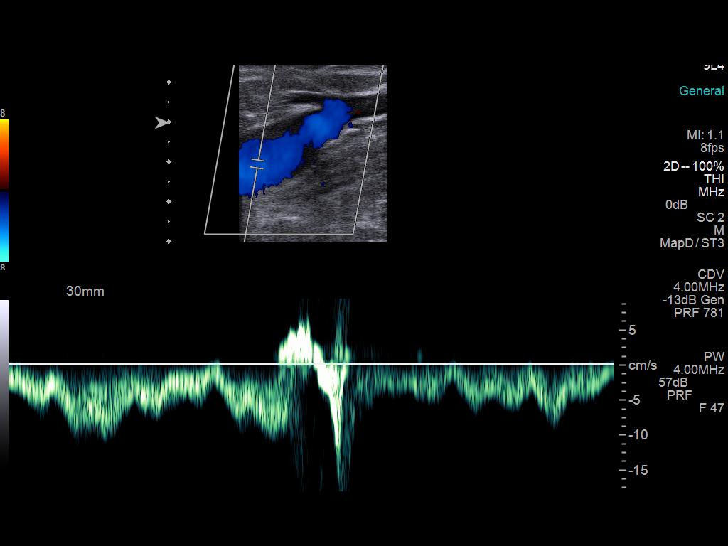
[im 8/42]
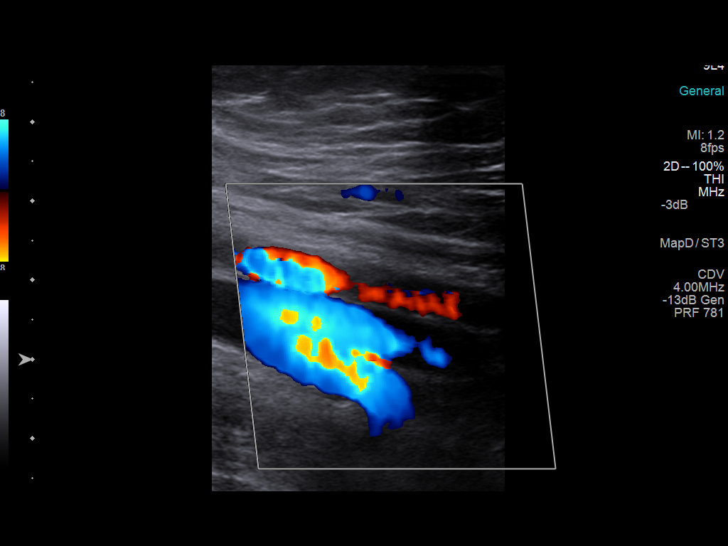
[im 11/42]
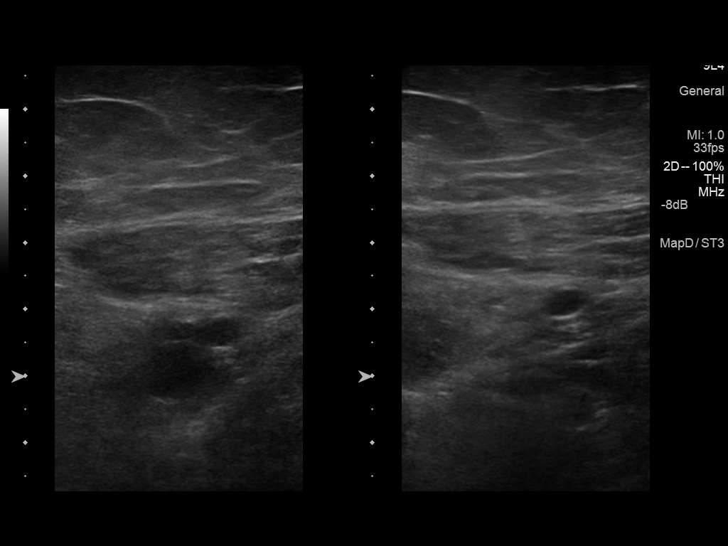
[im 15/42]
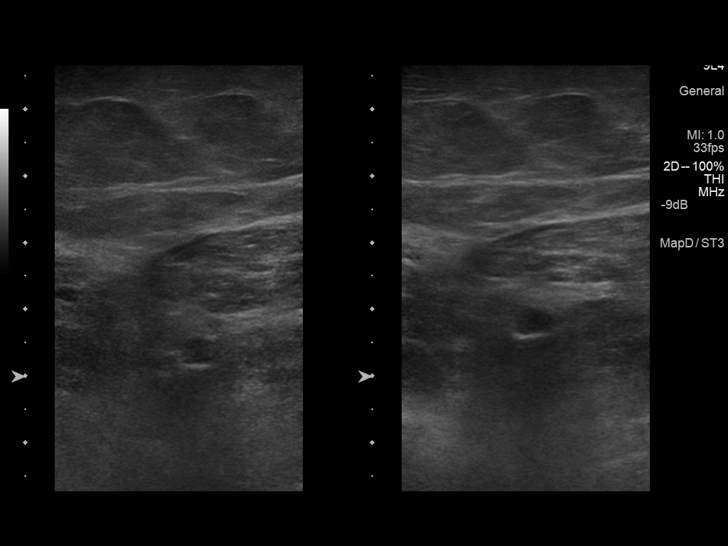
[im 18/42]
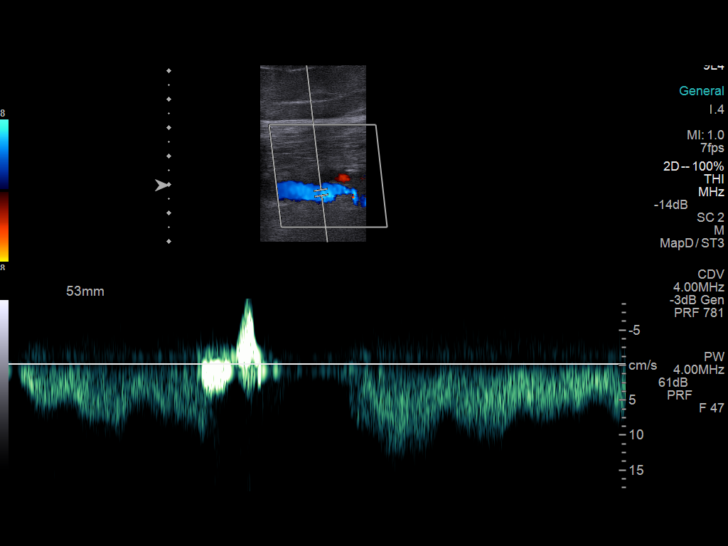
[im 22/42]
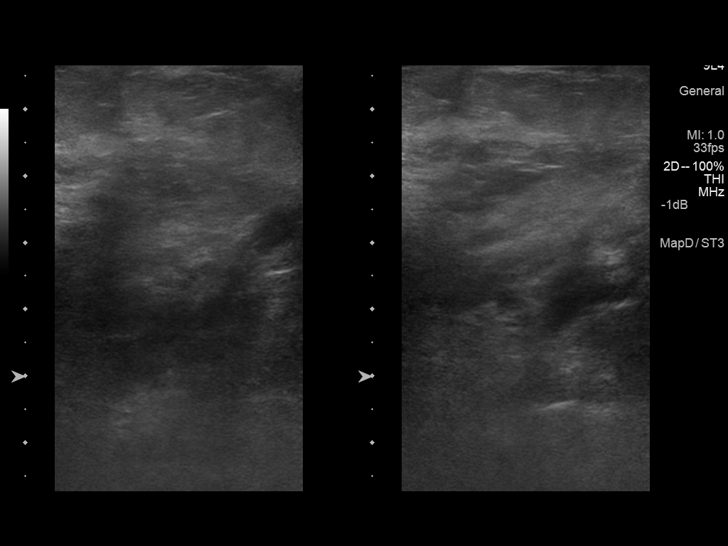
[im 24/42]
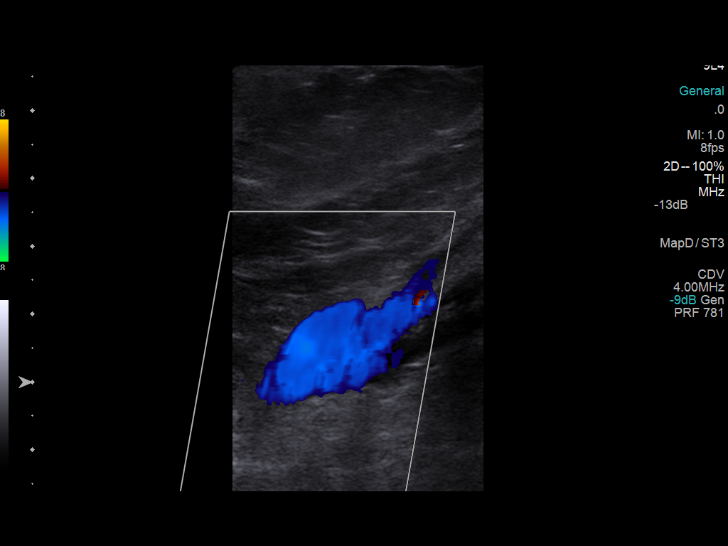
[im 27/42]
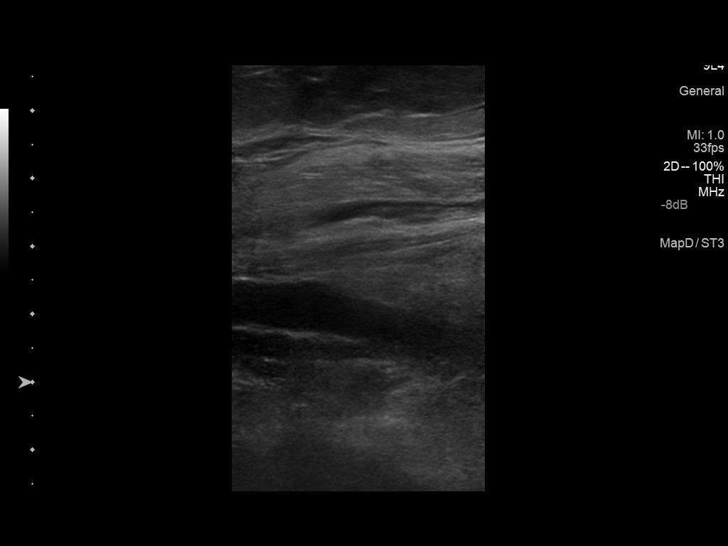
[im 31/42]
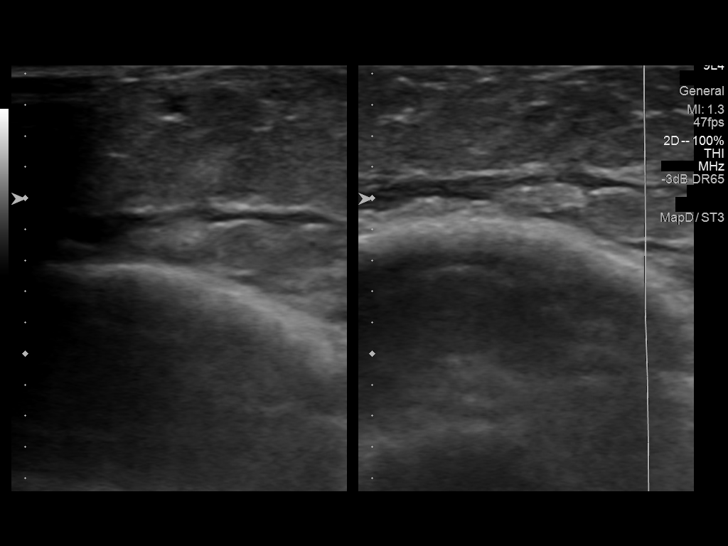
[im 34/42]
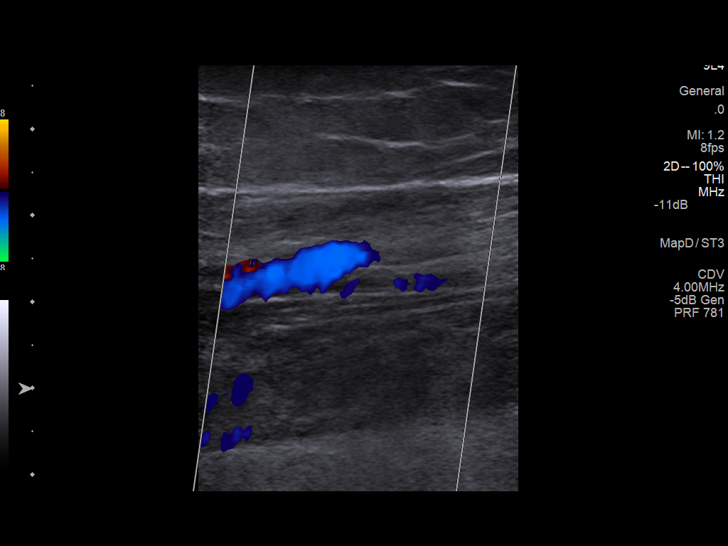
[im 38/42]
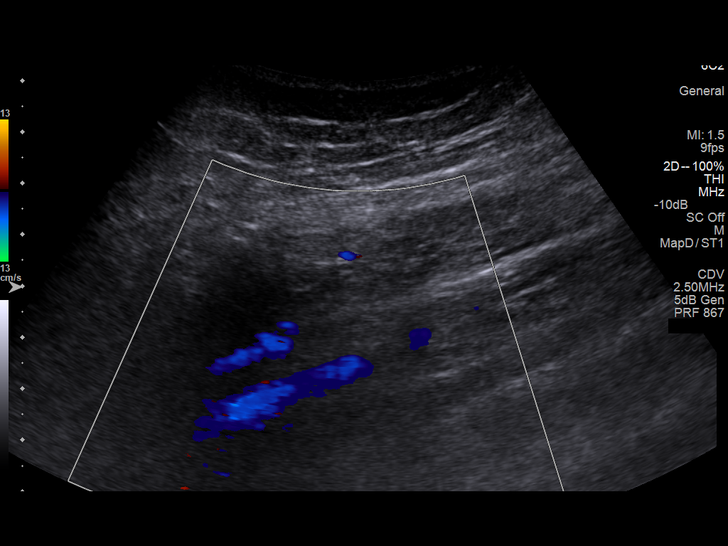
[im 42/42]
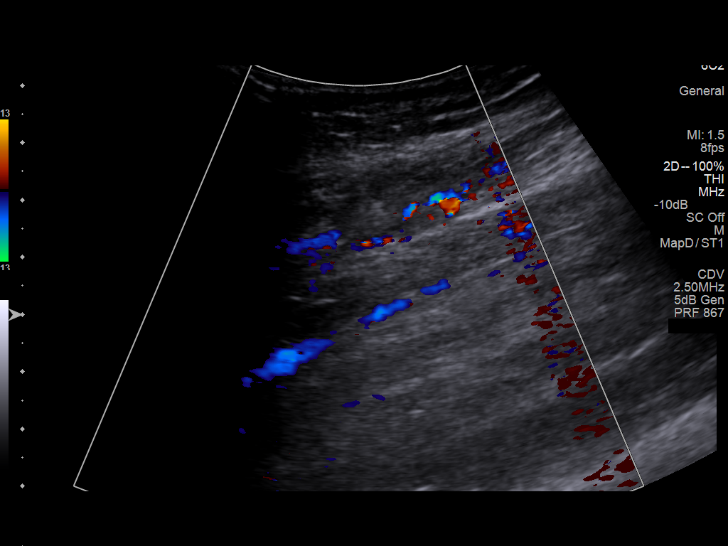

[13 of 24 positions shown; findings below may reference images not displayed]

FINDINGS: Common Femoral Vein: No evidence of thrombus. Normal
compressibility, respiratory phasicity and response to augmentation.

Saphenofemoral Junction: No evidence of thrombus. Normal
compressibility and flow on color Doppler imaging.

Profunda Femoral Vein: No evidence of thrombus. Normal
compressibility and flow on color Doppler imaging.

Femoral Vein: No evidence of thrombus. Normal compressibility,
respiratory phasicity and response to augmentation.

Popliteal Vein: No evidence of thrombus. Normal compressibility,
respiratory phasicity and response to augmentation.

Calf Veins: No evidence of thrombus. Normal compressibility and flow
on color Doppler imaging.

Superficial Great Saphenous Vein: No evidence of thrombus. Normal
compressibility and flow on color Doppler imaging.

Venous Reflux:  None.

Other Findings:  None.
IMPRESSION: No evidence of deep venous thrombosis in the right lower extremity.
No superficial thrombosis is demonstrated either.

## 2015-01-21 ENCOUNTER — Other Ambulatory Visit (HOSPITAL_COMMUNITY): Payer: Medicare Other

## 2015-01-21 ENCOUNTER — Ambulatory Visit (HOSPITAL_COMMUNITY): Payer: Medicare Other | Admitting: Oncology

## 2015-01-21 NOTE — Assessment & Plan Note (Addendum)
Extensive DCIS/LCIS of right breast, S/P simple right mastectomy and SLN which was negative for metastatic disease on 11/20/2014 by Dr. Brantley Stage.  Beginning Tamoxifen daily as chemoprevention on 01/01/2015  Return in 3 months for follow-up with labs: CBC diff, CMET.

## 2015-01-21 NOTE — Progress Notes (Signed)
Maggie Font, MD [address] [place] [address]  Malignant neoplasm of upper outer quadrant of female breast, right - Plan: Comprehensive metabolic panel, CBC with Differential  CURRENT THERAPY: Tamoxifen daily beginning on 01/01/2015  INTERVAL HISTORY: Sara [name] 69 y.o. female returns for followup of extensive DCIS/LCIS of right breast, S/P simple right mastectomy and SLN which was negative for metastatic disease on 11/20/2014 by Dr. Brantley Stage.      Malignant neoplasm of upper outer quadrant of female breast   09/05/2014 Imaging assymetry noted in R breast on screening mammogram   09/18/2014 Imaging Irregular mass in the 12 o'clock location of the right breast. Tissue diagnosis is recommended.   09/18/2014 Imaging IMPRESSION: Irregular mass in the 12 o'clock location of the right breast. Tissue diagnosis is recommended.   10/09/2014 Imaging 8 x 12 x 8 CM abnormal linear clumped enhancement throughout the majority of the central and upper right breast.   10/17/2014 Initial Biopsy Breast, right, needle core biopsy, central - DUCTAL CARCINOMA IN SITU, MIXED PATTERNS (CRIBRIFORM, COMEDO AND PAPILLARY), INVOLVING MULTIPLE CORES, AT LEAST 9 MM IN MAXIMAL EXTENT IN ANY ONE CORE. - FOCAL LOBULAR NEOPLASIA (ALH/LCIS). - NO INVASIVE CAR   11/20/2014 Definitive Surgery Breast, simple mastectomy, right - DUCTAL CARCINOMA IN SITU, SEE COMMENT. - LOBULAR CARCINOMA IN SITU (CONVENTIONAL AND PLEOMORPHIC TYPES). - PREVIOUS BIOPSY SITES (X2). - IN SITU CARCINOMA IS 3CM FROM NEAREST MARGIN (DEEP).   01/01/2015 -  Anti-estrogen oral therapy Tamoxifen daily.    I personally reviewed and went over laboratory results with the patient.  The results are noted within this dictation.  I personally reviewed and went over radiographic studies with the patient.  The results are noted within this dictation.    She is tolerating Tamoxifen well.  She denies any hot flashes, increased arthralgias and  myalgias.   Oncologically, she denies any complaints and ROS questioning is negative.   Past Medical History  Diagnosis Date  . Pure hypercholesterolemia     takes Pravastin daily  . Allergic rhinitis due to pollen   . Cervicalgia   . Diverticulosis of colon (without mention of hemorrhage)   . Morbid obesity   . Palpitations   . Peptic ulcer, unspecified site, unspecified as acute or chronic, without mention of hemorrhage, perforation, or obstruction   . Shortness of breath   . H/O hiatal hernia   . HTN (hypertension)     takes Metoprolol and Lisinopril daily  . Hypertension   . GERD (gastroesophageal reflux disease)     takes Omeprazole daily  . Constipation     takes Miralax daily  . Insomnia     takes Ativan nigtly  . Other postablative hypothyroidism     takes SYnthroid daily  . Joint pain     "all of them"  . Joint swelling     "knees, legs, ankles sometimes" (11/20/2014)  . Sleep apnea     slight but doesn't require a CPAP (11/20/2014)  . Graves' disease     "took a pill to correct"  . Headache(784.0)     denies migraines since the 80's but has occ and takes Butalbital prn  . Cervical spondylosis without myelopathy     all over  . Degeneration of cervical intervertebral disc   . Primary localized osteoarthrosis, lower leg   . Arthritis     "knees, back, right elbow; left shoulder; right ankle" (11/20/2014)  . Chronic lower back  pain   . Anxiety   . Depression   . Breast cancer, right breast     "DCIS; zero stage" S/P mastectomy 11/20/2014  . Tendonitis of knee     bilateral    has HYPOTHYROIDISM; HYPERLIPIDEMIA; OBESITY NOS; MORBID OBESITY; BENIGN POSITIONAL VERTIGO; HYPERTENSION; ALLERGIC RHINITIS, SEASONAL; GERD; PUD; DIVERTICULOSIS, COLON; OVERACTIVE BLADDER; FIBROCYSTIC BREAST DISEASE; OSTEOARTHRITIS; KNEE, ARTHRITIS, DEGEN./OSTEO; H N P-LUMBAR; SPINAL STENOSIS, CERVICAL; SPINAL STENOSIS, LUMBAR; BACK PAIN; ANSERINE BURSITIS; Patellar tendinitis; PALPITATIONS;  CHEST PAIN, RECURRENT; OTHER DYSPHAGIA; TEAR LATERAL MENISCUS; LIVER FUNCTION TESTS, ABNORMAL, HX OF; INTERMITTENT VERTIGO; GERD (gastroesophageal reflux disease); Dysphagia; RUQ pain; Fatty liver; Hematochezia; OA (osteoarthritis) of knee; Patellar tendonitis; Chest pain; Radicular pain of right lower extremity; Knee instability; Right knee pain; S/P arthroscopy of right knee; Abnormality of gait; Difficulty in walking(719.7); Postoperative stiffness of total knee replacement; Pain in joint, lower leg; S/P total knee replacement; Malignant neoplasm of upper outer quadrant of female breast; Breast cancer; and Breast cancer, right breast on her problem list.     is allergic to propoxyphene hcl; pollen extract; camphor; dust mite extract; latex; molds & smuts; and tomato.  Ms. Sorter does not currently have medications on file.  Past Surgical History  Procedure Laterality Date  . Knee arthroscopy Right   . Appendectomy    . Colonoscopy  Sept 2008    RMR: normal rectum, left-sided diverticula, repeat in 2018  . Abdominal hysterectomy    . Upper gastrointestinal endoscopy    . Cataract extraction w/ intraocular lens  implant, bilateral Bilateral   . Lumpectomy on pelvis      "mass of nerves"  . Diagnostic laparoscopy    . Tubal ligation    . Esophagogastroduodenoscopy    . Anterior lat lumbar fusion  05/13/2012    Procedure: ANTERIOR LATERAL LUMBAR FUSION 2 LEVELS;  Surgeon: Faythe Ghee, MD;  Location: [facility];  Service: Neurosurgery;  Laterality: Left;  Left Lumbar Three-four,Lumbar four-five Extreme Lumbar Interbody Fusion with Percutaneous Pedicle Screws  . Knee arthroscopy with lateral menisectomy Right 10/06/2013    Procedure: KNEE ARTHROSCOPY WITH LATERAL AND MEDIAL MENISECTOMY;  Surgeon: Carole Civil, MD;  Location: [facility];  Service: Orthopedics;  Laterality: Right;  END @ 1234  . Foreign body removal Right 10/06/2013    Procedure: REMOVAL FOREIGN BODY EXTREMITY;  Surgeon:  Carole Civil, MD;  Location: [facility];  Service: Orthopedics;  Laterality: Right;  . Injection knee Left 10/06/2013    Procedure: KNEE INJECTION;  Surgeon: Carole Civil, MD;  Location: [facility];  Service: Orthopedics;  Laterality: Left;  . Total knee arthroplasty Right 01/24/2014    Procedure: TOTAL KNEE ARTHROPLASTY;  Surgeon: Carole Civil, MD;  Location: [facility];  Service: Orthopedics;  Laterality: Right;  . Total knee arthroplasty Left 08/08/2014    Procedure: TOTAL KNEE ARTHROPLASTY;  Surgeon: Carole Civil, MD;  Location: [facility];  Service: Orthopedics;  Laterality: Left;  Marland Kitchen Mastectomy complete / simple w/ sentinel node biopsy Right 11/20/2014    axillary  . Breast reconstruction with placement of tissue expander and flex hd (acellular hydrated dermis) Right 11/20/2014  . Joint replacement    . Tonsillectomy    . Dilation and curettage of uterus    . Breast lumpectomy Left 1988  . Breast biopsy Left ~ 2014  . Breast biopsy Right 2015  . Simple mastectomy with axillary sentinel node biopsy Right 11/20/2014    Procedure: SIMPLE MASTECTOMY WITH AXILLARY SENTINEL NODE BIOPSY;  Surgeon:  Erroll Luna, MD;  Location: [facility];  Service: General;  Laterality: Right;  . Breast reconstruction with placement of tissue expander and flex hd (acellular hydrated dermis) Right 11/20/2014    Procedure: RIGHT BREAST RECONSTRUCTION WITH PLACEMENT OF TISSUE EXPANDER AND ACELLULAR DERMA MATRIX ([place]);  Surgeon: Crissie Reese, MD;  Location: [facility];  Service: Plastics;  Laterality: Right;  . Tissue expander removal Right 12/13/2014    dr Harlow Mares  . Removal of tissue expander and placement of implant Right 12/13/2014    Procedure: REMOVAL OF RIGHT BREAST TISSUE EXPANDER;  Surgeon: Crissie Reese, MD;  Location: [facility];  Service: Plastics;  Laterality: Right;    Denies any headaches, dizziness, double vision, fevers, chills, night sweats, nausea, vomiting, diarrhea, constipation, chest pain,  heart palpitations, shortness of breath, blood in stool, black tarry stool, urinary pain, urinary burning, urinary frequency, hematuria.   PHYSICAL EXAMINATION  ECOG PERFORMANCE STATUS: 0 - Asymptomatic  Filed Vitals:   01/22/15 1214  BP: 126/68  Pulse: 61  Temp: 98.2 F (36.8 C)  Resp: 18    GENERAL:alert, healthy, no distress, well nourished, well developed, comfortable, cooperative, smiling and accompanied by husband SKIN: skin color, texture, turgor are normal, no rashes or significant lesions HEAD: Normocephalic, No masses, lesions, tenderness or abnormalities EYES: normal, PERRLA, EOMI, Conjunctiva are pink and non-injected EARS: External ears normal OROPHARYNX:lips, buccal mucosa, and tongue normal and palatal petechiae  NECK: supple, no adenopathy, thyroid normal size, non-tender, without nodularity, no stridor, non-tender, trachea midline LYMPH:  no palpable lymphadenopathy BREAST:left breast normal without mass, skin or nipple changes or axillary nodes, right post-mastectomy site well healed and free of suspicious changes LUNGS: clear to auscultation and percussion HEART: regular rate & rhythm, no murmurs, no gallops, S1 normal and S2 normal ABDOMEN:abdomen soft, non-tender, obese, normal bowel sounds and no masses or organomegaly BACK: Back symmetric, no curvature. EXTREMITIES:less then 2 second capillary refill, no joint deformities, effusion, or inflammation, no skin discoloration, no clubbing, no cyanosis  NEURO: alert & oriented x 3 with fluent speech, no focal motor/sensory deficits, gait normal   LABORATORY DATA: CBC    Component Value Date/Time   WBC 7.5 12/13/2014 1209   RBC 3.88 12/13/2014 1209   HGB 11.8* 12/13/2014 1209   HCT 35.3* 12/13/2014 1209   PLT 215 12/13/2014 1209   MCV 91.0 12/13/2014 1209   MCH 30.4 12/13/2014 1209   MCHC 33.4 12/13/2014 1209   RDW 14.7 12/13/2014 1209   LYMPHSABS 2.5 11/12/2014 1530   MONOABS 0.6 11/12/2014 1530    EOSABS 0.1 11/12/2014 1530   BASOSABS 0.0 11/12/2014 1530      Chemistry      Component Value Date/Time   NA 140 12/13/2014 1209   NA 142 08/19/2011 0826   K 4.4 12/13/2014 1209   K 4.2 08/19/2011 0826   CL 104 12/13/2014 1209   CL 101 08/19/2011 0826   CO2 23 12/13/2014 1209   BUN 5* 12/13/2014 1209   CREATININE 0.75 12/13/2014 1209      Component Value Date/Time   CALCIUM 9.0 12/13/2014 1209   CALCIUM 9.4 08/19/2011 0826   ALKPHOS 73 11/12/2014 1530   ALKPHOS 98 08/19/2011 0826   AST 29 11/12/2014 1530   AST 30 08/19/2011 0826   ALT 19 11/12/2014 1530   BILITOT 0.5 11/12/2014 1530   BILITOT 0.5 05/13/2011 0831        ASSESSMENT AND PLAN:  Malignant neoplasm of upper outer quadrant of female breast Extensive  DCIS/LCIS of right breast, S/P simple right mastectomy and SLN which was negative for metastatic disease on 11/20/2014 by Dr. Brantley Stage.  Beginning Tamoxifen daily as chemoprevention on **  Return in 3 months for follow-up with labs: CBC diff, CMET.     THERAPY PLAN:  NCCN guidelines recommends the following for DCIS postsurgical treatment:  A. Risk reduction therapy for ipsilateral breast-conserving surgery:   1. Consider endocrine therapy for 5 years:    A. Patient treated with breast-conserving therapy (lumpectomy) and radiation therapy (category 1), especially for those with ER-positive DCIS    B. The benefit of endocrine therapy for ER-negative DCIS is uncertain    C. Patients treated with excision alone   2. Endocrine therapy:    A. Tamoxifen for premenopausal patients    B. Tamoxifen or aromatase inhibitor for postmenopausal patients with some advantage for aromatase inhibitor therapy in patients < 37 years old or with concerns for thromboembolism  B. Risk reduction therapy for contralateral breast   1. Counseling regarding risk reduction.   All questions were answered. The patient knows to call the clinic with any problems, questions or concerns. We can  certainly see the patient much sooner if necessary.  Patient and plan discussed with Dr. Ancil Linsey and she is in agreement with the aforementioned.   This note is electronically signed by: Robynn Pane 01/22/2015 12:39 PM

## 2015-01-22 ENCOUNTER — Encounter (HOSPITAL_COMMUNITY): Payer: Self-pay | Admitting: Oncology

## 2015-01-22 ENCOUNTER — Encounter (HOSPITAL_COMMUNITY): Payer: Medicare Other | Attending: Hematology & Oncology | Admitting: Oncology

## 2015-01-22 ENCOUNTER — Encounter (HOSPITAL_BASED_OUTPATIENT_CLINIC_OR_DEPARTMENT_OTHER): Payer: Medicare Other

## 2015-01-22 VITALS — BP 126/68 | HR 61 | Temp 98.2°F | Resp 18 | Wt 212.7 lb

## 2015-01-22 DIAGNOSIS — D0501 Lobular carcinoma in situ of right breast: Secondary | ICD-10-CM

## 2015-01-22 DIAGNOSIS — D0511 Intraductal carcinoma in situ of right breast: Secondary | ICD-10-CM | POA: Diagnosis not present

## 2015-01-22 DIAGNOSIS — C50411 Malignant neoplasm of upper-outer quadrant of right female breast: Secondary | ICD-10-CM

## 2015-01-22 LAB — COMPREHENSIVE METABOLIC PANEL
ALT: 21 U/L (ref 14–54)
AST: 30 U/L (ref 15–41)
Albumin: 4.2 g/dL (ref 3.5–5.0)
Alkaline Phosphatase: 66 U/L (ref 38–126)
Anion gap: 10 (ref 5–15)
BUN: 9 mg/dL (ref 6–20)
CO2: 29 mmol/L (ref 22–32)
Calcium: 9.2 mg/dL (ref 8.9–10.3)
Chloride: 102 mmol/L (ref 101–111)
Creatinine, Ser: 0.84 mg/dL (ref 0.44–1.00)
GFR calc Af Amer: >60 mL/min (ref >60)
GFR calc non Af Amer: >60 mL/min (ref >60)
Glucose, Bld: 81 mg/dL (ref 70–99)
Potassium: 3.2 mmol/L — ABNORMAL LOW (ref 3.5–5.1)
Sodium: 141 mmol/L (ref 135–145)
Total Bilirubin: 0.5 mg/dL (ref 0.3–1.2)
Total Protein: 7.9 g/dL (ref 6.5–8.1)

## 2015-01-22 LAB — CBC WITH DIFFERENTIAL/PLATELET
Basophils Absolute: 0 10*3/uL (ref 0.0–0.1)
Basophils Relative: 1 % (ref 0–1)
Eosinophils Absolute: 0.1 10*3/uL (ref 0.0–0.7)
Eosinophils Relative: 2 % (ref 0–5)
HCT: 41.9 % (ref 36.0–46.0)
Hemoglobin: 13.7 g/dL (ref 12.0–15.0)
Lymphocytes Relative: 49 % — ABNORMAL HIGH (ref 12–46)
Lymphs Abs: 3.1 10*3/uL (ref 0.7–4.0)
MCH: 30.2 pg (ref 26.0–34.0)
MCHC: 32.7 g/dL (ref 30.0–36.0)
MCV: 92.5 fL (ref 78.0–100.0)
Monocytes Absolute: 0.5 10*3/uL (ref 0.1–1.0)
Monocytes Relative: 9 % (ref 3–12)
Neutro Abs: 2.4 10*3/uL (ref 1.7–7.7)
Neutrophils Relative %: 39 % — ABNORMAL LOW (ref 43–77)
Platelets: 251 10*3/uL (ref 150–400)
RBC: 4.53 MIL/uL (ref 3.87–5.11)
RDW: 14.8 % (ref 11.5–15.5)
WBC: 6.2 10*3/uL (ref 4.0–10.5)

## 2015-01-22 NOTE — Patient Instructions (Signed)
[place] at [facility] Discharge Instructions  RECOMMENDATIONS MADE BY THE CONSULTANT AND ANY TEST RESULTS WILL BE SENT TO YOUR REFERRING PHYSICIAN.  Exam and discussion by Robynn Pane, PA-C Continue the tamoxifen. Report any new lumps, bone pain, shortness of breath or other symptoms. Call with any concerns.  Return in 3 months with labs and office visit.  Thank you for choosing [place] at [facility] to provide your oncology and hematology care.  To afford each patient quality time with our provider, please arrive at least 15 minutes before your scheduled appointment time.    You need to re-schedule your appointment should you arrive 10 or more minutes late.  We strive to give you quality time with our providers, and arriving late affects you and other patients whose appointments are after yours.  Also, if you no show three or more times for appointments you may be dismissed from the clinic at the providers discretion.     Again, thank you for choosing [facility].  Our hope is that these requests will decrease the amount of time that you wait before being seen by our physicians.       _____________________________________________________________  Should you have questions after your visit to [facility], please contact our office at [phone] between the hours of 8:30 a.m. and 4:30 p.m.  Voicemails left after 4:30 p.m. will not be returned until the following business day.  For prescription refill requests, have your pharmacy contact our office.

## 2015-01-22 NOTE — Progress Notes (Signed)
Lab draw

## 2015-01-24 DIAGNOSIS — C50919 Malignant neoplasm of unspecified site of unspecified female breast: Secondary | ICD-10-CM | POA: Diagnosis not present

## 2015-01-24 DIAGNOSIS — Z0000 Encounter for general adult medical examination without abnormal findings: Secondary | ICD-10-CM | POA: Diagnosis not present

## 2015-01-28 DIAGNOSIS — E785 Hyperlipidemia, unspecified: Secondary | ICD-10-CM | POA: Diagnosis not present

## 2015-01-28 DIAGNOSIS — E039 Hypothyroidism, unspecified: Secondary | ICD-10-CM | POA: Diagnosis not present

## 2015-01-29 ENCOUNTER — Other Ambulatory Visit (HOSPITAL_COMMUNITY): Payer: Self-pay | Admitting: Oncology

## 2015-01-29 DIAGNOSIS — C50411 Malignant neoplasm of upper-outer quadrant of right female breast: Secondary | ICD-10-CM

## 2015-01-29 MED ORDER — TAMOXIFEN CITRATE 20 MG PO TABS: 20.0000 mg | ORAL_TABLET | Freq: Every day | ORAL | Status: DC

## 2015-01-31 ENCOUNTER — Other Ambulatory Visit (HOSPITAL_COMMUNITY): Payer: Self-pay | Admitting: Oncology

## 2015-01-31 DIAGNOSIS — C50411 Malignant neoplasm of upper-outer quadrant of right female breast: Secondary | ICD-10-CM

## 2015-01-31 MED ORDER — TAMOXIFEN CITRATE 20 MG PO TABS: 20.0000 mg | ORAL_TABLET | Freq: Every day | ORAL | Status: DC

## 2015-02-06 DIAGNOSIS — C50919 Malignant neoplasm of unspecified site of unspecified female breast: Secondary | ICD-10-CM | POA: Diagnosis not present

## 2015-02-14 ENCOUNTER — Telehealth: Payer: Self-pay | Admitting: Orthopedic Surgery

## 2015-02-14 ENCOUNTER — Other Ambulatory Visit: Payer: Self-pay | Admitting: *Deleted

## 2015-02-14 MED ORDER — OXYCODONE-ACETAMINOPHEN 5-325 MG PO TABS: 1.0000 | ORAL_TABLET | Freq: Four times a day (QID) | ORAL | Status: AC | PRN

## 2015-02-14 NOTE — Telephone Encounter (Signed)
Patient called to request refill on pain medication, states Oxycodone.  Said she is travelling back home from vacation; therefore, was unable to call earlier to request it.  She also has scheduled appointment for the left knee, due to pain and swelling; appointment scheduled for first available, 02/25/15.  Patient's Cell ph# is [phone]

## 2015-02-19 NOTE — Telephone Encounter (Signed)
Patient picked up Rx

## 2015-02-19 NOTE — Telephone Encounter (Signed)
Rx is ready for pick up and Patient is aware

## 2015-02-25 ENCOUNTER — Ambulatory Visit (INDEPENDENT_AMBULATORY_CARE_PROVIDER_SITE_OTHER): Payer: Medicare Other | Admitting: Orthopedic Surgery

## 2015-02-25 ENCOUNTER — Ambulatory Visit (INDEPENDENT_AMBULATORY_CARE_PROVIDER_SITE_OTHER): Payer: Medicare Other

## 2015-02-25 VITALS — BP 109/60 | Ht 66.0 in | Wt 217.0 lb

## 2015-02-25 DIAGNOSIS — M25562 Pain in left knee: Secondary | ICD-10-CM | POA: Diagnosis not present

## 2015-02-25 NOTE — Patient Instructions (Addendum)
Make sure you are doing knee strengthening exercises   Probably scar tissue of fibrous bands hypertrophied then became inflamed, then released  Keep scheduled appointment in July

## 2015-02-25 NOTE — Progress Notes (Signed)
Patient ID: Sara [name], female   DOB: 21-Nov-1945, 69 y.o.   MRN: [id number] Chief Complaint  Patient presents with  . Follow-up    left knee pain and swelling, TKA 08/08/14    69 year old female now almost 6-7 months post left total knee. She went on a trip. She walked around a couple days. Had severe pain and swelling in her left knee. Put her in a wheelchair. She then finished a trip mainly in the wheelchair she can put pressure on her knee. She came home she stretched in bed something popped in the knee and since that time the pain and swelling have gone away. The only thing she's feels now is that sometimes she feels like the knee goes into hyperextension  System review no numbness or tingling no fever or chills  Exam shows full extension of the knee may be a little bit of hyperextension. Her flexion arc is 120. Her collateral ligaments are stable and the knee is stable anterior posterior there is no joint effusion she's neurovascularly intact she has no tenderness. Her extension is excellent  Ambulatory status is normal without assistive device she is awake alert and oriented 3 mood and affect are normal  X-rays today were ordered and I interpret those films as normal stable total knee  Recommend routine follow-up. And I encouraged her to do knee strengthening exercises for the quadriceps.

## 2015-03-18 ENCOUNTER — Other Ambulatory Visit: Payer: Self-pay

## 2015-04-03 ENCOUNTER — Other Ambulatory Visit: Payer: Self-pay | Admitting: Orthopedic Surgery

## 2015-04-08 ENCOUNTER — Other Ambulatory Visit: Payer: Self-pay | Admitting: *Deleted

## 2015-04-08 MED ORDER — GABAPENTIN 600 MG PO TABS: ORAL_TABLET | ORAL | Status: DC

## 2015-04-22 ENCOUNTER — Ambulatory Visit: Payer: Medicare Other | Admitting: Orthopedic Surgery

## 2015-04-24 ENCOUNTER — Encounter (HOSPITAL_BASED_OUTPATIENT_CLINIC_OR_DEPARTMENT_OTHER): Payer: Medicare Other

## 2015-04-24 ENCOUNTER — Encounter (HOSPITAL_COMMUNITY): Payer: Self-pay | Admitting: Hematology & Oncology

## 2015-04-24 ENCOUNTER — Encounter (HOSPITAL_COMMUNITY): Payer: Medicare Other | Attending: Hematology & Oncology | Admitting: Hematology & Oncology

## 2015-04-24 VITALS — BP 140/76 | HR 56 | Temp 98.1°F | Resp 18 | Wt 221.0 lb

## 2015-04-24 DIAGNOSIS — C50411 Malignant neoplasm of upper-outer quadrant of right female breast: Secondary | ICD-10-CM | POA: Diagnosis not present

## 2015-04-24 DIAGNOSIS — D0501 Lobular carcinoma in situ of right breast: Secondary | ICD-10-CM | POA: Diagnosis not present

## 2015-04-24 DIAGNOSIS — C50419 Malignant neoplasm of upper-outer quadrant of unspecified female breast: Secondary | ICD-10-CM | POA: Diagnosis not present

## 2015-04-24 DIAGNOSIS — D0511 Intraductal carcinoma in situ of right breast: Secondary | ICD-10-CM | POA: Diagnosis not present

## 2015-04-24 LAB — COMPREHENSIVE METABOLIC PANEL
ALT: 24 U/L (ref 14–54)
AST: 33 U/L (ref 15–41)
Albumin: 3.6 g/dL (ref 3.5–5.0)
Alkaline Phosphatase: 49 U/L (ref 38–126)
Anion gap: 9 (ref 5–15)
BUN: 11 mg/dL (ref 6–20)
CO2: 24 mmol/L (ref 22–32)
Calcium: 8.6 mg/dL — ABNORMAL LOW (ref 8.9–10.3)
Chloride: 105 mmol/L (ref 101–111)
Creatinine, Ser: 0.87 mg/dL (ref 0.44–1.00)
GFR calc Af Amer: >60 mL/min (ref >60)
GFR calc non Af Amer: >60 mL/min (ref >60)
Glucose, Bld: 115 mg/dL — ABNORMAL HIGH (ref 65–99)
Potassium: 3.5 mmol/L (ref 3.5–5.1)
Sodium: 138 mmol/L (ref 135–145)
Total Bilirubin: 0.5 mg/dL (ref 0.3–1.2)
Total Protein: 7 g/dL (ref 6.5–8.1)

## 2015-04-24 LAB — CBC WITH DIFFERENTIAL/PLATELET
Basophils Absolute: 0 10*3/uL (ref 0.0–0.1)
Basophils Relative: 1 % (ref 0–1)
Eosinophils Absolute: 0.1 10*3/uL (ref 0.0–0.7)
Eosinophils Relative: 2 % (ref 0–5)
HCT: 38.4 % (ref 36.0–46.0)
Hemoglobin: 12.9 g/dL (ref 12.0–15.0)
Lymphocytes Relative: 41 % (ref 12–46)
Lymphs Abs: 2.6 10*3/uL (ref 0.7–4.0)
MCH: 30.9 pg (ref 26.0–34.0)
MCHC: 33.6 g/dL (ref 30.0–36.0)
MCV: 91.9 fL (ref 78.0–100.0)
Monocytes Absolute: 0.4 10*3/uL (ref 0.1–1.0)
Monocytes Relative: 7 % (ref 3–12)
Neutro Abs: 3.2 10*3/uL (ref 1.7–7.7)
Neutrophils Relative %: 49 % (ref 43–77)
Platelets: 219 10*3/uL (ref 150–400)
RBC: 4.18 MIL/uL (ref 3.87–5.11)
RDW: 14.7 % (ref 11.5–15.5)
WBC: 6.4 10*3/uL (ref 4.0–10.5)

## 2015-04-24 MED ORDER — TAMOXIFEN CITRATE 20 MG PO TABS: 20.0000 mg | ORAL_TABLET | Freq: Every day | ORAL | Status: DC

## 2015-04-24 NOTE — Progress Notes (Signed)
[place] PROGRESS NOTE  Patient Care Team: [name], MD as PCP - General (Family Medicine) Daneil Dolin, MD (Gastroenterology) Josue Hector, MD as Attending Physician (Cardiology)  CHIEF COMPLAINTS/PURPOSE OF CONSULTATION:  DCIS R breast, DUCTAL CARCINOMA IN SITU, MIXED PATTERNS (CRIBRIFORM, COMEDO AND PAPILLARY), INVOLVING MULTIPLE CORES, AT LEAST 9 MM IN MAXIMAL EXTENT IN ANY ONE CORE.  Right Simple mastectomy with axillary sentinel node biopsy with reconstruction and placement of tissue expander 11/20/2014  TNM: pTis, pN0. Ultimate size of tumor, 10 cm. DCIS and LCIS. 7 sentinel nodes removed all negative for disease  Tamoxifen for chemoprevention started in 2016  Sara [name] is a 69 y.o. female who underwent  screening mammography. Asymmetry was reported in the right breast.. The left breast was negative. Ultrasound of this area showed a irrgeular mass in the 12 o'clock location measuring 0.7 x 0.5 x 0.4 cm. Biopsy was performed and showed DCIS with focal necrosis. ER and PR were positive.   A breast MRI showed an 8 x 12 x 8 cm area of abnormal linear clumped enhancement throughout the majority of the central and upper right breast.   She has undergone a simple mastectomy of the right breast and sentinel node procedure. She is also receiving reconstructive therapy.  The patient has a throbbing discomfort in her left breast. This is very distressing for her. She experiences pain when palpated during her physical exam.  She says that when she is laying down, she does not feel any lumps.   She denies chest pain and problems breathing.  She is still a little sore and swollen on her right side from surgery but overall says that her mood is great and she feels "beautiful"   Her appetite is well and she says that she has gained weight.  He does not take calcium and vitamin D daily. She takes tamoxifen daily for chemoprevention. She  denies hot flashes, vaginal bleeding.  MEDICAL HISTORY:  Past Medical History  Diagnosis Date  . Pure hypercholesterolemia     takes Pravastin daily  . Allergic rhinitis due to pollen   . Cervicalgia   . Diverticulosis of colon (without mention of hemorrhage)   . Morbid obesity   . Palpitations   . Peptic ulcer, unspecified site, unspecified as acute or chronic, without mention of hemorrhage, perforation, or obstruction   . Shortness of breath   . H/O hiatal hernia   . HTN (hypertension)     takes Metoprolol and Lisinopril daily  . Hypertension   . GERD (gastroesophageal reflux disease)     takes Omeprazole daily  . Constipation     takes Miralax daily  . Insomnia     takes Ativan nigtly  . Other postablative hypothyroidism     takes SYnthroid daily  . Joint pain     "all of them"  . Joint swelling     "knees, legs, ankles sometimes" (11/20/2014)  . Sleep apnea     slight but doesn't require a CPAP (11/20/2014)  . Graves' disease     "took a pill to correct"  . Headache(784.0)     denies migraines since the 80's but has occ and takes Butalbital prn  . Cervical spondylosis without myelopathy     all over  . Degeneration of cervical intervertebral disc   . Primary localized osteoarthrosis, lower leg   . Arthritis     "knees, back, right elbow; left shoulder; right ankle" (11/20/2014)  .  Chronic lower back pain   . Anxiety   . Depression   . Breast cancer, right breast     "DCIS; zero stage" S/P mastectomy 11/20/2014  . Tendonitis of knee     bilateral    SURGICAL HISTORY: Past Surgical History  Procedure Laterality Date  . Knee arthroscopy Right   . Appendectomy    . Colonoscopy  Sept 2008    RMR: normal rectum, left-sided diverticula, repeat in 2018  . Abdominal hysterectomy    . Upper gastrointestinal endoscopy    . Cataract extraction w/ intraocular lens  implant, bilateral Bilateral   . Lumpectomy on pelvis      "mass of nerves"  . Diagnostic laparoscopy    .  Tubal ligation    . Esophagogastroduodenoscopy    . Anterior lat lumbar fusion  05/13/2012    Procedure: ANTERIOR LATERAL LUMBAR FUSION 2 LEVELS;  Surgeon: Faythe Ghee, MD;  Location: [facility];  Service: Neurosurgery;  Laterality: Left;  Left Lumbar Three-four,Lumbar four-five Extreme Lumbar Interbody Fusion with Percutaneous Pedicle Screws  . Knee arthroscopy with lateral menisectomy Right 10/06/2013    Procedure: KNEE ARTHROSCOPY WITH LATERAL AND MEDIAL MENISECTOMY;  Surgeon: Carole Civil, MD;  Location: [facility];  Service: Orthopedics;  Laterality: Right;  END @ 1234  . Foreign body removal Right 10/06/2013    Procedure: REMOVAL FOREIGN BODY EXTREMITY;  Surgeon: Carole Civil, MD;  Location: [facility];  Service: Orthopedics;  Laterality: Right;  . Injection knee Left 10/06/2013    Procedure: KNEE INJECTION;  Surgeon: Carole Civil, MD;  Location: [facility];  Service: Orthopedics;  Laterality: Left;  . Total knee arthroplasty Right 01/24/2014    Procedure: TOTAL KNEE ARTHROPLASTY;  Surgeon: Carole Civil, MD;  Location: [facility];  Service: Orthopedics;  Laterality: Right;  . Total knee arthroplasty Left 08/08/2014    Procedure: TOTAL KNEE ARTHROPLASTY;  Surgeon: Carole Civil, MD;  Location: [facility];  Service: Orthopedics;  Laterality: Left;  Marland Kitchen Mastectomy complete / simple w/ sentinel node biopsy Right 11/20/2014    axillary  . Breast reconstruction with placement of tissue expander and flex hd (acellular hydrated dermis) Right 11/20/2014  . Joint replacement    . Tonsillectomy    . Dilation and curettage of uterus    . Breast lumpectomy Left 1988  . Breast biopsy Left ~ 2014  . Breast biopsy Right 2015  . Simple mastectomy with axillary sentinel node biopsy Right 11/20/2014    Procedure: SIMPLE MASTECTOMY WITH AXILLARY SENTINEL NODE BIOPSY;  Surgeon: Erroll Luna, MD;  Location: [place] [facility];  Service: General;  Laterality: Right;  . Breast reconstruction with placement of tissue  expander and flex hd (acellular hydrated dermis) Right 11/20/2014    Procedure: RIGHT BREAST RECONSTRUCTION WITH PLACEMENT OF TISSUE EXPANDER AND ACELLULAR DERMA MATRIX ([place]);  Surgeon: Crissie Reese, MD;  Location: [place];  Service: Plastics;  Laterality: Right;  . Tissue expander removal Right 12/13/2014    dr Harlow Mares  . Removal of tissue expander and placement of implant Right 12/13/2014    Procedure: REMOVAL OF RIGHT BREAST TISSUE EXPANDER;  Surgeon: Crissie Reese, MD;  Location: [place];  Service: Plastics;  Laterality: Right;    SOCIAL HISTORY: Social History   Social History  . Marital Status: Married    Spouse Name: N/A  . Number of Children: N/A  . Years of Education: N/A   Occupational History  . disability     since 2008, knee  surgery   Social History Main Topics  . Smoking status: Former Smoker -- 1.00 packs/day for 27 years    Types: Cigarettes    Quit date: 12/26/1985  . Smokeless tobacco: Never Used  . Alcohol Use: 2.4 oz/week    4 Glasses of wine, 0 Standard drinks or equivalent per week     Comment: occasional wine  . Drug Use: No  . Sexual Activity: Yes    Birth Control/ Protection: Surgical   Other Topics Concern  . Not on file   Social History Narrative   Currently married for 10 years. Husband's name is Milbert Coulter. She is an excellent singer. 8 children between she and her husband. 23 grand children. 12 great grandchildren.    FAMILY HISTORY: Family History  Problem Relation Age of Onset  . Colon cancer Neg Hx     Mother died at 23 from blood clot, MI  . Heart attack      Father died in 80's from old age  . Heart attack      2 Brothers, 3 sisters  . Stroke Sister   . Osteoarthritis Sister   . Osteoarthritis Brother   . Obesity Brother   . Heart attack Sister    has no family status information on file.   ALLERGIES:  is allergic to propoxyphene hcl; pollen extract; camphor; dust mite extract; latex; molds & smuts; and  tomato.  MEDICATIONS:  Current Outpatient Prescriptions  Medication Sig Dispense Refill  . acetaminophen (TYLENOL) 500 MG tablet Take 1,000 mg by mouth every 6 (six) hours as needed for moderate pain.     . diphenhydrAMINE (SOMINEX) 25 MG tablet Take 50 mg by mouth at bedtime as needed for allergies or sleep.    . fluticasone (FLONASE) 50 MCG/ACT nasal spray Place 2 sprays into the nose at bedtime.     . gabapentin (NEURONTIN) 600 MG tablet 1/2 tab during the day and one tab at bedtime 180 tablet 3  . HYDROcodone-acetaminophen (NORCO/VICODIN) 5-325 MG per tablet Take 1 tablet by mouth every 6 (six) hours as needed for moderate pain. 90 tablet 0  . levothyroxine (SYNTHROID, LEVOTHROID) 88 MCG tablet Take 88 mcg by mouth daily before breakfast.     . Lidocaine & Menthol-Methyl Sal 5 & 3-10 % THPK Apply topically.    Marland Kitchen LORazepam (ATIVAN) 1 MG tablet Take 1 mg by mouth at bedtime as needed for sleep. For sleep    . losartan-hydrochlorothiazide (HYZAAR) 100-12.5 MG per tablet Take 1 tablet by mouth daily.     . methocarbamol (ROBAXIN) 500 MG tablet Take 1 tablet (500 mg total) by mouth 4 (four) times daily. (Patient not taking: Reported on 01/22/2015) 30 tablet 0  . metoprolol tartrate (LOPRESSOR) 25 MG tablet Take 25 mg by mouth 2 (two) times daily.     Marland Kitchen omeprazole (PRILOSEC) 20 MG capsule Take 20 mg by mouth daily.    Marland Kitchen omeprazole (PRILOSEC) 40 MG capsule Take 40 mg by mouth daily before breakfast.     . polyethylene glycol (MIRALAX / GLYCOLAX) packet Take 17 g by mouth daily. 14 each 0  . pravastatin (PRAVACHOL) 40 MG tablet Take 40 mg by mouth daily with breakfast.     . PRESCRIPTION MEDICATION Apply 1 application topically as needed. Mixture made by [facility] that contains lidocaine, gabapentin, diclofenac this is compounded into a cream that is applied to knees    . tamoxifen (NOLVADEX) 20 MG tablet Take 1 tablet (20 mg total) by mouth daily.  90 tablet 2  . [DISCONTINUED] Ranitidine  HCl (ZANTAC 75 PO) Take by mouth.       No current facility-administered medications for this visit.    Review of Systems  Constitutional: Negative for fever and chills. Negative for weight loss and malaise/fatigue.  HENT: Negative for congestion, hearing loss, nosebleeds, sore throat and tinnitus.   Eyes: Negative for blurred vision, double vision, pain and discharge.  Respiratory: Negative for cough, hemoptysis, sputum production, shortness of breath and wheezing.   Cardiovascular: Negative for chest pain, palpitations, claudication, leg swelling and PND.  Gastrointestinal: Negative for heartburn, nausea, vomiting, abdominal pain, diarrhea, constipation, blood in stool and melena.  Genitourinary: Negative for dysuria, urgency, frequency and hematuria.  Musculoskeletal: Negative for myalgias, joint pain and falls.  Skin: Negative for itching and rash.  Neurological: Negative for dizziness, tingling, tremors, sensory change, speech change, focal weakness, seizures, loss of consciousness, weakness and headaches.  Endo/Heme/Allergies: Does not bruise/bleed easily.  Psychiatric/Behavioral: Negative for depression, suicidal ideas, memory loss and substance abuse. The patient is not nervous/anxious and does not have insomnia.   See HPI 14 point review of systems was performed and is negative except as detailed under history of present illness and above  PHYSICAL EXAMINATION: ECOG PERFORMANCE STATUS: 0 - Asymptomatic  Filed Vitals:   04/24/15 1214  BP: 140/76  Pulse: 56  Temp: 98.1 F (36.7 C)  Resp: 18   Filed Weights   04/24/15 1214  Weight: 221 lb (100.245 kg)     Physical Exam  Constitutional: She is oriented to person, place, and time and well-developed, well-nourished, and in no distress.  HENT:  Head: Normocephalic and atraumatic.  Nose: Nose normal.  Mouth/Throat: Oropharynx is clear and moist. No oropharyngeal exudate.  Eyes: Conjunctivae and EOM are normal. Pupils are  equal, round, and reactive to light. Right eye exhibits no discharge. Left eye exhibits no discharge. No scleral icterus.  Neck: Normal range of motion. Neck supple. No tracheal deviation present. No thyromegaly present.  Cardiovascular: Normal rate, regular rhythm and normal heart sounds.  Exam reveals no gallop and no friction rub.   No murmur heard. Pulmonary/Chest: Effort normal and breath sounds normal. She has no wheezes. She has no rales.   breast; left breast is examined without palpable abnormality. No skin changes no nipple retraction. No axillary adenopathy. Patient complains of discomfort with palpation of the breast.  Abdominal: Soft. Bowel sounds are normal. She exhibits no distension and no mass. There is no tenderness. There is no rebound and no guarding.  Musculoskeletal: Normal range of motion. She exhibits no edema.  Lymphadenopathy:    She has no cervical adenopathy.  Neurological: She is alert and oriented to person, place, and time. She has normal reflexes. No cranial nerve deficit. Gait normal. Coordination normal.  Skin: Skin is warm and dry. No rash noted.  Psychiatric: Mood, memory, affect and judgment normal.  Nursing note and vitals reviewed.    LABORATORY DATA:  I have reviewed the data as listed Lab Results  Component Value Date   WBC 6.4 04/24/2015   HGB 12.9 04/24/2015   HCT 38.4 04/24/2015   MCV 91.9 04/24/2015   PLT 219 04/24/2015     Chemistry      Component Value Date/Time   NA 138 04/24/2015 1043   NA 142 08/19/2011 0826   K 3.5 04/24/2015 1043   K 4.2 08/19/2011 0826   CL 105 04/24/2015 1043   CL 101 08/19/2011 0826   CO2 24  04/24/2015 1043   BUN 11 04/24/2015 1043   CREATININE 0.87 04/24/2015 1043      Component Value Date/Time   CALCIUM 8.6* 04/24/2015 1043   CALCIUM 9.4 08/19/2011 0826   ALKPHOS 49 04/24/2015 1043   ALKPHOS 98 08/19/2011 0826   AST 33 04/24/2015 1043   AST 30 08/19/2011 0826   ALT 24 04/24/2015 1043   BILITOT  0.5 04/24/2015 1043   BILITOT 0.5 05/13/2011 0831      ASSESSMENT & PLAN:   DCIS/LCIS of the right breast  69 year old female who has undergone simple mastectomy and sentinel node procedure for extensive DCIS/LCIS of the right breast. All sentinel nodes were negative for disease. She is currently pursuing reconstruction.   She is on chemoprevention with tamoxifen. She wishes to continue. She is tolerating it well. We have called in a refill to [facility].  I tried to reassure her that at times tamoxifen can cause breast tenderness however she is very concerned about the left breast and we will therefore order a diagnostic mammogram.  I will see her back in 4 months.  All questions were answered. The patient knows to call the clinic with any problems, questions or concerns.   This document serves as a record of services personally performed by Ancil Linsey, MD. It was created on her behalf by Janace Hoard, a trained medical scribe. The creation of this record is based on the scribe's personal observations and the provider's statements to them. This document has been checked and approved by the attending provider.  I have reviewed the above documentation for accuracy and completeness, and I agree with the above.  This note was electronically signed.  Kelby Fam. Whitney Muse, MD

## 2015-04-24 NOTE — Progress Notes (Signed)
Labs drawn

## 2015-04-24 NOTE — Patient Instructions (Signed)
[place] at [facility] Discharge Instructions  RECOMMENDATIONS MADE BY THE CONSULTANT AND ANY TEST RESULTS WILL BE SENT TO YOUR REFERRING PHYSICIAN.  Mammogram as scheduled Office visit in 4 months. Start taking over the counter Calcium (1,200 mg) per day, and Vitamin D (1000 IU) per day.  Thank you for choosing [place] at [facility] to provide your oncology and hematology care.  To afford each patient quality time with our provider, please arrive at least 15 minutes before your scheduled appointment time.    You need to re-schedule your appointment should you arrive 10 or more minutes late.  We strive to give you quality time with our providers, and arriving late affects you and other patients whose appointments are after yours.  Also, if you no show three or more times for appointments you may be dismissed from the clinic at the providers discretion.     Again, thank you for choosing [facility]  Our hope is that these requests will decrease the amount of time that you wait before being seen by our physicians.       _____________________________________________________________  Should you have questions after your visit to [facility], please contact our office at [phone] between the hours of 8:30 a.m. and 4:30 p.m.  Voicemails left after 4:30 p.m. will not be returned until the following business day.  For prescription refill requests, have your pharmacy contact our office.

## 2015-04-29 ENCOUNTER — Encounter: Payer: Self-pay | Admitting: Orthopedic Surgery

## 2015-04-29 ENCOUNTER — Ambulatory Visit (INDEPENDENT_AMBULATORY_CARE_PROVIDER_SITE_OTHER): Payer: Medicare Other | Admitting: Orthopedic Surgery

## 2015-04-29 VITALS — BP 110/57 | Ht 66.0 in | Wt 221.0 lb

## 2015-04-29 DIAGNOSIS — Z96652 Presence of left artificial knee joint: Secondary | ICD-10-CM

## 2015-04-29 DIAGNOSIS — Z96651 Presence of right artificial knee joint: Secondary | ICD-10-CM

## 2015-04-29 MED ORDER — HYDROCODONE-ACETAMINOPHEN 5-325 MG PO TABS
1.0000 | ORAL_TABLET | Freq: Four times a day (QID) | ORAL | Status: DC | PRN
Start: 2015-04-29 — End: 2015-08-28

## 2015-04-29 NOTE — Progress Notes (Signed)
Patient ID: Sara [name], female   DOB: 04-Oct-1945, 69 y.o.   MRN: [id number]  Follow up visit  Chief Complaint  Patient presents with  . Follow-up    3 month follow up bilateral TKA, DOS LEFT 08/08/14, RIGHT 01/24/14    BP 110/57 mmHg  Ht 5\' 6"  (1.676 m)  Wt 221 lb (100.245 kg)  BMI 35.69 kg/m2  Encounter Diagnoses  Name Primary?  . Status post total left knee replacement Yes  . Status post total right knee replacement     Routine follow-up the patient is doing much better she's having some bilateral aching knee pain on and off especially with weather related changes. She has tried some extra strength Tylenol 650 every 6 to every 8 without much relief she is otherwise doing well  Review of systems no other complaints at this time  Both knees looked great we x-rayed the left knee last time found no problems she has had some intermittent has anserine bursitis  We will continue her on hydrocodone 5 mg with appropriate warnings. Follow-up for right knee x-ray in May

## 2015-04-29 NOTE — Patient Instructions (Signed)
Please be advised. You are on a medication which is classified as an "opiod". The CDC has recently advised all providers to advise patient's that these medications have certain risks which include but are not limited to:    drug intolerance  drug addiction  respiratory depression   respiratory failure  Death  Please keep these medications locked away. If you feel that you are becoming addicted to these medicines or you are having difficulties with these medications please alert your provider.

## 2015-04-30 ENCOUNTER — Ambulatory Visit (HOSPITAL_COMMUNITY)
Admission: RE | Admit: 2015-04-30 | Discharge: 2015-04-30 | Disposition: A | Discharge status: Home / Self Care | Payer: Medicare Other | Source: Ambulatory Visit | Attending: Hematology & Oncology | Admitting: Hematology & Oncology

## 2015-04-30 DIAGNOSIS — C50419 Malignant neoplasm of upper-outer quadrant of unspecified female breast: Secondary | ICD-10-CM

## 2015-04-30 DIAGNOSIS — Z853 Personal history of malignant neoplasm of breast: Secondary | ICD-10-CM | POA: Diagnosis not present

## 2015-04-30 DIAGNOSIS — Z9011 Acquired absence of right breast and nipple: Secondary | ICD-10-CM | POA: Insufficient documentation

## 2015-04-30 DIAGNOSIS — N644 Mastodynia: Secondary | ICD-10-CM | POA: Insufficient documentation

## 2015-05-17 ENCOUNTER — Other Ambulatory Visit (HOSPITAL_COMMUNITY): Payer: Self-pay | Admitting: Hematology & Oncology

## 2015-05-17 DIAGNOSIS — Z1239 Encounter for other screening for malignant neoplasm of breast: Secondary | ICD-10-CM

## 2015-05-28 DIAGNOSIS — H524 Presbyopia: Secondary | ICD-10-CM | POA: Diagnosis not present

## 2015-05-28 DIAGNOSIS — H52221 Regular astigmatism, right eye: Secondary | ICD-10-CM | POA: Diagnosis not present

## 2015-05-28 DIAGNOSIS — H5213 Myopia, bilateral: Secondary | ICD-10-CM | POA: Diagnosis not present

## 2015-06-01 ENCOUNTER — Encounter (HOSPITAL_COMMUNITY): Payer: Self-pay | Admitting: Hematology & Oncology

## 2015-06-05 ENCOUNTER — Ambulatory Visit (HOSPITAL_COMMUNITY): Payer: Medicare Other | Attending: Plastic Surgery | Admitting: Occupational Therapy

## 2015-06-05 ENCOUNTER — Telehealth (HOSPITAL_COMMUNITY): Payer: Self-pay | Admitting: Occupational Therapy

## 2015-06-05 ENCOUNTER — Encounter (HOSPITAL_COMMUNITY): Payer: Self-pay | Admitting: Occupational Therapy

## 2015-06-05 DIAGNOSIS — M6281 Muscle weakness (generalized): Secondary | ICD-10-CM | POA: Diagnosis not present

## 2015-06-05 DIAGNOSIS — M2560 Stiffness of unspecified joint, not elsewhere classified: Secondary | ICD-10-CM | POA: Diagnosis not present

## 2015-06-05 DIAGNOSIS — M629 Disorder of muscle, unspecified: Secondary | ICD-10-CM | POA: Diagnosis not present

## 2015-06-05 DIAGNOSIS — M25511 Pain in right shoulder: Secondary | ICD-10-CM | POA: Insufficient documentation

## 2015-06-05 DIAGNOSIS — M25611 Stiffness of right shoulder, not elsewhere classified: Secondary | ICD-10-CM | POA: Diagnosis not present

## 2015-06-05 DIAGNOSIS — M6289 Other specified disorders of muscle: Secondary | ICD-10-CM

## 2015-06-05 NOTE — Patient Instructions (Signed)
SHOULDER: Flexion On Table   Place hands on table, elbows straight. Move hips away from body. Press hands down into table.  _10-15__ reps per set, _1__ sets per day  Abduction (Passive)   With arm out to side, resting on table, lower head toward arm, keeping trunk away from table.  Repeat _10-15___ times. Do _1___ sessions per day.  Copyright  VHI. All rights reserved.     Internal Rotation (Assistive)   Seated with elbow bent at right angle and held against side, slide arm on table surface in an inward arc. Repeat __10-15__ times. Do _1___ sessions per day. Activity: Use this motion to brush crumbs off the table.  Copyright  VHI. All rights reserved.

## 2015-06-05 NOTE — Therapy (Signed)
[place], [place], [address] Phone: [phone]   Fax:  [phone]  Occupational Therapy Evaluation  Patient Details  Name: Sara [name] MRN: [id number] Date of Birth: May 04, 1946 Referring Provider:  Crissie Reese, MD  Encounter Date: 06/05/2015      OT End of Session - 06/05/15 1159    Visit Number 1   Number of Visits 8   Date for OT Re-Evaluation 08/04/15  Mini reassessment 07/03/2015   Authorization Type [employer]   Authorization Time Period Before 10th visit   Authorization - Visit Number 1   Authorization - Number of Visits 10   OT Start Time 1015   OT Stop Time 1100   OT Time Calculation (min) 45 min   Activity Tolerance Patient tolerated treatment well   Behavior During Therapy [facility] for tasks assessed/performed      Past Medical History  Diagnosis Date  . Pure hypercholesterolemia     takes Pravastin daily  . Allergic rhinitis due to pollen   . Cervicalgia   . Diverticulosis of colon (without mention of hemorrhage)   . Morbid obesity   . Palpitations   . Peptic ulcer, unspecified site, unspecified as acute or chronic, without mention of hemorrhage, perforation, or obstruction   . Shortness of breath   . H/O hiatal hernia   . HTN (hypertension)     takes Metoprolol and Lisinopril daily  . Hypertension   . GERD (gastroesophageal reflux disease)     takes Omeprazole daily  . Constipation     takes Miralax daily  . Insomnia     takes Ativan nigtly  . Other postablative hypothyroidism     takes SYnthroid daily  . Joint pain     "all of them"  . Joint swelling     "knees, legs, ankles sometimes" (11/20/2014)  . Sleep apnea     slight but doesn't require a CPAP (11/20/2014)  . Graves' disease     "took a pill to correct"  . Headache(784.0)     denies migraines since the 80's but has occ and takes Butalbital prn  . Cervical spondylosis without myelopathy     all over  .  Degeneration of cervical intervertebral disc   . Primary localized osteoarthrosis, lower leg   . Arthritis     "knees, back, right elbow; left shoulder; right ankle" (11/20/2014)  . Chronic lower back pain   . Anxiety   . Depression   . Breast cancer, right breast     "DCIS; zero stage" S/P mastectomy 11/20/2014  . Tendonitis of knee     bilateral    Past Surgical History  Procedure Laterality Date  . Knee arthroscopy Right   . Appendectomy    . Colonoscopy  Sept 2008    RMR: normal rectum, left-sided diverticula, repeat in 2018  . Abdominal hysterectomy    . Upper gastrointestinal endoscopy    . Cataract extraction w/ intraocular lens  implant, bilateral Bilateral   . Lumpectomy on pelvis      "mass of nerves"  . Diagnostic laparoscopy    . Tubal ligation    . Esophagogastroduodenoscopy    . Anterior lat lumbar fusion  05/13/2012    Procedure: ANTERIOR LATERAL LUMBAR FUSION 2 LEVELS;  Surgeon: Faythe Ghee, MD;  Location: [facility];  Service: Neurosurgery;  Laterality: Left;  Left Lumbar Three-four,Lumbar four-five Extreme Lumbar Interbody Fusion with Percutaneous Pedicle Screws  . Knee arthroscopy with lateral  menisectomy Right 10/06/2013    Procedure: KNEE ARTHROSCOPY WITH LATERAL AND MEDIAL MENISECTOMY;  Surgeon: Carole Civil, MD;  Location: [facility];  Service: Orthopedics;  Laterality: Right;  END @ 1234  . Foreign body removal Right 10/06/2013    Procedure: REMOVAL FOREIGN BODY EXTREMITY;  Surgeon: Carole Civil, MD;  Location: [facility];  Service: Orthopedics;  Laterality: Right;  . Injection knee Left 10/06/2013    Procedure: KNEE INJECTION;  Surgeon: Carole Civil, MD;  Location: [facility];  Service: Orthopedics;  Laterality: Left;  . Total knee arthroplasty Right 01/24/2014    Procedure: TOTAL KNEE ARTHROPLASTY;  Surgeon: Carole Civil, MD;  Location: [facility];  Service: Orthopedics;  Laterality: Right;  . Total knee arthroplasty Left 08/08/2014    Procedure:  TOTAL KNEE ARTHROPLASTY;  Surgeon: Carole Civil, MD;  Location: [facility];  Service: Orthopedics;  Laterality: Left;  Marland Kitchen Mastectomy complete / simple w/ sentinel node biopsy Right 11/20/2014    axillary  . Breast reconstruction with placement of tissue expander and flex hd (acellular hydrated dermis) Right 11/20/2014  . Joint replacement    . Tonsillectomy    . Dilation and curettage of uterus    . Breast lumpectomy Left 1988  . Breast biopsy Left ~ 2014  . Breast biopsy Right 2015  . Simple mastectomy with axillary sentinel node biopsy Right 11/20/2014    Procedure: SIMPLE MASTECTOMY WITH AXILLARY SENTINEL NODE BIOPSY;  Surgeon: Erroll Luna, MD;  Location: [facility];  Service: General;  Laterality: Right;  . Breast reconstruction with placement of tissue expander and flex hd (acellular hydrated dermis) Right 11/20/2014    Procedure: RIGHT BREAST RECONSTRUCTION WITH PLACEMENT OF TISSUE EXPANDER AND ACELLULAR DERMA MATRIX (Thermal);  Surgeon: Crissie Reese, MD;  Location: [facility];  Service: Plastics;  Laterality: Right;  . Tissue expander removal Right 12/13/2014    dr Harlow Mares  . Removal of tissue expander and placement of implant Right 12/13/2014    Procedure: REMOVAL OF RIGHT BREAST TISSUE EXPANDER;  Surgeon: Crissie Reese, MD;  Location: [facility] [place];  Service: Plastics;  Laterality: Right;    There were no vitals filed for this visit.  Visit Diagnosis:  Limited joint range of motion (ROM)  Tight fascia  Muscle right arm weakness  Shoulder joint stiffness, right  Pain in right shoulder      Subjective Assessment - 06/05/15 1156    Subjective  S: I noticed my arm couldn't reach the lightswitch.    Pertinent History Pt is a 69 y/o female presenting with limited range of motion in right arm post mastectomy (March 2016). Pt first noticed limited range of motion in June. Pt reports she has discomfort at rest, and experiences increased pain when she moves her arm certain directions,  particularly in abduction. Dr. Harlow Mares has referred pt to occupational therapy for evaluation and treatment.    Special Tests FOTO Score: 44/100 (56% impairment)   Patient Stated Goals To get my range back.    Currently in Pain? No/denies           [facility] OT Assessment - 06/05/15 1010    Assessment   Diagnosis Limited R shoulder ROM  post mastectomy    Onset Date 02/20/15   Prior Therapy None   Precautions   Precautions None   Balance Screen   Has the patient fallen in the past 6 months No   Has the patient had a decrease in activity level because of a fear of  falling?  No   Is the patient reluctant to leave their home because of a fear of falling?  No   Home  Environment   Family/patient expects to be discharged to: Private residence   Living Arrangements Spouse/significant other   Available Help at Discharge Family   Type of [place] - single point;Grab bars - toilet   Lives With Spouse   Prior Function   Level of Independence Independent with basic ADLs   Leisure walking, sewing   ADL   ADL comments Pt has difficulty reaching into high cabinets, using light switches, reaching behind her, dressing tasks, bathing tasks.    Written Expression   Dominant Hand Right   Vision - History   Baseline Vision Wears glasses all the time   Cognition   Overall Cognitive Status Within Functional Limits for tasks assessed   ROM / Strength   AROM / PROM / Strength Strength;PROM;AROM   Palpation   Palpation comment Mod fascial restrictions in right upper arm, deltoid, trapezius, and scapularis regions   AROM   Overall AROM Comments Assessed in supine, ER/IR adducted   AROM Assessment Site Shoulder   Right/Left Shoulder Right   Right Shoulder Flexion 99 Degrees   Right Shoulder ABduction 55 Degrees   Right Shoulder Internal Rotation 90 Degrees   Right Shoulder External Rotation 25 Degrees   PROM   Overall PROM Comments Assessed in supine, ER/IR adducted    PROM Assessment Site Shoulder   Right/Left Shoulder Right   Right Shoulder Flexion 108 Degrees   Right Shoulder ABduction 70 Degrees   Right Shoulder Internal Rotation 90 Degrees   Right Shoulder External Rotation 30 Degrees   Strength   Overall Strength Comments Assessed in sitting, ER/IR adducted   Strength Assessment Site Shoulder   Right/Left Shoulder Right   Right Shoulder Flexion 3+/5   Right Shoulder ABduction 3-/5   Right Shoulder Internal Rotation 3+/5   Right Shoulder External Rotation 4-/5                         OT Education - 06/05/15 1159    Education provided Yes   Education Details table slides   Person(s) Educated Patient   Methods Explanation;Demonstration;Handout   Comprehension Verbalized understanding;Returned demonstration          OT Short Term Goals - 06/05/15 1205    OT SHORT TERM GOAL #1   Title Pt will be independent in HEP.    Time 4   Period Weeks   Status New   OT SHORT TERM GOAL #2   Title Pt will decrease pain to 4/10 during daily tasks.    Time 4   Period Weeks   Status New   OT SHORT TERM GOAL #3   Title Pt will decrease fascial restrictions from mod to min amounts.    Time 4   Period Weeks   Status New   OT SHORT TERM GOAL #4   Title Pt will increase AROM to [facility] to increase ability to perform dressing tasks.    Time 4   Period Weeks   Status New   OT SHORT TERM GOAL #5   Title Pt will increase strength to 4/5 to increase ability to complete overhead tasks.    Time 4   Period Weeks   Status New           OT Long Term Goals - 06/05/15 1207  OT LONG TERM GOAL #1   Title Pt will return to prior level of functioning and independence in daily tasks.    Time 8   Period Weeks   Status New   OT LONG TERM GOAL #2   Title Pt will decrease pain to 2/10 or less during daily tasks.    Time 8   Period Weeks   Status New   OT LONG TERM GOAL #3   Title Pt will decrease fascial restrictions to trace amounts or  less.    Time 8   Period Weeks   Status New   OT LONG TERM GOAL #4   Title Pt will increase RUE AROM to WNL to increase ability to reach into overhead cabinets.    Time 8   Period Weeks   Status New   OT LONG TERM GOAL #5   Title Pt will increase strength to 4+/5 to increase ability to lift lightweight objects into cabinets.    Time 8   Period Weeks   Status New               Plan - 01-Jul-2015 1200    Clinical Impression Statement A: Pt is a 69 y/o female presenting with limited range of motion in RUE post mastectomy in March 2016. Pt is experincing increased pain and fascial restrictions, decreased range of motion and strength, limiting her ability to complete daily activities. Pt also reports numbness in right upper arm and scapularis region, present since mastectomy in March.  Pt was provided with table slides for HEP, as she is unable to complete shoulder stretches at this time. Pt is only able to come one time per week due to high deductible.    Pt will benefit from skilled therapeutic intervention in order to improve on the following deficits (Retired) Decreased strength;Pain;Impaired sensation;Impaired UE functional use;Decreased range of motion;Increased fascial restricitons;Impaired flexibility   Rehab Potential Good   OT Frequency 1x / week   OT Duration 8 weeks   OT Treatment/Interventions Self-care/ADL training;Passive range of motion;Patient/family education;Cryotherapy;Electrical Stimulation;Therapeutic exercises;Manual Therapy;Moist Heat;Therapeutic activities   Plan P: Pt would benefit from skilled occupational therapy services to decrease pain and fascial restrictions, increase range of motion and strength, enabling her to complete daily tasks at highest level of functioning. Treatment Plan: Myofascial release, manual stretching, PROM, AAROM, AROM, proximal shoulder strengthening, and general RUE strengthening.    OT Home Exercise Plan table slides   Consulted and  Agree with Plan of Care Patient          G-Codes - Jul 01, 2015 01/15/1210    Functional Assessment Tool Used FOTO Score: 44/100 (56% impairment)   Functional Limitation Carrying, moving and handling objects   Carrying, Moving and Handling Objects Current Status (H3716) At least 40 percent but less than 60 percent impaired, limited or restricted   Carrying, Moving and Handling Objects Goal Status (R6789) At least 1 percent but less than 20 percent impaired, limited or restricted      Problem List Patient Active Problem List   Diagnosis Date Noted  . Breast cancer 11/20/2014  . Breast cancer, right breast 11/20/2014  . Malignant neoplasm of upper outer quadrant of female breast 10/11/2014  . S/P total knee replacement 08/08/2014  . Difficulty in walking(719.7) 02/15/2014  . Postoperative stiffness of total knee replacement 02/15/2014  . Pain in joint, lower leg 02/15/2014  . Abnormality of gait 10/11/2013  . S/P arthroscopy of right knee 10/09/2013  . Radicular pain of right lower extremity  07/25/2013  . Knee instability 07/25/2013  . Right knee pain 07/25/2013  . Chest pain 04/25/2013  . Patellar tendonitis 09/07/2012  . OA (osteoarthritis) of knee 02/09/2012  . GERD (gastroesophageal reflux disease) 12/01/2011  . Dysphagia 12/01/2011  . RUQ pain 12/01/2011  . Fatty liver 12/01/2011  . Hematochezia 12/01/2011  . INTERMITTENT VERTIGO 09/12/2010  . KNEE, ARTHRITIS, DEGEN./OSTEO 04/03/2009  . CHEST PAIN, RECURRENT 03/07/2009  . OTHER DYSPHAGIA 03/07/2009  . PALPITATIONS 02/06/2009  . LIVER FUNCTION TESTS, ABNORMAL, HX OF 01/01/2009  . MORBID OBESITY 12/31/2008  . PUD 12/31/2008  . BACK PAIN 12/24/2008  . ANSERINE BURSITIS 12/24/2008  . BENIGN POSITIONAL VERTIGO 07/16/2008  . H N P-LUMBAR 04/02/2008  . SPINAL STENOSIS, CERVICAL 02/15/2008  . SPINAL STENOSIS, LUMBAR 02/15/2008  . Patellar tendinitis 10/19/2007  . TEAR LATERAL MENISCUS 06/13/2007  . OBESITY NOS 10/29/2006  .  ALLERGIC RHINITIS, SEASONAL 10/29/2006  . HYPOTHYROIDISM 09/07/2006  . HYPERLIPIDEMIA 09/07/2006  . HYPERTENSION 09/07/2006  . GERD 09/07/2006  . DIVERTICULOSIS, COLON 09/07/2006  . OVERACTIVE BLADDER 09/07/2006  . FIBROCYSTIC BREAST DISEASE 09/07/2006  . OSTEOARTHRITIS 09/07/2006    Guadelupe Sabin, OTR/L  [phone]  06/05/2015, 12:13 PM  [place] [address] [place], [place], [address] Phone: [phone]   Fax:  [phone]

## 2015-06-05 NOTE — Telephone Encounter (Signed)
L/M about update on benfits 06/05/15 NF [employer] MCR [employer] No auth HMO Dual Complete No Deduct Copay 40.00 OOP [id number] SPoke to [facility] [id number] updated due to pt request for more information. 06/05/15

## 2015-06-05 NOTE — Telephone Encounter (Signed)
Patient's husband called and said to cx all future apptment b/c  they can not afford another bill right now. Wife will do exercises at home.

## 2015-06-12 ENCOUNTER — Encounter (HOSPITAL_COMMUNITY): Payer: Medicare Other | Admitting: Occupational Therapy

## 2015-06-18 DIAGNOSIS — E039 Hypothyroidism, unspecified: Secondary | ICD-10-CM | POA: Diagnosis not present

## 2015-06-18 DIAGNOSIS — N3281 Overactive bladder: Secondary | ICD-10-CM | POA: Diagnosis not present

## 2015-06-18 DIAGNOSIS — R109 Unspecified abdominal pain: Secondary | ICD-10-CM | POA: Diagnosis not present

## 2015-06-18 DIAGNOSIS — Z23 Encounter for immunization: Secondary | ICD-10-CM | POA: Diagnosis not present

## 2015-06-18 DIAGNOSIS — I10 Essential (primary) hypertension: Secondary | ICD-10-CM | POA: Diagnosis not present

## 2015-06-18 DIAGNOSIS — R000 Tachycardia, unspecified: Secondary | ICD-10-CM | POA: Diagnosis not present

## 2015-06-19 ENCOUNTER — Encounter (HOSPITAL_COMMUNITY): Payer: Medicare Other | Admitting: Specialist

## 2015-06-26 ENCOUNTER — Encounter (HOSPITAL_COMMUNITY): Payer: Medicare Other | Admitting: Occupational Therapy

## 2015-07-03 ENCOUNTER — Other Ambulatory Visit (HOSPITAL_COMMUNITY): Payer: Self-pay | Admitting: Family Medicine

## 2015-07-03 ENCOUNTER — Encounter (HOSPITAL_COMMUNITY): Payer: Medicare Other | Admitting: Occupational Therapy

## 2015-07-03 DIAGNOSIS — R1084 Generalized abdominal pain: Secondary | ICD-10-CM

## 2015-07-05 ENCOUNTER — Ambulatory Visit (HOSPITAL_COMMUNITY)
Admission: RE | Admit: 2015-07-05 | Discharge: 2015-07-05 | Disposition: A | Discharge status: Home / Self Care | Payer: Medicare Other | Source: Ambulatory Visit | Attending: Family Medicine | Admitting: Family Medicine

## 2015-07-05 DIAGNOSIS — R1084 Generalized abdominal pain: Secondary | ICD-10-CM

## 2015-07-05 DIAGNOSIS — R109 Unspecified abdominal pain: Secondary | ICD-10-CM | POA: Diagnosis not present

## 2015-07-05 DIAGNOSIS — R932 Abnormal findings on diagnostic imaging of liver and biliary tract: Secondary | ICD-10-CM | POA: Insufficient documentation

## 2015-07-10 ENCOUNTER — Encounter (HOSPITAL_COMMUNITY): Payer: Medicare Other | Admitting: Occupational Therapy

## 2015-07-17 ENCOUNTER — Encounter (HOSPITAL_COMMUNITY): Payer: Medicare Other | Admitting: Occupational Therapy

## 2015-07-17 ENCOUNTER — Encounter (HOSPITAL_COMMUNITY): Payer: Self-pay

## 2015-07-17 NOTE — Therapy (Signed)
[place], [place], [address] Phone: [phone]   Fax:  [phone]  Patient Details  Name: Sara [name] MRN: [id number] Date of Birth: [birth year] Referring Provider:  No ref. provider found  Encounter Date: 07/17/2015 OCCUPATIONAL THERAPY DISCHARGE SUMMARY  Visits from Start of Care: 1  Current functional level related to goals / functional outcomes: OT LONG TERM GOAL #1    Title Pt will return to prior level of functioning and independence in daily tasks.    Time 8   Period Weeks   Status New   OT LONG TERM GOAL #2   Title Pt will decrease pain to 2/10 or less during daily tasks.    Time 8   Period Weeks   Status New   OT LONG TERM GOAL #3   Title Pt will decrease fascial restrictions to trace amounts or less.    Time 8   Period Weeks   Status New   OT LONG TERM GOAL #4   Title Pt will increase RUE AROM to WNL to increase ability to reach into overhead cabinets.    Time 8   Period Weeks   Status New   OT LONG TERM GOAL #5   Title Pt will increase strength to 4+/5 to increase ability to lift lightweight objects into cabinets.    Time 8   Period Weeks   Status New            Remaining deficits: Patient did not return after initial evaluation. No therapy goals met. Patient will be discharged due to failure to return.    Education / Equipment: Table slides Plan:                                                    Patient goals were not met. Patient is being discharged due to not returning since the last visit.  ?????       Ailene Ravel, OTR/L,CBIS  [phone]  07/17/2015, 8:29 AM  [place], [place], [address] Phone: [phone]   Fax:  [phone]

## 2015-07-24 ENCOUNTER — Encounter (HOSPITAL_COMMUNITY): Payer: Medicare Other | Admitting: Occupational Therapy

## 2015-07-31 ENCOUNTER — Encounter (HOSPITAL_COMMUNITY): Payer: Medicare Other | Admitting: Occupational Therapy

## 2015-07-31 NOTE — Telephone Encounter (Signed)
Patient a no show - called

## 2015-08-13 DIAGNOSIS — R109 Unspecified abdominal pain: Secondary | ICD-10-CM | POA: Diagnosis not present

## 2015-08-13 DIAGNOSIS — N3281 Overactive bladder: Secondary | ICD-10-CM | POA: Diagnosis not present

## 2015-08-13 DIAGNOSIS — E039 Hypothyroidism, unspecified: Secondary | ICD-10-CM | POA: Diagnosis not present

## 2015-08-14 DIAGNOSIS — R109 Unspecified abdominal pain: Secondary | ICD-10-CM | POA: Diagnosis not present

## 2015-08-14 DIAGNOSIS — E039 Hypothyroidism, unspecified: Secondary | ICD-10-CM | POA: Diagnosis not present

## 2015-08-21 NOTE — Progress Notes (Signed)
[place] [place] PROGRESS NOTE  Patient Care Team: [name], MD as PCP - General (Family Medicine) Daneil Dolin, MD (Gastroenterology) Josue Hector, MD as Attending Physician (Cardiology)  CHIEF COMPLAINTS/PURPOSE OF CONSULTATION:  DCIS R breast, DUCTAL CARCINOMA IN SITU, MIXED PATTERNS (CRIBRIFORM, COMEDO AND PAPILLARY), INVOLVING MULTIPLE CORES, AT LEAST 9 MM IN MAXIMAL EXTENT IN ANY ONE CORE.  Right Simple mastectomy with axillary sentinel node biopsy with reconstruction and placement of tissue expander 11/20/2014  TNM: pTis, pN0. Ultimate size of tumor, 10 cm. DCIS and LCIS. 7 sentinel nodes removed all negative for disease  Tamoxifen for chemoprevention started in 2016  Sara [name] is a 69 y.o. female who underwent  screening mammography. Asymmetry was reported in the right breast.. The left breast was negative. Ultrasound of this area showed a irrgeular mass in the 12 o'clock location measuring 0.7 x 0.5 x 0.4 cm. Biopsy was performed and showed DCIS with focal necrosis. ER and PR were positive.   A breast MRI showed an 8 x 12 x 8 cm area of abnormal linear clumped enhancement throughout the majority of the central and upper right breast.   She has undergone a simple mastectomy of the right breast and sentinel node procedure. She is also receiving reconstructive therapy.  She really has no complaints. She takes tamoxifen daily. She denies SOB, CP, nausea or vomiting. No new pain, no change in bowel or bladder habits. She is up to date on mammography.   MEDICAL HISTORY:  Past Medical History  Diagnosis Date  . Pure hypercholesterolemia     takes Pravastin daily  . Allergic rhinitis due to pollen   . Cervicalgia   . Diverticulosis of colon (without mention of hemorrhage)   . Morbid obesity ([place])   . Palpitations   . Peptic ulcer, unspecified site, unspecified as acute or chronic, without mention of hemorrhage, perforation,  or obstruction   . Shortness of breath   . H/O hiatal hernia   . HTN (hypertension)     takes Metoprolol and Lisinopril daily  . Hypertension   . GERD (gastroesophageal reflux disease)     takes Omeprazole daily  . Constipation     takes Miralax daily  . Insomnia     takes Ativan nigtly  . Other postablative hypothyroidism     takes SYnthroid daily  . Joint pain     "all of them"  . Joint swelling     "knees, legs, ankles sometimes" (11/20/2014)  . Sleep apnea     slight but doesn't require a CPAP (11/20/2014)  . Graves' disease     "took a pill to correct"  . Headache(784.0)     denies migraines since the 80's but has occ and takes Butalbital prn  . Cervical spondylosis without myelopathy     all over  . Degeneration of cervical intervertebral disc   . Primary localized osteoarthrosis, lower leg   . Arthritis     "knees, back, right elbow; left shoulder; right ankle" (11/20/2014)  . Chronic lower back pain   . Anxiety   . Depression   . Breast cancer, right breast ([place])     "DCIS; zero stage" S/P mastectomy 11/20/2014  . Tendonitis of knee     bilateral    SURGICAL HISTORY: Past Surgical History  Procedure Laterality Date  . Knee arthroscopy Right   . Appendectomy    . Colonoscopy  Sept 2008    RMR: normal rectum,  left-sided diverticula, repeat in 2018  . Abdominal hysterectomy    . Upper gastrointestinal endoscopy    . Cataract extraction w/ intraocular lens  implant, bilateral Bilateral   . Lumpectomy on pelvis      "mass of nerves"  . Diagnostic laparoscopy    . Tubal ligation    . Esophagogastroduodenoscopy    . Anterior lat lumbar fusion  05/13/2012    Procedure: ANTERIOR LATERAL LUMBAR FUSION 2 LEVELS;  Surgeon: Faythe Ghee, MD;  Location: [facility];  Service: Neurosurgery;  Laterality: Left;  Left Lumbar Three-four,Lumbar four-five Extreme Lumbar Interbody Fusion with Percutaneous Pedicle Screws  . Knee arthroscopy with lateral menisectomy Right 10/06/2013     Procedure: KNEE ARTHROSCOPY WITH LATERAL AND MEDIAL MENISECTOMY;  Surgeon: Carole Civil, MD;  [facility]: [facility];  Service: Orthopedics;  Laterality: Right;  END @ 1234  . Foreign body removal Right 10/06/2013    Procedure: REMOVAL FOREIGN BODY EXTREMITY;  Surgeon: Carole Civil, MD;  Location: [facility];  Service: Orthopedics;  Laterality: Right;  . Injection knee Left 10/06/2013    Procedure: KNEE INJECTION;  Surgeon: Carole Civil, MD;  Location: [facility];  Service: Orthopedics;  Laterality: Left;  . Total knee arthroplasty Right 01/24/2014    Procedure: TOTAL KNEE ARTHROPLASTY;  Surgeon: Carole Civil, MD;  Location: [facility];  Service: Orthopedics;  Laterality: Right;  . Total knee arthroplasty Left 08/08/2014    Procedure: TOTAL KNEE ARTHROPLASTY;  Surgeon: Carole Civil, MD;  Location: [facility];  Service: Orthopedics;  Laterality: Left;  Marland Kitchen Mastectomy complete / simple w/ sentinel node biopsy Right 11/20/2014    axillary  . Breast reconstruction with placement of tissue expander and flex hd (acellular hydrated dermis) Right 11/20/2014  . Joint replacement    . Tonsillectomy    . Dilation and curettage of uterus    . Breast lumpectomy Left 1988  . Breast biopsy Left ~ 2014  . Breast biopsy Right 2015  . Simple mastectomy with axillary sentinel node biopsy Right 11/20/2014    Procedure: SIMPLE MASTECTOMY WITH AXILLARY SENTINEL NODE BIOPSY;  Surgeon: Erroll Luna, MD;  Location: [facility];  Service: General;  Laterality: Right;  . Breast reconstruction with placement of tissue expander and flex hd (acellular hydrated dermis) Right 11/20/2014    Procedure: RIGHT BREAST RECONSTRUCTION WITH PLACEMENT OF TISSUE EXPANDER AND ACELLULAR DERMA MATRIX ([place]);  Surgeon: Crissie Reese, MD;  Location: [facility];  Service: Plastics;  Laterality: Right;  . Tissue expander removal Right 12/13/2014    dr Harlow Mares  . Removal of tissue expander and placement of implant Right 12/13/2014     Procedure: REMOVAL OF RIGHT BREAST TISSUE EXPANDER;  Surgeon: Crissie Reese, MD;  Location: [facility];  Service: Plastics;  Laterality: Right;    SOCIAL HISTORY: Social History   Social History  . Marital Status: Married    Spouse Name: N/A  . Number of Children: N/A  . Years of Education: N/A   Occupational History  . disability     since 2008, knee surgery   Social History Main Topics  . Smoking status: Former Smoker -- 1.00 packs/day for 27 years    Types: Cigarettes    Quit date: 12/26/1985  . Smokeless tobacco: Never Used  . Alcohol Use: 2.4 oz/week    4 Glasses of wine, 0 Standard drinks or equivalent per week     Comment: occasional wine  . Drug Use: No  . Sexual Activity: Yes  Birth Control/ Protection: Surgical   Other Topics Concern  . Not on file   Social History Narrative   Currently married for 10 years. Husband's name is Milbert Coulter. She is an excellent singer. 8 children between she and her husband. 23 grand children. 12 great grandchildren.    FAMILY HISTORY: Family History  Problem Relation Age of Onset  . Colon cancer Neg Hx     Mother died at 57 from blood clot, MI  . Heart attack      Father died in 9's from old age  . Heart attack      2 Brothers, 3 sisters  . Stroke Sister   . Osteoarthritis Sister   . Osteoarthritis Brother   . Obesity Brother   . Heart attack Sister    has no family status information on file.   ALLERGIES:  is allergic to propoxyphene hcl; pollen extract; camphor; dust mite extract; latex; molds & smuts; and tomato.  MEDICATIONS:  Current Outpatient Prescriptions  Medication Sig Dispense Refill  . acetaminophen (TYLENOL) 500 MG tablet Take 1,000 mg by mouth every 6 (six) hours as needed for moderate pain.     . diphenhydrAMINE (SOMINEX) 25 MG tablet Take 50 mg by mouth at bedtime as needed for allergies or sleep.    . fluticasone (FLONASE) 50 MCG/ACT nasal spray Place 2 sprays into the nose at bedtime.     . gabapentin  (NEURONTIN) 600 MG tablet 1/2 tab during the day and one tab at bedtime 180 tablet 3  . levothyroxine (SYNTHROID, LEVOTHROID) 88 MCG tablet Take 88 mcg by mouth daily before breakfast.     . LORazepam (ATIVAN) 1 MG tablet Take 1 mg by mouth at bedtime as needed for sleep. For sleep    . losartan-hydrochlorothiazide (HYZAAR) 100-12.5 MG per tablet Take 1 tablet by mouth daily.     . metoCLOPramide (REGLAN) 5 MG tablet Take 5 mg by mouth 2 (two) times daily.    . metoprolol tartrate (LOPRESSOR) 25 MG tablet Take 25 mg by mouth 2 (two) times daily.     Marland Kitchen omeprazole (PRILOSEC) 40 MG capsule Take 40 mg by mouth daily before breakfast.     . oxybutynin (DITROPAN) 5 MG tablet Take 5 mg by mouth daily.    . pravastatin (PRAVACHOL) 40 MG tablet Take 40 mg by mouth daily with breakfast.     . tamoxifen (NOLVADEX) 20 MG tablet Take 1 tablet (20 mg total) by mouth daily. 90 tablet 2  . HYDROcodone-acetaminophen (NORCO/VICODIN) 5-325 MG tablet Take 1 tablet by mouth every 6 (six) hours as needed for moderate pain. 90 tablet 0  . Lidocaine & Menthol-Methyl Sal 5 & 3-10 % THPK Apply topically.    Marland Kitchen PRESCRIPTION MEDICATION Apply 1 application topically as needed. Mixture made by [facility] that contains lidocaine, gabapentin, diclofenac this is compounded into a cream that is applied to knees    . [DISCONTINUED] Ranitidine HCl (ZANTAC 75 PO) Take by mouth.       No current facility-administered medications for this visit.    Review of Systems  Constitutional: Negative for fever and chills. Negative for weight loss and malaise/fatigue.  HENT: Negative for congestion, hearing loss, nosebleeds, sore throat and tinnitus.   Eyes: Negative for blurred vision, double vision, pain and discharge.  Respiratory: Negative for cough, hemoptysis, sputum production, shortness of breath and wheezing.   Cardiovascular: Negative for chest pain, palpitations, claudication, leg swelling and PND.  Gastrointestinal:  Negative for heartburn, nausea, vomiting,  abdominal pain, diarrhea, constipation, blood in stool and melena.  Genitourinary: Negative for dysuria, urgency, frequency and hematuria.  Musculoskeletal: Negative for myalgias, joint pain and falls.  Skin: Negative for itching and rash.  Neurological: Negative for dizziness, tingling, tremors, sensory change, speech change, focal weakness, seizures, loss of consciousness, weakness and headaches.  Endo/Heme/Allergies: Does not bruise/bleed easily.  Psychiatric/Behavioral: Negative for depression, suicidal ideas, memory loss and substance abuse. The patient is not nervous/anxious and does not have insomnia.   See HPI 14 point review of systems was performed and is negative except as detailed under history of present illness and above  PHYSICAL EXAMINATION: ECOG PERFORMANCE STATUS: 0 - Asymptomatic  Filed Vitals:   08/23/15 1106  BP: 117/77  Pulse: 61  Temp: 98 F (36.7 C)  Resp: 16   Filed Weights   08/23/15 1106  Weight: 220 lb (99.791 kg)     Physical Exam  Constitutional: She is oriented to person, place, and time and well-developed, well-nourished, and in no distress.  HENT:  Head: Normocephalic and atraumatic.  Nose: Nose normal.  Mouth/Throat: Oropharynx is clear and moist. No oropharyngeal exudate.  Eyes: Conjunctivae and EOM are normal. Pupils are equal, round, and reactive to light. Right eye exhibits no discharge. Left eye exhibits no discharge. No scleral icterus.  Neck: Normal range of motion. Neck supple. No tracheal deviation present. No thyromegaly present.  Cardiovascular: Normal rate, regular rhythm and normal heart sounds.  Exam reveals no gallop and no friction rub.   No murmur heard. Pulmonary/Chest: Effort normal and breath sounds normal. She has no wheezes. She has no rales.  Abdominal: Soft. Bowel sounds are normal. She exhibits no distension and no mass. There is no tenderness. There is no rebound and no  guarding.  Musculoskeletal: Normal range of motion. She exhibits no edema.  Lymphadenopathy:    She has no cervical adenopathy.  Neurological: She is alert and oriented to person, place, and time. She has normal reflexes. No cranial nerve deficit. Gait normal. Coordination normal.  Skin: Skin is warm and dry. No rash noted.  Psychiatric: Mood, memory, affect and judgment normal.  Nursing note and vitals reviewed.    LABORATORY DATA:  I have reviewed the data as listed Lab Results  Component Value Date   WBC 6.4 04/24/2015   HGB 12.9 04/24/2015   HCT 38.4 04/24/2015   MCV 91.9 04/24/2015   PLT 219 04/24/2015   Results for RAYSHAWN, VISCONTI (MRN [id number])   Ref. Range 04/24/2015 10:43  Sodium Latest Ref Range: 135-145 mmol/L 138  Potassium Latest Ref Range: 3.5-5.1 mmol/L 3.5  Chloride Latest Ref Range: 101-111 mmol/L 105  CO2 Latest Ref Range: 22-32 mmol/L 24  BUN Latest Ref Range: 6-20 mg/dL 11  Creatinine Latest Ref Range: 0.44-1.00 mg/dL 0.87  Calcium Latest Ref Range: 8.9-10.3 mg/dL 8.6 (L)  EGFR (Non-African Amer.) Latest Ref Range: >60 mL/min >60  EGFR (African American) Latest Ref Range: >60 mL/min >60  Glucose Latest Ref Range: 65-99 mg/dL 115 (H)  Anion gap Latest Ref Range: 5-15  9  Alkaline Phosphatase Latest Ref Range: 38-126 U/L 49  Albumin Latest Ref Range: 3.5-5.0 g/dL 3.6  AST Latest Ref Range: 15-41 U/L 33  ALT Latest Ref Range: 14-54 U/L 24  Total Protein Latest Ref Range: 6.5-8.1 g/dL 7.0  Total Bilirubin Latest Ref Range: 0.3-1.2 mg/dL 0.5    RADIOLOGY: I have personally reviewed the radiological images as listed and agreed with the findings in the report.  CLINICAL DATA:  Abdominal pain.  EXAM: ULTRASOUND ABDOMEN COMPLETE  COMPARISON: None.  FINDINGS: Gallbladder: No gallstones or wall thickening visualized. No sonographic Murphy sign noted.  Common bile duct: Diameter: 3.8 mm  Liver: Liver is echogenic consistent fatty infiltration  and/or hepatocellular disease.  IVC: No abnormality visualized.  Pancreas: Visualized portion unremarkable.  Spleen: Size and appearance within normal limits.  Right Kidney: Length: 11.0 cm. Echogenicity within normal limits. No mass or hydronephrosis visualized.  Left Kidney: Length: 11.6 cm. Echogenicity within normal limits. No mass or hydronephrosis visualized.  Abdominal aorta: No aneurysm visualized.  Other findings: None.  IMPRESSION: Echogenic liver consistent with fatty infiltration and/or hepatocellular disease. No acute abnormality identified.   Electronically Signed  By: Marcello Moores Register  On: 07/05/2015 12:35  ASSESSMENT & PLAN:   DCIS/LCIS of the right breast Mastectomy Tamoxifen for chemoprevention  69 year old female who has undergone simple mastectomy and sentinel node procedure for extensive DCIS/LCIS of the right breast. All sentinel nodes were negative for disease. She initially chose to pursue reconstruction but didn't complete it.  She is on chemoprevention with tamoxifen. She wishes to continue. She is tolerating it well. We have called in a refill to [facility].  She is up to date on mammography. I have encouraged physical activity.  I will see her back in 4 months.   All questions were answered. The patient knows to call the clinic with any problems, questions or concerns.   This note was electronically signed.  Kelby Fam. Whitney Muse, MD

## 2015-08-22 ENCOUNTER — Other Ambulatory Visit (HOSPITAL_COMMUNITY): Payer: Self-pay

## 2015-08-23 ENCOUNTER — Encounter (HOSPITAL_COMMUNITY): Payer: Medicare Other

## 2015-08-23 ENCOUNTER — Encounter (HOSPITAL_COMMUNITY): Payer: Self-pay | Admitting: Hematology & Oncology

## 2015-08-23 ENCOUNTER — Encounter (HOSPITAL_COMMUNITY): Payer: Medicare Other | Attending: Hematology & Oncology | Admitting: Hematology & Oncology

## 2015-08-23 ENCOUNTER — Other Ambulatory Visit (HOSPITAL_COMMUNITY): Payer: Self-pay | Admitting: Hematology & Oncology

## 2015-08-23 VITALS — BP 117/77 | HR 61 | Temp 98.0°F | Resp 16 | Wt 220.0 lb

## 2015-08-23 DIAGNOSIS — C50419 Malignant neoplasm of upper-outer quadrant of unspecified female breast: Secondary | ICD-10-CM

## 2015-08-23 DIAGNOSIS — D0511 Intraductal carcinoma in situ of right breast: Secondary | ICD-10-CM | POA: Diagnosis not present

## 2015-08-23 DIAGNOSIS — Z1231 Encounter for screening mammogram for malignant neoplasm of breast: Secondary | ICD-10-CM

## 2015-08-23 DIAGNOSIS — Z09 Encounter for follow-up examination after completed treatment for conditions other than malignant neoplasm: Secondary | ICD-10-CM

## 2015-08-23 NOTE — Progress Notes (Signed)
NO LABS NEEDED PER DR Whitney Muse

## 2015-08-23 NOTE — Patient Instructions (Addendum)
[place] at [facility] Discharge Instructions  RECOMMENDATIONS MADE BY THE CONSULTANT AND ANY TEST RESULTS WILL BE SENT TO YOUR REFERRING PHYSICIAN.   Exam completed by Dr Whitney Muse today We will do a breast exam when you are here for your next visit You could see a plastic surgeon about the swelling on the right side  Return to see the doctor in 4 months Please call the clinic if you have any questions or concerns   Thank you for choosing [place] at [facility] to provide your oncology and hematology care.  To afford each patient quality time with our provider, please arrive at least 15 minutes before your scheduled appointment time.    You need to re-schedule your appointment should you arrive 10 or more minutes late.  We strive to give you quality time with our providers, and arriving late affects you and other patients whose appointments are after yours.  Also, if you no show three or more times for appointments you may be dismissed from the clinic at the providers discretion.     Again, thank you for choosing [facility].  Our hope is that these requests will decrease the amount of time that you wait before being seen by our physicians.       _____________________________________________________________  Should you have questions after your visit to [facility], please contact our office at [phone] between the hours of 8:30 a.m. and 4:30 p.m.  Voicemails left after 4:30 p.m. will not be returned until the following business day.  For prescription refill requests, have your pharmacy contact our office.

## 2015-08-28 ENCOUNTER — Telehealth: Payer: Self-pay | Admitting: Radiology

## 2015-08-28 ENCOUNTER — Other Ambulatory Visit: Payer: Self-pay | Admitting: *Deleted

## 2015-08-28 MED ORDER — HYDROCODONE-ACETAMINOPHEN 5-325 MG PO TABS: 1.0000 | ORAL_TABLET | Freq: Four times a day (QID) | ORAL | Status: DC | PRN

## 2015-08-28 NOTE — Telephone Encounter (Signed)
Patient needs refill on her hydrocodone

## 2015-08-29 ENCOUNTER — Ambulatory Visit (HOSPITAL_COMMUNITY): Payer: Medicare Other

## 2015-08-29 NOTE — Telephone Encounter (Signed)
Prescription available, patient aware  

## 2015-11-18 DIAGNOSIS — I10 Essential (primary) hypertension: Secondary | ICD-10-CM | POA: Diagnosis not present

## 2015-11-18 DIAGNOSIS — E039 Hypothyroidism, unspecified: Secondary | ICD-10-CM | POA: Diagnosis not present

## 2015-11-21 ENCOUNTER — Other Ambulatory Visit (HOSPITAL_COMMUNITY): Payer: Self-pay | Admitting: Family Medicine

## 2015-11-21 DIAGNOSIS — R1011 Right upper quadrant pain: Secondary | ICD-10-CM

## 2015-11-21 DIAGNOSIS — Z853 Personal history of malignant neoplasm of breast: Secondary | ICD-10-CM

## 2015-11-21 DIAGNOSIS — E039 Hypothyroidism, unspecified: Secondary | ICD-10-CM | POA: Diagnosis not present

## 2015-11-21 DIAGNOSIS — I10 Essential (primary) hypertension: Secondary | ICD-10-CM | POA: Diagnosis not present

## 2015-11-26 ENCOUNTER — Ambulatory Visit (HOSPITAL_COMMUNITY)
Admission: RE | Admit: 2015-11-26 | Discharge: 2015-11-26 | Disposition: A | Discharge status: Home / Self Care | Payer: Medicare Other | Source: Ambulatory Visit | Attending: Family Medicine | Admitting: Family Medicine

## 2015-11-26 DIAGNOSIS — M461 Sacroiliitis, not elsewhere classified: Secondary | ICD-10-CM | POA: Insufficient documentation

## 2015-11-26 DIAGNOSIS — K769 Liver disease, unspecified: Secondary | ICD-10-CM | POA: Insufficient documentation

## 2015-11-26 DIAGNOSIS — R1011 Right upper quadrant pain: Secondary | ICD-10-CM | POA: Insufficient documentation

## 2015-11-26 DIAGNOSIS — Z853 Personal history of malignant neoplasm of breast: Secondary | ICD-10-CM | POA: Insufficient documentation

## 2015-11-26 DIAGNOSIS — N2889 Other specified disorders of kidney and ureter: Secondary | ICD-10-CM | POA: Insufficient documentation

## 2015-11-26 DIAGNOSIS — R1900 Intra-abdominal and pelvic swelling, mass and lump, unspecified site: Secondary | ICD-10-CM | POA: Diagnosis not present

## 2015-11-26 DIAGNOSIS — K5730 Diverticulosis of large intestine without perforation or abscess without bleeding: Secondary | ICD-10-CM | POA: Insufficient documentation

## 2015-11-26 MED ORDER — IOHEXOL 350 MG/ML SOLN
100.0000 mL | Freq: Once | INTRAVENOUS | Status: AC | PRN
  Administered 2015-11-26: 100 mL via INTRAVENOUS

## 2015-11-30 ENCOUNTER — Other Ambulatory Visit (HOSPITAL_COMMUNITY): Payer: Self-pay | Admitting: Hematology & Oncology

## 2015-12-02 ENCOUNTER — Other Ambulatory Visit: Payer: Self-pay | Admitting: Urology

## 2015-12-02 DIAGNOSIS — Q625 Duplication of ureter: Secondary | ICD-10-CM | POA: Diagnosis not present

## 2015-12-02 DIAGNOSIS — D49511 Neoplasm of unspecified behavior of right kidney: Secondary | ICD-10-CM | POA: Diagnosis not present

## 2015-12-02 DIAGNOSIS — R3915 Urgency of urination: Secondary | ICD-10-CM | POA: Diagnosis not present

## 2015-12-02 DIAGNOSIS — Z0000 Encounter for general adult medical examination without abnormal findings: Secondary | ICD-10-CM | POA: Diagnosis not present

## 2015-12-20 ENCOUNTER — Other Ambulatory Visit: Payer: Self-pay | Admitting: *Deleted

## 2015-12-20 ENCOUNTER — Other Ambulatory Visit: Payer: Self-pay | Admitting: Orthopedic Surgery

## 2015-12-20 MED ORDER — HYDROCODONE-ACETAMINOPHEN 5-325 MG PO TABS: 1.0000 | ORAL_TABLET | Freq: Four times a day (QID) | ORAL | Status: DC | PRN

## 2015-12-20 NOTE — Telephone Encounter (Signed)
Patient requests refill of:  HYDROcodone-acetaminophen (NORCO/VICODIN) 5-325 MG tablet [id number]      Next scheduled appointment is 01/28/16 for yearly total knee follow up.

## 2015-12-23 ENCOUNTER — Encounter (HOSPITAL_COMMUNITY): Payer: Medicare Other | Attending: Hematology & Oncology | Admitting: Hematology & Oncology

## 2015-12-23 ENCOUNTER — Encounter (HOSPITAL_COMMUNITY): Payer: Self-pay | Admitting: Hematology & Oncology

## 2015-12-23 VITALS — BP 135/85 | HR 59 | Temp 98.2°F | Resp 18 | Wt 225.0 lb

## 2015-12-23 DIAGNOSIS — I890 Lymphedema, not elsewhere classified: Secondary | ICD-10-CM | POA: Diagnosis not present

## 2015-12-23 DIAGNOSIS — N2889 Other specified disorders of kidney and ureter: Secondary | ICD-10-CM | POA: Insufficient documentation

## 2015-12-23 DIAGNOSIS — C649 Malignant neoplasm of unspecified kidney, except renal pelvis: Secondary | ICD-10-CM

## 2015-12-23 DIAGNOSIS — C50411 Malignant neoplasm of upper-outer quadrant of right female breast: Secondary | ICD-10-CM

## 2015-12-23 DIAGNOSIS — D0501 Lobular carcinoma in situ of right breast: Secondary | ICD-10-CM | POA: Diagnosis not present

## 2015-12-23 DIAGNOSIS — D0511 Intraductal carcinoma in situ of right breast: Secondary | ICD-10-CM

## 2015-12-23 DIAGNOSIS — K769 Liver disease, unspecified: Secondary | ICD-10-CM | POA: Insufficient documentation

## 2015-12-23 DIAGNOSIS — K760 Fatty (change of) liver, not elsewhere classified: Secondary | ICD-10-CM

## 2015-12-23 NOTE — Assessment & Plan Note (Signed)
Diffuse hepatic steatosis noted on CT imaging. She has a known history of fatty liver.  CT imaging also saw a small liver lesion at the liver dome.  I advised the patient that this would be followed, in addition per her request we will get her back to GI after her partial nephrectomy.

## 2015-12-23 NOTE — Patient Instructions (Addendum)
[place] at [facility] Discharge Instructions  RECOMMENDATIONS MADE BY THE CONSULTANT AND ANY TEST RESULTS WILL BE SENT TO YOUR REFERRING PHYSICIAN.   Exam and discussion by Dr Whitney Muse today National guidelines for kidney cancer (renal cell) Return to see the doctor in the 2nd week of may  We are gonna get you scheduled with Santiago Glad for may.  She is our genetic counseling  Referral to lymphedema clinic, they will call you with this appt   Please call the clinic if you have any questions or concerns      Thank you for choosing [place] at [facility] to provide your oncology and hematology care.  To afford each patient quality time with our provider, please arrive at least 15 minutes before your scheduled appointment time.   Beginning January 23rd 2017 lab work for the [facility] will be done in the  Main lab at [facility] on 1st floor. If you have a lab appointment with the [place] please come in thru the  Main Entrance and check in at the main information desk  You need to re-schedule your appointment should you arrive 10 or more minutes late.  We strive to give you quality time with our providers, and arriving late affects you and other patients whose appointments are after yours.  Also, if you no show three or more times for appointments you may be dismissed from the clinic at the providers discretion.     Again, thank you for choosing [facility].  Our hope is that these requests will decrease the amount of time that you wait before being seen by our physicians.       _____________________________________________________________  Should you have questions after your visit to [facility], please contact our office at [phone] between the hours of 8:30 a.m. and 4:30 p.m.  Voicemails left after 4:30 p.m. will not be returned until the following business day.  For prescription refill requests, have  your pharmacy contact our office.         Resources For Cancer Patients and their Caregivers ? American Cancer Society: Can assist with transportation, wigs, general needs, runs Look Good Feel Better.        7700101833 ? Cancer Care: Provides financial assistance, online support groups, medication/co-pay assistance.  1-800-813-HOPE 7625066710) ? [facility] Assists [place] cancer patients and their families through emotional , educational and financial support.  956-846-4821 ? [place] DSS Where to apply for food stamps, Medicaid and utility assistance. 410-311-2976 ? [facility]: Transportation to medical appointments. 781-172-9452 ? Social Security Administration: May apply for disability if have a Stage IV cancer. (732)100-7803 614-054-0801 ? [employer], Disability and Transit Services: Assists with nutrition, care and transit needs. 2311521238

## 2015-12-23 NOTE — Assessment & Plan Note (Signed)
She is scheduled for a partial nephrectomy in the next several weeks. She was provided the the patient NCCN guidelines on renal carcinoma.  I will bring her back post surgery to review any additional questions or concerns she may have.

## 2015-12-23 NOTE — Progress Notes (Signed)
[place] PROGRESS NOTE  Patient Care Team: [name], MD as PCP - General (Family Medicine) Daneil Dolin, MD (Gastroenterology) Josue Hector, MD as Attending Physician (Cardiology)  CHIEF COMPLAINTS/PURPOSE OF CONSULTATION:  DCIS R breast, DUCTAL CARCINOMA IN SITU, MIXED PATTERNS (CRIBRIFORM, COMEDO AND PAPILLARY), INVOLVING MULTIPLE CORES, AT LEAST 9 MM IN MAXIMAL EXTENT IN ANY ONE CORE.  Right Simple mastectomy with axillary sentinel node biopsy with reconstruction and placement of tissue expander 11/20/2014  TNM: pTis, pN0. Ultimate size of tumor, 10 cm. DCIS and LCIS. 7 sentinel nodes removed all negative for disease  Tamoxifen for chemoprevention started in 2016  Sara [name] is a 70 y.o. female who underwent  screening mammography. Asymmetry was reported in the right breast.. The left breast was negative. Ultrasound of this area showed a irrgeular mass in the 12 o'clock location measuring 0.7 x 0.5 x 0.4 cm. Biopsy was performed and showed DCIS with focal necrosis. ER and PR were positive.   A breast MRI showed an 8 x 12 x 8 cm area of abnormal linear clumped enhancement throughout the majority of the central and upper right breast.   She underwent a simple mastectomy of the right breast and sentinel node procedure. She started but opted not to complete reconstruction.  She takes tamoxifen daily.   Sara [name] is accompanied by her daughter. I personally reviewed and went over recent CT imaging with the patient, including a right kidney mass that appears to be a renal cell carcinoma and a small lesion in the dome of the right hepatic lobe.   She would like a second opinion regarding her kidney mass. She has already spoken with Dr. Tresa Moore of [facility].  She does not feel overwhelmed, noting when the physician told her about the mass on her kidney, she actually busted out laughing. She notes 'how much bad luck can  you have?"  She has previously been to see Dr. Gala Romney of GI. She has not been to lymphedema therapy.   The patient is here for breast cancer follow up, and wishes to discuss newly discovered renal and liver mass.   MEDICAL HISTORY:  Past Medical History  Diagnosis Date  . Pure hypercholesterolemia     takes Pravastin daily  . Allergic rhinitis due to pollen   . Cervicalgia   . Diverticulosis of colon (without mention of hemorrhage)   . Morbid obesity ([place])   . Palpitations   . Peptic ulcer, unspecified site, unspecified as acute or chronic, without mention of hemorrhage, perforation, or obstruction   . Shortness of breath   . H/O hiatal hernia   . HTN (hypertension)     takes Metoprolol and Lisinopril daily  . Hypertension   . GERD (gastroesophageal reflux disease)     takes Omeprazole daily  . Constipation     takes Miralax daily  . Insomnia     takes Ativan nigtly  . Other postablative hypothyroidism     takes SYnthroid daily  . Joint pain     "all of them"  . Joint swelling     "knees, legs, ankles sometimes" (11/20/2014)  . Sleep apnea     slight but doesn't require a CPAP (11/20/2014)  . Graves' disease     "took a pill to correct"  . Headache(784.0)     denies migraines since the 80's but has occ and takes Butalbital prn  . Cervical spondylosis without myelopathy  all over  . Degeneration of cervical intervertebral disc   . Primary localized osteoarthrosis, lower leg   . Arthritis     "knees, back, right elbow; left shoulder; right ankle" (11/20/2014)  . Chronic lower back pain   . Anxiety   . Depression   . Breast cancer, right breast ([place])     "DCIS; zero stage" S/P mastectomy 11/20/2014  . Tendonitis of knee     bilateral    SURGICAL HISTORY: Past Surgical History  Procedure Laterality Date  . Knee arthroscopy Right   . Appendectomy    . Colonoscopy  Sept 2008    RMR: normal rectum, left-sided diverticula, repeat in 2018  . Abdominal hysterectomy    .  Upper gastrointestinal endoscopy    . Cataract extraction w/ intraocular lens  implant, bilateral Bilateral   . Lumpectomy on pelvis      "mass of nerves"  . Diagnostic laparoscopy    . Tubal ligation    . Esophagogastroduodenoscopy    . Anterior lat lumbar fusion  05/13/2012    Procedure: ANTERIOR LATERAL LUMBAR FUSION 2 LEVELS;  Surgeon: Faythe Ghee, MD;  Location: [facility];  Service: Neurosurgery;  Laterality: Left;  Left Lumbar Three-four,Lumbar four-five Extreme Lumbar Interbody Fusion with Percutaneous Pedicle Screws  . Knee arthroscopy with lateral menisectomy Right 10/06/2013    Procedure: KNEE ARTHROSCOPY WITH LATERAL AND MEDIAL MENISECTOMY;  Surgeon: Carole Civil, MD;  Location: [facility];  Service: Orthopedics;  Laterality: Right;  END @ 1234  . Foreign body removal Right 10/06/2013    Procedure: REMOVAL FOREIGN BODY EXTREMITY;  Surgeon: Carole Civil, MD;  Location: [facility];  Service: Orthopedics;  Laterality: Right;  . Injection knee Left 10/06/2013    Procedure: KNEE INJECTION;  Surgeon: Carole Civil, MD;  Location: [facility];  Service: Orthopedics;  Laterality: Left;  . Total knee arthroplasty Right 01/24/2014    Procedure: TOTAL KNEE ARTHROPLASTY;  Surgeon: Carole Civil, MD;  Location: [facility];  Service: Orthopedics;  Laterality: Right;  . Total knee arthroplasty Left 08/08/2014    Procedure: TOTAL KNEE ARTHROPLASTY;  Surgeon: Carole Civil, MD;  Location: [facility];  Service: Orthopedics;  Laterality: Left;  Marland Kitchen Mastectomy complete / simple w/ sentinel node biopsy Right 11/20/2014    axillary  . Breast reconstruction with placement of tissue expander and flex hd (acellular hydrated dermis) Right 11/20/2014  . Joint replacement    . Tonsillectomy    . Dilation and curettage of uterus    . Breast lumpectomy Left 1988  . Breast biopsy Left ~ 2014  . Breast biopsy Right 2015  . Simple mastectomy with axillary sentinel node biopsy Right 11/20/2014    Procedure:  SIMPLE MASTECTOMY WITH AXILLARY SENTINEL NODE BIOPSY;  Surgeon: Erroll Luna, MD;  Location: ;  Service: General;  Laterality: Right;  . Breast reconstruction with placement of tissue expander and flex hd (acellular hydrated dermis) Right 11/20/2014    Procedure: RIGHT BREAST RECONSTRUCTION WITH PLACEMENT OF TISSUE EXPANDER AND ACELLULAR DERMA MATRIX ([place]);  Surgeon: Crissie Reese, MD;  Location: [facility];  Service: Plastics;  Laterality: Right;  . Tissue expander removal Right 12/13/2014    dr Harlow Mares  . Removal of tissue expander and placement of implant Right 12/13/2014    Procedure: REMOVAL OF RIGHT BREAST TISSUE EXPANDER;  Surgeon: Crissie Reese, MD;  Location: [facility];  Service: Plastics;  Laterality: Right;    SOCIAL HISTORY: Social History   Social History  .  Marital Status: Married    Spouse Name: N/A  . Number of Children: N/A  . Years of Education: N/A   Occupational History  . disability     since 2008, knee surgery   Social History Main Topics  . Smoking status: Former Smoker -- 1.00 packs/day for 27 years    Types: Cigarettes    Quit date: 12/26/1985  . Smokeless tobacco: Never Used  . Alcohol Use: 2.4 oz/week    4 Glasses of wine, 0 Standard drinks or equivalent per week     Comment: occasional wine  . Drug Use: No  . Sexual Activity: Yes    Birth Control/ Protection: Surgical   Other Topics Concern  . Not on file   Social History Narrative   Currently married for 10 years. Husband's name is Milbert Coulter. She is an excellent singer. 8 children between she and her husband. 23 grand children. 12 great grandchildren.    FAMILY HISTORY: Family History  Problem Relation Age of Onset  . Colon cancer Neg Hx     Mother died at 28 from blood clot, MI  . Heart attack      Father died in 24's from old age  . Heart attack      2 Brothers, 3 sisters  . Stroke Sister   . Osteoarthritis Sister   . Osteoarthritis Brother   . Obesity Brother   . Heart attack  Sister    has no family status information on file.   ALLERGIES:  is allergic to propoxyphene hcl; pollen extract; camphor; dust mite extract; latex; molds & smuts; and tomato.  MEDICATIONS:  Current Outpatient Prescriptions  Medication Sig Dispense Refill  . acetaminophen (TYLENOL) 500 MG tablet Take 1,000 mg by mouth every 6 (six) hours as needed for moderate pain.     . diphenhydrAMINE (SOMINEX) 25 MG tablet Take 50 mg by mouth at bedtime as needed for allergies or sleep.    . fluticasone (FLONASE) 50 MCG/ACT nasal spray Place 2 sprays into the nose at bedtime.     . gabapentin (NEURONTIN) 600 MG tablet 1/2 tab during the day and one tab at bedtime 180 tablet 3  . levothyroxine (SYNTHROID, LEVOTHROID) 88 MCG tablet Take 88 mcg by mouth daily before breakfast.     . LORazepam (ATIVAN) 1 MG tablet Take 1 mg by mouth at bedtime as needed for sleep. For sleep    . losartan-hydrochlorothiazide (HYZAAR) 100-12.5 MG per tablet Take 1 tablet by mouth daily.     . metoprolol tartrate (LOPRESSOR) 25 MG tablet Take 25 mg by mouth 2 (two) times daily.     Marland Kitchen omeprazole (PRILOSEC) 40 MG capsule Take 40 mg by mouth daily before breakfast.     . oxybutynin (DITROPAN) 5 MG tablet Take 5 mg by mouth daily.    . pravastatin (PRAVACHOL) 40 MG tablet Take 40 mg by mouth daily with breakfast.     . tamoxifen (NOLVADEX) 20 MG tablet Take 1 tablet by mouth  daily 90 tablet 3  . HYDROcodone-acetaminophen (NORCO/VICODIN) 5-325 MG tablet Take 1 tablet by mouth every 6 (six) hours as needed for moderate pain. (Patient not taking: Reported on 12/23/2015) 90 tablet 0  . Lidocaine & Menthol-Methyl Sal 5 & 3-10 % THPK Apply topically. Reported on 12/23/2015    . metoCLOPramide (REGLAN) 5 MG tablet Take 5 mg by mouth 2 (two) times daily. Reported on 12/23/2015    . PRESCRIPTION MEDICATION Apply 1 application topically as needed. Reported on 12/23/2015    . [  DISCONTINUED] Ranitidine HCl (ZANTAC 75 PO) Take by mouth.       No  current facility-administered medications for this visit.    Review of Systems  Constitutional: Negative for fever and chills. Negative for weight loss and malaise/fatigue.  HENT: Negative for congestion, hearing loss, nosebleeds, sore throat and tinnitus.   Eyes: Negative for blurred vision, double vision, pain and discharge.  Respiratory: Negative for cough, hemoptysis, sputum production, shortness of breath and wheezing.   Cardiovascular: Negative for chest pain, palpitations, claudication, leg swelling and PND.  Gastrointestinal: Negative for heartburn, nausea, vomiting, abdominal pain, diarrhea, constipation, blood in stool and melena.  Genitourinary: Negative for dysuria, urgency, frequency and hematuria.  Musculoskeletal: Negative for myalgias, joint pain and falls.  Skin: Negative for itching and rash.  Neurological: Negative for dizziness, tingling, tremors, sensory change, speech change, focal weakness, seizures, loss of consciousness, weakness and headaches.  Endo/Heme/Allergies: Does not bruise/bleed easily.  Psychiatric/Behavioral: Negative for depression, suicidal ideas, memory loss and substance abuse. The patient is not nervous/anxious and does not have insomnia.   See HPI 14 point review of systems was performed and is negative except as detailed under history of present illness and above  PHYSICAL EXAMINATION: ECOG PERFORMANCE STATUS: 0 - Asymptomatic  Filed Vitals:   12/23/15 1300  BP: 135/85  Pulse: 59  Temp: 98.2 F (36.8 C)  Resp: 18   Filed Weights   12/23/15 1300  Weight: 225 lb (102.059 kg)    Physical Exam  Constitutional: She is oriented to person, place, and time and well-developed, well-nourished, and in no distress. Wears glasses. HENT:  Head: Normocephalic and atraumatic.  Nose: Nose normal.  Mouth/Throat: Oropharynx is clear and moist. No oropharyngeal exudate.  Eyes: Conjunctivae and EOM are normal. Pupils are equal, round, and reactive to  light. Right eye exhibits no discharge. Left eye exhibits no discharge. No scleral icterus.  Neck: Normal range of motion. Neck supple. No tracheal deviation present. No thyromegaly present.  Cardiovascular: Normal rate, regular rhythm and normal heart sounds.  Exam reveals no gallop and no friction rub.   No murmur heard. Pulmonary/Chest: Effort normal and breath sounds normal. She has no wheezes. She has no rales.  Abdominal: Soft. Bowel sounds are normal. She exhibits no distension and no mass. There is no tenderness. There is no rebound and no guarding.  Musculoskeletal: Normal range of motion. She exhibits RUE edema, new from prior visit, confined to the RUE Lymphadenopathy:    She has no cervical adenopathy.  Neurological: She is alert and oriented to person, place, and time. She has normal reflexes. No cranial nerve deficit. Gait normal. Coordination normal.  Skin: Skin is warm and dry. No rash noted.  Psychiatric: Mood, memory, affect and judgment normal.  Nursing note and vitals reviewed.  LABORATORY DATA:  I have reviewed the data as listed Results for BRITTANYA, WINBURN (MRN [id number]) as of 12/23/2015 13:13  Ref. Range 04/24/2015 10:43  Sodium Latest Ref Range: 135-145 mmol/L 138  Potassium Latest Ref Range: 3.5-5.1 mmol/L 3.5  Chloride Latest Ref Range: 101-111 mmol/L 105  CO2 Latest Ref Range: 22-32 mmol/L 24  BUN Latest Ref Range: 6-20 mg/dL 11  Creatinine Latest Ref Range: 0.44-1.00 mg/dL 0.87  Calcium Latest Ref Range: 8.9-10.3 mg/dL 8.6 (L)  EGFR (Non-African Amer.) Latest Ref Range: >60 mL/min >60  EGFR (African American) Latest Ref Range: >60 mL/min >60  Glucose Latest Ref Range: 65-99 mg/dL 115 (H)  Anion gap Latest Ref Range: 5-15  9  Alkaline Phosphatase Latest Ref Range: 38-126 U/L 49  Albumin Latest Ref Range: 3.5-5.0 g/dL 3.6  AST Latest Ref Range: 15-41 U/L 33  ALT Latest Ref Range: 14-54 U/L 24  Total Protein Latest Ref Range: 6.5-8.1 g/dL 7.0  Total  Bilirubin Latest Ref Range: 0.3-1.2 mg/dL 0.5  WBC Latest Ref Range: 4.0-10.5 K/uL 6.4  RBC Latest Ref Range: 3.87-5.11 MIL/uL 4.18  Hemoglobin Latest Ref Range: 12.0-15.0 g/dL 12.9  HCT Latest Ref Range: 36.0-46.0 % 38.4  MCV Latest Ref Range: 78.0-100.0 fL 91.9  MCH Latest Ref Range: 26.0-34.0 pg 30.9  MCHC Latest Ref Range: 30.0-36.0 g/dL 33.6  RDW Latest Ref Range: 11.5-15.5 % 14.7  Platelets Latest Ref Range: 150-400 K/uL 219  Neutrophils Latest Ref Range: 43-77 % 49  Lymphocytes Latest Ref Range: 12-46 % 41  Monocytes Relative Latest Ref Range: 3-12 % 7  Eosinophil Latest Ref Range: 0-5 % 2  Basophil Latest Ref Range: 0-1 % 1  NEUT# Latest Ref Range: 1.7-7.7 K/uL 3.2  Lymphocyte # Latest Ref Range: 0.7-4.0 K/uL 2.6  Monocyte # Latest Ref Range: 0.1-1.0 K/uL 0.4  Eosinophils Absolute Latest Ref Range: 0.0-0.7 K/uL 0.1  Basophils Absolute Latest Ref Range: 0.0-0.1 K/uL 0.0   RADIOLOGY: I have personally reviewed the radiological images as listed and agreed with the findings in the report. Study Result     CLINICAL DATA: Right upper quadrant abdominal pain for 4 months. Reported swelling. History of breast cancer on the right treated with mastectomy.  EXAM: CT ABDOMEN AND PELVIS WITHOUT AND WITH CONTRAST  TECHNIQUE: Multidetector CT imaging of the abdomen and pelvis was performed following the standard protocol before and following the bolus administration of intravenous contrast.  CONTRAST: 161m OMNIPAQUE IOHEXOL 350 MG/ML SOLN  COMPARISON: 07/05/2015  FINDINGS: Lower chest: Right mastectomy. Mild cardiomegaly.  Hepatobiliary: 0.8 by 0.5 cm hypodense lesion in the dome of the right hepatic lobe, image 15 series 5, nonspecific due to small size, no prior cross-sectional imaging in this area for comparison. Diffuse hepatic steatosis.  Pancreas: Unremarkable  Spleen: Unremarkable  Adrenals/Urinary Tract: Low-density fullness of the medial  limb right adrenal gland without overt adrenal mass.  2.9 by 2.4 cm partially exophytic mass of the right kidney lower pole anteriorly demonstrates arterial phase enhancement, compatible with malignancy. There is scarring in the right kidney upper pole.  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  Stomach/Bowel: Descending and sigmoid colon diverticulosis without active diverticulitis.  Vascular/Lymphatic: No significant atherosclerosis in the abdomen. No tumor thrombus in the right renal vein. No pathologic retroperitoneal adenopathy. A left periaortic node measures 0.6 cm in short axis on image 78 series 4, within normal size limits.  Reproductive: Uterus absent. Bilateral ovarian remnants unremarkable.  Other: No supplemental non-categorized findings.  Musculoskeletal: Mild bilateral chronic sacroiliitis with sclerosis along the sacroiliac joints. Posterolateral rod and pedicle screw fixation at [id number] complicating features; chronic mild fused anterolisthesis at L3-4. Solid interbody fusion at L3-4 and L4-5.  IMPRESSION: 1. 2.9 by 2.4 cm partially exophytic enhancing mass of the right kidney lower pole anteriorly. Based on morphology and appearance I favor this representing a renal cell carcinoma, metastatic breast cancer less likely. No tumor thrombus in the renal vein or regional adenopathy identified. 2. 8 by 5 mm hypodense lesion in the dome of the right hepatic lobe, statistically likely to be benign but technically nonspecific due to  small size. 3. Additional findings at individual levels are as follows: Mild cardiomegaly. Diffuse hepatic steatosis. Duplicated right collecting system. Scarring in the right kidney upper pole. Descending and sigmoid colon diverticulosis. Mild chronic bilateral sacroiliitis. These results  will be called to the ordering clinician or representative by the Radiologist Assistant, and communication documented in the PACS or zVision Dashboard.   Electronically Signed  By: Van Clines M.D.  On: 11/26/2015 14:54     ASSESSMENT & PLAN:   DCIS/LCIS of the right breast Mastectomy Tamoxifen for chemoprevention  Malignant neoplasm of upper outer quadrant of female breast Extensive DCIS/LCIS of right breast, S/P simple right mastectomy and SLN which was negative for metastatic disease on 11/20/2014 by Dr. Brantley Stage.  Beginning Tamoxifen daily as chemoprevention on 01/01/2015. She is up to date on mammography of the Left breast. She will need breast exam at follow-up. She is tolerating tamoxifen without difficulty.   Initially she opted for reconstruction but did not complete it.  LYMPHEDEMA  She will be referred to Lymphedema therapy.  Right kidney mass She is scheduled for a partial nephrectomy in the next several weeks. She was provided the the patient NCCN guidelines on renal carcinoma.  I will bring her back post surgery to review any additional questions or concerns she may have.   Fatty liver Diffuse hepatic steatosis noted on CT imaging. She has a known history of fatty liver.  CT imaging also saw a small liver lesion at the liver dome.  I advised the patient that this would be followed, in addition per her request we will get her back to GI after her partial nephrectomy.  All questions were answered. The patient knows to call the clinic with any problems, questions or concerns.   This note was electronically signed.  This document serves as a record of services personally performed by Ancil Linsey, MD. It was created on her behalf by Arlyce Harman, a trained medical scribe. The creation of this record is based on the scribe's personal observations and the provider's statements to them. This document has been checked and approved by the attending  provider.  I have reviewed the above documentation for accuracy and completeness, and I agree with the above.  Kelby Fam. Whitney Muse, MD

## 2015-12-23 NOTE — Assessment & Plan Note (Signed)
Extensive DCIS/LCIS of right breast, S/P simple right mastectomy and SLN which was negative for metastatic disease on 11/20/2014 by Dr. Brantley Stage.  Beginning Tamoxifen daily as chemoprevention on 01/01/2015. She is up to date on mammography of the Left breast. She will need breast exam at follow-up. She is tolerating tamoxifen without difficulty.

## 2015-12-25 NOTE — Telephone Encounter (Signed)
DR. Aline Brochure DECLINED REFILL. PATIENT NEEDS APPOINTMENT

## 2015-12-25 NOTE — Telephone Encounter (Signed)
Routing to carol to advise and schedule

## 2015-12-26 NOTE — Telephone Encounter (Signed)
Called patient on 12/25/15 and scheduled accordingly.

## 2016-01-02 ENCOUNTER — Encounter (HOSPITAL_COMMUNITY)
Admission: RE | Admit: 2016-01-02 | Discharge: 2016-01-02 | Disposition: A | Discharge status: Home / Self Care | Payer: Medicare Other | Source: Ambulatory Visit | Attending: Urology | Admitting: Urology

## 2016-01-02 ENCOUNTER — Encounter (HOSPITAL_COMMUNITY): Payer: Self-pay

## 2016-01-02 ENCOUNTER — Ambulatory Visit (HOSPITAL_COMMUNITY): Payer: Medicare Other | Attending: Hematology & Oncology | Admitting: Physical Therapy

## 2016-01-02 DIAGNOSIS — I972 Postmastectomy lymphedema syndrome: Secondary | ICD-10-CM | POA: Insufficient documentation

## 2016-01-02 DIAGNOSIS — Z01812 Encounter for preprocedural laboratory examination: Secondary | ICD-10-CM | POA: Diagnosis not present

## 2016-01-02 DIAGNOSIS — Z01810 Encounter for preprocedural cardiovascular examination: Secondary | ICD-10-CM | POA: Insufficient documentation

## 2016-01-02 DIAGNOSIS — R001 Bradycardia, unspecified: Secondary | ICD-10-CM | POA: Diagnosis not present

## 2016-01-02 DIAGNOSIS — N2889 Other specified disorders of kidney and ureter: Secondary | ICD-10-CM | POA: Insufficient documentation

## 2016-01-02 DIAGNOSIS — C50919 Malignant neoplasm of unspecified site of unspecified female breast: Secondary | ICD-10-CM | POA: Diagnosis not present

## 2016-01-02 DIAGNOSIS — I10 Essential (primary) hypertension: Secondary | ICD-10-CM | POA: Insufficient documentation

## 2016-01-02 HISTORY — DX: Urinary tract infection, site not specified: N39.0

## 2016-01-02 HISTORY — DX: Other specified disorders of kidney and ureter: N28.89

## 2016-01-02 HISTORY — DX: Anesthesia of skin: R20.0

## 2016-01-02 HISTORY — DX: Personal history of other specified conditions: Z87.898

## 2016-01-02 LAB — BASIC METABOLIC PANEL
Anion gap: 8 (ref 5–15)
BUN: 10 mg/dL (ref 6–20)
CO2: 29 mmol/L (ref 22–32)
Calcium: 9.1 mg/dL (ref 8.9–10.3)
Chloride: 102 mmol/L (ref 101–111)
Creatinine, Ser: 0.77 mg/dL (ref 0.44–1.00)
GFR calc Af Amer: >60 mL/min (ref >60)
GFR calc non Af Amer: >60 mL/min (ref >60)
Glucose, Bld: 90 mg/dL (ref 65–99)
Potassium: 3.8 mmol/L (ref 3.5–5.1)
Sodium: 139 mmol/L (ref 135–145)

## 2016-01-02 LAB — CBC
HCT: 39.2 % (ref 36.0–46.0)
Hemoglobin: 13.5 g/dL (ref 12.0–15.0)
MCH: 31.5 pg (ref 26.0–34.0)
MCHC: 34.4 g/dL (ref 30.0–36.0)
MCV: 91.4 fL (ref 78.0–100.0)
Platelets: 240 10*3/uL (ref 150–400)
RBC: 4.29 MIL/uL (ref 3.87–5.11)
RDW: 13.3 % (ref 11.5–15.5)
WBC: 7.8 10*3/uL (ref 4.0–10.5)

## 2016-01-02 LAB — ABO/RH: ABO/RH(D): B POS

## 2016-01-02 NOTE — Progress Notes (Signed)
EKG reviewed by Dr.Hodierne also reviewed stress and echo from 2014 - OK for surgery

## 2016-01-02 NOTE — Therapy (Signed)
[facility] [place], [place], [address] Phone: [phone]   Fax:  [phone]  Physical Therapy Evaluation  Patient Details  Name: Sara [name] MRN: [id number] Date of Birth: 1946/01/24 Referring Provider: Ancil Linsey  Encounter Date: 01/02/2016      PT End of Session - 01/02/16 1337    Visit Number 1   Number of Visits 1   Authorization Type Tok - Visit Number 1   Authorization - Number of Visits 1   PT Start Time [id number]   PT Stop Time 1339   PT Time Calculation (min) 35 min   Activity Tolerance Patient tolerated treatment well      Past Medical History  Diagnosis Date  . Pure hypercholesterolemia     takes Pravastin daily  . Allergic rhinitis due to pollen   . Cervicalgia   . Diverticulosis of colon (without mention of hemorrhage)   . Morbid obesity ([place])   . Palpitations   . Peptic ulcer, unspecified site, unspecified as acute or chronic, without mention of hemorrhage, perforation, or obstruction   . Shortness of breath   . H/O hiatal hernia   . HTN (hypertension)     takes Metoprolol and Lisinopril daily  . Hypertension   . GERD (gastroesophageal reflux disease)     takes Omeprazole daily  . Constipation     takes Miralax daily  . Insomnia     takes Ativan nigtly  . Other postablative hypothyroidism     takes SYnthroid daily  . Joint pain     "all of them"  . Joint swelling     "knees, legs, ankles sometimes" (11/20/2014)  . Graves' disease     "took a pill to correct"  . Headache(784.0)     denies migraines since the 80's but has occ and takes Butalbital prn  . Cervical spondylosis without myelopathy     all over  . Degeneration of cervical intervertebral disc   . Primary localized osteoarthrosis, lower leg   . Arthritis     "knees, back, right elbow; left shoulder; right ankle" (11/20/2014)  . Chronic lower back pain   . Anxiety   . Depression   . Breast  cancer, right breast ([place])     "DCIS; zero stage" S/P mastectomy 11/20/2014  . Tendonitis of knee     bilateral  . Numbness     LOWER LEGS  . Renal mass     RIGHT  . Recurrent UTI   . H/O urinary frequency   . Sleep apnea     slight but doesn't require a CPAP (11/20/2014)    Past Surgical History  Procedure Laterality Date  . Knee arthroscopy Right   . Appendectomy    . Colonoscopy  Sept 2008    RMR: normal rectum, left-sided diverticula, repeat in 2018  . Abdominal hysterectomy    . Upper gastrointestinal endoscopy    . Cataract extraction w/ intraocular lens  implant, bilateral Bilateral   . Lumpectomy on pelvis      "mass of nerves"  . Diagnostic laparoscopy    . Tubal ligation    . Esophagogastroduodenoscopy    . Anterior lat lumbar fusion  05/13/2012    Procedure: ANTERIOR LATERAL LUMBAR FUSION 2 LEVELS;  Surgeon: Faythe Ghee, MD;  Location: [facility];  Service: Neurosurgery;  Laterality: Left;  Left Lumbar Three-four,Lumbar four-five Extreme Lumbar Interbody Fusion with Percutaneous Pedicle Screws  . Knee arthroscopy with  lateral menisectomy Right 10/06/2013    Procedure: KNEE ARTHROSCOPY WITH LATERAL AND MEDIAL MENISECTOMY;  Surgeon: Carole Civil, MD;  Location: [facility];  Service: Orthopedics;  Laterality: Right;  END @ 1234  . Foreign body removal Right 10/06/2013    Procedure: REMOVAL FOREIGN BODY EXTREMITY;  Surgeon: Carole Civil, MD;  Location: [facility];  Service: Orthopedics;  Laterality: Right;  . Injection knee Left 10/06/2013    Procedure: KNEE INJECTION;  Surgeon: Carole Civil, MD;  Location: [facility];  Service: Orthopedics;  Laterality: Left;  . Total knee arthroplasty Right 01/24/2014    Procedure: TOTAL KNEE ARTHROPLASTY;  Surgeon: Carole Civil, MD;  Location: [facility];  Service: Orthopedics;  Laterality: Right;  . Total knee arthroplasty Left 08/08/2014    Procedure: TOTAL KNEE ARTHROPLASTY;  Surgeon: Carole Civil, MD;  Location: [facility];   Service: Orthopedics;  Laterality: Left;  Mar[place] Kitchen Mastectomy complete / simple w/ sentinel node biopsy Right 11/20/2014    axillary  . Breast reconstruction with placement of tissue expander and flex hd (acellular hydrated dermis) Right 11/20/2014  . Joint replacement    . Tonsillectomy    . Dilation and curettage of uterus    . Breast lumpectomy Left 1988  . Breast biopsy Left ~ 2014  . Breast biopsy Right 2015  . Simple mastectomy with axillary sentinel node biopsy Right 11/20/2014    Procedure: SIMPLE MASTECTOMY WITH AXILLARY SENTINEL NODE BIOPSY;  Surgeon: Erroll Luna, MD;  Location: [facility];  Service: General;  Laterality: Right;  . Breast reconstruction with placement of tissue expander and flex hd (acellular hydrated dermis) Right 11/20/2014    Procedure: RIGHT BREAST RECONSTRUCTION WITH PLACEMENT OF TISSUE EXPANDER AND ACELLULAR DERMA MATRIX ([place]);  Surgeon: Crissie Reese, MD;  Location: [facility];  Service: Plastics;  Laterality: Right;  . Tissue expander removal Right 12/13/2014    dr Harlow Mares  . Removal of tissue expander and placement of implant Right 12/13/2014    Procedure: REMOVAL OF RIGHT BREAST TISSUE EXPANDER;  Surgeon: Crissie Reese, MD;  Location: [facility];  Service: Plastics;  Laterality: Right;    There were no vitals filed for this visit.       Subjective Assessment - 01/02/16 1308    Subjective Ms. Donlin states that she feels that she has had swelling under her arm since the first of the year.  She is also having some numbness.   She has been referred to skilled therapy for education in lymphedema.             [facility] PT Assessment - 01/02/16 0001    Assessment   Medical Diagnosis Right upper extremity lymphedema   Referring Provider Larene Beach [place]   Onset Date/Surgical Date 09/24/15   Hand Dominance Right   Prior Therapy none   Precautions   Precautions Other (comment)   Precaution Comments lymphedema   Restrictions   Weight Bearing Restrictions No    Balance Screen   Has the patient fallen in the past 6 months No   Has the patient had a decrease in activity level because of a fear of falling?  No   Is the patient reluctant to leave their home because of a fear of falling?  No   Prior Function   Level of Independence Independent   Vocation Unemployed   Leisure none at this time    Cognition   Overall Cognitive Status Within Functional Limits for tasks assessed   Observation/Other Assessments  Other Surveys  --  Lymphedema life impact 26           LYMPHEDEMA/ONCOLOGY QUESTIONNAIRE - 01/02/16 1302    Type   Cancer Type Right breast    Surgeries   Mastectomy Date 11/20/14   Treatment   Active Chemotherapy Treatment No   Past Chemotherapy Treatment No   Past Radiation Treatment No   Current Hormone Treatment Yes   Drug Name Tamoxifin   What other symptoms do you have   Are you Having Heaviness or Tightness Yes   Are you having Pain No   Are you having pitting edema No   Is it Hard or Difficult finding clothes that fit No  but has noticed increased tightness in her right arm    Do you have infections No   [place] Sign No   Lymphedema Stage   Stage STAGE 1 SPONTANEOUSLY REVERSIBLE   Lymphedema Assessments   Lymphedema Assessments Upper extremities   Right Upper Extremity Lymphedema   At Axilla  40.5 cm   15 cm Proximal to Olecranon Process 41 cm   10 cm Proximal to Olecranon Process 40.5 cm   Olecranon Process 31 cm   15 cm Proximal to Ulnar Styloid Process 28.8 cm   10 cm Proximal to Ulnar Styloid Process 23.5 cm   Just Proximal to Ulnar Styloid Process 16.8 cm   Across Hand at [employer] 19 cm   At [place] of 2nd Digit 7 cm   At [facility] of Thumb 7 cm   Left Upper Extremity Lymphedema   At Axilla  41.5 cm   15 cm Proximal to Olecranon Process 39.5 cm   10 cm Proximal to Olecranon Process 39.8 cm   Olecranon Process 30.5 cm   15 cm Proximal to Ulnar Styloid Process 27.8 cm   10 cm Proximal to Ulnar Styloid  Process 23.1 cm   Just Proximal to Ulnar Styloid Process 16.8 cm   Across Hand at [employer] 19 cm   At [place] of 2nd Digit 6.7 cm   At [facility] of Thumb 6.9 cm                        PT Education - 01/02/16 1336    Education provided Yes   Education Details self massage, use of neoprene sport shirt, tips on lymphedema sheet    Person(s) Educated Patient   Methods Explanation   Comprehension Verbalized understanding          PT Short Term Goals - 01/02/16 1343    PT SHORT TERM GOAL #1   Title Pt to be independent is self massage to decrease edema.    Time 1   Period Days   Status Achieved   PT SHORT TERM GOAL #2   Title Pt to be independent in diaphragmic breathing to increase contraction rate of they lymphatic system to decrease swelling    Time 1   Period Days   Status Achieved   PT SHORT TERM GOAL #3   Title Pt to be knowledgeable of where she can go to acquire more information on lymphedema,(www.lymphedma.org) National lymphedema network.    Time 1   Period Days   Status Achieved                  Plan - 01/02/16 1338    Clinical Impression Statement Ms. Cajina is a 70 yo who was diagnosed with right breast cancer last year.  She  has noticed some increased swelling in her subaxillary area since January.  She has been referred to skilled physical therapy. Examination demonsrtates very mild objective findings.  Therapist and patient went over self care such as wearing compression shirts, exercises and self massaging techniques.  At this time I do not feel that Ms. Vanaken will need skilled intervention beyond the education that she was given today.     Rehab Potential Good   PT Frequency 1x / week   PT Duration --  1 week   PT Treatment/Interventions ADLs/Self Care Home Management;Patient/family education   PT Next Visit Plan Pt one time visit only    Consulted and Agree with Plan of Care Patient      Patient will benefit from skilled  therapeutic intervention in order to improve the following deficits and impairments:  Increased edema  Visit Diagnosis: Postmastectomy lymphedema - Plan: PT plan of care cert/re-cert      G-Codes - [id number]    Functional Assessment Tool Used life impact    Functional Limitation Other PT primary   Other PT Primary Current Status [id number]) At least 20 percent but less than 40 percent impaired, limited or restricted   Other PT Primary Goal Status [id number]) At least 20 percent but less than 40 percent impaired, limited or restricted   Other PT Primary Discharge Status [id number]) At least 20 percent but less than 40 percent impaired, limited or restricted       Problem List Patient Active Problem List   Diagnosis Date Noted  . Right kidney mass 12/23/2015  . Liver lesion 12/23/2015  . Malignant neoplasm of upper outer quadrant of female breast ([place]) 10/11/2014  . S/P total knee replacement 08/08/2014  . Difficulty in walking(719.7) 02/15/2014  . Postoperative stiffness of total knee replacement ([place]) 02/15/2014  . Pain in joint, lower leg 02/15/2014  . Abnormality of gait 10/11/2013  . S/P arthroscopy of right knee 10/09/2013  . Radicular pain of right lower extremity 07/25/2013  . Knee instability 07/25/2013  . Right knee pain 07/25/2013  . Chest pain 04/25/2013  . Patellar tendonitis 09/07/2012  . OA (osteoarthritis) of knee 02/09/2012  . GERD (gastroesophageal reflux disease) 12/01/2011  . Dysphagia 12/01/2011  . RUQ pain 12/01/2011  . Fatty liver 12/01/2011  . Hematochezia 12/01/2011  . INTERMITTENT VERTIGO 09/12/2010  . KNEE, ARTHRITIS, DEGEN./OSTEO 04/03/2009  . CHEST PAIN, RECURRENT 03/07/2009  . OTHER DYSPHAGIA 03/07/2009  . PALPITATIONS 02/06/2009  . LIVER FUNCTION TESTS, ABNORMAL, HX OF 01/01/2009  . MORBID OBESITY 12/31/2008  . PUD 12/31/2008  . BACK PAIN 12/24/2008  . ANSERINE BURSITIS 12/24/2008  . BENIGN POSITIONAL VERTIGO 07/16/2008  . H N P-LUMBAR  04/02/2008  . SPINAL STENOSIS, CERVICAL 02/15/2008  . SPINAL STENOSIS, LUMBAR 02/15/2008  . Patellar tendinitis 10/19/2007  . TEAR LATERAL MENISCUS 06/13/2007  . OBESITY NOS 10/29/2006  . ALLERGIC RHINITIS, SEASONAL 10/29/2006  . HYPOTHYROIDISM 09/07/2006  . HYPERLIPIDEMIA 09/07/2006  . HYPERTENSION 09/07/2006  . GERD 09/07/2006  . DIVERTICULOSIS, COLON 09/07/2006  . OVERACTIVE BLADDER 09/07/2006  . FIBROCYSTIC BREAST DISEASE 09/07/2006  . OSTEOARTHRITIS 09/07/2006    Rayetta Humphrey, PT CLT [phone] 01/02/2016, 1:52 PM  [facility] [address] [place], [place], [address] Phone: [phone]   Fax:  [phone]  Name: YEJIN FAUSNAUGH MRN: [id number] Date of Birth: 1946-02-23

## 2016-01-02 NOTE — Patient Instructions (Addendum)
YOUR PROCEDURE IS SCHEDULED ON :  01/08/16  REPORT TO [facility] MAIN ENTRANCE FOLLOW SIGNS TO EAST ELEVATOR - GO TO 3rd FLOOR CHECK IN AT 3 EAST NURSES STATION (SHORT STAY) AT:  6:30 AM  CALL THIS NUMBER IF YOU HAVE PROBLEMS THE MORNING OF SURGERY [phone]  REMEMBER:ONLY 1 PER PERSON MAY GO TO SHORT STAY WITH YOU TO GET READY THE MORNING OF YOUR SURGERY  DO NOT EAT FOOD OR DRINK LIQUIDS AFTER MIDNIGHT  TAKE THESE MEDICINES THE MORNING OF SURGERY:  LEVOTHYROXINE / METOPROLOL / OMEPRAZOLE / PRAVASTATIN / TAMOXIFEN  YOU MAY NOT HAVE ANY METAL ON YOUR BODY INCLUDING HAIR PINS AND PIERCING'S. DO NOT WEAR JEWELRY, MAKEUP, LOTIONS, POWDERS OR PERFUMES. DO NOT WEAR NAIL POLISH. DO NOT SHAVE 48 HRS PRIOR TO SURGERY. MEN MAY SHAVE FACE AND NECK.  DO NOT [place]. [facility] IS NOT RESPONSIBLE FOR VALUABLES.  CONTACTS, DENTURES OR PARTIALS MAY NOT BE WORN TO SURGERY. LEAVE SUITCASE IN CAR. CAN BE BROUGHT TO ROOM AFTER SURGERY.  PATIENTS DISCHARGED THE DAY OF SURGERY WILL NOT BE ALLOWED TO DRIVE HOME.  PLEASE READ OVER THE FOLLOWING INSTRUCTION SHEETS _________________________________________________________________________________                                          [facility] - PREPARING FOR SURGERY  Before surgery, you can play an important role.  Because skin is not sterile, your skin needs to be as free of germs as possible.  You can reduce the number of germs on your skin by washing with CHG (chlorahexidine gluconate) soap before surgery.  CHG is an antiseptic cleaner which kills germs and bonds with the skin to continue killing germs even after washing. Please DO NOT use if you have an allergy to CHG or antibacterial soaps.  If your skin becomes reddened/irritated stop using the CHG and inform your nurse when you arrive at Short Stay. Do not shave (including legs and underarms) for at least 48 hours prior to the first CHG shower.  You may  shave your face. Please follow these instructions carefully:   1.  Shower with CHG Soap the night before surgery and the  morning of Surgery.   2.  If you choose to wash your hair, wash your hair first as usual with your  normal  Shampoo.   3.  After you shampoo, rinse your hair and body thoroughly to remove the  shampoo.                                         4.  Use CHG as you would any other liquid soap.  You can apply chg directly  to the skin and wash . Gently wash with scrungie or clean wascloth    5.  Apply the CHG Soap to your body ONLY FROM THE NECK DOWN.   Do not use on open                           Wound or open sores. Avoid contact with eyes, ears mouth and genitals (private parts).  Genitals (private parts) with your normal soap.              6.  Wash thoroughly, paying special attention to the area where your surgery  will be performed.   7.  Thoroughly rinse your body with warm water from the neck down.   8.  DO NOT shower/wash with your normal soap after using and rinsing off  the CHG Soap .                9.  Pat yourself dry with a clean towel.             10.  Wear clean night clothes to bed after shower             11.  Place clean sheets on your bed the night of your first shower and do not  sleep with pets.  Day of Surgery : Do not apply any lotions/deodorants the morning of surgery.  Please wear clean clothes to the hospital/surgery center.  FAILURE TO FOLLOW THESE INSTRUCTIONS MAY RESULT IN THE CANCELLATION OF YOUR SURGERY    PATIENT SIGNATURE_________________________________  ______________________________________________________________________

## 2016-01-07 NOTE — Anesthesia Preprocedure Evaluation (Addendum)
Anesthesia Evaluation  Patient identified by MRN, date of birth, ID band Patient awake    Reviewed: Allergy & Precautions, NPO status , Patient's Chart, lab work & pertinent test results  Airway Mallampati: II  TM Distance: >3 FB Neck ROM: Full    Dental   Pulmonary shortness of breath, sleep apnea , former smoker,    breath sounds clear to auscultation       Cardiovascular hypertension,  Rhythm:Regular Rate:Normal     Neuro/Psych    GI/Hepatic hiatal hernia, GERD  ,  Endo/Other  Hypothyroidism Morbid obesity  Renal/GU      Musculoskeletal  (+) Arthritis , Ortho surgeries in past   Abdominal (+) + obese,   Peds  Hematology   Anesthesia Other Findings   Reproductive/Obstetrics                           Anesthesia Physical Anesthesia Plan  ASA: III  Anesthesia Plan: General   Post-op Pain Management:    Induction: Intravenous  Airway Management Planned: Oral ETT  Additional Equipment:   Intra-op Plan:   Post-operative Plan:   Informed Consent: I have reviewed the patients History and Physical, chart, labs and discussed the procedure including the risks, benefits and alternatives for the proposed anesthesia with the patient or authorized representative who has indicated his/her understanding and acceptance.   Dental advisory given  Plan Discussed with: CRNA and Surgeon  Anesthesia Plan Comments:         Anesthesia Quick Evaluation

## 2016-01-08 ENCOUNTER — Encounter (HOSPITAL_COMMUNITY): Payer: Self-pay | Admitting: *Deleted

## 2016-01-08 ENCOUNTER — Inpatient Hospital Stay (HOSPITAL_COMMUNITY): Payer: Medicare Other | Admitting: Anesthesiology

## 2016-01-08 ENCOUNTER — Inpatient Hospital Stay (HOSPITAL_COMMUNITY)
Admission: RE | Admit: 2016-01-08 | Discharge: 2016-01-09 | DRG: 658 | Disposition: A | Discharge status: Home / Self Care | Payer: Medicare Other | Source: Ambulatory Visit | Attending: Urology | Admitting: Urology

## 2016-01-08 ENCOUNTER — Encounter (HOSPITAL_COMMUNITY)
Admission: RE | Disposition: A | Discharge status: Home / Self Care | Payer: Self-pay | Source: Ambulatory Visit | Attending: Urology

## 2016-01-08 DIAGNOSIS — Q625 Duplication of ureter: Secondary | ICD-10-CM | POA: Diagnosis not present

## 2016-01-08 DIAGNOSIS — K449 Diaphragmatic hernia without obstruction or gangrene: Secondary | ICD-10-CM | POA: Diagnosis present

## 2016-01-08 DIAGNOSIS — G4730 Sleep apnea, unspecified: Secondary | ICD-10-CM | POA: Diagnosis present

## 2016-01-08 DIAGNOSIS — I10 Essential (primary) hypertension: Secondary | ICD-10-CM | POA: Diagnosis not present

## 2016-01-08 DIAGNOSIS — K5900 Constipation, unspecified: Secondary | ICD-10-CM | POA: Diagnosis not present

## 2016-01-08 DIAGNOSIS — Z853 Personal history of malignant neoplasm of breast: Secondary | ICD-10-CM

## 2016-01-08 DIAGNOSIS — L299 Pruritus, unspecified: Secondary | ICD-10-CM | POA: Diagnosis not present

## 2016-01-08 DIAGNOSIS — Z8249 Family history of ischemic heart disease and other diseases of the circulatory system: Secondary | ICD-10-CM | POA: Diagnosis not present

## 2016-01-08 DIAGNOSIS — C641 Malignant neoplasm of right kidney, except renal pelvis: Secondary | ICD-10-CM | POA: Diagnosis not present

## 2016-01-08 DIAGNOSIS — Z981 Arthrodesis status: Secondary | ICD-10-CM

## 2016-01-08 DIAGNOSIS — Z96653 Presence of artificial knee joint, bilateral: Secondary | ICD-10-CM | POA: Diagnosis present

## 2016-01-08 DIAGNOSIS — E7800 Pure hypercholesterolemia, unspecified: Secondary | ICD-10-CM | POA: Diagnosis present

## 2016-01-08 DIAGNOSIS — N2889 Other specified disorders of kidney and ureter: Secondary | ICD-10-CM | POA: Diagnosis not present

## 2016-01-08 DIAGNOSIS — C649 Malignant neoplasm of unspecified kidney, except renal pelvis: Secondary | ICD-10-CM

## 2016-01-08 DIAGNOSIS — Z87440 Personal history of urinary (tract) infections: Secondary | ICD-10-CM | POA: Diagnosis not present

## 2016-01-08 DIAGNOSIS — Z87891 Personal history of nicotine dependence: Secondary | ICD-10-CM | POA: Diagnosis not present

## 2016-01-08 DIAGNOSIS — K219 Gastro-esophageal reflux disease without esophagitis: Secondary | ICD-10-CM | POA: Diagnosis present

## 2016-01-08 DIAGNOSIS — Z79899 Other long term (current) drug therapy: Secondary | ICD-10-CM | POA: Diagnosis not present

## 2016-01-08 DIAGNOSIS — E890 Postprocedural hypothyroidism: Secondary | ICD-10-CM | POA: Diagnosis not present

## 2016-01-08 HISTORY — DX: Malignant neoplasm of unspecified kidney, except renal pelvis: C64.9

## 2016-01-08 HISTORY — PX: ROBOTIC ASSITED PARTIAL NEPHRECTOMY: SHX6087

## 2016-01-08 LAB — HEMOGLOBIN AND HEMATOCRIT, BLOOD
HCT: 40.5 % (ref 36.0–46.0)
Hemoglobin: 13.7 g/dL (ref 12.0–15.0)

## 2016-01-08 LAB — TYPE AND SCREEN
ABO/RH(D): B POS
Antibody Screen: NEGATIVE

## 2016-01-08 SURGERY — NEPHRECTOMY, PARTIAL, ROBOT-ASSISTED
Anesthesia: General | Laterality: Right

## 2016-01-08 MED ORDER — LEVOTHYROXINE SODIUM 88 MCG PO TABS
88.0000 ug | ORAL_TABLET | Freq: Every day | ORAL | Status: DC
  Administered 2016-01-09: 88 ug via ORAL
  Filled 2016-01-08: qty 1

## 2016-01-08 MED ORDER — ONDANSETRON HCL 4 MG/2ML IJ SOLN: 4.0000 mg | INTRAMUSCULAR | Status: DC | PRN

## 2016-01-08 MED ORDER — TAMOXIFEN CITRATE 10 MG PO TABS
20.0000 mg | ORAL_TABLET | Freq: Every day | ORAL | Status: DC
  Administered 2016-01-09: 20 mg via ORAL
  Filled 2016-01-08: qty 2

## 2016-01-08 MED ORDER — HYDROMORPHONE HCL 2 MG/ML IJ SOLN
INTRAMUSCULAR | Status: AC
  Filled 2016-01-08: qty 1

## 2016-01-08 MED ORDER — SUCCINYLCHOLINE CHLORIDE 20 MG/ML IJ SOLN
INTRAMUSCULAR | Status: DC | PRN
  Administered 2016-01-08 (×2): 100 mg via INTRAVENOUS

## 2016-01-08 MED ORDER — OXYCODONE HCL 5 MG PO TABS: 5.0000 mg | ORAL_TABLET | ORAL | Status: DC | PRN

## 2016-01-08 MED ORDER — DIPHENHYDRAMINE HCL 50 MG/ML IJ SOLN: 12.5000 mg | Freq: Four times a day (QID) | INTRAMUSCULAR | Status: DC | PRN

## 2016-01-08 MED ORDER — LIDOCAINE HCL (CARDIAC) 20 MG/ML IV SOLN
INTRAVENOUS | Status: DC | PRN
  Administered 2016-01-08: 100 mg via INTRAVENOUS

## 2016-01-08 MED ORDER — LIDOCAINE HCL (CARDIAC) 20 MG/ML IV SOLN
INTRAVENOUS | Status: AC
  Filled 2016-01-08: qty 5

## 2016-01-08 MED ORDER — PROPOFOL 10 MG/ML IV BOLUS
INTRAVENOUS | Status: DC | PRN
  Administered 2016-01-08: 50 mg via INTRAVENOUS
  Administered 2016-01-08: 200 mg via INTRAVENOUS

## 2016-01-08 MED ORDER — CEFAZOLIN SODIUM-DEXTROSE 2-4 GM/100ML-% IV SOLN
INTRAVENOUS | Status: AC
  Filled 2016-01-08: qty 100

## 2016-01-08 MED ORDER — LACTATED RINGERS IR SOLN
Status: DC | PRN
  Administered 2016-01-08: 1000 mL

## 2016-01-08 MED ORDER — LORAZEPAM 0.5 MG PO TABS: 0.5000 mg | ORAL_TABLET | Freq: Every evening | ORAL | Status: DC | PRN

## 2016-01-08 MED ORDER — FENTANYL CITRATE (PF) 100 MCG/2ML IJ SOLN
INTRAMUSCULAR | Status: DC | PRN
  Administered 2016-01-08 (×5): 50 ug via INTRAVENOUS

## 2016-01-08 MED ORDER — PROPOFOL 10 MG/ML IV BOLUS
INTRAVENOUS | Status: AC
  Filled 2016-01-08: qty 40

## 2016-01-08 MED ORDER — ROCURONIUM BROMIDE 100 MG/10ML IV SOLN
INTRAVENOUS | Status: DC | PRN
  Administered 2016-01-08: 40 mg via INTRAVENOUS

## 2016-01-08 MED ORDER — SUGAMMADEX SODIUM 200 MG/2ML IV SOLN
INTRAVENOUS | Status: AC
  Filled 2016-01-08: qty 2

## 2016-01-08 MED ORDER — METOPROLOL TARTRATE 25 MG PO TABS
25.0000 mg | ORAL_TABLET | Freq: Two times a day (BID) | ORAL | Status: DC
  Administered 2016-01-08 – 2016-01-09 (×2): 25 mg via ORAL
  Filled 2016-01-08 (×2): qty 1

## 2016-01-08 MED ORDER — SUGAMMADEX SODIUM 200 MG/2ML IV SOLN
INTRAVENOUS | Status: DC | PRN
  Administered 2016-01-08: 200 mg via INTRAVENOUS

## 2016-01-08 MED ORDER — EPHEDRINE SULFATE 50 MG/ML IJ SOLN
INTRAMUSCULAR | Status: DC | PRN
  Administered 2016-01-08 (×2): 5 mg via INTRAVENOUS

## 2016-01-08 MED ORDER — DEXAMETHASONE SODIUM PHOSPHATE 10 MG/ML IJ SOLN
INTRAMUSCULAR | Status: DC | PRN
  Administered 2016-01-08: 10 mg via INTRAVENOUS

## 2016-01-08 MED ORDER — HYDROMORPHONE HCL 1 MG/ML IJ SOLN
INTRAMUSCULAR | Status: AC
  Filled 2016-01-08: qty 1

## 2016-01-08 MED ORDER — ACETAMINOPHEN 500 MG PO TABS
1000.0000 mg | ORAL_TABLET | Freq: Four times a day (QID) | ORAL | Status: DC
  Administered 2016-01-08 – 2016-01-09 (×3): 1000 mg via ORAL
  Filled 2016-01-08 (×3): qty 2

## 2016-01-08 MED ORDER — MIDAZOLAM HCL 5 MG/5ML IJ SOLN
INTRAMUSCULAR | Status: DC | PRN
  Administered 2016-01-08: 2 mg via INTRAVENOUS

## 2016-01-08 MED ORDER — BUPIVACAINE LIPOSOME 1.3 % IJ SUSP
INTRAMUSCULAR | Status: DC | PRN
  Administered 2016-01-08: 20 mL

## 2016-01-08 MED ORDER — GABAPENTIN 600 MG PO TABS: 300.0000 mg | ORAL_TABLET | Freq: Two times a day (BID) | ORAL | Status: DC

## 2016-01-08 MED ORDER — STERILE WATER FOR IRRIGATION IR SOLN
Status: DC | PRN
  Administered 2016-01-08: 1000 mL

## 2016-01-08 MED ORDER — DEXTROSE-NACL 5-0.45 % IV SOLN
INTRAVENOUS | Status: DC
  Administered 2016-01-08 – 2016-01-09 (×3): via INTRAVENOUS

## 2016-01-08 MED ORDER — FENTANYL CITRATE (PF) 250 MCG/5ML IJ SOLN
INTRAMUSCULAR | Status: AC
  Filled 2016-01-08: qty 5

## 2016-01-08 MED ORDER — HYDROCODONE-ACETAMINOPHEN 5-325 MG PO TABS: 1.0000 | ORAL_TABLET | Freq: Four times a day (QID) | ORAL | Status: DC | PRN

## 2016-01-08 MED ORDER — GABAPENTIN 300 MG PO CAPS
300.0000 mg | ORAL_CAPSULE | Freq: Every day | ORAL | Status: DC
  Administered 2016-01-09: 300 mg via ORAL
  Filled 2016-01-08: qty 1

## 2016-01-08 MED ORDER — LACTATED RINGERS IV SOLN: INTRAVENOUS | Status: DC

## 2016-01-08 MED ORDER — MIDAZOLAM HCL 2 MG/2ML IJ SOLN
INTRAMUSCULAR | Status: AC
  Filled 2016-01-08: qty 2

## 2016-01-08 MED ORDER — HYDROMORPHONE HCL 1 MG/ML IJ SOLN
INTRAMUSCULAR | Status: DC | PRN
  Administered 2016-01-08: 0.5 mg via INTRAVENOUS

## 2016-01-08 MED ORDER — HYDROMORPHONE HCL 1 MG/ML IJ SOLN
0.2500 mg | INTRAMUSCULAR | Status: DC | PRN
  Administered 2016-01-08 (×4): 0.5 mg via INTRAVENOUS

## 2016-01-08 MED ORDER — PANTOPRAZOLE SODIUM 40 MG PO TBEC
80.0000 mg | DELAYED_RELEASE_TABLET | Freq: Every day | ORAL | Status: DC
  Administered 2016-01-09: 80 mg via ORAL
  Filled 2016-01-08: qty 2

## 2016-01-08 MED ORDER — HYDROMORPHONE HCL 1 MG/ML IJ SOLN
0.5000 mg | INTRAMUSCULAR | Status: DC | PRN
  Administered 2016-01-08: 1 mg via INTRAVENOUS
  Administered 2016-01-08: 0.5 mg via INTRAVENOUS
  Administered 2016-01-08: 1 mg via INTRAVENOUS
  Administered 2016-01-08: 0.5 mg via INTRAVENOUS
  Administered 2016-01-08 – 2016-01-09 (×2): 1 mg via INTRAVENOUS
  Filled 2016-01-08 (×6): qty 1

## 2016-01-08 MED ORDER — DIPHENHYDRAMINE HCL 12.5 MG/5ML PO ELIX: 12.5000 mg | ORAL_SOLUTION | Freq: Four times a day (QID) | ORAL | Status: DC | PRN

## 2016-01-08 MED ORDER — SODIUM CHLORIDE 0.9 % IJ SOLN
INTRAMUSCULAR | Status: AC
  Filled 2016-01-08: qty 20

## 2016-01-08 MED ORDER — ONDANSETRON HCL 4 MG/2ML IJ SOLN
INTRAMUSCULAR | Status: AC
  Filled 2016-01-08: qty 2

## 2016-01-08 MED ORDER — LACTATED RINGERS IV SOLN
INTRAVENOUS | Status: DC | PRN
  Administered 2016-01-08: 08:00:00 via INTRAVENOUS

## 2016-01-08 MED ORDER — MANNITOL 25 % IV SOLN
25.0000 g | Freq: Once | INTRAVENOUS | Status: AC
  Administered 2016-01-08 (×2): 12.5 g via INTRAVENOUS
  Filled 2016-01-08: qty 100

## 2016-01-08 MED ORDER — BUPIVACAINE LIPOSOME 1.3 % IJ SUSP
20.0000 mL | Freq: Once | INTRAMUSCULAR | Status: DC
  Filled 2016-01-08: qty 20

## 2016-01-08 MED ORDER — ROCURONIUM BROMIDE 100 MG/10ML IV SOLN
INTRAVENOUS | Status: AC
  Filled 2016-01-08: qty 1

## 2016-01-08 MED ORDER — DEXAMETHASONE SODIUM PHOSPHATE 10 MG/ML IJ SOLN
INTRAMUSCULAR | Status: AC
  Filled 2016-01-08: qty 1

## 2016-01-08 MED ORDER — CEFAZOLIN SODIUM-DEXTROSE 2-4 GM/100ML-% IV SOLN
2.0000 g | INTRAVENOUS | Status: AC
  Administered 2016-01-08: 2 g via INTRAVENOUS
  Filled 2016-01-08: qty 100

## 2016-01-08 MED ORDER — GABAPENTIN 300 MG PO CAPS
600.0000 mg | ORAL_CAPSULE | Freq: Every day | ORAL | Status: DC
  Administered 2016-01-08: 600 mg via ORAL
  Filled 2016-01-08: qty 2

## 2016-01-08 MED ORDER — ONDANSETRON HCL 4 MG/2ML IJ SOLN
INTRAMUSCULAR | Status: DC | PRN
  Administered 2016-01-08: 4 mg via INTRAVENOUS

## 2016-01-08 SURGICAL SUPPLY — 72 items
APL ESCP 34 STRL LF DISP (HEMOSTASIS) ×1
APPLICATOR SURGIFLO ENDO (HEMOSTASIS) ×2 IMPLANT
BAG SPEC RTRVL LRG 6X4 10 (ENDOMECHANICALS) ×1
CHLORAPREP W/TINT 26ML (MISCELLANEOUS) ×2 IMPLANT
CLIP LIGATING HEM O LOK PURPLE (MISCELLANEOUS) ×2 IMPLANT
CLIP LIGATING HEMO LOK XL GOLD (MISCELLANEOUS) IMPLANT
CLIP LIGATING HEMO O LOK GREEN (MISCELLANEOUS) ×5 IMPLANT
CLIP SUT LAPRA TY ABSORB (SUTURE) ×3 IMPLANT
COVER TIP SHEARS 8 DVNC (MISCELLANEOUS) ×1 IMPLANT
COVER TIP SHEARS 8MM DA VINCI (MISCELLANEOUS) ×1
DECANTER SPIKE VIAL GLASS SM (MISCELLANEOUS) ×2 IMPLANT
DRAIN CHANNEL 15F RND FF 3/16 (WOUND CARE) ×2 IMPLANT
DRAPE COLUMN DVNC XI (DISPOSABLE) ×1 IMPLANT
DRAPE DA VINCI XI COLUMN (DISPOSABLE) ×1
DRAPE INCISE IOBAN 66X45 STRL (DRAPES) ×2 IMPLANT
DRAPE LAPAROSCOPIC ABDOMINAL (DRAPES) ×2 IMPLANT
DRAPE SHEET LG 3/4 BI-LAMINATE (DRAPES) ×2 IMPLANT
DRSG TEGADERM 4X4.75 (GAUZE/BANDAGES/DRESSINGS) ×1 IMPLANT
ELECT PENCIL ROCKER SW 15FT (MISCELLANEOUS) ×2 IMPLANT
ELECT REM PT RETURN 9FT ADLT (ELECTROSURGICAL) ×2
ELECTRODE REM PT RTRN 9FT ADLT (ELECTROSURGICAL) ×1 IMPLANT
EVACUATOR SILICONE 100CC (DRAIN) ×2 IMPLANT
GAUZE SPONGE 2X2 8PLY STRL LF (GAUZE/BANDAGES/DRESSINGS) IMPLANT
GLOVE BIO SURGEON STRL SZ 6.5 (GLOVE) ×1 IMPLANT
GLOVE BIOGEL M STRL SZ7.5 (GLOVE) ×2 IMPLANT
GLOVE SURG SS PI 6.5 STRL IVOR (GLOVE) ×2 IMPLANT
GLOVE SURG SS PI 7.5 STRL IVOR (GLOVE) ×4 IMPLANT
GOWN STRL REUS W/TWL LRG LVL3 (GOWN DISPOSABLE) ×4 IMPLANT
HEMOSTAT SURGICEL 4X8 (HEMOSTASIS) ×2 IMPLANT
KIT BASIN OR (CUSTOM PROCEDURE TRAY) ×2 IMPLANT
LIQUID BAND (GAUZE/BANDAGES/DRESSINGS) ×2 IMPLANT
LOOP VESSEL MAXI BLUE (MISCELLANEOUS) ×2 IMPLANT
MARKER SKIN DUAL TIP RULER LAB (MISCELLANEOUS) ×2 IMPLANT
NDL INSUFFLATION 14GA 120MM (NEEDLE) ×1 IMPLANT
NEEDLE INSUFFLATION 14GA 120MM (NEEDLE) ×2 IMPLANT
NS IRRIG 1000ML POUR BTL (IV SOLUTION) ×1 IMPLANT
PORT ACCESS TROCAR AIRSEAL 12 (TROCAR) IMPLANT
PORT ACCESS TROCAR AIRSEAL 5M (TROCAR) ×1
POSITIONER SURGICAL ARM (MISCELLANEOUS) ×4 IMPLANT
POUCH SPECIMEN RETRIEVAL 10MM (ENDOMECHANICALS) ×2 IMPLANT
RELOAD STAPLE 60 2.6 WHT THN (STAPLE) IMPLANT
RELOAD STAPLER WHITE 60MM (STAPLE) IMPLANT
SET TRI-LUMEN FLTR TB AIRSEAL (TUBING) ×1 IMPLANT
SET TUBE IRRIG SUCTION NO TIP (IRRIGATION / IRRIGATOR) ×1 IMPLANT
SOLUTION ELECTROLUBE (MISCELLANEOUS) ×2 IMPLANT
SPONGE GAUZE 2X2 STER 10/PKG (GAUZE/BANDAGES/DRESSINGS) ×1
SPONGE LAP 4X18 X RAY DECT (DISPOSABLE) ×2 IMPLANT
STAPLE ECHEON FLEX 60 POW ENDO (STAPLE) IMPLANT
STAPLER RELOAD WHITE 60MM (STAPLE)
SURGIFLO W/THROMBIN 8M KIT (HEMOSTASIS) ×3 IMPLANT
SUT ETHILON 3 0 PS 1 (SUTURE) ×1 IMPLANT
SUT MNCRL AB 4-0 PS2 18 (SUTURE) ×4 IMPLANT
SUT PDS AB 0 CT1 36 (SUTURE) ×1 IMPLANT
SUT PDS AB 1 CT1 27 (SUTURE) ×4 IMPLANT
SUT V-LOC BARB 180 2/0GR6 GS22 (SUTURE)
SUT VIC AB 0 CT1 27 (SUTURE) ×8
SUT VIC AB 0 CT1 27XBRD ANTBC (SUTURE) ×4 IMPLANT
SUT VIC AB 2-0 SH 27 (SUTURE) ×4
SUT VIC AB 2-0 SH 27X BRD (SUTURE) ×2 IMPLANT
SUT VLOC BARB 180 ABS3/0GR12 (SUTURE) ×4
SUTURE V-LC BRB 180 2/0GR6GS22 (SUTURE) IMPLANT
SUTURE VLOC BRB 180 ABS3/0GR12 (SUTURE) ×1 IMPLANT
TAPE STRIPS DRAPE STRL (GAUZE/BANDAGES/DRESSINGS) ×2 IMPLANT
TOWEL OR 17X26 10 PK STRL BLUE (TOWEL DISPOSABLE) ×4 IMPLANT
TOWEL OR NON WOVEN STRL DISP B (DISPOSABLE) ×2 IMPLANT
TRAY FOLEY BAG SILVER LF 16FR (SET/KITS/TRAYS/PACK) ×1 IMPLANT
TRAY FOLEY W/METER SILVER 14FR (SET/KITS/TRAYS/PACK) ×1 IMPLANT
TRAY FOLEY W/METER SILVER 16FR (SET/KITS/TRAYS/PACK) ×1 IMPLANT
TRAY LAPAROSCOPIC (CUSTOM PROCEDURE TRAY) ×2 IMPLANT
TROCAR BLADELESS OPT 5 100 (ENDOMECHANICALS) IMPLANT
TROCAR XCEL 12X100 BLDLESS (ENDOMECHANICALS) ×2 IMPLANT
WATER STERILE IRR 1500ML POUR (IV SOLUTION) ×2 IMPLANT

## 2016-01-08 NOTE — Brief Op Note (Signed)
01/08/2016  11:36 AM  PATIENT:  Sara [name]  70 y.o. female  PRE-OPERATIVE DIAGNOSIS:  RIGHT RENAL MASS  POST-OPERATIVE DIAGNOSIS:  RIGHT RENAL MASS  PROCEDURE:  Procedure(s): XI ROBOTIC ASSITED PARTIAL NEPHRECTOMY (Right)  SURGEON:  Surgeon(s) and Role:    * Alexis Frock, MD - Primary  PHYSICIAN ASSISTANT:   ASSISTANTS: Debbrah Alar, PA   ANESTHESIA:   general  EBL:  Total I/O In: 2000 [I.V.:2000] Out: 500 [Urine:300; Blood:200]  BLOOD ADMINISTERED:none  DRAINS: JP to bulb, foley to gravity   LOCAL MEDICATIONS USED:  MARCAINE     SPECIMEN:  Source of Specimen:  Rt renal mass  DISPOSITION OF SPECIMEN:  PATHOLOGY  COUNTS:  YES  TOURNIQUET:  * No tourniquets in log *  DICTATION: .Other Dictation: Dictation Number [id number]  PLAN OF CARE: Admit to inpatient   PATIENT DISPOSITION:  PACU - hemodynamically stable.   Delay start of Pharmacological VTE agent (>24hrs) due to surgical blood loss or risk of bleeding: yes

## 2016-01-08 NOTE — Discharge Instructions (Signed)

## 2016-01-08 NOTE — Op Note (Signed)
NAMEMarland Kitchen  Sara, [name] NO.:  [id number]  MEDICAL RECORD NO.:  [id number]  LOCATION:  [facility]                         FACILITY:  [facility]  PHYSICIAN:  Sara Frock, MD     DATE OF BIRTH:  1946-01-07  DATE OF PROCEDURE: 01/08/2016                               OPERATIVE REPORT  DIAGNOSIS:  Right renal mass.  PROCEDURES: 1. Robotic-assisted laparoscopic right partial nephrectomy. 2. Intraoperative ultrasound with interpretation.  ESTIMATED BLOOD LOSS:  200 mL.  COMPLICATION:  None.  SPECIMENS:  Right lower pole renal mass for permanent pathology.  FINDINGS: 1. One artery, two vein right renovascular anatomy. 2. Right double ureters proximally. 3. Approximately 50% endophytic right lower pole renal mass, solid     abutting collecting system by renal ultrasound.  DRAINS: 1. Jackson-Pratt drain to bulb suction. 2. Foley catheter to straight drain.  ASSISTANT:  Sara Alar, PA.  WARM ISCHEMIA TIME: 20 minutes  INDICATION:  Sara [name] is a very pleasant 70 year old lady who was found incidentally to have an enhancing right lower pole renal mass by her primary care physician on workup of abdominal pain and it did appear to be a clinically-localized with solid enhancing consistent with renal cell carcinoma.  Options were discussed including surveillance protocols versus ablation versus surgical extirpation with and without nephron sparing and she adamantly wished to proceed with right partial nephrectomy and she was deemed suitable candidate.  Informed consent was obtained and placed in the medical record.  PROCEDURE IN DETAIL:  The patient being Sara [name], was verified. Procedure being right robotic partial nephrectomy was confirmed. Procedure was carried out.  Time-out was performed.  Intravenous antibiotics were administered and general endotracheal anesthesia was introduced.  The patient was placed into a right side up, full flank position, applying  15 degrees of stable flexion, superior arm elevator, axillary roll, sequential compression devices, bottom leg bent, top leg straight.  She was further fashioned to the operative table using beanbag and then 3-inch tape over foam padding across her supraxiphoid chest and her pelvis, and sterile field was created by prepping and draping the patient's entire right flank and abdomen using chlorhexidine gluconate.  Next, a high-flow, low-pressure pneumoperitoneum was obtained using Veress technique in the right lower quadrant having passed the aspiration and drop test.  Next, an 8-mm robotic camera port was placed and positioned approximately 4 handbreadths superolateral to the umbilicus.  Laparoscopic examination of the peritoneal cavity revealed no significant adhesions and no visceral injury.  Additional ports were then placed as follows:  Right subcostal 8-mm robotic port, right subxiphoid 5-mm assistant port, through which, a self-locking grasper was used to place the liver on gentle superior traction.  An 8- mm right far lateral robotic port approximately 4 fingerbreadths superomedial to the anterior superior iliac spine and a right inferior paramedian robotic port approximately 4 fingerbreadths superior to the pelvis and two 12-mm assistant ports in the midline, one approximately 3 fingerbreadths below the camera port, this was in the infraumbilical crease and another 2 fingerbreadths above the camera port.  Robot was docked and passed through the electronic checks.  Initial attention was directed at development of the retroperitoneum.  Incision was made lateral  to the ascending colon from the area of the cecum towards the area of the hepatic flexure.  The colon was carefully swept medially. The lower pole of the kidney was identified and placed on gentle lateral traction.  The duodenum was encountered and very carefully kocherized medially, such that, a live medial to the lateral  surface of the inferior vena cava.  Dissection proceeded just medial to the lower pole of the kidney and the gonadal and double ureters were identified as anticipated.  Ureters were placed on gentle lateral traction. Dissection was proceeded within this triangle superiorly towards the area of the renal hilum.  Renal hilum was somewhat complex consisted of a single dominant artery and two-vein right renovascular anatomy as anticipated.  The lower pole vein being source of the gonadal vein and the upper vein having the dominant single renal artery trunk immediately posterior to it.  All three of these vessels were marked with vessel loops.  Attention was directed at identification of the mass and dissection proceeded down to the anterior surface of the lower pole of the kidney and the mass in question was found and very carefully, circumferentially dissected keeping a bucket-handle of fat with the mass.  The margins of the mass were somewhat indistinct with its interface with the parenchyma and it was clearly felt that intraoperative ultrasound would be warranted.  As such, an intraoperative ultrasound was performed using a drop-in probe.  The intraoperative ultrasound revealed a solid right lower pole renal mass as anticipated.  This did appear to directly abut the lower pole collecting system and renal sinus fat.  Using intraoperative ultrasound and external cues, the surface of the kidney was scored keeping what appeared to be a normal rim of kidney with the partial nephrectomy specimen.  Next, warm ischemia was achieved by placing two small bulldog clamps on the artery alone and partial nephrectomy was performed using cold scissors again keeping what appeared to be a small rim of normal parenchyma with the partial nephrectomy specimen.  The collecting system was entered purposefully giving the location of the lower pole mass. First layer renorrhaphy was performed using a single running  V-Loc suture, oversewing several small venous sinuses and the collecting system.  A second layer of running 3-0 V-Loc was also applied, oversewing these structures further.  The bulldog clamps were then removed for total warm ischemia time of 20 minutes.  A bolster was then applied and parenchymal apposition sutures of interrupted 0 Vicryl were placed x3.  This resulted in excellent parenchymal apposition and complete hemostasis of the partial nephrectomy area.  The kidney had been mobilized significantly during dissection.  Such that, it was felt that nephropexy and reapproximation of Gerota's was warranted.  As such, a 0 PDS was used with a figure-of-eight stitch to anchor the lateral renal capsule to the lateral sidewall to prevent excessive medial displacement or rotation and 10 mL of FloSeal was applied to the partial nephrectomy bed and Gerota's was reapproximated using running Vicryl. Following these maneuvers, hemostasis appeared excellent.  All sponge and needle counts were correct.  Both renal vein, the artery and both ureters were again identified and visibly uninjured.  The partial nephrectomy specimen was placed into an EndoCatch bag.  A closed suction drain was brought through the previous right lateral robotic port site to the area of the peritoneal cavity.  Robot was then undocked.  The inferior assistant port site was closed at the level of the fascia using Carter-Thomason suture passer and  0 Vicryl under laparoscopic vision. Drain stitch was applied.  The specimen was retrieved by extending the superior most assistant port site for total distance of approximately 2 cm to the level of fascia and skin removing the partial nephrectomy specimen and setting it aside for permanent pathology.  This site was closed at the level of fascia using figure-of-eight PDS, the level of Scarpa's using running 2-0 Vicryl.  All incision sites were infiltrated with dilute lyophilized  Marcaine and closed at the level of the skin using subcuticular Monocryl followed by Dermabond. Procedure was then terminated.  The patient tolerated the procedure well.  There were no immediate periprocedural complications.  The patient was taken to the postanesthesia care unit in stable condition.           ______________________________ Sara Frock, MD     TM/MEDQ  D:  01/08/2016  T:  01/08/2016  Job:  [id number]

## 2016-01-08 NOTE — Anesthesia Procedure Notes (Signed)
Procedure Name: Intubation Date/Time: 01/08/2016 8:44 AM Performed by: Carleene Cooper A Pre-anesthesia Checklist: Patient identified, Emergency Drugs available, Patient being monitored, Timeout performed and Suction available Patient Re-evaluated:Patient Re-evaluated prior to inductionOxygen Delivery Method: Circle system utilized Preoxygenation: Pre-oxygenation with 100% oxygen Intubation Type: IV induction Ventilation: Mask ventilation without difficulty Laryngoscope Size: Glidescope and 3 Grade View: Grade I Tube type: Oral Tube size: 7.5 mm Number of attempts: 3 Airway Equipment and Method: Video-laryngoscopy Placement Confirmation: ETT inserted through vocal cords under direct vision,  positive ETCO2 and breath sounds checked- equal and bilateral Secured at: 21 cm Tube secured with: Tape Dental Injury: Teeth and Oropharynx as per pre-operative assessment  Difficulty Due To: Difficult Airway- due to anterior larynx and Difficult Airway- due to immobile epiglottis Comments: DL x 1 by CRNA with MAC4. Grade 2-3 view. [id number]. ETT removed, easy mask. DL X 1 with Mil 2 by MDA. Grade 1 view. Unable to pass ETT. Masked. VSS. Sats 100%. DL x 1 with Glidescope #3 by MDA. Grade 1 view. ATOI. BBS =.

## 2016-01-08 NOTE — Transfer of Care (Signed)
Immediate Anesthesia Transfer of Care Note  Patient: Sara [name]  Procedure(s) Performed: Procedure(s): XI ROBOTIC ASSITED PARTIAL NEPHRECTOMY (Right)  Patient Location: PACU  Anesthesia Type:General  Level of Consciousness: awake, alert , oriented and patient cooperative  Airway & Oxygen Therapy: Patient Spontanous Breathing and Patient connected to face mask oxygen  Post-op Assessment: Report given to RN, Post -op Vital signs reviewed and stable and Patient moving all extremities  Post vital signs: Reviewed and stable  Last Vitals:  Filed Vitals:   01/08/16 0645 01/08/16 1200  BP: 119/76 138/75  Pulse: 69 68  Temp: 36.4 C   Resp: 18     Complications: No apparent anesthesia complications

## 2016-01-08 NOTE — H&P (Signed)
Sara [name] is an 70 y.o. female.    Chief Complaint: Pre-op RIGHT Robotic Partial Nephrectomy  HPI:   1 - Right Renal Mass - 3cm RLP mass incidental on CT 11/2015 on eval RUQ pain. Mass is 3cm, 50% exophytic and lower pole medial anterior. 1 artery (early branch apears to supply lower 1/2 of kidney) 2 vein (lower pole accessory that gonadal inserts in) renovascular anatomy. No additional or contralateral lesions, she has at least right partial duplication. Cr <1.   2 - Right Renal Duplication - incidetnal Rt renal duplication with two ureters and some upper pole scarring by CT 11/2015. No imaging of ureters below iliacs to know if complete duplication. No h/o recurrent febrile UTI.  PMH sig for obesity, hypothyroid, HTN, Appy, Hyst (fibroids), Lumpectomy for breast cancer ([facility], follows Dr. Whitney Muse). Her PCP is [name] MD with [facility].  Today "Sara [name]" is seen to proceed with right robotic partial nephrectomy. Most recent Cr <1, Hgb >13.   Past Medical History  Diagnosis Date  . Pure hypercholesterolemia     takes Pravastin daily  . Allergic rhinitis due to pollen   . Cervicalgia   . Diverticulosis of colon (without mention of hemorrhage)   . Morbid obesity ([place])   . Palpitations   . Peptic ulcer, unspecified site, unspecified as acute or chronic, without mention of hemorrhage, perforation, or obstruction   . Shortness of breath   . H/O hiatal hernia   . HTN (hypertension)     takes Metoprolol and Lisinopril daily  . Hypertension   . GERD (gastroesophageal reflux disease)     takes Omeprazole daily  . Constipation     takes Miralax daily  . Insomnia     takes Ativan nigtly  . Other postablative hypothyroidism     takes SYnthroid daily  . Joint pain     "all of them"  . Joint swelling     "knees, legs, ankles sometimes" (11/20/2014)  . Graves' disease     "took a pill to correct"  . Headache(784.0)     denies migraines since the 80's but has occ and takes  Butalbital prn  . Cervical spondylosis without myelopathy     all over  . Degeneration of cervical intervertebral disc   . Primary localized osteoarthrosis, lower leg   . Arthritis     "knees, back, right elbow; left shoulder; right ankle" (11/20/2014)  . Chronic lower back pain   . Anxiety   . Depression   . Breast cancer, right breast ([place])     "DCIS; zero stage" S/P mastectomy 11/20/2014  . Tendonitis of knee     bilateral  . Numbness     LOWER LEGS  . Renal mass     RIGHT  . Recurrent UTI   . H/O urinary frequency   . Sleep apnea     slight but doesn't require a CPAP (11/20/2014)    Past Surgical History  Procedure Laterality Date  . Knee arthroscopy Right   . Appendectomy    . Colonoscopy  Sept 2008    RMR: normal rectum, left-sided diverticula, repeat in 2018  . Abdominal hysterectomy    . Upper gastrointestinal endoscopy    . Cataract extraction w/ intraocular lens  implant, bilateral Bilateral   . Lumpectomy on pelvis      "mass of nerves"  . Diagnostic laparoscopy    . Tubal ligation    . Esophagogastroduodenoscopy    . Anterior lat lumbar fusion  05/13/2012    Procedure: ANTERIOR LATERAL LUMBAR FUSION 2 LEVELS;  Surgeon: Faythe Ghee, MD;  Location: [facility];  Service: Neurosurgery;  Laterality: Left;  Left Lumbar Three-four,Lumbar four-five Extreme Lumbar Interbody Fusion with Percutaneous Pedicle Screws  . Knee arthroscopy with lateral menisectomy Right 10/06/2013    Procedure: KNEE ARTHROSCOPY WITH LATERAL AND MEDIAL MENISECTOMY;  Surgeon: Carole Civil, MD;  Location: [facility];  Service: Orthopedics;  Laterality: Right;  END @ 1234  . Foreign body removal Right 10/06/2013    Procedure: REMOVAL FOREIGN BODY EXTREMITY;  Surgeon: Carole Civil, MD;  Location: [facility];  Service: Orthopedics;  Laterality: Right;  . Injection knee Left 10/06/2013    Procedure: KNEE INJECTION;  Surgeon: Carole Civil, MD;  Location: [facility];  Service: Orthopedics;   Laterality: Left;  . Total knee arthroplasty Right 01/24/2014    Procedure: TOTAL KNEE ARTHROPLASTY;  Surgeon: Carole Civil, MD;  Location: [facility];  Service: Orthopedics;  Laterality: Right;  . Total knee arthroplasty Left 08/08/2014    Procedure: TOTAL KNEE ARTHROPLASTY;  Surgeon: Carole Civil, MD;  Location: [facility];  Service: Orthopedics;  Laterality: Left;  Marland Kitchen Mastectomy complete / simple w/ sentinel node biopsy Right 11/20/2014    axillary  . Breast reconstruction with placement of tissue expander and flex hd (acellular hydrated dermis) Right 11/20/2014  . Joint replacement    . Tonsillectomy    . Dilation and curettage of uterus    . Breast lumpectomy Left 1988  . Breast biopsy Left ~ 2014  . Breast biopsy Right 2015  . Simple mastectomy with axillary sentinel node biopsy Right 11/20/2014    Procedure: SIMPLE MASTECTOMY WITH AXILLARY SENTINEL NODE BIOPSY;  Surgeon: Erroll Luna, MD;  Location: [facility];  Service: General;  Laterality: Right;  . Breast reconstruction with placement of tissue expander and flex hd (acellular hydrated dermis) Right 11/20/2014    Procedure: RIGHT BREAST RECONSTRUCTION WITH PLACEMENT OF TISSUE EXPANDER AND ACELLULAR DERMA MATRIX ([place]);  Surgeon: Crissie Reese, MD;  Location: [facility];  Service: Plastics;  Laterality: Right;  . Tissue expander removal Right 12/13/2014    dr Harlow Mares  . Removal of tissue expander and placement of implant Right 12/13/2014    Procedure: REMOVAL OF RIGHT BREAST TISSUE EXPANDER;  Surgeon: Crissie Reese, MD;  Location: [facility];  Service: Plastics;  Laterality: Right;    Family History  Problem Relation Age of Onset  . Colon cancer Neg Hx     Mother died at 67 from blood clot, MI  . Heart attack      Father died in 81's from old age  . Heart attack      2 Brothers, 3 sisters  . Stroke Sister   . Osteoarthritis Sister   . Osteoarthritis Brother   . Obesity Brother   . Heart attack Sister    Social History:   reports that she quit smoking about 30 years ago. Her smoking use included Cigarettes. She has a 27 pack-year smoking history. She has never used smokeless tobacco. She reports that she drinks about 2.4 oz of alcohol per week. She reports that she does not use illicit drugs.  Allergies:  Allergies  Allergen Reactions  . Propoxyphene Hcl Other (See Comments)     Hallucinations  . Pollen Extract Other (See Comments)    Sneezing, congestion  . Camphor Itching  . Dust Mite Extract Itching    Sneezing. Runny nose   . Latex Dermatitis  and Rash  . Molds & Smuts Itching  . Tomato Hives    No prescriptions prior to admission    No results found for this or any previous visit (from the past 48 hour(s)). No results found.  Review of Systems  Constitutional: Negative.  Negative for fever.  HENT: Negative.   Eyes: Negative.   Respiratory: Negative.   Cardiovascular: Negative.   Gastrointestinal: Negative.  Negative for nausea and vomiting.  Genitourinary: Negative.  Negative for flank pain.  Musculoskeletal: Negative.   Skin: Negative.   Neurological: Negative.   Endo/Heme/Allergies: Negative.   Psychiatric/Behavioral: Negative.     There were no vitals taken for this visit. Physical Exam  Constitutional: She appears well-developed.  HENT:  Head: Normocephalic.  Eyes: Pupils are equal, round, and reactive to light.  Neck: Normal range of motion.  Cardiovascular: Normal rate.   Respiratory: Effort normal.  GI: Soft.  Moderate truncal obesity  Genitourinary:  No CVAT  Musculoskeletal: Normal range of motion.  Neurological: She is alert.  Skin: Skin is warm.  Psychiatric: She has a normal mood and affect. Her behavior is normal. Judgment and thought content normal.     Assessment/Plan   1 - Right Renal Mass - likely localized renal cell carcinoma. Size approx 3cm and well controlled comorbidity / excellent functional status favor curative intent therapy. Medial location  would make ablation problematic. Favor partial nephrectomy.  We rediscussed the role of partial nephrectomy with the overall goals being a balance of trying to achieve complete surgical excision (negative margins) while minimizing loss of normally functioning kidney. We then rediscussed surgical approaches including robotic and open techniques with robotic associated with a shorter convalescence. I showed the patient on their abdomen the approximately 4-6 incision (trocar) sites as well as presumed extraction sites with robotic approach as well as possible open incision sites. We specifically readdressed that there may be need to alter operative plans according to intraopertive findings including conversion to open procedure or conversion to radical nephrectomy as well as need for adjunctive procedures such as ureteral stenting to promote correct renal healing. We rediscussed specific peri-operative risks including bleeding, infection, deep vein thrombosis, pulmonary embolism, compartment syndrome, neuropathy / neuropraxia, heart attack, stroke, death, as well as long-term risks such as non-cure / need for additional therapy and need for imaging and lab based post-op surveillance protocols. We rediscussed typical hospital course of approximately 2 day hospitalization, need for peri-operative drains / catheters, and typical post-hospital course with return to most non-strenuous activities by 2 weeks and ability to return to most jobs and more strenuous activity such as exercise by 6 weeks.   She voiced understanding and desire to proceed today as planned.   2 - Right Renal Duplication - overall GFR excellent. May need double stents in future if has indicaiton for right ureteral stenting, otherwise no further eval or managment.   Alexis Frock, MD 01/08/2016, 6:15 AM

## 2016-01-09 LAB — HEMOGLOBIN AND HEMATOCRIT, BLOOD
HCT: 34.8 % — ABNORMAL LOW (ref 36.0–46.0)
Hemoglobin: 11.6 g/dL — ABNORMAL LOW (ref 12.0–15.0)

## 2016-01-09 LAB — BASIC METABOLIC PANEL
Anion gap: 6 (ref 5–15)
BUN: 6 mg/dL (ref 6–20)
CO2: 27 mmol/L (ref 22–32)
Calcium: 8.3 mg/dL — ABNORMAL LOW (ref 8.9–10.3)
Chloride: 102 mmol/L (ref 101–111)
Creatinine, Ser: 0.76 mg/dL (ref 0.44–1.00)
GFR calc Af Amer: >60 mL/min (ref >60)
GFR calc non Af Amer: >60 mL/min (ref >60)
Glucose, Bld: 135 mg/dL — ABNORMAL HIGH (ref 65–99)
Potassium: 4.4 mmol/L (ref 3.5–5.1)
Sodium: 135 mmol/L (ref 135–145)

## 2016-01-09 MED ORDER — SENNOSIDES-DOCUSATE SODIUM 8.6-50 MG PO TABS: 1.0000 | ORAL_TABLET | Freq: Two times a day (BID) | ORAL | Status: DC

## 2016-01-09 MED ORDER — HYDROCODONE-ACETAMINOPHEN 5-325 MG PO TABS
1.0000 | ORAL_TABLET | ORAL | Status: DC | PRN
  Administered 2016-01-09 (×2): 2 via ORAL
  Filled 2016-01-09 (×2): qty 2

## 2016-01-09 NOTE — Progress Notes (Signed)
Patient notify nurse at this time that she just realize  she was having allergic reaction to dilaudid, itching and swelling which have stopped. Offered to administer benadryl,  patient refuses but ask to add   Dilaudid in her allergies. Patient asymptomatic at this time.

## 2016-01-09 NOTE — Anesthesia Postprocedure Evaluation (Signed)
Anesthesia Post Note  Patient: Sara [name]  Procedure(s) Performed: Procedure(s) (LRB): XI ROBOTIC ASSITED PARTIAL NEPHRECTOMY (Right)  Patient location during evaluation: PACU Anesthesia Type: General Level of consciousness: awake and alert Pain management: pain level controlled Vital Signs Assessment: post-procedure vital signs reviewed and stable Respiratory status: spontaneous breathing, nonlabored ventilation, respiratory function stable and patient connected to nasal cannula oxygen Cardiovascular status: blood pressure returned to baseline and stable Postop Assessment: no signs of nausea or vomiting Anesthetic complications: no    Last Vitals:  Filed Vitals:   01/09/16 0146 01/09/16 0628  BP: 108/66 101/54  Pulse: 67 64  Temp: 36.4 C 36.3 C  Resp: 18 18    Last Pain:  Filed Vitals:   01/09/16 0655  PainSc: 0-No pain                 Merrell Rettinger,JAMES TERRILL

## 2016-01-09 NOTE — Discharge Summary (Signed)
Physician Discharge Summary  Patient ID: Sara [name] MRN: [id number] DOB/AGE: May 29, 1946 70 y.o.  Admit date: 01/08/2016 Discharge date: 01/09/2016  Admission Diagnoses:  Discharge Diagnoses:  Active Problems:   Renal mass   Discharged Condition: good  Hospital Course:   1 - Right Renal Mass - pt underwent right robotic partial nephrectomy on 4/19, the day of admission, without acute complications. She was admitted to 4th floor Urology service post-op where she began her vigorous recovery. By the afternoon of POD 1, the day of discharge, she is ambulatory, pain controlled with PO meds, maintaining PO intake, and felt to be adequate for discharge. JP removed prior to discharge as output scant after foley removal. Final pathology pending at discharge.   Consults: None  Significant Diagnostic Studies: labs: Cr 0.77, Hgb 11.6 at discharge.   Treatments: surgery: right robotic partial nephrectomy on 4/19,  Discharge Exam: Blood pressure 101/54, pulse 64, temperature 97.4 F (36.3 C), temperature source Oral, resp. rate 18, height 5\' 6"  (1.676 m), weight 102.059 kg (225 lb), SpO2 100 %. General appearance: alert, cooperative, appears stated age and husband at bedside Eyes: negative Nose: Nares normal. Septum midline. Mucosa normal. No drainage or sinus tenderness. Throat: lips, mucosa, and tongue normal; teeth and gums normal Neck: supple, symmetrical, trachea midline Back: symmetric, no curvature. ROM normal. No CVA tenderness. Resp: non-labored on room air Cardio: Nl rate GI: soft, non-tender; bowel sounds normal; no masses,  no organomegaly Extremities: extremities normal, atraumatic, no cyanosis or edema Pulses: 2+ and symmetric Lymph nodes: Cervical, supraclavicular, and axillary nodes normal. Neurologic: Grossly normal Incision/Wound: recent port sites c/d/i. JP removed and dry dressing applied.   Disposition: 01-Home or Self Care     Medication List    STOP  taking these medications        aspirin 325 MG EC tablet     CALCIUM-VITAMIN D PO     GARCINIA CAMBOGIA-CHROMIUM PO      TAKE these medications        acetaminophen 500 MG tablet  Commonly known as:  TYLENOL  Take 1,000 mg by mouth every 6 (six) hours as needed for moderate pain.     diphenhydrAMINE 25 MG tablet  Commonly known as:  SOMINEX  Take 50 mg by mouth at bedtime as needed for allergies or sleep.     fluticasone 50 MCG/ACT nasal spray  Commonly known as:  FLONASE  Place 2 sprays into the nose at bedtime.     gabapentin 600 MG tablet  Commonly known as:  NEURONTIN  1/2 tab during the day and one tab at bedtime     HYDROcodone-acetaminophen 5-325 MG tablet  Commonly known as:  NORCO/VICODIN  Take 1-2 tablets by mouth every 6 (six) hours as needed for moderate pain.     levothyroxine 88 MCG tablet  Commonly known as:  SYNTHROID, LEVOTHROID  Take 88 mcg by mouth daily before breakfast.     LORazepam 1 MG tablet  Commonly known as:  ATIVAN  Take 0.5 mg by mouth at bedtime as needed for sleep. For sleep     losartan-hydrochlorothiazide 100-12.5 MG tablet  Commonly known as:  HYZAAR  Take 1 tablet by mouth daily.     metoprolol tartrate 25 MG tablet  Commonly known as:  LOPRESSOR  Take 25 mg by mouth 2 (two) times daily.     omeprazole 40 MG capsule  Commonly known as:  PRILOSEC  Take 40 mg by mouth daily before breakfast.  oxybutynin 5 MG tablet  Commonly known as:  DITROPAN  Take 5 mg by mouth at bedtime.     pravastatin 40 MG tablet  Commonly known as:  PRAVACHOL  Take 40 mg by mouth every morning.     PRESCRIPTION MEDICATION  Apply 1 application topically as needed. Reported on 12/23/2015     senna-docusate 8.6-50 MG tablet  Commonly known as:  Senokot-S  Take 1 tablet by mouth 2 (two) times daily. While taking pain meds to prevent constipation     tamoxifen 20 MG tablet  Commonly known as:  NOLVADEX  Take 1 tablet by mouth  daily            Follow-up Information    Follow up with Alexis Frock, MD.   Specialty:  Urology   Why:  Office will call to schedule MD visit in a few weeks. Dr. Tresa Moore will call you with pathology results when available.    Contact information:   [place] [id number] [phone]       Signed: Alexis Frock 01/09/2016, 2:24 PM

## 2016-01-09 NOTE — Progress Notes (Signed)
1 Day Post-Op Subjective: The patient is doing well.  No nausea or vomiting. Pain is adequately controlled.    Objective: Vital signs in last 24 hours: Temp:  [94.5 F (34.7 C)-97.8 F (36.6 C)] 97.4 F (36.3 C) (04/20 0628) Pulse Rate:  [52-75] 64 (04/20 0628) Resp:  [6-18] 18 (04/20 0628) BP: (101-138)/(54-90) 101/54 mmHg (04/20 0628) SpO2:  [95 %-100 %] 100 % (04/20 0628) Weight:  [102.059 kg (225 lb)] 102.059 kg (225 lb) (04/19 1315)  Intake/Output from previous day: 04/19 0701 - 04/20 0700 In: 3145 [P.O.:160; I.V.:2925] Out: 2370 [Urine:2150; Drains:20; Blood:200] Intake/Output this shift:    Physical Exam:  General: Alert and oriented. CV: RRR Lungs: Clear bilaterally. GI: Soft, Nondistended. Incisions: Clean and dry. Urine: Clear Extremities: Nontender, no erythema, no edema.  Lab Results:  Recent Labs  01/08/16 1259 01/09/16 0503  HGB 13.7 11.6*  HCT 40.5 34.8*          Recent Labs  01/02/16 1120 01/09/16 0503  CREATININE 0.77 0.76           Results for orders placed or performed during the hospital encounter of 01/08/16 (from the past 24 hour(s))  Hemoglobin and hematocrit, blood     Status: None   Collection Time: 01/08/16 12:59 PM  Result Value Ref Range   Hemoglobin 13.7 12.0 - 15.0 g/dL   HCT 40.5 36.0 - [id number] %  Basic metabolic panel     Status: Abnormal   Collection Time: 01/09/16  5:03 AM  Result Value Ref Range   Sodium 135 135 - 145 mmol/L   Potassium 4.4 3.5 - 5.1 mmol/L   Chloride 102 101 - 111 mmol/L   CO2 27 22 - 32 mmol/L   Glucose, Bld 135 (H) 65 - 99 mg/dL   BUN 6 6 - 20 mg/dL   Creatinine, Ser 0.76 0.44 - 1.00 mg/dL   Calcium 8.3 (L) 8.9 - 10.3 mg/dL   GFR calc non Af Amer >60 >60 mL/min   GFR calc Af Amer >60 >60 mL/min   Anion gap 6 5 - 15  Hemoglobin and hematocrit, blood     Status: Abnormal   Collection Time: 01/09/16  5:03 AM  Result Value Ref Range   Hemoglobin 11.6 (L) 12.0 - 15.0 g/dL   HCT 34.8 (L) 36.0 -  46.0 %    Assessment/Plan: POD# 1 s/p right robotic partial nephrectomy. The patient complained of some diffuse pruritis related to the dilauded. This made the patient nervous and she felt like she was swelling, she was evaluated by the RN who noticed no swelling at the time. She described a period of anxiety related to the side effect of narcotics. Since she has taken many narcotics in the past without issue including the norco I transitioned her to, I removed Dilaudid from her allergy list.   1) Ambulate, Incentive spirometry 2) Advance diet as tolerated 3) Transition to oral pain medication 4) Dulcolax suppository 5) D/C urethral catheter    LOS: 1 day   Christell Faith 01/09/2016, 7:17 AM     I have seen and examined the patient and agree with above assesment and plan and as outlined in discharge summary that I authored on same date.

## 2016-01-22 ENCOUNTER — Encounter: Payer: Self-pay | Admitting: Orthopedic Surgery

## 2016-01-22 ENCOUNTER — Ambulatory Visit (INDEPENDENT_AMBULATORY_CARE_PROVIDER_SITE_OTHER): Payer: Medicare Other

## 2016-01-22 ENCOUNTER — Ambulatory Visit (INDEPENDENT_AMBULATORY_CARE_PROVIDER_SITE_OTHER): Payer: Medicare Other | Admitting: Orthopedic Surgery

## 2016-01-22 VITALS — BP 104/53 | Ht 66.0 in | Wt 225.0 lb

## 2016-01-22 DIAGNOSIS — Z96651 Presence of right artificial knee joint: Secondary | ICD-10-CM | POA: Diagnosis not present

## 2016-01-22 NOTE — Progress Notes (Signed)
Chief Complaint  Patient presents with  . Follow-up    annual follow up + xray Rt Tka, DOS 01/24/14   HPI - 70 year old female status post bilateral total knees here for right total knee x-ray and annual checkup 2 years postop  ROS -     Past Medical History  Diagnosis Date  . Pure hypercholesterolemia     takes Pravastin daily  . Allergic rhinitis due to pollen   . Cervicalgia   . Diverticulosis of colon (without mention of hemorrhage)   . Morbid obesity ([place])   . Palpitations   . Peptic ulcer, unspecified site, unspecified as acute or chronic, without mention of hemorrhage, perforation, or obstruction   . Shortness of breath   . H/O hiatal hernia   . HTN (hypertension)     takes Metoprolol and Lisinopril daily  . Hypertension   . GERD (gastroesophageal reflux disease)     takes Omeprazole daily  . Constipation     takes Miralax daily  . Insomnia     takes Ativan nigtly  . Other postablative hypothyroidism     takes SYnthroid daily  . Joint pain     "all of them"  . Joint swelling     "knees, legs, ankles sometimes" (11/20/2014)  . Graves' disease     "took a pill to correct"  . Headache(784.0)     denies migraines since the 80's but has occ and takes Butalbital prn  . Cervical spondylosis without myelopathy     all over  . Degeneration of cervical intervertebral disc   . Primary localized osteoarthrosis, lower leg   . Arthritis     "knees, back, right elbow; left shoulder; right ankle" (11/20/2014)  . Chronic lower back pain   . Anxiety   . Depression   . Breast cancer, right breast ([place])     "DCIS; zero stage" S/P mastectomy 11/20/2014  . Tendonitis of knee     bilateral  . Numbness     LOWER LEGS  . Renal mass     RIGHT  . Recurrent UTI   . H/O urinary frequency   . Sleep apnea     slight but doesn't require a CPAP (11/20/2014)    Social History  Substance Use Topics  . Smoking status: Former Smoker -- 1.00 packs/day for 27 years    Types: Cigarettes   Quit date: 12/26/1985  . Smokeless tobacco: Never Used  . Alcohol Use: 2.4 oz/week    4 Glasses of wine, 0 Standard drinks or equivalent per week     Comment: occasional wine    Current outpatient prescriptions:  .  acetaminophen (TYLENOL) 500 MG tablet, Take 1,000 mg by mouth every 6 (six) hours as needed for moderate pain. , Disp: , Rfl:  .  diphenhydrAMINE (SOMINEX) 25 MG tablet, Take 50 mg by mouth at bedtime as needed for allergies or sleep., Disp: , Rfl:  .  fluticasone (FLONASE) 50 MCG/ACT nasal spray, Place 2 sprays into the nose at bedtime. , Disp: , Rfl:  .  gabapentin (NEURONTIN) 600 MG tablet, 1/2 tab during the day and one tab at bedtime (Patient taking differently: Take 300-600 mg by mouth 2 (two) times daily. 1/2 tab during the day and one tab at bedtime), Disp: 180 tablet, Rfl: 3 .  HYDROcodone-acetaminophen (NORCO/VICODIN) 5-325 MG tablet, Take 1-2 tablets by mouth every 6 (six) hours as needed for moderate pain., Disp: 30 tablet, Rfl: 0 .  levothyroxine (SYNTHROID, LEVOTHROID) 88 MCG tablet, Take  88 mcg by mouth daily before breakfast. , Disp: , Rfl:  .  LORazepam (ATIVAN) 1 MG tablet, Take 0.5 mg by mouth at bedtime as needed for sleep. For sleep, Disp: , Rfl:  .  losartan-hydrochlorothiazide (HYZAAR) 100-12.5 MG per tablet, Take 1 tablet by mouth daily. , Disp: , Rfl:  .  metoprolol tartrate (LOPRESSOR) 25 MG tablet, Take 25 mg by mouth 2 (two) times daily. , Disp: , Rfl:  .  omeprazole (PRILOSEC) 40 MG capsule, Take 40 mg by mouth daily before breakfast. , Disp: , Rfl:  .  oxybutynin (DITROPAN) 5 MG tablet, Take 5 mg by mouth at bedtime. , Disp: , Rfl:  .  pravastatin (PRAVACHOL) 40 MG tablet, Take 40 mg by mouth every morning. , Disp: , Rfl:  .  PRESCRIPTION MEDICATION, Apply 1 application topically as needed. Reported on 12/23/2015, Disp: , Rfl:  .  senna-docusate (SENOKOT-S) 8.6-50 MG tablet, Take 1 tablet by mouth 2 (two) times daily. While taking pain meds to prevent  constipation, Disp: 30 tablet, Rfl: 0 .  tamoxifen (NOLVADEX) 20 MG tablet, Take 1 tablet by mouth  daily, Disp: 90 tablet, Rfl: 3 .  [DISCONTINUED] Ranitidine HCl (ZANTAC 75 PO), Take by mouth.  , Disp: , Rfl:   BP 104/53 mmHg  Ht 5\' 6"  (1.676 m)  Wt 225 lb (102.059 kg)  BMI 36.33 kg/m2  Physical Exam  Constitutional: She is oriented to person, place, and time. She appears well-developed and well-nourished. No distress.  Cardiovascular: Normal rate and intact distal pulses.   Neurological: She is alert and oriented to person, place, and time. She has normal reflexes. She exhibits normal muscle tone. Coordination normal.  Skin: Skin is warm and dry. No rash noted. She is not diaphoretic. No erythema. No pallor.  Psychiatric: She has a normal mood and affect. Her behavior is normal. Judgment and thought content normal.    Ortho Exam She is able to walk without support and there is no noticeable limp  Right knee anterior incision healed no complications. Knee extension 0. Knee flexion 115. No instability front to back or side to side Quadriceps strength is normal No peripheral edema  Left knee  anterior incision healed no complications. Knee extension 0. Knee flexion 115. No instability front to back or side to side Quadriceps strength is normal No peripheral edema  ASSESSMENT: My personal interpretation of the images:  Radiology report  3 views RIGHT knee  AP lateral and patellar sunrise views of the knee  FINDINGS: A total knee prosthesis is noted. It is in anatomic alignment. There is no evidence of loosening.  Impression : normal TKA  xray   PLAN X-ray left knee in November

## 2016-01-23 ENCOUNTER — Encounter: Payer: Self-pay | Admitting: Genetic Counselor

## 2016-01-28 ENCOUNTER — Ambulatory Visit: Payer: Medicare Other | Admitting: Orthopedic Surgery

## 2016-01-30 ENCOUNTER — Encounter (HOSPITAL_BASED_OUTPATIENT_CLINIC_OR_DEPARTMENT_OTHER): Payer: Medicare Other | Admitting: Hematology & Oncology

## 2016-01-30 ENCOUNTER — Encounter: Payer: Self-pay | Admitting: Genetic Counselor

## 2016-01-30 ENCOUNTER — Encounter: Payer: Self-pay | Admitting: Internal Medicine

## 2016-01-30 ENCOUNTER — Encounter (HOSPITAL_COMMUNITY): Payer: Self-pay | Admitting: Genetic Counselor

## 2016-01-30 ENCOUNTER — Encounter (HOSPITAL_COMMUNITY): Payer: Medicare Other

## 2016-01-30 ENCOUNTER — Encounter (HOSPITAL_COMMUNITY): Payer: Self-pay | Admitting: Hematology & Oncology

## 2016-01-30 ENCOUNTER — Encounter (HOSPITAL_COMMUNITY): Payer: Medicare Other | Attending: Genetic Counselor | Admitting: Genetic Counselor

## 2016-01-30 VITALS — BP 130/78 | HR 70 | Temp 98.3°F | Resp 18 | Wt 224.0 lb

## 2016-01-30 DIAGNOSIS — N2889 Other specified disorders of kidney and ureter: Secondary | ICD-10-CM

## 2016-01-30 DIAGNOSIS — K760 Fatty (change of) liver, not elsewhere classified: Secondary | ICD-10-CM

## 2016-01-30 DIAGNOSIS — C641 Malignant neoplasm of right kidney, except renal pelvis: Secondary | ICD-10-CM

## 2016-01-30 DIAGNOSIS — D0511 Intraductal carcinoma in situ of right breast: Secondary | ICD-10-CM

## 2016-01-30 DIAGNOSIS — C649 Malignant neoplasm of unspecified kidney, except renal pelvis: Secondary | ICD-10-CM

## 2016-01-30 DIAGNOSIS — D0501 Lobular carcinoma in situ of right breast: Secondary | ICD-10-CM

## 2016-01-30 DIAGNOSIS — C50411 Malignant neoplasm of upper-outer quadrant of right female breast: Secondary | ICD-10-CM

## 2016-01-30 DIAGNOSIS — I890 Lymphedema, not elsewhere classified: Secondary | ICD-10-CM

## 2016-01-30 DIAGNOSIS — Z315 Encounter for genetic counseling: Secondary | ICD-10-CM

## 2016-01-30 DIAGNOSIS — Z800 Family history of malignant neoplasm of digestive organs: Secondary | ICD-10-CM

## 2016-01-30 DIAGNOSIS — Z801 Family history of malignant neoplasm of trachea, bronchus and lung: Secondary | ICD-10-CM

## 2016-01-30 LAB — CBC WITH DIFFERENTIAL/PLATELET
Basophils Absolute: 0 10*3/uL (ref 0.0–0.1)
Basophils Relative: 1 %
Eosinophils Absolute: 0.4 10*3/uL (ref 0.0–0.7)
Eosinophils Relative: 6 %
HCT: 37.2 % (ref 36.0–46.0)
Hemoglobin: 12.4 g/dL (ref 12.0–15.0)
Lymphocytes Relative: 44 %
Lymphs Abs: 2.7 10*3/uL (ref 0.7–4.0)
MCH: 31.4 pg (ref 26.0–34.0)
MCHC: 33.3 g/dL (ref 30.0–36.0)
MCV: 94.2 fL (ref 78.0–100.0)
Monocytes Absolute: 0.4 10*3/uL (ref 0.1–1.0)
Monocytes Relative: 7 %
Neutro Abs: 2.5 10*3/uL (ref 1.7–7.7)
Neutrophils Relative %: 42 %
Platelets: 259 10*3/uL (ref 150–400)
RBC: 3.95 MIL/uL (ref 3.87–5.11)
RDW: 13.3 % (ref 11.5–15.5)
WBC: 6.1 10*3/uL (ref 4.0–10.5)

## 2016-01-30 LAB — COMPREHENSIVE METABOLIC PANEL
ALT: 48 U/L (ref 14–54)
AST: 37 U/L (ref 15–41)
Albumin: 3.7 g/dL (ref 3.5–5.0)
Alkaline Phosphatase: 47 U/L (ref 38–126)
Anion gap: 7 (ref 5–15)
BUN: 8 mg/dL (ref 6–20)
CO2: 26 mmol/L (ref 22–32)
Calcium: 8.7 mg/dL — ABNORMAL LOW (ref 8.9–10.3)
Chloride: 105 mmol/L (ref 101–111)
Creatinine, Ser: 0.8 mg/dL (ref 0.44–1.00)
GFR calc Af Amer: >60 mL/min (ref >60)
GFR calc non Af Amer: >60 mL/min (ref >60)
Glucose, Bld: 97 mg/dL (ref 65–99)
Potassium: 3.7 mmol/L (ref 3.5–5.1)
Sodium: 138 mmol/L (ref 135–145)
Total Bilirubin: 0.6 mg/dL (ref 0.3–1.2)
Total Protein: 7.1 g/dL (ref 6.5–8.1)

## 2016-01-30 NOTE — Progress Notes (Signed)
REFERRING PROVIDER: [name], MD [address] [place], [place] [address]   Ancil Linsey, MD  PRIMARY PROVIDER:  Maggie Font, MD  PRIMARY REASON FOR VISIT:  1. Malignant neoplasm of upper-outer quadrant of right female breast ([place])   2. Right kidney mass      HISTORY OF PRESENT ILLNESS:   [name], a 70 y.o. female, was seen for a  [facility] cancer genetics consultation at the request of Dr. Whitney Muse due to a personal history of cancer.  Sara [name] presents to clinic today to discuss the possibility of a hereditary predisposition to cancer, genetic testing, and to further clarify her future cancer risks, as well as potential cancer risks for family members.   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 The tumor was ER+/PR+.  This was treated with right mastectomy and tamoxifen.  In March 2017, Sara [name] was diagnosed with right clear cell renal cell carcinoma.  This was treated with right nephrectomy.  No further treatment was needed.     CANCER HISTORY:    Malignant neoplasm of upper outer quadrant of female breast ([place])   09/05/2014 Imaging assymetry noted in R breast on screening mammogram   09/18/2014 Imaging Irregular mass in the 12 o'clock location of the right breast. Tissue diagnosis is recommended.   09/18/2014 Imaging IMPRESSION: Irregular mass in the 12 o'clock location of the right breast. Tissue diagnosis is recommended.   10/09/2014 Imaging 8 x 12 x 8 CM abnormal linear clumped enhancement throughout the majority of the central and upper right breast.   10/17/2014 Initial Biopsy Breast, right, needle core biopsy, central - DUCTAL CARCINOMA IN SITU, MIXED PATTERNS (CRIBRIFORM, COMEDO AND PAPILLARY), INVOLVING MULTIPLE CORES, AT LEAST 9 MM IN MAXIMAL EXTENT IN ANY ONE CORE. - FOCAL LOBULAR NEOPLASIA (ALH/LCIS). - NO INVASIVE CAR   11/20/2014 Definitive Surgery Breast, simple mastectomy, right - DUCTAL CARCINOMA IN SITU, SEE  COMMENT. - LOBULAR CARCINOMA IN SITU (CONVENTIONAL AND PLEOMORPHIC TYPES). - PREVIOUS BIOPSY SITES (X2). - IN SITU CARCINOMA IS 3CM FROM NEAREST MARGIN (DEEP).   01/01/2015 -  Anti-estrogen oral therapy Tamoxifen daily.     HORMONAL RISK FACTORS:  Menarche was at age 15.  First live birth at age 51.  OCP use for approximately 0 and 1 years.  Ovaries intact: yes.  Hysterectomy: yes.  Menopausal status: postmenopausal.  HRT use: 0 years. Colonoscopy: yes; normal. Mammogram within the last year: yes. Number of breast biopsies: 1. Up to date with pelvic exams:  yes. Any excessive radiation exposure in the past:  No, but has had 2 abdominal CT scans looking at her kidney mass  Past Medical History  Diagnosis Date  . Pure hypercholesterolemia     takes Pravastin daily  . Allergic rhinitis due to pollen   . Cervicalgia   . Diverticulosis of colon (without mention of hemorrhage)   . Morbid obesity ([place])   . Palpitations   . Peptic ulcer, unspecified site, unspecified as acute or chronic, without mention of hemorrhage, perforation, or obstruction   . Shortness of breath   . H/O hiatal hernia   . HTN (hypertension)     takes Metoprolol and Lisinopril daily  . Hypertension   . GERD (gastroesophageal reflux disease)     takes Omeprazole daily  . Constipation     takes Miralax daily  . Insomnia     takes Ativan nigtly  . Other postablative hypothyroidism  takes SYnthroid daily  . Joint pain     "all of them"  . Joint swelling     "knees, legs, ankles sometimes" (11/20/2014)  . Graves' disease     "took a pill to correct"  . Headache(784.0)     denies migraines since the 80's but has occ and takes Butalbital prn  . Cervical spondylosis without myelopathy     all over  . Degeneration of cervical intervertebral disc   . Primary localized osteoarthrosis, lower leg   . Arthritis     "knees, back, right elbow; left shoulder; right ankle" (11/20/2014)  . Chronic lower back pain   .  Anxiety   . Depression   . Breast cancer, right breast ([place])     "DCIS; zero stage" S/P mastectomy 11/20/2014  . Tendonitis of knee     bilateral  . Numbness     LOWER LEGS  . Renal mass     RIGHT  . Recurrent UTI   . H/O urinary frequency   . Sleep apnea     slight but doesn't require a CPAP (11/20/2014)    Past Surgical History  Procedure Laterality Date  . Knee arthroscopy Right   . Appendectomy    . Colonoscopy  Sept 2008    RMR: normal rectum, left-sided diverticula, repeat in 2018  . Abdominal hysterectomy    . Upper gastrointestinal endoscopy    . Cataract extraction w/ intraocular lens  implant, bilateral Bilateral   . Lumpectomy on pelvis      "mass of nerves"  . Diagnostic laparoscopy    . Tubal ligation    . Esophagogastroduodenoscopy    . Anterior lat lumbar fusion  05/13/2012    Procedure: ANTERIOR LATERAL LUMBAR FUSION 2 LEVELS;  Surgeon: Faythe Ghee, MD;  Location: [facility];  Service: Neurosurgery;  Laterality: Left;  Left Lumbar Three-four,Lumbar four-five Extreme Lumbar Interbody Fusion with Percutaneous Pedicle Screws  . Knee arthroscopy with lateral menisectomy Right 10/06/2013    Procedure: KNEE ARTHROSCOPY WITH LATERAL AND MEDIAL MENISECTOMY;  Surgeon: Carole Civil, MD;  Location: [facility];  Service: Orthopedics;  Laterality: Right;  END @ 1234  . Foreign body removal Right 10/06/2013    Procedure: REMOVAL FOREIGN BODY EXTREMITY;  Surgeon: Carole Civil, MD;  Location: [facility];  Service: Orthopedics;  Laterality: Right;  . Injection knee Left 10/06/2013    Procedure: KNEE INJECTION;  Surgeon: Carole Civil, MD;  Location: [facility];  Service: Orthopedics;  Laterality: Left;  . Total knee arthroplasty Right 01/24/2014    Procedure: TOTAL KNEE ARTHROPLASTY;  Surgeon: Carole Civil, MD;  Location: [facility];  Service: Orthopedics;  Laterality: Right;  . Total knee arthroplasty Left 08/08/2014    Procedure: TOTAL KNEE ARTHROPLASTY;  Surgeon: Carole Civil, MD;  Location: [facility];  Service: Orthopedics;  Laterality: Left;  Marland Kitchen Mastectomy complete / simple w/ sentinel node biopsy Right 11/20/2014    axillary  . Breast reconstruction with placement of tissue expander and flex hd (acellular hydrated dermis) Right 11/20/2014  . Joint replacement    . Tonsillectomy    . Dilation and curettage of uterus    . Breast lumpectomy Left 1988  . Breast biopsy Left ~ 2014  . Breast biopsy Right 2015  . Simple mastectomy with axillary sentinel node biopsy Right 11/20/2014    Procedure: SIMPLE MASTECTOMY WITH AXILLARY SENTINEL NODE BIOPSY;  Surgeon: Erroll Luna, MD;  Location: [facility];  Service: General;  Laterality: Right;  .  Breast reconstruction with placement of tissue expander and flex hd (acellular hydrated dermis) Right 11/20/2014    Procedure: RIGHT BREAST RECONSTRUCTION WITH PLACEMENT OF TISSUE EXPANDER AND ACELLULAR DERMA MATRIX ([place]);  Surgeon: Crissie Reese, MD;  Location: [facility];  Service: Plastics;  Laterality: Right;  . Tissue expander removal Right 12/13/2014    dr Harlow Mares  . Removal of tissue expander and placement of implant Right 12/13/2014    Procedure: REMOVAL OF RIGHT BREAST TISSUE EXPANDER;  Surgeon: Crissie Reese, MD;  Location: [facility];  Service: Plastics;  Laterality: Right;  . Robotic assited partial nephrectomy Right 01/08/2016    Procedure: XI ROBOTIC ASSITED PARTIAL NEPHRECTOMY;  Surgeon: Alexis Frock, MD;  Location: [facility];  Service: Urology;  Laterality: Right;    Social History   Social History  . Marital Status: Married    Spouse Name: Milbert Coulter  . Number of Children: 4  . Years of Education: N/A   Occupational History  . disability     since 2008, knee surgery   Social History Main Topics  . Smoking status: Former Smoker -- 1.00 packs/day for 27 years    Types: Cigarettes    Quit date: 12/26/1985  . Smokeless tobacco: Never Used  . Alcohol Use: 2.4 oz/week    4 Glasses of wine, 0 Standard drinks or  equivalent per week     Comment: occasional wine  . Drug Use: No  . Sexual Activity: Yes    Birth Control/ Protection: Surgical   Other Topics Concern  . None   Social History Narrative   Currently married for 10 years. Husband's name is Milbert Coulter. She is an excellent singer. 8 children between she and her husband. 23 grand children. 12 great grandchildren.     FAMILY HISTORY:  We obtained a detailed, 4-generation family history.  Significant diagnoses are listed below: Family History  Problem Relation Age of Onset  . Colon cancer Neg Hx     Mother died at 74 from blood clot, MI  . Heart attack      2 Brothers, 3 sisters/Father died in 15's from old age  . Stroke Sister   . Osteoarthritis Sister   . Osteoarthritis Brother   . Obesity Brother   . Heart attack Sister   . Heart attack Father   . Lung cancer Maternal Grandmother     non-smoker    The patient has four children.  One son had a stroke three years ago, and another son was diagnosed with MS.  She has three sisters and two brothers, who are all cancer free.  The patient's mother died at 30 of a blood clot and her father died at 31 from a heart attack.  Her mother had five sisters and two brothers, none who had cancer.  Her maternal grandmother was a non-smoker and died of lung cancer.  Her grandfather died of old age.  The patient's father had many siblings, one brother is still living.  No family history of cancer was reported.  Patient's maternal ancestors are of [place] American and [place] descent, and paternal ancestors are of [place] American and Native American descent. There is no reported Ashkenazi Jewish ancestry. There is no known consanguinity.  GENETIC COUNSELING ASSESSMENT: Marqita Becerril Heinert is a 70 y.o. female with a personal history of DCIS breast cancer and right kidney cancer which is somewhat suggestive of a sporadic cancer. We, therefore, discussed and recommended the following at today's visit.   DISCUSSION:  We discussed that  on average, 5-10% of breast cancer and 5% of kidney cancer is due to hereditary cancer syndromes.  Therefore, most cancers are sporadic, and do not have an inherited cause.  We reviewed the characteristics, features and inheritance patterns of hereditary cancer syndromes. This includes multiple generations of cancer, related cancers in family members, multiple cancers in one person and diagnoses at young ages (typically under 6).  We discussed with Ms. Antill that her family history is not highly consistent with a familial hereditary cancer syndrome, and we feel she is at low risk to harbor a gene mutation associated with such a condition. Thus, we did not recommend any genetic testing, at this time, and recommended Ms. Beine continue to follow the cancer screening guidelines given by her primary healthcare provider.  We discussed that the average woman has a lifetime risk for breast cancer of about 12.5%.  Women who have a family history of breast cancer can have a higher risk for developing breast cancer.  Therefore, Ms. Majid's daughters will have a slightly higher risk for developing breast cancer based on their family history.  However, that risk is not high enough to warrant a change in their management.  PLAN: We encouraged Ms. Meiser to remain in contact with cancer genetics annually so that we can continuously update the family history and inform her of any changes in cancer genetics and testing that may be of benefit for this family. If Ms. Tolleson learns of additional family history that may change our decision about genetic testing, please let us know.  We would be happy to re-evaluate her family history if there is a change from what we saw today.  Ms.  Hammerle questions were answered to her satisfaction today. Our contact information was provided should additional questions or concerns arise. Thank you for the referral and allowing [place] to share in the care of your  patient.   Athalene Kolle P. Florene Glen, [place], [facility] Certified Genetic Counselor [email or web address] phone: [phone]  The patient was seen for a total of 30 minutes in face-to-face genetic counseling.  This patient was discussed with Drs. Magrinat, Lindi Adie and/or Burr Medico who agrees with the above.    _______________________________________________________________________ For Office Staff:  Number of people involved in session: 2 Was an Intern/ student involved with case: no

## 2016-01-30 NOTE — Patient Instructions (Signed)
[place] at [facility]  Discharge Instructions:  Seen and evaluated by Dr. Whitney Muse today.   Follow up with Gastrointestinal MD Sydell Axon for your fatty liver.  Follow-up appt with labs every 4 months.   Read your book on Kidney Cancer Stage I and the follow up that is needed.    _______________________________________________________________  Thank you for choosing [place] at [facility] to provide your oncology and hematology care.  To afford each patient quality time with our providers, please arrive at least 15 minutes before your scheduled appointment.  You need to re-schedule your appointment if you arrive 10 or more minutes late.  We strive to give you quality time with our providers, and arriving late affects you and other patients whose appointments are after yours.  Also, if you no show three or more times for appointments you may be dismissed from the clinic.  Again, thank you for choosing [place] at [facility] hope is that these requests will allow you access to exceptional care and in a timely manner. _______________________________________________________________  If you have questions after your visit, please contact our office at [phone] between the hours of 8:30 a.m. and 5:00 p.m. Voicemails left after 4:30 p.m. will not be returned until the following business day. _______________________________________________________________  For prescription refill requests, have your pharmacy contact our office. _______________________________________________________________  Recommendations made by the consultant and any test results will be sent to your referring physician. _______________________________________________________________

## 2016-01-30 NOTE — Progress Notes (Signed)
[place] PROGRESS NOTE  Patient Care Team: [name], MD as PCP - General (Family Medicine) Daneil Dolin, MD (Gastroenterology) Josue Hector, MD as Attending Physician (Cardiology)  CHIEF COMPLAINTS/PURPOSE OF CONSULTATION:  DCIS R breast, DUCTAL CARCINOMA IN SITU, MIXED PATTERNS (CRIBRIFORM, COMEDO AND PAPILLARY), INVOLVING MULTIPLE CORES, AT LEAST 9 MM IN MAXIMAL EXTENT IN ANY ONE CORE.  Right Simple mastectomy with axillary sentinel node biopsy with reconstruction and placement of tissue expander 11/20/2014  TNM: pTis, pN0. Ultimate size of tumor, 10 cm. DCIS and LCIS. 7 sentinel nodes removed all negative for disease  Tamoxifen for chemoprevention started in 2016 Robotic assisted laparoscopic R partial nephrectomy on 01/08/2016 with Dr. Tresa Moore. Renal Cell Carcinoma, clear cell type, nuclear grade 2 (2.4 cm) pT1a,pNx  HISTORY OF PRESENTING ILLNESS Sara [name] is a 70 y.o. female who underwent  screening mammography. Asymmetry was reported in the right breast.. The left breast was negative. Ultrasound of this area showed a irrgeular mass in the 12 o'clock location measuring 0.7 x 0.5 x 0.4 cm. Biopsy was performed and showed DCIS with focal necrosis. ER and PR were positive.   A breast MRI showed an 8 x 12 x 8 cm area of abnormal linear clumped enhancement throughout the majority of the central and upper right breast.   She underwent a simple mastectomy of the right breast and sentinel node procedure. She started but opted not to complete reconstruction.  She takes tamoxifen daily for chemoprevention..   Sara [name] is accompanied by her husband.  Her recent surgery went very well. She only spent one night in the hospital. She was given her pathology report.  She said that her mood is good most of the time. Some days she gets frustrated. She sees her Urologist on Tuesday.   She notes she is still interested in seeing Dr. Sydell Axon for her hepatic  steatosis.   MEDICAL HISTORY:  Past Medical History  Diagnosis Date  . Pure hypercholesterolemia     takes Pravastin daily  . Allergic rhinitis due to pollen   . Cervicalgia   . Diverticulosis of colon (without mention of hemorrhage)   . Morbid obesity ([place])   . Palpitations   . Peptic ulcer, unspecified site, unspecified as acute or chronic, without mention of hemorrhage, perforation, or obstruction   . Shortness of breath   . H/O hiatal hernia   . HTN (hypertension)     takes Metoprolol and Lisinopril daily  . Hypertension   . GERD (gastroesophageal reflux disease)     takes Omeprazole daily  . Constipation     takes Miralax daily  . Insomnia     takes Ativan nigtly  . Other postablative hypothyroidism     takes SYnthroid daily  . Joint pain     "all of them"  . Joint swelling     "knees, legs, ankles sometimes" (11/20/2014)  . Graves' disease     "took a pill to correct"  . Headache(784.0)     denies migraines since the 80's but has occ and takes Butalbital prn  . Cervical spondylosis without myelopathy     all over  . Degeneration of cervical intervertebral disc   . Primary localized osteoarthrosis, lower leg   . Arthritis     "knees, back, right elbow; left shoulder; right ankle" (11/20/2014)  . Chronic lower back pain   . Anxiety   . Depression   . Breast cancer, right breast ([place])     "  DCIS; zero stage" S/P mastectomy 11/20/2014  . Tendonitis of knee     bilateral  . Numbness     LOWER LEGS  . Renal mass     RIGHT  . Recurrent UTI   . H/O urinary frequency   . Sleep apnea     slight but doesn't require a CPAP (11/20/2014)    SURGICAL HISTORY: Past Surgical History  Procedure Laterality Date  . Knee arthroscopy Right   . Appendectomy    . Colonoscopy  Sept 2008    RMR: normal rectum, left-sided diverticula, repeat in 2018  . Abdominal hysterectomy    . Upper gastrointestinal endoscopy    . Cataract extraction w/ intraocular lens  implant, bilateral  Bilateral   . Lumpectomy on pelvis      "mass of nerves"  . Diagnostic laparoscopy    . Tubal ligation    . Esophagogastroduodenoscopy    . Anterior lat lumbar fusion  05/13/2012    Procedure: ANTERIOR LATERAL LUMBAR FUSION 2 LEVELS;  Surgeon: Faythe Ghee, MD;  Location: [facility];  Service: Neurosurgery;  Laterality: Left;  Left Lumbar Three-four,Lumbar four-five Extreme Lumbar Interbody Fusion with Percutaneous Pedicle Screws  . Knee arthroscopy with lateral menisectomy Right 10/06/2013    Procedure: KNEE ARTHROSCOPY WITH LATERAL AND MEDIAL MENISECTOMY;  Surgeon: Carole Civil, MD;  Location: [facility];  Service: Orthopedics;  Laterality: Right;  END @ 1234  . Foreign body removal Right 10/06/2013    Procedure: REMOVAL FOREIGN BODY EXTREMITY;  Surgeon: Carole Civil, MD;  Location: [facility];  Service: Orthopedics;  Laterality: Right;  . Injection knee Left 10/06/2013    Procedure: KNEE INJECTION;  Surgeon: Carole Civil, MD;  Location: [facility];  Service: Orthopedics;  Laterality: Left;  . Total knee arthroplasty Right 01/24/2014    Procedure: TOTAL KNEE ARTHROPLASTY;  Surgeon: Carole Civil, MD;  Location: [facility];  Service: Orthopedics;  Laterality: Right;  . Total knee arthroplasty Left 08/08/2014    Procedure: TOTAL KNEE ARTHROPLASTY;  Surgeon: Carole Civil, MD;  Location: [facility];  Service: Orthopedics;  Laterality: Left;  Marland Kitchen Mastectomy complete / simple w/ sentinel node biopsy Right 11/20/2014    axillary  . Breast reconstruction with placement of tissue expander and flex hd (acellular hydrated dermis) Right 11/20/2014  . Joint replacement    . Tonsillectomy    . Dilation and curettage of uterus    . Breast lumpectomy Left 1988  . Breast biopsy Left ~ 2014  . Breast biopsy Right 2015  . Simple mastectomy with axillary sentinel node biopsy Right 11/20/2014    Procedure: SIMPLE MASTECTOMY WITH AXILLARY SENTINEL NODE BIOPSY;  Surgeon: Erroll Luna, MD;  Location: [facility];   Service: General;  Laterality: Right;  . Breast reconstruction with placement of tissue expander and flex hd (acellular hydrated dermis) Right 11/20/2014    Procedure: RIGHT BREAST RECONSTRUCTION WITH PLACEMENT OF TISSUE EXPANDER AND ACELLULAR DERMA MATRIX ([place]);  Surgeon: Crissie Reese, MD;  Location: [facility];  Service: Plastics;  Laterality: Right;  . Tissue expander removal Right 12/13/2014    dr Harlow Mares  . Removal of tissue expander and placement of implant Right 12/13/2014    Procedure: REMOVAL OF RIGHT BREAST TISSUE EXPANDER;  Surgeon: Crissie Reese, MD;  Location: [facility];  Service: Plastics;  Laterality: Right;  . Robotic assited partial nephrectomy Right 01/08/2016    Procedure: XI ROBOTIC ASSITED PARTIAL NEPHRECTOMY;  Surgeon: Alexis Frock, MD;  Location: [facility];  Service: Urology;  Laterality: Right;    SOCIAL HISTORY: Social History   Social History  . Marital Status: Married    Spouse Name: N/A  . Number of Children: N/A  . Years of Education: N/A   Occupational History  . disability     since 2008, knee surgery   Social History Main Topics  . Smoking status: Former Smoker -- 1.00 packs/day for 27 years    Types: Cigarettes    Quit date: 12/26/1985  . Smokeless tobacco: Never Used  . Alcohol Use: 2.4 oz/week    4 Glasses of wine, 0 Standard drinks or equivalent per week     Comment: occasional wine  . Drug Use: No  . Sexual Activity: Yes    Birth Control/ Protection: Surgical   Other Topics Concern  . Not on file   Social History Narrative   Currently married for 10 years. Husband's name is Milbert Coulter. She is an excellent singer. 8 children between she and her husband. 23 grand children. 12 great grandchildren.    FAMILY HISTORY: Family History  Problem Relation Age of Onset  . Colon cancer Neg Hx     Mother died at 70 from blood clot, MI  . Heart attack      Father died in 83's from old age  . Heart attack      2 Brothers, 3 sisters  . Stroke  Sister   . Osteoarthritis Sister   . Osteoarthritis Brother   . Obesity Brother   . Heart attack Sister    has no family status information on file.   ALLERGIES:  is allergic to propoxyphene hcl; pollen extract; camphor; dust mite extract; latex; molds & smuts; and tomato.  MEDICATIONS:  Current Outpatient Prescriptions  Medication Sig Dispense Refill  . acetaminophen (TYLENOL) 500 MG tablet Take 1,000 mg by mouth every 6 (six) hours as needed for moderate pain.     . diphenhydrAMINE (SOMINEX) 25 MG tablet Take 50 mg by mouth at bedtime as needed for allergies or sleep.    . fluticasone (FLONASE) 50 MCG/ACT nasal spray Place 2 sprays into the nose at bedtime.     . gabapentin (NEURONTIN) 600 MG tablet 1/2 tab during the day and one tab at bedtime (Patient taking differently: Take 300-600 mg by mouth 2 (two) times daily. 1/2 tab during the day and one tab at bedtime) 180 tablet 3  . levothyroxine (SYNTHROID, LEVOTHROID) 88 MCG tablet Take 88 mcg by mouth daily before breakfast.     . LORazepam (ATIVAN) 1 MG tablet Take 0.5 mg by mouth at bedtime as needed for sleep. For sleep    . losartan-hydrochlorothiazide (HYZAAR) 100-12.5 MG per tablet Take 1 tablet by mouth daily.     . metoprolol tartrate (LOPRESSOR) 25 MG tablet Take 25 mg by mouth 2 (two) times daily.     Marland Kitchen omeprazole (PRILOSEC) 40 MG capsule Take 40 mg by mouth daily before breakfast.     . oxybutynin (DITROPAN) 5 MG tablet Take 5 mg by mouth at bedtime. Reported on 01/30/2016    . pravastatin (PRAVACHOL) 40 MG tablet Take 40 mg by mouth every morning.     . senna-docusate (SENOKOT-S) 8.6-50 MG tablet Take 1 tablet by mouth 2 (two) times daily. While taking pain meds to prevent constipation 30 tablet 0  . tamoxifen (NOLVADEX) 20 MG tablet Take 1 tablet by mouth  daily 90 tablet 3  . HYDROcodone-acetaminophen (NORCO/VICODIN) 5-325 MG tablet Take 1-2 tablets by mouth every  6 (six) hours as needed for moderate pain. (Patient not  taking: Reported on 01/30/2016) 30 tablet 0  . PRESCRIPTION MEDICATION Apply 1 application topically as needed. Reported on 01/30/2016    . [DISCONTINUED] Ranitidine HCl (ZANTAC 75 PO) Take by mouth.       No current facility-administered medications for this visit.    Review of Systems  Constitutional: Negative for fever and chills. Negative for weight loss and malaise/fatigue.  HENT: Negative for congestion, hearing loss, nosebleeds, sore throat and tinnitus.   Eyes: Negative for blurred vision, double vision, pain and discharge.  Respiratory: Negative for cough, hemoptysis, sputum production, shortness of breath and wheezing.   Cardiovascular: Negative for chest pain, palpitations, claudication, leg swelling and PND.  Gastrointestinal: Negative for heartburn, nausea, vomiting, abdominal pain, diarrhea, constipation, blood in stool and melena.  Genitourinary: Negative for dysuria, urgency, frequency and hematuria.  Musculoskeletal: Negative for myalgias, joint pain and falls.  Skin: Negative for itching and rash.  Neurological: Negative for dizziness, tingling, tremors, sensory change, speech change, focal weakness, seizures, loss of consciousness, weakness and headaches.  Endo/Heme/Allergies: Does not bruise/bleed easily.  Psychiatric/Behavioral: Negative for depression, suicidal ideas, memory loss and substance abuse. The patient is not nervous/anxious and does not have insomnia.   See HPI 14 point review of systems was performed and is negative except as detailed under history of present illness and above  PHYSICAL EXAMINATION: ECOG PERFORMANCE STATUS: 0 - Asymptomatic  Filed Vitals:   01/30/16 1246  BP: 130/78  Pulse: 70  Temp: 98.3 F (36.8 C)  Resp: 18   Filed Weights   01/30/16 1246  Weight: 224 lb (101.606 kg)    Physical Exam  Constitutional: She is oriented to person, place, and time and well-developed, well-nourished, and in no distress. Wears glasses. HENT:    Head: Normocephalic and atraumatic.  Nose: Nose normal.  Mouth/Throat: Oropharynx is clear and moist. No oropharyngeal exudate.  Eyes: Conjunctivae and EOM are normal. Pupils are equal, round, and reactive to light. Right eye exhibits no discharge. Left eye exhibits no discharge. No scleral icterus.  Neck: Normal range of motion. Neck supple. No tracheal deviation present. No thyromegaly present.  Cardiovascular: Normal rate, regular rhythm and normal heart sounds.  Exam reveals no gallop and no friction rub.   No murmur heard. Pulmonary/Chest: Effort normal and breath sounds normal. She has no wheezes. She has no rales.  Abdominal: Soft. Bowel sounds are normal. She exhibits no distension and no mass. There is no tenderness. There is no rebound and no guarding. Multiple small well healed surgical incision sites  Musculoskeletal: Normal range of motion. She exhibits RUE edema, new from prior visit, confined to the RUE Lymphadenopathy:    She has no cervical adenopathy.  Neurological: She is alert and oriented to person, place, and time. She has normal reflexes. No cranial nerve deficit. Gait normal. Coordination normal.  Skin: Skin is warm and dry. No rash noted.  Psychiatric: Mood, memory, affect and judgment normal.  Nursing note and vitals reviewed.    LABORATORY DATA:  I have reviewed the data as listed  Results for ARLECIA, MARSCHNER (MRN [id number]) as of 01/30/2016 12:48  Ref. Range 01/30/2016 12:23  WBC Latest Ref Range: 4.0-10.5 K/uL 6.1  RBC Latest Ref Range: 3.87-5.11 MIL/uL 3.95  Hemoglobin Latest Ref Range: 12.0-15.0 g/dL 12.4  HCT Latest Ref Range: 36.0-46.0 % 37.2  MCV Latest Ref Range: 78.0-100.0 fL 94.2  MCH Latest Ref Range: 26.0-34.0 pg 31.4  MCHC Latest  Ref Range: 30.0-36.0 g/dL 33.3  RDW Latest Ref Range: 11.5-15.5 % 13.3  Platelets Latest Ref Range: 150-400 K/uL 259  Neutrophils Latest Units: % 42  Lymphocytes Latest Units: % 44  Monocytes Relative Latest  Units: % 7  Eosinophil Latest Units: % 6  Basophil Latest Units: % 1  NEUT# Latest Ref Range: 1.7-7.7 K/uL 2.5  Lymphocyte # Latest Ref Range: 0.7-4.0 K/uL 2.7  Monocyte # Latest Ref Range: 0.1-1.0 K/uL 0.4  Eosinophils Absolute Latest Ref Range: 0.0-0.7 K/uL 0.4  Basophils Absolute Latest Ref Range: 0.0-0.1 K/uL 0.0   RADIOLOGY: I have personally reviewed the radiological images as listed and agreed with the findings in the report.   Study Result     70 years old with a right total knee  2 years ago routine follow-up annual x-ray  Radiology report  3 views RIGHT knee  AP lateral and patellar sunrise views of the knee  FINDINGS: A total knee prosthesis is noted. It is in anatomic alignment. There is no evidence of loosening.  Impression : normal TKA xray    Study Result     CLINICAL DATA: Right upper quadrant abdominal pain for 4 months. Reported swelling. History of breast cancer on the right treated with mastectomy.  EXAM: CT ABDOMEN AND PELVIS WITHOUT AND WITH CONTRAST  TECHNIQUE: Multidetector CT imaging of the abdomen and pelvis was performed following the standard protocol before and following the bolus administration of intravenous contrast.  CONTRAST: 1103mL OMNIPAQUE IOHEXOL 350 MG/ML SOLN  COMPARISON: 07/05/2015  FINDINGS: Lower chest: Right mastectomy. Mild cardiomegaly.  Hepatobiliary: 0.8 by 0.5 cm hypodense lesion in the dome of the right hepatic lobe, image 15 series 5, nonspecific due to small size, no prior cross-sectional imaging in this area for comparison. Diffuse hepatic steatosis.  Pancreas: Unremarkable  Spleen: Unremarkable  Adrenals/Urinary Tract: Low-density fullness of the medial limb right adrenal gland without overt adrenal mass.  2.9 by 2.4 cm partially exophytic mass of the right kidney lower pole anteriorly demonstrates arterial phase enhancement, compatible with malignancy. There is scarring in the  right kidney upper pole.  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  Stomach/Bowel: Descending and sigmoid colon diverticulosis without active diverticulitis.  Vascular/Lymphatic: No significant atherosclerosis in the abdomen. No tumor thrombus in the right renal vein. No pathologic retroperitoneal adenopathy. A left periaortic node measures 0.6 cm in short axis on image 78 series 4, within normal size limits.  Reproductive: Uterus absent. Bilateral ovarian remnants unremarkable.  Other: No supplemental non-categorized findings.  Musculoskeletal: Mild bilateral chronic sacroiliitis with sclerosis along the sacroiliac joints. Posterolateral rod and pedicle screw fixation at [id number] without complicating features; chronic mild fused anterolisthesis at L3-4. Solid interbody fusion at L3-4 and L4-5.  IMPRESSION: 1. 2.9 by 2.4 cm partially exophytic enhancing mass of the right kidney lower pole anteriorly. Based on morphology and appearance I favor this representing a renal cell carcinoma, metastatic breast cancer less likely. No tumor thrombus in the renal vein or regional adenopathy identified. 2. 8 by 5 mm hypodense lesion in the dome of the right hepatic lobe, statistically likely to be benign but technically nonspecific due to small size. 3. Additional findings at individual levels are as follows: Mild cardiomegaly. Diffuse hepatic steatosis. Duplicated right collecting system. Scarring in the right kidney upper pole. Descending and sigmoid colon diverticulosis. Mild chronic bilateral sacroiliitis. These results will be called  to the ordering clinician or representative by the Radiologist Assistant, and communication documented in the PACS or zVision Dashboard.   Electronically Signed  By: Van Clines M.D.  On: 11/26/2015  14:54    PATHOLOGY:      ASSESSMENT & PLAN:   DCIS/LCIS of the right breast Mastectomy Tamoxifen for chemoprevention  Malignant neoplasm of upper outer quadrant of female breast Extensive DCIS/LCIS of right breast, S/P simple right mastectomy and SLN which was negative for metastatic disease on 11/20/2014 by Dr. Brantley Stage.  Beginning Tamoxifen daily as chemoprevention on 01/01/2015. She is up to date on mammography of the Left breast. She is tolerating tamoxifen without difficulty.   Initially she opted for reconstruction but did not complete it.  LYMPHEDEMA  She will be referred to Lymphedema therapy.  Stage I RCC R kidney She underwent a robotic assisted laparoscopic R partial nephrectomy on 01/08/2016 with Dr. Tresa Moore. She is doing well.  Fatty liver Diffuse hepatic steatosis noted on CT imaging. She has a known history of fatty liver.  CT imaging also saw a small liver lesion at the liver dome.  I advised the patient that this would be followed, in addition per her request we will get her back to GI.  She will return in 4 months for a follow up.   All questions were answered. The patient knows to call the clinic with any problems, questions or concerns.   This note was electronically signed.  This document serves as a record of services personally performed by Ancil Linsey, MD. It was created on her behalf by Kandace Blitz, a trained medical scribe. The creation of this record is based on the scribe's personal observations and the provider's statements to them. This document has been checked and approved by the attending provider.  I have reviewed the above documentation for accuracy and completeness, and I agree with the above.  Kelby Fam. Whitney Muse, MD

## 2016-02-18 DIAGNOSIS — Z0000 Encounter for general adult medical examination without abnormal findings: Secondary | ICD-10-CM | POA: Diagnosis not present

## 2016-02-18 DIAGNOSIS — I10 Essential (primary) hypertension: Secondary | ICD-10-CM | POA: Diagnosis not present

## 2016-02-18 DIAGNOSIS — E039 Hypothyroidism, unspecified: Secondary | ICD-10-CM | POA: Diagnosis not present

## 2016-02-19 ENCOUNTER — Ambulatory Visit: Payer: Medicare Other | Admitting: Gastroenterology

## 2016-03-04 ENCOUNTER — Ambulatory Visit: Payer: Medicare Other | Admitting: Gastroenterology

## 2016-04-01 ENCOUNTER — Encounter: Payer: Self-pay | Admitting: Gastroenterology

## 2016-04-01 ENCOUNTER — Ambulatory Visit (INDEPENDENT_AMBULATORY_CARE_PROVIDER_SITE_OTHER): Payer: Medicare Other | Admitting: Gastroenterology

## 2016-04-01 VITALS — BP 122/81 | HR 67 | Temp 98.5°F | Ht 66.0 in | Wt 223.2 lb

## 2016-04-01 DIAGNOSIS — K760 Fatty (change of) liver, not elsewhere classified: Secondary | ICD-10-CM | POA: Diagnosis not present

## 2016-04-01 DIAGNOSIS — R1011 Right upper quadrant pain: Secondary | ICD-10-CM | POA: Diagnosis not present

## 2016-04-01 NOTE — Assessment & Plan Note (Addendum)
Chronic RUQ pain of unclear etiology. I do not feel we are dealing with an occult etiology; it is likely she may have chronic abdominal pain, highly likely adhesive disease. It appears this dates back several years and is at times related to certain dietary choices. LFTs have remained normal. Imaging does note a non-specific liver lesion, and with her extensive history, will proceed with dedicated MRI of liver, which she desires. I did discuss that this may not be able to delineate due to size and still may need to just be followed for now. She understands this. May need repeat EGD in near future if persistent abdominal pain.   Next colonoscopy 2018.

## 2016-04-01 NOTE — Assessment & Plan Note (Signed)
LFTs remain normal. Discussed at length dietary and behavior modification. Discussed exercise. Will check serially.

## 2016-04-01 NOTE — Progress Notes (Signed)
cc'ed to pcp °

## 2016-04-01 NOTE — Progress Notes (Signed)
Primary Care Physician:  Maggie Font, MD Primary Gastroenterologist:  Dr. Gala Romney   Chief Complaint  Patient presents with  . OTHER    fatty liver    HPI:   Sara [name] is a 70 y.o. female presenting today at the request of Dr. Whitney Muse secondary to hepatic steatosis. I last saw her in March 2013 due to dysphagia. She underwent an EGD with dilation of esophageal web and Schatzki's ring. She had antral and duodenal erosions likely secondary to NSAID effect, along with chronic duodenitis on path. She will be due for a colonoscopy in 2018.   RUQ constant. Pain is not worse with eating. Waxes and wanes. Sometimes feels like it is swollen. Pain present for about a year. However, upon looking back in her record, she was reporting burning pain in 2013. No relief unless sleeping. A few weeks ago had a glass of red wine and the pain was worse. GERD controlled, no dysphagia. Omeprazole 40 mg daily. No rectal bleeding. No changes in bowel habits. States she was heme negative through her insurance recently.   Recent imaging via CT abd with/without contrast in March 2017 noted an 8 by 5 mm hypodense lesion in the dome of the right hepatic lobe that was felt to be benign but non-specific due to size. She was found to have a right renal mass and underwent a right partial nephrectomy in April 2017. She also has a history of right breast cancer. She is followed by Dr. Whitney Muse.    Past Medical History  Diagnosis Date  . Pure hypercholesterolemia     takes Pravastin daily  . Allergic rhinitis due to pollen   . Cervicalgia   . Diverticulosis of colon (without mention of hemorrhage)   . Morbid obesity ([place])   . Palpitations   . Peptic ulcer, unspecified site, unspecified as acute or chronic, without mention of hemorrhage, perforation, or obstruction   . Shortness of breath   . H/O hiatal hernia   . HTN (hypertension)     takes Metoprolol and Lisinopril daily  . Hypertension   . GERD  (gastroesophageal reflux disease)     takes Omeprazole daily  . Constipation     takes Miralax daily  . Insomnia     takes Ativan nigtly  . Other postablative hypothyroidism     takes SYnthroid daily  . Joint pain     "all of them"  . Joint swelling     "knees, legs, ankles sometimes" (11/20/2014)  . Graves' disease     "took a pill to correct"  . Headache(784.0)     denies migraines since the 80's but has occ and takes Butalbital prn  . Cervical spondylosis without myelopathy     all over  . Degeneration of cervical intervertebral disc   . Primary localized osteoarthrosis, lower leg   . Arthritis     "knees, back, right elbow; left shoulder; right ankle" (11/20/2014)  . Chronic lower back pain   . Anxiety   . Depression   . Breast cancer, right breast ([place])     "DCIS; zero stage" S/P mastectomy 11/20/2014  . Tendonitis of knee     bilateral  . Numbness     LOWER LEGS  . Renal mass     RIGHT  . Recurrent UTI   . H/O urinary frequency   . Sleep apnea     slight but doesn't require a CPAP (11/20/2014)    Past Surgical History  Procedure Laterality Date  . Knee arthroscopy Right   . Appendectomy    . Colonoscopy  Sept 2008    RMR: normal rectum, left-sided diverticula, repeat in 2018  . Abdominal hysterectomy    . Upper gastrointestinal endoscopy    . Cataract extraction w/ intraocular lens  implant, bilateral Bilateral   . Lumpectomy on pelvis      "mass of nerves"  . Diagnostic laparoscopy    . Tubal ligation    . Esophagogastroduodenoscopy    . Anterior lat lumbar fusion  05/13/2012    Procedure: ANTERIOR LATERAL LUMBAR FUSION 2 LEVELS;  Surgeon: Faythe Ghee, MD;  Location: [facility];  Service: Neurosurgery;  Laterality: Left;  Left Lumbar Three-four,Lumbar four-five Extreme Lumbar Interbody Fusion with Percutaneous Pedicle Screws  . Knee arthroscopy with lateral menisectomy Right 10/06/2013    Procedure: KNEE ARTHROSCOPY WITH LATERAL AND MEDIAL MENISECTOMY;   Surgeon: Carole Civil, MD;  Location: [facility];  Service: Orthopedics;  Laterality: Right;  END @ 1234  . Foreign body removal Right 10/06/2013    Procedure: REMOVAL FOREIGN BODY EXTREMITY;  Surgeon: Carole Civil, MD;  Location: [facility];  Service: Orthopedics;  Laterality: Right;  . Injection knee Left 10/06/2013    Procedure: KNEE INJECTION;  Surgeon: Carole Civil, MD;  Location: [facility];  Service: Orthopedics;  Laterality: Left;  . Total knee arthroplasty Right 01/24/2014    Procedure: TOTAL KNEE ARTHROPLASTY;  Surgeon: Carole Civil, MD;  Location: [facility];  Service: Orthopedics;  Laterality: Right;  . Total knee arthroplasty Left 08/08/2014    Procedure: TOTAL KNEE ARTHROPLASTY;  Surgeon: Carole Civil, MD;  Location: [facility];  Service: Orthopedics;  Laterality: Left;  Marland Kitchen Mastectomy complete / simple w/ sentinel node biopsy Right 11/20/2014    axillary  . Breast reconstruction with placement of tissue expander and flex hd (acellular hydrated dermis) Right 11/20/2014  . Joint replacement    . Tonsillectomy    . Dilation and curettage of uterus    . Breast lumpectomy Left 1988  . Breast biopsy Left ~ 2014  . Breast biopsy Right 2015  . Simple mastectomy with axillary sentinel node biopsy Right 11/20/2014    Procedure: SIMPLE MASTECTOMY WITH AXILLARY SENTINEL NODE BIOPSY;  Surgeon: Erroll Luna, MD;  Location: [facility];  Service: General;  Laterality: Right;  . Breast reconstruction with placement of tissue expander and flex hd (acellular hydrated dermis) Right 11/20/2014    Procedure: RIGHT BREAST RECONSTRUCTION WITH PLACEMENT OF TISSUE EXPANDER AND ACELLULAR DERMA MATRIX ([place]);  Surgeon: Crissie Reese, MD;  Location: [facility];  Service: Plastics;  Laterality: Right;  . Tissue expander removal Right 12/13/2014    dr Harlow Mares  . Removal of tissue expander and placement of implant Right 12/13/2014    Procedure: REMOVAL OF RIGHT BREAST TISSUE EXPANDER;  Surgeon: Crissie Reese, MD;  Location: [facility];  Service: Plastics;  Laterality: Right;  . Robotic assited partial nephrectomy Right 01/08/2016    Procedure: XI ROBOTIC ASSITED PARTIAL NEPHRECTOMY;  Surgeon: Alexis Frock, MD;  Location: [facility];  Service: Urology;  Laterality: Right;  . Esophagogastroduodenoscopy  2013    Dr. Gala Romney: Cervical esophageal web and Schatzki's ring s/p dilation, small hiatal hernia, antral and duodenal erosions likely NSAID effect, chronic duodenitis on path     Current Outpatient Prescriptions  Medication Sig Dispense Refill  . acetaminophen (TYLENOL) 500 MG tablet Take 1,000 mg by mouth every 6 (six) hours as needed for moderate  pain.     . aspirin 81 MG tablet Take 81 mg by mouth daily.    . diphenhydrAMINE (SOMINEX) 25 MG tablet Take 50 mg by mouth at bedtime as needed for allergies or sleep.    . fluticasone (FLONASE) 50 MCG/ACT nasal spray Place 2 sprays into the nose at bedtime.     . gabapentin (NEURONTIN) 600 MG tablet 1/2 tab during the day and one tab at bedtime (Patient taking differently: Take 600 mg by mouth at bedtime. 1/2 tab during the day and one tab at bedtime) 180 tablet 3  . levothyroxine (SYNTHROID, LEVOTHROID) 88 MCG tablet Take 88 mcg by mouth daily before breakfast.     . LORazepam (ATIVAN) 1 MG tablet Take 0.5 mg by mouth at bedtime as needed for sleep. For sleep    . losartan-hydrochlorothiazide (HYZAAR) 100-12.5 MG per tablet Take 1 tablet by mouth daily.     . metoprolol tartrate (LOPRESSOR) 25 MG tablet Take 25 mg by mouth 2 (two) times daily.     Marland Kitchen omeprazole (PRILOSEC) 40 MG capsule Take 40 mg by mouth daily before breakfast.     . oxybutynin (DITROPAN) 5 MG tablet Take 5 mg by mouth at bedtime. Reported on 01/30/2016    . pravastatin (PRAVACHOL) 40 MG tablet Take 40 mg by mouth every morning.     . tamoxifen (NOLVADEX) 20 MG tablet Take 1 tablet by mouth  daily 90 tablet 3  . [DISCONTINUED] Ranitidine HCl (ZANTAC 75 PO) Take by mouth.       No current  facility-administered medications for this visit.    Allergies as of 04/01/2016 - Review Complete 04/01/2016  Allergen Reaction Noted  . Propoxyphene hcl Other (See Comments) 09/07/2006  . Pollen extract Other (See Comments) 12/12/2014  . Camphor Itching 01/03/2014  . Dust mite extract Itching 10/06/2013  . Latex Dermatitis and Rash 06/24/2011  . Molds & smuts Itching 10/06/2013  . Tomato Hives 05/13/2012    Family History  Problem Relation Age of Onset  . Colon cancer Neg Hx     Mother died at 55 from blood clot, MI  . Heart attack      2 Brothers, 3 sisters/Father died in 96's from old age  . Stroke Sister   . Osteoarthritis Sister   . Osteoarthritis Brother   . Obesity Brother   . Heart attack Sister   . Heart attack Father   . Lung cancer Maternal Grandmother     non-smoker    Social History   Social History  . Marital Status: Married    Spouse Name: Milbert Coulter  . Number of Children: 4  . Years of Education: N/A   Occupational History  . disability     since 2008, knee surgery   Social History Main Topics  . Smoking status: Former Smoker -- 1.00 packs/day for 27 years    Types: Cigarettes    Quit date: 12/26/1985  . Smokeless tobacco: Never Used     Comment: Quit in 1887  . Alcohol Use: 2.4 oz/week    4 Glasses of wine, 0 Standard drinks or equivalent per week     Comment: occasional wine  . Drug Use: No  . Sexual Activity: Yes    Birth Control/ Protection: Surgical   Other Topics Concern  . Not on file   Social History Narrative   Currently married for 10 years. Husband's name is Milbert Coulter. She is an excellent singer. 8 children between she and her husband. 23 grand  children. 12 great grandchildren.    Review of Systems: Gen: Denies any fever, chills, fatigue, weight loss, lack of appetite.  CV: Denies chest pain, heart palpitations, peripheral edema, syncope.  Resp: +SOB GI: See HPI  GU : Denies urinary burning, urinary frequency, urinary hesitancy MS:  +joint pain  Derm: Denies rash, itching, dry skin Psych: Denies depression, anxiety, memory loss, and confusion Heme: Denies bruising, bleeding, and enlarged lymph nodes.  Physical Exam: BP 122/81 mmHg  Pulse 67  Temp(Src) 98.5 F (36.9 C) (Oral)  Ht 5\' 6"  (1.676 m)  Wt 223 lb 3.2 oz (101.243 kg)  BMI 36.04 kg/m2 General:   Alert and oriented. Pleasant and cooperative. Well-nourished and well-developed.  Head:  Normocephalic and atraumatic. Eyes:  Without icterus, sclera clear and conjunctiva pink.  Ears:  Normal auditory acuity. Nose:  No deformity, discharge,  or lesions. Mouth:  No deformity or lesions, oral mucosa pink.  Lungs:  Clear to auscultation bilaterally. No wheezes, rales, or rhonchi. No distress.  Heart:  S1, S2 present without murmurs appreciated.  Abdomen:  +BS, soft, TTP RUQ and non-distended. No HSM noted. No guarding or rebound. No masses appreciated.  Rectal:  Deferred  Msk:  Symmetrical without gross deformities. Normal posture. Extremities:  Without  edema. Neurologic:  Alert and  oriented x4;  grossly normal neurologically. Psych:  Alert and cooperative. Normal mood and affect.  Lab Results  Component Value Date   ALT 48 01/30/2016   AST 37 01/30/2016   ALKPHOS 47 01/30/2016   BILITOT 0.6 01/30/2016

## 2016-04-01 NOTE — Patient Instructions (Addendum)
We have scheduled you for an MRI of your liver.  I have included a small list of healthy fats. Like we talked about, all fats are not "bad".   Further recommendations to follow!       Fat and Cholesterol Restricted Diet High levels of fat and cholesterol in your blood may lead to various health problems, such as diseases of the heart, blood vessels, gallbladder, liver, and pancreas. Fats are concentrated sources of energy that come in various forms. Certain types of fat, including saturated fat, may be harmful in excess. Cholesterol is a substance needed by your body in small amounts. Your body makes all the cholesterol it needs. Excess cholesterol comes from the food you eat. When you have high levels of cholesterol and saturated fat in your blood, health problems can develop because the excess fat and cholesterol will gather along the walls of your blood vessels, causing them to narrow. Choosing the right foods will help you control your intake of fat and cholesterol. This will help keep the levels of these substances in your blood within normal limits and reduce your risk of disease. WHAT IS MY PLAN? Your health care provider recommends that you:  Get no more than __________ % of the total calories in your daily diet from fat.  Limit your intake of saturated fat to less than ______% of your total calories each day.  Limit the amount of cholesterol in your diet to less than _________mg per day. WHAT TYPES OF FAT SHOULD I CHOOSE?  Choose healthy fats more often. Choose monounsaturated and polyunsaturated fats, such as olive and canola oil, flaxseeds, walnuts, almonds, and seeds.  Eat more omega-3 fats. Good choices include salmon, mackerel, sardines, tuna, flaxseed oil, and ground flaxseeds. Aim to eat fish at least two times a week.  Limit saturated fats. Saturated fats are primarily found in animal products, such as meats, butter, and cream. Plant sources of saturated fats include palm  oil, palm kernel oil, and coconut oil.  Avoid foods with partially hydrogenated oils in them. These contain trans fats. Examples of foods that contain trans fats are stick margarine, some tub margarines, cookies, crackers, and other baked goods. WHAT GENERAL GUIDELINES DO I NEED TO FOLLOW? These guidelines for healthy eating will help you control your intake of fat and cholesterol:  Check food labels carefully to identify foods with trans fats or high amounts of saturated fat.  Fill one half of your plate with vegetables and green salads.  Fill one fourth of your plate with whole grains. Look for the word "whole" as the first word in the ingredient list.  Fill one fourth of your plate with lean protein foods.  Limit fruit to two servings a day. Choose fruit instead of juice.  Eat more foods that contain soluble fiber. Examples of foods that contain this type of fiber are apples, broccoli, carrots, beans, peas, and barley. Aim to get 20-30 g of fiber per day.  Eat more home-cooked food and less restaurant, buffet, and fast food.  Limit or avoid alcohol.  Limit foods high in starch and sugar.  Limit fried foods.  Cook foods using methods other than frying. Baking, boiling, grilling, and broiling are all great options.  Lose weight if you are overweight. Losing just 5-10% of your initial body weight can help your overall health and prevent diseases such as diabetes and heart disease. WHAT FOODS CAN I EAT? Grains Whole grains, such as whole wheat or whole grain breads, crackers,  cereals, and pasta. Unsweetened oatmeal, bulgur, barley, quinoa, or brown rice. Corn or whole wheat flour tortillas. Vegetables Fresh or frozen vegetables (raw, steamed, roasted, or grilled). Green salads. Fruits All fresh, canned (in natural juice), or frozen fruits. Meat and Other Protein Products Ground beef (85% or leaner), grass-fed beef, or beef trimmed of fat. Skinless chicken or [place]. Ground chicken  or [place]. Pork trimmed of fat. All fish and seafood. Eggs. Dried beans, peas, or lentils. Unsalted nuts or seeds. Unsalted canned or dry beans. Dairy Low-fat dairy products, such as skim or 1% milk, 2% or reduced-fat cheeses, low-fat ricotta or cottage cheese, or plain low-fat yogurt. Fats and Oils Tub margarines without trans fats. Light or reduced-fat mayonnaise and salad dressings. Avocado. Olive, canola, sesame, or safflower oils. Natural peanut or almond butter (choose ones without added sugar and oil). The items listed above may not be a complete list of recommended foods or beverages. Contact your dietitian for more options. WHAT FOODS ARE NOT RECOMMENDED? Grains White bread. White pasta. White rice. Cornbread. Bagels, pastries, and croissants. Crackers that contain trans fat. Vegetables White potatoes. Corn. Creamed or fried vegetables. Vegetables in a cheese sauce. Fruits Dried fruits. Canned fruit in light or heavy syrup. Fruit juice. Meat and Other Protein Products Fatty cuts of meat. Ribs, chicken wings, bacon, sausage, bologna, salami, chitterlings, fatback, hot dogs, bratwurst, and packaged luncheon meats. Liver and organ meats. Dairy Whole or 2% milk, cream, half-and-half, and cream cheese. Whole milk cheeses. Whole-fat or sweetened yogurt. Full-fat cheeses. Nondairy creamers and whipped toppings. Processed cheese, cheese spreads, or cheese curds. Sweets and Desserts Corn syrup, sugars, honey, and molasses. Candy. Jam and jelly. Syrup. Sweetened cereals. Cookies, pies, cakes, donuts, muffins, and ice cream. Fats and Oils Butter, stick margarine, lard, shortening, ghee, or bacon fat. Coconut, palm kernel, or palm oils. Beverages Alcohol. Sweetened drinks (such as sodas, lemonade, and fruit drinks or punches). The items listed above may not be a complete list of foods and beverages to avoid. Contact your dietitian for more information.   This information is not intended to  replace advice given to you by your health care provider. Make sure you discuss any questions you have with your health care provider.   Document Released: 09/07/2005 Document Revised: 09/28/2014 Document Reviewed: 12/06/2013 Elsevier Interactive Patient Education [employer].

## 2016-04-14 ENCOUNTER — Ambulatory Visit (HOSPITAL_COMMUNITY)
Admission: RE | Admit: 2016-04-14 | Discharge: 2016-04-14 | Disposition: A | Discharge status: Home / Self Care | Payer: Medicare Other | Source: Ambulatory Visit | Attending: Gastroenterology | Admitting: Gastroenterology

## 2016-04-14 DIAGNOSIS — K7689 Other specified diseases of liver: Secondary | ICD-10-CM | POA: Diagnosis not present

## 2016-04-14 DIAGNOSIS — K760 Fatty (change of) liver, not elsewhere classified: Secondary | ICD-10-CM | POA: Diagnosis not present

## 2016-04-14 DIAGNOSIS — R1011 Right upper quadrant pain: Secondary | ICD-10-CM

## 2016-04-14 LAB — POCT I-STAT CREATININE: Creatinine, Ser: 0.8 mg/dL (ref 0.44–1.00)

## 2016-04-14 MED ORDER — GADOBENATE DIMEGLUMINE 529 MG/ML IV SOLN
20.0000 mL | Freq: Once | INTRAVENOUS | Status: AC | PRN
  Administered 2016-04-14: 20 mL via INTRAVENOUS

## 2016-04-15 ENCOUNTER — Telehealth: Payer: Self-pay | Admitting: Orthopedic Surgery

## 2016-04-15 NOTE — Telephone Encounter (Signed)
no

## 2016-04-15 NOTE — Telephone Encounter (Signed)
Patient called to relay that she has a "different pain" in left knee, states "a burning feeling" - I offered appointment next available, as she is scheduled in November for regular total left knee follow up.  She asked if there is any other advice before she schedules.  Please call Cell# [phone]

## 2016-04-16 NOTE — Telephone Encounter (Signed)
Called back to patient; appointment scheduled. °

## 2016-04-16 NOTE — Telephone Encounter (Signed)
Routing to Carol to schedule 

## 2016-04-23 ENCOUNTER — Other Ambulatory Visit: Payer: Self-pay | Admitting: *Deleted

## 2016-04-23 DIAGNOSIS — M25562 Pain in left knee: Secondary | ICD-10-CM

## 2016-04-27 ENCOUNTER — Ambulatory Visit (HOSPITAL_COMMUNITY)
Admission: RE | Admit: 2016-04-27 | Discharge: 2016-04-27 | Disposition: A | Discharge status: Home / Self Care | Payer: Medicare Other | Source: Ambulatory Visit | Attending: Orthopedic Surgery | Admitting: Orthopedic Surgery

## 2016-04-27 ENCOUNTER — Ambulatory Visit (INDEPENDENT_AMBULATORY_CARE_PROVIDER_SITE_OTHER): Payer: Medicare Other | Admitting: Orthopedic Surgery

## 2016-04-27 ENCOUNTER — Telehealth: Payer: Self-pay | Admitting: Internal Medicine

## 2016-04-27 ENCOUNTER — Encounter: Payer: Self-pay | Admitting: Orthopedic Surgery

## 2016-04-27 VITALS — BP 114/63 | Ht 66.0 in | Wt 221.0 lb

## 2016-04-27 DIAGNOSIS — M76899 Other specified enthesopathies of unspecified lower limb, excluding foot: Secondary | ICD-10-CM

## 2016-04-27 DIAGNOSIS — M25562 Pain in left knee: Secondary | ICD-10-CM | POA: Insufficient documentation

## 2016-04-27 DIAGNOSIS — Z96652 Presence of left artificial knee joint: Secondary | ICD-10-CM | POA: Insufficient documentation

## 2016-04-27 DIAGNOSIS — M769 Unspecified enthesopathy, lower limb, excluding foot: Secondary | ICD-10-CM

## 2016-04-27 DIAGNOSIS — M5410 Radiculopathy, site unspecified: Secondary | ICD-10-CM | POA: Diagnosis not present

## 2016-04-27 DIAGNOSIS — Z96651 Presence of right artificial knee joint: Secondary | ICD-10-CM | POA: Diagnosis not present

## 2016-04-27 DIAGNOSIS — Z981 Arthrodesis status: Secondary | ICD-10-CM | POA: Diagnosis not present

## 2016-04-27 NOTE — Telephone Encounter (Signed)
Pt called to see if her MRI results were available. Please call [phone]

## 2016-04-27 NOTE — Progress Notes (Signed)
Cyst noted in liver. No concerning lesions. Fatty liver. This is good news.

## 2016-04-27 NOTE — Telephone Encounter (Signed)
Routing to AS 

## 2016-04-27 NOTE — Patient Instructions (Signed)
Exercises as directed   call Dr Hal Neer for appt

## 2016-04-27 NOTE — Progress Notes (Signed)
Chief Complaint  Patient presents with  . Knee Pain    LEFT KNEE PAIN, TKA 08/08/14   HPI   Status post left total knee and status post lumbar fusion presents with left leg burning pain and pain over the quadriceps tendons left knee no new trauma. Pain is moderate to severe of several weeks duration and constant. No other associated symptoms of giving way locking or catching  Review of Systems  Constitutional: Negative for chills and fever.  Musculoskeletal: Positive for back pain and joint pain.  Neurological: Positive for tingling and sensory change.    Past Medical History:  Diagnosis Date  . Allergic rhinitis due to pollen   . Anxiety   . Arthritis    "knees, back, right elbow; left shoulder; right ankle" (11/20/2014)  . Breast cancer, right breast ([place])    "DCIS; zero stage" S/P mastectomy 11/20/2014  . Cervical spondylosis without myelopathy    all over  . Cervicalgia   . Chronic lower back pain   . Constipation    takes Miralax daily  . Degeneration of cervical intervertebral disc   . Depression   . Diverticulosis of colon (without mention of hemorrhage)   . GERD (gastroesophageal reflux disease)    takes Omeprazole daily  . Graves' disease    "took a pill to correct"  . H/O hiatal hernia   . H/O urinary frequency   . Headache(784.0)    denies migraines since the 80's but has occ and takes Butalbital prn  . HTN (hypertension)    takes Metoprolol and Lisinopril daily  . Hypertension   . Insomnia    takes Ativan nigtly  . Joint pain    "all of them"  . Joint swelling    "knees, legs, ankles sometimes" (11/20/2014)  . Morbid obesity ([place])   . Numbness    LOWER LEGS  . Other postablative hypothyroidism    takes SYnthroid daily  . Palpitations   . Peptic ulcer, unspecified site, unspecified as acute or chronic, without mention of hemorrhage, perforation, or obstruction   . Primary localized osteoarthrosis, lower leg   . Pure hypercholesterolemia    takes  Pravastin daily  . Recurrent UTI   . Renal mass    RIGHT  . Shortness of breath   . Sleep apnea    slight but doesn't require a CPAP (11/20/2014)  . Tendonitis of knee    bilateral    Past Surgical History:  Procedure Laterality Date  . ABDOMINAL HYSTERECTOMY    . ANTERIOR LAT LUMBAR FUSION  05/13/2012   Procedure: ANTERIOR LATERAL LUMBAR FUSION 2 LEVELS;  Surgeon: Faythe Ghee, MD;  Location: [facility];  Service: Neurosurgery;  Laterality: Left;  Left Lumbar Three-four,Lumbar four-five Extreme Lumbar Interbody Fusion with Percutaneous Pedicle Screws  . APPENDECTOMY    . BREAST BIOPSY Left ~ 2014  . BREAST BIOPSY Right 2015  . BREAST LUMPECTOMY Left 1988  . BREAST RECONSTRUCTION WITH PLACEMENT OF TISSUE EXPANDER AND FLEX HD (ACELLULAR HYDRATED DERMIS) Right 11/20/2014  . BREAST RECONSTRUCTION WITH PLACEMENT OF TISSUE EXPANDER AND FLEX HD (ACELLULAR HYDRATED DERMIS) Right 11/20/2014   Procedure: RIGHT BREAST RECONSTRUCTION WITH PLACEMENT OF TISSUE EXPANDER AND ACELLULAR DERMA MATRIX (ACELLULAR DERMA MATRIX);  Surgeon: Crissie Reese, MD;  Location: [facility];  Service: Plastics;  Laterality: Right;  . CATARACT EXTRACTION W/ INTRAOCULAR LENS  IMPLANT, BILATERAL Bilateral   . COLONOSCOPY  Sept 2008   RMR: normal rectum, left-sided diverticula, repeat in 2018  . DIAGNOSTIC LAPAROSCOPY    .  DILATION AND CURETTAGE OF UTERUS    . ESOPHAGOGASTRODUODENOSCOPY    . ESOPHAGOGASTRODUODENOSCOPY  2013   Dr. Gala Romney: Cervical esophageal web and Schatzki's ring s/p dilation, small hiatal hernia, antral and duodenal erosions likely NSAID effect, chronic duodenitis on path   . FOREIGN BODY REMOVAL Right 10/06/2013   Procedure: REMOVAL FOREIGN BODY EXTREMITY;  Surgeon: Carole Civil, MD;  Location: [facility];  Service: Orthopedics;  Laterality: Right;  . INJECTION KNEE Left 10/06/2013   Procedure: KNEE INJECTION;  Surgeon: Carole Civil, MD;  Location: [facility];  Service: Orthopedics;  Laterality: Left;  .  JOINT REPLACEMENT    . KNEE ARTHROSCOPY Right   . KNEE ARTHROSCOPY WITH LATERAL MENISECTOMY Right 10/06/2013   Procedure: KNEE ARTHROSCOPY WITH LATERAL AND MEDIAL MENISECTOMY;  Surgeon: Carole Civil, MD;  Location: [facility];  Service: Orthopedics;  Laterality: Right;  END @ 1234  . lumpectomy on pelvis     "mass of nerves"  . MASTECTOMY COMPLETE / SIMPLE W/ SENTINEL NODE BIOPSY Right 11/20/2014   axillary  . REMOVAL OF TISSUE EXPANDER AND PLACEMENT OF IMPLANT Right 12/13/2014   Procedure: REMOVAL OF RIGHT BREAST TISSUE EXPANDER;  Surgeon: Crissie Reese, MD;  Location: [facility];  Service: Plastics;  Laterality: Right;  . ROBOTIC ASSITED PARTIAL NEPHRECTOMY Right 01/08/2016   Procedure: XI ROBOTIC ASSITED PARTIAL NEPHRECTOMY;  Surgeon: Alexis Frock, MD;  Location: [facility];  Service: Urology;  Laterality: Right;  . SIMPLE MASTECTOMY WITH AXILLARY SENTINEL NODE BIOPSY Right 11/20/2014   Procedure: SIMPLE MASTECTOMY WITH AXILLARY SENTINEL NODE BIOPSY;  Surgeon: Erroll Luna, MD;  Location: [facility];  Service: General;  Laterality: Right;  . TISSUE EXPANDER REMOVAL Right 12/13/2014   dr Harlow Mares  . TONSILLECTOMY    . TOTAL KNEE ARTHROPLASTY Right 01/24/2014   Procedure: TOTAL KNEE ARTHROPLASTY;  Surgeon: Carole Civil, MD;  Location: [facility];  Service: Orthopedics;  Laterality: Right;  . TOTAL KNEE ARTHROPLASTY Left 08/08/2014   Procedure: TOTAL KNEE ARTHROPLASTY;  Surgeon: Carole Civil, MD;  Location: [facility];  Service: Orthopedics;  Laterality: Left;  . TUBAL LIGATION    . UPPER GASTROINTESTINAL ENDOSCOPY     Family History  Problem Relation Age of Onset  . Colon cancer Neg Hx     Mother died at 32 from blood clot, MI  . Heart attack      2 Brothers, 3 sisters/Father died in 8's from old age  . Stroke Sister   . Osteoarthritis Sister   . Osteoarthritis Brother   . Obesity Brother   . Heart attack Sister   . Heart attack Father   . Lung cancer Maternal Grandmother     non-smoker    Social History  Substance Use Topics  . Smoking status: Former Smoker    Packs/day: 1.00    Years: 27.00    Types: Cigarettes    Quit date: 12/26/1985  . Smokeless tobacco: Never Used     Comment: Quit in 1887  . Alcohol use 2.4 oz/week    4 Glasses of wine per week     Comment: occasional wine    Current Outpatient Prescriptions:  .  acetaminophen (TYLENOL) 500 MG tablet, Take 1,000 mg by mouth every 6 (six) hours as needed for moderate pain. , Disp: , Rfl:  .  aspirin 81 MG tablet, Take 81 mg by mouth daily., Disp: , Rfl:  .  diphenhydrAMINE (SOMINEX) 25 MG tablet, Take 50 mg by mouth at bedtime as needed for allergies or  sleep., Disp: , Rfl:  .  fluticasone (FLONASE) 50 MCG/ACT nasal spray, Place 2 sprays into the nose at bedtime. , Disp: , Rfl:  .  gabapentin (NEURONTIN) 600 MG tablet, 1/2 tab during the day and one tab at bedtime (Patient taking differently: Take 600 mg by mouth at bedtime. 1/2 tab during the day and one tab at bedtime), Disp: 180 tablet, Rfl: 3 .  levothyroxine (SYNTHROID, LEVOTHROID) 88 MCG tablet, Take 88 mcg by mouth daily before breakfast. , Disp: , Rfl:  .  LORazepam (ATIVAN) 1 MG tablet, Take 0.5 mg by mouth at bedtime as needed for sleep. For sleep, Disp: , Rfl:  .  losartan-hydrochlorothiazide (HYZAAR) 100-12.5 MG per tablet, Take 1 tablet by mouth daily. , Disp: , Rfl:  .  metoprolol tartrate (LOPRESSOR) 25 MG tablet, Take 25 mg by mouth 2 (two) times daily. , Disp: , Rfl:  .  omeprazole (PRILOSEC) 40 MG capsule, Take 40 mg by mouth daily before breakfast. , Disp: , Rfl:  .  oxybutynin (DITROPAN) 5 MG tablet, Take 5 mg by mouth at bedtime. Reported on 01/30/2016, Disp: , Rfl:  .  pravastatin (PRAVACHOL) 40 MG tablet, Take 40 mg by mouth every morning. , Disp: , Rfl:  .  tamoxifen (NOLVADEX) 20 MG tablet, Take 1 tablet by mouth  daily, Disp: 90 tablet, Rfl: 3  BP 114/63   Ht 5\' 6"  (1.676 m)   Wt 221 lb (100.2 kg)   BMI 35.67 kg/m   Physical Exam She  is awake alert and oriented 3 she is meticulously groomed her mood and affect are pleasant her gait and station show a slight limp favoring the left leg  Ortho Exam The inspection of the left knee reveals a well-healed incision tenderness over the quadriceps tendon mild crepitance on range of motion no swelling of the joint. Knee is stable in extension and flexion muscle strength and tone are normal skin incision as stated intact distal pulses normal. There is pain in her lower back tenderness in the lower back this tracks along the lateral thigh down across the lateral joint line of the knee.  No other sensory deficits in the left leg   ASSESSMENT: My personal interpretation of the images:  The last x-ray I have for her is from today well seated total knee with no loosening or complicating features alignment normal   Report EXAM: LEFT KNEE 3 VIEWS   COMPARISON:  02/25/2015.   FINDINGS: Total left knee replacement. No acute bony abnormality identified. Soft tissues unremarkable. Exam stable from prior exam.   IMPRESSION: Total left knee replacement. No acute abnormality identified . Exam appears stable from 02/25/2015     Electronically Signed   By: [place]   On: 04/27/2016 10:34   PLAN Recommend quadriceps exercises and since she had fusion has back tenderness and left leg radicular symptoms recommend she see Dr. Hilarie Fredrickson, MD 04/27/2016 12:13 PM

## 2016-04-27 NOTE — Telephone Encounter (Signed)
Please see result note 

## 2016-04-28 ENCOUNTER — Telehealth: Payer: Self-pay | Admitting: Orthopedic Surgery

## 2016-04-28 NOTE — Telephone Encounter (Signed)
Patient called to relay that she contacted [facility], Dr Hal Neer, as discussed at office visit 04/27/16, and was told that Dr Hal Neer has retired.  States that their office requests a referral w/recent notes, and they will be happy to schedule a new patient consult - fax# [phone]

## 2016-04-28 NOTE — Telephone Encounter (Signed)
Spoke with the pt.  

## 2016-04-29 NOTE — Telephone Encounter (Signed)
Office notes and referral  faxed to [facility]. Awaiting appointment

## 2016-04-30 ENCOUNTER — Other Ambulatory Visit: Payer: Self-pay | Admitting: *Deleted

## 2016-04-30 DIAGNOSIS — M5410 Radiculopathy, site unspecified: Secondary | ICD-10-CM

## 2016-04-30 DIAGNOSIS — Z981 Arthrodesis status: Secondary | ICD-10-CM

## 2016-05-05 ENCOUNTER — Telehealth: Payer: Self-pay | Admitting: Orthopedic Surgery

## 2016-05-05 NOTE — Telephone Encounter (Signed)
Patient called - states has appointment scheduled 05/21/16 at [facility]* *She is asking if Dr Aline Brochure would prescribe something to help with her pain until that time.  Ph# [phone]

## 2016-05-05 NOTE — Telephone Encounter (Signed)
Patient called regarding her referral to [facility].  Please call her at [phone]

## 2016-05-06 NOTE — Telephone Encounter (Signed)
Medication approved

## 2016-05-06 NOTE — Telephone Encounter (Signed)
PATIENT SCHEDULED FOR 05/21/16 WITH [facility]

## 2016-05-06 NOTE — Telephone Encounter (Signed)
Gabapentin 300 tid  ultracet q4

## 2016-05-07 ENCOUNTER — Other Ambulatory Visit: Payer: Self-pay | Admitting: *Deleted

## 2016-05-07 MED ORDER — GABAPENTIN 300 MG PO CAPS: 300.0000 mg | ORAL_CAPSULE | Freq: Three times a day (TID) | ORAL | 0 refills | Status: DC

## 2016-05-07 MED ORDER — TRAMADOL-ACETAMINOPHEN 37.5-325 MG PO TABS: 1.0000 | ORAL_TABLET | ORAL | 0 refills | Status: DC | PRN

## 2016-05-07 NOTE — Telephone Encounter (Signed)
Medication sent, patient aware.  

## 2016-05-21 DIAGNOSIS — M5416 Radiculopathy, lumbar region: Secondary | ICD-10-CM | POA: Diagnosis not present

## 2016-06-02 ENCOUNTER — Encounter (HOSPITAL_COMMUNITY): Payer: Medicare Other | Attending: Oncology | Admitting: Oncology

## 2016-06-02 ENCOUNTER — Encounter (HOSPITAL_COMMUNITY): Payer: Self-pay | Admitting: Oncology

## 2016-06-02 ENCOUNTER — Encounter (HOSPITAL_COMMUNITY): Payer: Medicare Other

## 2016-06-02 VITALS — BP 130/73 | HR 66 | Temp 98.3°F | Resp 16 | Wt 227.0 lb

## 2016-06-02 DIAGNOSIS — Z23 Encounter for immunization: Secondary | ICD-10-CM

## 2016-06-02 DIAGNOSIS — C50411 Malignant neoplasm of upper-outer quadrant of right female breast: Secondary | ICD-10-CM

## 2016-06-02 DIAGNOSIS — N2889 Other specified disorders of kidney and ureter: Secondary | ICD-10-CM | POA: Insufficient documentation

## 2016-06-02 DIAGNOSIS — Z79810 Long term (current) use of selective estrogen receptor modulators (SERMs): Secondary | ICD-10-CM | POA: Diagnosis not present

## 2016-06-02 DIAGNOSIS — C641 Malignant neoplasm of right kidney, except renal pelvis: Secondary | ICD-10-CM

## 2016-06-02 DIAGNOSIS — E876 Hypokalemia: Secondary | ICD-10-CM | POA: Insufficient documentation

## 2016-06-02 DIAGNOSIS — Z0000 Encounter for general adult medical examination without abnormal findings: Secondary | ICD-10-CM

## 2016-06-02 DIAGNOSIS — T50905A Adverse effect of unspecified drugs, medicaments and biological substances, initial encounter: Secondary | ICD-10-CM

## 2016-06-02 DIAGNOSIS — D0501 Lobular carcinoma in situ of right breast: Secondary | ICD-10-CM | POA: Diagnosis not present

## 2016-06-02 DIAGNOSIS — C649 Malignant neoplasm of unspecified kidney, except renal pelvis: Secondary | ICD-10-CM | POA: Diagnosis not present

## 2016-06-02 DIAGNOSIS — D0511 Intraductal carcinoma in situ of right breast: Secondary | ICD-10-CM | POA: Diagnosis not present

## 2016-06-02 LAB — CBC WITH DIFFERENTIAL/PLATELET
Basophils Absolute: 0 10*3/uL (ref 0.0–0.1)
Basophils Relative: 0 %
Eosinophils Absolute: 0.2 10*3/uL (ref 0.0–0.7)
Eosinophils Relative: 2 %
HCT: 37.7 % (ref 36.0–46.0)
Hemoglobin: 12.9 g/dL (ref 12.0–15.0)
Lymphocytes Relative: 33 %
Lymphs Abs: 3.1 10*3/uL (ref 0.7–4.0)
MCH: 32 pg (ref 26.0–34.0)
MCHC: 34.2 g/dL (ref 30.0–36.0)
MCV: 93.5 fL (ref 78.0–100.0)
Monocytes Absolute: 0.7 10*3/uL (ref 0.1–1.0)
Monocytes Relative: 8 %
Neutro Abs: 5.2 10*3/uL (ref 1.7–7.7)
Neutrophils Relative %: 57 %
Platelets: 212 10*3/uL (ref 150–400)
RBC: 4.03 MIL/uL (ref 3.87–5.11)
RDW: 13.2 % (ref 11.5–15.5)
WBC: 9.2 10*3/uL (ref 4.0–10.5)

## 2016-06-02 LAB — COMPREHENSIVE METABOLIC PANEL
ALT: 30 U/L (ref 14–54)
AST: 37 U/L (ref 15–41)
Albumin: 3.7 g/dL (ref 3.5–5.0)
Alkaline Phosphatase: 50 U/L (ref 38–126)
Anion gap: 9 (ref 5–15)
BUN: 5 mg/dL — ABNORMAL LOW (ref 6–20)
CO2: 26 mmol/L (ref 22–32)
Calcium: 8.8 mg/dL — ABNORMAL LOW (ref 8.9–10.3)
Chloride: 101 mmol/L (ref 101–111)
Creatinine, Ser: 0.75 mg/dL (ref 0.44–1.00)
GFR calc Af Amer: >60 mL/min (ref >60)
GFR calc non Af Amer: >60 mL/min (ref >60)
Glucose, Bld: 111 mg/dL — ABNORMAL HIGH (ref 65–99)
Potassium: 3.4 mmol/L — ABNORMAL LOW (ref 3.5–5.1)
Sodium: 136 mmol/L (ref 135–145)
Total Bilirubin: 0.2 mg/dL — ABNORMAL LOW (ref 0.3–1.2)
Total Protein: 7.1 g/dL (ref 6.5–8.1)

## 2016-06-02 MED ORDER — POTASSIUM CHLORIDE CRYS ER 20 MEQ PO TBCR: 20.0000 meq | EXTENDED_RELEASE_TABLET | Freq: Every day | ORAL | 0 refills | Status: DC

## 2016-06-02 MED ORDER — INFLUENZA VAC SPLIT QUAD 0.5 ML IM SUSY
0.5000 mL | PREFILLED_SYRINGE | Freq: Once | INTRAMUSCULAR | Status: AC
  Administered 2016-06-02: 0.5 mL via INTRAMUSCULAR
  Filled 2016-06-02: qty 0.5

## 2016-06-02 NOTE — Progress Notes (Signed)
Pt was given a flu shot in the left deltoid without complications. Pt was given vaccination information and she verbalized understanding.

## 2016-06-02 NOTE — Assessment & Plan Note (Addendum)
Extensive DCIS/LCIS of right breast, S/P simple right mastectomy and SLN which was negative for metastatic disease on 11/20/2014 by Dr. Brantley Stage.  Beginning Tamoxifen daily as chemoprevention on 01/01/2015.   Labs today: CBC diff, CMET.  I personally reviewed and went over laboratory results with the patient.  The results are noted within this dictation.  Velcade anemia is noted and likely from her antihypertensive medication containing hydrochlorothiazide. K-Dur 20 mEq E-scribed for 2 weeks. Future refills and management of hydrochlorothiazide induced hypokalemia will be deferred to her primary care provider.  I personally reviewed and went over radiographic studies with the patient.  The results are noted within this dictation.  Mammogram is OVERDUE.  Order is placed for mammogram.  Labs in 4 months: CBC diff, CMET.  Referral to survivorship program.  Return in 4 months for follow-up.  Influenza vaccine provided today.

## 2016-06-02 NOTE — Patient Instructions (Signed)
[place] at [facility] Discharge Instructions  RECOMMENDATIONS MADE BY THE CONSULTANT AND ANY TEST RESULTS WILL BE SENT TO YOUR REFERRING PHYSICIAN.  You were seen by Sara [name] today. Mammogram this month Return for follow up and labs in 4 months Survivorship Clinic referral Prescription for [place] given Flu shot given  Thank you for choosing [place] at [facility] to provide your oncology and hematology care.  To afford each patient quality time with our provider, please arrive at least 15 minutes before your scheduled appointment time.   Beginning January 23rd 2017 lab work for the [facility] will be done in the  Main lab at [facility] on 1st floor. If you have a lab appointment with the [facility] please come in thru the  Main Entrance and check in at the main information desk  You need to re-schedule your appointment should you arrive 10 or more minutes late.  We strive to give you quality time with our providers, and arriving late affects you and other patients whose appointments are after yours.  Also, if you no show three or more times for appointments you may be dismissed from the clinic at the providers discretion.     Again, thank you for choosing [facility].  Our hope is that these requests will decrease the amount of time that you wait before being seen by our physicians.       _____________________________________________________________  Should you have questions after your visit to [facility], please contact our office at [phone] between the hours of 8:30 a.m. and 4:30 p.m.  Voicemails left after 4:30 p.m. will not be returned until the following business day.  For prescription refill requests, have your pharmacy contact our office.         Resources For Cancer Patients and their Caregivers ? American Cancer Society: Can assist with transportation, wigs, general needs, runs Look Good  Feel Better.        (501) 791-5831 ? Cancer Care: Provides financial assistance, online support groups, medication/co-pay assistance.  1-800-813-HOPE 6066640377) ? [place] Assists [place] cancer patients and their families through emotional , educational and financial support.  (319) 086-7388 ? [place] DSS Where to apply for food stamps, Medicaid and utility assistance. (913)017-3622 ? [facility]: Transportation to medical appointments. 336-365-7886 ? Social Security Administration: May apply for disability if have a Stage IV cancer. 215-763-1786 (828) 422-0580 ? [employer], Disability and Transit Services: Assists with nutrition, care and transit needs. [place] Support Programs: @10RELATIVEDAYS @ > Cancer Support Group  2nd Tuesday of the month 1pm-2pm, Journey Room  > Creative Journey  3rd Tuesday of the month 1130am-1pm, Journey Room  > Look Good Feel Better  1st Wednesday of the month 10am-12 noon, Journey Room (Call [place] to register [phone])

## 2016-06-02 NOTE — Assessment & Plan Note (Signed)
Renal Cell Carcinoma ([id number]), S/P robotic assisted laparoscopic partial nephrectomy by Dr. Tresa Moore on 01/08/2016.

## 2016-06-02 NOTE — Progress Notes (Signed)
Maggie Font, MD [address] [place] [address]  Malignant neoplasm of upper-outer quadrant of right female breast [facility]) - Plan: MM DIAG BREAST TOMO UNI LEFT  Preventative health care - Plan: Influenza vac split quadrivalent PF (FLUARIX) injection 0.5 mL  Renal cell carcinoma, right (HCC)  Drug-induced hypokalemia - Plan: potassium chloride SA (K-DUR,KLOR-CON) 20 MEQ tablet  CURRENT THERAPY: Tamoxifen daily beginning on 01/01/2015  INTERVAL HISTORY: Sara [name] 70 y.o. female returns for followup of extensive DCIS/LCIS of right breast, S/P simple right mastectomy and SLN which was negative for metastatic disease on 11/20/2014 by Dr. Brantley Stage.      Malignant neoplasm of upper outer quadrant of female breast ([place])   09/05/2014 Imaging    assymetry noted in R breast on screening mammogram      09/18/2014 Imaging    Irregular mass in the 12 o'clock location of the right breast. Tissue diagnosis is recommended.      09/18/2014 Imaging    IMPRESSION: Irregular mass in the 12 o'clock location of the right breast. Tissue diagnosis is recommended.      10/09/2014 Imaging    8 x 12 x 8 CM abnormal linear clumped enhancement throughout the majority of the central and upper right breast.      10/17/2014 Initial Biopsy    Breast, right, needle core biopsy, central - DUCTAL CARCINOMA IN SITU, MIXED PATTERNS (CRIBRIFORM, COMEDO AND PAPILLARY), INVOLVING MULTIPLE CORES, AT LEAST 9 MM IN MAXIMAL EXTENT IN ANY ONE CORE. - FOCAL LOBULAR NEOPLASIA (ALH/LCIS). - NO INVASIVE CAR      11/20/2014 Definitive Surgery    Breast, simple mastectomy, right - DUCTAL CARCINOMA IN SITU, SEE COMMENT. - LOBULAR CARCINOMA IN SITU (CONVENTIONAL AND PLEOMORPHIC TYPES). - PREVIOUS BIOPSY SITES (X2). - IN SITU CARCINOMA IS 3CM FROM NEAREST MARGIN (DEEP).      01/01/2015 -  Anti-estrogen oral therapy    Tamoxifen daily.       Renal cell carcinoma ([place])   01/08/2016 Procedure   Robotic-assisted laparoscopic right partial nephrectomy by Dr. Tresa Moore.      01/08/2016 Pathology Results    Kidney, wedge excision / partial resection, right renal mass RENAL CELL CARCINOMA, CLEAR CELL TYPE, WHO NUCLEAR GRADE 2 (2.4 CM) THE TUMOR IS CONFINED TO THE KIDNEY MARGINS OF RESECTION ARE NEGATIVE FOR TUMOR       She is tolerating tamoxifen therapy well. She denies any hot flashes, arthralgias, myalgias, vaginal bleeding, and signs/symptoms of VTE. She admits to compliance with this medication as well.  She denies any new/acute complaints.  She is overdue for her mammogram and we will address that today.  Review of Systems  Constitutional: Negative.  Negative for chills, fever and weight loss.  HENT: Negative.   Eyes: Negative.  Negative for blurred vision and double vision.  Respiratory: Negative.  Negative for cough, hemoptysis and shortness of breath.   Cardiovascular: Negative.  Negative for chest pain.  Gastrointestinal: Negative.  Negative for nausea and vomiting.  Genitourinary: Negative.   Musculoskeletal: Negative.  Negative for joint pain and myalgias.  Skin: Negative.   Neurological: Negative.  Negative for weakness and headaches.  Endo/Heme/Allergies: Negative.   Psychiatric/Behavioral: Negative.      Past Medical History:  Diagnosis Date  . Allergic rhinitis due to pollen   . Anxiety   . Arthritis    "knees, back, right elbow; left shoulder; right ankle" (11/20/2014)  . Breast cancer, right breast ([place])    "  DCIS; zero stage" S/P mastectomy 11/20/2014  . Cervical spondylosis without myelopathy    all over  . Cervicalgia   . Chronic lower back pain   . Constipation    takes Miralax daily  . Degeneration of cervical intervertebral disc   . Depression   . Diverticulosis of colon (without mention of hemorrhage)   . GERD (gastroesophageal reflux disease)    takes Omeprazole daily  . Graves' disease    "took a pill to correct"  . H/O hiatal hernia   .  H/O urinary frequency   . Headache(784.0)    denies migraines since the 80's but has occ and takes Butalbital prn  . HTN (hypertension)    takes Metoprolol and Lisinopril daily  . Hypertension   . Insomnia    takes Ativan nigtly  . Joint pain    "all of them"  . Joint swelling    "knees, legs, ankles sometimes" (11/20/2014)  . Morbid obesity ([place])   . Numbness    LOWER LEGS  . Other postablative hypothyroidism    takes SYnthroid daily  . Palpitations   . Peptic ulcer, unspecified site, unspecified as acute or chronic, without mention of hemorrhage, perforation, or obstruction   . Primary localized osteoarthrosis, lower leg   . Pure hypercholesterolemia    takes Pravastin daily  . Recurrent UTI   . Renal cell carcinoma ([place]) 01/08/2016  . Renal mass    RIGHT  . Shortness of breath   . Sleep apnea    slight but doesn't require a CPAP (11/20/2014)  . Tendonitis of knee    bilateral    has HYPOTHYROIDISM; HYPERLIPIDEMIA; OBESITY NOS; MORBID OBESITY; BENIGN POSITIONAL VERTIGO; HYPERTENSION; ALLERGIC RHINITIS, SEASONAL; GERD; PUD; DIVERTICULOSIS, COLON; OVERACTIVE BLADDER; FIBROCYSTIC BREAST DISEASE; OSTEOARTHRITIS; KNEE, ARTHRITIS, DEGEN./OSTEO; H N P-LUMBAR; SPINAL STENOSIS, CERVICAL; SPINAL STENOSIS, LUMBAR; BACK PAIN; ANSERINE BURSITIS; Patellar tendinitis; PALPITATIONS; CHEST PAIN, RECURRENT; OTHER DYSPHAGIA; TEAR LATERAL MENISCUS; LIVER FUNCTION TESTS, ABNORMAL, HX OF; INTERMITTENT VERTIGO; GERD (gastroesophageal reflux disease); Dysphagia; RUQ pain; Fatty liver; Hematochezia; OA (osteoarthritis) of knee; Patellar tendonitis; Chest pain; Radicular pain of right lower extremity; Knee instability; Right knee pain; S/P arthroscopy of right knee; Abnormality of gait; Difficulty in walking(719.7); Postoperative stiffness of total knee replacement ([place]); Pain in joint, lower leg; S/P total knee replacement; Malignant neoplasm of upper outer quadrant of female breast ([place]); Right kidney mass;  Liver lesion; and Renal cell carcinoma (HCC) on her problem list.     is allergic to propoxyphene hcl; pollen extract; camphor; dust mite extract; latex; molds & smuts; and tomato.  We administered Influenza vac split quadrivalent PF.  Past Surgical History:  Procedure Laterality Date  . ABDOMINAL HYSTERECTOMY    . ANTERIOR LAT LUMBAR FUSION  05/13/2012   Procedure: ANTERIOR LATERAL LUMBAR FUSION 2 LEVELS;  Surgeon: Faythe Ghee, MD;  Location: [facility];  Service: Neurosurgery;  Laterality: Left;  Left Lumbar Three-four,Lumbar four-five Extreme Lumbar Interbody Fusion with Percutaneous Pedicle Screws  . APPENDECTOMY    . BREAST BIOPSY Left ~ 2014  . BREAST BIOPSY Right 2015  . BREAST LUMPECTOMY Left 1988  . BREAST RECONSTRUCTION WITH PLACEMENT OF TISSUE EXPANDER AND FLEX HD (ACELLULAR HYDRATED DERMIS) Right 11/20/2014  . BREAST RECONSTRUCTION WITH PLACEMENT OF TISSUE EXPANDER AND FLEX HD (ACELLULAR HYDRATED DERMIS) Right 11/20/2014   Procedure: RIGHT BREAST RECONSTRUCTION WITH PLACEMENT OF TISSUE EXPANDER AND ACELLULAR DERMA MATRIX (ACELLULAR DERMA MATRIX);  Surgeon: Crissie Reese, MD;  Location: [facility];  Service: Plastics;  Laterality: Right;  .  CATARACT EXTRACTION W/ INTRAOCULAR LENS  IMPLANT, BILATERAL Bilateral   . COLONOSCOPY  Sept 2008   RMR: normal rectum, left-sided diverticula, repeat in 2018  . DIAGNOSTIC LAPAROSCOPY    . DILATION AND CURETTAGE OF UTERUS    . ESOPHAGOGASTRODUODENOSCOPY    . ESOPHAGOGASTRODUODENOSCOPY  2013   Dr. Gala Romney: Cervical esophageal web and Schatzki's ring s/p dilation, small hiatal hernia, antral and duodenal erosions likely NSAID effect, chronic duodenitis on path   . FOREIGN BODY REMOVAL Right 10/06/2013   Procedure: REMOVAL FOREIGN BODY EXTREMITY;  Surgeon: Carole Civil, MD;  Location: [facility];  Service: Orthopedics;  Laterality: Right;  . INJECTION KNEE Left 10/06/2013   Procedure: KNEE INJECTION;  Surgeon: Carole Civil, MD;  Location: [facility];  Service: Orthopedics;  Laterality: Left;  . JOINT REPLACEMENT    . KNEE ARTHROSCOPY Right   . KNEE ARTHROSCOPY WITH LATERAL MENISECTOMY Right 10/06/2013   Procedure: KNEE ARTHROSCOPY WITH LATERAL AND MEDIAL MENISECTOMY;  Surgeon: Carole Civil, MD;  Location: [facility];  Service: Orthopedics;  Laterality: Right;  END @ 1234  . lumpectomy on pelvis     "mass of nerves"  . MASTECTOMY COMPLETE / SIMPLE W/ SENTINEL NODE BIOPSY Right 11/20/2014   axillary  . REMOVAL OF TISSUE EXPANDER AND PLACEMENT OF IMPLANT Right 12/13/2014   Procedure: REMOVAL OF RIGHT BREAST TISSUE EXPANDER;  Surgeon: Crissie Reese, MD;  Location: [facility];  Service: Plastics;  Laterality: Right;  . ROBOTIC ASSITED PARTIAL NEPHRECTOMY Right 01/08/2016   Procedure: XI ROBOTIC ASSITED PARTIAL NEPHRECTOMY;  Surgeon: Alexis Frock, MD;  Location: [facility];  Service: Urology;  Laterality: Right;  . SIMPLE MASTECTOMY WITH AXILLARY SENTINEL NODE BIOPSY Right 11/20/2014   Procedure: SIMPLE MASTECTOMY WITH AXILLARY SENTINEL NODE BIOPSY;  Surgeon: Erroll Luna, MD;  Location: [facility];  Service: General;  Laterality: Right;  . TISSUE EXPANDER REMOVAL Right 12/13/2014   dr Harlow Mares  . TONSILLECTOMY    . TOTAL KNEE ARTHROPLASTY Right 01/24/2014   Procedure: TOTAL KNEE ARTHROPLASTY;  Surgeon: Carole Civil, MD;  Location: [facility];  Service: Orthopedics;  Laterality: Right;  . TOTAL KNEE ARTHROPLASTY Left 08/08/2014   Procedure: TOTAL KNEE ARTHROPLASTY;  Surgeon: Carole Civil, MD;  Location: [facility];  Service: Orthopedics;  Laterality: Left;  . TUBAL LIGATION    . UPPER GASTROINTESTINAL ENDOSCOPY       PHYSICAL EXAMINATION  ECOG PERFORMANCE STATUS: 0 - Asymptomatic  Vitals:   06/02/16 1400  BP: 130/73  Pulse: 66  Resp: 16  Temp: 98.3 F (36.8 C)    GENERAL:alert, healthy, no distress, well nourished, well developed, comfortable, cooperative, smiling and accompanied by husband SKIN: skin color, texture, turgor are normal, no  rashes or significant lesions HEAD: Normocephalic, No masses, lesions, tenderness or abnormalities EYES: normal, PERRLA, EOMI, Conjunctiva are pink and non-injected EARS: External ears normal OROPHARYNX:lips, buccal mucosa, and tongue normal NECK: supple, no adenopathy, thyroid normal size, non-tender, without nodularity, no stridor, non-tender, trachea midline LYMPH:  no palpable lymphadenopathy BREAST:left breast normal without mass, skin or nipple changes or axillary nodes, right post-mastectomy site well healed and free of suspicious changes LUNGS: clear to auscultation and percussionWithout wheezes, rales, or rhonchi. HEART: regular rate & rhythm, no murmurs, no gallops, S1 normal and S2 normal ABDOMEN:abdomen soft, non-tender, obese, normal bowel sounds and no masses or organomegaly BACK: Back symmetric, no curvature. EXTREMITIES:less then 2 second capillary refill, no joint deformities, effusion, or inflammation, no skin discoloration, no clubbing, no cyanosis  NEURO:  alert & oriented x 3 with fluent speech, no focal motor/sensory deficits, gait normal   LABORATORY DATA: CBC    Component Value Date/Time   WBC 9.2 06/02/2016 1341   RBC 4.03 06/02/2016 1341   HGB 12.9 06/02/2016 1341   HCT 37.7 06/02/2016 1341   PLT 212 06/02/2016 1341   MCV 93.5 06/02/2016 1341   MCH 32.0 06/02/2016 1341   MCHC 34.2 06/02/2016 1341   RDW 13.2 06/02/2016 1341   LYMPHSABS 3.1 06/02/2016 1341   MONOABS 0.7 06/02/2016 1341   EOSABS 0.2 06/02/2016 1341   BASOSABS 0.0 06/02/2016 1341      Chemistry      Component Value Date/Time   NA 136 06/02/2016 1341   NA 142 08/19/2011 0826   K 3.4 (L) 06/02/2016 1341   K 4.2 08/19/2011 0826   CL 101 06/02/2016 1341   CL 101 08/19/2011 0826   CO2 26 06/02/2016 1341   BUN 5 (L) 06/02/2016 1341   CREATININE 0.75 06/02/2016 1341      Component Value Date/Time   CALCIUM 8.8 (L) 06/02/2016 1341   CALCIUM 9.4 08/19/2011 0826   ALKPHOS 50 06/02/2016  1341   ALKPHOS 98 08/19/2011 0826   AST 37 06/02/2016 1341   AST 30 08/19/2011 0826   ALT 30 06/02/2016 1341   BILITOT 0.2 (L) 06/02/2016 1341   BILITOT 0.5 05/13/2011 0831        ASSESSMENT AND PLAN:  Malignant neoplasm of upper outer quadrant of female breast Extensive DCIS/LCIS of right breast, S/P simple right mastectomy and SLN which was negative for metastatic disease on 11/20/2014 by Dr. Brantley Stage.  Beginning Tamoxifen daily as chemoprevention on 01/01/2015.   Labs today: CBC diff, CMET.  I personally reviewed and went over laboratory results with the patient.  The results are noted within this dictation.  Velcade anemia is noted and likely from her antihypertensive medication containing hydrochlorothiazide. K-Dur 20 mEq E-scribed for 2 weeks. Future refills and management of hydrochlorothiazide induced hypokalemia will be deferred to her primary care provider.  I personally reviewed and went over radiographic studies with the patient.  The results are noted within this dictation.  Mammogram is OVERDUE.  Order is placed for mammogram.  Labs in 4 months: CBC diff, CMET.  Referral to survivorship program.  Return in 4 months for follow-up.  Influenza vaccine provided today.  Renal cell carcinoma ([place]) Renal Cell Carcinoma ([id number]), S/P robotic assisted laparoscopic partial nephrectomy by Dr. Tresa Moore on 01/08/2016.   THERAPY PLAN:  Risk reduction therapy for ipsilateral breast folling breast-conserving surgery (2.2017):  1. Consider endocrine therapy for 5 years for:   A. Patients treated with breast conserving therapy (lumpectomy) and radiation therapy (category 1), especially for those with ER positive DCIS.   B. Benefit of endocrine therapy for ER negative DCIS is uncertain.   C. Patients treated with excision alone.  2. Endocrine therapy:   A. Tamoxifen for premenopausal patients.   B. Tamoxifen or aromatase inhibitor for postmenopausal patients with some advantage for  aromatase inhibitor therapy in patients less than 60 years old or with concerns for thromboembolism  3. Risk reduction therapy for contralateral breast:   A. Counseling regarding risk reduction. See NCCN guidelines for breast cancer risk reduction.  NCCN guidelines recommends monitoring for the following for those patients on Tamoxifen/Raloxifene Therapy:  A. Asymptomatic   1. Continue risk-reduction agent   2. Continue follow-up  B. Hot-flashes or other risk-reduction, agent related symptoms   1. Symptomatic treatment.  2. If persists, re-evaluate role of risk-reduction agent   3. Continue risk-reduction agent- Continue follow-up  C. Abnormal vaginal bleeding   1. Prompt evaluation for endometrial cancer if uterus intact    A. If endometrial pathology found, re-initiation of Tamoxifen may be considered after hysterectomy if early-stage disease     1. Continue follow-up    B. If no endometrial pathology (carcinoma or hyperplasia with or without atypia) found, continue Tamoxifen and re-evaluate if symptoms persist or recur.     1. Continue follow-up  D. Anticipated elective surgery   1. Consider discontinuing Tamoxifen or Raloxifene prior to elective surgery   2. Resume Tamoxifen or Raloxifene postoperatively when ambulation is normal  E. Deep vein thrombosis, pulmonary embolism, cerebrovascular accident, or prolonged immobilization   1. Discontinue tamoxifen or raloxifene, treat underlying condition.    All questions were answered. The patient knows to call the clinic with any problems, questions or concerns. We can certainly see the patient much sooner if necessary.  Patient and plan discussed with Dr. Ancil Linsey and she is in agreement with the aforementioned.   This note is electronically signed by: Robynn Pane 06/02/2016 10:03 PM

## 2016-06-03 ENCOUNTER — Other Ambulatory Visit (HOSPITAL_COMMUNITY): Payer: Self-pay | Admitting: Emergency Medicine

## 2016-06-04 ENCOUNTER — Other Ambulatory Visit (HOSPITAL_COMMUNITY): Payer: Self-pay | Admitting: Family Medicine

## 2016-06-04 DIAGNOSIS — Z1231 Encounter for screening mammogram for malignant neoplasm of breast: Secondary | ICD-10-CM

## 2016-06-09 ENCOUNTER — Encounter (HOSPITAL_COMMUNITY): Payer: Medicare Other

## 2016-06-11 ENCOUNTER — Other Ambulatory Visit: Payer: Self-pay | Admitting: Neurosurgery

## 2016-06-11 DIAGNOSIS — M5416 Radiculopathy, lumbar region: Secondary | ICD-10-CM

## 2016-06-17 DIAGNOSIS — E039 Hypothyroidism, unspecified: Secondary | ICD-10-CM | POA: Diagnosis not present

## 2016-06-17 DIAGNOSIS — I10 Essential (primary) hypertension: Secondary | ICD-10-CM | POA: Diagnosis not present

## 2016-06-17 DIAGNOSIS — N3281 Overactive bladder: Secondary | ICD-10-CM | POA: Diagnosis not present

## 2016-06-19 ENCOUNTER — Other Ambulatory Visit: Payer: Medicare Other

## 2016-06-21 ENCOUNTER — Ambulatory Visit
Admission: RE | Admit: 2016-06-21 | Discharge: 2016-06-21 | Disposition: A | Discharge status: Home / Self Care | Payer: Medicare Other | Source: Ambulatory Visit | Attending: Neurosurgery | Admitting: Neurosurgery

## 2016-06-21 DIAGNOSIS — M5416 Radiculopathy, lumbar region: Secondary | ICD-10-CM

## 2016-06-21 DIAGNOSIS — M5136 Other intervertebral disc degeneration, lumbar region: Secondary | ICD-10-CM | POA: Diagnosis not present

## 2016-06-21 MED ORDER — GADOBENATE DIMEGLUMINE 529 MG/ML IV SOLN
20.0000 mL | Freq: Once | INTRAVENOUS | Status: AC | PRN
  Administered 2016-06-21: 20 mL via INTRAVENOUS

## 2016-07-01 DIAGNOSIS — M5416 Radiculopathy, lumbar region: Secondary | ICD-10-CM | POA: Diagnosis not present

## 2016-07-07 ENCOUNTER — Encounter (HOSPITAL_COMMUNITY): Payer: Medicare Other | Attending: Oncology | Admitting: Adult Health

## 2016-07-07 DIAGNOSIS — N2889 Other specified disorders of kidney and ureter: Secondary | ICD-10-CM | POA: Insufficient documentation

## 2016-07-07 DIAGNOSIS — R600 Localized edema: Secondary | ICD-10-CM

## 2016-07-07 DIAGNOSIS — F419 Anxiety disorder, unspecified: Secondary | ICD-10-CM

## 2016-07-07 DIAGNOSIS — Z8553 Personal history of malignant neoplasm of renal pelvis: Secondary | ICD-10-CM

## 2016-07-07 DIAGNOSIS — R2231 Localized swelling, mass and lump, right upper limb: Secondary | ICD-10-CM

## 2016-07-07 DIAGNOSIS — Z170 Estrogen receptor positive status [ER+]: Secondary | ICD-10-CM

## 2016-07-07 DIAGNOSIS — F4323 Adjustment disorder with mixed anxiety and depressed mood: Secondary | ICD-10-CM

## 2016-07-07 DIAGNOSIS — C50411 Malignant neoplasm of upper-outer quadrant of right female breast: Secondary | ICD-10-CM

## 2016-07-07 DIAGNOSIS — F329 Major depressive disorder, single episode, unspecified: Secondary | ICD-10-CM

## 2016-07-07 DIAGNOSIS — E876 Hypokalemia: Secondary | ICD-10-CM | POA: Insufficient documentation

## 2016-07-07 DIAGNOSIS — F32A Depression, unspecified: Secondary | ICD-10-CM

## 2016-07-07 MED ORDER — SERTRALINE HCL 50 MG PO TABS: 50.0000 mg | ORAL_TABLET | Freq: Every day | ORAL | 3 refills | Status: DC

## 2016-07-07 NOTE — Patient Instructions (Addendum)
Start taking the Zoloft 1/2 tab for the first week (this will be 25 mg).  After that first week, then just start taking a whole tablet (50 mg).    We will work on getting your ultrasound of your right axilla (armpit) scheduled sometime within the next week or so.   Call us with any questions! Mike Craze, NP [place] [phone]

## 2016-07-07 NOTE — Progress Notes (Signed)
[place], [place] [id number]   CLINIC:  Survivorship  REASON FOR VISIT:  Long-term survivorship visit for breast & renal cell cancers   BRIEF ONCOLOGY HISTORY:    Malignant neoplasm of upper outer quadrant of female breast ([place])   09/05/2014 Imaging    assymetry noted in R breast on screening mammogram      09/18/2014 Imaging    Irregular mass in the 12 o'clock location of the right breast. Tissue diagnosis is recommended.      09/18/2014 Imaging    IMPRESSION: Irregular mass in the 12 o'clock location of the right breast. Tissue diagnosis is recommended.      10/09/2014 Imaging    8 x 12 x 8 CM abnormal linear clumped enhancement throughout the majority of the central and upper right breast.      10/17/2014 Initial Biopsy    Breast, right, needle core biopsy, central - DUCTAL CARCINOMA IN SITU, MIXED PATTERNS (CRIBRIFORM, COMEDO AND PAPILLARY), INVOLVING MULTIPLE CORES, AT LEAST 9 MM IN MAXIMAL EXTENT IN ANY ONE CORE. - FOCAL LOBULAR NEOPLASIA (ALH/LCIS). - NO INVASIVE CAR      11/20/2014 Definitive Surgery    Breast, simple mastectomy, right - DUCTAL CARCINOMA IN SITU, SEE COMMENT. - LOBULAR CARCINOMA IN SITU (CONVENTIONAL AND PLEOMORPHIC TYPES). - PREVIOUS BIOPSY SITES (X2). - IN SITU CARCINOMA IS 3CM FROM NEAREST MARGIN (DEEP).      01/01/2015 -  Anti-estrogen oral therapy    Tamoxifen daily.       Renal cell carcinoma ([place])   01/08/2016 Procedure    Robotic-assisted laparoscopic right partial nephrectomy by Dr. Tresa Moore.      01/08/2016 Pathology Results    Kidney, wedge excision / partial resection, right renal mass RENAL CELL CARCINOMA, CLEAR CELL TYPE, WHO NUCLEAR GRADE 2 (2.4 CM) THE TUMOR IS CONFINED TO THE KIDNEY MARGINS OF RESECTION ARE NEGATIVE FOR TUMOR        INTERVAL HISTORY:  [name] presents to the survivorship clinic with her husband today. She completed treatment for right breast DCIS treated with  mastectomy in 11/2014. She started on tamoxifen in 12/2014, and continues to tolerate the medication well. She denies any hot flashes or vaginal bleeding. In 12/2017 she underwent right partial nephrectomy with Dr. Tresa Moore, which revealed grade 2 clear cell renal cell carcinoma.  She reports "phantom" right breast pain s/p mastectomy. She also reports having right arm and axillary swelling, which increases and decreases in size intermittently. She saw PET, and was talked lymphedema massage. She tells me that she does not have a compression sleeve, and wonders if she should get one.  She endorses back pain, and tells me that she has a "slipped disc", for which she is having evaluation with the neurosurgeon at [facility] in [place]. She is scheduled to have an injection in her spine next week.    She tells me that she has been fatigued lately, "with no motivation or drive to do any projects." She has had more anxiety lately. Dr. Berdine Addison, her PCP, increased her Ativan to 1 mg as needed about 1 month ago. She tells me it does help her a little bit, but she still gets anxious quite a bit. She is not sleeping well; she tells me that her "mind races" at night.  She is looking forward to going on a cruise to the [place] soon.   ADDITIONAL REVIEW OF SYSTEMS:  Review of Systems  Constitutional: Positive for malaise/fatigue.  HENT:  Negative.   Respiratory: Negative.   Cardiovascular: Negative.   Gastrointestinal: Negative.   Genitourinary: Negative.   Musculoskeletal: Negative.   Skin:       (R) axilla swelling that is uncomfortable for her at times   Neurological: Negative.   Psychiatric/Behavioral: Positive for depression. The patient is nervous/anxious and has insomnia.      PAST MEDICAL & SURGICAL HISTORY:  Past Medical History:  Diagnosis Date  . Allergic rhinitis due to pollen   . Anxiety   . Arthritis    "knees, back, right elbow; left shoulder; right ankle" (11/20/2014)  . Breast  cancer, right breast ([place])    "DCIS; zero stage" S/P mastectomy 11/20/2014  . Cervical spondylosis without myelopathy    all over  . Cervicalgia   . Chronic lower back pain   . Constipation    takes Miralax daily  . Degeneration of cervical intervertebral disc   . Depression   . Diverticulosis of colon (without mention of hemorrhage)   . GERD (gastroesophageal reflux disease)    takes Omeprazole daily  . Graves' disease    "took a pill to correct"  . H/O hiatal hernia   . H/O urinary frequency   . Headache(784.0)    denies migraines since the 80's but has occ and takes Butalbital prn  . HTN (hypertension)    takes Metoprolol and Lisinopril daily  . Hypertension   . Insomnia    takes Ativan nigtly  . Joint pain    "all of them"  . Joint swelling    "knees, legs, ankles sometimes" (11/20/2014)  . Morbid obesity ([place])   . Numbness    LOWER LEGS  . Other postablative hypothyroidism    takes SYnthroid daily  . Palpitations   . Peptic ulcer, unspecified site, unspecified as acute or chronic, without mention of hemorrhage, perforation, or obstruction   . Primary localized osteoarthrosis, lower leg   . Pure hypercholesterolemia    takes Pravastin daily  . Recurrent UTI   . Renal cell carcinoma ([place]) 01/08/2016  . Renal mass    RIGHT  . Shortness of breath   . Sleep apnea    slight but doesn't require a CPAP (11/20/2014)  . Tendonitis of knee    bilateral   Past Surgical History:  Procedure Laterality Date  . ABDOMINAL HYSTERECTOMY    . ANTERIOR LAT LUMBAR FUSION  05/13/2012   Procedure: ANTERIOR LATERAL LUMBAR FUSION 2 LEVELS;  Surgeon: Faythe Ghee, MD;  Location: [facility];  Service: Neurosurgery;  Laterality: Left;  Left Lumbar Three-four,Lumbar four-five Extreme Lumbar Interbody Fusion with Percutaneous Pedicle Screws  . APPENDECTOMY    . BREAST BIOPSY Left ~ 2014  . BREAST BIOPSY Right 2015  . BREAST LUMPECTOMY Left 1988  . BREAST RECONSTRUCTION WITH PLACEMENT OF  TISSUE EXPANDER AND FLEX HD (ACELLULAR HYDRATED DERMIS) Right 11/20/2014  . BREAST RECONSTRUCTION WITH PLACEMENT OF TISSUE EXPANDER AND FLEX HD (ACELLULAR HYDRATED DERMIS) Right 11/20/2014   Procedure: RIGHT BREAST RECONSTRUCTION WITH PLACEMENT OF TISSUE EXPANDER AND ACELLULAR DERMA MATRIX (ACELLULAR DERMA MATRIX);  Surgeon: Crissie Reese, MD;  Location: [facility];  Service: Plastics;  Laterality: Right;  . CATARACT EXTRACTION W/ INTRAOCULAR LENS  IMPLANT, BILATERAL Bilateral   . COLONOSCOPY  Sept 2008   RMR: normal rectum, left-sided diverticula, repeat in 2018  . DIAGNOSTIC LAPAROSCOPY    . DILATION AND CURETTAGE OF UTERUS    . ESOPHAGOGASTRODUODENOSCOPY    . ESOPHAGOGASTRODUODENOSCOPY  2013   Dr. Gala Romney: Cervical esophageal  web and Schatzki's ring s/p dilation, small hiatal hernia, antral and duodenal erosions likely NSAID effect, chronic duodenitis on path   . FOREIGN BODY REMOVAL Right 10/06/2013   Procedure: REMOVAL FOREIGN BODY EXTREMITY;  Surgeon: Carole Civil, MD;  Location: [facility];  Service: Orthopedics;  Laterality: Right;  . INJECTION KNEE Left 10/06/2013   Procedure: KNEE INJECTION;  Surgeon: Carole Civil, MD;  Location: [facility];  Service: Orthopedics;  Laterality: Left;  . JOINT REPLACEMENT    . KNEE ARTHROSCOPY Right   . KNEE ARTHROSCOPY WITH LATERAL MENISECTOMY Right 10/06/2013   Procedure: KNEE ARTHROSCOPY WITH LATERAL AND MEDIAL MENISECTOMY;  Surgeon: Carole Civil, MD;  Location: [facility];  Service: Orthopedics;  Laterality: Right;  END @ 1234  . lumpectomy on pelvis     "mass of nerves"  . MASTECTOMY COMPLETE / SIMPLE W/ SENTINEL NODE BIOPSY Right 11/20/2014   axillary  . REMOVAL OF TISSUE EXPANDER AND PLACEMENT OF IMPLANT Right 12/13/2014   Procedure: REMOVAL OF RIGHT BREAST TISSUE EXPANDER;  Surgeon: Crissie Reese, MD;  Location: [facility];  Service: Plastics;  Laterality: Right;  . ROBOTIC ASSITED PARTIAL NEPHRECTOMY Right 01/08/2016   Procedure: XI ROBOTIC ASSITED PARTIAL  NEPHRECTOMY;  Surgeon: Alexis Frock, MD;  Location: [facility];  Service: Urology;  Laterality: Right;  . SIMPLE MASTECTOMY WITH AXILLARY SENTINEL NODE BIOPSY Right 11/20/2014   Procedure: SIMPLE MASTECTOMY WITH AXILLARY SENTINEL NODE BIOPSY;  Surgeon: Erroll Luna, MD;  Location: [facility];  Service: General;  Laterality: Right;  . TISSUE EXPANDER REMOVAL Right 12/13/2014   dr Harlow Mares  . TONSILLECTOMY    . TOTAL KNEE ARTHROPLASTY Right 01/24/2014   Procedure: TOTAL KNEE ARTHROPLASTY;  Surgeon: Carole Civil, MD;  Location: [facility];  Service: Orthopedics;  Laterality: Right;  . TOTAL KNEE ARTHROPLASTY Left 08/08/2014   Procedure: TOTAL KNEE ARTHROPLASTY;  Surgeon: Carole Civil, MD;  Location: [facility];  Service: Orthopedics;  Laterality: Left;  . TUBAL LIGATION    . UPPER GASTROINTESTINAL ENDOSCOPY       SOCIAL HISTORY:  [name] is married to her husband of 12 years.  They have 8 children, 23 grandchildren, and 14 great-grandchildren. [name] and her husband are both disabled and unable to work.  She denies any tobacco or illicit drug use. She drinks alcohol socially.    CURRENT MEDICATIONS:  Current Outpatient Prescriptions on File Prior to Visit  Medication Sig Dispense Refill  . acetaminophen (TYLENOL) 500 MG tablet Take 1,000 mg by mouth every 6 (six) hours as needed for moderate pain.     Marland Kitchen aspirin 81 MG tablet Take 81 mg by mouth daily.    . diphenhydrAMINE (SOMINEX) 25 MG tablet Take 50 mg by mouth at bedtime as needed for allergies or sleep.    . fluticasone (FLONASE) 50 MCG/ACT nasal spray Place 2 sprays into the nose at bedtime.     . gabapentin (NEURONTIN) 300 MG capsule Take 1 capsule (300 mg total) by mouth 3 (three) times daily. 90 capsule 0  . levothyroxine (SYNTHROID, LEVOTHROID) 88 MCG tablet Take 88 mcg by mouth daily before breakfast.     . LORazepam (ATIVAN) 1 MG tablet Take 0.5 mg by mouth at bedtime as needed for sleep. For sleep    .  losartan-hydrochlorothiazide (HYZAAR) 100-12.5 MG per tablet Take 1 tablet by mouth daily.     . metoprolol tartrate (LOPRESSOR) 25 MG tablet Take 25 mg by mouth 2 (two) times daily.     Marland Kitchen  omeprazole (PRILOSEC) 20 MG capsule     . omeprazole (PRILOSEC) 40 MG capsule Take 40 mg by mouth daily before breakfast.     . oxybutynin (DITROPAN) 5 MG tablet Take 5 mg by mouth at bedtime. Reported on 01/30/2016    . pravastatin (PRAVACHOL) 40 MG tablet Take 40 mg by mouth every morning.     . tamoxifen (NOLVADEX) 20 MG tablet Take 1 tablet by mouth  daily 90 tablet 3  . [DISCONTINUED] Ranitidine HCl (ZANTAC 75 PO) Take by mouth.       No current facility-administered medications on file prior to visit.     ALLERGIES: Allergies  Allergen Reactions  . Propoxyphene Hcl Other (See Comments)     Hallucinations  . Pollen Extract Other (See Comments)    Sneezing, congestion  . Camphor Itching  . Dust Mite Extract Itching    Sneezing. Runny nose   . Latex Dermatitis and Rash  . Molds & Smuts Itching  . Tomato Hives    PHYSICAL EXAM:  General: Female in no acute distress.  Accompanied by her husband.   HEENT: Head is normocephalic. Conjunctivae clear without exudate.  Sclerae anicteric.  Breast Exam:  Left breast/axilla: No palpable masses. No skin redness, thickness, or peau d'orange appearance. No palpable adenopathy.  Right chest wall/axilla: s/p mastectomy; no palpable masses or nodularity to chest wall.  Edema noted to right axilla; area of edema is well-circumscribed and mobile, ? Seroma; no palpable firm mass to axillary region. No palpable adenopathy.  Cardiovascular: Normal rate and rhythm Respiratory: Clear to auscultation bilaterally. Chest expansion symmetric without accessory muscle use. Breathing non-labored.    GU: Deferred.   GI: Soft, non-tender abdomen. Normoactive bowel sounds.  Neuro: No focal deficits. Steady gait.   Psych: Normal mood and affect for situation. Extremities:  No edema.   Skin: Warm and dry.   LABORATORY DATA: None for this visit.   DIAGNOSTIC IMAGING:  None for this visit.    ASSESSMENT & PLAN:  Ms. Vought is a pleasant 70 y.o. female with history of Stage 0 right breast DCIS, diagnosed in 09/2014;  treated with right mastectomy and adjuvant anti-estrogen therapy with Tamoxifen beginning in 12/2014. In 12/2015, she underwent right partial nephrectomy for grade 2, clear cell renal cell carcinoma; no further treatment needed. Patient presents to survivorship clinic today for long-term survivorship visit and routine cancer surveillance.    1. Stage 0 right cancer:  Clinically, she is without evidence of disease recurrence based on physical exam.  There is some right axillary edema noted on exam, which is likely benign (see #3 below).  Ms. Mccleskey will return to [facility] for surveillance visit with Dr. Cindie Laroche in 09/2016.  She is welcome to return to the survivorship clinic at any time in the future, as needed.   2. History of grade 2 renal cell carcinoma: She continue her follow-up with Dr. Tresa Moore [facility]. She was last seen by him in 01/2016. She will be due for her next visit in 07/2016. Encouraged her to maintain adequate follow-up with her urologist and report any concerning symptoms to either our office or Dr. Zettie Pho office.    3. Right axillary edema: There is an area of well-circumscribed edema to the right axilla. This could represent a seroma, or simply be a collection of fatty tissue. There are no firm palpable masses or specific nodularity in this area on exam. However, the axillary edema is causing the patient discomfort and is  concerning for her. Therefore, I have ordered a right axillary ultrasound for further evaluation. I have low suspicion that this is related to her left non-invasive breast cancer, however if there is fluid collection there that could be drained to offer symptomatic relief, then this  would improve the patient's quality of life. She agrees to proceed with imaging & we will await the results of the axillary ultrasound.  4. Adjustment disorder with anxious and depressed mood: It is not uncommon for this period of the patient's cancer care trajectory to be one of many emotions and stressors.  We discussed that since her mood is affecting her day-to-day life, as well as her ability to get a good night's rest, she would be a good candidate for antidepressant therapy. We discussed starting Zoloft, which would be effective in managing both her depressive and anxiety symptoms over time. She could continue to use the Ativan 1 mg as needed for anxiety. We discussed possible side effects of Zoloft with initiation of SSRI therapy, including nausea, GI distress, and drowsiness. She is concerned about some of the side effects, so we discussed dose-escalation of the Zoloft, which she agreed to try. She will take Zoloft 25 mg (half tab) for the first week. She will then increase the dose to one tablet (50 mg) per day. Encouraged her to try to take the medication about the same time per day and to not heavily drink alcohol while taking SSRIs. She understands that we may need to further increase the dose to achieve maximum benefit. Ms. Wills was encouraged to take advantage of our support services programs and support groups to better cope in her new life as a cancer survivor after completing anti-cancer treatment. Today, I provided support through active listening, validation of concerns, and expressive supportive counseling.       Dispo:  -Ultrasound right axilla pending.  -Return to [facility] for follow-up in 09/2016 for continued surveillance.  -Continue follow-up with Dr. Tresa Moore at [facility].    A total of 35 minutes was spent in face-to-face care of this patient, with greater than 50% of that time spent in counseling and care coordination.   Mike Craze,  NP Survivorship Program [place] [phone]

## 2016-07-08 ENCOUNTER — Other Ambulatory Visit: Payer: Self-pay | Admitting: Orthopedic Surgery

## 2016-07-13 ENCOUNTER — Telehealth: Payer: Self-pay | Admitting: *Deleted

## 2016-07-13 NOTE — Telephone Encounter (Signed)
Called pt to f/u to see how new medication, Zoloft was working for her . Pt informed this nurse that she just now received med today in mail from [facility] and will start it tonight, first dose. Pt will take tablet nightly. I told pt I will call her next week to see how she tolerates it. Pt had no further questions. Message to be fwd to G.Dawson,NP.

## 2016-07-15 ENCOUNTER — Encounter (HOSPITAL_COMMUNITY): Payer: Self-pay | Admitting: Adult Health

## 2016-07-15 DIAGNOSIS — M5416 Radiculopathy, lumbar region: Secondary | ICD-10-CM | POA: Diagnosis not present

## 2016-07-20 ENCOUNTER — Telehealth: Payer: Self-pay | Admitting: *Deleted

## 2016-07-20 NOTE — Telephone Encounter (Signed)
Called pt to see how she was tolerating the Zoloft since starting on the 23rd. Pt said she is doing well and sleeping so much better. Pt said the first couple of days she had a slight headache and a little nausea but it was short-lived. No more problems since. She has had some constipation but is drinking her veggie smoothies and did agree that she would consider OTC stool softners. Message to be fwd to [employer].

## 2016-07-21 ENCOUNTER — Other Ambulatory Visit (HOSPITAL_COMMUNITY): Payer: Self-pay | Admitting: Adult Health

## 2016-07-21 ENCOUNTER — Ambulatory Visit (HOSPITAL_COMMUNITY)
Admission: RE | Admit: 2016-07-21 | Discharge: 2016-07-21 | Disposition: A | Discharge status: Home / Self Care | Payer: Medicare Other | Source: Ambulatory Visit | Attending: Adult Health | Admitting: Adult Health

## 2016-07-21 DIAGNOSIS — R229 Localized swelling, mass and lump, unspecified: Secondary | ICD-10-CM | POA: Diagnosis not present

## 2016-07-21 DIAGNOSIS — R2231 Localized swelling, mass and lump, right upper limb: Secondary | ICD-10-CM | POA: Diagnosis not present

## 2016-07-21 DIAGNOSIS — C50411 Malignant neoplasm of upper-outer quadrant of right female breast: Secondary | ICD-10-CM

## 2016-07-21 DIAGNOSIS — Z170 Estrogen receptor positive status [ER+]: Secondary | ICD-10-CM | POA: Diagnosis not present

## 2016-07-28 ENCOUNTER — Encounter (HOSPITAL_COMMUNITY): Payer: Self-pay

## 2016-07-28 ENCOUNTER — Other Ambulatory Visit (HOSPITAL_COMMUNITY): Payer: Self-pay | Admitting: Surgery

## 2016-07-28 ENCOUNTER — Ambulatory Visit (HOSPITAL_COMMUNITY)
Admission: RE | Admit: 2016-07-28 | Discharge: 2016-07-28 | Disposition: A | Discharge status: Home / Self Care | Payer: Medicare Other | Source: Ambulatory Visit | Attending: Adult Health | Admitting: Adult Health

## 2016-07-28 ENCOUNTER — Ambulatory Visit (HOSPITAL_COMMUNITY)
Admission: RE | Admit: 2016-07-28 | Discharge: 2016-07-28 | Disposition: A | Discharge status: Home / Self Care | Payer: Medicare Other | Source: Ambulatory Visit | Attending: Surgery | Admitting: Surgery

## 2016-07-28 DIAGNOSIS — R2231 Localized swelling, mass and lump, right upper limb: Secondary | ICD-10-CM | POA: Insufficient documentation

## 2016-07-28 DIAGNOSIS — N6310 Unspecified lump in the right breast, unspecified quadrant: Secondary | ICD-10-CM

## 2016-07-28 DIAGNOSIS — Z170 Estrogen receptor positive status [ER+]: Secondary | ICD-10-CM | POA: Insufficient documentation

## 2016-07-28 DIAGNOSIS — R590 Localized enlarged lymph nodes: Secondary | ICD-10-CM | POA: Diagnosis not present

## 2016-07-28 DIAGNOSIS — C50411 Malignant neoplasm of upper-outer quadrant of right female breast: Secondary | ICD-10-CM | POA: Insufficient documentation

## 2016-07-28 DIAGNOSIS — I898 Other specified noninfective disorders of lymphatic vessels and lymph nodes: Secondary | ICD-10-CM | POA: Diagnosis not present

## 2016-07-28 MED ORDER — LIDOCAINE-EPINEPHRINE (PF) 1 %-1:200000 IJ SOLN
INTRAMUSCULAR | Status: AC
  Filled 2016-07-28: qty 30

## 2016-07-28 MED ORDER — LIDOCAINE HCL (PF) 1 % IJ SOLN
INTRAMUSCULAR | Status: AC
  Filled 2016-07-28: qty 10

## 2016-07-29 ENCOUNTER — Ambulatory Visit: Payer: Medicare Other | Admitting: Orthopedic Surgery

## 2016-07-30 ENCOUNTER — Telehealth: Payer: Self-pay | Admitting: Adult Health

## 2016-07-30 NOTE — Telephone Encounter (Signed)
I called Ms. Duzan to give her the results of her recent axillary biopsy, which was negative for malignancy.  She states that someone called her this afternoon with this great news.    I asked how she was doing otherwise.  She said she is a little sore from the biopsy site, but otherwise feels really well.    When asked how she is doing on the Zoloft, she stated, "Oh! I am doing so much better on the Zoloft.  I didn't even know I needed something like this until I got started on it.  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    I shared with her that I am so happy that she is feeling better and the biopsy results were negative.  She is relieved and happy as well.  Encouraged her to call us with any other questions or concerns.  It is a joy to participate in her care!  Mike Craze, NP [facility] [phone]

## 2016-07-31 ENCOUNTER — Ambulatory Visit: Payer: Medicare Other | Admitting: Orthopedic Surgery

## 2016-08-05 ENCOUNTER — Other Ambulatory Visit: Payer: Self-pay | Admitting: Urology

## 2016-08-05 ENCOUNTER — Ambulatory Visit (HOSPITAL_COMMUNITY)
Admission: RE | Admit: 2016-08-05 | Discharge: 2016-08-05 | Disposition: A | Discharge status: Home / Self Care | Payer: Medicare Other | Source: Ambulatory Visit | Attending: Urology | Admitting: Urology

## 2016-08-05 DIAGNOSIS — K760 Fatty (change of) liver, not elsewhere classified: Secondary | ICD-10-CM | POA: Diagnosis not present

## 2016-08-05 DIAGNOSIS — C641 Malignant neoplasm of right kidney, except renal pelvis: Secondary | ICD-10-CM | POA: Insufficient documentation

## 2016-08-05 DIAGNOSIS — R079 Chest pain, unspecified: Secondary | ICD-10-CM | POA: Diagnosis not present

## 2016-08-05 DIAGNOSIS — C649 Malignant neoplasm of unspecified kidney, except renal pelvis: Secondary | ICD-10-CM | POA: Diagnosis not present

## 2016-09-07 ENCOUNTER — Ambulatory Visit (HOSPITAL_COMMUNITY): Payer: Medicare Other

## 2016-09-08 DIAGNOSIS — M5416 Radiculopathy, lumbar region: Secondary | ICD-10-CM | POA: Diagnosis not present

## 2016-09-10 ENCOUNTER — Ambulatory Visit (HOSPITAL_COMMUNITY)
Admission: RE | Admit: 2016-09-10 | Discharge: 2016-09-10 | Disposition: A | Discharge status: Home / Self Care | Payer: Medicare Other | Source: Ambulatory Visit | Attending: Family Medicine | Admitting: Family Medicine

## 2016-09-10 DIAGNOSIS — Z1231 Encounter for screening mammogram for malignant neoplasm of breast: Secondary | ICD-10-CM | POA: Insufficient documentation

## 2016-09-17 DIAGNOSIS — I10 Essential (primary) hypertension: Secondary | ICD-10-CM | POA: Diagnosis not present

## 2016-09-17 DIAGNOSIS — R51 Headache: Secondary | ICD-10-CM | POA: Diagnosis not present

## 2016-09-17 DIAGNOSIS — E039 Hypothyroidism, unspecified: Secondary | ICD-10-CM | POA: Diagnosis not present

## 2016-09-29 DIAGNOSIS — R3915 Urgency of urination: Secondary | ICD-10-CM | POA: Diagnosis not present

## 2016-09-29 DIAGNOSIS — Q625 Duplication of ureter: Secondary | ICD-10-CM | POA: Diagnosis not present

## 2016-09-29 DIAGNOSIS — C641 Malignant neoplasm of right kidney, except renal pelvis: Secondary | ICD-10-CM | POA: Diagnosis not present

## 2016-10-01 ENCOUNTER — Other Ambulatory Visit (HOSPITAL_COMMUNITY): Payer: Self-pay | Admitting: *Deleted

## 2016-10-01 DIAGNOSIS — C641 Malignant neoplasm of right kidney, except renal pelvis: Secondary | ICD-10-CM

## 2016-10-02 ENCOUNTER — Ambulatory Visit (HOSPITAL_COMMUNITY): Payer: Medicare Other | Admitting: Oncology

## 2016-10-02 ENCOUNTER — Other Ambulatory Visit (HOSPITAL_COMMUNITY): Payer: Medicare Other

## 2016-10-05 DIAGNOSIS — I10 Essential (primary) hypertension: Secondary | ICD-10-CM | POA: Diagnosis not present

## 2016-11-02 ENCOUNTER — Encounter (HOSPITAL_COMMUNITY): Payer: Medicare Other | Attending: Oncology | Admitting: Oncology

## 2016-11-02 ENCOUNTER — Encounter (HOSPITAL_COMMUNITY): Payer: Medicare Other | Attending: Oncology

## 2016-11-02 ENCOUNTER — Encounter (HOSPITAL_COMMUNITY): Payer: Self-pay | Admitting: Oncology

## 2016-11-02 DIAGNOSIS — Z170 Estrogen receptor positive status [ER+]: Secondary | ICD-10-CM | POA: Diagnosis not present

## 2016-11-02 DIAGNOSIS — C641 Malignant neoplasm of right kidney, except renal pelvis: Secondary | ICD-10-CM

## 2016-11-02 DIAGNOSIS — Z79810 Long term (current) use of selective estrogen receptor modulators (SERMs): Secondary | ICD-10-CM

## 2016-11-02 DIAGNOSIS — C50411 Malignant neoplasm of upper-outer quadrant of right female breast: Secondary | ICD-10-CM | POA: Diagnosis not present

## 2016-11-02 LAB — COMPREHENSIVE METABOLIC PANEL
ALT: 18 U/L (ref 14–54)
AST: 28 U/L (ref 15–41)
Albumin: 3.8 g/dL (ref 3.5–5.0)
Alkaline Phosphatase: 48 U/L (ref 38–126)
Anion gap: 9 (ref 5–15)
BUN: 8 mg/dL (ref 6–20)
CO2: 23 mmol/L (ref 22–32)
Calcium: 8.5 mg/dL — ABNORMAL LOW (ref 8.9–10.3)
Chloride: 105 mmol/L (ref 101–111)
Creatinine, Ser: 0.87 mg/dL (ref 0.44–1.00)
GFR calc Af Amer: >60 mL/min (ref >60)
GFR calc non Af Amer: >60 mL/min (ref >60)
Glucose, Bld: 108 mg/dL — ABNORMAL HIGH (ref 65–99)
Potassium: 3.5 mmol/L (ref 3.5–5.1)
Sodium: 137 mmol/L (ref 135–145)
Total Bilirubin: 0.6 mg/dL (ref 0.3–1.2)
Total Protein: 7.1 g/dL (ref 6.5–8.1)

## 2016-11-02 LAB — CBC WITH DIFFERENTIAL/PLATELET
Basophils Absolute: 0 10*3/uL (ref 0.0–0.1)
Basophils Relative: 0 %
Eosinophils Absolute: 0.1 10*3/uL (ref 0.0–0.7)
Eosinophils Relative: 1 %
HCT: 40.6 % (ref 36.0–46.0)
Hemoglobin: 13.6 g/dL (ref 12.0–15.0)
Lymphocytes Relative: 27 %
Lymphs Abs: 1.9 10*3/uL (ref 0.7–4.0)
MCH: 32 pg (ref 26.0–34.0)
MCHC: 33.5 g/dL (ref 30.0–36.0)
MCV: 95.5 fL (ref 78.0–100.0)
Monocytes Absolute: 0.5 10*3/uL (ref 0.1–1.0)
Monocytes Relative: 7 %
Neutro Abs: 4.5 10*3/uL (ref 1.7–7.7)
Neutrophils Relative %: 65 %
Platelets: 213 10*3/uL (ref 150–400)
RBC: 4.25 MIL/uL (ref 3.87–5.11)
RDW: 13.6 % (ref 11.5–15.5)
WBC: 6.9 10*3/uL (ref 4.0–10.5)

## 2016-11-02 NOTE — Assessment & Plan Note (Addendum)
Extensive DCIS/LCIS of right breast, S/P simple right mastectomy and SLN which was negative for metastatic disease on 11/20/2014 by Dr. Brantley Stage.  Beginning Tamoxifen daily as chemoprevention on 01/01/2015.   Labs today: CBC diff, CMET.  I personally reviewed and went over laboratory results with the patient.  The results are noted within this dictation.   Hypocalcemia is noted.  I have recommended Ca++ 1200 mg and Vit D 1000 units daily for bone health.  I personally reviewed and went over radiographic studies with the patient.  The results are noted within this dictation.  Mammogram in Dec 2017 was BIRADS 1.  She will be due for her next mammogram in December 2018.  Since she is on Tamoxifen, she will need annual gynecologic follow-up.  Labs in 6 months: CBC diff, CMET.  Return in 6 months for follow-up.

## 2016-11-02 NOTE — Patient Instructions (Signed)
[facility] Discharge Instructions  RECOMMENDATIONS MADE BY THE CONSULTANT AND ANY TEST RESULTS WILL BE SENT TO YOUR REFERRING PHYSICIAN.  You were seen today by Kirby Crigler PA-C.  Continue taking the Tamoxifen. Start taking Calcium 1200mg  and Vit D 1000units daily . Return in 6 months for follow up and labs.   Thank you for choosing [facility] at [facility] to provide your oncology and hematology care.  To afford each patient quality time with our provider, please arrive at least 15 minutes before your scheduled appointment time.    If you have a lab appointment with the [facility] please come in thru the  Main Entrance and check in at the main information desk  You need to re-schedule your appointment should you arrive 10 or more minutes late.  We strive to give you quality time with our providers, and arriving late affects you and other patients whose appointments are after yours.  Also, if you no show three or more times for appointments you may be dismissed from the clinic at the providers discretion.     Again, thank you for choosing [facility].  Our hope is that these requests will decrease the amount of time that you wait before being seen by our physicians.       _____________________________________________________________  Should you have questions after your visit to [facility], please contact our office at [phone] between the hours of 8:30 a.m. and 4:30 p.m.  Voicemails left after 4:30 p.m. will not be returned until the following business day.  For prescription refill requests, have your pharmacy contact our office.       Resources For Cancer Patients and their Caregivers ? American Cancer Society: Can assist with transportation, wigs, general needs, runs Look Good Feel Better.        6288512582 ? Cancer Care: Provides financial assistance, online support groups,  medication/co-pay assistance.  1-800-813-HOPE 660-023-3109) ? [facility] Assists [place] cancer patients and their families through emotional , educational and financial support.  909-046-7242 ? [place] DSS Where to apply for food stamps, Medicaid and utility assistance. (604)618-4098 ? [facility]: Transportation to medical appointments. 352 453 6144 ? Social Security Administration: May apply for disability if have a Stage IV cancer. (334) 770-7831 (657)022-1910 ? [employer], Disability and Transit Services: Assists with nutrition, care and transit needs. [place] Support Programs: @10RELATIVEDAYS @ > Cancer Support Group  2nd Tuesday of the month 1pm-2pm, Journey Room  > Creative Journey  3rd Tuesday of the month 1130am-1pm, Journey Room  > Look Good Feel Better  1st Wednesday of the month 10am-12 noon, Journey Room (Call [place] to register [phone])

## 2016-11-02 NOTE — Assessment & Plan Note (Signed)
Renal Cell Carcinoma ([id number]), S/P robotic assisted laparoscopic partial nephrectomy by Dr. Tresa Moore on 01/08/2016.

## 2016-11-02 NOTE — Progress Notes (Signed)
Sara Font, MD [address] [place] [address]  Malignant neoplasm of upper-outer quadrant of right breast in female, estrogen receptor positive ([place])  Renal cell carcinoma of right kidney ([place])  CURRENT THERAPY: Tamoxifen daily beginning on 01/01/2015  INTERVAL HISTORY: Sara [name] 71 y.o. female returns for followup of extensive DCIS/LCIS of right breast, S/P simple right mastectomy and SLN which was negative for metastatic disease on 11/20/2014 by Dr. Brantley Stage.  She is S/P hysterectomy with ovarian remnants (CT imaging 11/26/2015). AND H/O right renal cell carcinoma, grade 2, S/P renal wedge resection/partial resection by Dr. Tresa Moore on 01/08/2016.  Stage: [id number].    Malignant neoplasm of upper outer quadrant of female breast ([place])   09/05/2014 Imaging    assymetry noted in R breast on screening mammogram      09/18/2014 Imaging    Irregular mass in the 12 o'clock location of the right breast. Tissue diagnosis is recommended.      09/18/2014 Imaging    IMPRESSION: Irregular mass in the 12 o'clock location of the right breast. Tissue diagnosis is recommended.      10/09/2014 Imaging    8 x 12 x 8 CM abnormal linear clumped enhancement throughout the majority of the central and upper right breast.      10/17/2014 Initial Biopsy    Breast, right, needle core biopsy, central - DUCTAL CARCINOMA IN SITU, MIXED PATTERNS (CRIBRIFORM, COMEDO AND PAPILLARY), INVOLVING MULTIPLE CORES, AT LEAST 9 MM IN MAXIMAL EXTENT IN ANY ONE CORE. - FOCAL LOBULAR NEOPLASIA (ALH/LCIS). - NO INVASIVE CAR      11/20/2014 Definitive Surgery    Breast, simple mastectomy, right - DUCTAL CARCINOMA IN SITU, SEE COMMENT. - LOBULAR CARCINOMA IN SITU (CONVENTIONAL AND PLEOMORPHIC TYPES). - PREVIOUS BIOPSY SITES (X2). - IN SITU CARCINOMA IS 3CM FROM NEAREST MARGIN (DEEP).      01/01/2015 -  Anti-estrogen oral therapy    Tamoxifen daily.      07/21/2016 Breast US    BIRADS 4      07/29/2016 Procedure    Ultrasound guided biopsy of right axillary mass/lymph node. No apparent complications.      07/30/2016 Pathology Results    Pathology of the right axillary lymph node biopsy revealed LYMPHOID TISSUE WITH DERMATOPATHIC CHANGE. NO METASTATIC CARCINOMA IDENTIFIED.        Renal cell carcinoma ([place])   01/08/2016 Procedure    Robotic-assisted laparoscopic right partial nephrectomy by Dr. Tresa Moore.      01/08/2016 Pathology Results    Kidney, wedge excision / partial resection, right renal mass RENAL CELL CARCINOMA, CLEAR CELL TYPE, WHO NUCLEAR GRADE 2 (2.4 CM) THE TUMOR IS CONFINED TO THE KIDNEY MARGINS OF RESECTION ARE NEGATIVE FOR TUMOR       She reports a left breast discomfort. She notes that it comes and goes. His been going on for a few months. Recent mammogram is negative. She denies any radiation of the pain. She denies any pattern to the discomfort. She denies any palpable abnormalities in left breast.  She denies any arthralgias, myalgias, hot flashes, and vaginal bleeding. She denies any signs or symptoms of VTE.  She reports compliance with Tamoxifen.  Review of Systems  Constitutional: Negative.  Negative for chills, fever and weight loss.  HENT: Negative.   Eyes: Negative.   Respiratory: Negative.  Negative for cough.   Cardiovascular: Negative.  Negative for chest pain.  Gastrointestinal: Negative.  Negative for abdominal pain, constipation,  diarrhea, nausea and vomiting.  Genitourinary: Negative.   Musculoskeletal: Negative.   Skin: Negative.   Neurological: Negative.  Negative for weakness.  Psychiatric/Behavioral: Negative.     Past Medical History:  Diagnosis Date  . Allergic rhinitis due to pollen   . Anxiety   . Arthritis    "knees, back, right elbow; left shoulder; right ankle" (11/20/2014)  . Breast cancer, right breast ([place])    "DCIS; zero stage" S/P mastectomy 11/20/2014  . Cervical spondylosis without myelopathy    all over  .  Cervicalgia   . Chronic lower back pain   . Constipation    takes Miralax daily  . Degeneration of cervical intervertebral disc   . Depression   . Diverticulosis of colon (without mention of hemorrhage)   . GERD (gastroesophageal reflux disease)    takes Omeprazole daily  . Graves' disease    "took a pill to correct"  . H/O hiatal hernia   . H/O urinary frequency   . Headache(784.0)    denies migraines since the 80's but has occ and takes Butalbital prn  . HTN (hypertension)    takes Metoprolol and Lisinopril daily  . Hypertension   . Insomnia    takes Ativan nigtly  . Joint pain    "all of them"  . Joint swelling    "knees, legs, ankles sometimes" (11/20/2014)  . Morbid obesity ([place])   . Numbness    LOWER LEGS  . Other postablative hypothyroidism    takes SYnthroid daily  . Palpitations   . Peptic ulcer, unspecified site, unspecified as acute or chronic, without mention of hemorrhage, perforation, or obstruction   . Primary localized osteoarthrosis, lower leg   . Pure hypercholesterolemia    takes Pravastin daily  . Recurrent UTI   . Renal cell carcinoma ([place]) 01/08/2016  . Renal mass    RIGHT  . Shortness of breath   . Sleep apnea    slight but doesn't require a CPAP (11/20/2014)  . Tendonitis of knee    bilateral    Past Surgical History:  Procedure Laterality Date  . ABDOMINAL HYSTERECTOMY    . ANTERIOR LAT LUMBAR FUSION  05/13/2012   Procedure: ANTERIOR LATERAL LUMBAR FUSION 2 LEVELS;  Surgeon: Faythe Ghee, MD;  Location: [facility];  Service: Neurosurgery;  Laterality: Left;  Left Lumbar Three-four,Lumbar four-five Extreme Lumbar Interbody Fusion with Percutaneous Pedicle Screws  . APPENDECTOMY    . BREAST BIOPSY Left ~ 2014  . BREAST BIOPSY Right 2015  . BREAST LUMPECTOMY Left 1988  . BREAST RECONSTRUCTION WITH PLACEMENT OF TISSUE EXPANDER AND FLEX HD (ACELLULAR HYDRATED DERMIS) Right 11/20/2014  . BREAST RECONSTRUCTION WITH PLACEMENT OF TISSUE EXPANDER AND  FLEX HD (ACELLULAR HYDRATED DERMIS) Right 11/20/2014   Procedure: RIGHT BREAST RECONSTRUCTION WITH PLACEMENT OF TISSUE EXPANDER AND ACELLULAR DERMA MATRIX (ACELLULAR DERMA MATRIX);  Surgeon: Crissie Reese, MD;  Location: [facility];  Service: Plastics;  Laterality: Right;  . CATARACT EXTRACTION W/ INTRAOCULAR LENS  IMPLANT, BILATERAL Bilateral   . COLONOSCOPY  Sept 2008   RMR: normal rectum, left-sided diverticula, repeat in 2018  . DIAGNOSTIC LAPAROSCOPY    . DILATION AND CURETTAGE OF UTERUS    . ESOPHAGOGASTRODUODENOSCOPY    . ESOPHAGOGASTRODUODENOSCOPY  2013   Dr. Gala Romney: Cervical esophageal web and Schatzki's ring s/p dilation, small hiatal hernia, antral and duodenal erosions likely NSAID effect, chronic duodenitis on path   . FOREIGN BODY REMOVAL Right 10/06/2013   Procedure: REMOVAL FOREIGN BODY EXTREMITY;  Surgeon:  Carole Civil, MD;  Location: [facility];  Service: Orthopedics;  Laterality: Right;  . INJECTION KNEE Left 10/06/2013   Procedure: KNEE INJECTION;  Surgeon: Carole Civil, MD;  Location: [facility];  Service: Orthopedics;  Laterality: Left;  . JOINT REPLACEMENT    . KNEE ARTHROSCOPY Right   . KNEE ARTHROSCOPY WITH LATERAL MENISECTOMY Right 10/06/2013   Procedure: KNEE ARTHROSCOPY WITH LATERAL AND MEDIAL MENISECTOMY;  Surgeon: Carole Civil, MD;  Location: [facility];  Service: Orthopedics;  Laterality: Right;  END @ 1234  . lumpectomy on pelvis     "mass of nerves"  . MASTECTOMY COMPLETE / SIMPLE W/ SENTINEL NODE BIOPSY Right 11/20/2014   axillary  . REMOVAL OF TISSUE EXPANDER AND PLACEMENT OF IMPLANT Right 12/13/2014   Procedure: REMOVAL OF RIGHT BREAST TISSUE EXPANDER;  Surgeon: Crissie Reese, MD;  Location: [facility];  Service: Plastics;  Laterality: Right;  . ROBOTIC ASSITED PARTIAL NEPHRECTOMY Right 01/08/2016   Procedure: XI ROBOTIC ASSITED PARTIAL NEPHRECTOMY;  Surgeon: Alexis Frock, MD;  Location: [facility];  Service: Urology;  Laterality: Right;  . SIMPLE MASTECTOMY WITH AXILLARY  SENTINEL NODE BIOPSY Right 11/20/2014   Procedure: SIMPLE MASTECTOMY WITH AXILLARY SENTINEL NODE BIOPSY;  Surgeon: Erroll Luna, MD;  Location: [facility];  Service: General;  Laterality: Right;  . TISSUE EXPANDER REMOVAL Right 12/13/2014   dr Harlow Mares  . TONSILLECTOMY    . TOTAL KNEE ARTHROPLASTY Right 01/24/2014   Procedure: TOTAL KNEE ARTHROPLASTY;  Surgeon: Carole Civil, MD;  Location: [facility];  Service: Orthopedics;  Laterality: Right;  . TOTAL KNEE ARTHROPLASTY Left 08/08/2014   Procedure: TOTAL KNEE ARTHROPLASTY;  Surgeon: Carole Civil, MD;  Location: [facility];  Service: Orthopedics;  Laterality: Left;  . TUBAL LIGATION    . UPPER GASTROINTESTINAL ENDOSCOPY      Family History  Problem Relation Age of Onset  . Stroke Sister   . Osteoarthritis Sister   . Osteoarthritis Brother   . Obesity Brother   . Heart attack Sister   . Heart attack Father   . Lung cancer Maternal Grandmother     non-smoker  . Heart attack      2 Brothers, 3 sisters/Father died in 48's from old age  . Colon cancer Neg Hx     Mother died at 62 from blood clot, MI    Social History   Social History  . Marital status: Married    Spouse name: Milbert Coulter  . Number of children: 4  . Years of education: N/A   Occupational History  . disability Unemployed    since 2008, knee surgery   Social History Main Topics  . Smoking status: Former Smoker    Packs/day: 1.00    Years: 27.00    Types: Cigarettes    Quit date: 12/26/1985  . Smokeless tobacco: Never Used     Comment: Quit in 1887  . Alcohol use 2.4 oz/week    4 Glasses of wine per week     Comment: occasional wine  . Drug use: No  . Sexual activity: Yes    Birth control/ protection: Surgical   Other Topics Concern  . None   Social History Narrative   Currently married for 10 years. Husband's name is Milbert Coulter. She is an excellent singer. 8 children between she and her husband. 23 grand children. 12 great grandchildren.     PHYSICAL  EXAMINATION  ECOG PERFORMANCE STATUS: 0 - Asymptomatic  Vitals:   11/02/16 1113 11/02/16 1225  BP: Marland Kitchen)  153/94 (!) 156/88  Pulse:  71  Resp:  16  Temp:  98 F (36.7 C)    GENERAL:alert, no distress, well nourished, well developed, comfortable, cooperative, obese, smiling and accompanied by her husband SKIN: skin color, texture, turgor are normal, no rashes or significant lesions HEAD: Normocephalic, No masses, lesions, tenderness or abnormalities EYES: normal, EOMI, Conjunctiva are pink and non-injected EARS: External ears normal OROPHARYNX:lips, buccal mucosa, and tongue normal and mucous membranes are moist  NECK: supple, no adenopathy, thyroid normal size, non-tender, without nodularity, trachea midline LYMPH:  no palpable lymphadenopathy, no hepatosplenomegaly BREAST:left breast normal without mass, skin or nipple changes or axillary nodes, with tenderness to palpation at the inferolateral site of surgical scar, right post-mastectomy site well healed and free of suspicious changes LUNGS: clear to auscultation and percussion HEART: regular rate & rhythm, no murmurs, no gallops, S1 normal and S2 normal ABDOMEN:abdomen soft, non-tender, obese, normal bowel sounds and no masses or organomegaly BACK: Back symmetric, no curvature. EXTREMITIES:less then 2 second capillary refill, no joint deformities, effusion, or inflammation, no edema, no skin discoloration, no clubbing, no cyanosis  NEURO: alert & oriented x 3 with fluent speech, no focal motor/sensory deficits, gait normal   LABORATORY DATA: CBC    Component Value Date/Time   WBC 6.9 11/02/2016 1013   RBC 4.25 11/02/2016 1013   HGB 13.6 11/02/2016 1013   HCT 40.6 11/02/2016 1013   PLT 213 11/02/2016 1013   MCV 95.5 11/02/2016 1013   MCH 32.0 11/02/2016 1013   MCHC 33.5 11/02/2016 1013   RDW 13.6 11/02/2016 1013   LYMPHSABS 1.9 11/02/2016 1013   MONOABS 0.5 11/02/2016 1013   EOSABS 0.1 11/02/2016 1013   BASOSABS 0.0  11/02/2016 1013      Chemistry      Component Value Date/Time   NA 137 11/02/2016 1013   NA 142 08/19/2011 0826   K 3.5 11/02/2016 1013   K 4.2 08/19/2011 0826   CL 105 11/02/2016 1013   CL 101 08/19/2011 0826   CO2 23 11/02/2016 1013   BUN 8 11/02/2016 1013   CREATININE 0.87 11/02/2016 1013      Component Value Date/Time   CALCIUM 8.5 (L) 11/02/2016 1013   CALCIUM 9.4 08/19/2011 0826   ALKPHOS 48 11/02/2016 1013   ALKPHOS 98 08/19/2011 0826   AST 28 11/02/2016 1013   AST 30 08/19/2011 0826   ALT 18 11/02/2016 1013   BILITOT 0.6 11/02/2016 1013   BILITOT 0.5 05/13/2011 0831        PENDING LABS:   RADIOGRAPHIC STUDIES:  No results found.   PATHOLOGY:    ASSESSMENT AND PLAN:  Malignant neoplasm of upper outer quadrant of female breast Extensive DCIS/LCIS of right breast, S/P simple right mastectomy and SLN which was negative for metastatic disease on 11/20/2014 by Dr. Brantley Stage.  Beginning Tamoxifen daily as chemoprevention on 01/01/2015.   Labs today: CBC diff, CMET.  I personally reviewed and went over laboratory results with the patient.  The results are noted within this dictation.   Hypocalcemia is noted.  I have recommended Ca++ 1200 mg and Vit D 1000 units daily for bone health.  I personally reviewed and went over radiographic studies with the patient.  The results are noted within this dictation.  Mammogram in Dec 2017 was BIRADS 1.  She will be due for her next mammogram in December 2018.  Since she is on Tamoxifen, she will need annual gynecologic follow-up.  Labs in 6 months: CBC  diff, CMET.  Return in 6 months for follow-up.  Renal cell carcinoma ([place]) Renal Cell Carcinoma ([id number]), S/P robotic assisted laparoscopic partial nephrectomy by Dr. Tresa Moore on 01/08/2016.   ORDERS PLACED FOR THIS ENCOUNTER: No orders of the defined types were placed in this encounter.   MEDICATIONS PRESCRIBED THIS ENCOUNTER: No orders of the defined types were placed in  this encounter.   THERAPY PLAN:  NCCN guidelines recommends the following surveillance for DCIS of breast post-operatively (2.2017):  1. Interval history and physical exam every 6-12 months for 5 years, then annually.  2. Mammogram every 12 months (and 6-12 months postradiation therapy if breast conserved Category 2B).  3. If treated with Tamoxifen, monitor per NCCN guidelines for Breast Cancer Risk Reduction.   NCCN guidelines recommends monitoring for the following for those patients on Tamoxifen/Raloxifene Therapy:  A. Asymptomatic   1. Continue risk-reduction agent   2. Continue follow-up  B. Hot-flashes or other risk-reduction, agent related symptoms   1. Symptomatic treatment.    2. If persists, re-evaluate role of risk-reduction agent   3. Continue risk-reduction agent- Continue follow-up  C. Abnormal vaginal bleeding   1. Prompt evaluation for endometrial cancer if uterus intact    A. If endometrial pathology found, re-initiation of Tamoxifen may be considered after hysterectomy if early-stage disease     1. Continue follow-up    B. If no endometrial pathology (carcinoma or hyperplasia with or without atypia) found, continue Tamoxifen and re-evaluate if symptoms persist or recur.     1. Continue follow-up  D. Anticipated elective surgery   1. Consider discontinuing Tamoxifen or Raloxifene prior to elective surgery   2. Resume Tamoxifen or Raloxifene postoperatively when ambulation is normal  E. Deep vein thrombosis, pulmonary embolism, cerebrovascular accident, or prolonged immobilization   1. Discontinue tamoxifen or raloxifene, treat underlying condition.    All questions were answered. The patient knows to call the clinic with any problems, questions or concerns. We can certainly see the patient much sooner if necessary.  Patient and plan discussed with Dr. Twana First and she is in agreement with the aforementioned.   This note is electronically signed by:  Doy Mince 11/02/2016 5:43 PM

## 2016-11-03 DIAGNOSIS — M5127 Other intervertebral disc displacement, lumbosacral region: Secondary | ICD-10-CM | POA: Diagnosis not present

## 2016-11-09 DIAGNOSIS — Z0000 Encounter for general adult medical examination without abnormal findings: Secondary | ICD-10-CM | POA: Diagnosis not present

## 2016-11-09 DIAGNOSIS — M545 Low back pain: Secondary | ICD-10-CM | POA: Diagnosis not present

## 2016-11-09 DIAGNOSIS — I10 Essential (primary) hypertension: Secondary | ICD-10-CM | POA: Diagnosis not present

## 2016-11-11 DIAGNOSIS — M5127 Other intervertebral disc displacement, lumbosacral region: Secondary | ICD-10-CM | POA: Diagnosis not present

## 2016-11-11 DIAGNOSIS — M4807 Spinal stenosis, lumbosacral region: Secondary | ICD-10-CM | POA: Diagnosis not present

## 2016-11-16 ENCOUNTER — Other Ambulatory Visit (HOSPITAL_COMMUNITY): Payer: Self-pay | Admitting: Oncology

## 2016-11-16 DIAGNOSIS — F419 Anxiety disorder, unspecified: Secondary | ICD-10-CM

## 2016-11-16 DIAGNOSIS — F329 Major depressive disorder, single episode, unspecified: Secondary | ICD-10-CM

## 2016-11-16 DIAGNOSIS — F32A Depression, unspecified: Secondary | ICD-10-CM

## 2016-11-16 MED ORDER — SERTRALINE HCL 50 MG PO TABS: 50.0000 mg | ORAL_TABLET | Freq: Every day | ORAL | 3 refills | Status: DC

## 2016-11-23 ENCOUNTER — Other Ambulatory Visit (HOSPITAL_COMMUNITY): Payer: Self-pay | Admitting: Hematology & Oncology

## 2016-12-24 ENCOUNTER — Other Ambulatory Visit: Payer: Self-pay | Admitting: Neurosurgery

## 2016-12-24 DIAGNOSIS — M5127 Other intervertebral disc displacement, lumbosacral region: Secondary | ICD-10-CM

## 2016-12-25 DIAGNOSIS — H524 Presbyopia: Secondary | ICD-10-CM | POA: Diagnosis not present

## 2016-12-25 DIAGNOSIS — Z961 Presence of intraocular lens: Secondary | ICD-10-CM | POA: Diagnosis not present

## 2016-12-25 DIAGNOSIS — H5213 Myopia, bilateral: Secondary | ICD-10-CM | POA: Diagnosis not present

## 2016-12-25 DIAGNOSIS — H52221 Regular astigmatism, right eye: Secondary | ICD-10-CM | POA: Diagnosis not present

## 2017-01-07 ENCOUNTER — Ambulatory Visit
Admission: RE | Admit: 2017-01-07 | Discharge: 2017-01-07 | Disposition: A | Discharge status: Home / Self Care | Payer: Medicare Other | Source: Ambulatory Visit | Attending: Neurosurgery | Admitting: Neurosurgery

## 2017-01-07 DIAGNOSIS — M5127 Other intervertebral disc displacement, lumbosacral region: Secondary | ICD-10-CM

## 2017-01-07 DIAGNOSIS — M48061 Spinal stenosis, lumbar region without neurogenic claudication: Secondary | ICD-10-CM | POA: Diagnosis not present

## 2017-01-07 MED ORDER — GADOBENATE DIMEGLUMINE 529 MG/ML IV SOLN
20.0000 mL | Freq: Once | INTRAVENOUS | Status: AC | PRN
  Administered 2017-01-07: 20 mL via INTRAVENOUS

## 2017-01-13 DIAGNOSIS — I10 Essential (primary) hypertension: Secondary | ICD-10-CM | POA: Diagnosis not present

## 2017-01-27 ENCOUNTER — Other Ambulatory Visit: Payer: Self-pay | Admitting: Orthopedic Surgery

## 2017-02-17 DIAGNOSIS — M5416 Radiculopathy, lumbar region: Secondary | ICD-10-CM | POA: Diagnosis not present

## 2017-03-02 ENCOUNTER — Encounter: Payer: Medicare Other | Attending: Family Medicine | Admitting: Nutrition

## 2017-03-02 VITALS — Ht 66.0 in | Wt 223.0 lb

## 2017-03-02 DIAGNOSIS — E669 Obesity, unspecified: Secondary | ICD-10-CM

## 2017-03-02 NOTE — Patient Instructions (Addendum)
Goals 1. Follow My Plate 2. Eat 2-3 carb choice per meal. 3. Eat meals on time 4. Not eat after 7 pm. No snacks between meals. 5. Walk 30 minutes 5 times per week Lose 1 lbs per week Drink 8 cups of water per day.  Cut out diet sodas and SF products

## 2017-03-02 NOTE — Progress Notes (Signed)
  Medical Nutrition Therapy:  Appt start time: 0930 end time:  1030.  Assessment:  Primary concerns today: LIves with her husband, He does the cooking. Wants to lose weight.  Desires to lose down to 175 lbs. Wt has been stable for the last few years. She lost weight through portion control and increasing exercise in the past down from 255 lbs to her current wt of 223 lbs. PMH Recently treated for right breast cancer. Treated for right kidney cancer a year ago. NO chemo or radiation for either. She is currently restricted with activity til she sees MD who did her breast surgery.    Eats 2-3 meals per day. Tends to sleep in late. Her husband does all the cooking. She admits to a higher fat, higher processed foods diet but willing to improve her eating habits. Willing to start walking.  Current diet is higher in fat, sodium and low in fresh fruits and low carb vegetables.       She is at risk for developing Type 2 DM. Had a 10 lb baby with undiagnosed gestational DM and another infant that weighted close to 9 lbs. Impaired fasting glucose levels > 110 mg/dl. No A1C.      Needs low fat high fiber plant based diet for weight loss but also to reduce reoccurrance of breast cancer.   Preferred Learning Style:   No preference indicated   Learning Readiness:   Ready  Change in progress  MEDICATIONS: See list   DIETARY INTAKE:  24-hr recall:  B ( AM):Nutrigrain bar with coffee and  Is taking Zeal 6 oz.   Snk ( AM): no  L ( PM): chicken wings-3, water Snk ( PM): occasionally has chips, or trail mix D ( PM): cabbage, greens, [place] breast, Water flavored Snk ( PM):  Cookies 3 and milk 6 oz Beverages: water  Usual physical activity: ADL  Estimated energy needs: 1200  calories 135 g carbohydrates 90 g protein 33 g fat  Progress Towards Goal(s):  In progress.   Nutritional Diagnosis:  NI-1.5 Excessive energy intake As related to Higher fat and excessive calorie diet.  As evidenced by BMI  >30.    Intervention: Nutrition  education provided on HIgh Fiber Low Fat Diet using My Plate, CHO counting, meal planning, portion sizes, timing of meals, avoiding snacks between meals  , taking medications as prescribed, benefits of exercising 30 minutes per day and prevention of diabetes. Discussed benefits of a plant based diet and weight loss post breast/kidney cancer. Stressed need for 150 minutes of physical activity a day. Goals 1. Follow My Plate 2. Eat 2-3 carb choice per meal. 3. Eat meals on time 4. Not eat after 7 pm. No snacks between meals. 5. Walk 30 minutes 5 times per week Lose 1 lbs per week Drink 8 cups of water per day.  Cut out diet sodas and SF products  Teaching Method Utilized:  Visual Auditory Hands on  Handouts given during visit include:  The Plate Method   Meal Plan Card  Barriers to learning/adherence to lifestyle change:  none  Demonstrated degree of understanding via:  Teach Back   Monitoring/Evaluation:  Dietary intake, exercise, meal planning, and body weight in 1 month(s). Would recommend to check an A1C due to history of larger birth babies and impaired fasting glucose levels on labs with obesity.

## 2017-03-11 DIAGNOSIS — M5416 Radiculopathy, lumbar region: Secondary | ICD-10-CM | POA: Diagnosis not present

## 2017-03-15 DIAGNOSIS — E039 Hypothyroidism, unspecified: Secondary | ICD-10-CM | POA: Diagnosis not present

## 2017-03-15 DIAGNOSIS — I10 Essential (primary) hypertension: Secondary | ICD-10-CM | POA: Diagnosis not present

## 2017-04-05 ENCOUNTER — Encounter: Payer: Medicare Other | Attending: Family Medicine | Admitting: Nutrition

## 2017-04-05 VITALS — Ht 66.0 in | Wt 219.0 lb

## 2017-04-05 DIAGNOSIS — R739 Hyperglycemia, unspecified: Secondary | ICD-10-CM

## 2017-04-05 DIAGNOSIS — E669 Obesity, unspecified: Secondary | ICD-10-CM

## 2017-04-05 NOTE — Progress Notes (Signed)
  Medical Nutrition Therapy:  Appt start time: 1043 end time:  1100  Assessment:  Primary concerns today: Here with her husband. Lost 4 lbs. Has been drinking something called Zeal, which has liquids vitamins in it.. Cut out a lot of junk foods. Cut out chocolate and cookies.  Has been eating a lot more fresh fruits and vegetables. Complains of foods costing more.  Has been working out on a glider with 5 minutes daily. Willing to start walking when the weather is cooler.   TO see PCP in August and may get A1C done then. No other recent labs available. CMP Latest Ref Rng & Units 11/02/2016 06/02/2016 04/14/2016  Glucose 65 - 99 mg/dL 108(H) 111(H) -  BUN 6 - 20 mg/dL 8 5(L) -  Creatinine 0.44 - 1.00 mg/dL 0.87 0.75 0.80  Sodium 135 - 145 mmol/L 137 136 -  Potassium 3.5 - 5.1 mmol/L 3.5 3.4(L) -  Chloride 101 - 111 mmol/L 105 101 -  CO2 22 - 32 mmol/L 23 26 -  Calcium 8.9 - 10.3 mg/dL 8.5(L) 8.8(L) -  Total Protein 6.5 - 8.1 g/dL 7.1 7.1 -  Total Bilirubin 0.3 - 1.2 mg/dL 0.6 0.2(L) -  Alkaline Phos 38 - 126 U/L 48 50 -  AST 15 - 41 U/L 28 37 -  ALT 14 - 54 U/L 18 30 -   Preferred Learning Style:   No preference indicated   Learning Readiness:   Ready  Change in progress  MEDICATIONS: See list   DIETARY INTAKE:  24-hr recall:  B ( AM 1 slice of ww toast with honey, orange juice and coffee with creamer, Snk ( AM): no  L ( PM): [place] sandwich, spinach, onions, green peppers and ww bread, water Snk ( PM): D ( PM): Beans, hamburger meat, 1/2 potato salad,  Water,  Snk ( PM):   Beverages: water  Usual physical activity: ADL  Estimated energy needs: 1200  calories 135 g carbohydrates 90 g protein 33 g fat  Progress Towards Goal(s):  In progress.   Nutritional Diagnosis:  NI-1.5 Excessive energy intake As related to Higher fat and excessive calorie diet.  As evidenced by BMI >30.    Intervention: Nutrition  education provided on HIgh Fiber Low Fat Diet using My Plate,  CHO counting, meal planning, portion sizes, timing of meals, avoiding snacks between meals  , taking medications as prescribed, benefits of exercising 30 minutes per day and prevention of diabetes. Discussed benefits of a plant based diet and weight loss post breast/kidney cancer. Stressed need for 150 minutes of physical activity a day.  Goals 1. Continue to follow the plate method 2. Avoid snacks between meals unless protein or vegetables 3. Exercise 15 minutes of walking and 5 minutes on  Health rider daily. 4. Lose 3-4 lbs per month Keep drinking water Increase fresh fruits and vegetables daily Cut out juice and eat fruit instead.  Teaching Method Utilized:  Visual Auditory Hands on  Handouts given during visit include:  The Plate Method   Meal Plan Card  Barriers to learning/adherence to lifestyle change:  none  Demonstrated degree of understanding via:  Teach Back   Monitoring/Evaluation:  Dietary intake, exercise, meal planning, and body weight in 3 month(s).

## 2017-04-05 NOTE — Patient Instructions (Addendum)
Goals 1. Continue to follow the plate method 2. Avoid snacks between meals unless protein or vegetables 3. Exercise 15 minutes of walking and 5 minutes on  Health rider daily. 4. Lose 3-4 lbs per month Keep drinking water Increase fresh fruits and vegetables daily Cut out juice and eat fruit instead.

## 2017-04-16 ENCOUNTER — Other Ambulatory Visit (HOSPITAL_COMMUNITY): Payer: Self-pay | Admitting: Adult Health

## 2017-04-16 DIAGNOSIS — F32A Depression, unspecified: Secondary | ICD-10-CM

## 2017-04-16 DIAGNOSIS — F419 Anxiety disorder, unspecified: Principal | ICD-10-CM

## 2017-04-16 DIAGNOSIS — F329 Major depressive disorder, single episode, unspecified: Secondary | ICD-10-CM

## 2017-04-28 DIAGNOSIS — Z853 Personal history of malignant neoplasm of breast: Secondary | ICD-10-CM | POA: Diagnosis not present

## 2017-04-29 DIAGNOSIS — R310 Gross hematuria: Secondary | ICD-10-CM | POA: Diagnosis not present

## 2017-05-03 ENCOUNTER — Other Ambulatory Visit (HOSPITAL_COMMUNITY): Payer: Medicare Other

## 2017-05-03 ENCOUNTER — Ambulatory Visit (HOSPITAL_COMMUNITY): Payer: Medicare Other | Admitting: Oncology

## 2017-05-03 DIAGNOSIS — I10 Essential (primary) hypertension: Secondary | ICD-10-CM | POA: Diagnosis not present

## 2017-05-03 DIAGNOSIS — E038 Other specified hypothyroidism: Secondary | ICD-10-CM | POA: Diagnosis not present

## 2017-05-04 ENCOUNTER — Ambulatory Visit (HOSPITAL_COMMUNITY): Payer: Medicare Other

## 2017-05-04 ENCOUNTER — Other Ambulatory Visit (HOSPITAL_COMMUNITY): Payer: Medicare Other

## 2017-05-05 ENCOUNTER — Telehealth: Payer: Self-pay

## 2017-05-05 ENCOUNTER — Telehealth: Payer: Self-pay | Admitting: Internal Medicine

## 2017-05-05 NOTE — Telephone Encounter (Signed)
Pt received a triage letter from [facility]. Please call her back [phone] No GI issues, no heart attacks or blood thinners

## 2017-05-05 NOTE — Telephone Encounter (Signed)
Letter mailed to pt.  

## 2017-05-05 NOTE — Telephone Encounter (Signed)
Recall for tcs °

## 2017-05-10 ENCOUNTER — Telehealth: Payer: Self-pay

## 2017-05-10 NOTE — Telephone Encounter (Signed)
Pt needs OV. See previous note.

## 2017-05-10 NOTE — Telephone Encounter (Signed)
PT is scheduled an OV with Neil Crouch, PA on 07/05/2017 at 10:30 AM due to meds.

## 2017-05-26 ENCOUNTER — Encounter (HOSPITAL_COMMUNITY): Payer: Medicare Other | Attending: Oncology

## 2017-05-26 ENCOUNTER — Encounter (HOSPITAL_COMMUNITY): Payer: Self-pay | Admitting: Oncology

## 2017-05-26 ENCOUNTER — Encounter (HOSPITAL_BASED_OUTPATIENT_CLINIC_OR_DEPARTMENT_OTHER): Payer: Medicare Other | Admitting: Oncology

## 2017-05-26 VITALS — BP 143/79 | HR 67 | Temp 98.1°F | Resp 20 | Ht 66.0 in | Wt 216.4 lb

## 2017-05-26 DIAGNOSIS — Z79810 Long term (current) use of selective estrogen receptor modulators (SERMs): Secondary | ICD-10-CM | POA: Diagnosis not present

## 2017-05-26 DIAGNOSIS — Z87891 Personal history of nicotine dependence: Secondary | ICD-10-CM | POA: Insufficient documentation

## 2017-05-26 DIAGNOSIS — D0511 Intraductal carcinoma in situ of right breast: Secondary | ICD-10-CM

## 2017-05-26 DIAGNOSIS — C50412 Malignant neoplasm of upper-outer quadrant of left female breast: Secondary | ICD-10-CM | POA: Insufficient documentation

## 2017-05-26 DIAGNOSIS — F329 Major depressive disorder, single episode, unspecified: Secondary | ICD-10-CM | POA: Insufficient documentation

## 2017-05-26 DIAGNOSIS — C50411 Malignant neoplasm of upper-outer quadrant of right female breast: Secondary | ICD-10-CM

## 2017-05-26 DIAGNOSIS — F419 Anxiety disorder, unspecified: Secondary | ICD-10-CM | POA: Diagnosis not present

## 2017-05-26 DIAGNOSIS — E7800 Pure hypercholesterolemia, unspecified: Secondary | ICD-10-CM | POA: Diagnosis not present

## 2017-05-26 DIAGNOSIS — K219 Gastro-esophageal reflux disease without esophagitis: Secondary | ICD-10-CM | POA: Diagnosis not present

## 2017-05-26 DIAGNOSIS — Z9011 Acquired absence of right breast and nipple: Secondary | ICD-10-CM | POA: Diagnosis not present

## 2017-05-26 DIAGNOSIS — M47812 Spondylosis without myelopathy or radiculopathy, cervical region: Secondary | ICD-10-CM | POA: Insufficient documentation

## 2017-05-26 DIAGNOSIS — C641 Malignant neoplasm of right kidney, except renal pelvis: Secondary | ICD-10-CM | POA: Diagnosis not present

## 2017-05-26 DIAGNOSIS — Z90710 Acquired absence of both cervix and uterus: Secondary | ICD-10-CM | POA: Diagnosis not present

## 2017-05-26 DIAGNOSIS — I10 Essential (primary) hypertension: Secondary | ICD-10-CM | POA: Insufficient documentation

## 2017-05-26 DIAGNOSIS — C649 Malignant neoplasm of unspecified kidney, except renal pelvis: Secondary | ICD-10-CM

## 2017-05-26 DIAGNOSIS — D0501 Lobular carcinoma in situ of right breast: Secondary | ICD-10-CM

## 2017-05-26 DIAGNOSIS — G4730 Sleep apnea, unspecified: Secondary | ICD-10-CM | POA: Insufficient documentation

## 2017-05-26 DIAGNOSIS — D641 Secondary sideroblastic anemia due to disease: Secondary | ICD-10-CM

## 2017-05-26 LAB — COMPREHENSIVE METABOLIC PANEL
ALT: 30 U/L (ref 14–54)
AST: 41 U/L (ref 15–41)
Albumin: 3.9 g/dL (ref 3.5–5.0)
Alkaline Phosphatase: 46 U/L (ref 38–126)
Anion gap: 12 (ref 5–15)
BUN: 5 mg/dL — ABNORMAL LOW (ref 6–20)
CO2: 24 mmol/L (ref 22–32)
Calcium: 9 mg/dL (ref 8.9–10.3)
Chloride: 96 mmol/L — ABNORMAL LOW (ref 101–111)
Creatinine, Ser: 0.74 mg/dL (ref 0.44–1.00)
GFR calc Af Amer: >60 mL/min (ref >60)
GFR calc non Af Amer: >60 mL/min (ref >60)
Glucose, Bld: 110 mg/dL — ABNORMAL HIGH (ref 65–99)
Potassium: 3.8 mmol/L (ref 3.5–5.1)
Sodium: 132 mmol/L — ABNORMAL LOW (ref 135–145)
Total Bilirubin: 0.4 mg/dL (ref 0.3–1.2)
Total Protein: 7.3 g/dL (ref 6.5–8.1)

## 2017-05-26 LAB — CBC WITH DIFFERENTIAL/PLATELET
Basophils Absolute: 0 10*3/uL (ref 0.0–0.1)
Basophils Relative: 0 %
Eosinophils Absolute: 0.1 10*3/uL (ref 0.0–0.7)
Eosinophils Relative: 2 %
HCT: 39.7 % (ref 36.0–46.0)
Hemoglobin: 13.4 g/dL (ref 12.0–15.0)
Lymphocytes Relative: 32 %
Lymphs Abs: 2.4 10*3/uL (ref 0.7–4.0)
MCH: 31.7 pg (ref 26.0–34.0)
MCHC: 33.8 g/dL (ref 30.0–36.0)
MCV: 93.9 fL (ref 78.0–100.0)
Monocytes Absolute: 0.7 10*3/uL (ref 0.1–1.0)
Monocytes Relative: 9 %
Neutro Abs: 4.3 10*3/uL (ref 1.7–7.7)
Neutrophils Relative %: 57 %
Platelets: 234 10*3/uL (ref 150–400)
RBC: 4.23 MIL/uL (ref 3.87–5.11)
RDW: 13.5 % (ref 11.5–15.5)
WBC: 7.5 10*3/uL (ref 4.0–10.5)

## 2017-05-26 NOTE — Progress Notes (Signed)
Sara [name], [address] [place] [address]  Bilateral malignant neoplasm of upper outer quadrant of breast in female, unspecified estrogen receptor status ([place]) - Plan: CBC with Differential, Comprehensive metabolic panel  Renal cell carcinoma, unspecified laterality ([place]) - Plan: CBC with Differential, Comprehensive metabolic panel  CURRENT THERAPY: Tamoxifen daily beginning on 01/01/2015  INTERVAL HISTORY: Poetry B Terra 71 y.o. female returns for followup of extensive DCIS/LCIS of right breast, S/P simple right mastectomy and SLN which was negative for metastatic disease on 11/20/2014 by Dr. Brantley Stage.  She is S/P hysterectomy with ovarian remnants (CT imaging 11/26/2015). AND H/O right renal cell carcinoma, grade 2, S/P renal wedge resection/partial resection by Dr. Tresa Moore on 01/08/2016.  Stage: pT1ApNX.    Malignant neoplasm of upper outer quadrant of female breast ([place])   09/05/2014 Imaging    assymetry noted in R breast on screening mammogram      09/18/2014 Imaging    Irregular mass in the 12 o'clock location of the right breast. Tissue diagnosis is recommended.      09/18/2014 Imaging    IMPRESSION: Irregular mass in the 12 o'clock location of the right breast. Tissue diagnosis is recommended.      10/09/2014 Imaging    8 x 12 x 8 CM abnormal linear clumped enhancement throughout the majority of the central and upper right breast.      10/17/2014 Initial Biopsy    Breast, right, needle core biopsy, central - DUCTAL CARCINOMA IN SITU, MIXED PATTERNS (CRIBRIFORM, COMEDO AND PAPILLARY), INVOLVING MULTIPLE CORES, AT LEAST 9 MM IN MAXIMAL EXTENT IN ANY ONE CORE. - FOCAL LOBULAR NEOPLASIA (ALH/LCIS). - NO INVASIVE CAR      11/20/2014 Definitive Surgery    Breast, simple mastectomy, right - DUCTAL CARCINOMA IN SITU, SEE COMMENT. - LOBULAR CARCINOMA IN SITU (CONVENTIONAL AND PLEOMORPHIC TYPES). - PREVIOUS BIOPSY SITES (X2). - IN SITU CARCINOMA IS 3CM FROM  NEAREST MARGIN (DEEP).      01/01/2015 -  Anti-estrogen oral therapy    Tamoxifen daily.      07/21/2016 Breast US    BIRADS 4      07/29/2016 Procedure    Ultrasound guided biopsy of right axillary mass/lymph node. No apparent complications.      07/30/2016 Pathology Results    Pathology of the right axillary lymph node biopsy revealed LYMPHOID TISSUE WITH DERMATOPATHIC CHANGE. NO METASTATIC CARCINOMA IDENTIFIED.        Renal cell carcinoma ([place])   01/08/2016 Procedure    Robotic-assisted laparoscopic right partial nephrectomy by Dr. Tresa Moore.      01/08/2016 Pathology Results    Kidney, wedge excision / partial resection, right renal mass RENAL CELL CARCINOMA, CLEAR CELL TYPE, WHO NUCLEAR GRADE 2 (2.4 CM) THE TUMOR IS CONFINED TO THE KIDNEY MARGINS OF RESECTION ARE NEGATIVE FOR TUMOR       Patient presents with her husband for continued follow up. Since her last visit she has undergone back surgery for a ruptured disk in February 2018. Unfortunately she states that her back pain has returned despite the surgery and she has sciatica symptoms down her legs. She has not palpated any new breast masses. She takes her tamoxifen daily without side effects.  Review of Systems  Constitutional: Negative.  Negative for chills, fever and weight loss.  HENT: Negative.   Eyes: Negative.   Respiratory: Negative.  Negative for cough.   Cardiovascular: Negative.  Negative for chest pain.  Gastrointestinal: Negative.  Negative for abdominal pain, constipation, diarrhea, nausea and vomiting.  Genitourinary: Negative.   Musculoskeletal: Positive for back pain.  Skin: Negative.   Neurological: Negative.  Negative for weakness.  Psychiatric/Behavioral: Negative.     Past Medical History:  Diagnosis Date  . Allergic rhinitis due to pollen   . Anxiety   . Arthritis    "knees, back, right elbow; left shoulder; right ankle" (11/20/2014)  . Breast cancer, right breast ([place])    "DCIS; zero  stage" S/P mastectomy 11/20/2014  . Cervical spondylosis without myelopathy    all over  . Cervicalgia   . Chronic lower back pain   . Constipation    takes Miralax daily  . Degeneration of cervical intervertebral disc   . Depression   . Diverticulosis of colon (without mention of hemorrhage)   . GERD (gastroesophageal reflux disease)    takes Omeprazole daily  . Graves' disease    "took a pill to correct"  . H/O hiatal hernia   . H/O urinary frequency   . Headache(784.0)    denies migraines since the 80's but has occ and takes Butalbital prn  . HTN (hypertension)    takes Metoprolol and Lisinopril daily  . Hypertension   . Insomnia    takes Ativan nigtly  . Joint pain    "all of them"  . Joint swelling    "knees, legs, ankles sometimes" (11/20/2014)  . Morbid obesity ([place])   . Numbness    LOWER LEGS  . Other postablative hypothyroidism    takes SYnthroid daily  . Palpitations   . Peptic ulcer, unspecified site, unspecified as acute or chronic, without mention of hemorrhage, perforation, or obstruction   . Primary localized osteoarthrosis, lower leg   . Pure hypercholesterolemia    takes Pravastin daily  . Recurrent UTI   . Renal cell carcinoma ([place]) 01/08/2016  . Renal mass    RIGHT  . Shortness of breath   . Sleep apnea    slight but doesn't require a CPAP (11/20/2014)  . Tendonitis of knee    bilateral    Past Surgical History:  Procedure Laterality Date  . ABDOMINAL HYSTERECTOMY    . ANTERIOR LAT LUMBAR FUSION  05/13/2012   Procedure: ANTERIOR LATERAL LUMBAR FUSION 2 LEVELS;  Surgeon: Faythe Ghee, MD;  Location: [facility];  Service: Neurosurgery;  Laterality: Left;  Left Lumbar Three-four,Lumbar four-five Extreme Lumbar Interbody Fusion with Percutaneous Pedicle Screws  . APPENDECTOMY    . BREAST BIOPSY Left ~ 2014  . BREAST BIOPSY Right 2015  . BREAST LUMPECTOMY Left 1988  . BREAST RECONSTRUCTION WITH PLACEMENT OF TISSUE EXPANDER AND FLEX HD (ACELLULAR  HYDRATED DERMIS) Right 11/20/2014  . BREAST RECONSTRUCTION WITH PLACEMENT OF TISSUE EXPANDER AND FLEX HD (ACELLULAR HYDRATED DERMIS) Right 11/20/2014   Procedure: RIGHT BREAST RECONSTRUCTION WITH PLACEMENT OF TISSUE EXPANDER AND ACELLULAR DERMA MATRIX (ACELLULAR DERMA MATRIX);  Surgeon: Crissie Reese, MD;  Location: [facility];  Service: Plastics;  Laterality: Right;  . CATARACT EXTRACTION W/ INTRAOCULAR LENS  IMPLANT, BILATERAL Bilateral   . COLONOSCOPY  Sept 2008   RMR: normal rectum, left-sided diverticula, repeat in 2018  . DIAGNOSTIC LAPAROSCOPY    . DILATION AND CURETTAGE OF UTERUS    . ESOPHAGOGASTRODUODENOSCOPY    . ESOPHAGOGASTRODUODENOSCOPY  2013   Dr. Gala Romney: Cervical esophageal web and Schatzki's ring s/p dilation, small hiatal hernia, antral and duodenal erosions likely NSAID effect, chronic duodenitis on path   . FOREIGN BODY REMOVAL Right 10/06/2013  Procedure: REMOVAL FOREIGN BODY EXTREMITY;  Surgeon: Carole Civil, MD;  Location: [facility];  Service: Orthopedics;  Laterality: Right;  . INJECTION KNEE Left 10/06/2013   Procedure: KNEE INJECTION;  Surgeon: Carole Civil, MD;  Location: [facility];  Service: Orthopedics;  Laterality: Left;  . JOINT REPLACEMENT    . KNEE ARTHROSCOPY Right   . KNEE ARTHROSCOPY WITH LATERAL MENISECTOMY Right 10/06/2013   Procedure: KNEE ARTHROSCOPY WITH LATERAL AND MEDIAL MENISECTOMY;  Surgeon: Carole Civil, MD;  Location: [facility];  Service: Orthopedics;  Laterality: Right;  END @ 1234  . lumpectomy on pelvis     "mass of nerves"  . MASTECTOMY COMPLETE / SIMPLE W/ SENTINEL NODE BIOPSY Right 11/20/2014   axillary  . REMOVAL OF TISSUE EXPANDER AND PLACEMENT OF IMPLANT Right 12/13/2014   Procedure: REMOVAL OF RIGHT BREAST TISSUE EXPANDER;  Surgeon: Crissie Reese, MD;  Location: [facility];  Service: Plastics;  Laterality: Right;  . ROBOTIC ASSITED PARTIAL NEPHRECTOMY Right 01/08/2016   Procedure: XI ROBOTIC ASSITED PARTIAL NEPHRECTOMY;  Surgeon: Alexis Frock,  MD;  Location: [facility];  Service: Urology;  Laterality: Right;  . SIMPLE MASTECTOMY WITH AXILLARY SENTINEL NODE BIOPSY Right 11/20/2014   Procedure: SIMPLE MASTECTOMY WITH AXILLARY SENTINEL NODE BIOPSY;  Surgeon: Erroll Luna, MD;  Location: [facility];  Service: General;  Laterality: Right;  . TISSUE EXPANDER REMOVAL Right 12/13/2014   dr Harlow Mares  . TONSILLECTOMY    . TOTAL KNEE ARTHROPLASTY Right 01/24/2014   Procedure: TOTAL KNEE ARTHROPLASTY;  Surgeon: Carole Civil, MD;  Location: [facility];  Service: Orthopedics;  Laterality: Right;  . TOTAL KNEE ARTHROPLASTY Left 08/08/2014   Procedure: TOTAL KNEE ARTHROPLASTY;  Surgeon: Carole Civil, MD;  Location: [facility];  Service: Orthopedics;  Laterality: Left;  . TUBAL LIGATION    . UPPER GASTROINTESTINAL ENDOSCOPY      Family History  Problem Relation Age of Onset  . Stroke Sister   . Osteoarthritis Sister   . Osteoarthritis Brother   . Obesity Brother   . Heart attack Sister   . Heart attack Father   . Lung cancer Maternal Grandmother        non-smoker  . Heart attack Unknown        2 Brothers, 3 sisters/Father died in 69's from old age  . Colon cancer Neg Hx        Mother died at 33 from blood clot, MI    Social History   Social History  . Marital status: Married    Spouse name: Milbert Coulter  . Number of children: 4  . Years of education: N/A   Occupational History  . disability Unemployed    since 2008, knee surgery   Social History Main Topics  . Smoking status: Former Smoker    Packs/day: 1.00    Years: 27.00    Types: Cigarettes    Quit date: 12/26/1985  . Smokeless tobacco: Never Used     Comment: Quit in 1887  . Alcohol use 2.4 oz/week    4 Glasses of wine per week     Comment: occasional wine  . Drug use: No  . Sexual activity: Yes    Birth control/ protection: Surgical   Other Topics Concern  . None   Social History Narrative   Currently married for 10 years. Husband's name is Milbert Coulter. She is an excellent singer. 8  children between she and her husband. 23 grand children. 12 great grandchildren.     PHYSICAL EXAMINATION  ECOG PERFORMANCE STATUS: 0 - Asymptomatic  Vitals:   05/26/17 1147  BP: (!) 143/79  Pulse: 67  Resp: 20  Temp: 98.1 F (36.7 C)  SpO2: 98%    GENERAL:alert, no distress, well nourished, well developed, comfortable, cooperative, obese, smiling and accompanied by her husband SKIN: skin color, texture, turgor are normal, no rashes or significant lesions HEAD: Normocephalic, No masses, lesions, tenderness or abnormalities EYES: normal, EOMI, Conjunctiva are pink and non-injected EARS: External ears normal OROPHARYNX:lips, buccal mucosa, and tongue normal and mucous membranes are moist  NECK: supple, no adenopathy, thyroid normal size, non-tender, without nodularity, trachea midline LYMPH:  no palpable lymphadenopathy, no hepatosplenomegaly BREAST:left breast normal without mass, skin or nipple changes or axillary lymphadenopathy, surgical scar well healed, right post-mastectomy site well healed and without any subcutaneous nodules LUNGS: clear to auscultation and percussion HEART: regular rate & rhythm, no murmurs, no gallops, S1 normal and S2 normal ABDOMEN:abdomen soft, non-tender, obese, normal bowel sounds and no masses or organomegaly BACK: Back symmetric, no curvature. EXTREMITIES:less then 2 second capillary refill, no joint deformities, effusion, or inflammation, no edema, no skin discoloration, no clubbing, no cyanosis  NEURO: alert & oriented x 3 with fluent speech, no focal motor/sensory deficits, gait normal   LABORATORY DATA: CBC    Component Value Date/Time   WBC 7.5 05/26/2017 1112   RBC 4.23 05/26/2017 1112   HGB 13.4 05/26/2017 1112   HCT 39.7 05/26/2017 1112   PLT 234 05/26/2017 1112   MCV 93.9 05/26/2017 1112   MCH 31.7 05/26/2017 1112   MCHC 33.8 05/26/2017 1112   RDW 13.5 05/26/2017 1112   LYMPHSABS 2.4 05/26/2017 1112   MONOABS 0.7 05/26/2017  1112   EOSABS 0.1 05/26/2017 1112   BASOSABS 0.0 05/26/2017 1112      Chemistry      Component Value Date/Time   NA 137 11/02/2016 1013   NA 142 08/19/2011 0826   K 3.5 11/02/2016 1013   K 4.2 08/19/2011 0826   CL 105 11/02/2016 1013   CL 101 08/19/2011 0826   CO2 23 11/02/2016 1013   BUN 8 11/02/2016 1013   CREATININE 0.87 11/02/2016 1013      Component Value Date/Time   CALCIUM 8.5 (L) 11/02/2016 1013   CALCIUM 9.4 08/19/2011 0826   ALKPHOS 48 11/02/2016 1013   ALKPHOS 98 08/19/2011 0826   AST 28 11/02/2016 1013   AST 30 08/19/2011 0826   ALT 18 11/02/2016 1013   BILITOT 0.6 11/02/2016 1013   BILITOT 0.5 05/13/2011 0831        PENDING LABS:   RADIOGRAPHIC STUDIES:  No results found.   PATHOLOGY:    ASSESSMENT AND PLAN:  1. Extensive DCIS/LCIS of right breast, S/P simple right mastectomy and SLN which was negative for metastatic disease on 11/20/2014 by Dr. Brantley Stage.  She is S/P hysterectomy with ovarian remnants (CT imaging 11/26/2015). - NED on exam today. - Continue tamoxifen for 5 years. Tolerating tamoxifen well without side effects. - No need for annual pelvic exams since patient had hysterectomy in 1975.  - Left breast annual mammogram due in December 2018. - Reviewed her labs from today in detail with the patient. - RTC in 6 months for follow up.   ORDERS PLACED FOR THIS ENCOUNTER: Orders Placed This Encounter  Procedures  . CBC with Differential  . Comprehensive metabolic panel    THERAPY PLAN:  NCCN guidelines recommends the following surveillance for DCIS of breast post-operatively (2.2017):  1. Interval history and  physical exam every 6-12 months for 5 years, then annually.  2. Mammogram every 12 months (and 6-12 months postradiation therapy if breast conserved Category 2B).  3. If treated with Tamoxifen, monitor per NCCN guidelines for Breast Cancer Risk Reduction.   NCCN guidelines recommends monitoring for the following for those patients  on Tamoxifen/Raloxifene Therapy:  A. Asymptomatic   1. Continue risk-reduction agent   2. Continue follow-up  B. Hot-flashes or other risk-reduction, agent related symptoms   1. Symptomatic treatment.    2. If persists, re-evaluate role of risk-reduction agent   3. Continue risk-reduction agent- Continue follow-up  C. Abnormal vaginal bleeding   1. Prompt evaluation for endometrial cancer if uterus intact    A. If endometrial pathology found, re-initiation of Tamoxifen may be considered after hysterectomy if early-stage disease     1. Continue follow-up    B. If no endometrial pathology (carcinoma or hyperplasia with or without atypia) found, continue Tamoxifen and re-evaluate if symptoms persist or recur.     1. Continue follow-up  D. Anticipated elective surgery   1. Consider discontinuing Tamoxifen or Raloxifene prior to elective surgery   2. Resume Tamoxifen or Raloxifene postoperatively when ambulation is normal  E. Deep vein thrombosis, pulmonary embolism, cerebrovascular accident, or prolonged immobilization   1. Discontinue tamoxifen or raloxifene, treat underlying condition.    All questions were answered. The patient knows to call the clinic with any problems, questions or concerns. We can certainly see the patient much sooner if necessary.    This note is electronically signed by: Twana First, MD 05/26/2017 11:54 AM

## 2017-06-02 DIAGNOSIS — M5416 Radiculopathy, lumbar region: Secondary | ICD-10-CM | POA: Diagnosis not present

## 2017-06-04 ENCOUNTER — Other Ambulatory Visit: Payer: Self-pay | Admitting: Neurosurgery

## 2017-06-04 DIAGNOSIS — M5416 Radiculopathy, lumbar region: Secondary | ICD-10-CM

## 2017-06-13 ENCOUNTER — Other Ambulatory Visit (HOSPITAL_COMMUNITY): Payer: Self-pay | Admitting: Adult Health

## 2017-06-16 ENCOUNTER — Ambulatory Visit
Admission: RE | Admit: 2017-06-16 | Discharge: 2017-06-16 | Disposition: A | Discharge status: Home / Self Care | Payer: Medicare Other | Source: Ambulatory Visit | Attending: Neurosurgery | Admitting: Neurosurgery

## 2017-06-16 DIAGNOSIS — M5416 Radiculopathy, lumbar region: Secondary | ICD-10-CM

## 2017-06-16 DIAGNOSIS — M48061 Spinal stenosis, lumbar region without neurogenic claudication: Secondary | ICD-10-CM | POA: Diagnosis not present

## 2017-06-16 MED ORDER — GADOBENATE DIMEGLUMINE 529 MG/ML IV SOLN
20.0000 mL | Freq: Once | INTRAVENOUS | Status: AC | PRN
  Administered 2017-06-16: 20 mL via INTRAVENOUS

## 2017-06-29 DIAGNOSIS — I10 Essential (primary) hypertension: Secondary | ICD-10-CM | POA: Diagnosis not present

## 2017-06-30 ENCOUNTER — Encounter: Payer: Medicare Other | Attending: Family Medicine | Admitting: Nutrition

## 2017-06-30 VITALS — Wt 213.0 lb

## 2017-06-30 DIAGNOSIS — M5416 Radiculopathy, lumbar region: Secondary | ICD-10-CM | POA: Diagnosis not present

## 2017-06-30 DIAGNOSIS — I10 Essential (primary) hypertension: Secondary | ICD-10-CM | POA: Diagnosis not present

## 2017-06-30 DIAGNOSIS — E669 Obesity, unspecified: Secondary | ICD-10-CM

## 2017-06-30 NOTE — Patient Instructions (Addendum)
Goals 1. Continue to increase fresh fruits and vegetables. 2. Increase water to 5 bottles a day 3. Lose 2-3 lbs per month 4. Add protein-eggs with oatmeal at breakfast. Only  Need 1 packet of oatmeal at breakfast.

## 2017-06-30 NOTE — Progress Notes (Signed)
  Medical Nutrition Therapy:  Appt start time: 1000 end time:  1030  Assessment:  Primary concerns today: Here with her husband. Lost 6 lbs.   Has an injured back and limited her exercise. She is eating a lot more vegetables, less desserts. No A1C done   Has impaired fasting blood sugars that may indicate prediabetes. Would benefit from an A1C level to further evaluate for diabetes type 2. . CMP Latest Ref Rng & Units 05/26/2017 11/02/2016 06/02/2016  Glucose 65 - 99 mg/dL 110(H) 108(H) 111(H)  BUN 6 - 20 mg/dL 5(L) 8 5(L)  Creatinine 0.44 - 1.00 mg/dL 0.74 0.87 0.75  Sodium 135 - 145 mmol/L 132(L) 137 136  Potassium 3.5 - 5.1 mmol/L 3.8 3.5 3.4(L)  Chloride 101 - 111 mmol/L 96(L) 105 101  CO2 22 - 32 mmol/L 24 23 26   Calcium 8.9 - 10.3 mg/dL 9.0 8.5(L) 8.8(L)  Total Protein 6.5 - 8.1 g/dL 7.3 7.1 7.1  Total Bilirubin 0.3 - 1.2 mg/dL 0.4 0.6 0.2(L)  Alkaline Phos 38 - 126 U/L 46 48 50  AST 15 - 41 U/L 41 28 37  ALT 14 - 54 U/L 30 18 30    Preferred Learning Style:   No preference indicated   Learning Readiness:   Ready  Change in progress  MEDICATIONS: See list   DIETARY INTAKE:  24-hr recall:  B ( AM Oatmeal, coffee, juice Zeal Snk ( AM): no  L ( PM): [place] sandwich with cucukes, spinach, tomatoes, onions, green peppers, on ww bread, banana, propel Snk ( PM): D ( PM): air fried chicken leg, pinto beans and coleslaw, cranberry flavored water Snk ( PM):   Beverages: water  Usual physical activity: ADL  Estimated energy needs: 1200  calories 135 g carbohydrates 90 g protein 33 g fat  Progress Towards Goal(s):  In progress.   Nutritional Diagnosis:  NI-1.5 Excessive energy intake As related to Higher fat and excessive calorie diet.  As evidenced by BMI >30.    Intervention: Nutrition  education provided on HIgh Fiber Low Fat Diet using My Plate, CHO counting, meal planning, portion sizes, timing of meals, avoiding snacks between meals  , taking medications as  prescribed, benefits of exercising 30 minutes per day and prevention of diabetes. Discussed benefits of a plant based diet and weight loss post breast/kidney cancer. Stressed need for 150 minutes of physical activity a day. Goals 1. Continue to increase fresh fruits and vegetables. 2. Increase water to 5 bottles a day 3. Lose 2-3 lbs per month 4. Add protein-eggs with oatmeal at breakfast. Only  Need 1 packet of oatmeal at breakfast.  .  Teaching Method Utilized:  Visual Auditory Hands on  Handouts given during visit include:  The Plate Method   Meal Plan Card  Barriers to learning/adherence to lifestyle change:  none  Demonstrated degree of understanding via:  Teach Back   Monitoring/Evaluation:  Dietary intake, exercise, meal planning, and body weight in 3 month(s).

## 2017-07-05 ENCOUNTER — Ambulatory Visit: Payer: Medicare Other | Admitting: Gastroenterology

## 2017-07-16 DIAGNOSIS — I10 Essential (primary) hypertension: Secondary | ICD-10-CM | POA: Diagnosis not present

## 2017-07-16 DIAGNOSIS — M5416 Radiculopathy, lumbar region: Secondary | ICD-10-CM | POA: Diagnosis not present

## 2017-07-27 ENCOUNTER — Other Ambulatory Visit: Payer: Self-pay | Admitting: Oncology

## 2017-07-27 DIAGNOSIS — Z1231 Encounter for screening mammogram for malignant neoplasm of breast: Secondary | ICD-10-CM

## 2017-07-28 DIAGNOSIS — M5127 Other intervertebral disc displacement, lumbosacral region: Secondary | ICD-10-CM | POA: Diagnosis not present

## 2017-07-28 DIAGNOSIS — M5416 Radiculopathy, lumbar region: Secondary | ICD-10-CM | POA: Diagnosis not present

## 2017-08-18 ENCOUNTER — Other Ambulatory Visit: Payer: Self-pay | Admitting: Orthopedic Surgery

## 2017-09-10 IMAGING — US US ABDOMEN COMPLETE
1 series · 14 of 25 positions shown · non-contrast
Comparison: None.

CLINICAL DATA: Abdominal pain.

EXAM:
ULTRASOUND ABDOMEN COMPLETE

[Series 1: us abdomen complete · 0.17mm/px · 14 of 120 slices shown]
[im 1/120]
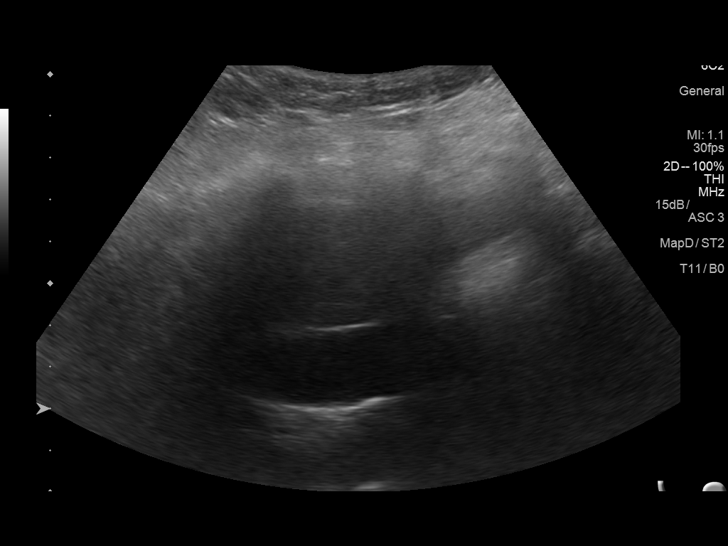
[im 10/120]
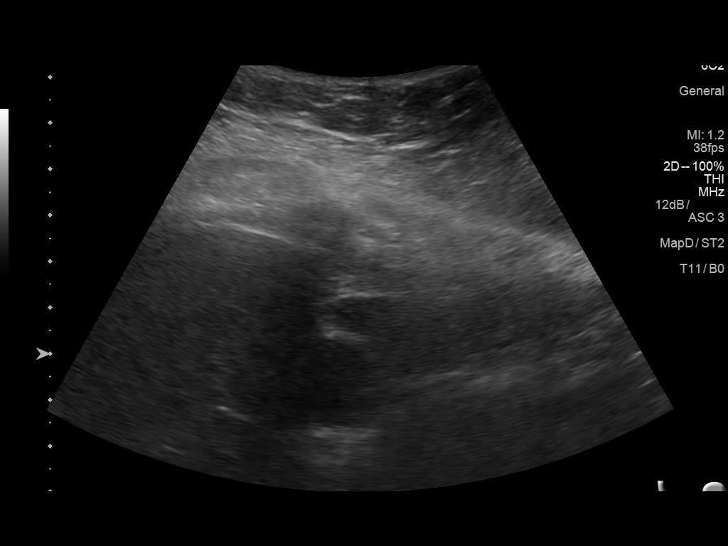
[im 20/120]
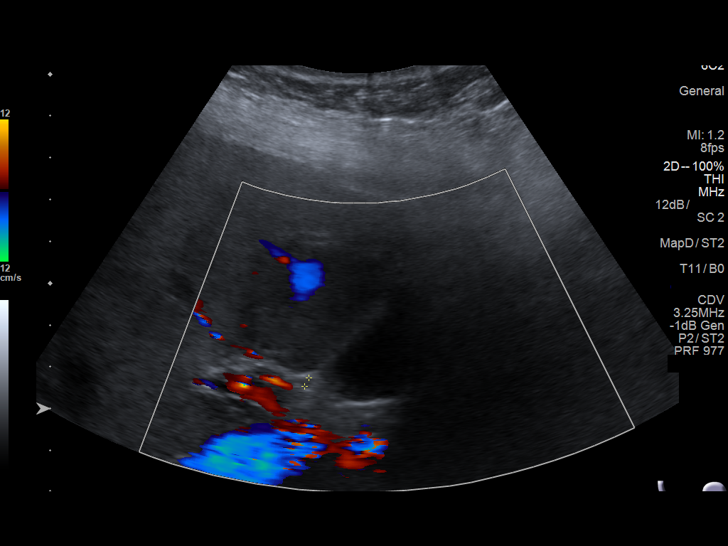
[im 30/120]
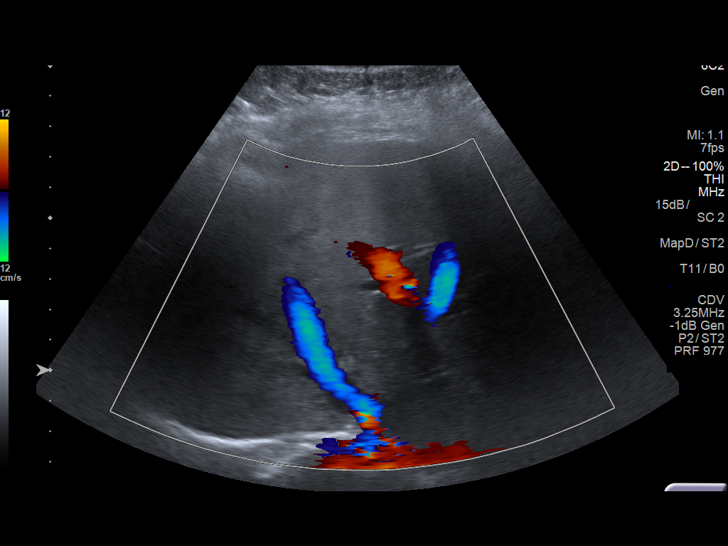
[im 40/120]
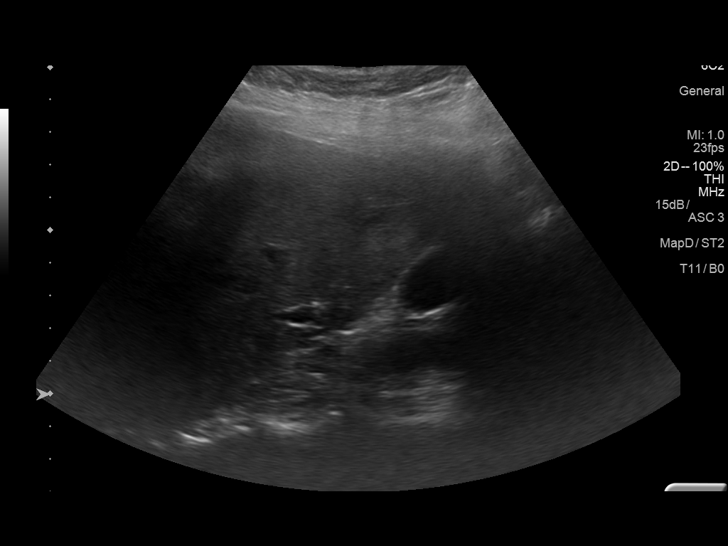
[im 45/120]
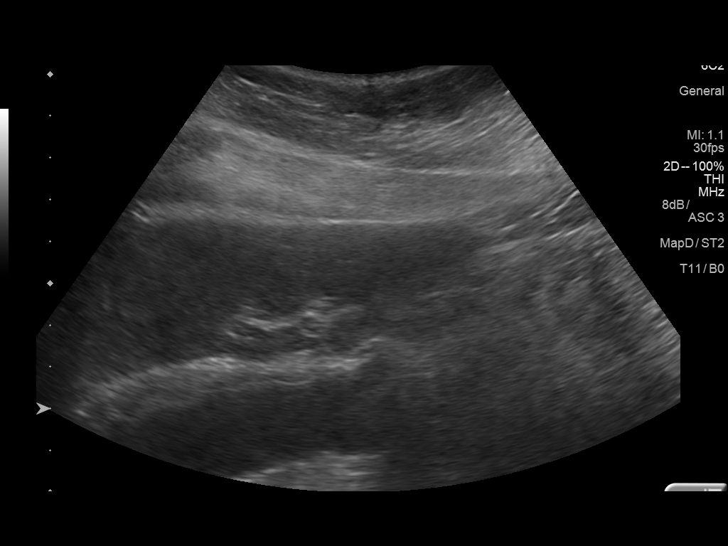
[im 55/120]
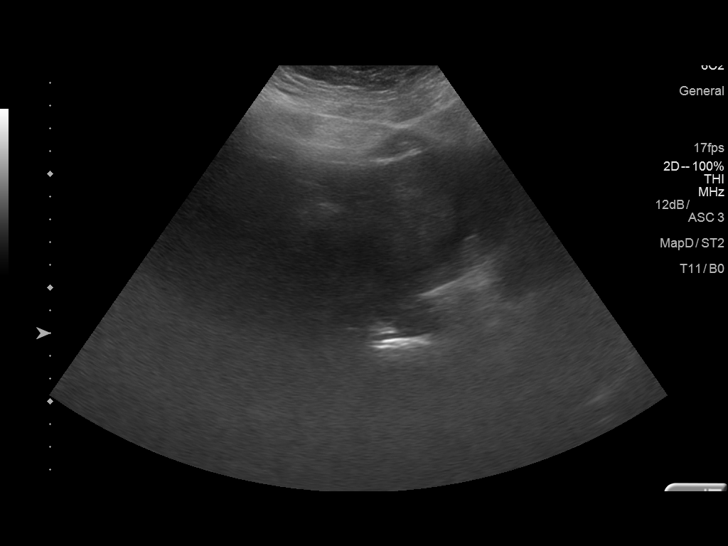
[im 65/120]
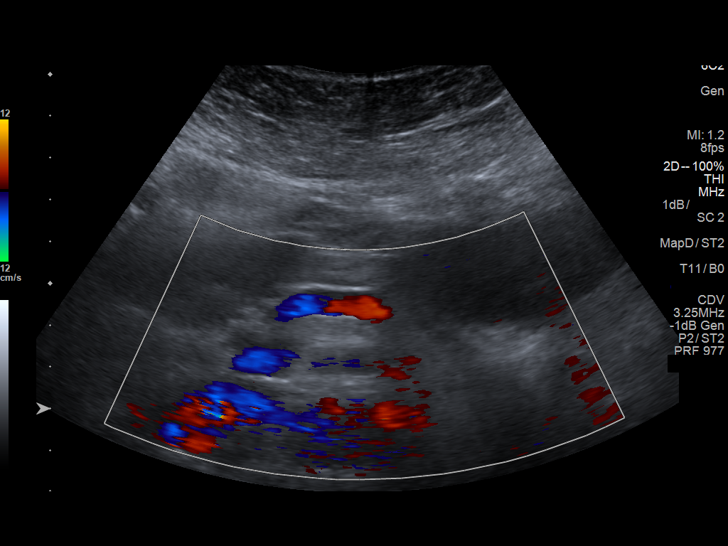
[im 75/120]
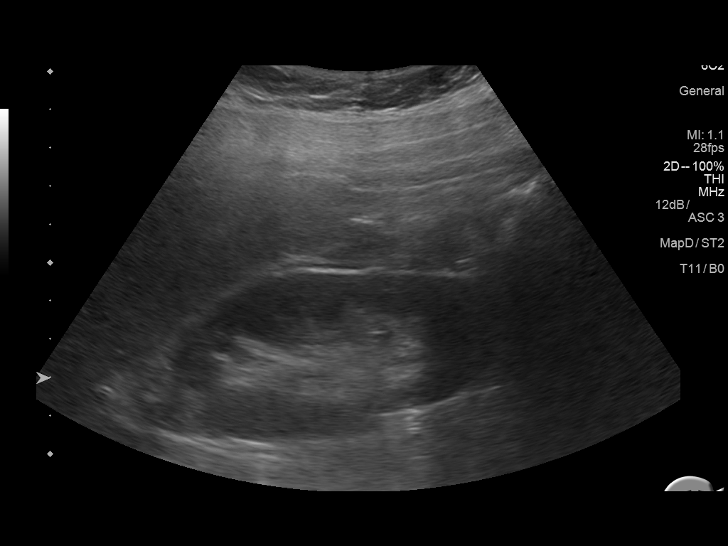
[im 80/120]
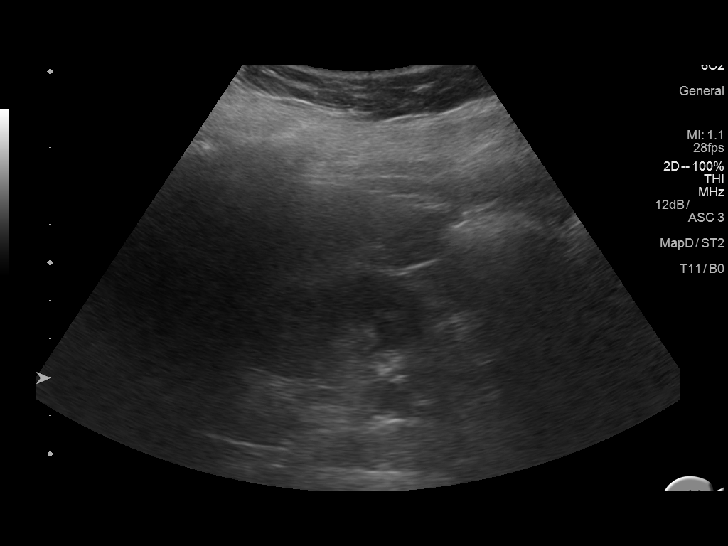
[im 90/120]
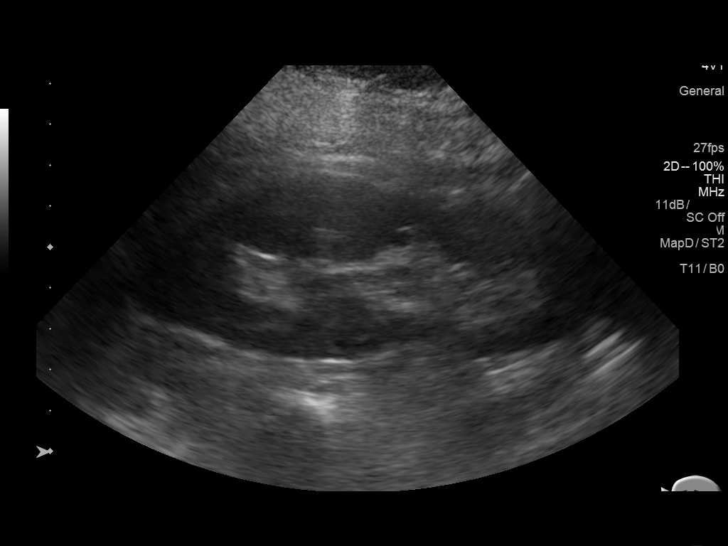
[im 100/120]
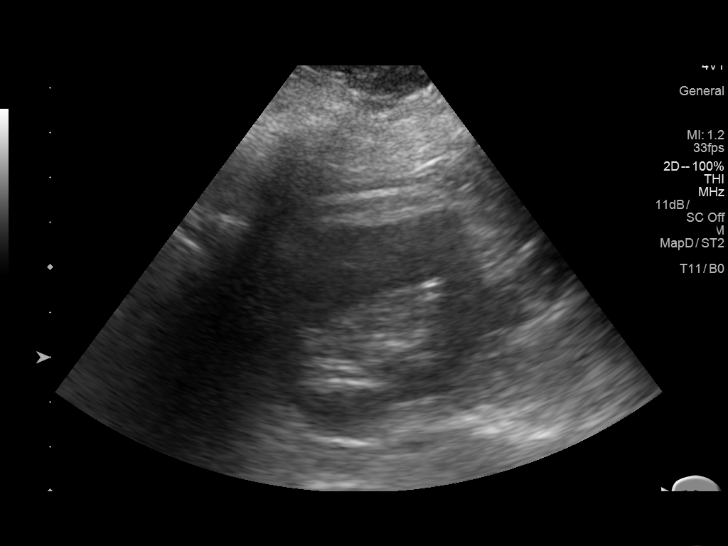
[im 110/120]
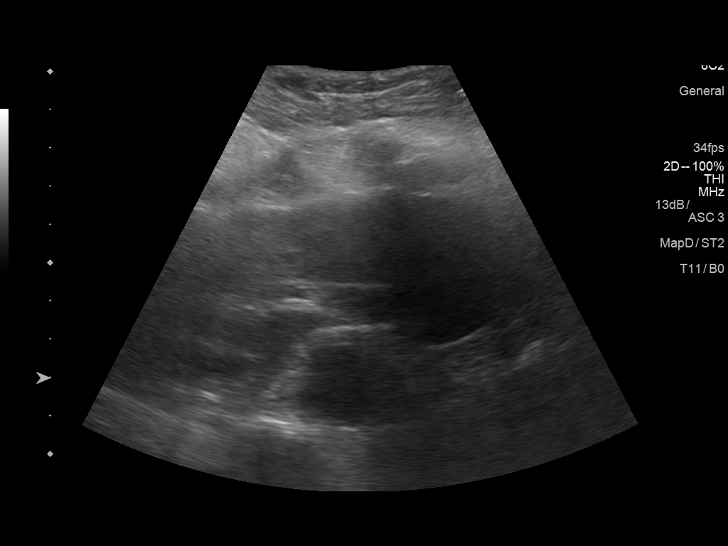
[im 120/120]
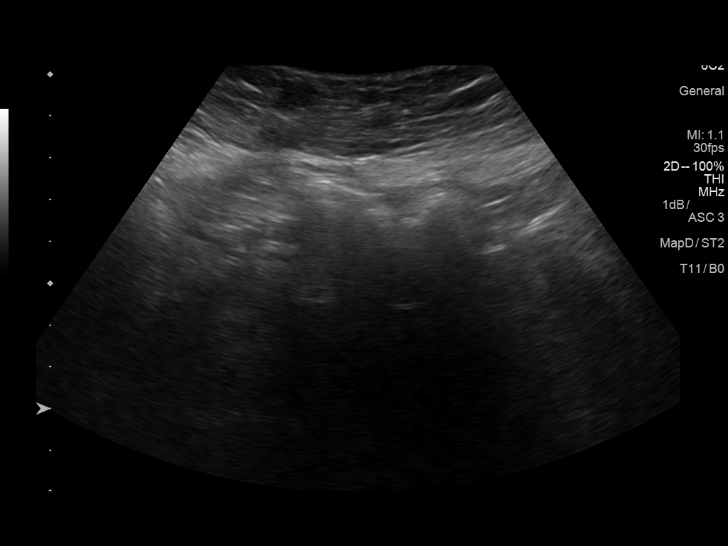

[14 of 25 positions shown; findings below may reference images not displayed]

FINDINGS: Gallbladder: No gallstones or wall thickening visualized. No
sonographic Murphy sign noted.

Common bile duct: Diameter: 3.8 mm

Liver: Liver is echogenic consistent fatty infiltration and/or
hepatocellular disease.

IVC: No abnormality visualized.

Pancreas: Visualized portion unremarkable.

Spleen: Size and appearance within normal limits.

Right Kidney: Length: 11.0 cm. Echogenicity within normal limits. No
mass or hydronephrosis visualized.

Left Kidney: Length: 11.6 cm. Echogenicity within normal limits. No
mass or hydronephrosis visualized.

Abdominal aorta: No aneurysm visualized.

Other findings: None.
IMPRESSION: Echogenic liver consistent with fatty infiltration and/or
hepatocellular disease. No acute abnormality identified.

## 2017-09-15 ENCOUNTER — Ambulatory Visit (HOSPITAL_COMMUNITY)
Admission: RE | Admit: 2017-09-15 | Discharge: 2017-09-15 | Disposition: A | Discharge status: Home / Self Care | Payer: Medicare Other | Source: Ambulatory Visit | Attending: Oncology | Admitting: Oncology

## 2017-09-15 ENCOUNTER — Encounter (HOSPITAL_COMMUNITY): Payer: Self-pay

## 2017-09-15 DIAGNOSIS — Z1231 Encounter for screening mammogram for malignant neoplasm of breast: Secondary | ICD-10-CM | POA: Insufficient documentation

## 2017-09-23 DIAGNOSIS — M5416 Radiculopathy, lumbar region: Secondary | ICD-10-CM | POA: Diagnosis not present

## 2017-09-23 DIAGNOSIS — M961 Postlaminectomy syndrome, not elsewhere classified: Secondary | ICD-10-CM | POA: Diagnosis not present

## 2017-09-23 DIAGNOSIS — M5127 Other intervertebral disc displacement, lumbosacral region: Secondary | ICD-10-CM | POA: Diagnosis not present

## 2017-09-24 ENCOUNTER — Other Ambulatory Visit: Payer: Self-pay | Admitting: Urology

## 2017-09-24 ENCOUNTER — Ambulatory Visit (HOSPITAL_COMMUNITY)
Admission: RE | Admit: 2017-09-24 | Discharge: 2017-09-24 | Disposition: A | Discharge status: Home / Self Care | Payer: Medicare Other | Source: Ambulatory Visit | Attending: Urology | Admitting: Urology

## 2017-09-24 DIAGNOSIS — C641 Malignant neoplasm of right kidney, except renal pelvis: Secondary | ICD-10-CM | POA: Diagnosis not present

## 2017-09-30 ENCOUNTER — Ambulatory Visit: Payer: Medicare Other | Admitting: Nutrition

## 2017-10-01 DIAGNOSIS — C641 Malignant neoplasm of right kidney, except renal pelvis: Secondary | ICD-10-CM | POA: Diagnosis not present

## 2017-10-01 DIAGNOSIS — K760 Fatty (change of) liver, not elsewhere classified: Secondary | ICD-10-CM | POA: Diagnosis not present

## 2017-10-26 DIAGNOSIS — R3915 Urgency of urination: Secondary | ICD-10-CM | POA: Diagnosis not present

## 2017-10-26 DIAGNOSIS — Q625 Duplication of ureter: Secondary | ICD-10-CM | POA: Diagnosis not present

## 2017-10-26 DIAGNOSIS — C641 Malignant neoplasm of right kidney, except renal pelvis: Secondary | ICD-10-CM | POA: Diagnosis not present

## 2017-11-05 ENCOUNTER — Other Ambulatory Visit (HOSPITAL_COMMUNITY): Payer: Self-pay | Admitting: Adult Health

## 2017-11-22 ENCOUNTER — Other Ambulatory Visit (HOSPITAL_COMMUNITY): Payer: Self-pay | Admitting: *Deleted

## 2017-11-22 DIAGNOSIS — I10 Essential (primary) hypertension: Secondary | ICD-10-CM | POA: Diagnosis not present

## 2017-11-22 DIAGNOSIS — N3281 Overactive bladder: Secondary | ICD-10-CM | POA: Diagnosis not present

## 2017-11-22 DIAGNOSIS — E039 Hypothyroidism, unspecified: Secondary | ICD-10-CM | POA: Diagnosis not present

## 2017-11-22 DIAGNOSIS — K589 Irritable bowel syndrome without diarrhea: Secondary | ICD-10-CM | POA: Diagnosis not present

## 2017-11-22 DIAGNOSIS — C649 Malignant neoplasm of unspecified kidney, except renal pelvis: Secondary | ICD-10-CM

## 2017-11-23 ENCOUNTER — Inpatient Hospital Stay (HOSPITAL_COMMUNITY): Payer: Medicare Other | Attending: Internal Medicine

## 2017-11-23 DIAGNOSIS — C641 Malignant neoplasm of right kidney, except renal pelvis: Secondary | ICD-10-CM | POA: Diagnosis not present

## 2017-11-23 DIAGNOSIS — D0501 Lobular carcinoma in situ of right breast: Secondary | ICD-10-CM | POA: Diagnosis not present

## 2017-11-23 DIAGNOSIS — C649 Malignant neoplasm of unspecified kidney, except renal pelvis: Secondary | ICD-10-CM

## 2017-11-23 DIAGNOSIS — D0511 Intraductal carcinoma in situ of right breast: Secondary | ICD-10-CM | POA: Diagnosis not present

## 2017-11-23 LAB — CBC WITH DIFFERENTIAL/PLATELET
Basophils Absolute: 0 10*3/uL (ref 0.0–0.1)
Basophils Relative: 0 %
Eosinophils Absolute: 0.1 10*3/uL (ref 0.0–0.7)
Eosinophils Relative: 2 %
HCT: 39.8 % (ref 36.0–46.0)
Hemoglobin: 13 g/dL (ref 12.0–15.0)
Lymphocytes Relative: 38 %
Lymphs Abs: 2.5 10*3/uL (ref 0.7–4.0)
MCH: 31.1 pg (ref 26.0–34.0)
MCHC: 32.7 g/dL (ref 30.0–36.0)
MCV: 95.2 fL (ref 78.0–100.0)
Monocytes Absolute: 0.4 10*3/uL (ref 0.1–1.0)
Monocytes Relative: 6 %
Neutro Abs: 3.6 10*3/uL (ref 1.7–7.7)
Neutrophils Relative %: 54 %
Platelets: 215 10*3/uL (ref 150–400)
RBC: 4.18 MIL/uL (ref 3.87–5.11)
RDW: 13 % (ref 11.5–15.5)
WBC: 6.6 10*3/uL (ref 4.0–10.5)

## 2017-11-23 LAB — COMPREHENSIVE METABOLIC PANEL
ALT: 31 U/L (ref 14–54)
AST: 45 U/L — ABNORMAL HIGH (ref 15–41)
Albumin: 3.6 g/dL (ref 3.5–5.0)
Alkaline Phosphatase: 47 U/L (ref 38–126)
Anion gap: 12 (ref 5–15)
BUN: 8 mg/dL (ref 6–20)
CO2: 25 mmol/L (ref 22–32)
Calcium: 8.9 mg/dL (ref 8.9–10.3)
Chloride: 99 mmol/L — ABNORMAL LOW (ref 101–111)
Creatinine, Ser: 0.85 mg/dL (ref 0.44–1.00)
GFR calc Af Amer: >60 mL/min (ref >60)
GFR calc non Af Amer: >60 mL/min (ref >60)
Glucose, Bld: 108 mg/dL — ABNORMAL HIGH (ref 65–99)
Potassium: 3.4 mmol/L — ABNORMAL LOW (ref 3.5–5.1)
Sodium: 136 mmol/L (ref 135–145)
Total Bilirubin: 0.5 mg/dL (ref 0.3–1.2)
Total Protein: 7.3 g/dL (ref 6.5–8.1)

## 2017-11-23 LAB — LACTATE DEHYDROGENASE: LDH: 135 U/L (ref 98–192)

## 2017-11-30 ENCOUNTER — Encounter (HOSPITAL_COMMUNITY): Payer: Self-pay | Admitting: Internal Medicine

## 2017-11-30 ENCOUNTER — Other Ambulatory Visit (HOSPITAL_COMMUNITY): Payer: Self-pay | Admitting: Internal Medicine

## 2017-11-30 ENCOUNTER — Other Ambulatory Visit: Payer: Self-pay

## 2017-11-30 ENCOUNTER — Inpatient Hospital Stay (HOSPITAL_BASED_OUTPATIENT_CLINIC_OR_DEPARTMENT_OTHER): Payer: Medicare Other | Admitting: Internal Medicine

## 2017-11-30 ENCOUNTER — Encounter: Payer: Self-pay | Admitting: Internal Medicine

## 2017-11-30 VITALS — BP 131/68 | HR 64 | Temp 98.0°F | Resp 16 | Wt 212.7 lb

## 2017-11-30 DIAGNOSIS — I10 Essential (primary) hypertension: Secondary | ICD-10-CM

## 2017-11-30 DIAGNOSIS — C641 Malignant neoplasm of right kidney, except renal pelvis: Secondary | ICD-10-CM | POA: Diagnosis not present

## 2017-11-30 DIAGNOSIS — Z87891 Personal history of nicotine dependence: Secondary | ICD-10-CM | POA: Diagnosis not present

## 2017-11-30 DIAGNOSIS — Z79810 Long term (current) use of selective estrogen receptor modulators (SERMs): Secondary | ICD-10-CM

## 2017-11-30 DIAGNOSIS — D0501 Lobular carcinoma in situ of right breast: Secondary | ICD-10-CM

## 2017-11-30 DIAGNOSIS — D0511 Intraductal carcinoma in situ of right breast: Secondary | ICD-10-CM | POA: Diagnosis not present

## 2017-11-30 DIAGNOSIS — Z1231 Encounter for screening mammogram for malignant neoplasm of breast: Secondary | ICD-10-CM

## 2017-11-30 NOTE — Patient Instructions (Signed)
[place] at [facility] Discharge Instructions   You were seen today by Dr. Zoila Shutter. We will refer you back to Dr. Gala Romney for your colonoscopy. Dr. Walden Field went over your recent labs and they were good. Make sure you continue to follow up with your kidney doctor. Continue taking your tamoxifen. Your mammogram is due in December of this year. We will see you back in late December, early January after your mammogram is done.    Thank you for choosing [place] at [facility] to provide your oncology and hematology care.  To afford each patient quality time with our provider, please arrive at least 15 minutes before your scheduled appointment time.    If you have a lab appointment with the [facility] please come in thru the  Main Entrance and check in at the main information desk  You need to re-schedule your appointment should you arrive 10 or more minutes late.  We strive to give you quality time with our providers, and arriving late affects you and other patients whose appointments are after yours.  Also, if you no show three or more times for appointments you may be dismissed from the clinic at the providers discretion.     Again, thank you for choosing [facility].  Our hope is that these requests will decrease the amount of time that you wait before being seen by our physicians.       _____________________________________________________________  Should you have questions after your visit to [facility], please contact our office at [phone] between the hours of 8:30 a.m. and 4:30 p.m.  Voicemails left after 4:30 p.m. will not be returned until the following business day.  For prescription refill requests, have your pharmacy contact our office.       Resources For Cancer Patients and their Caregivers ? American Cancer Society: Can assist with transportation, wigs, general needs, runs Look Good Feel  Better.        718-612-7236 ? Cancer Care: Provides financial assistance, online support groups, medication/co-pay assistance.  1-800-813-HOPE (469)501-0450) ? [place] Assists [employer] [place] cancer patients and their families through emotional , educational and financial support.  224-643-8415 ? [place] DSS Where to apply for food stamps, Medicaid and utility assistance. 979-585-1032 ? [facility]: Transportation to medical appointments. (704) 113-4358 ? Social Security Administration: May apply for disability if have a Stage IV cancer. 8434005230 2602232506 ? [employer], Disability and Transit Services: Assists with nutrition, care and transit needs. [place] Support Programs:   > Cancer Support Group  2nd Tuesday of the month 1pm-2pm, Journey Room   > Creative Journey  3rd Tuesday of the month 1130am-1pm, Journey Room

## 2017-12-27 DIAGNOSIS — R1310 Dysphagia, unspecified: Secondary | ICD-10-CM | POA: Diagnosis not present

## 2017-12-27 DIAGNOSIS — M153 Secondary multiple arthritis: Secondary | ICD-10-CM | POA: Diagnosis not present

## 2017-12-27 DIAGNOSIS — R490 Dysphonia: Secondary | ICD-10-CM | POA: Diagnosis not present

## 2017-12-27 DIAGNOSIS — E039 Hypothyroidism, unspecified: Secondary | ICD-10-CM | POA: Diagnosis not present

## 2017-12-28 DIAGNOSIS — I10 Essential (primary) hypertension: Secondary | ICD-10-CM | POA: Diagnosis not present

## 2017-12-28 DIAGNOSIS — E039 Hypothyroidism, unspecified: Secondary | ICD-10-CM | POA: Diagnosis not present

## 2017-12-29 NOTE — Progress Notes (Signed)
Diagnosis Ductal carcinoma in situ (DCIS) of right breast - Plan: MM Digital Screening Unilat L  Staging Cancer Staging Malignant neoplasm of upper outer quadrant of female breast ([place]) Staging form: Breast, AJCC 7th Edition - Clinical: Stage 0 (Tis (DCIS), N0, M0) - Signed by [name], MD on 10/11/2014  Renal cell carcinoma [facility]) Staging form: Kidney, AJCC 7th Edition - Clinical stage from 06/02/2016: Stage Unknown (T1a, NX, M0) - Signed by [name] Cancer, PA-C on 06/02/2016  CURRENT THERAPY: Tamoxifen daily beginning on 01/01/2015  Assessment and Plan:  1. Extensive DCIS/LCIS of right breast, S/P simple right mastectomy and SLN which was negative for metastatic disease on 11/20/2014 by Dr. Brantley Stage.  She is S/P hysterectomy with ovarian remnants (CT imaging 11/26/2015).  Patient is planned for tamoxifen for 5 years which she began in 2016.  Recent mammogram done 09/15/2017 showed IMPRESSION: No mammographic evidence of malignancy.   She will undergo left screening mammogram in December 2019 and will follow-up to go over the results.  2.  T1 a NX right renal cell cancer grade 2.  Pt is S/P renal wedge resection/partial resection by Dr. Tresa Moore on 01/08/2016.  Continue to follow-up with urology as recommended.  3.  Hypertension.  Blood pressure is 131/68.  Follow-up with PCP.  INTERVAL HISTORY:72 y.o. female returns for followup of extensive DCIS/LCIS of right breast, S/P simple right mastectomy and SLN which was negative for metastatic disease on 11/20/2014 by Dr. Brantley Stage. She is S/P hysterectomy with ovarian remnants (CT imaging 11/26/2015).    Pt also has H/O right renal cell carcinoma, grade 2, S/P renal wedge resection/partial resection by Dr. Tresa Moore on 01/08/2016.  Stage: pT1ApNX.  Current status.  Patient is seen today for follow-up.  She is here to go over recent mammogram.  She remains on tamoxifen.    Malignant neoplasm of upper outer quadrant of female breast ([place])   09/05/2014  Imaging    assymetry noted in R breast on screening mammogram      09/18/2014 Imaging    Irregular mass in the 12 o'clock location of the right breast. Tissue diagnosis is recommended.      09/18/2014 Imaging    IMPRESSION: Irregular mass in the 12 o'clock location of the right breast. Tissue diagnosis is recommended.      10/09/2014 Imaging    8 x 12 x 8 CM abnormal linear clumped enhancement throughout the majority of the central and upper right breast.      10/17/2014 Initial Biopsy    Breast, right, needle core biopsy, central - DUCTAL CARCINOMA IN SITU, MIXED PATTERNS (CRIBRIFORM, COMEDO AND PAPILLARY), INVOLVING MULTIPLE CORES, AT LEAST 9 MM IN MAXIMAL EXTENT IN ANY ONE CORE. - FOCAL LOBULAR NEOPLASIA (ALH/LCIS). - NO INVASIVE CAR      11/20/2014 Definitive Surgery    Breast, simple mastectomy, right - DUCTAL CARCINOMA IN SITU, SEE COMMENT. - LOBULAR CARCINOMA IN SITU (CONVENTIONAL AND PLEOMORPHIC TYPES). - PREVIOUS BIOPSY SITES (X2). - IN SITU CARCINOMA IS 3CM FROM NEAREST MARGIN (DEEP).      01/01/2015 -  Anti-estrogen oral therapy    Tamoxifen daily.      07/21/2016 Breast US    BIRADS 4      07/29/2016 Procedure    Ultrasound guided biopsy of right axillary mass/lymph node. No apparent complications.      07/30/2016 Pathology Results    Pathology of the right axillary lymph node biopsy revealed LYMPHOID TISSUE WITH DERMATOPATHIC CHANGE. NO METASTATIC CARCINOMA IDENTIFIED.  Renal cell carcinoma ([place])   01/08/2016 Procedure    Robotic-assisted laparoscopic right partial nephrectomy by Dr. Tresa Moore.      01/08/2016 Pathology Results    Kidney, wedge excision / partial resection, right renal mass RENAL CELL CARCINOMA, CLEAR CELL TYPE, WHO NUCLEAR GRADE 2 (2.4 CM) THE TUMOR IS CONFINED TO THE KIDNEY MARGINS OF RESECTION ARE NEGATIVE FOR TUMOR        Problem List Patient Active Problem List   Diagnosis Date Noted  . Renal cell carcinoma ([place]) [C64.9]  01/08/2016  . Right kidney mass [N28.89] 12/23/2015  . Liver lesion [K76.9] 12/23/2015  . Malignant neoplasm of upper outer quadrant of female breast ([place]) [C50.419] 10/11/2014  . S/P total knee replacement [Z96.659] 08/08/2014  . Difficulty in walking(719.7) [R26.2] 02/15/2014  . Postoperative stiffness of total knee replacement ([place]) [T84.89XA, M25.669, Z96.659] 02/15/2014  . Pain in joint, lower leg [M25.569] 02/15/2014  . Abnormality of gait [R26.9] 10/11/2013  . S/P arthroscopy of right knee [Z98.890] 10/09/2013  . Radicular pain of right lower extremity [M54.10] 07/25/2013  . Knee instability [M25.369] 07/25/2013  . Right knee pain [M25.561] 07/25/2013  . Chest pain [R07.9] 04/25/2013  . Patellar tendonitis [M76.50] 09/07/2012  . OA (osteoarthritis) of knee [M17.10] 02/09/2012  . GERD (gastroesophageal reflux disease) [K21.9] 12/01/2011  . Dysphagia [787.2] 12/01/2011  . RUQ pain [R10.11] 12/01/2011  . Fatty liver [K76.0] 12/01/2011  . Hematochezia [K92.1] 12/01/2011  . INTERMITTENT VERTIGO [R42] 09/12/2010  . KNEE, ARTHRITIS, DEGEN./OSTEO [M17.10] 04/03/2009  . CHEST PAIN, RECURRENT [R07.9] 03/07/2009  . OTHER DYSPHAGIA [R13.19] 03/07/2009  . PALPITATIONS [R00.2] 02/06/2009  . LIVER FUNCTION TESTS, ABNORMAL, HX OF [Z86.39, Z86.2] 01/01/2009  . MORBID OBESITY [E66.01] 12/31/2008  . PUD [K27.9] 12/31/2008  . BACK PAIN [M54.9] 12/24/2008  . ANSERINE BURSITIS [POE4235] 12/24/2008  . BENIGN POSITIONAL VERTIGO [H81.10] 07/16/2008  . H N P-LUMBAR [M51.26] 04/02/2008  . SPINAL STENOSIS, CERVICAL [M48.02] 02/15/2008  . SPINAL STENOSIS, LUMBAR [M48.061] 02/15/2008  . Patellar tendinitis [M76.50] 10/19/2007  . TEAR LATERAL MENISCUS [S83.289A] 06/13/2007  . OBESITY NOS [E66.9] 10/29/2006  . ALLERGIC RHINITIS, SEASONAL [J30.1] 10/29/2006  . HYPOTHYROIDISM [E03.9] 09/07/2006  . HYPERLIPIDEMIA [E78.5] 09/07/2006  . HYPERTENSION [I10] 09/07/2006  . GERD [K21.9] 09/07/2006  .  DIVERTICULOSIS, COLON [K57.30] 09/07/2006  . OVERACTIVE BLADDER [N31.8] 09/07/2006  . FIBROCYSTIC BREAST DISEASE [N60.19] 09/07/2006  . OSTEOARTHRITIS [M19.90] 09/07/2006    Past Medical History Past Medical History:  Diagnosis Date  . Allergic rhinitis due to pollen   . Anxiety   . Arthritis    "knees, back, right elbow; left shoulder; right ankle" (11/20/2014)  . Breast cancer, right breast ([place])    "DCIS; zero stage" S/P mastectomy 11/20/2014  . Cervical spondylosis without myelopathy    all over  . Cervicalgia   . Chronic lower back pain   . Constipation    takes Miralax daily  . Degeneration of cervical intervertebral disc   . Depression   . Diverticulosis of colon (without mention of hemorrhage)   . GERD (gastroesophageal reflux disease)    takes Omeprazole daily  . Graves' disease    "took a pill to correct"  . H/O hiatal hernia   . H/O urinary frequency   . Headache(784.0)    denies migraines since the 80's but has occ and takes Butalbital prn  . HTN (hypertension)    takes Metoprolol and Lisinopril daily  . Hypertension   . Insomnia    takes Ativan nigtly  . Joint pain    "  all of them"  . Joint swelling    "knees, legs, ankles sometimes" (11/20/2014)  . Morbid obesity ([place])   . Numbness    LOWER LEGS  . Other postablative hypothyroidism    takes SYnthroid daily  . Palpitations   . Peptic ulcer, unspecified site, unspecified as acute or chronic, without mention of hemorrhage, perforation, or obstruction   . Primary localized osteoarthrosis, lower leg   . Pure hypercholesterolemia    takes Pravastin daily  . Recurrent UTI   . Renal cell carcinoma ([place]) 01/08/2016  . Renal mass    RIGHT  . Shortness of breath   . Sleep apnea    slight but doesn't require a CPAP (11/20/2014)  . Tendonitis of knee    bilateral    Past Surgical History Past Surgical History:  Procedure Laterality Date  . ABDOMINAL HYSTERECTOMY    . ANTERIOR LAT LUMBAR FUSION  05/13/2012    Procedure: ANTERIOR LATERAL LUMBAR FUSION 2 LEVELS;  Surgeon: Faythe Ghee, MD;  Location: [facility];  Service: Neurosurgery;  Laterality: Left;  Left Lumbar Three-four,Lumbar four-five Extreme Lumbar Interbody Fusion with Percutaneous Pedicle Screws  . APPENDECTOMY    . BREAST BIOPSY Left ~ 2014  . BREAST BIOPSY Right 2015  . BREAST LUMPECTOMY Left 1988  . BREAST RECONSTRUCTION WITH PLACEMENT OF TISSUE EXPANDER AND FLEX HD (ACELLULAR HYDRATED DERMIS) Right 11/20/2014  . BREAST RECONSTRUCTION WITH PLACEMENT OF TISSUE EXPANDER AND FLEX HD (ACELLULAR HYDRATED DERMIS) Right 11/20/2014   Procedure: RIGHT BREAST RECONSTRUCTION WITH PLACEMENT OF TISSUE EXPANDER AND ACELLULAR DERMA MATRIX (ACELLULAR DERMA MATRIX);  Surgeon: Crissie Reese, MD;  Location: [facility];  Service: Plastics;  Laterality: Right;  . CATARACT EXTRACTION W/ INTRAOCULAR LENS  IMPLANT, BILATERAL Bilateral   . COLONOSCOPY  Sept 2008   RMR: normal rectum, left-sided diverticula, repeat in 2018  . DIAGNOSTIC LAPAROSCOPY    . DILATION AND CURETTAGE OF UTERUS    . ESOPHAGOGASTRODUODENOSCOPY    . ESOPHAGOGASTRODUODENOSCOPY  2013   Dr. Gala Romney: Cervical esophageal web and Schatzki's ring s/p dilation, small hiatal hernia, antral and duodenal erosions likely NSAID effect, chronic duodenitis on path   . FOREIGN BODY REMOVAL Right 10/06/2013   Procedure: REMOVAL FOREIGN BODY EXTREMITY;  Surgeon: Carole Civil, MD;  Location: [facility];  Service: Orthopedics;  Laterality: Right;  . INJECTION KNEE Left 10/06/2013   Procedure: KNEE INJECTION;  Surgeon: Carole Civil, MD;  Location: [facility];  Service: Orthopedics;  Laterality: Left;  . JOINT REPLACEMENT    . KNEE ARTHROSCOPY Right   . KNEE ARTHROSCOPY WITH LATERAL MENISECTOMY Right 10/06/2013   Procedure: KNEE ARTHROSCOPY WITH LATERAL AND MEDIAL MENISECTOMY;  Surgeon: Carole Civil, MD;  Location: [facility];  Service: Orthopedics;  Laterality: Right;  END @ 1234  . lumpectomy on pelvis      "mass of nerves"  . MASTECTOMY COMPLETE / SIMPLE W/ SENTINEL NODE BIOPSY Right 11/20/2014   axillary  . REMOVAL OF TISSUE EXPANDER AND PLACEMENT OF IMPLANT Right 12/13/2014   Procedure: REMOVAL OF RIGHT BREAST TISSUE EXPANDER;  Surgeon: Crissie Reese, MD;  Location: [facility];  Service: Plastics;  Laterality: Right;  . ROBOTIC ASSITED PARTIAL NEPHRECTOMY Right 01/08/2016   Procedure: XI ROBOTIC ASSITED PARTIAL NEPHRECTOMY;  Surgeon: Alexis Frock, MD;  Location: [facility];  Service: Urology;  Laterality: Right;  . SIMPLE MASTECTOMY WITH AXILLARY SENTINEL NODE BIOPSY Right 11/20/2014   Procedure: SIMPLE MASTECTOMY WITH AXILLARY SENTINEL NODE BIOPSY;  Surgeon: Erroll Luna, MD;  [facility];  Service: General;  Laterality: Right;  . TISSUE EXPANDER REMOVAL Right 12/13/2014   dr Harlow Mares  . TONSILLECTOMY    . TOTAL KNEE ARTHROPLASTY Right 01/24/2014   Procedure: TOTAL KNEE ARTHROPLASTY;  Surgeon: Carole Civil, MD;  Location: [facility];  Service: Orthopedics;  Laterality: Right;  . TOTAL KNEE ARTHROPLASTY Left 08/08/2014   Procedure: TOTAL KNEE ARTHROPLASTY;  Surgeon: Carole Civil, MD;  Location: [facility];  Service: Orthopedics;  Laterality: Left;  . TUBAL LIGATION    . UPPER GASTROINTESTINAL ENDOSCOPY      Family History Family History  Problem Relation Age of Onset  . Stroke Sister   . Osteoarthritis Sister   . Osteoarthritis Brother   . Obesity Brother   . Heart attack Sister   . Heart attack Father   . Lung cancer Maternal Grandmother        non-smoker  . Heart attack Unknown        2 Brothers, 3 sisters/Father died in 21's from old age  . Colon cancer Neg Hx        Mother died at 64 from blood clot, MI     Social History  reports that she quit smoking about 32 years ago. Her smoking use included cigarettes. She has a 27.00 pack-year smoking history. She has never used smokeless tobacco. She reports that she drinks about 2.4 oz of alcohol per week. She reports that she does not use  drugs.  Medications  Current Outpatient Medications:  .  acetaminophen (TYLENOL) 500 MG tablet, Take 1,000 mg by mouth every 6 (six) hours as needed for moderate pain. , Disp: , Rfl:  .  diphenhydrAMINE (SOMINEX) 25 MG tablet, Take 50 mg by mouth at bedtime as needed for allergies or sleep., Disp: , Rfl:  .  fluticasone (FLONASE) 50 MCG/ACT nasal spray, Place 2 sprays into the nose at bedtime. , Disp: , Rfl:  .  gabapentin (NEURONTIN) 600 MG tablet, TAKE 1/2 TABLET BY MOUTH  DURING THE DAY AND 1 TABLET AT BEDTIME, Disp: 135 tablet, Rfl: 5 .  hydrochlorothiazide (HYDRODIURIL) 25 MG tablet, Take 25 mg by mouth daily. , Disp: , Rfl:  .  levothyroxine (SYNTHROID, LEVOTHROID) 88 MCG tablet, Take 88 mcg by mouth daily before breakfast. , Disp: , Rfl:  .  LORazepam (ATIVAN) 1 MG tablet, Take 1 mg by mouth at bedtime as needed for sleep. For sleep, Disp: , Rfl:  .  metoprolol tartrate (LOPRESSOR) 25 MG tablet, Take 25 mg by mouth 2 (two) times daily. , Disp: , Rfl:  .  omeprazole (PRILOSEC) 40 MG capsule, Take 40 mg by mouth daily before breakfast. Takes one tablet every other day, Disp: , Rfl:  .  oxybutynin (DITROPAN) 5 MG tablet, Take 5 mg by mouth at bedtime. Reported on 01/30/2016, Disp: , Rfl:  .  pravastatin (PRAVACHOL) 40 MG tablet, Take 40 mg by mouth every morning. , Disp: , Rfl:  .  sertraline (ZOLOFT) 50 MG tablet, Take 1 tablet (50 mg total) by mouth daily., Disp: 90 tablet, Rfl: 3 .  tamoxifen (NOLVADEX) 20 MG tablet, TAKE 1 TABLET BY MOUTH  DAILY, Disp: 90 tablet, Rfl: 1 .  tamoxifen (NOLVADEX) 20 MG tablet, TAKE 1 TABLET BY MOUTH  DAILY, Disp: 90 tablet, Rfl: 0  Allergies Propoxyphene hcl; Pollen extract; Camphor; Dust mite extract; Latex; Molds & smuts; and Tomato  Review of Systems Review of Systems - Oncology ROS as per HPI otherwise 12 point ROS is negative.  Physical Exam  Vitals Wt Readings from Last 3 Encounters:  11/30/17 212 lb 11.2 oz (96.5 kg)  06/30/17 213 lb  (96.6 kg)  05/26/17 216 lb 6.4 oz (98.2 kg)   Temp Readings from Last 3 Encounters:  11/30/17 98 F (36.7 C) (Oral)  05/26/17 98.1 F (36.7 C) (Oral)  11/02/16 98 F (36.7 C) (Oral)   BP Readings from Last 3 Encounters:  11/30/17 131/68  05/26/17 (!) 143/79  05/15/17 126/81   Pulse Readings from Last 3 Encounters:  11/30/17 64  05/26/17 67  05/15/17 (!) 58   Constitutional: Well-developed, well-nourished, and in no distress.   HENT: Head: Normocephalic and atraumatic.  Mouth/Throat: No oropharyngeal exudate. Mucosa moist. Eyes: Pupils are equal, round, and reactive to light. Conjunctivae are normal. No scleral icterus.  Neck: Normal range of motion. Neck supple. No JVD present.  Cardiovascular: Normal rate, regular rhythm and normal heart sounds.  Exam reveals no gallop and no friction rub.   No murmur heard. Pulmonary/Chest: Effort normal and breath sounds normal. No respiratory distress. No wheezes.No rales.  Abdominal: Soft. Bowel sounds are normal. No distension. There is no tenderness. There is no guarding.  Musculoskeletal: No edema or tenderness.  Lymphadenopathy: No cervical, axillary or supraclavicular adenopathy.  Neurological: Alert and oriented to person, place, and time. No cranial nerve deficit.  Skin: Skin is warm and dry. No rash noted. No erythema. No pallor.  Psychiatric: Affect and judgment normal.  Breast exam: Chaperone present.  Right mastectomy site shows no signs of scar recurrence.  Left breast shows no palpable dominant masses.  Labs No visits with results within 3 Day(s) from this visit.  Latest known visit with results is:  Appointment on 11/23/2017  Component Date Value Ref Range Status  . WBC 11/23/2017 6.6  4.0 - 10.5 K/uL Final  . RBC 11/23/2017 4.18  3.87 - 5.11 MIL/uL Final  . Hemoglobin 11/23/2017 13.0  12.0 - 15.0 g/dL Final  . HCT 11/23/2017 39.8  36.0 - 46.0 % Final  . MCV 11/23/2017 95.2  78.0 - 100.0 fL Final  . MCH 11/23/2017  31.1  26.0 - 34.0 pg Final  . MCHC 11/23/2017 32.7  30.0 - 36.0 g/dL Final  . RDW 11/23/2017 13.0  11.5 - 15.5 % Final  . Platelets 11/23/2017 215  150 - 400 K/uL Final  . Neutrophils Relative % 11/23/2017 54  % Final  . Neutro Abs 11/23/2017 3.6  1.7 - 7.7 K/uL Final  . Lymphocytes Relative 11/23/2017 38  % Final  . Lymphs Abs 11/23/2017 2.5  0.7 - 4.0 K/uL Final  . Monocytes Relative 11/23/2017 6  % Final  . Monocytes Absolute 11/23/2017 0.4  0.1 - 1.0 K/uL Final  . Eosinophils Relative 11/23/2017 2  % Final  . Eosinophils Absolute 11/23/2017 0.1  0.0 - 0.7 K/uL Final  . Basophils Relative 11/23/2017 0  % Final  . Basophils Absolute 11/23/2017 0.0  0.0 - 0.1 K/uL Final   Performed at [facility], [address] [place], [place] [address]  . Sodium 11/23/2017 136  135 - 145 mmol/L Final  . Potassium 11/23/2017 3.4* 3.5 - 5.1 mmol/L Final  . Chloride 11/23/2017 99* 101 - 111 mmol/L Final  . CO2 11/23/2017 25  22 - 32 mmol/L Final  . Glucose, Bld 11/23/2017 108* 65 - 99 mg/dL Final  . BUN 11/23/2017 8  6 - 20 mg/dL Final  . Creatinine, Ser 11/23/2017 0.85  0.44 - 1.00 mg/dL Final  . Calcium  11/23/2017 8.9  8.9 - 10.3 mg/dL Final  . Total Protein 11/23/2017 7.3  6.5 - 8.1 g/dL Final  . Albumin 11/23/2017 3.6  3.5 - 5.0 g/dL Final  . AST 11/23/2017 45* 15 - 41 U/L Final  . ALT 11/23/2017 31  14 - 54 U/L Final  . Alkaline Phosphatase 11/23/2017 47  38 - 126 U/L Final  . Total Bilirubin 11/23/2017 0.5  0.3 - 1.2 mg/dL Final  . GFR calc non Af Amer 11/23/2017 >60  >60 mL/min Final  . GFR calc Af Amer 11/23/2017 >60  >60 mL/min Final   Comment: (NOTE) The eGFR has been calculated using the CKD EPI equation. This calculation has not been validated in all clinical situations. eGFR's persistently <60 mL/min signify possible Chronic Kidney Disease.   Georgiann Hahn gap 11/23/2017 12  5 - 15 Final   Performed at [facility], [address], [place], [place] [address]  . LDH 11/23/2017 135  98 -  192 U/L Final   Performed at [facility], [address] [place], [place] [address]     Pathology Orders Placed This Encounter  Procedures  . MM Digital Screening Unilat L    Standing Status:   Future    Standing Expiration Date:   11/30/2018    Order Specific Question:   Reason for Exam (SYMPTOM  OR DIAGNOSIS REQUIRED)    Answer:   right breast DCIS    Order Specific Question:   Preferred imaging location?    Answer:   [facility]       Zoila Shutter MD

## 2018-01-17 ENCOUNTER — Other Ambulatory Visit (HOSPITAL_COMMUNITY): Payer: Self-pay | Admitting: Adult Health

## 2018-01-25 ENCOUNTER — Other Ambulatory Visit: Payer: Self-pay | Admitting: *Deleted

## 2018-01-25 ENCOUNTER — Telehealth: Payer: Self-pay | Admitting: *Deleted

## 2018-01-25 ENCOUNTER — Telehealth: Payer: Self-pay | Admitting: Gastroenterology

## 2018-01-25 ENCOUNTER — Encounter: Payer: Self-pay | Admitting: *Deleted

## 2018-01-25 ENCOUNTER — Encounter: Payer: Self-pay | Admitting: Gastroenterology

## 2018-01-25 ENCOUNTER — Ambulatory Visit: Payer: Medicare Other | Admitting: Gastroenterology

## 2018-01-25 VITALS — BP 121/84 | HR 72 | Temp 97.4°F | Ht 66.0 in | Wt 209.2 lb

## 2018-01-25 DIAGNOSIS — R1011 Right upper quadrant pain: Secondary | ICD-10-CM | POA: Diagnosis not present

## 2018-01-25 DIAGNOSIS — R1319 Other dysphagia: Secondary | ICD-10-CM

## 2018-01-25 DIAGNOSIS — Z1211 Encounter for screening for malignant neoplasm of colon: Secondary | ICD-10-CM

## 2018-01-25 DIAGNOSIS — R1310 Dysphagia, unspecified: Secondary | ICD-10-CM | POA: Diagnosis not present

## 2018-01-25 MED ORDER — PEG 3350-KCL-NA BICARB-NACL 420 G PO SOLR: 4000.0000 mL | Freq: Once | ORAL | 0 refills | Status: AC

## 2018-01-25 NOTE — Telephone Encounter (Signed)
Pt notified and will given an update after she sees the Dr.

## 2018-01-25 NOTE — Patient Instructions (Addendum)
1. Colonoscopy as scheduled. See separate instructions.  2. We will be in touch regarding possible CT scan to evaluate your abdominal pain.  3. Let me know if how your thyroid scan turns out and if Dr. Ronnald Collum feels the thyroid is affecting your swallowing. You may need an upper endoscopy at a later date.

## 2018-01-25 NOTE — Telephone Encounter (Signed)
Pre-op scheduled for 03/14/18 at 11:00am. Letter mailed. Called home # line rings busy. Called mobile #, NA.

## 2018-01-25 NOTE — Progress Notes (Signed)
CC'D TO PCP °

## 2018-01-25 NOTE — Telephone Encounter (Signed)
Please let patient know that I was able to find a fairly current CT scan done at [facility]. I believe she was having right sided abd pain at that time as well.   Nothing seen on study to explain the ruq pain. Her liver showed fatty again, and stable liver cyst.   Let's proceed with TCS as planned. I would likely recommend EGD with possible esophageal dilation at later date based on findings.   I will wait to hear from her regarding her appt with Dr. Bary Leriche.

## 2018-01-25 NOTE — Progress Notes (Signed)
Primary Care Physician:  [name], MD  Primary Gastroenterologist:  Garfield Cornea, MD   Chief Complaint  Patient presents with  . Colonoscopy    ?last tcs 2008; stools thin    HPI:  Sara [name] is a 72 y.o. female here to schedule 10-year follow-up screening colonoscopy.  Due to polypharmacy, she was brought in for discussing sedation.  Patient tells me that she actually remembers being awake during her last procedure.  She will definitely require deep sedation this time.  Overall her stools are regular.  She has about 2 bowel movements a day.  She has noticed somewhat decreased caliber of stools.  Denies rectal bleeding, melena.  She complains of some right upper quadrant pain she has had for several months.  She states it is a little different than the pain she had back in 2017 when we saw her for right upper quadrant pain.  The symptoms are not related to meals.  Seem to be worse when she lays down at night to go to bed especially if she lays on that right side.  Also worse with prolonged sitting.  She generally can get improvement in her symptoms if she adjust her position and "prays".  No associated nausea or vomiting.  She denies any heartburn.  Denies any postprandial abdominal pain.  She has had some issues with swallowing solid foods for a couple of months.  She is not sure if is related to her thyroid.  She does have a history of cervical esophageal web and Schatzki ring requiring dilation in the past.  She has an appointment with Dr. Ronnald Collum tomorrow to discuss ultrasound and for thyroid scan.  Plan is to discuss her swallowing issues with him.  States she is doing well regarding her renal cell carcinoma which was diagnosed in 2017.  She has a history of breast cancer in 2016.  Last seen by oncology in March.  She is on tamoxifen.  Patient had an MRI liver with and without contrast in July 2017 to further evaluate liver lesion seen on CT scan.  She was found to have moderate  geographic hepatic steatosis.  9 mm T2 hyperintense lesion in the anterior right hepatic lobe without enhancement compatible with a benign cyst.  Gallbladder unremarkable.   Current Outpatient Medications  Medication Sig Dispense Refill  . acetaminophen (TYLENOL) 500 MG tablet Take 1,000 mg by mouth every 6 (six) hours as needed for moderate pain.     . diphenhydrAMINE (SOMINEX) 25 MG tablet Take 50 mg by mouth at bedtime as needed for allergies or sleep.    Marland Kitchen gabapentin (NEURONTIN) 600 MG tablet TAKE 1/2 TABLET BY MOUTH  DURING THE DAY AND 1 TABLET AT BEDTIME 135 tablet 5  . hydrochlorothiazide (HYDRODIURIL) 25 MG tablet Take 25 mg by mouth daily.     Marland Kitchen levothyroxine (SYNTHROID, LEVOTHROID) 88 MCG tablet Take 88 mcg by mouth daily before breakfast.     . LORazepam (ATIVAN) 1 MG tablet Take 1 mg by mouth at bedtime. For sleep    . metoprolol tartrate (LOPRESSOR) 25 MG tablet Take 25 mg by mouth 2 (two) times daily.     Marland Kitchen omeprazole (PRILOSEC) 40 MG capsule Take 40 mg by mouth daily before breakfast. Takes one tablet every other day    . oxybutynin (DITROPAN) 5 MG tablet Take 5 mg by mouth 2 (two) times daily. Reported on 01/30/2016    . pravastatin (PRAVACHOL) 40 MG tablet Take 40 mg by mouth every morning.     Marland Kitchen  sertraline (ZOLOFT) 50 MG tablet Take 1 tablet (50 mg total) by mouth daily. 90 tablet 3  . tamoxifen (NOLVADEX) 20 MG tablet TAKE 1 TABLET BY MOUTH  DAILY 90 tablet 1  . tamoxifen (NOLVADEX) 20 MG tablet TAKE 1 TABLET BY MOUTH  DAILY 90 tablet 0  . tamoxifen (NOLVADEX) 20 MG tablet TAKE 1 TABLET BY MOUTH  DAILY 90 tablet 2   No current facility-administered medications for this visit.     Allergies as of 01/25/2018 - Review Complete 01/25/2018  Allergen Reaction Noted  . Propoxyphene hcl Other (See Comments) 09/07/2006  . Pollen extract Other (See Comments) 12/12/2014  . Camphor Itching 01/03/2014  . Dust mite extract Itching 10/06/2013  . Latex Dermatitis and Rash 06/24/2011   . Molds & smuts Itching 10/06/2013  . Tomato Hives 05/13/2012    Past Medical History:  Diagnosis Date  . Allergic rhinitis due to pollen   . Anxiety   . Arthritis    "knees, back, right elbow; left shoulder; right ankle" (11/20/2014)  . Breast cancer, right breast ([place])    "DCIS; zero stage" S/P mastectomy 11/20/2014  . Cervical spondylosis without myelopathy    all over  . Cervicalgia   . Chronic lower back pain   . Constipation    takes Miralax daily  . Degeneration of cervical intervertebral disc   . Depression   . Diverticulosis of colon (without mention of hemorrhage)   . GERD (gastroesophageal reflux disease)    takes Omeprazole daily  . Graves' disease    "took a pill to correct"  . H/O hiatal hernia   . H/O urinary frequency   . Headache(784.0)    denies migraines since the 80's but has occ and takes Butalbital prn  . HTN (hypertension)    takes Metoprolol and Lisinopril daily  . Hypertension   . Insomnia    takes Ativan nigtly  . Joint pain    "all of them"  . Joint swelling    "knees, legs, ankles sometimes" (11/20/2014)  . Morbid obesity ([place])   . Numbness    LOWER LEGS  . Other postablative hypothyroidism    takes SYnthroid daily  . Palpitations   . Peptic ulcer, unspecified site, unspecified as acute or chronic, without mention of hemorrhage, perforation, or obstruction   . Primary localized osteoarthrosis, lower leg   . Pure hypercholesterolemia    takes Pravastin daily  . Recurrent UTI   . Renal cell carcinoma ([place]) 01/08/2016  . Renal mass    RIGHT  . Shortness of breath   . Sleep apnea    slight but doesn't require a CPAP (11/20/2014)  . Tendonitis of knee    bilateral    Past Surgical History:  Procedure Laterality Date  . ABDOMINAL HYSTERECTOMY    . ANTERIOR LAT LUMBAR FUSION  05/13/2012   Procedure: ANTERIOR LATERAL LUMBAR FUSION 2 LEVELS;  Surgeon: Faythe Ghee, MD;  Location: [facility];  Service: Neurosurgery;  Laterality: Left;   Left Lumbar Three-four,Lumbar four-five Extreme Lumbar Interbody Fusion with Percutaneous Pedicle Screws  . APPENDECTOMY    . BREAST BIOPSY Left ~ 2014  . BREAST BIOPSY Right 2015  . BREAST LUMPECTOMY Left 1988  . BREAST RECONSTRUCTION WITH PLACEMENT OF TISSUE EXPANDER AND FLEX HD (ACELLULAR HYDRATED DERMIS) Right 11/20/2014  . BREAST RECONSTRUCTION WITH PLACEMENT OF TISSUE EXPANDER AND FLEX HD (ACELLULAR HYDRATED DERMIS) Right 11/20/2014   Procedure: RIGHT BREAST RECONSTRUCTION WITH PLACEMENT OF TISSUE EXPANDER AND ACELLULAR DERMA MATRIX (ACELLULAR  DERMA MATRIX);  Surgeon: Crissie Reese, MD;  Location: [facility];  Service: Plastics;  Laterality: Right;  . CATARACT EXTRACTION W/ INTRAOCULAR LENS  IMPLANT, BILATERAL Bilateral   . COLONOSCOPY  Sept 2008   RMR: normal rectum, left-sided diverticula, repeat in 2018  . DIAGNOSTIC LAPAROSCOPY    . DILATION AND CURETTAGE OF UTERUS    . ESOPHAGOGASTRODUODENOSCOPY    . ESOPHAGOGASTRODUODENOSCOPY  2013   Dr. Gala Romney: Cervical esophageal web and Schatzki's ring s/p dilation, small hiatal hernia, antral and duodenal erosions likely NSAID effect, chronic duodenitis on path   . FOREIGN BODY REMOVAL Right 10/06/2013   Procedure: REMOVAL FOREIGN BODY EXTREMITY;  Surgeon: Carole Civil, MD;  Location: [facility];  Service: Orthopedics;  Laterality: Right;  . INJECTION KNEE Left 10/06/2013   Procedure: KNEE INJECTION;  Surgeon: Carole Civil, MD;  Location: [facility];  Service: Orthopedics;  Laterality: Left;  . JOINT REPLACEMENT    . KNEE ARTHROSCOPY Right   . KNEE ARTHROSCOPY WITH LATERAL MENISECTOMY Right 10/06/2013   Procedure: KNEE ARTHROSCOPY WITH LATERAL AND MEDIAL MENISECTOMY;  Surgeon: Carole Civil, MD;  Location: [facility];  Service: Orthopedics;  Laterality: Right;  END @ 1234  . lumpectomy on pelvis     "mass of nerves"  . MASTECTOMY COMPLETE / SIMPLE W/ SENTINEL NODE BIOPSY Right 11/20/2014   axillary  . REMOVAL OF TISSUE EXPANDER AND PLACEMENT OF  IMPLANT Right 12/13/2014   Procedure: REMOVAL OF RIGHT BREAST TISSUE EXPANDER;  Surgeon: Crissie Reese, MD;  Location: [facility];  Service: Plastics;  Laterality: Right;  . ROBOTIC ASSITED PARTIAL NEPHRECTOMY Right 01/08/2016   Procedure: XI ROBOTIC ASSITED PARTIAL NEPHRECTOMY;  Surgeon: Alexis Frock, MD;  Location: [facility];  Service: Urology;  Laterality: Right;  . SIMPLE MASTECTOMY WITH AXILLARY SENTINEL NODE BIOPSY Right 11/20/2014   Procedure: SIMPLE MASTECTOMY WITH AXILLARY SENTINEL NODE BIOPSY;  Surgeon: Erroll Luna, MD;  Location: [facility];  Service: General;  Laterality: Right;  . TISSUE EXPANDER REMOVAL Right 12/13/2014   dr Harlow Mares  . TONSILLECTOMY    . TOTAL KNEE ARTHROPLASTY Right 01/24/2014   Procedure: TOTAL KNEE ARTHROPLASTY;  Surgeon: Carole Civil, MD;  Location: [facility];  Service: Orthopedics;  Laterality: Right;  . TOTAL KNEE ARTHROPLASTY Left 08/08/2014   Procedure: TOTAL KNEE ARTHROPLASTY;  Surgeon: Carole Civil, MD;  Location: [facility];  Service: Orthopedics;  Laterality: Left;  . TUBAL LIGATION    . UPPER GASTROINTESTINAL ENDOSCOPY      Family History  Problem Relation Age of Onset  . Stroke Sister   . Osteoarthritis Sister   . Osteoarthritis Brother   . Obesity Brother   . Heart attack Sister   . Heart attack Father   . Lung cancer Maternal Grandmother        non-smoker  . Heart attack Unknown        2 Brothers, 3 sisters/Father died in 48's from old age  . Colon cancer Neg Hx        Mother died at 82 from blood clot, MI    Social History   Socioeconomic History  . Marital status: Married    Spouse name: Milbert Coulter  . Number of children: 4  . Years of education: Not on file  . Highest education level: Not on file  Occupational History  . Occupation: disability    Employer: UNEMPLOYED    Comment: since 2008, knee surgery  Social Needs  . Financial resource strain: Not on file  . Food insecurity:  Worry: Not on file    Inability: Not on file  .  Transportation needs:    Medical: Not on file    Non-medical: Not on file  Tobacco Use  . Smoking status: Former Smoker    Packs/day: 1.00    Years: 27.00    Pack years: 27.00    Types: Cigarettes    Last attempt to quit: 12/26/1985    Years since quitting: 32.1  . Smokeless tobacco: Never Used  . Tobacco comment: Quit in 1887  Substance and Sexual Activity  . Alcohol use: Yes    Alcohol/week: 0.0 oz    Comment: occasional wine  . Drug use: No  . Sexual activity: Yes    Birth control/protection: Surgical  Lifestyle  . Physical activity:    Days per week: Not on file    Minutes per session: Not on file  . Stress: Not on file  Relationships  . Social connections:    Talks on phone: Not on file    Gets together: Not on file    Attends religious service: Not on file    Active member of club or organization: Not on file    Attends meetings of clubs or organizations: Not on file    Relationship status: Not on file  . Intimate partner violence:    Fear of current or ex partner: Not on file    Emotionally abused: Not on file    Physically abused: Not on file    Forced sexual activity: Not on file  Other Topics Concern  . Not on file  Social History Narrative   Currently married for 10 years. Husband's name is Milbert Coulter. She is an excellent singer. 8 children between she and her husband. 23 grand children. 12 great grandchildren.      ROS:  General: Negative for anorexia, weight loss, fever, chills, fatigue, weakness. Eyes: Negative for vision changes.  ENT: Negative for hoarseness,  nasal congestion. See hpi CV: Negative for chest pain, angina, palpitations, dyspnea on exertion, peripheral edema.  Respiratory: Negative for dyspnea at rest, dyspnea on exertion, cough, sputum, wheezing.  GI: See history of present illness. GU:  Negative for dysuria, hematuria, urinary incontinence, urinary frequency, nocturnal urination.  MS: Negative for joint pain, low back pain.  Derm:  Negative for rash or itching.  Neuro: Negative for weakness, abnormal sensation, seizure, frequent headaches, memory loss, confusion.  Psych: Negative for anxiety, depression, suicidal ideation, hallucinations.  Endo: Negative for unusual weight change.  Heme: Negative for bruising or bleeding. Allergy: Negative for rash or hives.    Physical Examination:  BP 121/84   Pulse 72   Temp (!) 97.4 F (36.3 C) (Oral)   Ht 5\' 6"  (1.676 m)   Wt 209 lb 3.2 oz (94.9 kg)   BMI 33.77 kg/m    General: Well-nourished, well-developed in no acute distress. Accompanied by spouse Head: Normocephalic, atraumatic.   Eyes: Conjunctiva pink, no icterus. Mouth: Oropharyngeal mucosa moist and pink , no lesions erythema or exudate. Neck: Supple without thyromegaly, masses, or lymphadenopathy.  Lungs: Clear to auscultation bilaterally.  Heart: Regular rate and rhythm, no murmurs rubs or gallops.  Abdomen: Bowel sounds are normal, nondistended, no hepatosplenomegaly or masses, no abdominal bruits or    hernia , no rebound or guarding.  Tender in ruq with deep palpation. No tenderness with palpation of the right anterior ribs.  Rectal: not performed Extremities: No lower extremity edema. No clubbing or deformities.  Neuro: Alert and oriented x 4 ,  grossly normal neurologically.  Skin: Warm and dry, no rash or jaundice.   Psych: Alert and cooperative, normal mood and affect.  Labs: Lab Results  Component Value Date   CREATININE 0.85 11/23/2017   BUN 8 11/23/2017   NA 136 11/23/2017   K 3.4 (L) 11/23/2017   CL 99 (L) 11/23/2017   CO2 25 11/23/2017   Lab Results  Component Value Date   ALT 31 11/23/2017   AST 45 (H) 11/23/2017   ALKPHOS 47 11/23/2017   BILITOT 0.5 11/23/2017   Lab Results  Component Value Date   WBC 6.6 11/23/2017   HGB 13.0 11/23/2017   HCT 39.8 11/23/2017   MCV 95.2 11/23/2017   PLT 215 11/23/2017     Imaging Studies: No results found.

## 2018-01-25 NOTE — Assessment & Plan Note (Signed)
Somewhat different than her RUQ pain in the past. Recent onset few months ago. Unrelated to meals. Cannot rule out musculoskeletal etiology or related to adhesions. Apparently she was having the pain at time of CT done in 09/2017 according to reasons for study that were listed.  CT done in 09/2017 without explanation for her abdominal pain.   Would recommend proceeding with colonoscopy as planned. She may require EGD as next step if colonoscopy is unrevealing.

## 2018-01-25 NOTE — Assessment & Plan Note (Signed)
Multiple issues that could be contributing to her swallowing problems.  Currently being worked up for thyroid issue, has a thyroid scan planned per patient.  She sees Dr. Francoise Schaumann tomorrow.  She may need upper endoscopy in the near future with possible dilation.  She does have history of cervical esophageal web as well as Schatzki ring in the past.  Previously responded to esophageal dilation.  Further recommendations to follow.

## 2018-01-25 NOTE — Assessment & Plan Note (Signed)
Due for 10 year screening colonoscopy. Due to failed conscious sedation in the past and polypharmacy, we will plan on deep sedation with use of anesthesiologist.  I have discussed the risks, alternatives, benefits with regards to but not limited to the risk of reaction to medication, bleeding, infection, perforation and the patient is agreeable to proceed. Written consent to be obtained.

## 2018-01-26 DIAGNOSIS — E039 Hypothyroidism, unspecified: Secondary | ICD-10-CM | POA: Diagnosis not present

## 2018-01-26 DIAGNOSIS — R1310 Dysphagia, unspecified: Secondary | ICD-10-CM | POA: Diagnosis not present

## 2018-01-26 DIAGNOSIS — I10 Essential (primary) hypertension: Secondary | ICD-10-CM | POA: Diagnosis not present

## 2018-02-08 NOTE — Telephone Encounter (Signed)
Noted  

## 2018-02-21 ENCOUNTER — Telehealth: Payer: Self-pay | Admitting: Internal Medicine

## 2018-02-21 DIAGNOSIS — K589 Irritable bowel syndrome without diarrhea: Secondary | ICD-10-CM | POA: Diagnosis not present

## 2018-02-21 DIAGNOSIS — N3281 Overactive bladder: Secondary | ICD-10-CM | POA: Diagnosis not present

## 2018-02-21 DIAGNOSIS — I10 Essential (primary) hypertension: Secondary | ICD-10-CM | POA: Diagnosis not present

## 2018-02-21 NOTE — Telephone Encounter (Signed)
Spoke with pt and she wants [facility] to be aware that her thyroid was ok and the doctor is sending notes.

## 2018-02-21 NOTE — Telephone Encounter (Signed)
Pt wanted to let L[facility] know that her doctor said that nothing was wrong with her thyroid and was going to fax over medical records for [facility] to review because there was questions about doing an EGD along with her colonoscopy. I asked her to have her doctor fax the papers over and I would get them to [facility]. SHe would like to speak with the nurse as well. [phone]

## 2018-03-09 ENCOUNTER — Other Ambulatory Visit (HOSPITAL_COMMUNITY): Payer: Self-pay | Admitting: Family Medicine

## 2018-03-09 ENCOUNTER — Ambulatory Visit (HOSPITAL_COMMUNITY)
Admission: RE | Admit: 2018-03-09 | Discharge: 2018-03-09 | Disposition: A | Discharge status: Home / Self Care | Payer: Medicare Other | Source: Ambulatory Visit | Attending: Family Medicine | Admitting: Family Medicine

## 2018-03-09 DIAGNOSIS — R059 Cough, unspecified: Secondary | ICD-10-CM

## 2018-03-09 DIAGNOSIS — Z9011 Acquired absence of right breast and nipple: Secondary | ICD-10-CM | POA: Diagnosis not present

## 2018-03-09 DIAGNOSIS — J219 Acute bronchiolitis, unspecified: Secondary | ICD-10-CM | POA: Diagnosis not present

## 2018-03-09 DIAGNOSIS — R05 Cough: Secondary | ICD-10-CM | POA: Diagnosis not present

## 2018-03-09 DIAGNOSIS — R079 Chest pain, unspecified: Secondary | ICD-10-CM | POA: Diagnosis not present

## 2018-03-09 DIAGNOSIS — R0602 Shortness of breath: Secondary | ICD-10-CM | POA: Diagnosis not present

## 2018-03-09 DIAGNOSIS — I10 Essential (primary) hypertension: Secondary | ICD-10-CM | POA: Diagnosis not present

## 2018-03-10 NOTE — Telephone Encounter (Signed)
Would consider adding egd/ed for dysphagia. Can we add to her colonoscopy already scheduled?

## 2018-03-10 NOTE — Patient Instructions (Signed)
Sara [name]  03/10/2018     @PREFPERIOPPHARMACY @   Your procedure is scheduled on  03/21/2018   Report to [facility] at  640   A.M.  Call this number if you have problems the morning of surgery:  [phone]   Remember:  Do not eat or drink after midnight.  You may drink clear liquids until (follow the instructions given to you) .  Clear liquids allowed are:                    Water, Juice (non-citric and without pulp), Carbonated beverages, Clear Tea, Black Coffee only, Plain Jell-O only, Gatorade and Plain Popsicles only    Take these medicines the morning of surgery with A SIP OF WATER  Neurontin, levothyroxine, prilosec, ditropan, zoloft.    Do not wear jewelry, make-up or nail polish.  Do not wear lotions, powders, or perfumes, or deodorant.  Do not shave 48 hours prior to surgery.  Men may shave face and neck.  Do not bring valuables to the hospital.  [facility] is not responsible for any belongings or valuables.  Contacts, dentures or bridgework may not be worn into surgery.  Leave your suitcase in the car.  After surgery it may be brought to your room.  For patients admitted to the hospital, discharge time will be determined by your treatment team.  Patients discharged the day of surgery will not be allowed to drive home.   Name and phone number of your driver:   family Special instructions:  Follow the diet and prep instructions given to you by Dr Roseanne Kaufman office.  Please read over the following fact sheets that you were given. Anesthesia Post-op Instructions and Care and Recovery After Surgery       Colonoscopy, Adult A colonoscopy is an exam to look at the large intestine. It is done to check for problems, such as:  Lumps (tumors).  Growths (polyps).  Swelling (inflammation).  Bleeding.  What happens before the procedure? Eating and drinking Follow instructions from your doctor about eating and drinking. These instructions may  include:  A few days before the procedure - follow a low-fiber diet. ? Avoid nuts. ? Avoid seeds. ? Avoid dried fruit. ? Avoid raw fruits. ? Avoid vegetables.  1-3 days before the procedure - follow a clear liquid diet. Avoid liquids that have red or purple dye. Drink only clear liquids, such as: ? Clear broth or bouillon. ? Black coffee or tea. ? Clear juice. ? Clear soft drinks or sports drinks. ? Gelatin dessert. ? Popsicles.  On the day of the procedure - do not eat or drink anything during the 2 hours before the procedure.  Bowel prep If you were prescribed an oral bowel prep:  Take it as told by your doctor. Starting the day before your procedure, you will need to drink a lot of liquid. The liquid will cause you to poop (have bowel movements) until your poop is almost clear or light green.  If your skin or butt gets irritated from diarrhea, you may: ? Wipe the area with wipes that have medicine in them, such as adult wet wipes with aloe and vitamin E. ? Put something on your skin that soothes the area, such as petroleum jelly.  If you throw up (vomit) while drinking the bowel prep, take a break for up to 60 minutes. Then begin the bowel prep again. If you keep  throwing up and you cannot take the bowel prep without throwing up, call your doctor.  General instructions  Ask your doctor about changing or stopping your normal medicines. This is important if you take diabetes medicines or blood thinners.  Plan to have someone take you home from the hospital or clinic. What happens during the procedure?  An IV tube may be put into one of your veins.  You will be given medicine to help you relax (sedative).  To reduce your risk of infection: ? Your doctors will wash their hands. ? Your anal area will be washed with soap.  You will be asked to lie on your side with your knees bent.  Your doctor will get a long, thin, flexible tube ready. The tube will have a camera and a  light on the end.  The tube will be put into your anus.  The tube will be gently put into your large intestine.  Air will be delivered into your large intestine to keep it open. You may feel some pressure or cramping.  The camera will be used to take photos.  A small tissue sample may be removed from your body to be looked at under a microscope (biopsy). If any possible problems are found, the tissue will be sent to a lab for testing.  If small growths are found, your doctor may remove them and have them checked for cancer.  The tube that was put into your anus will be slowly removed. The procedure may vary among doctors and hospitals. What happens after the procedure?  Your doctor will check on you often until the medicines you were given have worn off.  Do not drive for 24 hours after the procedure.  You may have a small amount of blood in your poop.  You may pass gas.  You may have mild cramps or bloating in your belly (abdomen).  It is up to you to get the results of your procedure. Ask your doctor, or the department performing the procedure, when your results will be ready. This information is not intended to replace advice given to you by your health care provider. Make sure you discuss any questions you have with your health care provider. Document Released: 10/10/2010 Document Revised: 07/08/2016 Document Reviewed: 11/19/2015 Elsevier Interactive Patient Education  2017 [employer]  Colonoscopy, Adult, Care After This sheet gives you information about how to care for yourself after your procedure. Your health care provider may also give you more specific instructions. If you have problems or questions, contact your health care provider. What can I expect after the procedure? After the procedure, it is common to have:  A small amount of blood in your stool for 24 hours after the procedure.  Some gas.  Mild abdominal cramping or bloating.  Follow these  instructions at home: General instructions   For the first 24 hours after the procedure: ? Do not drive or use machinery. ? Do not sign important documents. ? Do not drink alcohol. ? Do your regular daily activities at a slower pace than normal. ? Eat soft, easy-to-digest foods. ? Rest often.  Take over-the-counter or prescription medicines only as told by your health care provider.  It is up to you to get the results of your procedure. Ask your health care provider, or the department performing the procedure, when your results will be ready. Relieving cramping and bloating  Try walking around when you have cramps or feel bloated.  Apply heat to your  abdomen as told by your health care provider. Use a heat source that your health care provider recommends, such as a moist heat pack or a heating pad. ? Place a towel between your skin and the heat source. ? Leave the heat on for 20-30 minutes. ? Remove the heat if your skin turns bright red. This is especially important if you are unable to feel pain, heat, or cold. You may have a greater risk of getting burned. Eating and drinking  Drink enough fluid to keep your urine clear or pale yellow.  Resume your normal diet as instructed by your health care provider. Avoid heavy or fried foods that are hard to digest.  Avoid drinking alcohol for as long as instructed by your health care provider. Contact a health care provider if:  You have blood in your stool 2-3 days after the procedure. Get help right away if:  You have more than a small spotting of blood in your stool.  You pass large blood clots in your stool.  Your abdomen is swollen.  You have nausea or vomiting.  You have a fever.  You have increasing abdominal pain that is not relieved with medicine. This information is not intended to replace advice given to you by your health care provider. Make sure you discuss any questions you have with your health care  provider. Document Released: 04/21/2004 Document Revised: 06/01/2016 Document Reviewed: 11/19/2015 Elsevier Interactive Patient Education  2018 [place] Anesthesia is a term that refers to techniques, procedures, and medicines that help a person stay safe and comfortable during a medical procedure. Monitored anesthesia care, or sedation, is one type of anesthesia. Your anesthesia specialist may recommend sedation if you will be having a procedure that does not require you to be unconscious, such as:  Cataract surgery.  A dental procedure.  A biopsy.  A colonoscopy.  During the procedure, you may receive a medicine to help you relax (sedative). There are three levels of sedation:  Mild sedation. At this level, you may feel awake and relaxed. You will be able to follow directions.  Moderate sedation. At this level, you will be sleepy. You may not remember the procedure.  Deep sedation. At this level, you will be asleep. You will not remember the procedure.  The more medicine you are given, the deeper your level of sedation will be. Depending on how you respond to the procedure, the anesthesia specialist may change your level of sedation or the type of anesthesia to fit your needs. An anesthesia specialist will monitor you closely during the procedure. Let your health care provider know about:  Any allergies you have.  All medicines you are taking, including vitamins, herbs, eye drops, creams, and over-the-counter medicines.  Any use of steroids (by mouth or as a cream).  Any problems you or family members have had with sedatives and anesthetic medicines.  Any blood disorders you have.  Any surgeries you have had.  Any medical conditions you have, such as sleep apnea.  Whether you are pregnant or may be pregnant.  Any use of cigarettes, alcohol, or street drugs. What are the risks? Generally, this is a safe procedure. However, problems may  occur, including:  Getting too much medicine (oversedation).  Nausea.  Allergic reaction to medicines.  Trouble breathing. If this happens, a breathing tube may be used to help with breathing. It will be removed when you are awake and breathing on your own.  Heart trouble.  Lung trouble.  Before the procedure Staying hydrated Follow instructions from your health care provider about hydration, which may include:  Up to 2 hours before the procedure - you may continue to drink clear liquids, such as water, clear fruit juice, black coffee, and plain tea.  Eating and drinking restrictions Follow instructions from your health care provider about eating and drinking, which may include:  8 hours before the procedure - stop eating heavy meals or foods such as meat, fried foods, or fatty foods.  6 hours before the procedure - stop eating light meals or foods, such as toast or cereal.  6 hours before the procedure - stop drinking milk or drinks that contain milk.  2 hours before the procedure - stop drinking clear liquids.  Medicines Ask your health care provider about:  Changing or stopping your regular medicines. This is especially important if you are taking diabetes medicines or blood thinners.  Taking medicines such as aspirin and ibuprofen. These medicines can thin your blood. Do not take these medicines before your procedure if your health care provider instructs you not to.  Tests and exams  You will have a physical exam.  You may have blood tests done to show: ? How well your kidneys and liver are working. ? How well your blood can clot.  General instructions  Plan to have someone take you home from the hospital or clinic.  If you will be going home right after the procedure, plan to have someone with you for 24 hours.  What happens during the procedure?  Your blood pressure, heart rate, breathing, level of pain and overall condition will be monitored.  An IV  tube will be inserted into one of your veins.  Your anesthesia specialist will give you medicines as needed to keep you comfortable during the procedure. This may mean changing the level of sedation.  The procedure will be performed. After the procedure  Your blood pressure, heart rate, breathing rate, and blood oxygen level will be monitored until the medicines you were given have worn off.  Do not drive for 24 hours if you received a sedative.  You may: ? Feel sleepy, clumsy, or nauseous. ? Feel forgetful about what happened after the procedure. ? Have a sore throat if you had a breathing tube during the procedure. ? Vomit. This information is not intended to replace advice given to you by your health care provider. Make sure you discuss any questions you have with your health care provider. Document Released: 06/03/2005 Document Revised: 02/14/2016 Document Reviewed: 12/29/2015 Elsevier Interactive Patient Education  2018 [place], Care After These instructions provide you with information about caring for yourself after your procedure. Your health care provider may also give you more specific instructions. Your treatment has been planned according to current medical practices, but problems sometimes occur. Call your health care provider if you have any problems or questions after your procedure. What can I expect after the procedure? After your procedure, it is common to:  Feel sleepy for several hours.  Feel clumsy and have poor balance for several hours.  Feel forgetful about what happened after the procedure.  Have poor judgment for several hours.  Feel nauseous or vomit.  Have a sore throat if you had a breathing tube during the procedure.  Follow these instructions at home: For at least 24 hours after the procedure:   Do not: ? Participate in activities in which you could fall or become injured. ?  Drive. ? Use heavy  machinery. ? Drink alcohol. ? Take sleeping pills or medicines that cause drowsiness. ? Make important decisions or sign legal documents. ? Take care of children on your own.  Rest. Eating and drinking  Follow the diet that is recommended by your health care provider.  If you vomit, drink water, juice, or soup when you can drink without vomiting.  Make sure you have little or no nausea before eating solid foods. General instructions  Have a responsible adult stay with you until you are awake and alert.  Take over-the-counter and prescription medicines only as told by your health care provider.  If you smoke, do not smoke without supervision.  Keep all follow-up visits as told by your health care provider. This is important. Contact a health care provider if:  You keep feeling nauseous or you keep vomiting.  You feel light-headed.  You develop a rash.  You have a fever. Get help right away if:  You have trouble breathing. This information is not intended to replace advice given to you by your health care provider. Make sure you discuss any questions you have with your health care provider. Document Released: 12/29/2015 Document Revised: 04/29/2016 Document Reviewed: 12/29/2015 Elsevier Interactive Patient Education  [employer].

## 2018-03-11 NOTE — Telephone Encounter (Signed)
Routing message to [facility] clinical. Please see LSL scheduling question.

## 2018-03-11 NOTE — Telephone Encounter (Signed)
Awaiting LSL to speak with RMR

## 2018-03-11 NOTE — Telephone Encounter (Signed)
Please add EGD/ED to colonoscopy. Dx: dysphagia.

## 2018-03-11 NOTE — Telephone Encounter (Signed)
Yes there is enough room on the schedule to be added. Sending LSL a staff message regarding scheduling

## 2018-03-14 ENCOUNTER — Other Ambulatory Visit: Payer: Self-pay | Admitting: *Deleted

## 2018-03-14 ENCOUNTER — Encounter (HOSPITAL_COMMUNITY): Payer: Self-pay

## 2018-03-14 ENCOUNTER — Other Ambulatory Visit: Payer: Self-pay

## 2018-03-14 ENCOUNTER — Encounter (HOSPITAL_COMMUNITY)
Admission: RE | Admit: 2018-03-14 | Discharge: 2018-03-14 | Disposition: A | Discharge status: Home / Self Care | Payer: Medicare Other | Source: Ambulatory Visit | Attending: Internal Medicine | Admitting: Internal Medicine

## 2018-03-14 DIAGNOSIS — Z01810 Encounter for preprocedural cardiovascular examination: Secondary | ICD-10-CM | POA: Diagnosis not present

## 2018-03-14 DIAGNOSIS — Z01812 Encounter for preprocedural laboratory examination: Secondary | ICD-10-CM | POA: Insufficient documentation

## 2018-03-14 LAB — CBC
HCT: 38.9 % (ref 36.0–46.0)
Hemoglobin: 13.1 g/dL (ref 12.0–15.0)
MCH: 32 pg (ref 26.0–34.0)
MCHC: 33.7 g/dL (ref 30.0–36.0)
MCV: 95.1 fL (ref 78.0–100.0)
Platelets: 272 10*3/uL (ref 150–400)
RBC: 4.09 MIL/uL (ref 3.87–5.11)
RDW: 13.1 % (ref 11.5–15.5)
WBC: 6.6 10*3/uL (ref 4.0–10.5)

## 2018-03-14 LAB — BASIC METABOLIC PANEL
Anion gap: 8 (ref 5–15)
BUN: 8 mg/dL (ref 6–20)
CO2: 28 mmol/L (ref 22–32)
Calcium: 8.7 mg/dL — ABNORMAL LOW (ref 8.9–10.3)
Chloride: 99 mmol/L — ABNORMAL LOW (ref 101–111)
Creatinine, Ser: 0.72 mg/dL (ref 0.44–1.00)
GFR calc Af Amer: >60 mL/min (ref >60)
GFR calc non Af Amer: >60 mL/min (ref >60)
Glucose, Bld: 107 mg/dL — ABNORMAL HIGH (ref 65–99)
Potassium: 3.3 mmol/L — ABNORMAL LOW (ref 3.5–5.1)
Sodium: 135 mmol/L (ref 135–145)

## 2018-03-14 NOTE — Pre-Procedure Instructions (Signed)
Per patient, "I was supposed to have an upper endoscopy too because I am having trouble swallowing again". Reviewed Neil Crouch note that states to add EGD/ED to her colonoscopy. Kirklin Mcduffee faint, OR scheduler called and added this to booking and consent was changed in computer as well.

## 2018-03-15 NOTE — Telephone Encounter (Signed)
Per LSL, RMR okay'd to add EGD to procedure.

## 2018-03-15 NOTE — Telephone Encounter (Signed)
Spoke with patient and she reports Endo had her sign the consent when she went for her pre-op appointment.

## 2018-03-21 ENCOUNTER — Ambulatory Visit (HOSPITAL_COMMUNITY)
Admission: RE | Admit: 2018-03-21 | Discharge: 2018-03-21 | Disposition: A | Discharge status: Home / Self Care | Payer: Medicare Other | Source: Ambulatory Visit | Attending: Internal Medicine | Admitting: Internal Medicine

## 2018-03-21 ENCOUNTER — Encounter (HOSPITAL_COMMUNITY): Payer: Self-pay | Admitting: *Deleted

## 2018-03-21 ENCOUNTER — Encounter (HOSPITAL_COMMUNITY)
Admission: RE | Disposition: A | Discharge status: Home / Self Care | Payer: Self-pay | Source: Ambulatory Visit | Attending: Internal Medicine

## 2018-03-21 ENCOUNTER — Ambulatory Visit (HOSPITAL_COMMUNITY): Payer: Medicare Other | Admitting: Anesthesiology

## 2018-03-21 DIAGNOSIS — E7800 Pure hypercholesterolemia, unspecified: Secondary | ICD-10-CM | POA: Diagnosis not present

## 2018-03-21 DIAGNOSIS — F329 Major depressive disorder, single episode, unspecified: Secondary | ICD-10-CM | POA: Insufficient documentation

## 2018-03-21 DIAGNOSIS — Z1212 Encounter for screening for malignant neoplasm of rectum: Secondary | ICD-10-CM | POA: Diagnosis not present

## 2018-03-21 DIAGNOSIS — K640 First degree hemorrhoids: Secondary | ICD-10-CM | POA: Insufficient documentation

## 2018-03-21 DIAGNOSIS — I10 Essential (primary) hypertension: Secondary | ICD-10-CM | POA: Insufficient documentation

## 2018-03-21 DIAGNOSIS — F419 Anxiety disorder, unspecified: Secondary | ICD-10-CM | POA: Insufficient documentation

## 2018-03-21 DIAGNOSIS — K222 Esophageal obstruction: Secondary | ICD-10-CM | POA: Diagnosis not present

## 2018-03-21 DIAGNOSIS — Z853 Personal history of malignant neoplasm of breast: Secondary | ICD-10-CM | POA: Insufficient documentation

## 2018-03-21 DIAGNOSIS — Z85528 Personal history of other malignant neoplasm of kidney: Secondary | ICD-10-CM | POA: Diagnosis not present

## 2018-03-21 DIAGNOSIS — R1310 Dysphagia, unspecified: Secondary | ICD-10-CM

## 2018-03-21 DIAGNOSIS — E0500 Thyrotoxicosis with diffuse goiter without thyrotoxic crisis or storm: Secondary | ICD-10-CM | POA: Insufficient documentation

## 2018-03-21 DIAGNOSIS — Z1211 Encounter for screening for malignant neoplasm of colon: Secondary | ICD-10-CM | POA: Diagnosis not present

## 2018-03-21 DIAGNOSIS — Z87891 Personal history of nicotine dependence: Secondary | ICD-10-CM | POA: Insufficient documentation

## 2018-03-21 DIAGNOSIS — Z87440 Personal history of urinary (tract) infections: Secondary | ICD-10-CM | POA: Diagnosis not present

## 2018-03-21 DIAGNOSIS — G4730 Sleep apnea, unspecified: Secondary | ICD-10-CM | POA: Diagnosis not present

## 2018-03-21 DIAGNOSIS — K219 Gastro-esophageal reflux disease without esophagitis: Secondary | ICD-10-CM | POA: Insufficient documentation

## 2018-03-21 DIAGNOSIS — Z79899 Other long term (current) drug therapy: Secondary | ICD-10-CM | POA: Insufficient documentation

## 2018-03-21 DIAGNOSIS — K5730 Diverticulosis of large intestine without perforation or abscess without bleeding: Secondary | ICD-10-CM | POA: Insufficient documentation

## 2018-03-21 HISTORY — PX: ESOPHAGOGASTRODUODENOSCOPY (EGD) WITH PROPOFOL: SHX5813

## 2018-03-21 HISTORY — PX: COLONOSCOPY WITH PROPOFOL: SHX5780

## 2018-03-21 HISTORY — PX: MALONEY DILATION: SHX5535

## 2018-03-21 SURGERY — COLONOSCOPY WITH PROPOFOL
Anesthesia: Monitor Anesthesia Care

## 2018-03-21 MED ORDER — MIDAZOLAM HCL 5 MG/5ML IJ SOLN
INTRAMUSCULAR | Status: DC | PRN
  Administered 2018-03-21: 2 mg via INTRAVENOUS

## 2018-03-21 MED ORDER — CHLORHEXIDINE GLUCONATE CLOTH 2 % EX PADS: 6.0000 | MEDICATED_PAD | Freq: Once | CUTANEOUS | Status: DC

## 2018-03-21 MED ORDER — HYDROMORPHONE HCL 1 MG/ML IJ SOLN: 0.2500 mg | INTRAMUSCULAR | Status: DC | PRN

## 2018-03-21 MED ORDER — PROMETHAZINE HCL 25 MG/ML IJ SOLN: 6.2500 mg | INTRAMUSCULAR | Status: DC | PRN

## 2018-03-21 MED ORDER — PROPOFOL 10 MG/ML IV BOLUS
INTRAVENOUS | Status: AC
  Filled 2018-03-21: qty 60

## 2018-03-21 MED ORDER — LIDOCAINE HCL (CARDIAC) PF 50 MG/5ML IV SOSY
PREFILLED_SYRINGE | INTRAVENOUS | Status: DC | PRN
  Administered 2018-03-21: 30 mg via INTRAVENOUS

## 2018-03-21 MED ORDER — MIDAZOLAM HCL 2 MG/2ML IJ SOLN
INTRAMUSCULAR | Status: AC
  Filled 2018-03-21: qty 2

## 2018-03-21 MED ORDER — LACTATED RINGERS IV SOLN
INTRAVENOUS | Status: DC
  Administered 2018-03-21: 08:00:00 via INTRAVENOUS

## 2018-03-21 MED ORDER — PROPOFOL 500 MG/50ML IV EMUL
INTRAVENOUS | Status: DC | PRN
  Administered 2018-03-21: 09:00:00 via INTRAVENOUS
  Administered 2018-03-21: 150 ug/kg/min via INTRAVENOUS
  Administered 2018-03-21: 09:00:00 via INTRAVENOUS

## 2018-03-21 MED ORDER — LACTATED RINGERS IV SOLN: INTRAVENOUS | Status: DC

## 2018-03-21 MED ORDER — HYDROCODONE-ACETAMINOPHEN 7.5-325 MG PO TABS
1.0000 | ORAL_TABLET | Freq: Once | ORAL | Status: DC | PRN
Start: 2018-03-21 — End: 2018-03-21

## 2018-03-21 MED ORDER — MEPERIDINE HCL 100 MG/ML IJ SOLN: 6.2500 mg | INTRAMUSCULAR | Status: DC | PRN

## 2018-03-21 MED ORDER — LIDOCAINE VISCOUS HCL 2 % MT SOLN
OROMUCOSAL | Status: AC
  Filled 2018-03-21: qty 15

## 2018-03-21 MED ORDER — LIDOCAINE VISCOUS HCL 2 % MT SOLN
OROMUCOSAL | Status: DC | PRN
  Administered 2018-03-21: 1 via OROMUCOSAL

## 2018-03-21 NOTE — Anesthesia Postprocedure Evaluation (Signed)
Anesthesia Post Note  Patient: Sara [name]  Procedure(s) Performed: COLONOSCOPY WITH PROPOFOL (N/A ) ESOPHAGOGASTRODUODENOSCOPY (EGD) WITH PROPOFOL (N/A ) MALONEY DILATION (N/A )  Patient location during evaluation: PACU Anesthesia Type: MAC Level of consciousness: awake and alert and oriented Pain management: pain level controlled Vital Signs Assessment: post-procedure vital signs reviewed and stable Respiratory status: spontaneous breathing Cardiovascular status: stable Postop Assessment: no apparent nausea or vomiting Anesthetic complications: no     Last Vitals:  Vitals:   03/21/18 0742  BP: 131/73  Pulse: 81  Resp: 18  Temp: 36.6 C  SpO2: 95%    Last Pain:  Vitals:   03/21/18 0834  TempSrc:   PainSc: 4                  ADAMS, AMY A

## 2018-03-21 NOTE — Discharge Instructions (Signed)
EGD Discharge instructions Please read the instructions outlined below and refer to this sheet in the next few weeks. These discharge instructions provide you with general information on caring for yourself after you leave the hospital. Your doctor may also give you specific instructions. While your treatment has been planned according to the most current medical practices available, unavoidable complications occasionally occur. If you have any problems or questions after discharge, please call your doctor. ACTIVITY  You may resume your regular activity but move at a slower pace for the next 24 hours.   Take frequent rest periods for the next 24 hours.   Walking will help expel (get rid of) the air and reduce the bloated feeling in your abdomen.   No driving for 24 hours (because of the anesthesia (medicine) used during the test).   You may shower.   Do not sign any important legal documents or operate any machinery for 24 hours (because of the anesthesia used during the test).  NUTRITION  Drink plenty of fluids.   You may resume your normal diet.   Begin with a light meal and progress to your normal diet.   Avoid alcoholic beverages for 24 hours or as instructed by your caregiver.  MEDICATIONS  You may resume your normal medications unless your caregiver tells you otherwise.  WHAT YOU CAN EXPECT TODAY  You may experience abdominal discomfort such as a feeling of fullness or gas pains.  FOLLOW-UP  Your doctor will discuss the results of your test with you.  SEEK IMMEDIATE MEDICAL ATTENTION IF ANY OF THE FOLLOWING OCCUR:  Excessive nausea (feeling sick to your stomach) and/or vomiting.   Severe abdominal pain and distention (swelling).   Trouble swallowing.   Temperature over 101 F (37.8 C).   Rectal bleeding or vomiting of blood.   Colonoscopy Discharge Instructions  Read the instructions outlined below and refer to this sheet in the next few weeks. These  discharge instructions provide you with general information on caring for yourself after you leave the hospital. Your doctor may also give you specific instructions. While your treatment has been planned according to the most current medical practices available, unavoidable complications occasionally occur. If you have any problems or questions after discharge, call Dr. Gala Romney at [phone]. ACTIVITY  You may resume your regular activity, but move at a slower pace for the next 24 hours.   Take frequent rest periods for the next 24 hours.   Walking will help get rid of the air and reduce the bloated feeling in your belly (abdomen).   No driving for 24 hours (because of the medicine (anesthesia) used during the test).    Do not sign any important legal documents or operate any machinery for 24 hours (because of the anesthesia used during the test).  NUTRITION  Drink plenty of fluids.   You may resume your normal diet as instructed by your doctor.   Begin with a light meal and progress to your normal diet. Heavy or fried foods are harder to digest and may make you feel sick to your stomach (nauseated).   Avoid alcoholic beverages for 24 hours or as instructed.  MEDICATIONS  You may resume your normal medications unless your doctor tells you otherwise.  WHAT YOU CAN EXPECT TODAY  Some feelings of bloating in the abdomen.   Passage of more gas than usual.   Spotting of blood in your stool or on the toilet paper.  IF YOU HAD POLYPS REMOVED DURING THE COLONOSCOPY:  No aspirin products for 7 days or as instructed.   No alcohol for 7 days or as instructed.   Eat a soft diet for the next 24 hours.  FINDING OUT THE RESULTS OF YOUR TEST Not all test results are available during your visit. If your test results are not back during the visit, make an appointment with your caregiver to find out the results. Do not assume everything is normal if you have not heard from your caregiver or the  medical facility. It is important for you to follow up on all of your test results.  SEEK IMMEDIATE MEDICAL ATTENTION IF:  You have more than a spotting of blood in your stool.   Your belly is swollen (abdominal distention).   You are nauseated or vomiting.   You have a temperature over 101.   You have abdominal pain or discomfort that is severe or gets worse throughout the day.   Diverticulosis Diverticulosis is a condition that develops when small pouches (diverticula) form in the wall of the large intestine (colon). The colon is where water is absorbed and stool is formed. The pouches form when the inside layer of the colon pushes through weak spots in the outer layers of the colon. You may have a few pouches or many of them. What are the causes? The cause of this condition is not known. What increases the risk? The following factors may make you more likely to develop this condition:  Being older than age 31. Your risk for this condition increases with age. Diverticulosis is rare among people younger than age 54. By age 37, many people have it.  Eating a low-fiber diet.  Having frequent constipation.  Being overweight.  Not getting enough exercise.  Smoking.  Taking over-the-counter pain medicines, like aspirin and ibuprofen.  Having a family history of diverticulosis.  What are the signs or symptoms? In most people, there are no symptoms of this condition. If you do have symptoms, they may include:  Bloating.  Cramps in the abdomen.  Constipation or diarrhea.  Pain in the lower left side of the abdomen.  How is this diagnosed? This condition is most often diagnosed during an exam for other colon problems. Because diverticulosis usually has no symptoms, it often cannot be diagnosed independently. This condition may be diagnosed by:  Using a flexible scope to examine the colon (colonoscopy).  Taking an X-ray of the colon after dye has been put into the colon  (barium enema).  Doing a CT scan.  How is this treated? You may not need treatment for this condition if you have never developed an infection related to diverticulosis. If you have had an infection before, treatment may include:  Eating a high-fiber diet. This may include eating more fruits, vegetables, and grains.  Taking a fiber supplement.  Taking a live bacteria supplement (probiotic).  Taking medicine to relax your colon.  Taking antibiotic medicines.  Follow these instructions at home:  Drink 6-8 glasses of water or more each day to prevent constipation.  Try not to strain when you have a bowel movement.  If you have had an infection before: ? Eat more fiber as directed by your health care provider or your diet and nutrition specialist (dietitian). ? Take a fiber supplement or probiotic, if your health care provider approves.  Take over-the-counter and prescription medicines only as told by your health care provider.  If you were prescribed an antibiotic, take it as told by your health care provider.  Do not stop taking the antibiotic even if you start to feel better.  Keep all follow-up visits as told by your health care provider. This is important. Contact a health care provider if:  You have pain in your abdomen.  You have bloating.  You have cramps.  You have not had a bowel movement in 3 days. Get help right away if:  Your pain gets worse.  Your bloating becomes very bad.  You have a fever or chills, and your symptoms suddenly get worse.  You vomit.  You have bowel movements that are bloody or black.  You have bleeding from your rectum. Summary  Diverticulosis is a condition that develops when small pouches (diverticula) form in the wall of the large intestine (colon).  You may have a few pouches or many of them.  This condition is most often diagnosed during an exam for other colon problems.  If you have had an infection related to  diverticulosis, treatment may include increasing the fiber in your diet, taking supplements, or taking medicines. This information is not intended to replace advice given to you by your health care provider. Make sure you discuss any questions you have with your health care provider. Document Released: 06/04/2004 Document Revised: 07/27/2016 Document Reviewed: 07/27/2016 Elsevier Interactive Patient Education  2017 [employer].    Diverticulosis information provided  I do not recommend a future colonoscopy unless new symptoms develop  Office visit with [place] in 3 months

## 2018-03-21 NOTE — Anesthesia Preprocedure Evaluation (Signed)
Anesthesia Evaluation  Patient identified by MRN, date of birth, ID band Patient awake    Reviewed: Allergy & Precautions, H&P , NPO status , Patient's Chart, lab work & pertinent test results, reviewed documented beta blocker date and time   Airway Mallampati: II  TM Distance: >3 FB Neck ROM: full    Dental no notable dental hx.    Pulmonary neg pulmonary ROS, shortness of breath and with exertion, sleep apnea , COPD, former smoker,    Pulmonary exam normal breath sounds clear to auscultation       Cardiovascular Exercise Tolerance: Good hypertension, On Medications negative cardio ROS   Rhythm:regular Rate:Normal     Neuro/Psych  Headaches, Anxiety Depression  Neuromuscular disease negative neurological ROS  negative psych ROS   GI/Hepatic negative GI ROS, Neg liver ROS, hiatal hernia, PUD, GERD  ,  Endo/Other  negative endocrine ROSHypothyroidism Hyperthyroidism   Renal/GU Renal diseasenegative Renal ROS  negative genitourinary   Musculoskeletal   Abdominal   Peds  Hematology negative hematology ROS (+)   Anesthesia Other Findings Severe peripheral neuropathy b/l LEs absent DM  Reproductive/Obstetrics negative OB ROS                             Anesthesia Physical Anesthesia Plan  ASA: IV  Anesthesia Plan: MAC   Post-op Pain Management:    Induction:   PONV Risk Score and Plan:   Airway Management Planned:   Additional Equipment:   Intra-op Plan:   Post-operative Plan:   Informed Consent: I have reviewed the patients History and Physical, chart, labs and discussed the procedure including the risks, benefits and alternatives for the proposed anesthesia with the patient or authorized representative who has indicated his/her understanding and acceptance.   Dental Advisory Given  Plan Discussed with: CRNA and Anesthesiologist  Anesthesia Plan Comments:          Anesthesia Quick Evaluation

## 2018-03-21 NOTE — H&P (Signed)
@LOGO @   Primary Care Physician:  [name], MD Primary Gastroenterologist:  Dr. Gala Romney  Pre-Procedure History & Physical: HPI:  Sara [name] is a 72 y.o. female here for further evaluation of intermittent dysphagia. Also, here for 10 year average risk screening colonoscopy.  Reflux symptoms on the omeprazole 20 mg daily  Past Medical History:  Diagnosis Date  . Allergic rhinitis due to pollen   . Anxiety   . Arthritis    "knees, back, right elbow; left shoulder; right ankle" (11/20/2014)  . Breast cancer, right breast ([place])    "DCIS; zero stage" S/P mastectomy 11/20/2014  . Cervical spondylosis without myelopathy    all over  . Cervicalgia   . Chronic lower back pain   . Constipation    takes Miralax daily  . Degeneration of cervical intervertebral disc   . Depression   . Diverticulosis of colon (without mention of hemorrhage)   . GERD (gastroesophageal reflux disease)    takes Omeprazole daily  . Graves' disease    "took a pill to correct"  . H/O hiatal hernia   . H/O urinary frequency   . Headache(784.0)    denies migraines since the 80's but has occ and takes Butalbital prn  . HTN (hypertension)    takes Metoprolol and Lisinopril daily  . Hypertension   . Insomnia    takes Ativan nigtly  . Joint pain    "all of them"  . Joint swelling    "knees, legs, ankles sometimes" (11/20/2014)  . Morbid obesity ([place])   . Numbness    LOWER LEGS  . Other postablative hypothyroidism    takes SYnthroid daily  . Palpitations   . Peptic ulcer, unspecified site, unspecified as acute or chronic, without mention of hemorrhage, perforation, or obstruction   . Primary localized osteoarthrosis, lower leg   . Pure hypercholesterolemia    takes Pravastin daily  . Recurrent UTI   . Renal cell carcinoma ([place]) 01/08/2016  . Renal mass    RIGHT  . Shortness of breath   . Sleep apnea    slight but doesn't require a CPAP (11/20/2014)  . Tendonitis of knee    bilateral    Past  Surgical History:  Procedure Laterality Date  . ABDOMINAL HYSTERECTOMY    . ANTERIOR LAT LUMBAR FUSION  05/13/2012   Procedure: ANTERIOR LATERAL LUMBAR FUSION 2 LEVELS;  Surgeon: Faythe Ghee, MD;  Location: [facility];  Service: Neurosurgery;  Laterality: Left;  Left Lumbar Three-four,Lumbar four-five Extreme Lumbar Interbody Fusion with Percutaneous Pedicle Screws  . APPENDECTOMY    . BREAST BIOPSY Left ~ 2014  . BREAST BIOPSY Right 2015  . BREAST LUMPECTOMY Left 1988  . BREAST RECONSTRUCTION WITH PLACEMENT OF TISSUE EXPANDER AND FLEX HD (ACELLULAR HYDRATED DERMIS) Right 11/20/2014  . BREAST RECONSTRUCTION WITH PLACEMENT OF TISSUE EXPANDER AND FLEX HD (ACELLULAR HYDRATED DERMIS) Right 11/20/2014   Procedure: RIGHT BREAST RECONSTRUCTION WITH PLACEMENT OF TISSUE EXPANDER AND ACELLULAR DERMA MATRIX (ACELLULAR DERMA MATRIX);  Surgeon: Crissie Reese, MD;  Location: [facility];  Service: Plastics;  Laterality: Right;  . CATARACT EXTRACTION W/ INTRAOCULAR LENS  IMPLANT, BILATERAL Bilateral   . COLONOSCOPY  Sept 2008   RMR: normal rectum, left-sided diverticula, repeat in 2018  . DIAGNOSTIC LAPAROSCOPY    . DILATION AND CURETTAGE OF UTERUS    . ESOPHAGOGASTRODUODENOSCOPY    . ESOPHAGOGASTRODUODENOSCOPY  2013   Dr. Gala Romney: Cervical esophageal web and Schatzki's ring s/p dilation, small hiatal hernia, antral and duodenal  erosions likely NSAID effect, chronic duodenitis on path   . FOREIGN BODY REMOVAL Right 10/06/2013   Procedure: REMOVAL FOREIGN BODY EXTREMITY;  Surgeon: Carole Civil, MD;  Location: [facility];  Service: Orthopedics;  Laterality: Right;  . INJECTION KNEE Left 10/06/2013   Procedure: KNEE INJECTION;  Surgeon: Carole Civil, MD;  Location: [facility];  Service: Orthopedics;  Laterality: Left;  . JOINT REPLACEMENT Bilateral    knees  . KNEE ARTHROSCOPY Right   . KNEE ARTHROSCOPY WITH LATERAL MENISECTOMY Right 10/06/2013   Procedure: KNEE ARTHROSCOPY WITH LATERAL AND MEDIAL MENISECTOMY;   Surgeon: Carole Civil, MD;  Location: [facility];  Service: Orthopedics;  Laterality: Right;  END @ 1234  . lumpectomy on pelvis     "mass of nerves"  . MASTECTOMY COMPLETE / SIMPLE W/ SENTINEL NODE BIOPSY Right 11/20/2014   axillary  . REMOVAL OF TISSUE EXPANDER AND PLACEMENT OF IMPLANT Right 12/13/2014   Procedure: REMOVAL OF RIGHT BREAST TISSUE EXPANDER;  Surgeon: Crissie Reese, MD;  Location: [facility];  Service: Plastics;  Laterality: Right;  . ROBOTIC ASSITED PARTIAL NEPHRECTOMY Right 01/08/2016   Procedure: XI ROBOTIC ASSITED PARTIAL NEPHRECTOMY;  Surgeon: Alexis Frock, MD;  Location: [facility];  Service: Urology;  Laterality: Right;  . SIMPLE MASTECTOMY WITH AXILLARY SENTINEL NODE BIOPSY Right 11/20/2014   Procedure: SIMPLE MASTECTOMY WITH AXILLARY SENTINEL NODE BIOPSY;  Surgeon: Erroll Luna, MD;  Location: [facility];  Service: General;  Laterality: Right;  . TISSUE EXPANDER REMOVAL Right 12/13/2014   dr Harlow Mares  . TONSILLECTOMY    . TOTAL KNEE ARTHROPLASTY Right 01/24/2014   Procedure: TOTAL KNEE ARTHROPLASTY;  Surgeon: Carole Civil, MD;  Location: [facility];  Service: Orthopedics;  Laterality: Right;  . TOTAL KNEE ARTHROPLASTY Left 08/08/2014   Procedure: TOTAL KNEE ARTHROPLASTY;  Surgeon: Carole Civil, MD;  Location: [facility];  Service: Orthopedics;  Laterality: Left;  . TUBAL LIGATION    . UPPER GASTROINTESTINAL ENDOSCOPY      Prior to Admission medications   Medication Sig Start Date End Date Taking? Authorizing Provider  acetaminophen (TYLENOL) 500 MG tablet Take 500-1,000 mg by mouth See admin instructions. Take 500 mg in the morning and 1000 mg at night   Yes [provider]  diphenhydrAMINE (BENADRYL) 25 MG tablet Take 25-50 mg by mouth See admin instructions. Take 25 mg by mouth in the morning and 50 mg in the evening   Yes [provider]  gabapentin (NEURONTIN) 600 MG tablet TAKE 1/2 TABLET BY MOUTH  DURING THE DAY AND 1 TABLET AT BEDTIME 08/18/17  Yes  Carole Civil, MD  hydrochlorothiazide (HYDRODIURIL) 25 MG tablet Take 25 mg by mouth daily.    Yes [provider]  levothyroxine (SYNTHROID, LEVOTHROID) 88 MCG tablet Take 88 mcg by mouth daily before breakfast.    Yes [provider]  LORazepam (ATIVAN) 1 MG tablet Take 1 mg by mouth at bedtime. For sleep   Yes [provider]  omeprazole (PRILOSEC) 20 MG capsule Take 20 mg by mouth daily. May take a second 20 mg dose as needed for heartburn   Yes [provider]  oxybutynin (DITROPAN) 5 MG tablet Take 5 mg by mouth 2 (two) times daily.  07/31/15  Yes [provider]  pravastatin (PRAVACHOL) 40 MG tablet Take 40 mg by mouth every morning.    Yes [provider]  sertraline (ZOLOFT) 50 MG tablet Take 1 tablet (50 mg total) by mouth daily. 11/16/16  Yes  [name] Cancer, PA-C  tamoxifen (NOLVADEX) 20 MG tablet TAKE 1 TABLET BY MOUTH  DAILY 01/18/18  Yes Holley Bouche, NP  Ranitidine HCl (ZANTAC 75 PO) Take by mouth.    12/01/11  [provider]    Allergies as of 01/25/2018 - Review Complete 01/25/2018  Allergen Reaction Noted  . Propoxyphene hcl Other (See Comments) 09/07/2006  . Pollen extract Other (See Comments) 12/12/2014  . Camphor Itching 01/03/2014  . Dust mite extract Itching 10/06/2013  . Latex Dermatitis and Rash 06/24/2011  . Molds & smuts Itching 10/06/2013  . Tomato Hives 05/13/2012    Family History  Problem Relation Age of Onset  . Stroke Sister   . Osteoarthritis Sister   . Osteoarthritis Brother   . Obesity Brother   . Heart attack Sister   . Heart attack Father   . Lung cancer Maternal Grandmother        non-smoker  . Heart attack Unknown        2 Brothers, 3 sisters/Father died in 46's from old age  . Colon cancer Neg Hx        Mother died at 7 from blood clot, MI    Social History   Socioeconomic History  . Marital status: Married    Spouse name: Milbert Coulter  . Number of children: 4  .  Years of education: Not on file  . Highest education level: Not on file  Occupational History  . Occupation: disability    Employer: UNEMPLOYED    Comment: since 2008, knee surgery  Social Needs  . Financial resource strain: Not on file  . Food insecurity:    Worry: Not on file    Inability: Not on file  . Transportation needs:    Medical: Not on file    Non-medical: Not on file  Tobacco Use  . Smoking status: Former Smoker    Packs/day: 1.00    Years: 27.00    Pack years: 27.00    Types: Cigarettes    Last attempt to quit: 12/26/1985    Years since quitting: 32.2  . Smokeless tobacco: Never Used  . Tobacco comment: Quit in 1887  Substance and Sexual Activity  . Alcohol use: Yes    Alcohol/week: 0.0 oz    Comment: occasional wine  . Drug use: No  . Sexual activity: Yes    Birth control/protection: Surgical  Lifestyle  . Physical activity:    Days per week: Not on file    Minutes per session: Not on file  . Stress: Not on file  Relationships  . Social connections:    Talks on phone: Not on file    Gets together: Not on file    Attends religious service: Not on file    Active member of club or organization: Not on file    Attends meetings of clubs or organizations: Not on file    Relationship status: Not on file  . Intimate partner violence:    Fear of current or ex partner: Not on file    Emotionally abused: Not on file    Physically abused: Not on file    Forced sexual activity: Not on file  Other Topics Concern  . Not on file  Social History Narrative   Currently married for 10 years. Husband's name is Milbert Coulter. She is an excellent singer. 8 children between she and her husband. 23 grand children. 12 great grandchildren.    Review of Systems: See HPI, otherwise negative ROS  Physical  Exam: There were no vitals taken for this visit. General:   Alert,  Well-developed, well-nourished, pleasant and cooperative in NAD Neck:  Supple; no masses or thyromegaly. No  significant cervical adenopathy. Lungs:  Clear throughout to auscultation.   No wheezes, crackles, or rhonchi. No acute distress. Heart:  Regular rate and rhythm; no murmurs, clicks, rubs,  or gallops. Abdomen: Non-distended, normal bowel sounds.  Soft and nontender without appreciable mass or hepatosplenomegaly.  Pulses:  Normal pulses noted. Extremities:  Without clubbing or edema.  Impression/Plan:   Intermittent solid dysphagia.  Intermittent right-sided abdominal pain. Here for EGD in 10 year screening colonoscopy. The risks, benefits, limitations, imponderables and alternatives regarding both EGD and colonoscopy have been reviewed with the patient. Questions have been answered. All parties agreeable.      Notice: This dictation was prepared with Dragon dictation along with smaller phrase technology. Any transcriptional errors that result from this process are unintentional and may not be corrected upon review.

## 2018-03-21 NOTE — Anesthesia Procedure Notes (Signed)
Procedure Name: MAC Date/Time: 03/21/2018 8:30 AM Performed by: Andree Elk Amy A, CRNA Pre-anesthesia Checklist: Patient identified, Emergency Drugs available, Suction available, Patient being monitored and Timeout performed Oxygen Delivery Method: Simple face mask

## 2018-03-21 NOTE — Op Note (Signed)
[facility] Patient Name: Sara [name] Procedure Date: 03/21/2018 8:32 AM MRN: [id number] Date of Birth: 02/10/1946 Attending MD: Norvel Richards , MD CSN: [id number] Age: 72 Admit Type: Outpatient Procedure:                Upper GI endoscopy Indications:              Dysphagia Providers:                Norvel Richards, MD, Jeanann Lewandowsky. Sharon Seller, RN,                            Randa Spike, Technician Referring MD:              Medicines:                Propofol per Anesthesia Complications:            No immediate complications. Estimated Blood Loss:     Estimated blood loss: none. Procedure:                Pre-Anesthesia Assessment:                           - Prior to the procedure, a History and Physical                            was performed, and patient medications and                            allergies were reviewed. The patient's tolerance of                            previous anesthesia was also reviewed. The risks                            and benefits of the procedure and the sedation                            options and risks were discussed with the patient.                            All questions were answered, and informed consent                            was obtained. Prior Anticoagulants: The patient has                            taken no previous anticoagulant or antiplatelet                            agents. ASA Grade Assessment: II - A patient with                            mild systemic disease. After reviewing the risks  and benefits, the patient was deemed in                            satisfactory condition to undergo the procedure.                           After obtaining informed consent, the endoscope was                            passed under direct vision. Throughout the                            procedure, the patient's blood pressure, pulse, and                            oxygen saturations were  monitored continuously. The                            [id number] ([id number]) scope was introduced through the                            and advanced to the second part of duodenum. The                            upper GI endoscopy was accomplished without                            difficulty. The patient tolerated the procedure                            well. Scope In: 8:42:13 AM Scope Out: 8:51:42 AM Total Procedure Duration: 0 hours 9 minutes 29 seconds  Findings:      A mild Schatzki ring was found at the gastroesophageal junction. The       scope was withdrawn. Dilation was performed with a Maloney dilator with       mild resistance at 75 Fr. The dilation site was examined following       endoscope reinsertion and showed no change. Estimated blood loss: none.      The entire examined stomach was normal.      The duodenal bulb and second portion of the duodenum were normal. Impression:               - Mild Schatzki ring. Dilated.                           - Normal stomach.                           - Normal duodenal bulb and second portion of the                            duodenum.                           - No specimens collected. Moderate Sedation:      Moderate (  conscious) sedation was personally administered by an       anesthesia professional. The following parameters were monitored: oxygen       saturation, heart rate, blood pressure, respiratory rate, EKG, adequacy       of pulmonary ventilation, and response to care. Total physician       intraservice time was 15 minutes. Recommendation:           - Patient has a contact number available for                            emergencies. The signs and symptoms of potential                            delayed complications were discussed with the                            patient. Return to normal activities tomorrow.                            Written discharge instructions were provided to the                             patient.                           - Resume previous diet.                           - Continue present medications.                           - No repeat upper endoscopy.                           - Return to GI office in 3 months. See colonoscopy                            report. Procedure Code(s):        --- Professional ---                           [phone], Esophagogastroduodenoscopy, flexible,                            transoral; diagnostic, including collection of                            specimen(s) by brushing or washing, when performed                            (separate procedure)                           43450, Dilation of esophagus, by unguided sound or                            bougie, single or multiple  passes Diagnosis Code(s):        --- Professional ---                           K22.2, Esophageal obstruction                           R13.10, Dysphagia, unspecified CPT copyright 2017 American Medical Association. All rights reserved. The codes documented in this report are preliminary and upon coder review may  be revised to meet current compliance requirements. Cristopher Estimable. Dartanian Knaggs, MD Norvel Richards, MD 03/21/2018 8:56:06 AM This report has been signed electronically. Number of Addenda: 0

## 2018-03-21 NOTE — Op Note (Addendum)
[facility] Patient Name: Sara [name] Procedure Date: 03/21/2018 8:53 AM MRN: [id number] Date of Birth: [birth year] Attending MD: Norvel Richards , MD CSN: [id number] Age: 72 Admit Type: Outpatient Procedure:                Colonoscopy Indications:              Screening for colorectal malignant neoplasm Providers:                Norvel Richards, MD, Gwenlyn Fudge RN, RN,                            Randa Spike, Technician Referring MD:             Barrie Folk. Hill MD, MD Medicines:                Propofol per Anesthesia Complications:            No immediate complications. Estimated Blood Loss:     Estimated blood loss: none. Procedure:                Pre-Anesthesia Assessment:                           - Prior to the procedure, a History and Physical                            was performed, and patient medications and                            allergies were reviewed. The patient's tolerance of                            previous anesthesia was also reviewed. The risks                            and benefits of the procedure and the sedation                            options and risks were discussed with the patient.                            All questions were answered, and informed consent                            was obtained. Prior Anticoagulants: The patient has                            taken no previous anticoagulant or antiplatelet                            agents. ASA Grade Assessment: II - A patient with                            mild systemic disease. After reviewing the risks  and benefits, the patient was deemed in                            satisfactory condition to undergo the procedure.                           After obtaining informed consent, the colonoscope                            was passed under direct vision. Throughout the                            procedure, the patient's blood pressure, pulse, and                         oxygen saturations were monitored continuously.The                            colonoscopy was performed without difficulty. The                            patient tolerated the procedure well. The quality                            of the bowel preparation was adequate. The                            ileocecal valve, appendiceal orifice, and rectum                            were photographed. The [id number] ([id number]) scope                            was introduced through the and advanced to the the                            cecum, identified by appendiceal orifice and                            ileocecal valve. Scope In: 8:57:54 AM Scope Out: 9:21:01 AM Scope Withdrawal Time: 0 hours 5 minutes 15 seconds  Total Procedure Duration: 0 hours 23 minutes 7 seconds  Findings:      Many small-mouthed diverticula were found in the entire colon. Redundant       colon requiring external abdominal pressure and changing of the       patient's position to reach the cecum.      Non-bleeding internal hemorrhoids were found during retroflexion. The       hemorrhoids were mild, small and Grade I (internal hemorrhoids that do       not prolapse).      The exam was otherwise without abnormality on direct and retroflexion       views. Impression:               - Diverticulosis in the entire examined colon.  Redundant colon.                           - Non-bleeding internal hemorrhoids.                           - The examination was otherwise normal on direct                            and retroflexion views.                           - No specimens collected. Moderate Sedation:      Moderate (conscious) sedation was personally administered by an       anesthesia professional. The following parameters were monitored: oxygen       saturation, heart rate, blood pressure, respiratory rate, EKG, adequacy       of pulmonary ventilation, and response to care.  Total physician       intraservice time was 45 minutes. Recommendation:           - Patient has a contact number available for                            emergencies. The signs and symptoms of potential                            delayed complications were discussed with the                            patient. Return to normal activities tomorrow.                            Written discharge instructions were provided to the                            patient.                           - Resume previous diet.                           - Continue present medications. no future                            colonoscopy unless new symptoms develop. Procedure Code(s):        --- Professional ---                           [phone], Colonoscopy, flexible; diagnostic, including                            collection of specimen(s) by brushing or washing,                            when performed (separate procedure) Diagnosis Code(s):        --- Professional ---  Z12.11, Encounter for screening for malignant                            neoplasm of colon                           K64.0, First degree hemorrhoids                           K57.30, Diverticulosis of large intestine without                            perforation or abscess without bleeding CPT copyright 2018 American Medical Association. All rights reserved. The codes documented in this report are preliminary and upon coder review may  be revised to meet current compliance requirements. Cristopher Estimable. Haddie Bruhl, MD Norvel Richards, MD 03/21/2018 9:31:42 AM This report has been signed electronically. Number of Addenda: 0

## 2018-03-21 NOTE — Transfer of Care (Signed)
Immediate Anesthesia Transfer of Care Note  Patient: Sara [name]  Procedure(s) Performed: COLONOSCOPY WITH PROPOFOL (N/A ) ESOPHAGOGASTRODUODENOSCOPY (EGD) WITH PROPOFOL (N/A ) MALONEY DILATION (N/A )  Patient Location: PACU  Anesthesia Type:MAC  Level of Consciousness: awake, alert , oriented and patient cooperative  Airway & Oxygen Therapy: Patient Spontanous Breathing  Post-op Assessment: Report given to RN and Post -op Vital signs reviewed and stable  Post vital signs: Reviewed and stable  Last Vitals:  Vitals Value Taken Time  BP    Temp    Pulse 74 03/21/2018  9:29 AM  Resp    SpO2 100 % 03/21/2018  9:29 AM  Vitals shown include unvalidated device data.  Last Pain:  Vitals:   03/21/18 0834  TempSrc:   PainSc: 4          Complications: No apparent anesthesia complications

## 2018-03-25 ENCOUNTER — Encounter (HOSPITAL_COMMUNITY): Payer: Self-pay | Admitting: Internal Medicine

## 2018-03-28 DIAGNOSIS — I10 Essential (primary) hypertension: Secondary | ICD-10-CM | POA: Diagnosis not present

## 2018-04-06 ENCOUNTER — Other Ambulatory Visit (HOSPITAL_COMMUNITY): Payer: Self-pay | Admitting: *Deleted

## 2018-04-06 DIAGNOSIS — F329 Major depressive disorder, single episode, unspecified: Secondary | ICD-10-CM

## 2018-04-06 DIAGNOSIS — F32A Depression, unspecified: Secondary | ICD-10-CM

## 2018-04-06 DIAGNOSIS — F419 Anxiety disorder, unspecified: Principal | ICD-10-CM

## 2018-04-06 MED ORDER — SERTRALINE HCL 50 MG PO TABS: 50.0000 mg | ORAL_TABLET | Freq: Every day | ORAL | 0 refills | Status: DC

## 2018-04-25 DIAGNOSIS — C50911 Malignant neoplasm of unspecified site of right female breast: Secondary | ICD-10-CM | POA: Diagnosis not present

## 2018-05-03 DIAGNOSIS — I10 Essential (primary) hypertension: Secondary | ICD-10-CM | POA: Diagnosis not present

## 2018-05-09 DIAGNOSIS — C50911 Malignant neoplasm of unspecified site of right female breast: Secondary | ICD-10-CM | POA: Diagnosis not present

## 2018-05-24 DIAGNOSIS — H52223 Regular astigmatism, bilateral: Secondary | ICD-10-CM | POA: Diagnosis not present

## 2018-05-24 DIAGNOSIS — H524 Presbyopia: Secondary | ICD-10-CM | POA: Diagnosis not present

## 2018-05-24 DIAGNOSIS — H5213 Myopia, bilateral: Secondary | ICD-10-CM | POA: Diagnosis not present

## 2018-05-24 DIAGNOSIS — Z961 Presence of intraocular lens: Secondary | ICD-10-CM | POA: Diagnosis not present

## 2018-06-13 ENCOUNTER — Other Ambulatory Visit (HOSPITAL_COMMUNITY): Payer: Self-pay | Admitting: *Deleted

## 2018-06-17 ENCOUNTER — Telehealth: Payer: Self-pay | Admitting: Orthopedic Surgery

## 2018-06-20 ENCOUNTER — Other Ambulatory Visit (HOSPITAL_COMMUNITY): Payer: Self-pay | Admitting: *Deleted

## 2018-06-20 DIAGNOSIS — F419 Anxiety disorder, unspecified: Principal | ICD-10-CM

## 2018-06-20 DIAGNOSIS — F329 Major depressive disorder, single episode, unspecified: Secondary | ICD-10-CM

## 2018-06-20 DIAGNOSIS — F32A Depression, unspecified: Secondary | ICD-10-CM

## 2018-06-21 ENCOUNTER — Ambulatory Visit (INDEPENDENT_AMBULATORY_CARE_PROVIDER_SITE_OTHER): Payer: Medicare Other | Admitting: Gastroenterology

## 2018-06-21 ENCOUNTER — Encounter: Payer: Self-pay | Admitting: Gastroenterology

## 2018-06-21 VITALS — BP 134/88 | HR 103 | Temp 98.8°F | Ht 66.0 in | Wt 202.1 lb

## 2018-06-21 DIAGNOSIS — K219 Gastro-esophageal reflux disease without esophagitis: Secondary | ICD-10-CM | POA: Diagnosis not present

## 2018-06-21 DIAGNOSIS — R1310 Dysphagia, unspecified: Secondary | ICD-10-CM | POA: Diagnosis not present

## 2018-06-21 DIAGNOSIS — R1319 Other dysphagia: Secondary | ICD-10-CM

## 2018-06-21 MED ORDER — SERTRALINE HCL 50 MG PO TABS: 50.0000 mg | ORAL_TABLET | Freq: Every day | ORAL | 0 refills | Status: DC

## 2018-06-21 NOTE — Progress Notes (Signed)
Primary Care Physician: [name], MD  Primary Gastroenterologist:  Garfield Cornea, MD   Chief Complaint  Patient presents with  . Follow-up    S/p TCS & EGD    HPI: Sara [name] is a 72 y.o. female here for follow-up.  She had an EGD and colonoscopy back in July for dysphagia and colon cancer screening.  She was found to have Schatzki ring status post dilation, diverticulosis, internal hemorrhoids.  At that time she was complaining of some change in stool habits, right upper quadrant pain, different than the pain she had back in 2017.  Right upper quadrant pain worse with lying down especially on the right side, worse with prolonged sitting, improves with adjusting her position and praying. No vomiting or postprandial component.  CT in January 2019 without explanation for abdominal pain.  Clinically she is doing well.  Reports her dysphagia resolved.  Heartburn managed on omeprazole 20 mg daily.  Denies any abdominal pain or bowel concerns.   Current Outpatient Medications  Medication Sig Dispense Refill  . acetaminophen (TYLENOL) 500 MG tablet Take 500-1,000 mg by mouth See admin instructions. Take 500 mg in the morning and 1000 mg at night    . diphenhydrAMINE (BENADRYL) 25 MG tablet Take 25-50 mg by mouth See admin instructions. Take 25 mg by mouth in the morning and 50 mg in the evening    . gabapentin (NEURONTIN) 600 MG tablet TAKE 1/2 TABLET BY MOUTH  DURING THE DAY AND 1 TABLET AT BEDTIME 135 tablet 5  . hydrochlorothiazide (HYDRODIURIL) 25 MG tablet Take 25 mg by mouth daily.     Marland Kitchen levothyroxine (SYNTHROID, LEVOTHROID) 88 MCG tablet Take 88 mcg by mouth daily before breakfast.     . LORazepam (ATIVAN) 1 MG tablet Take 1 mg by mouth at bedtime. For sleep    . omeprazole (PRILOSEC) 20 MG capsule Take 20 mg by mouth daily. May take a second 20 mg dose as needed for heartburn    . oxybutynin (DITROPAN) 5 MG tablet Take 5 mg by mouth 2 (two) times daily.     .  pravastatin (PRAVACHOL) 40 MG tablet Take 40 mg by mouth every morning.     . sertraline (ZOLOFT) 50 MG tablet Take 1 tablet (50 mg total) by mouth daily. 30 tablet 0  . tamoxifen (NOLVADEX) 20 MG tablet TAKE 1 TABLET BY MOUTH  DAILY 90 tablet 2   No current facility-administered medications for this visit.     Allergies as of 06/21/2018 - Review Complete 06/21/2018  Allergen Reaction Noted  . Propoxyphene hcl Other (See Comments) 09/07/2006  . Pollen extract Other (See Comments) 12/12/2014  . Camphor Itching 01/03/2014  . Dust mite extract Itching 10/06/2013  . Latex Dermatitis and Rash 06/24/2011  . Molds & smuts Itching 10/06/2013  . Tomato Hives 05/13/2012    ROS:  General: Negative for anorexia, weight loss, fever, chills, fatigue, weakness. ENT: Negative for hoarseness, difficulty swallowing , nasal congestion. CV: Negative for chest pain, angina, palpitations, dyspnea on exertion, peripheral edema.  Respiratory: Negative for dyspnea at rest, dyspnea on exertion, cough, sputum, wheezing.  GI: See history of present illness. GU:  Negative for dysuria, hematuria, urinary incontinence, urinary frequency, nocturnal urination.  Endo: Negative for unusual weight change.    Physical Examination:   BP 134/88   Pulse (!) 103   Temp 98.8 F (37.1 C) (Oral)   Ht 5\' 6"  (1.676 m)   Wt 202 lb 1  oz (91.7 kg)   BMI 32.61 kg/m   General: Well-nourished, well-developed in no acute distress.  Eyes: No icterus. Mouth: Oropharyngeal mucosa moist and pink , no lesions erythema or exudate. Lungs: Clear to auscultation bilaterally.  Heart: Regular rate and rhythm, no murmurs rubs or gallops.  Abdomen: Bowel sounds are normal, nontender, nondistended, no hepatosplenomegaly or masses, no abdominal bruits or hernia , no rebound or guarding.   Extremities: No lower extremity edema. No clubbing or deformities. Neuro: Alert and oriented x 4   Skin: Warm and dry, no jaundice.   Psych: Alert  and cooperative, normal mood and affect.  Imaging Studies: No results found.

## 2018-06-21 NOTE — Progress Notes (Signed)
cc'd to pcp 

## 2018-06-21 NOTE — Patient Instructions (Signed)
1. Continue omeprazole 20mg  daily.  2. Call with any questions or concerns!

## 2018-06-21 NOTE — Assessment & Plan Note (Signed)
Doing well at this time.  Swallowing improved after esophageal dilation.  Denies any abdominal pain.  She will continue omeprazole 20 mg daily for now.     Return to the office as needed.

## 2018-06-22 ENCOUNTER — Other Ambulatory Visit (HOSPITAL_COMMUNITY): Payer: Self-pay | Admitting: *Deleted

## 2018-06-22 NOTE — Telephone Encounter (Signed)
Created in error

## 2018-06-23 NOTE — Telephone Encounter (Signed)
Discussed appointment, and also medication refills - states has enough refills at this time; will talk with Dr at time of appointment, which has been scheduled for 07/11/18. Patient aware.

## 2018-06-24 ENCOUNTER — Other Ambulatory Visit (HOSPITAL_COMMUNITY): Payer: Self-pay | Admitting: *Deleted

## 2018-06-24 ENCOUNTER — Encounter (HOSPITAL_COMMUNITY): Payer: Self-pay | Admitting: *Deleted

## 2018-06-24 DIAGNOSIS — F419 Anxiety disorder, unspecified: Principal | ICD-10-CM

## 2018-06-24 DIAGNOSIS — F329 Major depressive disorder, single episode, unspecified: Secondary | ICD-10-CM

## 2018-06-24 DIAGNOSIS — F32A Depression, unspecified: Secondary | ICD-10-CM

## 2018-06-24 MED ORDER — SERTRALINE HCL 50 MG PO TABS: 50.0000 mg | ORAL_TABLET | Freq: Every day | ORAL | 0 refills | Status: DC

## 2018-06-24 NOTE — Progress Notes (Signed)
Patient called today asking about refills on her medications. She was advised that it was called in on 10/1 to [place]. She verbalizes understanding. I also explained to her that she needs to get further refills from her PCP, Dr. [name] and she states that she already has an appointment with him later this month and will continue refills with him.  All questions answered to patient's satisfaction.

## 2018-07-11 ENCOUNTER — Ambulatory Visit (INDEPENDENT_AMBULATORY_CARE_PROVIDER_SITE_OTHER): Payer: Medicare Other

## 2018-07-11 ENCOUNTER — Ambulatory Visit: Payer: Self-pay | Admitting: Orthopedic Surgery

## 2018-07-11 ENCOUNTER — Encounter: Payer: Self-pay | Admitting: Orthopedic Surgery

## 2018-07-11 VITALS — BP 121/73 | HR 81 | Ht 66.0 in | Wt 205.0 lb

## 2018-07-11 DIAGNOSIS — Z96651 Presence of right artificial knee joint: Secondary | ICD-10-CM

## 2018-07-11 DIAGNOSIS — Z96652 Presence of left artificial knee joint: Secondary | ICD-10-CM

## 2018-07-11 DIAGNOSIS — M6281 Muscle weakness (generalized): Secondary | ICD-10-CM

## 2018-07-11 NOTE — Progress Notes (Signed)
ANNUAL FOLLOW UP FOR  BILATERAL  TKA   Chief Complaint  Patient presents with  . Knee Pain    Bilat TKA 01/24/14 Right, 08/08/14 Left     HPI: The patient is here for the annual  follow-up x-ray for knee replacement. The patient is not complaining of pain weakness instability or stiffness in the repaired knee.   Review of Systems  Musculoskeletal: Positive for back pain.  Neurological: Positive for tingling, sensory change and focal weakness.    Past Medical History:  Diagnosis Date  . Allergic rhinitis due to pollen   . Anxiety   . Arthritis    "knees, back, right elbow; left shoulder; right ankle" (11/20/2014)  . Breast cancer, right breast ([place])    "DCIS; zero stage" S/P mastectomy 11/20/2014  . Cervical spondylosis without myelopathy    all over  . Cervicalgia   . Chronic lower back pain   . Constipation    takes Miralax daily  . Degeneration of cervical intervertebral disc   . Depression   . Diverticulosis of colon (without mention of hemorrhage)   . GERD (gastroesophageal reflux disease)    takes Omeprazole daily  . Graves' disease    "took a pill to correct"  . H/O hiatal hernia   . H/O urinary frequency   . Headache(784.0)    denies migraines since the 80's but has occ and takes Butalbital prn  . HTN (hypertension)    takes Metoprolol and Lisinopril daily  . Hypertension   . Insomnia    takes Ativan nigtly  . Joint pain    "all of them"  . Joint swelling    "knees, legs, ankles sometimes" (11/20/2014)  . Morbid obesity ([place])   . Numbness    LOWER LEGS  . Other postablative hypothyroidism    takes SYnthroid daily  . Palpitations   . Peptic ulcer, unspecified site, unspecified as acute or chronic, without mention of hemorrhage, perforation, or obstruction   . Primary localized osteoarthrosis, lower leg   . Pure hypercholesterolemia    takes Pravastin daily  . Recurrent UTI   . Renal cell carcinoma ([place]) 01/08/2016  . Renal mass    RIGHT  . Shortness of  breath   . Sleep apnea    slight but doesn't require a CPAP (11/20/2014)  . Tendonitis of knee    bilateral     Examination of the BOTH KNEES  BP 121/73   Pulse 81   Ht 5\' 6"  (1.676 m)   Wt 205 lb (93 kg)   BMI 33.09 kg/m   General the patient is normally groomed in no distress  Mood normal Affect pleasant   The patient is Awake and alert ; oriented normal   Inspection shows : incision healed nicely without erythema, no tenderness no swelling of the legs  Range of motion total range of motion is 115 RIGHT KNEE  AND 110 LEFT KNEE   Stability the knee is stable anterior to posterior as well as medial to lateral  Strength quadriceps strength is normal on the right mild weakness on the left  Skin no erythema around the skin incision knees  Cardiovascular NO EDEMA leg   neuro: normal sensation in the right leg altered sensation left leg   gait: Mild limp requires cane  Medical decision-making section  X-rays ordered with the following personal interpretation  Normal alignment without loosening BOTH KNEES   Diagnosis  Encounter Diagnoses  Name Primary?  . S/P total knee replacement, right  01/24/14 Yes  . S/P total knee replacement, left 08/08/14   . Quadriceps weakness    Recommend home exercises for the quadriceps weakness on the left FU 1 YEAR BILATERAL X-RAYS   Plan follow-up 1 year repeat x-rays BOTH KNEES

## 2018-07-11 NOTE — Patient Instructions (Signed)
Home quadricep strengthening program follow-up in 1 year

## 2018-07-18 DIAGNOSIS — I10 Essential (primary) hypertension: Secondary | ICD-10-CM | POA: Diagnosis not present

## 2018-07-18 DIAGNOSIS — Z0000 Encounter for general adult medical examination without abnormal findings: Secondary | ICD-10-CM | POA: Diagnosis not present

## 2018-08-25 DIAGNOSIS — I10 Essential (primary) hypertension: Secondary | ICD-10-CM | POA: Diagnosis not present

## 2018-08-25 DIAGNOSIS — J309 Allergic rhinitis, unspecified: Secondary | ICD-10-CM | POA: Diagnosis not present

## 2018-08-25 DIAGNOSIS — H8111 Benign paroxysmal vertigo, right ear: Secondary | ICD-10-CM | POA: Diagnosis not present

## 2018-09-02 ENCOUNTER — Other Ambulatory Visit (HOSPITAL_COMMUNITY): Payer: Self-pay | Admitting: Nurse Practitioner

## 2018-09-02 DIAGNOSIS — F329 Major depressive disorder, single episode, unspecified: Secondary | ICD-10-CM

## 2018-09-02 DIAGNOSIS — F32A Depression, unspecified: Secondary | ICD-10-CM

## 2018-09-02 DIAGNOSIS — F419 Anxiety disorder, unspecified: Principal | ICD-10-CM

## 2018-09-05 IMAGING — MG 2D DIGITAL SCREENING UNILATERAL LEFT MAMMOGRAM WITH CAD AND ADJU
5 series · 6 of 13 positions shown · non-contrast
Comparison: Previous exam(s).

CLINICAL DATA: Screening.

EXAM:
2D DIGITAL SCREENING UNILATERAL LEFT MAMMOGRAM WITH CAD AND ADJUNCT
TOMO

[L MLO (1 of 2)]
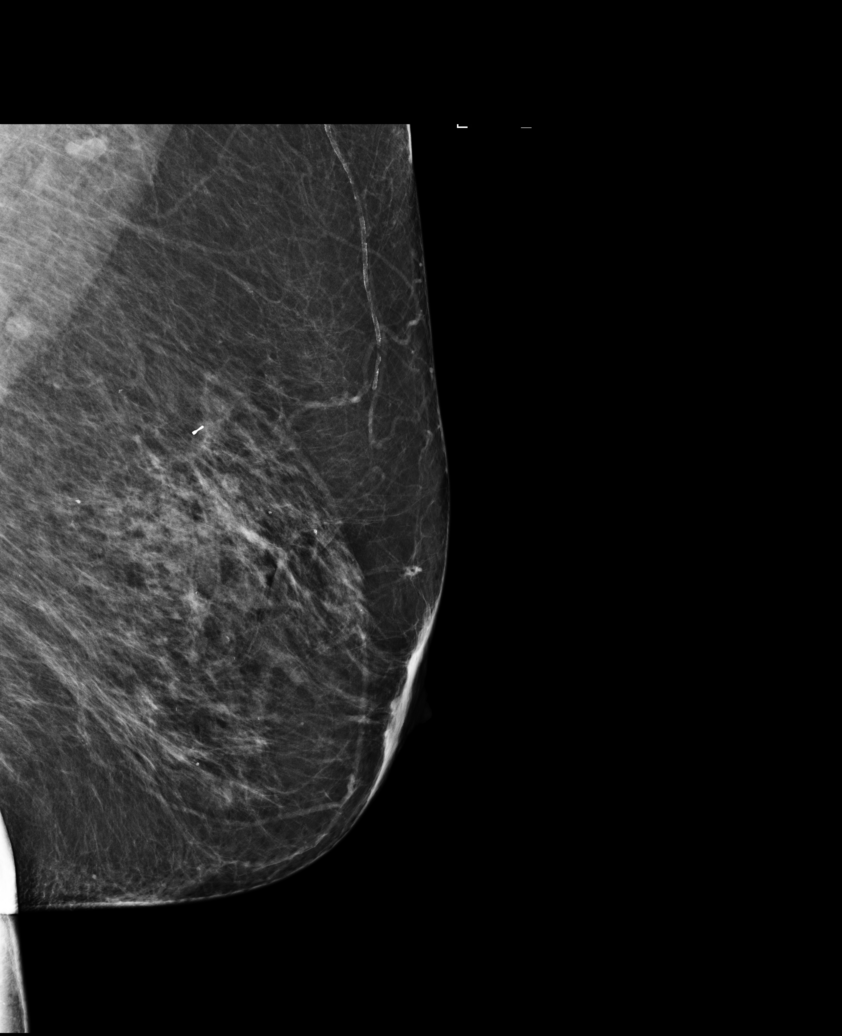

[L CC]
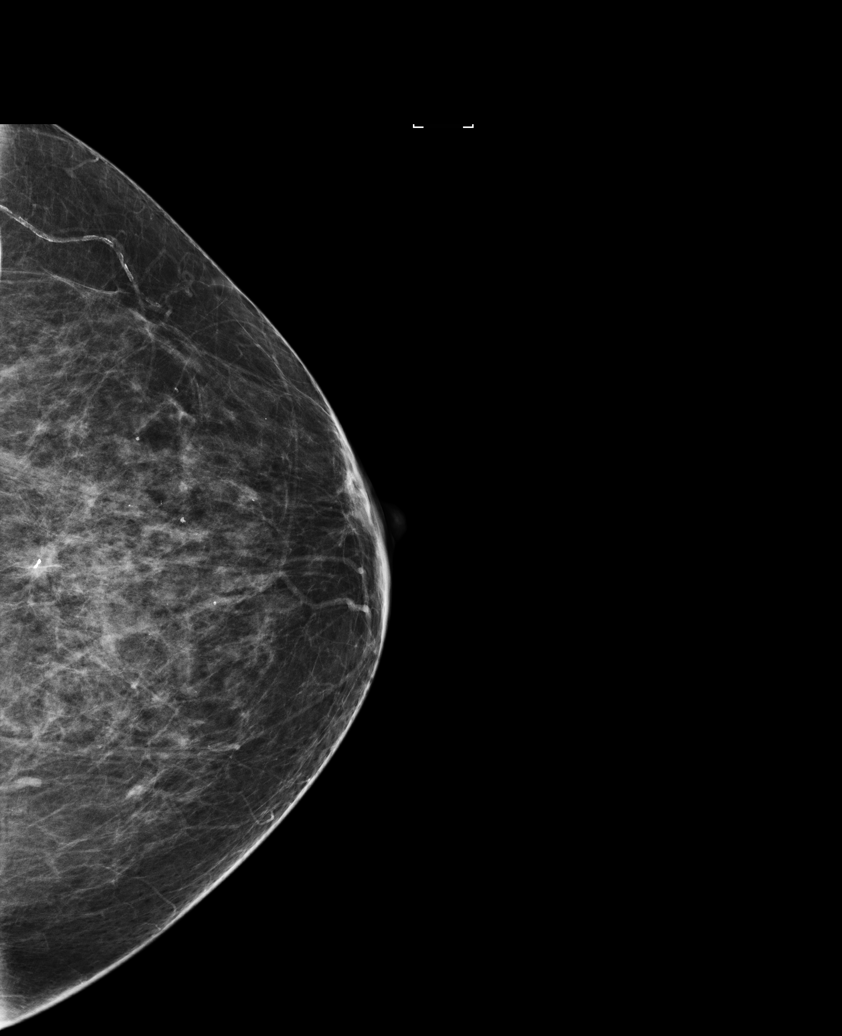

[L MLO (2 of 2)]
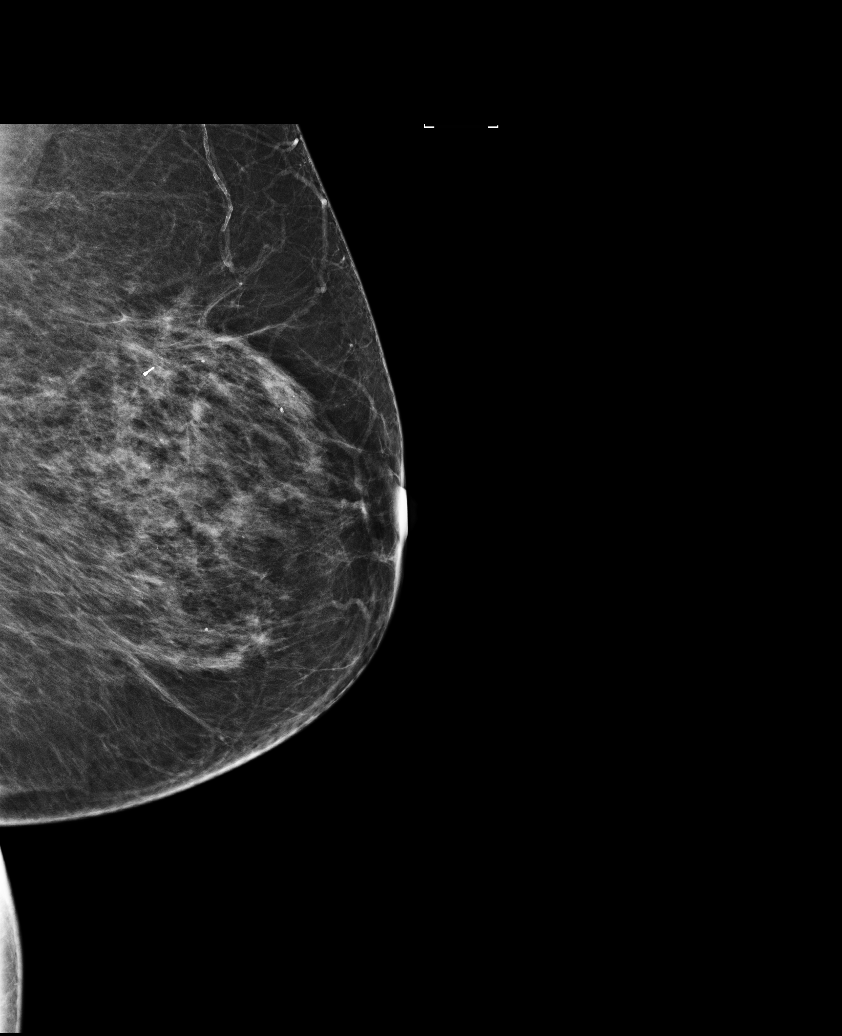

[L MLO tomo · 2 of 85 frames shown]
[frame 28/85]
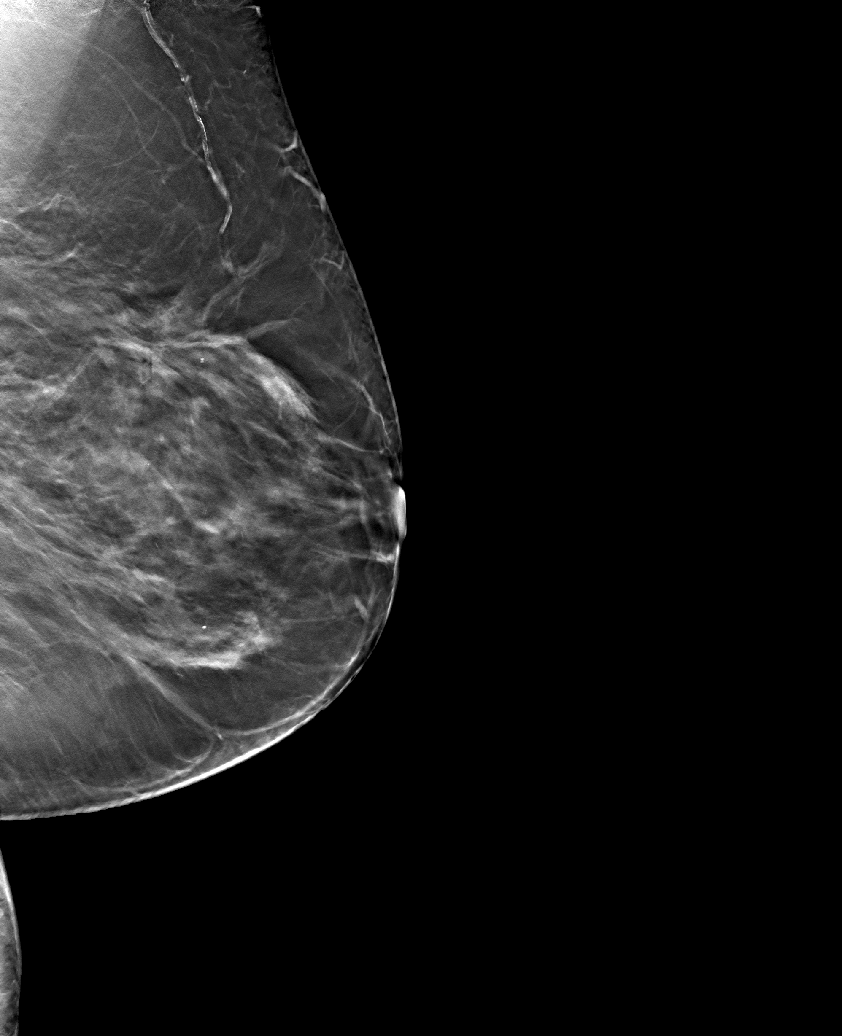
[frame 43/85]
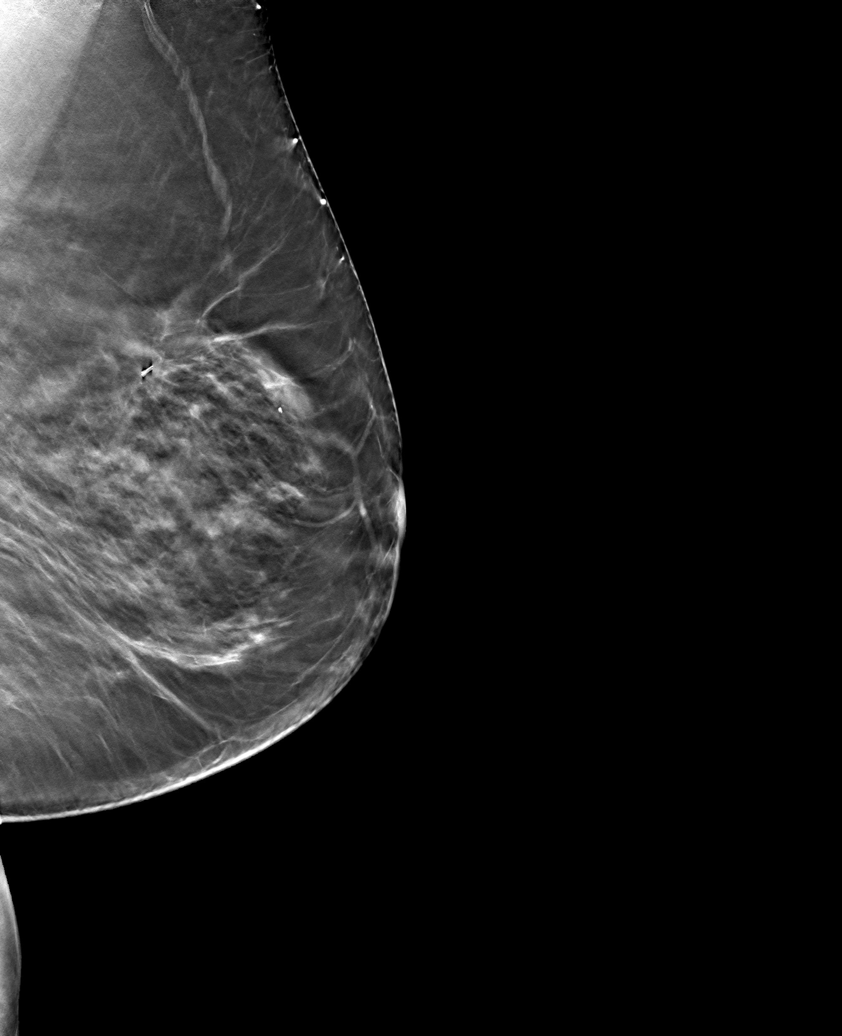

[L CC tomo · tomo slice 38/75.0]
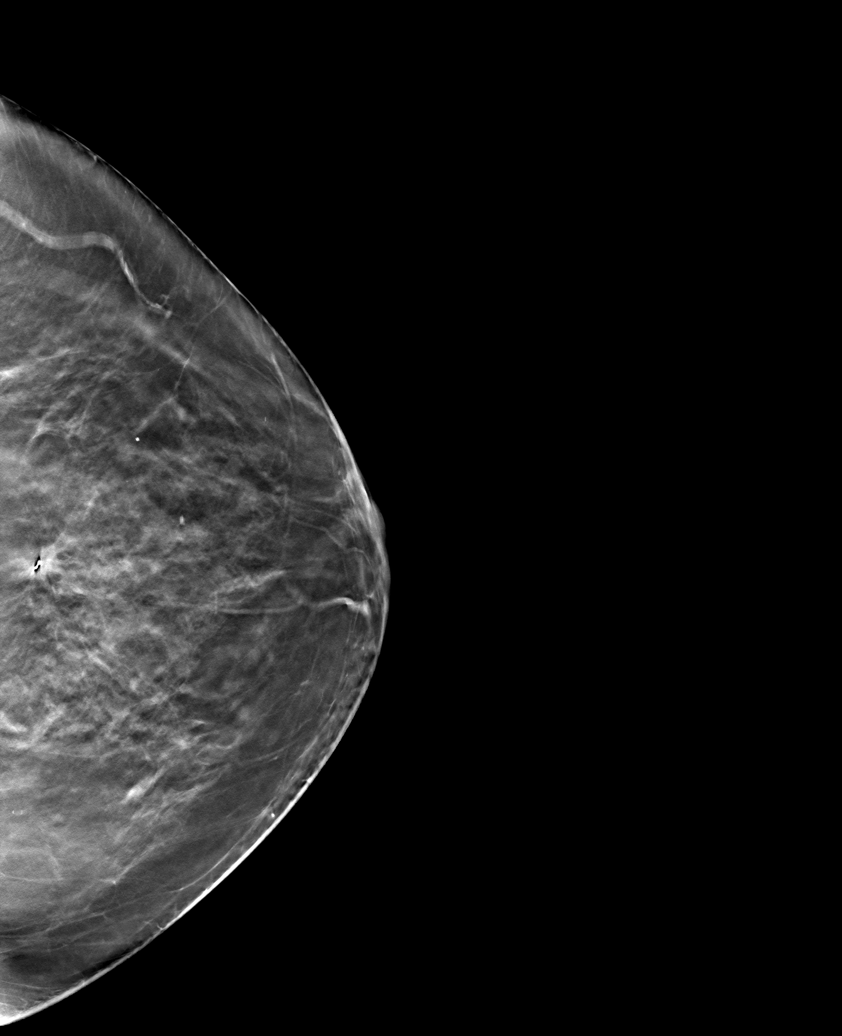

[6 of 13 positions shown; findings below may reference images not displayed]

ACR Breast Density Category b: There are scattered areas of
fibroglandular density.
FINDINGS: There are no findings suspicious for malignancy. Images were
processed with CAD.
IMPRESSION: No mammographic evidence of malignancy. A result letter of this
screening mammogram will be mailed directly to the patient.

RECOMMENDATION:
Screening mammogram in one year. (Code:ED-0-97R)

BI-RADS CATEGORY  1: Negative.

## 2018-09-16 ENCOUNTER — Ambulatory Visit (HOSPITAL_COMMUNITY)
Admission: RE | Admit: 2018-09-16 | Discharge: 2018-09-16 | Disposition: A | Discharge status: Home / Self Care | Payer: Medicare Other | Source: Ambulatory Visit | Attending: Internal Medicine | Admitting: Internal Medicine

## 2018-09-16 DIAGNOSIS — Z1231 Encounter for screening mammogram for malignant neoplasm of breast: Secondary | ICD-10-CM | POA: Insufficient documentation

## 2018-09-19 ENCOUNTER — Ambulatory Visit (HOSPITAL_COMMUNITY): Payer: Medicare Other | Admitting: Internal Medicine

## 2018-09-26 ENCOUNTER — Encounter (HOSPITAL_COMMUNITY): Payer: Self-pay | Admitting: Internal Medicine

## 2018-09-26 ENCOUNTER — Other Ambulatory Visit: Payer: Self-pay

## 2018-09-26 ENCOUNTER — Inpatient Hospital Stay (HOSPITAL_COMMUNITY): Payer: Medicare Other | Attending: Hematology | Admitting: Internal Medicine

## 2018-09-26 VITALS — BP 128/70 | HR 78 | Temp 98.2°F | Resp 16 | Wt 201.5 lb

## 2018-09-26 DIAGNOSIS — C641 Malignant neoplasm of right kidney, except renal pelvis: Secondary | ICD-10-CM | POA: Insufficient documentation

## 2018-09-26 DIAGNOSIS — D0511 Intraductal carcinoma in situ of right breast: Secondary | ICD-10-CM | POA: Diagnosis not present

## 2018-09-26 DIAGNOSIS — C649 Malignant neoplasm of unspecified kidney, except renal pelvis: Secondary | ICD-10-CM

## 2018-09-26 DIAGNOSIS — E039 Hypothyroidism, unspecified: Secondary | ICD-10-CM | POA: Insufficient documentation

## 2018-09-26 DIAGNOSIS — Z87891 Personal history of nicotine dependence: Secondary | ICD-10-CM | POA: Insufficient documentation

## 2018-09-26 DIAGNOSIS — Z9011 Acquired absence of right breast and nipple: Secondary | ICD-10-CM | POA: Diagnosis not present

## 2018-09-26 DIAGNOSIS — R5383 Other fatigue: Secondary | ICD-10-CM | POA: Insufficient documentation

## 2018-09-26 DIAGNOSIS — I10 Essential (primary) hypertension: Secondary | ICD-10-CM | POA: Diagnosis not present

## 2018-09-26 DIAGNOSIS — E669 Obesity, unspecified: Secondary | ICD-10-CM

## 2018-09-26 MED ORDER — TAMOXIFEN CITRATE 20 MG PO TABS: 20.0000 mg | ORAL_TABLET | Freq: Every day | ORAL | 2 refills | Status: DC

## 2018-09-26 NOTE — Progress Notes (Signed)
Diagnosis Ductal carcinoma in situ (DCIS) of right breast - Plan: CBC with Differential/Platelet, Comprehensive metabolic panel, Lactate dehydrogenase, Ferritin, T4 AND TSH  Renal cell carcinoma, unspecified laterality ([place]) - Plan: CBC with Differential/Platelet, Comprehensive metabolic panel, Lactate dehydrogenase, Ferritin, T4 AND TSH  Other fatigue - Plan: CBC with Differential/Platelet, Comprehensive metabolic panel, Lactate dehydrogenase, Ferritin, T4 AND TSH  Staging Cancer Staging Malignant neoplasm of upper outer quadrant of female breast ([place]) Staging form: Breast, AJCC 7th Edition - Clinical: Stage 0 (Tis (DCIS), N0, M0) - Signed by [name], MD on 10/11/2014  Renal cell carcinoma [facility]) Staging form: Kidney, AJCC 7th Edition - Clinical stage from 06/02/2016: Stage Unknown (T1a, NX, M0) - Signed by [name] Cancer, PA-C on 06/02/2016   CURRENT THERAPY: Tamoxifen daily beginning on 01/01/2015  Assessment and Plan:  1. Extensive DCIS/LCIS of right breast, S/P simple right mastectomy and SLN which was negative for metastatic disease on 11/20/2014 by Dr. Brantley Stage.  She is S/P hysterectomy with ovarian remnants (CT imaging 11/26/2015).  Patient is planned for tamoxifen for 5 years which she began in 2016 with anticipated completion of therapy in 20/21.  Left screening mammogram done 09/16/2018 reviewed and showed IMPRESSION: No mammographic evidence of malignancy.   She will undergo left screening mammogram in December 2020.    Pt will RTC in 02/2019 for follow-up and labs.  Rx for Tamoxifen # 90 with 2 refills sent to pharmacy.    2  Fatigue.   Labs done 02/2018 reviewed and showed HB 13.  Pt is on synthroid.  Pt advised to follow-up with PCP for monitoring of thyroid levels.  Will check levels on RTC in 02/2019 with labs.   3.  T1 a NX right renal cell cancer grade 2.  Pt is S/P renal wedge resection/partial resection by Dr. Tresa Moore on 01/08/2016.  Pt had outside CT abdomen and  pelvis done 10/01/2017 that showed right partial nephrectomy and fatty liver.  She reports she is scheduled for repeat imaging with Dr. Tresa Moore. Continue to follow-up with urology as recommended.  4.  Hypertension.  Blood pressure is 128/70.  Follow-up with PCP.  25 minutes spent with more than 50% spent in counseling and coordination of care and review of records.    INTERVAL HISTORY: 73 y.o. female returns for followup of extensive DCIS/LCIS of right breast, S/P simple right mastectomy and SLN which was negative for metastatic disease on 11/20/2014 by Dr. Brantley Stage. She is S/P hysterectomy with ovarian remnants (CT imaging 11/26/2015).    Pt also has H/O right renal cell carcinoma, grade 2, S/P renal wedge resection/partial resection by Dr. Tresa Moore on 01/08/2016.  Stage: pT1ApNX.  Current status.  Patient is seen today for follow-up.  She is here to go over recent mammogram.  She remains on tamoxifen. She reports fatigue.       Malignant neoplasm of upper outer quadrant of female breast ([place])   09/05/2014 Imaging    assymetry noted in R breast on screening mammogram    09/18/2014 Imaging    Irregular mass in the 12 o'clock location of the right breast. Tissue diagnosis is recommended.    09/18/2014 Imaging    IMPRESSION: Irregular mass in the 12 o'clock location of the right breast. Tissue diagnosis is recommended.    10/09/2014 Imaging    8 x 12 x 8 CM abnormal linear clumped enhancement throughout the majority of the central and upper right breast.    10/17/2014 Initial Biopsy    Breast, right, needle  core biopsy, central - DUCTAL CARCINOMA IN SITU, MIXED PATTERNS (CRIBRIFORM, COMEDO AND PAPILLARY), INVOLVING MULTIPLE CORES, AT LEAST 9 MM IN MAXIMAL EXTENT IN ANY ONE CORE. - FOCAL LOBULAR NEOPLASIA (ALH/LCIS). - NO INVASIVE CAR    11/20/2014 Definitive Surgery    Breast, simple mastectomy, right - DUCTAL CARCINOMA IN SITU, SEE COMMENT. - LOBULAR CARCINOMA IN SITU (CONVENTIONAL AND PLEOMORPHIC  TYPES). - PREVIOUS BIOPSY SITES (X2). - IN SITU CARCINOMA IS 3CM FROM NEAREST MARGIN (DEEP).    01/01/2015 -  Anti-estrogen oral therapy    Tamoxifen daily.    07/21/2016 Breast US    BIRADS 4    07/29/2016 Procedure    Ultrasound guided biopsy of right axillary mass/lymph node. No apparent complications.    07/30/2016 Pathology Results    Pathology of the right axillary lymph node biopsy revealed LYMPHOID TISSUE WITH DERMATOPATHIC CHANGE. NO METASTATIC CARCINOMA IDENTIFIED.      Renal cell carcinoma ([place])   01/08/2016 Procedure    Robotic-assisted laparoscopic right partial nephrectomy by Dr. Tresa Moore.    01/08/2016 Pathology Results    Kidney, wedge excision / partial resection, right renal mass RENAL CELL CARCINOMA, CLEAR CELL TYPE, WHO NUCLEAR GRADE 2 (2.4 CM) THE TUMOR IS CONFINED TO THE KIDNEY MARGINS OF RESECTION ARE NEGATIVE FOR TUMOR      Problem List Patient Active Problem List   Diagnosis Date Noted  . Encounter for screening colonoscopy [Z12.11] 01/25/2018  . Renal cell carcinoma ([place]) [C64.9] 01/08/2016  . Right kidney mass [N28.89] 12/23/2015  . Liver lesion [K76.9] 12/23/2015  . Malignant neoplasm of upper outer quadrant of female breast ([place]) [C50.419] 10/11/2014  . S/P total knee replacement, right 01/24/14 [Z96.651] 08/08/2014  . Difficulty in walking(719.7) [R26.2] 02/15/2014  . Postoperative stiffness of total knee replacement ([place]) [T84.89XA, M25.669, Z96.659] 02/15/2014  . Pain in joint, lower leg [M25.569] 02/15/2014  . Abnormality of gait [R26.9] 10/11/2013  . S/P total knee replacement, left 08/08/14 [Z96.652] 10/09/2013  . Radicular pain of right lower extremity [M54.10] 07/25/2013  . Knee instability [M25.369] 07/25/2013  . Right knee pain [M25.561] 07/25/2013  . Chest pain [R07.9] 04/25/2013  . Patellar tendonitis [M76.50] 09/07/2012  . OA (osteoarthritis) of knee [M17.10] 02/09/2012  . GERD (gastroesophageal reflux disease) [K21.9] 12/01/2011  .  Dysphagia [R13.10] 12/01/2011  . RUQ pain [R10.11] 12/01/2011  . Fatty liver [K76.0] 12/01/2011  . Hematochezia [K92.1] 12/01/2011  . INTERMITTENT VERTIGO [R42] 09/12/2010  . KNEE, ARTHRITIS, DEGEN./OSTEO [M17.10] 04/03/2009  . CHEST PAIN, RECURRENT [R07.9] 03/07/2009  . OTHER DYSPHAGIA [R13.19] 03/07/2009  . PALPITATIONS [R00.2] 02/06/2009  . LIVER FUNCTION TESTS, ABNORMAL, HX OF [Z86.39, Z86.2] 01/01/2009  . MORBID OBESITY [E66.01] 12/31/2008  . PUD [K27.9] 12/31/2008  . BACK PAIN [M54.9] 12/24/2008  . ANSERINE BURSITIS [YQM5784] 12/24/2008  . BENIGN POSITIONAL VERTIGO [H81.10] 07/16/2008  . H N P-LUMBAR [M51.26] 04/02/2008  . SPINAL STENOSIS, CERVICAL [M48.02] 02/15/2008  . SPINAL STENOSIS, LUMBAR [M48.061] 02/15/2008  . Patellar tendinitis [M76.50] 10/19/2007  . TEAR LATERAL MENISCUS [S83.289A] 06/13/2007  . OBESITY NOS [E66.9] 10/29/2006  . ALLERGIC RHINITIS, SEASONAL [J30.1] 10/29/2006  . HYPOTHYROIDISM [E03.9] 09/07/2006  . HYPERLIPIDEMIA [E78.5] 09/07/2006  . HYPERTENSION [I10] 09/07/2006  . GERD [K21.9] 09/07/2006  . DIVERTICULOSIS, COLON [K57.30] 09/07/2006  . OVERACTIVE BLADDER [N31.8] 09/07/2006  . FIBROCYSTIC BREAST DISEASE [N60.19] 09/07/2006  . OSTEOARTHRITIS [M19.90] 09/07/2006    Past Medical History Past Medical History:  Diagnosis Date  . Allergic rhinitis due to pollen   . Anxiety   . Arthritis    "  knees, back, right elbow; left shoulder; right ankle" (11/20/2014)  . Breast cancer, right breast ([place])    "DCIS; zero stage" S/P mastectomy 11/20/2014  . Cervical spondylosis without myelopathy    all over  . Cervicalgia   . Chronic lower back pain   . Constipation    takes Miralax daily  . Degeneration of cervical intervertebral disc   . Depression   . Diverticulosis of colon (without mention of hemorrhage)   . GERD (gastroesophageal reflux disease)    takes Omeprazole daily  . Graves' disease    "took a pill to correct"  . H/O hiatal hernia   . H/O  urinary frequency   . Headache(784.0)    denies migraines since the 80's but has occ and takes Butalbital prn  . HTN (hypertension)    takes Metoprolol and Lisinopril daily  . Hypertension   . Insomnia    takes Ativan nigtly  . Joint pain    "all of them"  . Joint swelling    "knees, legs, ankles sometimes" (11/20/2014)  . Morbid obesity ([place])   . Numbness    LOWER LEGS  . Other postablative hypothyroidism    takes SYnthroid daily  . Palpitations   . Peptic ulcer, unspecified site, unspecified as acute or chronic, without mention of hemorrhage, perforation, or obstruction   . Primary localized osteoarthrosis, lower leg   . Pure hypercholesterolemia    takes Pravastin daily  . Recurrent UTI   . Renal cell carcinoma ([place]) 01/08/2016  . Renal mass    RIGHT  . Shortness of breath   . Sleep apnea    slight but doesn't require a CPAP (11/20/2014)  . Tendonitis of knee    bilateral    Past Surgical History Past Surgical History:  Procedure Laterality Date  . ABDOMINAL HYSTERECTOMY    . ANTERIOR LAT LUMBAR FUSION  05/13/2012   Procedure: ANTERIOR LATERAL LUMBAR FUSION 2 LEVELS;  Surgeon: Faythe Ghee, MD;  Location: [place] [facility];  Service: Neurosurgery;  Laterality: Left;  Left Lumbar Three-four,Lumbar four-five Extreme Lumbar Interbody Fusion with Percutaneous Pedicle Screws  . APPENDECTOMY    . BREAST BIOPSY Left ~ 2014  . BREAST BIOPSY Right 2015  . BREAST LUMPECTOMY Left 1988  . BREAST RECONSTRUCTION WITH PLACEMENT OF TISSUE EXPANDER AND FLEX HD (ACELLULAR HYDRATED DERMIS) Right 11/20/2014  . BREAST RECONSTRUCTION WITH PLACEMENT OF TISSUE EXPANDER AND FLEX HD (ACELLULAR HYDRATED DERMIS) Right 11/20/2014   Procedure: RIGHT BREAST RECONSTRUCTION WITH PLACEMENT OF TISSUE EXPANDER AND ACELLULAR DERMA MATRIX (ACELLULAR DERMA MATRIX);  Surgeon: Crissie Reese, MD;  Location: [place];  Service: Plastics;  Laterality: Right;  . CATARACT EXTRACTION W/ INTRAOCULAR LENS  IMPLANT, BILATERAL  Bilateral   . COLONOSCOPY  Sept 2008   RMR: normal rectum, left-sided diverticula, repeat in 2018  . COLONOSCOPY WITH PROPOFOL N/A 03/21/2018   Dr. Gala Romney: diverticulosis, internal grade I hemorrhoids. no future colonoscopies unless develops new symptoms  . DIAGNOSTIC LAPAROSCOPY    . DILATION AND CURETTAGE OF UTERUS    . ESOPHAGOGASTRODUODENOSCOPY    . ESOPHAGOGASTRODUODENOSCOPY  2013   Dr. Gala Romney: Cervical esophageal web and Schatzki's ring s/p dilation, small hiatal hernia, antral and duodenal erosions likely NSAID effect, chronic duodenitis on path   . ESOPHAGOGASTRODUODENOSCOPY (EGD) WITH PROPOFOL N/A 03/21/2018   Dr. Gala Romney: mild Schatzki ring s/p dilation  . FOREIGN BODY REMOVAL Right 10/06/2013   Procedure: REMOVAL FOREIGN BODY EXTREMITY;  Surgeon: Carole Civil, MD;  [facility];  Service: Orthopedics;  Laterality: Right;  . INJECTION KNEE Left 10/06/2013   Procedure: KNEE INJECTION;  Surgeon: Carole Civil, MD;  Location: [facility];  Service: Orthopedics;  Laterality: Left;  . JOINT REPLACEMENT Bilateral    knees  . KNEE ARTHROSCOPY Right   . KNEE ARTHROSCOPY WITH LATERAL MENISECTOMY Right 10/06/2013   Procedure: KNEE ARTHROSCOPY WITH LATERAL AND MEDIAL MENISECTOMY;  Surgeon: Carole Civil, MD;  Location: [facility];  Service: Orthopedics;  Laterality: Right;  END @ 1234  . lumpectomy on pelvis     "mass of nerves"  . MALONEY DILATION N/A 03/21/2018   Procedure: Venia Minks DILATION;  Surgeon: Daneil Dolin, MD;  [facility];  Service: Endoscopy;  Laterality: N/A;  . MASTECTOMY COMPLETE / SIMPLE W/ SENTINEL NODE BIOPSY Right 11/20/2014   axillary  . REMOVAL OF TISSUE EXPANDER AND PLACEMENT OF IMPLANT Right 12/13/2014   Procedure: REMOVAL OF RIGHT BREAST TISSUE EXPANDER;  Surgeon: Crissie Reese, MD;  Location: [place];  Service: Plastics;  Laterality: Right;  . ROBOTIC ASSITED PARTIAL NEPHRECTOMY Right 01/08/2016   Procedure: XI ROBOTIC ASSITED PARTIAL NEPHRECTOMY;   Surgeon: Alexis Frock, MD;  Location: [facility];  Service: Urology;  Laterality: Right;  . SIMPLE MASTECTOMY WITH AXILLARY SENTINEL NODE BIOPSY Right 11/20/2014   Procedure: SIMPLE MASTECTOMY WITH AXILLARY SENTINEL NODE BIOPSY;  Surgeon: Erroll Luna, MD;  Location: [facility];  Service: General;  Laterality: Right;  . TISSUE EXPANDER REMOVAL Right 12/13/2014   dr Harlow Mares  . TONSILLECTOMY    . TOTAL KNEE ARTHROPLASTY Right 01/24/2014   Procedure: TOTAL KNEE ARTHROPLASTY;  Surgeon: Carole Civil, MD;  [facility];  Service: Orthopedics;  Laterality: Right;  . TOTAL KNEE ARTHROPLASTY Left 08/08/2014   Procedure: TOTAL KNEE ARTHROPLASTY;  Surgeon: Carole Civil, MD;  [facility];  Service: Orthopedics;  Laterality: Left;  . TUBAL LIGATION    . UPPER GASTROINTESTINAL ENDOSCOPY      Family History Family History  Problem Relation Age of Onset  . Stroke Sister   . Osteoarthritis Sister   . Osteoarthritis Brother   . Obesity Brother   . Heart attack Sister   . Heart attack Father   . Lung cancer Maternal Grandmother        non-smoker  . Heart attack Unknown        2 Brothers, 3 sisters/Father died in 79's from old age  . Colon cancer Neg Hx        Mother died at 10 from blood clot, MI     Social History  reports that she quit smoking about 32 years ago. Her smoking use included cigarettes. She has a 27.00 pack-year smoking history. She has never used smokeless tobacco. She reports current alcohol use. She reports that she does not use drugs.  Medications  Current Outpatient Medications:  .  fexofenadine (ALLEGRA) 180 MG tablet, Take 180 mg by mouth daily., Disp: , Rfl:  .  acetaminophen (TYLENOL) 500 MG tablet, Take 650 mg by mouth See admin instructions. Pt takes 650 mg in the morning and 650 mg at night, Disp: , Rfl:  .  gabapentin (NEURONTIN) 600 MG tablet, TAKE 1/2 TABLET BY MOUTH  DURING THE DAY AND 1 TABLET AT BEDTIME, Disp: 135 tablet, Rfl: 5 .  hydrochlorothiazide  (HYDRODIURIL) 25 MG tablet, Take 25 mg by mouth daily. , Disp: , Rfl:  .  levothyroxine (SYNTHROID, LEVOTHROID) 88 MCG tablet, Take 88 mcg by mouth daily before breakfast. , Disp: , Rfl:  .  omeprazole (PRILOSEC) 20 MG capsule, Take 20 mg by mouth daily. May take a second 20 mg dose as needed for heartburn, Disp: , Rfl:  .  oxybutynin (DITROPAN) 5 MG tablet, Take 5 mg by mouth 2 (two) times daily. , Disp: , Rfl:  .  pravastatin (PRAVACHOL) 40 MG tablet, Take 40 mg by mouth every morning. , Disp: , Rfl:  .  sertraline (ZOLOFT) 50 MG tablet, TAKE 1 TABLET BY MOUTH  DAILY, Disp: 90 tablet, Rfl: 0 .  tamoxifen (NOLVADEX) 20 MG tablet, TAKE 1 TABLET BY MOUTH  DAILY, Disp: 90 tablet, Rfl: 2  Allergies Propoxyphene hcl; Pollen extract; Camphor; Dust mite extract; Latex; Molds & smuts; and Tomato  Review of Systems Review of Systems - Oncology ROS negative other than fatigue   Physical Exam  Vitals Wt Readings from Last 3 Encounters:  09/26/18 201 lb 8 oz (91.4 kg)  07/11/18 205 lb (93 kg)  06/21/18 202 lb 1 oz (91.7 kg)   Temp Readings from Last 3 Encounters:  09/26/18 98.2 F (36.8 C) (Oral)  06/21/18 98.8 F (37.1 C) (Oral)  03/21/18 97.8 F (36.6 C)   BP Readings from Last 3 Encounters:  09/26/18 128/70  07/11/18 121/73  06/21/18 134/88   Pulse Readings from Last 3 Encounters:  09/26/18 78  07/11/18 81  06/21/18 (!) 103   Constitutional: Well-developed, well-nourished, and in no distress.   HENT: Head: Normocephalic and atraumatic.  Mouth/Throat: No oropharyngeal exudate. Mucosa moist. Eyes: Pupils are equal, round, and reactive to light. Conjunctivae are normal. No scleral icterus.  Neck: Normal range of motion. Neck supple. No JVD present.  Cardiovascular: Normal rate, regular rhythm and normal heart sounds.  Exam reveals no gallop and no friction rub.   No murmur heard. Pulmonary/Chest: Effort normal and breath sounds normal. No respiratory distress. No wheezes.No  rales.  Abdominal: Soft. Bowel sounds are normal. No distension. There is no tenderness. There is no guarding.  Musculoskeletal: No edema or tenderness.  Lymphadenopathy: No cervical, axillary or supraclavicular adenopathy.  Neurological: Alert and oriented to person, place, and time. No cranial nerve deficit.  Skin: Skin is warm and dry. No rash noted. No erythema. No pallor.  Psychiatric: Affect and judgment normal.  Breast exam:  Chaperone present.  Right mastectomy.  Left breast shows no palpable dominant masses.    Labs No visits with results within 3 Day(s) from this visit.  Latest known visit with results is:  Hospital Outpatient Visit on 03/14/2018  Component Date Value Ref Range Status  . WBC 03/14/2018 6.6  4.0 - 10.5 K/uL Final  . RBC 03/14/2018 4.09  3.87 - 5.11 MIL/uL Final  . Hemoglobin 03/14/2018 13.1  12.0 - 15.0 g/dL Final  . HCT 03/14/2018 38.9  36.0 - 46.0 % Final  . MCV 03/14/2018 95.1  78.0 - 100.0 fL Final  . MCH 03/14/2018 32.0  26.0 - 34.0 pg Final  . MCHC 03/14/2018 33.7  30.0 - 36.0 g/dL Final  . RDW 03/14/2018 13.1  11.5 - 15.5 % Final  . Platelets 03/14/2018 272  150 - 400 K/uL Final   Performed at [facility], [address], [place], [place] [address]  . Sodium 03/14/2018 135  135 - 145 mmol/L Final  . Potassium 03/14/2018 3.3* 3.5 - 5.1 mmol/L Final  . Chloride 03/14/2018 99* 101 - 111 mmol/L Final  . CO2 03/14/2018 28  22 - 32 mmol/L Final  . Glucose, Bld 03/14/2018 107* 65 - 99 mg/dL Final  .  BUN 03/14/2018 8  6 - 20 mg/dL Final  . Creatinine, Ser 03/14/2018 0.72  0.44 - 1.00 mg/dL Final  . Calcium 03/14/2018 8.7* 8.9 - 10.3 mg/dL Final  . GFR calc non Af Amer 03/14/2018 >60  >60 mL/min Final  . GFR calc Af Amer 03/14/2018 >60  >60 mL/min Final   Comment: (NOTE) The eGFR has been calculated using the CKD EPI equation. This calculation has not been validated in all clinical situations. eGFR's persistently <60 mL/min signify possible Chronic  Kidney Disease.   Georgiann Hahn gap 03/14/2018 8  5 - 15 Final   Performed at [facility], [address] [place], [place] [address]     Pathology Orders Placed This Encounter  Procedures  . CBC with Differential/Platelet    Standing Status:   Future    Standing Expiration Date:   09/27/2019  . Comprehensive metabolic panel    Standing Status:   Future    Standing Expiration Date:   09/27/2019  . Lactate dehydrogenase    Standing Status:   Future    Standing Expiration Date:   09/27/2019  . Ferritin    Standing Status:   Future    Standing Expiration Date:   09/27/2019  . T4 AND TSH    Standing Status:   Future    Standing Expiration Date:   09/27/2019       Zoila Shutter MD

## 2018-09-26 NOTE — Patient Instructions (Signed)
[facility] at [facility]  Discharge Instructions: You saw Dr. Higgs today                               _______________________________________________________________  Thank you for choosing [facility] at [facility] to provide your oncology and hematology care.  To afford each patient quality time with our providers, please arrive at least 15 minutes before your scheduled appointment.  You need to re-schedule your appointment if you arrive 10 or more minutes late.  We strive to give you quality time with our providers, and arriving late affects you and other patients whose appointments are after yours.  Also, if you no show three or more times for appointments you may be dismissed from the clinic.  Again, thank you for choosing [facility] at [facility]. Our hope is that these requests will allow you access to exceptional care and in a timely manner. _______________________________________________________________  If you have questions after your visit, please contact our office at [phone] between the hours of 8:30 a.m. and 5:00 p.m. Voicemails left after 4:30 p.m. will not be returned until the following business day. _______________________________________________________________  For prescription refill requests, have your pharmacy contact our office. _______________________________________________________________  Recommendations made by the consultant and any test results will be sent to your referring physician. _______________________________________________________________ 

## 2018-09-27 ENCOUNTER — Ambulatory Visit (HOSPITAL_COMMUNITY): Payer: Medicare Other | Admitting: Hematology

## 2018-10-05 ENCOUNTER — Other Ambulatory Visit (HOSPITAL_COMMUNITY): Payer: Self-pay | Admitting: Nurse Practitioner

## 2018-10-05 DIAGNOSIS — F329 Major depressive disorder, single episode, unspecified: Secondary | ICD-10-CM

## 2018-10-05 DIAGNOSIS — F419 Anxiety disorder, unspecified: Principal | ICD-10-CM

## 2018-10-05 DIAGNOSIS — F32A Depression, unspecified: Secondary | ICD-10-CM

## 2018-10-06 ENCOUNTER — Other Ambulatory Visit: Payer: Self-pay | Admitting: Orthopedic Surgery

## 2018-10-06 ENCOUNTER — Other Ambulatory Visit: Payer: Self-pay | Admitting: Radiology

## 2018-10-06 MED ORDER — GABAPENTIN 600 MG PO TABS: ORAL_TABLET | ORAL | 5 refills | Status: DC

## 2018-10-06 NOTE — Telephone Encounter (Signed)
GABAPENTIN 600 MGS. REFILLS: 5    QTY 135    Sig: TAKE 1/2 TABLET BY MOUTH DURING THE DAY AND 1 TABLET AT BEDTIME  Pharmacy:  [facility], [facility]

## 2018-10-18 DIAGNOSIS — H8110 Benign paroxysmal vertigo, unspecified ear: Secondary | ICD-10-CM | POA: Diagnosis not present

## 2018-10-18 DIAGNOSIS — I10 Essential (primary) hypertension: Secondary | ICD-10-CM | POA: Diagnosis not present

## 2018-10-18 DIAGNOSIS — C641 Malignant neoplasm of right kidney, except renal pelvis: Secondary | ICD-10-CM | POA: Diagnosis not present

## 2018-10-25 DIAGNOSIS — C641 Malignant neoplasm of right kidney, except renal pelvis: Secondary | ICD-10-CM | POA: Diagnosis not present

## 2018-10-25 DIAGNOSIS — Z85528 Personal history of other malignant neoplasm of kidney: Secondary | ICD-10-CM | POA: Diagnosis not present

## 2018-11-28 DIAGNOSIS — C50911 Malignant neoplasm of unspecified site of right female breast: Secondary | ICD-10-CM | POA: Diagnosis not present

## 2018-12-05 DIAGNOSIS — C641 Malignant neoplasm of right kidney, except renal pelvis: Secondary | ICD-10-CM | POA: Diagnosis not present

## 2018-12-05 DIAGNOSIS — Q625 Duplication of ureter: Secondary | ICD-10-CM | POA: Diagnosis not present

## 2018-12-05 DIAGNOSIS — R3915 Urgency of urination: Secondary | ICD-10-CM | POA: Diagnosis not present

## 2018-12-26 DIAGNOSIS — C50911 Malignant neoplasm of unspecified site of right female breast: Secondary | ICD-10-CM | POA: Diagnosis not present

## 2019-01-01 ENCOUNTER — Other Ambulatory Visit (HOSPITAL_COMMUNITY): Payer: Self-pay | Admitting: Nurse Practitioner

## 2019-01-01 DIAGNOSIS — F419 Anxiety disorder, unspecified: Principal | ICD-10-CM

## 2019-01-01 DIAGNOSIS — F329 Major depressive disorder, single episode, unspecified: Secondary | ICD-10-CM

## 2019-01-01 DIAGNOSIS — F32A Depression, unspecified: Secondary | ICD-10-CM

## 2019-01-07 DIAGNOSIS — E039 Hypothyroidism, unspecified: Secondary | ICD-10-CM | POA: Diagnosis not present

## 2019-01-07 DIAGNOSIS — I10 Essential (primary) hypertension: Secondary | ICD-10-CM | POA: Diagnosis not present

## 2019-01-09 DIAGNOSIS — I10 Essential (primary) hypertension: Secondary | ICD-10-CM | POA: Diagnosis not present

## 2019-01-09 DIAGNOSIS — M25512 Pain in left shoulder: Secondary | ICD-10-CM | POA: Diagnosis not present

## 2019-01-09 DIAGNOSIS — E039 Hypothyroidism, unspecified: Secondary | ICD-10-CM | POA: Diagnosis not present

## 2019-02-27 ENCOUNTER — Other Ambulatory Visit (HOSPITAL_COMMUNITY): Payer: Self-pay | Admitting: *Deleted

## 2019-02-27 DIAGNOSIS — D0511 Intraductal carcinoma in situ of right breast: Secondary | ICD-10-CM

## 2019-02-27 DIAGNOSIS — C649 Malignant neoplasm of unspecified kidney, except renal pelvis: Secondary | ICD-10-CM

## 2019-02-27 DIAGNOSIS — C50411 Malignant neoplasm of upper-outer quadrant of right female breast: Secondary | ICD-10-CM

## 2019-02-28 ENCOUNTER — Other Ambulatory Visit: Payer: Self-pay

## 2019-02-28 ENCOUNTER — Inpatient Hospital Stay (HOSPITAL_COMMUNITY): Payer: Medicare Other | Attending: Hematology

## 2019-02-28 DIAGNOSIS — Z87891 Personal history of nicotine dependence: Secondary | ICD-10-CM | POA: Diagnosis not present

## 2019-02-28 DIAGNOSIS — Z9011 Acquired absence of right breast and nipple: Secondary | ICD-10-CM | POA: Diagnosis not present

## 2019-02-28 DIAGNOSIS — R0602 Shortness of breath: Secondary | ICD-10-CM | POA: Diagnosis not present

## 2019-02-28 DIAGNOSIS — K5900 Constipation, unspecified: Secondary | ICD-10-CM | POA: Insufficient documentation

## 2019-02-28 DIAGNOSIS — Z8261 Family history of arthritis: Secondary | ICD-10-CM | POA: Diagnosis not present

## 2019-02-28 DIAGNOSIS — R42 Dizziness and giddiness: Secondary | ICD-10-CM | POA: Diagnosis not present

## 2019-02-28 DIAGNOSIS — I972 Postmastectomy lymphedema syndrome: Secondary | ICD-10-CM | POA: Diagnosis not present

## 2019-02-28 DIAGNOSIS — Z905 Acquired absence of kidney: Secondary | ICD-10-CM | POA: Insufficient documentation

## 2019-02-28 DIAGNOSIS — Z8249 Family history of ischemic heart disease and other diseases of the circulatory system: Secondary | ICD-10-CM | POA: Diagnosis not present

## 2019-02-28 DIAGNOSIS — D0511 Intraductal carcinoma in situ of right breast: Secondary | ICD-10-CM | POA: Diagnosis not present

## 2019-02-28 DIAGNOSIS — C50411 Malignant neoplasm of upper-outer quadrant of right female breast: Secondary | ICD-10-CM

## 2019-02-28 DIAGNOSIS — R51 Headache: Secondary | ICD-10-CM | POA: Diagnosis not present

## 2019-02-28 DIAGNOSIS — Z801 Family history of malignant neoplasm of trachea, bronchus and lung: Secondary | ICD-10-CM | POA: Insufficient documentation

## 2019-02-28 DIAGNOSIS — Z7289 Other problems related to lifestyle: Secondary | ICD-10-CM | POA: Diagnosis not present

## 2019-02-28 DIAGNOSIS — Z79899 Other long term (current) drug therapy: Secondary | ICD-10-CM | POA: Insufficient documentation

## 2019-02-28 DIAGNOSIS — C649 Malignant neoplasm of unspecified kidney, except renal pelvis: Secondary | ICD-10-CM

## 2019-02-28 DIAGNOSIS — Z823 Family history of stroke: Secondary | ICD-10-CM | POA: Diagnosis not present

## 2019-02-28 DIAGNOSIS — C641 Malignant neoplasm of right kidney, except renal pelvis: Secondary | ICD-10-CM | POA: Insufficient documentation

## 2019-02-28 DIAGNOSIS — C50412 Malignant neoplasm of upper-outer quadrant of left female breast: Secondary | ICD-10-CM

## 2019-02-28 LAB — CBC WITH DIFFERENTIAL/PLATELET
Abs Immature Granulocytes: 0.01 10*3/uL (ref 0.00–0.07)
Basophils Absolute: 0.1 10*3/uL (ref 0.0–0.1)
Basophils Relative: 1 %
Eosinophils Absolute: 0.1 10*3/uL (ref 0.0–0.5)
Eosinophils Relative: 1 %
HCT: 39.3 % (ref 36.0–46.0)
Hemoglobin: 12.9 g/dL (ref 12.0–15.0)
Immature Granulocytes: 0 %
Lymphocytes Relative: 40 %
Lymphs Abs: 2.7 10*3/uL (ref 0.7–4.0)
MCH: 31.6 pg (ref 26.0–34.0)
MCHC: 32.8 g/dL (ref 30.0–36.0)
MCV: 96.3 fL (ref 80.0–100.0)
Monocytes Absolute: 0.5 10*3/uL (ref 0.1–1.0)
Monocytes Relative: 8 %
Neutro Abs: 3.3 10*3/uL (ref 1.7–7.7)
Neutrophils Relative %: 50 %
Platelets: 218 10*3/uL (ref 150–400)
RBC: 4.08 MIL/uL (ref 3.87–5.11)
RDW: 13 % (ref 11.5–15.5)
WBC: 6.7 10*3/uL (ref 4.0–10.5)
nRBC: 0 % (ref 0.0–0.2)

## 2019-02-28 LAB — COMPREHENSIVE METABOLIC PANEL
ALT: 29 U/L (ref 0–44)
AST: 38 U/L (ref 15–41)
Albumin: 3.6 g/dL (ref 3.5–5.0)
Alkaline Phosphatase: 49 U/L (ref 38–126)
Anion gap: 10 (ref 5–15)
BUN: 8 mg/dL (ref 8–23)
CO2: 24 mmol/L (ref 22–32)
Calcium: 8.7 mg/dL — ABNORMAL LOW (ref 8.9–10.3)
Chloride: 104 mmol/L (ref 98–111)
Creatinine, Ser: 0.78 mg/dL (ref 0.44–1.00)
GFR calc Af Amer: >60 mL/min (ref >60)
GFR calc non Af Amer: >60 mL/min (ref >60)
Glucose, Bld: 95 mg/dL (ref 70–99)
Potassium: 3.6 mmol/L (ref 3.5–5.1)
Sodium: 138 mmol/L (ref 135–145)
Total Bilirubin: 0.7 mg/dL (ref 0.3–1.2)
Total Protein: 7.2 g/dL (ref 6.5–8.1)

## 2019-02-28 LAB — LACTATE DEHYDROGENASE: LDH: 153 U/L (ref 98–192)

## 2019-02-28 LAB — FERRITIN: Ferritin: 30 ng/mL (ref 11–307)

## 2019-03-02 ENCOUNTER — Encounter (HOSPITAL_COMMUNITY): Payer: Self-pay | Admitting: Hematology

## 2019-03-02 ENCOUNTER — Other Ambulatory Visit: Payer: Self-pay

## 2019-03-02 ENCOUNTER — Inpatient Hospital Stay (HOSPITAL_BASED_OUTPATIENT_CLINIC_OR_DEPARTMENT_OTHER): Payer: Medicare Other | Admitting: Hematology

## 2019-03-02 VITALS — BP 136/70 | HR 77 | Temp 98.9°F | Resp 18 | Wt 200.0 lb

## 2019-03-02 DIAGNOSIS — D0511 Intraductal carcinoma in situ of right breast: Secondary | ICD-10-CM | POA: Diagnosis not present

## 2019-03-02 DIAGNOSIS — Z87891 Personal history of nicotine dependence: Secondary | ICD-10-CM

## 2019-03-02 DIAGNOSIS — Z8261 Family history of arthritis: Secondary | ICD-10-CM

## 2019-03-02 DIAGNOSIS — Z8249 Family history of ischemic heart disease and other diseases of the circulatory system: Secondary | ICD-10-CM

## 2019-03-02 DIAGNOSIS — I972 Postmastectomy lymphedema syndrome: Secondary | ICD-10-CM | POA: Diagnosis not present

## 2019-03-02 DIAGNOSIS — C50412 Malignant neoplasm of upper-outer quadrant of left female breast: Secondary | ICD-10-CM

## 2019-03-02 DIAGNOSIS — Z905 Acquired absence of kidney: Secondary | ICD-10-CM

## 2019-03-02 DIAGNOSIS — Z79899 Other long term (current) drug therapy: Secondary | ICD-10-CM

## 2019-03-02 DIAGNOSIS — Z823 Family history of stroke: Secondary | ICD-10-CM

## 2019-03-02 DIAGNOSIS — R0602 Shortness of breath: Secondary | ICD-10-CM | POA: Diagnosis not present

## 2019-03-02 DIAGNOSIS — Z9011 Acquired absence of right breast and nipple: Secondary | ICD-10-CM | POA: Diagnosis not present

## 2019-03-02 DIAGNOSIS — Z13820 Encounter for screening for osteoporosis: Secondary | ICD-10-CM

## 2019-03-02 DIAGNOSIS — C641 Malignant neoplasm of right kidney, except renal pelvis: Secondary | ICD-10-CM

## 2019-03-02 DIAGNOSIS — R42 Dizziness and giddiness: Secondary | ICD-10-CM

## 2019-03-02 DIAGNOSIS — Z801 Family history of malignant neoplasm of trachea, bronchus and lung: Secondary | ICD-10-CM | POA: Diagnosis not present

## 2019-03-02 DIAGNOSIS — K5900 Constipation, unspecified: Secondary | ICD-10-CM | POA: Diagnosis not present

## 2019-03-02 DIAGNOSIS — Z7289 Other problems related to lifestyle: Secondary | ICD-10-CM

## 2019-03-02 DIAGNOSIS — R51 Headache: Secondary | ICD-10-CM | POA: Diagnosis not present

## 2019-03-02 DIAGNOSIS — C50411 Malignant neoplasm of upper-outer quadrant of right female breast: Secondary | ICD-10-CM

## 2019-03-02 NOTE — Patient Instructions (Addendum)
[facility] at [facility] Discharge Instructions  You were seen today by Dr. Katragadda. He went over your recent lab results. He will see you back in 6 months for labs and follow up.   Thank you for choosing [facility] at [facility] to provide your oncology and hematology care.  To afford each patient quality time with our provider, please arrive at least 15 minutes before your scheduled appointment time.   If you have a lab appointment with the Cancer Center please come in thru the  Main Entrance and check in at the main information desk  You need to re-schedule your appointment should you arrive 10 or more minutes late.  We strive to give you quality time with our providers, and arriving late affects you and other patients whose appointments are after yours.  Also, if you no show three or more times for appointments you may be dismissed from the clinic at the providers discretion.     Again, thank you for choosing [facility].  Our hope is that these requests will decrease the amount of time that you wait before being seen by our physicians.       _____________________________________________________________  Should you have questions after your visit to [facility], please contact our office at [phone] between the hours of 8:00 a.m. and 4:30 p.m.  Voicemails left after 4:00 p.m. will not be returned until the following business day.  For prescription refill requests, have your pharmacy contact our office and allow 72 hours.    Cancer Center Support Programs:   > Cancer Support Group  2nd Tuesday of the month 1pm-2pm, Journey Room    

## 2019-03-02 NOTE — Assessment & Plan Note (Addendum)
1.  Right breast DCIS/LCIS: - Status post right simple mastectomy on 11/20/2014, lymph node biopsy negative.  Grade 2, 10 cm.  ER/PR positive. - Tamoxifen started on 01/01/2015.  She is tolerating it very well.  She will continue for 5 years. - Left breast mammogram on 09/16/2018, benign. - Right mastectomy site is within normal limits.  She has a flap on the right lateral chest wall which has some lymphedema.  Left breast has no palpable masses.  We have reviewed her blood work which is within normal limits. - We will schedule her next visit in 6 months.  We will also schedule her for her next mammogram.  2.  Right renal cell carcinoma: -She had a T1 a NX RCC which was treated with partial nephrectomy. -She follows up with Dr. Tresa Moore.  Last CT scan on 10/01/2017 showed shows no recurrence.  Hepatic steatosis was seen. - She reports that she had another CT scan in January of this year.  It was reportedly normal and she was released by Dr. Tresa Moore.

## 2019-03-02 NOTE — Progress Notes (Signed)
[place], [place] [address]   CLINIC:  Medical Oncology/Hematology  PCP:  [name], [address] [place] [id number] [phone]   REASON FOR VISIT:  Follow-up for DCIS.   BRIEF ONCOLOGIC HISTORY:  Oncology History  Malignant neoplasm of upper outer quadrant of female breast ([place])  09/05/2014 Imaging   assymetry noted in R breast on screening mammogram   09/18/2014 Imaging   Irregular mass in the 12 o'clock location of the right breast. Tissue diagnosis is recommended.   09/18/2014 Imaging   IMPRESSION: Irregular mass in the 12 o'clock location of the right breast. Tissue diagnosis is recommended.   10/09/2014 Imaging   8 x 12 x 8 CM abnormal linear clumped enhancement throughout the majority of the central and upper right breast.   10/17/2014 Initial Biopsy   Breast, right, needle core biopsy, central - DUCTAL CARCINOMA IN SITU, MIXED PATTERNS (CRIBRIFORM, COMEDO AND PAPILLARY), INVOLVING MULTIPLE CORES, AT LEAST 9 MM IN MAXIMAL EXTENT IN ANY ONE CORE. - FOCAL LOBULAR NEOPLASIA (ALH/LCIS). - NO INVASIVE CAR   11/20/2014 Definitive Surgery   Breast, simple mastectomy, right - DUCTAL CARCINOMA IN SITU, SEE COMMENT. - LOBULAR CARCINOMA IN SITU (CONVENTIONAL AND PLEOMORPHIC TYPES). - PREVIOUS BIOPSY SITES (X2). - IN SITU CARCINOMA IS 3CM FROM NEAREST MARGIN (DEEP).   01/01/2015 -  Anti-estrogen oral therapy   Tamoxifen daily.   07/21/2016 Breast US   BIRADS 4   07/29/2016 Procedure   Ultrasound guided biopsy of right axillary mass/lymph node. No apparent complications.   07/30/2016 Pathology Results   Pathology of the right axillary lymph node biopsy revealed LYMPHOID TISSUE WITH DERMATOPATHIC CHANGE. NO METASTATIC CARCINOMA IDENTIFIED.    Renal cell carcinoma ([place])  01/08/2016 Procedure   Robotic-assisted laparoscopic right partial nephrectomy by Dr. Tresa Moore.   01/08/2016 Pathology Results   Kidney, wedge excision /  partial resection, right renal mass RENAL CELL CARCINOMA, CLEAR CELL TYPE, WHO NUCLEAR GRADE 2 (2.4 CM) THE TUMOR IS CONFINED TO THE KIDNEY MARGINS OF RESECTION ARE NEGATIVE FOR TUMOR      CANCER STAGING: Cancer Staging Malignant neoplasm of upper outer quadrant of female breast ([place]) Staging form: Breast, AJCC 7th Edition - Clinical: Stage 0 (Tis (DCIS), N0, M0) - Signed by [name], MD on 10/11/2014  Renal cell carcinoma [facility]) Staging form: Kidney, AJCC 7th Edition - Clinical stage from 06/02/2016: Stage Unknown (T1a, NX, M0) - Signed by [name] Cancer, PA-C on 06/02/2016    INTERVAL HISTORY:  Ms. Morocco 73 y.o. female for follow-up of DCIS of the right breast.  She is tolerating tamoxifen very well.  She denies any new onset pains.  Last mammogram was in December 2019.  She has constipation at times.  She also has shortness of breath on exertion at times.  Occasional dizziness was reported.  No hospitalizations or ER visits.  Denies any fevers, night sweats or weight loss.    REVIEW OF SYSTEMS:  Review of Systems  Respiratory: Positive for shortness of breath.   Gastrointestinal: Positive for constipation.  Neurological: Positive for headaches.  All other systems reviewed and are negative.    PAST MEDICAL/SURGICAL HISTORY:  Past Medical History:  Diagnosis Date  . Allergic rhinitis due to pollen   . Anxiety   . Arthritis    "knees, back, right elbow; left shoulder; right ankle" (11/20/2014)  . Breast cancer, right breast ([place])    "DCIS; zero stage" S/P mastectomy 11/20/2014  .  Cervical spondylosis without myelopathy    all over  . Cervicalgia   . Chronic lower back pain   . Constipation    takes Miralax daily  . Degeneration of cervical intervertebral disc   . Depression   . Diverticulosis of colon (without mention of hemorrhage)   . GERD (gastroesophageal reflux disease)    takes Omeprazole daily  . Graves' disease    "took a pill to correct"  . H/O  hiatal hernia   . H/O urinary frequency   . Headache(784.0)    denies migraines since the 80's but has occ and takes Butalbital prn  . HTN (hypertension)    takes Metoprolol and Lisinopril daily  . Hypertension   . Insomnia    takes Ativan nigtly  . Joint pain    "all of them"  . Joint swelling    "knees, legs, ankles sometimes" (11/20/2014)  . Morbid obesity ([place])   . Numbness    LOWER LEGS  . Other postablative hypothyroidism    takes SYnthroid daily  . Palpitations   . Peptic ulcer, unspecified site, unspecified as acute or chronic, without mention of hemorrhage, perforation, or obstruction   . Primary localized osteoarthrosis, lower leg   . Pure hypercholesterolemia    takes Pravastin daily  . Recurrent UTI   . Renal cell carcinoma ([place]) 01/08/2016  . Renal mass    RIGHT  . Shortness of breath   . Sleep apnea    slight but doesn't require a CPAP (11/20/2014)  . Tendonitis of knee    bilateral   Past Surgical History:  Procedure Laterality Date  . ABDOMINAL HYSTERECTOMY    . ANTERIOR LAT LUMBAR FUSION  05/13/2012   Procedure: ANTERIOR LATERAL LUMBAR FUSION 2 LEVELS;  Surgeon: Faythe Ghee, MD;  Location: [facility];  Service: Neurosurgery;  Laterality: Left;  Left Lumbar Three-four,Lumbar four-five Extreme Lumbar Interbody Fusion with Percutaneous Pedicle Screws  . APPENDECTOMY    . BREAST BIOPSY Left ~ 2014  . BREAST BIOPSY Right 2015  . BREAST LUMPECTOMY Left 1988  . BREAST RECONSTRUCTION WITH PLACEMENT OF TISSUE EXPANDER AND FLEX HD (ACELLULAR HYDRATED DERMIS) Right 11/20/2014  . BREAST RECONSTRUCTION WITH PLACEMENT OF TISSUE EXPANDER AND FLEX HD (ACELLULAR HYDRATED DERMIS) Right 11/20/2014   Procedure: RIGHT BREAST RECONSTRUCTION WITH PLACEMENT OF TISSUE EXPANDER AND ACELLULAR DERMA MATRIX (ACELLULAR DERMA MATRIX);  Surgeon: Crissie Reese, MD;  Location: [facility];  Service: Plastics;  Laterality: Right;  . CATARACT EXTRACTION W/ INTRAOCULAR LENS  IMPLANT, BILATERAL  Bilateral   . COLONOSCOPY  Sept 2008   RMR: normal rectum, left-sided diverticula, repeat in 2018  . COLONOSCOPY WITH PROPOFOL N/A 03/21/2018   Dr. Gala Romney: diverticulosis, internal grade I hemorrhoids. no future colonoscopies unless develops new symptoms  . DIAGNOSTIC LAPAROSCOPY    . DILATION AND CURETTAGE OF UTERUS    . ESOPHAGOGASTRODUODENOSCOPY    . ESOPHAGOGASTRODUODENOSCOPY  2013   Dr. Gala Romney: Cervical esophageal web and Schatzki's ring s/p dilation, small hiatal hernia, antral and duodenal erosions likely NSAID effect, chronic duodenitis on path   . ESOPHAGOGASTRODUODENOSCOPY (EGD) WITH PROPOFOL N/A 03/21/2018   Dr. Gala Romney: mild Schatzki ring s/p dilation  . FOREIGN BODY REMOVAL Right 10/06/2013   Procedure: REMOVAL FOREIGN BODY EXTREMITY;  Surgeon: Carole Civil, MD;  Location: [facility];  Service: Orthopedics;  Laterality: Right;  . INJECTION KNEE Left 10/06/2013   Procedure: KNEE INJECTION;  Surgeon: Carole Civil, MD;  Location: [facility];  Service: Orthopedics;  Laterality: Left;  .  JOINT REPLACEMENT Bilateral    knees  . KNEE ARTHROSCOPY Right   . KNEE ARTHROSCOPY WITH LATERAL MENISECTOMY Right 10/06/2013   Procedure: KNEE ARTHROSCOPY WITH LATERAL AND MEDIAL MENISECTOMY;  Surgeon: Carole Civil, MD;  Location: [facility];  Service: Orthopedics;  Laterality: Right;  END @ 1234  . lumpectomy on pelvis     "mass of nerves"  . MALONEY DILATION N/A 03/21/2018   Procedure: Venia Minks DILATION;  Surgeon: Daneil Dolin, MD;  [facility];  Service: Endoscopy;  Laterality: N/A;  . MASTECTOMY COMPLETE / SIMPLE W/ SENTINEL NODE BIOPSY Right 11/20/2014   axillary  . REMOVAL OF TISSUE EXPANDER AND PLACEMENT OF IMPLANT Right 12/13/2014   Procedure: REMOVAL OF RIGHT BREAST TISSUE EXPANDER;  Surgeon: Crissie Reese, MD;  Location: [facility];  Service: Plastics;  Laterality: Right;  . ROBOTIC ASSITED PARTIAL NEPHRECTOMY Right 01/08/2016   Procedure: XI ROBOTIC ASSITED PARTIAL NEPHRECTOMY;   Surgeon: Alexis Frock, MD;  Location: [facility];  Service: Urology;  Laterality: Right;  . SIMPLE MASTECTOMY WITH AXILLARY SENTINEL NODE BIOPSY Right 11/20/2014   Procedure: SIMPLE MASTECTOMY WITH AXILLARY SENTINEL NODE BIOPSY;  Surgeon: Erroll Luna, MD;  Location: [facility];  Service: General;  Laterality: Right;  . TISSUE EXPANDER REMOVAL Right 12/13/2014   dr Harlow Mares  . TONSILLECTOMY    . TOTAL KNEE ARTHROPLASTY Right 01/24/2014   Procedure: TOTAL KNEE ARTHROPLASTY;  Surgeon: Carole Civil, MD;  Location: [facility];  Service: Orthopedics;  Laterality: Right;  . TOTAL KNEE ARTHROPLASTY Left 08/08/2014   Procedure: TOTAL KNEE ARTHROPLASTY;  Surgeon: Carole Civil, MD;  Location: [facility];  Service: Orthopedics;  Laterality: Left;  . TUBAL LIGATION    . UPPER GASTROINTESTINAL ENDOSCOPY       SOCIAL HISTORY:  Social History   Socioeconomic History  . Marital status: Married    Spouse name: Milbert Coulter  . Number of children: 4  . Years of education: Not on file  . Highest education level: Not on file  Occupational History  . Occupation: disability    Employer: UNEMPLOYED    Comment: since 2008, knee surgery  Social Needs  . Financial resource strain: Not on file  . Food insecurity    Worry: Not on file    Inability: Not on file  . Transportation needs    Medical: Not on file    Non-medical: Not on file  Tobacco Use  . Smoking status: Former Smoker    Packs/day: 1.00    Years: 27.00    Pack years: 27.00    Types: Cigarettes    Quit date: 12/26/1985    Years since quitting: 33.2  . Smokeless tobacco: Never Used  . Tobacco comment: Quit in 1887  Substance and Sexual Activity  . Alcohol use: Yes    Alcohol/week: 0.0 standard drinks    Comment: occasional wine  . Drug use: No  . Sexual activity: Yes    Birth control/protection: Surgical  Lifestyle  . Physical activity    Days per week: Not on file    Minutes per session: Not on file  . Stress: Not on file  Relationships  .  Social Herbalist on phone: Not on file    Gets together: Not on file    Attends religious service: Not on file    Active member of club or organization: Not on file    Attends meetings of clubs or organizations: Not on file    Relationship status: Not on  file  . Intimate partner violence    Fear of current or ex partner: Not on file    Emotionally abused: Not on file    Physically abused: Not on file    Forced sexual activity: Not on file  Other Topics Concern  . Not on file  Social History Narrative   Currently married for 10 years. Husband's name is Milbert Coulter. She is an excellent singer. 8 children between she and her husband. 23 grand children. 12 great grandchildren.    FAMILY HISTORY:  Family History  Problem Relation Age of Onset  . Stroke Sister   . Osteoarthritis Sister   . Osteoarthritis Brother   . Obesity Brother   . Heart attack Sister   . Heart attack Father   . Lung cancer Maternal Grandmother        non-smoker  . Heart attack Unknown        2 Brothers, 3 sisters/Father died in 44's from old age  . Colon cancer Neg Hx        Mother died at 23 from blood clot, MI    CURRENT MEDICATIONS:  Outpatient Encounter Medications as of 03/02/2019  Medication Sig  . acetaminophen (TYLENOL) 500 MG tablet Take 650 mg by mouth See admin instructions. Pt takes 650 mg in the morning and 650 mg at night  . diphenhydrAMINE (BENADRYL) 50 MG capsule Take 50 mg by mouth at bedtime.  . gabapentin (NEURONTIN) 600 MG tablet TAKE 1 DURING THE DAY AND 1 AT BEDTIME  . hydrochlorothiazide (HYDRODIURIL) 25 MG tablet Take 25 mg by mouth daily.   Marland Kitchen levothyroxine (SYNTHROID, LEVOTHROID) 88 MCG tablet Take 88 mcg by mouth daily before breakfast.   . omeprazole (PRILOSEC) 20 MG capsule Take 20 mg by mouth daily. May take a second 20 mg dose as needed for heartburn  . oxybutynin (DITROPAN) 5 MG tablet Take 5 mg by mouth 2 (two) times daily.   . pravastatin (PRAVACHOL) 40 MG tablet Take 40  mg by mouth every morning.   . sertraline (ZOLOFT) 50 MG tablet TAKE 1 TABLET BY MOUTH  DAILY  . tamoxifen (NOLVADEX) 20 MG tablet Take 1 tablet (20 mg total) by mouth daily.  . fexofenadine (ALLEGRA) 180 MG tablet Take 180 mg by mouth daily.  . [DISCONTINUED] Ranitidine HCl (ZANTAC 75 PO) Take by mouth.     No facility-administered encounter medications on file as of 03/02/2019.     ALLERGIES:  Allergies  Allergen Reactions  . Propoxyphene Hcl Other (See Comments)     Hallucinations  . Pollen Extract Other (See Comments)    Sneezing, congestion  . Camphor Itching  . Dust Mite Extract Itching    Sneezing. Runny nose   . Latex Dermatitis and Rash  . Molds & Smuts Itching  . Tomato Hives     PHYSICAL EXAM:  ECOG Performance status: 1  Vitals:   03/02/19 1138  BP: 136/70  Pulse: 77  Resp: 18  Temp: 98.9 F (37.2 C)  SpO2: 98%   Filed Weights   03/02/19 1138  Weight: 200 lb (90.7 kg)    Physical Exam Vitals signs reviewed.  Constitutional:      Appearance: Normal appearance.  Cardiovascular:     Rate and Rhythm: Normal rate and regular rhythm.     Heart sounds: Normal heart sounds.  Pulmonary:     Effort: Pulmonary effort is normal.     Breath sounds: Normal breath sounds.  Abdominal:     General: There  is no distension.     Palpations: Abdomen is soft. There is no mass.  Musculoskeletal:        General: No swelling.  Skin:    General: Skin is warm.  Neurological:     General: No focal deficit present.     Mental Status: She is alert and oriented to person, place, and time.  Psychiatric:        Mood and Affect: Mood normal.        Behavior: Behavior normal.    Right mastectomy site is within normal limits.  There is a skin flap in the right lateral chest wall with lymphedema.  Left breast has no palpable masses.  LABORATORY DATA:  I have reviewed the labs as listed.  CBC    Component Value Date/Time   WBC 6.7 02/28/2019 1128   RBC 4.08 02/28/2019  1128   HGB 12.9 02/28/2019 1128   HCT 39.3 02/28/2019 1128   PLT 218 02/28/2019 1128   MCV 96.3 02/28/2019 1128   MCH 31.6 02/28/2019 1128   MCHC 32.8 02/28/2019 1128   RDW 13.0 02/28/2019 1128   LYMPHSABS 2.7 02/28/2019 1128   MONOABS 0.5 02/28/2019 1128   EOSABS 0.1 02/28/2019 1128   BASOSABS 0.1 02/28/2019 1128   CMP Latest Ref Rng & Units 02/28/2019 03/14/2018 11/23/2017  Glucose 70 - 99 mg/dL 95 107(H) 108(H)  BUN 8 - 23 mg/dL 8 8 8   Creatinine 0.44 - 1.00 mg/dL 0.78 0.72 0.85  Sodium 135 - 145 mmol/L 138 135 136  Potassium 3.5 - 5.1 mmol/L 3.6 3.3(L) 3.4(L)  Chloride 98 - 111 mmol/L 104 99(L) 99(L)  CO2 22 - 32 mmol/L 24 28 25   Calcium 8.9 - 10.3 mg/dL 8.7(L) 8.7(L) 8.9  Total Protein 6.5 - 8.1 g/dL 7.2 - 7.3  Total Bilirubin 0.3 - 1.2 mg/dL 0.7 - 0.5  Alkaline Phos 38 - 126 U/L 49 - 47  AST 15 - 41 U/L 38 - 45(H)  ALT 0 - 44 U/L 29 - 31       DIAGNOSTIC IMAGING:  I have independently reviewed the scans and discussed with the patient.   I have reviewed Venita Lick LPN's note and agree with the documentation.  I personally performed a face-to-face visit, made revisions and my assessment and plan is as follows.    ASSESSMENT & PLAN:   Malignant neoplasm of upper outer quadrant of female breast 1.  Right breast DCIS/LCIS: - Status post right simple mastectomy on 11/20/2014, lymph node biopsy negative.  Grade 2, 10 cm.  ER/PR positive. - Tamoxifen started on 01/01/2015.  She is tolerating it very well.  She will continue for 5 years. - Left breast mammogram on 09/16/2018, benign. - Right mastectomy site is within normal limits.  She has a flap on the right lateral chest wall which has some lymphedema.  Left breast has no palpable masses.  We have reviewed her blood work which is within normal limits. - We will schedule her next visit in 6 months.  We will also schedule her for her next mammogram.  2.  Right renal cell carcinoma: -She had a T1 a NX RCC which was treated  with partial nephrectomy. -She follows up with Dr. Tresa Moore.  Last CT scan on 10/01/2017 showed shows no recurrence.  Hepatic steatosis was seen. - She reports that she had another CT scan in January of this year.  It was reportedly normal and she was released by Dr. Tresa Moore.  Orders placed this encounter:  Orders Placed This Encounter  Procedures  . DG Bone Density  . MM 3D SCREEN BREAST BILATERAL  . CBC with Differential/Platelet  . Comprehensive metabolic panel  . Vitamin D 25 hydroxy      Derek Jack, MD [place] [phone]

## 2019-03-17 ENCOUNTER — Other Ambulatory Visit: Payer: Medicare Other

## 2019-03-17 ENCOUNTER — Other Ambulatory Visit: Payer: Self-pay | Admitting: Internal Medicine

## 2019-03-17 DIAGNOSIS — R6889 Other general symptoms and signs: Secondary | ICD-10-CM | POA: Diagnosis not present

## 2019-03-17 DIAGNOSIS — Z20822 Contact with and (suspected) exposure to covid-19: Secondary | ICD-10-CM

## 2019-03-24 LAB — NOVEL CORONAVIRUS, NAA: SARS-CoV-2, NAA: DETECTED — AB

## 2019-03-25 ENCOUNTER — Telehealth: Payer: Self-pay

## 2019-03-25 NOTE — Telephone Encounter (Signed)
Called and LM on [place] [facility] [place].

## 2019-03-25 NOTE — Telephone Encounter (Signed)
Called and reported pt positive for Covid 19. Name DOB phone number and date of testing given to Dr Berdine Addison.

## 2019-03-27 ENCOUNTER — Other Ambulatory Visit (HOSPITAL_COMMUNITY): Payer: Self-pay | Admitting: Nurse Practitioner

## 2019-03-27 DIAGNOSIS — F419 Anxiety disorder, unspecified: Secondary | ICD-10-CM

## 2019-03-27 DIAGNOSIS — F329 Major depressive disorder, single episode, unspecified: Secondary | ICD-10-CM

## 2019-03-27 DIAGNOSIS — F32A Depression, unspecified: Secondary | ICD-10-CM

## 2019-06-06 DIAGNOSIS — N3281 Overactive bladder: Secondary | ICD-10-CM | POA: Diagnosis not present

## 2019-06-06 DIAGNOSIS — E039 Hypothyroidism, unspecified: Secondary | ICD-10-CM | POA: Diagnosis not present

## 2019-06-06 DIAGNOSIS — I10 Essential (primary) hypertension: Secondary | ICD-10-CM | POA: Diagnosis not present

## 2019-06-20 ENCOUNTER — Other Ambulatory Visit (HOSPITAL_COMMUNITY): Payer: Self-pay | Admitting: Internal Medicine

## 2019-06-21 ENCOUNTER — Other Ambulatory Visit (HOSPITAL_COMMUNITY): Payer: Self-pay | Admitting: Nurse Practitioner

## 2019-06-21 DIAGNOSIS — F329 Major depressive disorder, single episode, unspecified: Secondary | ICD-10-CM

## 2019-06-21 DIAGNOSIS — F32A Depression, unspecified: Secondary | ICD-10-CM

## 2019-06-21 DIAGNOSIS — C50911 Malignant neoplasm of unspecified site of right female breast: Secondary | ICD-10-CM | POA: Diagnosis not present

## 2019-06-21 NOTE — Telephone Encounter (Signed)
Refill request

## 2019-07-10 ENCOUNTER — Ambulatory Visit: Payer: Self-pay | Admitting: Orthopedic Surgery

## 2019-07-17 ENCOUNTER — Other Ambulatory Visit: Payer: Self-pay | Admitting: Orthopedic Surgery

## 2019-08-25 ENCOUNTER — Other Ambulatory Visit (HOSPITAL_COMMUNITY): Payer: Medicare Other

## 2019-08-31 ENCOUNTER — Other Ambulatory Visit (HOSPITAL_COMMUNITY): Payer: Medicare Other

## 2019-09-07 ENCOUNTER — Ambulatory Visit (HOSPITAL_COMMUNITY): Payer: Medicare Other | Admitting: Hematology

## 2019-09-25 ENCOUNTER — Inpatient Hospital Stay (HOSPITAL_COMMUNITY): Payer: Medicare Other | Attending: Hematology

## 2019-09-25 ENCOUNTER — Other Ambulatory Visit: Payer: Self-pay

## 2019-09-25 ENCOUNTER — Ambulatory Visit (HOSPITAL_COMMUNITY): Payer: Medicare Other

## 2019-09-25 ENCOUNTER — Other Ambulatory Visit (HOSPITAL_COMMUNITY): Payer: Medicare Other

## 2019-09-25 DIAGNOSIS — Z79810 Long term (current) use of selective estrogen receptor modulators (SERMs): Secondary | ICD-10-CM | POA: Insufficient documentation

## 2019-09-25 DIAGNOSIS — Z85528 Personal history of other malignant neoplasm of kidney: Secondary | ICD-10-CM | POA: Diagnosis not present

## 2019-09-25 DIAGNOSIS — C50411 Malignant neoplasm of upper-outer quadrant of right female breast: Secondary | ICD-10-CM | POA: Insufficient documentation

## 2019-09-25 DIAGNOSIS — Z79899 Other long term (current) drug therapy: Secondary | ICD-10-CM | POA: Insufficient documentation

## 2019-09-25 DIAGNOSIS — Z87891 Personal history of nicotine dependence: Secondary | ICD-10-CM | POA: Insufficient documentation

## 2019-09-25 DIAGNOSIS — Z170 Estrogen receptor positive status [ER+]: Secondary | ICD-10-CM | POA: Diagnosis not present

## 2019-09-25 DIAGNOSIS — I10 Essential (primary) hypertension: Secondary | ICD-10-CM | POA: Insufficient documentation

## 2019-09-25 DIAGNOSIS — D0511 Intraductal carcinoma in situ of right breast: Secondary | ICD-10-CM

## 2019-09-25 DIAGNOSIS — Z90710 Acquired absence of both cervix and uterus: Secondary | ICD-10-CM | POA: Insufficient documentation

## 2019-09-25 DIAGNOSIS — Z905 Acquired absence of kidney: Secondary | ICD-10-CM | POA: Diagnosis not present

## 2019-09-25 LAB — CBC WITH DIFFERENTIAL/PLATELET
Abs Immature Granulocytes: 0.01 10*3/uL (ref 0.00–0.07)
Basophils Absolute: 0 10*3/uL (ref 0.0–0.1)
Basophils Relative: 0 %
Eosinophils Absolute: 0.1 10*3/uL (ref 0.0–0.5)
Eosinophils Relative: 2 %
HCT: 40.3 % (ref 36.0–46.0)
Hemoglobin: 13.2 g/dL (ref 12.0–15.0)
Immature Granulocytes: 0 %
Lymphocytes Relative: 34 %
Lymphs Abs: 2.3 10*3/uL (ref 0.7–4.0)
MCH: 32.1 pg (ref 26.0–34.0)
MCHC: 32.8 g/dL (ref 30.0–36.0)
MCV: 98.1 fL (ref 80.0–100.0)
Monocytes Absolute: 0.6 10*3/uL (ref 0.1–1.0)
Monocytes Relative: 9 %
Neutro Abs: 3.7 10*3/uL (ref 1.7–7.7)
Neutrophils Relative %: 55 %
Platelets: 210 10*3/uL (ref 150–400)
RBC: 4.11 MIL/uL (ref 3.87–5.11)
RDW: 13 % (ref 11.5–15.5)
WBC: 6.7 10*3/uL (ref 4.0–10.5)
nRBC: 0 % (ref 0.0–0.2)

## 2019-09-25 LAB — COMPREHENSIVE METABOLIC PANEL
ALT: 35 U/L (ref 0–44)
AST: 44 U/L — ABNORMAL HIGH (ref 15–41)
Albumin: 3.2 g/dL — ABNORMAL LOW (ref 3.5–5.0)
Alkaline Phosphatase: 44 U/L (ref 38–126)
Anion gap: 11 (ref 5–15)
BUN: 12 mg/dL (ref 8–23)
CO2: 26 mmol/L (ref 22–32)
Calcium: 8.8 mg/dL — ABNORMAL LOW (ref 8.9–10.3)
Chloride: 99 mmol/L (ref 98–111)
Creatinine, Ser: 0.78 mg/dL (ref 0.44–1.00)
GFR calc Af Amer: >60 mL/min (ref >60)
GFR calc non Af Amer: >60 mL/min (ref >60)
Glucose, Bld: 99 mg/dL (ref 70–99)
Potassium: 3.9 mmol/L (ref 3.5–5.1)
Sodium: 136 mmol/L (ref 135–145)
Total Bilirubin: 0.5 mg/dL (ref 0.3–1.2)
Total Protein: 7 g/dL (ref 6.5–8.1)

## 2019-09-25 LAB — VITAMIN D 25 HYDROXY (VIT D DEFICIENCY, FRACTURES): Vit D, 25-Hydroxy: 32.38 ng/mL (ref 30–100)

## 2019-09-26 ENCOUNTER — Other Ambulatory Visit (HOSPITAL_COMMUNITY): Payer: Medicare Other

## 2019-10-05 ENCOUNTER — Ambulatory Visit (HOSPITAL_COMMUNITY): Payer: Medicare Other | Admitting: Hematology

## 2019-10-05 ENCOUNTER — Other Ambulatory Visit: Payer: Self-pay

## 2019-10-05 ENCOUNTER — Ambulatory Visit (HOSPITAL_COMMUNITY)
Admission: RE | Admit: 2019-10-05 | Discharge: 2019-10-05 | Disposition: A | Discharge status: Home / Self Care | Payer: Medicare Other | Source: Ambulatory Visit | Attending: Hematology | Admitting: Hematology

## 2019-10-05 DIAGNOSIS — Z853 Personal history of malignant neoplasm of breast: Secondary | ICD-10-CM | POA: Insufficient documentation

## 2019-10-05 DIAGNOSIS — Z13820 Encounter for screening for osteoporosis: Secondary | ICD-10-CM | POA: Insufficient documentation

## 2019-10-05 DIAGNOSIS — Z780 Asymptomatic menopausal state: Secondary | ICD-10-CM | POA: Insufficient documentation

## 2019-10-05 DIAGNOSIS — D0511 Intraductal carcinoma in situ of right breast: Secondary | ICD-10-CM

## 2019-10-05 DIAGNOSIS — Z1231 Encounter for screening mammogram for malignant neoplasm of breast: Secondary | ICD-10-CM | POA: Insufficient documentation

## 2019-10-06 ENCOUNTER — Inpatient Hospital Stay (HOSPITAL_COMMUNITY): Payer: Medicare Other | Admitting: Hematology

## 2019-10-06 ENCOUNTER — Encounter (HOSPITAL_COMMUNITY): Payer: Self-pay | Admitting: Hematology

## 2019-10-06 NOTE — Progress Notes (Unsigned)
[place], [place] [address]   CLINIC:  Medical Oncology/Hematology  PCP:  [name], [address] [place] [address] [phone]   REASON FOR VISIT:  Follow-up for Breast Cancer   CURRENT THERAPY: Tamoxifen   BRIEF ONCOLOGIC HISTORY:  Oncology History  Malignant neoplasm of upper outer quadrant of female breast ([place])  09/05/2014 Imaging   assymetry noted in R breast on screening mammogram   09/18/2014 Imaging   Irregular mass in the 12 o'clock location of the right breast. Tissue diagnosis is recommended.   09/18/2014 Imaging   IMPRESSION: Irregular mass in the 12 o'clock location of the right breast. Tissue diagnosis is recommended.   10/09/2014 Imaging   8 x 12 x 8 CM abnormal linear clumped enhancement throughout the majority of the central and upper right breast.   10/17/2014 Initial Biopsy   Breast, right, needle core biopsy, central - DUCTAL CARCINOMA IN SITU, MIXED PATTERNS (CRIBRIFORM, COMEDO AND PAPILLARY), INVOLVING MULTIPLE CORES, AT LEAST 9 MM IN MAXIMAL EXTENT IN ANY ONE CORE. - FOCAL LOBULAR NEOPLASIA (ALH/LCIS). - NO INVASIVE CAR   11/20/2014 Definitive Surgery   Breast, simple mastectomy, right - DUCTAL CARCINOMA IN SITU, SEE COMMENT. - LOBULAR CARCINOMA IN SITU (CONVENTIONAL AND PLEOMORPHIC TYPES). - PREVIOUS BIOPSY SITES (X2). - IN SITU CARCINOMA IS 3CM FROM NEAREST MARGIN (DEEP).   01/01/2015 -  Anti-estrogen oral therapy   Tamoxifen daily.   07/21/2016 Breast US   BIRADS 4   07/29/2016 Procedure   Ultrasound guided biopsy of right axillary mass/lymph node. No apparent complications.   07/30/2016 Pathology Results   Pathology of the right axillary lymph node biopsy revealed LYMPHOID TISSUE WITH DERMATOPATHIC CHANGE. NO METASTATIC CARCINOMA IDENTIFIED.    Renal cell carcinoma ([place])  01/08/2016 Procedure   Robotic-assisted laparoscopic right partial nephrectomy by Dr. Tresa Moore.   01/08/2016 Pathology  Results   Kidney, wedge excision / partial resection, right renal mass RENAL CELL CARCINOMA, CLEAR CELL TYPE, WHO NUCLEAR GRADE 2 (2.4 CM) THE TUMOR IS CONFINED TO THE KIDNEY MARGINS OF RESECTION ARE NEGATIVE FOR TUMOR      CANCER STAGING: Cancer Staging Malignant neoplasm of upper outer quadrant of female breast ([place]) Staging form: Breast, AJCC 7th Edition - Clinical: Stage 0 (Tis (DCIS), N0, M0) - Signed by [name], MD on 10/11/2014  Renal cell carcinoma [facility]) Staging form: Kidney, AJCC 7th Edition - Clinical stage from 06/02/2016: Stage Unknown (T1a, NX, M0) - Signed by [name] Cancer, PA-C on 06/02/2016    INTERVAL HISTORY:  Ms. Furnace 74 y.o. female presents today for follow-up.  She reports overall doing well.  She denies any significant fatigue.  She continues on tamoxifen, tolerating well.  Denies any hot flashes or myalgias.  She denies any changes in her breasts.  No new lumps, masses, nipple inversion or discharge.  REVIEW OF SYSTEMS:  Review of Systems - Oncology   PAST MEDICAL/SURGICAL HISTORY:  Past Medical History:  Diagnosis Date  . Allergic rhinitis due to pollen   . Anxiety   . Arthritis    "knees, back, right elbow; left shoulder; right ankle" (11/20/2014)  . Breast cancer, right breast ([place])    "DCIS; zero stage" S/P mastectomy 11/20/2014  . Cervical spondylosis without myelopathy    all over  . Cervicalgia   . Chronic lower back pain   . Constipation    takes Miralax daily  . Degeneration of cervical intervertebral disc   .  Depression   . Diverticulosis of colon (without mention of hemorrhage)   . GERD (gastroesophageal reflux disease)    takes Omeprazole daily  . Graves' disease    "took a pill to correct"  . H/O hiatal hernia   . H/O urinary frequency   . Headache(784.0)    denies migraines since the 80's but has occ and takes Butalbital prn  . HTN (hypertension)    takes Metoprolol and Lisinopril daily  . Hypertension   . Insomnia     takes Ativan nigtly  . Joint pain    "all of them"  . Joint swelling    "knees, legs, ankles sometimes" (11/20/2014)  . Morbid obesity ([place])   . Numbness    LOWER LEGS  . Other postablative hypothyroidism    takes SYnthroid daily  . Palpitations   . Peptic ulcer, unspecified site, unspecified as acute or chronic, without mention of hemorrhage, perforation, or obstruction   . Primary localized osteoarthrosis, lower leg   . Pure hypercholesterolemia    takes Pravastin daily  . Recurrent UTI   . Renal cell carcinoma ([place]) 01/08/2016  . Renal mass    RIGHT  . Shortness of breath   . Sleep apnea    slight but doesn't require a CPAP (11/20/2014)  . Tendonitis of knee    bilateral   Past Surgical History:  Procedure Laterality Date  . ABDOMINAL HYSTERECTOMY    . ANTERIOR LAT LUMBAR FUSION  05/13/2012   Procedure: ANTERIOR LATERAL LUMBAR FUSION 2 LEVELS;  Surgeon: Faythe Ghee, MD;  Location: [facility];  Service: Neurosurgery;  Laterality: Left;  Left Lumbar Three-four,Lumbar four-five Extreme Lumbar Interbody Fusion with Percutaneous Pedicle Screws  . APPENDECTOMY    . BREAST BIOPSY Left ~ 2014  . BREAST BIOPSY Right 2015  . BREAST LUMPECTOMY Left 1988  . BREAST RECONSTRUCTION WITH PLACEMENT OF TISSUE EXPANDER AND FLEX HD (ACELLULAR HYDRATED DERMIS) Right 11/20/2014  . BREAST RECONSTRUCTION WITH PLACEMENT OF TISSUE EXPANDER AND FLEX HD (ACELLULAR HYDRATED DERMIS) Right 11/20/2014   Procedure: RIGHT BREAST RECONSTRUCTION WITH PLACEMENT OF TISSUE EXPANDER AND ACELLULAR DERMA MATRIX (ACELLULAR DERMA MATRIX);  Surgeon: Crissie Reese, MD;  Location: [facility];  Service: Plastics;  Laterality: Right;  . CATARACT EXTRACTION W/ INTRAOCULAR LENS  IMPLANT, BILATERAL Bilateral   . COLONOSCOPY  Sept 2008   RMR: normal rectum, left-sided diverticula, repeat in 2018  . COLONOSCOPY WITH PROPOFOL N/A 03/21/2018   Dr. Gala Romney: diverticulosis, internal grade I hemorrhoids. no future colonoscopies unless  develops new symptoms  . DIAGNOSTIC LAPAROSCOPY    . DILATION AND CURETTAGE OF UTERUS    . ESOPHAGOGASTRODUODENOSCOPY    . ESOPHAGOGASTRODUODENOSCOPY  2013   Dr. Gala Romney: Cervical esophageal web and Schatzki's ring s/p dilation, small hiatal hernia, antral and duodenal erosions likely NSAID effect, chronic duodenitis on path   . ESOPHAGOGASTRODUODENOSCOPY (EGD) WITH PROPOFOL N/A 03/21/2018   Dr. Gala Romney: mild Schatzki ring s/p dilation  . FOREIGN BODY REMOVAL Right 10/06/2013   Procedure: REMOVAL FOREIGN BODY EXTREMITY;  Surgeon: Carole Civil, MD;  Location: [facility];  Service: Orthopedics;  Laterality: Right;  . INJECTION KNEE Left 10/06/2013   Procedure: KNEE INJECTION;  Surgeon: Carole Civil, MD;  Location: [facility];  Service: Orthopedics;  Laterality: Left;  . JOINT REPLACEMENT Bilateral    knees  . KNEE ARTHROSCOPY Right   . KNEE ARTHROSCOPY WITH LATERAL MENISECTOMY Right 10/06/2013   Procedure: KNEE ARTHROSCOPY WITH LATERAL AND MEDIAL MENISECTOMY;  Surgeon: Carole Civil, MD;  Location: [facility];  Service: Orthopedics;  Laterality: Right;  END @ 1234  . lumpectomy on pelvis     "mass of nerves"  . MALONEY DILATION N/A 03/21/2018   Procedure: Venia Minks DILATION;  Surgeon: Daneil Dolin, MD;  [facility];  Service: Endoscopy;  Laterality: N/A;  . MASTECTOMY COMPLETE / SIMPLE W/ SENTINEL NODE BIOPSY Right 11/20/2014   axillary  . REMOVAL OF TISSUE EXPANDER AND PLACEMENT OF IMPLANT Right 12/13/2014   Procedure: REMOVAL OF RIGHT BREAST TISSUE EXPANDER;  Surgeon: Crissie Reese, MD;  Location: [facility];  Service: Plastics;  Laterality: Right;  . ROBOTIC ASSITED PARTIAL NEPHRECTOMY Right 01/08/2016   Procedure: XI ROBOTIC ASSITED PARTIAL NEPHRECTOMY;  Surgeon: Alexis Frock, MD;  Location: [facility];  Service: Urology;  Laterality: Right;  . SIMPLE MASTECTOMY WITH AXILLARY SENTINEL NODE BIOPSY Right 11/20/2014   Procedure: SIMPLE MASTECTOMY WITH AXILLARY SENTINEL NODE BIOPSY;  Surgeon:  Erroll Luna, MD;  Location: [facility];  Service: General;  Laterality: Right;  . TISSUE EXPANDER REMOVAL Right 12/13/2014   dr Harlow Mares  . TONSILLECTOMY    . TOTAL KNEE ARTHROPLASTY Right 01/24/2014   Procedure: TOTAL KNEE ARTHROPLASTY;  Surgeon: Carole Civil, MD;  Location: [facility];  Service: Orthopedics;  Laterality: Right;  . TOTAL KNEE ARTHROPLASTY Left 08/08/2014   Procedure: TOTAL KNEE ARTHROPLASTY;  Surgeon: Carole Civil, MD;  Location: [facility];  Service: Orthopedics;  Laterality: Left;  . TUBAL LIGATION    . UPPER GASTROINTESTINAL ENDOSCOPY       SOCIAL HISTORY:  Social History   Socioeconomic History  . Marital status: Married    Spouse name: Milbert Coulter  . Number of children: 4  . Years of education: Not on file  . Highest education level: Not on file  Occupational History  . Occupation: disability    Employer: UNEMPLOYED    Comment: since 2008, knee surgery  Tobacco Use  . Smoking status: Former Smoker    Packs/day: 1.00    Years: 27.00    Pack years: 27.00    Types: Cigarettes    Quit date: 12/26/1985    Years since quitting: 33.8  . Smokeless tobacco: Never Used  . Tobacco comment: Quit in 1887  Substance and Sexual Activity  . Alcohol use: Yes    Alcohol/week: 0.0 standard drinks    Comment: occasional wine  . Drug use: No  . Sexual activity: Yes    Birth control/protection: Surgical  Other Topics Concern  . Not on file  Social History Narrative   Currently married for 10 years. Husband's name is Milbert Coulter. She is an excellent singer. 8 children between she and her husband. 23 grand children. 12 great grandchildren.   Social Determinants of Health   Financial Resource Strain:   . Difficulty of Paying Living Expenses: Not on file  Food Insecurity:   . Worried About Charity fundraiser in the Last Year: Not on file  . Ran Out of Food in the Last Year: Not on file  Transportation Needs:   . Lack of Transportation (Medical): Not on file  . Lack of  Transportation (Non-Medical): Not on file  Physical Activity:   . Days of Exercise per Week: Not on file  . Minutes of Exercise per Session: Not on file  Stress:   . Feeling of Stress : Not on file  Social Connections:   . Frequency of Communication with Friends and Family: Not on file  . Frequency of Social Gatherings with Friends and  Family: Not on file  . Attends Religious Services: Not on file  . Active Member of Clubs or Organizations: Not on file  . Attends Archivist Meetings: Not on file  . Marital Status: Not on file  Intimate Partner Violence:   . Fear of Current or Ex-Partner: Not on file  . Emotionally Abused: Not on file  . Physically Abused: Not on file  . Sexually Abused: Not on file    FAMILY HISTORY:  Family History  Problem Relation Age of Onset  . Stroke Sister   . Osteoarthritis Sister   . Osteoarthritis Brother   . Obesity Brother   . Heart attack Sister   . Heart attack Father   . Lung cancer Maternal Grandmother        non-smoker  . Heart attack Unknown        2 Brothers, 3 sisters/Father died in 25's from old age  . Colon cancer Neg Hx        Mother died at 65 from blood clot, MI    CURRENT MEDICATIONS:  Outpatient Encounter Medications as of 10/06/2019  Medication Sig  . diphenhydrAMINE (BENADRYL) 50 MG capsule Take 50 mg by mouth at bedtime.  . gabapentin (NEURONTIN) 600 MG tablet TAKE 1 TABLET BY MOUTH  DURING THE DAY AND 1 TABLET AT BEDTIME  . hydrochlorothiazide (HYDRODIURIL) 25 MG tablet Take 25 mg by mouth daily.   Marland Kitchen levothyroxine (SYNTHROID, LEVOTHROID) 88 MCG tablet Take 88 mcg by mouth daily before breakfast.   . omeprazole (PRILOSEC) 20 MG capsule Take 20 mg by mouth daily. May take a second 20 mg dose as needed for heartburn  . oxybutynin (DITROPAN) 5 MG tablet Take 5 mg by mouth 2 (two) times daily.   . pravastatin (PRAVACHOL) 40 MG tablet Take 40 mg by mouth every morning.   . sertraline (ZOLOFT) 50 MG tablet TAKE 1 TABLET  BY MOUTH  DAILY  . tamoxifen (NOLVADEX) 20 MG tablet TAKE 1 TABLET BY MOUTH  DAILY  . acetaminophen (TYLENOL) 500 MG tablet Take 650 mg by mouth See admin instructions. Pt takes 650 mg in the morning and 650 mg at night  . [DISCONTINUED] fexofenadine (ALLEGRA) 180 MG tablet Take 180 mg by mouth daily.  . [DISCONTINUED] Ranitidine HCl (ZANTAC 75 PO) Take by mouth.     No facility-administered encounter medications on file as of 10/06/2019.    ALLERGIES:  Allergies  Allergen Reactions  . Propoxyphene Hcl Other (See Comments)     Hallucinations  . Pollen Extract Other (See Comments)    Sneezing, congestion  . Camphor Itching  . Dust Mite Extract Itching    Sneezing. Runny nose   . Latex Dermatitis and Rash  . Molds & Smuts Itching  . Tomato Hives     PHYSICAL EXAM:  ECOG Performance status: ***  Vitals:   10/06/19 1200  BP: 134/81  Pulse: 71  Resp: 18  Temp: (!) 97.4 F (36.3 C)  SpO2: 99%   Filed Weights   10/06/19 1200  Weight: 206 lb 9.6 oz (93.7 kg)    Physical Exam   LABORATORY DATA:  I have reviewed the labs as listed.  CBC    Component Value Date/Time   WBC 6.7 09/25/2019 0919   RBC 4.11 09/25/2019 0919   HGB 13.2 09/25/2019 0919   HCT 40.3 09/25/2019 0919   PLT 210 09/25/2019 0919   MCV 98.1 09/25/2019 0919   MCH 32.1 09/25/2019 0919   MCHC 32.8 09/25/2019  0919   RDW 13.0 09/25/2019 0919   LYMPHSABS 2.3 09/25/2019 0919   MONOABS 0.6 09/25/2019 0919   EOSABS 0.1 09/25/2019 0919   BASOSABS 0.0 09/25/2019 0919   CMP Latest Ref Rng & Units 09/25/2019 02/28/2019 03/14/2018  Glucose 70 - 99 mg/dL 99 95 107(H)  BUN 8 - 23 mg/dL 12 8 8   Creatinine 0.44 - 1.00 mg/dL 0.78 0.78 0.72  Sodium 135 - 145 mmol/L 136 138 135  Potassium 3.5 - 5.1 mmol/L 3.9 3.6 3.3(L)  Chloride 98 - 111 mmol/L 99 104 99(L)  CO2 22 - 32 mmol/L 26 24 28   Calcium 8.9 - 10.3 mg/dL 8.8(L) 8.7(L) 8.7(L)  Total Protein 6.5 - 8.1 g/dL 7.0 7.2 -  Total Bilirubin 0.3 - 1.2 mg/dL 0.5 0.7  -  Alkaline Phos 38 - 126 U/L 44 49 -  AST 15 - 41 U/L 44(H) 38 -  ALT 0 - 44 U/L 35 29 -       DIAGNOSTIC IMAGING:  I have independently reviewed the scans and discussed with the patient.   I have reviewed Francene Finders, NP's note and agree with the documentation.  I personally performed a face-to-face visit, made revisions and my assessment and plan is as follows.    ASSESSMENT & PLAN:   No problem-specific Assessment & Plan notes found for this encounter.      Orders placed this encounter:  No orders of the defined types were placed in this encounter.     Derek Jack, MD [place] [phone]

## 2019-10-09 ENCOUNTER — Other Ambulatory Visit (HOSPITAL_COMMUNITY): Payer: Medicare Other

## 2019-10-09 ENCOUNTER — Ambulatory Visit (HOSPITAL_COMMUNITY): Payer: Medicare Other

## 2019-11-27 DIAGNOSIS — E039 Hypothyroidism, unspecified: Secondary | ICD-10-CM | POA: Diagnosis not present

## 2019-11-27 DIAGNOSIS — E785 Hyperlipidemia, unspecified: Secondary | ICD-10-CM | POA: Diagnosis not present

## 2019-11-27 DIAGNOSIS — I10 Essential (primary) hypertension: Secondary | ICD-10-CM | POA: Diagnosis not present

## 2019-11-27 DIAGNOSIS — M159 Polyosteoarthritis, unspecified: Secondary | ICD-10-CM | POA: Diagnosis not present

## 2019-11-28 DIAGNOSIS — R7309 Other abnormal glucose: Secondary | ICD-10-CM | POA: Diagnosis not present

## 2019-11-28 DIAGNOSIS — I10 Essential (primary) hypertension: Secondary | ICD-10-CM | POA: Diagnosis not present

## 2019-11-28 DIAGNOSIS — N3281 Overactive bladder: Secondary | ICD-10-CM | POA: Diagnosis not present

## 2019-11-28 DIAGNOSIS — E039 Hypothyroidism, unspecified: Secondary | ICD-10-CM | POA: Diagnosis not present

## 2019-12-05 DIAGNOSIS — I10 Essential (primary) hypertension: Secondary | ICD-10-CM | POA: Diagnosis not present

## 2019-12-05 DIAGNOSIS — E785 Hyperlipidemia, unspecified: Secondary | ICD-10-CM | POA: Diagnosis not present

## 2019-12-05 DIAGNOSIS — E039 Hypothyroidism, unspecified: Secondary | ICD-10-CM | POA: Diagnosis not present

## 2020-01-02 DIAGNOSIS — S0083XD Contusion of other part of head, subsequent encounter: Secondary | ICD-10-CM | POA: Diagnosis not present

## 2020-01-02 DIAGNOSIS — J301 Allergic rhinitis due to pollen: Secondary | ICD-10-CM | POA: Diagnosis not present

## 2020-03-04 ENCOUNTER — Encounter (HOSPITAL_COMMUNITY): Payer: Self-pay | Admitting: Emergency Medicine

## 2020-03-04 ENCOUNTER — Other Ambulatory Visit: Payer: Self-pay

## 2020-03-04 ENCOUNTER — Observation Stay (HOSPITAL_COMMUNITY)
Admission: EM | Admit: 2020-03-04 | Discharge: 2020-03-07 | Disposition: A | Discharge status: Home / Self Care | Payer: Medicare Other | Attending: Internal Medicine | Admitting: Internal Medicine

## 2020-03-04 ENCOUNTER — Emergency Department (HOSPITAL_COMMUNITY): Payer: Medicare Other

## 2020-03-04 DIAGNOSIS — E039 Hypothyroidism, unspecified: Secondary | ICD-10-CM | POA: Insufficient documentation

## 2020-03-04 DIAGNOSIS — K219 Gastro-esophageal reflux disease without esophagitis: Secondary | ICD-10-CM | POA: Insufficient documentation

## 2020-03-04 DIAGNOSIS — R32 Unspecified urinary incontinence: Secondary | ICD-10-CM | POA: Diagnosis not present

## 2020-03-04 DIAGNOSIS — M1990 Unspecified osteoarthritis, unspecified site: Secondary | ICD-10-CM | POA: Insufficient documentation

## 2020-03-04 DIAGNOSIS — Z853 Personal history of malignant neoplasm of breast: Secondary | ICD-10-CM | POA: Diagnosis not present

## 2020-03-04 DIAGNOSIS — E785 Hyperlipidemia, unspecified: Secondary | ICD-10-CM

## 2020-03-04 DIAGNOSIS — E876 Hypokalemia: Secondary | ICD-10-CM | POA: Diagnosis not present

## 2020-03-04 DIAGNOSIS — F329 Major depressive disorder, single episode, unspecified: Secondary | ICD-10-CM | POA: Diagnosis not present

## 2020-03-04 DIAGNOSIS — F411 Generalized anxiety disorder: Secondary | ICD-10-CM | POA: Diagnosis not present

## 2020-03-04 DIAGNOSIS — Z6832 Body mass index (BMI) 32.0-32.9, adult: Secondary | ICD-10-CM | POA: Insufficient documentation

## 2020-03-04 DIAGNOSIS — Z79890 Hormone replacement therapy (postmenopausal): Secondary | ICD-10-CM | POA: Insufficient documentation

## 2020-03-04 DIAGNOSIS — Z79899 Other long term (current) drug therapy: Secondary | ICD-10-CM | POA: Diagnosis not present

## 2020-03-04 DIAGNOSIS — Z20822 Contact with and (suspected) exposure to covid-19: Secondary | ICD-10-CM | POA: Diagnosis not present

## 2020-03-04 DIAGNOSIS — K5900 Constipation, unspecified: Secondary | ICD-10-CM | POA: Diagnosis not present

## 2020-03-04 DIAGNOSIS — Z85528 Personal history of other malignant neoplasm of kidney: Secondary | ICD-10-CM | POA: Diagnosis not present

## 2020-03-04 DIAGNOSIS — I2510 Atherosclerotic heart disease of native coronary artery without angina pectoris: Secondary | ICD-10-CM | POA: Diagnosis not present

## 2020-03-04 DIAGNOSIS — R0602 Shortness of breath: Secondary | ICD-10-CM | POA: Diagnosis not present

## 2020-03-04 DIAGNOSIS — Z96653 Presence of artificial knee joint, bilateral: Secondary | ICD-10-CM | POA: Diagnosis not present

## 2020-03-04 DIAGNOSIS — I2584 Coronary atherosclerosis due to calcified coronary lesion: Secondary | ICD-10-CM | POA: Diagnosis not present

## 2020-03-04 DIAGNOSIS — Z8249 Family history of ischemic heart disease and other diseases of the circulatory system: Secondary | ICD-10-CM | POA: Insufficient documentation

## 2020-03-04 DIAGNOSIS — R0789 Other chest pain: Principal | ICD-10-CM | POA: Insufficient documentation

## 2020-03-04 DIAGNOSIS — G4730 Sleep apnea, unspecified: Secondary | ICD-10-CM | POA: Diagnosis not present

## 2020-03-04 DIAGNOSIS — R079 Chest pain, unspecified: Secondary | ICD-10-CM | POA: Diagnosis not present

## 2020-03-04 DIAGNOSIS — Z7982 Long term (current) use of aspirin: Secondary | ICD-10-CM | POA: Insufficient documentation

## 2020-03-04 DIAGNOSIS — J984 Other disorders of lung: Secondary | ICD-10-CM | POA: Diagnosis not present

## 2020-03-04 DIAGNOSIS — I10 Essential (primary) hypertension: Secondary | ICD-10-CM | POA: Insufficient documentation

## 2020-03-04 DIAGNOSIS — Z79810 Long term (current) use of selective estrogen receptor modulators (SERMs): Secondary | ICD-10-CM | POA: Diagnosis not present

## 2020-03-04 DIAGNOSIS — F32A Depression, unspecified: Secondary | ICD-10-CM | POA: Diagnosis present

## 2020-03-04 LAB — BASIC METABOLIC PANEL
Anion gap: 13 (ref 5–15)
BUN: 7 mg/dL — ABNORMAL LOW (ref 8–23)
CO2: 26 mmol/L (ref 22–32)
Calcium: 9 mg/dL (ref 8.9–10.3)
Chloride: 99 mmol/L (ref 98–111)
Creatinine, Ser: 0.71 mg/dL (ref 0.44–1.00)
GFR calc Af Amer: >60 mL/min (ref >60)
GFR calc non Af Amer: >60 mL/min (ref >60)
Glucose, Bld: 88 mg/dL (ref 70–99)
Potassium: 3.5 mmol/L (ref 3.5–5.1)
Sodium: 138 mmol/L (ref 135–145)

## 2020-03-04 LAB — CBC
HCT: 42.2 % (ref 36.0–46.0)
Hemoglobin: 14 g/dL (ref 12.0–15.0)
MCH: 32.1 pg (ref 26.0–34.0)
MCHC: 33.2 g/dL (ref 30.0–36.0)
MCV: 96.8 fL (ref 80.0–100.0)
Platelets: 223 10*3/uL (ref 150–400)
RBC: 4.36 MIL/uL (ref 3.87–5.11)
RDW: 12.2 % (ref 11.5–15.5)
WBC: 6.8 10*3/uL (ref 4.0–10.5)
nRBC: 0 % (ref 0.0–0.2)

## 2020-03-04 LAB — TROPONIN I (HIGH SENSITIVITY)
Troponin I (High Sensitivity): 4 ng/L (ref <18)
Troponin I (High Sensitivity): 4 ng/L (ref <18)

## 2020-03-04 MED ORDER — SODIUM CHLORIDE 0.9% FLUSH
3.0000 mL | Freq: Once | INTRAVENOUS | Status: AC
  Administered 2020-03-05: 3 mL via INTRAVENOUS

## 2020-03-04 NOTE — ED Triage Notes (Signed)
Pt c/o left sided chest pain and shortness of breath that has been intermittent x 1 week, worsening today.

## 2020-03-04 NOTE — ED Provider Notes (Signed)
[facility] Provider Note   CSN: [id number] Arrival date & time: 03/04/20  1530     History Chief Complaint  Patient presents with  . Chest Pain    Sara [name] is a 74 y.o. female with a past medical history of Graves' disease, right-sided breast cancer status postmastectomy, r renal cancer in remission, obesity, hypertension, who presents today for evaluation of chest pain.  She reports that she began having chest pain about 2 weeks ago.  She had an episode where it woke her up from her sleep, she felt like she had a hippopotamus on her chest and became diaphoretic and nauseous.  This lasted for about 10 minutes.  She has not had a similar episode.  She reports that worsening and becoming more frequent over the past 3 to 4 days she has had left upper chest pain.  She denies repeat episodes of feeling like she has a hippopotamus of her chest.  She reports compliance with all of her medications.  Currently she feels like the pain is in her left upper chest radiating up into her neck.  She denies any dental problems.  She does report shortness of breath that has been waxing and waning.  She denies any leg swelling.  She has not seen a cardiologist in many years.  [facility] P[facility].    Chart review shows that she has not had a cath in our system.  Her last echocardiogram was in 2014.  HPI     Past Medical History:  Diagnosis Date  . Allergic rhinitis due to pollen   . Anxiety   . Arthritis    "knees, back, right elbow; left shoulder; right ankle" (11/20/2014)  . Breast cancer, right breast ([place])    "DCIS; zero stage" S/P mastectomy 11/20/2014  . Cervical spondylosis without myelopathy    all over  . Cervicalgia   . Chronic lower back pain   . Constipation    takes Miralax daily  . Degeneration of cervical intervertebral disc   . Depression   . Diverticulosis of colon (without mention of hemorrhage)   . GERD (gastroesophageal  reflux disease)    takes Omeprazole daily  . Graves' disease    "took a pill to correct"  . H/O hiatal hernia   . H/O urinary frequency   . Headache(784.0)    denies migraines since the 80's but has occ and takes Butalbital prn  . HTN (hypertension)    takes Metoprolol and Lisinopril daily  . Hypertension   . Insomnia    takes Ativan nigtly  . Joint pain    "all of them"  . Joint swelling    "knees, legs, ankles sometimes" (11/20/2014)  . Morbid obesity ([place])   . Numbness    LOWER LEGS  . Other postablative hypothyroidism    takes SYnthroid daily  . Palpitations   . Peptic ulcer, unspecified site, unspecified as acute or chronic, without mention of hemorrhage, perforation, or obstruction   . Primary localized osteoarthrosis, lower leg   . Pure hypercholesterolemia    takes Pravastin daily  . Recurrent UTI   . Renal cell carcinoma ([place]) 01/08/2016  . Renal mass    RIGHT  . Shortness of breath   . Sleep apnea    slight but doesn't require a CPAP (11/20/2014)  . Tendonitis of knee    bilateral    Patient Active Problem List   Diagnosis Date Noted  . Encounter  for screening colonoscopy 01/25/2018  . Renal cell carcinoma ([place]) 01/08/2016  . Right kidney mass 12/23/2015  . Liver lesion 12/23/2015  . Malignant neoplasm of upper outer quadrant of female breast ([place]) 10/11/2014  . S/P total knee replacement, right 01/24/14 08/08/2014  . Difficulty in walking(719.7) 02/15/2014  . Postoperative stiffness of total knee replacement ([place]) 02/15/2014  . Pain in joint, lower leg 02/15/2014  . Abnormality of gait 10/11/2013  . S/P total knee replacement, left 08/08/14 10/09/2013  . Radicular pain of right lower extremity 07/25/2013  . Knee instability 07/25/2013  . Right knee pain 07/25/2013  . Chest pain 04/25/2013  . Patellar tendonitis 09/07/2012  . OA (osteoarthritis) of knee 02/09/2012  . GERD (gastroesophageal reflux disease) 12/01/2011  . Dysphagia 12/01/2011  . RUQ pain  12/01/2011  . Fatty liver 12/01/2011  . Hematochezia 12/01/2011  . INTERMITTENT VERTIGO 09/12/2010  . KNEE, ARTHRITIS, DEGEN./OSTEO 04/03/2009  . CHEST PAIN, RECURRENT 03/07/2009  . OTHER DYSPHAGIA 03/07/2009  . PALPITATIONS 02/06/2009  . LIVER FUNCTION TESTS, ABNORMAL, HX OF 01/01/2009  . MORBID OBESITY 12/31/2008  . PUD 12/31/2008  . BACK PAIN 12/24/2008  . ANSERINE BURSITIS 12/24/2008  . BENIGN POSITIONAL VERTIGO 07/16/2008  . H N P-LUMBAR 04/02/2008  . SPINAL STENOSIS, CERVICAL 02/15/2008  . SPINAL STENOSIS, LUMBAR 02/15/2008  . Patellar tendinitis 10/19/2007  . TEAR LATERAL MENISCUS 06/13/2007  . OBESITY NOS 10/29/2006  . ALLERGIC RHINITIS, SEASONAL 10/29/2006  . HYPOTHYROIDISM 09/07/2006  . HYPERLIPIDEMIA 09/07/2006  . HYPERTENSION 09/07/2006  . GERD 09/07/2006  . DIVERTICULOSIS, COLON 09/07/2006  . OVERACTIVE BLADDER 09/07/2006  . FIBROCYSTIC BREAST DISEASE 09/07/2006  . OSTEOARTHRITIS 09/07/2006    Past Surgical History:  Procedure Laterality Date  . ABDOMINAL HYSTERECTOMY    . ANTERIOR LAT LUMBAR FUSION  05/13/2012   Procedure: ANTERIOR LATERAL LUMBAR FUSION 2 LEVELS;  Surgeon: Faythe Ghee, MD;  Location: [facility];  Service: Neurosurgery;  Laterality: Left;  Left Lumbar Three-four,Lumbar four-five Extreme Lumbar Interbody Fusion with Percutaneous Pedicle Screws  . APPENDECTOMY    . BREAST BIOPSY Left ~ 2014  . BREAST BIOPSY Right 2015  . BREAST LUMPECTOMY Left 1988  . BREAST RECONSTRUCTION WITH PLACEMENT OF TISSUE EXPANDER AND FLEX HD (ACELLULAR HYDRATED DERMIS) Right 11/20/2014  . BREAST RECONSTRUCTION WITH PLACEMENT OF TISSUE EXPANDER AND FLEX HD (ACELLULAR HYDRATED DERMIS) Right 11/20/2014   Procedure: RIGHT BREAST RECONSTRUCTION WITH PLACEMENT OF TISSUE EXPANDER AND ACELLULAR DERMA MATRIX (ACELLULAR DERMA MATRIX);  Surgeon: Crissie Reese, MD;  Location: [facility];  Service: Plastics;  Laterality: Right;  . CATARACT EXTRACTION W/ INTRAOCULAR LENS  IMPLANT,  BILATERAL Bilateral   . COLONOSCOPY  Sept 2008   RMR: normal rectum, left-sided diverticula, repeat in 2018  . COLONOSCOPY WITH PROPOFOL N/A 03/21/2018   Dr. Gala Romney: diverticulosis, internal grade I hemorrhoids. no future colonoscopies unless develops new symptoms  . DIAGNOSTIC LAPAROSCOPY    . DILATION AND CURETTAGE OF UTERUS    . ESOPHAGOGASTRODUODENOSCOPY    . ESOPHAGOGASTRODUODENOSCOPY  2013   Dr. Gala Romney: Cervical esophageal web and Schatzki's ring s/p dilation, small hiatal hernia, antral and duodenal erosions likely NSAID effect, chronic duodenitis on path   . ESOPHAGOGASTRODUODENOSCOPY (EGD) WITH PROPOFOL N/A 03/21/2018   Dr. Gala Romney: mild Schatzki ring s/p dilation  . FOREIGN BODY REMOVAL Right 10/06/2013   Procedure: REMOVAL FOREIGN BODY EXTREMITY;  Surgeon: Carole Civil, MD;  Location: [facility];  Service: Orthopedics;  Laterality: Right;  . INJECTION KNEE Left 10/06/2013   Procedure: KNEE INJECTION;  Surgeon:  Carole Civil, MD;  Location: [facility];  Service: Orthopedics;  Laterality: Left;  . JOINT REPLACEMENT Bilateral    knees  . KNEE ARTHROSCOPY Right   . KNEE ARTHROSCOPY WITH LATERAL MENISECTOMY Right 10/06/2013   Procedure: KNEE ARTHROSCOPY WITH LATERAL AND MEDIAL MENISECTOMY;  Surgeon: Carole Civil, MD;  Location: [facility];  Service: Orthopedics;  Laterality: Right;  END @ 1234  . lumpectomy on pelvis     "mass of nerves"  . MALONEY DILATION N/A 03/21/2018   Procedure: Venia Minks DILATION;  Surgeon: Daneil Dolin, MD;  [facility];  Service: Endoscopy;  Laterality: N/A;  . MASTECTOMY COMPLETE / SIMPLE W/ SENTINEL NODE BIOPSY Right 11/20/2014   axillary  . REMOVAL OF TISSUE EXPANDER AND PLACEMENT OF IMPLANT Right 12/13/2014   Procedure: REMOVAL OF RIGHT BREAST TISSUE EXPANDER;  Surgeon: Crissie Reese, MD;  Location: [facility];  Service: Plastics;  Laterality: Right;  . ROBOTIC ASSITED PARTIAL NEPHRECTOMY Right 01/08/2016   Procedure: XI ROBOTIC ASSITED PARTIAL  NEPHRECTOMY;  Surgeon: Alexis Frock, MD;  Location: [facility];  Service: Urology;  Laterality: Right;  . SIMPLE MASTECTOMY WITH AXILLARY SENTINEL NODE BIOPSY Right 11/20/2014   Procedure: SIMPLE MASTECTOMY WITH AXILLARY SENTINEL NODE BIOPSY;  Surgeon: Erroll Luna, MD;  Location: [facility];  Service: General;  Laterality: Right;  . TISSUE EXPANDER REMOVAL Right 12/13/2014   dr Harlow Mares  . TONSILLECTOMY    . TOTAL KNEE ARTHROPLASTY Right 01/24/2014   Procedure: TOTAL KNEE ARTHROPLASTY;  Surgeon: Carole Civil, MD;  Location: [facility];  Service: Orthopedics;  Laterality: Right;  . TOTAL KNEE ARTHROPLASTY Left 08/08/2014   Procedure: TOTAL KNEE ARTHROPLASTY;  Surgeon: Carole Civil, MD;  Location: [facility];  Service: Orthopedics;  Laterality: Left;  . TUBAL LIGATION    . UPPER GASTROINTESTINAL ENDOSCOPY       OB History   No obstetric history on file.     Family History  Problem Relation Age of Onset  . Stroke Sister   . Osteoarthritis Sister   . Osteoarthritis Brother   . Obesity Brother   . Heart attack Sister   . Heart attack Father   . Lung cancer Maternal Grandmother        non-smoker  . Heart attack Other        2 Brothers, 3 sisters/Father died in 36's from old age  . Colon cancer Neg Hx        Mother died at 62 from blood clot, MI    Social History   Tobacco Use  . Smoking status: Former Smoker    Packs/day: 1.00    Years: 27.00    Pack years: 27.00    Types: Cigarettes    Quit date: 12/26/1985    Years since quitting: 34.2  . Smokeless tobacco: Never Used  . Tobacco comment: Quit in 1887  Substance Use Topics  . Alcohol use: Yes    Alcohol/week: 0.0 standard drinks    Comment: occasional wine  . Drug use: No    Home Medications Prior to Admission medications   Medication Sig Start Date End Date Taking? Authorizing Provider  acetaminophen (TYLENOL) 500 MG tablet Take 650 mg by mouth See admin instructions. Pt takes 650 mg in the morning and 650 mg at night     [provider]  diphenhydrAMINE (BENADRYL) 50 MG capsule Take 50 mg by mouth at bedtime.    [provider]  gabapentin (NEURONTIN) 600 MG tablet TAKE 1 TABLET BY MOUTH  DURING THE DAY AND 1 TABLET AT BEDTIME 07/18/19   Carole Civil, MD  hydrochlorothiazide (HYDRODIURIL) 25 MG tablet Take 25 mg by mouth daily.     [provider]  levothyroxine (SYNTHROID, LEVOTHROID) 88 MCG tablet Take 88 mcg by mouth daily before breakfast.     [provider]  omeprazole (PRILOSEC) 20 MG capsule Take 20 mg by mouth daily. May take a second 20 mg dose as needed for heartburn    [provider]  oxybutynin (DITROPAN) 5 MG tablet Take 5 mg by mouth 2 (two) times daily.  07/31/15   [provider]  pravastatin (PRAVACHOL) 40 MG tablet Take 40 mg by mouth every morning.     [provider]  sertraline (ZOLOFT) 50 MG tablet TAKE 1 TABLET BY MOUTH  DAILY 06/22/19   Lockamy, Randi L, NP-C  tamoxifen (NOLVADEX) 20 MG tablet TAKE 1 TABLET BY MOUTH  DAILY 06/21/19   Derek Jack, MD  Ranitidine HCl (ZANTAC 75 PO) Take by mouth.    12/01/11  [provider]    Allergies    Propoxyphene hcl, Pollen extract, Camphor, Dust mite extract, Latex, Molds & smuts, and Tomato  Review of Systems   Review of Systems  Constitutional: Positive for diaphoresis. Negative for chills and fever.  Respiratory: Positive for shortness of breath. Negative for cough and chest tightness.   Cardiovascular: Positive for chest pain. Negative for palpitations and leg swelling.  Gastrointestinal: Negative for abdominal pain.  Musculoskeletal: Positive for neck pain. Negative for back pain.  Skin: Negative for color change and wound.  Neurological: Negative for weakness and headaches.  Psychiatric/Behavioral: Negative for confusion.  All other systems reviewed and are negative.   Physical Exam Updated Vital Signs BP 128/67 (BP Location: Left Arm)   Pulse 65    Temp 98.5 F (36.9 C) (Oral)   Resp 16   Ht 5\' 6"  (1.676 m)   Wt 92.1 kg   SpO2 100%   BMI 32.77 kg/m   Physical Exam Vitals and nursing note reviewed.  Constitutional:      General: She is not in acute distress.    Appearance: She is well-developed. She is not diaphoretic.  HENT:     Head: Normocephalic and atraumatic.  Eyes:     General: No scleral icterus.       Right eye: No discharge.        Left eye: No discharge.     Conjunctiva/sclera: Conjunctivae normal.  Cardiovascular:     Rate and Rhythm: Normal rate and regular rhythm.     Pulses:          Radial pulses are 2+ on the right side and 2+ on the left side.       Posterior tibial pulses are 2+ on the right side and 2+ on the left side.     Heart sounds: Normal heart sounds. No murmur heard.   Pulmonary:     Effort: Pulmonary effort is normal. No respiratory distress.     Breath sounds: Normal breath sounds. No stridor. No decreased breath sounds.  Chest:     Chest wall: No mass or tenderness.     Comments: Unable to recreate patient's pain and symptoms with palpation over chest. Abdominal:     General: There is no distension.  Musculoskeletal:        General: No deformity.     Cervical back: Normal range of motion.     Right lower leg: No tenderness.  No edema.     Left lower leg: No tenderness. No edema.  Skin:    General: Skin is warm and dry.  Neurological:     General: No focal deficit present.     Mental Status: She is alert.     Cranial Nerves: No cranial nerve deficit.     Motor: No abnormal muscle tone.  Psychiatric:        Mood and Affect: Mood normal.        Behavior: Behavior normal.     ED Results / Procedures / Treatments   Labs (all labs ordered are listed, but only abnormal results are displayed) Labs Reviewed  BASIC METABOLIC PANEL - Abnormal; Notable for the following components:      Result Value   BUN 7 (*)    All other components within normal limits  SARS CORONAVIRUS 2 BY RT  PCR (HOSPITAL ORDER, [facility])  CBC  TSH  MAGNESIUM  BASIC METABOLIC PANEL  CBC  TROPONIN I (HIGH SENSITIVITY)  TROPONIN I (HIGH SENSITIVITY)    EKG EKG Interpretation  Date/Time:  Monday March 04 2020 15:34:00 EDT Ventricular Rate:  74 PR Interval:  148 QRS Duration: 74 QT Interval:  420 QTC Calculation: 466 R Axis:   -37 Text Interpretation: Normal sinus rhythm Possible Left atrial enlargement Left axis deviation Low voltage QRS Abnormal ECG previous T wave flattening has improved Confirmed by Theotis Burrow [phone]) on 03/04/2020 10:04:19 PM   Radiology DG Chest 2 View  Result Date: 03/04/2020 CLINICAL DATA:  Left-sided chest pain and shortness of breath, intermittent over the last week. EXAM: CHEST - 2 VIEW COMPARISON:  03/09/2018 FINDINGS: Heart size is normal. Mediastinal shadows are normal. The lungs are clear. The vascularity is normal. No effusions. No significant bone finding. IMPRESSION: No active cardiopulmonary disease. Electronically Signed   By: Nelson Chimes M.D.   On: 03/04/2020 16:19     Procedures Procedures (including critical care time)  Medications Ordered in ED Medications  nitroGLYCERIN (NITROSTAT) SL tablet 0.4 mg (0.4 mg Sublingual Given 03/05/20 0231)  aspirin EC tablet 81 mg (has no administration in time range)  sodium chloride flush (NS) 0.9 % injection 3 mL (3 mLs Intravenous Given 03/05/20 0257)  sodium chloride flush (NS) 0.9 % injection 3 mL (3 mLs Intravenous Given 03/05/20 0226)  alum & mag hydroxide-simeth (MAALOX/MYLANTA) 200-200-20 MG/5ML suspension 30 mL (30 mLs Oral Given 03/05/20 0027)    And  lidocaine (XYLOCAINE) 2 % viscous mouth solution 15 mL (15 mLs Oral Given 03/05/20 0027)  aspirin chewable tablet 324 mg (324 mg Oral Given 03/05/20 0231)    ED Course  I have reviewed the triage vital signs and the nursing notes.  Pertinent labs & imaging results that were available during my care of the patient were  reviewed by me and considered in my medical decision making (see chart for details).    MDM Rules/Calculators/A&P                         Patient is a 74 year old woman who presents today for evaluation of chest pain.  It sounds like she is having 2 different kinds of chest pain.  She has intermittent episodes where she feels a significant chest pressure with diaphoresis.  Independent of that she has left upper chest pain into her neck.  Labs obtained and reviewed, troponin x2 is normal.  CBC BMP are unremarkable.  TSH is normal.  Chest x-ray without evidence of acute consolidation or other abnormalities.  Heart score is significantly elevated.  In addition chart review shows that she has not had an echo since 2014 and does not appear to have had a heart cath in our system.  I am concerned that her intermittent episodes of chest pressure with diaphoresis that have occurred at rest are consistent with unstable angina.  She was treated with nitroglycerin while in the emergency room and aspirin.  Patient will require admission for continued monitoring and possible cardiology evaluation.  I spoke with Dr. Velia Meyer who will see patient for admission.    This patient was seen as a shared visit with Dr. Leonette Monarch.  Note: Portions of this report may have been transcribed using voice recognition software. Every effort was made to ensure accuracy; however, inadvertent computerized transcription errors may be present   Final Clinical Impression(s) / ED Diagnoses Final diagnoses:  Chest pain, unspecified type    Rx / DC Orders ED Discharge Orders    None       Ollen Gross 03/05/20 0351    Fatima Blank, MD 03/05/20 0800

## 2020-03-05 ENCOUNTER — Observation Stay (HOSPITAL_COMMUNITY): Payer: Medicare Other

## 2020-03-05 ENCOUNTER — Encounter (HOSPITAL_COMMUNITY): Payer: Self-pay | Admitting: Internal Medicine

## 2020-03-05 DIAGNOSIS — Z20822 Contact with and (suspected) exposure to covid-19: Secondary | ICD-10-CM | POA: Diagnosis not present

## 2020-03-05 DIAGNOSIS — K219 Gastro-esophageal reflux disease without esophagitis: Secondary | ICD-10-CM

## 2020-03-05 DIAGNOSIS — E039 Hypothyroidism, unspecified: Secondary | ICD-10-CM | POA: Diagnosis not present

## 2020-03-05 DIAGNOSIS — F32A Depression, unspecified: Secondary | ICD-10-CM | POA: Diagnosis present

## 2020-03-05 DIAGNOSIS — R0789 Other chest pain: Secondary | ICD-10-CM

## 2020-03-05 DIAGNOSIS — E876 Hypokalemia: Secondary | ICD-10-CM | POA: Diagnosis not present

## 2020-03-05 DIAGNOSIS — R079 Chest pain, unspecified: Secondary | ICD-10-CM

## 2020-03-05 DIAGNOSIS — E785 Hyperlipidemia, unspecified: Secondary | ICD-10-CM

## 2020-03-05 DIAGNOSIS — F329 Major depressive disorder, single episode, unspecified: Secondary | ICD-10-CM

## 2020-03-05 DIAGNOSIS — I10 Essential (primary) hypertension: Secondary | ICD-10-CM

## 2020-03-05 LAB — TROPONIN I (HIGH SENSITIVITY): Troponin I (High Sensitivity): 5 ng/L (ref <18)

## 2020-03-05 LAB — BASIC METABOLIC PANEL
Anion gap: 12 (ref 5–15)
BUN: 8 mg/dL (ref 8–23)
CO2: 23 mmol/L (ref 22–32)
Calcium: 8.3 mg/dL — ABNORMAL LOW (ref 8.9–10.3)
Chloride: 102 mmol/L (ref 98–111)
Creatinine, Ser: 0.69 mg/dL (ref 0.44–1.00)
GFR calc Af Amer: >60 mL/min (ref >60)
GFR calc non Af Amer: >60 mL/min (ref >60)
Glucose, Bld: 108 mg/dL — ABNORMAL HIGH (ref 70–99)
Potassium: 2.9 mmol/L — ABNORMAL LOW (ref 3.5–5.1)
Sodium: 137 mmol/L (ref 135–145)

## 2020-03-05 LAB — MAGNESIUM: Magnesium: 2 mg/dL (ref 1.7–2.4)

## 2020-03-05 LAB — CBC
HCT: 38 % (ref 36.0–46.0)
Hemoglobin: 12.5 g/dL (ref 12.0–15.0)
MCH: 32.1 pg (ref 26.0–34.0)
MCHC: 32.9 g/dL (ref 30.0–36.0)
MCV: 97.7 fL (ref 80.0–100.0)
Platelets: 190 K/uL (ref 150–400)
RBC: 3.89 MIL/uL (ref 3.87–5.11)
RDW: 12.6 % (ref 11.5–15.5)
WBC: 7.9 K/uL (ref 4.0–10.5)
nRBC: 0 % (ref 0.0–0.2)

## 2020-03-05 LAB — SARS CORONAVIRUS 2 BY RT PCR (HOSPITAL ORDER, PERFORMED IN ~~LOC~~ HOSPITAL LAB): SARS Coronavirus 2: NEGATIVE

## 2020-03-05 LAB — TSH: TSH: 1.504 u[IU]/mL (ref 0.350–4.500)

## 2020-03-05 LAB — D-DIMER, QUANTITATIVE: D-Dimer, Quant: 0.56 ug/mL-FEU — ABNORMAL HIGH (ref 0.00–0.50)

## 2020-03-05 MED ORDER — PRAVASTATIN SODIUM 40 MG PO TABS
40.0000 mg | ORAL_TABLET | Freq: Every day | ORAL | Status: DC
  Administered 2020-03-05 – 2020-03-06 (×2): 40 mg via ORAL
  Filled 2020-03-05 (×2): qty 1

## 2020-03-05 MED ORDER — LIDOCAINE VISCOUS HCL 2 % MT SOLN
15.0000 mL | Freq: Once | OROMUCOSAL | Status: AC
  Administered 2020-03-05: 15 mL via ORAL
  Filled 2020-03-05: qty 15

## 2020-03-05 MED ORDER — PANTOPRAZOLE SODIUM 40 MG PO TBEC
40.0000 mg | DELAYED_RELEASE_TABLET | Freq: Every day | ORAL | Status: DC
  Administered 2020-03-05 – 2020-03-07 (×3): 40 mg via ORAL
  Filled 2020-03-05 (×3): qty 1

## 2020-03-05 MED ORDER — METOPROLOL TARTRATE 50 MG PO TABS
50.0000 mg | ORAL_TABLET | Freq: Once | ORAL | Status: AC
  Administered 2020-03-05: 50 mg via ORAL
  Filled 2020-03-05: qty 1

## 2020-03-05 MED ORDER — ALUM & MAG HYDROXIDE-SIMETH 200-200-20 MG/5ML PO SUSP
30.0000 mL | Freq: Once | ORAL | Status: AC
  Administered 2020-03-05: 30 mL via ORAL
  Filled 2020-03-05: qty 30

## 2020-03-05 MED ORDER — GABAPENTIN 600 MG PO TABS
600.0000 mg | ORAL_TABLET | Freq: Two times a day (BID) | ORAL | Status: DC
  Administered 2020-03-05 – 2020-03-07 (×6): 600 mg via ORAL
  Filled 2020-03-05 (×8): qty 1

## 2020-03-05 MED ORDER — MORPHINE SULFATE (PF) 2 MG/ML IV SOLN: 1.0000 mg | INTRAVENOUS | Status: DC | PRN

## 2020-03-05 MED ORDER — LORATADINE 10 MG PO TABS
10.0000 mg | ORAL_TABLET | Freq: Once | ORAL | Status: AC
  Administered 2020-03-05: 10 mg via ORAL
  Filled 2020-03-05: qty 1

## 2020-03-05 MED ORDER — ASPIRIN EC 81 MG PO TBEC
81.0000 mg | DELAYED_RELEASE_TABLET | Freq: Every day | ORAL | Status: DC
  Administered 2020-03-05 – 2020-03-07 (×3): 81 mg via ORAL
  Filled 2020-03-05 (×3): qty 1

## 2020-03-05 MED ORDER — IOHEXOL 350 MG/ML SOLN
100.0000 mL | Freq: Once | INTRAVENOUS | Status: AC | PRN
  Administered 2020-03-05: 100 mL via INTRAVENOUS

## 2020-03-05 MED ORDER — ASPIRIN 81 MG PO CHEW
324.0000 mg | CHEWABLE_TABLET | Freq: Once | ORAL | Status: AC
  Administered 2020-03-05: 324 mg via ORAL
  Filled 2020-03-05: qty 4

## 2020-03-05 MED ORDER — NITROGLYCERIN 0.4 MG SL SUBL
SUBLINGUAL_TABLET | SUBLINGUAL | Status: AC
  Administered 2020-03-05: 0.8 mg via SUBLINGUAL
  Filled 2020-03-05: qty 2

## 2020-03-05 MED ORDER — SERTRALINE HCL 50 MG PO TABS
50.0000 mg | ORAL_TABLET | Freq: Every day | ORAL | Status: DC
  Administered 2020-03-05 – 2020-03-07 (×3): 50 mg via ORAL
  Filled 2020-03-05 (×4): qty 1

## 2020-03-05 MED ORDER — POTASSIUM CHLORIDE CRYS ER 20 MEQ PO TBCR
40.0000 meq | EXTENDED_RELEASE_TABLET | ORAL | Status: AC
  Administered 2020-03-05 (×2): 40 meq via ORAL
  Filled 2020-03-05 (×2): qty 2

## 2020-03-05 MED ORDER — NITROGLYCERIN 0.4 MG SL SUBL: 0.8000 mg | SUBLINGUAL_TABLET | Freq: Once | SUBLINGUAL | Status: AC

## 2020-03-05 MED ORDER — CARVEDILOL 3.125 MG PO TABS
3.1250 mg | ORAL_TABLET | Freq: Two times a day (BID) | ORAL | Status: DC
  Administered 2020-03-05 – 2020-03-07 (×4): 3.125 mg via ORAL
  Filled 2020-03-05 (×4): qty 1

## 2020-03-05 MED ORDER — SODIUM CHLORIDE 0.9 % IV SOLN: INTRAVENOUS | Status: DC

## 2020-03-05 MED ORDER — HYDROCHLOROTHIAZIDE 25 MG PO TABS
25.0000 mg | ORAL_TABLET | Freq: Every day | ORAL | Status: DC
  Administered 2020-03-05 – 2020-03-06 (×2): 25 mg via ORAL
  Filled 2020-03-05 (×2): qty 1

## 2020-03-05 MED ORDER — LEVOTHYROXINE SODIUM 88 MCG PO TABS
88.0000 ug | ORAL_TABLET | Freq: Every day | ORAL | Status: DC
  Administered 2020-03-05 – 2020-03-07 (×3): 88 ug via ORAL
  Filled 2020-03-05 (×3): qty 1

## 2020-03-05 MED ORDER — SODIUM CHLORIDE 0.9% FLUSH
3.0000 mL | Freq: Two times a day (BID) | INTRAVENOUS | Status: DC
  Administered 2020-03-05 – 2020-03-06 (×5): 3 mL via INTRAVENOUS

## 2020-03-05 MED ORDER — NITROGLYCERIN 0.4 MG SL SUBL
0.4000 mg | SUBLINGUAL_TABLET | SUBLINGUAL | Status: DC | PRN
  Administered 2020-03-05: 0.4 mg via SUBLINGUAL
  Filled 2020-03-05: qty 1

## 2020-03-05 MED ORDER — ENOXAPARIN SODIUM 40 MG/0.4ML ~~LOC~~ SOLN
40.0000 mg | SUBCUTANEOUS | Status: DC
  Administered 2020-03-06 – 2020-03-07 (×2): 40 mg via SUBCUTANEOUS
  Filled 2020-03-05 (×3): qty 0.4

## 2020-03-05 NOTE — Consult Note (Addendum)
Cardiology Consultation:   Patient ID: [name]; [id number]; Jan 28, 1946   Admit date: 03/04/2020 Date of Consult: 03/05/2020  Primary Care Provider: [name], MD Primary Cardiologist: No primary care provider on file. Dr Johnsie Cancel, 04/2013 Primary Electrophysiologist:  None   Patient Profile:   Sara [name] is a 74 y.o. female with a hx of nl ECHO and MV 2009 2nd atypical CP, HTN, HLD, acquired hypothyroid, GERD, IBS, OA, breast and renal cell CA, morbid obesity, mild CPAP, who is being seen today for the evaluation of chest pain at the request of Dr Grandville Silos.  History of Present Illness:   Sara [name] was evaluated 2009 for CP (central, improved by rubbing her chest) w/ nl MV and echo. 2014 CP eval, ECG w/ inferolateral T wave changes & poor R wave progression, pt c/o CP>> EF and MV nl.  Sara [name] began having chest pain 2 weeks ago.  She was in normal health when she went to bed.  She woke up during the night with 9/10 central chest pressure.  It did not radiate.  It was not associated with shortness of breath.  She drank water but did not take any medications for it.  The symptoms eased off and she was able to go back to sleep.  However, ever since then, she has had the central chest pressure.  It is generally a 4/10.  There is not an exertional component.  Today, she got nitroglycerin and it did not change the central chest pain.  5 days ago, she developed another chest pain.  This is in her upper left chest.  It radiates up into the left side of her neck.  There is a tender spot in her upper left chest and pressure on that reproduces the pain, including the radiation.  That pain can reach an 8/10.  She does not wake with it, but it starts in the morning after she has gotten her shower and is getting breakfast.  It is continuous after that.  Today, she got worse and came to the emergency room.  In the emergency room, she got a sublingual nitroglycerin which greatly improved  the upper left chest pain, but did not change the central chest pain.  None of her chest pain is exertional.  However, the upper left chest pain starts after she gets up and gets ready.  She reports that by the time she gets a shower and gets dressed and goes to the kitchen to get coffee, she is tired and has to sit down.  She traveled in April to [facility], but no long car rides since then.    She has not been immobilized or in any kind of cast or splint.  She does not wake with lower extremity edema.  She denies orthopnea or PND.  She quit smoking in the 80s.  Her family physician checks her cholesterol.  She checks her blood pressure occasionally and it is in the 120s-130s over 70s-80s.  She does not exercise.  She states that she is not very active, she is busy a couple of days a week, but does not do much the other days.   Past Medical History:  Diagnosis Date  . Allergic rhinitis due to pollen   . Anxiety   . Arthritis    "knees, back, right elbow; left shoulder; right ankle" (11/20/2014)  . Breast cancer, right breast ([place]) 2016   "DCIS; zero stage" S/P mastectomy, cancer free since then  . Cervical spondylosis  without myelopathy   . Cervicalgia   . Chronic lower back pain   . Constipation    takes Miralax daily  . Degeneration of cervical intervertebral disc   . Depression   . Diverticulosis of colon (without mention of hemorrhage)   . GERD (gastroesophageal reflux disease)    takes Omeprazole daily  . Graves' disease    "took a pill to correct"  . H/O hiatal hernia   . H/O urinary frequency   . Headache(784.0)    denies migraines since the 80's but has occ and takes Butalbital prn  . HTN (hypertension)    takes Metoprolol and Lisinopril daily  . Insomnia    takes Ativan nigtly  . Joint pain    "all of them"  . Joint swelling    "knees, legs, ankles sometimes" (11/20/2014)  . Morbid obesity ([facility])   . Numbness    LOWER LEGS  . Other postablative  hypothyroidism    takes SYnthroid daily  . Palpitations   . Peptic ulcer, unspecified site, unspecified as acute or chronic, without mention of hemorrhage, perforation, or obstruction   . Primary localized osteoarthrosis, lower leg   . Pure hypercholesterolemia    takes Pravastin daily  . Recurrent UTI   . Renal cell carcinoma ([place]) 01/08/2016   Right renal mass s/p partial right nephrectomy, cancer free since 2017  . Shortness of breath   . Sleep apnea    slight but doesn't require a CPAP (11/20/2014)  . Tendonitis of knee    bilateral    Past Surgical History:  Procedure Laterality Date  . ABDOMINAL HYSTERECTOMY    . ANTERIOR LAT LUMBAR FUSION  05/13/2012   Procedure: ANTERIOR LATERAL LUMBAR FUSION 2 LEVELS;  Surgeon: Faythe Ghee, MD;  Location: [facility];  Service: Neurosurgery;  Laterality: Left;  Left Lumbar Three-four,Lumbar four-five Extreme Lumbar Interbody Fusion with Percutaneous Pedicle Screws  . APPENDECTOMY    . BREAST BIOPSY Left ~ 2014  . BREAST BIOPSY Right 2015  . BREAST LUMPECTOMY Left 1988  . BREAST RECONSTRUCTION WITH PLACEMENT OF TISSUE EXPANDER AND FLEX HD (ACELLULAR HYDRATED DERMIS) Right 11/20/2014  . BREAST RECONSTRUCTION WITH PLACEMENT OF TISSUE EXPANDER AND FLEX HD (ACELLULAR HYDRATED DERMIS) Right 11/20/2014   Procedure: RIGHT BREAST RECONSTRUCTION WITH PLACEMENT OF TISSUE EXPANDER AND ACELLULAR DERMA MATRIX (ACELLULAR DERMA MATRIX);  Surgeon: Crissie Reese, MD;  Location: [facility];  Service: Plastics;  Laterality: Right;  . CATARACT EXTRACTION W/ INTRAOCULAR LENS  IMPLANT, BILATERAL Bilateral   . COLONOSCOPY  Sept 2008   RMR: normal rectum, left-sided diverticula, repeat in 2018  . COLONOSCOPY WITH PROPOFOL N/A 03/21/2018   Dr. Gala Romney: diverticulosis, internal grade I hemorrhoids. no future colonoscopies unless develops new symptoms  . DIAGNOSTIC LAPAROSCOPY    . DILATION AND CURETTAGE OF UTERUS    . ESOPHAGOGASTRODUODENOSCOPY    . ESOPHAGOGASTRODUODENOSCOPY   2013   Dr. Gala Romney: Cervical esophageal web and Schatzki's ring s/p dilation, small hiatal hernia, antral and duodenal erosions likely NSAID effect, chronic duodenitis on path   . ESOPHAGOGASTRODUODENOSCOPY (EGD) WITH PROPOFOL N/A 03/21/2018   Dr. Gala Romney: mild Schatzki ring s/p dilation  . FOREIGN BODY REMOVAL Right 10/06/2013   Procedure: REMOVAL FOREIGN BODY EXTREMITY;  Surgeon: Carole Civil, MD;  Location[facility];  Service: Orthopedics;  Laterality: Right;  . INJECTION KNEE Left 10/06/2013   Procedure: KNEE INJECTION;  Surgeon: Carole Civil, MD;  Location: [facility];  Service: Orthopedics;  Laterality: Left;  . JOINT REPLACEMENT Bilateral  knees  . KNEE ARTHROSCOPY Right   . KNEE ARTHROSCOPY WITH LATERAL MENISECTOMY Right 10/06/2013   Procedure: KNEE ARTHROSCOPY WITH LATERAL AND MEDIAL MENISECTOMY;  Surgeon: Carole Civil, MD;  Location: [facility];  Service: Orthopedics;  Laterality: Right;  END @ 1234  . lumpectomy on pelvis     "mass of nerves"  . MALONEY DILATION N/A 03/21/2018   Procedure: Venia Minks DILATION;  Surgeon: Daneil Dolin, MD;  [facility];  Service: Endoscopy;  Laterality: N/A;  . MASTECTOMY COMPLETE / SIMPLE W/ SENTINEL NODE BIOPSY Right 11/20/2014   axillary  . REMOVAL OF TISSUE EXPANDER AND PLACEMENT OF IMPLANT Right 12/13/2014   Procedure: REMOVAL OF RIGHT BREAST TISSUE EXPANDER;  Surgeon: Crissie Reese, MD;  Location: [facility];  Service: Plastics;  Laterality: Right;  . ROBOTIC ASSITED PARTIAL NEPHRECTOMY Right 01/08/2016   Procedure: XI ROBOTIC ASSITED PARTIAL NEPHRECTOMY;  Surgeon: Alexis Frock, MD;  Location: [facility];  Service: Urology;  Laterality: Right;  . SIMPLE MASTECTOMY WITH AXILLARY SENTINEL NODE BIOPSY Right 11/20/2014   Procedure: SIMPLE MASTECTOMY WITH AXILLARY SENTINEL NODE BIOPSY;  Surgeon: Erroll Luna, MD;  Location: [facility];  Service: General;  Laterality: Right;  . TISSUE EXPANDER REMOVAL Right 12/13/2014   dr Harlow Mares  . TONSILLECTOMY    .  TOTAL KNEE ARTHROPLASTY Right 01/24/2014   Procedure: TOTAL KNEE ARTHROPLASTY;  Surgeon: Carole Civil, MD;  Location: [facility];  Service: Orthopedics;  Laterality: Right;  . TOTAL KNEE ARTHROPLASTY Left 08/08/2014   Procedure: TOTAL KNEE ARTHROPLASTY;  Surgeon: Carole Civil, MD;  Location[facility];  Service: Orthopedics;  Laterality: Left;  . TUBAL LIGATION    . UPPER GASTROINTESTINAL ENDOSCOPY       Prior to Admission medications   Medication Sig Start Date End Date Taking? Authorizing Provider  acetaminophen (TYLENOL) 500 MG tablet Take 650 mg by mouth in the morning and at bedtime.    Yes [provider]  aspirin EC 81 MG tablet Take 81 mg by mouth daily. Swallow whole.   Yes [provider]  diphenhydrAMINE (BENADRYL) 50 MG capsule Take 50 mg by mouth at bedtime.   Yes [provider]  gabapentin (NEURONTIN) 600 MG tablet TAKE 1 TABLET BY MOUTH  DURING THE DAY AND 1 TABLET AT BEDTIME Patient taking differently: Take 600 mg by mouth 2 (two) times daily.  07/18/19  Yes Carole Civil, MD  hydrochlorothiazide (HYDRODIURIL) 25 MG tablet Take 25 mg by mouth daily.    Yes [provider]  levothyroxine (SYNTHROID, LEVOTHROID) 88 MCG tablet Take 88 mcg by mouth daily before breakfast.    Yes [provider]  omeprazole (PRILOSEC) 20 MG capsule Take 20 mg by mouth daily. May take a second 20 mg dose as needed for heartburn   Yes [provider]  oxybutynin (DITROPAN) 5 MG tablet Take 5 mg by mouth 2 (two) times daily.  07/31/15  Yes [provider]  pravastatin (PRAVACHOL) 40 MG tablet Take 40 mg by mouth daily.    Yes [provider]  sertraline (ZOLOFT) 50 MG tablet TAKE 1 TABLET BY MOUTH  DAILY Patient taking differently: Take 50 mg by mouth daily.  06/22/19  Yes Lockamy, Randi L, NP-C  tamoxifen (NOLVADEX) 20 MG tablet TAKE 1 TABLET BY MOUTH  DAILY Patient not taking: Reported on 03/04/2020 06/21/19   Derek Jack, MD  Ranitidine HCl (ZANTAC 75 PO) Take by mouth.    12/01/11  [provider]  Inpatient Medications: Scheduled Meds: . [START ON 03/06/2020] aspirin EC  81 mg Oral Daily  . carvedilol  3.125 mg Oral BID WC  . [START ON 03/06/2020] enoxaparin (LOVENOX) injection  40 mg Subcutaneous Q24H  . gabapentin  600 mg Oral BID  . hydrochlorothiazide  25 mg Oral Daily  . levothyroxine  88 mcg Oral Q0600  . pantoprazole  40 mg Oral Q0600  . potassium chloride  40 mEq Oral Q4H  . pravastatin  40 mg Oral q1800  . sertraline  50 mg Oral Daily  . sodium chloride flush  3 mL Intravenous Q12H   Continuous Infusions:  PRN Meds: morphine injection, nitroGLYCERIN  Allergies:    Allergies  Allergen Reactions  . Propoxyphene Hcl Other (See Comments)     Hallucinations  . Pollen Extract Other (See Comments)    Sneezing, congestion  . Camphor Itching  . Dust Mite Extract Itching    Sneezing. Runny nose   . Latex Dermatitis and Rash  . Molds & Smuts Itching  . Tomato Hives    Social History:   Social History   Socioeconomic History  . Marital status: Married    Spouse name: Sara [name]  . Number of children: 4  . Years of education: Not on file  . Highest education level: Not on file  Occupational History  . Occupation: disability    Employer: UNEMPLOYED    Comment: since 2008, knee surgery  Tobacco Use  . Smoking status: Former Smoker    Packs/day: 1.00    Years: 27.00    Pack years: 27.00    Types: Cigarettes    Quit date: 12/26/1985    Years since quitting: 34.2  . Smokeless tobacco: Never Used  . Tobacco comment: Quit in 1987  Substance and Sexual Activity  . Alcohol use: Yes    Alcohol/week: 0.0 standard drinks    Comment: occasional wine  . Drug use: No  . Sexual activity: Yes    Birth control/protection: Surgical  Other Topics Concern  . Not on file  Social History Narrative   Currently married for 10 years. Husband's name is Sara [name]. She is an excellent  singer. 8 children between she and her husband. 23 grand children. 12 great grandchildren.   Social Determinants of Health   Financial Resource Strain:   . Difficulty of Paying Living Expenses:   Food Insecurity:   . Worried About Charity fundraiser in the Last Year:   . Arboriculturist in the Last Year:   Transportation Needs:   . Film/video editor (Medical):   Marland Kitchen Lack of Transportation (Non-Medical):   Physical Activity:   . Days of Exercise per Week:   . Minutes of Exercise per Session:   Stress:   . Feeling of Stress :   Social Connections:   . Frequency of Communication with Friends and Family:   . Frequency of Social Gatherings with Friends and Family:   . Attends Religious Services:   . Active Member of Clubs or Organizations:   . Attends Archivist Meetings:   Marland Kitchen Marital Status:   Intimate Partner Violence:   . Fear of Current or Ex-Partner:   . Emotionally Abused:   Marland Kitchen Physically Abused:   . Sexually Abused:     Family History:   Family History  Problem Relation Age of Onset  . Stroke Sister   . Heart attack Sister        In her 77s  . Osteoarthritis Sister   .  Heart attack Sister        In her 52s  . Osteoarthritis Brother   . Obesity Brother   . Heart attack Sister        In her 66s  . Heart attack Father        He died at age 92  . Lung cancer Maternal Grandmother        non-smoker  . Colon cancer Neg Hx        Mother died at 52 from blood clot, MI   Family Status:  Family Status  Relation Name Status  . Sister  Alive  . Sister  Alive  . Brother  Alive  . Brother  Alive  . Sister  Alive  . Mother  Deceased at age 33       blood clot  . Father  Deceased at age 76  . Mat Aunt x5 Deceased  . Mat Uncle x2 Deceased  . Pat Uncle x1 Alive  . MGM  Deceased  . MGF  Deceased  . PGM  Deceased  . PGF  Deceased at age 74s  . Neg Hx  (Not Specified)    ROS:  Please see the history of present illness.  All other ROS reviewed and  negative.     Physical Exam/Data:   Vitals:   03/04/20 2347 03/05/20 0002 03/05/20 0017 03/05/20 0032  BP:  120/73 123/72 (!) 143/79  Pulse: 69 63 66 68  Resp: 17 14 17 16   Temp:      TempSrc:      SpO2: 98% 98% 99% 100%  Weight:      Height:       No intake or output data in the 24 hours ending 03/05/20 1230  Last 3 Weights 03/04/2020 10/06/2019 03/02/2019  Weight (lbs) 203 lb 206 lb 9.6 oz 200 lb  Weight (kg) 92.08 kg 93.713 kg 90.719 kg     Body mass index is 32.77 kg/m.   General:  Well nourished, well developed, female in no acute distress HEENT: normal Lymph: no adenopathy Neck: JVD -none Endocrine:  No thryomegaly Vascular: No carotid bruits; 4/4 extremity pulses 2+  Cardiac:  normal S1, S2; RRR; no murmur Lungs:  clear bilaterally, no wheezing, rhonchi or rale,+ chest wall tenderness Abd: soft, nontender, no hepatomegaly  Ext: no edema Musculoskeletal:  No deformities, BUE and BLE strength normal and equal Skin: warm and dry  Neuro:  CNs 2-12 intact, no focal abnormalities noted Psych:  Normal affect   EKG:  The EKG was personally reviewed and demonstrates:   SR, HR 74, low voltage, no acute ischemic changes Telemetry:  Telemetry was personally reviewed and demonstrates: Sinus rhythm   CV studies:   ECHO: 05/03/2013 - Left ventricle: The cavity size was normal. Wall thickness  was normal. Systolic function was normal. The estimated  ejection fraction was in the range of 55% to 60%. Wall  motion was normal; there were no regional wall motion  abnormalities. Doppler parameters are consistent with  abnormal left ventricular relaxation (grade 1 diastolic  dysfunction). Borderline increased filling pressures.  - Left atrium: The atrium was at the upper limits of normal  in size.  - Tricuspid valve: Peak RV-RA gradient: 30mm Hg (S).  - Inferior vena cava: Not visualized. Unable to estimate  CVP.  - Pericardium, extracardiac: A trivial  pericardial effusion  was identified.    MYOVIEW: 05/03/2013  EKG: Baseline tracing shows sinus rhythm at 61 with poor R-wave  progression.  Scintigraphic Data: Analysis of the raw perfusion data finds soft  tissue attenuation/breast attenuation.   Tomographic views obtained using the short axis, vertical long  axis, and horizontal long axis planes. There is a small, mild  intensity fixed apical anteroseptal defect that is consistent with  a mild degree of soft tissue attenuation. No evidence of scar or  ischemia.   Gated imaging reveals an EDV of 59, ESV of 11, T I D ratio 0.92,  and LVEF of 81% without wall motion abnormality.   IMPRESSION:  Low risk Lexiscan Myoview. There were no diagnostic ST-segment  abnormalities. Perfusion imaging is consistent with mild soft  tissue attenuation, no scar or ischemia. Normal LV size with LVEF  81% and normal T I D ratio.     Laboratory Data:   Chemistry Recent Labs  Lab 03/04/20 1549 03/05/20 0405  NA 138 137  K 3.5 2.9*  CL 99 102  CO2 26 23  GLUCOSE 88 108*  BUN 7* 8  CREATININE 0.71 0.69  CALCIUM 9.0 8.3*  GFRNONAA >60 >60  GFRAA >60 >60  ANIONGAP 13 12    Lab Results  Component Value Date   ALT 35 09/25/2019   AST 44 (H) 09/25/2019   ALKPHOS 44 09/25/2019   BILITOT 0.5 09/25/2019   Hematology Recent Labs  Lab 03/04/20 1549 03/05/20 0405  WBC 6.8 7.9  RBC 4.36 3.89  HGB 14.0 12.5  HCT 42.2 38.0  MCV 96.8 97.7  MCH 32.1 32.1  MCHC 33.2 32.9  RDW 12.2 12.6  PLT 223 190   Cardiac Enzymes High Sensitivity Troponin:   Recent Labs  Lab 03/04/20 1549 03/04/20 2010 03/05/20 0405  TROPONINIHS 4 4 5       BNPNo results for input(s): BNP, PROBNP in the last 168 hours.  DDimer No results for input(s): DDIMER in the last 168 hours. TSH:  Lab Results  Component Value Date   TSH 1.504 03/04/2020   Lipids: Lab Results  Component Value Date   CHOL 184 08/10/2008   HDL 51 08/10/2008   LDLCALC  105 (H) 08/10/2008   TRIG 138 08/10/2008   CHOLHDL 3.6 Ratio 08/10/2008   HgbA1c:No results found for: HGBA1C Magnesium:  Magnesium  Date Value Ref Range Status  03/05/2020 2.0 1.7 - 2.4 mg/dL Final    Comment:    Performed at [facility], [place] [address], [place], [place] [address]   Radiology/Studies:  DG Chest 2 View  Result Date: 03/04/2020 CLINICAL DATA:  Left-sided chest pain and shortness of breath, intermittent over the last week. EXAM: CHEST - 2 VIEW COMPARISON:  03/09/2018 FINDINGS: Heart size is normal. Mediastinal shadows are normal. The lungs are clear. The vascularity is normal. No effusions. No significant bone finding. IMPRESSION: No active cardiopulmonary disease. Electronically Signed   By: Nelson Chimes M.D.   On: 03/04/2020 16:19    Assessment and Plan:   1.  Chest pain: - She has had multiple episodes of chest pain on the upper left chest and continuous chest pain in the center of her chest for 5 days - 2 weeks with no acute ischemic ECG changes or cardiac enzyme elevations. -Follow-up on echocardiogram results -Keep n.p.o., discuss ischemic evaluation with MD - Cardiac catheterization was discussed with the patient fully. The patient understands that risks include but are not limited to stroke (1 in 1000), death (1 in 84), kidney failure [usually temporary] (1 in 500), bleeding (1 in 200), allergic reaction [possibly serious] (1 in 200).  The patient understands and is willing to proceed.   -I also discussed cardiac CT with the patient  Otherwise, per IM Principal Problem:   Atypical chest pain Active Problems:   Acquired hypothyroidism   Essential hypertension   GERD   Depression    For questions or updates, please contact [facility] Please consult [email or web address] for contact info under Cardiology/STEMI.   Signed, Sara Ferries, PA-C  03/05/2020 12:30 PM     Patient seen and examined. Agree with assessment and plan.  Mr. Daysia Vandenboom  is a 74 year old African-American female who has a history of hypertension, hyperlipidemia, GERD, hypothyroidism, breast the renal cell CA that is post right mastectomy, mild OSA not on therapy who has experienced episodes of atypical chest pain.  Over the past 5 to 6 days, the patient has noticed a constant left lateral upper pectoral inferior to her shoulder discomfort.  She notes this constantly and at times it has waxed and waned in intensity.  Oftentimes when she rubs it it does feel better.  She specifically denies any precipitation of chest discomfort with exertion.  Remotely she had undergone a nuclear perfusion study which was low risk.  Because of continued discomfort she presented to the emergency room today.  Presently, she still experiences a mild upper left pectoral chest ache.  Her ECG has not shown any ischemic changes.  Troponins are negative.  Laboratory is notable for hypokalemia with a potassium of 2.9 which will need to be repleted.  She is on HCTZ.  On exam, she is in no acute distress.  Pressure at home typically runs in the 130/80 range although last evening was 143/79.  HEENT is unremarkable.  I did not hear carotid bruits.  Lungs were clear.  She had definite tenderness to palpation in the left lateral pectoral region just below her shoulder which has been the site of her chest pain.  Rhythm was regular without ectopy.  She status post right mastectomy.  Abdomen was nontender.  She states she has a fatty liver.  There was no edema clubbing or cyanosis.  Neurologic exam  was nonfocal.  ECG shows normal sinus rhythm at 74 with left axis deviation, no ectopy and no ST-T changes.  QTc interval is slightly increased at 466 ms.  Laboratories notable for potassium 2.9, magnesium 2.0.  D-dimer 0.56 is normal with the upper limit of normal adjusted for age 8.72.  I do not believe the patient's chest pain is of ischemic etiology.  With her strong family history for CAD, risk factors and  hyperlipidemia recommend initial noninvasive assessment with coronary CTA.  Would recommend discontinuing hydrochlorothiazide and in its place initiate a different antihypertensive medication since the patient does not have leg swelling.  Consider initiating low-dose amlodipine versus low-dose ACE-I/ARB.   Troy Sine, MD, [facility] 03/05/2020 12:58 PM

## 2020-03-05 NOTE — Procedures (Signed)
Cannot do echo at this time since patient is having CT scan

## 2020-03-05 NOTE — Progress Notes (Signed)
Patient is a pleasant 74 year old female history of hypertension, hyperlipidemia, hypothyroidism, gastroesophageal reflux disease, family history of coronary artery disease (3 sisters, father) who presents to the ED with a 2-week history of left substernal chest pain intermittent in nature describes as a pressure, as a hippopotamus sitting on her chest, with radiation to the left neck with associated shortness of breath, diaphoresis.  Patient stated chest pain lasted a few minutes and then would subside and occurred at rest.  Patient does endorse shortness of breath on exertion.  Patient denies any recent surgeries.  Patient stated about 3 months ago out of the car ride to [facility].  Patient seen in the ED initial troponin is negative.  EKG with no significant ischemic changes noted.  Chest pain not reproducible.  Chest pain relieved with nitroglycerin in the ED per patient.  TSH within normal limits.  Repeat EKG, check a fasting lipid panel, check a D-dimer, check a 2D echo.  Continue aspirin statin.  K. Dur 40 mEq p.o. every 4 hours x2 doses.  Make n.p.o.  Start low-dose beta-blocker.  Nitroglycerin as needed pain.  Place on a PPI.  Make n.p.o. for now pending cardiology evaluation.  Due to multiple risk factors and concern for unstable angina will consult with cardiology for further evaluation and management.  No charge.

## 2020-03-05 NOTE — ED Provider Notes (Signed)
Attestation: Medical screening examination/treatment/procedure(s) were conducted as a shared visit with non-physician practitioner(s) and myself.  I personally evaluated the patient during the encounter.   Briefly, the patient is a 74 y.o. female with h/o Graves' disease, right-sided breast cancer status postmastectomy, r renal cancer in remission, obesity, hypertension, who presents today for evaluation of chest pain  Vitals:   03/05/20 0017 03/05/20 0032  BP: 123/72 (!) 143/79  Pulse: 66 68  Resp: 17 16  Temp:    SpO2: 99% 100%    CONSTITUTIONAL:  well-appearing, NAD NEURO:  Alert and oriented x 3, no focal deficits EYES:  pupils equal and reactive ENT/NECK:  trachea midline, no JVD CARDIO:  reg rate, reg rhythm, well-perfused PULM:  None labored breathing GI/GU:  Abdomin non-distended MSK/SPINE:  No gross deformities, no edema SKIN:  no rash, atraumatic PSYCH:  Appropriate speech and behavior   EKG Interpretation  Date/Time:  Monday March 04 2020 15:34:00 EDT Ventricular Rate:  74 PR Interval:  148 QRS Duration: 74 QT Interval:  420 QTC Calculation: 466 R Axis:   -37 Text Interpretation: Normal sinus rhythm Possible Left atrial enlargement Left axis deviation Low voltage QRS Abnormal ECG previous T wave flattening has improved Reconfirmed by Addison Lank ([id number]) on 03/05/2020 12:17:04 AM      Patient's left upper chest pain may be related to chest wall pain.  However substernal chest pain is concerning for unstable angina. EKG without acute ischemic changes, troponins negative.  Heart score of 5.  Will be admitted for ACS rule out.  Chest x-ray without evidence suggestive of pneumonia, pneumothorax, pneumomediastinum.  No abnormal contour of the mediastinum to suggest dissection. No evidence of acute injuries.    Fatima Blank, MD 03/05/20 [id number]

## 2020-03-05 NOTE — H&P (Signed)
History and Physical    PLEASE NOTE THAT DRAGON DICTATION SOFTWARE WAS USED IN THE CONSTRUCTION OF THIS NOTE.   Sara [name]:[id number] DOB: April 20, 1946 DOA: 03/04/2020  PCP: [name], MD Patient coming from: home   I have personally briefly reviewed patient's old medical records in [place]  Chief Complaint: Chest pain  HPI: Sara [name] is a 74 y.o. female with medical history significant for hypertension, hyperlipidemia, acquired hypothyroidism, GERD, who is admitted to [facility] on 03/04/2020 for further evaluation and management of presenting atypical chest pain after presenting from home to [facility] Emergency Department complaining of chest discomfort.   The patient reports 2 weeks of intermittent left-sided chest discomfort.  Scribes the pain as sharp in nature, with radiation to the left neck, but denies any radiation into the left shoulder, arm, or back.  Reports that the episodes typically last between 1 to 2 minutes up to 20 minutes, before spontaneously resolving in the absence of any nitroglycerin.  Reports that discomfort is nonexertional.  She notes associated mild shortness of breath and nausea in the absence of any cough, hemoptysis, pain.  Denies any associated diaphoresis, palpitations, dizziness, presyncope, or syncope.  Discomfort worsens with direct palpation over the left anterior chest wall.  Denies any worsening with deep inspiration and states that the pain is nonpositional.  In terms of recent trauma, the patient reports that she fell down steps at home in April prior to the onset of her chest discomfort.  Otherwise, denies any recent trauma.  Denies any recent travel or surgical procedures.  Denies any recent periods of diminished ambulatory activity.  Denies any personal history of DVT or PE.  Denies any associated recent leg swelling, erythema, or discomfort.  Denies any recent orthopnea or PND.  She is currently chest  pain-free.  Denies any subjective fever, chills, rigors, or generalized myalgias. Denies any recent headache, neck stiffness, rhinitis, rhinorrhea, sore throat, abdominal pain, diarrhea, or rash. No known COVID-19 exposures.   Of note, the patient acknowledges a history of hypertension and hyperlipidemia.  She also reports that she is a former smoker, having completely quit smoking in the late 1980s after smoking 1 pack/day over the preceding 25 to 30 years.  Denies any known history of underlying diabetes.  Denies any known family history of premature coronary artery disease.  No known history of underlying coronary artery disease.  Most recent ischemic evaluation appears to have occurred in August 2014, at which time the patient underwent a myocardial perfusion Lexiscan with echocardiogram, which showed no evidence of reversible ischemia.  She has never undergone coronary angiography.  Medical history is also notable for a history of generalized anxiety disorder, GERD, and peptic ulcer disease.     ED Course:  Vital signs in the ED were notable for the following: Temperature max 98.5; heart rate 63-74; blood pressure 113/71-140 3/79; respiratory rate 14-20; oxygen saturation 98 to 100% on room air.  Labs were notable for the following: BMP notable for sodium 138, potassium 3.5, bicarbonate 26, creatinine 0.71.  High-sensitivity troponin I x2 were both found to be 4.  TSH 1.504.  CBC notable for white blood cell count of 6800.  Chest x-ray shows no evidence of acute cardiopulmonary process, including no evidence of infiltrate, edema, effusion, or pneumothorax.  Presenting EKG, in comparison to most recent prior EKG from June 2019, shows sinus rhythm with heart rate 74, nonspecific T wave flattening in leads III and aVL, of  which T wave flattening in lead III was previously noted on June 2019 EKG; additionally, this evening's EKG shows inverted T waves in V1, which is also unchanged relative to EKG  from June 2019, no evidence of ST changes.  While in the ED, the following were administered: Aspirin 324 mg p.o. x1.    Review of Systems: As per HPI otherwise 10 point review of systems negative.   Past Medical History:  Diagnosis Date  . Allergic rhinitis due to pollen   . Anxiety   . Arthritis    "knees, back, right elbow; left shoulder; right ankle" (11/20/2014)  . Breast cancer, right breast ([place])    "DCIS; zero stage" S/P mastectomy 11/20/2014  . Cervical spondylosis without myelopathy    all over  . Cervicalgia   . Chronic lower back pain   . Constipation    takes Miralax daily  . Degeneration of cervical intervertebral disc   . Depression   . Diverticulosis of colon (without mention of hemorrhage)   . GERD (gastroesophageal reflux disease)    takes Omeprazole daily  . Graves' disease    "took a pill to correct"  . H/O hiatal hernia   . H/O urinary frequency   . Headache(784.0)    denies migraines since the 80's but has occ and takes Butalbital prn  . HTN (hypertension)    takes Metoprolol and Lisinopril daily  . Hypertension   . Insomnia    takes Ativan nigtly  . Joint pain    "all of them"  . Joint swelling    "knees, legs, ankles sometimes" (11/20/2014)  . Morbid obesity ([place])   . Numbness    LOWER LEGS  . Other postablative hypothyroidism    takes SYnthroid daily  . Palpitations   . Peptic ulcer, unspecified site, unspecified as acute or chronic, without mention of hemorrhage, perforation, or obstruction   . Primary localized osteoarthrosis, lower leg   . Pure hypercholesterolemia    takes Pravastin daily  . Recurrent UTI   . Renal cell carcinoma ([place]) 01/08/2016  . Renal mass    RIGHT  . Shortness of breath   . Sleep apnea    slight but doesn't require a CPAP (11/20/2014)  . Tendonitis of knee    bilateral    Past Surgical History:  Procedure Laterality Date  . ABDOMINAL HYSTERECTOMY    . ANTERIOR LAT LUMBAR FUSION  05/13/2012   Procedure:  ANTERIOR LATERAL LUMBAR FUSION 2 LEVELS;  Surgeon: Faythe Ghee, MD;  Location: [facility];  Service: Neurosurgery;  Laterality: Left;  Left Lumbar Three-four,Lumbar four-five Extreme Lumbar Interbody Fusion with Percutaneous Pedicle Screws  . APPENDECTOMY    . BREAST BIOPSY Left ~ 2014  . BREAST BIOPSY Right 2015  . BREAST LUMPECTOMY Left 1988  . BREAST RECONSTRUCTION WITH PLACEMENT OF TISSUE EXPANDER AND FLEX HD (ACELLULAR HYDRATED DERMIS) Right 11/20/2014  . BREAST RECONSTRUCTION WITH PLACEMENT OF TISSUE EXPANDER AND FLEX HD (ACELLULAR HYDRATED DERMIS) Right 11/20/2014   Procedure: RIGHT BREAST RECONSTRUCTION WITH PLACEMENT OF TISSUE EXPANDER AND ACELLULAR DERMA MATRIX (ACELLULAR DERMA MATRIX);  Surgeon: Crissie Reese, MD;  Location: [place];  Service: Plastics;  Laterality: Right;  . CATARACT EXTRACTION W/ INTRAOCULAR LENS  IMPLANT, BILATERAL Bilateral   . COLONOSCOPY  Sept 2008   RMR: normal rectum, left-sided diverticula, repeat in 2018  . COLONOSCOPY WITH PROPOFOL N/A 03/21/2018   Dr. Gala Romney: diverticulosis, internal grade I hemorrhoids. no future colonoscopies unless develops new symptoms  . DIAGNOSTIC LAPAROSCOPY    .  DILATION AND CURETTAGE OF UTERUS    . ESOPHAGOGASTRODUODENOSCOPY    . ESOPHAGOGASTRODUODENOSCOPY  2013   Dr. Gala Romney: Cervical esophageal web and Schatzki's ring s/p dilation, small hiatal hernia, antral and duodenal erosions likely NSAID effect, chronic duodenitis on path   . ESOPHAGOGASTRODUODENOSCOPY (EGD) WITH PROPOFOL N/A 03/21/2018   Dr. Gala Romney: mild Schatzki ring s/p dilation  . FOREIGN BODY REMOVAL Right 10/06/2013   Procedure: REMOVAL FOREIGN BODY EXTREMITY;  Surgeon: Carole Civil, MD;  Location: [facility];  Service: Orthopedics;  Laterality: Right;  . INJECTION KNEE Left 10/06/2013   Procedure: KNEE INJECTION;  Surgeon: Carole Civil, MD;  Location: [facility];  Service: Orthopedics;  Laterality: Left;  . JOINT REPLACEMENT Bilateral    knees  . KNEE ARTHROSCOPY  Right   . KNEE ARTHROSCOPY WITH LATERAL MENISECTOMY Right 10/06/2013   Procedure: KNEE ARTHROSCOPY WITH LATERAL AND MEDIAL MENISECTOMY;  Surgeon: Carole Civil, MD;  Location: [facility];  Service: Orthopedics;  Laterality: Right;  END @ 1234  . lumpectomy on pelvis     "mass of nerves"  . MALONEY DILATION N/A 03/21/2018   Procedure: Venia Minks DILATION;  Surgeon: Daneil Dolin, MD;  [facility];  Service: Endoscopy;  Laterality: N/A;  . MASTECTOMY COMPLETE / SIMPLE W/ SENTINEL NODE BIOPSY Right 11/20/2014   axillary  . REMOVAL OF TISSUE EXPANDER AND PLACEMENT OF IMPLANT Right 12/13/2014   Procedure: REMOVAL OF RIGHT BREAST TISSUE EXPANDER;  Surgeon: Crissie Reese, MD;  Location: [place];  Service: Plastics;  Laterality: Right;  . ROBOTIC ASSITED PARTIAL NEPHRECTOMY Right 01/08/2016   Procedure: XI ROBOTIC ASSITED PARTIAL NEPHRECTOMY;  Surgeon: Alexis Frock, MD;  Location: [facility];  Service: Urology;  Laterality: Right;  . SIMPLE MASTECTOMY WITH AXILLARY SENTINEL NODE BIOPSY Right 11/20/2014   Procedure: SIMPLE MASTECTOMY WITH AXILLARY SENTINEL NODE BIOPSY;  Surgeon: Erroll Luna, MD;  Location: [place];  Service: General;  Laterality: Right;  . TISSUE EXPANDER REMOVAL Right 12/13/2014   dr Harlow Mares  . TONSILLECTOMY    . TOTAL KNEE ARTHROPLASTY Right 01/24/2014   Procedure: TOTAL KNEE ARTHROPLASTY;  Surgeon: Carole Civil, MD;  Location: [facility];  Service: Orthopedics;  Laterality: Right;  . TOTAL KNEE ARTHROPLASTY Left 08/08/2014   Procedure: TOTAL KNEE ARTHROPLASTY;  Surgeon: Carole Civil, MD;  Location: [facility];  Service: Orthopedics;  Laterality: Left;  . TUBAL LIGATION    . UPPER GASTROINTESTINAL ENDOSCOPY      Social History:  reports that she quit smoking about 34 years ago. Her smoking use included cigarettes. She has a 27.00 pack-year smoking history. She has never used smokeless tobacco. She reports current alcohol use. She reports that she does not use drugs.   Allergies   Allergen Reactions  . Propoxyphene Hcl Other (See Comments)     Hallucinations  . Pollen Extract Other (See Comments)    Sneezing, congestion  . Camphor Itching  . Dust Mite Extract Itching    Sneezing. Runny nose   . Latex Dermatitis and Rash  . Molds & Smuts Itching  . Tomato Hives    Family History  Problem Relation Age of Onset  . Stroke Sister   . Osteoarthritis Sister   . Osteoarthritis Brother   . Obesity Brother   . Heart attack Sister   . Heart attack Father   . Lung cancer Maternal Grandmother        non-smoker  . Heart attack Other        2 Brothers, 3  sisters/Father died in 85's from old age  . Colon cancer Neg Hx        Mother died at 64 from blood clot, MI     Prior to Admission medications   Medication Sig Start Date End Date Taking? Authorizing Provider  acetaminophen (TYLENOL) 500 MG tablet Take 650 mg by mouth in the morning and at bedtime.    Yes [provider]  aspirin EC 81 MG tablet Take 81 mg by mouth daily. Swallow whole.   Yes [provider]  diphenhydrAMINE (BENADRYL) 50 MG capsule Take 50 mg by mouth at bedtime.   Yes [provider]  gabapentin (NEURONTIN) 600 MG tablet TAKE 1 TABLET BY MOUTH  DURING THE DAY AND 1 TABLET AT BEDTIME Patient taking differently: Take 600 mg by mouth 2 (two) times daily.  07/18/19  Yes Carole Civil, MD  hydrochlorothiazide (HYDRODIURIL) 25 MG tablet Take 25 mg by mouth daily.    Yes [provider]  levothyroxine (SYNTHROID, LEVOTHROID) 88 MCG tablet Take 88 mcg by mouth daily before breakfast.    Yes [provider]  omeprazole (PRILOSEC) 20 MG capsule Take 20 mg by mouth daily. May take a second 20 mg dose as needed for heartburn   Yes [provider]  oxybutynin (DITROPAN) 5 MG tablet Take 5 mg by mouth 2 (two) times daily.  07/31/15  Yes [provider]  pravastatin (PRAVACHOL) 40 MG tablet Take 40 mg by mouth daily.    Yes [provider]  sertraline (ZOLOFT) 50 MG tablet TAKE 1 TABLET BY MOUTH  DAILY Patient taking differently: Take 50 mg by mouth daily.  06/22/19  Yes Lockamy, Randi L, NP-C  tamoxifen (NOLVADEX) 20 MG tablet TAKE 1 TABLET BY MOUTH  DAILY Patient not taking: Reported on 03/04/2020 06/21/19   Derek Jack, MD  Ranitidine HCl (ZANTAC 75 PO) Take by mouth.    12/01/11  [provider]     Objective    Physical Exam: Vitals:   03/04/20 2347 03/05/20 0002 03/05/20 0017 03/05/20 0032  BP:  120/73 123/72 (!) 143/79  Pulse: 69 63 66 68  Resp: 17 14 17 16   Temp:      TempSrc:      SpO2: 98% 98% 99% 100%  Weight:      Height:        General: appears to be stated age; alert, oriented Skin: warm, dry, no rash Head:  AT/[place] Eyes:  PEARL b/l, EOMI Mouth:  Oral mucosa membranes appear moist, normal dentition Neck: supple; trachea midline Heart:  RRR; did not appreciate any M/R/G Chest: tenderness to palpation over anterior aspect of left chest wall; no evidence of paradoxical chest rise. Lungs: CTAB, did not appreciate any wheezes, rales, or rhonchi Abdomen: + BS; soft, ND, NT Vascular: 2+ pedal pulses b/l; 2+ radial pulses b/l Extremities: no peripheral edema, no muscle wasting Neuro: strength and sensation intact in upper and lower extremities b/l   Labs on Admission: I have personally reviewed following labs and imaging studies  CBC: Recent Labs  Lab 03/04/20 1549  WBC 6.8  HGB 14.0  HCT 42.2  MCV 96.8  PLT 119   Basic Metabolic Panel: Recent Labs  Lab 03/04/20 1549  NA 138  K 3.5  CL 99  CO2 26  GLUCOSE 88  BUN 7*  CREATININE 0.71  CALCIUM 9.0   GFR: Estimated Creatinine Clearance: 72.7 mL/min (by C-G formula based on SCr of 0.71 mg/dL). Liver Function  Tests: No results for input(s): AST, ALT, ALKPHOS, BILITOT, PROT, ALBUMIN in the last 168 hours. No results for input(s): LIPASE, AMYLASE in the last 168 hours. No results for input(s): AMMONIA in  the last 168 hours. Coagulation Profile: No results for input(s): INR, PROTIME in the last 168 hours. Cardiac Enzymes: No results for input(s): CKTOTAL, CKMB, CKMBINDEX, TROPONINI in the last 168 hours. BNP (last 3 results) No results for input(s): PROBNP in the last 8760 hours. HbA1C: No results for input(s): HGBA1C in the last 72 hours. CBG: No results for input(s): GLUCAP in the last 168 hours. Lipid Profile: No results for input(s): CHOL, HDL, LDLCALC, TRIG, CHOLHDL, LDLDIRECT in the last 72 hours. Thyroid Function Tests: Recent Labs    03/04/20 2320  TSH 1.504   Anemia Panel: No results for input(s): VITAMINB12, FOLATE, FERRITIN, TIBC, IRON, RETICCTPCT in the last 72 hours. Urine analysis:    Component Value Date/Time   COLORURINE yellow 02/06/2009 0000   APPEARANCEUR Clear 02/06/2009 0000   LABSPEC <1.005 02/06/2009 0000   PHURINE 6.0 02/06/2009 0000   HGBUR negative 02/06/2009 0000   BILIRUBINUR negative 02/06/2009 0000   UROBILINOGEN 0.2 02/06/2009 0000   NITRITE negative 02/06/2009 0000    Radiological Exams on Admission: DG Chest 2 View  Result Date: 03/04/2020 CLINICAL DATA:  Left-sided chest pain and shortness of breath, intermittent over the last week. EXAM: CHEST - 2 VIEW COMPARISON:  03/09/2018 FINDINGS: Heart size is normal. Mediastinal shadows are normal. The lungs are clear. The vascularity is normal. No effusions. No significant bone finding. IMPRESSION: No active cardiopulmonary disease. Electronically Signed   By: Nelson Chimes M.D.   On: 03/04/2020 16:19     EKG: Independently reviewed, with result as described above.    Assessment/Plan   Detrice B Verley is a 74 y.o. female with medical history significant for hypertension, hyperlipidemia, acquired hypothyroidism, GERD, who is admitted to [facility] on 03/04/2020 for further evaluation and management of presenting atypical chest pain after presenting from home to [facility] Emergency  Department complaining of chest discomfort.    Principal Problem:   Atypical chest pain Active Problems:   Acquired hypothyroidism   Essential hypertension   GERD   Depression    #) Atypical Chest Pain: 2 weeks of intermittent, nonexertional left-sided chest discomfort, in the absence of any interval nitroglycerin, which appears reproducible with palpation of the left anterior chest wall, which appears atypical for ACS; however, given the presence of some typical characteristics in this patient with multiple CAD risk factors, including hypertension, hyperlipidemia, obesity, as well as no cardiac ischemic evaluation since 2014, and a presenting heart score of 4 conveying a moderate probability of major acute cardiac event over the ensuing 6 weeks, the decision was made to admit the patient for overnight observation in order to rule out ACS.  Of note, troponin x2 have been negative, will EKG shows no evidence of acute ischemic changes, including no evidence of STEMI. CXR shows no evidence of acute cardiopulmonary process, as further described above.  The patient is currently chest pain-free.  Full dose aspirin administered in the ED this evening. Should patient subsequently rule-out for ACS, can then consider possibility of discussing case with cardiology for assistance in determining the necessity/nature of additional ischemic evaluation in the context of patient's age, gender, and risk factors.   Aside from ACS, which appears clinically less likely at the present time, differential also includes non-cardiac etiologies including musculoskeletal possibilities such as costochondritis, GI  sources including GERD vs PUD, particularly in the context of a documented history of these last 2 pathologies. Presentation is clinically less suggestive of acute PE.    Plan: trend serial troponin. Monitor on telemetry. PRN sublingual nitroglycerin. Consider prn Morphine for pain not relieved by SL NG. PRN EKG for  subsequent episodes of chest pain. Check serum Mg level and repeat BMP in the morning, Repeat CBC in the AM.  Continue home pravastatin.  GI cocktail of viscous lidocaine x1 for potential diagnostic as well as therapeutic benefits.       #) Essential hypertension: On HCTZ as her sole outpatient antihypertensive medication; bp's have been normotensive in the ED this evening.  Plan: Continue home HCTZ.  Close monitoring of ensuing blood pressure via routine vital signs.      #) Hyperlipidemia: On pravastatin as an outpatient.  Plan: Continue home statin.      #) GERD: On as needed omeprazole at home.  Plan: GI cocktail with viscous lidocaine x1 for potential diagnostic/therapeutic benefits in the setting of presenting atypical chest discomfort, as further described above.  Continue on as needed omeprazole.     #) Acquired hypothyroidism: On Synthroid as an outpatient.  TSH was checked in the ED this evening, and found to be within normal limits at 1.5.  Plan: Continue home Synthroid supplementation.     #) Depression: On Zoloft as an outpatient.  Plan: Continue home Zoloft.     #) Generalized anxiety disorder: On Zoloft as an outpatient.  Does not appear to be experiencing anxiety attack at this time, although anxiety may be playing either primary or exacerbating role regarding her presenting atypical chest discomfort.  Plan: Continue home Zoloft.  Consider as needed Ativan for diagnostic/therapeutic intervention.     #) Urinary incontinence: On oxybutynin as an outpatient.  Plan: Hold home oxybutynin to avoid the anticholinergic properties of this medication in the context of presenting chest discomfort.    DVT prophylaxis: Lovenox 40 mg subcu daily Code Status: Full code Family Communication: none Disposition Plan: Per Rounding Team Consults called: none  Admission status: Observation; med telemetry.    PLEASE NOTE THAT DRAGON DICTATION SOFTWARE WAS  USED IN THE CONSTRUCTION OF THIS NOTE.   Rhetta Mura DO [facility] Pager [phone] From [place]  03/05/2020, 2:04 AM

## 2020-03-06 ENCOUNTER — Observation Stay (HOSPITAL_BASED_OUTPATIENT_CLINIC_OR_DEPARTMENT_OTHER): Payer: Medicare Other

## 2020-03-06 DIAGNOSIS — R0789 Other chest pain: Secondary | ICD-10-CM | POA: Diagnosis not present

## 2020-03-06 DIAGNOSIS — R079 Chest pain, unspecified: Secondary | ICD-10-CM | POA: Diagnosis not present

## 2020-03-06 DIAGNOSIS — E876 Hypokalemia: Secondary | ICD-10-CM | POA: Diagnosis not present

## 2020-03-06 LAB — CBC
HCT: 36.2 % (ref 36.0–46.0)
Hemoglobin: 12.1 g/dL (ref 12.0–15.0)
MCH: 32.2 pg (ref 26.0–34.0)
MCHC: 33.4 g/dL (ref 30.0–36.0)
MCV: 96.3 fL (ref 80.0–100.0)
Platelets: 198 10*3/uL (ref 150–400)
RBC: 3.76 MIL/uL — ABNORMAL LOW (ref 3.87–5.11)
RDW: 12.7 % (ref 11.5–15.5)
WBC: 7.7 10*3/uL (ref 4.0–10.5)
nRBC: 0 % (ref 0.0–0.2)

## 2020-03-06 LAB — BASIC METABOLIC PANEL
Anion gap: 9 (ref 5–15)
BUN: 9 mg/dL (ref 8–23)
CO2: 24 mmol/L (ref 22–32)
Calcium: 8.6 mg/dL — ABNORMAL LOW (ref 8.9–10.3)
Chloride: 106 mmol/L (ref 98–111)
Creatinine, Ser: 0.72 mg/dL (ref 0.44–1.00)
GFR calc Af Amer: >60 mL/min (ref >60)
GFR calc non Af Amer: >60 mL/min (ref >60)
Glucose, Bld: 94 mg/dL (ref 70–99)
Potassium: 3.7 mmol/L (ref 3.5–5.1)
Sodium: 139 mmol/L (ref 135–145)

## 2020-03-06 LAB — ECHOCARDIOGRAM COMPLETE
Height: 66 in
Weight: 3217.6 oz

## 2020-03-06 LAB — LIPID PANEL
Cholesterol: 143 mg/dL (ref 0–200)
HDL: 54 mg/dL (ref >40)
LDL Cholesterol: 75 mg/dL (ref 0–99)
Total CHOL/HDL Ratio: 2.6 RATIO
Triglycerides: 70 mg/dL (ref <150)
VLDL: 14 mg/dL (ref 0–40)

## 2020-03-06 MED ORDER — ACETAMINOPHEN 325 MG PO TABS
650.0000 mg | ORAL_TABLET | Freq: Four times a day (QID) | ORAL | Status: DC | PRN
  Administered 2020-03-06 – 2020-03-07 (×2): 650 mg via ORAL
  Filled 2020-03-06 (×2): qty 2

## 2020-03-06 MED ORDER — AMLODIPINE BESYLATE 5 MG PO TABS
5.0000 mg | ORAL_TABLET | Freq: Every day | ORAL | Status: DC
  Administered 2020-03-07: 5 mg via ORAL
  Filled 2020-03-06: qty 1

## 2020-03-06 NOTE — Progress Notes (Addendum)
PROGRESS NOTE    Sara [name]  XVQ:[id number] DOB: [birth year] DOA: 03/04/2020 PCP: [name], MD    Brief Narrative: Sara [name] is a 74 y.o. female with medical history significant for hypertension, hyperlipidemia, acquired hypothyroidism, GERD, who is admitted to [facility] on 03/04/2020 for further evaluation and management of presenting atypical chest pain after presenting from home to [facility] Emergency Department complaining of chest discomfort.   The patient reports 2 weeks of intermittent left-sided chest discomfort.  Scribes the pain as sharp in nature, with radiation to the left neck, but denies any radiation into the left shoulder, arm, or back.  Reports that the episodes typically last between 1 to 2 minutes up to 20 minutes, before spontaneously resolving in the absence of any nitroglycerin.  Reports that discomfort is nonexertional.  She notes associated mild shortness of breath and nausea in the absence of any cough, hemoptysis, pain.  Denies any associated diaphoresis, palpitations, dizziness, presyncope, or syncope.  Discomfort worsens with direct palpation over the left anterior chest wall.  Denies any worsening with deep inspiration and states that the pain is nonpositional.  In terms of recent trauma, the patient reports that she fell down steps at home in April prior to the onset of her chest discomfort.  Otherwise, denies any recent trauma.  Denies any recent travel or surgical procedures.  Denies any recent periods of diminished ambulatory activity.  Denies any personal history of DVT or PE.  Denies any associated recent leg swelling, erythema, or discomfort.  Denies any recent orthopnea or PND.  She is currently chest pain-free.  Denies any subjective fever, chills, rigors, or generalized myalgias. Denies any recent headache, neck stiffness, rhinitis, rhinorrhea, sore throat, abdominal pain, diarrhea, or rash. No known COVID-19 exposures.   Of  note, the patient acknowledges a history of hypertension and hyperlipidemia.  She also reports that she is a former smoker, having completely quit smoking in the late 1980s after smoking 1 pack/day over the preceding 25 to 30 years.  Denies any known history of underlying diabetes.  Denies any known family history of premature coronary artery disease.  No known history of underlying coronary artery disease.  Most recent ischemic evaluation appears to have occurred in August 2014, at which time the patient underwent a myocardial perfusion Lexiscan with echocardiogram, which showed no evidence of reversible ischemia.  She has never undergone coronary angiography.  Medical history is also notable for a history of generalized anxiety disorder, GERD, and peptic ulcer disease.  Assessment & Plan:   Principal Problem:   Atypical chest pain Active Problems:   Acquired hypothyroidism   Hyperlipidemia   Essential hypertension   GERD   Depression    1 atypical chest pain-admitted with 5 days of chest pain.  EKG with no ischemic changes.  Coronary CT angiogram with no flow-limiting artery disease. Cardiology following and recommends aggressive lowering of LDL. Continue aspirin beta-blocker statin DC hydrochlorothiazide Norvasc started.  2 hypokalemia repleted K3.7 magnesium 2.0 check BMP in a.m.  3 history of essential hypertension continue Norvasc and beta-blocker blood pressure soft monitor closely.  Norvasc is recently added yesterday.  4 hypothyroidism continue Synthroid  5 depression/generalized anxiety disorder on Zoloft   Estimated body mass index is 32.46 kg/m as calculated from the following:   Height as of this encounter: 5\' 6"  (1.676 m).   Weight as of this encounter: 91.2 kg.  DVT prophylaxis: Lovenox Code Status: Full code Family Communication: None Disposition Plan:  Status is: Observation  Dispo: The patient is from:    Home           Anticipated d/c is to: Home               Anticipated d/c date is 03/07/2020              Patient currently is not medically stable to d/c.  Patient is status post coronary CT with soft blood pressure and Norvasc added as a new regime on IV fluids Consultants: Cardiology Procedures: Coronary CT Antimicrobials None Subjective: Patient has history of generalized anxiety disorder and depression She is very anxious to go home today She is agreed to go home tomorrow she feels safe being here tonight with the changes with the new medications that have added.  Objective: Vitals:   03/06/20 0018 03/06/20 0432 03/06/20 0830 03/06/20 1130  BP: 114/63 106/60 114/68 116/71  Pulse: 72 71 80 67  Resp: 15 17 15 18   Temp: 98.2 F (36.8 C) 98.1 F (36.7 C) 97.7 F (36.5 C) 98 F (36.7 C)  TempSrc: Oral Oral Oral Oral  SpO2: 96% 97% 98% 98%  Weight:  91.2 kg    Height:        Intake/Output Summary (Last 24 hours) at 03/06/2020 1631 Last data filed at 03/06/2020 1525 Gross per 24 hour  Intake 1519.93 ml  Output 500 ml  Net 1019.93 ml   Filed Weights   03/04/20 1538 03/05/20 1313 03/06/20 0432  Weight: 92.1 kg 92.3 kg 91.2 kg    Examination:  General exam: Appears calm and comfortable  Respiratory system: Clear to auscultation. Respiratory effort normal. Cardiovascular system: S1 & S2 heard, RRR. No JVD, murmurs, rubs, gallops or clicks. No pedal edema. Gastrointestinal system: Abdomen is nondistended, soft and nontender. No organomegaly or masses felt. Normal bowel sounds heard. Central nervous system: Alert and oriented. No focal neurological deficits. Extremities: Symmetric 5 x 5 power. Skin: No rashes, lesions or ulcers Psychiatry: Judgement and insight appear normal. Mood & affect appropriate.     Data Reviewed: I have personally reviewed following labs and imaging studies  CBC: Recent Labs  Lab 03/04/20 1549 03/05/20 0405 03/06/20 0420  WBC 6.8 7.9 7.7  HGB 14.0 12.5 12.1  HCT 42.2 38.0 36.2  MCV 96.8  97.7 96.3  PLT 223 190 174   Basic Metabolic Panel: Recent Labs  Lab 03/04/20 1549 03/05/20 0405 03/06/20 0420  NA 138 137 139  K 3.5 2.9* 3.7  CL 99 102 106  CO2 26 23 24   GLUCOSE 88 108* 94  BUN 7* 8 9  CREATININE 0.71 0.69 0.72  CALCIUM 9.0 8.3* 8.6*  MG  --  2.0  --    GFR: Estimated Creatinine Clearance: 72.4 mL/min (by C-G formula based on SCr of 0.72 mg/dL). Liver Function Tests: No results for input(s): AST, ALT, ALKPHOS, BILITOT, PROT, ALBUMIN in the last 168 hours. No results for input(s): LIPASE, AMYLASE in the last 168 hours. No results for input(s): AMMONIA in the last 168 hours. Coagulation Profile: No results for input(s): INR, PROTIME in the last 168 hours. Cardiac Enzymes: No results for input(s): CKTOTAL, CKMB, CKMBINDEX, TROPONINI in the last 168 hours. BNP (last 3 results) No results for input(s): PROBNP in the last 8760 hours. HbA1C: No results for input(s): HGBA1C in the last 72 hours. CBG: No results for input(s): GLUCAP in the last 168 hours. Lipid Profile: Recent Labs    03/06/20 0420  CHOL 143  HDL 54  LDLCALC 75  TRIG 70  CHOLHDL 2.6   Thyroid Function Tests: Recent Labs    03/04/20 2320  TSH 1.504   Anemia Panel: No results for input(s): VITAMINB12, FOLATE, FERRITIN, TIBC, IRON, RETICCTPCT in the last 72 hours. Sepsis Labs: No results for input(s): PROCALCITON, LATICACIDVEN in the last 168 hours.  Recent Results (from the past 240 hour(s))  SARS Coronavirus 2 by RT PCR (hospital order, performed in [facility] hospital lab) Nasopharyngeal Nasopharyngeal Swab     Status: None   Collection Time: 03/05/20  2:59 AM   Specimen: Nasopharyngeal Swab  Result Value Ref Range Status   SARS Coronavirus 2 NEGATIVE NEGATIVE Final    Comment: (NOTE) SARS-CoV-2 target nucleic acids are NOT DETECTED.  The SARS-CoV-2 RNA is generally detectable in upper and lower respiratory specimens during the acute phase of infection. The  lowest concentration of SARS-CoV-2 viral copies this assay can detect is 250 copies / mL. A negative result does not preclude SARS-CoV-2 infection and should not be used as the sole basis for treatment or other patient management decisions.  A negative result may occur with improper specimen collection / handling, submission of specimen other than nasopharyngeal swab, presence of viral mutation(s) within the areas targeted by this assay, and inadequate number of viral copies (<250 copies / mL). A negative result must be combined with clinical observations, patient history, and epidemiological information.  Fact Sheet for Patients:   StrictlyIdeas.no  Fact Sheet for Healthcare Providers: BankingDealers.co.za  This test is not yet approved or  cleared by the [place] FDA and has been authorized for detection and/or diagnosis of SARS-CoV-2 by FDA under an Emergency Use Authorization (EUA).  This EUA will remain in effect (meaning this test can be used) for the duration of the COVID-19 declaration under Section 564(b)(1) of the Act, 21 U.S.C. section 360bbb-3(b)(1), unless the authorization is terminated or revoked sooner.  Performed at [facility], [place] [address] [place], [place] [address]          Radiology Studies: CT CORONARY MORPH W/CTA COR W/SCORE W/CA W/CM &/OR WO/CM  Addendum Date: 03/05/2020   ADDENDUM REPORT: 03/05/2020 18:12 CLINICAL DATA:  74 year old with chest pain. EXAM: Cardiac/Coronary  CTA TECHNIQUE: The patient was scanned on a [employer]. FINDINGS: A 110 kV prospective scan was triggered in the descending thoracic aorta at 111 HU's. Axial non-contrast 3 mm slices were carried out through the heart. The data set was analyzed on a dedicated work station and scored using the [employer]. Gantry rotation speed was 250 msecs and collimation was .6 mm. No beta blockade and 0.8 mg of sl NTG was  given. The 3D data set was reconstructed in 5% intervals of the 67-82 % of the R-R cycle. Diastolic phases were analyzed on a dedicated work station using MPR, MIP and VRT modes. The patient received 80 cc of contrast. Aorta:  Normal size.  No calcifications.  No dissection. Aortic Valve:  Trileaflet.  No calcifications. Coronary Arteries:  Normal coronary origin.  Right dominance. RCA is a large dominant artery that gives rise to PDA and PLA. There is mid RCA calcified stenosis 50-74% (may be overestimated due to heavy calcification). Left main is a large artery that gives rise to LAD and LCX arteries. There is mid LAD calcified stenosis 0-24% that is non flow limiting. LCX is a non-dominant artery that gives rise to one large OM1 branch. There is a ramus branch. There  is no plaque. Other findings: Normal pulmonary vein drainage into the left atrium. Normal left atrial appendage without a thrombus. Normal size of the pulmonary artery. Thickened pericardium (pericardial fat). Please see radiology report for non cardiac findings. IMPRESSION: 1. Coronary calcium score of 94. This was 95 percentile for age and sex matched control. 2. Normal coronary origin with right dominance. 3. There is mid RCA calcified stenosis 50-74% (may be overestimated due to heavy calcification). Will send for CT-FFR analysis to assess for flow limitation. Await results. 4. There is mid LAD calcified stenosis 0-24% that is non flow limiting. 5. Thickened pericardium (pericardial fat). Candee Furbish, MD [facility] Electronically Signed   By: Candee Furbish MD   On: 03/05/2020 18:12   Result Date: 03/05/2020 EXAM: OVER-READ INTERPRETATION  CT CHEST The following report is an over-read performed by radiologist Dr. Abigail Miyamoto of [facility], [place] on 03/05/2020. This over-read does not include interpretation of cardiac or coronary anatomy or pathology. The coronary CTA interpretation by the cardiologist is attached. COMPARISON:  03/04/2020 chest  radiograph. FINDINGS: Vascular: Aortic atherosclerosis. No central pulmonary embolism, on this non-dedicated study. Mediastinum/Nodes: No imaged thoracic adenopathy. Fluid in the esophagus including on 01/10. Lungs/Pleura: No pleural fluid.  Clear imaged lungs. Upper Abdomen: Normal imaged portions of the liver, spleen, stomach. Musculoskeletal: Right mastectomy. No acute osseous abnormality. IMPRESSION: 1. No acute findings in the imaged extracardiac chest. 2. Esophageal fluid suggests dysmotility or gastroesophageal reflux. 3. Aortic Atherosclerosis (ICD10-I70.0). Electronically Signed: By: Abigail Miyamoto M.D. On: 03/05/2020 17:57   CT CORONARY FRACTIONAL FLOW RESERVE FLUID ANALYSIS  Result Date: 03/06/2020 EXAM: FFRCT ANALYSIS on 74 year old female with chest pain and abnormal CT coronary. FINDINGS: FFRct analysis was performed on the original cardiac CT angiogram dataset. Diagrammatic representation of the FFRct analysis is provided in a separate PDF document in PACS. This dictation was created using the PDF document and an interactive 3D model of the results. 3D model is not available in the EMR/PACS. Normal FFR range is >0.80. 1. Left Main: 0.98 2. LAD:0.97, 0.95, 0.88 3. LCX: 0.97 4. Ramus: 0.97, 0.94, 0.93 5. RCA: 0.98, 0.92, 0.89 IMPRESSION: 1. There is no flow limiting coronary artery disease by flow analysis. 2.  Recommend aggressive secondary prevention for calcified plaque. Candee Furbish, MD [facility]. Note: These examples are not recommendations of [employer] and only provided as examples of what other customers are doing. Electronically Signed   By: Candee Furbish MD   On: 03/06/2020 06:34   ECHOCARDIOGRAM COMPLETE  Result Date: 03/06/2020    ECHOCARDIOGRAM REPORT   Patient Name:   MIMI DEBELLIS Date of Exam: 03/06/2020 Medical Rec #:  [id number]         Height:       66.0 in Accession #:    [id number]        Weight:       201.1 lb Date of Birth:  1946-07-06         BSA:          2.004 m Patient Age:     24 years          BP:           114/68 mmHg Patient Gender: F                 HR:           78 bpm. Exam Location:  Inpatient Procedure: 2D Echo, Color Doppler and Cardiac Doppler Indications:  R07.9* Chest pain, unspecified  History:        Patient has prior history of Echocardiogram examinations, most                 recent 05/03/2013. Risk Factors:Hypertension, Dyslipidemia and                 Sleep Apnea.  Sonographer:    Raquel Sarna Senior RDCS Referring Phys: [id number] Malachy Moan THOMPSON  Sonographer Comments: Poor apical window, LV function obtained in short axis. IMPRESSIONS  1. Left ventricular ejection fraction, by estimation, is 60 to 65%. The left ventricle has normal function. The left ventricle has no regional wall motion abnormalities. Left ventricular diastolic parameters are consistent with Grade I diastolic dysfunction (impaired relaxation).  2. Right ventricular systolic function is normal. The right ventricular size is normal. There is normal pulmonary artery systolic pressure. The estimated right ventricular systolic pressure is 50.3 mmHg.  3. The mitral valve is normal in structure. No evidence of mitral valve regurgitation. No evidence of mitral stenosis.  4. The aortic valve is tricuspid. Aortic valve regurgitation is not visualized. Mild aortic valve sclerosis is present, with no evidence of aortic valve stenosis.  5. The inferior vena cava is normal in size with greater than 50% respiratory variability, suggesting right atrial pressure of 3 mmHg. FINDINGS  Left Ventricle: Left ventricular ejection fraction, by estimation, is 60 to 65%. The left ventricle has normal function. The left ventricle has no regional wall motion abnormalities. The left ventricular internal cavity size was normal in size. There is  no left ventricular hypertrophy. Left ventricular diastolic parameters are consistent with Grade I diastolic dysfunction (impaired relaxation). Normal left ventricular filling pressure. Right  Ventricle: The right ventricular size is normal. No increase in right ventricular wall thickness. Right ventricular systolic function is normal. There is normal pulmonary artery systolic pressure. The tricuspid regurgitant velocity is 2.28 m/s, and  with an assumed right atrial pressure of 3 mmHg, the estimated right ventricular systolic pressure is 54.6 mmHg. Left Atrium: Left atrial size was normal in size. Right Atrium: Right atrial size was normal in size. Pericardium: There is no evidence of pericardial effusion. Mitral Valve: The mitral valve is normal in structure. Normal mobility of the mitral valve leaflets. No evidence of mitral valve regurgitation. No evidence of mitral valve stenosis. Tricuspid Valve: The tricuspid valve is normal in structure. Tricuspid valve regurgitation is mild . No evidence of tricuspid stenosis. Aortic Valve: The aortic valve is tricuspid. Aortic valve regurgitation is not visualized. Mild aortic valve sclerosis is present, with no evidence of aortic valve stenosis. Pulmonic Valve: The pulmonic valve was normal in structure. Pulmonic valve regurgitation is not visualized. No evidence of pulmonic stenosis. Aorta: The aortic root is normal in size and structure. Venous: The inferior vena cava is normal in size with greater than 50% respiratory variability, suggesting right atrial pressure of 3 mmHg. IAS/Shunts: No atrial level shunt detected by color flow Doppler.  LEFT VENTRICLE PLAX 2D LVIDd:         3.90 cm  Diastology LVIDs:         2.70 cm  LV e' lateral:   7.72 cm/s LV PW:         0.90 cm  LV E/e' lateral: 10.3 LV IVS:        0.80 cm  LV e' medial:    6.42 cm/s LVOT diam:     1.90 cm  LV E/e' medial:  12.4 LV SV:  54 LV SV Index:   27 LVOT Area:     2.84 cm  RIGHT VENTRICLE RV S prime:     12.20 cm/s TAPSE (M-mode): 2.1 cm LEFT ATRIUM           Index       RIGHT ATRIUM           Index LA diam:      1.90 cm 0.95 cm/m  RA Area:     12.80 cm LA Vol (A4C): 30.9 ml 15.42  ml/m RA Volume:   30.30 ml  15.12 ml/m  AORTIC VALVE LVOT Vmax:   76.40 cm/s LVOT Vmean:  55.300 cm/s LVOT VTI:    0.192 m  AORTA Ao Root diam: 3.50 cm Ao Asc diam:  3.10 cm MITRAL VALVE                TRICUSPID VALVE MV Area (PHT): 3.17 cm     TR Peak grad:   20.8 mmHg MV Decel Time: 239 msec     TR Vmax:        228.00 cm/s MV E velocity: 79.70 cm/s MV A velocity: 101.00 cm/s  SHUNTS MV E/A ratio:  0.79         Systemic VTI:  0.19 m                             Systemic Diam: 1.90 cm Fransico Him MD Electronically signed by Fransico Him MD Signature Date/Time: 03/06/2020/12:11:29 PM    Final         Scheduled Meds: . [START ON 03/07/2020] amLODipine  5 mg Oral Daily  . aspirin EC  81 mg Oral Daily  . carvedilol  3.125 mg Oral BID WC  . enoxaparin (LOVENOX) injection  40 mg Subcutaneous Q24H  . gabapentin  600 mg Oral BID  . levothyroxine  88 mcg Oral Q0600  . pantoprazole  40 mg Oral Q0600  . pravastatin  40 mg Oral q1800  . sertraline  50 mg Oral Daily  . sodium chloride flush  3 mL Intravenous Q12H   Continuous Infusions: . sodium chloride 50 mL/hr at 03/05/20 1411     LOS: 0 days     Georgette Shell, MD  03/06/2020, 4:31 PM

## 2020-03-06 NOTE — Progress Notes (Signed)
Echocardiogram 2D Echocardiogram has been performed.  Oneal Deputy Dante Roudebush 03/06/2020, 11:30 AM

## 2020-03-06 NOTE — Care Management Obs Status (Signed)
[facility] NOTIFICATION   Patient Details  Name: Sara [name] MRN: [id number] Date of Birth: [birth year]   Medicare Observation Status Notification Given:  Yes    Carles Collet, RN 03/06/2020, 10:13 AM

## 2020-03-06 NOTE — Progress Notes (Addendum)
Progress Note  Patient Name: Sara [name] Date of Encounter: 03/06/2020  Primary Cardiologist: New, remote Dr. Johnsie Cancel, MD   Subjective   Doing well. No recurrent chest pain. CCTA with calcifications but no flow limiting lesions  Inpatient Medications    Scheduled Meds: . aspirin EC  81 mg Oral Daily  . carvedilol  3.125 mg Oral BID WC  . enoxaparin (LOVENOX) injection  40 mg Subcutaneous Q24H  . gabapentin  600 mg Oral BID  . hydrochlorothiazide  25 mg Oral Daily  . levothyroxine  88 mcg Oral Q0600  . pantoprazole  40 mg Oral Q0600  . pravastatin  40 mg Oral q1800  . sertraline  50 mg Oral Daily  . sodium chloride flush  3 mL Intravenous Q12H   Continuous Infusions: . sodium chloride 50 mL/hr at 03/05/20 1411   PRN Meds: acetaminophen, morphine injection, nitroGLYCERIN   Vital Signs    Vitals:   03/05/20 1747 03/05/20 2044 03/06/20 0018 03/06/20 0432  BP: 105/66 97/75 114/63 106/60  Pulse: 71 79 72 71  Resp: 14 18 15 17   Temp:  97.9 F (36.6 C) 98.2 F (36.8 C) 98.1 F (36.7 C)  TempSrc:  Oral Oral Oral  SpO2: 96% 96% 96% 97%  Weight:    91.2 kg  Height:        Intake/Output Summary (Last 24 hours) at 03/06/2020 0754 Last data filed at 03/06/2020 0300 Gross per 24 hour  Intake 908.94 ml  Output --  Net 908.94 ml   Filed Weights   03/04/20 1538 03/05/20 1313 03/06/20 0432  Weight: 92.1 kg 92.3 kg 91.2 kg    Physical Exam   General: Well developed, well nourished, NAD Neck: Negative for carotid bruits. No JVD Lungs:Clear to ausculation bilaterally. No wheezes, rales, or rhonchi. Breathing is unlabored. Cardiovascular: RRR with S1 S2. No murmurs Extremities: No edema. Radial pulses 2+ bilaterally Neuro: Alert and oriented. No focal deficits. No facial asymmetry. MAE spontaneously. Psych: Responds to questions appropriately with normal affect.    Labs    Chemistry Recent Labs  Lab 03/04/20 1549 03/05/20 0405 03/06/20 0420  NA 138 137  139  K 3.5 2.9* 3.7  CL 99 102 106  CO2 26 23 24   GLUCOSE 88 108* 94  BUN 7* 8 9  CREATININE 0.71 0.69 0.72  CALCIUM 9.0 8.3* 8.6*  GFRNONAA >60 >60 >60  GFRAA >60 >60 >60  ANIONGAP 13 12 9      Hematology Recent Labs  Lab 03/04/20 1549 03/05/20 0405 03/06/20 0420  WBC 6.8 7.9 7.7  RBC 4.36 3.89 3.76*  HGB 14.0 12.5 12.1  HCT 42.2 38.0 36.2  MCV 96.8 97.7 96.3  MCH 32.1 32.1 32.2  MCHC 33.2 32.9 33.4  RDW 12.2 12.6 12.7  PLT 223 190 198    Lipid Panel     Component Value Date/Time   CHOL 143 03/06/2020 0420   TRIG 70 03/06/2020 0420   HDL 54 03/06/2020 0420   CHOLHDL 2.6 03/06/2020 0420   VLDL 14 03/06/2020 0420   LDLCALC 75 03/06/2020 0420   Cardiac EnzymesNo results for input(s): TROPONINI in the last 168 hours. No results for input(s): TROPIPOC in the last 168 hours.   BNPNo results for input(s): BNP, PROBNP in the last 168 hours.   DDimer  Recent Labs  Lab 03/05/20 1232  DDIMER 0.56*     Radiology    DG Chest 2 View  Result Date: 03/04/2020 CLINICAL DATA:  Left-sided  chest pain and shortness of breath, intermittent over the last week. EXAM: CHEST - 2 VIEW COMPARISON:  03/09/2018 FINDINGS: Heart size is normal. Mediastinal shadows are normal. The lungs are clear. The vascularity is normal. No effusions. No significant bone finding. IMPRESSION: No active cardiopulmonary disease. Electronically Signed   By: Nelson Chimes M.D.   On: 03/04/2020 16:19   CT CORONARY MORPH W/CTA COR W/SCORE W/CA W/CM &/OR WO/CM  Addendum Date: 03/05/2020   ADDENDUM REPORT: 03/05/2020 18:12 CLINICAL DATA:  74 year old with chest pain. EXAM: Cardiac/Coronary  CTA TECHNIQUE: The patient was scanned on a [employer]. FINDINGS: A 110 kV prospective scan was triggered in the descending thoracic aorta at 111 HU's. Axial non-contrast 3 mm slices were carried out through the heart. The data set was analyzed on a dedicated work station and scored using the [place]. Gantry  rotation speed was 250 msecs and collimation was .6 mm. No beta blockade and 0.8 mg of sl NTG was given. The 3D data set was reconstructed in 5% intervals of the 67-82 % of the R-R cycle. Diastolic phases were analyzed on a dedicated work station using MPR, MIP and VRT modes. The patient received 80 cc of contrast. Aorta:  Normal size.  No calcifications.  No dissection. Aortic Valve:  Trileaflet.  No calcifications. Coronary Arteries:  Normal coronary origin.  Right dominance. RCA is a large dominant artery that gives rise to PDA and PLA. There is mid RCA calcified stenosis 50-74% (may be overestimated due to heavy calcification). Left main is a large artery that gives rise to LAD and LCX arteries. There is mid LAD calcified stenosis 0-24% that is non flow limiting. LCX is a non-dominant artery that gives rise to one large OM1 branch. There is a ramus branch. There is no plaque. Other findings: Normal pulmonary vein drainage into the left atrium. Normal left atrial appendage without a thrombus. Normal size of the pulmonary artery. Thickened pericardium (pericardial fat). Please see radiology report for non cardiac findings. IMPRESSION: 1. Coronary calcium score of 94. This was 63 percentile for age and sex matched control. 2. Normal coronary origin with right dominance. 3. There is mid RCA calcified stenosis 50-74% (may be overestimated due to heavy calcification). Will send for CT-FFR analysis to assess for flow limitation. Await results. 4. There is mid LAD calcified stenosis 0-24% that is non flow limiting. 5. Thickened pericardium (pericardial fat). Candee Furbish, MD [facility] Electronically Signed   By: Candee Furbish MD   On: 03/05/2020 18:12   Result Date: 03/05/2020 EXAM: OVER-READ INTERPRETATION  CT CHEST The following report is an over-read performed by radiologist Dr. Abigail Miyamoto of [facility], [place] on 03/05/2020. This over-read does not include interpretation of cardiac or coronary anatomy or pathology.  The coronary CTA interpretation by the cardiologist is attached. COMPARISON:  03/04/2020 chest radiograph. FINDINGS: Vascular: Aortic atherosclerosis. No central pulmonary embolism, on this non-dedicated study. Mediastinum/Nodes: No imaged thoracic adenopathy. Fluid in the esophagus including on 01/10. Lungs/Pleura: No pleural fluid.  Clear imaged lungs. Upper Abdomen: Normal imaged portions of the liver, spleen, stomach. Musculoskeletal: Right mastectomy. No acute osseous abnormality. IMPRESSION: 1. No acute findings in the imaged extracardiac chest. 2. Esophageal fluid suggests dysmotility or gastroesophageal reflux. 3. Aortic Atherosclerosis (ICD10-I70.0). Electronically Signed: By: Abigail Miyamoto M.D. On: 03/05/2020 17:57   CT CORONARY FRACTIONAL FLOW RESERVE FLUID ANALYSIS  Result Date: 03/06/2020 EXAM: FFRCT ANALYSIS on 74 year old female with chest pain and abnormal CT coronary.  FINDINGS: FFRct analysis was performed on the original cardiac CT angiogram dataset. Diagrammatic representation of the FFRct analysis is provided in a separate PDF document in PACS. This dictation was created using the PDF document and an interactive 3D model of the results. 3D model is not available in the EMR/PACS. Normal FFR range is >0.80. 1. Left Main: 0.98 2. LAD:0.97, 0.95, 0.88 3. LCX: 0.97 4. Ramus: 0.97, 0.94, 0.93 5. RCA: 0.98, 0.92, 0.89 IMPRESSION: 1. There is no flow limiting coronary artery disease by flow analysis. 2.  Recommend aggressive secondary prevention for calcified plaque. Candee Furbish, MD [facility]. Note: These examples are not recommendations of [employer] and only provided as examples of what other customers are doing. Electronically Signed   By: Candee Furbish MD   On: 03/06/2020 06:34   Telemetry    03/06/20 NSR- Personally Reviewed  ECG    No new tracings as of 03/06/20- Personally Reviewed  Cardiac Studies   ECHO: 05/03/2013 - Left ventricle: The cavity size was normal. Wall thickness  was  normal. Systolic function was normal. The estimated  ejection fraction was in the range of 55% to 60%. Wall  motion was normal; there were no regional wall motion  abnormalities. Doppler parameters are consistent with  abnormal left ventricular relaxation (grade 1 diastolic  dysfunction). Borderline increased filling pressures.  - Left atrium: The atrium was at the upper limits of normal  in size.  - Tricuspid valve: Peak RV-RA gradient: 75mm Hg (S).  - Inferior vena cava: Not visualized. Unable to estimate  CVP.  - Pericardium, extracardiac: A trivial pericardial effusion  was identified.    MYOVIEW: 05/03/2013  EKG: Baseline tracing shows sinus rhythm at 61 with poor R-wave  progression.   Scintigraphic Data: Analysis of the raw perfusion data finds soft  tissue attenuation/breast attenuation.   Tomographic views obtained using the short axis, vertical long  axis, and horizontal long axis planes. There is a small, mild  intensity fixed apical anteroseptal defect that is consistent with  a mild degree of soft tissue attenuation. No evidence of scar or  ischemia.   Gated imaging reveals an EDV of 59, ESV of 11, T I D ratio 0.92,  and LVEF of 81% without wall motion abnormality.   IMPRESSION:  Low risk Lexiscan Myoview. There were no diagnostic ST-segment  abnormalities. Perfusion imaging is consistent with mild soft  tissue attenuation, no scar or ischemia. Normal LV size with LVEF  81% and normal T I D ratio.   Patient Profile     74 y.o. female with a hx of nl ECHO and MV 2009 2nd atypical CP, HTN, HLD, acquired hypothyroid, GERD, IBS, OA, breast and renal cell CA, morbid obesity, mild CPAP, who is being seen today for the evaluation of chest pain at the request of Dr Grandville Silos.  Assessment & Plan    1. Chest pain: -Pt presented after multiple episodes of chest pain reported as continuous for the last 5 days>>no recurrence  -EKG with no ischemic  changes -hsT 4>>4>>5 not consistent with ACS -CCTA with no flow limiting coronary artery disease by flow analysis.Recommend aggressive secondary prevention for calcified plaque  -LDL, 75 -Continue ASA, BB, statin   2. HTN: -Stable, 106/60>114/63>97/75 -Consider d/c HCTZ (given hypokalemia on presentation) and start amlodipine if needed>>currently controlled therefore possible in the OP setting   3. Hypokalemia: -K+, 3.7 today -As above, would consider d/c HCTZ and start amlodipine if needed    Other hospital issues per primary  team: Hypothyroidism  GERD Depression   Signed, Kathyrn Drown NP-C [facility] Pager: [phone] 03/06/2020, 7:54 AM     Patient seen and examined. Agree with assessment and plan.  Coronary CTA performed late yesterday revealed a calcium score of 94, representing 74th percentile for age and sex matched control.  There was evidence for mild LAD calcific stenosis in the 0 to 24% range.  The RCA had more prominent calcified stenosis at least 50%.  FFR analysis revealed normal flow without hemodynamically significant lesion.  Hypokalemia improved from 2.9 up to 3.7 mg today, initially probably contributed by HCTZ therapy.  Recommend DC HCTZ in its place initiate amlodipine 5 mg for blood pressure control particularly with CAD and without evidence for edema. Echo just completed; await results. Plan aggressive LDL lowering to < 70. Probably can send home later today.   Troy Sine, MD, [facility] 03/06/2020 11:44 AM    For questions or updates, please contact   Please consult [email or web address] for contact info under Cardiology/STEMI.

## 2020-03-06 NOTE — Evaluation (Signed)
Physical Therapy Evaluation Patient Details Name: Sara [name] MRN: [id number] DOB: 1945/12/12 Today's Date: 03/06/2020   History of Present Illness  74 y.o. female with a hx of nl ECHO and MV 2009 2nd atypical CP, HTN, HLD, acquired hypothyroid, GERD, IBS, OA, breast and renal cell CA, morbid obesity, mild CPAP, who is being seen today for the evaluation of chest pain at the request of Dr Grandville Silos.  Clinical Impression  Pt admitted with above diagnosis. Pt was able to ambulate without device in hallway with VSS for the most part with HR from 88 to 132 bpm with improved HR with standing rest break. O2 93% on RA.  Tolerated walk well overall. Issued pt a [place] exercise program as pt reports she would like to improve her balance.  Exercise program was a standing exercise program.  Will follow while in hospital. Pt currently with functional limitations due to the deficits listed below (see PT Problem List). Pt will benefit from skilled PT to increase their independence and safety with mobility to allow discharge to the venue listed below.      Follow Up Recommendations No PT follow up;Supervision - Intermittent    Equipment Recommendations  None recommended by PT    Recommendations for Other Services       Precautions / Restrictions Precautions Precautions: Fall Restrictions Weight Bearing Restrictions: No      Mobility  Bed Mobility Overal bed mobility: Independent                Transfers Overall transfer level: Independent                  Ambulation/Gait Ambulation/Gait assistance: Supervision Gait Distance (Feet): 400 Feet Assistive device: None Gait Pattern/deviations: Step-through pattern;Decreased stride length   Gait velocity interpretation: 1.31 - 2.62 ft/sec, indicative of limited community ambulator General Gait Details: Pt did well overall with need for 2 standing rest breaks during walk due to fatigue and DOE 2/4.  Pt sats 93% on RA.  HR 88  bpm at rest and up to 132 bpm at one point during walk when she took one of the standing rest breaks.   Stairs            Wheelchair Mobility    Modified Rankin (Stroke Patients Only)       Balance Overall balance assessment: Needs assistance Sitting-balance support: No upper extremity supported;Feet supported Sitting balance-Leahy Scale: Fair     Standing balance support: No upper extremity supported;During functional activity Standing balance-Leahy Scale: Fair                               Pertinent Vitals/Pain Pain Assessment: No/denies pain    Home Living Family/patient expects to be discharged to:: Private residence Living Arrangements: Spouse/significant other Available Help at Discharge: Family;Available PRN/intermittently (husband works part time ) Type of Home: House Home Access: Stairs to enter     Home Layout: One level Home Equipment: Environmental consultant - 2 wheels;Cane - single point;Bedside commode;Grab bars - tub/shower;Grab bars - toilet;Shower seat;Wheelchair - manual      Prior Function Level of Independence: Independent;Needs assistance   Gait / Transfers Assistance Needed: Pt reports she is (I) with bed mobility skills, transfers, and short distance gait   ADL's / Homemaking Assistance Needed: pt reported some assist from husband in the past, but gorss independnece wiht ADLs prior to surgery        Hand Dominance  Dominant Hand: Right    Extremity/Trunk Assessment   Upper Extremity Assessment Upper Extremity Assessment: Defer to OT evaluation    Lower Extremity Assessment Lower Extremity Assessment: Generalized weakness    Cervical / Trunk Assessment Cervical / Trunk Assessment: Normal  Communication   Communication: No difficulties  Cognition Arousal/Alertness: Awake/alert Behavior During Therapy: WFL for tasks assessed/performed Overall Cognitive Status: Within Functional Limits for tasks assessed                                         General Comments      Exercises General Exercises - Lower Extremity Ankle Circles/Pumps: AROM;Both;10 reps;Seated Hip ABduction/ADduction: AROM;Both;10 reps;Standing Hip Flexion/Marching: AROM;Both;10 reps;Standing Mini-Sqauts: AROM;Both;10 reps;Standing Other Exercises Other Exercises: Pt also completed standing hip extension, knee extension and hip flexion with knee straight.  [place] program given - [id number]   Assessment/Plan    PT Assessment Patient needs continued PT services  PT Problem List Decreased activity tolerance;Decreased balance;Decreased mobility;Decreased knowledge of use of DME;Decreased safety awareness       PT Treatment Interventions DME instruction;Gait training;Functional mobility training;Therapeutic activities;Therapeutic exercise;Balance training;Patient/family education;Stair training    PT Goals (Current goals can be found in the Care Plan section)  Acute Rehab PT Goals Patient Stated Goal: to go home  PT Goal Formulation: With patient Time For Goal Achievement: 03/20/20 Potential to Achieve Goals: Good    Frequency Min 2X/week   Barriers to discharge        Co-evaluation               AM-PAC PT "6 Clicks" Mobility  Outcome Measure Help needed turning from your back to your side while in a flat bed without using bedrails?: None Help needed moving from lying on your back to sitting on the side of a flat bed without using bedrails?: None Help needed moving to and from a bed to a chair (including a wheelchair)?: None Help needed standing up from a chair using your arms (e.g., wheelchair or bedside chair)?: None Help needed to walk in hospital room?: A Little Help needed climbing 3-5 steps with a railing? : A Little 6 Click Score: 22    End of Session Equipment Utilized During Treatment: Gait belt Activity Tolerance: Patient tolerated treatment well Patient left: in bed;with call bell/phone within  reach Nurse Communication: Mobility status PT Visit Diagnosis: Muscle weakness (generalized) (M62.81)    Time: 2683-4196 PT Time Calculation (min) (ACUTE ONLY): 29 min   Charges:   PT Evaluation $PT Eval Moderate Complexity: 1 Mod PT Treatments $Gait Training: 8-22 mins        Juddson Cobern W,PT Acute Rehabilitation Services Pager:  [phone]  Office:  [place] 03/06/2020, 12:39 PM

## 2020-03-07 DIAGNOSIS — R0789 Other chest pain: Secondary | ICD-10-CM | POA: Diagnosis not present

## 2020-03-07 LAB — BASIC METABOLIC PANEL
Anion gap: 7 (ref 5–15)
BUN: 6 mg/dL — ABNORMAL LOW (ref 8–23)
CO2: 27 mmol/L (ref 22–32)
Calcium: 8.4 mg/dL — ABNORMAL LOW (ref 8.9–10.3)
Chloride: 105 mmol/L (ref 98–111)
Creatinine, Ser: 0.79 mg/dL (ref 0.44–1.00)
GFR calc Af Amer: >60 mL/min (ref >60)
GFR calc non Af Amer: >60 mL/min (ref >60)
Glucose, Bld: 96 mg/dL (ref 70–99)
Potassium: 3.8 mmol/L (ref 3.5–5.1)
Sodium: 139 mmol/L (ref 135–145)

## 2020-03-07 MED ORDER — AMLODIPINE BESYLATE 5 MG PO TABS: 2.5000 mg | ORAL_TABLET | Freq: Every day | ORAL | 3 refills | Status: DC

## 2020-03-07 MED ORDER — CARVEDILOL 3.125 MG PO TABS: 3.1250 mg | ORAL_TABLET | Freq: Two times a day (BID) | ORAL | 2 refills | Status: DC

## 2020-03-07 MED ORDER — ATORVASTATIN CALCIUM 20 MG PO TABS
20.0000 mg | ORAL_TABLET | Freq: Every day | ORAL | 11 refills | Status: DC
Start: 2020-03-07 — End: 2024-05-31

## 2020-03-07 NOTE — Plan of Care (Signed)
  Problem: Education: °Goal: Knowledge of General Education information will improve °Description: Including pain rating scale, medication(s)/side effects and non-pharmacologic comfort measures °Outcome: Adequate for Discharge °  °Problem: Health Behavior/Discharge Planning: °Goal: Ability to manage health-related needs will improve °Outcome: Adequate for Discharge °  °Problem: Clinical Measurements: °Goal: Ability to maintain clinical measurements within normal limits will improve °Outcome: Adequate for Discharge °  °Problem: Clinical Measurements: °Goal: Cardiovascular complication will be avoided °Outcome: Adequate for Discharge °  °

## 2020-03-07 NOTE — Discharge Summary (Signed)
Physician Discharge Summary  Sara [name] VPX:[id number] DOB: 23-Nov-1945 DOA: 03/04/2020  PCP: [name], MD  Admit date: 03/04/2020 Discharge date: 03/07/2020  Admitted From: Home Disposition: Home Recommendations for Outpatient Follow-up:  1. Follow up with PCP in 1-2 weeks 2. Please obtain BMP/CBC in one week 3. Please follow up with cardiologist 4    Check lipid panel in 3 months, I have stopped her pravastatin and started her on Lipitor 20 mg daily for aggressive reduction of LDL to less than 70.   Home Health: None Equipment/Devices none  Discharge Condition: Stable CODE STATUS full code Diet recommendation: Cardiac diet Brief/Interim Summary:Sara B Farrishis a 74 y.o.femalewith medical history significant forhypertension, hyperlipidemia, acquired hypothyroidism, GERD,who is [facility] to [facility] on 6/14/2021for further evaluation and management of presenting atypical chest painafter presenting from home to [facility] complaining of chest discomfort.  The patient reports 2 weeks of intermittent left-sided chest discomfort. Scribes the pain as sharp in nature, with radiation to the left neck, but denies any radiation into the left shoulder, arm, or back. Reports that the episodes typically last between 1 to 2 minutes up to 20 minutes, before spontaneously resolving in the absence of any nitroglycerin. Reports that discomfort is nonexertional.She notes associated mild shortness of breath and nausea in the absence of any cough, hemoptysis, pain. Denies any associated diaphoresis,palpitations,dizziness, presyncope, or syncope. Discomfort worsens with direct palpation over the left anterior chest wall.Denies any worsening with deep inspiration and states that the pain is nonpositional.In terms of recent trauma, the patient reports that she fell down steps at home in April prior to the onset of her chest discomfort.Otherwise,  denies any recent trauma. Denies any recent travel or surgical procedures. Denies any recent periods of diminished ambulatory activity. Denies any personal history of DVT or PE. Denies any associated recent leg swelling, erythema, or discomfort.Denies any recent orthopnea or PND. She is currently chest pain-free.   Denies any subjective fever, chills, rigors, or generalized myalgias. Denies any recent headache, neck stiffness, rhinitis, rhinorrhea, sore throat, abdominal pain, diarrhea, or rash. No known COVID-19 exposures.  Of note, the patient acknowledges a history of hypertension and hyperlipidemia. She also reports that she is a former smoker, having completely quit smoking in the late 1980s after smoking 1 pack/day over the preceding 25 to 30 years. Denies any known history of underlying diabetes. Denies any known family history of premature coronary artery disease.  No known history of underlying coronary artery disease. Most recent ischemic evaluation appears to have occurred in August 2014, at which time the patient underwent a myocardial perfusion Lexiscan with echocardiogram, which showed no evidence of reversible ischemia. She has never undergone coronary angiography.  Medical history is also notable for a history of generalized anxiety disorder, GERD, and peptic ulcer disease Discharge Diagnoses:  Principal Problem:   Atypical chest pain Active Problems:   Acquired hypothyroidism   Hyperlipidemia   Essential hypertension   GERD   Depression    1 atypical chest pain-admitted with 5 days of chest pain.  EKG with no ischemic changes.  Coronary CT angiogram with no flow-limiting artery disease.  Patient was seen by cardiologist  and recommended  aggressive lowering of LDL. Started on lipitor 20 mg daily Continued aspirin beta-blocker  hctz stopped started on coreg and norvasc by cardiology  2 hypokalemia-secondary to HCTZ repleted K3.8 magnesium 2.0   3  history of essential hypertension-patient was started on Norvasc and Coreg during the hospital stay  which is being continued on discharge.  HCTZ has been stopped.  4 hypothyroidism continue Synthroid  5 depression/generalized anxiety disorder on Zoloft  Estimated body mass index is 32.88 kg/m as calculated from the following:   Height as of this encounter: 5\' 6"  (1.676 m).   Weight as of this encounter: 92.4 kg.  Discharge Instructions  Discharge Instructions    Diet - low sodium heart healthy   Complete by: As directed    Increase activity slowly   Complete by: As directed      Allergies as of 03/07/2020      Reactions   Propoxyphene Hcl Other (See Comments)    Hallucinations   Pollen Extract Other (See Comments)   Sneezing, congestion   Camphor Itching   Dust Mite Extract Itching   Sneezing. Runny nose   Latex Dermatitis, Rash   Molds & Smuts Itching   Tomato Hives      Medication List    STOP taking these medications   hydrochlorothiazide 25 MG tablet Commonly known as: HYDRODIURIL   tamoxifen 20 MG tablet Commonly known as: NOLVADEX     TAKE these medications   acetaminophen 500 MG tablet Commonly known as: TYLENOL Take 650 mg by mouth in the morning and at bedtime.   amLODipine 5 MG tablet Commonly known as: NORVASC Take 0.5 tablets (2.5 mg total) by mouth daily. Start taking on: March 08, 2020   aspirin EC 81 MG tablet Take 81 mg by mouth daily. Swallow whole.   carvedilol 3.125 MG tablet Commonly known as: COREG Take 1 tablet (3.125 mg total) by mouth 2 (two) times daily with a meal.   diphenhydrAMINE 50 MG capsule Commonly known as: BENADRYL Take 50 mg by mouth at bedtime.   gabapentin 600 MG tablet Commonly known as: NEURONTIN TAKE 1 TABLET BY MOUTH  DURING THE DAY AND 1 TABLET AT BEDTIME What changed:   how much to take  how to take this  when to take this  additional instructions   levothyroxine 88 MCG tablet Commonly known as:  SYNTHROID Take 88 mcg by mouth daily before breakfast.   omeprazole 20 MG capsule Commonly known as: PRILOSEC Take 20 mg by mouth daily. May take a second 20 mg dose as needed for heartburn   oxybutynin 5 MG tablet Commonly known as: DITROPAN Take 5 mg by mouth 2 (two) times daily.   pravastatin 40 MG tablet Commonly known as: PRAVACHOL Take 40 mg by mouth daily.   sertraline 50 MG tablet Commonly known as: ZOLOFT TAKE 1 TABLET BY MOUTH  DAILY       Follow-up Information    [name], MD On 03/11/2020.   Specialty: Family Medicine Why: @2 :45pm Contact information: [address] [place] [address] [phone]        Josue Hector, MD Follow up.   Specialty: Cardiology Contact information: [address] [facility] [address] [phone]              Allergies  Allergen Reactions  . Propoxyphene Hcl Other (See Comments)     Hallucinations  . Pollen Extract Other (See Comments)    Sneezing, congestion  . Camphor Itching  . Dust Mite Extract Itching    Sneezing. Runny nose   . Latex Dermatitis and Rash  . Molds & Smuts Itching  . Tomato Hives    Consultations:  Cardiology   Procedures/Studies: DG Chest 2 View  Result Date: 03/04/2020 CLINICAL DATA:  Left-sided chest pain and shortness of breath, intermittent over the last week. EXAM: CHEST - 2 VIEW COMPARISON:  03/09/2018 FINDINGS: Heart size is normal. Mediastinal shadows are normal. The lungs are clear. The vascularity is normal. No effusions. No significant bone finding. IMPRESSION: No active cardiopulmonary disease. Electronically Signed   By: Nelson Chimes M.D.   On: 03/04/2020 16:19   CT CORONARY MORPH W/CTA COR W/SCORE W/CA W/CM &/OR WO/CM  Addendum Date: 03/05/2020   ADDENDUM REPORT: 03/05/2020 18:12 CLINICAL DATA:  74 year old with chest pain. EXAM: Cardiac/Coronary  CTA TECHNIQUE: The patient was scanned on a [employer]. FINDINGS: A 110 kV  prospective scan was triggered in the descending thoracic aorta at 111 HU's. Axial non-contrast 3 mm slices were carried out through the heart. The data set was analyzed on a dedicated work station and scored using the [place]. Gantry rotation speed was 250 msecs and collimation was .6 mm. No beta blockade and 0.8 mg of sl NTG was given. The 3D data set was reconstructed in 5% intervals of the 67-82 % of the R-R cycle. Diastolic phases were analyzed on a dedicated work station using MPR, MIP and VRT modes. The patient received 80 cc of contrast. Aorta:  Normal size.  No calcifications.  No dissection. Aortic Valve:  Trileaflet.  No calcifications. Coronary Arteries:  Normal coronary origin.  Right dominance. RCA is a large dominant artery that gives rise to PDA and PLA. There is mid RCA calcified stenosis 50-74% (may be overestimated due to heavy calcification). Left main is a large artery that gives rise to LAD and LCX arteries. There is mid LAD calcified stenosis 0-24% that is non flow limiting. LCX is a non-dominant artery that gives rise to one large OM1 branch. There is a ramus branch. There is no plaque. Other findings: Normal pulmonary vein drainage into the left atrium. Normal left atrial appendage without a thrombus. Normal size of the pulmonary artery. Thickened pericardium (pericardial fat). Please see radiology report for non cardiac findings. IMPRESSION: 1. Coronary calcium score of 94. This was 50 percentile for age and sex matched control. 2. Normal coronary origin with right dominance. 3. There is mid RCA calcified stenosis 50-74% (may be overestimated due to heavy calcification). Will send for CT-FFR analysis to assess for flow limitation. Await results. 4. There is mid LAD calcified stenosis 0-24% that is non flow limiting. 5. Thickened pericardium (pericardial fat). Candee Furbish, MD [facility] Electronically Signed   By: Candee Furbish MD   On: 03/05/2020 18:12   Result Date: 03/05/2020 EXAM:  OVER-READ INTERPRETATION  CT CHEST The following report is an over-read performed by radiologist Dr. Abigail Miyamoto of [facility], [place] on 03/05/2020. This over-read does not include interpretation of cardiac or coronary anatomy or pathology. The coronary CTA interpretation by the cardiologist is attached. COMPARISON:  03/04/2020 chest radiograph. FINDINGS: Vascular: Aortic atherosclerosis. No central pulmonary embolism, on this non-dedicated study. Mediastinum/Nodes: No imaged thoracic adenopathy. Fluid in the esophagus including on 01/10. Lungs/Pleura: No pleural fluid.  Clear imaged lungs. Upper Abdomen: Normal imaged portions of the liver, spleen, stomach. Musculoskeletal: Right mastectomy. No acute osseous abnormality. IMPRESSION: 1. No acute findings in the imaged extracardiac chest. 2. Esophageal fluid suggests dysmotility or gastroesophageal reflux. 3. Aortic Atherosclerosis (ICD10-I70.0). Electronically Signed: By: Abigail Miyamoto M.D. On: 03/05/2020 17:57   CT CORONARY FRACTIONAL FLOW RESERVE FLUID ANALYSIS  Result Date: 03/06/2020 EXAM: FFRCT ANALYSIS on 74 year old female with chest pain and abnormal CT  coronary. FINDINGS: FFRct analysis was performed on the original cardiac CT angiogram dataset. Diagrammatic representation of the FFRct analysis is provided in a separate PDF document in PACS. This dictation was created using the PDF document and an interactive 3D model of the results. 3D model is not available in the EMR/PACS. Normal FFR range is >0.80. 1. Left Main: 0.98 2. LAD:0.97, 0.95, 0.88 3. LCX: 0.97 4. Ramus: 0.97, 0.94, 0.93 5. RCA: 0.98, 0.92, 0.89 IMPRESSION: 1. There is no flow limiting coronary artery disease by flow analysis. 2.  Recommend aggressive secondary prevention for calcified plaque. Candee Furbish, MD [facility]. Note: These examples are not recommendations of [employer] and only provided as examples of what other customers are doing. Electronically Signed   By: Candee Furbish MD   On:  03/06/2020 06:34   ECHOCARDIOGRAM COMPLETE  Result Date: 03/06/2020    ECHOCARDIOGRAM REPORT   Patient Name:   Sara [name] Date of Exam: 03/06/2020 Medical Rec #:  [id number]         Height:       66.0 in Accession #:    [id number]        Weight:       201.1 lb Date of Birth:  Jul 13, 1946         BSA:          2.004 m Patient Age:    74 years          BP:           114/68 mmHg Patient Gender: F                 HR:           78 bpm. Exam Location:  Inpatient Procedure: 2D Echo, Color Doppler and Cardiac Doppler Indications:    R07.9* Chest pain, unspecified  History:        Patient has prior history of Echocardiogram examinations, most                 recent 05/03/2013. Risk Factors:Hypertension, Dyslipidemia and                 Sleep Apnea.  Sonographer:    Raquel Sarna Senior RDCS Referring Phys: [id number] Malachy Moan THOMPSON  Sonographer Comments: Poor apical window, LV function obtained in short axis. IMPRESSIONS  1. Left ventricular ejection fraction, by estimation, is 60 to 65%. The left ventricle has normal function. The left ventricle has no regional wall motion abnormalities. Left ventricular diastolic parameters are consistent with Grade I diastolic dysfunction (impaired relaxation).  2. Right ventricular systolic function is normal. The right ventricular size is normal. There is normal pulmonary artery systolic pressure. The estimated right ventricular systolic pressure is 51.7 mmHg.  3. The mitral valve is normal in structure. No evidence of mitral valve regurgitation. No evidence of mitral stenosis.  4. The aortic valve is tricuspid. Aortic valve regurgitation is not visualized. Mild aortic valve sclerosis is present, with no evidence of aortic valve stenosis.  5. The inferior vena cava is normal in size with greater than 50% respiratory variability, suggesting right atrial pressure of 3 mmHg. FINDINGS  Left Ventricle: Left ventricular ejection fraction, by estimation, is 60 to 65%. The left ventricle has normal  function. The left ventricle has no regional wall motion abnormalities. The left ventricular internal cavity size was normal in size. There is  no left ventricular hypertrophy. Left ventricular diastolic parameters are consistent with Grade I diastolic dysfunction (impaired relaxation). Normal left ventricular  filling pressure. Right Ventricle: The right ventricular size is normal. No increase in right ventricular wall thickness. Right ventricular systolic function is normal. There is normal pulmonary artery systolic pressure. The tricuspid regurgitant velocity is 2.28 m/s, and  with an assumed right atrial pressure of 3 mmHg, the estimated right ventricular systolic pressure is 96.2 mmHg. Left Atrium: Left atrial size was normal in size. Right Atrium: Right atrial size was normal in size. Pericardium: There is no evidence of pericardial effusion. Mitral Valve: The mitral valve is normal in structure. Normal mobility of the mitral valve leaflets. No evidence of mitral valve regurgitation. No evidence of mitral valve stenosis. Tricuspid Valve: The tricuspid valve is normal in structure. Tricuspid valve regurgitation is mild . No evidence of tricuspid stenosis. Aortic Valve: The aortic valve is tricuspid. Aortic valve regurgitation is not visualized. Mild aortic valve sclerosis is present, with no evidence of aortic valve stenosis. Pulmonic Valve: The pulmonic valve was normal in structure. Pulmonic valve regurgitation is not visualized. No evidence of pulmonic stenosis. Aorta: The aortic root is normal in size and structure. Venous: The inferior vena cava is normal in size with greater than 50% respiratory variability, suggesting right atrial pressure of 3 mmHg. IAS/Shunts: No atrial level shunt detected by color flow Doppler.  LEFT VENTRICLE PLAX 2D LVIDd:         3.90 cm  Diastology LVIDs:         2.70 cm  LV e' lateral:   7.72 cm/s LV PW:         0.90 cm  LV E/e' lateral: 10.3 LV IVS:        0.80 cm  LV e' medial:     6.42 cm/s LVOT diam:     1.90 cm  LV E/e' medial:  12.4 LV SV:         54 LV SV Index:   27 LVOT Area:     2.84 cm  RIGHT VENTRICLE RV S prime:     12.20 cm/s TAPSE (M-mode): 2.1 cm LEFT ATRIUM           Index       RIGHT ATRIUM           Index LA diam:      1.90 cm 0.95 cm/m  RA Area:     12.80 cm LA Vol (A4C): 30.9 ml 15.42 ml/m RA Volume:   30.30 ml  15.12 ml/m  AORTIC VALVE LVOT Vmax:   76.40 cm/s LVOT Vmean:  55.300 cm/s LVOT VTI:    0.192 m  AORTA Ao Root diam: 3.50 cm Ao Asc diam:  3.10 cm MITRAL VALVE                TRICUSPID VALVE MV Area (PHT): 3.17 cm     TR Peak grad:   20.8 mmHg MV Decel Time: 239 msec     TR Vmax:        228.00 cm/s MV E velocity: 79.70 cm/s MV A velocity: 101.00 cm/s  SHUNTS MV E/A ratio:  0.79         Systemic VTI:  0.19 m                             Systemic Diam: 1.90 cm Fransico Him MD Electronically signed by Fransico Him MD Signature Date/Time: 03/06/2020/12:11:29 PM    Final     (Echo, Carotid, EGD, Colonoscopy, ERCP)    Subjective: Patient resting  in bed no recurrent chest pain overnight anxious to go home.  Discharge Exam: Vitals:   03/07/20 0439 03/07/20 0712  BP: 107/68 128/79  Pulse: 60   Resp: 14   Temp: 97.9 F (36.6 C)   SpO2: 94%    Vitals:   03/06/20 1708 03/06/20 2021 03/07/20 0439 03/07/20 0712  BP: 124/70 124/70 107/68 128/79  Pulse: 67 67 60   Resp: 16 15 14    Temp:  98.2 F (36.8 C) 97.9 F (36.6 C)   TempSrc:  Oral Oral   SpO2: 99% 97% 94%   Weight:   92.4 kg   Height:        General: Pt is alert, awake, not in acute distress Cardiovascular: RRR, S1/S2 +, no rubs, no gallops Respiratory: CTA bilaterally, no wheezing, no rhonchi Abdominal: Soft, NT, ND, bowel sounds + Extremities: no edema, no cyanosis    The results of significant diagnostics from this hospitalization (including imaging, microbiology, ancillary and laboratory) are listed below for reference.     Microbiology: Recent Results (from the past 240  hour(s))  SARS Coronavirus 2 by RT PCR (hospital order, performed in [facility] hospital lab) Nasopharyngeal Nasopharyngeal Swab     Status: None   Collection Time: 03/05/20  2:59 AM   Specimen: Nasopharyngeal Swab  Result Value Ref Range Status   SARS Coronavirus 2 NEGATIVE NEGATIVE Final    Comment: (NOTE) SARS-CoV-2 target nucleic acids are NOT DETECTED.  The SARS-CoV-2 RNA is generally detectable in upper and lower respiratory specimens during the acute phase of infection. The lowest concentration of SARS-CoV-2 viral copies this assay can detect is 250 copies / mL. A negative result does not preclude SARS-CoV-2 infection and should not be used as the sole basis for treatment or other patient management decisions.  A negative result may occur with improper specimen collection / handling, submission of specimen other than nasopharyngeal swab, presence of viral mutation(s) within the areas targeted by this assay, and inadequate number of viral copies (<250 copies / mL). A negative result must be combined with clinical observations, patient history, and epidemiological information.  Fact Sheet for Patients:   StrictlyIdeas.no  Fact Sheet for Healthcare Providers: BankingDealers.co.za  This test is not yet approved or  cleared by the [place] FDA and has been authorized for detection and/or diagnosis of SARS-CoV-2 by FDA under an Emergency Use Authorization (EUA).  This EUA will remain in effect (meaning this test can be used) for the duration of the COVID-19 declaration under Section 564(b)(1) of the Act, 21 U.S.C. section 360bbb-3(b)(1), unless the authorization is terminated or revoked sooner.  Performed at [facility], [place] [address] [place], [place] [address]      Labs: BNP (last 3 results) No results for input(s): BNP in the last 8760 hours. Basic Metabolic Panel: Recent Labs  Lab 03/04/20 1549  03/05/20 0405 03/06/20 0420 03/07/20 0349  NA 138 137 139 139  K 3.5 2.9* 3.7 3.8  CL 99 102 106 105  CO2 26 23 24 27   GLUCOSE 88 108* 94 96  BUN 7* 8 9 6*  CREATININE 0.71 0.69 0.72 0.79  CALCIUM 9.0 8.3* 8.6* 8.4*  MG  --  2.0  --   --    Liver Function Tests: No results for input(s): AST, ALT, ALKPHOS, BILITOT, PROT, ALBUMIN in the last 168 hours. No results for input(s): LIPASE, AMYLASE in the last 168 hours. No results for input(s): AMMONIA in the last 168 hours. CBC:  Recent Labs  Lab 03/04/20 1549 03/05/20 0405 03/06/20 0420  WBC 6.8 7.9 7.7  HGB 14.0 12.5 12.1  HCT 42.2 38.0 36.2  MCV 96.8 97.7 96.3  PLT 223 190 198   Cardiac Enzymes: No results for input(s): CKTOTAL, CKMB, CKMBINDEX, TROPONINI in the last 168 hours. BNP: Invalid input(s): POCBNP CBG: No results for input(s): GLUCAP in the last 168 hours. D-Dimer Recent Labs    03/05/20 1232  DDIMER 0.56*   Hgb A1c No results for input(s): HGBA1C in the last 72 hours. Lipid Profile Recent Labs    03/06/20 0420  CHOL 143  HDL 54  LDLCALC 75  TRIG 70  CHOLHDL 2.6   Thyroid function studies Recent Labs    03/04/20 2320  TSH 1.504   Anemia work up No results for input(s): VITAMINB12, FOLATE, FERRITIN, TIBC, IRON, RETICCTPCT in the last 72 hours. Urinalysis    Component Value Date/Time   COLORURINE yellow 02/06/2009 0000   APPEARANCEUR Clear 02/06/2009 0000   LABSPEC <1.005 02/06/2009 0000   PHURINE 6.0 02/06/2009 0000   HGBUR negative 02/06/2009 0000   BILIRUBINUR negative 02/06/2009 0000   UROBILINOGEN 0.2 02/06/2009 0000   NITRITE negative 02/06/2009 0000   Sepsis Labs Invalid input(s): PROCALCITONIN,  WBC,  LACTICIDVEN Microbiology Recent Results (from the past 240 hour(s))  SARS Coronavirus 2 by RT PCR (hospital order, performed in [facility] hospital lab) Nasopharyngeal Nasopharyngeal Swab     Status: None   Collection Time: 03/05/20  2:59 AM   Specimen: Nasopharyngeal Swab   Result Value Ref Range Status   SARS Coronavirus 2 NEGATIVE NEGATIVE Final    Comment: (NOTE) SARS-CoV-2 target nucleic acids are NOT DETECTED.  The SARS-CoV-2 RNA is generally detectable in upper and lower respiratory specimens during the acute phase of infection. The lowest concentration of SARS-CoV-2 viral copies this assay can detect is 250 copies / mL. A negative result does not preclude SARS-CoV-2 infection and should not be used as the sole basis for treatment or other patient management decisions.  A negative result may occur with improper specimen collection / handling, submission of specimen other than nasopharyngeal swab, presence of viral mutation(s) within the areas targeted by this assay, and inadequate number of viral copies (<250 copies / mL). A negative result must be combined with clinical observations, patient history, and epidemiological information.  Fact Sheet for Patients:   StrictlyIdeas.no  Fact Sheet for Healthcare Providers: BankingDealers.co.za  This test is not yet approved or  cleared by the [place] FDA and has been authorized for detection and/or diagnosis of SARS-CoV-2 by FDA under an Emergency Use Authorization (EUA).  This EUA will remain in effect (meaning this test can be used) for the duration of the COVID-19 declaration under Section 564(b)(1) of the Act, 21 U.S.C. section 360bbb-3(b)(1), unless the authorization is terminated or revoked sooner.  Performed at [facility], [place] [address], [place], [place] [address]      Time coordinating discharge: 37 minutes  SIGNED:   Georgette Shell, MD  [facility] 03/07/2020, 9:26 AM Pager   If 7PM-7AM, please contact night-coverage [email or web address] Password [email or web address]

## 2020-03-07 NOTE — Progress Notes (Signed)
[facility] will sign off.   Medication Recommendations:  See yesterday's note. Consider high intensity statin  Other recommendations (labs, testing, etc):  As above Follow up as an outpatient:  Has been arranged

## 2020-03-11 DIAGNOSIS — I10 Essential (primary) hypertension: Secondary | ICD-10-CM | POA: Diagnosis not present

## 2020-03-11 DIAGNOSIS — E785 Hyperlipidemia, unspecified: Secondary | ICD-10-CM | POA: Diagnosis not present

## 2020-03-11 DIAGNOSIS — R079 Chest pain, unspecified: Secondary | ICD-10-CM | POA: Diagnosis not present

## 2020-03-22 NOTE — Progress Notes (Signed)
Cardiology Office Note   Date:  03/27/2020   ID:  Sara [name], Sara [name] 03-27-1946, MRN [id number]  PCP:  [name], MD Cardiologist:  Shelva Majestic, MD 03/05/2020 in-hospital Electrphysiologist: None Rosaria Ferries, PA-C   History of Present Illness: Sara [name] is a 74 y.o. female with a history of nl ECHO and MV 2009 2nd atypical CP, HTN, HLD, acquired hypothyroid, GERD, IBS, OA, breast and renal cell CA, morbid obesity, mild OSA ?CPAP.   Admitted 06/14-06/17/2021 with chest pain, cardiac CT w/ no sig dz. BP meds increased. K+ supplemented and HCTZ stopped  Sara [name] presents for cardiology follow up.  She ist still having some chest pain. It comes and goes. It is not exertional, she is comfortable that it is not cardiac.  She has noticed some weight gain. She has some LE edema, mostly at night. She has noticed abd swelling and tightness. Describes orthopnea, but no PND.   She has increased DOE since d/c. She has a cough, but really only coughs when she lays down.   She has no palpitations, no presyncope or syncope.    Past Medical History:  Diagnosis Date  . Allergic rhinitis due to pollen   . Anxiety   . Arthritis    "knees, back, right elbow; left shoulder; right ankle" (11/20/2014)  . Breast cancer, right breast ([place]) 2016   "DCIS; zero stage" S/P mastectomy, cancer free since then  . Cervical spondylosis without myelopathy   . Cervicalgia   . Chronic lower back pain   . Constipation    takes Miralax daily  . Degeneration of cervical intervertebral disc   . Depression   . Diverticulosis of colon (without mention of hemorrhage)   . GERD (gastroesophageal reflux disease)    takes Omeprazole daily  . Graves' disease    "took a pill to correct"  . H/O hiatal hernia   . H/O urinary frequency   . Headache(784.0)    denies migraines since the 80's but has occ and takes Butalbital prn  . HTN (hypertension)    takes Metoprolol and Lisinopril daily    . Insomnia    takes Ativan nigtly  . Joint pain    "all of them"  . Joint swelling    "knees, legs, ankles sometimes" (11/20/2014)  . Morbid obesity ([place])   . Numbness    LOWER LEGS  . Other postablative hypothyroidism    takes SYnthroid daily  . Palpitations   . Peptic ulcer, unspecified site, unspecified as acute or chronic, without mention of hemorrhage, perforation, or obstruction   . Primary localized osteoarthrosis, lower leg   . Pure hypercholesterolemia    takes Pravastin daily  . Recurrent UTI   . Renal cell carcinoma ([place]) 01/08/2016   Right renal mass s/p partial right nephrectomy, cancer free since 2017  . Shortness of breath   . Sleep apnea    slight but doesn't require a CPAP (11/20/2014)  . Tendonitis of knee    bilateral    Past Surgical History:  Procedure Laterality Date  . ABDOMINAL HYSTERECTOMY    . ANTERIOR LAT LUMBAR FUSION  05/13/2012   Procedure: ANTERIOR LATERAL LUMBAR FUSION 2 LEVELS;  Surgeon: Faythe Ghee, MD;  Location: [facility];  Service: Neurosurgery;  Laterality: Left;  Left Lumbar Three-four,Lumbar four-five Extreme Lumbar Interbody Fusion with Percutaneous Pedicle Screws  . APPENDECTOMY    . BREAST BIOPSY Left ~ 2014  . BREAST BIOPSY Right 2015  . BREAST  LUMPECTOMY Left 1988  . BREAST RECONSTRUCTION WITH PLACEMENT OF TISSUE EXPANDER AND FLEX HD (ACELLULAR HYDRATED DERMIS) Right 11/20/2014  . BREAST RECONSTRUCTION WITH PLACEMENT OF TISSUE EXPANDER AND FLEX HD (ACELLULAR HYDRATED DERMIS) Right 11/20/2014   Procedure: RIGHT BREAST RECONSTRUCTION WITH PLACEMENT OF TISSUE EXPANDER AND ACELLULAR DERMA MATRIX (ACELLULAR DERMA MATRIX);  Surgeon: Crissie Reese, MD;  Location: [facility];  Service: Plastics;  Laterality: Right;  . CATARACT EXTRACTION W/ INTRAOCULAR LENS  IMPLANT, BILATERAL Bilateral   . COLONOSCOPY  Sept 2008   RMR: normal rectum, left-sided diverticula, repeat in 2018  . COLONOSCOPY WITH PROPOFOL N/A 03/21/2018   Dr. Gala Romney: diverticulosis,  internal grade I hemorrhoids. no future colonoscopies unless develops new symptoms  . DIAGNOSTIC LAPAROSCOPY    . DILATION AND CURETTAGE OF UTERUS    . ESOPHAGOGASTRODUODENOSCOPY    . ESOPHAGOGASTRODUODENOSCOPY  2013   Dr. Gala Romney: Cervical esophageal web and Schatzki's ring s/p dilation, small hiatal hernia, antral and duodenal erosions likely NSAID effect, chronic duodenitis on path   . ESOPHAGOGASTRODUODENOSCOPY (EGD) WITH PROPOFOL N/A 03/21/2018   Dr. Gala Romney: mild Schatzki ring s/p dilation  . FOREIGN BODY REMOVAL Right 10/06/2013   Procedure: REMOVAL FOREIGN BODY EXTREMITY;  Surgeon: Carole Civil, MD;  Location: [facility];  Service: Orthopedics;  Laterality: Right;  . INJECTION KNEE Left 10/06/2013   Procedure: KNEE INJECTION;  Surgeon: Carole Civil, MD;  Location: [facility];  Service: Orthopedics;  Laterality: Left;  . JOINT REPLACEMENT Bilateral    knees  . KNEE ARTHROSCOPY Right   . KNEE ARTHROSCOPY WITH LATERAL MENISECTOMY Right 10/06/2013   Procedure: KNEE ARTHROSCOPY WITH LATERAL AND MEDIAL MENISECTOMY;  Surgeon: Carole Civil, MD;  Location: [facility];  Service: Orthopedics;  Laterality: Right;  END @ 1234  . lumpectomy on pelvis     "mass of nerves"  . MALONEY DILATION N/A 03/21/2018   Procedure: Venia Minks DILATION;  Surgeon: Daneil Dolin, MD;  Location: [facility];  Service: Endoscopy;  Laterality: N/A;  . MASTECTOMY COMPLETE / SIMPLE W/ SENTINEL NODE BIOPSY Right 11/20/2014   axillary  . REMOVAL OF TISSUE EXPANDER AND PLACEMENT OF IMPLANT Right 12/13/2014   Procedure: REMOVAL OF RIGHT BREAST TISSUE EXPANDER;  Surgeon: Crissie Reese, MD;  Location: [facility];  Service: Plastics;  Laterality: Right;  . ROBOTIC ASSITED PARTIAL NEPHRECTOMY Right 01/08/2016   Procedure: XI ROBOTIC ASSITED PARTIAL NEPHRECTOMY;  Surgeon: Alexis Frock, MD;  Location: [facility];  Service: Urology;  Laterality: Right;  . SIMPLE MASTECTOMY WITH AXILLARY SENTINEL NODE BIOPSY Right 11/20/2014   Procedure: SIMPLE  MASTECTOMY WITH AXILLARY SENTINEL NODE BIOPSY;  Surgeon: Erroll Luna, MD;  Location: [facility];  Service: General;  Laterality: Right;  . TISSUE EXPANDER REMOVAL Right 12/13/2014   dr Harlow Mares  . TONSILLECTOMY    . TOTAL KNEE ARTHROPLASTY Right 01/24/2014   Procedure: TOTAL KNEE ARTHROPLASTY;  Surgeon: Carole Civil, MD;  Location: [facility];  Service: Orthopedics;  Laterality: Right;  . TOTAL KNEE ARTHROPLASTY Left 08/08/2014   Procedure: TOTAL KNEE ARTHROPLASTY;  Surgeon: Carole Civil, MD;  Location: [facility];  Service: Orthopedics;  Laterality: Left;  . TUBAL LIGATION    . UPPER GASTROINTESTINAL ENDOSCOPY      Current Outpatient Medications  Medication Sig Dispense Refill  . acetaminophen (TYLENOL) 500 MG tablet Take 650 mg by mouth in the morning and at bedtime.     Marland Kitchen amLODipine (NORVASC) 5 MG tablet Take 0.5 tablets (2.5 mg total) by mouth daily. 30 tablet 3  .  aspirin EC 81 MG tablet Take 81 mg by mouth daily. Swallow whole.    Marland Kitchen atorvastatin (LIPITOR) 20 MG tablet Take 1 tablet (20 mg total) by mouth daily. 30 tablet 11  . carvedilol (COREG) 3.125 MG tablet Take 1 tablet (3.125 mg total) by mouth 2 (two) times daily with a meal. 60 tablet 2  . diphenhydrAMINE (BENADRYL) 50 MG capsule Take 50 mg by mouth at bedtime.    . gabapentin (NEURONTIN) 600 MG tablet TAKE 1 TABLET BY MOUTH  DURING THE DAY AND 1 TABLET AT BEDTIME (Patient taking differently: Take 600 mg by mouth 2 (two) times daily. ) 180 tablet 3  . levothyroxine (SYNTHROID, LEVOTHROID) 88 MCG tablet Take 88 mcg by mouth daily before breakfast.     . omeprazole (PRILOSEC) 20 MG capsule Take 20 mg by mouth daily. May take a second 20 mg dose as needed for heartburn    . oxybutynin (DITROPAN) 5 MG tablet Take 5 mg by mouth 2 (two) times daily.     . sertraline (ZOLOFT) 50 MG tablet TAKE 1 TABLET BY MOUTH  DAILY (Patient taking differently: Take 50 mg by mouth daily. ) 90 tablet 3   No current facility-administered medications for  this visit.    Allergies:   Propoxyphene hcl, Pollen extract, Camphor, Dust mite extract, Latex, Molds & smuts, and Tomato    Social History:  The patient  reports that she quit smoking about 34 years ago. Her smoking use included cigarettes. She has a 27.00 pack-year smoking history. She has never used smokeless tobacco. She reports current alcohol use. She reports that she does not use drugs.   Family History:  The patient's family history includes Heart attack in her father, sister, sister, and sister; Lung cancer in her maternal grandmother; Obesity in her brother; Osteoarthritis in her brother and sister; Stroke in her sister.  She indicated that her mother is deceased. She indicated that her father is deceased. She indicated that all of her three sisters are alive. She indicated that both of her brothers are alive. She indicated that her maternal grandmother is deceased. She indicated that her maternal grandfather is deceased. She indicated that her paternal grandmother is deceased. She indicated that her paternal grandfather is deceased. She indicated that her maternal aunt is deceased. She indicated that her maternal uncle is deceased. She indicated that her paternal uncle is alive. She indicated that the status of her neg hx is unknown.    ROS:  Please see the history of present illness. All other systems are reviewed and negative.    PHYSICAL EXAM: VS:  BP 122/78   Pulse 73   Ht 5\' 6"  (1.676 m)   Wt 209 lb (94.8 kg)   SpO2 97%   BMI 33.73 kg/m  , BMI Body mass index is 33.73 kg/m. GEN: Well nourished, well developed, female in no acute distress HEENT: normal for age  Neck: no JVD, no carotid bruit, no masses Cardiac: RRR; no murmur, no rubs, or gallops Respiratory:  clear to auscultation bilaterally, normal work of breathing GI: firm, nontender, nondistended, + BS MS: no deformity or atrophy; no edema; distal pulses are 2+ in all 4 extremities  Skin: warm and dry, no  rash Neuro:  Strength and sensation are intact Psych: euthymic mood, full affect   EKG:  EKG is not ordered today.  CARDIAC CTA: 03/06/2020 Aorta:  Normal size.  No calcifications.  No dissection.  Aortic Valve:  Trileaflet.  No calcifications.  Coronary Arteries:  Normal coronary origin.  Right dominance.  RCA is a large dominant artery that gives rise to PDA and PLA. There is mid RCA calcified stenosis 50-74% (may be overestimated due to heavy calcification).  Left main is a large artery that gives rise to LAD and LCX arteries.  There is mid LAD calcified stenosis 0-24% that is non flow limiting.  LCX is a non-dominant artery that gives rise to one large OM1 branch. There is a ramus branch. There is no plaque.  Other findings:  Normal pulmonary vein drainage into the left atrium.  Normal left atrial appendage without a thrombus.  Normal size of the pulmonary artery.  Thickened pericardium (pericardial fat).  Please see radiology report for non cardiac findings.  IMPRESSION: 1. Coronary calcium score of 94. This was 105 percentile for age and sex matched control.  2. Normal coronary origin with right dominance.  3. There is mid RCA calcified stenosis 50-74% (may be overestimated due to heavy calcification). Will send for CT-FFR analysis to assess for flow limitation. Await results.  4. There is mid LAD calcified stenosis 0-24% that is non flow limiting.  5. Thickened pericardium (pericardial fat).  FFR: 1. Left Main: 0.98  2. LAD:0.97, 0.95, 0.88  3. LCX: 0.97  4. Ramus: 0.97, 0.94, 0.93  5. RCA: 0.98, 0.92, 0.89  IMPRESSION: 1. There is no flow limiting coronary artery disease by flow analysis. 2.  Recommend aggressive secondary prevention for calcified plaque.  RADIOLOGY FINDINGS: Vascular: Aortic atherosclerosis. No central pulmonary embolism, on this non-dedicated study.  Mediastinum/Nodes: No imaged thoracic  adenopathy. Fluid in the esophagus including on 01/10.  Lungs/Pleura: No pleural fluid.  Clear imaged lungs.  Upper Abdomen: Normal imaged portions of the liver, spleen, stomach.  Musculoskeletal: Right mastectomy. No acute osseous abnormality.  IMPRESSION: 1. No acute findings in the imaged extracardiac chest. 2. Esophageal fluid suggests dysmotility or gastroesophageal reflux. 3. Aortic Atherosclerosis (ICD10-I70.0).   ECHO: 03/06/2020 1. Left ventricular ejection fraction, by estimation, is 60 to 65%. The  left ventricle has normal function. The left ventricle has no regional  wall motion abnormalities. Left ventricular diastolic parameters are  consistent with Grade I diastolic  dysfunction (impaired relaxation).  2. Right ventricular systolic function is normal. The right ventricular  size is normal. There is normal pulmonary artery systolic pressure. The  estimated right ventricular systolic pressure is 82.9 mmHg.  3. The mitral valve is normal in structure. No evidence of mitral valve  regurgitation. No evidence of mitral stenosis.  4. The aortic valve is tricuspid. Aortic valve regurgitation is not  visualized. Mild aortic valve sclerosis is present, with no evidence of  aortic valve stenosis.  5. The inferior vena cava is normal in size with greater than 50%  respiratory variability, suggesting right atrial pressure of 3 mmHg.   Recent Labs: 09/25/2019: ALT 35 03/04/2020: TSH 1.504 03/05/2020: Magnesium 2.0 03/06/2020: Hemoglobin 12.1; Platelets 198 03/07/2020: BUN 6; Creatinine, Ser 0.79; Potassium 3.8; Sodium 139  CBC    Component Value Date/Time   WBC 7.7 03/06/2020 0420   RBC 3.76 (L) 03/06/2020 0420   HGB 12.1 03/06/2020 0420   HCT 36.2 03/06/2020 0420   PLT 198 03/06/2020 0420   MCV 96.3 03/06/2020 0420   MCH 32.2 03/06/2020 0420   MCHC 33.4 03/06/2020 0420   RDW 12.7 03/06/2020 0420   LYMPHSABS 2.3 09/25/2019 0919   MONOABS 0.6 09/25/2019 0919    EOSABS 0.1 09/25/2019 0919   BASOSABS 0.0 09/25/2019 0919  CMP Latest Ref Rng & Units 03/07/2020 03/06/2020 03/05/2020  Glucose 70 - 99 mg/dL 96 94 108(H)  BUN 8 - 23 mg/dL 6(L) 9 8  Creatinine 0.44 - 1.00 mg/dL 0.79 0.72 0.69  Sodium 135 - 145 mmol/L 139 139 137  Potassium 3.5 - 5.1 mmol/L 3.8 3.7 2.9(L)  Chloride 98 - 111 mmol/L 105 106 102  CO2 22 - 32 mmol/L 27 24 23   Calcium 8.9 - 10.3 mg/dL 8.4(L) 8.6(L) 8.3(L)  Total Protein 6.5 - 8.1 g/dL - - -  Total Bilirubin 0.3 - 1.2 mg/dL - - -  Alkaline Phos 38 - 126 U/L - - -  AST 15 - 41 U/L - - -  ALT 0 - 44 U/L - - -     Lipid Panel Lab Results  Component Value Date   CHOL 143 03/06/2020   HDL 54 03/06/2020   LDLCALC 75 03/06/2020   TRIG 70 03/06/2020   CHOLHDL 2.6 03/06/2020      Wt Readings from Last 3 Encounters:  03/27/20 209 lb (94.8 kg)  03/07/20 203 lb 11.2 oz (92.4 kg)  10/06/19 206 lb 9.6 oz (93.7 kg)     Other studies Reviewed: Additional studies/ records that were reviewed today include: Office notes, hospital records and testing.  ASSESSMENT AND PLAN:  1.  Chest pain: - no critical CAD by cardiac CT - treat symptomatically, with Tylenol, etc.  2. Weight gain, volume overload: - her lungs are clear and no LE edema, but abd is firm and her weight is up 9 lbs since taken off the HCTZ. - describes increased DOE and orthopnea - Restart the HCTZ, follow volume status and labs  3. Hypokalemia - on HCTZ but no K+ prior to recent hospital admission.  - initial K+ was 3.5, but dropped to 2.9 the next day - the HCTZ was stopped. - with restarting the HCTZ, will need to ADD K+  4. HTN - during recent admission, HCTZ was stopped and amlodipine 2.5 mg qd started - LE edema is a frequent side effect of amlodipine - d/c the amlodipine, restart the HCTZ WITH K+ as above.  5. HLD - LDL just above target at 75 on recent check - she wishes to try dietary modifications, no med change right now - recheck at f/u     Current medicines are reviewed at length with the patient today.  The patient does not have concerns regarding medicines.  The following changes have been made:  D/c amlodipine, restart HCTZ   Labs/ tests ordered today include:   Orders Placed This Encounter  Procedures  . Lipid panel  . Basic metabolic panel     Disposition:   FU with Shelva Majestic, MD  Signed, Rosaria Ferries, PA-C  03/27/2020 7:36 PM    [place] Phone: [phone]; Fax: [phone]

## 2020-03-27 ENCOUNTER — Other Ambulatory Visit: Payer: Self-pay

## 2020-03-27 ENCOUNTER — Encounter: Payer: Self-pay | Admitting: Physician Assistant

## 2020-03-27 ENCOUNTER — Ambulatory Visit: Payer: Medicare Other | Admitting: Physician Assistant

## 2020-03-27 ENCOUNTER — Other Ambulatory Visit (HOSPITAL_COMMUNITY): Payer: Self-pay | Admitting: *Deleted

## 2020-03-27 VITALS — BP 122/78 | HR 73 | Ht 66.0 in | Wt 209.0 lb

## 2020-03-27 DIAGNOSIS — I10 Essential (primary) hypertension: Secondary | ICD-10-CM

## 2020-03-27 DIAGNOSIS — E8770 Fluid overload, unspecified: Secondary | ICD-10-CM

## 2020-03-27 DIAGNOSIS — R0789 Other chest pain: Secondary | ICD-10-CM

## 2020-03-27 DIAGNOSIS — E876 Hypokalemia: Secondary | ICD-10-CM

## 2020-03-27 DIAGNOSIS — E782 Mixed hyperlipidemia: Secondary | ICD-10-CM | POA: Diagnosis not present

## 2020-03-27 DIAGNOSIS — D0511 Intraductal carcinoma in situ of right breast: Secondary | ICD-10-CM

## 2020-03-27 MED ORDER — POTASSIUM CHLORIDE ER 10 MEQ PO TBCR
10.0000 meq | EXTENDED_RELEASE_TABLET | Freq: Every day | ORAL | 3 refills | Status: DC
Start: 2020-03-27 — End: 2021-03-18

## 2020-03-27 MED ORDER — HYDROCHLOROTHIAZIDE 25 MG PO TABS
25.0000 mg | ORAL_TABLET | Freq: Every day | ORAL | 3 refills | Status: DC
Start: 2020-03-27 — End: 2020-06-14

## 2020-03-27 NOTE — Patient Instructions (Signed)
Medication Instructions:  Discontinue Amlodipine, Start HCTZ 25mg  (1 Tablet Daily) K-Dur 61meq (1 Tablet Daily *If you need a refill on your cardiac medications before your next appointment, please call your pharmacy*   Lab Work: BMP, Lipid Panel in 1 moth If you have labs (blood work) drawn today and your tests are completely normal, you will receive your results only by:  [place] (if you have MyChart) OR  A paper copy in the mail If you have any lab test that is abnormal or we need to change your treatment, we will call you to review the results.   Testing/Procedures: None   Follow-Up: At [facility], you and your health needs are our priority.  As part of our continuing mission to provide you with exceptional heart care, we have created designated Provider Care Teams.  These Care Teams include your primary Cardiologist (physician) and Advanced Practice Providers (APPs -  Physician Assistants and Nurse Practitioners) who all work together to provide you with the care you need, when you need it.  We recommend signing up for the patient portal called "MyChart".  Sign up information is provided on this After Visit Summary.  MyChart is used to connect with patients for Virtual Visits (Telemedicine).  Patients are able to view lab/test results, encounter notes, upcoming appointments, etc.  Non-urgent messages can be sent to your provider as well.   To learn more about what you can do with MyChart, go to [email or web address].    Your next appointment:   1 month(s)  The format for your next appointment:   In Person  Provider:   Shelva Majestic, MD   Other Instructions None

## 2020-03-28 ENCOUNTER — Inpatient Hospital Stay (HOSPITAL_COMMUNITY): Payer: Medicare Other | Attending: Hematology

## 2020-03-28 DIAGNOSIS — I10 Essential (primary) hypertension: Secondary | ICD-10-CM | POA: Diagnosis not present

## 2020-03-28 DIAGNOSIS — Z8261 Family history of arthritis: Secondary | ICD-10-CM | POA: Insufficient documentation

## 2020-03-28 DIAGNOSIS — C50411 Malignant neoplasm of upper-outer quadrant of right female breast: Secondary | ICD-10-CM | POA: Insufficient documentation

## 2020-03-28 DIAGNOSIS — R519 Headache, unspecified: Secondary | ICD-10-CM | POA: Diagnosis not present

## 2020-03-28 DIAGNOSIS — E559 Vitamin D deficiency, unspecified: Secondary | ICD-10-CM | POA: Insufficient documentation

## 2020-03-28 DIAGNOSIS — Z79899 Other long term (current) drug therapy: Secondary | ICD-10-CM | POA: Insufficient documentation

## 2020-03-28 DIAGNOSIS — M7989 Other specified soft tissue disorders: Secondary | ICD-10-CM | POA: Insufficient documentation

## 2020-03-28 DIAGNOSIS — Z8349 Family history of other endocrine, nutritional and metabolic diseases: Secondary | ICD-10-CM | POA: Diagnosis not present

## 2020-03-28 DIAGNOSIS — Z87891 Personal history of nicotine dependence: Secondary | ICD-10-CM | POA: Diagnosis not present

## 2020-03-28 DIAGNOSIS — R5383 Other fatigue: Secondary | ICD-10-CM | POA: Diagnosis not present

## 2020-03-28 DIAGNOSIS — Z170 Estrogen receptor positive status [ER+]: Secondary | ICD-10-CM | POA: Insufficient documentation

## 2020-03-28 DIAGNOSIS — D0511 Intraductal carcinoma in situ of right breast: Secondary | ICD-10-CM

## 2020-03-28 DIAGNOSIS — Z801 Family history of malignant neoplasm of trachea, bronchus and lung: Secondary | ICD-10-CM | POA: Insufficient documentation

## 2020-03-28 DIAGNOSIS — Z823 Family history of stroke: Secondary | ICD-10-CM | POA: Diagnosis not present

## 2020-03-28 DIAGNOSIS — Z8249 Family history of ischemic heart disease and other diseases of the circulatory system: Secondary | ICD-10-CM | POA: Diagnosis not present

## 2020-03-28 DIAGNOSIS — C641 Malignant neoplasm of right kidney, except renal pelvis: Secondary | ICD-10-CM | POA: Diagnosis not present

## 2020-03-28 LAB — CBC WITH DIFFERENTIAL/PLATELET
Abs Immature Granulocytes: 0.01 10*3/uL (ref 0.00–0.07)
Basophils Absolute: 0 10*3/uL (ref 0.0–0.1)
Basophils Relative: 1 %
Eosinophils Absolute: 0.1 10*3/uL (ref 0.0–0.5)
Eosinophils Relative: 1 %
HCT: 40.1 % (ref 36.0–46.0)
Hemoglobin: 13 g/dL (ref 12.0–15.0)
Immature Granulocytes: 0 %
Lymphocytes Relative: 39 %
Lymphs Abs: 2.7 10*3/uL (ref 0.7–4.0)
MCH: 31.8 pg (ref 26.0–34.0)
MCHC: 32.4 g/dL (ref 30.0–36.0)
MCV: 98 fL (ref 80.0–100.0)
Monocytes Absolute: 0.6 10*3/uL (ref 0.1–1.0)
Monocytes Relative: 8 %
Neutro Abs: 3.7 10*3/uL (ref 1.7–7.7)
Neutrophils Relative %: 51 %
Platelets: 215 10*3/uL (ref 150–400)
RBC: 4.09 MIL/uL (ref 3.87–5.11)
RDW: 13.1 % (ref 11.5–15.5)
WBC: 7.1 10*3/uL (ref 4.0–10.5)
nRBC: 0 % (ref 0.0–0.2)

## 2020-03-28 LAB — COMPREHENSIVE METABOLIC PANEL
ALT: 30 U/L (ref 0–44)
AST: 32 U/L (ref 15–41)
Albumin: 3.4 g/dL — ABNORMAL LOW (ref 3.5–5.0)
Alkaline Phosphatase: 53 U/L (ref 38–126)
Anion gap: 12 (ref 5–15)
BUN: 12 mg/dL (ref 8–23)
CO2: 23 mmol/L (ref 22–32)
Calcium: 8.7 mg/dL — ABNORMAL LOW (ref 8.9–10.3)
Chloride: 105 mmol/L (ref 98–111)
Creatinine, Ser: 0.67 mg/dL (ref 0.44–1.00)
GFR calc Af Amer: >60 mL/min (ref >60)
GFR calc non Af Amer: >60 mL/min (ref >60)
Glucose, Bld: 93 mg/dL (ref 70–99)
Potassium: 4 mmol/L (ref 3.5–5.1)
Sodium: 140 mmol/L (ref 135–145)
Total Bilirubin: 0.6 mg/dL (ref 0.3–1.2)
Total Protein: 7.1 g/dL (ref 6.5–8.1)

## 2020-03-28 LAB — VITAMIN D 25 HYDROXY (VIT D DEFICIENCY, FRACTURES): Vit D, 25-Hydroxy: 24.25 ng/mL — ABNORMAL LOW (ref 30–100)

## 2020-04-04 ENCOUNTER — Inpatient Hospital Stay (HOSPITAL_BASED_OUTPATIENT_CLINIC_OR_DEPARTMENT_OTHER): Payer: Medicare Other | Admitting: Nurse Practitioner

## 2020-04-04 ENCOUNTER — Other Ambulatory Visit: Payer: Self-pay

## 2020-04-04 VITALS — BP 121/68 | HR 67 | Temp 96.9°F | Resp 18 | Wt 205.5 lb

## 2020-04-04 DIAGNOSIS — Z8349 Family history of other endocrine, nutritional and metabolic diseases: Secondary | ICD-10-CM | POA: Diagnosis not present

## 2020-04-04 DIAGNOSIS — Z170 Estrogen receptor positive status [ER+]: Secondary | ICD-10-CM | POA: Diagnosis not present

## 2020-04-04 DIAGNOSIS — Z79899 Other long term (current) drug therapy: Secondary | ICD-10-CM | POA: Diagnosis not present

## 2020-04-04 DIAGNOSIS — R5383 Other fatigue: Secondary | ICD-10-CM | POA: Diagnosis not present

## 2020-04-04 DIAGNOSIS — Z87891 Personal history of nicotine dependence: Secondary | ICD-10-CM | POA: Diagnosis not present

## 2020-04-04 DIAGNOSIS — Z801 Family history of malignant neoplasm of trachea, bronchus and lung: Secondary | ICD-10-CM | POA: Diagnosis not present

## 2020-04-04 DIAGNOSIS — C50412 Malignant neoplasm of upper-outer quadrant of left female breast: Secondary | ICD-10-CM | POA: Diagnosis not present

## 2020-04-04 DIAGNOSIS — Z823 Family history of stroke: Secondary | ICD-10-CM | POA: Diagnosis not present

## 2020-04-04 DIAGNOSIS — Z8249 Family history of ischemic heart disease and other diseases of the circulatory system: Secondary | ICD-10-CM | POA: Diagnosis not present

## 2020-04-04 DIAGNOSIS — C50411 Malignant neoplasm of upper-outer quadrant of right female breast: Secondary | ICD-10-CM

## 2020-04-04 DIAGNOSIS — R519 Headache, unspecified: Secondary | ICD-10-CM | POA: Diagnosis not present

## 2020-04-04 DIAGNOSIS — E559 Vitamin D deficiency, unspecified: Secondary | ICD-10-CM

## 2020-04-04 DIAGNOSIS — M7989 Other specified soft tissue disorders: Secondary | ICD-10-CM | POA: Diagnosis not present

## 2020-04-04 DIAGNOSIS — Z1231 Encounter for screening mammogram for malignant neoplasm of breast: Secondary | ICD-10-CM

## 2020-04-04 DIAGNOSIS — Z8261 Family history of arthritis: Secondary | ICD-10-CM | POA: Diagnosis not present

## 2020-04-04 DIAGNOSIS — C641 Malignant neoplasm of right kidney, except renal pelvis: Secondary | ICD-10-CM | POA: Diagnosis not present

## 2020-04-04 DIAGNOSIS — I10 Essential (primary) hypertension: Secondary | ICD-10-CM | POA: Diagnosis not present

## 2020-04-04 MED ORDER — ERGOCALCIFEROL 1.25 MG (50000 UT) PO CAPS: 50000.0000 [IU] | ORAL_CAPSULE | ORAL | 3 refills | Status: DC

## 2020-04-04 NOTE — Assessment & Plan Note (Signed)
1.  Right breast DCIS/LCIS: -Status post right simple mastectomy on 11/20/2014, lymph node biopsy negative.  Grade 2, 10 cm, ER/PR positive. -Tamoxifen started on 01/01/2015.  She completed 5 years.  She stopped the tamoxifen on 01/19/2020. -Last mammogram on 10/05/2019 was BI-RADS Category 1 negative. -Labs done on 03/28/2020 were all WNL. -She does report she still has some lymphedema in her right lateral chest wall. -She will follow-up in 6 months with repeat labs and mammogram.  At this point we will do yearly visits with mammograms.  2.  Right renal cell carcinoma: -She had a T1 a NX RCC which was treated with partial nephrectomy. -Last CT scan on 10/01/2017 showed no recurrence.  Hepatic steatosis was seen. -January 2021 she had repeat CT scans which was reportedly normal.  She was released by Dr. Tresa Moore  3.  Vitamin D deficiency: -Labs done on 03/28/2020 showed vitamin D level 24.25 -We will call her and the 50,000 units weekly of vitamin D.

## 2020-04-04 NOTE — Progress Notes (Signed)
[place], [place] [address]   CLINIC:  Medical Oncology/Hematology  PCP:  Sara [name], [address] [place] [address] [phone]   REASON FOR VISIT: Follow-up for breast [name]   CURRENT THERAPY: Observation  BRIEF ONCOLOGIC HISTORY:  Oncology History  Malignant neoplasm of upper outer quadrant of female breast ([place])  09/05/2014 Imaging   assymetry noted in R breast on screening mammogram   09/18/2014 Imaging   Irregular mass in the 12 o'clock location of the right breast. Tissue diagnosis is recommended.   09/18/2014 Imaging   IMPRESSION: Irregular mass in the 12 o'clock location of the right breast. Tissue diagnosis is recommended.   10/09/2014 Imaging   8 x 12 x 8 CM abnormal linear clumped enhancement throughout the majority of the central and upper right breast.   10/17/2014 Initial Biopsy   Breast, right, needle core biopsy, central - DUCTAL CARCINOMA IN SITU, MIXED PATTERNS (CRIBRIFORM, COMEDO AND PAPILLARY), INVOLVING MULTIPLE CORES, AT LEAST 9 MM IN MAXIMAL EXTENT IN ANY ONE CORE. - FOCAL LOBULAR NEOPLASIA (ALH/LCIS). - NO INVASIVE CAR   11/20/2014 Definitive Surgery   Breast, simple mastectomy, right - DUCTAL CARCINOMA IN SITU, SEE COMMENT. - LOBULAR CARCINOMA IN SITU (CONVENTIONAL AND PLEOMORPHIC TYPES). - PREVIOUS BIOPSY SITES (X2). - IN SITU CARCINOMA IS 3CM FROM NEAREST MARGIN (DEEP).   01/01/2015 -  Anti-estrogen oral therapy   Tamoxifen daily.   07/21/2016 Breast US   BIRADS 4   07/29/2016 Procedure   Ultrasound guided biopsy of right axillary mass/lymph node. No apparent complications.   07/30/2016 Pathology Results   Pathology of the right axillary lymph node biopsy revealed LYMPHOID TISSUE WITH DERMATOPATHIC CHANGE. NO METASTATIC CARCINOMA IDENTIFIED.    Renal cell carcinoma ([place])  01/08/2016 Procedure   Robotic-assisted laparoscopic right partial nephrectomy by Dr. Tresa [name].   01/08/2016 Pathology  Results   Kidney, wedge excision / partial resection, right renal mass RENAL CELL CARCINOMA, CLEAR CELL TYPE, WHO NUCLEAR GRADE 2 (2.4 CM) THE TUMOR IS CONFINED TO THE KIDNEY MARGINS OF RESECTION ARE NEGATIVE FOR TUMOR     [name] STAGING: [name] Staging Malignant neoplasm of upper outer quadrant of female breast ([place]) Staging form: Breast, AJCC 7th Edition - Clinical: Stage 0 (Tis (DCIS), N0, M0) - Signed by Sara Silversmith, MD on 10/11/2014  Renal cell carcinoma [facility]) Staging form: Kidney, AJCC 7th Edition - Clinical stage from 06/02/2016: Stage Unknown (T1a, NX, M0) - Signed by Sara Cancer, PA-C on 06/02/2016    INTERVAL HISTORY:  Ms. Fill 74 y.o. female returns for routine follow-up for breast [name].  Patient reports she is doing well since her last visit.  She stopped her antiestrogen pills at the end of April.  She is doing great since then.  She reports she still has bilateral leg swelling.  And fatigue from time to time. Denies any nausea, vomiting, or diarrhea. Denies any new pains. Had not noticed any recent bleeding such as epistaxis, hematuria or hematochezia. Denies recent chest pain on exertion, shortness of breath on minimal exertion, pre-syncopal episodes, or palpitations. Denies any numbness or tingling in hands or feet. Denies any recent fevers, infections, or recent hospitalizations. Patient reports appetite at 75% and energy level at 25%.  She is eating well maintain her weight at this time.    REVIEW OF SYSTEMS:  Review of Systems  Constitutional: Positive for fatigue.  Cardiovascular: Positive for leg swelling.  Neurological: Positive for headaches.  All  other systems reviewed and are negative.    PAST MEDICAL/SURGICAL HISTORY:  Past Medical History:  Diagnosis Date  . Allergic rhinitis due to pollen   . Anxiety   . Arthritis    "knees, back, right elbow; left shoulder; right ankle" (11/20/2014)  . Breast [name], right breast ([place]) 2016   "DCIS;  zero stage" S/P mastectomy, [name] free since then  . Cervical spondylosis without myelopathy   . Cervicalgia   . Chronic lower back pain   . Constipation    takes Miralax daily  . Degeneration of cervical intervertebral disc   . Depression   . Diverticulosis of colon (without mention of hemorrhage)   . GERD (gastroesophageal reflux disease)    takes Omeprazole daily  . Graves' disease    "took a pill to correct"  . H/O hiatal hernia   . H/O urinary frequency   . Headache(784.0)    denies migraines since the 80's but has occ and takes Butalbital prn  . HTN (hypertension)    takes Metoprolol and Lisinopril daily  . Insomnia    takes Ativan nigtly  . Joint pain    "all of them"  . Joint swelling    "knees, legs, ankles sometimes" (11/20/2014)  . Morbid obesity ([place])   . Numbness    LOWER LEGS  . Other postablative hypothyroidism    takes SYnthroid daily  . Palpitations   . Peptic ulcer, unspecified site, unspecified as acute or chronic, without mention of hemorrhage, perforation, or obstruction   . Primary localized osteoarthrosis, lower leg   . Pure hypercholesterolemia    takes Pravastin daily  . Recurrent UTI   . Renal cell carcinoma ([place]) 01/08/2016   Right renal mass s/p partial right nephrectomy, [name] free since 2017  . Shortness of breath   . Sleep apnea    slight but doesn't require a CPAP (11/20/2014)  . Tendonitis of knee    bilateral   Past Surgical History:  Procedure Laterality Date  . ABDOMINAL HYSTERECTOMY    . ANTERIOR LAT LUMBAR FUSION  05/13/2012   Procedure: ANTERIOR LATERAL LUMBAR FUSION 2 LEVELS;  Surgeon: Sara Ghee, MD;  Location: [facility];  Service: Neurosurgery;  Laterality: Left;  Left Lumbar Three-four,Lumbar four-five Extreme Lumbar Interbody Fusion with Percutaneous Pedicle Screws  . APPENDECTOMY    . BREAST BIOPSY Left ~ 2014  . BREAST BIOPSY Right 2015  . BREAST LUMPECTOMY Left 1988  . BREAST RECONSTRUCTION WITH PLACEMENT OF  TISSUE EXPANDER AND FLEX HD (ACELLULAR HYDRATED DERMIS) Right 11/20/2014  . BREAST RECONSTRUCTION WITH PLACEMENT OF TISSUE EXPANDER AND FLEX HD (ACELLULAR HYDRATED DERMIS) Right 11/20/2014   Procedure: RIGHT BREAST RECONSTRUCTION WITH PLACEMENT OF TISSUE EXPANDER AND ACELLULAR DERMA MATRIX (ACELLULAR DERMA MATRIX);  Surgeon: Crissie Reese, MD;  Location: [facility];  Service: Plastics;  Laterality: Right;  . CATARACT EXTRACTION W/ INTRAOCULAR LENS  IMPLANT, BILATERAL Bilateral   . COLONOSCOPY  Sept 2008   RMR: normal rectum, left-sided diverticula, repeat in 2018  . COLONOSCOPY WITH PROPOFOL N/A 03/21/2018   Dr. Gala Romney: diverticulosis, internal grade I hemorrhoids. no future colonoscopies unless develops new symptoms  . DIAGNOSTIC LAPAROSCOPY    . DILATION AND CURETTAGE OF UTERUS    . ESOPHAGOGASTRODUODENOSCOPY    . ESOPHAGOGASTRODUODENOSCOPY  2013   Dr. Gala Romney: Cervical esophageal web and Schatzki's ring s/p dilation, small hiatal hernia, antral and duodenal erosions likely NSAID effect, chronic duodenitis on path   . ESOPHAGOGASTRODUODENOSCOPY (EGD) WITH PROPOFOL N/A 03/21/2018   Dr.  Rourk: mild Schatzki ring s/p dilation  . FOREIGN BODY REMOVAL Right 10/06/2013   Procedure: REMOVAL FOREIGN BODY EXTREMITY;  Surgeon: Carole Civil, MD;  Location: [facility];  Service: Orthopedics;  Laterality: Right;  . INJECTION KNEE Left 10/06/2013   Procedure: KNEE INJECTION;  Surgeon: Carole Civil, MD;  Location: [facility];  Service: Orthopedics;  Laterality: Left;  . JOINT REPLACEMENT Bilateral    knees  . KNEE ARTHROSCOPY Right   . KNEE ARTHROSCOPY WITH LATERAL MENISECTOMY Right 10/06/2013   Procedure: KNEE ARTHROSCOPY WITH LATERAL AND MEDIAL MENISECTOMY;  Surgeon: Carole Civil, MD;  Location: [facility];  Service: Orthopedics;  Laterality: Right;  END @ 1234  . lumpectomy on pelvis     "mass of nerves"  . MALONEY DILATION N/A 03/21/2018   Procedure: Venia Minks DILATION;  Surgeon: Daneil Dolin, MD;  [facility];  Service: Endoscopy;  Laterality: N/A;  . MASTECTOMY COMPLETE / SIMPLE W/ SENTINEL NODE BIOPSY Right 11/20/2014   axillary  . REMOVAL OF TISSUE EXPANDER AND PLACEMENT OF IMPLANT Right 12/13/2014   Procedure: REMOVAL OF RIGHT BREAST TISSUE EXPANDER;  Surgeon: Crissie Reese, MD;  Location: [facility];  Service: Plastics;  Laterality: Right;  . ROBOTIC ASSITED PARTIAL NEPHRECTOMY Right 01/08/2016   Procedure: XI ROBOTIC ASSITED PARTIAL NEPHRECTOMY;  Surgeon: Alexis Frock, MD;  Location: [facility];  Service: Urology;  Laterality: Right;  . SIMPLE MASTECTOMY WITH AXILLARY SENTINEL NODE BIOPSY Right 11/20/2014   Procedure: SIMPLE MASTECTOMY WITH AXILLARY SENTINEL NODE BIOPSY;  Surgeon: Erroll Luna, MD;  Location: [facility];  Service: General;  Laterality: Right;  . TISSUE EXPANDER REMOVAL Right 12/13/2014   dr Harlow Mares  . TONSILLECTOMY    . TOTAL KNEE ARTHROPLASTY Right 01/24/2014   Procedure: TOTAL KNEE ARTHROPLASTY;  Surgeon: Carole Civil, MD;  Location[facility];  Service: Orthopedics;  Laterality: Right;  . TOTAL KNEE ARTHROPLASTY Left 08/08/2014   Procedure: TOTAL KNEE ARTHROPLASTY;  Surgeon: Carole Civil, MD;  Location: [facility];  Service: Orthopedics;  Laterality: Left;  . TUBAL LIGATION    . UPPER GASTROINTESTINAL ENDOSCOPY       SOCIAL HISTORY:  Social History   Socioeconomic History  . Marital status: Married    Spouse name: Milbert Coulter  . Number of children: 4  . Years of education: Not on file  . Highest education level: Not on file  Occupational History  . Occupation: disability    Employer: UNEMPLOYED    Comment: since 2008, knee surgery  Tobacco Use  . Smoking status: Former Smoker    Packs/day: 1.00    Years: 27.00    Pack years: 27.00    Types: Cigarettes    Quit date: 12/26/1985    Years since quitting: 34.2  . Smokeless tobacco: Never Used  . Tobacco comment: Quit in 1987  Substance and Sexual Activity  . Alcohol use: Yes    Alcohol/week: 0.0 standard drinks     Comment: occasional wine  . Drug use: No  . Sexual activity: Yes    Birth control/protection: Surgical  Other Topics Concern  . Not on file  Social History Narrative   Currently married for 10 years. Husband's name is Milbert Coulter. She is an excellent singer. 8 children between she and her husband. 23 grand children. 12 great grandchildren.   Social Determinants of Health   Financial Resource Strain:   . Difficulty of Paying Living Expenses:   Food Insecurity:   . Worried About Charity fundraiser in  the Last Year:   . [place] in the Last Year:   Transportation Needs:   . Film/video editor (Medical):   [place] Kitchen Lack of Transportation (Non-Medical):   Physical Activity:   . Days of Exercise per Week:   . Minutes of Exercise per Session:   Stress:   . Feeling of Stress :   Social Connections:   . Frequency of Communication with Friends and Family:   . Frequency of Social Gatherings with Friends and Family:   . Attends Religious Services:   . Active Member of Clubs or Organizations:   . Attends Archivist Meetings:   Mar[place] Kitchen Marital Status:   Intimate Partner Violence:   . Fear of Current or Ex-Partner:   . Emotionally Abused:   Mar[place] Kitchen Physically Abused:   . Sexually Abused:     FAMILY HISTORY:  Family History  Problem Relation Age of Onset  . Stroke Sister   . Heart attack Sister        In her 60s  . Osteoarthritis Sister   . Heart attack Sister        In her 37s  . Osteoarthritis Brother   . Obesity Brother   . Heart attack Sister        In her 25s  . Heart attack Father        He died at age 33  . Lung [name] Maternal Grandmother        non-smoker  . Colon [name] Neg Hx        Mother died at 24 from blood clot, MI    CURRENT MEDICATIONS:  Outpatient Encounter Medications as of 04/04/2020  Medication Sig  . acetaminophen (TYLENOL) 500 MG tablet Take 650 mg by mouth in the morning and at bedtime.   Marland Kitchen aspirin EC 81 MG tablet Take 81 mg by mouth daily. Swallow  whole.  Marland Kitchen atorvastatin (LIPITOR) 20 MG tablet Take 1 tablet (20 mg total) by mouth daily.  . carvedilol (COREG) 3.125 MG tablet Take 1 tablet (3.125 mg total) by mouth 2 (two) times daily with a meal.  . diphenhydrAMINE (BENADRYL) 50 MG capsule Take 50 mg by mouth at bedtime.  . gabapentin (NEURONTIN) 600 MG tablet TAKE 1 TABLET BY MOUTH  DURING THE DAY AND 1 TABLET AT BEDTIME (Patient taking differently: Take 600 mg by mouth 2 (two) times daily. )  . hydrochlorothiazide (HYDRODIURIL) 25 MG tablet Take 1 tablet (25 mg total) by mouth daily.  Marland Kitchen levothyroxine (SYNTHROID, LEVOTHROID) 88 MCG tablet Take 88 mcg by mouth daily before breakfast.   . omeprazole (PRILOSEC) 20 MG capsule Take 20 mg by mouth daily. May take a second 20 mg dose as needed for heartburn  . oxybutynin (DITROPAN) 5 MG tablet Take 5 mg by mouth 2 (two) times daily.   . potassium chloride (KLOR-CON) 10 MEQ tablet Take 1 tablet (10 mEq total) by mouth daily.  . sertraline (ZOLOFT) 50 MG tablet TAKE 1 TABLET BY MOUTH  DAILY (Patient taking differently: Take 50 mg by mouth daily. )  . ergocalciferol (VITAMIN D2) 1.25 MG (50000 UT) capsule Take 1 capsule (50,000 Units total) by mouth once a week.  . [DISCONTINUED] Ranitidine HCl (ZANTAC 75 PO) Take by mouth.     No facility-administered encounter medications on file as of 04/04/2020.    ALLERGIES:  Allergies  Allergen Reactions  . Propoxyphene Hcl Other (See Comments)     Hallucinations  . Pollen Extract Other (See Comments)  Sneezing, congestion  . Camphor Itching  . Dust Mite Extract Itching    Sneezing. Runny nose   . Latex Dermatitis and Rash  . Molds & Smuts Itching  . Tomato Hives     PHYSICAL EXAM:  ECOG Performance status: 1  Vitals:   04/04/20 1253  BP: 121/68  Pulse: 67  Resp: 18  Temp: (!) 96.9 F (36.1 C)  SpO2: 100%   Filed Weights   04/04/20 1253  Weight: 205 lb 7.5 oz (93.2 kg)   Physical Exam Constitutional:      Appearance: Normal  appearance. She is normal weight.  Cardiovascular:     Rate and Rhythm: Normal rate and regular rhythm.     Heart sounds: Normal heart sounds.  Pulmonary:     Effort: Pulmonary effort is normal.     Breath sounds: Normal breath sounds.  Abdominal:     General: Bowel sounds are normal.     Palpations: Abdomen is soft.  Musculoskeletal:        General: Normal range of motion.  Skin:    General: Skin is warm.  Neurological:     Mental Status: She is alert and oriented to person, place, and time. Mental status is at baseline.  Psychiatric:        Mood and Affect: Mood normal.        Behavior: Behavior normal.        Thought Content: Thought content normal.        Judgment: Judgment normal.      LABORATORY DATA:  I have reviewed the labs as listed.  CBC    Component Value Date/Time   WBC 7.1 03/28/2020 1230   RBC 4.09 03/28/2020 1230   HGB 13.0 03/28/2020 1230   HCT 40.1 03/28/2020 1230   PLT 215 03/28/2020 1230   MCV 98.0 03/28/2020 1230   MCH 31.8 03/28/2020 1230   MCHC 32.4 03/28/2020 1230   RDW 13.1 03/28/2020 1230   LYMPHSABS 2.7 03/28/2020 1230   MONOABS 0.6 03/28/2020 1230   EOSABS 0.1 03/28/2020 1230   BASOSABS 0.0 03/28/2020 1230   CMP Latest Ref Rng & Units 03/28/2020 03/07/2020 03/06/2020  Glucose 70 - 99 mg/dL 93 96 94  BUN 8 - 23 mg/dL 12 6(L) 9  Creatinine 0.44 - 1.00 mg/dL 0.67 0.79 0.72  Sodium 135 - 145 mmol/L 140 139 139  Potassium 3.5 - 5.1 mmol/L 4.0 3.8 3.7  Chloride 98 - 111 mmol/L 105 105 106  CO2 22 - 32 mmol/L 23 27 24   Calcium 8.9 - 10.3 mg/dL 8.7(L) 8.4(L) 8.6(L)  Total Protein 6.5 - 8.1 g/dL 7.1 - -  Total Bilirubin 0.3 - 1.2 mg/dL 0.6 - -  Alkaline Phos 38 - 126 U/L 53 - -  AST 15 - 41 U/L 32 - -  ALT 0 - 44 U/L 30 - -    All questions were answered to patient's stated satisfaction. Encouraged patient to call with any new concerns or questions before his next visit to the [name] center and we can certain see him sooner, if needed.      ASSESSMENT & PLAN:  Malignant neoplasm of upper outer quadrant of female breast 1.  Right breast DCIS/LCIS: -Status post right simple mastectomy on 11/20/2014, lymph node biopsy negative.  Grade 2, 10 cm, ER/PR positive. -Tamoxifen started on 01/01/2015.  She completed 5 years.  She stopped the tamoxifen on 01/19/2020. -Last mammogram on 10/05/2019 was BI-RADS Category 1 negative. -Labs done on 03/28/2020 were  all WNL. -She does report she still has some lymphedema in her right lateral chest wall. -She will follow-up in 6 months with repeat labs and mammogram.  At this point we will do yearly visits with mammograms.  2.  Right renal cell carcinoma: -She had a T1 a NX RCC which was treated with partial nephrectomy. -Last CT scan on 10/01/2017 showed no recurrence.  Hepatic steatosis was seen. -January 2021 she had repeat CT scans which was reportedly normal.  She was released by Dr. Tresa [name]  3.  Vitamin D deficiency: -Labs done on 03/28/2020 showed vitamin D level 24.25 -We will call her and the 50,000 units weekly of vitamin D.     Orders placed this encounter:  Orders Placed This Encounter  Procedures  . MM 3D SCREEN BREAST UNI LEFT  . Lactate dehydrogenase  . CBC with Differential/Platelet  . Comprehensive metabolic panel  . Vitamin B12  . VITAMIN D 25 Hydroxy (Vit-D Deficiency, Fractures)  . Folate      Francene Finders, FNP-C [place] [phone]

## 2020-04-19 DIAGNOSIS — M159 Polyosteoarthritis, unspecified: Secondary | ICD-10-CM | POA: Diagnosis not present

## 2020-04-19 DIAGNOSIS — E039 Hypothyroidism, unspecified: Secondary | ICD-10-CM | POA: Diagnosis not present

## 2020-04-19 DIAGNOSIS — I10 Essential (primary) hypertension: Secondary | ICD-10-CM | POA: Diagnosis not present

## 2020-04-19 DIAGNOSIS — E785 Hyperlipidemia, unspecified: Secondary | ICD-10-CM | POA: Diagnosis not present

## 2020-05-07 DIAGNOSIS — E039 Hypothyroidism, unspecified: Secondary | ICD-10-CM | POA: Diagnosis not present

## 2020-05-07 DIAGNOSIS — I10 Essential (primary) hypertension: Secondary | ICD-10-CM | POA: Diagnosis not present

## 2020-05-21 DIAGNOSIS — I10 Essential (primary) hypertension: Secondary | ICD-10-CM | POA: Diagnosis not present

## 2020-05-21 DIAGNOSIS — E785 Hyperlipidemia, unspecified: Secondary | ICD-10-CM | POA: Diagnosis not present

## 2020-05-21 DIAGNOSIS — E039 Hypothyroidism, unspecified: Secondary | ICD-10-CM | POA: Diagnosis not present

## 2020-05-21 DIAGNOSIS — M159 Polyosteoarthritis, unspecified: Secondary | ICD-10-CM | POA: Diagnosis not present

## 2020-06-07 ENCOUNTER — Other Ambulatory Visit: Payer: Self-pay | Admitting: Physician Assistant

## 2020-06-07 DIAGNOSIS — I10 Essential (primary) hypertension: Secondary | ICD-10-CM | POA: Diagnosis not present

## 2020-06-07 DIAGNOSIS — E782 Mixed hyperlipidemia: Secondary | ICD-10-CM | POA: Diagnosis not present

## 2020-06-07 LAB — BASIC METABOLIC PANEL WITH GFR
BUN: 12 mg/dL (ref 7–25)
CO2: 27 mmol/L (ref 20–32)
Calcium: 8.8 mg/dL (ref 8.6–10.4)
Chloride: 102 mmol/L (ref 98–110)
Creat: 0.72 mg/dL (ref 0.60–0.93)
GFR, Est African American: 96 mL/min/{1.73_m2} (ref >=60)
GFR, Est Non African American: 83 mL/min/{1.73_m2} (ref >=60)
Glucose, Bld: 98 mg/dL (ref 65–99)
Potassium: 3.9 mmol/L (ref 3.5–5.3)
Sodium: 137 mmol/L (ref 135–146)

## 2020-06-07 LAB — LIPID PANEL
Cholesterol: 141 mg/dL (ref <200)
HDL: 57 mg/dL (ref >=50)
LDL Cholesterol (Calc): 69 mg/dL (calc)
Non-HDL Cholesterol (Calc): 84 mg/dL (calc) (ref <130)
Total CHOL/HDL Ratio: 2.5 (calc) (ref <5.0)
Triglycerides: 71 mg/dL (ref <150)

## 2020-06-11 ENCOUNTER — Other Ambulatory Visit: Payer: Self-pay | Admitting: Orthopedic Surgery

## 2020-06-14 ENCOUNTER — Other Ambulatory Visit: Payer: Self-pay

## 2020-06-14 ENCOUNTER — Ambulatory Visit: Payer: Medicare Other | Admitting: Cardiovascular Disease

## 2020-06-14 ENCOUNTER — Encounter: Payer: Self-pay | Admitting: Cardiovascular Disease

## 2020-06-14 DIAGNOSIS — E785 Hyperlipidemia, unspecified: Secondary | ICD-10-CM | POA: Diagnosis not present

## 2020-06-14 DIAGNOSIS — I10 Essential (primary) hypertension: Secondary | ICD-10-CM | POA: Diagnosis not present

## 2020-06-14 DIAGNOSIS — Z853 Personal history of malignant neoplasm of breast: Secondary | ICD-10-CM

## 2020-06-14 DIAGNOSIS — I2510 Atherosclerotic heart disease of native coronary artery without angina pectoris: Secondary | ICD-10-CM

## 2020-06-14 DIAGNOSIS — Z87442 Personal history of urinary calculi: Secondary | ICD-10-CM

## 2020-06-14 DIAGNOSIS — I2584 Coronary atherosclerosis due to calcified coronary lesion: Secondary | ICD-10-CM

## 2020-06-14 DIAGNOSIS — R0789 Other chest pain: Secondary | ICD-10-CM | POA: Diagnosis not present

## 2020-06-14 MED ORDER — CARVEDILOL 3.125 MG PO TABS
3.1250 mg | ORAL_TABLET | Freq: Two times a day (BID) | ORAL | 3 refills | Status: DC
Start: 2020-06-14 — End: 2021-04-15

## 2020-06-14 MED ORDER — HYDROCHLOROTHIAZIDE 25 MG PO TABS
12.5000 mg | ORAL_TABLET | Freq: Every day | ORAL | 3 refills | Status: DC
Start: 2020-06-14 — End: 2021-05-23

## 2020-06-14 MED ORDER — CARVEDILOL 3.125 MG PO TABS: 3.1250 mg | ORAL_TABLET | Freq: Two times a day (BID) | ORAL | 3 refills | Status: DC

## 2020-06-14 NOTE — Patient Instructions (Signed)
Medication Instructions:  DECREASE HCTZ TO 12.5 MG ONCE DAILY= 1/2 OF THE 25 MG TABLET ONCE DAILY  *If you need a refill on your cardiac medications before your next appointment, please call your pharmacy*   Follow-Up: At [facility], you and your health needs are our priority.  As part of our continuing mission to provide you with exceptional heart care, we have created designated Provider Care Teams.  These Care Teams include your primary Cardiologist (physician) and Advanced Practice Providers (APPs -  Physician Assistants and Nurse Practitioners) who all work together to provide you with the care you need, when you need it.  We recommend signing up for the patient portal called "MyChart".  Sign up information is provided on this After Visit Summary.  MyChart is used to connect with patients for Virtual Visits (Telemedicine).  Patients are able to view lab/test results, encounter notes, upcoming appointments, etc.  Non-urgent messages can be sent to your provider as well.   To learn more about what you can do with MyChart, go to [email or web address].    Your next appointment:   12 month(s)  The format for your next appointment:   In Person  Provider:   You may see Shelva Majestic, MD or one of the following Advanced Practice Providers on your designated Care Team:    Almyra Deforest, PA-C  Fabian Sharp, PA-C or   Roby Lofts, [place]

## 2020-06-14 NOTE — Progress Notes (Signed)
Cardiology Office Note    Date:  06/14/2020   ID:  Corley, Kohls 1946-05-15, MRN [id number]  PCP:  [name], MD  Cardiologist:  Shelva Majestic, MD   Initial cardiology office visit with me  History of Present Illness:  Sara [name] is a 74 y.o. female who had remotely seen Dr. Johnsie Cancel in 2014.  She has a history of hypertension, hyperlipidemia, GERD, hypothyroidism, breast and renal cell CA and is status post right mastectomy.  She has a history of mild obstructive sleep apnea not on therapy and has experienced episodes of atypical chest pain.  She was admitted to the hospital in June 2021 with constant left lateral upper pectoral infra chest pain inferior to her shoulder.  Her pain was constant and at times waxed and waned in intensity.  It was not exertionally precipitated.  She presented to the emergency room.  Her EKG was negative for ischemic change.  Troponins were negative.  Laboratory was notable for hypokalemia with potassium of 2.9.  Her ECG shows sinus rhythm with left axis deviation without ectopy or ST-T changes.  D-dimer was upper normal.  It was felt that her chest pain was noncardiac in etiology.  I had seen her for consultation in the hospital and recommended initial noninvasive assessment with coronary CTA I recommended discontinuance of hydrochlorothiazide.  An echo Doppler study on March 05, 2020 showed an EF of 65% without wall motion abnormalities.  There was grade 1 diastolic dysfunction.  There were normal pulmonary pressures.  There was no significant valvular abnormality.  A coronary CTA showed a calcium score at 75, representing 74th percentile for age and sex matched control.  There was mild coronary plaque in the LAD of 0 to 24%.  He was heavy calcification in the RCA creating a potential blooming artifact.  FFR analysis was normal.  Sara [name] subsequently saw a Sara [name] in the office for follow-up evaluation.  Presently, she does experience some  occasional left-sided chest wall tenderness.  She denies any exertional chest pain.  She developed Covid in June/July 2020.  Subsequently she did not get vaccinated.  At times she does note some mild swelling in her legs and has been wearing support stockings.  She sees Dr. [name] for primary care.  She presents for evaluation.    Past Medical History:  Diagnosis Date  . Allergic rhinitis due to pollen   . Anxiety   . Arthritis    "knees, back, right elbow; left shoulder; right ankle" (11/20/2014)  . Breast cancer, right breast ([place]) 2016   "DCIS; zero stage" S/P mastectomy, cancer free since then  . Cervical spondylosis without myelopathy   . Cervicalgia   . Chronic lower back pain   . Constipation    takes Miralax daily  . Degeneration of cervical intervertebral disc   . Depression   . Diverticulosis of colon (without mention of hemorrhage)   . GERD (gastroesophageal reflux disease)    takes Omeprazole daily  . Graves' disease    "took a pill to correct"  . H/O hiatal hernia   . H/O urinary frequency   . Headache(784.0)    denies migraines since the 80's but has occ and takes Butalbital prn  . HTN (hypertension)    takes Metoprolol and Lisinopril daily  . Insomnia    takes Ativan nigtly  . Joint pain    "all of them"  . Joint swelling    "knees, legs, ankles sometimes" (11/20/2014)  .  Morbid obesity (HCC)   . Numbness    LOWER LEGS  . Other postablative hypothyroidism    takes SYnthroid daily  . Palpitations   . Peptic ulcer, unspecified site, unspecified as acute or chronic, without mention of hemorrhage, perforation, or obstruction   . Primary localized osteoarthrosis, lower leg   . Pure hypercholesterolemia    takes Pravastin daily  . Recurrent UTI   . Renal cell carcinoma (HCC) 01/08/2016   Right renal mass s/p partial right nephrectomy, cancer free since 2017  . Shortness of breath   . Sleep apnea    slight but doesn't require a CPAP (11/20/2014)  .  Tendonitis of knee    bilateral    Past Surgical History:  Procedure Laterality Date  . ABDOMINAL HYSTERECTOMY    . ANTERIOR LAT LUMBAR FUSION  05/13/2012   Procedure: ANTERIOR LATERAL LUMBAR FUSION 2 LEVELS;  Surgeon: Reinaldo Meeker, MD;  Location: [facility];  Service: Neurosurgery;  Laterality: Left;  Left Lumbar Three-four,Lumbar four-five Extreme Lumbar Interbody Fusion with Percutaneous Pedicle Screws  . APPENDECTOMY    . BREAST BIOPSY Left ~ 2014  . BREAST BIOPSY Right 2015  . BREAST LUMPECTOMY Left 1988  . BREAST RECONSTRUCTION WITH PLACEMENT OF TISSUE EXPANDER AND FLEX HD (ACELLULAR HYDRATED DERMIS) Right 11/20/2014  . BREAST RECONSTRUCTION WITH PLACEMENT OF TISSUE EXPANDER AND FLEX HD (ACELLULAR HYDRATED DERMIS) Right 11/20/2014   Procedure: RIGHT BREAST RECONSTRUCTION WITH PLACEMENT OF TISSUE EXPANDER AND ACELLULAR DERMA MATRIX (ACELLULAR DERMA MATRIX);  Surgeon: Etter Sjogren, MD;  [facility];  Service: Plastics;  Laterality: Right;  . CATARACT EXTRACTION W/ INTRAOCULAR LENS  IMPLANT, BILATERAL Bilateral   . COLONOSCOPY  Sept 2008   RMR: normal rectum, left-sided diverticula, repeat in 2018  . COLONOSCOPY WITH PROPOFOL N/A 03/21/2018   Dr. Jena Gauss: diverticulosis, internal grade I hemorrhoids. no future colonoscopies unless develops new symptoms  . DIAGNOSTIC LAPAROSCOPY    . DILATION AND CURETTAGE OF UTERUS    . ESOPHAGOGASTRODUODENOSCOPY    . ESOPHAGOGASTRODUODENOSCOPY  2013   Dr. Jena Gauss: Cervical esophageal web and Schatzki's ring s/p dilation, small hiatal hernia, antral and duodenal erosions likely NSAID effect, chronic duodenitis on path   . ESOPHAGOGASTRODUODENOSCOPY (EGD) WITH PROPOFOL N/A 03/21/2018   Dr. Jena Gauss: mild Schatzki ring s/p dilation  . FOREIGN BODY REMOVAL Right 10/06/2013   Procedure: REMOVAL FOREIGN BODY EXTREMITY;  Surgeon: Vickki Hearing, MD;  Location: [facility];  Service: Orthopedics;  Laterality: Right;  . INJECTION KNEE Left 10/06/2013   Procedure:  KNEE INJECTION;  Surgeon: Vickki Hearing, MD;  Location: [facility];  Service: Orthopedics;  Laterality: Left;  . JOINT REPLACEMENT Bilateral    knees  . KNEE ARTHROSCOPY Right   . KNEE ARTHROSCOPY WITH LATERAL MENISECTOMY Right 10/06/2013   Procedure: KNEE ARTHROSCOPY WITH LATERAL AND MEDIAL MENISECTOMY;  Surgeon: Vickki Hearing, MD;  Location: [facility];  Service: Orthopedics;  Laterality: Right;  END @ 1234  . lumpectomy on pelvis     "mass of nerves"  . MALONEY DILATION N/A 03/21/2018   Procedure: Elease Hashimoto DILATION;  Surgeon: Corbin Ade, MD;  Location: [facility];  Service: Endoscopy;  Laterality: N/A;  . MASTECTOMY COMPLETE / SIMPLE W/ SENTINEL NODE BIOPSY Right 11/20/2014   axillary  . REMOVAL OF TISSUE EXPANDER AND PLACEMENT OF IMPLANT Right 12/13/2014   Procedure: REMOVAL OF RIGHT BREAST TISSUE EXPANDER;  Surgeon: Etter Sjogren, MD;  Location: [facility] OR;  Service: Plastics;  Laterality: Right;  . ROBOTIC ASSITED  PARTIAL NEPHRECTOMY Right 01/08/2016   Procedure: XI ROBOTIC ASSITED PARTIAL NEPHRECTOMY;  Surgeon: Alexis Frock, MD;  Location: [facility]S;  Service: Urology;  Laterality: Right;  . SIMPLE MASTECTOMY WITH AXILLARY SENTINEL NODE BIOPSY Right 11/20/2014   Procedure: SIMPLE MASTECTOMY WITH AXILLARY SENTINEL NODE BIOPSY;  Surgeon: Erroll Luna, MD;  Location: [place];  Service: General;  Laterality: Right;  . TISSUE EXPANDER REMOVAL Right 12/13/2014   dr Harlow Mares  . TONSILLECTOMY    . TOTAL KNEE ARTHROPLASTY Right 01/24/2014   Procedure: TOTAL KNEE ARTHROPLASTY;  Surgeon: Carole Civil, MD;  Location: [facility];  Service: Orthopedics;  Laterality: Right;  . TOTAL KNEE ARTHROPLASTY Left 08/08/2014   Procedure: TOTAL KNEE ARTHROPLASTY;  Surgeon: Carole Civil, MD;  Location: [facility];  Service: Orthopedics;  Laterality: Left;  . TUBAL LIGATION    . UPPER GASTROINTESTINAL ENDOSCOPY      Current Medications: Outpatient Medications Prior to Visit  Medication Sig Dispense Refill  .  acetaminophen (TYLENOL) 500 MG tablet Take 650 mg by mouth in the morning and at bedtime.     Marland Kitchen aspirin EC 81 MG tablet Take 81 mg by mouth daily. Swallow whole.    Marland Kitchen atorvastatin (LIPITOR) 20 MG tablet Take 1 tablet (20 mg total) by mouth daily. 30 tablet 11  . diphenhydrAMINE (BENADRYL) 50 MG capsule Take 50 mg by mouth at bedtime.    . ergocalciferol (VITAMIN D2) 1.25 MG (50000 UT) capsule Take 1 capsule (50,000 Units total) by mouth once a week. 16 capsule 3  . gabapentin (NEURONTIN) 600 MG tablet Take 1 tablet (600 mg total) by mouth 2 (two) times daily. 90 tablet 5  . levothyroxine (SYNTHROID, LEVOTHROID) 88 MCG tablet Take 88 mcg by mouth daily before breakfast.     . omeprazole (PRILOSEC) 20 MG capsule Take 20 mg by mouth daily. May take a second 20 mg dose as needed for heartburn    . oxybutynin (DITROPAN) 5 MG tablet Take 5 mg by mouth 2 (two) times daily.     . potassium chloride (KLOR-CON) 10 MEQ tablet Take 1 tablet (10 mEq total) by mouth daily. 90 tablet 3  . sertraline (ZOLOFT) 50 MG tablet TAKE 1 TABLET BY MOUTH  DAILY (Patient taking differently: Take 50 mg by mouth daily. ) 90 tablet 3  . carvedilol (COREG) 3.125 MG tablet Take 1 tablet (3.125 mg total) by mouth 2 (two) times daily with a meal. 60 tablet 2  . hydrochlorothiazide (HYDRODIURIL) 25 MG tablet Take 1 tablet (25 mg total) by mouth daily. 90 tablet 3   No facility-administered medications prior to visit.     Allergies:   Propoxyphene hcl, Pollen extract, Camphor, Dust mite extract, Latex, Molds & smuts, and Tomato   Social History   Socioeconomic History  . Marital status: Married    Spouse name: Milbert Coulter  . Number of children: 4  . Years of education: Not on file  . Highest education level: Not on file  Occupational History  . Occupation: disability    Employer: UNEMPLOYED    Comment: since 2008, knee surgery  Tobacco Use  . Smoking status: Former Smoker    Packs/day: 1.00    Years: 27.00    Pack years:  27.00    Types: Cigarettes    Quit date: 12/26/1985    Years since quitting: 34.4  . Smokeless tobacco: Never Used  . Tobacco comment: Quit in 1987  Substance and Sexual Activity  . Alcohol use: Yes  Alcohol/week: 0.0 standard drinks    Comment: occasional wine  . Drug use: No  . Sexual activity: Yes    Birth control/protection: Surgical  Other Topics Concern  . Not on file  Social History Narrative   Currently married for 10 years. Husband's name is Shon Hale. She is an excellent singer. 8 children between she and her husband. 23 grand children. 12 great grandchildren.   Social Determinants of Health   Financial Resource Strain:   . Difficulty of Paying Living Expenses: Not on file  Food Insecurity:   . Worried About Programme researcher, broadcasting/film/video in the Last Year: Not on file  . Ran Out of Food in the Last Year: Not on file  Transportation Needs:   . Lack of Transportation (Medical): Not on file  . Lack of Transportation (Non-Medical): Not on file  Physical Activity:   . Days of Exercise per Week: Not on file  . Minutes of Exercise per Session: Not on file  Stress:   . Feeling of Stress : Not on file  Social Connections:   . Frequency of Communication with Friends and Family: Not on file  . Frequency of Social Gatherings with Friends and Family: Not on file  . Attends Religious Services: Not on file  . Active Member of Clubs or Organizations: Not on file  . Attends Banker Meetings: Not on file  . Marital Status: Not on file     Family History:  The patient's family history includes Heart attack in her father, sister, sister, and sister; Lung cancer in her maternal grandmother; Obesity in her brother; Osteoarthritis in her brother and sister; Stroke in her sister.   ROS General: Negative; No fevers, chills, or night sweats;  HEENT: Negative; No changes in vision or hearing, sinus congestion, difficulty swallowing Pulmonary: Negative; No cough, wheezing, shortness of  breath, hemoptysis Cardiovascular: Negative; No chest pain, presyncope, syncope, palpitations GI: Negative; No nausea, vomiting, diarrhea, or abdominal pain GU: Negative; No dysuria, hematuria, or difficulty voiding Musculoskeletal: Negative; no myalgias, joint pain, or weakness Hematologic/Oncology: History of breast cancer, status post right mastectomy.  History of renal cancer. Endocrine: Negative; no heat/cold intolerance; no diabetes Neuro: Negative; no changes in balance, headaches Skin: Negative; No rashes or skin lesions Psychiatric: Negative; No behavioral problems, depression Sleep: Negative; No snoring, daytime sleepiness, hypersomnolence, bruxism, restless legs, hypnogognic hallucinations, no cataplexy Other comprehensive 14 point system review is negative.   PHYSICAL EXAM:   VS:  BP 122/68   Pulse 62   Temp (!) 97.2 F (36.2 C)   Ht 5\' 6"  (1.676 m)   Wt 206 lb 12.8 oz (93.8 kg)   SpO2 98%   BMI 33.38 kg/m     Repeat blood pressure by me was low at 96/70  Wt Readings from Last 3 Encounters:  06/14/20 206 lb 12.8 oz (93.8 kg)  04/04/20 205 lb 7.5 oz (93.2 kg)  03/27/20 209 lb (94.8 kg)    General: Alert, oriented, no distress.  Skin: normal turgor, no rashes, warm and dry HEENT: Normocephalic, atraumatic. Pupils equal round and reactive to light; sclera anicteric; extraocular muscles intact;  Nose without nasal septal hypertrophy Mouth/Parynx benign; Mallinpatti scale Neck: No JVD, no carotid bruits; normal carotid upstroke Lungs: clear to ausculatation and percussion; no wheezing or rales Chest wall: Tenderness over the left costochondral region; status post right mastectomy Heart: PMI not displaced, RRR, s1 s2 normal, 1/6 systolic murmur, no diastolic murmur, no rubs, gallops, thrills, or heaves Abdomen: soft,  nontender; no hepatosplenomehaly, BS+; abdominal aorta nontender and not dilated by palpation. Back: no CVA tenderness Pulses 2+ Musculoskeletal: full  range of motion, normal strength, no joint deformities Extremities: no clubbing cyanosis or edema, Homan's sign negative  Neurologic: grossly nonfocal; Cranial nerves grossly wnl Psychologic: Normal mood and affect   Studies/Labs Reviewed:   EKG:  EKG is ordered today.  ECG (independently read by me): Normal sinus rhythm at 62 bpm.  Possible left atrial enlargement.  Low voltage anteriorly.  Recent Labs: BMP Latest Ref Rng & Units 06/07/2020 03/28/2020 03/07/2020  Glucose 65 - 99 mg/dL 98 93 96  BUN 7 - 25 mg/dL 12 12 6(L)  Creatinine 0.60 - 0.93 mg/dL 0.72 0.67 0.79  BUN/Creat Ratio 6 - 22 (calc) NOT APPLICABLE - -  Sodium 604 - 146 mmol/L 137 140 139  Potassium 3.5 - 5.3 mmol/L 3.9 4.0 3.8  Chloride 98 - 110 mmol/L 102 105 105  CO2 20 - 32 mmol/L $RemoveB'27 23 27  'LSOlxBdB$ Calcium 8.6 - 10.4 mg/dL 8.8 8.7(L) 8.4(L)     Hepatic Function Latest Ref Rng & Units 03/28/2020 09/25/2019 02/28/2019  Total Protein 6.5 - 8.1 g/dL 7.1 7.0 7.2  Albumin 3.5 - 5.0 g/dL 3.4(L) 3.2(L) 3.6  AST 15 - 41 U/L 32 44(H) 38  ALT 0 - 44 U/L 30 35 29  Alk Phosphatase 38 - 126 U/L 53 44 49  Total Bilirubin 0.3 - 1.2 mg/dL 0.6 0.5 0.7  Bilirubin, Direct 0.0 - 0.3 mg/dL - - -    CBC Latest Ref Rng & Units 03/28/2020 03/06/2020 03/05/2020  WBC 4.0 - 10.5 K/uL 7.1 7.7 7.9  Hemoglobin 12.0 - 15.0 g/dL 13.0 12.1 12.5  Hematocrit 36 - 46 % 40.1 36.2 38.0  Platelets 150 - 400 K/uL 215 198 190   Lab Results  Component Value Date   MCV 98.0 03/28/2020   MCV 96.3 03/06/2020   MCV 97.7 03/05/2020   Lab Results  Component Value Date   TSH 1.504 03/04/2020   No results found for: HGBA1C   BNP No results found for: BNP  ProBNP No results found for: PROBNP   Lipid Panel     Component Value Date/Time   CHOL 141 06/07/2020 0833   TRIG 71 06/07/2020 0833   HDL 57 06/07/2020 0833   CHOLHDL 2.5 06/07/2020 0833   VLDL 14 03/06/2020 0420   LDLCALC 69 06/07/2020 0833     RADIOLOGY: No results found.   Additional  studies/ records that were reviewed today include:  I reviewed the records from the patient's recent hospitalization as well as follow-up office visit with Sara Ferries, PA    ASSESSMENT:    1. Atypical chest pain   2. Essential hypertension   3. Coronary artery calcification   4. Hyperlipidemia with target LDL less than 70   5. History of breast cancer   6. History of renal calculi     PLAN:  Sara [name] is a pleasant 74 year old female has a history of hypertension, hyperlipidemia, hypothyroidism, GERD, reported mild OSA not on therapy and who has experienced episodes of atypical chest pain, mostly of musculoskeletal etiology.  I reviewed her coronary CTA which showed mild disease and I suspect it was blooming artifact in the RCA.  FFR analysis was normal.  She does not have any exertional chest pain symptomatology.  On exam today she does have costochondral tenderness to palpation which is mimicked her chest pain.  Blood pressure when checked by me was low.  I have recommended reduction of her hydrochlorothiazide dose to 12.5 mg rather than 25 mg.  She continues to be on carvedilol 3.125 mg twice a day.  She is on atorvastatin for her coronary atherosclerosis.  Most recent LDL cholesterol was 69.  She is on gabapentin for neuropathy.  She has GERD treated with omeprazole.  She is hypothyroid is treated with levothyroxine 88 mcg.  I feel she is stable from a cardiovascular standpoint.  BMI is 33.38 and weight loss was recommended.  She did have a Covid infection in June/July 2020.  I did suggest benefit of vaccination despite her having had Covid.  She will return to the primary care of Dr. Walker Kehr.  I will see her in 1 year for reevaluation or sooner as needed.   Medication Adjustments/Labs and Tests Ordered: Current medicines are reviewed at length with the patient today.  Concerns regarding medicines are outlined above.  Medication changes, Labs and Tests ordered today are listed in the  Patient Instructions below. Patient Instructions  Medication Instructions:  DECREASE HCTZ TO 12.5 MG ONCE DAILY= 1/2 OF THE 25 MG TABLET ONCE DAILY  *If you need a refill on your cardiac medications before your next appointment, please call your pharmacy*   Follow-Up: At [facility], you and your health needs are our priority.  As part of our continuing mission to provide you with exceptional heart care, we have created designated Provider Care Teams.  These Care Teams include your primary Cardiologist (physician) and Advanced Practice Providers (APPs -  Physician Assistants and Nurse Practitioners) who all work together to provide you with the care you need, when you need it.  We recommend signing up for the patient portal called "MyChart".  Sign up information is provided on this After Visit Summary.  MyChart is used to connect with patients for Virtual Visits (Telemedicine).  Patients are able to view lab/test results, encounter notes, upcoming appointments, etc.  Non-urgent messages can be sent to your provider as well.   To learn more about what you can do with MyChart, go to [email or web address].    Your next appointment:   12 month(s)  The format for your next appointment:   In Person  Provider:   You may see Shelva Majestic, MD or one of the following Advanced Practice Providers on your designated Care Team:    Almyra Deforest, PA-C  Fabian Sharp, PA-C or   Roby Lofts, [place]        Signed, Shelva Majestic, MD  06/14/2020 9:03 PM    [facility] [address], [place], [place], [place]  [address] Phone: [phone]

## 2020-07-15 DIAGNOSIS — H8110 Benign paroxysmal vertigo, unspecified ear: Secondary | ICD-10-CM | POA: Diagnosis not present

## 2020-07-15 DIAGNOSIS — R42 Dizziness and giddiness: Secondary | ICD-10-CM | POA: Diagnosis not present

## 2020-07-20 DIAGNOSIS — E039 Hypothyroidism, unspecified: Secondary | ICD-10-CM | POA: Diagnosis not present

## 2020-07-20 DIAGNOSIS — M159 Polyosteoarthritis, unspecified: Secondary | ICD-10-CM | POA: Diagnosis not present

## 2020-07-20 DIAGNOSIS — I10 Essential (primary) hypertension: Secondary | ICD-10-CM | POA: Diagnosis not present

## 2020-07-20 DIAGNOSIS — E785 Hyperlipidemia, unspecified: Secondary | ICD-10-CM | POA: Diagnosis not present

## 2020-08-08 ENCOUNTER — Other Ambulatory Visit (HOSPITAL_COMMUNITY): Payer: Self-pay

## 2020-08-08 DIAGNOSIS — F32A Depression, unspecified: Secondary | ICD-10-CM

## 2020-08-08 DIAGNOSIS — F419 Anxiety disorder, unspecified: Secondary | ICD-10-CM

## 2020-08-08 MED ORDER — SERTRALINE HCL 50 MG PO TABS: 50.0000 mg | ORAL_TABLET | Freq: Every day | ORAL | 3 refills | Status: DC

## 2020-08-14 ENCOUNTER — Other Ambulatory Visit (HOSPITAL_COMMUNITY): Payer: Self-pay | Admitting: *Deleted

## 2020-08-26 ENCOUNTER — Other Ambulatory Visit (HOSPITAL_COMMUNITY): Payer: Self-pay

## 2020-08-26 DIAGNOSIS — F419 Anxiety disorder, unspecified: Secondary | ICD-10-CM

## 2020-08-26 DIAGNOSIS — F32A Depression, unspecified: Secondary | ICD-10-CM

## 2020-08-26 MED ORDER — SERTRALINE HCL 50 MG PO TABS: 50.0000 mg | ORAL_TABLET | Freq: Every day | ORAL | 3 refills | Status: DC

## 2020-08-30 DIAGNOSIS — H16223 Keratoconjunctivitis sicca, not specified as Sjogren's, bilateral: Secondary | ICD-10-CM | POA: Diagnosis not present

## 2020-10-07 ENCOUNTER — Ambulatory Visit (HOSPITAL_COMMUNITY): Payer: Medicare Other

## 2020-10-07 ENCOUNTER — Other Ambulatory Visit (HOSPITAL_COMMUNITY): Payer: Medicare Other

## 2020-10-10 ENCOUNTER — Ambulatory Visit (HOSPITAL_COMMUNITY): Payer: Medicare Other | Admitting: Nurse Practitioner

## 2020-10-11 ENCOUNTER — Other Ambulatory Visit (HOSPITAL_COMMUNITY): Payer: Self-pay

## 2020-10-11 DIAGNOSIS — E559 Vitamin D deficiency, unspecified: Secondary | ICD-10-CM

## 2020-10-11 DIAGNOSIS — D0511 Intraductal carcinoma in situ of right breast: Secondary | ICD-10-CM

## 2020-10-11 DIAGNOSIS — C649 Malignant neoplasm of unspecified kidney, except renal pelvis: Secondary | ICD-10-CM

## 2020-10-14 ENCOUNTER — Inpatient Hospital Stay (HOSPITAL_COMMUNITY): Payer: Medicare Other

## 2020-10-14 ENCOUNTER — Ambulatory Visit (HOSPITAL_COMMUNITY)
Admission: RE | Admit: 2020-10-14 | Discharge: 2020-10-14 | Disposition: A | Discharge status: Home / Self Care | Payer: Medicare Other | Source: Ambulatory Visit | Attending: Nurse Practitioner | Admitting: Nurse Practitioner

## 2020-10-14 ENCOUNTER — Other Ambulatory Visit: Payer: Self-pay

## 2020-10-14 DIAGNOSIS — E860 Dehydration: Secondary | ICD-10-CM | POA: Diagnosis not present

## 2020-10-14 DIAGNOSIS — Z8249 Family history of ischemic heart disease and other diseases of the circulatory system: Secondary | ICD-10-CM | POA: Diagnosis not present

## 2020-10-14 DIAGNOSIS — Z801 Family history of malignant neoplasm of trachea, bronchus and lung: Secondary | ICD-10-CM | POA: Insufficient documentation

## 2020-10-14 DIAGNOSIS — N3281 Overactive bladder: Secondary | ICD-10-CM | POA: Diagnosis not present

## 2020-10-14 DIAGNOSIS — Z8261 Family history of arthritis: Secondary | ICD-10-CM | POA: Diagnosis not present

## 2020-10-14 DIAGNOSIS — Z85528 Personal history of other malignant neoplasm of kidney: Secondary | ICD-10-CM | POA: Diagnosis not present

## 2020-10-14 DIAGNOSIS — Z87891 Personal history of nicotine dependence: Secondary | ICD-10-CM | POA: Diagnosis not present

## 2020-10-14 DIAGNOSIS — Z823 Family history of stroke: Secondary | ICD-10-CM | POA: Diagnosis not present

## 2020-10-14 DIAGNOSIS — E559 Vitamin D deficiency, unspecified: Secondary | ICD-10-CM | POA: Diagnosis not present

## 2020-10-14 DIAGNOSIS — C649 Malignant neoplasm of unspecified kidney, except renal pelvis: Secondary | ICD-10-CM

## 2020-10-14 DIAGNOSIS — Z79899 Other long term (current) drug therapy: Secondary | ICD-10-CM | POA: Insufficient documentation

## 2020-10-14 DIAGNOSIS — D0511 Intraductal carcinoma in situ of right breast: Secondary | ICD-10-CM

## 2020-10-14 DIAGNOSIS — Z905 Acquired absence of kidney: Secondary | ICD-10-CM | POA: Insufficient documentation

## 2020-10-14 DIAGNOSIS — R519 Headache, unspecified: Secondary | ICD-10-CM | POA: Diagnosis not present

## 2020-10-14 DIAGNOSIS — Z1231 Encounter for screening mammogram for malignant neoplasm of breast: Secondary | ICD-10-CM | POA: Insufficient documentation

## 2020-10-14 DIAGNOSIS — R5383 Other fatigue: Secondary | ICD-10-CM | POA: Diagnosis not present

## 2020-10-14 DIAGNOSIS — Z8349 Family history of other endocrine, nutritional and metabolic diseases: Secondary | ICD-10-CM | POA: Diagnosis not present

## 2020-10-14 DIAGNOSIS — I10 Essential (primary) hypertension: Secondary | ICD-10-CM | POA: Diagnosis not present

## 2020-10-14 DIAGNOSIS — Z9011 Acquired absence of right breast and nipple: Secondary | ICD-10-CM | POA: Insufficient documentation

## 2020-10-14 DIAGNOSIS — Z853 Personal history of malignant neoplasm of breast: Secondary | ICD-10-CM | POA: Insufficient documentation

## 2020-10-14 DIAGNOSIS — E039 Hypothyroidism, unspecified: Secondary | ICD-10-CM | POA: Diagnosis not present

## 2020-10-14 LAB — CBC WITH DIFFERENTIAL/PLATELET
Abs Immature Granulocytes: 0.03 10*3/uL (ref 0.00–0.07)
Basophils Absolute: 0.1 10*3/uL (ref 0.0–0.1)
Basophils Relative: 1 %
Eosinophils Absolute: 0.1 10*3/uL (ref 0.0–0.5)
Eosinophils Relative: 2 %
HCT: 41.8 % (ref 36.0–46.0)
Hemoglobin: 13.8 g/dL (ref 12.0–15.0)
Immature Granulocytes: 0 %
Lymphocytes Relative: 32 %
Lymphs Abs: 2.3 10*3/uL (ref 0.7–4.0)
MCH: 32.2 pg (ref 26.0–34.0)
MCHC: 33 g/dL (ref 30.0–36.0)
MCV: 97.7 fL (ref 80.0–100.0)
Monocytes Absolute: 0.5 10*3/uL (ref 0.1–1.0)
Monocytes Relative: 7 %
Neutro Abs: 4.1 10*3/uL (ref 1.7–7.7)
Neutrophils Relative %: 58 %
Platelets: 282 10*3/uL (ref 150–400)
RBC: 4.28 MIL/uL (ref 3.87–5.11)
RDW: 13.6 % (ref 11.5–15.5)
WBC: 7.1 10*3/uL (ref 4.0–10.5)
nRBC: 0 % (ref 0.0–0.2)

## 2020-10-14 LAB — COMPREHENSIVE METABOLIC PANEL
ALT: 29 U/L (ref 0–44)
AST: 37 U/L (ref 15–41)
Albumin: 3.1 g/dL — ABNORMAL LOW (ref 3.5–5.0)
Alkaline Phosphatase: 73 U/L (ref 38–126)
Anion gap: 7 (ref 5–15)
BUN: 12 mg/dL (ref 8–23)
CO2: 27 mmol/L (ref 22–32)
Calcium: 8.7 mg/dL — ABNORMAL LOW (ref 8.9–10.3)
Chloride: 100 mmol/L (ref 98–111)
Creatinine, Ser: 0.72 mg/dL (ref 0.44–1.00)
GFR, Estimated: >60 mL/min (ref >60)
Glucose, Bld: 99 mg/dL (ref 70–99)
Potassium: 3.9 mmol/L (ref 3.5–5.1)
Sodium: 134 mmol/L — ABNORMAL LOW (ref 135–145)
Total Bilirubin: 0.7 mg/dL (ref 0.3–1.2)
Total Protein: 6.9 g/dL (ref 6.5–8.1)

## 2020-10-14 LAB — VITAMIN D 25 HYDROXY (VIT D DEFICIENCY, FRACTURES): Vit D, 25-Hydroxy: 74.35 ng/mL (ref 30–100)

## 2020-10-14 LAB — LACTATE DEHYDROGENASE: LDH: 149 U/L (ref 98–192)

## 2020-10-14 LAB — FOLATE: Folate: 46 ng/mL (ref >5.9)

## 2020-10-14 LAB — VITAMIN B12: Vitamin B-12: 974 pg/mL — ABNORMAL HIGH (ref 180–914)

## 2020-10-15 ENCOUNTER — Inpatient Hospital Stay (HOSPITAL_COMMUNITY): Payer: Medicare Other | Attending: Hematology | Admitting: Oncology

## 2020-10-15 VITALS — BP 117/68 | HR 68 | Temp 97.1°F | Resp 18 | Wt 208.1 lb

## 2020-10-15 DIAGNOSIS — Z823 Family history of stroke: Secondary | ICD-10-CM | POA: Diagnosis not present

## 2020-10-15 DIAGNOSIS — I10 Essential (primary) hypertension: Secondary | ICD-10-CM | POA: Diagnosis not present

## 2020-10-15 DIAGNOSIS — Z9011 Acquired absence of right breast and nipple: Secondary | ICD-10-CM | POA: Diagnosis not present

## 2020-10-15 DIAGNOSIS — Z87891 Personal history of nicotine dependence: Secondary | ICD-10-CM | POA: Diagnosis not present

## 2020-10-15 DIAGNOSIS — Z905 Acquired absence of kidney: Secondary | ICD-10-CM | POA: Diagnosis not present

## 2020-10-15 DIAGNOSIS — Z8249 Family history of ischemic heart disease and other diseases of the circulatory system: Secondary | ICD-10-CM | POA: Diagnosis not present

## 2020-10-15 DIAGNOSIS — Z8261 Family history of arthritis: Secondary | ICD-10-CM | POA: Diagnosis not present

## 2020-10-15 DIAGNOSIS — D0511 Intraductal carcinoma in situ of right breast: Secondary | ICD-10-CM | POA: Diagnosis not present

## 2020-10-15 DIAGNOSIS — Z801 Family history of malignant neoplasm of trachea, bronchus and lung: Secondary | ICD-10-CM | POA: Diagnosis not present

## 2020-10-15 DIAGNOSIS — R5383 Other fatigue: Secondary | ICD-10-CM | POA: Diagnosis not present

## 2020-10-15 DIAGNOSIS — E860 Dehydration: Secondary | ICD-10-CM | POA: Diagnosis not present

## 2020-10-15 DIAGNOSIS — Z85528 Personal history of other malignant neoplasm of kidney: Secondary | ICD-10-CM | POA: Diagnosis not present

## 2020-10-15 DIAGNOSIS — Z8349 Family history of other endocrine, nutritional and metabolic diseases: Secondary | ICD-10-CM | POA: Diagnosis not present

## 2020-10-15 DIAGNOSIS — Z79899 Other long term (current) drug therapy: Secondary | ICD-10-CM | POA: Diagnosis not present

## 2020-10-15 DIAGNOSIS — E559 Vitamin D deficiency, unspecified: Secondary | ICD-10-CM | POA: Diagnosis not present

## 2020-10-15 DIAGNOSIS — Z853 Personal history of malignant neoplasm of breast: Secondary | ICD-10-CM | POA: Diagnosis not present

## 2020-10-15 NOTE — Progress Notes (Signed)
Sara [name], [name] [id number]   CLINIC:  Medical Oncology/Hematology  PCP:  [name], [name] [place] [address] [phone]   REASON FOR VISIT: Follow-up for breast cancer   CURRENT THERAPY: Observation  BRIEF ONCOLOGIC HISTORY:  Oncology History  Malignant neoplasm of upper outer quadrant of female breast ([name])  09/05/2014 Imaging   assymetry noted in R breast on screening mammogram   09/18/2014 Imaging   Irregular mass in the 12 o'clock location of the right breast. Tissue diagnosis is recommended.   09/18/2014 Imaging   IMPRESSION: Irregular mass in the 12 o'clock location of the right breast. Tissue diagnosis is recommended.   10/09/2014 Imaging   8 x 12 x 8 CM abnormal linear clumped enhancement throughout the majority of the central and upper right breast.   10/17/2014 Initial Biopsy   Breast, right, needle core biopsy, central - DUCTAL CARCINOMA IN SITU, MIXED PATTERNS (CRIBRIFORM, COMEDO AND PAPILLARY), INVOLVING MULTIPLE CORES, AT LEAST 9 MM IN MAXIMAL EXTENT IN ANY ONE CORE. - FOCAL LOBULAR NEOPLASIA (ALH/LCIS). - NO INVASIVE CAR   11/20/2014 Definitive Surgery   Breast, simple mastectomy, right - DUCTAL CARCINOMA IN SITU, SEE COMMENT. - LOBULAR CARCINOMA IN SITU (CONVENTIONAL AND PLEOMORPHIC TYPES). - PREVIOUS BIOPSY SITES (X2). - IN SITU CARCINOMA IS 3CM FROM NEAREST MARGIN (DEEP).   01/01/2015 -  Anti-estrogen oral therapy   Tamoxifen daily.   07/21/2016 Breast US   BIRADS 4   07/29/2016 Procedure   Ultrasound guided biopsy of right axillary mass/lymph node. No apparent complications.   07/30/2016 Pathology Results   Pathology of the right axillary lymph node biopsy revealed LYMPHOID TISSUE WITH DERMATOPATHIC CHANGE. NO METASTATIC CARCINOMA IDENTIFIED.    Renal cell carcinoma ([place])  01/08/2016 Procedure   Robotic-assisted laparoscopic right partial nephrectomy by Dr. Tresa Moore.   01/08/2016 Pathology  Results   Kidney, wedge excision / partial resection, right renal mass RENAL CELL CARCINOMA, CLEAR CELL TYPE, WHO NUCLEAR GRADE 2 (2.4 CM) THE TUMOR IS CONFINED TO THE KIDNEY MARGINS OF RESECTION ARE NEGATIVE FOR TUMOR     CANCER STAGING: Cancer Staging Malignant neoplasm of upper outer quadrant of female breast ([place]) Staging form: Breast, AJCC 7th Edition - Clinical: Stage 0 (Tis (DCIS), N0, M0) - Signed by [name], MD on 10/11/2014  Renal cell carcinoma [facility]) Staging form: Kidney, AJCC 7th Edition - Clinical stage from 06/02/2016: Stage Unknown (T1a, NX, M0) - Signed by [name] Cancer, PA-C on 06/02/2016    INTERVAL HISTORY:  [name] 75 y.o. female returns for routine follow-up for breast cancer.  She was last seen in clinic on April 04, 2020.  Today, she reports feeling well.  She continues to have low energy levels and fatigue.  She had a mammogram completed yesterday that was BI-RADS Category 1 or negative.  She reports that she is doing well.  She has some dizziness when she stands.  She is no longer on anastrozole.  She has fatigue and no energy.  Her husband states she does not sleep well at night.  She currently is not taking any medications for sleep.  Denies any nausea, vomiting, or diarrhea. Denies any new pains. Had not noticed any recent bleeding such as epistaxis, hematuria or hematochezia. Denies recent chest pain on exertion, shortness of breath on minimal exertion, pre-syncopal episodes, or palpitations. Denies any numbness or tingling in hands or feet. Denies any recent fevers, infections, or recent hospitalizations. Patient  reports appetite at 75% and energy level at 25%.  She is eating well maintain her weight at this time.   REVIEW OF SYSTEMS:  Review of Systems  Constitutional: Positive for fatigue. Negative for appetite change, fever and unexpected weight change.  HENT:   Negative for nosebleeds, sore throat and trouble swallowing.   Eyes: Negative.    Respiratory: Negative.  Negative for cough, shortness of breath and wheezing.   Cardiovascular: Negative.  Negative for chest pain and leg swelling.  Gastrointestinal: Negative for abdominal pain, blood in stool, constipation, diarrhea, nausea and vomiting.  Endocrine: Negative.   Genitourinary: Negative.  Negative for bladder incontinence, hematuria and nocturia.   Musculoskeletal: Negative.  Negative for back pain and flank pain.  Skin: Negative.   Neurological: Positive for dizziness. Negative for headaches, light-headedness and numbness.  Hematological: Negative.   Psychiatric/Behavioral: Positive for sleep disturbance. Negative for confusion. The patient is not nervous/anxious.      PAST MEDICAL/SURGICAL HISTORY:  Past Medical History:  Diagnosis Date  . Allergic rhinitis due to pollen   . Anxiety   . Arthritis    "knees, back, right elbow; left shoulder; right ankle" (11/20/2014)  . Breast cancer, right breast ([name]) 2016   "DCIS; zero stage" S/P mastectomy, cancer free since then  . Cervical spondylosis without myelopathy   . Cervicalgia   . Chronic lower back pain   . Constipation    takes Miralax daily  . Degeneration of cervical intervertebral disc   . Depression   . Diverticulosis of colon (without mention of hemorrhage)   . GERD (gastroesophageal reflux disease)    takes Omeprazole daily  . Graves' disease    "took a pill to correct"  . H/O hiatal hernia   . H/O urinary frequency   . Headache(784.0)    denies migraines since the 80's but has occ and takes Butalbital prn  . HTN (hypertension)    takes Metoprolol and Lisinopril daily  . Insomnia    takes Ativan nigtly  . Joint pain    "all of them"  . Joint swelling    "knees, legs, ankles sometimes" (11/20/2014)  . Morbid obesity ([place])   . Numbness    LOWER LEGS  . Other postablative hypothyroidism    takes SYnthroid daily  . Palpitations   . Peptic ulcer, unspecified site, unspecified as acute or chronic,  without mention of hemorrhage, perforation, or obstruction   . Primary localized osteoarthrosis, lower leg   . Pure hypercholesterolemia    takes Pravastin daily  . Recurrent UTI   . Renal cell carcinoma ([place]) 01/08/2016   Right renal mass s/p partial right nephrectomy, cancer free since 2017  . Shortness of breath   . Sleep apnea    slight but doesn't require a CPAP (11/20/2014)  . Tendonitis of knee    bilateral   Past Surgical History:  Procedure Laterality Date  . ABDOMINAL HYSTERECTOMY    . ANTERIOR LAT LUMBAR FUSION  05/13/2012   Procedure: ANTERIOR LATERAL LUMBAR FUSION 2 LEVELS;  Surgeon: Faythe Ghee, MD;  Location: [facility];  Service: Neurosurgery;  Laterality: Left;  Left Lumbar Three-four,Lumbar four-five Extreme Lumbar Interbody Fusion with Percutaneous Pedicle Screws  . APPENDECTOMY    . BREAST BIOPSY Left ~ 2014  . BREAST BIOPSY Right 2015  . BREAST LUMPECTOMY Left 1988  . BREAST RECONSTRUCTION WITH PLACEMENT OF TISSUE EXPANDER AND FLEX HD (ACELLULAR HYDRATED DERMIS) Right 11/20/2014  . BREAST RECONSTRUCTION WITH PLACEMENT OF TISSUE EXPANDER AND  FLEX HD (ACELLULAR HYDRATED DERMIS) Right 11/20/2014   Procedure: RIGHT BREAST RECONSTRUCTION WITH PLACEMENT OF TISSUE EXPANDER AND ACELLULAR DERMA MATRIX (ACELLULAR DERMA MATRIX);  Surgeon: Crissie Reese, MD;  Location: [facility];  Service: Plastics;  Laterality: Right;  . CATARACT EXTRACTION W/ INTRAOCULAR LENS  IMPLANT, BILATERAL Bilateral   . COLONOSCOPY  Sept 2008   RMR: normal rectum, left-sided diverticula, repeat in 2018  . COLONOSCOPY WITH PROPOFOL N/A 03/21/2018   Dr. Gala Romney: diverticulosis, internal grade I hemorrhoids. no future colonoscopies unless develops new symptoms  . DIAGNOSTIC LAPAROSCOPY    . DILATION AND CURETTAGE OF UTERUS    . ESOPHAGOGASTRODUODENOSCOPY    . ESOPHAGOGASTRODUODENOSCOPY  2013   Dr. Gala Romney: Cervical esophageal web and Schatzki's ring s/p dilation, small hiatal hernia, antral and duodenal erosions  likely NSAID effect, chronic duodenitis on path   . ESOPHAGOGASTRODUODENOSCOPY (EGD) WITH PROPOFOL N/A 03/21/2018   Dr. Gala Romney: mild Schatzki ring s/p dilation  . FOREIGN BODY REMOVAL Right 10/06/2013   Procedure: REMOVAL FOREIGN BODY EXTREMITY;  Surgeon: Carole Civil, MD;  Location: [facility];  Service: Orthopedics;  Laterality: Right;  . INJECTION KNEE Left 10/06/2013   Procedure: KNEE INJECTION;  Surgeon: Carole Civil, MD;  Location: [facility];  Service: Orthopedics;  Laterality: Left;  . JOINT REPLACEMENT Bilateral    knees  . KNEE ARTHROSCOPY Right   . KNEE ARTHROSCOPY WITH LATERAL MENISECTOMY Right 10/06/2013   Procedure: KNEE ARTHROSCOPY WITH LATERAL AND MEDIAL MENISECTOMY;  Surgeon: Carole Civil, MD;  Location: [facility];  Service: Orthopedics;  Laterality: Right;  END @ 1234  . lumpectomy on pelvis     "mass of nerves"  . MALONEY DILATION N/A 03/21/2018   Procedure: Venia Minks DILATION;  Surgeon: Daneil Dolin, MD;  [facility];  Service: Endoscopy;  Laterality: N/A;  . MASTECTOMY COMPLETE / SIMPLE W/ SENTINEL NODE BIOPSY Right 11/20/2014   axillary  . REMOVAL OF TISSUE EXPANDER AND PLACEMENT OF IMPLANT Right 12/13/2014   Procedure: REMOVAL OF RIGHT BREAST TISSUE EXPANDER;  Surgeon: Crissie Reese, MD;  Location: [facility];  Service: Plastics;  Laterality: Right;  . ROBOTIC ASSITED PARTIAL NEPHRECTOMY Right 01/08/2016   Procedure: XI ROBOTIC ASSITED PARTIAL NEPHRECTOMY;  Surgeon: Alexis Frock, MD;  Location: [facility];  Service: Urology;  Laterality: Right;  . SIMPLE MASTECTOMY WITH AXILLARY SENTINEL NODE BIOPSY Right 11/20/2014   Procedure: SIMPLE MASTECTOMY WITH AXILLARY SENTINEL NODE BIOPSY;  Surgeon: Erroll Luna, MD;  Location: [facility];  Service: General;  Laterality: Right;  . TISSUE EXPANDER REMOVAL Right 12/13/2014   dr Harlow Mares  . TONSILLECTOMY    . TOTAL KNEE ARTHROPLASTY Right 01/24/2014   Procedure: TOTAL KNEE ARTHROPLASTY;  Surgeon: Carole Civil, MD;  [facility];  Service: Orthopedics;  Laterality: Right;  . TOTAL KNEE ARTHROPLASTY Left 08/08/2014   Procedure: TOTAL KNEE ARTHROPLASTY;  Surgeon: Carole Civil, MD;  Location[facility];  Service: Orthopedics;  Laterality: Left;  . TUBAL LIGATION    . UPPER GASTROINTESTINAL ENDOSCOPY       SOCIAL HISTORY:  Social History   Socioeconomic History  . Marital status: Married    Spouse name: Milbert Coulter  . Number of children: 4  . Years of education: Not on file  . Highest education level: Not on file  Occupational History  . Occupation: disability    Employer: UNEMPLOYED    Comment: since 2008, knee surgery  Tobacco Use  . Smoking status: Former Smoker    Packs/day: 1.00  Years: 27.00    Pack years: 27.00    Types: Cigarettes    Quit date: 12/26/1985    Years since quitting: 34.8  . Smokeless tobacco: Never Used  . Tobacco comment: Quit in 1987  Substance and Sexual Activity  . Alcohol use: Yes    Alcohol/week: 0.0 standard drinks    Comment: occasional wine  . Drug use: No  . Sexual activity: Yes    Birth control/protection: Surgical  Other Topics Concern  . Not on file  Social History Narrative   Currently married for 10 years. Husband's name is Milbert Coulter. She is an excellent singer. 8 children between she and her husband. 23 grand children. 12 great grandchildren.   Social Determinants of Health   Financial Resource Strain: Not on file  Food Insecurity: Not on file  Transportation Needs: Not on file  Physical Activity: Not on file  Stress: Not on file  Social Connections: Not on file  Intimate Partner Violence: Not on file    FAMILY HISTORY:  Family History  Problem Relation Age of Onset  . Stroke Sister   . Heart attack Sister        In her 18s  . Osteoarthritis Sister   . Heart attack Sister        In her 76s  . Osteoarthritis Brother   . Obesity Brother   . Heart attack Sister        In her 43s  . Heart attack Father        He died at age 48  . Lung cancer  Maternal Grandmother        non-smoker  . Colon cancer Neg Hx        Mother died at 56 from blood clot, MI    CURRENT MEDICATIONS:  Outpatient Encounter Medications as of 10/15/2020  Medication Sig  . aspirin EC 81 MG tablet Take 81 mg by mouth daily. Swallow whole.  Marland Kitchen atorvastatin (LIPITOR) 20 MG tablet Take 1 tablet (20 mg total) by mouth daily.  . carvedilol (COREG) 3.125 MG tablet Take 1 tablet (3.125 mg total) by mouth 2 (two) times daily with a meal.  . diphenhydrAMINE (BENADRYL) 50 MG capsule Take 50 mg by mouth at bedtime.  . ergocalciferol (VITAMIN D2) 1.25 MG (50000 UT) capsule Take 1 capsule (50,000 Units total) by mouth once a week.  . fluorometholone (FML) 0.1 % ophthalmic suspension SMARTSIG:In Eye(s)  . gabapentin (NEURONTIN) 600 MG tablet Take 1 tablet (600 mg total) by mouth 2 (two) times daily.  Marland Kitchen levothyroxine (SYNTHROID, LEVOTHROID) 88 MCG tablet Take 88 mcg by mouth daily before breakfast.   . meclizine (ANTIVERT) 25 MG tablet   . omeprazole (PRILOSEC) 20 MG capsule Take 20 mg by mouth daily. May take a second 20 mg dose as needed for heartburn  . oxybutynin (DITROPAN) 5 MG tablet Take 5 mg by mouth 2 (two) times daily.   . sertraline (ZOLOFT) 50 MG tablet Take 1 tablet (50 mg total) by mouth daily.  Marland Kitchen acetaminophen (TYLENOL) 500 MG tablet Take 650 mg by mouth in the morning and at bedtime.  (Patient not taking: Reported on 10/15/2020)  . hydrochlorothiazide (HYDRODIURIL) 25 MG tablet Take 0.5 tablets (12.5 mg total) by mouth daily.  . potassium chloride (KLOR-CON) 10 MEQ tablet Take 1 tablet (10 mEq total) by mouth daily.  . [DISCONTINUED] Ranitidine HCl (ZANTAC 75 PO) Take by mouth.     No facility-administered encounter medications on file as of 10/15/2020.  ALLERGIES:  Allergies  Allergen Reactions  . Propoxyphene Hcl Other (See Comments)     Hallucinations  . Pollen Extract Other (See Comments)    Sneezing, congestion  . Camphor Itching  . Dust Mite  Extract Itching    Sneezing. Runny nose   . Latex Dermatitis and Rash  . Molds & Smuts Itching  . Tomato Hives     PHYSICAL EXAM:  ECOG Performance status: 1  Vitals:   10/15/20 1245  BP: 117/68  Pulse: 68  Resp: 18  Temp: (!) 97.1 F (36.2 C)  SpO2: 99%   Filed Weights   10/15/20 1245  Weight: 208 lb 1.6 oz (94.4 kg)   Physical Exam Constitutional:      Appearance: Normal appearance. She is normal weight.  Cardiovascular:     Rate and Rhythm: Normal rate and regular rhythm.     Heart sounds: Normal heart sounds.  Pulmonary:     Effort: Pulmonary effort is normal.     Breath sounds: Normal breath sounds.  Abdominal:     General: Bowel sounds are normal.     Palpations: Abdomen is soft.  Musculoskeletal:        General: Normal range of motion.  Skin:    General: Skin is warm.  Neurological:     Mental Status: She is alert and oriented to person, place, and time. Mental status is at baseline.  Psychiatric:        Mood and Affect: Mood normal.        Behavior: Behavior normal.        Thought Content: Thought content normal.        Judgment: Judgment normal.      LABORATORY DATA:  I have reviewed the labs as listed.  CBC    Component Value Date/Time   WBC 7.1 10/14/2020 0904   RBC 4.28 10/14/2020 0904   HGB 13.8 10/14/2020 0904   HCT 41.8 10/14/2020 0904   PLT 282 10/14/2020 0904   MCV 97.7 10/14/2020 0904   MCH 32.2 10/14/2020 0904   MCHC 33.0 10/14/2020 0904   RDW 13.6 10/14/2020 0904   LYMPHSABS 2.3 10/14/2020 0904   MONOABS 0.5 10/14/2020 0904   EOSABS 0.1 10/14/2020 0904   BASOSABS 0.1 10/14/2020 0904   CMP Latest Ref Rng & Units 10/14/2020 06/07/2020 03/28/2020  Glucose 70 - 99 mg/dL 99 98 93  BUN 8 - 23 mg/dL 12 12 12   Creatinine 0.44 - 1.00 mg/dL 0.72 0.72 0.67  Sodium 135 - 145 mmol/L 134(L) 137 140  Potassium 3.5 - 5.1 mmol/L 3.9 3.9 4.0  Chloride 98 - 111 mmol/L 100 102 105  CO2 22 - 32 mmol/L 27 27 23   Calcium 8.9 - 10.3 mg/dL 8.7(L)  8.8 8.7(L)  Total Protein 6.5 - 8.1 g/dL 6.9 - 7.1  Total Bilirubin 0.3 - 1.2 mg/dL 0.7 - 0.6  Alkaline Phos 38 - 126 U/L 73 - 53  AST 15 - 41 U/L 37 - 32  ALT 0 - 44 U/L 29 - 30    All questions were answered to patient's stated satisfaction. Encouraged patient to call with any new concerns or questions before his next visit to the cancer center and we can certain see him sooner, if needed.     ASSESSMENT & PLAN:  1.  Right breast DCIS/LCIS: -Status post right simple mastectomy on 11/20/2014, lymph node biopsy negative.  Grade 2, 10 cm, ER/PR positive. -Tamoxifen started on 01/01/2015.  She completed 5 years.  She  stopped the tamoxifen on 01/19/2020. -Mammogram from 10/14/2020 was BI-RADS Category 1 or negative. -Labs done on 10/14/2020 are WNL. -She will follow-up in 12 months with repeat labs and mammogram.  2.  Right renal cell carcinoma: -She had a T1 a NX RCC which was treated with partial nephrectomy. -Last CT scan on 10/01/2017 showed no recurrence.  Hepatic steatosis was seen. -January 2021 she had repeat CT scans which was reportedly normal.  She was released by Dr. Tresa Moore  3.  Vitamin D deficiency: -Labs done on 10/14/2020 show a vitamin D level of 74.35. -Continue 50,000 units weekly of vitamin D.  4.  Dizziness with standing: -Likely secondary to dehydration. -Slight drop in sodium level -Encouraged hydration  5.  Fatigue: -Lab work is stable. -Discussed sleep habits. -She has had a sleep study in the past which showed mild sleep apnea. -Recommend OTC melatonin to help fall asleep.  If symptoms worsen or are persistent would consider second sleep study to further assess.  Disposition: -RTC in 1 year with repeat lab work, mammogram and MD assessment.   No problem-specific Assessment & Plan notes found for this encounter.   Orders placed this encounter:  Orders Placed This Encounter  Procedures  . MM 3D SCREEN BREAST UNI RIGHT   Faythe Casa, NP 10/15/2020  1:20 PM  [place] [phone]

## 2020-10-22 ENCOUNTER — Other Ambulatory Visit: Payer: Self-pay | Admitting: Family Medicine

## 2020-10-22 ENCOUNTER — Other Ambulatory Visit (HOSPITAL_COMMUNITY): Payer: Self-pay | Admitting: Family Medicine

## 2020-10-22 DIAGNOSIS — R519 Headache, unspecified: Secondary | ICD-10-CM

## 2020-10-30 ENCOUNTER — Ambulatory Visit (HOSPITAL_COMMUNITY)
Admission: RE | Admit: 2020-10-30 | Discharge: 2020-10-30 | Disposition: A | Discharge status: Home / Self Care | Payer: Medicare Other | Source: Ambulatory Visit | Attending: Family Medicine | Admitting: Family Medicine

## 2020-10-30 ENCOUNTER — Other Ambulatory Visit: Payer: Self-pay

## 2020-10-30 DIAGNOSIS — R519 Headache, unspecified: Secondary | ICD-10-CM | POA: Diagnosis not present

## 2020-11-18 DIAGNOSIS — E785 Hyperlipidemia, unspecified: Secondary | ICD-10-CM | POA: Diagnosis not present

## 2020-11-18 DIAGNOSIS — I10 Essential (primary) hypertension: Secondary | ICD-10-CM | POA: Diagnosis not present

## 2020-11-18 DIAGNOSIS — E039 Hypothyroidism, unspecified: Secondary | ICD-10-CM | POA: Diagnosis not present

## 2020-11-18 DIAGNOSIS — M159 Polyosteoarthritis, unspecified: Secondary | ICD-10-CM | POA: Diagnosis not present

## 2020-12-16 DIAGNOSIS — E039 Hypothyroidism, unspecified: Secondary | ICD-10-CM | POA: Diagnosis not present

## 2020-12-16 DIAGNOSIS — K219 Gastro-esophageal reflux disease without esophagitis: Secondary | ICD-10-CM | POA: Diagnosis not present

## 2020-12-16 DIAGNOSIS — N3281 Overactive bladder: Secondary | ICD-10-CM | POA: Diagnosis not present

## 2020-12-16 DIAGNOSIS — E785 Hyperlipidemia, unspecified: Secondary | ICD-10-CM | POA: Diagnosis not present

## 2020-12-16 DIAGNOSIS — C50411 Malignant neoplasm of upper-outer quadrant of right female breast: Secondary | ICD-10-CM | POA: Diagnosis not present

## 2020-12-16 DIAGNOSIS — I10 Essential (primary) hypertension: Secondary | ICD-10-CM | POA: Diagnosis not present

## 2021-01-01 DIAGNOSIS — Z0000 Encounter for general adult medical examination without abnormal findings: Secondary | ICD-10-CM | POA: Diagnosis not present

## 2021-01-01 DIAGNOSIS — Z0001 Encounter for general adult medical examination with abnormal findings: Secondary | ICD-10-CM | POA: Diagnosis not present

## 2021-01-01 DIAGNOSIS — I10 Essential (primary) hypertension: Secondary | ICD-10-CM | POA: Diagnosis not present

## 2021-01-30 ENCOUNTER — Other Ambulatory Visit: Payer: Self-pay | Admitting: Orthopedic Surgery

## 2021-03-10 DIAGNOSIS — E039 Hypothyroidism, unspecified: Secondary | ICD-10-CM | POA: Diagnosis not present

## 2021-03-10 DIAGNOSIS — E785 Hyperlipidemia, unspecified: Secondary | ICD-10-CM | POA: Diagnosis not present

## 2021-03-10 DIAGNOSIS — R0789 Other chest pain: Secondary | ICD-10-CM | POA: Diagnosis not present

## 2021-03-10 DIAGNOSIS — I10 Essential (primary) hypertension: Secondary | ICD-10-CM | POA: Diagnosis not present

## 2021-03-18 ENCOUNTER — Other Ambulatory Visit: Payer: Self-pay

## 2021-03-18 MED ORDER — POTASSIUM CHLORIDE ER 10 MEQ PO TBCR: 10.0000 meq | EXTENDED_RELEASE_TABLET | Freq: Every day | ORAL | 3 refills | Status: DC

## 2021-03-19 ENCOUNTER — Telehealth: Payer: Self-pay | Admitting: Orthopedic Surgery

## 2021-03-19 NOTE — Telephone Encounter (Signed)
Give her the op notes it is in there

## 2021-03-19 NOTE — Telephone Encounter (Signed)
Called patient; left message for patient. (I had initially discussed with patient about signing a release form for the operative notes). Will discuss when patient returns call.

## 2021-03-19 NOTE — Telephone Encounter (Signed)
Call received from patient with question about the manufacturer's name and type of knee implant which was done on each knee. States she has continuing pain in her left knee. We have scheduled patient for next week for a follow up appointment with Xrays, as last visit with Xrays 07/11/18.  Please advise regarding implant-related question. Home ph# [phone]

## 2021-03-20 DIAGNOSIS — E039 Hypothyroidism, unspecified: Secondary | ICD-10-CM | POA: Diagnosis not present

## 2021-03-20 DIAGNOSIS — I10 Essential (primary) hypertension: Secondary | ICD-10-CM | POA: Diagnosis not present

## 2021-03-20 DIAGNOSIS — M159 Polyosteoarthritis, unspecified: Secondary | ICD-10-CM | POA: Diagnosis not present

## 2021-03-27 ENCOUNTER — Ambulatory Visit: Payer: Medicare Other

## 2021-03-27 ENCOUNTER — Ambulatory Visit: Payer: Medicare Other | Admitting: Orthopedic Surgery

## 2021-03-27 ENCOUNTER — Other Ambulatory Visit: Payer: Self-pay

## 2021-03-27 ENCOUNTER — Encounter: Payer: Self-pay | Admitting: Orthopedic Surgery

## 2021-03-27 VITALS — BP 124/74 | HR 82 | Ht 66.0 in | Wt 213.0 lb

## 2021-03-27 DIAGNOSIS — Z96651 Presence of right artificial knee joint: Secondary | ICD-10-CM | POA: Diagnosis not present

## 2021-03-27 DIAGNOSIS — M170 Bilateral primary osteoarthritis of knee: Secondary | ICD-10-CM

## 2021-03-27 DIAGNOSIS — Z96652 Presence of left artificial knee joint: Secondary | ICD-10-CM | POA: Diagnosis not present

## 2021-03-27 DIAGNOSIS — M7632 Iliotibial band syndrome, left leg: Secondary | ICD-10-CM

## 2021-03-27 MED ORDER — PREDNISONE 10 MG PO TABS: ORAL_TABLET | ORAL | 0 refills | Status: DC

## 2021-03-27 NOTE — Progress Notes (Signed)
FOLLOW UP   Encounter Diagnoses  Name Primary?   S/P total knee replacement, left 08/08/14 Yes   S/P total knee replacement, right 01/24/14    Primary osteoarthritis of both knees      Chief Complaint  Patient presents with   Post-op Follow-up    Bilateral knees left painful "since surgery" right has occasional pain left 08/08/14  right 01/24/14    Meds ordered this encounter  Medications   predniSONE (DELTASONE) 10 MG tablet    Sig: 30 mg qd prn    Dispense:  90 tablet    Refill:  0   predniSONE (DELTASONE) 10 MG tablet    Sig: 30 mg qd prn    Dispense:  30 tablet    Refill:  0       Continues with pain in her left knee really since surgery.  The pain is on the lateral side of the knee and tracks towards the rear portion of the tibia she also complains of bilateral knee swelling intermittently  Excellent range of motion is maintained in both knees on the left side we do find tenderness along the iliotibial band  Both knees remained stable  X-rays show normal alignment of both knees  No loosening  Recommend injection for iliotibial band tendinitis  Also recommend medication for chronic inflammation  Procedure injection steroid left knee iliotibial band  Medication Celestone 6 mg lidocaine 1% 2 cc  Injection site confirmed patient gave permission injection given  Follow-up as needed

## 2021-04-14 ENCOUNTER — Other Ambulatory Visit: Payer: Self-pay | Admitting: Cardiovascular Disease

## 2021-04-21 ENCOUNTER — Other Ambulatory Visit: Payer: Self-pay

## 2021-05-21 DIAGNOSIS — M159 Polyosteoarthritis, unspecified: Secondary | ICD-10-CM | POA: Diagnosis not present

## 2021-05-21 DIAGNOSIS — E785 Hyperlipidemia, unspecified: Secondary | ICD-10-CM | POA: Diagnosis not present

## 2021-05-21 DIAGNOSIS — E039 Hypothyroidism, unspecified: Secondary | ICD-10-CM | POA: Diagnosis not present

## 2021-05-21 DIAGNOSIS — I10 Essential (primary) hypertension: Secondary | ICD-10-CM | POA: Diagnosis not present

## 2021-05-23 ENCOUNTER — Other Ambulatory Visit: Payer: Self-pay

## 2021-05-23 MED ORDER — HYDROCHLOROTHIAZIDE 25 MG PO TABS: 12.5000 mg | ORAL_TABLET | Freq: Every day | ORAL | 0 refills | Status: DC

## 2021-05-23 NOTE — Telephone Encounter (Signed)
Medication e-sent to [facility] . Patient has an appointment 10 /22 with Dr Claiborne Billings

## 2021-06-10 ENCOUNTER — Other Ambulatory Visit: Payer: Self-pay | Admitting: Orthopedic Surgery

## 2021-06-20 DIAGNOSIS — I10 Essential (primary) hypertension: Secondary | ICD-10-CM | POA: Diagnosis not present

## 2021-06-20 DIAGNOSIS — E785 Hyperlipidemia, unspecified: Secondary | ICD-10-CM | POA: Diagnosis not present

## 2021-06-20 DIAGNOSIS — E039 Hypothyroidism, unspecified: Secondary | ICD-10-CM | POA: Diagnosis not present

## 2021-06-20 DIAGNOSIS — M159 Polyosteoarthritis, unspecified: Secondary | ICD-10-CM | POA: Diagnosis not present

## 2021-06-23 MED ORDER — HYDROCHLOROTHIAZIDE 25 MG PO TABS: 25.0000 mg | ORAL_TABLET | Freq: Every day | ORAL | 0 refills | Status: DC

## 2021-07-13 ENCOUNTER — Other Ambulatory Visit: Payer: Self-pay | Admitting: Cardiovascular Disease

## 2021-07-14 DIAGNOSIS — E039 Hypothyroidism, unspecified: Secondary | ICD-10-CM | POA: Diagnosis not present

## 2021-07-14 DIAGNOSIS — I10 Essential (primary) hypertension: Secondary | ICD-10-CM | POA: Diagnosis not present

## 2021-07-14 DIAGNOSIS — R140 Abdominal distension (gaseous): Secondary | ICD-10-CM | POA: Diagnosis not present

## 2021-07-14 DIAGNOSIS — M7121 Synovial cyst of popliteal space [Baker], right knee: Secondary | ICD-10-CM | POA: Diagnosis not present

## 2021-07-14 DIAGNOSIS — R5383 Other fatigue: Secondary | ICD-10-CM | POA: Diagnosis not present

## 2021-07-17 ENCOUNTER — Ambulatory Visit: Payer: Medicare Other | Admitting: Cardiovascular Disease

## 2021-07-17 ENCOUNTER — Encounter: Payer: Self-pay | Admitting: Cardiovascular Disease

## 2021-07-17 ENCOUNTER — Other Ambulatory Visit: Payer: Self-pay

## 2021-07-17 VITALS — BP 111/71 | HR 73 | Ht 66.0 in | Wt 224.8 lb

## 2021-07-17 DIAGNOSIS — I10 Essential (primary) hypertension: Secondary | ICD-10-CM

## 2021-07-17 DIAGNOSIS — I2584 Coronary atherosclerosis due to calcified coronary lesion: Secondary | ICD-10-CM

## 2021-07-17 DIAGNOSIS — R0789 Other chest pain: Secondary | ICD-10-CM | POA: Diagnosis not present

## 2021-07-17 DIAGNOSIS — I2510 Atherosclerotic heart disease of native coronary artery without angina pectoris: Secondary | ICD-10-CM

## 2021-07-17 DIAGNOSIS — M25471 Effusion, right ankle: Secondary | ICD-10-CM | POA: Diagnosis not present

## 2021-07-17 DIAGNOSIS — M25472 Effusion, left ankle: Secondary | ICD-10-CM | POA: Diagnosis not present

## 2021-07-17 DIAGNOSIS — R609 Edema, unspecified: Secondary | ICD-10-CM

## 2021-07-17 DIAGNOSIS — R0602 Shortness of breath: Secondary | ICD-10-CM

## 2021-07-17 DIAGNOSIS — E785 Hyperlipidemia, unspecified: Secondary | ICD-10-CM

## 2021-07-17 MED ORDER — FUROSEMIDE 20 MG PO TABS: 20.0000 mg | ORAL_TABLET | Freq: Every day | ORAL | 3 refills | Status: DC

## 2021-07-17 NOTE — Patient Instructions (Signed)
Medication Instructions:  STOP TAKING HYDROCHLOROTHIAZIDE.  START TAKING FUROSEMIDE 20 MG ONCE A DAY.   *If you need a refill on your cardiac medications before your next appointment, please call your pharmacy*   Testing/Procedures: Your physician has requested that you have an echocardiogram. Echocardiography is a painless test that uses sound waves to create images of your heart. It provides your doctor with information about the size and shape of your heart and how well your heart's chambers and valves are working. This procedure takes approximately one hour. There are no restrictions for this procedure. [place]; [address]   Follow-Up: At [facility], you and your health needs are our priority.  As part of our continuing mission to provide you with exceptional heart care, we have created designated Provider Care Teams.  These Care Teams include your primary Cardiologist (physician) and Advanced Practice Providers (APPs -  Physician Assistants and Nurse Practitioners) who all work together to provide you with the care you need, when you need it.  We recommend signing up for the patient portal called "MyChart".  Sign up information is provided on this After Visit Summary.  MyChart is used to connect with patients for Virtual Visits (Telemedicine).  Patients are able to view lab/test results, encounter notes, upcoming appointments, etc.  Non-urgent messages can be sent to your provider as well.   To learn more about what you can do with MyChart, go to [email or web address].    Your next appointment:   6 month(s)  The format for your next appointment:   In Person  Provider:   Shelva Majestic, MD

## 2021-07-17 NOTE — Progress Notes (Signed)
 Cardiology Office Note    Date:  07/19/2021   ID:  Bethel B Cubit, DOB 05/01/1946, MRN [id number]  PCP:  Hill, Gerald, MD  Cardiologist:  Thomas Kelly, MD   One year follow-up cardiology office visit   History of Present Illness:  Sara [name] is a 75 y.o. female who had remotely seen Dr. Nishan in 2014.  I saw her for initial evaluation with me on June 14, 2020.  She presents for a 13-month follow-up evaluation   [name] has a history of hypertension, hyperlipidemia, GERD, hypothyroidism, breast and renal cell CA and is status post right mastectomy.  She has a history of mild obstructive sleep apnea not on therapy and has experienced episodes of atypical chest pain.  She was admitted to the hospital in June 2021 with constant left lateral upper pectoral infra chest pain inferior to her shoulder.  Her pain was constant and at times waxed and waned in intensity.  It was not exertionally precipitated.  She presented to the emergency room.  Her EKG was negative for ischemic change.  Troponins were negative.  Laboratory was notable for hypokalemia with potassium of 2.9.  Her ECG shows sinus rhythm with left axis deviation without ectopy or ST-T changes.  D-dimer was upper normal.  It was felt that her chest pain was noncardiac in etiology.  I had seen her for consultation in the hospital and recommended initial noninvasive assessment with coronary CTA I recommended discontinuance of hydrochlorothiazide.  An echo Doppler study on March 05, 2020 showed an EF of 65% without wall motion abnormalities.  There was grade 1 diastolic dysfunction.  There were normal pulmonary pressures.  There was no significant valvular abnormality.  A coronary CTA showed a calcium score at 94, representing 74th percentile for age and sex matched control.  There was mild coronary plaque in the LAD of 0 to 24%.  He was heavy calcification in the RCA creating a potential blooming artifact.  FFR analysis was  normal.  Ms. Farris subsequently saw a Rhonda Barrett in the office for follow-up evaluation.  I saw her for my initial evaluation in the office with me on June 14, 2020.  At that time, she was experiencing occasional  left-sided chest wall tenderness.  She denied any exertional chest pain.  She developed Covid in June/July 2020.  Subsequently she did not get vaccinated.  At times she had mild swelling in her legs and has been wearing support stockings.  She sees Dr. Gerald Hill for primary care.  During that evaluation, she had costochondral tenderness which mimicked her chest pain consistent with musculoskeletal etiology.  Over the past year, she has continued to be evaluated by Dr. Katragadda at [facility].  She denies any exertional chest pain.  At times she notes a rare sensation of a pulling sensation across her chest which is nonexertional.  She does experience some mild shortness of breath with walking.  She has noted some mild ankle swelling.  She is on a regimen consisting of carvedilol 3.125 mg twice a day, and HCTZ 25 mg daily.  She is no longer on amlodipine for hypertension.  She is on levothyroxine 88 mcg for hypothyroidism, takes omeprazole for GERD, and sertraline.  She presents for evaluation.  Past Medical History:  Diagnosis Date   Allergic rhinitis due to pollen    Anxiety    Arthritis    "knees, back, right elbow; left shoulder; right ankle" (11/20/2014)   Breast cancer,   right breast (HCC) 2016   "DCIS; zero stage" S/P mastectomy, cancer free since then   Cervical spondylosis without myelopathy    Cervicalgia    Chronic lower back pain    Constipation    takes Miralax daily   Degeneration of cervical intervertebral disc    Depression    Diverticulosis of colon (without mention of hemorrhage)    GERD (gastroesophageal reflux disease)    takes Omeprazole daily   Graves' disease    "took a pill to correct"   H/O hiatal hernia    H/O urinary frequency     Headache(784.0)    denies migraines since the 80's but has occ and takes Butalbital prn   HTN (hypertension)    takes Metoprolol and Lisinopril daily   Insomnia    takes Ativan nigtly   Joint pain    "all of them"   Joint swelling    "knees, legs, ankles sometimes" (11/20/2014)   Morbid obesity (HCC)    Numbness    LOWER LEGS   Other postablative hypothyroidism    takes SYnthroid daily   Palpitations    Peptic ulcer, unspecified site, unspecified as acute or chronic, without mention of hemorrhage, perforation, or obstruction    Primary localized osteoarthrosis, lower leg    Pure hypercholesterolemia    takes Pravastin daily   Recurrent UTI    Renal cell carcinoma (HCC) 01/08/2016   Right renal mass s/p partial right nephrectomy, cancer free since 2017   Shortness of breath    Sleep apnea    slight but doesn't require a CPAP (11/20/2014)   Tendonitis of knee    bilateral    Past Surgical History:  Procedure Laterality Date   ABDOMINAL HYSTERECTOMY     ANTERIOR LAT LUMBAR FUSION  05/13/2012   Procedure: ANTERIOR LATERAL LUMBAR FUSION 2 LEVELS;  Surgeon: Randy O Kritzer, MD;  Location: [facility];  Service: Neurosurgery;  Laterality: Left;  Left Lumbar Three-four,Lumbar four-five Extreme Lumbar Interbody Fusion with Percutaneous Pedicle Screws   APPENDECTOMY     BREAST BIOPSY Left ~ 2014   BREAST BIOPSY Right 2015   BREAST LUMPECTOMY Left 1988   BREAST RECONSTRUCTION WITH PLACEMENT OF TISSUE EXPANDER AND FLEX HD (ACELLULAR HYDRATED DERMIS) Right 11/20/2014   BREAST RECONSTRUCTION WITH PLACEMENT OF TISSUE EXPANDER AND FLEX HD (ACELLULAR HYDRATED DERMIS) Right 11/20/2014   Procedure: RIGHT BREAST RECONSTRUCTION WITH PLACEMENT OF TISSUE EXPANDER AND ACELLULAR DERMA MATRIX (ACELLULAR DERMA MATRIX);  Surgeon: David Bowers, MD;  Location: [facility];  Service: Plastics;  Laterality: Right;   CATARACT EXTRACTION W/ INTRAOCULAR LENS  IMPLANT, BILATERAL Bilateral    COLONOSCOPY  Sept 2008    RMR: normal rectum, left-sided diverticula, repeat in 2018   COLONOSCOPY WITH PROPOFOL N/A 03/21/2018   Dr. Rourk: diverticulosis, internal grade I hemorrhoids. no future colonoscopies unless develops new symptoms   DIAGNOSTIC LAPAROSCOPY     DILATION AND CURETTAGE OF UTERUS     ESOPHAGOGASTRODUODENOSCOPY     ESOPHAGOGASTRODUODENOSCOPY  2013   Dr. Rourk: Cervical esophageal web and Schatzki's ring s/p dilation, small hiatal hernia, antral and duodenal erosions likely NSAID effect, chronic duodenitis on path    ESOPHAGOGASTRODUODENOSCOPY (EGD) WITH PROPOFOL N/A 03/21/2018   Dr. Rourk: mild Schatzki ring s/p dilation   FOREIGN BODY REMOVAL Right 10/06/2013   Procedure: REMOVAL FOREIGN BODY EXTREMITY;  Surgeon: Stanley E Harrison, MD;  Location: [facility];  Service: Orthopedics;  Laterality: Right;   INJECTION KNEE Left 10/06/2013   Procedure: KNEE INJECTION;  Surgeon:   Stanley E Harrison, MD;  Location: [facility];  Service: Orthopedics;  Laterality: Left;   JOINT REPLACEMENT Bilateral    knees   KNEE ARTHROSCOPY Right    KNEE ARTHROSCOPY WITH LATERAL MENISECTOMY Right 10/06/2013   Procedure: KNEE ARTHROSCOPY WITH LATERAL AND MEDIAL MENISECTOMY;  Surgeon: Stanley E Harrison, MD;  Location: [facility];  Service: Orthopedics;  Laterality: Right;  END @ 1234   lumpectomy on pelvis     "mass of nerves"   MALONEY DILATION N/A 03/21/2018   Procedure: MALONEY DILATION;  Surgeon: Rourk, Robert M, MD;  Location: [facility];  Service: Endoscopy;  Laterality: N/A;   MASTECTOMY COMPLETE / SIMPLE W/ SENTINEL NODE BIOPSY Right 11/20/2014   axillary   REMOVAL OF TISSUE EXPANDER AND PLACEMENT OF IMPLANT Right 12/13/2014   Procedure: REMOVAL OF RIGHT BREAST TISSUE EXPANDER;  Surgeon: David Bowers, MD;  Location: [facility];  Service: Plastics;  Laterality: Right;   ROBOTIC ASSITED PARTIAL NEPHRECTOMY Right 01/08/2016   Procedure: XI ROBOTIC ASSITED PARTIAL NEPHRECTOMY;  Surgeon: Theodore Manny, MD;  Location: [facility];  Service:  Urology;  Laterality: Right;   SIMPLE MASTECTOMY WITH AXILLARY SENTINEL NODE BIOPSY Right 11/20/2014   Procedure: SIMPLE MASTECTOMY WITH AXILLARY SENTINEL NODE BIOPSY;  Surgeon: Nabeel Gladson Cornett, MD;  Location: [facility];  Service: General;  Laterality: Right;   TISSUE EXPANDER REMOVAL Right 12/13/2014   dr bowers   TONSILLECTOMY     TOTAL KNEE ARTHROPLASTY Right 01/24/2014   Procedure: TOTAL KNEE ARTHROPLASTY;  Surgeon: Stanley E Harrison, MD;  Location: [facility];  Service: Orthopedics;  Laterality: Right;   TOTAL KNEE ARTHROPLASTY Left 08/08/2014   Procedure: TOTAL KNEE ARTHROPLASTY;  Surgeon: Stanley E Harrison, MD;  Location: [facility];  Service: Orthopedics;  Laterality: Left;   TUBAL LIGATION     UPPER GASTROINTESTINAL ENDOSCOPY      Current Medications: Outpatient Medications Prior to Visit  Medication Sig Dispense Refill   acetaminophen (TYLENOL) 500 MG tablet Take 650 mg by mouth in the morning and at bedtime.     aspirin EC 81 MG tablet Take 81 mg by mouth daily. Swallow whole.     atorvastatin (LIPITOR) 20 MG tablet Take 1 tablet (20 mg total) by mouth daily. 30 tablet 11   carvedilol (COREG) 3.125 MG tablet TAKE 1 TABLET BY MOUTH  TWICE DAILY WITH MEALS 180 tablet 3   diphenhydrAMINE (BENADRYL) 50 MG capsule Take 50 mg by mouth at bedtime.     ergocalciferol (VITAMIN D2) 1.25 MG (50000 UT) capsule Take 1 capsule (50,000 Units total) by mouth once a week. 16 capsule 3   gabapentin (NEURONTIN) 600 MG tablet TAKE 1 TABLET BY MOUTH  TWICE DAILY 180 tablet 3   levothyroxine (SYNTHROID, LEVOTHROID) 88 MCG tablet Take 88 mcg by mouth daily before breakfast.      omeprazole (PRILOSEC) 20 MG capsule Take 20 mg by mouth daily. May take a second 20 mg dose as needed for heartburn     oxybutynin (DITROPAN) 5 MG tablet Take 5 mg by mouth 2 (two) times daily.      potassium chloride (KLOR-CON) 10 MEQ tablet Take 1 tablet (10 mEq total) by mouth daily. 90 tablet 3   predniSONE (DELTASONE) 10 MG tablet TAKE  3 TABLETS (30 MG) BY  MOUTH DAILY AS NEEDED 90 tablet 0   Probiotic Product (RESTORA PO) Take 1 tablet by mouth daily in the afternoon.     sertraline (ZOLOFT) 50 MG tablet Take 1 tablet (50 mg total) by   mouth daily. 90 tablet 3   hydrochlorothiazide (HYDRODIURIL) 25 MG tablet TAKE 1 TABLET BY MOUTH  DAILY (DISCONTINUE  AMLODIPINE) 30 tablet 11   meclizine (ANTIVERT) 25 MG tablet  (Patient not taking: Reported on 07/17/2021)     predniSONE (DELTASONE) 10 MG tablet 30 mg qd prn 30 tablet 0   No facility-administered medications prior to visit.     Allergies:   Propoxyphene hcl, Pollen extract, Camphor, Dust mite extract, Latex, Molds & smuts, and Tomato   Social History   Socioeconomic History   Marital status: Married    Spouse name: Leon   Number of children: 4   Years of education: Not on file   Highest education level: Not on file  Occupational History   Occupation: disability    Employer: UNEMPLOYED    Comment: since 2008, knee surgery  Tobacco Use   Smoking status: Former    Packs/day: 1.00    Years: 27.00    Pack years: 27.00    Types: Cigarettes    Quit date: 12/26/1985    Years since quitting: 35.5   Smokeless tobacco: Never   Tobacco comments:    Quit in 1987  Substance and Sexual Activity   Alcohol use: Yes    Alcohol/week: 0.0 standard drinks    Comment: occasional wine   Drug use: No   Sexual activity: Yes    Birth control/protection: Surgical  Other Topics Concern   Not on file  Social History Narrative   Currently married for 10 years. Husband's name is Leon. She is an excellent singer. 8 children between she and her husband. 23 grand children. 12 great grandchildren.   Social Determinants of Health   Financial Resource Strain: Not on file  Food Insecurity: Not on file  Transportation Needs: Not on file  Physical Activity: Not on file  Stress: Not on file  Social Connections: Not on file     Family History:  The patient's family history includes  Heart attack in her father, sister, sister, and sister; Lung cancer in her maternal grandmother; Obesity in her brother; Osteoarthritis in her brother and sister; Stroke in her sister.   ROS General: Negative; No fevers, chills, or night sweats;  HEENT: Negative; No changes in vision or hearing, sinus congestion, difficulty swallowing Pulmonary: Negative; No cough, wheezing, shortness of breath, hemoptysis Cardiovascular: See HPI GI: Negative; No nausea, vomiting, diarrhea, or abdominal pain GU: Negative; No dysuria, hematuria, or difficulty voiding Musculoskeletal: Negative; no myalgias, joint pain, or weakness Hematologic/Oncology: History of breast cancer, status post right mastectomy.  History of renal cancer. Endocrine: Negative; no heat/cold intolerance; no diabetes Neuro: Negative; no changes in balance, headaches Skin: Negative; No rashes or skin lesions Psychiatric: Negative; No behavioral problems, depression Sleep: Negative; No snoring, daytime sleepiness, hypersomnolence, bruxism, restless legs, hypnogognic hallucinations, no cataplexy Other comprehensive 14 point system review is negative.   PHYSICAL EXAM:   VS:  BP 111/71 (BP Location: Left Arm, Patient Position: Sitting, Cuff Size: Large)   Pulse 73   Ht 5' 6" (1.676 m)   Wt 224 lb 12.8 oz (102 kg)   SpO2 97%   BMI 36.28 kg/m     Repeat blood pressure by me was 114/70  Wt Readings from Last 3 Encounters:  07/17/21 224 lb 12.8 oz (102 kg)  03/27/21 213 lb (96.6 kg)  10/15/20 208 lb 1.6 oz (94.4 kg)     General: Alert, oriented, no distress.  Skin: normal turgor, no rashes, warm and dry HEENT:   Normocephalic, atraumatic. Pupils equal round and reactive to light; sclera anicteric; extraocular muscles intact;  Nose without nasal septal hypertrophy Mouth/Parynx benign; Mallinpatti scale 3 Neck: No JVD, no carotid bruits; normal carotid upstroke Lungs: clear to ausculatation and percussion; no wheezing or  rales Chest wall: without tenderness to palpitation Heart: PMI not displaced, RRR, s1 s2 normal, 1/6 systolic murmur, no diastolic murmur, no rubs, gallops, thrills, or heaves Abdomen: soft, nontender; no hepatosplenomehaly, BS+; abdominal aorta nontender and not dilated by palpation. Back: no CVA tenderness Pulses 2+ Musculoskeletal: full range of motion, normal strength, no joint deformities Extremities: 1+ bilateral ankle edema; no clubbing, cyanosis,  Homan's sign negative  Neurologic: grossly nonfocal; Cranial nerves grossly wnl Psychologic: Normal mood and affect   Studies/Labs Reviewed:   EKG:  EKG is ordered today.  ECG (independently read by me):  NSR at 73, LAE, LAD, Reduced anteriorly  September 24, 20212 ECG (independently read by me): Normal sinus rhythm at 62 bpm.  Possible left atrial enlargement.  Low voltage anteriorly.  Recent Labs: BMP Latest Ref Rng & Units 10/14/2020 06/07/2020 03/28/2020  Glucose 70 - 99 mg/dL 99 98 93  BUN 8 - 23 mg/dL _0 Creatinine 0.44 - 1.00 mg/dL 0.72 0.72 0.67  BUN/Creat Ratio 6 - 22 (calc) - NOT APPLICABLE -  Sodium 833 - 145 mmol/L 134(L) 137 140  Potassium 3.5 - 5.1 mmol/L 3.9 3.9 4.0  Chloride 98 - 111 mmol/L 100 102 105  CO2 22 - 32 mmol/L _1 Calcium 8.9 - 10.3 mg/dL 8.7(L) 8.8 8.7(L)     Hepatic Function Latest Ref Rng & Units 10/14/2020 03/28/2020 09/25/2019  Total Protein 6.5 - 8.1 g/dL 6.9 7.1 7.0  Albumin 3.5 - 5.0 g/dL 3.1(L) 3.4(L) 3.2(L)  AST 15 - 41 U/L 37 32 44(H)  ALT 0 - 44 U/L 29 30 35  Alk Phosphatase 38 - 126 U/L 73 53 44  Total Bilirubin 0.3 - 1.2 mg/dL 0.7 0.6 0.5  Bilirubin, Direct 0.0 - 0.3 mg/dL - - -    CBC Latest Ref Rng & Units 10/14/2020 03/28/2020 03/06/2020  WBC 4.0 - 10.5 K/uL 7.1 7.1 7.7  Hemoglobin 12.0 - 15.0 g/dL 13.8 13.0 12.1  Hematocrit 36.0 - 46.0 % 41.8 40.1 36.2  Platelets 150 - 400 K/uL 282 215 198   Lab Results  Component Value Date   MCV 97.7 10/14/2020   MCV 98.0 03/28/2020    MCV 96.3 03/06/2020   Lab Results  Component Value Date   TSH 1.504 03/04/2020   No results found for: HGBA1C   BNP No results found for: BNP  ProBNP No results found for: PROBNP   Lipid Panel     Component Value Date/Time   CHOL 141 06/07/2020 0833   TRIG 71 06/07/2020 0833   HDL 57 06/07/2020 0833   CHOLHDL 2.5 06/07/2020 0833   VLDL 14 03/06/2020 0420   LDLCALC 69 06/07/2020 0833     RADIOLOGY: No results found.   Additional studies/ records that were reviewed today include:  I reviewed the records from the patient's recent hospitalization as well as follow-up office visit with Rosaria Ferries, PA    ASSESSMENT:    1. Essential hypertension   2. SOB (shortness of breath)   3. Ankle edema, bilateral   4. Atypical chest pain   5. Coronary artery calcification   6. Hyperlipidemia with target LDL less than 70      PLAN:  [name] is a  pleasant 75-year-old female who has a history of hypertension,  hyperlipidemia, hypothyroidism, GERD, reported mild OSA not on therapy.  In the past, she has experienced episodes of atypical chest pain, mostly of musculoskeletal etiology.  A coronary CTA showed mild disease and I suspect there was blooming artifact in the RCA.  FFR analysis was normal.  On prior evaluation she has experienced costochondral tenderness.  Presently she denies any exertional symptomatology of chest pain.  She has experienced some mild shortness of breath with walking and on exam today has 1+ bilateral ankle edema.  Her blood pressure today is stable.  I have recommended she discontinue hydrochlorothiazide and in its place initiate furosemide 20 mg daily which should be helpful for her residual edema on HCTZ.  ECG is stable and she continues to be on carvedilol 3.125 mg twice a day.  Resting pulse today is in the 70s.  With her shortness of breath with walking, I have recommended she undergo an echo Doppler study to assess both systolic and diastolic  function as well as valvular architecture and LVH.  She continues to be on levothyroxine for hypothyroidism.  Lipid studies in September 2021 showed an LDL cholesterol at 69 on atorvastatin 20 mg.  She is followed by Dr. Gerald Hill for primary care who checks her laboratory.  She sees them every 3 months.  She is followed by Dr. Katragadda for her history of breast CVA.  She is on gabapentin for neuropathy.  I will contact her regarding her echo data.  I will see her in 6 months for reevaluation or sooner as needed.    Medication Adjustments/Labs and Tests Ordered: Current medicines are reviewed at length with the patient today.  Concerns regarding medicines are outlined above.  Medication changes, Labs and Tests ordered today are listed in the Patient Instructions below. Patient Instructions  Medication Instructions:  STOP TAKING HYDROCHLOROTHIAZIDE.  START TAKING FUROSEMIDE 20 MG ONCE A DAY.   *If you need a refill on your cardiac medications before your next appointment, please call your pharmacy*   Testing/Procedures: Your physician has requested that you have an echocardiogram. Echocardiography is a painless test that uses sound waves to create images of your heart. It provides your doctor with information about the size and shape of your heart and how well your heart's chambers and valves are working. This procedure takes approximately one hour. There are no restrictions for this procedure. [address]; [address]   Follow-Up: At [facility], you and your health needs are our priority.  As part of our continuing mission to provide you with exceptional heart care, we have created designated Provider Care Teams.  These Care Teams include your primary Cardiologist (physician) and Advanced Practice Providers (APPs -  Physician Assistants and Nurse Practitioners) who all work together to provide you with the care you need, when you need it.  We recommend signing up for the  patient portal called "MyChart".  Sign up information is provided on this After Visit Summary.  MyChart is used to connect with patients for Virtual Visits (Telemedicine).  Patients are able to view lab/test results, encounter notes, upcoming appointments, etc.  Non-urgent messages can be sent to your provider as well.   To learn more about what you can do with MyChart, go to [email or web address].com.    Your next appointment:   6 month(s)  The format for your next appointment:   In Person  Provider:   Thomas Kelly, MD       Signed, Thomas Kelly, MD  07/19/2021 12:23 PM    [facility] [address], [address], [place], [place]  [address] Phone: [phone]    

## 2021-07-19 ENCOUNTER — Encounter: Payer: Self-pay | Admitting: Cardiovascular Disease

## 2021-07-28 NOTE — Addendum Note (Signed)
Addended by: Orma Render on: 07/28/2021 02:04 PM   Modules accepted: Orders

## 2021-07-31 ENCOUNTER — Ambulatory Visit (HOSPITAL_COMMUNITY): Payer: Medicare Other | Attending: Cardiology

## 2021-07-31 ENCOUNTER — Other Ambulatory Visit: Payer: Self-pay

## 2021-07-31 DIAGNOSIS — R0602 Shortness of breath: Secondary | ICD-10-CM

## 2021-07-31 LAB — ECHOCARDIOGRAM COMPLETE
Area-P 1/2: 2.97 cm2
S' Lateral: 2.3 cm

## 2021-08-04 ENCOUNTER — Telehealth: Payer: Self-pay | Admitting: Cardiovascular Disease

## 2021-08-04 NOTE — Telephone Encounter (Signed)
Patient called in c/o of swelling in lower extremities that does not get better over night or with elevation when sitting. She stated when she pushes her finger into it it takes a few seconds for it to get back right, thinking +2 or +3 pitting edema. Patient has been aware of her diet with no extra salt, canned foods, or soft drinks. Patient stateds she has mild shortness of breath with activity but she is still able to lay in bed at night to sleep without issue. Patient stated she is taking her Lasix as order but has not see a change since starting it in October. She checks her BP once week and last Tuesday, 11/8 it was 135/81 HR 54. She does not check her weight daily but was reminded that should be checked and tracked regularly. She was able to get on the scale and her weight was 234.8lb. Patient scheduled to see Angie Duke on 11/17 @  3:45pm. No additional questions at this time. Patient made aware that if she starts to have increased Shortness of Breath or experienced syncope she is to report to the emergency room, she verbalized understanding no questions at this time.

## 2021-08-04 NOTE — Telephone Encounter (Signed)
  Per patient schedule message:   I don't really need an appt. I need to let Dr. Claiborne Billings know that I am still quite swollen even more so after switching to the [facility]  I can't figure out how to use the My Chart

## 2021-08-05 NOTE — Progress Notes (Deleted)
Cardiology Office Note:    Date:  08/05/2021   ID:  Sara [name], DOB [birth year], MRN [id number]  PCP:  [name], MD  Cardiologist:  Shelva Majestic, MD   Referring MD: [name], MD   No chief complaint on file. ***  History of Present Illness:    Sara [name] is a 75 y.o. female with a hx of hypertension, GERD, fatty liver, hypertension, hyperlipidemia, hypothyroidism, breast and renal cell cancer status post right mastectomy, OSA not on CPAP, and atypical chest pain.  She was admitted in June 2021 with chest pain.  Coronary CTA revealed a calcium score of 94 with heavy calcification in the RCA but resulted in FFR was negative.  Following her mastectomy, she does have MSK/atypical chest pain.  She was recently seen by Dr. Claiborne Billings on 07/17/2021.  She reported mild shortness of breath and 1+ bilateral ankle edema.  TTC was discontinued and she was started on 20 mg Lasix.  Echo was completed on 07/31/2021 and revealed LVEF of 55 to 60%, no regional wall motion abnormality, normal diastolic function, normal RV function, posterior pericardial effusion, and no significant valvular disease.  She presents today with ongoing dyspnea and leg edema.    Lower extremity swelling Dyspnea HCTZ was discontinued at last visit and she was started on 20 mg of Lasix -Home med list includes prednisone     Hypertension Continue carvedilol 3.125 mg twice daily    Hyperlipidemia Given coronary calcification seen on CT, would prefer LDL lower than 70 Continue 20 mg Lipitor Need updated lipid profile?     Nonobstructive CAD -Normal FFR on recent CCTA      Past Medical History:  Diagnosis Date   Allergic rhinitis due to pollen    Anxiety    Arthritis    "knees, back, right elbow; left shoulder; right ankle" (11/20/2014)   Breast cancer, right breast ([place]) 2016   "DCIS; zero stage" S/P mastectomy, cancer free since then   Cervical spondylosis without myelopathy     Cervicalgia    Chronic lower back pain    Constipation    takes Miralax daily   Degeneration of cervical intervertebral disc    Depression    Diverticulosis of colon (without mention of hemorrhage)    GERD (gastroesophageal reflux disease)    takes Omeprazole daily   Graves' disease    "took a pill to correct"   H/O hiatal hernia    H/O urinary frequency    Headache(784.0)    denies migraines since the 80's but has occ and takes Butalbital prn   HTN (hypertension)    takes Metoprolol and Lisinopril daily   Insomnia    takes Ativan nigtly   Joint pain    "all of them"   Joint swelling    "knees, legs, ankles sometimes" (11/20/2014)   Morbid obesity ([place])    Numbness    LOWER LEGS   Other postablative hypothyroidism    takes SYnthroid daily   Palpitations    Peptic ulcer, unspecified site, unspecified as acute or chronic, without mention of hemorrhage, perforation, or obstruction    Primary localized osteoarthrosis, lower leg    Pure hypercholesterolemia    takes Pravastin daily   Recurrent UTI    Renal cell carcinoma ([place]) 01/08/2016   Right renal mass s/p partial right nephrectomy, cancer free since 2017   Shortness of breath    Sleep apnea    slight but doesn't require a CPAP (11/20/2014)   Tendonitis of  knee    bilateral    Past Surgical History:  Procedure Laterality Date   ABDOMINAL HYSTERECTOMY     ANTERIOR LAT LUMBAR FUSION  05/13/2012   Procedure: ANTERIOR LATERAL LUMBAR FUSION 2 LEVELS;  Surgeon: Faythe Ghee, MD;  Location: [facility];  Service: Neurosurgery;  Laterality: Left;  Left Lumbar Three-four,Lumbar four-five Extreme Lumbar Interbody Fusion with Percutaneous Pedicle Screws   APPENDECTOMY     BREAST BIOPSY Left ~ 2014   BREAST BIOPSY Right 2015   BREAST LUMPECTOMY Left 1988   BREAST RECONSTRUCTION WITH PLACEMENT OF TISSUE EXPANDER AND FLEX HD (ACELLULAR HYDRATED DERMIS) Right 11/20/2014   BREAST RECONSTRUCTION WITH PLACEMENT OF TISSUE EXPANDER AND  FLEX HD (ACELLULAR HYDRATED DERMIS) Right 11/20/2014   Procedure: RIGHT BREAST RECONSTRUCTION WITH PLACEMENT OF TISSUE EXPANDER AND ACELLULAR DERMA MATRIX (ACELLULAR DERMA MATRIX);  Surgeon: Crissie Reese, MD;  Location: [facility] [facility];  Service: Plastics;  Laterality: Right;   CATARACT EXTRACTION W/ INTRAOCULAR LENS  IMPLANT, BILATERAL Bilateral    COLONOSCOPY  Sept 2008   RMR: normal rectum, left-sided diverticula, repeat in 2018   COLONOSCOPY WITH PROPOFOL N/A 03/21/2018   Dr. Gala Romney: diverticulosis, internal grade I hemorrhoids. no future colonoscopies unless develops new symptoms   DIAGNOSTIC LAPAROSCOPY     DILATION AND CURETTAGE OF UTERUS     ESOPHAGOGASTRODUODENOSCOPY     ESOPHAGOGASTRODUODENOSCOPY  2013   Dr. Gala Romney: Cervical esophageal web and Schatzki's ring s/p dilation, small hiatal hernia, antral and duodenal erosions likely NSAID effect, chronic duodenitis on path    ESOPHAGOGASTRODUODENOSCOPY (EGD) WITH PROPOFOL N/A 03/21/2018   Dr. Gala Romney: mild Schatzki ring s/p dilation   FOREIGN BODY REMOVAL Right 10/06/2013   Procedure: REMOVAL FOREIGN BODY EXTREMITY;  Surgeon: Carole Civil, MD;  Location: [facility];  Service: Orthopedics;  Laterality: Right;   INJECTION KNEE Left 10/06/2013   Procedure: KNEE INJECTION;  Surgeon: Carole Civil, MD;  Location: [facility];  Service: Orthopedics;  Laterality: Left;   JOINT REPLACEMENT Bilateral    knees   KNEE ARTHROSCOPY Right    KNEE ARTHROSCOPY WITH LATERAL MENISECTOMY Right 10/06/2013   Procedure: KNEE ARTHROSCOPY WITH LATERAL AND MEDIAL MENISECTOMY;  Surgeon: Carole Civil, MD;  Location: [facility];  Service: Orthopedics;  Laterality: Right;  END @ 1234   lumpectomy on pelvis     "mass of nerves"   [place] N/A 03/21/2018   Procedure: MALONEY DILATION;  Surgeon: Daneil Dolin, MD;  Location: [facility];  Service: Endoscopy;  Laterality: N/A;   MASTECTOMY COMPLETE / SIMPLE W/ SENTINEL NODE BIOPSY Right 11/20/2014   axillary   REMOVAL OF  TISSUE EXPANDER AND PLACEMENT OF IMPLANT Right 12/13/2014   Procedure: REMOVAL OF RIGHT BREAST TISSUE EXPANDER;  Surgeon: Crissie Reese, MD;  Location: [facility];  Service: Plastics;  Laterality: Right;   ROBOTIC ASSITED PARTIAL NEPHRECTOMY Right 01/08/2016   Procedure: XI ROBOTIC ASSITED PARTIAL NEPHRECTOMY;  Surgeon: Alexis Frock, MD;  Location: [facility];  Service: Urology;  Laterality: Right;   SIMPLE MASTECTOMY WITH AXILLARY SENTINEL NODE BIOPSY Right 11/20/2014   Procedure: SIMPLE MASTECTOMY WITH AXILLARY SENTINEL NODE BIOPSY;  Surgeon: Erroll Luna, MD;  Location: [place];  Service: General;  Laterality: Right;   TISSUE EXPANDER REMOVAL Right 12/13/2014   dr Harlow Mares   TONSILLECTOMY     TOTAL KNEE ARTHROPLASTY Right 01/24/2014   Procedure: TOTAL KNEE ARTHROPLASTY;  Surgeon: Carole Civil, MD;  Location: [facility];  Service: Orthopedics;  Laterality: Right;   TOTAL KNEE ARTHROPLASTY  Left 08/08/2014   Procedure: TOTAL KNEE ARTHROPLASTY;  Surgeon: Carole Civil, MD;  Location: [facility];  Service: Orthopedics;  Laterality: Left;   TUBAL LIGATION     UPPER GASTROINTESTINAL ENDOSCOPY      Current Medications: No outpatient medications have been marked as taking for the 08/07/21 encounter (Appointment) with Ledora Bottcher, [place].     Allergies:   Propoxyphene hcl, Pollen extract, Camphor, Dust mite extract, Latex, Molds & smuts, and Tomato   Social History   Socioeconomic History   Marital status: Married    Spouse name: Milbert Coulter   Number of children: 4   Years of education: Not on file   Highest education level: Not on file  Occupational History   Occupation: disability    Employer: UNEMPLOYED    Comment: since 2008, knee surgery  Tobacco Use   Smoking status: Former    Packs/day: 1.00    Years: 27.00    Pack years: 27.00    Types: Cigarettes    Quit date: 12/26/1985    Years since quitting: 35.6   Smokeless tobacco: Never   Tobacco comments:    Quit in 1987  Substance and Sexual  Activity   Alcohol use: Yes    Alcohol/week: 0.0 standard drinks    Comment: occasional wine   Drug use: No   Sexual activity: Yes    Birth control/protection: Surgical  Other Topics Concern   Not on file  Social History Narrative   Currently married for 10 years. Husband's name is Milbert Coulter. She is an excellent singer. 8 children between she and her husband. 23 grand children. 12 great grandchildren.   Social Determinants of Health   Financial Resource Strain: Not on file  Food Insecurity: Not on file  Transportation Needs: Not on file  Physical Activity: Not on file  Stress: Not on file  Social Connections: Not on file     Family History: The patient's ***family history includes Heart attack in her father, sister, sister, and sister; Lung cancer in her maternal grandmother; Obesity in her brother; Osteoarthritis in her brother and sister; Stroke in her sister. There is no history of Colon cancer.  ROS:   Please see the history of present illness.    *** All other systems reviewed and are negative.  EKGs/Labs/Other Studies Reviewed:    The following studies were reviewed today: ***  EKG:  EKG is *** ordered today.  The ekg ordered today demonstrates ***  Recent Labs: 10/14/2020: ALT 29; BUN 12; Creatinine, Ser 0.72; Hemoglobin 13.8; Platelets 282; Potassium 3.9; Sodium 134  Recent Lipid Panel    Component Value Date/Time   CHOL 141 06/07/2020 0833   TRIG 71 06/07/2020 0833   HDL 57 06/07/2020 0833   CHOLHDL 2.5 06/07/2020 0833   VLDL 14 03/06/2020 0420   LDLCALC 69 06/07/2020 0833    Physical Exam:    VS:  There were no vitals taken for this visit.    Wt Readings from Last 3 Encounters:  07/17/21 224 lb 12.8 oz (102 kg)  03/27/21 213 lb (96.6 kg)  10/15/20 208 lb 1.6 oz (94.4 kg)     GEN: *** Well nourished, well developed in no acute distress HEENT: Normal NECK: No JVD; No carotid bruits LYMPHATICS: No lymphadenopathy CARDIAC: ***RRR, no murmurs, rubs,  gallops RESPIRATORY:  Clear to auscultation without rales, wheezing or rhonchi  ABDOMEN: Soft, non-tender, non-distended MUSCULOSKELETAL:  No edema; No deformity  SKIN: Warm and dry NEUROLOGIC:  Alert and oriented x  3 PSYCHIATRIC:  Normal affect   ASSESSMENT:    No diagnosis found. PLAN:    In order of problems listed above:  No diagnosis found.   Medication Adjustments/Labs and Tests Ordered: Current medicines are reviewed at length with the patient today.  Concerns regarding medicines are outlined above.  No orders of the defined types were placed in this encounter.  No orders of the defined types were placed in this encounter.   Signed, Ledora Bottcher, [place]  08/05/2021 9:31 PM    [facility]

## 2021-08-06 ENCOUNTER — Other Ambulatory Visit (HOSPITAL_COMMUNITY): Payer: Self-pay | Admitting: *Deleted

## 2021-08-06 DIAGNOSIS — E559 Vitamin D deficiency, unspecified: Secondary | ICD-10-CM

## 2021-08-06 MED ORDER — ERGOCALCIFEROL 1.25 MG (50000 UT) PO CAPS: 50000.0000 [IU] | ORAL_CAPSULE | ORAL | 3 refills | Status: DC

## 2021-08-07 ENCOUNTER — Other Ambulatory Visit: Payer: Self-pay

## 2021-08-07 ENCOUNTER — Ambulatory Visit: Payer: Medicare Other | Admitting: Nurse Practitioner

## 2021-08-07 ENCOUNTER — Ambulatory Visit (INDEPENDENT_AMBULATORY_CARE_PROVIDER_SITE_OTHER): Payer: Medicare Other

## 2021-08-07 ENCOUNTER — Encounter: Payer: Self-pay | Admitting: Physician Assistant

## 2021-08-07 VITALS — BP 128/76 | HR 61 | Ht 66.0 in | Wt 236.8 lb

## 2021-08-07 DIAGNOSIS — I5033 Acute on chronic diastolic (congestive) heart failure: Secondary | ICD-10-CM | POA: Diagnosis not present

## 2021-08-07 DIAGNOSIS — R600 Localized edema: Secondary | ICD-10-CM

## 2021-08-07 DIAGNOSIS — R002 Palpitations: Secondary | ICD-10-CM | POA: Diagnosis not present

## 2021-08-07 DIAGNOSIS — I10 Essential (primary) hypertension: Secondary | ICD-10-CM

## 2021-08-07 MED ORDER — POTASSIUM CHLORIDE ER 10 MEQ PO TBCR: 10.0000 meq | EXTENDED_RELEASE_TABLET | Freq: Every day | ORAL | 3 refills | Status: DC

## 2021-08-07 NOTE — Progress Notes (Signed)
Cardiology Office Note:    Date:  08/07/2021   ID:  BYRD TERRERO, DOB 02-08-1946, MRN [id number]  PCP:  [name], MD   [facility] Providers Cardiologist:  Shelva Majestic, MD     Referring MD: [name], MD   Chief Complaint: swelling in lower extremities, increased SOB  History of Present Illness:    Sara [name] is a 75 y.o. female with a hx of HTN, hyperlipidemia, GERD, breast and renal CA, right mastectomy, and mild OSA. She was last seen in our office on 07/17/21 by Dr. Claiborne Billings for annual f/u. At that visit she reported mild SOB with walking and had 1+ bilateral ankle edema. She was advised to stop HCTZ and start furosemide 20 mg daily and echo was ordered and completed on 07/31/21. Echo revealed LVEF 55-60%, no LVH, trival MR, mild TR. On 08/04/21, she called the office with concerns for increased lower extremity swelling and increased SOB. She was scheduled for visit today.   Today, she presents with her husband. She reports that her lower extremity edema has not improved since she switched to furosemide. Says she is more SOB than when she was here on 10/27. She reports orthopnea, PND, and palpitations. She denies syncope or chest pain but her husband reports that she gets dizzy at times. Denies syncope. Home BP readings are usually 130/70 range. She is active during the day taking care of an 77 month old granddaughter but does not work out in addition to this. Her husband cooks most meals at home and they are conscientious about sodium intake.   Past Medical History:  Diagnosis Date   Allergic rhinitis due to pollen    Anxiety    Arthritis    "knees, back, right elbow; left shoulder; right ankle" (11/20/2014)   Breast cancer, right breast ([place]) 2016   "DCIS; zero stage" S/P mastectomy, cancer free since then   Cervical spondylosis without myelopathy    Cervicalgia    Chronic lower back pain    Constipation    takes Miralax daily   Degeneration of cervical  intervertebral disc    Depression    Diverticulosis of colon (without mention of hemorrhage)    GERD (gastroesophageal reflux disease)    takes Omeprazole daily   Graves' disease    "took a pill to correct"   H/O hiatal hernia    H/O urinary frequency    Headache(784.0)    denies migraines since the 80's but has occ and takes Butalbital prn   HTN (hypertension)    takes Metoprolol and Lisinopril daily   Insomnia    takes Ativan nigtly   Joint pain    "all of them"   Joint swelling    "knees, legs, ankles sometimes" (11/20/2014)   Morbid obesity ([place])    Numbness    LOWER LEGS   Other postablative hypothyroidism    takes SYnthroid daily   Palpitations    Peptic ulcer, unspecified site, unspecified as acute or chronic, without mention of hemorrhage, perforation, or obstruction    Primary localized osteoarthrosis, lower leg    Pure hypercholesterolemia    takes Pravastin daily   Recurrent UTI    Renal cell carcinoma ([place]) 01/08/2016   Right renal mass s/p partial right nephrectomy, cancer free since 2017   Shortness of breath    Sleep apnea    slight but doesn't require a CPAP (11/20/2014)   Tendonitis of knee    bilateral    Past Surgical History:  Procedure Laterality Date   ABDOMINAL HYSTERECTOMY     ANTERIOR LAT LUMBAR FUSION  05/13/2012   Procedure: ANTERIOR LATERAL LUMBAR FUSION 2 LEVELS;  Surgeon: Faythe Ghee, MD;  Location: [place] [facility];  Service: Neurosurgery;  Laterality: Left;  Left Lumbar Three-four,Lumbar four-five Extreme Lumbar Interbody Fusion with Percutaneous Pedicle Screws   APPENDECTOMY     BREAST BIOPSY Left ~ 2014   BREAST BIOPSY Right 2015   BREAST LUMPECTOMY Left 1988   BREAST RECONSTRUCTION WITH PLACEMENT OF TISSUE EXPANDER AND FLEX HD (ACELLULAR HYDRATED DERMIS) Right 11/20/2014   BREAST RECONSTRUCTION WITH PLACEMENT OF TISSUE EXPANDER AND FLEX HD (ACELLULAR HYDRATED DERMIS) Right 11/20/2014   Procedure: RIGHT BREAST RECONSTRUCTION WITH PLACEMENT OF  TISSUE EXPANDER AND ACELLULAR DERMA MATRIX (ACELLULAR DERMA MATRIX);  Surgeon: Crissie Reese, MD;  Location: [place];  Service: Plastics;  Laterality: Right;   CATARACT EXTRACTION W/ INTRAOCULAR LENS  IMPLANT, BILATERAL Bilateral    COLONOSCOPY  Sept 2008   RMR: normal rectum, left-sided diverticula, repeat in 2018   COLONOSCOPY WITH PROPOFOL N/A 03/21/2018   Dr. Gala Romney: diverticulosis, internal grade I hemorrhoids. no future colonoscopies unless develops new symptoms   DIAGNOSTIC LAPAROSCOPY     DILATION AND CURETTAGE OF UTERUS     ESOPHAGOGASTRODUODENOSCOPY     ESOPHAGOGASTRODUODENOSCOPY  2013   Dr. Gala Romney: Cervical esophageal web and Schatzki's ring s/p dilation, small hiatal hernia, antral and duodenal erosions likely NSAID effect, chronic duodenitis on path    ESOPHAGOGASTRODUODENOSCOPY (EGD) WITH PROPOFOL N/A 03/21/2018   Dr. Gala Romney: mild Schatzki ring s/p dilation   FOREIGN BODY REMOVAL Right 10/06/2013   Procedure: REMOVAL FOREIGN BODY EXTREMITY;  Surgeon: Carole Civil, MD;  [facility];  Service: Orthopedics;  Laterality: Right;   INJECTION KNEE Left 10/06/2013   Procedure: KNEE INJECTION;  Surgeon: Carole Civil, MD;  Location: [facility];  Service: Orthopedics;  Laterality: Left;   JOINT REPLACEMENT Bilateral    knees   KNEE ARTHROSCOPY Right    KNEE ARTHROSCOPY WITH LATERAL MENISECTOMY Right 10/06/2013   Procedure: KNEE ARTHROSCOPY WITH LATERAL AND MEDIAL MENISECTOMY;  Surgeon: Carole Civil, MD;  Location: [facility];  Service: Orthopedics;  Laterality: Right;  END @ 1234   lumpectomy on pelvis     "mass of nerves"   [place] N/A 03/21/2018   Procedure: MALONEY DILATION;  Surgeon: Daneil Dolin, MD;  [facility];  Service: Endoscopy;  Laterality: N/A;   MASTECTOMY COMPLETE / SIMPLE W/ SENTINEL NODE BIOPSY Right 11/20/2014   axillary   REMOVAL OF TISSUE EXPANDER AND PLACEMENT OF IMPLANT Right 12/13/2014   Procedure: REMOVAL OF RIGHT BREAST TISSUE EXPANDER;   Surgeon: Crissie Reese, MD;  Location: [facility];  Service: Plastics;  Laterality: Right;   ROBOTIC ASSITED PARTIAL NEPHRECTOMY Right 01/08/2016   Procedure: XI ROBOTIC ASSITED PARTIAL NEPHRECTOMY;  Surgeon: Alexis Frock, MD;  Location: [facility];  Service: Urology;  Laterality: Right;   SIMPLE MASTECTOMY WITH AXILLARY SENTINEL NODE BIOPSY Right 11/20/2014   Procedure: SIMPLE MASTECTOMY WITH AXILLARY SENTINEL NODE BIOPSY;  Surgeon: Erroll Luna, MD;  Location: [place];  Service: General;  Laterality: Right;   TISSUE EXPANDER REMOVAL Right 12/13/2014   dr Harlow Mares   TONSILLECTOMY     TOTAL KNEE ARTHROPLASTY Right 01/24/2014   Procedure: TOTAL KNEE ARTHROPLASTY;  Surgeon: Carole Civil, MD;  [facility];  Service: Orthopedics;  Laterality: Right;   TOTAL KNEE ARTHROPLASTY Left 08/08/2014   Procedure: TOTAL KNEE ARTHROPLASTY;  Surgeon: Tim Lair  Aline Brochure, MD;  Location: [facility];  Service: Orthopedics;  Laterality: Left;   TUBAL LIGATION     UPPER GASTROINTESTINAL ENDOSCOPY      Current Medications: Current Meds  Medication Sig   acetaminophen (TYLENOL) 500 MG tablet Take 650 mg by mouth in the morning and at bedtime.   aspirin EC 81 MG tablet Take 81 mg by mouth daily. Swallow whole.   atorvastatin (LIPITOR) 20 MG tablet Take 1 tablet (20 mg total) by mouth daily.   carvedilol (COREG) 3.125 MG tablet TAKE 1 TABLET BY MOUTH  TWICE DAILY WITH MEALS   diphenhydrAMINE (BENADRYL) 50 MG capsule Take 50 mg by mouth at bedtime.   ergocalciferol (VITAMIN D2) 1.25 MG (50000 UT) capsule Take 1 capsule (50,000 Units total) by mouth once a week.   furosemide (LASIX) 20 MG tablet Take 1 tablet (20 mg total) by mouth daily.   gabapentin (NEURONTIN) 600 MG tablet TAKE 1 TABLET BY MOUTH  TWICE DAILY   levothyroxine (SYNTHROID, LEVOTHROID) 88 MCG tablet Take 88 mcg by mouth daily before breakfast.    omeprazole (PRILOSEC) 20 MG capsule Take 20 mg by mouth daily. May take a second 20 mg dose as needed for heartburn    oxybutynin (DITROPAN) 5 MG tablet Take 5 mg by mouth 2 (two) times daily.    predniSONE (DELTASONE) 10 MG tablet TAKE 3 TABLETS (30 MG) BY  MOUTH DAILY AS NEEDED   sertraline (ZOLOFT) 50 MG tablet Take 1 tablet (50 mg total) by mouth daily.     Allergies:   Propoxyphene hcl, Pollen extract, Camphor, Dust mite extract, Latex, Molds & smuts, and Tomato   Social History   Socioeconomic History   Marital status: Married    Spouse name: Milbert Coulter   Number of children: 4   Years of education: Not on file   Highest education level: Not on file  Occupational History   Occupation: disability    Employer: UNEMPLOYED    Comment: since 2008, knee surgery  Tobacco Use   Smoking status: Former    Packs/day: 1.00    Years: 27.00    Pack years: 27.00    Types: Cigarettes    Quit date: 12/26/1985    Years since quitting: 35.6   Smokeless tobacco: Never   Tobacco comments:    Quit in 1987  Substance and Sexual Activity   Alcohol use: Yes    Alcohol/week: 0.0 standard drinks    Comment: occasional wine   Drug use: No   Sexual activity: Yes    Birth control/protection: Surgical  Other Topics Concern   Not on file  Social History Narrative   Currently married for 10 years. Husband's name is Milbert Coulter. She is an excellent singer. 8 children between she and her husband. 23 grand children. 12 great grandchildren.   Social Determinants of Health   Financial Resource Strain: Not on file  Food Insecurity: Not on file  Transportation Needs: Not on file  Physical Activity: Not on file  Stress: Not on file  Social Connections: Not on file     Family History: The patient's family history includes Heart attack in her father, sister, sister, and sister; Lung cancer in her maternal grandmother; Obesity in her brother; Osteoarthritis in her brother and sister; Stroke in her sister. There is no history of Colon cancer.  ROS:   Please see the history of present illness.  ++orthopnea, PND, palpitations. All  other systems reviewed and are negative.  Labs/Other Studies Reviewed:  The following studies were reviewed today:  Echo 11/22    1. Left ventricular ejection fraction, by estimation, is 55 to 60%. Left  ventricular ejection fraction by 3D volume is 57 %. The left ventricle has  normal function. The left ventricle has no regional wall motion  abnormalities. Left ventricular diastolic   parameters were normal.   2. Right ventricular systolic function is normal. The right ventricular  size is normal. There is normal pulmonary artery systolic pressure. The  estimated right ventricular systolic pressure is 55.7 mmHg.   3. The pericardial effusion is posterior to the left ventricle and  localized near the right atrium.   4. The mitral valve is normal in structure. Trivial mitral valve  regurgitation. No evidence of mitral stenosis.   5. The aortic valve is tricuspid. Aortic valve regurgitation is not  visualized. Mild aortic valve sclerosis is present, with no evidence of  aortic valve stenosis.   6. The inferior vena cava is normal in size with greater than 50%  respiratory variability, suggesting right atrial pressure of 3 mmHg.  Recent Labs: 10/14/2020: ALT 29; BUN 12; Creatinine, Ser 0.72; Hemoglobin 13.8; Platelets 282; Potassium 3.9; Sodium 134   Recent Lipid Panel    Component Value Date/Time   CHOL 141 06/07/2020 0833   TRIG 71 06/07/2020 0833   HDL 57 06/07/2020 0833   CHOLHDL 2.5 06/07/2020 0833   VLDL 14 03/06/2020 0420   LDLCALC 69 06/07/2020 0833     Physical Exam:    VS:  BP 128/76   Pulse 61   Ht $R'5\' 6"'Qz$  (1.676 m)   Wt 236 lb 12.8 oz (107.4 kg)   SpO2 96%   BMI 38.22 kg/m     Wt Readings from Last 3 Encounters:  08/07/21 236 lb 12.8 oz (107.4 kg)  07/17/21 224 lb 12.8 oz (102 kg)  03/27/21 213 lb (96.6 kg)     GEN: Well nourished, well developed in no acute distress HEENT: Normal NECK: No JVD; No carotid bruits LYMPHATICS: No  lymphadenopathy CARDIAC: RRR, no murmurs, rubs, gallops RESPIRATORY:  Clear to auscultation without rales, wheezing or rhonchi  ABDOMEN: Soft, obese, non-tender, non-distended MUSCULOSKELETAL:   No deformity. Bilateral 2+ pitting edema  SKIN: Warm and dry NEUROLOGIC:  Alert and oriented x 3 PSYCHIATRIC:  Normal affect   EKG:  EKG is  ordered today.  The ekg ordered today demonstrates SR at 61 bpm, low voltage,  no ST/T wave abnormalities  Diagnoses:    1. Bilateral leg edema   2. Palpitations   3. Essential hypertension   4. Acute on chronic diastolic heart failure (HCC)    Assessment and Plan:    Bilateral leg edema: Her leg edema that has not improved with Lasix x 2 weeks. She is not using compression stockings. Encouraged compression stockings daily while awake and prescription given per her request. Encouraged leg elevation when sedentary. Last BMET on file from 07/14/21, will update at [facility] tomorrow. Unable to get labs at our office today due to time of day and lab is closed. Asked her to call back Monday to let us know if swelling has improved.  Acute on Chronic HFpEF: LVEF 55-60%, G1DD on echo 08/01/21. She is having more leg edema and SOB since we last saw her on 07/17/21. Encouraged continued good BP control, low sodium diet, and weight loss. Will double furosemide and potassium for 3 days and have her call back Monday to report status. Advised daily weight and to call  to report if weight increases > 2 lbs in 24 hours or 5 lbs in 1 week. Will have her go to [facility] for BMET tomorrow due to timing of visit and our lab technician no longer here. Will determine further medication dose and further lab work based on results of BMET and report from patient in 4 days. Return for office visit in 1 month.   Essential hypertension: BP stable today and recent BP readings at home. Continue daily BP monitoring. Continue carvedilol, diuretic.   Palpitations: She has daily palpitations but she  does not monitor the frequency or duration. She also has dizziness. No previous history of arrhythmia. Will place a 14 day monitor for evaluation.      Medication Adjustments/Labs and Tests Ordered: Current medicines are reviewed at length with the patient today.  Concerns regarding medicines are outlined above.  Orders Placed This Encounter  Procedures   Basic metabolic panel   LONG TERM MONITOR (3-14 DAYS)   EKG 12-Lead   Meds ordered this encounter  Medications   potassium chloride (KLOR-CON) 10 MEQ tablet    Sig: Take 1 tablet (10 mEq total) by mouth daily.    Dispense:  90 tablet    Refill:  3    Patient Instructions  Medication Instructions:  Take 20 mg  Potassium and 40 mg  Lasix for (3) Three Days. Then resume Daily Dose. *If you need a refill on your cardiac medications before your next appointment, please call your pharmacy*   Lab Work: BMET : To Be Done November 18th. If you have labs (blood work) drawn today and your tests are completely normal, you will receive your results only by: [place] (if you have MyChart) OR A paper copy in the mail If you have any lab test that is abnormal or we need to change your treatment, we will call you to review the results.   Testing/Procedures: ZIO AT Long term monitor-Live Telemetry  Your physician has requested you wear a ZIO patch monitor for 14 days.  This is a single patch monitor. [employer] supplies one patch monitor per enrollment. Additional  stickers are not available.  Please do not apply patch if you will be having a Nuclear Stress Test, Echocardiogram, Cardiac CT, MRI,  or Chest Xray during the period you would be wearing the monitor. The patch cannot be worn during  these tests. You cannot remove and re-apply the ZIO AT patch monitor.  Your ZIO patch monitor will be mailed 3 day USPS to your address on file. It may take 3-5 days to  receive your monitor after you have been enrolled.  Once you have received  your monitor, please review the enclosed instructions. Your monitor has  already been registered assigning a specific monitor serial # to you.   Billing and Patient Assistance Program information  Theodore Demark has been supplied with any insurance information on record for billing. [employer] offers a sliding scale Patient Assistance Program for patients without insurance, or whose  insurance does not completely cover the cost of the ZIO patch monitor. You must apply for the  Patient Assistance Program to qualify for the discounted rate. To apply, call [employer] at [phone],  select option 4, select option 2 , ask to apply for the Patient Assistance Program, (you can request an  interpreter if needed). [employer] will ask your household income and how many people are in your  household. [employer] will quote your out-of-pocket cost based on this information. They will also be  able  to set up a 12 month interest free payment plan if needed.  Applying the monitor   Shave hair from upper left chest.  Hold the abrader disc by orange tab. Rub the abrader in 40 strokes over left upper chest as indicated in  your monitor instructions.  Clean area with 4 enclosed alcohol pads. Use all pads to ensure the area is cleaned thoroughly. Let  dry.  Apply patch as indicated in monitor instructions. Patch will be placed under collarbone on left side of  chest with arrow pointing upward.  Rub patch adhesive wings for 2 minutes. Remove the white label marked "1". Remove the white label  marked "2". Rub patch adhesive wings for 2 additional minutes.  While looking in a mirror, press and release button in center of patch. A small green light will flash 3-4  times. This will be your only indicator that the monitor has been turned on.  Do not shower for the first 24 hours. You may shower after the first 24 hours.  Press the button if you feel a symptom. You will hear a small click. Record Date, Time and Symptom in  the  Patient Log.   Starting the Gateway  In your kit there is a Audiological scientist box the size of a cellphone. This is Buyer, retail. It transmits all your  recorded data to [facility]. This box must always stay within 10 feet of you. Open the box and push the *  button. There will be a light that blinks orange and then green a few times. When the light stops  blinking, the Gateway is connected to the ZIO patch. Call [employer] at [phone] to confirm your monitor is transmitting.  Returning your monitor  Remove your patch and place it inside the Gateway. In the lower half of the Gateway there is a white  bag with prepaid postage on it. Place Gateway in bag and seal. Mail package back to [place] as soon as  possible. Your physician should have your final report approximately 7 days after you have mailed back  your monitor. Call [facility] Customer Care at [phone] if you have questions regarding your ZIO AT  patch monitor. Call them immediately if you see an orange light blinking on your monitor.  If your monitor falls off in less than 4 days, contact our Monitor department at [phone]. If your  monitor becomes loose or falls off after 4 days call [employer] at [phone] for suggestions on  securing your monitor    Follow-Up: At [facility], you and your health needs are our priority.  As part of our continuing mission to provide you with exceptional heart care, we have created designated Provider Care Teams.  These Care Teams include your primary Cardiologist (physician) and Advanced Practice Providers (APPs -  Physician Assistants and Nurse Practitioners) who all work together to provide you with the care you need, when you need it.  We recommend signing up for the patient portal called "MyChart".  Sign up information is provided on this After Visit Summary.  MyChart is used to connect with patients for Virtual Visits (Telemedicine).  Patients are able to view lab/test  results, encounter notes, upcoming appointments, etc.  Non-urgent messages can be sent to your provider as well.   To learn more about what you can do with MyChart, go to [email or web address].    Your next appointment:   4 week(s)  The format for your next appointment:   In Person  Provider:   Caron Presume, PA-C    Then, Shelva Majestic, MD will plan to see you again in 4 week(s).    Other Instructions  Heart Failure Education:  Weigh yourself EVERY morning after you go to the bathroom but before you eat or drink anything. Write this number down in a weight log/diary. If you gain 3 pounds overnight or 5 pounds in a week, call the office. Take your medicines as prescribed. If you have concerns about your medications, please call us before you stop taking them. Eat low salt foods--Limit salt (sodium) to 2000 mg per day. This will help prevent your body from holding onto fluid. Read food labels as many processed foods have a lot of sodium, especially canned goods and prepackaged meats. If you would like some assistance choosing low sodium foods, we would be happy to set you up with a nutritionist. Limit all fluids for the day to less than 2 liters (64 ounces). Fluid includes all drinks, coffee, juice, ice chips, soup, jello, and all other liquids. Stay as active as you can everyday. Staying active will give you more energy and make your muscles stronger. Start with 5 minutes at a time and work your way up to 30 minutes a day. Break up your activities--do some in the morning and some in the afternoon. Start with 3 days per week and work your way up to 5 days as you can.  If you have chest pain, feel short of breath, dizzy, or lightheaded, STOP. If you don't feel better after a short rest, call 911. If you do feel better, call the office to let us know you have symptoms with exercise.     Signed, Emmaline Life, NP  08/07/2021 7:44 PM    [facility]

## 2021-08-07 NOTE — Progress Notes (Unsigned)
Enrolled patient for a 14 day Zio XT monitor to be mailed to patients home  Dr Kelly to read 

## 2021-08-07 NOTE — Patient Instructions (Signed)
Medication Instructions:  Take 20 mg  Potassium and 40 mg  Lasix for (3) Three Days. Then resume Daily Dose. *If you need a refill on your cardiac medications before your next appointment, please call your pharmacy*   Lab Work: BMET : To Be Done November 18th. If you have labs (blood work) drawn today and your tests are completely normal, you will receive your results only by: [facility] (if you have MyChart) OR A paper copy in the mail If you have any lab test that is abnormal or we need to change your treatment, we will call you to review the results.   Testing/Procedures: ZIO AT Long term monitor-Live Telemetry  Your physician has requested you wear a ZIO patch monitor for 14 days.  This is a single patch monitor. [employer] supplies one patch monitor per enrollment. Additional  stickers are not available.  Please do not apply patch if you will be having a Nuclear Stress Test, Echocardiogram, Cardiac CT, MRI,  or Chest Xray during the period you would be wearing the monitor. The patch cannot be worn during  these tests. You cannot remove and re-apply the ZIO AT patch monitor.  Your ZIO patch monitor will be mailed 3 day USPS to your address on file. It may take 3-5 days to  receive your monitor after you have been enrolled.  Once you have received your monitor, please review the enclosed instructions. Your monitor has  already been registered assigning a specific monitor serial # to you.   Billing and Patient Assistance Program information  Sara [name] has been supplied with any insurance information on record for billing. [employer] offers a sliding scale Patient Assistance Program for patients without insurance, or whose  insurance does not completely cover the cost of the ZIO patch monitor. You must apply for the  Patient Assistance Program to qualify for the discounted rate. To apply, call [employer] at [phone],  select option 4, select option 2 , ask to apply for the Patient  Assistance Program, (you can request an  interpreter if needed). [employer] will ask your household income and how many people are in your  household. [employer] will quote your out-of-pocket cost based on this information. They will also be able  to set up a 12 month interest free payment plan if needed.  Applying the monitor   Shave hair from upper left chest.  Hold the abrader disc by orange tab. Rub the abrader in 40 strokes over left upper chest as indicated in  your monitor instructions.  Clean area with 4 enclosed alcohol pads. Use all pads to ensure the area is cleaned thoroughly. Let  dry.  Apply patch as indicated in monitor instructions. Patch will be placed under collarbone on left side of  chest with arrow pointing upward.  Rub patch adhesive wings for 2 minutes. Remove the white label marked "1". Remove the white label  marked "2". Rub patch adhesive wings for 2 additional minutes.  While looking in a mirror, press and release button in center of patch. A small green light will flash 3-4  times. This will be your only indicator that the monitor has been turned on.  Do not shower for the first 24 hours. You may shower after the first 24 hours.  Press the button if you feel a symptom. You will hear a small click. Record Date, Time and Symptom in  the Patient Log.   Starting the Gateway  In your kit there is a Optometrist  the size of a cellphone. This is Airline pilot. It transmits all your  recorded data to [facility]. This box must always stay within 10 feet of you. Open the box and push the *  button. There will be a light that blinks orange and then green a few times. When the light stops  blinking, the Gateway is connected to the ZIO patch. Call [employer] at [phone] to confirm your monitor is transmitting.  Returning your monitor  Remove your patch and place it inside the [place]. In the lower half of the Gateway there is a white  bag with prepaid postage on it. Place  Gateway in bag and seal. Mail package back to [place] as soon as  possible. Your physician should have your final report approximately 7 days after you have mailed back  your monitor. Call [facility] at [phone] if you have questions regarding your ZIO AT  patch monitor. Call them immediately if you see an orange light blinking on your monitor.  If your monitor falls off in less than 4 days, contact our Monitor department at [phone]. If your  monitor becomes loose or falls off after 4 days call [employer] at [phone] for suggestions on  securing your monitor    Follow-Up: At [facility], you and your health needs are our priority.  As part of our continuing mission to provide you with exceptional heart care, we have created designated Provider Care Teams.  These Care Teams include your primary Cardiologist (physician) and Advanced Practice Providers (APPs -  Physician Assistants and Nurse Practitioners) who all work together to provide you with the care you need, when you need it.  We recommend signing up for the patient portal called "MyChart".  Sign up information is provided on this After Visit Summary.  MyChart is used to connect with patients for Virtual Visits (Telemedicine).  Patients are able to view lab/test results, encounter notes, upcoming appointments, etc.  Non-urgent messages can be sent to your provider as well.   To learn more about what you can do with MyChart, go to [email or web address].    Your next appointment:   4 week(s)  The format for your next appointment:   In Person  Provider:   Caron Presume, PA-C    Then, Shelva Majestic, MD will plan to see you again in 4 week(s).    Other Instructions  Heart Failure Education:  Weigh yourself EVERY morning after you go to the bathroom but before you eat or drink anything. Write this number down in a weight log/diary. If you gain 3 pounds overnight or 5 pounds in a week, call  the office. Take your medicines as prescribed. If you have concerns about your medications, please call us before you stop taking them. Eat low salt foods--Limit salt (sodium) to 2000 mg per day. This will help prevent your body from holding onto fluid. Read food labels as many processed foods have a lot of sodium, especially canned goods and prepackaged meats. If you would like some assistance choosing low sodium foods, we would be happy to set you up with a nutritionist. Limit all fluids for the day to less than 2 liters (64 ounces). Fluid includes all drinks, coffee, juice, ice chips, soup, jello, and all other liquids. Stay as active as you can everyday. Staying active will give you more energy and make your muscles stronger. Start with 5 minutes at a time and work your way up to 30 minutes a day. Break up your  activities--do some in the morning and some in the afternoon. Start with 3 days per week and work your way up to 5 days as you can.  If you have chest pain, feel short of breath, dizzy, or lightheaded, STOP. If you don't feel better after a short rest, call 911. If you do feel better, call the office to let us know you have symptoms with exercise.

## 2021-08-08 DIAGNOSIS — R002 Palpitations: Secondary | ICD-10-CM | POA: Diagnosis not present

## 2021-08-08 DIAGNOSIS — R600 Localized edema: Secondary | ICD-10-CM | POA: Diagnosis not present

## 2021-08-09 LAB — BASIC METABOLIC PANEL
BUN/Creatinine Ratio: 12 (ref 12–28)
BUN: 10 mg/dL (ref 8–27)
CO2: 26 mmol/L (ref 20–29)
Calcium: 8.4 mg/dL — ABNORMAL LOW (ref 8.7–10.3)
Chloride: 101 mmol/L (ref 96–106)
Creatinine, Ser: 0.86 mg/dL (ref 0.57–1.00)
Glucose: 97 mg/dL (ref 70–99)
Potassium: 4.1 mmol/L (ref 3.5–5.2)
Sodium: 140 mmol/L (ref 134–144)
eGFR: 71 mL/min/{1.73_m2} (ref >59)

## 2021-08-11 ENCOUNTER — Telehealth: Payer: Self-pay | Admitting: Nurse Practitioner

## 2021-08-11 DIAGNOSIS — R600 Localized edema: Secondary | ICD-10-CM

## 2021-08-11 DIAGNOSIS — R002 Palpitations: Secondary | ICD-10-CM | POA: Diagnosis not present

## 2021-08-11 NOTE — Telephone Encounter (Signed)
Patient calling with BP and weight readings:   11/18: BP 140/84, wt 233.6 lbs 11/19: BP 113/77, wt 232.4 lbs 11/20: BP 135/83, wt 231.2 lbs 11/21: BP 128/79, wt 229 lbs

## 2021-08-11 NOTE — Telephone Encounter (Signed)
Spoke to patient . Keep monitoring blood pressure and weight. Weight did go down and patient states she is back at regular dose.  RN informed patient will forward to [facility] for further instruction.   Patient wanted to know if she should continue with monitor when it arrives and should she continue weighing and taking blood pressure. RN informed her yes to all.  Patient verbalized understanding

## 2021-08-11 NOTE — Telephone Encounter (Signed)
The weights and BP look good. Continue treatment plan. Results and advice sent through [facility].

## 2021-08-20 DIAGNOSIS — E039 Hypothyroidism, unspecified: Secondary | ICD-10-CM | POA: Diagnosis not present

## 2021-08-20 DIAGNOSIS — E785 Hyperlipidemia, unspecified: Secondary | ICD-10-CM | POA: Diagnosis not present

## 2021-08-20 DIAGNOSIS — I10 Essential (primary) hypertension: Secondary | ICD-10-CM | POA: Diagnosis not present

## 2021-08-22 ENCOUNTER — Other Ambulatory Visit: Payer: Self-pay | Admitting: Orthopedic Surgery

## 2021-08-25 ENCOUNTER — Other Ambulatory Visit (HOSPITAL_COMMUNITY): Payer: Self-pay | Admitting: Hematology and Oncology

## 2021-08-25 DIAGNOSIS — F32A Depression, unspecified: Secondary | ICD-10-CM

## 2021-09-01 DIAGNOSIS — R600 Localized edema: Secondary | ICD-10-CM | POA: Diagnosis not present

## 2021-09-01 DIAGNOSIS — R002 Palpitations: Secondary | ICD-10-CM | POA: Diagnosis not present

## 2021-09-12 ENCOUNTER — Encounter: Payer: Self-pay | Admitting: Cardiovascular Disease

## 2021-09-12 ENCOUNTER — Ambulatory Visit: Payer: Medicare Other | Admitting: Cardiovascular Disease

## 2021-09-12 ENCOUNTER — Other Ambulatory Visit: Payer: Self-pay

## 2021-09-12 VITALS — BP 130/62 | HR 73 | Ht 66.0 in | Wt 230.4 lb

## 2021-09-12 DIAGNOSIS — E785 Hyperlipidemia, unspecified: Secondary | ICD-10-CM

## 2021-09-12 DIAGNOSIS — R0602 Shortness of breath: Secondary | ICD-10-CM | POA: Diagnosis not present

## 2021-09-12 DIAGNOSIS — I2584 Coronary atherosclerosis due to calcified coronary lesion: Secondary | ICD-10-CM | POA: Diagnosis not present

## 2021-09-12 DIAGNOSIS — I471 Supraventricular tachycardia, unspecified: Secondary | ICD-10-CM

## 2021-09-12 DIAGNOSIS — I10 Essential (primary) hypertension: Secondary | ICD-10-CM | POA: Diagnosis not present

## 2021-09-12 DIAGNOSIS — R600 Localized edema: Secondary | ICD-10-CM

## 2021-09-12 DIAGNOSIS — R002 Palpitations: Secondary | ICD-10-CM

## 2021-09-12 DIAGNOSIS — I2510 Atherosclerotic heart disease of native coronary artery without angina pectoris: Secondary | ICD-10-CM

## 2021-09-12 MED ORDER — FUROSEMIDE 40 MG PO TABS: 40.0000 mg | ORAL_TABLET | Freq: Every day | ORAL | 3 refills | Status: DC

## 2021-09-12 MED ORDER — METOPROLOL TARTRATE 25 MG PO TABS: 25.0000 mg | ORAL_TABLET | Freq: Two times a day (BID) | ORAL | 3 refills | Status: DC

## 2021-09-12 NOTE — Progress Notes (Signed)
Cardiology Office Note    Date:  09/15/2021   ID:  Sara [name], Sara [name] [birth year], MRN [id number]  PCP:  [name], MD  Cardiologist:  Shelva Majestic, MD   2 month  follow-up cardiology office visit   History of Present Illness:  Sara [name] is a 75 y.o. female who had remotely seen Dr. Johnsie Cancel in 2014.  I saw her for initial evaluation with me on June 14, 2020 and last saw her on July 17, 2021..  She presents for a  follow-up evaluation   Sara [name] has a history of hypertension, hyperlipidemia, GERD, hypothyroidism, breast and renal cell CA and is status post right mastectomy.  She has a history of mild obstructive sleep apnea not on therapy and has experienced episodes of atypical chest pain.  She was admitted to the hospital in June 2021 with constant left lateral upper pectoral infra chest pain inferior to her shoulder.  Her pain was constant and at times waxed and waned in intensity.  It was not exertionally precipitated.  She presented to the emergency room.  Her EKG was negative for ischemic change.  Troponins were negative.  Laboratory was notable for hypokalemia with potassium of 2.9.  Her ECG shows sinus rhythm with left axis deviation without ectopy or ST-T changes.  D-dimer was upper normal.  It was felt that her chest pain was noncardiac in etiology.  I had seen her for consultation in the hospital and recommended initial noninvasive assessment with coronary CTA I recommended discontinuance of hydrochlorothiazide.  An echo Doppler study on March 05, 2020 showed an EF of 65% without wall motion abnormalities.  There was grade 1 diastolic dysfunction.  There were normal pulmonary pressures.  There was no significant valvular abnormality.  A coronary CTA showed a calcium score at 73, representing 74th percentile for age and sex matched control.  There was mild coronary plaque in the LAD of 0 to 24%.  There was heavy calcification in the RCA creating a potential blooming  artifact.  FFR analysis was normal.  Sara [name] subsequently saw a Rosaria Ferries in the office for follow-up evaluation.  I saw her for my initial evaluation in the office with me on June 14, 2020.  At that time, she was experiencing occasional  left-sided chest wall tenderness.  She denied any exertional chest pain.  She developed Covid in June/July 2020.  Subsequently she did not get vaccinated.  At times she had mild swelling in her legs and has been wearing support stockings.  She sees Dr. [name] for primary care.  During that evaluation, she had costochondral tenderness which mimicked her chest pain consistent with musculoskeletal etiology.  I last saw her in July 17, 2021.  She has continued to be evaluated by Dr. Delton Coombes at [facility] oncology center.  She denies any exertional chest pain.  At times she notes a rare sensation of a pulling sensation across her chest which is nonexertional.  She does experience some mild shortness of breath with walking.  She has noted some mild ankle swelling.  She is on a regimen consisting of carvedilol 3.125 mg twice a day, and HCTZ 25 mg daily.  She is no longer on amlodipine for hypertension.  She is on levothyroxine 88 mcg for hypothyroidism, takes omeprazole for GERD, and sertraline.  During that evaluation, with her exertional dyspnea I recommended she undergo an echo Doppler study.  Due to her ankle swelling HCTZ was discontinued and she was started  on furosemide 20 mg daily.  Lipid studies in September 2021 showed an LDL cholesterol of 69 on atorvastatin 20 mg.  She represented to our office on August 07, 2021 and was seen by Christen Bame, NP.  Her lower extremity edema had not improved since switching to furosemide and also admitted to experiencing orthopnea, PND and palpitations without syncope.  She was avoiding sodium intake.  Compression stockings were recommended.  She was instructed to double her furosemide dose for 3 days with  supplemental potassium.  Because of palpitations a 14-day monitor was recommended.  She wore a 14-day monitor which showed predominant sinus rhythm with an average heart rate of 73 bpm.  The slowest heart rate was sinus bradycardia 53 bpm the fastest heart rate was 174 bpm which was a 4 beat episode of SVT.  She had 1 run of nonsustained ventricular tachycardia lasting 4 beats with a maximum rate at 156 bpm (average 134) and there were a total of 17 short episodes of SVT with the longest interval being 20 beats at an average rate at 128 bpm.  There were isolated PACs and atrial couplets and rare isolated PVC.  Presently, she admits to still experiencing palpitations.  She continues to have lower extremity edema.  She has been taking the furosemide 20 mg daily after having taken 3 days of the higher dose.  She continues to be on carvedilol 3.125 mg twice a day, levothyroxine 88 mcg and is on atorvastatin 20 mg for hyperlipidemia.  She presents for evaluation.    Past Medical History:  Diagnosis Date   Allergic rhinitis due to pollen    Anxiety    Arthritis    "knees, back, right elbow; left shoulder; right ankle" (11/20/2014)   Breast cancer, right breast ([place]) 2016   "DCIS; zero stage" S/P mastectomy, cancer free since then   Cervical spondylosis without myelopathy    Cervicalgia    Chronic lower back pain    Constipation    takes Miralax daily   Degeneration of cervical intervertebral disc    Depression    Diverticulosis of colon (without mention of hemorrhage)    GERD (gastroesophageal reflux disease)    takes Omeprazole daily   Graves' disease    "took a pill to correct"   H/O hiatal hernia    H/O urinary frequency    Headache(784.0)    denies migraines since the 80's but has occ and takes Butalbital prn   HTN (hypertension)    takes Metoprolol and Lisinopril daily   Insomnia    takes Ativan nigtly   Joint pain    "all of them"   Joint swelling    "knees, legs, ankles  sometimes" (11/20/2014)   Morbid obesity ([place])    Numbness    LOWER LEGS   Other postablative hypothyroidism    takes SYnthroid daily   Palpitations    Peptic ulcer, unspecified site, unspecified as acute or chronic, without mention of hemorrhage, perforation, or obstruction    Primary localized osteoarthrosis, lower leg    Pure hypercholesterolemia    takes Pravastin daily   Recurrent UTI    Renal cell carcinoma ([place]) 01/08/2016   Right renal mass s/p partial right nephrectomy, cancer free since 2017   Shortness of breath    Sleep apnea    slight but doesn't require a CPAP (11/20/2014)   Tendonitis of knee    bilateral    Past Surgical History:  Procedure Laterality Date   ABDOMINAL HYSTERECTOMY  ANTERIOR LAT LUMBAR FUSION  05/13/2012   Procedure: ANTERIOR LATERAL LUMBAR FUSION 2 LEVELS;  Surgeon: Faythe Ghee, MD;  Location: [facility];  Service: Neurosurgery;  Laterality: Left;  Left Lumbar Three-four,Lumbar four-five Extreme Lumbar Interbody Fusion with Percutaneous Pedicle Screws   APPENDECTOMY     BREAST BIOPSY Left ~ 2014   BREAST BIOPSY Right 2015   BREAST LUMPECTOMY Left 1988   BREAST RECONSTRUCTION WITH PLACEMENT OF TISSUE EXPANDER AND FLEX HD (ACELLULAR HYDRATED DERMIS) Right 11/20/2014   BREAST RECONSTRUCTION WITH PLACEMENT OF TISSUE EXPANDER AND FLEX HD (ACELLULAR HYDRATED DERMIS) Right 11/20/2014   Procedure: RIGHT BREAST RECONSTRUCTION WITH PLACEMENT OF TISSUE EXPANDER AND ACELLULAR DERMA MATRIX (ACELLULAR DERMA MATRIX);  Surgeon: Crissie Reese, MD;  Location: [facility];  Service: Plastics;  Laterality: Right;   CATARACT EXTRACTION W/ INTRAOCULAR LENS  IMPLANT, BILATERAL Bilateral    COLONOSCOPY  Sept 2008   RMR: normal rectum, left-sided diverticula, repeat in 2018   COLONOSCOPY WITH PROPOFOL N/A 03/21/2018   Dr. Gala Romney: diverticulosis, internal grade I hemorrhoids. no future colonoscopies unless develops new symptoms   DIAGNOSTIC LAPAROSCOPY     DILATION AND CURETTAGE  OF UTERUS     ESOPHAGOGASTRODUODENOSCOPY     ESOPHAGOGASTRODUODENOSCOPY  2013   Dr. Gala Romney: Cervical esophageal web and Schatzki's ring s/p dilation, small hiatal hernia, antral and duodenal erosions likely NSAID effect, chronic duodenitis on path    ESOPHAGOGASTRODUODENOSCOPY (EGD) WITH PROPOFOL N/A 03/21/2018   Dr. Gala Romney: mild Schatzki ring s/p dilation   FOREIGN BODY REMOVAL Right 10/06/2013   Procedure: REMOVAL FOREIGN BODY EXTREMITY;  Surgeon: Carole Civil, MD;  Location: [facility];  Service: Orthopedics;  Laterality: Right;   INJECTION KNEE Left 10/06/2013   Procedure: KNEE INJECTION;  Surgeon: Carole Civil, MD;  Location: [facility];  Service: Orthopedics;  Laterality: Left;   JOINT REPLACEMENT Bilateral    knees   KNEE ARTHROSCOPY Right    KNEE ARTHROSCOPY WITH LATERAL MENISECTOMY Right 10/06/2013   Procedure: KNEE ARTHROSCOPY WITH LATERAL AND MEDIAL MENISECTOMY;  Surgeon: Carole Civil, MD;  Location: [facility];  Service: Orthopedics;  Laterality: Right;  END @ 1234   lumpectomy on pelvis     "mass of nerves"   [place] N/A 03/21/2018   Procedure: MALONEY DILATION;  Surgeon: Daneil Dolin, MD;  [facility];  Service: Endoscopy;  Laterality: N/A;   MASTECTOMY COMPLETE / SIMPLE W/ SENTINEL NODE BIOPSY Right 11/20/2014   axillary   REMOVAL OF TISSUE EXPANDER AND PLACEMENT OF IMPLANT Right 12/13/2014   Procedure: REMOVAL OF RIGHT BREAST TISSUE EXPANDER;  Surgeon: Crissie Reese, MD;  Location: [facility];  Service: Plastics;  Laterality: Right;   ROBOTIC ASSITED PARTIAL NEPHRECTOMY Right 01/08/2016   Procedure: XI ROBOTIC ASSITED PARTIAL NEPHRECTOMY;  Surgeon: Alexis Frock, MD;  Location: [facility];  Service: Urology;  Laterality: Right;   SIMPLE MASTECTOMY WITH AXILLARY SENTINEL NODE BIOPSY Right 11/20/2014   Procedure: SIMPLE MASTECTOMY WITH AXILLARY SENTINEL NODE BIOPSY;  Surgeon: Erroll Luna, MD;  Location: [facility];  Service: General;  Laterality: Right;   TISSUE EXPANDER  REMOVAL Right 12/13/2014   dr Harlow Mares   TONSILLECTOMY     TOTAL KNEE ARTHROPLASTY Right 01/24/2014   Procedure: TOTAL KNEE ARTHROPLASTY;  Surgeon: Carole Civil, MD;  Location: [facility];  Service: Orthopedics;  Laterality: Right;   TOTAL KNEE ARTHROPLASTY Left 08/08/2014   Procedure: TOTAL KNEE ARTHROPLASTY;  Surgeon: Carole Civil, MD;  Location: [facility];  Service: Orthopedics;  Laterality:  Left;   TUBAL LIGATION     UPPER GASTROINTESTINAL ENDOSCOPY      Current Medications: Outpatient Medications Prior to Visit  Medication Sig Dispense Refill   acetaminophen (TYLENOL) 500 MG tablet Take 650 mg by mouth in the morning and at bedtime.     aspirin EC 81 MG tablet Take 81 mg by mouth daily. Swallow whole.     atorvastatin (LIPITOR) 20 MG tablet Take 1 tablet (20 mg total) by mouth daily. 30 tablet 11   diphenhydrAMINE (BENADRYL) 50 MG capsule Take 50 mg by mouth at bedtime.     ergocalciferol (VITAMIN D2) 1.25 MG (50000 UT) capsule Take 1 capsule (50,000 Units total) by mouth once a week. 16 capsule 3   gabapentin (NEURONTIN) 600 MG tablet TAKE 1 TABLET BY MOUTH  TWICE DAILY 180 tablet 3   levothyroxine (SYNTHROID, LEVOTHROID) 88 MCG tablet Take 88 mcg by mouth daily before breakfast.      omeprazole (PRILOSEC) 20 MG capsule Take 20 mg by mouth daily. May take a second 20 mg dose as needed for heartburn     oxybutynin (DITROPAN) 5 MG tablet Take 5 mg by mouth 2 (two) times daily.      potassium chloride (KLOR-CON) 10 MEQ tablet Take 1 tablet (10 mEq total) by mouth daily. 90 tablet 3   predniSONE (DELTASONE) 10 MG tablet TAKE 3 TABLETS BY MOUTH  DAILY AS NEEDED 90 tablet 0   sertraline (ZOLOFT) 50 MG tablet TAKE 1 TABLET BY MOUTH  DAILY 90 tablet 3   carvedilol (COREG) 3.125 MG tablet TAKE 1 TABLET BY MOUTH  TWICE DAILY WITH MEALS 180 tablet 3   furosemide (LASIX) 20 MG tablet Take 1 tablet (20 mg total) by mouth daily. 90 tablet 3   No facility-administered medications prior to visit.      Allergies:   Propoxyphene hcl, Pollen extract, Camphor, Dust mite extract, Latex, Molds & smuts, and Tomato   Social History   Socioeconomic History   Marital status: Married    Spouse name: Milbert Coulter   Number of children: 4   Years of education: Not on file   Highest education level: Not on file  Occupational History   Occupation: disability    Employer: UNEMPLOYED    Comment: since 2008, knee surgery  Tobacco Use   Smoking status: Former    Packs/day: 1.00    Years: 27.00    Pack years: 27.00    Types: Cigarettes    Quit date: 12/26/1985    Years since quitting: 35.7   Smokeless tobacco: Never   Tobacco comments:    Quit in 1987  Substance and Sexual Activity   Alcohol use: Yes    Alcohol/week: 0.0 standard drinks    Comment: occasional wine   Drug use: No   Sexual activity: Yes    Birth control/protection: Surgical  Other Topics Concern   Not on file  Social History Narrative   Currently married for 10 years. Husband's name is Milbert Coulter. She is an excellent singer. 8 children between she and her husband. 23 grand children. 12 great grandchildren.   Social Determinants of Health   Financial Resource Strain: Not on file  Food Insecurity: Not on file  Transportation Needs: Not on file  Physical Activity: Not on file  Stress: Not on file  Social Connections: Not on file     Family History:  The patient's family history includes Heart attack in her father, sister, sister, and sister; Lung cancer in her maternal  grandmother; Obesity in her brother; Osteoarthritis in her brother and sister; Stroke in her sister.   ROS General: Negative; No fevers, chills, or night sweats;  HEENT: Negative; No changes in vision or hearing, sinus congestion, difficulty swallowing Pulmonary: Negative; No cough, wheezing, shortness of breath, hemoptysis Cardiovascular: See HPI GI: Negative; No nausea, vomiting, diarrhea, or abdominal pain GU: Negative; No dysuria, hematuria, or difficulty  voiding Musculoskeletal: Negative; no myalgias, joint pain, or weakness Hematologic/Oncology: History of breast cancer, status post right mastectomy.  History of renal cancer. Endocrine: Negative; no heat/cold intolerance; no diabetes Neuro: Negative; no changes in balance, headaches Skin: Negative; No rashes or skin lesions Psychiatric: Negative; No behavioral problems, depression Sleep: Negative; No snoring, daytime sleepiness, hypersomnolence, bruxism, restless legs, hypnogognic hallucinations, no cataplexy Other comprehensive 14 point system review is negative.   PHYSICAL EXAM:   VS:  BP 130/62    Pulse 73    Ht _0  (1.676 m)    Wt 230 lb 6.4 oz (104.5 kg)    SpO2 99%    BMI 37.19 kg/m     Repeat blood pressure by me was 130/64  Wt Readings from Last 3 Encounters:  09/12/21 230 lb 6.4 oz (104.5 kg)  08/07/21 236 lb 12.8 oz (107.4 kg)  07/17/21 224 lb 12.8 oz (102 kg)       Physical Exam BP 130/62    Pulse 73    Ht _1  (1.676 m)    Wt 230 lb 6.4 oz (104.5 kg)    SpO2 99%    BMI 37.19 kg/m  General: Alert, oriented, no distress.  Skin: normal turgor, no rashes, warm and dry HEENT: Normocephalic, atraumatic. Pupils equal round and reactive to light; sclera anicteric; extraocular muscles intact;  Nose without nasal septal hypertrophy Mouth/Parynx benign; Mallinpatti scale 3 Neck: No JVD, no carotid bruits; normal carotid upstroke Lungs: clear to ausculatation and percussion; no wheezing or rales Chest wall: without tenderness to palpitation Heart: PMI not displaced, RRR, s1 s2 normal, 1/6 systolic murmur, no diastolic murmur, no rubs, gallops, thrills, or heaves Abdomen: soft, nontender; no hepatosplenomehaly, BS+; abdominal aorta nontender and not dilated by palpation. Back: no CVA tenderness Pulses 2+ Musculoskeletal: full range of motion, normal strength, no joint deformities Extremities: 1-2+ right lower extremity ankle edema, 1+ left lower extremity; no clubbing  cyanosis or edema, Homan's sign negative  Neurologic: grossly nonfocal; Cranial nerves grossly wnl Psychologic: Normal mood and affect   Studies/Labs Reviewed:   ECG (independently read by me):  NSR at 73; biatrial enlargement ; LAD; QTc 467  July 17, 2021 ECG (independently read by me):  NSR at 73, LAE, LAD, Reduced anteriorly  September 24, 20212 ECG (independently read by me): Normal sinus rhythm at 62 bpm.  Possible left atrial enlargement.  Low voltage anteriorly.  Recent Labs: BMP Latest Ref Rng & Units 08/08/2021 10/14/2020 06/07/2020  Glucose 70 - 99 mg/dL 97 99 98  BUN 8 - 27 mg/dL _2 Creatinine 0.57 - 1.00 mg/dL 0.86 0.72 0.72  BUN/Creat Ratio 12 - 28 12 - NOT APPLICABLE  Sodium 381 - 144 mmol/L 140 134(L) 137  Potassium 3.5 - 5.2 mmol/L 4.1 3.9 3.9  Chloride 96 - 106 mmol/L 101 100 102  CO2 20 - 29 mmol/L _3 Calcium 8.7 - 10.3 mg/dL 8.4(L) 8.7(L) 8.8     Hepatic Function Latest Ref Rng & Units 10/14/2020 03/28/2020 09/25/2019  Total Protein 6.5 - 8.1 g/dL 6.9 7.1 7.0  Albumin 3.5 - 5.0 g/dL 3.1(L) 3.4(L) 3.2(L)  AST 15 - 41 U/L 37 32 44(H)  ALT 0 - 44 U/L 29 30 35  Alk Phosphatase 38 - 126 U/L 73 53 44  Total Bilirubin 0.3 - 1.2 mg/dL 0.7 0.6 0.5  Bilirubin, Direct 0.0 - 0.3 mg/dL - - -    CBC Latest Ref Rng & Units 10/14/2020 03/28/2020 03/06/2020  WBC 4.0 - 10.5 K/uL 7.1 7.1 7.7  Hemoglobin 12.0 - 15.0 g/dL 13.8 13.0 12.1  Hematocrit 36.0 - 46.0 % 41.8 40.1 36.2  Platelets 150 - 400 K/uL 282 215 198   Lab Results  Component Value Date   MCV 97.7 10/14/2020   MCV 98.0 03/28/2020   MCV 96.3 03/06/2020   Lab Results  Component Value Date   TSH 1.504 03/04/2020   No results found for: HGBA1C   BNP No results found for: BNP  ProBNP No results found for: PROBNP   Lipid Panel     Component Value Date/Time   CHOL 141 06/07/2020 0833   TRIG 71 06/07/2020 0833   HDL 57 06/07/2020 0833   CHOLHDL 2.5 06/07/2020 0833   VLDL 14 03/06/2020  0420   LDLCALC 69 06/07/2020 0833     RADIOLOGY: No results found.   Additional studies/ records that were reviewed today include:  I reviewed the records from the patient's recent hospitalization as well as follow-up office visit with Rosaria Ferries, PA  ECHO: 07/31/2021 IMPRESSIONS   1. Left ventricular ejection fraction, by estimation, is 55 to 60%. Left  ventricular ejection fraction by 3D volume is 57 %. The left ventricle has  normal function. The left ventricle has no regional wall motion  abnormalities. Left ventricular diastolic   parameters were normal.   2. Right ventricular systolic function is normal. The right ventricular  size is normal. There is normal pulmonary artery systolic pressure. The  estimated right ventricular systolic pressure is 10.9 mmHg.   3. The pericardial effusion is posterior to the left ventricle and  localized near the right atrium.   4. The mitral valve is normal in structure. Trivial mitral valve  regurgitation. No evidence of mitral stenosis.   5. The aortic valve is tricuspid. Aortic valve regurgitation is not  visualized. Mild aortic valve sclerosis is present, with no evidence of  aortic valve stenosis.   6. The inferior vena cava is normal in size with greater than 50%  respiratory variability, suggesting right atrial pressure of 3 mmHg.   ASSESSMENT:    1. Essential hypertension   2. SOB (shortness of breath)   3. Palpitations   4. SVT (supraventricular tachycardia) ([place])   5. Coronary artery calcification   6. Bilateral leg edema   7. Hyperlipidemia with target LDL less than 70     PLAN:  Sara [name] is a pleasant 75 year old female who has a history of hypertension,  hyperlipidemia, hypothyroidism, GERD, reported mild OSA not on therapy.  In the past, she has experienced episodes of atypical chest pain, mostly of musculoskeletal etiology.  A coronary CTA showed mild disease and I suspect there was blooming artifact in the RCA.   FFR analysis was normal.  On prior evaluation she has experienced costochondral tenderness.  Since her recent evaluation she has had lower extremity edema which has continued despite switching HCTZ to furosemide.  In addition, she has experienced episodic palpitations.  I reviewed her 14-day monitor with her which showed a predominant sinus rhythm with average heart rate 73 bpm  and the slowest heart rate 53 bpm.  Her fastest heart rate was 174 bpm consisting of a 4 beat SVT episode.  She had 17 episodes of SVT with the longest lasting 20 beats at an average rate at 128 bpm.  There was 1 run of nonsustained VT lasting 4 beats at a rate of 156 bpm.  Presently I am recommending she discontinue carvedilol.  I will initially start metoprolol tartrate 25 mg twice a day and depending upon her clinical response this will be further titrated as needed.  With her lower extremity edema right greater than left I am increasing furosemide to 40 mg daily.  We again discussed the importance of sodium restriction.  Recent laboratory was reviewed.  Creatinine was 0.86 and potassium 4.1 on August 08, 2021.  She will contact our office if palpitations continue or if lower extremity edema persist.  Support stockings were recommended.  She continues to be on atorvastatin 20 mg for hyperlipidemia.  LDL cholesterol in September 2021 was 69.  Dr. [name] is her primary MD who checks laboratory.  She continues to be followed by Dr. Delton Coombes with her history of breast CVA.  I will see her in 2 months for reevaluation or sooner as needed.  Medication Adjustments/Labs and Tests Ordered: Current medicines are reviewed at length with the patient today.  Concerns regarding medicines are outlined above.  Medication changes, Labs and Tests ordered today are listed in the Patient Instructions below. Patient Instructions  Medication Instructions:  STOP Carvedilol   START Metoprolol Tartrate 25 mg twice daily (if you continue to notify  palpitations, you can increase to 50 in the morning, and 25 at night)   INCREASE Furosemide to 40 mg daily   *If you need a refill on your cardiac medications before your next appointment, please call your pharmacy*   Follow-Up: At [facility], you and your health needs are our priority.  As part of our continuing mission to provide you with exceptional heart care, we have created designated Provider Care Teams.  These Care Teams include your primary Cardiologist (physician) and Advanced Practice Providers (APPs -  Physician Assistants and Nurse Practitioners) who all work together to provide you with the care you need, when you need it.  We recommend signing up for the patient portal called "MyChart".  Sign up information is provided on this After Visit Summary.  MyChart is used to connect with patients for Virtual Visits (Telemedicine).  Patients are able to view lab/test results, encounter notes, upcoming appointments, etc.  Non-urgent messages can be sent to your provider as well.   To learn more about what you can do with MyChart, go to [email or web address].    Your next appointment:   2-3 month(s)  The format for your next appointment:   In Person  Provider:   Shelva Majestic, MD        Signed, Shelva Majestic, MD  09/15/2021 2:47 PM    [facility] [address] [place], [place], [place]  [address] Phone: [phone]

## 2021-09-12 NOTE — Patient Instructions (Signed)
Medication Instructions:  STOP Carvedilol   START Metoprolol Tartrate 25 mg twice daily (if you continue to notify palpitations, you can increase to 50 in the morning, and 25 at night)   INCREASE Furosemide to 40 mg daily   *If you need a refill on your cardiac medications before your next appointment, please call your pharmacy*   Follow-Up: At [facility], you and your health needs are our priority.  As part of our continuing mission to provide you with exceptional heart care, we have created designated Provider Care Teams.  These Care Teams include your primary Cardiologist (physician) and Advanced Practice Providers (APPs -  Physician Assistants and Nurse Practitioners) who all work together to provide you with the care you need, when you need it.  We recommend signing up for the patient portal called "MyChart".  Sign up information is provided on this After Visit Summary.  MyChart is used to connect with patients for Virtual Visits (Telemedicine).  Patients are able to view lab/test results, encounter notes, upcoming appointments, etc.  Non-urgent messages can be sent to your provider as well.   To learn more about what you can do with MyChart, go to [email or web address].    Your next appointment:   2-3 month(s)  The format for your next appointment:   In Person  Provider:   Shelva Majestic, MD

## 2021-09-15 ENCOUNTER — Encounter: Payer: Self-pay | Admitting: Cardiovascular Disease

## 2021-09-17 ENCOUNTER — Encounter: Payer: Self-pay | Admitting: Cardiovascular Disease

## 2021-09-28 ENCOUNTER — Encounter: Payer: Self-pay | Admitting: Cardiovascular Disease

## 2021-10-13 DIAGNOSIS — E039 Hypothyroidism, unspecified: Secondary | ICD-10-CM | POA: Diagnosis not present

## 2021-10-13 DIAGNOSIS — E785 Hyperlipidemia, unspecified: Secondary | ICD-10-CM | POA: Diagnosis not present

## 2021-10-13 DIAGNOSIS — R0683 Snoring: Secondary | ICD-10-CM | POA: Diagnosis not present

## 2021-10-13 DIAGNOSIS — M545 Low back pain, unspecified: Secondary | ICD-10-CM | POA: Diagnosis not present

## 2021-10-14 ENCOUNTER — Other Ambulatory Visit: Payer: Self-pay

## 2021-10-14 ENCOUNTER — Other Ambulatory Visit (HOSPITAL_COMMUNITY): Payer: Self-pay | Admitting: Family Medicine

## 2021-10-14 ENCOUNTER — Ambulatory Visit (HOSPITAL_COMMUNITY)
Admission: RE | Admit: 2021-10-14 | Discharge: 2021-10-14 | Disposition: A | Discharge status: Home / Self Care | Payer: Medicare Other | Source: Ambulatory Visit | Attending: Family Medicine | Admitting: Family Medicine

## 2021-10-14 ENCOUNTER — Other Ambulatory Visit (HOSPITAL_COMMUNITY): Payer: Self-pay

## 2021-10-14 DIAGNOSIS — M2578 Osteophyte, vertebrae: Secondary | ICD-10-CM | POA: Diagnosis not present

## 2021-10-14 DIAGNOSIS — M545 Low back pain, unspecified: Secondary | ICD-10-CM

## 2021-10-14 DIAGNOSIS — D0511 Intraductal carcinoma in situ of right breast: Secondary | ICD-10-CM

## 2021-10-14 DIAGNOSIS — E559 Vitamin D deficiency, unspecified: Secondary | ICD-10-CM

## 2021-10-14 DIAGNOSIS — M5137 Other intervertebral disc degeneration, lumbosacral region: Secondary | ICD-10-CM | POA: Diagnosis not present

## 2021-10-14 DIAGNOSIS — C649 Malignant neoplasm of unspecified kidney, except renal pelvis: Secondary | ICD-10-CM

## 2021-10-14 NOTE — Progress Notes (Signed)
Orders placed per Faythe Casa, NP note from 10/15/20.

## 2021-10-15 ENCOUNTER — Ambulatory Visit (HOSPITAL_COMMUNITY)
Admission: RE | Admit: 2021-10-15 | Discharge: 2021-10-15 | Disposition: A | Discharge status: Home / Self Care | Payer: Medicare Other | Source: Ambulatory Visit | Attending: Oncology | Admitting: Oncology

## 2021-10-15 ENCOUNTER — Inpatient Hospital Stay (HOSPITAL_COMMUNITY): Payer: Medicare Other | Attending: Hematology

## 2021-10-15 ENCOUNTER — Other Ambulatory Visit: Payer: Self-pay | Admitting: Orthopedic Surgery

## 2021-10-15 DIAGNOSIS — D0511 Intraductal carcinoma in situ of right breast: Secondary | ICD-10-CM | POA: Diagnosis not present

## 2021-10-15 DIAGNOSIS — Z1231 Encounter for screening mammogram for malignant neoplasm of breast: Secondary | ICD-10-CM | POA: Insufficient documentation

## 2021-10-15 DIAGNOSIS — E559 Vitamin D deficiency, unspecified: Secondary | ICD-10-CM | POA: Diagnosis not present

## 2021-10-15 DIAGNOSIS — C649 Malignant neoplasm of unspecified kidney, except renal pelvis: Secondary | ICD-10-CM

## 2021-10-15 LAB — COMPREHENSIVE METABOLIC PANEL
ALT: 26 U/L (ref 0–44)
AST: 29 U/L (ref 15–41)
Albumin: 2.8 g/dL — ABNORMAL LOW (ref 3.5–5.0)
Alkaline Phosphatase: 69 U/L (ref 38–126)
Anion gap: 8 (ref 5–15)
BUN: 13 mg/dL (ref 8–23)
CO2: 26 mmol/L (ref 22–32)
Calcium: 8 mg/dL — ABNORMAL LOW (ref 8.9–10.3)
Chloride: 103 mmol/L (ref 98–111)
Creatinine, Ser: 0.95 mg/dL (ref 0.44–1.00)
GFR, Estimated: >60 mL/min (ref >60)
Glucose, Bld: 150 mg/dL — ABNORMAL HIGH (ref 70–99)
Potassium: 3.9 mmol/L (ref 3.5–5.1)
Sodium: 137 mmol/L (ref 135–145)
Total Bilirubin: 0.4 mg/dL (ref 0.3–1.2)
Total Protein: 6.1 g/dL — ABNORMAL LOW (ref 6.5–8.1)

## 2021-10-15 LAB — CBC WITH DIFFERENTIAL/PLATELET
Abs Immature Granulocytes: 0.03 10*3/uL (ref 0.00–0.07)
Basophils Absolute: 0 10*3/uL (ref 0.0–0.1)
Basophils Relative: 0 %
Eosinophils Absolute: 0 10*3/uL (ref 0.0–0.5)
Eosinophils Relative: 0 %
HCT: 43.5 % (ref 36.0–46.0)
Hemoglobin: 14.7 g/dL (ref 12.0–15.0)
Immature Granulocytes: 0 %
Lymphocytes Relative: 14 %
Lymphs Abs: 1.4 10*3/uL (ref 0.7–4.0)
MCH: 33.8 pg (ref 26.0–34.0)
MCHC: 33.8 g/dL (ref 30.0–36.0)
MCV: 100 fL (ref 80.0–100.0)
Monocytes Absolute: 0.5 10*3/uL (ref 0.1–1.0)
Monocytes Relative: 5 %
Neutro Abs: 8.5 10*3/uL — ABNORMAL HIGH (ref 1.7–7.7)
Neutrophils Relative %: 81 %
Platelets: 224 10*3/uL (ref 150–400)
RBC: 4.35 MIL/uL (ref 3.87–5.11)
RDW: 13 % (ref 11.5–15.5)
WBC: 10.5 10*3/uL (ref 4.0–10.5)
nRBC: 0 % (ref 0.0–0.2)

## 2021-10-15 LAB — VITAMIN D 25 HYDROXY (VIT D DEFICIENCY, FRACTURES): Vit D, 25-Hydroxy: 56.2 ng/mL (ref 30–100)

## 2021-10-15 LAB — LACTATE DEHYDROGENASE: LDH: 200 U/L — ABNORMAL HIGH (ref 98–192)

## 2021-10-15 LAB — FOLATE: Folate: 32.6 ng/mL (ref >5.9)

## 2021-10-15 LAB — VITAMIN B12: Vitamin B-12: 701 pg/mL (ref 180–914)

## 2021-10-16 ENCOUNTER — Other Ambulatory Visit (HOSPITAL_COMMUNITY): Payer: Self-pay | Admitting: Oncology

## 2021-10-16 DIAGNOSIS — R928 Other abnormal and inconclusive findings on diagnostic imaging of breast: Secondary | ICD-10-CM

## 2021-10-21 ENCOUNTER — Other Ambulatory Visit: Payer: Self-pay

## 2021-10-21 ENCOUNTER — Ambulatory Visit (HOSPITAL_COMMUNITY)
Admission: RE | Admit: 2021-10-21 | Discharge: 2021-10-21 | Disposition: A | Discharge status: Home / Self Care | Payer: Medicare Other | Source: Ambulatory Visit | Attending: Oncology | Admitting: Oncology

## 2021-10-21 DIAGNOSIS — R928 Other abnormal and inconclusive findings on diagnostic imaging of breast: Secondary | ICD-10-CM

## 2021-10-21 DIAGNOSIS — R922 Inconclusive mammogram: Secondary | ICD-10-CM | POA: Diagnosis not present

## 2021-10-22 ENCOUNTER — Encounter (HOSPITAL_COMMUNITY): Payer: Self-pay

## 2021-10-22 ENCOUNTER — Ambulatory Visit (HOSPITAL_COMMUNITY): Payer: Medicare Other | Admitting: Hematology

## 2021-11-04 NOTE — Progress Notes (Signed)
Sara [name], [place] [address]   CLINIC:  Medical Oncology/Hematology  PCP:  Sara [name], [address] / [place] [address] [phone]   REASON FOR VISIT:  Follow-up for history of right breast DCIS/LCIS  PRIOR THERAPY: - Right simple mastectomy (11/20/2014) - Tamoxifen (01/01/2015 through 01/19/2020)  CURRENT THERAPY: Surveillance  BRIEF ONCOLOGIC HISTORY:  Oncology History  Malignant neoplasm of upper outer quadrant of female breast ([place])  09/05/2014 Imaging   assymetry noted in R breast on screening mammogram   09/18/2014 Imaging   Irregular mass in the 12 o'clock location of the right breast. Tissue diagnosis is recommended.   09/18/2014 Imaging   IMPRESSION: Irregular mass in the 12 o'clock location of the right breast. Tissue diagnosis is recommended.   10/09/2014 Imaging   8 x 12 x 8 CM abnormal linear clumped enhancement throughout the majority of the central and upper right breast.   10/17/2014 Initial Biopsy   Breast, right, needle core biopsy, central - DUCTAL CARCINOMA IN SITU, MIXED PATTERNS (CRIBRIFORM, COMEDO AND PAPILLARY), INVOLVING MULTIPLE CORES, AT LEAST 9 MM IN MAXIMAL EXTENT IN ANY ONE CORE. - FOCAL LOBULAR NEOPLASIA (ALH/LCIS). - NO INVASIVE CAR   11/20/2014 Definitive Surgery   Breast, simple mastectomy, right - DUCTAL CARCINOMA IN SITU, SEE COMMENT. - LOBULAR CARCINOMA IN SITU (CONVENTIONAL AND PLEOMORPHIC TYPES). - PREVIOUS BIOPSY SITES (X2). - IN SITU CARCINOMA IS 3CM FROM NEAREST MARGIN (DEEP).   01/01/2015 -  Anti-estrogen oral therapy   Tamoxifen daily.   07/21/2016 Breast US   BIRADS 4   07/29/2016 Procedure   Ultrasound guided biopsy of right axillary mass/lymph node. No apparent complications.   07/30/2016 Pathology Results   Pathology of the right axillary lymph node biopsy revealed LYMPHOID TISSUE WITH DERMATOPATHIC CHANGE. NO METASTATIC CARCINOMA IDENTIFIED.    Renal cell carcinoma  ([place])  01/08/2016 Procedure   Robotic-assisted laparoscopic right partial nephrectomy by Sara [name].   01/08/2016 Pathology Results   Kidney, wedge excision / partial resection, right renal mass RENAL CELL CARCINOMA, CLEAR CELL TYPE, WHO NUCLEAR GRADE 2 (2.4 CM) THE TUMOR IS CONFINED TO THE KIDNEY MARGINS OF RESECTION ARE NEGATIVE FOR TUMOR     [name] STAGING: [name] Staging  Malignant neoplasm of upper outer quadrant of female breast ([place]) Staging form: Breast, AJCC 7th Edition - Clinical: Stage 0 (Tis (DCIS), N0, M0) - Signed by Sara Silversmith, MD on 10/11/2014  Renal cell carcinoma [facility]) Staging form: Kidney, AJCC 7th Edition - Clinical stage from 06/02/2016: Stage Unknown (T1a, NX, M0) - Signed by Sara [name] on 06/02/2016   INTERVAL HISTORY:  Sara [name], a 76 y.o. female, returns for routine follow-up of her history of right breast DCIS/LCIS. Sara [name] was last seen on 10/15/2020 by Sara [name].   At today's visit, she  reports feeling fair.  She denies any recent hospitalizations, surgeries, or changes in her  baseline health status.  Most recent unilateral left breast screening mammogram (10/15/2021) suggested possible asymmetry in the left breast.  Diagnostic mammogram (10/21/2021) did not show any mammographic evidence of malignancy in either breast, and the initially question possible left breast asymmetry was felt to resolve with postbiopsy changes/scarring in the left breast felt to be stable in appearance.  She complains of chronic right arm lymphedema.  She was previously seen at the lymphedema clinic, but chose not follow-up due to cost.  She has intermittent right chest wall pain that "feels  like muscle strain," it is worsened with movement of her right arm.  She also reports that she has some left breast tenderness that comes and goes.  Describes some intermittent burning chest pain that occurs without obvious inciting event, and lasts less than 1  minute before resolving.  She does also report some worsening dyspnea, as well as symptoms of orthopnea.  She reports that she follows with cardiologist regarding these symptoms.  She admits to fatigue and chronic generalized pain.  She has not noticed any new lumps or bumps.  She denies any new neurologic deficits.  No B symptoms such as fever, chills, night sweats, unintentional weight loss.  She reports little to no energy and 50% appetite.  She is maintaining stable weight at this time.   REVIEW OF SYSTEMS:  Review of Systems  Constitutional:  Positive for fatigue. Negative for appetite change, chills, diaphoresis, fever and unexpected weight change.  HENT:   Negative for lump/mass and nosebleeds.   Eyes:  Negative for eye problems.  Respiratory:  Positive for cough (at night) and shortness of breath (with exertion and laying flat). Negative for hemoptysis.   Cardiovascular:  Positive for chest pain (intermittent burning chest pain), leg swelling and palpitations.  Gastrointestinal:  Negative for abdominal pain, blood in stool, constipation, diarrhea, nausea and vomiting.  Genitourinary:  Negative for hematuria.   Musculoskeletal:  Positive for arthralgias, back pain and myalgias.  Skin: Negative.   Neurological:  Positive for dizziness, headaches and numbness. Negative for light-headedness.  Hematological:  Does not bruise/bleed easily.  Psychiatric/Behavioral:  Positive for sleep disturbance.    PAST MEDICAL/SURGICAL HISTORY:  Past Medical History:  Diagnosis Date   Allergic rhinitis due to pollen    Anxiety    Arthritis    "knees, back, right elbow; left shoulder; right ankle" (11/20/2014)   Breast [name], right breast ([place]) 2016   "DCIS; zero stage" S/P mastectomy, [name] free since then   Cervical spondylosis without myelopathy    Cervicalgia    Chronic lower back pain    Constipation    takes Miralax daily   Degeneration of cervical intervertebral disc    Depression     Diverticulosis of colon (without mention of hemorrhage)    GERD (gastroesophageal reflux disease)    takes Omeprazole daily   Graves' disease    "took a pill to correct"   H/O hiatal hernia    H/O urinary frequency    Headache(784.0)    denies migraines since the 80's but has occ and takes Butalbital prn   HTN (hypertension)    takes Metoprolol and Lisinopril daily   Insomnia    takes Ativan nigtly   Joint pain    "all of them"   Joint swelling    "knees, legs, ankles sometimes" (11/20/2014)   Morbid obesity ([place])    Numbness    LOWER LEGS   Other postablative hypothyroidism    takes SYnthroid daily   Palpitations    Peptic ulcer, unspecified site, unspecified as acute or chronic, without mention of hemorrhage, perforation, or obstruction    Primary localized osteoarthrosis, lower leg    Pure hypercholesterolemia    takes Pravastin daily   Recurrent UTI    Renal cell carcinoma ([place]) 01/08/2016   Right renal mass s/p partial right nephrectomy, [name] free since 2017   Shortness of breath    Sleep apnea    slight but doesn't require a CPAP (11/20/2014)   Tendonitis of knee    bilateral  Past Surgical History:  Procedure Laterality Date   ABDOMINAL HYSTERECTOMY     ANTERIOR LAT LUMBAR FUSION  05/13/2012   Procedure: ANTERIOR LATERAL LUMBAR FUSION 2 LEVELS;  Surgeon: Sara Ghee, MD;  Location: [facility];  Service: Neurosurgery;  Laterality: Left;  Left Lumbar Three-four,Lumbar four-five Extreme Lumbar Interbody Fusion with Percutaneous Pedicle Screws   APPENDECTOMY     BREAST BIOPSY Left ~ 2014   BREAST BIOPSY Right 2015   BREAST LUMPECTOMY Left 1988   BREAST RECONSTRUCTION WITH PLACEMENT OF TISSUE EXPANDER AND FLEX HD (ACELLULAR HYDRATED DERMIS) Right 11/20/2014   BREAST RECONSTRUCTION WITH PLACEMENT OF TISSUE EXPANDER AND FLEX HD (ACELLULAR HYDRATED DERMIS) Right 11/20/2014   Procedure: RIGHT BREAST RECONSTRUCTION WITH PLACEMENT OF TISSUE EXPANDER AND ACELLULAR DERMA  MATRIX (ACELLULAR DERMA MATRIX);  Surgeon: Crissie Reese, MD;  Location: [facility];  Service: Plastics;  Laterality: Right;   CATARACT EXTRACTION W/ INTRAOCULAR LENS  IMPLANT, BILATERAL Bilateral    COLONOSCOPY  Sept 2008   RMR: normal rectum, left-sided diverticula, repeat in 2018   COLONOSCOPY WITH PROPOFOL N/A 03/21/2018   Dr. Gala Romney: diverticulosis, internal grade I hemorrhoids. no future colonoscopies unless develops new symptoms   DIAGNOSTIC LAPAROSCOPY     DILATION AND CURETTAGE OF UTERUS     ESOPHAGOGASTRODUODENOSCOPY     ESOPHAGOGASTRODUODENOSCOPY  2013   Dr. Gala Romney: Cervical esophageal web and Schatzki's ring s/p dilation, small hiatal hernia, antral and duodenal erosions likely NSAID effect, chronic duodenitis on path    ESOPHAGOGASTRODUODENOSCOPY (EGD) WITH PROPOFOL N/A 03/21/2018   Dr. Gala Romney: mild Schatzki ring s/p dilation   FOREIGN BODY REMOVAL Right 10/06/2013   Procedure: REMOVAL FOREIGN BODY EXTREMITY;  Surgeon: Carole Civil, MD;  Location: [facility];  Service: Orthopedics;  Laterality: Right;   INJECTION KNEE Left 10/06/2013   Procedure: KNEE INJECTION;  Surgeon: Carole Civil, MD;  Location: [facility];  Service: Orthopedics;  Laterality: Left;   JOINT REPLACEMENT Bilateral    knees   KNEE ARTHROSCOPY Right    KNEE ARTHROSCOPY WITH LATERAL MENISECTOMY Right 10/06/2013   Procedure: KNEE ARTHROSCOPY WITH LATERAL AND MEDIAL MENISECTOMY;  Surgeon: Carole Civil, MD;  Location: [facility];  Service: Orthopedics;  Laterality: Right;  END @ 1234   lumpectomy on pelvis     "mass of nerves"   [place] N/A 03/21/2018   Procedure: MALONEY DILATION;  Surgeon: Daneil Dolin, MD;  Location: [facility];  Service: Endoscopy;  Laterality: N/A;   MASTECTOMY COMPLETE / SIMPLE W/ SENTINEL NODE BIOPSY Right 11/20/2014   axillary   REMOVAL OF TISSUE EXPANDER AND PLACEMENT OF IMPLANT Right 12/13/2014   Procedure: REMOVAL OF RIGHT BREAST TISSUE EXPANDER;  Surgeon: Crissie Reese, MD;  Location:  [facility];  Service: Plastics;  Laterality: Right;   ROBOTIC ASSITED PARTIAL NEPHRECTOMY Right 01/08/2016   Procedure: XI ROBOTIC ASSITED PARTIAL NEPHRECTOMY;  Surgeon: Alexis Frock, MD;  Location: [facility];  Service: Urology;  Laterality: Right;   SIMPLE MASTECTOMY WITH AXILLARY SENTINEL NODE BIOPSY Right 11/20/2014   Procedure: SIMPLE MASTECTOMY WITH AXILLARY SENTINEL NODE BIOPSY;  Surgeon: Erroll Luna, MD;  Location: [facility];  Service: General;  Laterality: Right;   TISSUE EXPANDER REMOVAL Right 12/13/2014   dr Harlow Mares   TONSILLECTOMY     TOTAL KNEE ARTHROPLASTY Right 01/24/2014   Procedure: TOTAL KNEE ARTHROPLASTY;  Surgeon: Carole Civil, MD;  Location: [facility];  Service: Orthopedics;  Laterality: Right;   TOTAL KNEE ARTHROPLASTY Left 08/08/2014   Procedure: TOTAL KNEE ARTHROPLASTY;  Surgeon: Carole Civil, MD;  Location: [facility];  Service: Orthopedics;  Laterality: Left;   TUBAL LIGATION     UPPER GASTROINTESTINAL ENDOSCOPY      SOCIAL HISTORY:  Social History   Socioeconomic History   Marital status: Married    Spouse name: Milbert Coulter   Number of children: 4   Years of education: Not on file   Highest education level: Not on file  Occupational History   Occupation: disability    Employer: UNEMPLOYED    Comment: since 2008, knee surgery  Tobacco Use   Smoking status: Former    Packs/day: 1.00    Years: 27.00    Pack years: 27.00    Types: Cigarettes    Quit date: 12/26/1985    Years since quitting: 35.8   Smokeless tobacco: Never   Tobacco comments:    Quit in 1987  Substance and Sexual Activity   Alcohol use: Yes    Alcohol/week: 0.0 standard drinks    Comment: occasional wine   Drug use: No   Sexual activity: Yes    Birth control/protection: Surgical  Other Topics Concern   Not on file  Social History Narrative   Currently married for 10 years. Husband's name is Milbert Coulter. She is an excellent singer. 8 children between she and her husband. 23 grand children. 12 great  grandchildren.   Social Determinants of Health   Financial Resource Strain: Not on file  Food Insecurity: Not on file  Transportation Needs: Not on file  Physical Activity: Not on file  Stress: Not on file  Social Connections: Not on file  Intimate Partner Violence: Not on file    FAMILY HISTORY:  Family History  Problem Relation Age of Onset   Stroke Sister    Heart attack Sister        In her 72s   Osteoarthritis Sister    Heart attack Sister        In her 35s   Osteoarthritis Brother    Obesity Brother    Heart attack Sister        In her 64s   Heart attack Father        He died at age 28   Lung [name] Maternal Grandmother        non-smoker   Colon [name] Neg Hx        Mother died at 55 from blood clot, MI    CURRENT MEDICATIONS:  Current Outpatient Medications  Medication Sig Dispense Refill   acetaminophen (TYLENOL) 500 MG tablet Take 650 mg by mouth in the morning and at bedtime.     aspirin EC 81 MG tablet Take 81 mg by mouth daily. Swallow whole.     atorvastatin (LIPITOR) 20 MG tablet Take 1 tablet (20 mg total) by mouth daily. 30 tablet 11   diphenhydrAMINE (BENADRYL) 50 MG capsule Take 50 mg by mouth at bedtime.     ergocalciferol (VITAMIN D2) 1.25 MG (50000 UT) capsule Take 1 capsule (50,000 Units total) by mouth once a week. 16 capsule 3   furosemide (LASIX) 40 MG tablet Take 1 tablet (40 mg total) by mouth daily. 90 tablet 3   gabapentin (NEURONTIN) 600 MG tablet TAKE 1 TABLET BY MOUTH  TWICE DAILY 180 tablet 3   levothyroxine (SYNTHROID, LEVOTHROID) 88 MCG tablet Take 88 mcg by mouth daily before breakfast.      metoprolol tartrate (LOPRESSOR) 25 MG tablet Take 1 tablet (25 mg total) by mouth 2 (two) times daily. [place]  tablet 3   omeprazole (PRILOSEC) 20 MG capsule Take 20 mg by mouth daily. May take a second 20 mg dose as needed for heartburn     oxybutynin (DITROPAN) 5 MG tablet Take 5 mg by mouth 2 (two) times daily.      potassium chloride (KLOR-CON)  10 MEQ tablet Take 1 tablet (10 mEq total) by mouth daily. 90 tablet 3   predniSONE (DELTASONE) 10 MG tablet TAKE 3 TABLETS BY MOUTH DAILY AS NEEDED 90 tablet 0   sertraline (ZOLOFT) 50 MG tablet TAKE 1 TABLET BY MOUTH  DAILY 90 tablet 3   No current facility-administered medications for this visit.    ALLERGIES:  Allergies  Allergen Reactions   Propoxyphene Hcl Other (See Comments)     Hallucinations   Pollen Extract Other (See Comments)    Sneezing, congestion   Camphor Itching   Dust Mite Extract Itching    Sneezing. Runny nose    Latex Dermatitis and Rash   Molds & Smuts Itching   Tomato Hives    PHYSICAL EXAM:  Performance status (ECOG): 1 - Symptomatic but completely ambulatory  There were no vitals filed for this visit. Wt Readings from Last 3 Encounters:  09/12/21 230 lb 6.4 oz (104.5 kg)  08/07/21 236 lb 12.8 oz (107.4 kg)  07/17/21 224 lb 12.8 oz (102 kg)   Physical Exam Constitutional:      Appearance: Normal appearance. She is obese.  HENT:     Head: Normocephalic and atraumatic.     Mouth/Throat:     Mouth: Mucous membranes are moist.  Eyes:     Extraocular Movements: Extraocular movements intact.     Pupils: Pupils are equal, round, and reactive to light.  Cardiovascular:     Rate and Rhythm: Normal rate and regular rhythm.     Pulses: Normal pulses.     Heart sounds: Normal heart sounds.  Pulmonary:     Effort: Pulmonary effort is normal.     Breath sounds: Normal breath sounds.  Chest:  Breasts:    Right: Absent.     Left: Tenderness (left lateral breast) present. No swelling, bleeding, inverted nipple, mass, nipple discharge or skin change.    Abdominal:     General: Bowel sounds are normal.     Palpations: Abdomen is soft.     Tenderness: There is no abdominal tenderness.  Musculoskeletal:        General: No swelling.     Right lower leg: Edema (1+) present.     Left lower leg: Edema (1+) present.  Lymphadenopathy:     Cervical: No  cervical adenopathy.     Upper Body:     Right upper body: No supraclavicular, axillary or pectoral adenopathy.     Left upper body: No supraclavicular, axillary or pectoral adenopathy.  Skin:    General: Skin is warm and dry.  Neurological:     General: No focal deficit present.     Mental Status: She is alert and oriented to person, place, and time.  Psychiatric:        Mood and Affect: Mood normal.        Behavior: Behavior normal.     LABORATORY DATA:  I have reviewed the labs as listed.  CBC Latest Ref Rng & Units 10/15/2021 10/14/2020 03/28/2020  WBC 4.0 - 10.5 K/uL 10.5 7.1 7.1  Hemoglobin 12.0 - 15.0 g/dL 14.7 13.8 13.0  Hematocrit 36.0 - 46.0 % 43.5 41.8 40.1  Platelets 150 - 400  K/uL 224 282 215   CMP Latest Ref Rng & Units 10/15/2021 08/08/2021 10/14/2020  Glucose 70 - 99 mg/dL 150(H) 97 99  BUN 8 - 23 mg/dL 13 10 12   Creatinine 0.44 - 1.00 mg/dL 0.95 0.86 0.72  Sodium 135 - 145 mmol/L 137 140 134(L)  Potassium 3.5 - 5.1 mmol/L 3.9 4.1 3.9  Chloride 98 - 111 mmol/L 103 101 100  CO2 22 - 32 mmol/L 26 26 27   Calcium 8.9 - 10.3 mg/dL 8.0(L) 8.4(L) 8.7(L)  Total Protein 6.5 - 8.1 g/dL 6.1(L) - 6.9  Total Bilirubin 0.3 - 1.2 mg/dL 0.4 - 0.7  Alkaline Phos 38 - 126 U/L 69 - 73  AST 15 - 41 U/L 29 - 37  ALT 0 - 44 U/L 26 - 29    DIAGNOSTIC IMAGING:  I have independently reviewed the scans and discussed with the patient. DG Lumbar Spine Complete  Result Date: 10/15/2021 CLINICAL DATA:  Low back pain. EXAM: LUMBAR SPINE - COMPLETE 4+ VIEW COMPARISON:  Lumbar spine x-ray 06/30/2017. FINDINGS: L4, L scratched at L3, L4, L5 posterior fusion hardware appears uncomplicated and stable. There is stable 6 mm of retrolisthesis at L4-L5. Spinal alignment is otherwise within normal limits. There is severe disc space narrowing and endplate osteophyte formation with sclerosis at L5-S1 compatible with degenerative change, similar to the prior study. There are mild degenerative changes at  L2-L3 with disc space narrowing and endplate osteophyte formation which have progressed compared to 2018. No acute fractures are identified. IMPRESSION: 1. L3-L5 posterior fusion appears uncomplicated and unchanged. 2. Progression of mild degenerative changes at L2-L3. 3. Stable severe degenerative changes at L5-S1. Electronically Signed   By: Ronney Asters M.D.   On: 10/15/2021 20:14   MM DIAG BREAST TOMO UNI LEFT  Result Date: 10/21/2021 CLINICAL DATA:  Screening recall for possible left breast asymmetry. History of right mastectomy and prior benign left biopsy of calcifications 2014 (with resultant large post biopsy hematoma). EXAM: DIGITAL DIAGNOSTIC UNILATERAL LEFT MAMMOGRAM WITH TOMOSYNTHESIS AND CAD TECHNIQUE: Left digital diagnostic mammography and breast tomosynthesis was performed. The images were evaluated with computer-aided detection. COMPARISON:  Previous exams. ACR Breast Density Category b: There are scattered areas of fibroglandular density. FINDINGS: Additional tomograms were performed of the left breast. The initially questioned possible left breast asymmetry is felt to resolve with post biopsy changes/scarring in the left breast felt to be stable in appearance. There is no mammographic evidence of malignancy. IMPRESSION: No mammographic evidence of malignancy in the left breast. RECOMMENDATION: Recommend annual screening mammography. I have discussed the findings and recommendations with the patient. If applicable, a reminder letter will be sent to the patient regarding the next appointment. BI-RADS CATEGORY  1: Negative. Electronically Signed   By: Everlean Alstrom M.D.   On: 10/21/2021 09:18  MM 3D SCREEN BREAST UNI LEFT  Result Date: 10/15/2021 CLINICAL DATA:  Screening. EXAM: DIGITAL SCREENING UNILATERAL LEFT MAMMOGRAM WITH CAD AND TOMOSYNTHESIS TECHNIQUE: Left screening digital craniocaudal and mediolateral oblique mammograms were obtained. Left screening digital breast tomosynthesis  was performed. The images were evaluated with computer-aided detection. COMPARISON:  Previous exam(s). ACR Breast Density Category b: There are scattered areas of fibroglandular density. FINDINGS: In the left breast, a possible asymmetry warrants further evaluation. IMPRESSION: Further evaluation is suggested for possible asymmetry in the left breast. RECOMMENDATION: Diagnostic mammogram and possibly ultrasound of the left breast. (Code:[id number]-L-18M) The patient will be contacted regarding the findings, and additional imaging will be scheduled. BI-RADS CATEGORY  0:  Incomplete. Need additional imaging evaluation and/or prior mammograms for comparison. Electronically Signed   By: Abelardo Diesel M.D.   On: 10/15/2021 11:49     ASSESSMENT & PLAN: 1.  Right breast DCIS/LCIS: - Diagnosed in December 2015 due to abnormal screening mammogram - Status post right simple mastectomy on 11/20/2014, lymph node biopsy negative.  Grade 2, 10 cm, ER/PR positive. -Tamoxifen started on 01/01/2015.  She completed 5 years.  She stopped the tamoxifen on 01/19/2020. - Most recent unilateral left breast screening mammogram (10/15/2021) suggested possible asymmetry in the left breast.  Diagnostic mammogram (10/21/2021) did not show any mammographic evidence of malignancy in either breast, and the initially question possible left breast asymmetry was felt to resolve with postbiopsy changes/scarring in the left breast felt to be stable in appearance. - Most recent labs (10/15/2021): CBC unremarkable, CMP at baseline.  LDH mildly elevated at 200.  Normal folate, vitamin B12, vitamin D. - Physical exam did not reveal any discrete nodules or masses in left breast or right chest wall.  No axillary, supraclavicular, or pectoral lymphadenopathy. - PLAN: Repeat annual labs, mammogram, and office visit with physical exam in 1 year.  2.  Right renal cell carcinoma: - She had a T1 a NX RCC which was treated with partial right nephrectomy  (01/08/2016). - Last CT scan on 10/01/2017 showed no recurrence.  Hepatic steatosis was seen. - January 2021 she had repeat CT scans which was reportedly normal.  She was released by Sara [name] (urologist)  3.  Vitamin D deficiency: - Labs done on 10/15/2021 show a vitamin D level of 56.20. - She is taking 50,000 units weekly of vitamin D. - PLAN: Continue vitamin D 50,000 units weekly.     4.  Other symptoms - Patient describes generalized pain, encouraged to follow-up with her PCP - Patient describes intermittent burning chest pain, encouraged to follow-up with PCP and cardiologist - Patient describes worsening dyspnea and orthopnea, encouraged to follow-up with her cardiologist (vital signs stable, in no acute distress today)   PLAN SUMMARY & DISPOSITION: Labs and mammogram in 1 year Office visit after labs/mammogram  All questions were answered. The patient knows to call the clinic with any problems, questions or concerns.  Medical decision making: Moderate  Time spent on visit: I spent 20 minutes counseling the patient face to face. The total time spent in the appointment was 30 minutes and more than 50% was on counseling.   Harriett Rush, [name]  11/05/2021 10:27 AM

## 2021-11-05 ENCOUNTER — Other Ambulatory Visit: Payer: Self-pay

## 2021-11-05 ENCOUNTER — Encounter (HOSPITAL_COMMUNITY): Payer: Self-pay | Admitting: Physician Assistant

## 2021-11-05 ENCOUNTER — Inpatient Hospital Stay (HOSPITAL_COMMUNITY): Payer: Medicare Other | Attending: Hematology | Admitting: Physician Assistant

## 2021-11-05 VITALS — BP 105/69 | HR 66 | Temp 98.0°F | Resp 21 | Ht 66.0 in | Wt 237.8 lb

## 2021-11-05 DIAGNOSIS — Z79899 Other long term (current) drug therapy: Secondary | ICD-10-CM | POA: Diagnosis not present

## 2021-11-05 DIAGNOSIS — Z7982 Long term (current) use of aspirin: Secondary | ICD-10-CM | POA: Insufficient documentation

## 2021-11-05 DIAGNOSIS — R079 Chest pain, unspecified: Secondary | ICD-10-CM | POA: Insufficient documentation

## 2021-11-05 DIAGNOSIS — Z86000 Personal history of in-situ neoplasm of breast: Secondary | ICD-10-CM | POA: Insufficient documentation

## 2021-11-05 DIAGNOSIS — Z9011 Acquired absence of right breast and nipple: Secondary | ICD-10-CM | POA: Insufficient documentation

## 2021-11-05 DIAGNOSIS — Z1231 Encounter for screening mammogram for malignant neoplasm of breast: Secondary | ICD-10-CM | POA: Diagnosis not present

## 2021-11-05 DIAGNOSIS — Z87891 Personal history of nicotine dependence: Secondary | ICD-10-CM | POA: Insufficient documentation

## 2021-11-05 DIAGNOSIS — C50411 Malignant neoplasm of upper-outer quadrant of right female breast: Secondary | ICD-10-CM

## 2021-11-05 DIAGNOSIS — C50412 Malignant neoplasm of upper-outer quadrant of left female breast: Secondary | ICD-10-CM

## 2021-11-05 DIAGNOSIS — Z85528 Personal history of other malignant neoplasm of kidney: Secondary | ICD-10-CM | POA: Insufficient documentation

## 2021-11-05 DIAGNOSIS — Z905 Acquired absence of kidney: Secondary | ICD-10-CM | POA: Diagnosis not present

## 2021-11-05 DIAGNOSIS — E559 Vitamin D deficiency, unspecified: Secondary | ICD-10-CM | POA: Diagnosis not present

## 2021-11-05 NOTE — Patient Instructions (Signed)
[place] at [facility] Discharge Instructions  You were seen today by Tarri Abernethy PA-C for your history of breast cancer.  You do not currently have any signs of recurrent breast cancer.  We will check your labs and mammogram again in 1 year and we will see you for an office visit after those tests have been completed.  Please continue to follow-up with your primary care provider regarding your chronic pain and other symptoms.  Make sure that you call your cardiologist to discuss with them your symptoms of possible fluid overload.  LABS: Return in 1 year for repeat labs  OTHER TESTS: Mammogram in 1 year  MEDICATIONS: Continue vitamin D 50,000 units weekly.  FOLLOW-UP APPOINTMENT: Office visit in 1 year, after labs and mammogram.   Thank you for choosing [place] at [facility] to provide your oncology and hematology care.  To afford each patient quality time with our provider, please arrive at least 15 minutes before your scheduled appointment time.   If you have a lab appointment with the [facility] please come in thru the Main Entrance and check in at the main information desk.  You need to re-schedule your appointment should you arrive 10 or more minutes late.  We strive to give you quality time with our providers, and arriving late affects you and other patients whose appointments are after yours.  Also, if you no show three or more times for appointments you may be dismissed from the clinic at the providers discretion.     Again, thank you for choosing [facility].  Our hope is that these requests will decrease the amount of time that you wait before being seen by our physicians.       _____________________________________________________________  Should you have questions after your visit to [facility], please contact our office at [phone] and follow the prompts.  Our office hours are 8:00  a.m. and 4:30 p.m. Monday - Friday.  Please note that voicemails left after 4:00 p.m. may not be returned until the following business day.  We are closed weekends and major holidays.  You do have access to a nurse 24-7, just call the main number to the clinic [phone] and do not press any options, hold on the line and a nurse will answer the phone.    For prescription refill requests, have your pharmacy contact our office and allow 72 hours.    Due to Covid, you will need to wear a mask upon entering the hospital. If you do not have a mask, a mask will be given to you at the Main Entrance upon arrival. For doctor visits, patients may have 1 support person age 35 or older with them. For treatment visits, patients can not have anyone with them due to social distancing guidelines and our immunocompromised population.

## 2021-11-09 DIAGNOSIS — R0602 Shortness of breath: Secondary | ICD-10-CM

## 2021-11-09 DIAGNOSIS — R600 Localized edema: Secondary | ICD-10-CM

## 2021-11-24 DIAGNOSIS — Z0001 Encounter for general adult medical examination with abnormal findings: Secondary | ICD-10-CM | POA: Diagnosis not present

## 2021-11-24 DIAGNOSIS — E039 Hypothyroidism, unspecified: Secondary | ICD-10-CM | POA: Diagnosis not present

## 2021-12-04 ENCOUNTER — Other Ambulatory Visit: Payer: Self-pay

## 2021-12-04 MED ORDER — METOPROLOL TARTRATE 25 MG PO TABS: 25.0000 mg | ORAL_TABLET | Freq: Two times a day (BID) | ORAL | 3 refills | Status: DC

## 2021-12-10 ENCOUNTER — Ambulatory Visit (HOSPITAL_COMMUNITY)
Admission: RE | Admit: 2021-12-10 | Discharge: 2021-12-10 | Disposition: A | Discharge status: Home / Self Care | Payer: Medicare Other | Source: Ambulatory Visit | Attending: Nurse Practitioner | Admitting: Nurse Practitioner

## 2021-12-10 DIAGNOSIS — R0602 Shortness of breath: Secondary | ICD-10-CM | POA: Insufficient documentation

## 2021-12-10 DIAGNOSIS — R600 Localized edema: Secondary | ICD-10-CM | POA: Diagnosis not present

## 2021-12-10 LAB — ECHOCARDIOGRAM COMPLETE
Area-P 1/2: 2.83 cm2
S' Lateral: 2.5 cm

## 2021-12-10 NOTE — Progress Notes (Signed)
*  PRELIMINARY RESULTS* ?Echocardiogram ?2D Echocardiogram has been performed. ? ?Sara [name] ?12/10/2021, 12:19 PM ?

## 2021-12-12 ENCOUNTER — Encounter: Payer: Self-pay | Admitting: Cardiovascular Disease

## 2021-12-12 ENCOUNTER — Ambulatory Visit: Payer: Medicare Other | Admitting: Cardiovascular Disease

## 2021-12-12 ENCOUNTER — Other Ambulatory Visit: Payer: Self-pay

## 2021-12-12 DIAGNOSIS — R0683 Snoring: Secondary | ICD-10-CM

## 2021-12-12 DIAGNOSIS — G478 Other sleep disorders: Secondary | ICD-10-CM | POA: Diagnosis not present

## 2021-12-12 DIAGNOSIS — Z79899 Other long term (current) drug therapy: Secondary | ICD-10-CM | POA: Diagnosis not present

## 2021-12-12 DIAGNOSIS — I10 Essential (primary) hypertension: Secondary | ICD-10-CM | POA: Diagnosis not present

## 2021-12-12 DIAGNOSIS — Z853 Personal history of malignant neoplasm of breast: Secondary | ICD-10-CM | POA: Diagnosis not present

## 2021-12-12 DIAGNOSIS — E785 Hyperlipidemia, unspecified: Secondary | ICD-10-CM

## 2021-12-12 DIAGNOSIS — R600 Localized edema: Secondary | ICD-10-CM | POA: Diagnosis not present

## 2021-12-12 DIAGNOSIS — I471 Supraventricular tachycardia: Secondary | ICD-10-CM

## 2021-12-12 DIAGNOSIS — R400 Somnolence: Secondary | ICD-10-CM

## 2021-12-12 MED ORDER — TORSEMIDE 40 MG PO TABS: 40.0000 mg | ORAL_TABLET | Freq: Every day | ORAL | 6 refills | Status: DC

## 2021-12-12 NOTE — Patient Instructions (Signed)
Medication Instructions:  ?STOP FUROSEMIDE (LASIX) ? ?START TORSEMIDE '40MG'$  DAILY ? ?*If you need a refill on your cardiac medications before your next appointment, please call your pharmacy* ? ?Lab Work: ?CMET AND BNP IN 2 WEEKS-THIS IS NOT FASTING ?If you have labs (blood work) drawn today and your tests are completely normal, you will receive your results only by:  [place] (if you have MyChart) OR A paper copy in the mail.  If you have any lab test that is abnormal or we need to change your treatment, we will call you to review the results. You may go to any [facility] that is convenient for you however, we do have a lab in our office that is able to assist you. You DO NOT need an appointment for our lab. The lab is open 8:00am and closes at 4:00pm. Lunch 12:45 - 1:45pm. ? ?Testing/Procedures: ?Your physician has recommended that you have a sleep study. This test records several body functions during sleep, including: brain activity, eye movement, oxygen and carbon dioxide blood levels, heart rate and rhythm, breathing rate and rhythm, the flow of air through your mouth and nose, snoring, body muscle movements, and chest and belly movement.  SOMEONE WILL BE CALLING YOU TO SCHEDULE THIS STUDY. ? ?Follow-Up: ?Your next appointment:  3 month(s) In Person with Shelva Majestic, MD   ? ?At [facility], you and your health needs are our priority.  As part of our continuing mission to provide you with exceptional heart care, we have created designated Provider Care Teams.  These Care Teams include your primary Cardiologist (physician) and Advanced Practice Providers (APPs -  Physician Assistants and Nurse Practitioners) who all work together to provide you with the care you need, when you need it. ? ?We recommend signing up for the patient portal called "MyChart".  Sign up information is provided on this After Visit Summary.  MyChart is used to connect with patients for Virtual Visits (Telemedicine).  Patients are  able to view lab/test results, encounter notes, upcoming appointments, etc.  Non-urgent messages can be sent to your provider as well.   ?To learn more about what you can do with MyChart, go to [email or web address].   ? ? ? ? ?

## 2021-12-12 NOTE — Progress Notes (Signed)
? ?Cardiology Office Note   ? ?Date:  12/22/2021  ? ?ID:  Sara [name], DOB [birth year], MRN [id number] ? ?PCP:  [name], MD  ?Cardiologist:  Shelva Majestic, MD  ? ?3 month  follow-up cardiology office visit  ? ?History of Present Illness:  ?Sara [name] is a 76 y.o. female who had remotely seen Dr. Johnsie Cancel in 2014.  I saw her for initial evaluation with me on June 14, 2020 and last saw her on September 12, 2021.  She presents for a  follow-up evaluation  ? ?Sara [name] has a history of hypertension, hyperlipidemia, GERD, hypothyroidism, breast and renal cell CA and is status post right mastectomy.  She has a history of mild obstructive sleep apnea not on therapy and has experienced episodes of atypical chest pain.  She was admitted to the hospital in June 2021 with constant left lateral upper pectoral infra chest pain inferior to her shoulder.  Her pain was constant and at times waxed and waned in intensity.  It was not exertionally precipitated.  She presented to the emergency room.  Her EKG was negative for ischemic change.  Troponins were negative.  Laboratory was notable for hypokalemia with potassium of 2.9.  Her ECG shows sinus rhythm with left axis deviation without ectopy or ST-T changes.  D-dimer was upper normal.  It was felt that her chest pain was noncardiac in etiology.  I had seen her for consultation in the hospital and recommended initial noninvasive assessment with coronary CTA I recommended discontinuance of hydrochlorothiazide.  An echo Doppler study on March 05, 2020 showed an EF of 65% without wall motion abnormalities.  There was grade 1 diastolic dysfunction.  There were normal pulmonary pressures.  There was no significant valvular abnormality.  A coronary CTA showed a calcium score at 60, representing 74th percentile for age and sex matched control.  There was mild coronary plaque in the LAD of 0 to 24%.  There was heavy calcification in the RCA creating a potential blooming  artifact.  FFR analysis was normal. ? ?Sara [name] subsequently saw a Rosaria Ferries in the office for follow-up evaluation. ? ?I saw her for my initial evaluation in the office with me on June 14, 2020.  At that time, she was experiencing occasional  left-sided chest wall tenderness.  She denied any exertional chest pain.  She developed Covid in June/July 2020.  Subsequently she did not get vaccinated.  At times she had mild swelling in her legs and has been wearing support stockings.  She sees Dr. [name] for primary care.  During that evaluation, she had costochondral tenderness which mimicked her chest pain consistent with musculoskeletal etiology. ? ?I saw her in July 17, 2021.  She has continued to be evaluated by Dr. Delton Coombes at [facility] oncology center.  She denies any exertional chest pain.  At times she notes a rare sensation of a pulling sensation across her chest which is nonexertional.  She does experience some mild shortness of breath with walking.  She has noted some mild ankle swelling.  She is on a regimen consisting of carvedilol 3.125 mg twice a day, and HCTZ 25 mg daily.  She is no longer on amlodipine for hypertension.  She is on levothyroxine 88 mcg for hypothyroidism, takes omeprazole for GERD, and sertraline.  During that evaluation, with her exertional dyspnea I recommended she undergo an echo Doppler study.  Due to her ankle swelling HCTZ was discontinued and she was started on  furosemide 20 mg daily.  Lipid studies in September 2021 showed an LDL cholesterol of 69 on atorvastatin 20 mg. ? ?She represented to our office on August 07, 2021 and was seen by Christen Bame, NP.  Her lower extremity edema had not improved since switching to furosemide and also admitted to experiencing orthopnea, PND and palpitations without syncope.  She was avoiding sodium intake.  Compression stockings were recommended.  She was instructed to double her furosemide dose for 3 days with  supplemental potassium.  Because of palpitations a 14-day monitor was recommended. ? ?She wore a 14-day monitor which showed predominant sinus rhythm with an average heart rate of 73 bpm.  The slowest heart rate was sinus bradycardia 53 bpm the fastest heart rate was 174 bpm which was a 4 beat episode of SVT.  She had 1 run of nonsustained ventricular tachycardia lasting 4 beats with a maximum rate at 156 bpm (average 134) and there were a total of 17 short episodes of SVT with the longest interval being 20 beats at an average rate at 128 bpm.  There were isolated PACs and atrial couplets and rare isolated PVC. ? ?I last saw her in follow-up on September 12, 2021.  At that time she was still experiencing some occasional palpitations and had lower extremity edema.  She has been taking the furosemide 20 mg daily after having taken 3 days of the higher dose.  She continues to be on carvedilol 3.125 mg twice a day, levothyroxine 88 mcg and is on atorvastatin 20 mg for hyperlipidemia.  During his brief episodes of SVT in 1 run of nonsustained VT lasting 4 beats I recommended discontinuance of carvedilol and initiated metoprolol tartrate 25 mg twice a day with potential need for further titration.  With continued edema I recommended further titration of furosemide to 40 mg.   ? ?Since I last saw her she denies any chest pain.  She continues to experience swelling despite taking Lasix which she has been taking at 60 mg daily..  Her palpitations have improved she continues to be on metoprolol tartrate 25 mg twice a day.  She is on levothyroxine 100 mcg for hypothyroidism.  She is on atorvastatin 20 mg for hyperlipidemia.  She admits that she does not sleep well.  She is aware of snoring, daytime sleepiness, fatigue, and her sleep is nonrestorative.  She presents for evaluation. ? ? ?Past Medical History:  ?Diagnosis Date  ? Allergic rhinitis due to pollen   ? Anxiety   ? Arthritis   ? "knees, back, right elbow; left  shoulder; right ankle" (11/20/2014)  ? Breast cancer, right breast ([place]) 2016  ? "DCIS; zero stage" S/P mastectomy, cancer free since then  ? Cervical spondylosis without myelopathy   ? Cervicalgia   ? Chronic lower back pain   ? Constipation   ? takes Miralax daily  ? Degeneration of cervical intervertebral disc   ? Depression   ? Diverticulosis of colon (without mention of hemorrhage)   ? GERD (gastroesophageal reflux disease)   ? takes Omeprazole daily  ? Graves' disease   ? "took a pill to correct"  ? H/O hiatal hernia   ? H/O urinary frequency   ? Headache(784.0)   ? denies migraines since the 80's but has occ and takes Butalbital prn  ? HTN (hypertension)   ? takes Metoprolol and Lisinopril daily  ? Insomnia   ? takes Ativan nigtly  ? Joint pain   ? "all of them"  ?  Joint swelling   ? "knees, legs, ankles sometimes" (11/20/2014)  ? Morbid obesity ([place])   ? Numbness   ? LOWER LEGS  ? Other postablative hypothyroidism   ? takes SYnthroid daily  ? Palpitations   ? Peptic ulcer, unspecified site, unspecified as acute or chronic, without mention of hemorrhage, perforation, or obstruction   ? Primary localized osteoarthrosis, lower leg   ? Pure hypercholesterolemia   ? takes Pravastin daily  ? Recurrent UTI   ? Renal cell carcinoma ([place]) 01/08/2016  ? Right renal mass s/p partial right nephrectomy, cancer free since 2017  ? Shortness of breath   ? Sleep apnea   ? slight but doesn't require a CPAP (11/20/2014)  ? Tendonitis of knee   ? bilateral  ? ? ?Past Surgical History:  ?Procedure Laterality Date  ? ABDOMINAL HYSTERECTOMY    ? ANTERIOR LAT LUMBAR FUSION  05/13/2012  ? Procedure: ANTERIOR LATERAL LUMBAR FUSION 2 LEVELS;  Surgeon: Faythe Ghee, MD;  Location: [facility];  Service: Neurosurgery;  Laterality: Left;  Left Lumbar Three-four,Lumbar four-five Extreme Lumbar Interbody Fusion with Percutaneous Pedicle Screws  ? APPENDECTOMY    ? BREAST BIOPSY Left ~ 2014  ? BREAST BIOPSY Right 2015  ? BREAST LUMPECTOMY Left  1988  ? BREAST RECONSTRUCTION WITH PLACEMENT OF TISSUE EXPANDER AND FLEX HD (ACELLULAR HYDRATED DERMIS) Right 11/20/2014  ? BREAST RECONSTRUCTION WITH PLACEMENT OF TISSUE EXPANDER AND FLEX HD (ACELLULAR HYDRATED DERMIS

## 2021-12-17 ENCOUNTER — Telehealth: Payer: Self-pay | Admitting: *Deleted

## 2021-12-17 NOTE — Telephone Encounter (Signed)
Patient notified of sleep study appointment details. ?

## 2021-12-22 ENCOUNTER — Encounter: Payer: Self-pay | Admitting: Cardiovascular Disease

## 2021-12-23 ENCOUNTER — Other Ambulatory Visit: Payer: Self-pay

## 2021-12-23 MED ORDER — TORSEMIDE 20 MG PO TABS: 40.0000 mg | ORAL_TABLET | Freq: Every day | ORAL | 2 refills | Status: DC

## 2021-12-26 DIAGNOSIS — R400 Somnolence: Secondary | ICD-10-CM | POA: Diagnosis not present

## 2021-12-26 DIAGNOSIS — I10 Essential (primary) hypertension: Secondary | ICD-10-CM | POA: Diagnosis not present

## 2021-12-26 DIAGNOSIS — Z79899 Other long term (current) drug therapy: Secondary | ICD-10-CM | POA: Diagnosis not present

## 2021-12-26 DIAGNOSIS — R0683 Snoring: Secondary | ICD-10-CM | POA: Diagnosis not present

## 2021-12-26 DIAGNOSIS — G478 Other sleep disorders: Secondary | ICD-10-CM | POA: Diagnosis not present

## 2021-12-29 LAB — COMPREHENSIVE METABOLIC PANEL
ALT: 32 IU/L (ref 0–32)
AST: 46 IU/L — ABNORMAL HIGH (ref 0–40)
Albumin/Globulin Ratio: 1.4 (ref 1.2–2.2)
Albumin: 3.3 g/dL — ABNORMAL LOW (ref 3.7–4.7)
Alkaline Phosphatase: 88 IU/L (ref 44–121)
BUN/Creatinine Ratio: 14 (ref 12–28)
BUN: 15 mg/dL (ref 8–27)
Bilirubin Total: 0.4 mg/dL (ref 0.0–1.2)
CO2: 28 mmol/L (ref 20–29)
Calcium: 8.7 mg/dL (ref 8.7–10.3)
Chloride: 99 mmol/L (ref 96–106)
Creatinine, Ser: 1.04 mg/dL — ABNORMAL HIGH (ref 0.57–1.00)
Globulin, Total: 2.4 g/dL (ref 1.5–4.5)
Glucose: 106 mg/dL — ABNORMAL HIGH (ref 70–99)
Potassium: 3.9 mmol/L (ref 3.5–5.2)
Sodium: 141 mmol/L (ref 134–144)
Total Protein: 5.7 g/dL — ABNORMAL LOW (ref 6.0–8.5)
eGFR: 56 mL/min/{1.73_m2} — ABNORMAL LOW (ref >59)

## 2021-12-29 LAB — BRAIN NATRIURETIC PEPTIDE: BNP: 19.4 pg/mL (ref 0.0–100.0)

## 2022-01-01 ENCOUNTER — Other Ambulatory Visit: Payer: Self-pay | Admitting: Cardiovascular Disease

## 2022-01-02 ENCOUNTER — Encounter: Payer: Self-pay | Admitting: Cardiovascular Disease

## 2022-01-11 ENCOUNTER — Other Ambulatory Visit: Payer: Self-pay | Admitting: Orthopedic Surgery

## 2022-01-19 DIAGNOSIS — I10 Essential (primary) hypertension: Secondary | ICD-10-CM | POA: Diagnosis not present

## 2022-01-19 DIAGNOSIS — N3281 Overactive bladder: Secondary | ICD-10-CM | POA: Diagnosis not present

## 2022-01-19 DIAGNOSIS — R002 Palpitations: Secondary | ICD-10-CM | POA: Diagnosis not present

## 2022-01-19 DIAGNOSIS — E039 Hypothyroidism, unspecified: Secondary | ICD-10-CM | POA: Diagnosis not present

## 2022-01-22 ENCOUNTER — Other Ambulatory Visit: Payer: Self-pay | Admitting: Family Medicine

## 2022-01-22 ENCOUNTER — Ambulatory Visit (INDEPENDENT_AMBULATORY_CARE_PROVIDER_SITE_OTHER): Payer: Medicare Other

## 2022-01-22 DIAGNOSIS — R002 Palpitations: Secondary | ICD-10-CM

## 2022-01-24 DIAGNOSIS — R002 Palpitations: Secondary | ICD-10-CM | POA: Diagnosis not present

## 2022-01-26 ENCOUNTER — Ambulatory Visit: Payer: Medicare Other | Attending: Cardiovascular Disease | Admitting: Cardiovascular Disease

## 2022-01-26 DIAGNOSIS — G4733 Obstructive sleep apnea (adult) (pediatric): Secondary | ICD-10-CM | POA: Insufficient documentation

## 2022-01-26 DIAGNOSIS — R400 Somnolence: Secondary | ICD-10-CM | POA: Diagnosis not present

## 2022-01-26 DIAGNOSIS — G478 Other sleep disorders: Secondary | ICD-10-CM | POA: Diagnosis not present

## 2022-01-26 DIAGNOSIS — R0683 Snoring: Secondary | ICD-10-CM | POA: Diagnosis not present

## 2022-01-30 ENCOUNTER — Other Ambulatory Visit: Payer: Self-pay | Admitting: Orthopedic Surgery

## 2022-01-30 ENCOUNTER — Encounter: Payer: Medicare Other | Admitting: Cardiovascular Disease

## 2022-01-30 DIAGNOSIS — Z96652 Presence of left artificial knee joint: Secondary | ICD-10-CM

## 2022-01-30 DIAGNOSIS — Z96651 Presence of right artificial knee joint: Secondary | ICD-10-CM

## 2022-01-30 MED ORDER — PREDNISONE 10 MG PO TABS: ORAL_TABLET | ORAL | 0 refills | Status: DC

## 2022-02-02 ENCOUNTER — Encounter: Payer: Self-pay | Admitting: Cardiovascular Disease

## 2022-02-02 NOTE — Procedures (Signed)
? ? ?                            [place]     ? ? ? ?Patient Name: Sara [name], Sara [name] ?Study Date: 01/26/2022 ?Gender: Female ?D.O.B: [birth year] ?Age (years): 82 ?Referring Provider: Shelva Majestic MD, ABSM ?Height (inches): 66 ?Interpreting Physician: Shelva Majestic MD, ABSM ?Weight (lbs): 226 ?RPSGT: Rosebud Poles ?BMI: 36 ?MRN: [id number] ?Neck Size: 15.50 ? ?CLINICAL INFORMATION ?Sleep Study Type: NPSG ? ?Indication for sleep study: Snoring, non-restorative sleep, daytime sleepiness, fatigue ? ?Epworth Sleepiness Score: 12 ? ?SLEEP STUDY TECHNIQUE ?As per the AASM Manual for the Scoring of Sleep and Associated Events v2.3 (April 2016) with a hypopnea requiring 4% desaturations. ? ?The channels recorded and monitored were frontal, central and occipital EEG, electrooculogram (EOG), submentalis EMG (chin), nasal and oral airflow, thoracic and abdominal wall motion, anterior tibialis EMG, snore microphone, electrocardiogram, and pulse oximetry. ? ?MEDICATIONS ?acetaminophen (TYLENOL) 500 MG tablet ?aspirin EC 81 MG tablet ?atorvastatin (LIPITOR) 20 MG tablet (Expired) ?diphenhydrAMINE (BENADRYL) 50 MG capsule ?ergocalciferol (VITAMIN D2) 1.25 MG (50000 UT) capsule ?gabapentin (NEURONTIN) 600 MG tablet ?levothyroxine (SYNTHROID) 100 MCG tablet ?metoprolol tartrate (LOPRESSOR) 25 MG tablet ?omeprazole (PRILOSEC) 20 MG capsule ?oxybutynin (DITROPAN) 5 MG tablet ?potassium chloride (KLOR-CON) 10 MEQ tablet ?predniSONE (DELTASONE) 10 MG tablet ?sertraline (ZOLOFT) 50 MG tablet ?torsemide (DEMADEX) 20 MG tablet ?Medications self-administered by patient taken the night of the study : N/A ? ?SLEEP ARCHITECTURE ?The study was initiated at 11:22:07 PM and ended at 5:43:14 AM. ? ?Sleep onset time was 8.7 minutes and the sleep efficiency was 83.8%. The total sleep time was 319.5 minutes. ? ?Stage REM latency was 102.5 minutes. ? ?The patient spent 10.02% of the night in stage N1 sleep, 68.23% in stage N2 sleep, 0.00% in stage  N3 and 21.8% in REM. ? ?Alpha intrusion was absent. ? ?Supine sleep was 25.51%. ? ?RESPIRATORY PARAMETERS ?The overall apnea/hypopnea index (AHI) was 15.2 per hour. The respiratory disturbance index (RDI) was 15.2/h. There were 5 total apneas, including 4 obstructive, 1 central and 0 mixed apneas. There were 76 hypopneas and 0 RERAs. ? ?The AHI during Stage REM sleep was 38.8 per hour. ? ?AHI while supine was 42.0 per hour. ? ?The mean oxygen saturation was 90.59%. The minimum SpO2 during sleep was 78.00%. ? ?Moderate snoring was noted during this study. ? ?CARDIAC DATA ?The 2 lead EKG demonstrated sinus rhythm. The mean heart rate was 65.25 beats per minute. Other EKG findings include: None. ? ?LEG MOVEMENT DATA ?The total PLMS were 94 with a resulting PLMS index of 17.65. Associated arousal with leg movement index was 1.9 . ? ?IMPRESSIONS ?- Moderate obstructive sleep apnea overall (AHI 15.2/h; RDI 15.2/h); however sleep apnea was severe during REM sleep (AHI 38.8/h) and with supine sleep (AHI 42.0/h). ?- No significant central sleep apnea occurred during this study (CAI 0.2/h). ?- Significant oxygen desaturation to a nadir of 78%. ?- The patient snored with moderate snoring volume. ?- No cardiac abnormalities were noted during this study. ?- Mild periodic limb movements of sleep occurred during the study. No significant associated arousals. ? ?DIAGNOSIS ?- Obstructive Sleep Apnea (G47.33) ?- Nocturnal Hypoxemia (G47.36) ? ?RECOMMENDATIONS ?- Therapeutic CPAP titration to determine optimal pressure required to alleviate sleep disordered breathing. If unable to obtain an in-lab titration, initiate Auto-PAP with EPR of 3 at 7 - 18 cm of water. ?- Effort should be made to optimize nasal  and orophayngeal patency. ?- Positional therapy avoiding supine position during sleep. ?- Avoid alcohol, sedatives and other CNS depressants that may worsen sleep apnea and disrupt normal sleep architecture. ?- Sleep hygiene should be  reviewed to assess factors that may improve sleep quality. ?- Weight management (BMI 36) and regular exercise should be initiated or continued if appropriate. ? ?[Electronically signed] 02/02/2022 09:24 AM ? ?Shelva Majestic MD, [facility], ABSM ?Diplomate, Tax adviser of Sleep Medicine ? ?NPI: [id number] ? ?[place] ?PH: [phone]   FX: [phone] ?ACCREDITED BY THE AMERICAN ACADEMY OF SLEEP MEDICINE ? ?

## 2022-02-10 ENCOUNTER — Other Ambulatory Visit: Payer: Self-pay | Admitting: Cardiovascular Disease

## 2022-02-10 DIAGNOSIS — G4733 Obstructive sleep apnea (adult) (pediatric): Secondary | ICD-10-CM

## 2022-02-10 DIAGNOSIS — G4736 Sleep related hypoventilation in conditions classified elsewhere: Secondary | ICD-10-CM

## 2022-02-11 ENCOUNTER — Telehealth: Payer: Self-pay | Admitting: *Deleted

## 2022-02-11 ENCOUNTER — Ambulatory Visit: Payer: Medicare Other | Attending: Cardiovascular Disease | Admitting: Cardiovascular Disease

## 2022-02-11 ENCOUNTER — Other Ambulatory Visit: Payer: Self-pay | Admitting: Cardiovascular Disease

## 2022-02-11 DIAGNOSIS — G4733 Obstructive sleep apnea (adult) (pediatric): Secondary | ICD-10-CM | POA: Insufficient documentation

## 2022-02-11 DIAGNOSIS — G4736 Sleep related hypoventilation in conditions classified elsewhere: Secondary | ICD-10-CM | POA: Insufficient documentation

## 2022-02-11 DIAGNOSIS — R002 Palpitations: Secondary | ICD-10-CM | POA: Diagnosis not present

## 2022-02-11 NOTE — Telephone Encounter (Signed)
-----   Message from Troy Sine, MD sent at 02/02/2022  9:29 AM EDT ----- Mariann Laster, please notify pt and try for CPAP titration or Auto-PAP

## 2022-02-11 NOTE — Telephone Encounter (Signed)
Patient notified of Sleep study results. She agrees to proceed with CPAP titration.

## 2022-02-22 ENCOUNTER — Encounter: Payer: Self-pay | Admitting: Cardiovascular Disease

## 2022-02-22 NOTE — Procedures (Signed)
[facility]         Patient Name: Sara [name], Sara [name] Date: 02/11/2022 Gender: Female D.O.B: 1946/01/30 Age (years): 9 Referring Provider: Shelva Majestic MD, ABSM Height (inches): 66 Interpreting Physician: Shelva Majestic MD, ABSM Weight (lbs): 229 RPSGT: Rosebud Poles BMI: 37 MRN: [id number] Neck Size: 15.50  CLINICAL INFORMATION The patient is referred for a CPAP titration to treat sleep apnea.  Date of NPSG:  01/26/2022: AHI: 15.2/h; REM AHI 38.8/h: supine AHI 42.0/h; O2 nadir 78%.  SLEEP STUDY TECHNIQUE As per the AASM Manual for the Scoring of Sleep and Associated Events v2.3 (April 2016) with a hypopnea requiring 4% desaturations.  The channels recorded and monitored were frontal, central and occipital EEG, electrooculogram (EOG), submentalis EMG (chin), nasal and oral airflow, thoracic and abdominal wall motion, anterior tibialis EMG, snore microphone, electrocardiogram, and pulse oximetry. Continuous positive airway pressure (CPAP) was initiated at the beginning of the study and titrated to treat sleep-disordered breathing.  MEDICATIONS acetaminophen (TYLENOL) 500 MG tablet aspirin EC 81 MG tablet atorvastatin (LIPITOR) 20 MG tablet (Expired) diphenhydrAMINE (BENADRYL) 50 MG capsule ergocalciferol (VITAMIN D2) 1.25 MG (50000 UT) capsule gabapentin (NEURONTIN) 600 MG tablet levothyroxine (SYNTHROID) 100 MCG tablet metoprolol tartrate (LOPRESSOR) 25 MG tablet omeprazole (PRILOSEC) 20 MG capsule oxybutynin (DITROPAN) 5 MG tablet potassium chloride (KLOR-CON) 10 MEQ tablet predniSONE (DELTASONE) 10 MG tablet sertraline (ZOLOFT) 50 MG tablet torsemide (DEMADEX) 20 MG tablet Medications self-administered by patient taken the night of the study : N/A  TECHNICIAN COMMENTS Comments added by technician: CPAP therapy started at 4 cm of H2O. Titration increased to 8 CWP with an EPR of 2. Patient tolerated CPAP pressure fairly well.  Central-like events noted to increase in final pressure of 8 CWP at times in supine position. EPR of 2 was applied, but not seen in supine position Comments added by scorer: N/A  RESPIRATORY PARAMETERS Optimal PAP Pressure (cm): 8 AHI at Optimal Pressure (/hr): 2.8 Overall Minimal O2 (%): 84.00 Supine % at Optimal Pressure (%): 62 Minimal O2 at Optimal Pressure (%): 87.0   SLEEP ARCHITECTURE The study was initiated at 10:10:01 PM and ended at 6:03:21 AM.  Sleep onset time was 53.8 minutes and the sleep efficiency was 80.9%. The total sleep time was 383 minutes.  The patient spent 4.44% of the night in stage N1 sleep, 59.27% in stage N2 sleep, 16.45% in stage N3 and 19.8% in REM.Stage REM latency was 168.5 minutes  Wake after sleep onset was 36.6. Alpha intrusion was absent. Supine sleep was 51.78%.  CARDIAC DATA The 2 lead EKG demonstrated sinus rhythm. The mean heart rate was 61.79 beats per minute. Other EKG findings include: None.  LEG MOVEMENT DATA The total Periodic Limb Movements of Sleep (PLMS) were 0. The PLMS index was 0.00. A PLMS index of <15 is considered normal in adults.  IMPRESSIONS - CPAP was initiated at 4 cm and was titrated to 8 cm of water (AHI 2.1/h; O2 nadir 87%) - Central sleep apnea was not noted during this titration (CAI = 1.9/h). - Moderate oxygen desaturations to a nadir of 84%. - The patient snored with soft snoring volume during this titration study. - No cardiac abnormalities were observed during this study. - Clinically significant periodic limb movements were not noted during this study. Arousals associated with PLMs were rare.  DIAGNOSIS -  Obstructive Sleep Apnea (G47.33)  RECOMMENDATIONS - Recommend an initial trial of CPAP Auto therapy with EPR of 3 at 8 -13 cm H2O with heated humidification. A medium size Resmed Nasal Airfit N20 mask was used for the titration. - Effort should be made to otpimize nasal and oropharyngeal patency. - Avoid  alcohol, sedatives and other CNS depressants that may worsen sleep apnea and disrupt normal sleep architecture. - Sleep hygiene should be reviewed to assess factors that may improve sleep quality. - Weight management and regular exercise should be initiated or continued. - Recommend a download and sleep clinic evaluation after 4 weeks of therapy.  [Electronically signed] 02/22/2022 07:32 PM  Shelva Majestic MD, [facility] [place], American Board of Sleep Medicine  NPI: [id number]  [place] PH: [phone]   FX: [phone] [place]

## 2022-02-23 DIAGNOSIS — E039 Hypothyroidism, unspecified: Secondary | ICD-10-CM | POA: Diagnosis not present

## 2022-02-23 DIAGNOSIS — R1310 Dysphagia, unspecified: Secondary | ICD-10-CM | POA: Diagnosis not present

## 2022-02-23 DIAGNOSIS — I10 Essential (primary) hypertension: Secondary | ICD-10-CM | POA: Diagnosis not present

## 2022-02-23 DIAGNOSIS — N3281 Overactive bladder: Secondary | ICD-10-CM | POA: Diagnosis not present

## 2022-02-23 DIAGNOSIS — M7731 Calcaneal spur, right foot: Secondary | ICD-10-CM | POA: Diagnosis not present

## 2022-02-23 DIAGNOSIS — G4733 Obstructive sleep apnea (adult) (pediatric): Secondary | ICD-10-CM | POA: Diagnosis not present

## 2022-03-03 ENCOUNTER — Telehealth: Payer: Self-pay | Admitting: *Deleted

## 2022-03-03 NOTE — Telephone Encounter (Signed)
Mychart message sent to patient informing her CPAP machine order has been sent to [facility].

## 2022-03-04 ENCOUNTER — Encounter: Payer: Medicare Other | Admitting: Cardiovascular Disease

## 2022-03-06 ENCOUNTER — Other Ambulatory Visit (HOSPITAL_COMMUNITY): Payer: Self-pay | Admitting: Family Medicine

## 2022-03-06 ENCOUNTER — Ambulatory Visit (HOSPITAL_COMMUNITY)
Admission: RE | Admit: 2022-03-06 | Discharge: 2022-03-06 | Disposition: A | Discharge status: Home / Self Care | Payer: Medicare Other | Source: Ambulatory Visit | Attending: Family Medicine | Admitting: Family Medicine

## 2022-03-06 DIAGNOSIS — M79672 Pain in left foot: Secondary | ICD-10-CM | POA: Diagnosis not present

## 2022-03-09 ENCOUNTER — Other Ambulatory Visit: Payer: Self-pay | Admitting: Cardiovascular Disease

## 2022-03-11 ENCOUNTER — Ambulatory Visit: Payer: Medicare Other | Admitting: General Practice

## 2022-04-03 NOTE — Progress Notes (Unsigned)
Cardiology Clinic Note   Patient Name: SISSY GOETZKE Date of Encounter: 04/06/2022  Primary Care Provider:  [name], MD Primary Cardiologist:  Shelva Majestic, MD  Patient Profile    [name] 76 year old female presents the clinic today for follow-up evaluation of her essential hypertension, palpitations, and hyperlipidemia.  Past Medical History    Past Medical History:  Diagnosis Date   Allergic rhinitis due to pollen    Anxiety    Arthritis    "knees, back, right elbow; left shoulder; right ankle" (11/20/2014)   Breast cancer, right breast ([place]) 2016   "DCIS; zero stage" S/P mastectomy, cancer free since then   Cervical spondylosis without myelopathy    Cervicalgia    Chronic lower back pain    Constipation    takes Miralax daily   Degeneration of cervical intervertebral disc    Depression    Diverticulosis of colon (without mention of hemorrhage)    GERD (gastroesophageal reflux disease)    takes Omeprazole daily   Graves' disease    "took a pill to correct"   H/O hiatal hernia    H/O urinary frequency    Headache(784.0)    denies migraines since the 80's but has occ and takes Butalbital prn   HTN (hypertension)    takes Metoprolol and Lisinopril daily   Insomnia    takes Ativan nigtly   Joint pain    "all of them"   Joint swelling    "knees, legs, ankles sometimes" (11/20/2014)   Morbid obesity ([place])    Numbness    LOWER LEGS   Other postablative hypothyroidism    takes SYnthroid daily   Palpitations    Peptic ulcer, unspecified site, unspecified as acute or chronic, without mention of hemorrhage, perforation, or obstruction    Primary localized osteoarthrosis, lower leg    Pure hypercholesterolemia    takes Pravastin daily   Recurrent UTI    Renal cell carcinoma ([place]) 01/08/2016   Right renal mass s/p partial right nephrectomy, cancer free since 2017   Shortness of breath    Sleep apnea    slight but doesn't require a CPAP (11/20/2014)    Tendonitis of knee    bilateral   Past Surgical History:  Procedure Laterality Date   ABDOMINAL HYSTERECTOMY     ANTERIOR LAT LUMBAR FUSION  05/13/2012   Procedure: ANTERIOR LATERAL LUMBAR FUSION 2 LEVELS;  Surgeon: Faythe Ghee, MD;  Location: [facility];  Service: Neurosurgery;  Laterality: Left;  Left Lumbar Three-four,Lumbar four-five Extreme Lumbar Interbody Fusion with Percutaneous Pedicle Screws   APPENDECTOMY     BREAST BIOPSY Left ~ 2014   BREAST BIOPSY Right 2015   BREAST LUMPECTOMY Left 1988   BREAST RECONSTRUCTION WITH PLACEMENT OF TISSUE EXPANDER AND FLEX HD (ACELLULAR HYDRATED DERMIS) Right 11/20/2014   BREAST RECONSTRUCTION WITH PLACEMENT OF TISSUE EXPANDER AND FLEX HD (ACELLULAR HYDRATED DERMIS) Right 11/20/2014   Procedure: RIGHT BREAST RECONSTRUCTION WITH PLACEMENT OF TISSUE EXPANDER AND ACELLULAR DERMA MATRIX (ACELLULAR DERMA MATRIX);  Surgeon: Crissie Reese, MD;  Location: [facility];  Service: Plastics;  Laterality: Right;   CATARACT EXTRACTION W/ INTRAOCULAR LENS  IMPLANT, BILATERAL Bilateral    COLONOSCOPY  Sept 2008   RMR: normal rectum, left-sided diverticula, repeat in 2018   COLONOSCOPY WITH PROPOFOL N/A 03/21/2018   Dr. Gala Romney: diverticulosis, internal grade I hemorrhoids. no future colonoscopies unless develops new symptoms   DIAGNOSTIC LAPAROSCOPY     DILATION AND CURETTAGE OF UTERUS  ESOPHAGOGASTRODUODENOSCOPY     ESOPHAGOGASTRODUODENOSCOPY  2013   Dr. Gala Romney: Cervical esophageal web and Schatzki's ring s/p dilation, small hiatal hernia, antral and duodenal erosions likely NSAID effect, chronic duodenitis on path    ESOPHAGOGASTRODUODENOSCOPY (EGD) WITH PROPOFOL N/A 03/21/2018   Dr. Gala Romney: mild Schatzki ring s/p dilation   FOREIGN BODY REMOVAL Right 10/06/2013   Procedure: REMOVAL FOREIGN BODY EXTREMITY;  Surgeon: Carole Civil, MD;  [facility];  Service: Orthopedics;  Laterality: Right;   INJECTION KNEE Left 10/06/2013   Procedure: KNEE INJECTION;   Surgeon: Carole Civil, MD;  Location: [facility];  Service: Orthopedics;  Laterality: Left;   JOINT REPLACEMENT Bilateral    knees   KNEE ARTHROSCOPY Right    KNEE ARTHROSCOPY WITH LATERAL MENISECTOMY Right 10/06/2013   Procedure: KNEE ARTHROSCOPY WITH LATERAL AND MEDIAL MENISECTOMY;  Surgeon: Carole Civil, MD;  Location: [facility];  Service: Orthopedics;  Laterality: Right;  END @ 1234   lumpectomy on pelvis     "mass of nerves"   [place] N/A 03/21/2018   Procedure: MALONEY DILATION;  Surgeon: Daneil Dolin, MD;  [facility];  Service: Endoscopy;  Laterality: N/A;   MASTECTOMY COMPLETE / SIMPLE W/ SENTINEL NODE BIOPSY Right 11/20/2014   axillary   REMOVAL OF TISSUE EXPANDER AND PLACEMENT OF IMPLANT Right 12/13/2014   Procedure: REMOVAL OF RIGHT BREAST TISSUE EXPANDER;  Surgeon: Crissie Reese, MD;  Location: [facility];  Service: Plastics;  Laterality: Right;   ROBOTIC ASSITED PARTIAL NEPHRECTOMY Right 01/08/2016   Procedure: XI ROBOTIC ASSITED PARTIAL NEPHRECTOMY;  Surgeon: Alexis Frock, MD;  Location: [facility];  Service: Urology;  Laterality: Right;   SIMPLE MASTECTOMY WITH AXILLARY SENTINEL NODE BIOPSY Right 11/20/2014   Procedure: SIMPLE MASTECTOMY WITH AXILLARY SENTINEL NODE BIOPSY;  Surgeon: Erroll Luna, MD;  Location: [facility];  Service: General;  Laterality: Right;   TISSUE EXPANDER REMOVAL Right 12/13/2014   dr Harlow Mares   TONSILLECTOMY     TOTAL KNEE ARTHROPLASTY Right 01/24/2014   Procedure: TOTAL KNEE ARTHROPLASTY;  Surgeon: Carole Civil, MD;  [facility];  Service: Orthopedics;  Laterality: Right;   TOTAL KNEE ARTHROPLASTY Left 08/08/2014   Procedure: TOTAL KNEE ARTHROPLASTY;  Surgeon: Carole Civil, MD;  [facility];  Service: Orthopedics;  Laterality: Left;   TUBAL LIGATION     UPPER GASTROINTESTINAL ENDOSCOPY      Allergies  Allergies  Allergen Reactions   Propoxyphene Hcl Other (See Comments)     Hallucinations   Pollen Extract Other  (See Comments)    Sneezing, congestion   Camphor Itching   Dust Mite Extract Itching    Sneezing. Runny nose    Latex Dermatitis and Rash   Molds & Smuts Itching   Tomato Hives    History of Present Illness    Lyndall B Groseclose has a PMH of essential hypertension, GERD, dysphagia, liver lesion, osteoarthritis, patellar tendinitis, renal cell carcinoma, hyperlipidemia, morbid obesity, palpitations, back pain, hematochezia, right knee pain status post knee replacement 5/15, and 11/15, abnormal gait, and depression.  Her echocardiogram 07/31/2021 showed an LVEF of 02-77%, normal diastolic parameters, pericardial effusion posterior left ventricle and localized near right atrium, trivial mitral valve regurgitation, mild aortic valve sclerosis  She was seen in follow-up by Dr. Claiborne Billings on 12/12/2021.  During that time she denied chest discomfort.  She continued to have lower extremity swelling despite taking 40 mg daily.  Her palpitations were improved with metoprolol 25 mg twice daily.  She reported not sleeping well.  She subsequent sleep evaluation was recommended.  Sodium restriction was discussed.  Her furosemide was stopped and she was started on torsemide 40 mg daily.  She presents to the clinic today for follow-up evaluation states she continues to notice palpitations.  She also notices regular discomfort in her chest at the same time as her palpitations.  We reviewed her cardiac event monitor.  She and her husband expressed understanding.  We also reviewed her echocardiogram.  She reports that she does consume about 12 ounces of coffee in the morning.  She also drinks 2 bottles of water and enjoys juice and ginger ale.  She continues to wait on her CPAP.  She expects it to arrive on Wednesday.  Her blood pressures at home range in the 130/80s and in the office today her blood pressure is 120/73.  It appears that her chest discomfort sensation is related to her episodes of extra beats.  I will  increase her metoprolol to 37.5 mg twice daily, have her increase her p.o. hydration slightly, increase her physical activity as tolerated, and plan follow-up 1 to 2 months.  Today she denies  melena,  hemoptysis, diaphoresis,  presyncope, syncope, orthopnea, and PND.     Home Medications    Prior to Admission medications   Medication Sig Start Date End Date Taking? Authorizing Provider  acetaminophen (TYLENOL) 500 MG tablet Take 650 mg by mouth in the morning and at bedtime.    [provider]  aspirin EC 81 MG tablet Take 81 mg by mouth daily. Swallow whole.    [provider]  atorvastatin (LIPITOR) 20 MG tablet Take 1 tablet (20 mg total) by mouth daily. 03/07/20 11/05/21  Georgette Shell, MD  diphenhydrAMINE (BENADRYL) 50 MG capsule Take 50 mg by mouth at bedtime.    [provider]  ergocalciferol (VITAMIN D2) 1.25 MG (50000 UT) capsule Take 1 capsule (50,000 Units total) by mouth once a week. 08/06/21   Derek Jack, MD  gabapentin (NEURONTIN) 600 MG tablet TAKE 1 TABLET BY MOUTH  TWICE DAILY 01/12/22   Carole Civil, MD  levothyroxine (SYNTHROID) 100 MCG tablet Take 100 mcg by mouth daily. 11/12/21   [provider]  metoprolol tartrate (LOPRESSOR) 25 MG tablet Take 1 tablet (25 mg total) by mouth 2 (two) times daily. 12/04/21 03/04/22  Troy Sine, MD  omeprazole (PRILOSEC) 20 MG capsule Take 20 mg by mouth daily. May take a second 20 mg dose as needed for heartburn    [provider]  oxybutynin (DITROPAN) 5 MG tablet Take 5 mg by mouth 2 (two) times daily.  07/31/15   [provider]  potassium chloride (KLOR-CON) 10 MEQ tablet TAKE 1 TABLET BY MOUTH  DAILY 01/01/22   Troy Sine, MD  predniSONE (DELTASONE) 10 MG tablet D.S. 12 days dose pack. 01/30/22   Carole Civil, MD  sertraline (ZOLOFT) 50 MG tablet TAKE 1 TABLET BY MOUTH  DAILY 08/26/21   Derek Jack, MD  torsemide (DEMADEX) 20 MG tablet  TAKE 2 TABLETS BY MOUTH DAILY 03/09/22   Troy Sine, MD  Ranitidine HCl (ZANTAC 75 PO) Take by mouth.    12/01/11  [provider]    Family History    Family History  Problem Relation Age of Onset   Stroke Sister    Heart attack Sister        In her 41s   Osteoarthritis Sister    Heart  attack Sister        In her 104s   Osteoarthritis Brother    Obesity Brother    Heart attack Sister        In her 68s   Heart attack Father        He died at age 52   Lung cancer Maternal Grandmother        non-smoker   Colon cancer Neg Hx        Mother died at 66 from blood clot, MI   She indicated that her mother is deceased. She indicated that her father is deceased. She indicated that all of her three sisters are alive. She indicated that both of her brothers are alive. She indicated that her maternal grandmother is deceased. She indicated that her maternal grandfather is deceased. She indicated that her paternal grandmother is deceased. She indicated that her paternal grandfather is deceased. She indicated that her maternal aunt is deceased. She indicated that her maternal uncle is deceased. She indicated that her paternal uncle is alive. She indicated that the status of her neg hx is unknown.  Social History    Social History   Socioeconomic History   Marital status: Married    Spouse name: Milbert Coulter   Number of children: 4   Years of education: Not on file   Highest education level: Not on file  Occupational History   Occupation: disability    Employer: UNEMPLOYED    Comment: since 2008, knee surgery  Tobacco Use   Smoking status: Former    Packs/day: 1.00    Years: 27.00    Total pack years: 27.00    Types: Cigarettes    Quit date: 12/26/1985    Years since quitting: 36.3   Smokeless tobacco: Never   Tobacco comments:    Quit in 1987  Substance and Sexual Activity   Alcohol use: Yes    Alcohol/week: 0.0 standard drinks of alcohol    Comment: occasional wine   Drug  use: No   Sexual activity: Yes    Birth control/protection: Surgical  Other Topics Concern   Not on file  Social History Narrative   Currently married for 10 years. Husband's name is Milbert Coulter. She is an excellent singer. 8 children between she and her husband. 23 grand children. 12 great grandchildren.   Social Determinants of Health   Financial Resource Strain: Not on file  Food Insecurity: Not on file  Transportation Needs: Not on file  Physical Activity: Not on file  Stress: Not on file  Social Connections: Not on file  Intimate Partner Violence: Not on file     Review of Systems    General:  No chills, fever, night sweats or weight changes.  Cardiovascular:  No chest pain, dyspnea on exertion, edema, orthopnea, palpitations, paroxysmal nocturnal dyspnea. Dermatological: No rash, lesions/masses Respiratory: No cough, dyspnea Urologic: No hematuria, dysuria Abdominal:   No nausea, vomiting, diarrhea, bright red blood per rectum, melena, or hematemesis Neurologic:  No visual changes, wkns, changes in mental status. All other systems reviewed and are otherwise negative except as noted above.  Physical Exam    VS:  BP 120/73   Pulse (!) 56   Ht '5\' 6"'$  (1.676 m)   Wt 223 lb 9.6 oz (101.4 kg)   SpO2 97%   BMI 36.09 kg/m  , BMI Body mass index is 36.09 kg/m. GEN: Well nourished, well developed, in no acute distress. HEENT: normal. Neck: Supple, no JVD, carotid bruits, or masses.  Cardiac: RRR, no murmurs, rubs, or gallops. No clubbing, cyanosis, generalized bilateral lower extremity nonpitting right greater than left edema.  Radials/DP/PT 2+ and equal bilaterally.  Respiratory:  Respirations regular and unlabored, clear to auscultation bilaterally. GI: Soft, nontender, nondistended, BS + x 4. MS: no deformity or atrophy. Skin: warm and dry, no rash. Neuro:  Strength and sensation are intact. Psych: Normal affect.  Accessory Clinical Findings    Recent Labs: 10/15/2021:  Hemoglobin 14.7; Platelets 224 12/26/2021: ALT 32; BNP 19.4; BUN 15; Creatinine, Ser 1.04; Potassium 3.9; Sodium 141   Recent Lipid Panel    Component Value Date/Time   CHOL 141 06/07/2020 0833   TRIG 71 06/07/2020 0833   HDL 57 06/07/2020 0833   CHOLHDL 2.5 06/07/2020 0833   VLDL 14 03/06/2020 0420   LDLCALC 69 06/07/2020 0833    ECG personally reviewed by me today-none today.  Echocardiogram 12/10/2021  IMPRESSIONS     1. Left ventricular ejection fraction, by estimation, is 55 to 60%. The  left ventricle has normal function. The left ventricle has no regional  wall motion abnormalities. Left ventricular diastolic parameters are  consistent with Grade I diastolic  dysfunction (impaired relaxation).   2. Right ventricular systolic function is normal. The right ventricular  size is normal. There is normal pulmonary artery systolic pressure. The  estimated right ventricular systolic pressure is 00.9 mmHg.   3. A small pericardial effusion is present. The pericardial effusion is  posterior to the left ventricle. Also trivial anterior collection. Appears  similar to prior study in extent and distribution.   4. The mitral valve is grossly normal. Trivial mitral valve  regurgitation.   5. The aortic valve is tricuspid. Aortic valve regurgitation is not  visualized.   6. The inferior vena cava is normal in size with greater than 50%  respiratory variability, suggesting right atrial pressure of 3 mmHg.   Comparison(s): No significant change from prior study. Prior images  reviewed side by side.   Assessment & Plan   1.  Bilateral lower extremity edema-generalized bilateral lower extremity nonpitting right greater than left edema.  Weight improved from 236 pounds to 223 pounds.  Lab work stable for 723. Continue torsemide Heart healthy low-sodium diet-salty 6 given Increase physical activity as tolerated  Essential hypertension-BP today 120/73.  Well-controlled at  home. Continue metoprolol Heart healthy low-sodium diet-salty 6 given Increase physical activity as tolerated   SVT-continues to have palpitations regularly.  Cardiac event monitor showed predominantly sinus rhythm with short runs of SVT (56) and 2 episode of VT. Increase metoprolol 37.5 mg twice daily Heart healthy low-sodium diet Increase physical activity as tolerated Avoid triggers-slightly increased p.o. hydration  Hyperlipidemia-LDL 79 on 10/13/21 Continue ASA Heart healthy low-sodium high-fiber diet Increase physical activity as tolerated   OSA-waiting on [facility], coming in on Wednesday  Heart healthy low-sodium diet Increase physical activity as tolerated Avoid sedatives before bed, alcohol Follows with Dr. Claiborne Billings  Disposition: Follow-up with Dr. Claiborne Billings in 6 months.   Jossie Ng. Latoyna Hird NP-C     04/06/2022, 12:30 PM [facility] [place] [address] Office [phone] Fax [phone]  Notice: This dictation was prepared with Dragon dictation along with smaller phrase technology. Any transcriptional errors that result from this process are unintentional and may not be corrected upon review.  I spent 15 minutes examining this patient, reviewing medications, and using patient centered shared decision making involving her cardiac care.  Prior to her visit I spent greater  than 20 minutes reviewing her past medical history,  medications, and prior cardiac tests.

## 2022-04-06 ENCOUNTER — Encounter: Payer: Self-pay | Admitting: General Practice

## 2022-04-06 ENCOUNTER — Ambulatory Visit: Payer: Medicare Other | Admitting: General Practice

## 2022-04-06 VITALS — BP 120/73 | HR 56 | Ht 66.0 in | Wt 223.6 lb

## 2022-04-06 DIAGNOSIS — I10 Essential (primary) hypertension: Secondary | ICD-10-CM

## 2022-04-06 DIAGNOSIS — G4733 Obstructive sleep apnea (adult) (pediatric): Secondary | ICD-10-CM

## 2022-04-06 DIAGNOSIS — R600 Localized edema: Secondary | ICD-10-CM

## 2022-04-06 DIAGNOSIS — E785 Hyperlipidemia, unspecified: Secondary | ICD-10-CM | POA: Diagnosis not present

## 2022-04-06 DIAGNOSIS — I471 Supraventricular tachycardia: Secondary | ICD-10-CM | POA: Diagnosis not present

## 2022-04-06 MED ORDER — METOPROLOL TARTRATE 25 MG PO TABS: 37.5000 mg | ORAL_TABLET | Freq: Two times a day (BID) | ORAL | 2 refills | Status: DC

## 2022-04-06 NOTE — Patient Instructions (Signed)
Medication Instructions:  INCREASE METOPROLOL TART 37.5 MG TWICE DAILY (1-1/2 TAB TWICE DAILY)  *If you need a refill on your cardiac medications before your next appointment, please call your pharmacy*  Lab Work:   Testing/Procedures:  NONE    NONE  If you have labs (blood work) drawn today and your tests are completely normal, you will receive your results only by: [place] (if you have MyChart) OR  A paper copy in the mail If you have any lab test that is abnormal or we need to change your treatment, we will call you to review the results.  Special Instructions PLEASE INCREASE PHYSICAL ACTIVITY AS TOLERATED   STOP COFFEE MAY USE DECAFFEINATED IF NEEDED  SLIGHTLY INCREASE HYDRATION  Follow-Up: Your next appointment:  1-2 month(s) In Person with  Coletta Memos, FNP-C  Please call our office 2 months in advance to schedule this appointment  :1  At [facility], you and your health needs are our priority.  As part of our continuing mission to provide you with exceptional heart care, we have created designated Provider Care Teams.  These Care Teams include your primary Cardiologist (physician) and Advanced Practice Providers (APPs -  Physician Assistants and Nurse Practitioners) who all work together to provide you with the care you need, when you need it.  Important Information About Sugar

## 2022-04-08 DIAGNOSIS — G4733 Obstructive sleep apnea (adult) (pediatric): Secondary | ICD-10-CM | POA: Diagnosis not present

## 2022-04-20 DIAGNOSIS — I10 Essential (primary) hypertension: Secondary | ICD-10-CM | POA: Diagnosis not present

## 2022-04-20 DIAGNOSIS — E039 Hypothyroidism, unspecified: Secondary | ICD-10-CM | POA: Diagnosis not present

## 2022-04-20 DIAGNOSIS — M79672 Pain in left foot: Secondary | ICD-10-CM | POA: Diagnosis not present

## 2022-04-20 DIAGNOSIS — G4733 Obstructive sleep apnea (adult) (pediatric): Secondary | ICD-10-CM | POA: Diagnosis not present

## 2022-04-20 DIAGNOSIS — R600 Localized edema: Secondary | ICD-10-CM | POA: Diagnosis not present

## 2022-04-20 DIAGNOSIS — N3281 Overactive bladder: Secondary | ICD-10-CM | POA: Diagnosis not present

## 2022-05-01 ENCOUNTER — Ambulatory Visit: Payer: Medicare Other | Admitting: Podiatry

## 2022-05-01 DIAGNOSIS — M722 Plantar fascial fibromatosis: Secondary | ICD-10-CM

## 2022-05-05 ENCOUNTER — Other Ambulatory Visit: Payer: Self-pay | Admitting: General Practice

## 2022-05-06 NOTE — Progress Notes (Unsigned)
Cardiology Clinic Note   Patient Name: Sara [name] Date of Encounter: 05/07/2022  Primary Care Provider:  [name], MD Primary Cardiologist:  Shelva Majestic, MD  Patient Profile    Sara [name] 76 year old female presents the clinic today for follow-up evaluation of her essential hypertension, palpitations, and hyperlipidemia.  Past Medical History    Past Medical History:  Diagnosis Date   Allergic rhinitis due to pollen    Anxiety    Arthritis    "knees, back, right elbow; left shoulder; right ankle" (11/20/2014)   Breast cancer, right breast ([place]) 2016   "DCIS; zero stage" S/P mastectomy, cancer free since then   Cervical spondylosis without myelopathy    Cervicalgia    Chronic lower back pain    Constipation    takes Miralax daily   Degeneration of cervical intervertebral disc    Depression    Diverticulosis of colon (without mention of hemorrhage)    GERD (gastroesophageal reflux disease)    takes Omeprazole daily   Graves' disease    "took a pill to correct"   H/O hiatal hernia    H/O urinary frequency    Headache(784.0)    denies migraines since the 80's but has occ and takes Butalbital prn   HTN (hypertension)    takes Metoprolol and Lisinopril daily   Insomnia    takes Ativan nigtly   Joint pain    "all of them"   Joint swelling    "knees, legs, ankles sometimes" (11/20/2014)   Morbid obesity ([place])    Numbness    LOWER LEGS   Other postablative hypothyroidism    takes SYnthroid daily   Palpitations    Peptic ulcer, unspecified site, unspecified as acute or chronic, without mention of hemorrhage, perforation, or obstruction    Primary localized osteoarthrosis, lower leg    Pure hypercholesterolemia    takes Pravastin daily   Recurrent UTI    Renal cell carcinoma ([place]) 01/08/2016   Right renal mass s/p partial right nephrectomy, cancer free since 2017   Shortness of breath    Sleep apnea    slight but doesn't require a CPAP (11/20/2014)    Tendonitis of knee    bilateral   Past Surgical History:  Procedure Laterality Date   ABDOMINAL HYSTERECTOMY     ANTERIOR LAT LUMBAR FUSION  05/13/2012   Procedure: ANTERIOR LATERAL LUMBAR FUSION 2 LEVELS;  Surgeon: Faythe Ghee, MD;  Location: [place] [facility];  Service: Neurosurgery;  Laterality: Left;  Left Lumbar Three-four,Lumbar four-five Extreme Lumbar Interbody Fusion with Percutaneous Pedicle Screws   APPENDECTOMY     BREAST BIOPSY Left ~ 2014   BREAST BIOPSY Right 2015   BREAST LUMPECTOMY Left 1988   BREAST RECONSTRUCTION WITH PLACEMENT OF TISSUE EXPANDER AND FLEX HD (ACELLULAR HYDRATED DERMIS) Right 11/20/2014   BREAST RECONSTRUCTION WITH PLACEMENT OF TISSUE EXPANDER AND FLEX HD (ACELLULAR HYDRATED DERMIS) Right 11/20/2014   Procedure: RIGHT BREAST RECONSTRUCTION WITH PLACEMENT OF TISSUE EXPANDER AND ACELLULAR DERMA MATRIX (ACELLULAR DERMA MATRIX);  Surgeon: Crissie Reese, MD;  Location: [place];  Service: Plastics;  Laterality: Right;   CATARACT EXTRACTION W/ INTRAOCULAR LENS  IMPLANT, BILATERAL Bilateral    COLONOSCOPY  Sept 2008   RMR: normal rectum, left-sided diverticula, repeat in 2018   COLONOSCOPY WITH PROPOFOL N/A 03/21/2018   Dr. Gala Romney: diverticulosis, internal grade I hemorrhoids. no future colonoscopies unless develops new symptoms   DIAGNOSTIC LAPAROSCOPY     DILATION AND CURETTAGE OF UTERUS  ESOPHAGOGASTRODUODENOSCOPY     ESOPHAGOGASTRODUODENOSCOPY  2013   Dr. Gala Romney: Cervical esophageal web and Schatzki's ring s/p dilation, small hiatal hernia, antral and duodenal erosions likely NSAID effect, chronic duodenitis on path    ESOPHAGOGASTRODUODENOSCOPY (EGD) WITH PROPOFOL N/A 03/21/2018   Dr. Gala Romney: mild Schatzki ring s/p dilation   FOREIGN BODY REMOVAL Right 10/06/2013   Procedure: REMOVAL FOREIGN BODY EXTREMITY;  Surgeon: Carole Civil, MD;  Location: [facility];  Service: Orthopedics;  Laterality: Right;   INJECTION KNEE Left 10/06/2013   Procedure: KNEE INJECTION;   Surgeon: Carole Civil, MD;  Location: [facility];  Service: Orthopedics;  Laterality: Left;   JOINT REPLACEMENT Bilateral    knees   KNEE ARTHROSCOPY Right    KNEE ARTHROSCOPY WITH LATERAL MENISECTOMY Right 10/06/2013   Procedure: KNEE ARTHROSCOPY WITH LATERAL AND MEDIAL MENISECTOMY;  Surgeon: Carole Civil, MD;  Location: [facility];  Service: Orthopedics;  Laterality: Right;  END @ 1234   lumpectomy on pelvis     "mass of nerves"   [place] N/A 03/21/2018   Procedure: MALONEY DILATION;  Surgeon: Daneil Dolin, MD;  [facility];  Service: Endoscopy;  Laterality: N/A;   MASTECTOMY COMPLETE / SIMPLE W/ SENTINEL NODE BIOPSY Right 11/20/2014   axillary   REMOVAL OF TISSUE EXPANDER AND PLACEMENT OF IMPLANT Right 12/13/2014   Procedure: REMOVAL OF RIGHT BREAST TISSUE EXPANDER;  Surgeon: Crissie Reese, MD;  Location: [place];  Service: Plastics;  Laterality: Right;   ROBOTIC ASSITED PARTIAL NEPHRECTOMY Right 01/08/2016   Procedure: XI ROBOTIC ASSITED PARTIAL NEPHRECTOMY;  Surgeon: Alexis Frock, MD;  Location: [facility];  Service: Urology;  Laterality: Right;   SIMPLE MASTECTOMY WITH AXILLARY SENTINEL NODE BIOPSY Right 11/20/2014   Procedure: SIMPLE MASTECTOMY WITH AXILLARY SENTINEL NODE BIOPSY;  Surgeon: Erroll Luna, MD;  Location: [place];  Service: General;  Laterality: Right;   TISSUE EXPANDER REMOVAL Right 12/13/2014   dr Harlow Mares   TONSILLECTOMY     TOTAL KNEE ARTHROPLASTY Right 01/24/2014   Procedure: TOTAL KNEE ARTHROPLASTY;  Surgeon: Carole Civil, MD;  [facility];  Service: Orthopedics;  Laterality: Right;   TOTAL KNEE ARTHROPLASTY Left 08/08/2014   Procedure: TOTAL KNEE ARTHROPLASTY;  Surgeon: Carole Civil, MD;  [facility];  Service: Orthopedics;  Laterality: Left;   TUBAL LIGATION     UPPER GASTROINTESTINAL ENDOSCOPY      Allergies  Allergies  Allergen Reactions   Propoxyphene Hcl Other (See Comments)     Hallucinations   Pollen Extract Other  (See Comments)    Sneezing, congestion   Camphor Itching   Dust Mite Extract Itching    Sneezing. Runny nose    Latex Dermatitis and Rash   Molds & Smuts Itching   Tomato Hives    History of Present Illness    Sara [name] has a PMH of essential hypertension, GERD, dysphagia, liver lesion, osteoarthritis, patellar tendinitis, renal cell carcinoma, hyperlipidemia, morbid obesity, palpitations, back pain, hematochezia, right knee pain status post knee replacement 5/15, and 11/15, abnormal gait, and depression.  Her echocardiogram 07/31/2021 showed an LVEF of 30-86%, normal diastolic parameters, pericardial effusion posterior left ventricle and localized near right atrium, trivial mitral valve regurgitation, mild aortic valve sclerosis  She was seen in follow-up by Dr. Claiborne Billings on 12/12/2021.  During that time she denied chest discomfort.  She continued to have lower extremity swelling despite taking 40 mg daily.  Her palpitations were improved with metoprolol 25 mg twice daily.  She reported not sleeping well.  She subsequent sleep evaluation was recommended.  Sodium restriction was discussed.  Her furosemide was stopped and she was started on torsemide 40 mg daily.  She presented to the clinic 04/06/22 for follow-up evaluation stated she continued to notice palpitations.  She also noticed regular discomfort in her chest at the same time as her palpitations.  We reviewed her cardiac event monitor.  She and her husband expressed understanding.  We also reviewed her echocardiogram.  She reported that she did consume about 12 ounces of coffee in the morning.  She also drank 2 bottles of water and enjoyed juice and ginger ale.  She continued to wait on her CPAP.  She expected it to arrive on Wednesday.  Her blood pressures at home range in the 130/80s and in the office her blood pressure was 120/73.  It appeared that her chest discomfort sensation was related to her episodes of extra beats.  I  increased  her metoprolol to 37.5 mg twice daily, had her increase her p.o. hydration slightly, increased her physical activity as tolerated, and I planned for follow-up 1 to 2 months.  She presents to the clinic today for follow-up evaluation and states she feels well.  She reports compliance with her CPAP and is tolerating it fairly well.  She has not been noticing fewer palpitations with the increase in her metoprolol.  Her initial blood pressure in the office today is 88/70 and recheck is 100/64.  She has been maintaining better p.o. hydration.  I will reduce her metoprolol to 37 mg in the a.m. and have her reduce to 25 mg in the p.m.  We will have her keep her follow-up appointment with Dr. Claiborne Billings in October.  Today she denies chest pain, dizziness, shortness of breath, melena,  hemoptysis, diaphoresis,  presyncope, syncope, orthopnea, and PND.     Home Medications    Prior to Admission medications   Medication Sig Start Date End Date Taking? Authorizing Provider  acetaminophen (TYLENOL) 500 MG tablet Take 650 mg by mouth in the morning and at bedtime.    [provider]  aspirin EC 81 MG tablet Take 81 mg by mouth daily. Swallow whole.    [provider]  atorvastatin (LIPITOR) 20 MG tablet Take 1 tablet (20 mg total) by mouth daily. 03/07/20 11/05/21  Georgette Shell, MD  diphenhydrAMINE (BENADRYL) 50 MG capsule Take 50 mg by mouth at bedtime.    [provider]  ergocalciferol (VITAMIN D2) 1.25 MG (50000 UT) capsule Take 1 capsule (50,000 Units total) by mouth once a week. 08/06/21   Derek Jack, MD  gabapentin (NEURONTIN) 600 MG tablet TAKE 1 TABLET BY MOUTH  TWICE DAILY 01/12/22   Carole Civil, MD  levothyroxine (SYNTHROID) 100 MCG tablet Take 100 mcg by mouth daily. 11/12/21   [provider]  metoprolol tartrate (LOPRESSOR) 25 MG tablet Take 1 tablet (25 mg total) by mouth 2 (two) times daily. 12/04/21 03/04/22  Troy Sine, MD  omeprazole  (PRILOSEC) 20 MG capsule Take 20 mg by mouth daily. May take a second 20 mg dose as needed for heartburn    [provider]  oxybutynin (DITROPAN) 5 MG tablet Take 5 mg by mouth 2 (two) times daily.  07/31/15   [provider]  potassium chloride (KLOR-CON) 10 MEQ tablet TAKE 1 TABLET BY MOUTH  DAILY 01/01/22   Troy Sine, MD  predniSONE (DELTASONE) 10 MG tablet D.S. 12 days dose  pack. 01/30/22   Carole Civil, MD  sertraline (ZOLOFT) 50 MG tablet TAKE 1 TABLET BY MOUTH  DAILY 08/26/21   Derek Jack, MD  torsemide (DEMADEX) 20 MG tablet TAKE 2 TABLETS BY MOUTH DAILY 03/09/22   Troy Sine, MD  Ranitidine HCl (ZANTAC 75 PO) Take by mouth.    12/01/11  [provider]    Family History    Family History  Problem Relation Age of Onset   Stroke Sister    Heart attack Sister        In her 62s   Osteoarthritis Sister    Heart attack Sister        In her 75s   Osteoarthritis Brother    Obesity Brother    Heart attack Sister        In her 59s   Heart attack Father        He died at age 55   Lung cancer Maternal Grandmother        non-smoker   Colon cancer Neg Hx        Mother died at 89 from blood clot, MI   She indicated that her mother is deceased. She indicated that her father is deceased. She indicated that all of her three sisters are alive. She indicated that both of her brothers are alive. She indicated that her maternal grandmother is deceased. She indicated that her maternal grandfather is deceased. She indicated that her paternal grandmother is deceased. She indicated that her paternal grandfather is deceased. She indicated that her maternal aunt is deceased. She indicated that her maternal uncle is deceased. She indicated that her paternal uncle is alive. She indicated that the status of her neg hx is unknown.  Social History    Social History   Socioeconomic History   Marital status: Married    Spouse name: Milbert Coulter   Number of  children: 4   Years of education: Not on file   Highest education level: Not on file  Occupational History   Occupation: disability    Employer: UNEMPLOYED    Comment: since 2008, knee surgery  Tobacco Use   Smoking status: Former    Packs/day: 1.00    Years: 27.00    Total pack years: 27.00    Types: Cigarettes    Quit date: 12/26/1985    Years since quitting: 36.3   Smokeless tobacco: Never   Tobacco comments:    Quit in 1987  Substance and Sexual Activity   Alcohol use: Yes    Alcohol/week: 0.0 standard drinks of alcohol    Comment: occasional wine   Drug use: No   Sexual activity: Yes    Birth control/protection: Surgical  Other Topics Concern   Not on file  Social History Narrative   Currently married for 10 years. Husband's name is Milbert Coulter. She is an excellent singer. 8 children between she and her husband. 23 grand children. 12 great grandchildren.   Social Determinants of Health   Financial Resource Strain: Not on file  Food Insecurity: Not on file  Transportation Needs: Not on file  Physical Activity: Not on file  Stress: Not on file  Social Connections: Not on file  Intimate Partner Violence: Not on file     Review of Systems    General:  No chills, fever, night sweats or weight changes.  Cardiovascular:  No chest pain, dyspnea on exertion, edema, orthopnea, palpitations, paroxysmal nocturnal dyspnea. Dermatological: No rash, lesions/masses Respiratory: No cough, dyspnea Urologic: No  hematuria, dysuria Abdominal:   No nausea, vomiting, diarrhea, bright red blood per rectum, melena, or hematemesis Neurologic:  No visual changes, wkns, changes in mental status. All other systems reviewed and are otherwise negative except as noted above.  Physical Exam    VS:  BP 100/64   Pulse (!) 59   Ht '5\' 6"'$  (1.676 m)   Wt 221 lb (100.2 kg)   SpO2 96%   BMI 35.67 kg/m  , BMI Body mass index is 35.67 kg/m. GEN: Well nourished, well developed, in no acute  distress. HEENT: normal. Neck: Supple, no JVD, carotid bruits, or masses. Cardiac: RRR, no murmurs, rubs, or gallops. No clubbing, cyanosis, generalized bilateral lower extremity nonpitting right greater than left edema.  Radials/DP/PT 2+ and equal bilaterally.  Respiratory:  Respirations regular and unlabored, clear to auscultation bilaterally. GI: Soft, nontender, nondistended, BS + x 4. MS: no deformity or atrophy. Skin: warm and dry, no rash. Neuro:  Strength and sensation are intact. Psych: Normal affect.  Accessory Clinical Findings    Recent Labs: 10/15/2021: Hemoglobin 14.7; Platelets 224 12/26/2021: ALT 32; BNP 19.4; BUN 15; Creatinine, Ser 1.04; Potassium 3.9; Sodium 141   Recent Lipid Panel    Component Value Date/Time   CHOL 141 06/07/2020 0833   TRIG 71 06/07/2020 0833   HDL 57 06/07/2020 0833   CHOLHDL 2.5 06/07/2020 0833   VLDL 14 03/06/2020 0420   LDLCALC 69 06/07/2020 0833    ECG personally reviewed by me today-none today.  Echocardiogram 12/10/2021  IMPRESSIONS     1. Left ventricular ejection fraction, by estimation, is 55 to 60%. The  left ventricle has normal function. The left ventricle has no regional  wall motion abnormalities. Left ventricular diastolic parameters are  consistent with Grade I diastolic  dysfunction (impaired relaxation).   2. Right ventricular systolic function is normal. The right ventricular  size is normal. There is normal pulmonary artery systolic pressure. The  estimated right ventricular systolic pressure is 81.1 mmHg.   3. A small pericardial effusion is present. The pericardial effusion is  posterior to the left ventricle. Also trivial anterior collection. Appears  similar to prior study in extent and distribution.   4. The mitral valve is grossly normal. Trivial mitral valve  regurgitation.   5. The aortic valve is tricuspid. Aortic valve regurgitation is not  visualized.   6. The inferior vena cava is normal in size  with greater than 50%  respiratory variability, suggesting right atrial pressure of 3 mmHg.   Comparison(s): No significant change from prior study. Prior images  reviewed side by side.   Assessment & Plan   1.  Bilateral lower extremity edema-generalized bilateral lower extremity nonpitting right greater than left edema.  Weight improved from 236 pounds to 223 pounds.  Lab work stable for 723. Continue torsemide Heart healthy low-sodium diet-salty 6 reviewed Increase physical activity as tolerated  Essential hypertension-BP today 100/64.  Continues with good control at home.   Change metoprolol to 37.5 mg a.m. and 25 mg p.m. Heart healthy low-sodium diet Increase physical activity as tolerated   SVT-chronic stable.  Previous cardiac event monitor showed predominantly sinus rhythm with short runs of SVT (56) and 2 episode of VT. Continue metoprolol  Heart healthy low-sodium diet Increase physical activity as tolerated Avoid triggers-maintain p.o. hydration   [facility]-CPAP arrived and she was compliant with the device.  Waking up well rested. Heart healthy low-sodium diet Increase physical activity as tolerated Avoid sedatives before bed, alcohol Follows  with Dr. Claiborne Billings  Disposition: Follow-up with Dr. Claiborne Billings in 2 months.   Jossie Ng. Bunnie Rehberg NP-C     05/07/2022, 3:41 PM [facility] [place] [address] Office [phone] Fax [phone]  Notice: This dictation was prepared with Dragon dictation along with smaller phrase technology. Any transcriptional errors that result from this process are unintentional and may not be corrected upon review.  I spent 14 minutes examining this patient, reviewing medications, and using patient centered shared decision making involving her cardiac care.  Prior to her visit I spent greater than 20 minutes reviewing her past medical history,  medications, and prior cardiac tests.

## 2022-05-07 ENCOUNTER — Encounter: Payer: Self-pay | Admitting: General Practice

## 2022-05-07 ENCOUNTER — Ambulatory Visit: Payer: Medicare Other | Admitting: General Practice

## 2022-05-07 VITALS — BP 100/64 | HR 59 | Ht 66.0 in | Wt 221.0 lb

## 2022-05-07 DIAGNOSIS — R600 Localized edema: Secondary | ICD-10-CM | POA: Diagnosis not present

## 2022-05-07 DIAGNOSIS — I10 Essential (primary) hypertension: Secondary | ICD-10-CM | POA: Diagnosis not present

## 2022-05-07 DIAGNOSIS — I471 Supraventricular tachycardia: Secondary | ICD-10-CM | POA: Diagnosis not present

## 2022-05-07 DIAGNOSIS — G4733 Obstructive sleep apnea (adult) (pediatric): Secondary | ICD-10-CM | POA: Diagnosis not present

## 2022-05-07 MED ORDER — METOPROLOL TARTRATE 25 MG PO TABS: ORAL_TABLET | ORAL | 3 refills | Status: DC

## 2022-05-07 NOTE — Patient Instructions (Signed)
Medication Instructions:  Take Metoprolol 1.5 tablets (37.5 mg total) by mouth in the morning AND 1 tablet (25 mg total) every evening  *If you need a refill on your cardiac medications before your next appointment, please call your pharmacy*  Lab Work:   Testing/Procedures:  NONE    NONE If you have labs (blood work) drawn today and your tests are completely normal, you will receive your results only by:  1-MyChart Message (if you have MyChart) OR  2-A paper copy in the mail.  If you have any lab test that is abnormal or we need to change your treatment, we will call you to review the results.  Special Instructions MAINTAIN YOUR HYDRATION  Please try to avoid these triggers: Do not use any products that have nicotine or tobacco in them. These include cigarettes, e-cigarettes, and chewing tobacco. If you need help quitting, ask your doctor. Eat heart-healthy foods. Talk with your doctor about the right eating plan for you. Exercise regularly as told by your doctor. Stay hydrated Do not drink alcohol, Caffeine or chocolate. Lose weight if you are overweight. Do not use drugs, including cannabis   Follow-Up: Your next appointment:  KEEP SCHEDULED APPOINTMENT  In Person with Shelva Majestic, MD   At [facility], you and your health needs are our priority.  As part of our continuing mission to provide you with exceptional heart care, we have created designated Provider Care Teams.  These Care Teams include your primary Cardiologist (physician) and Advanced Practice Providers (APPs -  Physician Assistants and Nurse Practitioners) who all work together to provide you with the care you need, when you need it.  Important Information About Sugar

## 2022-05-08 NOTE — Progress Notes (Signed)
Subjective:  Patient ID: Sara [name], female    DOB: 1946-05-06,  MRN: [id number]  Chief Complaint  Patient presents with   Plantar Fasciitis    76 y.o. female presents with the above complaint.  Patient presents with left heel pain that has been 2 months is progressive gotten worse hurts with ambulation she has not seen anyone else prior to seeing me.  She states that hurts with taking for step in the morning.  She would like to get it evaluated.  She would like to treat it.  She has tried some conservative care none of which has helped.  Pain scale 7 out of 10 dull achy in nature   Review of Systems: Negative except as noted in the HPI. Denies N/V/F/Ch.  Past Medical History:  Diagnosis Date   Allergic rhinitis due to pollen    Anxiety    Arthritis    "knees, back, right elbow; left shoulder; right ankle" (11/20/2014)   Breast cancer, right breast ([place]) 2016   "DCIS; zero stage" S/P mastectomy, cancer free since then   Cervical spondylosis without myelopathy    Cervicalgia    Chronic lower back pain    Constipation    takes Miralax daily   Degeneration of cervical intervertebral disc    Depression    Diverticulosis of colon (without mention of hemorrhage)    GERD (gastroesophageal reflux disease)    takes Omeprazole daily   Graves' disease    "took a pill to correct"   H/O hiatal hernia    H/O urinary frequency    Headache(784.0)    denies migraines since the 80's but has occ and takes Butalbital prn   HTN (hypertension)    takes Metoprolol and Lisinopril daily   Insomnia    takes Ativan nigtly   Joint pain    "all of them"   Joint swelling    "knees, legs, ankles sometimes" (11/20/2014)   Morbid obesity ([place])    Numbness    LOWER LEGS   Other postablative hypothyroidism    takes SYnthroid daily   Palpitations    Peptic ulcer, unspecified site, unspecified as acute or chronic, without mention of hemorrhage, perforation, or obstruction    Primary localized  osteoarthrosis, lower leg    Pure hypercholesterolemia    takes Pravastin daily   Recurrent UTI    Renal cell carcinoma ([place]) 01/08/2016   Right renal mass s/p partial right nephrectomy, cancer free since 2017   Shortness of breath    Sleep apnea    slight but doesn't require a CPAP (11/20/2014)   Tendonitis of knee    bilateral    Current Outpatient Medications:    acetaminophen (TYLENOL) 500 MG tablet, Take 650 mg by mouth in the morning and at bedtime., Disp: , Rfl:    aspirin EC 81 MG tablet, Take 81 mg by mouth daily. Swallow whole., Disp: , Rfl:    atorvastatin (LIPITOR) 20 MG tablet, Take 1 tablet (20 mg total) by mouth daily., Disp: 30 tablet, Rfl: 11   diphenhydrAMINE (BENADRYL) 50 MG capsule, Take 50 mg by mouth at bedtime., Disp: , Rfl:    ergocalciferol (VITAMIN D2) 1.25 MG (50000 UT) capsule, Take 1 capsule (50,000 Units total) by mouth once a week., Disp: 16 capsule, Rfl: 3   gabapentin (NEURONTIN) 600 MG tablet, TAKE 1 TABLET BY MOUTH  TWICE DAILY, Disp: 180 tablet, Rfl: 3   levothyroxine (SYNTHROID) 100 MCG tablet, Take 100 mcg by mouth daily., Disp: , Rfl:  metoprolol tartrate (LOPRESSOR) 25 MG tablet, Take 1.5 tablets (37.5 mg total) by mouth in the morning AND 1 tablet (25 mg total) every evening., Disp: 75 tablet, Rfl: 3   Multiple Vitamins-Minerals (CENTRUM SILVER PO), Take by mouth., Disp: , Rfl:    omeprazole (PRILOSEC) 20 MG capsule, Take 20 mg by mouth daily. May take a second 20 mg dose as needed for heartburn, Disp: , Rfl:    oxybutynin (DITROPAN) 5 MG tablet, Take 5 mg by mouth 2 (two) times daily. , Disp: , Rfl:    potassium chloride (KLOR-CON) 10 MEQ tablet, TAKE 1 TABLET BY MOUTH  DAILY, Disp: 90 tablet, Rfl: 0   predniSONE (DELTASONE) 10 MG tablet, D.S. 12 days dose pack., Disp: 42 tablet, Rfl: 0   sertraline (ZOLOFT) 50 MG tablet, TAKE 1 TABLET BY MOUTH  DAILY, Disp: 90 tablet, Rfl: 3   torsemide (DEMADEX) 20 MG tablet, TAKE 2 TABLETS BY MOUTH DAILY, Disp:  180 tablet, Rfl: 3  Social History   Tobacco Use  Smoking Status Former   Packs/day: 1.00   Years: 27.00   Total pack years: 27.00   Types: Cigarettes   Quit date: 12/26/1985   Years since quitting: 36.3  Smokeless Tobacco Never  Tobacco Comments   Quit in 1987    Allergies  Allergen Reactions   Propoxyphene Hcl Other (See Comments)     Hallucinations   Pollen Extract Other (See Comments)    Sneezing, congestion   Camphor Itching   Dust Mite Extract Itching    Sneezing. Runny nose    Latex Dermatitis and Rash   Molds & Smuts Itching   Tomato Hives   Objective:  There were no vitals filed for this visit. There is no height or weight on file to calculate BMI. Constitutional Well developed. Well nourished.  Vascular Dorsalis pedis pulses palpable bilaterally. Posterior tibial pulses palpable bilaterally. Capillary refill normal to all digits.  No cyanosis or clubbing noted. Pedal hair growth normal.  Neurologic Normal speech. Oriented to person, place, and time. Epicritic sensation to light touch grossly present bilaterally.  Dermatologic Nails well groomed and normal in appearance. No open wounds. No skin lesions.  Orthopedic: Normal joint ROM without pain or crepitus bilaterally. No visible deformities. Tender to palpation at the calcaneal tuber left. No pain with calcaneal squeeze left. Ankle ROM diminished range of motion left. Silfverskiold Test: positive left.   Radiographs: None  Assessment:   1. Plantar fasciitis, left    Plan:  Patient was evaluated and treated and all questions answered.  Plantar Fasciitis, left - XR reviewed as above.  - Educated on icing and stretching. Instructions given.  - Injection delivered to the plantar fascia as below. - DME: Plantar fascial brace dispensed to support the medial longitudinal arch of the foot and offload pressure from the heel and prevent arch collapse during weightbearing - Pharmacologic management:  None  Procedure: Injection Tendon/Ligament Location: Left plantar fascia at the glabrous junction; medial approach. Skin Prep: alcohol Injectate: 0.5 cc 0.5% marcaine plain, 0.5 cc of 1% Lidocaine, 0.5 cc kenalog 10. Disposition: Patient tolerated procedure well. Injection site dressed with a band-aid.  No follow-ups on file.

## 2022-05-09 DIAGNOSIS — G4733 Obstructive sleep apnea (adult) (pediatric): Secondary | ICD-10-CM | POA: Diagnosis not present

## 2022-05-18 DIAGNOSIS — R600 Localized edema: Secondary | ICD-10-CM | POA: Diagnosis not present

## 2022-05-18 DIAGNOSIS — E039 Hypothyroidism, unspecified: Secondary | ICD-10-CM | POA: Diagnosis not present

## 2022-05-18 DIAGNOSIS — I10 Essential (primary) hypertension: Secondary | ICD-10-CM | POA: Diagnosis not present

## 2022-05-29 ENCOUNTER — Ambulatory Visit: Payer: Medicare Other | Admitting: Podiatry

## 2022-05-30 ENCOUNTER — Other Ambulatory Visit: Payer: Self-pay | Admitting: Cardiovascular Disease

## 2022-06-01 MED ORDER — POTASSIUM CHLORIDE ER 10 MEQ PO TBCR: 10.0000 meq | EXTENDED_RELEASE_TABLET | Freq: Every day | ORAL | 0 refills | Status: DC

## 2022-06-05 ENCOUNTER — Ambulatory Visit: Payer: Medicare Other | Admitting: Podiatry

## 2022-06-05 DIAGNOSIS — M722 Plantar fascial fibromatosis: Secondary | ICD-10-CM | POA: Diagnosis not present

## 2022-06-05 NOTE — Progress Notes (Signed)
Subjective:  Patient ID: Sara [name], female    DOB: 1946/07/29,  MRN: [id number]  Chief Complaint  Patient presents with   Plantar Fasciitis    76 y.o. female presents with the above complaint.  P patient presents for follow-up to left plantar fasciitis.  Patient states that she is doing a little bit better her pain started coming back.  The injection helped.  She would like to discuss next treatment plan denies any other acute complaints   Review of Systems: Negative except as noted in the HPI. Denies N/V/F/Ch.  Past Medical History:  Diagnosis Date   Allergic rhinitis due to pollen    Anxiety    Arthritis    "knees, back, right elbow; left shoulder; right ankle" (11/20/2014)   Breast cancer, right breast ([place]) 2016   "DCIS; zero stage" S/P mastectomy, cancer free since then   Cervical spondylosis without myelopathy    Cervicalgia    Chronic lower back pain    Constipation    takes Miralax daily   Degeneration of cervical intervertebral disc    Depression    Diverticulosis of colon (without mention of hemorrhage)    GERD (gastroesophageal reflux disease)    takes Omeprazole daily   Graves' disease    "took a pill to correct"   H/O hiatal hernia    H/O urinary frequency    Headache(784.0)    denies migraines since the 80's but has occ and takes Butalbital prn   HTN (hypertension)    takes Metoprolol and Lisinopril daily   Insomnia    takes Ativan nigtly   Joint pain    "all of them"   Joint swelling    "knees, legs, ankles sometimes" (11/20/2014)   Morbid obesity ([place])    Numbness    LOWER LEGS   Other postablative hypothyroidism    takes SYnthroid daily   Palpitations    Peptic ulcer, unspecified site, unspecified as acute or chronic, without mention of hemorrhage, perforation, or obstruction    Primary localized osteoarthrosis, lower leg    Pure hypercholesterolemia    takes Pravastin daily   Recurrent UTI    Renal cell carcinoma ([place]) 01/08/2016   Right  renal mass s/p partial right nephrectomy, cancer free since 2017   Shortness of breath    Sleep apnea    slight but doesn't require a CPAP (11/20/2014)   Tendonitis of knee    bilateral    Current Outpatient Medications:    acetaminophen (TYLENOL) 500 MG tablet, Take 650 mg by mouth in the morning and at bedtime., Disp: , Rfl:    aspirin EC 81 MG tablet, Take 81 mg by mouth daily. Swallow whole., Disp: , Rfl:    atorvastatin (LIPITOR) 20 MG tablet, Take 1 tablet (20 mg total) by mouth daily., Disp: 30 tablet, Rfl: 11   diphenhydrAMINE (BENADRYL) 50 MG capsule, Take 50 mg by mouth at bedtime., Disp: , Rfl:    ergocalciferol (VITAMIN D2) 1.25 MG (50000 UT) capsule, Take 1 capsule (50,000 Units total) by mouth once a week., Disp: 16 capsule, Rfl: 3   gabapentin (NEURONTIN) 600 MG tablet, TAKE 1 TABLET BY MOUTH  TWICE DAILY, Disp: 180 tablet, Rfl: 3   levothyroxine (SYNTHROID) 100 MCG tablet, Take 100 mcg by mouth daily., Disp: , Rfl:    metoprolol tartrate (LOPRESSOR) 25 MG tablet, Take 1.5 tablets (37.5 mg total) by mouth in the morning AND 1 tablet (25 mg total) every evening., Disp: 75 tablet, Rfl: 3  Multiple Vitamins-Minerals (CENTRUM SILVER PO), Take by mouth., Disp: , Rfl:    omeprazole (PRILOSEC) 20 MG capsule, Take 20 mg by mouth daily. May take a second 20 mg dose as needed for heartburn, Disp: , Rfl:    oxybutynin (DITROPAN) 5 MG tablet, Take 5 mg by mouth 2 (two) times daily. , Disp: , Rfl:    potassium chloride (KLOR-CON) 10 MEQ tablet, Take 1 tablet (10 mEq total) by mouth daily., Disp: 90 tablet, Rfl: 0   predniSONE (DELTASONE) 10 MG tablet, D.S. 12 days dose pack., Disp: 42 tablet, Rfl: 0   sertraline (ZOLOFT) 50 MG tablet, TAKE 1 TABLET BY MOUTH  DAILY, Disp: 90 tablet, Rfl: 3   torsemide (DEMADEX) 20 MG tablet, TAKE 2 TABLETS BY MOUTH DAILY, Disp: 180 tablet, Rfl: 3  Social History   Tobacco Use  Smoking Status Former   Packs/day: 1.00   Years: 27.00   Total pack years:  27.00   Types: Cigarettes   Quit date: 12/26/1985   Years since quitting: 36.4  Smokeless Tobacco Never  Tobacco Comments   Quit in 1987    Allergies  Allergen Reactions   Propoxyphene Hcl Other (See Comments)     Hallucinations   Pollen Extract Other (See Comments)    Sneezing, congestion   Camphor Itching   Dust Mite Extract Itching    Sneezing. Runny nose    Latex Dermatitis and Rash   Molds & Smuts Itching   Tomato Hives   Objective:  There were no vitals filed for this visit. There is no height or weight on file to calculate BMI. Constitutional Well developed. Well nourished.  Vascular Dorsalis pedis pulses palpable bilaterally. Posterior tibial pulses palpable bilaterally. Capillary refill normal to all digits.  No cyanosis or clubbing noted. Pedal hair growth normal.  Neurologic Normal speech. Oriented to person, place, and time. Epicritic sensation to light touch grossly present bilaterally.  Dermatologic Nails well groomed and normal in appearance. No open wounds. No skin lesions.  Orthopedic: Normal joint ROM without pain or crepitus bilaterally. No visible deformities. Tender to palpation at the calcaneal tuber left. No pain with calcaneal squeeze left. Ankle ROM diminished range of motion left. Silfverskiold Test: positive left.   Radiographs: None  Assessment:   1. Plantar fasciitis, left     Plan:  Patient was evaluated and treated and all questions answered.  Plantar Fasciitis, left - XR reviewed as above.  - Educated on icing and stretching. Instructions given.  -Second injection delivered to the plantar fascia as below. - DME: Continue plantar fascial brace.  Night splint was dispensed - Pharmacologic management: None  Procedure: Injection Tendon/Ligament Location: Left plantar fascia at the glabrous junction; medial approach. Skin Prep: alcohol Injectate: 0.5 cc 0.5% marcaine plain, 0.5 cc of 1% Lidocaine, 0.5 cc kenalog  10. Disposition: Patient tolerated procedure well. Injection site dressed with a band-aid.  No follow-ups on file.

## 2022-06-09 DIAGNOSIS — G4733 Obstructive sleep apnea (adult) (pediatric): Secondary | ICD-10-CM | POA: Diagnosis not present

## 2022-07-01 ENCOUNTER — Ambulatory Visit: Payer: Medicare Other | Attending: Cardiovascular Disease | Admitting: Cardiovascular Disease

## 2022-07-01 ENCOUNTER — Encounter: Payer: Self-pay | Admitting: Cardiovascular Disease

## 2022-07-01 VITALS — BP 104/58 | HR 59 | Ht 66.0 in | Wt 223.8 lb

## 2022-07-01 DIAGNOSIS — G4733 Obstructive sleep apnea (adult) (pediatric): Secondary | ICD-10-CM

## 2022-07-01 DIAGNOSIS — R600 Localized edema: Secondary | ICD-10-CM

## 2022-07-01 DIAGNOSIS — R002 Palpitations: Secondary | ICD-10-CM

## 2022-07-01 DIAGNOSIS — Z853 Personal history of malignant neoplasm of breast: Secondary | ICD-10-CM | POA: Diagnosis not present

## 2022-07-01 DIAGNOSIS — I10 Essential (primary) hypertension: Secondary | ICD-10-CM

## 2022-07-01 DIAGNOSIS — I5032 Chronic diastolic (congestive) heart failure: Secondary | ICD-10-CM | POA: Diagnosis not present

## 2022-07-01 DIAGNOSIS — G4736 Sleep related hypoventilation in conditions classified elsewhere: Secondary | ICD-10-CM | POA: Diagnosis not present

## 2022-07-01 DIAGNOSIS — I5033 Acute on chronic diastolic (congestive) heart failure: Secondary | ICD-10-CM

## 2022-07-01 DIAGNOSIS — E785 Hyperlipidemia, unspecified: Secondary | ICD-10-CM

## 2022-07-01 MED ORDER — METOPROLOL TARTRATE 25 MG PO TABS: ORAL_TABLET | ORAL | 3 refills | Status: DC

## 2022-07-01 MED ORDER — TORSEMIDE 20 MG PO TABS: ORAL_TABLET | ORAL | 3 refills | Status: DC

## 2022-07-01 NOTE — Progress Notes (Signed)
Cardiology Office Note    Date:  07/06/2022   ID:  Sara, [name] Sara [name], MRN [id number]  PCP:  [name], MD  Cardiologist:  Shelva Majestic, MD   7 month follow-up cardiology office visit   History of Present Illness:  Sara [name] is a 76 y.o. female who had remotely seen Dr. Johnsie Cancel in 2014.  I saw her for initial evaluation with me on Sara 24, 2021 and last saw her on December 12, 2021.  She presents for a 54-month follow-up evaluation..    Sara [name] has a history of hypertension, hyperlipidemia, GERD, hypothyroidism, breast and renal cell CA and is status post right mastectomy.  She has a history of mild obstructive sleep apnea not on therapy and has experienced episodes of atypical chest pain.  She was admitted to the hospital in June 2021 with constant left lateral upper pectoral infra chest pain inferior to her shoulder.  Her pain was constant and at times waxed and waned in intensity.  It was not exertionally precipitated.  She presented to the emergency room.  Her EKG was negative for ischemic change.  Troponins were negative.  Laboratory was notable for hypokalemia with potassium of 2.9.  Her ECG shows sinus rhythm with left axis deviation without ectopy or ST-T changes.  D-dimer was upper normal.  It was felt that her chest pain was noncardiac in etiology.  I had seen her for consultation in the hospital and recommended initial noninvasive assessment with coronary CTA I recommended discontinuance of hydrochlorothiazide.  An echo Doppler study on March 05, 2020 showed an EF of 65% without wall motion abnormalities.  There was grade 1 diastolic dysfunction.  There were normal pulmonary pressures.  There was no significant valvular abnormality.  A coronary CTA showed a calcium score at 40, representing 74th percentile for age and sex matched control.  There was mild coronary plaque in the LAD of 0 to 24%.  There was heavy calcification in the RCA creating a potential  blooming artifact.  FFR analysis was normal.  Sara [name] subsequently saw a Rosaria Ferries in the office for follow-up evaluation.  I saw her for my initial evaluation in the office with me on Sara 24, 2021.  At that time, she was experiencing occasional  left-sided chest wall tenderness.  She denied any exertional chest pain.  She developed Covid in June/July 2020.  Subsequently she did not get vaccinated.  At times she had mild swelling in her legs and has been wearing support stockings.  She sees Dr. [name] for primary care.  During that evaluation, she had costochondral tenderness which mimicked her chest pain consistent with musculoskeletal etiology.  I saw her in July 17, 2021.  She has continued to be evaluated by Dr. Delton Coombes at [facility] oncology center.  She denies any exertional chest pain.  At times she notes a rare sensation of a pulling sensation across her chest which is nonexertional.  She does experience some mild shortness of breath with walking.  She has noted some mild ankle swelling.  She is on a regimen consisting of carvedilol 3.125 mg twice a day, and HCTZ 25 mg daily.  She is no longer on amlodipine for hypertension.  She is on levothyroxine 88 mcg for hypothyroidism, takes omeprazole for GERD, and sertraline.  During that evaluation, with her exertional dyspnea I recommended she undergo an echo Doppler study.  Due to her ankle swelling HCTZ was discontinued and she was started on  furosemide 20 mg daily.  Lipid studies in Sara 2021 showed an LDL cholesterol of 69 on atorvastatin 20 mg.  She represented to our office on August 07, 2021 and was seen by Christen Bame, NP.  Her lower extremity edema had not improved since switching to furosemide and also admitted to experiencing orthopnea, PND and palpitations without syncope.  She was avoiding sodium intake.  Compression stockings were recommended.  She was instructed to double her furosemide dose for 3 days  with supplemental potassium.  Because of palpitations a 14-day monitor was recommended which showed predominant sinus rhythm with an average heart rate of 73 bpm.  The slowest heart rate was sinus bradycardia 53 bpm the fastest heart rate was 174 bpm which was a 4 beat episode of SVT.  She had 1 run of nonsustained ventricular tachycardia lasting 4 beats with a maximum rate at 156 bpm (average 134) and there were a total of 17 short episodes of SVT with the longest interval being 20 beats at an average rate at 128 bpm.  There were isolated PACs and atrial couplets and rare isolated PVC.  I saw her in follow-up on September 12, 2021.  At that time she was still experiencing some occasional palpitations and had lower extremity edema.  She has been taking the furosemide 20 mg daily after having taken 3 days of the higher dose.  She continues to be on carvedilol 3.125 mg twice a day, levothyroxine 88 mcg and is on atorvastatin 20 mg for hyperlipidemia.  During his brief episodes of SVT in 1 run of nonsustained VT lasting 4 beats I recommended discontinuance of carvedilol and initiated metoprolol tartrate 25 mg twice a day with potential need for further titration.  With continued edema I recommended further titration of furosemide to 40 mg.    She underwent a 2D echo Doppler study ordered by Stephan Minister, NP on December 10, 2021.  This revealed normal systolic function with EF at 55 to 60% and grade 1 diastolic dysfunction.  There was a very small pericardial effusion posterior to the heart.  She had normal pulmonary artery systolic pressure.  I last saw her on December 12, 2021 at which time she denied any chest pain but was continuing to experience lower extremity edema despite taking furosemide 60 mg daily.  Her palpitations had improved and she continued to be on metoprolol tartrate 25 mg twice a day.  She was on  levothyroxine 100 mcg for hypothyroidism.  She is on atorvastatin 20 mg for hyperlipidemia.  She was  not sleeping well and was aware of snoring, daytime sleepiness, fatigue, and her sleep is nonrestorative.  An Epworth Sleepiness Scale score was calculated in the office which was elevated at 14 consistent with excessive daytime sleepiness.  I recommended she have another sleep evaluation.`  She underwent a sleep study on Jan 26, 2022 at [facility] which showed moderate sleep apnea with an AHI of 15.2/h.  Events were severe during REM sleep with an AHI of 38.8/h and with supine position at 42/h.  She had significant oxygen desaturation to a nadir of 78% and there was moderate snoring noted during the study.  On Feb 11, 2022 a CPAP titration study was performed and it was recommended that she initiate CPAP auto therapy with an EPR of 3 at 8 to 13 cm water pressure range.  Her CPAP set up date was April 08, 2022 with [facility] as her DME company.  A download was obtained from  July 19 through May 07, 2022.  She is meeting compliance standards with 93% usage days and average use at 6 hours 57 minutes.  AHI was 5.9 with 95th percentile pressure 12.2 maximum average pressure 12.5.  Central apnea index was 1.8.  A subsequent download from Sara 11 through June 30, 2022 shows 100% use with average use now at 7 hours and 6 minutes.  AHI is 5.1 with 95th percentile pressure 12.3 with maximum average pressure 12.8.  She typically goes to bed between 9 and 11 PM and wakes up at 6 AM.  Her sleep is improved.  She is unaware of any breakthrough snoring.  An Epworth scale was recalculated in the office today and this endorsed at 11 suggestive of mild residual daytime sleepiness.  She presents for evaluation.   Past Medical History:  Diagnosis Date   Allergic rhinitis due to pollen    Anxiety    Arthritis    "knees, back, right elbow; left shoulder; right ankle" (11/20/2014)   Breast cancer, right breast ([place]) 2016   "DCIS; zero stage" S/P mastectomy, cancer free since then   Cervical spondylosis  without myelopathy    Cervicalgia    Chronic lower back pain    Constipation    takes Miralax daily   Degeneration of cervical intervertebral disc    Depression    Diverticulosis of colon (without mention of hemorrhage)    GERD (gastroesophageal reflux disease)    takes Omeprazole daily   Graves' disease    "took a pill to correct"   H/O hiatal hernia    H/O urinary frequency    Headache(784.0)    denies migraines since the 80's but has occ and takes Butalbital prn   HTN (hypertension)    takes Metoprolol and Lisinopril daily   Insomnia    takes Ativan nigtly   Joint pain    "all of them"   Joint swelling    "knees, legs, ankles sometimes" (11/20/2014)   Morbid obesity ([place])    Numbness    LOWER LEGS   Other postablative hypothyroidism    takes SYnthroid daily   Palpitations    Peptic ulcer, unspecified site, unspecified as acute or chronic, without mention of hemorrhage, perforation, or obstruction    Primary localized osteoarthrosis, lower leg    Pure hypercholesterolemia    takes Pravastin daily   Recurrent UTI    Renal cell carcinoma ([place]) 01/08/2016   Right renal mass s/p partial right nephrectomy, cancer free since 2017   Shortness of breath    Sleep apnea    slight but doesn't require a CPAP (11/20/2014)   Tendonitis of knee    bilateral    Past Surgical History:  Procedure Laterality Date   ABDOMINAL HYSTERECTOMY     ANTERIOR LAT LUMBAR FUSION  05/13/2012   Procedure: ANTERIOR LATERAL LUMBAR FUSION 2 LEVELS;  Surgeon: Faythe Ghee, MD;  Location: [place] [facility];  Service: Neurosurgery;  Laterality: Left;  Left Lumbar Three-four,Lumbar four-five Extreme Lumbar Interbody Fusion with Percutaneous Pedicle Screws   APPENDECTOMY     BREAST BIOPSY Left ~ 2014   BREAST BIOPSY Right 2015   BREAST LUMPECTOMY Left 1988   BREAST RECONSTRUCTION WITH PLACEMENT OF TISSUE EXPANDER AND FLEX HD (ACELLULAR HYDRATED DERMIS) Right 11/20/2014   BREAST RECONSTRUCTION WITH PLACEMENT  OF TISSUE EXPANDER AND FLEX HD (ACELLULAR HYDRATED DERMIS) Right 11/20/2014   Procedure: RIGHT BREAST RECONSTRUCTION WITH PLACEMENT OF TISSUE EXPANDER AND ACELLULAR DERMA MATRIX (ACELLULAR DERMA MATRIX);  Surgeon:  Crissie Reese, MD;  Location: [place];  Service: Plastics;  Laterality: Right;   CATARACT EXTRACTION W/ INTRAOCULAR LENS  IMPLANT, BILATERAL Bilateral    COLONOSCOPY  Sept 2008   RMR: normal rectum, left-sided diverticula, repeat in 2018   COLONOSCOPY WITH PROPOFOL N/A 03/21/2018   Dr. Gala Romney: diverticulosis, internal grade I hemorrhoids. no future colonoscopies unless develops new symptoms   DIAGNOSTIC LAPAROSCOPY     DILATION AND CURETTAGE OF UTERUS     ESOPHAGOGASTRODUODENOSCOPY     ESOPHAGOGASTRODUODENOSCOPY  2013   Dr. Gala Romney: Cervical esophageal web and Schatzki's ring s/p dilation, small hiatal hernia, antral and duodenal erosions likely NSAID effect, chronic duodenitis on path    ESOPHAGOGASTRODUODENOSCOPY (EGD) WITH PROPOFOL N/A 03/21/2018   Dr. Gala Romney: mild Schatzki ring s/p dilation   FOREIGN BODY REMOVAL Right 10/06/2013   Procedure: REMOVAL FOREIGN BODY EXTREMITY;  Surgeon: Carole Civil, MD;  Location: [facility];  Service: Orthopedics;  Laterality: Right;   INJECTION KNEE Left 10/06/2013   Procedure: KNEE INJECTION;  Surgeon: Carole Civil, MD;  Location: [facility];  Service: Orthopedics;  Laterality: Left;   JOINT REPLACEMENT Bilateral    knees   KNEE ARTHROSCOPY Right    KNEE ARTHROSCOPY WITH LATERAL MENISECTOMY Right 10/06/2013   Procedure: KNEE ARTHROSCOPY WITH LATERAL AND MEDIAL MENISECTOMY;  Surgeon: Carole Civil, MD;  Location: [facility];  Service: Orthopedics;  Laterality: Right;  END @ 1234   lumpectomy on pelvis     "mass of nerves"   [place] N/A 03/21/2018   Procedure: MALONEY DILATION;  Surgeon: Daneil Dolin, MD;  Location: [facility];  Service: Endoscopy;  Laterality: N/A;   MASTECTOMY COMPLETE / SIMPLE W/ SENTINEL NODE BIOPSY Right 11/20/2014    axillary   REMOVAL OF TISSUE EXPANDER AND PLACEMENT OF IMPLANT Right 12/13/2014   Procedure: REMOVAL OF RIGHT BREAST TISSUE EXPANDER;  Surgeon: Crissie Reese, MD;  Location: [place];  Service: Plastics;  Laterality: Right;   ROBOTIC ASSITED PARTIAL NEPHRECTOMY Right 01/08/2016   Procedure: XI ROBOTIC ASSITED PARTIAL NEPHRECTOMY;  Surgeon: Alexis Frock, MD;  Location: [facility];  Service: Urology;  Laterality: Right;   SIMPLE MASTECTOMY WITH AXILLARY SENTINEL NODE BIOPSY Right 11/20/2014   Procedure: SIMPLE MASTECTOMY WITH AXILLARY SENTINEL NODE BIOPSY;  Surgeon: Erroll Luna, MD;  Location: [place];  Service: General;  Laterality: Right;   TISSUE EXPANDER REMOVAL Right 12/13/2014   dr Harlow Mares   TONSILLECTOMY     TOTAL KNEE ARTHROPLASTY Right 01/24/2014   Procedure: TOTAL KNEE ARTHROPLASTY;  Surgeon: Carole Civil, MD;  Location: [facility];  Service: Orthopedics;  Laterality: Right;   TOTAL KNEE ARTHROPLASTY Left 08/08/2014   Procedure: TOTAL KNEE ARTHROPLASTY;  Surgeon: Carole Civil, MD;  Location: [facility];  Service: Orthopedics;  Laterality: Left;   TUBAL LIGATION     UPPER GASTROINTESTINAL ENDOSCOPY      Current Medications: Outpatient Medications Prior to Visit  Medication Sig Dispense Refill   acetaminophen (TYLENOL) 500 MG tablet Take 650 mg by mouth in the morning and at bedtime.     aspirin EC 81 MG tablet Take 81 mg by mouth daily. Swallow whole.     atorvastatin (LIPITOR) 20 MG tablet Take 1 tablet (20 mg total) by mouth daily. 30 tablet 11   diphenhydrAMINE (BENADRYL) 50 MG capsule Take 50 mg by mouth at bedtime.     ergocalciferol (VITAMIN D2) 1.25 MG (50000 UT) capsule Take 1 capsule (50,000 Units total) by mouth once a week. 16 capsule  3   gabapentin (NEURONTIN) 600 MG tablet TAKE 1 TABLET BY MOUTH  TWICE DAILY 180 tablet 3   levothyroxine (SYNTHROID) 100 MCG tablet Take 100 mcg by mouth daily.     Multiple Vitamins-Minerals (CENTRUM SILVER PO) Take by mouth.     omeprazole  (PRILOSEC) 20 MG capsule Take 20 mg by mouth daily. May take a second 20 mg dose as needed for heartburn     oxybutynin (DITROPAN) 5 MG tablet Take 5 mg by mouth 2 (two) times daily.      potassium chloride (KLOR-CON) 10 MEQ tablet Take 1 tablet (10 mEq total) by mouth daily. 90 tablet 0   sertraline (ZOLOFT) 50 MG tablet TAKE 1 TABLET BY MOUTH  DAILY 90 tablet 3   metoprolol tartrate (LOPRESSOR) 25 MG tablet Take 1.5 tablets (37.5 mg total) by mouth in the morning AND 1 tablet (25 mg total) every evening. 75 tablet 3   torsemide (DEMADEX) 20 MG tablet TAKE 2 TABLETS BY MOUTH DAILY 180 tablet 3   predniSONE (DELTASONE) 10 MG tablet D.S. 12 days dose pack. (Patient not taking: Reported on 07/01/2022) 42 tablet 0   No facility-administered medications prior to visit.     Allergies:   Propoxyphene hcl, Pollen extract, Camphor, Dust mite extract, Latex, Molds & smuts, and Tomato   Social History   Socioeconomic History   Marital status: Married    Spouse name: Milbert Coulter   Number of children: 4   Years of education: Not on file   Highest education level: Not on file  Occupational History   Occupation: disability    Employer: UNEMPLOYED    Comment: since 2008, knee surgery  Tobacco Use   Smoking status: Former    Packs/day: 1.00    Years: 27.00    Total pack years: 27.00    Types: Cigarettes    Quit date: 12/26/1985    Years since quitting: 36.5   Smokeless tobacco: Never   Tobacco comments:    Quit in 1987  Substance and Sexual Activity   Alcohol use: Yes    Alcohol/week: 0.0 standard drinks of alcohol    Comment: occasional wine   Drug use: No   Sexual activity: Yes    Birth control/protection: Surgical  Other Topics Concern   Not on file  Social History Narrative   Currently married for 10 years. Husband's name is Milbert Coulter. She is an excellent singer. 8 children between she and her husband. 23 grand children. 12 great grandchildren.   Social Determinants of Health   Financial  Resource Strain: Not on file  Food Insecurity: Not on file  Transportation Needs: Not on file  Physical Activity: Not on file  Stress: Not on file  Social Connections: Not on file     Family History:  The patient's family history includes Heart attack in her father, sister, sister, and sister; Lung cancer in her maternal grandmother; Obesity in her brother; Osteoarthritis in her brother and sister; Stroke in her sister.   ROS General: Negative; No fevers, chills, or night sweats;  HEENT: Negative; No changes in vision or hearing, sinus congestion, difficulty swallowing Pulmonary: Negative; No cough, wheezing, shortness of breath, hemoptysis Cardiovascular: See HPI GI: Negative; No nausea, vomiting, diarrhea, or abdominal pain GU: Negative; No dysuria, hematuria, or difficulty voiding Musculoskeletal: Negative; no myalgias, joint pain, or weakness Hematologic/Oncology: History of breast cancer, status post right mastectomy.  History of renal cancer. Endocrine: Negative; no heat/cold intolerance; no diabetes Neuro: Negative; no changes in balance,  headaches Skin: Negative; No rashes or skin lesions Psychiatric: Negative; No behavioral problems, depression Sleep: See HPI Other comprehensive 14 point system review is negative.   PHYSICAL EXAM:   VS:  BP (!) 104/58   Pulse (!) 59   Ht $R'5\' 6"'HM$  (1.676 m)   Wt 223 lb 12.8 oz (101.5 kg)   SpO2 98%   BMI 36.12 kg/m     Repeat blood pressure by me was 92/58.  Wt Readings from Last 3 Encounters:  07/01/22 223 lb 12.8 oz (101.5 kg)  05/07/22 221 lb (100.2 kg)  04/06/22 223 lb 9.6 oz (101.4 kg)    General: Alert, oriented, no distress.  Skin: normal turgor, no rashes, warm and dry HEENT: Normocephalic, atraumatic. Pupils equal round and reactive to light; sclera anicteric; extraocular muscles intact;  Nose without nasal septal hypertrophy Mouth/Parynx benign; Mallinpatti scale 4 Neck: No JVD, no carotid bruits; normal carotid  upstroke Lungs: clear to ausculatation and percussion; no wheezing or rales Chest wall: without tenderness to palpitation Heart: PMI not displaced, RRR, s1 s2 normal, 1/6 systolic murmur, no diastolic murmur, no rubs, gallops, thrills, or heaves Abdomen: soft, nontender; no hepatosplenomehaly, BS+; abdominal aorta nontender and not dilated by palpation. Back: no CVA tenderness Pulses 2+ Musculoskeletal: full range of motion, normal strength, no joint deformities Extremities: Improvement in prior 1-2+ lower extremity edema, now minimal; no clubbing cyanosis or edema, Homan's sign negative  Neurologic: grossly nonfocal; Cranial nerves grossly wnl Psychologic: Normal mood and affect    Studies/Labs Reviewed:   October 11, 2023ECG (independently read by me): Sinus bradycardia at 59, LAD PRWP  December 12, 2021 ECG (independently read by me):  NSR at 64; LAD, normal intervals  September 12, 2022 ECG (independently read by me):  NSR at 73; biatrial enlargement ; LAD; QTc 467  July 17, 2021 ECG (independently read by me):  NSR at 73, LAE, LAD, Reduced anteriorly  Sara 24, 20212 ECG (independently read by me): Normal sinus rhythm at 62 bpm.  Possible left atrial enlargement.  Low voltage anteriorly.  Recent Labs:    Latest Ref Rng & Units 12/26/2021    3:02 PM 10/15/2021   10:02 AM 08/08/2021   10:44 AM  BMP  Glucose 70 - 99 mg/dL 106  150  97   BUN 8 - 27 mg/dL $Remove'15  13  10   'iMBMWBZ$ Creatinine 0.57 - 1.00 mg/dL 1.04  0.95  0.86   BUN/Creat Ratio 12 - $Re'28 14   12   'Vll$ Sodium 134 - 144 mmol/L 141  137  140   Potassium 3.5 - 5.2 mmol/L 3.9  3.9  4.1   Chloride 96 - 106 mmol/L 99  103  101   CO2 20 - 29 mmol/L $RemoveB'28  26  26   'MdZLzhTP$ Calcium 8.7 - 10.3 mg/dL 8.7  8.0  8.4         Latest Ref Rng & Units 12/26/2021    3:02 PM 10/15/2021   10:02 AM 10/14/2020    9:04 AM  Hepatic Function  Total Protein 6.0 - 8.5 g/dL 5.7  6.1  6.9   Albumin 3.7 - 4.7 g/dL 3.3  2.8  3.1   AST 0 - 40 IU/L 46  29  37   ALT  0 - 32 IU/L 32  26  29   Alk Phosphatase 44 - 121 IU/L 88  69  73   Total Bilirubin 0.0 - 1.2 mg/dL 0.4  0.4  0.7  Latest Ref Rng & Units 10/15/2021   10:02 AM 10/14/2020    9:04 AM 03/28/2020   12:30 PM  CBC  WBC 4.0 - 10.5 K/uL 10.5  7.1  7.1   Hemoglobin 12.0 - 15.0 g/dL 14.7  13.8  13.0   Hematocrit 36.0 - 46.0 % 43.5  41.8  40.1   Platelets 150 - 400 K/uL 224  282  215    Lab Results  Component Value Date   MCV 100.0 10/15/2021   MCV 97.7 10/14/2020   MCV 98.0 03/28/2020   Lab Results  Component Value Date   TSH 1.504 03/04/2020   No results found for: "HGBA1C"   BNP    Component Value Date/Time   BNP 19.4 12/26/2021 1502    ProBNP No results found for: "PROBNP"   Lipid Panel     Component Value Date/Time   CHOL 141 06/07/2020 0833   TRIG 71 06/07/2020 0833   HDL 57 06/07/2020 0833   CHOLHDL 2.5 06/07/2020 0833   VLDL 14 03/06/2020 0420   LDLCALC 69 06/07/2020 0833     RADIOLOGY: No results found.   Additional studies/ records that were reviewed today include:  I reviewed the records from the patient's recent hospitalization as well as follow-up office visit with Rosaria Ferries, PA  ECHO: 07/31/2021 IMPRESSIONS   1. Left ventricular ejection fraction, by estimation, is 55 to 60%. Left  ventricular ejection fraction by 3D volume is 57 %. The left ventricle has  normal function. The left ventricle has no regional wall motion  abnormalities. Left ventricular diastolic   parameters were normal.   2. Right ventricular systolic function is normal. The right ventricular  size is normal. There is normal pulmonary artery systolic pressure. The  estimated right ventricular systolic pressure is 59.1 mmHg.   3. The pericardial effusion is posterior to the left ventricle and  localized near the right atrium.   4. The mitral valve is normal in structure. Trivial mitral valve  regurgitation. No evidence of mitral stenosis.   5. The aortic valve is  tricuspid. Aortic valve regurgitation is not  visualized. Mild aortic valve sclerosis is present, with no evidence of  aortic valve stenosis.   6. The inferior vena cava is normal in size with greater than 50%  respiratory variability, suggesting right atrial pressure of 3 mmHg.   ---------------------------------------------------------------------------------------------------------------------     Patient Name: Sara, [name] Study Date: 02/11/2022 Gender: Female D.O.B: [birth year] Age (years): 36 Referring Provider: Shelva Majestic MD, ABSM Height (inches): 66 Interpreting Physician: Shelva Majestic MD, ABSM Weight (lbs): 229 RPSGT: Rosebud Poles BMI: 37 MRN: [id number] Neck Size: 15.50   CLINICAL INFORMATION The patient is referred for a CPAP titration to treat sleep apnea.   Date of NPSG:  01/26/2022: AHI: 15.2/h; REM AHI 38.8/h: supine AHI 42.0/h; O2 nadir 78%.   SLEEP STUDY TECHNIQUE As per the AASM Manual for the Scoring of Sleep and Associated Events v2.3 (April 2016) with a hypopnea requiring 4% desaturations.   The channels recorded and monitored were frontal, central and occipital EEG, electrooculogram (EOG), submentalis EMG (chin), nasal and oral airflow, thoracic and abdominal wall motion, anterior tibialis EMG, snore microphone, electrocardiogram, and pulse oximetry. Continuous positive airway pressure (CPAP) was initiated at the beginning of the study and titrated to treat sleep-disordered breathing.   MEDICATIONS acetaminophen (TYLENOL) 500 MG tablet aspirin EC 81 MG tablet atorvastatin (LIPITOR) 20 MG tablet (Expired) diphenhydrAMINE (BENADRYL) 50 MG capsule ergocalciferol (VITAMIN D2) 1.25 MG (50000 UT) capsule  gabapentin (NEURONTIN) 600 MG tablet levothyroxine (SYNTHROID) 100 MCG tablet metoprolol tartrate (LOPRESSOR) 25 MG tablet omeprazole (PRILOSEC) 20 MG capsule oxybutynin (DITROPAN) 5 MG tablet potassium chloride (KLOR-CON) 10 MEQ tablet predniSONE  (DELTASONE) 10 MG tablet sertraline (ZOLOFT) 50 MG tablet torsemide (DEMADEX) 20 MG tablet Medications self-administered by patient taken the night of the study : N/A   TECHNICIAN COMMENTS Comments added by technician: CPAP therapy started at 4 cm of H2O. Titration increased to 8 CWP with an EPR of 2. Patient tolerated CPAP pressure fairly well. Central-like events noted to increase in final pressure of 8 CWP at times in supine position. EPR of 2 was applied, but not seen in supine position Comments added by scorer: N/A   RESPIRATORY PARAMETERS Optimal PAP Pressure (cm):  8          AHI at Optimal Pressure (/hr):            2.8 Overall Minimal O2 (%):         84.00   Supine % at Optimal Pressure (%):    62 Minimal O2 at Optimal Pressure (%): 87.0        SLEEP ARCHITECTURE The study was initiated at 10:10:01 PM and ended at 6:03:21 AM.   Sleep onset time was 53.8 minutes and the sleep efficiency was 80.9%. The total sleep time was 383 minutes.   The patient spent 4.44% of the night in stage N1 sleep, 59.27% in stage N2 sleep, 16.45% in stage N3 and 19.8% in REM.Stage REM latency was 168.5 minutes   Wake after sleep onset was 36.6. Alpha intrusion was absent. Supine sleep was 51.78%.   CARDIAC DATA The 2 lead EKG demonstrated sinus rhythm. The mean heart rate was 61.79 beats per minute. Other EKG findings include: None.   LEG MOVEMENT DATA The total Periodic Limb Movements of Sleep (PLMS) were 0. The PLMS index was 0.00. A PLMS index of <15 is considered normal in adults.   IMPRESSIONS - CPAP was initiated at 4 cm and was titrated to 8 cm of water (AHI 2.1/h; O2 nadir 87%) - Central sleep apnea was not noted during this titration (CAI = 1.9/h). - Moderate oxygen desaturations to a nadir of 84%. - The patient snored with soft snoring volume during this titration study. - No cardiac abnormalities were observed during this study. - Clinically significant periodic limb movements were not  noted during this study. Arousals associated with PLMs were rare.   DIAGNOSIS - Obstructive Sleep Apnea (G47.33)   RECOMMENDATIONS - Recommend an initial trial of CPAP Auto therapy with EPR of 3 at 8 -13 cm H2O with heated humidification. A medium size Resmed Nasal Airfit N20 mask was used for the titration. - Effort should be made to otpimize nasal and oropharyngeal patency. - Avoid alcohol, sedatives and other CNS depressants that may worsen sleep apnea and disrupt normal sleep architecture. - Sleep hygiene should be reviewed to assess factors that may improve sleep quality. - Weight management and regular exercise should be initiated or continued. - Recommend a download and sleep clinic evaluation after 4 weeks of therapy. ASSESSMENT:    1. Essential hypertension   2. OSA (obstructive sleep apnea)   3. Palpitations   4. Chronic diastolic heart failure ([place])   5. Hypoxemia associated with sleep   6. Hyperlipidemia with target LDL less than 70   7. History of breast cancer     PLAN:  Sara [name] is a pleasant 76year-old female who has a history  of hypertension,  hyperlipidemia, hypothyroidism, GERD, previous mild OSA not on therapy.  In the past, she has experienced episodes of atypical chest pain, mostly of musculoskeletal etiology.  A coronary CTA showed mild disease and I suspect there was blooming artifact in the RCA.  FFR analysis was normal.  On prior evaluation she has experienced costochondral tenderness.  She has experienced lower extremity edema which continued despite HCTZ leading to switching to furosemide.  Her edema persists despite now increasing her Lasix to 60 mg daily.  As result, I have suggested changing to torsemide 40 mg in the morning and if continued swelling persist after several days on this regimen she was advised to possibly take an extra 20 mg in the afternoon as needed.  She has had issues of palpitations in the past and a 14-day monitor had shown predominant  sinus rhythm with several episodes of short SVT and 1 episode of nonsustained VT lasting 4 beats at a rate of 156.  Palpitations significantly improved with the change from carvedilol to metoprolol.  When last seen in March 2023 I discussed gust the importance of sodium restriction particularly with her lower extremity edema.  At that time I was concerned of progressive obstructive sleep apnea.  Subsequent sleep evaluation in May 2023 confirmed moderate overall sleep apnea with an AHI 15.2 but sleep apnea was severe during REM sleep with an AHI of 38.8 and with supine position with an AHI of 42.0.  She had significant oxygen desaturation to a nadir of 78%.  She has been on CPAP therapy since her set up date of April 08, 2022 and is meeting compliance standards on her subsequent evaluations.  She continues to have mild residual sleep apnea with an AHI of 5.1 with a central index of 2.0.  She continues also to have some mild residual daytime sleepiness.  I have recommended slight adjustment to her CPAP setting and will change a pressure range to 9 to 15 cm of water and increase her ramp start pressure to 6 cm.  Her blood pressure today is low and on repeat by me was 92/58.  I have recommended slight reduction of her metoprolol dose and she will continue 25 mg in the morning but take a reduced dose of 12.5 mg at night.  Her peripheral edema has improved and have recommended slight reduction of torsemide from 40 mg daily to alternating 40 and 20 mg every other day.  She will monitor her blood pressure.  She continues to be on gabapentin for neuropathy.  She is on levothyroxine 100 mcg for hypothyroidism.  She is on atorvastatin 20 mg for hyperlipidemia.  She continues to be followed by Dr. Delton Coombes with her history of breast CA.  I will see her in 6 months for reevaluation.    Medication Adjustments/Labs and Tests Ordered: Current medicines are reviewed at length with the patient today.  Concerns regarding  medicines are outlined above.  Medication changes, Labs and Tests ordered today are listed in the Patient Instructions below. Patient Instructions  Medication Instructions:  Decrease Metoprolol Tart to (25 mg) 1 tablet in morning and 1/2 tablet (12.5 mg) at night  Take Torsemide 40 mg ( 2 tablets )  alternating 20 mg ( 1 tablet )  every other day  Continue all other medications  *If you need a refill on your cardiac medications before your next appointment, please call your pharmacy*   Lab Work: None ordered   Testing/Procedures: None ordered   Follow-Up: At  [facility], you and your health needs are our priority.  As part of our continuing mission to provide you with exceptional heart care, we have created designated Provider Care Teams.  These Care Teams include your primary Cardiologist (physician) and Advanced Practice Providers (APPs -  Physician Assistants and Nurse Practitioners) who all work together to provide you with the care you need, when you need it.  We recommend signing up for the patient portal called "MyChart".  Sign up information is provided on this After Visit Summary.  MyChart is used to connect with patients for Virtual Visits (Telemedicine).  Patients are able to view lab/test results, encounter notes, upcoming appointments, etc.  Non-urgent messages can be sent to your provider as well.   To learn more about what you can do with MyChart, go to [email or web address].    Your next appointment:  6 months    Call in Jan to schedule April appointment     The format for your next appointment: Office    Provider:  [facility]  When you turn cpap on tonight settings will be changed   Important Information About Sugar         Signed, Shelva Majestic, MD  07/06/2022 11:31 AM    [facility] [address] [place], [place], [place]  [address] Phone: [phone]

## 2022-07-01 NOTE — Patient Instructions (Signed)
Medication Instructions:  Decrease Metoprolol Tart to (25 mg) 1 tablet in morning and 1/2 tablet (12.5 mg) at night  Take Torsemide 40 mg ( 2 tablets )  alternating 20 mg ( 1 tablet )  every other day  Continue all other medications  *If you need a refill on your cardiac medications before your next appointment, please call your pharmacy*   Lab Work: None ordered   Testing/Procedures: None ordered   Follow-Up: At [facility], you and your health needs are our priority.  As part of our continuing mission to provide you with exceptional heart care, we have created designated Provider Care Teams.  These Care Teams include your primary Cardiologist (physician) and Advanced Practice Providers (APPs -  Physician Assistants and Nurse Practitioners) who all work together to provide you with the care you need, when you need it.  We recommend signing up for the patient portal called "MyChart".  Sign up information is provided on this After Visit Summary.  MyChart is used to connect with patients for Virtual Visits (Telemedicine).  Patients are able to view lab/test results, encounter notes, upcoming appointments, etc.  Non-urgent messages can be sent to your provider as well.   To learn more about what you can do with MyChart, go to [email or web address].    Your next appointment:  6 months    Call in Jan to schedule April appointment     The format for your next appointment: Office    Provider:  [facility]  When you turn cpap on tonight settings will be changed   Important Information About Sugar

## 2022-07-06 ENCOUNTER — Encounter: Payer: Self-pay | Admitting: Cardiovascular Disease

## 2022-07-08 ENCOUNTER — Other Ambulatory Visit: Payer: Self-pay | Admitting: Hematology

## 2022-07-08 DIAGNOSIS — E559 Vitamin D deficiency, unspecified: Secondary | ICD-10-CM

## 2022-07-09 DIAGNOSIS — G4733 Obstructive sleep apnea (adult) (pediatric): Secondary | ICD-10-CM | POA: Diagnosis not present

## 2022-07-13 DIAGNOSIS — R600 Localized edema: Secondary | ICD-10-CM | POA: Diagnosis not present

## 2022-07-13 DIAGNOSIS — E039 Hypothyroidism, unspecified: Secondary | ICD-10-CM | POA: Diagnosis not present

## 2022-07-13 DIAGNOSIS — I10 Essential (primary) hypertension: Secondary | ICD-10-CM | POA: Diagnosis not present

## 2022-07-17 ENCOUNTER — Encounter (HOSPITAL_COMMUNITY): Payer: Self-pay | Admitting: *Deleted

## 2022-07-17 ENCOUNTER — Other Ambulatory Visit: Payer: Self-pay

## 2022-07-17 ENCOUNTER — Emergency Department (HOSPITAL_COMMUNITY): Payer: Medicare Other

## 2022-07-17 ENCOUNTER — Emergency Department (HOSPITAL_COMMUNITY)
Admission: EM | Admit: 2022-07-17 | Discharge: 2022-07-17 | Disposition: A | Discharge status: Home / Self Care | Payer: Medicare Other | Attending: Emergency Medicine | Admitting: Emergency Medicine

## 2022-07-17 DIAGNOSIS — Z7982 Long term (current) use of aspirin: Secondary | ICD-10-CM | POA: Diagnosis not present

## 2022-07-17 DIAGNOSIS — Z91040 Latex allergy status: Secondary | ICD-10-CM | POA: Insufficient documentation

## 2022-07-17 DIAGNOSIS — M79604 Pain in right leg: Secondary | ICD-10-CM | POA: Diagnosis not present

## 2022-07-17 DIAGNOSIS — M7989 Other specified soft tissue disorders: Secondary | ICD-10-CM | POA: Diagnosis not present

## 2022-07-17 DIAGNOSIS — Z79899 Other long term (current) drug therapy: Secondary | ICD-10-CM | POA: Insufficient documentation

## 2022-07-17 DIAGNOSIS — I10 Essential (primary) hypertension: Secondary | ICD-10-CM | POA: Diagnosis not present

## 2022-07-17 DIAGNOSIS — M1611 Unilateral primary osteoarthritis, right hip: Secondary | ICD-10-CM | POA: Diagnosis not present

## 2022-07-17 DIAGNOSIS — M25551 Pain in right hip: Secondary | ICD-10-CM | POA: Insufficient documentation

## 2022-07-17 MED ORDER — PREDNISONE 10 MG PO TABS: 20.0000 mg | ORAL_TABLET | Freq: Every day | ORAL | 0 refills | Status: DC

## 2022-07-17 MED ORDER — OXYCODONE-ACETAMINOPHEN 5-325 MG PO TABS: 1.0000 | ORAL_TABLET | Freq: Four times a day (QID) | ORAL | 0 refills | Status: DC | PRN

## 2022-07-17 MED ORDER — OXYCODONE-ACETAMINOPHEN 5-325 MG PO TABS
1.0000 | ORAL_TABLET | Freq: Once | ORAL | Status: AC
  Administered 2022-07-17: 1 via ORAL
  Filled 2022-07-17: qty 1

## 2022-07-17 NOTE — Discharge Instructions (Signed)
Call Dr. Ruthe Mannan office today and follow-up with him next week

## 2022-07-17 NOTE — ED Triage Notes (Signed)
Pt c/o right leg swelling x 1 month, leg feeling cool x several days, aching leg pain x few days, worsening this morning. Pt reports her right leg has felt weak for several weeks and she almost fell yesterday.

## 2022-07-17 NOTE — ED Provider Notes (Signed)
[facility] EMERGENCY DEPARTMENT Provider Note   CSN: [id number] Arrival date & time: 07/17/22  8937     History {Add pertinent medical, surgical, social history, OB history to HPI:1} Chief Complaint  Patient presents with   Leg Pain    Sara [name] is a 76 y.o. female.  Patient has pain in her right hip.  Patient has a history of obesity and hypertension   Leg Pain      Home Medications Prior to Admission medications   Medication Sig Start Date End Date Taking? Authorizing Provider  acetaminophen (TYLENOL) 500 MG tablet Take 650 mg by mouth in the morning and at bedtime.   Yes [provider]  aspirin EC 81 MG tablet Take 81 mg by mouth daily. Swallow whole.   Yes [provider]  atorvastatin (LIPITOR) 20 MG tablet Take 1 tablet (20 mg total) by mouth daily. 03/07/20 07/17/22 Yes Georgette Shell, MD  diphenhydrAMINE (BENADRYL) 50 MG capsule Take 50 mg by mouth at bedtime.   Yes [provider]  gabapentin (NEURONTIN) 600 MG tablet TAKE 1 TABLET BY MOUTH  TWICE DAILY 01/12/22  Yes Carole Civil, MD  levothyroxine (SYNTHROID) 100 MCG tablet Take 100 mcg by mouth daily. 11/12/21  Yes [provider]  metoprolol tartrate (LOPRESSOR) 25 MG tablet Take 25 mg ( 1 tablet ) in morning and take ( 1/2  tablet ) 12.5 mg at night 07/01/22  Yes Troy Sine, MD  Multiple Vitamins-Minerals (CENTRUM SILVER PO) Take 1 tablet by mouth daily.   Yes [provider]  omeprazole (PRILOSEC) 20 MG capsule Take 20 mg by mouth daily. May take a second 20 mg dose as needed for heartburn   Yes [provider]  oxybutynin (DITROPAN) 5 MG tablet Take 5 mg by mouth 2 (two) times daily.  07/31/15  Yes [provider]  oxyCODONE-acetaminophen (PERCOCET/ROXICET) 5-325 MG tablet Take 1 tablet by mouth every 6 (six) hours as needed for severe pain. 07/17/22  Yes Milton Ferguson, MD  potassium chloride (KLOR-CON) 10 MEQ tablet Take 1  tablet (10 mEq total) by mouth daily. 06/01/22  Yes Troy Sine, MD  predniSONE (DELTASONE) 10 MG tablet Take 2 tablets (20 mg total) by mouth daily. 07/17/22  Yes Milton Ferguson, MD  sertraline (ZOLOFT) 50 MG tablet TAKE 1 TABLET BY MOUTH  DAILY 08/26/21  Yes Derek Jack, MD  torsemide (DEMADEX) 20 MG tablet Take 2 tablets ( 40 mg ) alternating 1 tablet ( 20 mg ) every other day 07/01/22  Yes Troy Sine, MD  Vitamin D, Ergocalciferol, (DRISDOL) 1.25 MG (50000 UNIT) CAPS capsule TAKE 1 CAPSULE BY MOUTH WEEKLY 07/10/22  Yes Dede Query T, PA-C  Ranitidine HCl (ZANTAC 75 PO) Take by mouth.    12/01/11  [provider]      Allergies    Propoxyphene hcl, Pollen extract, Camphor, Dust mite extract, Latex, Molds & smuts, and Tomato    Review of Systems   Review of Systems  Physical Exam Updated Vital Signs BP 110/61   Pulse (!) 56   Temp 98 F (36.7 C) (Oral)   Resp (!) 23   Ht '5\' 6"'$  (1.676 m)   Wt 98 kg   SpO2 93%   BMI 34.86 kg/m  Physical Exam  ED Results / Procedures / Treatments   Labs (all labs ordered are listed, but only abnormal results are displayed) Labs Reviewed - No data to display  EKG None  Radiology  DG Hip Unilat W or Wo Pelvis 2-3 Views Right  Result Date: 07/17/2022 CLINICAL DATA:  Right leg swelling for 1 month EXAM: DG HIP (WITH PELVIS) 2V RIGHT COMPARISON:  None Available. FINDINGS: No fracture. No dislocation. Mild degenerative changes at the right hip joint. Multiple radiodensities projecting over the pelvis are favored represent pelvic phleboliths. Partially visualized lumbar spinal fusion hardware in place without evidence of acute complications. Left hip demonstrates minimal joint space loss. Bilateral SI joints are symmetric IMPRESSION: No fracture or dislocation Electronically Signed   By: Marin Roberts M.D.   On: 07/17/2022 08:20    Procedures Procedures  {Document cardiac monitor, telemetry assessment procedure when  appropriate:1}  Medications Ordered in ED Medications  oxyCODONE-acetaminophen (PERCOCET/ROXICET) 5-325 MG per tablet 1 tablet (1 tablet Oral Given 07/17/22 5093)    ED Course/ Medical Decision Making/ A&P                           Medical Decision Making Amount and/or Complexity of Data Reviewed Radiology: ordered.  Risk Prescription drug management.   Patient with pain in her right hip.  She will be placed on prednisone and pain medicines and follow-up with Ortho  {Document critical care time when appropriate:1} {Document review of labs and clinical decision tools ie heart score, Chads2Vasc2 etc:1}  {Document your independent review of radiology images, and any outside records:1} {Document your discussion with family members, caretakers, and with consultants:1} {Document social determinants of health affecting pt's care:1} {Document your decision making why or why not admission, treatments were needed:1} Final Clinical Impression(s) / ED Diagnoses Final diagnoses:  Right leg pain    Rx / DC Orders ED Discharge Orders          Ordered    predniSONE (DELTASONE) 10 MG tablet  Daily        07/17/22 0932    oxyCODONE-acetaminophen (PERCOCET/ROXICET) 5-325 MG tablet  Every 6 hours PRN        07/17/22 0932

## 2022-07-25 ENCOUNTER — Other Ambulatory Visit: Payer: Self-pay | Admitting: Hematology

## 2022-07-25 DIAGNOSIS — F419 Anxiety disorder, unspecified: Secondary | ICD-10-CM

## 2022-07-27 ENCOUNTER — Encounter: Payer: Self-pay | Admitting: Orthopedic Surgery

## 2022-07-27 ENCOUNTER — Ambulatory Visit: Payer: Medicare Other | Admitting: Orthopedic Surgery

## 2022-07-27 VITALS — BP 113/63 | HR 61 | Ht 66.0 in | Wt 216.0 lb

## 2022-07-27 DIAGNOSIS — M5416 Radiculopathy, lumbar region: Secondary | ICD-10-CM | POA: Diagnosis not present

## 2022-07-27 NOTE — Progress Notes (Signed)
Office Visit Note   Patient: Sara [name]           Date of Birth: Feb 22, 1946           MRN: [id number] Visit Date: 07/27/2022              Requested by: [name], [address] [place],  [place] [id number] PCP: [name], MD   Assessment & Plan: Visit Diagnoses:  1. Lumbar radiculopathy, right     Plan:  Personal interpretation of imaging:  The x-rays of the pelvis and right hip show no degenerative changes of any consequence.  I would not not call this arthritis of the hips  She does have evidence of the lumbar fusion.  Recommend she follow-up with Dr. Trenton Gammon for evaluation of the numbness and tingling of the right leg  No evidence of hip pathology  Follow-Up Instructions: No follow-ups on file.   Orders:  No orders of the defined types were placed in this encounter.  No orders of the defined types were placed in this encounter.     Procedures: No procedures performed   Clinical Data: No additional findings.   Subjective: Chief Complaint  Patient presents with   Right Hip - Pain    76 years old female status post bilateral knee replacements status post lumbar fusion presents with a 1 year history of intermittent pain numbness tingling weakness right lower extremity exacerbated over the last month.  She went to the emergency room had x-rays of her hip she was told she had mild arthritis  She did get good relief from steroid Dosepak and oxycodone comes in for evaluation and management    Review of Systems  Constitutional:        No fever, no bowel or bladder dysfunction     Objective: Vital Signs: BP 113/63   Pulse 61   Ht '5\' 6"'$  (1.676 m)   Wt 216 lb (98 kg)   BMI 34.86 kg/m   Physical Exam Vitals and nursing note reviewed.  Constitutional:      General: She is not in acute distress.    Appearance: Normal appearance. She is not ill-appearing, toxic-appearing or diaphoretic.  HENT:     Head: Normocephalic and atraumatic.      Nose: Nose normal. No congestion or rhinorrhea.  Eyes:     General: No scleral icterus.       Right eye: No discharge.        Left eye: No discharge.     Extraocular Movements: Extraocular movements intact.     Conjunctiva/sclera: Conjunctivae normal.     Pupils: Pupils are equal, round, and reactive to light.  Cardiovascular:     Pulses: Normal pulses.  Pulmonary:     Effort: Pulmonary effort is normal.     Breath sounds: No wheezing.  Musculoskeletal:     Comments: Normal range of motion of both hips she does have some buttock pain with right hip manipulation leg lengths are equal  Lumbar spine is tender in the center throughout L1-5 and then on the right and left sides as well  Skin:    General: Skin is warm and dry.     Capillary Refill: Capillary refill takes less than 2 seconds.     Coloration: Skin is not jaundiced.     Findings: No erythema.  Neurological:     General: No focal deficit present.     Mental Status: She is alert and oriented to  person, place, and time.  Psychiatric:        Mood and Affect: Mood normal.        Behavior: Behavior normal.        Thought Content: Thought content normal.        Judgment: Judgment normal.     Ortho Exam as above  Specialty Comments:  No specialty comments available.  Imaging: No results found.   PMFS History: Patient Active Problem List   Diagnosis Date Noted   Depression 03/05/2020   Hypokalemia    Encounter for screening colonoscopy 01/25/2018   Renal cell carcinoma ([facility]) 01/08/2016   Right kidney mass 12/23/2015   Liver lesion 12/23/2015   Malignant neoplasm of upper outer quadrant of female breast ([facility]) 10/11/2014   S/P total knee replacement, right 01/24/14 08/08/2014   Difficulty in walking(719.7) 02/15/2014   Postoperative stiffness of total knee replacement ([facility]) 02/15/2014   Pain in joint, lower leg 02/15/2014   Abnormality of gait 10/11/2013   S/P total knee replacement, left 08/08/14 10/09/2013    Radicular pain of right lower extremity 07/25/2013   Knee instability 07/25/2013   Right knee pain 07/25/2013   Atypical chest pain 04/25/2013   Patellar tendonitis 09/07/2012   OA (osteoarthritis) of knee 02/09/2012   GERD (gastroesophageal reflux disease) 12/01/2011   Dysphagia 12/01/2011   RUQ pain 12/01/2011   Fatty liver 12/01/2011   Hematochezia 12/01/2011   INTERMITTENT VERTIGO 09/12/2010   KNEE, ARTHRITIS, DEGEN./OSTEO 04/03/2009   CHEST PAIN, RECURRENT 03/07/2009   OTHER DYSPHAGIA 03/07/2009   PALPITATIONS 02/06/2009   LIVER FUNCTION TESTS, ABNORMAL, HX OF 01/01/2009   PUD 12/31/2008   BACK PAIN 12/24/2008   ANSERINE BURSITIS 12/24/2008   BENIGN POSITIONAL VERTIGO 07/16/2008   H N P-LUMBAR 04/02/2008   SPINAL STENOSIS, CERVICAL 02/15/2008   SPINAL STENOSIS, LUMBAR 02/15/2008   Patellar tendinitis 10/19/2007   TEAR LATERAL MENISCUS 06/13/2007   OBESITY NOS 10/29/2006   ALLERGIC RHINITIS, SEASONAL 10/29/2006   Acquired hypothyroidism 09/07/2006   Hyperlipidemia 09/07/2006   Essential hypertension 09/07/2006   GERD 09/07/2006   DIVERTICULOSIS, COLON 09/07/2006   OVERACTIVE BLADDER 09/07/2006   FIBROCYSTIC BREAST DISEASE 09/07/2006   OSTEOARTHRITIS 09/07/2006   Past Medical History:  Diagnosis Date   Allergic rhinitis due to pollen    Anxiety    Arthritis    "knees, back, right elbow; left shoulder; right ankle" (11/20/2014)   Breast cancer, right breast ([facility]) 2016   "DCIS; zero stage" S/P mastectomy, cancer free since then   Cervical spondylosis without myelopathy    Cervicalgia    Chronic lower back pain    Constipation    takes Miralax daily   Degeneration of cervical intervertebral disc    Depression    Diverticulosis of colon (without mention of hemorrhage)    GERD (gastroesophageal reflux disease)    takes Omeprazole daily   Graves' disease    "took a pill to correct"   H/O hiatal hernia    H/O urinary frequency    Headache(784.0)    denies  migraines since the 80's but has occ and takes Butalbital prn   HTN (hypertension)    takes Metoprolol and Lisinopril daily   Insomnia    takes Ativan nigtly   Joint pain    "all of them"   Joint swelling    "knees, legs, ankles sometimes" (11/20/2014)   Morbid obesity ([facility])    Numbness    LOWER LEGS   Other postablative hypothyroidism    takes SYnthroid  daily   Palpitations    Peptic ulcer, unspecified site, unspecified as acute or chronic, without mention of hemorrhage, perforation, or obstruction    Primary localized osteoarthrosis, lower leg    Pure hypercholesterolemia    takes Pravastin daily   Recurrent UTI    Renal cell carcinoma ([facility]) 01/08/2016   Right renal mass s/p partial right nephrectomy, cancer free since 2017   Shortness of breath    Sleep apnea    slight but doesn't require a CPAP (11/20/2014)   Tendonitis of knee    bilateral    Family History  Problem Relation Age of Onset   Stroke Sister    Heart attack Sister        In her 67s   Osteoarthritis Sister    Heart attack Sister        In her 18s   Osteoarthritis Brother    Obesity Brother    Heart attack Sister        In her 14s   Heart attack Father        He died at age 60   Lung cancer Maternal Grandmother        non-smoker   Colon cancer Neg Hx        Mother died at 23 from blood clot, MI    Past Surgical History:  Procedure Laterality Date   ABDOMINAL HYSTERECTOMY     ANTERIOR LAT LUMBAR FUSION  05/13/2012   Procedure: ANTERIOR LATERAL LUMBAR FUSION 2 LEVELS;  Surgeon: Faythe Ghee, MD;  Location: [facility];  Service: Neurosurgery;  Laterality: Left;  Left Lumbar Three-four,Lumbar four-five Extreme Lumbar Interbody Fusion with Percutaneous Pedicle Screws   APPENDECTOMY     BREAST BIOPSY Left ~ 2014   BREAST BIOPSY Right 2015   BREAST LUMPECTOMY Left 1988   BREAST RECONSTRUCTION WITH PLACEMENT OF TISSUE EXPANDER AND FLEX HD (ACELLULAR HYDRATED DERMIS) Right 11/20/2014   BREAST  RECONSTRUCTION WITH PLACEMENT OF TISSUE EXPANDER AND FLEX HD (ACELLULAR HYDRATED DERMIS) Right 11/20/2014   Procedure: RIGHT BREAST RECONSTRUCTION WITH PLACEMENT OF TISSUE EXPANDER AND ACELLULAR DERMA MATRIX (ACELLULAR DERMA MATRIX);  Surgeon: Crissie Reese, MD;  Location: [facility];  Service: Plastics;  Laterality: Right;   CATARACT EXTRACTION W/ INTRAOCULAR LENS  IMPLANT, BILATERAL Bilateral    COLONOSCOPY  Sept 2008   RMR: normal rectum, left-sided diverticula, repeat in 2018   COLONOSCOPY WITH PROPOFOL N/A 03/21/2018   Dr. Gala Romney: diverticulosis, internal grade I hemorrhoids. no future colonoscopies unless develops new symptoms   DIAGNOSTIC LAPAROSCOPY     DILATION AND CURETTAGE OF UTERUS     ESOPHAGOGASTRODUODENOSCOPY     ESOPHAGOGASTRODUODENOSCOPY  2013   Dr. Gala Romney: Cervical esophageal web and Schatzki's ring s/p dilation, small hiatal hernia, antral and duodenal erosions likely NSAID effect, chronic duodenitis on path    ESOPHAGOGASTRODUODENOSCOPY (EGD) WITH PROPOFOL N/A 03/21/2018   Dr. Gala Romney: mild Schatzki ring s/p dilation   FOREIGN BODY REMOVAL Right 10/06/2013   Procedure: REMOVAL FOREIGN BODY EXTREMITY;  Surgeon: Carole Civil, MD;  Location: [facility];  Service: Orthopedics;  Laterality: Right;   INJECTION KNEE Left 10/06/2013   Procedure: KNEE INJECTION;  Surgeon: Carole Civil, MD;  Location: [facility];  Service: Orthopedics;  Laterality: Left;   JOINT REPLACEMENT Bilateral    knees   KNEE ARTHROSCOPY Right    KNEE ARTHROSCOPY WITH LATERAL MENISECTOMY Right 10/06/2013   Procedure: KNEE ARTHROSCOPY WITH LATERAL AND MEDIAL MENISECTOMY;  Surgeon: Carole Civil, MD;  Location: [facility];  Service: Orthopedics;  Laterality: Right;  END @ 1234   lumpectomy on pelvis     "mass of nerves"   Victoria Vera N/A 03/21/2018   Procedure: MALONEY DILATION;  Surgeon: Daneil Dolin, MD;  [facility];  Service: Endoscopy;  Laterality: N/A;   MASTECTOMY COMPLETE / SIMPLE W/ SENTINEL  NODE BIOPSY Right 11/20/2014   axillary   REMOVAL OF TISSUE EXPANDER AND PLACEMENT OF IMPLANT Right 12/13/2014   Procedure: REMOVAL OF RIGHT BREAST TISSUE EXPANDER;  Surgeon: Crissie Reese, MD;  Location: [facility];  Service: Plastics;  Laterality: Right;   ROBOTIC ASSITED PARTIAL NEPHRECTOMY Right 01/08/2016   Procedure: XI ROBOTIC ASSITED PARTIAL NEPHRECTOMY;  Surgeon: Alexis Frock, MD;  Location: [facility];  Service: Urology;  Laterality: Right;   SIMPLE MASTECTOMY WITH AXILLARY SENTINEL NODE BIOPSY Right 11/20/2014   Procedure: SIMPLE MASTECTOMY WITH AXILLARY SENTINEL NODE BIOPSY;  Surgeon: Erroll Luna, MD;  Location: [facility];  Service: General;  Laterality: Right;   TISSUE EXPANDER REMOVAL Right 12/13/2014   dr Harlow Mares   TONSILLECTOMY     TOTAL KNEE ARTHROPLASTY Right 01/24/2014   Procedure: TOTAL KNEE ARTHROPLASTY;  Surgeon: Carole Civil, MD;  [facility];  Service: Orthopedics;  Laterality: Right;   TOTAL KNEE ARTHROPLASTY Left 08/08/2014   Procedure: TOTAL KNEE ARTHROPLASTY;  Surgeon: Carole Civil, MD;  [facility];  Service: Orthopedics;  Laterality: Left;   TUBAL LIGATION     UPPER GASTROINTESTINAL ENDOSCOPY     Social History   Occupational History   Occupation: disability    Employer: UNEMPLOYED    Comment: since 2008, knee surgery  Tobacco Use   Smoking status: Former    Packs/day: 1.00    Years: 27.00    Total pack years: 27.00    Types: Cigarettes    Quit date: 12/26/1985    Years since quitting: 36.6   Smokeless tobacco: Never   Tobacco comments:    Quit in 1987  Vaping Use   Vaping Use: Never used  Substance and Sexual Activity   Alcohol use: Yes    Alcohol/week: 0.0 standard drinks of alcohol    Comment: occasional wine   Drug use: No   Sexual activity: Yes    Birth control/protection: Surgical

## 2022-08-02 ENCOUNTER — Other Ambulatory Visit: Payer: Self-pay | Admitting: Cardiovascular Disease

## 2022-08-09 DIAGNOSIS — G4733 Obstructive sleep apnea (adult) (pediatric): Secondary | ICD-10-CM | POA: Diagnosis not present

## 2022-09-01 ENCOUNTER — Telehealth: Payer: Self-pay | Admitting: Orthopedic Surgery

## 2022-09-01 NOTE — Telephone Encounter (Signed)
Patient lvm stating she was waiting on a referral to see Dr. Trenton Gammon.  Pt's # [phone]

## 2022-09-02 NOTE — Telephone Encounter (Signed)
She should be able to call and schedule if she has seen him before, so we did not put in a referral I called her to give her the number [phone]   She will let  us know if she has any problems.

## 2022-09-08 DIAGNOSIS — G4733 Obstructive sleep apnea (adult) (pediatric): Secondary | ICD-10-CM | POA: Diagnosis not present

## 2022-10-09 DIAGNOSIS — G4733 Obstructive sleep apnea (adult) (pediatric): Secondary | ICD-10-CM | POA: Diagnosis not present

## 2022-10-13 DIAGNOSIS — I10 Essential (primary) hypertension: Secondary | ICD-10-CM | POA: Diagnosis not present

## 2022-10-13 DIAGNOSIS — R600 Localized edema: Secondary | ICD-10-CM | POA: Diagnosis not present

## 2022-10-13 DIAGNOSIS — G4733 Obstructive sleep apnea (adult) (pediatric): Secondary | ICD-10-CM | POA: Diagnosis not present

## 2022-10-13 DIAGNOSIS — E039 Hypothyroidism, unspecified: Secondary | ICD-10-CM | POA: Diagnosis not present

## 2022-10-13 DIAGNOSIS — N3281 Overactive bladder: Secondary | ICD-10-CM | POA: Diagnosis not present

## 2022-10-13 DIAGNOSIS — M545 Low back pain, unspecified: Secondary | ICD-10-CM | POA: Diagnosis not present

## 2022-10-13 DIAGNOSIS — R202 Paresthesia of skin: Secondary | ICD-10-CM | POA: Diagnosis not present

## 2022-10-18 ENCOUNTER — Other Ambulatory Visit: Payer: Self-pay | Admitting: Orthopedic Surgery

## 2022-10-22 ENCOUNTER — Ambulatory Visit (HOSPITAL_COMMUNITY)
Admission: RE | Admit: 2022-10-22 | Discharge: 2022-10-22 | Disposition: A | Discharge status: Home / Self Care | Payer: Medicare Other | Source: Ambulatory Visit | Attending: Physician Assistant | Admitting: Physician Assistant

## 2022-10-22 ENCOUNTER — Inpatient Hospital Stay: Payer: Medicare Other | Attending: Physician Assistant

## 2022-10-22 ENCOUNTER — Ambulatory Visit (HOSPITAL_COMMUNITY): Payer: Medicare Other

## 2022-10-22 DIAGNOSIS — Z85528 Personal history of other malignant neoplasm of kidney: Secondary | ICD-10-CM | POA: Diagnosis not present

## 2022-10-22 DIAGNOSIS — R600 Localized edema: Secondary | ICD-10-CM | POA: Insufficient documentation

## 2022-10-22 DIAGNOSIS — Z7982 Long term (current) use of aspirin: Secondary | ICD-10-CM | POA: Diagnosis not present

## 2022-10-22 DIAGNOSIS — G8929 Other chronic pain: Secondary | ICD-10-CM | POA: Diagnosis not present

## 2022-10-22 DIAGNOSIS — G479 Sleep disorder, unspecified: Secondary | ICD-10-CM | POA: Insufficient documentation

## 2022-10-22 DIAGNOSIS — R0609 Other forms of dyspnea: Secondary | ICD-10-CM | POA: Insufficient documentation

## 2022-10-22 DIAGNOSIS — Z1231 Encounter for screening mammogram for malignant neoplasm of breast: Secondary | ICD-10-CM | POA: Insufficient documentation

## 2022-10-22 DIAGNOSIS — Z905 Acquired absence of kidney: Secondary | ICD-10-CM | POA: Insufficient documentation

## 2022-10-22 DIAGNOSIS — E559 Vitamin D deficiency, unspecified: Secondary | ICD-10-CM | POA: Diagnosis not present

## 2022-10-22 DIAGNOSIS — Z79899 Other long term (current) drug therapy: Secondary | ICD-10-CM | POA: Insufficient documentation

## 2022-10-22 DIAGNOSIS — C50411 Malignant neoplasm of upper-outer quadrant of right female breast: Secondary | ICD-10-CM | POA: Insufficient documentation

## 2022-10-22 DIAGNOSIS — Z87891 Personal history of nicotine dependence: Secondary | ICD-10-CM | POA: Insufficient documentation

## 2022-10-22 DIAGNOSIS — R42 Dizziness and giddiness: Secondary | ICD-10-CM | POA: Diagnosis not present

## 2022-10-22 DIAGNOSIS — C50412 Malignant neoplasm of upper-outer quadrant of left female breast: Secondary | ICD-10-CM | POA: Insufficient documentation

## 2022-10-22 DIAGNOSIS — I890 Lymphedema, not elsewhere classified: Secondary | ICD-10-CM | POA: Insufficient documentation

## 2022-10-22 DIAGNOSIS — R519 Headache, unspecified: Secondary | ICD-10-CM | POA: Diagnosis not present

## 2022-10-22 DIAGNOSIS — R5383 Other fatigue: Secondary | ICD-10-CM | POA: Insufficient documentation

## 2022-10-22 DIAGNOSIS — Z86000 Personal history of in-situ neoplasm of breast: Secondary | ICD-10-CM | POA: Diagnosis not present

## 2022-10-22 DIAGNOSIS — R200 Anesthesia of skin: Secondary | ICD-10-CM | POA: Diagnosis not present

## 2022-10-22 DIAGNOSIS — Z9011 Acquired absence of right breast and nipple: Secondary | ICD-10-CM | POA: Diagnosis not present

## 2022-10-22 LAB — COMPREHENSIVE METABOLIC PANEL
ALT: 28 U/L (ref 0–44)
AST: 43 U/L — ABNORMAL HIGH (ref 15–41)
Albumin: 2.2 g/dL — ABNORMAL LOW (ref 3.5–5.0)
Alkaline Phosphatase: 77 U/L (ref 38–126)
Anion gap: 8 (ref 5–15)
BUN: 19 mg/dL (ref 8–23)
CO2: 26 mmol/L (ref 22–32)
Calcium: 7.8 mg/dL — ABNORMAL LOW (ref 8.9–10.3)
Chloride: 101 mmol/L (ref 98–111)
Creatinine, Ser: 1.05 mg/dL — ABNORMAL HIGH (ref 0.44–1.00)
GFR, Estimated: 55 mL/min — ABNORMAL LOW (ref >60)
Glucose, Bld: 103 mg/dL — ABNORMAL HIGH (ref 70–99)
Potassium: 3.3 mmol/L — ABNORMAL LOW (ref 3.5–5.1)
Sodium: 135 mmol/L (ref 135–145)
Total Bilirubin: 0.3 mg/dL (ref 0.3–1.2)
Total Protein: 5.3 g/dL — ABNORMAL LOW (ref 6.5–8.1)

## 2022-10-22 LAB — LACTATE DEHYDROGENASE: LDH: 206 U/L — ABNORMAL HIGH (ref 98–192)

## 2022-10-22 LAB — CBC WITH DIFFERENTIAL/PLATELET
Abs Immature Granulocytes: 0.01 10*3/uL (ref 0.00–0.07)
Basophils Absolute: 0.1 10*3/uL (ref 0.0–0.1)
Basophils Relative: 1 %
Eosinophils Absolute: 0.2 10*3/uL (ref 0.0–0.5)
Eosinophils Relative: 2 %
HCT: 38.9 % (ref 36.0–46.0)
Hemoglobin: 13.1 g/dL (ref 12.0–15.0)
Immature Granulocytes: 0 %
Lymphocytes Relative: 46 %
Lymphs Abs: 3.1 10*3/uL (ref 0.7–4.0)
MCH: 32.4 pg (ref 26.0–34.0)
MCHC: 33.7 g/dL (ref 30.0–36.0)
MCV: 96.3 fL (ref 80.0–100.0)
Monocytes Absolute: 0.6 10*3/uL (ref 0.1–1.0)
Monocytes Relative: 8 %
Neutro Abs: 2.9 10*3/uL (ref 1.7–7.7)
Neutrophils Relative %: 43 %
Platelets: 226 10*3/uL (ref 150–400)
RBC: 4.04 MIL/uL (ref 3.87–5.11)
RDW: 13.4 % (ref 11.5–15.5)
WBC: 6.9 10*3/uL (ref 4.0–10.5)
nRBC: 0 % (ref 0.0–0.2)

## 2022-10-23 ENCOUNTER — Telehealth: Payer: Self-pay | Admitting: General Practice

## 2022-10-23 LAB — MISC LABCORP TEST (SEND OUT): Labcorp test code: 81950

## 2022-10-23 NOTE — Telephone Encounter (Signed)
Spoke with pt regarding increased swelling of her legs, from her knees down. Pt states that her feet get so swollen that it's hurts to walk on. Pt also states that she gets short of breath when walking do the hall to the bathroom. Per Dr. Evette Georges note on 07/01/22 pt should be taking torsemide '40mg'$  every other day and '20mg'$  every other day alternating. Pt states that she is taking toresmide '40mg'$  every day. Pt mentions that she is wearing her compression stockings but they are only helping a little. Able to schedule pt sooner appointment with Laurann Montana, NP. Discussed ED precautions with pt. Pt verbalizes understanding.

## 2022-10-23 NOTE — Telephone Encounter (Signed)
Loel Dubonnet, NP  You46 minutes ago (3:21 PM)    Agree with plan for OV. She may take Torsemide 40 mg BID x 2 days then return to 40 mg daily. Continue to wear compression socks and elevate legs as much as possible while sitting.  However, she recently had labs with potassium mildly low. Per her medication list takes Potassium 23mq daily.  If another provider has not already adjusted her potassium based on labs I would recommend the following: Increase to 20 mEQ (2 tabs) twice daily for 3 days then return to 10 mEQ daily.   Spoke with pt regarding Caitlin's recommendations. She repeats back to me instructions for increasing toresemide and potassium. Pt verbalizes understanding.

## 2022-10-23 NOTE — Telephone Encounter (Signed)
Pt c/o swelling: STAT is pt has developed SOB within 24 hours  If swelling, where is the swelling located? Knees, legs, ankles and feet  How much weight have you gained and in what time span?   Have you gained 3 pounds in a day or 5 pounds in a week?   Do you have a log of your daily weights (if so, list)?   Are you currently taking a fluid pill? yes  Are you currently SOB? Very short of breath when she moves around  Have you traveled recently? No- patient wanted to be seen- first available appointment I had available was 11-03-22 with Finis Bud- please call to evaluate

## 2022-10-26 DIAGNOSIS — C50911 Malignant neoplasm of unspecified site of right female breast: Secondary | ICD-10-CM | POA: Diagnosis not present

## 2022-10-27 ENCOUNTER — Encounter (HOSPITAL_BASED_OUTPATIENT_CLINIC_OR_DEPARTMENT_OTHER): Payer: Self-pay | Admitting: Family

## 2022-10-27 ENCOUNTER — Ambulatory Visit (HOSPITAL_BASED_OUTPATIENT_CLINIC_OR_DEPARTMENT_OTHER): Payer: Medicare Other | Admitting: Family

## 2022-10-27 VITALS — BP 104/62 | HR 61 | Ht 66.0 in | Wt 223.0 lb

## 2022-10-27 DIAGNOSIS — R600 Localized edema: Secondary | ICD-10-CM

## 2022-10-27 DIAGNOSIS — I10 Essential (primary) hypertension: Secondary | ICD-10-CM | POA: Diagnosis not present

## 2022-10-27 DIAGNOSIS — I471 Supraventricular tachycardia, unspecified: Secondary | ICD-10-CM | POA: Diagnosis not present

## 2022-10-27 DIAGNOSIS — R002 Palpitations: Secondary | ICD-10-CM | POA: Diagnosis not present

## 2022-10-27 DIAGNOSIS — R2243 Localized swelling, mass and lump, lower limb, bilateral: Secondary | ICD-10-CM | POA: Diagnosis not present

## 2022-10-27 DIAGNOSIS — G4733 Obstructive sleep apnea (adult) (pediatric): Secondary | ICD-10-CM

## 2022-10-27 NOTE — Patient Instructions (Addendum)
Medication Instructions:  Continue current medications.   *If you need a refill on your cardiac medications before your next appointment, please call your pharmacy*   Lab Work: Your physician recommends that you return for lab work today: BMP, magnesium, BNP  If you have labs (blood work) drawn today and your tests are completely normal, you will receive your results only by: MyChart Message (if you have MyChart) OR A paper copy in the mail If you have any lab test that is abnormal or we need to change your treatment, we will call you to review the results.   Testing/Procedures: Your physician has requested that you have an echocardiogram. Echocardiography is a painless test that uses sound waves to create images of your heart. It provides your doctor with information about the size and shape of your heart and how well your heart's chambers and valves are working. This procedure takes approximately one hour. There are no restrictions for this procedure. Please do NOT wear cologne, perfume, aftershave, or lotions (deodorant is allowed). Please arrive 15 minutes prior to your appointment time.    Follow-Up: At [facility], you and your health needs are our priority.  As part of our continuing mission to provide you with exceptional heart care, we have created designated Provider Care Teams.  These Care Teams include your primary Cardiologist (physician) and Advanced Practice Providers (APPs -  Physician Assistants and Nurse Practitioners) who all work together to provide you with the care you need, when you need it.  We recommend signing up for the patient portal called "MyChart".  Sign up information is provided on this After Visit Summary.  MyChart is used to connect with patients for Virtual Visits (Telemedicine).  Patients are able to view lab/test results, encounter notes, upcoming appointments, etc.  Non-urgent messages can be sent to your provider as well.   To learn more  about what you can do with MyChart, go to [email or web address].    Your next appointment:   6 week(s)  Provider:   Shelva Majestic, MD  or Loel Dubonnet, NP    Other Instructions  To prevent or reduce lower extremity swelling: Eat a low salt diet. Salt makes the body hold onto extra fluid which causes swelling. Sit with legs elevated. For example, in the recliner or on an [place].  Wear knee-high compression stockings during the daytime. Ones labeled 15-20 mmHg provide good compression.

## 2022-10-27 NOTE — Progress Notes (Signed)
Office Visit    Patient Name: Sara [name] Date of Encounter: 10/27/2022  PCP:  [name], [place]  Cardiologist:  Shelva Majestic, MD  Advanced Practice Provider:  No care team member to display Electrophysiologist:  None      Chief Complaint    Sara [name] is a 77 y.o. female presents today for swelling, shortness of breath   Past Medical History    Past Medical History:  Diagnosis Date   Allergic rhinitis due to pollen    Anxiety    Arthritis    "knees, back, right elbow; left shoulder; right ankle" (11/20/2014)   Breast cancer, right breast ([place]) 2016   "DCIS; zero stage" S/P mastectomy, cancer free since then   Cervical spondylosis without myelopathy    Cervicalgia    Chronic lower back pain    Constipation    takes Miralax daily   Degeneration of cervical intervertebral disc    Depression    Diverticulosis of colon (without mention of hemorrhage)    GERD (gastroesophageal reflux disease)    takes Omeprazole daily   Graves' disease    "took a pill to correct"   H/O hiatal hernia    H/O urinary frequency    Headache(784.0)    denies migraines since the 80's but has occ and takes Butalbital prn   HTN (hypertension)    takes Metoprolol and Lisinopril daily   Insomnia    takes Ativan nigtly   Joint pain    "all of them"   Joint swelling    "knees, legs, ankles sometimes" (11/20/2014)   Morbid obesity ([place])    Numbness    LOWER LEGS   Other postablative hypothyroidism    takes SYnthroid daily   Palpitations    Peptic ulcer, unspecified site, unspecified as acute or chronic, without mention of hemorrhage, perforation, or obstruction    Primary localized osteoarthrosis, lower leg    Pure hypercholesterolemia    takes Pravastin daily   Recurrent UTI    Renal cell carcinoma ([place]) 01/08/2016   Right renal mass s/p partial right nephrectomy, cancer free since 2017   Shortness of breath    Sleep apnea    slight but  doesn't require a CPAP (11/20/2014)   Tendonitis of knee    bilateral   Past Surgical History:  Procedure Laterality Date   ABDOMINAL HYSTERECTOMY     ANTERIOR LAT LUMBAR FUSION  05/13/2012   Procedure: ANTERIOR LATERAL LUMBAR FUSION 2 LEVELS;  Surgeon: Faythe Ghee, MD;  Location: [facility];  Service: Neurosurgery;  Laterality: Left;  Left Lumbar Three-four,Lumbar four-five Extreme Lumbar Interbody Fusion with Percutaneous Pedicle Screws   APPENDECTOMY     BREAST BIOPSY Left ~ 2014   BREAST BIOPSY Right 2015   BREAST LUMPECTOMY Left 1988   BREAST RECONSTRUCTION WITH PLACEMENT OF TISSUE EXPANDER AND FLEX HD (ACELLULAR HYDRATED DERMIS) Right 11/20/2014   BREAST RECONSTRUCTION WITH PLACEMENT OF TISSUE EXPANDER AND FLEX HD (ACELLULAR HYDRATED DERMIS) Right 11/20/2014   Procedure: RIGHT BREAST RECONSTRUCTION WITH PLACEMENT OF TISSUE EXPANDER AND ACELLULAR DERMA MATRIX (ACELLULAR DERMA MATRIX);  Surgeon: Crissie Reese, MD;  Location: [facility];  Service: Plastics;  Laterality: Right;   CATARACT EXTRACTION W/ INTRAOCULAR LENS  IMPLANT, BILATERAL Bilateral    COLONOSCOPY  Sept 2008   RMR: normal rectum, left-sided diverticula, repeat in 2018   COLONOSCOPY WITH PROPOFOL N/A 03/21/2018   Dr. Gala Romney: diverticulosis, internal grade I hemorrhoids. no future colonoscopies  unless develops new symptoms   DIAGNOSTIC LAPAROSCOPY     DILATION AND CURETTAGE OF UTERUS     ESOPHAGOGASTRODUODENOSCOPY     ESOPHAGOGASTRODUODENOSCOPY  2013   Dr. Gala Romney: Cervical esophageal web and Schatzki's ring s/p dilation, small hiatal hernia, antral and duodenal erosions likely NSAID effect, chronic duodenitis on path    ESOPHAGOGASTRODUODENOSCOPY (EGD) WITH PROPOFOL N/A 03/21/2018   Dr. Gala Romney: mild Schatzki ring s/p dilation   FOREIGN BODY REMOVAL Right 10/06/2013   Procedure: REMOVAL FOREIGN BODY EXTREMITY;  Surgeon: Carole Civil, MD;  Location: [facility];  Service: Orthopedics;  Laterality: Right;   INJECTION KNEE Left 10/06/2013    Procedure: KNEE INJECTION;  Surgeon: Carole Civil, MD;  Location: [facility];  Service: Orthopedics;  Laterality: Left;   JOINT REPLACEMENT Bilateral    knees   KNEE ARTHROSCOPY Right    KNEE ARTHROSCOPY WITH LATERAL MENISECTOMY Right 10/06/2013   Procedure: KNEE ARTHROSCOPY WITH LATERAL AND MEDIAL MENISECTOMY;  Surgeon: Carole Civil, MD;  Location: [facility];  Service: Orthopedics;  Laterality: Right;  END @ 1234   lumpectomy on pelvis     "mass of nerves"   [place] N/A 03/21/2018   Procedure: MALONEY DILATION;  Surgeon: Daneil Dolin, MD;  Location: [facility];  Service: Endoscopy;  Laterality: N/A;   MASTECTOMY COMPLETE / SIMPLE W/ SENTINEL NODE BIOPSY Right 11/20/2014   axillary   REMOVAL OF TISSUE EXPANDER AND PLACEMENT OF IMPLANT Right 12/13/2014   Procedure: REMOVAL OF RIGHT BREAST TISSUE EXPANDER;  Surgeon: Crissie Reese, MD;  Location: [facility];  Service: Plastics;  Laterality: Right;   ROBOTIC ASSITED PARTIAL NEPHRECTOMY Right 01/08/2016   Procedure: XI ROBOTIC ASSITED PARTIAL NEPHRECTOMY;  Surgeon: Alexis Frock, MD;  Location: [facility];  Service: Urology;  Laterality: Right;   SIMPLE MASTECTOMY WITH AXILLARY SENTINEL NODE BIOPSY Right 11/20/2014   Procedure: SIMPLE MASTECTOMY WITH AXILLARY SENTINEL NODE BIOPSY;  Surgeon: Erroll Luna, MD;  Location: [facility];  Service: General;  Laterality: Right;   TISSUE EXPANDER REMOVAL Right 12/13/2014   dr Harlow Mares   TONSILLECTOMY     TOTAL KNEE ARTHROPLASTY Right 01/24/2014   Procedure: TOTAL KNEE ARTHROPLASTY;  Surgeon: Carole Civil, MD;  Location: [facility];  Service: Orthopedics;  Laterality: Right;   TOTAL KNEE ARTHROPLASTY Left 08/08/2014   Procedure: TOTAL KNEE ARTHROPLASTY;  Surgeon: Carole Civil, MD;  Location: [facility];  Service: Orthopedics;  Laterality: Left;   TUBAL LIGATION     UPPER GASTROINTESTINAL ENDOSCOPY      Allergies  Allergies  Allergen Reactions   Propoxyphene Hcl Other (See Comments)      Hallucinations   Pollen Extract Other (See Comments)    Sneezing, congestion   Camphor Itching   Dust Mite Extract Itching    Sneezing. Runny nose    Latex Dermatitis and Rash   Molds & Smuts Itching   Tomato Hives    History of Present Illness    Sara [name] is a 77 y.o. female with a hx of OSA on CPAP, HTN, HLD, SVT, CAD last seen 07/01/22 by Dr. Claiborne Billings.  Prior cardiac testing includes: CTA 02/2020 with  coronary calcium score of 94, mid RCA 50-74% steonsed with normal FFR, mid LAD 0-25%. Echo 11/2021 normal LVEF 55-60%, gr1dd, RV normal, small pericardial effusion (similar to previous), trivial MR. Monitor 02/20/22 with 2 episodes of NSVT (longest 14 beats) and 56 episodes of SVT (longest 57 seconds).   Last saw Dr. Claiborne Billings 07/01/22 with hypotension (92/58).  Her Metoprolol was reduced to '25mg'$  AM and 12.'5mg'$  PM. Her edema was improved and Torsemide reduced to '20mg'$  and '40mg'$  QOD.  Called the office 10/23/22 noting increased edema. She was recommended to increase Torsemide to '40mg'$  twice daily for 2 days then return to '40mg'$  daily. Her potassium was low and recommended for 39mq BID x 3 days then 118m daily.   She presents today for shortness of breath and swelling with her husband. She notes her lower extremity edema is predominantly in her ankles and has improved some since increased dose Torsemide but did not resolve completely. She did have good urinary output. She has been having SOB for the last 10 days and it is lasting all throughout the day.  When her SOB happens at rest her heart rate increases where she says "it feels like its up in my mouth". No exertional chest discomfort. Of note, she has had recent adjustment of thyroid medications and unintentionally took a supplement that affected her thyroid levels. She wears a CPAP machine regularly. She is also experiencing lightheadedness and she has been close to falling due to feeling unsteady on her feet - this has been a longstanding issue  per her report. Spends most of her day in her recliner.   EKGs/Labs/Other Studies Reviewed:   The following studies were reviewed today:   EKG:  EKG is not ordered today.    Recent Labs: 12/26/2021: BNP 19.4 10/22/2022: ALT 28; BUN 19; Creatinine, Ser 1.05; Hemoglobin 13.1; Platelets 226; Potassium 3.3; Sodium 135  Recent Lipid Panel    Component Value Date/Time   CHOL 141 06/07/2020 0833   TRIG 71 06/07/2020 0833   HDL 57 06/07/2020 0833   CHOLHDL 2.5 06/07/2020 0833   VLDL 14 03/06/2020 0420   LDLCALC 69 06/07/2020 0833    Home Medications   Current Meds  Medication Sig   acetaminophen (TYLENOL) 500 MG tablet Take 650 mg by mouth in the morning and at bedtime.   aspirin EC 81 MG tablet Take 81 mg by mouth daily. Swallow whole.   diphenhydrAMINE (BENADRYL) 50 MG capsule Take 50 mg by mouth at bedtime.   gabapentin (NEURONTIN) 600 MG tablet TAKE 1 TABLET BY MOUTH TWICE  DAILY   levothyroxine (SYNTHROID) 100 MCG tablet Take 100 mcg by mouth daily.   metoprolol tartrate (LOPRESSOR) 25 MG tablet Take 25 mg ( 1 tablet ) in morning and take ( 1/2  tablet ) 12.5 mg at night   Multiple Vitamins-Minerals (CENTRUM SILVER PO) Take 1 tablet by mouth daily.   omeprazole (PRILOSEC) 20 MG capsule Take 20 mg by mouth daily. May take a second 20 mg dose as needed for heartburn   oxybutynin (DITROPAN) 5 MG tablet Take 5 mg by mouth 2 (two) times daily.    potassium chloride (KLOR-CON) 10 MEQ tablet TAKE 1 TABLET BY MOUTH DAILY   torsemide (DEMADEX) 20 MG tablet Take 2 tablets ( 40 mg ) alternating 1 tablet ( 20 mg ) every other day   Vitamin D, Ergocalciferol, (DRISDOL) 1.25 MG (50000 UNIT) CAPS capsule TAKE 1 CAPSULE BY MOUTH WEEKLY     Review of Systems      All other systems reviewed and are otherwise negative except as noted above.  Physical Exam    VS:  BP 104/62   Pulse 61   Ht '5\' 6"'$  (1.676 m)   Wt 223 lb (101.2 kg)   BMI 35.99 kg/m  , BMI Body mass index is 35.99 kg/m.  Wt  Readings from Last 3 Encounters:  10/27/22 223 lb (101.2 kg)  07/27/22 216 lb (98 kg)  07/17/22 216 lb (98 kg)    GEN: Well nourished, well developed, in no acute distress. HEENT: normal. Neck: Supple, no JVD, carotid bruits, or masses. Cardiac: RRR, no murmurs, rubs, or gallops. No clubbing, cyanosis. Bilateral 1+ pretibial edema.  Radials/PT 2+ and equal bilaterally.  Respiratory:  Respirations regular and unlabored, clear to auscultation bilaterally. GI: Soft, nontender, nondistended. MS: No deformity or atrophy. Skin: Warm and dry, no rash. Neuro:  Strength and sensation are intact. Psych: Normal affect.  Assessment & Plan    LE edema - Bilateral 1+ pretibial edema improved since short course increased Torsemide. Wears compression stockings. Notes exertional dyspnea. Plan for echo to assess for reduced LVEF. Continue Torsemide '40mg'$  QD. BMP, mag, BNP today. Adjust diuresis pending lab results - could consider Spironolactone with careful monitoring of BP given previous hypokalemia. Venous insufficiency likely contributory.   Hypokalemia - Recently completed increased dose potassium. Update BMP, magnesium today.   SVT / Palpitations - Recent increase in severity in setting of abnormal TSH, followed by PCP. BMP, magnesium, BNP. Could consider change in metoprolol pending labs. Patient to take an extra half tablet of metoprolol when experiencing palpitations. 10/22/22 Hb 13.1 no evidence of anemia. Echo as above to rule out valvular abnormality.   HTN - Today's bp 104/62.  Now with relative hypotension. Metoprolol dose previously reduced due to hypotension, recommend monitor BP at home.   Hypothyroidism - 09/2022 TSH 23 via [facility] in setting of taking over the counter supplement. Managed by her primary care provider. Notes her dose was last changed in December. Has repeat thyroid labs in February.  OSA - CPAP compliance encouraged.   BMI 35 / Obesity - Weight loss via diet and exercise  encouraged. Discussed the impact being overweight would have on cardiovascular risk. Refer to [facility] at [facility].        Disposition: Follow up in 2 month(s) with Shelva Majestic, MD or APP.  Signed, Loel Dubonnet, NP 10/27/2022, 3:32 PM [facility]

## 2022-10-28 ENCOUNTER — Telehealth (HOSPITAL_BASED_OUTPATIENT_CLINIC_OR_DEPARTMENT_OTHER): Payer: Self-pay | Admitting: *Deleted

## 2022-10-28 DIAGNOSIS — I10 Essential (primary) hypertension: Secondary | ICD-10-CM

## 2022-10-28 DIAGNOSIS — Z5181 Encounter for therapeutic drug level monitoring: Secondary | ICD-10-CM

## 2022-10-28 LAB — BASIC METABOLIC PANEL
BUN/Creatinine Ratio: 18 (ref 12–28)
BUN: 18 mg/dL (ref 8–27)
CO2: 31 mmol/L — ABNORMAL HIGH (ref 20–29)
Calcium: 8.7 mg/dL (ref 8.7–10.3)
Chloride: 100 mmol/L (ref 96–106)
Creatinine, Ser: 1 mg/dL (ref 0.57–1.00)
Glucose: 81 mg/dL (ref 70–99)
Potassium: 4.2 mmol/L (ref 3.5–5.2)
Sodium: 143 mmol/L (ref 134–144)
eGFR: 59 mL/min/{1.73_m2} — ABNORMAL LOW (ref >59)

## 2022-10-28 LAB — BRAIN NATRIURETIC PEPTIDE: BNP: 33 pg/mL (ref 0.0–100.0)

## 2022-10-28 LAB — MAGNESIUM: Magnesium: 2.2 mg/dL (ref 1.6–2.3)

## 2022-10-28 MED ORDER — SPIRONOLACTONE 25 MG PO TABS: ORAL_TABLET | ORAL | 1 refills | Status: DC

## 2022-10-28 NOTE — Telephone Encounter (Signed)
-----   Message from Loel Dubonnet, NP sent at 10/28/2022  3:53 PM EST ----- Normal kidney function. Normal electrolytes including potassium and magnesium. BNP with no significant volume overload.   However, she did have still ankle edema on exam in clinic. Continue Torsemide '40mg'$  QD. Start Spironolactone 12.'5mg'$  QD. Stop potassium tablet as Spironolactone will help to raise potassium.  Repeat BMP in one week for monitoring.

## 2022-10-28 NOTE — Telephone Encounter (Signed)
Advised patient of lab results Rx sent to pharmacy and gave addresses of [facility] in [place]

## 2022-11-02 ENCOUNTER — Telehealth: Payer: Self-pay | Admitting: *Deleted

## 2022-11-02 NOTE — Telephone Encounter (Signed)
Contacted regarding PREP Class referral. She is interested in participating at the [facility].. Class to begin 12/22/2022 Tuesday/Thursday [id number]. I will call back in March to schedule intake assessment visit.

## 2022-11-03 ENCOUNTER — Ambulatory Visit: Payer: Medicare Other | Admitting: Nurse Practitioner

## 2022-11-04 NOTE — Progress Notes (Unsigned)
[place], [place] [address]   CLINIC:  Medical Oncology/Hematology  PCP:  [name], [address] / [place] [address] [phone]   REASON FOR VISIT:  Follow-up for history of right breast DCIS/LCIS   PRIOR THERAPY: - Right simple mastectomy (11/20/2014) - Tamoxifen (01/01/2015 through 01/19/2020)   CURRENT THERAPY: Surveillance  BRIEF ONCOLOGIC HISTORY:   Oncology History  Malignant neoplasm of upper outer quadrant of female breast ([facility])  09/05/2014 Imaging   assymetry noted in R breast on screening mammogram   09/18/2014 Imaging   Irregular mass in the 12 o'clock location of the right breast. Tissue diagnosis is recommended.   09/18/2014 Imaging   IMPRESSION: Irregular mass in the 12 o'clock location of the right breast. Tissue diagnosis is recommended.   10/09/2014 Imaging   8 x 12 x 8 CM abnormal linear clumped enhancement throughout the majority of the central and upper right breast.   10/17/2014 Initial Biopsy   Breast, right, needle core biopsy, central - DUCTAL CARCINOMA IN SITU, MIXED PATTERNS (CRIBRIFORM, COMEDO AND PAPILLARY), INVOLVING MULTIPLE CORES, AT LEAST 9 MM IN MAXIMAL EXTENT IN ANY ONE CORE. - FOCAL LOBULAR NEOPLASIA (ALH/LCIS). - NO INVASIVE CAR   11/20/2014 Definitive Surgery   Breast, simple mastectomy, right - DUCTAL CARCINOMA IN SITU, SEE COMMENT. - LOBULAR CARCINOMA IN SITU (CONVENTIONAL AND PLEOMORPHIC TYPES). - PREVIOUS BIOPSY SITES (X2). - IN SITU CARCINOMA IS 3CM FROM NEAREST MARGIN (DEEP).   01/01/2015 -  Anti-estrogen oral therapy   Tamoxifen daily.   07/21/2016 Breast US   BIRADS 4   07/29/2016 Procedure   Ultrasound guided biopsy of right axillary mass/lymph node. No apparent complications.   07/30/2016 Pathology Results   Pathology of the right axillary lymph node biopsy revealed LYMPHOID TISSUE WITH DERMATOPATHIC CHANGE. NO METASTATIC CARCINOMA IDENTIFIED.    Renal cell carcinoma  ([facility])  01/08/2016 Procedure   Robotic-assisted laparoscopic right partial nephrectomy by Dr. Tresa Moore.   01/08/2016 Pathology Results   Kidney, wedge excision / partial resection, right renal mass RENAL CELL CARCINOMA, CLEAR CELL TYPE, WHO NUCLEAR GRADE 2 (2.4 CM) THE TUMOR IS CONFINED TO THE KIDNEY MARGINS OF RESECTION ARE NEGATIVE FOR TUMOR     CANCER STAGING: Cancer Staging  Malignant neoplasm of upper outer quadrant of female breast ([facility] [place]) Staging form: Breast, AJCC 7th Edition - Clinical: Stage 0 (Tis (DCIS), N0, M0) - Signed by [name], MD on 10/11/2014  Renal cell carcinoma [facility]) Staging form: Kidney, AJCC 7th Edition - Clinical stage from 06/02/2016: Stage Unknown (T1a, NX, M0) - Signed by [name] Cancer, PA-C on 06/02/2016   INTERVAL HISTORY:   Sara [name], a 77 y.o. female, returns for routine follow-up of her history of right breast DCIS/LCIS. Sara [name] was last seen on 11/05/2021 by Tarri Abernethy PA-C.   At today's visit, she  reports feeling fair.  She denies any recent hospitalizations, surgeries, or changes in her  baseline health status.  She reports chronic right arm lymphedema.  She was previously seen at the lymphedema clinic, but chose not follow-up due to cost.  She has intermittent right chest wall pain that "feels like muscle strain," it is worsened with movement of her right arm.  She reports that her right arm falls asleep when she sleeps on that shoulder.  She has some numbness in her right axillary region ever since surgery.  She also reports that she has some left breast tenderness that comes  and goes, located at the site of her prior left breast biopsy (circa 2014).   She reports ongoing dyspnea on exertion and bilateral lower extremity edema, is following closely with cardiology.  She admits to fatigue and chronic generalized pain.  She has not noticed any new lumps or bumps.  She denies any new neurologic deficits or seizures.  No B symptoms  such as fever, chills, night sweats, unintentional weight loss.   She reports 30% energy and 100% appetite.  She is maintaining stable weight at this time.   ASSESSMENT & PLAN:  1.  Right breast DCIS/LCIS: - Diagnosed in December 2015 due to abnormal screening mammogram - Status post right simple mastectomy on 11/20/2014, lymph node biopsy negative.  Grade 2, 10 cm, ER/PR positive. -Tamoxifen started on 01/01/2015.  She completed 5 years.  She stopped the tamoxifen on 01/19/2020. - She also has history of left breast biopsy around 2014 - Most recent unilateral left breast screening mammogram (10/22/2022) did not show any evidence of malignancy (BI-RADS Category 1, negative) - Most recent labs (10/22/2022): CBC normal, CMP at baseline, with minimally elevated AST 43 in the setting of fatty liver disease.  LDH mildly elevated at 206.   - Physical exam did not reveal any discrete nodules or masses in left breast or right chest wall.   There is dense scar tissue at the site of prior left breast biopsy/lumpectomy, which is tender to palpation.  No axillary, supraclavicular, or pectoral lymphadenopathy. - No "red flag" symptoms per patient history - PLAN: Repeat annual labs, mammogram, and office visit with physical exam in 1 year. - Since patient was unable to complete treatment at lymphedema clinic due to financial constraints, will reach out to social worker to see if we have any additional resources to offer.   2.  Right renal cell carcinoma: - She had a T1 a NX RCC which was treated with partial right nephrectomy (01/08/2016). - Last CT scan on 10/01/2017 showed no recurrence.  Hepatic steatosis was seen. - January 2021 she had repeat CT scans which was reportedly normal.  She was released by Dr. Tresa Moore (urologist)   3.  Vitamin D deficiency: - Labs done on 10/22/2022 show a vitamin D level of 40.4. - She is taking 50,000 units weekly of vitamin D. - PLAN: Continue vitamin D 50,000 units weekly.      PLAN SUMMARY: >> Labs in 1 year (CBC/D, CMP, vitamin D) >> Mammogram in 1 year >> OFFICE visit in 1 year (1 week after labs/mammogram)    REVIEW OF SYSTEMS:   Review of Systems  Constitutional:  Positive for fatigue. Negative for appetite change, chills, diaphoresis, fever and unexpected weight change.  HENT:   Negative for lump/mass and nosebleeds.   Eyes:  Negative for eye problems.  Respiratory:  Positive for shortness of breath (with exertion). Negative for cough and hemoptysis.   Cardiovascular:  Positive for chest pain (musculoskeletal chest pain), leg swelling and palpitations.  Gastrointestinal:  Negative for abdominal pain, blood in stool, constipation, diarrhea, nausea and vomiting.  Genitourinary:  Positive for hematuria (isolated episode x 1 day).   Musculoskeletal:  Positive for arthralgias, back pain and myalgias.  Skin: Negative.   Neurological:  Positive for dizziness, headaches and numbness. Negative for light-headedness.  Hematological:  Does not bruise/bleed easily.  Psychiatric/Behavioral:  Positive for sleep disturbance.     PHYSICAL EXAM:   Performance status (ECOG): 1 - Symptomatic but completely ambulatory  There were no vitals filed for  this visit. Wt Readings from Last 3 Encounters:  10/27/22 223 lb (101.2 kg)  07/27/22 216 lb (98 kg)  07/17/22 216 lb (98 kg)   Physical Exam Constitutional:      Appearance: Normal appearance. She is obese.  HENT:     Head: Normocephalic and atraumatic.     Mouth/Throat:     Mouth: Mucous membranes are moist.  Eyes:     Extraocular Movements: Extraocular movements intact.     Pupils: Pupils are equal, round, and reactive to light.  Cardiovascular:     Rate and Rhythm: Normal rate and regular rhythm.     Pulses: Normal pulses.     Heart sounds: Normal heart sounds.  Pulmonary:     Effort: Pulmonary effort is normal.     Breath sounds: Normal breath sounds.  Chest:  Breasts:    Right: Absent.     Left:  Tenderness (left lateral breast) present. No swelling, bleeding, inverted nipple, mass, nipple discharge or skin change.    Abdominal:     General: Bowel sounds are normal.     Palpations: Abdomen is soft.     Tenderness: There is no abdominal tenderness.  Musculoskeletal:        General: No swelling.     Right lower leg: 2+ Edema present.     Left lower leg: 2+ Edema present.  Lymphadenopathy:     Cervical: No cervical adenopathy.     Upper Body:     Right upper body: No supraclavicular, axillary or pectoral adenopathy.     Left upper body: No supraclavicular, axillary or pectoral adenopathy.  Skin:    General: Skin is warm and dry.  Neurological:     General: No focal deficit present.     Mental Status: She is alert and oriented to person, place, and time.  Psychiatric:        Mood and Affect: Mood normal.        Behavior: Behavior normal.     PAST MEDICAL/SURGICAL HISTORY:  Past Medical History:  Diagnosis Date   Allergic rhinitis due to pollen    Anxiety    Arthritis    "knees, back, right elbow; left shoulder; right ankle" (11/20/2014)   Breast cancer, right breast ([place]) 2016   "DCIS; zero stage" S/P mastectomy, cancer free since then   Cervical spondylosis without myelopathy    Cervicalgia    Chronic lower back pain    Constipation    takes Miralax daily   Degeneration of cervical intervertebral disc    Depression    Diverticulosis of colon (without mention of hemorrhage)    GERD (gastroesophageal reflux disease)    takes Omeprazole daily   Graves' disease    "took a pill to correct"   H/O hiatal hernia    H/O urinary frequency    Headache(784.0)    denies migraines since the 80's but has occ and takes Butalbital prn   HTN (hypertension)    takes Metoprolol and Lisinopril daily   Insomnia    takes Ativan nigtly   Joint pain    "all of them"   Joint swelling    "knees, legs, ankles sometimes" (11/20/2014)   Morbid obesity ([facility] [place])    Numbness    LOWER LEGS    Other postablative hypothyroidism    takes SYnthroid daily   Palpitations    Peptic ulcer, unspecified site, unspecified as acute or chronic, without mention of hemorrhage, perforation, or obstruction    Primary localized osteoarthrosis, lower leg  Pure hypercholesterolemia    takes Pravastin daily   Recurrent UTI    Renal cell carcinoma ([place] [facility]) 01/08/2016   Right renal mass s/p partial right nephrectomy, cancer free since 2017   Shortness of breath    Sleep apnea    slight but doesn't require a CPAP (11/20/2014)   Tendonitis of knee    bilateral   Past Surgical History:  Procedure Laterality Date   ABDOMINAL HYSTERECTOMY     ANTERIOR LAT LUMBAR FUSION  05/13/2012   Procedure: ANTERIOR LATERAL LUMBAR FUSION 2 LEVELS;  Surgeon: Faythe Ghee, MD;  Location: [facility];  Service: Neurosurgery;  Laterality: Left;  Left Lumbar Three-four,Lumbar four-five Extreme Lumbar Interbody Fusion with Percutaneous Pedicle Screws   APPENDECTOMY     BREAST BIOPSY Left ~ 2014   BREAST BIOPSY Right 2015   BREAST LUMPECTOMY Left 1988   BREAST RECONSTRUCTION WITH PLACEMENT OF TISSUE EXPANDER AND FLEX HD (ACELLULAR HYDRATED DERMIS) Right 11/20/2014   BREAST RECONSTRUCTION WITH PLACEMENT OF TISSUE EXPANDER AND FLEX HD (ACELLULAR HYDRATED DERMIS) Right 11/20/2014   Procedure: RIGHT BREAST RECONSTRUCTION WITH PLACEMENT OF TISSUE EXPANDER AND ACELLULAR DERMA MATRIX (ACELLULAR DERMA MATRIX);  Surgeon: Crissie Reese, MD;  Location: [facility];  Service: Plastics;  Laterality: Right;   CATARACT EXTRACTION W/ INTRAOCULAR LENS  IMPLANT, BILATERAL Bilateral    COLONOSCOPY  Sept 2008   RMR: normal rectum, left-sided diverticula, repeat in 2018   COLONOSCOPY WITH PROPOFOL N/A 03/21/2018   Dr. Gala Romney: diverticulosis, internal grade I hemorrhoids. no future colonoscopies unless develops new symptoms   DIAGNOSTIC LAPAROSCOPY     DILATION AND CURETTAGE OF UTERUS     ESOPHAGOGASTRODUODENOSCOPY     ESOPHAGOGASTRODUODENOSCOPY   2013   Dr. Gala Romney: Cervical esophageal web and Schatzki's ring s/p dilation, small hiatal hernia, antral and duodenal erosions likely NSAID effect, chronic duodenitis on path    ESOPHAGOGASTRODUODENOSCOPY (EGD) WITH PROPOFOL N/A 03/21/2018   Dr. Gala Romney: mild Schatzki ring s/p dilation   FOREIGN BODY REMOVAL Right 10/06/2013   Procedure: REMOVAL FOREIGN BODY EXTREMITY;  Surgeon: Carole Civil, MD;  Location: [facility];  Service: Orthopedics;  Laterality: Right;   INJECTION KNEE Left 10/06/2013   Procedure: KNEE INJECTION;  Surgeon: Carole Civil, MD;  Location: [facility];  Service: Orthopedics;  Laterality: Left;   JOINT REPLACEMENT Bilateral    knees   KNEE ARTHROSCOPY Right    KNEE ARTHROSCOPY WITH LATERAL MENISECTOMY Right 10/06/2013   Procedure: KNEE ARTHROSCOPY WITH LATERAL AND MEDIAL MENISECTOMY;  Surgeon: Carole Civil, MD;  Location: [facility];  Service: Orthopedics;  Laterality: Right;  END @ 1234   lumpectomy on pelvis     "mass of nerves"   [place] N/A 03/21/2018   Procedure: MALONEY DILATION;  Surgeon: Daneil Dolin, MD;  Location[facility];  Service: Endoscopy;  Laterality: N/A;   MASTECTOMY COMPLETE / SIMPLE W/ SENTINEL NODE BIOPSY Right 11/20/2014   axillary   REMOVAL OF TISSUE EXPANDER AND PLACEMENT OF IMPLANT Right 12/13/2014   Procedure: REMOVAL OF RIGHT BREAST TISSUE EXPANDER;  Surgeon: Crissie Reese, MD;  Location: [facility];  Service: Plastics;  Laterality: Right;   ROBOTIC ASSITED PARTIAL NEPHRECTOMY Right 01/08/2016   Procedure: XI ROBOTIC ASSITED PARTIAL NEPHRECTOMY;  Surgeon: Alexis Frock, MD;  Location: [facility];  Service: Urology;  Laterality: Right;   SIMPLE MASTECTOMY WITH AXILLARY SENTINEL NODE BIOPSY Right 11/20/2014   Procedure: SIMPLE MASTECTOMY WITH AXILLARY SENTINEL NODE BIOPSY;  Surgeon: Erroll Luna, MD;  Location: [facility];  Service: General;  Laterality: Right;   TISSUE EXPANDER REMOVAL Right 12/13/2014   dr Harlow Mares   TONSILLECTOMY     TOTAL KNEE  ARTHROPLASTY Right 01/24/2014   Procedure: TOTAL KNEE ARTHROPLASTY;  Surgeon: Carole Civil, MD;  Location: [facility];  Service: Orthopedics;  Laterality: Right;   TOTAL KNEE ARTHROPLASTY Left 08/08/2014   Procedure: TOTAL KNEE ARTHROPLASTY;  Surgeon: Carole Civil, MD;  Location: [facility];  Service: Orthopedics;  Laterality: Left;   TUBAL LIGATION     UPPER GASTROINTESTINAL ENDOSCOPY      SOCIAL HISTORY:  Social History   Socioeconomic History   Marital status: Married    Spouse name: Milbert Coulter   Number of children: 4   Years of education: Not on file   Highest education level: Not on file  Occupational History   Occupation: disability    Employer: UNEMPLOYED    Comment: since 2008, knee surgery  Tobacco Use   Smoking status: Former    Packs/day: 1.00    Years: 27.00    Total pack years: 27.00    Types: Cigarettes    Quit date: 12/26/1985    Years since quitting: 36.8   Smokeless tobacco: Never   Tobacco comments:    Quit in 1987  Vaping Use   Vaping Use: Never used  Substance and Sexual Activity   Alcohol use: Yes    Alcohol/week: 0.0 standard drinks of alcohol    Comment: occasional wine   Drug use: No   Sexual activity: Yes    Birth control/protection: Surgical  Other Topics Concern   Not on file  Social History Narrative   Currently married for 10 years. Husband's name is Milbert Coulter. She is an excellent singer. 8 children between she and her husband. 23 grand children. 12 great grandchildren.   Social Determinants of Health   Financial Resource Strain: Not on file  Food Insecurity: Not on file  Transportation Needs: Not on file  Physical Activity: Not on file  Stress: Not on file  Social Connections: Not on file  Intimate Partner Violence: Not on file    FAMILY HISTORY:  Family History  Problem Relation Age of Onset   Stroke Sister    Heart attack Sister        In her 23s   Osteoarthritis Sister    Heart attack Sister        In her 90s   Osteoarthritis  Brother    Obesity Brother    Heart attack Sister        In her 59s   Heart attack Father        He died at age 75   Lung cancer Maternal Grandmother        non-smoker   Colon cancer Neg Hx        Mother died at 1 from blood clot, MI    CURRENT MEDICATIONS:  Current Outpatient Medications  Medication Sig Dispense Refill   acetaminophen (TYLENOL) 500 MG tablet Take 650 mg by mouth in the morning and at bedtime.     aspirin EC 81 MG tablet Take 81 mg by mouth daily. Swallow whole.     atorvastatin (LIPITOR) 20 MG tablet Take 1 tablet (20 mg total) by mouth daily. 30 tablet 11   diphenhydrAMINE (BENADRYL) 50 MG capsule Take 50 mg by mouth at bedtime.     gabapentin (NEURONTIN) 600 MG tablet TAKE 1 TABLET BY MOUTH TWICE  DAILY 200 tablet 2   levothyroxine (SYNTHROID) 100 MCG  tablet Take 100 mcg by mouth daily.     metoprolol tartrate (LOPRESSOR) 25 MG tablet Take 25 mg ( 1 tablet ) in morning and take ( 1/2  tablet ) 12.5 mg at night 135 tablet 3   Multiple Vitamins-Minerals (CENTRUM SILVER PO) Take 1 tablet by mouth daily.     omeprazole (PRILOSEC) 20 MG capsule Take 20 mg by mouth daily. May take a second 20 mg dose as needed for heartburn     oxybutynin (DITROPAN) 5 MG tablet Take 5 mg by mouth 2 (two) times daily.      spironolactone (ALDACTONE) 25 MG tablet TAKE 1/2 TABLET DAILY 15 tablet 1   torsemide (DEMADEX) 20 MG tablet Take 2 tablets ( 40 mg ) alternating 1 tablet ( 20 mg ) every other day 135 tablet 3   Vitamin D, Ergocalciferol, (DRISDOL) 1.25 MG (50000 UNIT) CAPS capsule TAKE 1 CAPSULE BY MOUTH WEEKLY 15 capsule 2   No current facility-administered medications for this visit.    ALLERGIES:  Allergies  Allergen Reactions   Propoxyphene Hcl Other (See Comments)     Hallucinations   Pollen Extract Other (See Comments)    Sneezing, congestion   Camphor Itching   Dust Mite Extract Itching    Sneezing. Runny nose    Latex Dermatitis and Rash   Molds & Smuts Itching    Tomato Hives    LABORATORY DATA:  I have reviewed the labs as listed.     Latest Ref Rng & Units 10/22/2022    8:14 AM 10/15/2021   10:02 AM 10/14/2020    9:04 AM  CBC  WBC 4.0 - 10.5 K/uL 6.9  10.5  7.1   Hemoglobin 12.0 - 15.0 g/dL 13.1  14.7  13.8   Hematocrit 36.0 - 46.0 % 38.9  43.5  41.8   Platelets 150 - 400 K/uL 226  224  282       Latest Ref Rng & Units 10/27/2022    3:36 PM 10/22/2022    8:14 AM 12/26/2021    3:02 PM  CMP  Glucose 70 - 99 mg/dL 81  103  106   BUN 8 - 27 mg/dL 18  19  15   $ Creatinine 0.57 - 1.00 mg/dL 1.00  1.05  1.04   Sodium 134 - 144 mmol/L 143  135  141   Potassium 3.5 - 5.2 mmol/L 4.2  3.3  3.9   Chloride 96 - 106 mmol/L 100  101  99   CO2 20 - 29 mmol/L 31  26  28   $ Calcium 8.7 - 10.3 mg/dL 8.7  7.8  8.7   Total Protein 6.5 - 8.1 g/dL  5.3  5.7   Total Bilirubin 0.3 - 1.2 mg/dL  0.3  0.4   Alkaline Phos 38 - 126 U/L  77  88   AST 15 - 41 U/L  43  46   ALT 0 - 44 U/L  28  32     DIAGNOSTIC IMAGING:  I have independently reviewed the scans and discussed with the patient. MM 3D SCREEN BREAST UNI LEFT  Result Date: 10/23/2022 CLINICAL DATA:  Screening. EXAM: DIGITAL SCREENING UNILATERAL LEFT MAMMOGRAM WITH CAD AND TOMOSYNTHESIS TECHNIQUE: Left screening digital craniocaudal and mediolateral oblique mammograms were obtained. Left screening digital breast tomosynthesis was performed. The images were evaluated with computer-aided detection. COMPARISON:  Previous exam(s). ACR Breast Density Category c: The breast tissue is heterogeneously dense, which may obscure small masses. FINDINGS: The patient  has had a right mastectomy. There are no findings suspicious for malignancy. IMPRESSION: No mammographic evidence of malignancy. A result letter of this screening mammogram will be mailed directly to the patient. RECOMMENDATION: Screening mammogram in one year.  (Code:SM-L-58M) BI-RADS CATEGORY  1: Negative. Electronically Signed   By: Ammie Ferrier M.D.   On:  10/23/2022 13:26     WRAP UP:  All questions were answered. The patient knows to call the clinic with any problems, questions or concerns.  Medical decision making: Moderate  Time spent on visit: I spent 20 minutes counseling the patient face to face. The total time spent in the appointment was 30 minutes and more than 50% was on counseling.  Harriett Rush, PA-C  11/05/22 1:47 PM

## 2022-11-05 ENCOUNTER — Inpatient Hospital Stay: Payer: Medicare Other | Admitting: Physician Assistant

## 2022-11-05 ENCOUNTER — Inpatient Hospital Stay: Payer: Medicare Other | Admitting: Licensed Clinical Social Worker

## 2022-11-05 ENCOUNTER — Other Ambulatory Visit (HOSPITAL_COMMUNITY): Payer: Self-pay | Admitting: Physician Assistant

## 2022-11-05 ENCOUNTER — Other Ambulatory Visit: Payer: Self-pay

## 2022-11-05 VITALS — BP 104/74 | HR 60 | Temp 97.6°F | Resp 16 | Wt 222.7 lb

## 2022-11-05 DIAGNOSIS — Z79899 Other long term (current) drug therapy: Secondary | ICD-10-CM | POA: Diagnosis not present

## 2022-11-05 DIAGNOSIS — R519 Headache, unspecified: Secondary | ICD-10-CM | POA: Diagnosis not present

## 2022-11-05 DIAGNOSIS — E559 Vitamin D deficiency, unspecified: Secondary | ICD-10-CM

## 2022-11-05 DIAGNOSIS — Z85528 Personal history of other malignant neoplasm of kidney: Secondary | ICD-10-CM | POA: Diagnosis not present

## 2022-11-05 DIAGNOSIS — R0609 Other forms of dyspnea: Secondary | ICD-10-CM | POA: Diagnosis not present

## 2022-11-05 DIAGNOSIS — Z87891 Personal history of nicotine dependence: Secondary | ICD-10-CM | POA: Diagnosis not present

## 2022-11-05 DIAGNOSIS — R42 Dizziness and giddiness: Secondary | ICD-10-CM | POA: Diagnosis not present

## 2022-11-05 DIAGNOSIS — Z1231 Encounter for screening mammogram for malignant neoplasm of breast: Secondary | ICD-10-CM | POA: Diagnosis not present

## 2022-11-05 DIAGNOSIS — D0511 Intraductal carcinoma in situ of right breast: Secondary | ICD-10-CM | POA: Diagnosis not present

## 2022-11-05 DIAGNOSIS — Z905 Acquired absence of kidney: Secondary | ICD-10-CM | POA: Diagnosis not present

## 2022-11-05 DIAGNOSIS — G8929 Other chronic pain: Secondary | ICD-10-CM | POA: Diagnosis not present

## 2022-11-05 DIAGNOSIS — I890 Lymphedema, not elsewhere classified: Secondary | ICD-10-CM | POA: Diagnosis not present

## 2022-11-05 DIAGNOSIS — R600 Localized edema: Secondary | ICD-10-CM | POA: Diagnosis not present

## 2022-11-05 DIAGNOSIS — R200 Anesthesia of skin: Secondary | ICD-10-CM | POA: Diagnosis not present

## 2022-11-05 DIAGNOSIS — G479 Sleep disorder, unspecified: Secondary | ICD-10-CM | POA: Diagnosis not present

## 2022-11-05 DIAGNOSIS — Z9011 Acquired absence of right breast and nipple: Secondary | ICD-10-CM | POA: Diagnosis not present

## 2022-11-05 DIAGNOSIS — R5383 Other fatigue: Secondary | ICD-10-CM | POA: Diagnosis not present

## 2022-11-05 DIAGNOSIS — Z7982 Long term (current) use of aspirin: Secondary | ICD-10-CM | POA: Diagnosis not present

## 2022-11-05 DIAGNOSIS — Z86000 Personal history of in-situ neoplasm of breast: Secondary | ICD-10-CM | POA: Diagnosis not present

## 2022-11-05 NOTE — Progress Notes (Signed)
[facility] CSW Progress Note  Clinical Education officer, museum  received request from medical provider to meet w/ pt regarding financial concerns.  Pt diagnosed w/ breast cancer 5 years ago and completed treatment which included surgery.  Pt would have qualified for breast cancer organizations grants while undergoing treatment, but does not so far beyond treatment.  Per medical provider it is recommended pt go to the lymphedema clinic to address persistent issues.  Pt states that when she went to the clinic they instructed her to come to the clinic 3 times a week for 6 weeks which would have incurred a $20 co-pay for each visit that the pt is unable to afford.  CSW to research if there are any grant programs to assist pt's with lymphedema issues post treatment.       Henriette Combs, LCSW

## 2022-11-05 NOTE — Patient Instructions (Addendum)
[facility] at [facility] Discharge Instructions  You were seen today by Tarri Abernethy PA-C for your history of breast cancer.  You do not currently have any signs of recurrent breast cancer.  We will check your labs and mammogram again in 1 year and we will see you for an office visit after those tests have been completed.  LABS: Return in 1 year for repeat labs  OTHER TESTS: Mammogram in 1 year  MEDICATIONS: Continue vitamin D 50,000 units weekly.  FOLLOW-UP APPOINTMENT: Office visit in 1 year, after labs and mammogram.   Thank you for choosing [place] at [facility] to provide your oncology and hematology care.  To afford each patient quality time with our provider, please arrive at least 15 minutes before your scheduled appointment time.   If you have a lab appointment with the [facility] please come in thru the Main Entrance and check in at the main information desk.  You need to re-schedule your appointment should you arrive 10 or more minutes late.  We strive to give you quality time with our providers, and arriving late affects you and other patients whose appointments are after yours.  Also, if you no show three or more times for appointments you may be dismissed from the clinic at the providers discretion.     Again, thank you for choosing [facility].  Our hope is that these requests will decrease the amount of time that you wait before being seen by our physicians.       _____________________________________________________________  Should you have questions after your visit to [facility], please contact our office at [phone] and follow the prompts.  Our office hours are 8:00 a.m. and 4:30 p.m. Monday - Friday.  Please note that voicemails left after 4:00 p.m. may not be returned until the following business day.  We are closed weekends and major holidays.  You do have access to a nurse 24-7,  just call the main number to the clinic [phone] and do not press any options, hold on the line and a nurse will answer the phone.    For prescription refill requests, have your pharmacy contact our office and allow 72 hours.    Due to Covid, you will need to wear a mask upon entering the hospital. If you do not have a mask, a mask will be given to you at the Main Entrance upon arrival. For doctor visits, patients may have 1 support person age 57 or older with them. For treatment visits, patients can not have anyone with them due to social distancing guidelines and our immunocompromised population.

## 2022-11-06 DIAGNOSIS — Z5181 Encounter for therapeutic drug level monitoring: Secondary | ICD-10-CM | POA: Diagnosis not present

## 2022-11-06 DIAGNOSIS — I10 Essential (primary) hypertension: Secondary | ICD-10-CM | POA: Diagnosis not present

## 2022-11-07 LAB — BASIC METABOLIC PANEL
BUN/Creatinine Ratio: 11 — ABNORMAL LOW (ref 12–28)
BUN: 12 mg/dL (ref 8–27)
CO2: 29 mmol/L (ref 20–29)
Calcium: 8.5 mg/dL — ABNORMAL LOW (ref 8.7–10.3)
Chloride: 99 mmol/L (ref 96–106)
Creatinine, Ser: 1.11 mg/dL — ABNORMAL HIGH (ref 0.57–1.00)
Glucose: 69 mg/dL — ABNORMAL LOW (ref 70–99)
Potassium: 3.9 mmol/L (ref 3.5–5.2)
Sodium: 142 mmol/L (ref 134–144)
eGFR: 52 mL/min/{1.73_m2} — ABNORMAL LOW (ref >59)

## 2022-11-09 ENCOUNTER — Inpatient Hospital Stay: Payer: Medicare Other | Admitting: Licensed Clinical Social Worker

## 2022-11-09 ENCOUNTER — Telehealth (HOSPITAL_BASED_OUTPATIENT_CLINIC_OR_DEPARTMENT_OTHER): Payer: Self-pay

## 2022-11-09 DIAGNOSIS — G4733 Obstructive sleep apnea (adult) (pediatric): Secondary | ICD-10-CM | POA: Diagnosis not present

## 2022-11-09 DIAGNOSIS — D0511 Intraductal carcinoma in situ of right breast: Secondary | ICD-10-CM

## 2022-11-09 DIAGNOSIS — I10 Essential (primary) hypertension: Secondary | ICD-10-CM

## 2022-11-09 NOTE — Progress Notes (Signed)
[facility] CSW Progress Note  Clinical Education officer, museum contacted patient by phone to inform about an organization called the [employer] who assist financially w/ receiving treatment for lymphedema.  Link to Chiropodist to pt.  Pt to contact CSW with any questions she may have following registration.      Henriette Combs, LCSW

## 2022-11-09 NOTE — Telephone Encounter (Addendum)
Seen by patient [name] on 11/08/2022  6:41 PM; labs ordered and mailed to patient. Follow up mychart message sent to patient.     ----- Message from Loel Dubonnet, NP sent at 11/07/2022  2:16 PM EST ----- Normal potassium. Kidney function slightly decreased from previous. Ensure staying hydrated. Continue same medications and repeat BMP in 2-3 weeks for monitoring.

## 2022-11-13 ENCOUNTER — Telehealth (HOSPITAL_BASED_OUTPATIENT_CLINIC_OR_DEPARTMENT_OTHER): Payer: Self-pay | Admitting: Family

## 2022-11-13 NOTE — Telephone Encounter (Signed)
New Message:    Patient wants her lab order to be sent to [facility] on [address] in [place] please.

## 2022-11-13 NOTE — Telephone Encounter (Signed)
Advised patient ok to go to any [facility], orders should cross over

## 2022-11-17 ENCOUNTER — Other Ambulatory Visit (HOSPITAL_COMMUNITY): Payer: Self-pay | Admitting: Family Medicine

## 2022-11-17 DIAGNOSIS — N3281 Overactive bladder: Secondary | ICD-10-CM | POA: Diagnosis not present

## 2022-11-17 DIAGNOSIS — E039 Hypothyroidism, unspecified: Secondary | ICD-10-CM | POA: Diagnosis not present

## 2022-11-17 DIAGNOSIS — I10 Essential (primary) hypertension: Secondary | ICD-10-CM | POA: Diagnosis not present

## 2022-11-17 DIAGNOSIS — G4733 Obstructive sleep apnea (adult) (pediatric): Secondary | ICD-10-CM | POA: Diagnosis not present

## 2022-11-17 DIAGNOSIS — R600 Localized edema: Secondary | ICD-10-CM | POA: Diagnosis not present

## 2022-11-17 DIAGNOSIS — M545 Low back pain, unspecified: Secondary | ICD-10-CM

## 2022-11-18 ENCOUNTER — Telehealth: Payer: Self-pay | Admitting: *Deleted

## 2022-11-18 ENCOUNTER — Ambulatory Visit (HOSPITAL_COMMUNITY)
Admission: RE | Admit: 2022-11-18 | Discharge: 2022-11-18 | Disposition: A | Discharge status: Home / Self Care | Payer: Medicare Other | Source: Ambulatory Visit | Attending: Family | Admitting: Family

## 2022-11-18 DIAGNOSIS — G4733 Obstructive sleep apnea (adult) (pediatric): Secondary | ICD-10-CM | POA: Insufficient documentation

## 2022-11-18 DIAGNOSIS — I10 Essential (primary) hypertension: Secondary | ICD-10-CM | POA: Insufficient documentation

## 2022-11-18 DIAGNOSIS — I471 Supraventricular tachycardia, unspecified: Secondary | ICD-10-CM | POA: Diagnosis not present

## 2022-11-18 LAB — ECHOCARDIOGRAM COMPLETE
Area-P 1/2: 3.08 cm2
S' Lateral: 2.1 cm

## 2022-11-18 NOTE — Progress Notes (Signed)
*  PRELIMINARY RESULTS* Echocardiogram 2D Echocardiogram has been performed.  Samuel Germany 11/18/2022, 11:56 AM

## 2022-11-18 NOTE — Telephone Encounter (Signed)
Contacted regarding PREP Class. Patient returned my call. She will move up to the March 5th, 2024 T/TH [id number] class. Intake assessment visit scheduled for 11/20/2022 '@1215'$ .

## 2022-11-19 ENCOUNTER — Encounter: Payer: Self-pay | Admitting: Radiology

## 2022-11-20 ENCOUNTER — Encounter: Payer: Self-pay | Admitting: *Deleted

## 2022-11-20 NOTE — Progress Notes (Signed)
[facility] PREP Evaluation  Patient Details  Name: Sara [name] MRN: [id number] Date of Birth: 09-25-1945 Age: 77 y.o. PCP: [name], MD  Vitals:   11/20/22 1215  BP: 108/64  Pulse: (!) 53  Resp: 20  SpO2: 96%  Weight: 222 lb 12.8 oz (101.1 kg)  Height: '5\' 6"'$  (1.676 m)     [facility] Eval - 11/20/22 1215       [facility] "PREP" Location   [facility] "PREP" Location [facility]      Referral    Referring Provider Walker    Reason for referral High Cholesterol;Obesitity/Overweight;Inactivity;Hypertension    Program Start Date 11/24/22    Program End Date 02/11/23      Measurement   Waist Circumference 42.75 inches    Hip Circumference 47 inches    Body fat 46.7 percent      Information for Trainer   Goals Increase strength and stamina, healthier diet, establish an exercise routine, weight loss 5-10lbs in 12 weeks    Current Exercise none    Orthopedic Concerns lower back pain with sciatica {left), bilateral knee replacements   self reported   Pertinent Medical History HTN, Obesity    Current Barriers none indicated    Medications that affect exercise Beta blocker      Mobility and Daily Activities   I find it easy to walk up or down two or more flights of stairs. 1    I have no trouble taking out the trash. 3    I do housework such as vacuuming and dusting on my own without difficulty. 2    I can easily lift a gallon of milk (8lbs). 4    I can easily walk a mile. 1    I have no trouble reaching into high cupboards or reaching down to pick up something from the floor. 2    I do not have trouble doing out-door work such as Armed forces logistics/support/administrative officer, raking leaves, or gardening. 1      Mobility and Daily Activities   I feel younger than my age. 3    I feel independent. 4    I feel energetic. 1    I live an active life.  1    I feel strong. 2    I feel healthy. 1    I feel active as other people my age. 2      How fit and strong are you.   Fit and Strong Total Score 28             Past Medical History:  Diagnosis Date   Allergic rhinitis due to pollen    Anxiety    Arthritis    "knees, back, right elbow; left shoulder; right ankle" (11/20/2014)   Breast cancer, right breast ([place]) 2016   "DCIS; zero stage" S/P mastectomy, cancer free since then   Cervical spondylosis without myelopathy    Cervicalgia    Chronic lower back pain    Constipation    takes Miralax daily   Degeneration of cervical intervertebral disc    Depression    Diverticulosis of colon (without mention of hemorrhage)    GERD (gastroesophageal reflux disease)    takes Omeprazole daily   Graves' disease    "took a pill to correct"   H/O hiatal hernia    H/O urinary frequency    Headache(784.0)    denies migraines since the 80's but has occ and takes Butalbital prn   HTN (hypertension)  takes Metoprolol and Lisinopril daily   Insomnia    takes Ativan nigtly   Joint pain    "all of them"   Joint swelling    "knees, legs, ankles sometimes" (11/20/2014)   Morbid obesity (HCC)    Numbness    LOWER LEGS   Other postablative hypothyroidism    takes SYnthroid daily   Palpitations    Peptic ulcer, unspecified site, unspecified as acute or chronic, without mention of hemorrhage, perforation, or obstruction    Primary localized osteoarthrosis, lower leg    Pure hypercholesterolemia    takes Pravastin daily   Recurrent UTI    Renal cell carcinoma ([place]) 01/08/2016   Right renal mass s/p partial right nephrectomy, cancer free since 2017   Shortness of breath    Sleep apnea    slight but doesn't require a CPAP (11/20/2014)   Tendonitis of knee    bilateral   Past Surgical History:  Procedure Laterality Date   ABDOMINAL HYSTERECTOMY     ANTERIOR LAT LUMBAR FUSION  05/13/2012   Procedure: ANTERIOR LATERAL LUMBAR FUSION 2 LEVELS;  Surgeon: Faythe Ghee, MD;  Location: [facility];  Service: Neurosurgery;  Laterality: Left;  Left Lumbar Three-four,Lumbar four-five Extreme Lumbar  Interbody Fusion with Percutaneous Pedicle Screws   APPENDECTOMY     BREAST BIOPSY Left ~ 2014   BREAST BIOPSY Right 2015   BREAST LUMPECTOMY Left 1988   BREAST RECONSTRUCTION WITH PLACEMENT OF TISSUE EXPANDER AND FLEX HD (ACELLULAR HYDRATED DERMIS) Right 11/20/2014   BREAST RECONSTRUCTION WITH PLACEMENT OF TISSUE EXPANDER AND FLEX HD (ACELLULAR HYDRATED DERMIS) Right 11/20/2014   Procedure: RIGHT BREAST RECONSTRUCTION WITH PLACEMENT OF TISSUE EXPANDER AND ACELLULAR DERMA MATRIX (ACELLULAR DERMA MATRIX);  Surgeon: Crissie Reese, MD;  Location: [facility];  Service: Plastics;  Laterality: Right;   CATARACT EXTRACTION W/ INTRAOCULAR LENS  IMPLANT, BILATERAL Bilateral    COLONOSCOPY  Sept 2008   RMR: normal rectum, left-sided diverticula, repeat in 2018   COLONOSCOPY WITH PROPOFOL N/A 03/21/2018   Dr. Gala Romney: diverticulosis, internal grade I hemorrhoids. no future colonoscopies unless develops new symptoms   DIAGNOSTIC LAPAROSCOPY     DILATION AND CURETTAGE OF UTERUS     ESOPHAGOGASTRODUODENOSCOPY     ESOPHAGOGASTRODUODENOSCOPY  2013   Dr. Gala Romney: Cervical esophageal web and Schatzki's ring s/p dilation, small hiatal hernia, antral and duodenal erosions likely NSAID effect, chronic duodenitis on path    ESOPHAGOGASTRODUODENOSCOPY (EGD) WITH PROPOFOL N/A 03/21/2018   Dr. Gala Romney: mild Schatzki ring s/p dilation   FOREIGN BODY REMOVAL Right 10/06/2013   Procedure: REMOVAL FOREIGN BODY EXTREMITY;  Surgeon: Carole Civil, MD;  Location: [facility];  Service: Orthopedics;  Laterality: Right;   INJECTION KNEE Left 10/06/2013   Procedure: KNEE INJECTION;  Surgeon: Carole Civil, MD;  Location: [facility];  Service: Orthopedics;  Laterality: Left;   JOINT REPLACEMENT Bilateral    knees   KNEE ARTHROSCOPY Right    KNEE ARTHROSCOPY WITH LATERAL MENISECTOMY Right 10/06/2013   Procedure: KNEE ARTHROSCOPY WITH LATERAL AND MEDIAL MENISECTOMY;  Surgeon: Carole Civil, MD;  Location: [facility];  Service: Orthopedics;   Laterality: Right;  END @ 1234   lumpectomy on pelvis     "mass of nerves"   [place] N/A 03/21/2018   Procedure: MALONEY DILATION;  Surgeon: Daneil Dolin, MD;  [facility];  Service: Endoscopy;  Laterality: N/A;   MASTECTOMY COMPLETE / SIMPLE W/ SENTINEL NODE BIOPSY Right 11/20/2014   axillary   REMOVAL  OF TISSUE EXPANDER AND PLACEMENT OF IMPLANT Right 12/13/2014   Procedure: REMOVAL OF RIGHT BREAST TISSUE EXPANDER;  Surgeon: Crissie Reese, MD;  Location: [facility];  Service: Plastics;  Laterality: Right;   ROBOTIC ASSITED PARTIAL NEPHRECTOMY Right 01/08/2016   Procedure: XI ROBOTIC ASSITED PARTIAL NEPHRECTOMY;  Surgeon: Alexis Frock, MD;  Location: [facility];  Service: Urology;  Laterality: Right;   SIMPLE MASTECTOMY WITH AXILLARY SENTINEL NODE BIOPSY Right 11/20/2014   Procedure: SIMPLE MASTECTOMY WITH AXILLARY SENTINEL NODE BIOPSY;  Surgeon: Erroll Luna, MD;  Location: [facility];  Service: General;  Laterality: Right;   TISSUE EXPANDER REMOVAL Right 12/13/2014   dr Harlow Mares   TONSILLECTOMY     TOTAL KNEE ARTHROPLASTY Right 01/24/2014   Procedure: TOTAL KNEE ARTHROPLASTY;  Surgeon: Carole Civil, MD;  Location: [facility];  Service: Orthopedics;  Laterality: Right;   TOTAL KNEE ARTHROPLASTY Left 08/08/2014   Procedure: TOTAL KNEE ARTHROPLASTY;  Surgeon: Carole Civil, MD;  Location: [facility];  Service: Orthopedics;  Laterality: Left;   TUBAL LIGATION     UPPER GASTROINTESTINAL ENDOSCOPY     Social History   Tobacco Use  Smoking Status Former   Packs/day: 1.00   Years: 27.00   Total pack years: 27.00   Types: Cigarettes   Quit date: 12/26/1985   Years since quitting: 36.9  Smokeless Tobacco Never  Tobacco Comments   Quit in [address] 11/20/2022, 4:03 PM  PREP Class beginning 11/24/2022 T/TH [id number]. No barriers to attendance indicated.

## 2022-11-24 ENCOUNTER — Encounter: Payer: Self-pay | Admitting: *Deleted

## 2022-11-24 NOTE — Progress Notes (Signed)
[facility] PREP Weekly Session  Patient Details  Name: Sara [name] MRN: [id number] Date of Birth: 10-17-1945 Age: 77 y.o. PCP: [name], MD  There were no vitals filed for this visit.   [facility] Weekly seesion - 11/24/22 1300       [facility] "PREP" Location   [facility] "PREP" Location [facility]      Weekly Session   Topic Discussed Goal setting and welcome to the program   Introductions, review book and weekly check-in papers, discuss percieved exertion scale, tour of facilty.   Classes attended to date [place], [place] 11/24/2022, 1:43 PM

## 2022-11-26 DIAGNOSIS — I10 Essential (primary) hypertension: Secondary | ICD-10-CM | POA: Diagnosis not present

## 2022-11-27 ENCOUNTER — Encounter (HOSPITAL_BASED_OUTPATIENT_CLINIC_OR_DEPARTMENT_OTHER): Payer: Self-pay

## 2022-11-27 LAB — BASIC METABOLIC PANEL
BUN/Creatinine Ratio: 14 (ref 12–28)
BUN: 13 mg/dL (ref 8–27)
CO2: 28 mmol/L (ref 20–29)
Calcium: 8.4 mg/dL — ABNORMAL LOW (ref 8.7–10.3)
Chloride: 98 mmol/L (ref 96–106)
Creatinine, Ser: 0.93 mg/dL (ref 0.57–1.00)
Glucose: 85 mg/dL (ref 70–99)
Potassium: 4.1 mmol/L (ref 3.5–5.2)
Sodium: 143 mmol/L (ref 134–144)
eGFR: 64 mL/min/{1.73_m2} (ref >59)

## 2022-11-27 MED ORDER — SPIRONOLACTONE 25 MG PO TABS: ORAL_TABLET | ORAL | 3 refills | Status: DC

## 2022-12-01 ENCOUNTER — Encounter: Payer: Self-pay | Admitting: *Deleted

## 2022-12-01 NOTE — Progress Notes (Signed)
[facility] PREP Weekly Session  Patient Details  Name: Sara [name] MRN: [id number] Date of Birth: August 23, 1946 Age: 77 y.o. PCP: [name], MD  Vitals:   12/01/22 1130  Weight: 222 lb 3.2 oz (100.8 kg)     [facility] Weekly seesion - 12/01/22 1300       [facility] "PREP" Location   [facility] "PREP" Location [facility]      Weekly Session   Topic Discussed Other ways to be active;Importance of resistance training   150 minutes cardio/week, 2-4 days/week of strength training (20-40 minutes/session), stretching for flexability, warm up/cool down   Minutes exercised this week 15 minutes    Classes attended to date [place], [place] 12/01/2022, 1:56 PM

## 2022-12-08 ENCOUNTER — Encounter: Payer: Self-pay | Admitting: *Deleted

## 2022-12-08 DIAGNOSIS — G4733 Obstructive sleep apnea (adult) (pediatric): Secondary | ICD-10-CM | POA: Diagnosis not present

## 2022-12-08 NOTE — Progress Notes (Unsigned)
[facility] PREP Weekly Session  Patient Details  Name: Sara [name] MRN: [id number] Date of Birth: December 01, 1945 Age: 77 y.o. PCP: [name], MD  Vitals:   12/08/22 1130  Weight: 223 lb (101.2 kg)     [facility] Weekly seesion - 12/08/22 1300       [facility] "PREP" Location   [facility] "PREP" Location [facility]      Weekly Session   Topic Discussed Healthy eating tips   Overview of macronutrients: fats. protein, carbs. Added sugars 24gms/day for women, 36gms/day for men, salt 1500mg -2400mg , YUKA App.   Minutes exercised this week 180 minutes    Classes attended to date [place], [place] 12/08/2022, 2:12 PM

## 2022-12-10 ENCOUNTER — Ambulatory Visit (HOSPITAL_BASED_OUTPATIENT_CLINIC_OR_DEPARTMENT_OTHER): Payer: Medicare Other | Admitting: Family

## 2022-12-14 ENCOUNTER — Ambulatory Visit (HOSPITAL_COMMUNITY)
Admission: RE | Admit: 2022-12-14 | Discharge: 2022-12-14 | Disposition: A | Discharge status: Home / Self Care | Payer: Medicare Other | Source: Ambulatory Visit | Attending: Family Medicine | Admitting: Family Medicine

## 2022-12-14 DIAGNOSIS — M48061 Spinal stenosis, lumbar region without neurogenic claudication: Secondary | ICD-10-CM | POA: Diagnosis not present

## 2022-12-14 DIAGNOSIS — M545 Low back pain, unspecified: Secondary | ICD-10-CM | POA: Insufficient documentation

## 2022-12-14 DIAGNOSIS — M5136 Other intervertebral disc degeneration, lumbar region: Secondary | ICD-10-CM | POA: Diagnosis not present

## 2022-12-14 DIAGNOSIS — M5126 Other intervertebral disc displacement, lumbar region: Secondary | ICD-10-CM | POA: Diagnosis not present

## 2022-12-15 DIAGNOSIS — G4733 Obstructive sleep apnea (adult) (pediatric): Secondary | ICD-10-CM | POA: Diagnosis not present

## 2022-12-15 DIAGNOSIS — R600 Localized edema: Secondary | ICD-10-CM | POA: Diagnosis not present

## 2022-12-15 DIAGNOSIS — I10 Essential (primary) hypertension: Secondary | ICD-10-CM | POA: Diagnosis not present

## 2022-12-15 DIAGNOSIS — R001 Bradycardia, unspecified: Secondary | ICD-10-CM | POA: Diagnosis not present

## 2022-12-15 DIAGNOSIS — R7401 Elevation of levels of liver transaminase levels: Secondary | ICD-10-CM | POA: Diagnosis not present

## 2022-12-15 DIAGNOSIS — M150 Primary generalized (osteo)arthritis: Secondary | ICD-10-CM | POA: Diagnosis not present

## 2022-12-15 DIAGNOSIS — E039 Hypothyroidism, unspecified: Secondary | ICD-10-CM | POA: Diagnosis not present

## 2022-12-16 ENCOUNTER — Encounter: Payer: Self-pay | Admitting: *Deleted

## 2022-12-16 NOTE — Progress Notes (Signed)
[facility] PREP Weekly Session  Patient Details  Name: Sara [name] MRN: [id number] Date of Birth: June 29, 1946 Age: 77 y.o. PCP: [name], MD  Vitals:   12/16/22 1130  Weight: 222 lb 3.2 oz (100.8 kg)     [facility] Weekly seesion - 12/16/22 1300       [facility] "PREP" Location   [facility] "PREP" Location [facility]      Weekly Session   Topic Discussed Health habits;Water   Limit added sugar 24gms/day for women, 36 gms/day for men. Effects of sugar. Tips for reducing sugar. Sugar demo   Classes attended to date [place], [place] 12/16/2022, 5:04 PM

## 2022-12-17 DIAGNOSIS — M5416 Radiculopathy, lumbar region: Secondary | ICD-10-CM | POA: Diagnosis not present

## 2022-12-19 ENCOUNTER — Other Ambulatory Visit: Payer: Self-pay | Admitting: Cardiovascular Disease

## 2022-12-20 ENCOUNTER — Other Ambulatory Visit: Payer: Self-pay

## 2022-12-20 ENCOUNTER — Encounter (HOSPITAL_COMMUNITY): Payer: Self-pay | Admitting: Emergency Medicine

## 2022-12-20 ENCOUNTER — Emergency Department (HOSPITAL_COMMUNITY): Payer: Medicare Other

## 2022-12-20 ENCOUNTER — Inpatient Hospital Stay (HOSPITAL_COMMUNITY)
Admission: EM | Admit: 2022-12-20 | Discharge: 2022-12-22 | DRG: 312 | Disposition: A | Discharge status: Home / Self Care | Payer: Medicare Other | Attending: Internal Medicine | Admitting: Internal Medicine

## 2022-12-20 DIAGNOSIS — Z79890 Hormone replacement therapy (postmenopausal): Secondary | ICD-10-CM

## 2022-12-20 DIAGNOSIS — Z85528 Personal history of other malignant neoplasm of kidney: Secondary | ICD-10-CM

## 2022-12-20 DIAGNOSIS — E785 Hyperlipidemia, unspecified: Secondary | ICD-10-CM | POA: Diagnosis not present

## 2022-12-20 DIAGNOSIS — E860 Dehydration: Secondary | ICD-10-CM | POA: Diagnosis not present

## 2022-12-20 DIAGNOSIS — F32A Depression, unspecified: Secondary | ICD-10-CM | POA: Diagnosis not present

## 2022-12-20 DIAGNOSIS — Z823 Family history of stroke: Secondary | ICD-10-CM | POA: Diagnosis not present

## 2022-12-20 DIAGNOSIS — M7989 Other specified soft tissue disorders: Secondary | ICD-10-CM | POA: Diagnosis present

## 2022-12-20 DIAGNOSIS — Z8711 Personal history of peptic ulcer disease: Secondary | ICD-10-CM

## 2022-12-20 DIAGNOSIS — Z96653 Presence of artificial knee joint, bilateral: Secondary | ICD-10-CM | POA: Diagnosis not present

## 2022-12-20 DIAGNOSIS — Z87891 Personal history of nicotine dependence: Secondary | ICD-10-CM | POA: Diagnosis not present

## 2022-12-20 DIAGNOSIS — R55 Syncope and collapse: Principal | ICD-10-CM | POA: Diagnosis present

## 2022-12-20 DIAGNOSIS — Z87440 Personal history of urinary (tract) infections: Secondary | ICD-10-CM

## 2022-12-20 DIAGNOSIS — R531 Weakness: Secondary | ICD-10-CM | POA: Diagnosis not present

## 2022-12-20 DIAGNOSIS — I959 Hypotension, unspecified: Secondary | ICD-10-CM | POA: Diagnosis present

## 2022-12-20 DIAGNOSIS — I10 Essential (primary) hypertension: Secondary | ICD-10-CM | POA: Diagnosis present

## 2022-12-20 DIAGNOSIS — Z7982 Long term (current) use of aspirin: Secondary | ICD-10-CM

## 2022-12-20 DIAGNOSIS — Z8249 Family history of ischemic heart disease and other diseases of the circulatory system: Secondary | ICD-10-CM | POA: Diagnosis not present

## 2022-12-20 DIAGNOSIS — G4700 Insomnia, unspecified: Secondary | ICD-10-CM | POA: Diagnosis present

## 2022-12-20 DIAGNOSIS — M545 Low back pain, unspecified: Secondary | ICD-10-CM | POA: Diagnosis present

## 2022-12-20 DIAGNOSIS — E039 Hypothyroidism, unspecified: Secondary | ICD-10-CM | POA: Diagnosis present

## 2022-12-20 DIAGNOSIS — Z961 Presence of intraocular lens: Secondary | ICD-10-CM | POA: Diagnosis present

## 2022-12-20 DIAGNOSIS — E669 Obesity, unspecified: Secondary | ICD-10-CM | POA: Diagnosis present

## 2022-12-20 DIAGNOSIS — Y92009 Unspecified place in unspecified non-institutional (private) residence as the place of occurrence of the external cause: Secondary | ICD-10-CM | POA: Diagnosis not present

## 2022-12-20 DIAGNOSIS — I3139 Other pericardial effusion (noninflammatory): Secondary | ICD-10-CM | POA: Diagnosis present

## 2022-12-20 DIAGNOSIS — E890 Postprocedural hypothyroidism: Secondary | ICD-10-CM | POA: Diagnosis not present

## 2022-12-20 DIAGNOSIS — N179 Acute kidney failure, unspecified: Secondary | ICD-10-CM | POA: Insufficient documentation

## 2022-12-20 DIAGNOSIS — E876 Hypokalemia: Secondary | ICD-10-CM | POA: Diagnosis not present

## 2022-12-20 DIAGNOSIS — G8929 Other chronic pain: Secondary | ICD-10-CM | POA: Diagnosis present

## 2022-12-20 DIAGNOSIS — K219 Gastro-esophageal reflux disease without esophagitis: Secondary | ICD-10-CM | POA: Diagnosis not present

## 2022-12-20 DIAGNOSIS — Z801 Family history of malignant neoplasm of trachea, bronchus and lung: Secondary | ICD-10-CM

## 2022-12-20 DIAGNOSIS — Z905 Acquired absence of kidney: Secondary | ICD-10-CM | POA: Diagnosis not present

## 2022-12-20 DIAGNOSIS — T500X5A Adverse effect of mineralocorticoids and their antagonists, initial encounter: Secondary | ICD-10-CM | POA: Diagnosis not present

## 2022-12-20 DIAGNOSIS — Z79899 Other long term (current) drug therapy: Secondary | ICD-10-CM

## 2022-12-20 DIAGNOSIS — E7800 Pure hypercholesterolemia, unspecified: Secondary | ICD-10-CM | POA: Diagnosis not present

## 2022-12-20 DIAGNOSIS — R600 Localized edema: Secondary | ICD-10-CM | POA: Diagnosis not present

## 2022-12-20 DIAGNOSIS — E8809 Other disorders of plasma-protein metabolism, not elsewhere classified: Secondary | ICD-10-CM | POA: Insufficient documentation

## 2022-12-20 DIAGNOSIS — Z9841 Cataract extraction status, right eye: Secondary | ICD-10-CM

## 2022-12-20 DIAGNOSIS — Z853 Personal history of malignant neoplasm of breast: Secondary | ICD-10-CM | POA: Diagnosis not present

## 2022-12-20 DIAGNOSIS — G4730 Sleep apnea, unspecified: Secondary | ICD-10-CM | POA: Diagnosis present

## 2022-12-20 DIAGNOSIS — Z90710 Acquired absence of both cervix and uterus: Secondary | ICD-10-CM

## 2022-12-20 DIAGNOSIS — R519 Headache, unspecified: Secondary | ICD-10-CM | POA: Diagnosis not present

## 2022-12-20 DIAGNOSIS — R4701 Aphasia: Secondary | ICD-10-CM | POA: Diagnosis not present

## 2022-12-20 DIAGNOSIS — Z634 Disappearance and death of family member: Secondary | ICD-10-CM | POA: Diagnosis not present

## 2022-12-20 DIAGNOSIS — Z981 Arthrodesis status: Secondary | ICD-10-CM

## 2022-12-20 DIAGNOSIS — Z6835 Body mass index (BMI) 35.0-35.9, adult: Secondary | ICD-10-CM

## 2022-12-20 DIAGNOSIS — J90 Pleural effusion, not elsewhere classified: Secondary | ICD-10-CM | POA: Diagnosis not present

## 2022-12-20 DIAGNOSIS — M25512 Pain in left shoulder: Secondary | ICD-10-CM | POA: Diagnosis present

## 2022-12-20 DIAGNOSIS — F419 Anxiety disorder, unspecified: Secondary | ICD-10-CM | POA: Diagnosis present

## 2022-12-20 DIAGNOSIS — Z9842 Cataract extraction status, left eye: Secondary | ICD-10-CM

## 2022-12-20 LAB — COMPREHENSIVE METABOLIC PANEL
ALT: 32 U/L (ref 0–44)
AST: 50 U/L — ABNORMAL HIGH (ref 15–41)
Albumin: 2.3 g/dL — ABNORMAL LOW (ref 3.5–5.0)
Alkaline Phosphatase: 81 U/L (ref 38–126)
Anion gap: 8 (ref 5–15)
BUN: 15 mg/dL (ref 8–23)
CO2: 32 mmol/L (ref 22–32)
Calcium: 8.1 mg/dL — ABNORMAL LOW (ref 8.9–10.3)
Chloride: 98 mmol/L (ref 98–111)
Creatinine, Ser: 1.2 mg/dL — ABNORMAL HIGH (ref 0.44–1.00)
GFR, Estimated: 47 mL/min — ABNORMAL LOW (ref >60)
Glucose, Bld: 107 mg/dL — ABNORMAL HIGH (ref 70–99)
Potassium: 3.1 mmol/L — ABNORMAL LOW (ref 3.5–5.1)
Sodium: 138 mmol/L (ref 135–145)
Total Bilirubin: 0.6 mg/dL (ref 0.3–1.2)
Total Protein: 5.5 g/dL — ABNORMAL LOW (ref 6.5–8.1)

## 2022-12-20 LAB — TROPONIN I (HIGH SENSITIVITY)
Troponin I (High Sensitivity): 6 ng/L (ref <18)
Troponin I (High Sensitivity): 4 ng/L (ref <18)

## 2022-12-20 LAB — CBC WITH DIFFERENTIAL/PLATELET
Abs Immature Granulocytes: 0.05 10*3/uL (ref 0.00–0.07)
Basophils Absolute: 0.1 10*3/uL (ref 0.0–0.1)
Basophils Relative: 1 %
Eosinophils Absolute: 0.1 10*3/uL (ref 0.0–0.5)
Eosinophils Relative: 1 %
HCT: 41.4 % (ref 36.0–46.0)
Hemoglobin: 13.9 g/dL (ref 12.0–15.0)
Immature Granulocytes: 1 %
Lymphocytes Relative: 14 %
Lymphs Abs: 1.6 10*3/uL (ref 0.7–4.0)
MCH: 32.8 pg (ref 26.0–34.0)
MCHC: 33.6 g/dL (ref 30.0–36.0)
MCV: 97.6 fL (ref 80.0–100.0)
Monocytes Absolute: 0.7 10*3/uL (ref 0.1–1.0)
Monocytes Relative: 6 %
Neutro Abs: 8.7 10*3/uL — ABNORMAL HIGH (ref 1.7–7.7)
Neutrophils Relative %: 77 %
Platelets: 234 10*3/uL (ref 150–400)
RBC: 4.24 MIL/uL (ref 3.87–5.11)
RDW: 13.5 % (ref 11.5–15.5)
WBC: 11.1 10*3/uL — ABNORMAL HIGH (ref 4.0–10.5)
nRBC: 0 % (ref 0.0–0.2)

## 2022-12-20 LAB — CBG MONITORING, ED: Glucose-Capillary: 165 mg/dL — ABNORMAL HIGH (ref 70–99)

## 2022-12-20 LAB — ETHANOL: Alcohol, Ethyl (B): <10 mg/dL (ref <10)

## 2022-12-20 MED ORDER — LEVOTHYROXINE SODIUM 75 MCG PO TABS
150.0000 ug | ORAL_TABLET | Freq: Every day | ORAL | Status: DC
  Administered 2022-12-21 – 2022-12-22 (×2): 150 ug via ORAL
  Filled 2022-12-20 (×2): qty 2

## 2022-12-20 MED ORDER — ASPIRIN 81 MG PO TBEC
81.0000 mg | DELAYED_RELEASE_TABLET | Freq: Every day | ORAL | Status: DC
  Administered 2022-12-21 – 2022-12-22 (×2): 81 mg via ORAL
  Filled 2022-12-20 (×2): qty 1

## 2022-12-20 MED ORDER — OXYCODONE HCL 5 MG PO TABS: 5.0000 mg | ORAL_TABLET | ORAL | Status: DC | PRN

## 2022-12-20 MED ORDER — METOPROLOL TARTRATE 25 MG PO TABS
25.0000 mg | ORAL_TABLET | Freq: Once | ORAL | Status: AC
  Administered 2022-12-20: 25 mg via ORAL
  Filled 2022-12-20: qty 1

## 2022-12-20 MED ORDER — PANTOPRAZOLE SODIUM 40 MG PO TBEC
40.0000 mg | DELAYED_RELEASE_TABLET | Freq: Every day | ORAL | Status: DC
  Administered 2022-12-21 – 2022-12-22 (×2): 40 mg via ORAL
  Filled 2022-12-20 (×2): qty 1

## 2022-12-20 MED ORDER — ACETAMINOPHEN 650 MG RE SUPP: 650.0000 mg | Freq: Four times a day (QID) | RECTAL | Status: DC | PRN

## 2022-12-20 MED ORDER — ATORVASTATIN CALCIUM 10 MG PO TABS
20.0000 mg | ORAL_TABLET | Freq: Every day | ORAL | Status: DC
  Administered 2022-12-21 – 2022-12-22 (×2): 20 mg via ORAL
  Filled 2022-12-20 (×2): qty 2

## 2022-12-20 MED ORDER — METOPROLOL TARTRATE 25 MG PO TABS
25.0000 mg | ORAL_TABLET | Freq: Two times a day (BID) | ORAL | Status: DC
  Filled 2022-12-20: qty 1

## 2022-12-20 MED ORDER — GABAPENTIN 300 MG PO CAPS
600.0000 mg | ORAL_CAPSULE | Freq: Two times a day (BID) | ORAL | Status: DC
  Administered 2022-12-20 – 2022-12-22 (×4): 600 mg via ORAL
  Filled 2022-12-20 (×4): qty 2

## 2022-12-20 MED ORDER — SPIRONOLACTONE 25 MG PO TABS: 25.0000 mg | ORAL_TABLET | Freq: Once | ORAL | Status: DC

## 2022-12-20 MED ORDER — HEPARIN SODIUM (PORCINE) 5000 UNIT/ML IJ SOLN
5000.0000 [IU] | Freq: Three times a day (TID) | INTRAMUSCULAR | Status: DC
  Administered 2022-12-21 – 2022-12-22 (×4): 5000 [IU] via SUBCUTANEOUS
  Filled 2022-12-20 (×4): qty 1

## 2022-12-20 MED ORDER — LACTATED RINGERS IV BOLUS
1000.0000 mL | Freq: Once | INTRAVENOUS | Status: AC
  Administered 2022-12-20: 1000 mL via INTRAVENOUS

## 2022-12-20 MED ORDER — POTASSIUM CHLORIDE CRYS ER 20 MEQ PO TBCR
40.0000 meq | EXTENDED_RELEASE_TABLET | Freq: Once | ORAL | Status: AC
  Administered 2022-12-20: 40 meq via ORAL
  Filled 2022-12-20: qty 2

## 2022-12-20 MED ORDER — OXYBUTYNIN CHLORIDE 5 MG PO TABS
5.0000 mg | ORAL_TABLET | Freq: Two times a day (BID) | ORAL | Status: DC
  Administered 2022-12-21 – 2022-12-22 (×3): 5 mg via ORAL
  Filled 2022-12-20 (×3): qty 1

## 2022-12-20 MED ORDER — ACETAMINOPHEN 325 MG PO TABS
650.0000 mg | ORAL_TABLET | Freq: Four times a day (QID) | ORAL | Status: DC | PRN
  Administered 2022-12-21: 650 mg via ORAL
  Filled 2022-12-20: qty 2

## 2022-12-20 MED ORDER — ONDANSETRON HCL 4 MG/2ML IJ SOLN
4.0000 mg | Freq: Once | INTRAMUSCULAR | Status: AC
  Administered 2022-12-20: 4 mg via INTRAVENOUS
  Filled 2022-12-20: qty 2

## 2022-12-20 MED ORDER — ONDANSETRON HCL 4 MG/2ML IJ SOLN: 4.0000 mg | Freq: Four times a day (QID) | INTRAMUSCULAR | Status: DC | PRN

## 2022-12-20 MED ORDER — SODIUM CHLORIDE 0.9 % IV BOLUS
500.0000 mL | Freq: Once | INTRAVENOUS | Status: AC
  Administered 2022-12-20: 500 mL via INTRAVENOUS

## 2022-12-20 MED ORDER — ONDANSETRON HCL 4 MG PO TABS: 4.0000 mg | ORAL_TABLET | Freq: Four times a day (QID) | ORAL | Status: DC | PRN

## 2022-12-20 NOTE — ED Triage Notes (Signed)
Pt here from home with c/o ALOC  per EMS pt sat down was incontinent of urine ,cbg 137 , no unilateral weakness, pt alert on arrival but slow to respond

## 2022-12-20 NOTE — ED Provider Notes (Signed)
[facility] Provider Note   CSN: [id number] Arrival date & time: 12/20/22  1819     History  No chief complaint on file.   Sara [name] is a 77 y.o. female.  HPI 77 year old female with a history of multiple comorbidities which include but are not limited to Graves' disease, hypertension, obesity, sleep apnea, presents with syncope.  History is primarily from the patient and husband.  The patient had an episode around 5 PM after dinner where she felt very hot and then things got very dark.  She does not know what happened after that.  She did develop a headache during that time though it was not severe in onset.  She still has a headache right now and is also feeling nauseated.  Has some chest burning/reflux as well.  She has chronic leg swelling that is unchanged.  The husband reports that he did not witness the episode but apparently the patient collapsed and was hard to wake up for a small period of time.  However she did not seem to come back to normal for several minutes or longer.  The patient was apparently confused for a prolonged period of time but did not have any seizure-like activity.  The husband is seeing her at the same time as me and states that it seems like her mental status is back to normal. No preceding illness/symptoms.  Home Medications Prior to Admission medications   Medication Sig Start Date End Date Taking? Authorizing Provider  acetaminophen (TYLENOL) 500 MG tablet Take 650 mg by mouth in the morning and at bedtime.    [provider]  aspirin EC 81 MG tablet Take 81 mg by mouth daily. Swallow whole.    [provider]  atorvastatin (LIPITOR) 20 MG tablet Take 1 tablet (20 mg total) by mouth daily. 03/07/20 07/17/22  Georgette Shell, MD  diphenhydrAMINE (BENADRYL) 50 MG capsule Take 50 mg by mouth at bedtime.    [provider]  gabapentin (NEURONTIN) 600 MG tablet TAKE 1 TABLET BY  MOUTH TWICE  DAILY 10/19/22   Carole Civil, MD  levothyroxine (SYNTHROID) 100 MCG tablet Take 100 mcg by mouth daily. 11/12/21   [provider]  metoprolol tartrate (LOPRESSOR) 25 MG tablet Take 25 mg ( 1 tablet ) in morning and take ( 1/2  tablet ) 12.5 mg at night 07/01/22   Troy Sine, MD  Multiple Vitamins-Minerals (CENTRUM SILVER PO) Take 1 tablet by mouth daily.    [provider]  omeprazole (PRILOSEC) 20 MG capsule Take 20 mg by mouth daily. May take a second 20 mg dose as needed for heartburn    [provider]  oxybutynin (DITROPAN) 5 MG tablet Take 5 mg by mouth 2 (two) times daily.  07/31/15   [provider]  spironolactone (ALDACTONE) 25 MG tablet TAKE 1/2 TABLET DAILY 11/27/22   Loel Dubonnet, NP  torsemide (DEMADEX) 20 MG tablet Take 2 tablets ( 40 mg ) alternating 1 tablet ( 20 mg ) every other day 07/01/22   Troy Sine, MD  Vitamin D, Ergocalciferol, (DRISDOL) 1.25 MG (50000 UNIT) CAPS capsule TAKE 1 CAPSULE BY MOUTH WEEKLY 07/10/22   Dede Query T, PA-C  Ranitidine HCl (ZANTAC 75 PO) Take by mouth.    12/01/11  [provider]      Allergies    Propoxyphene hcl, Pollen extract, Camphor, Dust mite extract, Latex, Molds & smuts, and Tomato  Review of Systems   Review of Systems  Constitutional:  Negative for fever.  Cardiovascular:  Positive for chest pain.  Gastrointestinal:  Positive for nausea. Negative for abdominal pain.  Neurological:  Positive for syncope and headaches.    Physical Exam Updated Vital Signs BP 102/66   Pulse 67   Temp 98 F (36.7 C) (Oral)   Resp 18   SpO2 91%  Physical Exam Vitals and nursing note reviewed.  Constitutional:      Appearance: She is well-developed.  HENT:     Head: Normocephalic and atraumatic.  Eyes:     Extraocular Movements: Extraocular movements intact.     Pupils: Pupils are equal, round, and reactive to light.  Cardiovascular:     Rate and Rhythm:  Normal rate and regular rhythm.     Heart sounds: Normal heart sounds.  Pulmonary:     Effort: Pulmonary effort is normal.     Breath sounds: Normal breath sounds.  Abdominal:     General: There is no distension.     Palpations: Abdomen is soft.     Tenderness: There is no abdominal tenderness.  Skin:    General: Skin is warm and dry.  Neurological:     Mental Status: She is alert and oriented to person, place, and time.     Comments: Is slow to respond but able to answer all questions appropriately and speaks softly.  She has no focal deficits though is generally weak in all 4 extremities.     ED Results / Procedures / Treatments   Labs (all labs ordered are listed, but only abnormal results are displayed) Labs Reviewed  COMPREHENSIVE METABOLIC PANEL - Abnormal; Notable for the following components:      Result Value   Potassium 3.1 (*)    Glucose, Bld 107 (*)    Creatinine, Ser 1.20 (*)    Calcium 8.1 (*)    Total Protein 5.5 (*)    Albumin 2.3 (*)    AST 50 (*)    GFR, Estimated 47 (*)    All other components within normal limits  CBC WITH DIFFERENTIAL/PLATELET - Abnormal; Notable for the following components:   WBC 11.1 (*)    Neutro Abs 8.7 (*)    All other components within normal limits  CBG MONITORING, ED - Abnormal; Notable for the following components:   Glucose-Capillary 165 (*)    All other components within normal limits  ETHANOL  TROPONIN I (HIGH SENSITIVITY)  TROPONIN I (HIGH SENSITIVITY)    EKG EKG Interpretation  Date/Time:  Sunday December 20 2022 18:37:26 EDT Ventricular Rate:  66 PR Interval:  162 QRS Duration: 76 QT Interval:  442 QTC Calculation: 463 R Axis:   -46 Text Interpretation: Normal sinus rhythm Left axis deviation Low voltage QRS Nonspecific T wave abnormality Confirmed by Sherwood Gambler [phone]) on 12/20/2022 6:41:44 PM  Radiology CT Head Wo Contrast  Result Date: 12/20/2022 CLINICAL DATA:  Sudden severe headache.  Unilateral  weakness EXAM: CT HEAD WITHOUT CONTRAST TECHNIQUE: Contiguous axial images were obtained from the base of the skull through the vertex without intravenous contrast. RADIATION DOSE REDUCTION: This exam was performed according to the departmental dose-optimization program which includes automated exposure control, adjustment of the mA and/or kV according to patient size and/or use of iterative reconstruction technique. COMPARISON:  10/30/2020 FINDINGS: Brain: The brain shows a normal appearance without evidence of malformation, atrophy, old or acute small or large vessel infarction, mass lesion, hemorrhage, hydrocephalus or extra-axial collection.  Vascular: There is atherosclerotic calcification of the major vessels at the base of the brain. Skull: Normal.  No traumatic finding.  No focal bone lesion. Sinuses/Orbits: Sinuses are clear. Orbits appear normal. Mastoids are clear. Other: None significant IMPRESSION: No acute finding. Normal appearance of the brain for age. Atherosclerotic calcification of the major vessels at the base of the brain. Electronically Signed   By: Nelson Chimes M.D.   On: 12/20/2022 20:11   DG Chest Portable 1 View  Result Date: 12/20/2022 CLINICAL DATA:  Syncope EXAM: PORTABLE CHEST 1 VIEW COMPARISON:  03/04/2020 FINDINGS: Lungs are clear. No pneumothorax or pleural effusion. Cardiac size within normal limits. Pulmonary vascularity is normal. Osseous structures are age-appropriate. No acute bone abnormality. IMPRESSION: 1. No active disease. Electronically Signed   By: Fidela Salisbury M.D.   On: 12/20/2022 19:49    Procedures Procedures    Medications Ordered in ED Medications  sodium chloride 0.9 % bolus 500 mL (0 mLs Intravenous Stopped 12/20/22 2112)  ondansetron (ZOFRAN) injection 4 mg (4 mg Intravenous Given 12/20/22 1950)  potassium chloride SA (KLOR-CON M) CR tablet 40 mEq (40 mEq Oral Given 12/20/22 2109)  lactated ringers bolus 1,000 mL (1,000 mLs Intravenous New Bag/Given  12/20/22 2114)    ED Course/ Medical Decision Making/ A&P                             Medical Decision Making Amount and/or Complexity of Data Reviewed Labs: ordered.    Details: Normal troponin, mild hypokalemia. Radiology: ordered and independent interpretation performed.    Details: No head bleed on head CT. ECG/medicine tests: ordered and independent interpretation performed.    Details: No acute ischemia.  Risk Prescription drug management. Decision regarding hospitalization.   Patient presents with what sounds like syncope but unprovoked.  She did have a mild headache associated with this but no thunderclap component.  Head CT is negative.  Given this was obtained within just a couple hours and certainly less than 6 hours of onset combined with no thunderclap component, subarachnoid hemorrhage is unlikely.  Sound like it was syncope though family indicates that she was having a prolonged confusion episode and so seizure is a possibility though no seizure-like activity was witnessed by family.  She is orthostatic here although that does not really go with her symptoms as she had been sitting the whole time but will give further fluids.  At this point, she will need admission and I discussed with hospitalist, Dr. Clearence Ped for admission for syncope.        Final Clinical Impression(s) / ED Diagnoses Final diagnoses:  Syncope and collapse    Rx / DC Orders ED Discharge Orders     None         Sherwood Gambler, MD 12/20/22 2203

## 2022-12-21 ENCOUNTER — Observation Stay (HOSPITAL_COMMUNITY): Payer: Medicare Other

## 2022-12-21 DIAGNOSIS — Z634 Disappearance and death of family member: Secondary | ICD-10-CM | POA: Diagnosis not present

## 2022-12-21 DIAGNOSIS — Y92009 Unspecified place in unspecified non-institutional (private) residence as the place of occurrence of the external cause: Secondary | ICD-10-CM | POA: Diagnosis not present

## 2022-12-21 DIAGNOSIS — Z6835 Body mass index (BMI) 35.0-35.9, adult: Secondary | ICD-10-CM | POA: Diagnosis not present

## 2022-12-21 DIAGNOSIS — Z8249 Family history of ischemic heart disease and other diseases of the circulatory system: Secondary | ICD-10-CM | POA: Diagnosis not present

## 2022-12-21 DIAGNOSIS — Z79890 Hormone replacement therapy (postmenopausal): Secondary | ICD-10-CM | POA: Diagnosis not present

## 2022-12-21 DIAGNOSIS — F32A Depression, unspecified: Secondary | ICD-10-CM | POA: Diagnosis present

## 2022-12-21 DIAGNOSIS — I10 Essential (primary) hypertension: Secondary | ICD-10-CM

## 2022-12-21 DIAGNOSIS — R55 Syncope and collapse: Secondary | ICD-10-CM

## 2022-12-21 DIAGNOSIS — E039 Hypothyroidism, unspecified: Secondary | ICD-10-CM | POA: Diagnosis not present

## 2022-12-21 DIAGNOSIS — Z96653 Presence of artificial knee joint, bilateral: Secondary | ICD-10-CM | POA: Diagnosis present

## 2022-12-21 DIAGNOSIS — N179 Acute kidney failure, unspecified: Secondary | ICD-10-CM

## 2022-12-21 DIAGNOSIS — T500X5A Adverse effect of mineralocorticoids and their antagonists, initial encounter: Secondary | ICD-10-CM | POA: Diagnosis present

## 2022-12-21 DIAGNOSIS — E8809 Other disorders of plasma-protein metabolism, not elsewhere classified: Secondary | ICD-10-CM

## 2022-12-21 DIAGNOSIS — E7800 Pure hypercholesterolemia, unspecified: Secondary | ICD-10-CM | POA: Diagnosis present

## 2022-12-21 DIAGNOSIS — E785 Hyperlipidemia, unspecified: Secondary | ICD-10-CM

## 2022-12-21 DIAGNOSIS — I959 Hypotension, unspecified: Secondary | ICD-10-CM | POA: Diagnosis present

## 2022-12-21 DIAGNOSIS — K219 Gastro-esophageal reflux disease without esophagitis: Secondary | ICD-10-CM | POA: Diagnosis present

## 2022-12-21 DIAGNOSIS — Z7982 Long term (current) use of aspirin: Secondary | ICD-10-CM | POA: Diagnosis not present

## 2022-12-21 DIAGNOSIS — E669 Obesity, unspecified: Secondary | ICD-10-CM | POA: Diagnosis present

## 2022-12-21 DIAGNOSIS — E860 Dehydration: Secondary | ICD-10-CM | POA: Diagnosis present

## 2022-12-21 DIAGNOSIS — Z823 Family history of stroke: Secondary | ICD-10-CM | POA: Diagnosis not present

## 2022-12-21 DIAGNOSIS — E876 Hypokalemia: Secondary | ICD-10-CM | POA: Diagnosis not present

## 2022-12-21 DIAGNOSIS — I3139 Other pericardial effusion (noninflammatory): Secondary | ICD-10-CM | POA: Diagnosis present

## 2022-12-21 DIAGNOSIS — Z87891 Personal history of nicotine dependence: Secondary | ICD-10-CM | POA: Diagnosis not present

## 2022-12-21 DIAGNOSIS — E890 Postprocedural hypothyroidism: Secondary | ICD-10-CM | POA: Diagnosis present

## 2022-12-21 DIAGNOSIS — Z905 Acquired absence of kidney: Secondary | ICD-10-CM | POA: Diagnosis not present

## 2022-12-21 DIAGNOSIS — Z853 Personal history of malignant neoplasm of breast: Secondary | ICD-10-CM | POA: Diagnosis not present

## 2022-12-21 LAB — CBC WITH DIFFERENTIAL/PLATELET
Abs Immature Granulocytes: 0.02 10*3/uL (ref 0.00–0.07)
Basophils Absolute: 0 10*3/uL (ref 0.0–0.1)
Basophils Relative: 1 %
Eosinophils Absolute: 0.1 10*3/uL (ref 0.0–0.5)
Eosinophils Relative: 1 %
HCT: 36.6 % (ref 36.0–46.0)
Hemoglobin: 12.1 g/dL (ref 12.0–15.0)
Immature Granulocytes: 0 %
Lymphocytes Relative: 38 %
Lymphs Abs: 3 10*3/uL (ref 0.7–4.0)
MCH: 32.8 pg (ref 26.0–34.0)
MCHC: 33.1 g/dL (ref 30.0–36.0)
MCV: 99.2 fL (ref 80.0–100.0)
Monocytes Absolute: 0.7 10*3/uL (ref 0.1–1.0)
Monocytes Relative: 8 %
Neutro Abs: 4.1 10*3/uL (ref 1.7–7.7)
Neutrophils Relative %: 52 %
Platelets: 216 10*3/uL (ref 150–400)
RBC: 3.69 MIL/uL — ABNORMAL LOW (ref 3.87–5.11)
RDW: 13.5 % (ref 11.5–15.5)
WBC: 7.9 10*3/uL (ref 4.0–10.5)
nRBC: 0 % (ref 0.0–0.2)

## 2022-12-21 LAB — ECHOCARDIOGRAM LIMITED
Area-P 1/2: 2.6 cm2
S' Lateral: 2 cm

## 2022-12-21 LAB — COMPREHENSIVE METABOLIC PANEL
ALT: 25 U/L (ref 0–44)
AST: 35 U/L (ref 15–41)
Albumin: 1.8 g/dL — ABNORMAL LOW (ref 3.5–5.0)
Alkaline Phosphatase: 66 U/L (ref 38–126)
Anion gap: 8 (ref 5–15)
BUN: 17 mg/dL (ref 8–23)
CO2: 29 mmol/L (ref 22–32)
Calcium: 7.9 mg/dL — ABNORMAL LOW (ref 8.9–10.3)
Chloride: 101 mmol/L (ref 98–111)
Creatinine, Ser: 1.24 mg/dL — ABNORMAL HIGH (ref 0.44–1.00)
GFR, Estimated: 45 mL/min — ABNORMAL LOW (ref >60)
Glucose, Bld: 95 mg/dL (ref 70–99)
Potassium: 4.5 mmol/L (ref 3.5–5.1)
Sodium: 138 mmol/L (ref 135–145)
Total Bilirubin: 0.5 mg/dL (ref 0.3–1.2)
Total Protein: 4.8 g/dL — ABNORMAL LOW (ref 6.5–8.1)

## 2022-12-21 LAB — MAGNESIUM: Magnesium: 2.2 mg/dL (ref 1.7–2.4)

## 2022-12-21 MED ORDER — SODIUM CHLORIDE 0.9 % IV SOLN: INTRAVENOUS | Status: DC

## 2022-12-21 MED ORDER — SODIUM CHLORIDE 0.9 % IV BOLUS
500.0000 mL | Freq: Once | INTRAVENOUS | Status: AC
  Administered 2022-12-21: 500 mL via INTRAVENOUS

## 2022-12-21 NOTE — Progress Notes (Signed)
[facility] PROGRESS NOTE   Sara [name] [id number] DOB: 29-Sep-1945 DOA: 12/20/2022  PCP: [name], MD  Brief History/Interval Summary: 77 y.o. female with medical history significant of anxiety, GERD, hypothyroidism, hypertension, hyperlipidemia, and more presented to the ED with a chief complaint of syncope and collapse.  Patient reports that she had been out to a restaurant for dinner.  She had left the table and got over to the counter to sit on the stool and playing her game.  She was playing game on her phone.  She collapsed.    Consultants: None yet  Procedures: Echocardiogram is pending    Subjective/Interval History: Denies any dizziness or lightheadedness this morning.  Did have some burning sensation in her chest area yesterday but none this morning.  No nausea or vomiting.  Husband is at the bedside.     Assessment/Plan:  Syncope History not suggestive of orthostatic hypotension.  Orthostatic vital signs last night did not show to drop in blood pressure actually blood pressure increased but did show an increase in the heart rate.  Patient denies any reason to have dehydration.  She is noted to be hypotensive. Unknown if she has had any new medication changes.  Will discuss with her. Follow-up on echocardiogram and carotid Doppler. Continue with IV fluids for now.  PT and OT evaluation She did have burning sensation in her chest last night but none this morning.  Troponins have been normal.  EKG did not show any ischemic changes.  Will follow-up on the echocardiogram.  Acute kidney injury Baseline renal function is normal.  Came in with creatinine of 1.2.  Was given fluid bolus but not maintained on IV fluids.  Creatinine is same as last night.  She is somewhat hypotensive as well.  Will give her fluid bolus.  Placed on maintenance IV fluids.  Hypotension Hold her metoprolol as well as spironolactone.  Monitor blood pressures after she gets her fluid  bolus.  Hypokalemia Repleted.  Magnesium is 2.2.  Hypoalbuminemia Albumin is 1.8 today total protein is 4.8.  Calcium level when corrected for low albumin is normal.  She does not have anemia or thrombocytopenia. Will recommend this be monitored in the outpatient setting.  She may need further workup as well.  Hyperlipidemia Statin.  Hypothyroidism Continue levothyroxine.  Obesity Estimated body mass index is 35.86 kg/m as calculated from the following:   Height as of 11/20/22: 5\' 6"  (1.676 m).   Weight as of 12/16/22: 100.8 kg.   DVT Prophylaxis: Subcutaneous heparin Code Status: Full code Family Communication: Discussed with the patient and her husband Disposition Plan: Anticipate home when improved  Status is: Observation The patient will require care spanning > 2 midnights and should be moved to inpatient because: Hypotension, acute kidney injury, syncope      Medications: Scheduled:  aspirin EC  81 mg Oral Daily   atorvastatin  20 mg Oral Daily   gabapentin  600 mg Oral BID   heparin  5,000 Units Subcutaneous Q8H   levothyroxine  150 mcg Oral Daily   oxybutynin  5 mg Oral BID   pantoprazole  40 mg Oral Daily   Continuous:  sodium chloride 100 mL/hr at 12/21/22 1030   [id number] **OR** acetaminophen, ondansetron **OR** ondansetron (ZOFRAN) IV, oxyCODONE  Antibiotics: Anti-infectives (From admission, onward)    None       Objective:  Vital Signs  Vitals:   12/20/22 1832 12/20/22 2224 12/21/22 0522 12/21/22 0919  BP: 102/66 104/66 Marland Kitchen)  101/57 (!) 97/51  Pulse: 67 75 61   Resp: 18 18 18    Temp: 98 F (36.7 C) 98 F (36.7 C)    TempSrc: Oral Oral    SpO2: 91% 97%  96%    Intake/Output Summary (Last 24 hours) at 12/21/2022 1032 Last data filed at 12/20/2022 2112 Gross per 24 hour  Intake 500 ml  Output --  Net 500 ml   There were no vitals filed for this visit.  General appearance: Awake alert.  In no distress Resp: Clear to  auscultation bilaterally.  Normal effort Cardio: S1-S2 is normal regular.  No S3-S4.  No rubs murmurs or bruit GI: Abdomen is soft.  Nontender nondistended.  Bowel sounds are present normal.  No masses organomegaly Extremities: No edema.  Full range of motion of lower extremities. Neurologic: Alert and oriented x3.  No focal neurological deficits.    Lab Results:  Data Reviewed: I have personally reviewed following labs and reports of the imaging studies  CBC: Recent Labs  Lab 12/20/22 1949 12/21/22 0412  WBC 11.1* 7.9  NEUTROABS 8.7* 4.1  HGB 13.9 12.1  HCT 41.4 36.6  MCV 97.6 99.2  PLT 234 [id number]    Basic Metabolic Panel: Recent Labs  Lab 12/20/22 1949 12/21/22 0412  NA 138 138  K 3.1* 4.5  CL 98 101  CO2 32 29  GLUCOSE 107* 95  BUN 15 17  CREATININE 1.20* 1.24*  CALCIUM 8.1* 7.9*  MG  --  2.2    GFR: Estimated Creatinine Clearance: 47 mL/min (A) (by C-G formula based on SCr of 1.24 mg/dL (H)).  Liver Function Tests: Recent Labs  Lab 12/20/22 1949 12/21/22 0412  AST 50* 35  ALT 32 25  ALKPHOS 81 66  BILITOT 0.6 0.5  PROT 5.5* 4.8*  ALBUMIN 2.3* 1.8*    CBG: Recent Labs  Lab 12/20/22 1842  GLUCAP 165*     Radiology Studies: CT Head Wo Contrast  Result Date: 12/20/2022 CLINICAL DATA:  Sudden severe headache.  Unilateral weakness EXAM: CT HEAD WITHOUT CONTRAST TECHNIQUE: Contiguous axial images were obtained from the base of the skull through the vertex without intravenous contrast. RADIATION DOSE REDUCTION: This exam was performed according to the departmental dose-optimization program which includes automated exposure control, adjustment of the mA and/or kV according to patient size and/or use of iterative reconstruction technique. COMPARISON:  10/30/2020 FINDINGS: Brain: The brain shows a normal appearance without evidence of malformation, atrophy, old or acute small or large vessel infarction, mass lesion, hemorrhage, hydrocephalus or extra-axial  collection. Vascular: There is atherosclerotic calcification of the major vessels at the base of the brain. Skull: Normal.  No traumatic finding.  No focal bone lesion. Sinuses/Orbits: Sinuses are clear. Orbits appear normal. Mastoids are clear. Other: None significant IMPRESSION: No acute finding. Normal appearance of the brain for age. Atherosclerotic calcification of the major vessels at the base of the brain. Electronically Signed   By: Nelson Chimes M.D.   On: 12/20/2022 20:11   DG Chest Portable 1 View  Result Date: 12/20/2022 CLINICAL DATA:  Syncope EXAM: PORTABLE CHEST 1 VIEW COMPARISON:  03/04/2020 FINDINGS: Lungs are clear. No pneumothorax or pleural effusion. Cardiac size within normal limits. Pulmonary vascularity is normal. Osseous structures are age-appropriate. No acute bone abnormality. IMPRESSION: 1. No active disease. Electronically Signed   By: Fidela Salisbury M.D.   On: 12/20/2022 19:49       LOS: 0 days   [facility] Pager on  [email or web address]  12/21/2022, 10:32 AM

## 2022-12-21 NOTE — Assessment & Plan Note (Signed)
-  Stable overall -Continue current antihypertensive agents (Lopressor and Lasix). -Heart healthy/low-sodium diet discussed with patient. 

## 2022-12-21 NOTE — Assessment & Plan Note (Signed)
-   CT head-no acute abnormality - EKG shows a heart rate of 66, normal sinus rhythm, QTc 463 - Orthostatics were slightly positive, but patient was not standing when this happened - 500 mL bolus given in the ED - Check echo in the a.m. - Check carotid ultrasound in the a.m. - Troponin normal at 6, then 4 - Monitor on telemetry - Continue to monitor

## 2022-12-21 NOTE — Assessment & Plan Note (Signed)
-   Creatinine 0.9>> 1.2 - Likely related to poor p.o. intake while on diuretics - Hold diuretics - Avoid nephrotoxic agents when possible - Trend in the a.m.

## 2022-12-21 NOTE — Assessment & Plan Note (Signed)
Continue statin. 

## 2022-12-21 NOTE — Assessment & Plan Note (Signed)
-   Albumin 2.3 - Encourage nutrient dense food options when possible

## 2022-12-21 NOTE — Progress Notes (Signed)
Pt care in progress.

## 2022-12-21 NOTE — H&P (Signed)
History and Physical    Patient: Sara [name] [id number] DOB: 1946/02/02 DOA: 12/20/2022 DOS: the patient was seen and examined on 12/21/2022 PCP: [name], MD  Patient coming from: Home  Chief Complaint: No chief complaint on file.  HPI: Sara [name] is a 77 y.o. female with medical history significant of anxiety, GERD, hypothyroidism, hypertension, hyperlipidemia, and more presents the ED with a chief complaint of syncope and collapse.  Patient reports that she had been out to a restaurant for dinner.  She had left the table and got over to the counter to sit on the stool and playing her game.  She was playing game on her phone.  She collapsed.  She reports she had already finished her dinner at this point.  Before collapsing she reports feeling flushed and like the room around her was fading.  The Nexium she remembers was being in the ambulance.  Leading up to that she was in her normal state of health.  She is not sure how long she was out.  She did not feel any pains when she woke up to indicate that she would have hit her head or hip or anything.  Patient reports that at home for the past few days she has been having a burning pain in her chest occasionally.  She reports it is unpredictable.  Can happen when she is laying on the couch or when she is doing something else.  It is not associated with any nausea.  Nothing makes it worse.  Pressure makes it go away.  She reports it is associated with the pain in her left shoulder blade.  Altogether is there for about a minute or so before it resolves.  Patient denies fever and seizures.  Patient has no other complaints at this time.  Patient does not smoke, does not drink, is not vaccinated for COVID or flu.  Patient is full code. Review of Systems: As mentioned in the history of present illness. All other systems reviewed and are negative. Past Medical History:  Diagnosis Date   Allergic rhinitis due to pollen    Anxiety     Arthritis    "knees, back, right elbow; left shoulder; right ankle" (11/20/2014)   Breast cancer, right breast ([place]) 2016   "DCIS; zero stage" S/P mastectomy, cancer free since then   Cervical spondylosis without myelopathy    Cervicalgia    Chronic lower back pain    Constipation    takes Miralax daily   Degeneration of cervical intervertebral disc    Depression    Diverticulosis of colon (without mention of hemorrhage)    GERD (gastroesophageal reflux disease)    takes Omeprazole daily   Graves' disease    "took a pill to correct"   H/O hiatal hernia    H/O urinary frequency    Headache(784.0)    denies migraines since the 80's but has occ and takes Butalbital prn   HTN (hypertension)    takes Metoprolol and Lisinopril daily   Insomnia    takes Ativan nigtly   Joint pain    "all of them"   Joint swelling    "knees, legs, ankles sometimes" (11/20/2014)   Morbid obesity ([place])    Numbness    LOWER LEGS   Other postablative hypothyroidism    takes SYnthroid daily   Palpitations    Peptic ulcer, unspecified site, unspecified as acute or chronic, without mention of hemorrhage, perforation, or obstruction    Primary localized osteoarthrosis, lower  leg    Pure hypercholesterolemia    takes Pravastin daily   Recurrent UTI    Renal cell carcinoma ([place]) 01/08/2016   Right renal mass s/p partial right nephrectomy, cancer free since 2017   Shortness of breath    Sleep apnea    slight but doesn't require a CPAP (11/20/2014)   Tendonitis of knee    bilateral   Past Surgical History:  Procedure Laterality Date   ABDOMINAL HYSTERECTOMY     ANTERIOR LAT LUMBAR FUSION  05/13/2012   Procedure: ANTERIOR LATERAL LUMBAR FUSION 2 LEVELS;  Surgeon: Faythe Ghee, MD;  Location: [facility];  Service: Neurosurgery;  Laterality: Left;  Left Lumbar Three-four,Lumbar four-five Extreme Lumbar Interbody Fusion with Percutaneous Pedicle Screws   APPENDECTOMY     BREAST BIOPSY Left ~ 2014   BREAST  BIOPSY Right 2015   BREAST LUMPECTOMY Left 1988   BREAST RECONSTRUCTION WITH PLACEMENT OF TISSUE EXPANDER AND FLEX HD (ACELLULAR HYDRATED DERMIS) Right 11/20/2014   BREAST RECONSTRUCTION WITH PLACEMENT OF TISSUE EXPANDER AND FLEX HD (ACELLULAR HYDRATED DERMIS) Right 11/20/2014   Procedure: RIGHT BREAST RECONSTRUCTION WITH PLACEMENT OF TISSUE EXPANDER AND ACELLULAR DERMA MATRIX (ACELLULAR DERMA MATRIX);  Surgeon: Crissie Reese, MD;  Location: [facility];  Service: Plastics;  Laterality: Right;   CATARACT EXTRACTION W/ INTRAOCULAR LENS  IMPLANT, BILATERAL Bilateral    COLONOSCOPY  Sept 2008   RMR: normal rectum, left-sided diverticula, repeat in 2018   COLONOSCOPY WITH PROPOFOL N/A 03/21/2018   Dr. Gala Romney: diverticulosis, internal grade I hemorrhoids. no future colonoscopies unless develops new symptoms   DIAGNOSTIC LAPAROSCOPY     DILATION AND CURETTAGE OF UTERUS     ESOPHAGOGASTRODUODENOSCOPY     ESOPHAGOGASTRODUODENOSCOPY  2013   Dr. Gala Romney: Cervical esophageal web and Schatzki's ring s/p dilation, small hiatal hernia, antral and duodenal erosions likely NSAID effect, chronic duodenitis on path    ESOPHAGOGASTRODUODENOSCOPY (EGD) WITH PROPOFOL N/A 03/21/2018   Dr. Gala Romney: mild Schatzki ring s/p dilation   FOREIGN BODY REMOVAL Right 10/06/2013   Procedure: REMOVAL FOREIGN BODY EXTREMITY;  Surgeon: Carole Civil, MD;  Location: [facility];  Service: Orthopedics;  Laterality: Right;   INJECTION KNEE Left 10/06/2013   Procedure: KNEE INJECTION;  Surgeon: Carole Civil, MD;  Location: [facility];  Service: Orthopedics;  Laterality: Left;   JOINT REPLACEMENT Bilateral    knees   KNEE ARTHROSCOPY Right    KNEE ARTHROSCOPY WITH LATERAL MENISECTOMY Right 10/06/2013   Procedure: KNEE ARTHROSCOPY WITH LATERAL AND MEDIAL MENISECTOMY;  Surgeon: Carole Civil, MD;  Location: [facility];  Service: Orthopedics;  Laterality: Right;  END @ 1234   lumpectomy on pelvis     "mass of nerves"   [place] N/A 03/21/2018    Procedure: MALONEY DILATION;  Surgeon: Daneil Dolin, MD;  [facility];  Service: Endoscopy;  Laterality: N/A;   MASTECTOMY COMPLETE / SIMPLE W/ SENTINEL NODE BIOPSY Right 11/20/2014   axillary   REMOVAL OF TISSUE EXPANDER AND PLACEMENT OF IMPLANT Right 12/13/2014   Procedure: REMOVAL OF RIGHT BREAST TISSUE EXPANDER;  Surgeon: Crissie Reese, MD;  Location: [facility];  Service: Plastics;  Laterality: Right;   ROBOTIC ASSITED PARTIAL NEPHRECTOMY Right 01/08/2016   Procedure: XI ROBOTIC ASSITED PARTIAL NEPHRECTOMY;  Surgeon: Alexis Frock, MD;  Location: [facility];  Service: Urology;  Laterality: Right;   SIMPLE MASTECTOMY WITH AXILLARY SENTINEL NODE BIOPSY Right 11/20/2014   Procedure: SIMPLE MASTECTOMY WITH AXILLARY SENTINEL NODE BIOPSY;  Surgeon: Erroll Luna, MD;  Location: [facility];  Service: General;  Laterality: Right;   TISSUE EXPANDER REMOVAL Right 12/13/2014   dr Harlow Mares   TONSILLECTOMY     TOTAL KNEE ARTHROPLASTY Right 01/24/2014   Procedure: TOTAL KNEE ARTHROPLASTY;  Surgeon: Carole Civil, MD;  Location: [facility];  Service: Orthopedics;  Laterality: Right;   TOTAL KNEE ARTHROPLASTY Left 08/08/2014   Procedure: TOTAL KNEE ARTHROPLASTY;  Surgeon: Carole Civil, MD;  Location: [facility];  Service: Orthopedics;  Laterality: Left;   TUBAL LIGATION     UPPER GASTROINTESTINAL ENDOSCOPY     Social History:  reports that she quit smoking about 37 years ago. Her smoking use included cigarettes. She has a 27.00 pack-year smoking history. She has never used smokeless tobacco. She reports current alcohol use. She reports that she does not use drugs.  Allergies  Allergen Reactions   Propoxyphene Hcl Other (See Comments)     Hallucinations   Pollen Extract Other (See Comments)    Sneezing, congestion   Camphor Itching   Dust Mite Extract Itching    Sneezing. Runny nose    Latex Dermatitis and Rash   Molds & Smuts Itching   Tomato Hives    Family History  Problem Relation Age of Onset    Stroke Sister    Heart attack Sister        In her 56s   Osteoarthritis Sister    Heart attack Sister        In her 62s   Osteoarthritis Brother    Obesity Brother    Heart attack Sister        In her 62s   Heart attack Father        He died at age 6   Lung cancer Maternal Grandmother        non-smoker   Colon cancer Neg Hx        Mother died at 22 from blood clot, MI    Prior to Admission medications   Medication Sig Start Date End Date Taking? Authorizing Provider  acetaminophen (TYLENOL) 500 MG tablet Take 650 mg by mouth in the morning and at bedtime.    [provider]  aspirin EC 81 MG tablet Take 81 mg by mouth daily. Swallow whole.    [provider]  atorvastatin (LIPITOR) 20 MG tablet Take 1 tablet (20 mg total) by mouth daily. 03/07/20 07/17/22  Georgette Shell, MD  diphenhydrAMINE (BENADRYL) 50 MG capsule Take 50 mg by mouth at bedtime.    [provider]  gabapentin (NEURONTIN) 600 MG tablet TAKE 1 TABLET BY MOUTH TWICE  DAILY 10/19/22   Carole Civil, MD  levothyroxine (SYNTHROID) 100 MCG tablet Take 100 mcg by mouth daily. 11/12/21   [provider]  metoprolol tartrate (LOPRESSOR) 25 MG tablet Take 25 mg ( 1 tablet ) in morning and take ( 1/2  tablet ) 12.5 mg at night 07/01/22   Troy Sine, MD  Multiple Vitamins-Minerals (CENTRUM SILVER PO) Take 1 tablet by mouth daily.    [provider]  omeprazole (PRILOSEC) 20 MG capsule Take 20 mg by mouth daily. May take a second 20 mg dose as needed for heartburn    [provider]  oxybutynin (DITROPAN) 5 MG tablet Take 5 mg by mouth 2 (two) times daily.  07/31/15   [provider]  spironolactone (ALDACTONE) 25 MG tablet TAKE 1/2 TABLET DAILY 11/27/22   Loel Dubonnet, NP  torsemide (DEMADEX) 20 MG tablet Take  2 tablets ( 40 mg ) alternating 1 tablet ( 20 mg ) every other day 07/01/22   Troy Sine, MD  Vitamin D, Ergocalciferol, (DRISDOL)  1.25 MG (50000 UNIT) CAPS capsule TAKE 1 CAPSULE BY MOUTH WEEKLY 07/10/22   Dede Query T, PA-C  Ranitidine HCl (ZANTAC 75 PO) Take by mouth.    12/01/11  [provider]    Physical Exam: Vitals:   12/20/22 1832 12/20/22 2224  BP: 102/66 104/66  Pulse: 67 75  Resp: 18 18  Temp: 98 F (36.7 C) 98 F (36.7 C)  TempSrc: Oral Oral  SpO2: 91% 97%   1.  General: Patient lying supine in bed,  no acute distress   2. Psychiatric: Alert and oriented x 3, mood and behavior normal for situation, pleasant and cooperative with exam   3. Neurologic: Speech and language are normal, face is symmetric, moves all 4 extremities voluntarily, at baseline without acute deficits on limited exam   4. HEENMT:  Head is atraumatic, normocephalic, pupils reactive to light, neck is supple, trachea is midline, mucous membranes are moist   5. Respiratory : Lungs are clear to auscultation bilaterally without wheezing, rhonchi, rales, no cyanosis, no increase in work of breathing or accessory muscle use   6. Cardiovascular : Heart rate normal, rhythm is regular, no murmurs, rubs or gallops, positive for peripheral edema, peripheral pulses palpated   7. Gastrointestinal:  Abdomen is soft, nondistended, nontender to palpation bowel sounds active, no masses or organomegaly palpated   8. Skin:  Skin is warm, dry and intact without rashes, acute lesions, or ulcers on limited exam   9.Musculoskeletal:  No acute deformities or trauma, no asymmetry in tone, positive for peripheral edema, peripheral pulses palpated, no tenderness to palpation in the extremities  Data Reviewed: In the ED Temp 98, heart rate 67, respiratory 18, blood pressure 102/66, with O2 sat of 91% No leukocytosis with a white blood cell count of 11.1, hemoglobin 13.9 Hypokalemia at 3.1 Albumin 2.3 AST slightly elevated at 50 Troponin 6, 4 CT head shows no acute findings Chest x-ray shows no acute findings Patient is given 1 L  of LR, Zofran, K+ 40, 500 mL bolus, Admission requested for syncope and collapse  Assessment and Plan: * Syncope - CT head-no acute abnormality - EKG shows a heart rate of 66, normal sinus rhythm, QTc 463 - Orthostatics were slightly positive, but patient was not standing when this happened - 500 mL bolus given in the ED - Check echo in the a.m. - Check carotid ultrasound in the a.m. - Troponin normal at 6, then 4 - Monitor on telemetry - Continue to monitor  Hypoalbuminemia - Albumin 2.3 - Encourage nutrient dense food options when possible  AKI (acute kidney injury) - Creatinine 0.9>> 1.2 - Likely related to poor p.o. intake while on diuretics - Hold diuretics - Avoid nephrotoxic agents when possible - Trend in the a.m.  Hypokalemia - Potassium 3.1 - Replace and recheck  GERD (gastroesophageal reflux disease) - Continue PPI  Essential hypertension - Continue Lopressor  Hyperlipidemia - Continue statin  Acquired hypothyroidism - Continue Synthroid      Advance Care Planning:   Code Status: Full Code  Consults: None at this time  Family Communication: Husband at bedside  Severity of Illness: The appropriate patient status for this patient is OBSERVATION. Observation status is judged to be reasonable and necessary in order to provide the required intensity of service to ensure the patient's safety. The  patient's presenting symptoms, physical exam findings, and initial radiographic and laboratory data in the context of their medical condition is felt to place them at decreased risk for further clinical deterioration. Furthermore, it is anticipated that the patient will be medically stable for discharge from the hospital within 2 midnights of admission.   Author: Rolla Plate, DO 12/21/2022 4:40 AM  For on call review [email or web address].

## 2022-12-21 NOTE — Assessment & Plan Note (Signed)
Continue PPI ?

## 2022-12-21 NOTE — Progress Notes (Signed)
  Transition of Care (TOC) Screening Note   Patient Details  Name: Sara [name] Date of Birth: 09-Feb-1946   Transition of Care [facility]) CM/SW Contact:    [name], [place] Phone Number: 12/21/2022, 11:21 AM    Transition of Care Department [facility]) has reviewed patient and no TOC needs have been identified at this time. We will continue to monitor patient advancement through interdisciplinary progression rounds. If new patient transition needs arise, please place a TOC consult.

## 2022-12-21 NOTE — Progress Notes (Signed)
  Echocardiogram 2D Echocardiogram has been performed.  Sara [name] 12/21/2022, 11:31 AM

## 2022-12-21 NOTE — Assessment & Plan Note (Signed)
Continue Synthroid °

## 2022-12-21 NOTE — Assessment & Plan Note (Signed)
-   Potassium 3.1 - Replace and recheck

## 2022-12-22 ENCOUNTER — Inpatient Hospital Stay (HOSPITAL_COMMUNITY): Payer: Medicare Other

## 2022-12-22 ENCOUNTER — Inpatient Hospital Stay (INDEPENDENT_AMBULATORY_CARE_PROVIDER_SITE_OTHER): Payer: Medicare Other

## 2022-12-22 ENCOUNTER — Encounter (HOSPITAL_COMMUNITY): Payer: Self-pay | Admitting: Radiology

## 2022-12-22 ENCOUNTER — Telehealth: Payer: Self-pay

## 2022-12-22 DIAGNOSIS — R55 Syncope and collapse: Secondary | ICD-10-CM

## 2022-12-22 LAB — BASIC METABOLIC PANEL
Anion gap: 6 (ref 5–15)
BUN: 15 mg/dL (ref 8–23)
CO2: 26 mmol/L (ref 22–32)
Calcium: 7.7 mg/dL — ABNORMAL LOW (ref 8.9–10.3)
Chloride: 105 mmol/L (ref 98–111)
Creatinine, Ser: 0.89 mg/dL (ref 0.44–1.00)
GFR, Estimated: >60 mL/min (ref >60)
Glucose, Bld: 95 mg/dL (ref 70–99)
Potassium: 3.6 mmol/L (ref 3.5–5.1)
Sodium: 137 mmol/L (ref 135–145)

## 2022-12-22 LAB — CBC
HCT: 37.3 % (ref 36.0–46.0)
Hemoglobin: 12.4 g/dL (ref 12.0–15.0)
MCH: 32.7 pg (ref 26.0–34.0)
MCHC: 33.2 g/dL (ref 30.0–36.0)
MCV: 98.4 fL (ref 80.0–100.0)
Platelets: 211 10*3/uL (ref 150–400)
RBC: 3.79 MIL/uL — ABNORMAL LOW (ref 3.87–5.11)
RDW: 13.5 % (ref 11.5–15.5)
WBC: 5.5 10*3/uL (ref 4.0–10.5)
nRBC: 0 % (ref 0.0–0.2)

## 2022-12-22 MED ORDER — LEVOTHYROXINE SODIUM 150 MCG PO TABS: 150.0000 ug | ORAL_TABLET | Freq: Every day | ORAL | Status: AC

## 2022-12-22 MED ORDER — IOHEXOL 350 MG/ML SOLN
75.0000 mL | Freq: Once | INTRAVENOUS | Status: AC | PRN
  Administered 2022-12-22: 75 mL via INTRAVENOUS

## 2022-12-22 NOTE — Progress Notes (Signed)
OT Cancellation Note  Patient Details Name: Sara [name] MRN: [id number] DOB: March 11, 1946   Cancelled Treatment:    Reason Eval/Treat Not Completed: Other (comment). Discussed with PT. No OT needs at this time. Per PT:  consulted with patient, husband and daughter, pt feels she is at her baseline functioning. Will sign off.     Guadelupe Sabin, OTR/L  [phone] 12/22/2022, 12:00 PM

## 2022-12-22 NOTE — Progress Notes (Signed)
[facility] PROGRESS NOTE   Sara [name] [id number] DOB: 1945/09/24 DOA: 12/20/2022  PCP: [name], MD  Brief History/Interval Summary: 77 y.o. female with medical history significant of anxiety, GERD, hypothyroidism, hypertension, hyperlipidemia, and more presented to the ED with a chief complaint of syncope and collapse.  Patient reports that she had been out to a restaurant for dinner.  She had left the table and got over to the counter to sit on the stool and playing her game.  She was playing game on her phone.  She collapsed.    Consultants: None yet  Procedures: Echocardiogram     Subjective/Interval History: Patient denies any complaints this morning.  No episodes of syncope so far in the hospital.  No shortness of breath.  Again had the burning sensation in her chest overnight.  None currently.  Complains of swelling in her legs.      Assessment/Plan:  Syncope Etiology unclear.  No prodromal symptoms were present.  This raises concern for some kind of arrhythmia. History not suggestive of orthostatic hypotension.  Orthostatic vital signs did not show to drop in blood pressure, actually blood pressure increased, but did show an increase in the heart rate.   Patient did mention that she was started on spironolactone about a month ago.  This could have resulted in some degree of dehydration.  This was held.  Patient was hydrated. Telemetry does not show any arrhythmias.   Echocardiogram shows normal systolic function with grade 1 diastolic dysfunction.  Small pericardial effusion is noted.  No tamponade.  Changes similar to echo done earlier this year.  EKG did not show any ischemic changes. Due to nonspecific chest symptoms CT angiogram was done which did not show any PE.  Trace bilateral pleural effusions were noted.  Pericardial effusion was noted but noted to be decreased in size compared to 2021. Lower extremity Doppler studies pending. If no DVT is  identified then patient could be discharged home later today.   Patient has an appointment with cardiology on April 12.  Will send them a message.  Patient may need event monitor.  Apparently she has had a Zio patch previously but not currently. PT OT eval is pending though patient mentioned that she has been ambulating in the room without difficulty.  Prominent right axillary lymph node measuring 9 mm Incidentally noted on CT angiogram.  Patient with history of breast cancer.  Followed at the [facility].  Had a mammogram done recently in February.  Looks like plan is to repeat mammogram in 1 year.  Will send a message to her oncology team so that they can pursue this further.  Acute kidney injury Baseline renal function is normal.  Came in with creatinine of 1.2.  Given IV fluids with improvement in renal function.  Spironolactone could be responsible for some of her renal insufficiency. Now she has lower extremity edema.  Will discontinue IV fluids.  Follow-up on lower extremity Doppler studies.    Hypotension Metoprolol and spironolactone placed on hold.  She was given IV fluids with improvement in blood pressure.  Continue to hold these medications at discharge.  Lower extremity edema If no DVT is identified will recommend TED stockings and as needed furosemide.  Hypokalemia Repleted.  Magnesium is 2.2.  Hypoalbuminemia Albumin is 1.8 today total protein is 4.8.  Calcium level when corrected for low albumin is normal.  She does not have anemia or thrombocytopenia. Will recommend this be monitored in the  outpatient setting.  She may need further workup as well.  Hyperlipidemia Statin.  Hypothyroidism Continue levothyroxine.  Obesity Estimated body mass index is 35.86 kg/m as calculated from the following:   Height as of 11/20/22: 5\' 6"  (1.676 m).   Weight as of 12/16/22: 100.8 kg.   DVT Prophylaxis: Subcutaneous heparin Code Status: Full code Family Communication:  Discussed with patient. Disposition Plan: Anticipate discharge later today if workup is negative.     Medications: Scheduled:  aspirin EC  81 mg Oral Daily   atorvastatin  20 mg Oral Daily   gabapentin  600 mg Oral BID   heparin  5,000 Units Subcutaneous Q8H   levothyroxine  150 mcg Oral Daily   oxybutynin  5 mg Oral BID   pantoprazole  40 mg Oral Daily   Continuous:  sodium chloride 100 mL/hr at 12/22/22 0408   [id number] **OR** acetaminophen, ondansetron **OR** ondansetron (ZOFRAN) IV, oxyCODONE  Antibiotics: Anti-infectives (From admission, onward)    None       Objective:  Vital Signs  Vitals:   12/21/22 2007 12/21/22 2101 12/22/22 0526 12/22/22 0820  BP: 104/60 (!) 97/55 126/69 (!) 129/92  Pulse: 67 66 61 64  Resp: 18 14    Temp: 98.7 F (37.1 C) 98.5 F (36.9 C) 97.9 F (36.6 C) 97.9 F (36.6 C)  TempSrc:  Oral Oral Oral  SpO2: 98% 98% 97% 98%    Intake/Output Summary (Last 24 hours) at 12/22/2022 0957 Last data filed at 12/21/2022 1700 Gross per 24 hour  Intake 928.39 ml  Output --  Net 928.39 ml    There were no vitals filed for this visit.  General appearance: Awake alert.  In no distress Resp: Clear to auscultation bilaterally.  Normal effort Cardio: S1-S2 is normal regular.  No S3-S4.  No rubs murmurs or bruit GI: Abdomen is soft.  Nontender nondistended.  Bowel sounds are present normal.  No masses organomegaly Extremities: Bilateral lower extremity edema noted.  Full range of motion of lower extremities. Neurologic: Alert and oriented x3.  No focal neurological deficits.     Lab Results:  Data Reviewed: I have personally reviewed following labs and reports of the imaging studies  CBC: Recent Labs  Lab 12/20/22 1949 12/21/22 0412 12/22/22 0439  WBC 11.1* 7.9 5.5  NEUTROABS 8.7* 4.1  --   HGB 13.9 12.1 12.4  HCT 41.4 36.6 37.3  MCV 97.6 99.2 98.4  PLT 234 216 211     Basic Metabolic Panel: Recent Labs  Lab  12/20/22 1949 12/21/22 0412 12/22/22 0439  NA 138 138 137  K 3.1* 4.5 3.6  CL 98 101 105  CO2 32 29 26  GLUCOSE 107* 95 95  BUN 15 17 15   CREATININE 1.20* 1.24* 0.89  CALCIUM 8.1* 7.9* 7.7*  MG  --  2.2  --      GFR: Estimated Creatinine Clearance: 65.4 mL/min (by C-G formula based on SCr of 0.89 mg/dL).  Liver Function Tests: Recent Labs  Lab 12/20/22 1949 12/21/22 0412  AST 50* 35  ALT 32 25  ALKPHOS 81 66  BILITOT 0.6 0.5  PROT 5.5* 4.8*  ALBUMIN 2.3* 1.8*     CBG: Recent Labs  Lab 12/20/22 1842  GLUCAP 165*      Radiology Studies: CT Angio Chest Pulmonary Embolism (PE) W or WO Contrast  Result Date: 12/22/2022 CLINICAL DATA:  Pulmonary embolism (PE) suspected, high prob EXAM: CT ANGIOGRAPHY CHEST WITH CONTRAST TECHNIQUE: Multidetector CT imaging of the chest  was performed using the standard protocol during bolus administration of intravenous contrast. Multiplanar CT image reconstructions and MIPs were obtained to evaluate the vascular anatomy. RADIATION DOSE REDUCTION: This exam was performed according to the departmental dose-optimization program which includes automated exposure control, adjustment of the mA and/or kV according to patient size and/or use of iterative reconstruction technique. CONTRAST:  75mL OMNIPAQUE IOHEXOL 350 MG/ML SOLN COMPARISON:  03/05/2020 FINDINGS: Cardiovascular: Satisfactory opacification of the pulmonary arteries to the segmental level. No evidence of pulmonary embolism. Thoracic aorta is nonaneurysmal. Scattered atherosclerotic vascular calcifications of the aorta and coronary arteries. Heart size is upper limits of normal. Chronic small-moderate pericardial effusion has decreased since 2021. Mediastinum/Nodes: No enlarged mediastinal or hilar lymph nodes. Mildly prominent right axillary lymph node measuring 9 mm short axis (series 4, image 36). Surgical clips in the right axilla. Thyroid gland, trachea, and esophagus demonstrate no  significant findings. Lungs/Pleura: Trace bilateral pleural effusions. No airspace consolidation. No pneumothorax. Upper Abdomen: No acute abnormality. Musculoskeletal: Postsurgical changes from right mastectomy. No acute osseous abnormality. No suspicious lytic or sclerotic bone lesion. Review of the MIP images confirms the above findings. IMPRESSION: 1. No evidence of pulmonary embolism. 2. Trace bilateral pleural effusions. 3. Chronic small-moderate pericardial effusion has decreased since 2021. 4. Mildly prominent right axillary lymph node measuring 9 mm, nonspecific and could be reactive. Mammographic follow-up is recommended given patient's history of right-sided breast cancer. 5. Aortic and coronary artery atherosclerosis (ICD10-I70.0). Electronically Signed   By: Davina Poke D.O.   On: 12/22/2022 09:49   ECHOCARDIOGRAM LIMITED  Result Date: 12/21/2022    ECHOCARDIOGRAM LIMITED REPORT   Patient Name:   Sara [name] Date of Exam: 12/21/2022 Medical Rec #:  [id number]         Height:       66.0 in Accession #:    [id number]        Weight:       222.2 lb Date of Birth:  1945/11/14         BSA:          2.091 m Patient Age:    87 years          BP:           97/51 mmHg Patient Gender: F                 HR:           66 bpm. Exam Location:  [facility] Procedure: Limited Echo, Cardiac Doppler and Limited Color Doppler Indications:    Syncope R55  History:        Patient has prior history of Echocardiogram examinations, most                 recent 11/18/2022. Signs/Symptoms:Syncope; Risk                 Factors:Hypertension, Dyslipidemia, Former Smoker and Sleep                 Apnea.  Sonographer:    Greer Pickerel Referring Phys: [id number] ASIA B [place]  Sonographer Comments: Image acquisition challenging due to respiratory motion and Image acquisition challenging due to patient body habitus. IMPRESSIONS  1. Left ventricular ejection fraction, by estimation, is 65 to 70%. The left ventricle has normal  function. Left ventricular diastolic parameters are consistent with Grade I diastolic dysfunction (impaired relaxation).  2. A small pericardial effusion is present. The pericardial effusion is lateral to the left ventricle, localized  near the right atrium and posterior to the left ventricle. There is no evidence of cardiac tamponade.  3. The inferior vena cava is normal in size with greater than 50% respiratory variability, suggesting right atrial pressure of 3 mmHg. Comparison(s): No significant change from prior study. Stable pericardial effusion. FINDINGS  Left Ventricle: Left ventricular ejection fraction, by estimation, is 65 to 70%. The left ventricle has normal function. The left ventricular internal cavity size was normal in size. There is no left ventricular hypertrophy. Left ventricular diastolic parameters are consistent with Grade I diastolic dysfunction (impaired relaxation). Right Ventricle: The right ventricular size is not well visualized. Right vetricular wall thickness was not well visualized. Right ventricular systolic function was not well visualized. Left Atrium: Left atrial size was normal in size. Right Atrium: Right atrial size was normal in size. Pericardium: A small pericardial effusion is present. The pericardial effusion is lateral to the left ventricle, localized near the right atrium and posterior to the left ventricle. There is no evidence of cardiac tamponade. Mitral Valve: The mitral valve is normal in structure. Aortic Valve: The aortic valve was not well visualized. Aortic valve regurgitation is not visualized. Venous: The inferior vena cava is normal in size with greater than 50% respiratory variability, suggesting right atrial pressure of 3 mmHg. IAS/Shunts: The interatrial septum was not well visualized. Additional Comments: Spectral Doppler performed. Color Doppler performed.  LEFT VENTRICLE PLAX 2D LVIDd:         3.40 cm LVIDs:         2.00 cm LV PW:         0.90 cm LV IVS:         0.80 cm  LEFT ATRIUM         Index LA diam:    2.40 cm 1.15 cm/m  MITRAL VALVE               TRICUSPID VALVE MV Area (PHT): 2.60 cm    TR Peak grad:   23.6 mmHg MV Decel Time: 292 msec    TR Vmax:        243.00 cm/s MV E velocity: 80.40 cm/s MV A velocity: 99.50 cm/s MV E/A ratio:  0.81 Vishnu Priya Mallipeddi Electronically signed by Lorelee Cover Mallipeddi Signature Date/Time: 12/21/2022/1:04:58 PM    Final    US Carotid Bilateral  Result Date: 12/21/2022 CLINICAL DATA:  Syncope Hypertension Hyperlipidemia Former tobacco user EXAM: BILATERAL CAROTID DUPLEX ULTRASOUND TECHNIQUE: Pearline Cables scale imaging, color Doppler and duplex ultrasound were performed of bilateral carotid and vertebral arteries in the neck. COMPARISON:  06/26/2010 FINDINGS: Criteria: Quantification of carotid stenosis is based on velocity parameters that correlate the residual internal carotid diameter with NASCET-based stenosis levels, using the diameter of the distal internal carotid lumen as the denominator for stenosis measurement. The following velocity measurements were obtained: RIGHT ICA: 117/18 cm/sec CCA: [id number] cm/sec SYSTOLIC ICA/CCA RATIO:  1.1 ECA: 91 cm/sec LEFT ICA: 84/22 cm/sec CCA: [id number] cm/sec SYSTOLIC ICA/CCA RATIO:  0.8 ECA: 84 cm/sec RIGHT CAROTID ARTERY: No significant atheromatous plaque. RIGHT VERTEBRAL ARTERY:  Antegrade flow. LEFT CAROTID ARTERY:  No significant atheromatous plaque. LEFT VERTEBRAL ARTERY:  Antegrade flow. IMPRESSION: No significant stenosis of internal carotid arteries. Electronically Signed   By: Miachel Roux M.D.   On: 12/21/2022 11:47   CT Head Wo Contrast  Result Date: 12/20/2022 CLINICAL DATA:  Sudden severe headache.  Unilateral weakness EXAM: CT HEAD WITHOUT CONTRAST TECHNIQUE: Contiguous axial images were obtained from the base of the  skull through the vertex without intravenous contrast. RADIATION DOSE REDUCTION: This exam was performed according to the departmental dose-optimization  program which includes automated exposure control, adjustment of the mA and/or kV according to patient size and/or use of iterative reconstruction technique. COMPARISON:  10/30/2020 FINDINGS: Brain: The brain shows a normal appearance without evidence of malformation, atrophy, old or acute small or large vessel infarction, mass lesion, hemorrhage, hydrocephalus or extra-axial collection. Vascular: There is atherosclerotic calcification of the major vessels at the base of the brain. Skull: Normal.  No traumatic finding.  No focal bone lesion. Sinuses/Orbits: Sinuses are clear. Orbits appear normal. Mastoids are clear. Other: None significant IMPRESSION: No acute finding. Normal appearance of the brain for age. Atherosclerotic calcification of the major vessels at the base of the brain. Electronically Signed   By: Nelson Chimes M.D.   On: 12/20/2022 20:11   DG Chest Portable 1 View  Result Date: 12/20/2022 CLINICAL DATA:  Syncope EXAM: PORTABLE CHEST 1 VIEW COMPARISON:  03/04/2020 FINDINGS: Lungs are clear. No pneumothorax or pleural effusion. Cardiac size within normal limits. Pulmonary vascularity is normal. Osseous structures are age-appropriate. No acute bone abnormality. IMPRESSION: 1. No active disease. Electronically Signed   By: Fidela Salisbury M.D.   On: 12/20/2022 19:49       LOS: 1 day   [facility] Pager on [email or web address]  12/22/2022, 9:57 AM

## 2022-12-22 NOTE — Telephone Encounter (Signed)
-----   Message from [place], PA-C sent at 12/22/2022 10:14 AM EDT ----- Regarding: heart monitor Pt needs 2 week heart monitor for syncope. She has follow-up with PA. thanks

## 2022-12-22 NOTE — Telephone Encounter (Signed)
Order placed per provider.

## 2022-12-22 NOTE — Discharge Summary (Signed)
[facility]  Physician Discharge Summary   Patient ID: Sara [name] MRN: [id number] DOB/AGE: 76/06/1946 76 y.o.  Admit date: 12/20/2022 Discharge date:   12/22/2022   PCP: [name], MD  DISCHARGE DIAGNOSES:    Syncope   Acquired hypothyroidism   Hyperlipidemia   Essential hypertension   GERD (gastroesophageal reflux disease)   Hypokalemia   AKI (acute kidney injury)   Hypoalbuminemia   RECOMMENDATIONS FOR OUTPATIENT FOLLOW UP: Message sent to cardiology to arrange event monitor Message sent to her oncology team regarding the prominent right axillary lymph node Spironolactone and metoprolol placed on hold due to low blood pressures.  Please follow-up in the outpatient setting.    Home Health: None Equipment/Devices: None  CODE STATUS: Full code  DISCHARGE CONDITION: fair  Diet recommendation: As before  INITIAL HISTORY: 77 y.o. female with medical history significant of anxiety, GERD, hypothyroidism, hypertension, hyperlipidemia, and more presented to the ED with a chief complaint of syncope and collapse.  Patient reports that she had been out to a restaurant for dinner.  She had left the table and got over to the counter to sit on the stool and playing her game.  She was playing game on her phone.  She collapsed.     Consultants: None yet   Procedures: Echocardiogram     HOSPITAL COURSE:   Syncope Etiology unclear.  No prodromal symptoms were present.  This raises concern for some kind of arrhythmia. History not suggestive of orthostatic hypotension.  Orthostatic vital signs did not show to drop in blood pressure, actually blood pressure increased, but did show an increase in the heart rate.   Patient did mention that she was started on spironolactone about a month ago.  This could have resulted in some degree of dehydration.  This was held.  Patient was hydrated. Telemetry does not show any arrhythmias.   Echocardiogram shows normal systolic  function with grade 1 diastolic dysfunction.  Small pericardial effusion is noted.  No tamponade.  Changes similar to echo done earlier this year.  EKG did not show any ischemic changes. Due to nonspecific chest symptoms CT angiogram was done which did not show any PE.  Trace bilateral pleural effusions were noted.  Pericardial effusion was noted but noted to be decreased in size compared to 2021. Lower extremity Doppler studies negative for DVT. Patient has an appointment with cardiology on April 12.  Will send them a message.  Patient may need event monitor.  Apparently she has had a Zio patch previously but not currently. Patient has been ambulating without any difficulty.   Updated results provided to patient, husband and daughter.   Prominent right axillary lymph node measuring 9 mm Incidentally noted on CT angiogram.  Patient with history of breast cancer.  Followed at the [facility].  Had a mammogram done recently in February.  Looks like plan is to repeat mammogram in 1 year.  Will send a message to her oncology team so that they can pursue this further.   Acute kidney injury Baseline renal function is normal.  Came in with creatinine of 1.2.  Given IV fluids with improvement in renal function.  Spironolactone could be responsible for some of her renal insufficiency. Renal function improved with IV fluids.   Hypotension Metoprolol and spironolactone placed on hold.  She was given IV fluids with improvement in blood pressure.  Continue to hold these medications at discharge.   Lower extremity edema TED stockings.  Continue with torsemide  that she already takes at home.   Hypokalemia Repleted.  Magnesium is 2.2.   Hypoalbuminemia Albumin is 1.8 total protein is 4.8.  Calcium level when corrected for low albumin is normal.  She does not have anemia or thrombocytopenia. Will recommend this be monitored in the outpatient setting.  She may need further workup as well.    Hyperlipidemia Statin.   Hypothyroidism Continue levothyroxine.   Obesity Estimated body mass index is 35.86 kg/m as calculated from the following:   Height as of 11/20/22: 5\' 6"  (1.676 m).   Weight as of 12/16/22: 100.8 kg.  Patient is stable.  Workup has been unrevealing.  Event monitor to be arranged in the outpatient setting.  Stable for discharge today.   PERTINENT LABS:  The results of significant diagnostics from this hospitalization (including imaging, microbiology, ancillary and laboratory) are listed below for reference.    Labs:   Basic Metabolic Panel: Recent Labs  Lab 12/20/22 1949 12/21/22 0412 12/22/22 0439  NA 138 138 137  K 3.1* 4.5 3.6  CL 98 101 105  CO2 32 29 26  GLUCOSE 107* 95 95  BUN 15 17 15   CREATININE 1.20* 1.24* 0.89  CALCIUM 8.1* 7.9* 7.7*  MG  --  2.2  --    Liver Function Tests: Recent Labs  Lab 12/20/22 1949 12/21/22 0412  AST 50* 35  ALT 32 25  ALKPHOS 81 66  BILITOT 0.6 0.5  PROT 5.5* 4.8*  ALBUMIN 2.3* 1.8*   CBC: Recent Labs  Lab 12/20/22 1949 12/21/22 0412 12/22/22 0439  WBC 11.1* 7.9 5.5  NEUTROABS 8.7* 4.1  --   HGB 13.9 12.1 12.4  HCT 41.4 36.6 37.3  MCV 97.6 99.2 98.4  PLT 234 216 211      CBG: Recent Labs  Lab 12/20/22 1842  GLUCAP 165*     IMAGING STUDIES US Venous Img Lower Bilateral (DVT)  Result Date: 12/22/2022 CLINICAL DATA:  Bilateral lower extremity edema. Former smoker. History of malignancy. Evaluate for DVT. EXAM: BILATERAL LOWER EXTREMITY VENOUS DOPPLER ULTRASOUND TECHNIQUE: Gray-scale sonography with graded compression, as well as color Doppler and duplex ultrasound were performed to evaluate the lower extremity deep venous systems from the level of the common femoral vein and including the common femoral, femoral, profunda femoral, popliteal and calf veins including the posterior tibial, peroneal and gastrocnemius veins when visible. The superficial great saphenous vein was also  interrogated. Spectral Doppler was utilized to evaluate flow at rest and with distal augmentation maneuvers in the common femoral, femoral and popliteal veins. COMPARISON:  Right lower extremity venous Doppler ultrasound-01/30/2014 (negative). FINDINGS: RIGHT LOWER EXTREMITY Common Femoral Vein: No evidence of thrombus. Normal compressibility, respiratory phasicity and response to augmentation. Saphenofemoral Junction: No evidence of thrombus. Normal compressibility and flow on color Doppler imaging. Profunda Femoral Vein: No evidence of thrombus. Normal compressibility and flow on color Doppler imaging. Femoral Vein: No evidence of thrombus. Normal compressibility, respiratory phasicity and response to augmentation. Popliteal Vein: No evidence of thrombus. Normal compressibility, respiratory phasicity and response to augmentation. Calf Veins: No evidence of thrombus. Normal compressibility and flow on color Doppler imaging. Superficial Great Saphenous Vein: No evidence of thrombus. Normal compressibility. Other Findings:  None. LEFT LOWER EXTREMITY Common Femoral Vein: No evidence of thrombus. Normal compressibility, respiratory phasicity and response to augmentation. Saphenofemoral Junction: No evidence of thrombus. Normal compressibility and flow on color Doppler imaging. Profunda Femoral Vein: No evidence of thrombus. Normal compressibility and flow on color Doppler imaging. Femoral Vein:  No evidence of thrombus. Normal compressibility, respiratory phasicity and response to augmentation. Popliteal Vein: No evidence of thrombus. Normal compressibility, respiratory phasicity and response to augmentation. Calf Veins: No evidence of thrombus. Normal compressibility and flow on color Doppler imaging. Superficial Great Saphenous Vein: No evidence of thrombus. Normal compressibility. Other Findings:  None. IMPRESSION: No evidence of DVT within either lower extremity. Electronically Signed   By: Sandi Mariscal M.D.   On:  12/22/2022 10:07   CT Angio Chest Pulmonary Embolism (PE) W or WO Contrast  Result Date: 12/22/2022 CLINICAL DATA:  Pulmonary embolism (PE) suspected, high prob EXAM: CT ANGIOGRAPHY CHEST WITH CONTRAST TECHNIQUE: Multidetector CT imaging of the chest was performed using the standard protocol during bolus administration of intravenous contrast. Multiplanar CT image reconstructions and MIPs were obtained to evaluate the vascular anatomy. RADIATION DOSE REDUCTION: This exam was performed according to the departmental dose-optimization program which includes automated exposure control, adjustment of the mA and/or kV according to patient size and/or use of iterative reconstruction technique. CONTRAST:  52mL OMNIPAQUE IOHEXOL 350 MG/ML SOLN COMPARISON:  03/05/2020 FINDINGS: Cardiovascular: Satisfactory opacification of the pulmonary arteries to the segmental level. No evidence of pulmonary embolism. Thoracic aorta is nonaneurysmal. Scattered atherosclerotic vascular calcifications of the aorta and coronary arteries. Heart size is upper limits of normal. Chronic small-moderate pericardial effusion has decreased since 2021. Mediastinum/Nodes: No enlarged mediastinal or hilar lymph nodes. Mildly prominent right axillary lymph node measuring 9 mm short axis (series 4, image 36). Surgical clips in the right axilla. Thyroid gland, trachea, and esophagus demonstrate no significant findings. Lungs/Pleura: Trace bilateral pleural effusions. No airspace consolidation. No pneumothorax. Upper Abdomen: No acute abnormality. Musculoskeletal: Postsurgical changes from right mastectomy. No acute osseous abnormality. No suspicious lytic or sclerotic bone lesion. Review of the MIP images confirms the above findings. IMPRESSION: 1. No evidence of pulmonary embolism. 2. Trace bilateral pleural effusions. 3. Chronic small-moderate pericardial effusion has decreased since 2021. 4. Mildly prominent right axillary lymph node measuring 9 mm,  nonspecific and could be reactive. Mammographic follow-up is recommended given patient's history of right-sided breast cancer. 5. Aortic and coronary artery atherosclerosis (ICD10-I70.0). Electronically Signed   By: Davina Poke D.O.   On: 12/22/2022 09:49   ECHOCARDIOGRAM LIMITED  Result Date: 12/21/2022    ECHOCARDIOGRAM LIMITED REPORT   Patient Name:   Sara [name] Date of Exam: 12/21/2022 Medical Rec #:  [id number]         Height:       66.0 in Accession #:    [id number]        Weight:       222.2 lb Date of Birth:  16-Apr-1946         BSA:          2.091 m Patient Age:    4 years          BP:           97/51 mmHg Patient Gender: F                 HR:           66 bpm. Exam Location:  [facility] Procedure: Limited Echo, Cardiac Doppler and Limited Color Doppler Indications:    Syncope R55  History:        Patient has prior history of Echocardiogram examinations, most                 recent 11/18/2022. Signs/Symptoms:Syncope; Risk  Factors:Hypertension, Dyslipidemia, Former Smoker and Sleep                 Apnea.  Sonographer:    Greer Pickerel Referring Phys: [id number] ASIA B [place]  Sonographer Comments: Image acquisition challenging due to respiratory motion and Image acquisition challenging due to patient body habitus. IMPRESSIONS  1. Left ventricular ejection fraction, by estimation, is 65 to 70%. The left ventricle has normal function. Left ventricular diastolic parameters are consistent with Grade I diastolic dysfunction (impaired relaxation).  2. A small pericardial effusion is present. The pericardial effusion is lateral to the left ventricle, localized near the right atrium and posterior to the left ventricle. There is no evidence of cardiac tamponade.  3. The inferior vena cava is normal in size with greater than 50% respiratory variability, suggesting right atrial pressure of 3 mmHg. Comparison(s): No significant change from prior study. Stable pericardial effusion. FINDINGS   Left Ventricle: Left ventricular ejection fraction, by estimation, is 65 to 70%. The left ventricle has normal function. The left ventricular internal cavity size was normal in size. There is no left ventricular hypertrophy. Left ventricular diastolic parameters are consistent with Grade I diastolic dysfunction (impaired relaxation). Right Ventricle: The right ventricular size is not well visualized. Right vetricular wall thickness was not well visualized. Right ventricular systolic function was not well visualized. Left Atrium: Left atrial size was normal in size. Right Atrium: Right atrial size was normal in size. Pericardium: A small pericardial effusion is present. The pericardial effusion is lateral to the left ventricle, localized near the right atrium and posterior to the left ventricle. There is no evidence of cardiac tamponade. Mitral Valve: The mitral valve is normal in structure. Aortic Valve: The aortic valve was not well visualized. Aortic valve regurgitation is not visualized. Venous: The inferior vena cava is normal in size with greater than 50% respiratory variability, suggesting right atrial pressure of 3 mmHg. IAS/Shunts: The interatrial septum was not well visualized. Additional Comments: Spectral Doppler performed. Color Doppler performed.  LEFT VENTRICLE PLAX 2D LVIDd:         3.40 cm LVIDs:         2.00 cm LV PW:         0.90 cm LV IVS:        0.80 cm  LEFT ATRIUM         Index LA diam:    2.40 cm 1.15 cm/m  MITRAL VALVE               TRICUSPID VALVE MV Area (PHT): 2.60 cm    TR Peak grad:   23.6 mmHg MV Decel Time: 292 msec    TR Vmax:        243.00 cm/s MV E velocity: 80.40 cm/s MV A velocity: 99.50 cm/s MV E/A ratio:  0.81 Vishnu Priya Mallipeddi Electronically signed by Lorelee Cover Mallipeddi Signature Date/Time: 12/21/2022/1:04:58 PM    Final    US Carotid Bilateral  Result Date: 12/21/2022 CLINICAL DATA:  Syncope Hypertension Hyperlipidemia Former tobacco user EXAM: BILATERAL CAROTID  DUPLEX ULTRASOUND TECHNIQUE: Pearline Cables scale imaging, color Doppler and duplex ultrasound were performed of bilateral carotid and vertebral arteries in the neck. COMPARISON:  06/26/2010 FINDINGS: Criteria: Quantification of carotid stenosis is based on velocity parameters that correlate the residual internal carotid diameter with NASCET-based stenosis levels, using the diameter of the distal internal carotid lumen as the denominator for stenosis measurement. The following velocity measurements were obtained: RIGHT ICA: 117/18 cm/sec CCA: [id number] cm/sec SYSTOLIC  ICA/CCA RATIO:  1.1 ECA: 91 cm/sec LEFT ICA: 84/22 cm/sec CCA: [id number] cm/sec SYSTOLIC ICA/CCA RATIO:  0.8 ECA: 84 cm/sec RIGHT CAROTID ARTERY: No significant atheromatous plaque. RIGHT VERTEBRAL ARTERY:  Antegrade flow. LEFT CAROTID ARTERY:  No significant atheromatous plaque. LEFT VERTEBRAL ARTERY:  Antegrade flow. IMPRESSION: No significant stenosis of internal carotid arteries. Electronically Signed   By: Miachel Roux M.D.   On: 12/21/2022 11:47   CT Head Wo Contrast  Result Date: 12/20/2022 CLINICAL DATA:  Sudden severe headache.  Unilateral weakness EXAM: CT HEAD WITHOUT CONTRAST TECHNIQUE: Contiguous axial images were obtained from the base of the skull through the vertex without intravenous contrast. RADIATION DOSE REDUCTION: This exam was performed according to the departmental dose-optimization program which includes automated exposure control, adjustment of the mA and/or kV according to patient size and/or use of iterative reconstruction technique. COMPARISON:  10/30/2020 FINDINGS: Brain: The brain shows a normal appearance without evidence of malformation, atrophy, old or acute small or large vessel infarction, mass lesion, hemorrhage, hydrocephalus or extra-axial collection. Vascular: There is atherosclerotic calcification of the major vessels at the base of the brain. Skull: Normal.  No traumatic finding.  No focal bone lesion. Sinuses/Orbits:  Sinuses are clear. Orbits appear normal. Mastoids are clear. Other: None significant IMPRESSION: No acute finding. Normal appearance of the brain for age. Atherosclerotic calcification of the major vessels at the base of the brain. Electronically Signed   By: Nelson Chimes M.D.   On: 12/20/2022 20:11   DG Chest Portable 1 View  Result Date: 12/20/2022 CLINICAL DATA:  Syncope EXAM: PORTABLE CHEST 1 VIEW COMPARISON:  03/04/2020 FINDINGS: Lungs are clear. No pneumothorax or pleural effusion. Cardiac size within normal limits. Pulmonary vascularity is normal. Osseous structures are age-appropriate. No acute bone abnormality. IMPRESSION: 1. No active disease. Electronically Signed   By: Fidela Salisbury M.D.   On: 12/20/2022 19:49   DISCHARGE EXAMINATION: Note from earlier today  DISPOSITION: Home  Discharge Instructions     Call MD for:  difficulty breathing, headache or visual disturbances   Complete by: As directed    Call MD for:  extreme fatigue   Complete by: As directed    Call MD for:  persistant dizziness or light-headedness   Complete by: As directed    Call MD for:  persistant nausea and vomiting   Complete by: As directed    Call MD for:  severe uncontrolled pain   Complete by: As directed    Call MD for:  temperature >100.4   Complete by: As directed    Diet - low sodium heart healthy   Complete by: As directed    Discharge instructions   Complete by: As directed    Please stop taking your metoprolol.  You may continue your torsemide as before but stopped taking the spironolactone.  Please follow-up with your primary care provider within week for blood work to make sure your kidneys are okay.  Message has been sent to cardiology to arrange event monitor.  Message has also been sent to oncology due to a prominent lymph node that was seen on the CT scan.  They will call you if they want to do further testing.  You were cared for by a hospitalist during your hospital stay. If you  have any questions about your discharge medications or the care you received while you were in the hospital after you are discharged, you can call the unit and asked to speak with the hospitalist on call if  the hospitalist that took care of you is not available. Once you are discharged, your primary care physician will handle any further medical issues. Please note that NO REFILLS for any discharge medications will be authorized once you are discharged, as it is imperative that you return to your primary care physician (or establish a relationship with a primary care physician if you do not have one) for your aftercare needs so that they can reassess your need for medications and monitor your lab values. If you do not have a primary care physician, you can call [phone] for a physician referral.   Increase activity slowly   Complete by: As directed           Allergies as of 12/22/2022       Reactions   Propoxyphene Hcl Other (See Comments)    Hallucinations   Pollen Extract Other (See Comments)   Sneezing, congestion   Camphor Itching   Dust Mite Extract Itching   Sneezing. Runny nose   Latex Dermatitis, Rash   Molds & Smuts Itching   Tomato Hives        Medication List     STOP taking these medications    metoprolol tartrate 25 MG tablet Commonly known as: LOPRESSOR   spironolactone 25 MG tablet Commonly known as: Aldactone       TAKE these medications    acetaminophen 500 MG tablet Commonly known as: TYLENOL Take 650 mg by mouth in the morning and at bedtime.   aspirin EC 81 MG tablet Take 81 mg by mouth daily. Swallow whole.   atorvastatin 20 MG tablet Commonly known as: Lipitor Take 1 tablet (20 mg total) by mouth daily.   CENTRUM SILVER PO Take 1 tablet by mouth daily.   diphenhydrAMINE 50 MG capsule Commonly known as: BENADRYL Take 50 mg by mouth at bedtime.   gabapentin 600 MG tablet Commonly known as: NEURONTIN TAKE 1 TABLET BY MOUTH TWICE  DAILY    levothyroxine 150 MCG tablet Commonly known as: SYNTHROID Take 1 tablet (150 mcg total) by mouth daily. Start taking on: December 23, 2022 What changed:  medication strength how much to take   omeprazole 20 MG capsule Commonly known as: PRILOSEC Take 20 mg by mouth daily. May take a second 20 mg dose as needed for heartburn   oxybutynin 5 MG tablet Commonly known as: DITROPAN Take 5 mg by mouth 2 (two) times daily.   torsemide 20 MG tablet Commonly known as: DEMADEX Take 2.5 tablets (50 mg total) by mouth daily. 1 and 1/2 before coffee, 1 tablet after coffee   Vitamin D (Ergocalciferol) 1.25 MG (50000 UNIT) Caps capsule Commonly known as: DRISDOL TAKE 1 CAPSULE BY MOUTH WEEKLY          Follow-up Information     [name], MD. Schedule an appointment as soon as possible for a visit in 1 week(s).   Specialty: Family Medicine Why: post hospitalization follow up Contact information: [address]  [place] [address] [phone]                 TOTAL DISCHARGE TIME: 36 minutes  [place]  [facility] Pager on [email or web address]  12/22/2022, 11:36 AM

## 2022-12-22 NOTE — Progress Notes (Signed)
PT Cancellation Note  Patient Details Name: Sara [name] MRN: [id number] DOB: [birth year]   Cancelled Treatment:    Reason Eval/Treat Not Completed: PT screened, no needs identified, will sign off. PT consulted with patient, husband and daughter. No PT-related conerns at this time. Patient ambulating to bathroom independently with daughter watching her to make sure she is steady. Patient reports no falls in the last six months and feels she is at her prior level of function.   Floria Raveling. Hartnett-Rands, MS, PT Per [place] [phone]  Pamala Hurry  Hartnett-Rands 12/22/2022, 11:50 AM

## 2022-12-24 ENCOUNTER — Telehealth: Payer: Self-pay

## 2022-12-24 ENCOUNTER — Other Ambulatory Visit: Payer: Self-pay | Admitting: Physician Assistant

## 2022-12-24 DIAGNOSIS — R590 Localized enlarged lymph nodes: Secondary | ICD-10-CM

## 2022-12-24 DIAGNOSIS — Z9011 Acquired absence of right breast and nipple: Secondary | ICD-10-CM

## 2022-12-24 DIAGNOSIS — R55 Syncope and collapse: Secondary | ICD-10-CM

## 2022-12-24 DIAGNOSIS — D0511 Intraductal carcinoma in situ of right breast: Secondary | ICD-10-CM

## 2022-12-24 NOTE — Progress Notes (Signed)
Notified by hospitalist (Dr. Maryland Pink).  Patient recently underwent CT angiogram of chest which incidentally showed prominent right axillary lymph node.  Given patient's history of right breast DCIS/LCIS in 2016, s/p right mastectomy, we will obtain diagnostic right breast/right axillary [place].  Harriett Rush, PA-C  12/24/22 11:30 AM

## 2022-12-24 NOTE — Telephone Encounter (Signed)
Message left on patient's voicemail to call the [facility] for instructions regarding CT scan.

## 2022-12-24 NOTE — Telephone Encounter (Signed)
-----   Message from Harriett Rush, [place] sent at 12/24/2022 11:30 AM EDT ----- Horris Latino: Please call patient to let her know that CT scan from her recent hospital stay showed an enlarged lymph node in her right armpit.  This may simply be reactive from inflammation, but since she has a history of right-sided breast cancer, I would like to check an ultrasound.  Please coordinate scheduling with Amy and Danielle.  (Order has been entered.)

## 2022-12-25 ENCOUNTER — Telehealth: Payer: Self-pay | Admitting: Physician Assistant

## 2022-12-25 DIAGNOSIS — M5416 Radiculopathy, lumbar region: Secondary | ICD-10-CM | POA: Diagnosis not present

## 2022-12-25 NOTE — Telephone Encounter (Signed)
Discussed incidental right axillary lymphadenopathy as discovered during recent hospitalization.  Patient is aware of findings and is planning on right breast/axillary ultrasound on 01/07/2023.  Carnella Guadalajara, PA-C 12/25/22 12:44 PM

## 2022-12-29 ENCOUNTER — Encounter: Payer: Self-pay | Admitting: *Deleted

## 2022-12-29 DIAGNOSIS — I10 Essential (primary) hypertension: Secondary | ICD-10-CM | POA: Diagnosis not present

## 2022-12-29 DIAGNOSIS — G4733 Obstructive sleep apnea (adult) (pediatric): Secondary | ICD-10-CM | POA: Diagnosis not present

## 2022-12-29 DIAGNOSIS — R945 Abnormal results of liver function studies: Secondary | ICD-10-CM | POA: Diagnosis not present

## 2022-12-29 DIAGNOSIS — R55 Syncope and collapse: Secondary | ICD-10-CM | POA: Diagnosis not present

## 2022-12-29 DIAGNOSIS — R600 Localized edema: Secondary | ICD-10-CM | POA: Diagnosis not present

## 2022-12-29 DIAGNOSIS — E039 Hypothyroidism, unspecified: Secondary | ICD-10-CM | POA: Diagnosis not present

## 2022-12-29 NOTE — Progress Notes (Signed)
Sara [name] has decided to stop PREP at this time for health issues. She would like to start PREP again this summer. I will call her for the next class beginning in July 2024.

## 2022-12-30 DIAGNOSIS — Z5189 Encounter for other specified aftercare: Secondary | ICD-10-CM | POA: Diagnosis not present

## 2023-01-01 ENCOUNTER — Encounter: Payer: Self-pay | Admitting: Student

## 2023-01-01 ENCOUNTER — Ambulatory Visit: Payer: Medicare Other | Attending: Family | Admitting: Student

## 2023-01-01 VITALS — BP 116/82 | HR 84 | Ht 66.0 in | Wt 223.0 lb

## 2023-01-01 DIAGNOSIS — I2584 Coronary atherosclerosis due to calcified coronary lesion: Secondary | ICD-10-CM

## 2023-01-01 DIAGNOSIS — E785 Hyperlipidemia, unspecified: Secondary | ICD-10-CM

## 2023-01-01 DIAGNOSIS — Z79899 Other long term (current) drug therapy: Secondary | ICD-10-CM

## 2023-01-01 DIAGNOSIS — I2510 Atherosclerotic heart disease of native coronary artery without angina pectoris: Secondary | ICD-10-CM | POA: Diagnosis not present

## 2023-01-01 DIAGNOSIS — R599 Enlarged lymph nodes, unspecified: Secondary | ICD-10-CM

## 2023-01-01 DIAGNOSIS — R600 Localized edema: Secondary | ICD-10-CM | POA: Diagnosis not present

## 2023-01-01 DIAGNOSIS — G4733 Obstructive sleep apnea (adult) (pediatric): Secondary | ICD-10-CM

## 2023-01-01 DIAGNOSIS — R55 Syncope and collapse: Secondary | ICD-10-CM

## 2023-01-01 DIAGNOSIS — I471 Supraventricular tachycardia, unspecified: Secondary | ICD-10-CM

## 2023-01-01 MED ORDER — POTASSIUM CHLORIDE ER 10 MEQ PO TBCR: 10.0000 meq | EXTENDED_RELEASE_TABLET | Freq: Every day | ORAL | 3 refills | Status: DC

## 2023-01-01 MED ORDER — TORSEMIDE 20 MG PO TABS: 60.0000 mg | ORAL_TABLET | Freq: Every day | ORAL | 3 refills | Status: DC

## 2023-01-01 NOTE — Patient Instructions (Signed)
Medication Instructions:   Increase Torsemide to 60 mg Daily   *If you need a refill on your cardiac medications before your next appointment, please call your pharmacy*   Lab Work: Your physician recommends that you return for lab work in: 2 Weeks (BMET, Magnesium)    If you have labs (blood work) drawn today and your tests are completely normal, you will receive your results only by: MyChart Message (if you have MyChart) OR A paper copy in the mail If you have any lab test that is abnormal or we need to change your treatment, we will call you to review the results.   Testing/Procedures: NONE    Follow-Up: At [facility], you and your health needs are our priority.  As part of our continuing mission to provide you with exceptional heart care, we have created designated Provider Care Teams.  These Care Teams include your primary Cardiologist (physician) and Advanced Practice Providers (APPs -  Physician Assistants and Nurse Practitioners) who all work together to provide you with the care you need, when you need it.  We recommend signing up for the patient portal called "MyChart".  Sign up information is provided on this After Visit Summary.  MyChart is used to connect with patients for Virtual Visits (Telemedicine).  Patients are able to view lab/test results, encounter notes, upcoming appointments, etc.  Non-urgent messages can be sent to your provider as well.   To learn more about what you can do with MyChart, go to [email or web address].    Your next appointment:   3 month(s)  Provider:   You may see Nicki Guadalajara, MD or one of the following Advanced Practice Providers on your designated Care Team:   Randall An, PA-C  Jacolyn Reedy, PA-C     Other Instructions Thank you for choosing [facility]!

## 2023-01-01 NOTE — Progress Notes (Signed)
Cardiology Office Note    Date:  01/01/2023  ID:  Sara [name], DOB 02-Oct-1945, MRN [id number] Cardiologist: Nicki Guadalajara, MD    History of Present Illness:    Sara [name] is a 77 y.o. female with past medical history of HTN, HLD, SVT, history of OSA (on CPAP) and CAD (s/p Coronary CT in 02/2020 showing 50 to 74% stenosis along mid RCA but perhaps overestimated with normal FFR and minimal plaque elsewhere) who presents to the office today for 6-week follow-up.  She was last examined by Gillian Shields, NP in 10/2022 and reported worsening dyspnea on exertion and lower extremity edema for the past few days. She was continued on Torsemide 40 mg daily and follow-up labs and echocardiogram were recommended for further evaluation. Her BNP was normal at 33 but given evidence of volume overload by examination, she was started on Spironolactone 12.5 mg daily while being continued on Torsemide. Outpatient echocardiogram was reassuring with a preserved EF of 70 to 75% and no wall motion abnormalities or significant valve abnormalities.  In the interim, she was admitted to [facility] from 3/31 - 12/22/2022 for evaluation of syncope. Orthostatics were negative and telemetry did not show any significant arrhythmias. A limited echocardiogram was obtained and showed a preserved EF of 65 to 70% with no wall motion abnormalities. She did have grade 1 diastolic dysfunction and a small pericardial effusion but no evidence of tamponade. Carotid Dopplers showed no significant stenosis. An outpatient monitor was arranged but has not yet resulted (still in place). She did have an AKI with creatinine at 1.2 on admission and this had improved to 0.89 at the time of discharge. Spironolactone was discontinued. Metoprolol was also discontinued secondary to hypotension. During her workup, she was noted to have a prominent right axillary lymph node measuring 9 mm with Oncology follow-up recommended as an outpatient.  In  talking with the patient and her husband today, she reports still having dyspnea on exertion which has overall been unchanged. No associated chest pain or palpitations. Says she had actually self-discontinued Lopressor prior to her hospitalization as she thought this was for HTN. She denies any specific orthopnea or PND. Uses her CPAP at night. Still has lower extremity edema. She has been trying to elevate her lower extremities and limit her sodium intake.   Studies Reviewed:   EKG: EKG is not ordered today.   Event Monitor: 02/2022  2 episodes of NSVT, longest lasting 14 beats 56 episodes of SVT, longest lasting 57 seconds with average rate 127 bpm     Patch Wear Time:  13 days and 8 hours (2023-05-06T21:38:56-0400 to 2023-05-20T06:34:07-399)   Patient had a min HR of 50 bpm, max HR of 190 bpm, and avg HR of 68 bpm. Predominant underlying rhythm was Sinus Rhythm. 2 Ventricular Tachycardia runs occurred, the run with the fastest interval lasting 9 beats with a max rate of 190 bpm, the longest  lasting 14 beats with an avg rate of 135 bpm. 56 Supraventricular Tachycardia runs occurred, the run with the fastest interval lasting 17 beats with a max rate of 158 bpm, the longest lasting 57.3 secs with an avg rate of 127 bpm. True duration of  Supraventricular Tachycardia difficult to ascertain due to artifact. Supraventricular Tachycardia was detected within +/- 45 seconds of symptomatic patient event(s). Isolated SVEs were rare (<1.0%), SVE Couplets were rare (<1.0%), and SVE Triplets were  rare (<1.0%). Isolated VEs were rare (<1.0%), VE Couplets were rare (<1.0%), and no  VE Triplets were present.  36 patient triggered events, corresponding to sinus rhythm  PACs/PVCs, SVT  Limited Echocardiogram: 12/2022 IMPRESSIONS     1. Left ventricular ejection fraction, by estimation, is 65 to 70%. The  left ventricle has normal function. Left ventricular diastolic parameters  are consistent with Grade I  diastolic dysfunction (impaired relaxation).   2. A small pericardial effusion is present. The pericardial effusion is  lateral to the left ventricle, localized near the right atrium and  posterior to the left ventricle. There is no evidence of cardiac  tamponade.   3. The inferior vena cava is normal in size with greater than 50%  respiratory variability, suggesting right atrial pressure of 3 mmHg.   Comparison(s): No significant change from prior study. Stable pericardial  effusion.   Carotid Dopplers: 12/2022  IMPRESSION: No significant stenosis of internal carotid arteries.   Physical Exam:   VS:  BP 116/82   Pulse 84   Ht 5\' 6"  (1.676 m)   Wt 223 lb (101.2 kg)   SpO2 94%   BMI 35.99 kg/m    Wt Readings from Last 3 Encounters:  01/01/23 223 lb (101.2 kg)  12/16/22 222 lb 3.2 oz (100.8 kg)  12/08/22 223 lb (101.2 kg)     GEN: Well nourished, well developed female appearing in no acute distress NECK: No JVD; No carotid bruits CARDIAC: RRR, no murmurs, rubs, gallops RESPIRATORY:  Clear to auscultation without rales, wheezing or rhonchi  ABDOMEN: Appears non-distended. No obvious abdominal masses. EXTREMITIES: No clubbing or cyanosis. 1+ pitting edema bilaterally.  Distal pedal pulses are 2+ bilaterally.   Assessment and Plan:   1. Syncope - Possibly due to dehydration as she did have an AKI on admission as well. CTA showed no evidence of a PE and repeat echo was reassuring. CT Head also showed no acute intracranial abnormalities. Carotid dopplers also showed no significant stenosis.  - She denies any recurrent syncope but does have presyncopal episodes without prodromal symptoms. She is currently wearing a Zio monitor and we will follow-up on the results once available.   2. Lower Extremity Edema - Still with pitting edema on examination today. Possibly due to venous insufficiency. We reviewed fluid and sodium restrictions and the importance of elevating her lower  extremities. Will titrate Torsemide from 50mg  daily to 60mg  daily (reviewed she could take 60mg  in AM or 40mg  in AM/20mg  at lunch). Repeat BMET and Mg in 2 weeks.   3. SVT/Palpitations - Prior monitor in 02/2022 showed 56 Supraventricular Tachycardia runs with the longest lasting 57.3 secs with an avg rate of 127 bpm. She is not currently taking Lopressor. She does have a Zio monitor in place and based off results, may need to consider restarting this.   4. OSA - Continued compliance with CPAP encouraged.    5. HLD - Followed by her PCP. She remains on Atorvastatin 20mg  daily.   6. Coronary Calcification by CT - Prior Coronary CT in 02/2020 showed 50 to 74% stenosis along mid RCA but perhaps overestimated with normal FFR and minimal plaque elsewhere. She has baseline dyspnea on exertion but no chest pain. If dyspnea does not improve with further diuresis, could consider a repeat Coronary CT.   7. Enlarged Lymph Node - Recent CT showed a prominent right axillary lymph node measuring 9 mm. She does have follow-up imaging scheduled with Oncology.    Signed, Ellsworth Lennox, PA-C

## 2023-01-07 ENCOUNTER — Other Ambulatory Visit: Payer: Self-pay | Admitting: Physician Assistant

## 2023-01-07 ENCOUNTER — Ambulatory Visit (HOSPITAL_COMMUNITY)
Admission: RE | Admit: 2023-01-07 | Discharge: 2023-01-07 | Disposition: A | Discharge status: Home / Self Care | Payer: Medicare Other | Source: Ambulatory Visit | Attending: Physician Assistant | Admitting: Physician Assistant

## 2023-01-07 DIAGNOSIS — Z9011 Acquired absence of right breast and nipple: Secondary | ICD-10-CM | POA: Diagnosis not present

## 2023-01-07 DIAGNOSIS — D0511 Intraductal carcinoma in situ of right breast: Secondary | ICD-10-CM | POA: Diagnosis not present

## 2023-01-07 DIAGNOSIS — N6489 Other specified disorders of breast: Secondary | ICD-10-CM | POA: Diagnosis not present

## 2023-01-07 DIAGNOSIS — R590 Localized enlarged lymph nodes: Secondary | ICD-10-CM | POA: Diagnosis not present

## 2023-01-07 NOTE — Progress Notes (Signed)
Noncontrast CT chest as recommended in 6 months to ensure stability of right axillary lymph nodes.

## 2023-01-08 DIAGNOSIS — G4733 Obstructive sleep apnea (adult) (pediatric): Secondary | ICD-10-CM | POA: Diagnosis not present

## 2023-01-11 ENCOUNTER — Telehealth: Payer: Self-pay

## 2023-01-11 NOTE — Telephone Encounter (Signed)
Spoke with the patient and is aware of CT to be scheduled in 6 months.

## 2023-01-11 NOTE — Telephone Encounter (Signed)
-----   Message from Carnella Guadalajara, [place] sent at 01/07/2023  2:58 PM EDT ----- Please call patient to let her know that I have reviewed the results of the ultrasound.  These were already discussed with her by the radiologist, but please reassure her that findings were normal and did not show any sign of recurrent cancer.  I will order follow-up CT chest to be done in 6 months if you can coordinate scheduling and inform patient.  Thanks!

## 2023-01-14 ENCOUNTER — Emergency Department (HOSPITAL_COMMUNITY)
Admission: EM | Admit: 2023-01-14 | Discharge: 2023-01-15 | Disposition: A | Discharge status: Home / Self Care | Payer: Medicare Other | Attending: Emergency Medicine | Admitting: Emergency Medicine

## 2023-01-14 DIAGNOSIS — Z7982 Long term (current) use of aspirin: Secondary | ICD-10-CM | POA: Diagnosis not present

## 2023-01-14 DIAGNOSIS — R110 Nausea: Secondary | ICD-10-CM | POA: Diagnosis not present

## 2023-01-14 DIAGNOSIS — E876 Hypokalemia: Secondary | ICD-10-CM | POA: Insufficient documentation

## 2023-01-14 DIAGNOSIS — R55 Syncope and collapse: Secondary | ICD-10-CM | POA: Diagnosis not present

## 2023-01-14 DIAGNOSIS — Z91040 Latex allergy status: Secondary | ICD-10-CM | POA: Diagnosis not present

## 2023-01-14 DIAGNOSIS — I10 Essential (primary) hypertension: Secondary | ICD-10-CM | POA: Insufficient documentation

## 2023-01-14 DIAGNOSIS — R0902 Hypoxemia: Secondary | ICD-10-CM | POA: Diagnosis not present

## 2023-01-14 DIAGNOSIS — Z85528 Personal history of other malignant neoplasm of kidney: Secondary | ICD-10-CM | POA: Diagnosis not present

## 2023-01-14 DIAGNOSIS — I499 Cardiac arrhythmia, unspecified: Secondary | ICD-10-CM | POA: Diagnosis not present

## 2023-01-14 DIAGNOSIS — Z743 Need for continuous supervision: Secondary | ICD-10-CM | POA: Diagnosis not present

## 2023-01-14 DIAGNOSIS — R404 Transient alteration of awareness: Secondary | ICD-10-CM | POA: Diagnosis not present

## 2023-01-14 DIAGNOSIS — Z853 Personal history of malignant neoplasm of breast: Secondary | ICD-10-CM | POA: Insufficient documentation

## 2023-01-14 LAB — CBC
HCT: 42.3 % (ref 36.0–46.0)
Hemoglobin: 14.1 g/dL (ref 12.0–15.0)
MCH: 32.9 pg (ref 26.0–34.0)
MCHC: 33.3 g/dL (ref 30.0–36.0)
MCV: 98.6 fL (ref 80.0–100.0)
Platelets: 241 10*3/uL (ref 150–400)
RBC: 4.29 MIL/uL (ref 3.87–5.11)
RDW: 13 % (ref 11.5–15.5)
WBC: 8.6 10*3/uL (ref 4.0–10.5)
nRBC: 0 % (ref 0.0–0.2)

## 2023-01-14 LAB — BASIC METABOLIC PANEL
Anion gap: 10 (ref 5–15)
BUN: 12 mg/dL (ref 8–23)
CO2: 32 mmol/L (ref 22–32)
Calcium: 8.3 mg/dL — ABNORMAL LOW (ref 8.9–10.3)
Chloride: 97 mmol/L — ABNORMAL LOW (ref 98–111)
Creatinine, Ser: 1.17 mg/dL — ABNORMAL HIGH (ref 0.44–1.00)
GFR, Estimated: 49 mL/min — ABNORMAL LOW (ref >60)
Glucose, Bld: 102 mg/dL — ABNORMAL HIGH (ref 70–99)
Potassium: 3.2 mmol/L — ABNORMAL LOW (ref 3.5–5.1)
Sodium: 139 mmol/L (ref 135–145)

## 2023-01-14 MED ORDER — SODIUM CHLORIDE 0.9 % IV BOLUS (SEPSIS)
1000.0000 mL | Freq: Once | INTRAVENOUS | Status: AC
  Administered 2023-01-14: 1000 mL via INTRAVENOUS

## 2023-01-14 MED ORDER — SODIUM CHLORIDE 0.9 % IV SOLN
1000.0000 mL | INTRAVENOUS | Status: DC
  Administered 2023-01-15: 1000 mL via INTRAVENOUS

## 2023-01-14 NOTE — ED Triage Notes (Signed)
Pt arrived from home via [facility] c/o near syncopal episode in which she was eating dinner stood up and felt like she was going to go out, started to fall and her husband caught her while still standing, then came back to. Pt feels weak.

## 2023-01-15 DIAGNOSIS — R55 Syncope and collapse: Secondary | ICD-10-CM | POA: Diagnosis not present

## 2023-01-15 LAB — MAGNESIUM: Magnesium: 2.3 mg/dL (ref 1.7–2.4)

## 2023-01-15 MED ORDER — ONDANSETRON 8 MG PO TBDP: 8.0000 mg | ORAL_TABLET | Freq: Three times a day (TID) | ORAL | 0 refills | Status: DC | PRN

## 2023-01-15 MED ORDER — ONDANSETRON HCL 4 MG/2ML IJ SOLN
4.0000 mg | Freq: Once | INTRAMUSCULAR | Status: AC
  Administered 2023-01-15: 4 mg via INTRAVENOUS
  Filled 2023-01-15: qty 2

## 2023-01-15 MED ORDER — POTASSIUM CHLORIDE CRYS ER 20 MEQ PO TBCR
40.0000 meq | EXTENDED_RELEASE_TABLET | Freq: Once | ORAL | Status: AC
  Administered 2023-01-15: 40 meq via ORAL
  Filled 2023-01-15: qty 2

## 2023-01-15 MED ORDER — POTASSIUM CHLORIDE ER 20 MEQ PO TBCR: 20.0000 meq | EXTENDED_RELEASE_TABLET | Freq: Two times a day (BID) | ORAL | 0 refills | Status: DC

## 2023-01-15 NOTE — ED Notes (Signed)
Pt ambulated to bathroom and back with no problems

## 2023-01-15 NOTE — ED Provider Notes (Signed)
[place] EMERGENCY DEPARTMENT AT [facility] Provider Note   CSN: [id number] Arrival date & time: 01/14/23  2218     History  Chief Complaint  Patient presents with   Near Syncope    Sara [name] is a 77 y.o. female.  The history is provided by the patient.  Near Syncope  She has history of hypertension, hyperlipidemia, Graves' disease, peptic ulcer disease, breast cancer (cancer free since 2016), renal cell carcinoma (cancer free since 2017) and comes in and comes in because of an episode of near syncope.  She states she was sitting down watching television when she got dizzy and lightheaded.  She denies chest pain, dyspnea.  There was some nausea.  She denies diaphoresis.  She continues to have some mild nausea.  She had recently been admitted to the hospital for evaluation of syncope and had a cardiac event monitor which she had just removed and sent in for testing several days ago.  She feels that she is back to her baseline now.   Home Medications Prior to Admission medications   Medication Sig Start Date End Date Taking? Authorizing Provider  acetaminophen (TYLENOL) 500 MG tablet Take 650 mg by mouth in the morning and at bedtime.    [provider]  aspirin EC 81 MG tablet Take 81 mg by mouth daily. Swallow whole.    [provider]  atorvastatin (LIPITOR) 20 MG tablet Take 1 tablet (20 mg total) by mouth daily. 03/07/20 01/01/23  Alwyn Ren, MD  diphenhydrAMINE (BENADRYL) 50 MG capsule Take 50 mg by mouth at bedtime.    [provider]  gabapentin (NEURONTIN) 600 MG tablet TAKE 1 TABLET BY MOUTH TWICE  DAILY Patient taking differently: Take 600 mg by mouth 2 (two) times daily. 10/19/22   Vickki Hearing, MD  levothyroxine (SYNTHROID) 150 MCG tablet Take 1 tablet (150 mcg total) by mouth daily. 12/23/22   Osvaldo Shipper, MD  Multiple Vitamins-Minerals (CENTRUM SILVER PO) Take 1 tablet by mouth daily.    [provider]  omeprazole (PRILOSEC) 20 MG capsule Take 20 mg by mouth daily. May take a second 20 mg dose as needed for heartburn    [provider]  oxybutynin (DITROPAN) 5 MG tablet Take 5 mg by mouth 2 (two) times daily.  07/31/15   [provider]  potassium chloride (KLOR-CON) 10 MEQ tablet Take 1 tablet (10 mEq total) by mouth daily. 01/01/23   Strader, Lennart Pall, PA-C  torsemide (DEMADEX) 20 MG tablet Take 3 tablets (60 mg total) by mouth daily. 01/01/23 12/27/23  Strader, Lennart Pall, PA-C  Vitamin D, Ergocalciferol, (DRISDOL) 1.25 MG (50000 UNIT) CAPS capsule TAKE 1 CAPSULE BY MOUTH WEEKLY 07/10/22   Georga Kaufmann T, PA-C  Ranitidine HCl (ZANTAC 75 PO) Take by mouth.    12/01/11  [provider]      Allergies    Propoxyphene hcl, Pollen extract, Camphor, Dust mite extract, Latex, Molds & smuts, and Tomato    Review of Systems   Review of Systems  Cardiovascular:  Positive for near-syncope.  All other systems reviewed and are negative.   Physical Exam Updated Vital Signs BP (!) 99/51 (BP Location: Left Arm)   Pulse 74   Temp 98 F (36.7 C) (Oral)   Resp 17   SpO2 97%  Physical Exam Vitals and nursing note reviewed.   77 year old female, resting comfortably and in no acute distress. Vital signs are significant for borderline  low blood pressure. Oxygen saturation is 97%, which is normal. Head is normocephalic and atraumatic. PERRLA, EOMI. Oropharynx is clear. Neck is nontender and supple without adenopathy or JVD.  There are no carotid bruits. Back is nontender and there is no CVA tenderness. Lungs are clear without rales, wheezes, or rhonchi. Chest is nontender. Heart has regular rate and rhythm without murmur. Abdomen is soft, flat, nontender. Extremities have no cyanosis or edema, full range of motion is present. Skin is warm and dry without rash. Neurologic: Mental status is normal, cranial nerves are intact, moves all extremities equally.  ED Results /  Procedures / Treatments   Labs (all labs ordered are listed, but only abnormal results are displayed) Labs Reviewed  BASIC METABOLIC PANEL - Abnormal; Notable for the following components:      Result Value   Potassium 3.2 (*)    Chloride 97 (*)    Glucose, Bld 102 (*)    Creatinine, Ser 1.17 (*)    Calcium 8.3 (*)    GFR, Estimated 49 (*)    All other components within normal limits  CBC  MAGNESIUM    EKG EKG Interpretation  Date/Time:  Thursday January 14 2023 23:05:00 EDT Ventricular Rate:  73 PR Interval:  158 QRS Duration: 84 QT Interval:  448 QTC Calculation: 494 R Axis:   -50 Text Interpretation: Sinus rhythm Left atrial enlargement Left anterior fascicular block Probable anterior infarct, age indeterminate When compared with ECG of 12/20/2022, No significant change was found Confirmed by Dione Booze ([id number]) on 01/14/2023 11:17:12 PM  Procedures Procedures  Cardiac monitor shows normal sinus rhythm, per my interpretation.  Medications Ordered in ED Medications  sodium chloride 0.9 % bolus 1,000 mL (0 mLs Intravenous Stopped 01/15/23 0119)    Followed by  0.9 %  sodium chloride infusion (1,000 mLs Intravenous New Bag/Given 01/15/23 0119)  potassium chloride SA (KLOR-CON M) CR tablet 40 mEq (40 mEq Oral Given 01/15/23 0119)  ondansetron (ZOFRAN) injection 4 mg (4 mg Intravenous Given 01/15/23 0118)    ED Course/ Medical Decision Making/ A&P                             Medical Decision Making Amount and/or Complexity of Data Reviewed Labs: ordered.  Risk Prescription drug management.   Episode of near syncope.  Episode does not seem like orthostatic syncope or vasovagal syncope, no palpitations to suggest arrhythmia, but did have recent cardiac event monitor.  I reviewed her past records, and she had been admitted on on 12/20/2022 for syncope.  Workup was significant for echocardiogram showing normal ejection fraction and grade 1 diastolic dysfunction as well as  small and stable pericardial effusion.  Blood pressure was low and she had metoprolol and spironolactone discontinued.  Cardiology office visit on 01/01/2023 suggested syncope may have been from dehydration.  I have reviewed and interpreted her laboratory tests, and my interpretation is hypokalemia likely related to recent discontinuation of spironolactone, borderline elevated random glucose of questionable significance, mildly elevated serum creatinine essentially the same as in addition recently, normal CBC.  I have reviewed and interpreted her electrocardiogram, and my interpretation is sinus rhythm with left anterior fascicular block unchanged from prior.  Her cardiologist had ordered basic metabolic panel and magnesium level to be drawn in the morning of 4/26, therefore I have ordered a magnesium level so the patient does not have to return for an additional blood draw.  I have ordered  ondansetron because she continues to have some mild nausea.  I have ordered a trial of ambulation.  With recent inpatient workup and nothing worrisome on exam or laboratory testing, I do not feel she needs to be admitted.  Magnesium level is normal.  She is able to ambulate without difficulty.  I am discharging her with a prescription for an increase in her home dose of potassium, and also prescription for ondansetron oral dissolving tablet.  Follow-up with her cardiologist for assessment of findings on her Zio patch.  Final Clinical Impression(s) / ED Diagnoses Final diagnoses:  Diuretic-induced hypokalemia  Syncope, unspecified syncope type  Nausea    Rx / DC Orders ED Discharge Orders          Ordered    potassium chloride 20 MEQ TBCR  2 times daily        01/15/23 0122    ondansetron (ZOFRAN-ODT) 8 MG disintegrating tablet  Every 8 hours PRN        01/15/23 0122    Ambulatory referral to Cardiology       Comments: If you have not heard from the Cardiology office within the next 72 hours please call  [phone].   01/15/23 0123              Dione Booze, MD 01/15/23 [phone]

## 2023-01-19 DIAGNOSIS — I951 Orthostatic hypotension: Secondary | ICD-10-CM | POA: Diagnosis not present

## 2023-01-19 DIAGNOSIS — R600 Localized edema: Secondary | ICD-10-CM | POA: Diagnosis not present

## 2023-01-19 DIAGNOSIS — R945 Abnormal results of liver function studies: Secondary | ICD-10-CM | POA: Diagnosis not present

## 2023-01-19 DIAGNOSIS — I10 Essential (primary) hypertension: Secondary | ICD-10-CM | POA: Diagnosis not present

## 2023-01-19 DIAGNOSIS — G4733 Obstructive sleep apnea (adult) (pediatric): Secondary | ICD-10-CM | POA: Diagnosis not present

## 2023-01-19 DIAGNOSIS — E039 Hypothyroidism, unspecified: Secondary | ICD-10-CM | POA: Diagnosis not present

## 2023-01-21 ENCOUNTER — Encounter: Payer: Self-pay | Admitting: Nurse Practitioner

## 2023-01-21 ENCOUNTER — Ambulatory Visit: Payer: Medicare Other | Attending: Nurse Practitioner | Admitting: Nurse Practitioner

## 2023-01-21 VITALS — BP 112/62 | HR 78 | Ht 66.0 in | Wt 221.2 lb

## 2023-01-21 DIAGNOSIS — I10 Essential (primary) hypertension: Secondary | ICD-10-CM

## 2023-01-21 DIAGNOSIS — R600 Localized edema: Secondary | ICD-10-CM

## 2023-01-21 DIAGNOSIS — R0789 Other chest pain: Secondary | ICD-10-CM | POA: Diagnosis not present

## 2023-01-21 DIAGNOSIS — I2510 Atherosclerotic heart disease of native coronary artery without angina pectoris: Secondary | ICD-10-CM | POA: Diagnosis not present

## 2023-01-21 DIAGNOSIS — R002 Palpitations: Secondary | ICD-10-CM | POA: Diagnosis not present

## 2023-01-21 DIAGNOSIS — I471 Supraventricular tachycardia, unspecified: Secondary | ICD-10-CM

## 2023-01-21 DIAGNOSIS — E785 Hyperlipidemia, unspecified: Secondary | ICD-10-CM

## 2023-01-21 DIAGNOSIS — G4733 Obstructive sleep apnea (adult) (pediatric): Secondary | ICD-10-CM | POA: Diagnosis not present

## 2023-01-21 DIAGNOSIS — Z87898 Personal history of other specified conditions: Secondary | ICD-10-CM | POA: Diagnosis not present

## 2023-01-21 DIAGNOSIS — R0602 Shortness of breath: Secondary | ICD-10-CM | POA: Diagnosis not present

## 2023-01-21 MED ORDER — METOPROLOL TARTRATE 25 MG PO TABS: 12.5000 mg | ORAL_TABLET | Freq: Two times a day (BID) | ORAL | 3 refills | Status: DC

## 2023-01-21 NOTE — Progress Notes (Signed)
Office Visit    Patient Name: Sara [name] Date of Encounter: 01/21/2023  Primary Care Provider:  Mirna Mires, MD Primary Cardiologist:  Nicki Guadalajara, MD  Chief Complaint    77 year old female with a history of CAD, SVT, syncope,bilateral lower extremity edema, hypertension, hyperlipidemia, and OSA who presents for ED follow-up related to syncope.  Past Medical History    Past Medical History:  Diagnosis Date   Allergic rhinitis due to pollen    Anxiety    Arthritis    "knees, back, right elbow; left shoulder; right ankle" (11/20/2014)   Breast cancer, right breast (HCC) 2016   "DCIS; zero stage" S/P mastectomy, cancer free since then   Cervical spondylosis without myelopathy    Cervicalgia    Chronic lower back pain    Constipation    takes Miralax daily   Degeneration of cervical intervertebral disc    Depression    Diverticulosis of colon (without mention of hemorrhage)    GERD (gastroesophageal reflux disease)    takes Omeprazole daily   Graves' disease    "took a pill to correct"   H/O hiatal hernia    H/O urinary frequency    Headache(784.0)    denies migraines since the 80's but has occ and takes Butalbital prn   HTN (hypertension)    takes Metoprolol and Lisinopril daily   Insomnia    takes Ativan nigtly   Joint pain    "all of them"   Joint swelling    "knees, legs, ankles sometimes" (11/20/2014)   Morbid obesity (HCC)    Numbness    LOWER LEGS   Other postablative hypothyroidism    takes SYnthroid daily   Palpitations    Peptic ulcer, unspecified site, unspecified as acute or chronic, without mention of hemorrhage, perforation, or obstruction    Primary localized osteoarthrosis, lower leg    Pure hypercholesterolemia    takes Pravastin daily   Recurrent UTI    Renal cell carcinoma (HCC) 01/08/2016   Right renal mass s/p partial right nephrectomy, cancer free since 2017   Shortness of breath    Sleep apnea    slight but doesn't require a CPAP  (11/20/2014)   Tendonitis of knee    bilateral   Past Surgical History:  Procedure Laterality Date   ABDOMINAL HYSTERECTOMY     ANTERIOR LAT LUMBAR FUSION  05/13/2012   Procedure: ANTERIOR LATERAL LUMBAR FUSION 2 LEVELS;  Surgeon: Reinaldo Meeker, MD;  Location: [facility];  Service: Neurosurgery;  Laterality: Left;  Left Lumbar Three-four,Lumbar four-five Extreme Lumbar Interbody Fusion with Percutaneous Pedicle Screws   APPENDECTOMY     BREAST BIOPSY Left ~ 2014   BREAST BIOPSY Right 2015   BREAST LUMPECTOMY Left 1988   BREAST RECONSTRUCTION WITH PLACEMENT OF TISSUE EXPANDER AND FLEX HD (ACELLULAR HYDRATED DERMIS) Right 11/20/2014   BREAST RECONSTRUCTION WITH PLACEMENT OF TISSUE EXPANDER AND FLEX HD (ACELLULAR HYDRATED DERMIS) Right 11/20/2014   Procedure: RIGHT BREAST RECONSTRUCTION WITH PLACEMENT OF TISSUE EXPANDER AND ACELLULAR DERMA MATRIX (ACELLULAR DERMA MATRIX);  Surgeon: Etter Sjogren, MD;  Location: [facility] OR;  Service: Plastics;  Laterality: Right;   CATARACT EXTRACTION W/ INTRAOCULAR LENS  IMPLANT, BILATERAL Bilateral    COLONOSCOPY  Sept 2008   RMR: normal rectum, left-sided diverticula, repeat in 2018   COLONOSCOPY WITH PROPOFOL N/A 03/21/2018   Dr. Jena Gauss: diverticulosis, internal grade I hemorrhoids. no future colonoscopies unless develops new symptoms   DIAGNOSTIC LAPAROSCOPY     DILATION AND CURETTAGE  OF UTERUS     ESOPHAGOGASTRODUODENOSCOPY     ESOPHAGOGASTRODUODENOSCOPY  2013   Dr. Jena Gauss: Cervical esophageal web and Schatzki's ring s/p dilation, small hiatal hernia, antral and duodenal erosions likely NSAID effect, chronic duodenitis on path    ESOPHAGOGASTRODUODENOSCOPY (EGD) WITH PROPOFOL N/A 03/21/2018   Dr. Jena Gauss: mild Schatzki ring s/p dilation   FOREIGN BODY REMOVAL Right 10/06/2013   Procedure: REMOVAL FOREIGN BODY EXTREMITY;  Surgeon: Vickki Hearing, MD;  Location: [facility];  Service: Orthopedics;  Laterality: Right;   INJECTION KNEE Left 10/06/2013   Procedure: KNEE  INJECTION;  Surgeon: Vickki Hearing, MD;  Location: [facility];  Service: Orthopedics;  Laterality: Left;   JOINT REPLACEMENT Bilateral    knees   KNEE ARTHROSCOPY Right    KNEE ARTHROSCOPY WITH LATERAL MENISECTOMY Right 10/06/2013   Procedure: KNEE ARTHROSCOPY WITH LATERAL AND MEDIAL MENISECTOMY;  Surgeon: Vickki Hearing, MD;  Location: [facility];  Service: Orthopedics;  Laterality: Right;  END @ 1234   lumpectomy on pelvis     "mass of nerves"   MALONEY DILATION N/A 03/21/2018   Procedure: MALONEY DILATION;  Surgeon: Corbin Ade, MD;  Location: [facility];  Service: Endoscopy;  Laterality: N/A;   MASTECTOMY COMPLETE / SIMPLE W/ SENTINEL NODE BIOPSY Right 11/20/2014   axillary   REMOVAL OF TISSUE EXPANDER AND PLACEMENT OF IMPLANT Right 12/13/2014   Procedure: REMOVAL OF RIGHT BREAST TISSUE EXPANDER;  Surgeon: Etter Sjogren, MD;  Location: [facility] OR;  Service: Plastics;  Laterality: Right;   ROBOTIC ASSITED PARTIAL NEPHRECTOMY Right 01/08/2016   Procedure: XI ROBOTIC ASSITED PARTIAL NEPHRECTOMY;  Surgeon: Sebastian Ache, MD;  Location: [facility];  Service: Urology;  Laterality: Right;   SIMPLE MASTECTOMY WITH AXILLARY SENTINEL NODE BIOPSY Right 11/20/2014   Procedure: SIMPLE MASTECTOMY WITH AXILLARY SENTINEL NODE BIOPSY;  Surgeon: Harriette Bouillon, MD;  Location: [facility] OR;  Service: General;  Laterality: Right;   TISSUE EXPANDER REMOVAL Right 12/13/2014   dr Odis Luster   TONSILLECTOMY     TOTAL KNEE ARTHROPLASTY Right 01/24/2014   Procedure: TOTAL KNEE ARTHROPLASTY;  Surgeon: Vickki Hearing, MD;  Location: [facility];  Service: Orthopedics;  Laterality: Right;   TOTAL KNEE ARTHROPLASTY Left 08/08/2014   Procedure: TOTAL KNEE ARTHROPLASTY;  Surgeon: Vickki Hearing, MD;  Location: [facility];  Service: Orthopedics;  Laterality: Left;   TUBAL LIGATION     UPPER GASTROINTESTINAL ENDOSCOPY      Allergies  Allergies  Allergen Reactions   Propoxyphene Hcl Other (See Comments)     Hallucinations   Pollen  Extract Other (See Comments)    Sneezing, congestion   Camphor Itching   Dust Mite Extract Itching    Sneezing. Runny nose    Latex Dermatitis and Rash   Molds & Smuts Itching   Tomato Hives     Labs/Other Studies Reviewed    The following studies were reviewed today: Event Monitor: 02/2022  2 episodes of NSVT, longest lasting 14 beats 56 episodes of SVT, longest lasting 57 seconds with average rate 127 bpm     Patch Wear Time:  13 days and 8 hours (2023-05-06T21:38:56-0400 to 2023-05-20T06:34:07-399)   Patient had a min HR of 50 bpm, max HR of 190 bpm, and avg HR of 68 bpm. Predominant underlying rhythm was Sinus Rhythm. 2 Ventricular Tachycardia runs occurred, the run with the fastest interval lasting 9 beats with a max rate of 190 bpm, the longest  lasting 14 beats with an avg rate of 135 bpm.  56 Supraventricular Tachycardia runs occurred, the run with the fastest interval lasting 17 beats with a max rate of 158 bpm, the longest lasting 57.3 secs with an avg rate of 127 bpm. True duration of  Supraventricular Tachycardia difficult to ascertain due to artifact. Supraventricular Tachycardia was detected within +/- 45 seconds of symptomatic patient event(s). Isolated SVEs were rare (<1.0%), SVE Couplets were rare (<1.0%), and SVE Triplets were  rare (<1.0%). Isolated VEs were rare (<1.0%), VE Couplets were rare (<1.0%), and no VE Triplets were present.  36 patient triggered events, corresponding to sinus rhythm  PACs/PVCs, SVT   Limited Echocardiogram: 12/2022 IMPRESSIONS     1. Left ventricular ejection fraction, by estimation, is 65 to 70%. The  left ventricle has normal function. Left ventricular diastolic parameters  are consistent with Grade I diastolic dysfunction (impaired relaxation).   2. A small pericardial effusion is present. The pericardial effusion is  lateral to the left ventricle, localized near the right atrium and  posterior to the left ventricle. There is no  evidence of cardiac  tamponade.   3. The inferior vena cava is normal in size with greater than 50%  respiratory variability, suggesting right atrial pressure of 3 mmHg.   Comparison(s): No significant change from prior study. Stable pericardial  effusion.    Carotid Dopplers: 12/2022  IMPRESSION: No significant stenosis of internal carotid arteries.    Recent Labs: 10/27/2022: BNP 33.0 12/21/2022: ALT 25 01/14/2023: BUN 12; Creatinine, Ser 1.17; Hemoglobin 14.1; Magnesium 2.3; Platelets 241; Potassium 3.2; Sodium 139  Recent Lipid Panel    Component Value Date/Time   CHOL 141 06/07/2020 0833   TRIG 71 06/07/2020 0833   HDL 57 06/07/2020 0833   CHOLHDL 2.5 06/07/2020 0833   VLDL 14 03/06/2020 0420   LDLCALC 69 06/07/2020 0833    History of Present Illness    77 year old female with the above past medical history including CAD, SVT, syncope,bilateral lower extremity edema, hypertension, hyperlipidemia, and OSA.  Coronary CT angiogram in 2021 showed 50 to 74% stenosis along the mid RCA, normal FFR, minimal plaque elsewhere.  History of bilateral lower extremity torsemide.  Echocardiogram in 10/2022 showed EF 70 to 25%, no RWMA, no significant valvular abnormalities.  She was hospitalized from 12/20/2022 to 12/22/2022 in the setting of syncope.  Orthostatics were negative, telemetry did not show any significant arrhythmia.  Limited echocardiogram showed preserved EF 65 to 70%, no RWMA, G1 DD, small pericardial effusion.  Carotid Doppler showed no significant stenosis.  Spironolactone was discontinued in the setting of AKI.  Metoprolol was also discontinued in the setting of hypotension.  During her workup, she was noted to have a prominent right axillary lymph node measuring 9 mm.  Outpatient follow-up with oncology was recommended.  Outpatient monitor (preliminary results) revealed predominantly sinus rhythm SVT, rare PACs and PVCs.  She was last seen in the office on 01/01/2023 and was stable  from a cardiac standpoint.  She noted ongoing dyspnea on exertion, ongoing bilateral lower edema.  Torsemide was increased to 60 mg daily.   She was seen in the ED on 01/15/2023 in the setting of near syncope.  Labs were unremarkable.  She was noted to be mildly hypokalemic.  She was started on potassium supplementation.  She was advised to follow-up with cardiology as an outpatient.  She presents today for follow-up accompanied by her husband. Since her last visit she has been stable overall from a cardiac standpoint.  She has had 1 other episode of  near syncope that occurred while she was sitting on her couch-she felt dizzy, her face got hot and the room was spinning.  She also reports a greater than 59-month history of shortness of breath both at rest and with activity. She also notes intermittent fleeting chest discomfort not necessarily associated with exertion.  She has mild nonpitting bilateral lower extremity edema, improved with increased torsemide dosing.  She states she has had a headache for 2 days with associated nausea, denies history of migraines.  She has taken Tylenol without relief.   Home Medications    Current Outpatient Medications  Medication Sig Dispense Refill   acetaminophen (TYLENOL) 500 MG tablet Take 650 mg by mouth in the morning and at bedtime.     aspirin EC 81 MG tablet Take 81 mg by mouth daily. Swallow whole.     atorvastatin (LIPITOR) 20 MG tablet Take 1 tablet (20 mg total) by mouth daily. 30 tablet 11   diphenhydrAMINE (BENADRYL) 50 MG capsule Take 50 mg by mouth at bedtime.     gabapentin (NEURONTIN) 600 MG tablet TAKE 1 TABLET BY MOUTH TWICE  DAILY (Patient taking differently: Take 600 mg by mouth 2 (two) times daily.) 200 tablet 2   levothyroxine (SYNTHROID) 150 MCG tablet Take 1 tablet (150 mcg total) by mouth daily.     Multiple Vitamins-Minerals (CENTRUM SILVER PO) Take 1 tablet by mouth daily.     omeprazole (PRILOSEC) 20 MG capsule Take 20 mg by mouth daily.  May take a second 20 mg dose as needed for heartburn     ondansetron (ZOFRAN-ODT) 8 MG disintegrating tablet Take 1 tablet (8 mg total) by mouth every 8 (eight) hours as needed for nausea or vomiting. 20 tablet 0   oxybutynin (DITROPAN) 5 MG tablet Take 5 mg by mouth 2 (two) times daily.      potassium chloride 20 MEQ TBCR Take 1 tablet (20 mEq total) by mouth 2 (two) times daily. Take 1 pill twice a day for 5 days, then 1 pill every day. 60 tablet 0   torsemide (DEMADEX) 20 MG tablet Take 3 tablets (60 mg total) by mouth daily. 270 tablet 3   Vitamin D, Ergocalciferol, (DRISDOL) 1.25 MG (50000 UNIT) CAPS capsule TAKE 1 CAPSULE BY MOUTH WEEKLY 15 capsule 2   No current facility-administered medications for this visit.     Review of Systems    She denies pnd, orthopnea, n, v, dizziness, syncope, weight gain, or early satiety. All other systems reviewed and are otherwise negative except as noted above.  Physical Exam    VS:  BP 112/62   Pulse 78   Ht 5\' 6"  (1.676 m)   Wt 221 lb 3.2 oz (100.3 kg)   BMI 35.70 kg/m  GEN: Well nourished, well developed, in no acute distress. HEENT: normal. Neck: Supple, no JVD, carotid bruits, or masses. Cardiac: RRR, no murmurs, rubs, or gallops. No clubbing, cyanosis, edema.  Radials/DP/PT 2+ and equal bilaterally.  Respiratory:  Respirations regular and unlabored, clear to auscultation bilaterally. GI: Soft, nontender, nondistended, BS + x 4. MS: no deformity or atrophy. Skin: warm and dry, no rash. Neuro:  Strength and sensation are intact. Psych: Normal affect.  Accessory Clinical Findings    ECG personally reviewed by me today -NSR, 78 bpm, LAD- no acute changes.   Lab Results  Component Value Date   WBC 8.6 01/14/2023   HGB 14.1 01/14/2023   HCT 42.3 01/14/2023   MCV 98.6 01/14/2023   PLT  241 01/14/2023   Lab Results  Component Value Date   CREATININE 1.17 (H) 01/14/2023   BUN 12 01/14/2023   NA 139 01/14/2023   K 3.2 (L) 01/14/2023    CL 97 (L) 01/14/2023   CO2 32 01/14/2023   Lab Results  Component Value Date   ALT 25 12/21/2022   AST 35 12/21/2022   ALKPHOS 66 12/21/2022   BILITOT 0.5 12/21/2022   Lab Results  Component Value Date   CHOL 141 06/07/2020   HDL 57 06/07/2020   LDLCALC 69 06/07/2020   TRIG 71 06/07/2020   CHOLHDL 2.5 06/07/2020    No results found for: "HGBA1C"  Assessment & Plan    1. CAD/chest pain/shortness of breath: Coronary CT angiogram in 2021 showed 50 to 74% stenosis along the mid RCA, normal FFR, minimal plaque elsewhere.  She reports a greater than 32-month history of shortness of breath both at rest and with activity. She notes intermittent fleeting chest discomfort not necessarily associated with exertion. Given history of CAD on prior coronary CT angiogram, shortness of breath, and recent syncope, will pursue cardiac PET stress test.  Continue aspirin, Lipitor. Shared Decision Making/Informed Consent The risks [chest pain, shortness of breath, cardiac arrhythmias, dizziness, blood pressure fluctuations, myocardial infarction, stroke/transient ischemic attack, nausea, vomiting, allergic reaction, radiation exposure, metallic taste sensation and life-threatening complications (estimated to be 1 in 10,000)], benefits (risk stratification, diagnosing coronary artery disease, treatment guidance) and alternatives of a cardiac PET stress test were discussed in detail with Ms. Manson and she agrees to proceed.  2. SVT: She notes intermittent palpitations that last less than 2 minutes at a time.  She notes episodes of near syncope, chest discomfort, and shortness of breath both at rest and with activity.  Question whether or not symptoms could be in the setting of SVT, will resume metoprolol 12.5 mg twice daily.  Discussed ED precautions. Continue to monitor symptoms.   3. Syncope: Echo in 10/2022 showed EF 70 to 25%, no RWMA, no significant valvular abnormalities. Outpatient monitor (preliminary  results) revealed predominantly sinus rhythm SVT, rare PACs and PVCs.  Carotid Doppler showed no significant stenosis.  Denies any recurrent syncope though she did have 1 episode of near syncope while sitting on her couch.  Will pursue cardiac PET stress test as above.  Otherwise, cardiac workup to date overall reassuring. Reviewed ED precautions.   3. Bilateral lower extremity edema: Improved with increased torsemide dosing.  Most recent echo as above. She does note some ongoing shortness of breath both at rest and with activity, nonpitting bilateral lower extremity edema.  Generally euvolemic and well compensated on exam.  Continue torsemide.  4. Hypertension: BP well controlled. Continue current antihypertensive regimen.   5. Hyperlipidemia: LDL was 110 in 09/2021, above goal.  Monitored per PCP.  Consider escalation of statin therapy at follow-up.  For now, continue Lipitor at current dose.  6. Hypokalemia: She states she had labs drawn with her PCP 2 days ago.  Will request most recent lab results.  Continue potassium supplementation.   7. OSA: Adherent to CPAP.  8. Headache: She notes a 2-day headache with associated nausea.  Denies history of migraines.  Recommend follow-up with PCP.  9. Disposition: Follow-up in 2 months.      Joylene Grapes, NP 01/21/2023, 9:10 AM

## 2023-01-21 NOTE — Patient Instructions (Signed)
Medication Instructions:  Start Metoprolol Tartrate 12. 5 mg twice daily.   *If you need a refill on your cardiac medications before your next appointment, please call your pharmacy*   Lab Work: NONE ordered at this time of appointment   If you have labs (blood work) drawn today and your tests are completely normal, you will receive your results only by: MyChart Message (if you have MyChart) OR A paper copy in the mail If you have any lab test that is abnormal or we need to change your treatment, we will call you to review the results.   Testing/Procedures: How to Prepare for Your Cardiac PET/CT Stress Test:  1. Please do not take these medications before your test:   Medications that may interfere with the cardiac pharmacological stress agent (ex. nitrates - including erectile dysfunction medications, isosorbide mononitrate, tamulosin or beta-blockers) the day of the exam. (Erectile dysfunction medication should be held for at least 72 hrs prior to test) Theophylline containing medications for 12 hours. Dipyridamole 48 hours prior to the test. Your remaining medications may be taken with water.  2. Nothing to eat or drink, except water, 3 hours prior to arrival time.   NO caffeine/decaffeinated products, or chocolate 12 hours prior to arrival.  3. NO perfume, cologne or lotion  4. Total time is 1 to 2 hours; you may want to bring reading material for the waiting time.  5. Please report to Radiology at the [facility] Main Entrance 30 minutes early for your test.  [address] [place], [place] [address]  Diabetic Preparation:  Hold oral medications. You may take NPH and Lantus insulin. Do not take Humalog or Humulin R (Regular Insulin) the day of your test. Check blood sugars prior to leaving the house. If able to eat breakfast prior to 3 hour fasting, you may take all medications, including your insulin, Do not worry if you miss your breakfast dose of insulin  - start at your next meal.  IF YOU THINK YOU MAY BE PREGNANT, OR ARE NURSING PLEASE INFORM THE TECHNOLOGIST.  In preparation for your appointment, medication and supplies will be purchased.  Appointment availability is limited, so if you need to cancel or reschedule, please call the Radiology Department at [phone]  24 hours in advance to avoid a cancellation fee of $100.00  What to Expect After you Arrive:  Once you arrive and check in for your appointment, you will be taken to a preparation room within the Radiology Department.  A technologist or Nurse will obtain your medical history, verify that you are correctly prepped for the exam, and explain the procedure.  Afterwards,  an IV will be started in your arm and electrodes will be placed on your skin for EKG monitoring during the stress portion of the exam. Then you will be escorted to the PET/CT scanner.  There, staff will get you positioned on the scanner and obtain a blood pressure and EKG.  During the exam, you will continue to be connected to the EKG and blood pressure machines.  A small, safe amount of a radioactive tracer will be injected in your IV to obtain a series of pictures of your heart along with an injection of a stress agent.    After your Exam:  It is recommended that you eat a meal and drink a caffeinated beverage to counter act any effects of the stress agent.  Drink plenty of fluids for the remainder of the day and urinate frequently  for the first couple of hours after the exam.  Your doctor will inform you of your test results within 7-10 business days.  For questions about your test or how to prepare for your test, please call: Rockwell Alexandria, Cardiac Imaging Nurse Navigator  Larey Brick, Cardiac Imaging Nurse Navigator Office: [phone]    Follow-Up: At [facility], you and your health needs are our priority.  As part of our continuing mission to provide you with exceptional heart care, we have  created designated Provider Care Teams.  These Care Teams include your primary Cardiologist (physician) and Advanced Practice Providers (APPs -  Physician Assistants and Nurse Practitioners) who all work together to provide you with the care you need, when you need it.  We recommend signing up for the patient portal called "MyChart".  Sign up information is provided on this After Visit Summary.  MyChart is used to connect with patients for Virtual Visits (Telemedicine).  Patients are able to view lab/test results, encounter notes, upcoming appointments, etc.  Non-urgent messages can be sent to your provider as well.   To learn more about what you can do with MyChart, go to [email or web address].    Your next appointment:   2 month(s)  Provider:   Bernadene Person, NP        Other Instructions

## 2023-01-27 DIAGNOSIS — M5416 Radiculopathy, lumbar region: Secondary | ICD-10-CM | POA: Diagnosis not present

## 2023-02-05 DIAGNOSIS — M5416 Radiculopathy, lumbar region: Secondary | ICD-10-CM | POA: Diagnosis not present

## 2023-02-07 DIAGNOSIS — G4733 Obstructive sleep apnea (adult) (pediatric): Secondary | ICD-10-CM | POA: Diagnosis not present

## 2023-02-16 DIAGNOSIS — E039 Hypothyroidism, unspecified: Secondary | ICD-10-CM | POA: Diagnosis not present

## 2023-02-16 DIAGNOSIS — M150 Primary generalized (osteo)arthritis: Secondary | ICD-10-CM | POA: Diagnosis not present

## 2023-02-16 DIAGNOSIS — R945 Abnormal results of liver function studies: Secondary | ICD-10-CM | POA: Diagnosis not present

## 2023-02-16 DIAGNOSIS — I10 Essential (primary) hypertension: Secondary | ICD-10-CM | POA: Diagnosis not present

## 2023-02-16 DIAGNOSIS — R600 Localized edema: Secondary | ICD-10-CM | POA: Diagnosis not present

## 2023-02-16 DIAGNOSIS — N3281 Overactive bladder: Secondary | ICD-10-CM | POA: Diagnosis not present

## 2023-02-19 ENCOUNTER — Other Ambulatory Visit: Payer: Self-pay

## 2023-02-19 DIAGNOSIS — I471 Supraventricular tachycardia, unspecified: Secondary | ICD-10-CM

## 2023-02-22 ENCOUNTER — Encounter (HOSPITAL_COMMUNITY): Payer: Self-pay | Admitting: Physical Therapy

## 2023-02-22 ENCOUNTER — Other Ambulatory Visit: Payer: Self-pay

## 2023-02-22 ENCOUNTER — Ambulatory Visit (HOSPITAL_COMMUNITY): Payer: Medicare Other | Attending: Neurosurgery | Admitting: Physical Therapy

## 2023-02-22 DIAGNOSIS — M6281 Muscle weakness (generalized): Secondary | ICD-10-CM | POA: Diagnosis not present

## 2023-02-22 DIAGNOSIS — M5459 Other low back pain: Secondary | ICD-10-CM | POA: Insufficient documentation

## 2023-02-22 DIAGNOSIS — R29898 Other symptoms and signs involving the musculoskeletal system: Secondary | ICD-10-CM | POA: Insufficient documentation

## 2023-02-22 DIAGNOSIS — R2689 Other abnormalities of gait and mobility: Secondary | ICD-10-CM | POA: Diagnosis not present

## 2023-02-22 NOTE — Therapy (Signed)
OUTPATIENT PHYSICAL THERAPY THORACOLUMBAR EVALUATION   Patient Name: Sara [name] MRN: [id number] DOB:[birth year], 77 y.o., female Today's Date: 02/22/2023  END OF SESSION:  PT End of Session - 02/22/23 0912     Visit Number 1    Number of Visits 1    Date for PT Re-Evaluation 02/22/23    Authorization Type [employer] Medicare    PT Start Time 0913   arrives late/delayed check in   PT Stop Time 0945    PT Time Calculation (min) 32 min    Activity Tolerance Patient tolerated treatment well    Behavior During Therapy [facility] for tasks assessed/performed             Past Medical History:  Diagnosis Date   Allergic rhinitis due to pollen    Anxiety    Arthritis    "knees, back, right elbow; left shoulder; right ankle" (11/20/2014)   Breast cancer, right breast (HCC) 2016   "DCIS; zero stage" S/P mastectomy, cancer free since then   Cervical spondylosis without myelopathy    Cervicalgia    Chronic lower back pain    Constipation    takes Miralax daily   Degeneration of cervical intervertebral disc    Depression    Diverticulosis of colon (without mention of hemorrhage)    GERD (gastroesophageal reflux disease)    takes Omeprazole daily   Graves' disease    "took a pill to correct"   H/O hiatal hernia    H/O urinary frequency    Headache(784.0)    denies migraines since the 80's but has occ and takes Butalbital prn   HTN (hypertension)    takes Metoprolol and Lisinopril daily   Insomnia    takes Ativan nigtly   Joint pain    "all of them"   Joint swelling    "knees, legs, ankles sometimes" (11/20/2014)   Morbid obesity (HCC)    Numbness    LOWER LEGS   Other postablative hypothyroidism    takes SYnthroid daily   Palpitations    Peptic ulcer, unspecified site, unspecified as acute or chronic, without mention of hemorrhage, perforation, or obstruction    Primary localized osteoarthrosis, lower leg    Pure hypercholesterolemia    takes Pravastin daily   Recurrent UTI     Renal cell carcinoma (HCC) 01/08/2016   Right renal mass s/p partial right nephrectomy, cancer free since 2017   Shortness of breath    Sleep apnea    slight but doesn't require a CPAP (11/20/2014)   Tendonitis of knee    bilateral   Past Surgical History:  Procedure Laterality Date   ABDOMINAL HYSTERECTOMY     ANTERIOR LAT LUMBAR FUSION  05/13/2012   Procedure: ANTERIOR LATERAL LUMBAR FUSION 2 LEVELS;  Surgeon: Reinaldo Meeker, MD;  Location: [facility];  Service: Neurosurgery;  Laterality: Left;  Left Lumbar Three-four,Lumbar four-five Extreme Lumbar Interbody Fusion with Percutaneous Pedicle Screws   APPENDECTOMY     BREAST BIOPSY Left ~ 2014   BREAST BIOPSY Right 2015   BREAST LUMPECTOMY Left 1988   BREAST RECONSTRUCTION WITH PLACEMENT OF TISSUE EXPANDER AND FLEX HD (ACELLULAR HYDRATED DERMIS) Right 11/20/2014   BREAST RECONSTRUCTION WITH PLACEMENT OF TISSUE EXPANDER AND FLEX HD (ACELLULAR HYDRATED DERMIS) Right 11/20/2014   Procedure: RIGHT BREAST RECONSTRUCTION WITH PLACEMENT OF TISSUE EXPANDER AND ACELLULAR DERMA MATRIX (ACELLULAR DERMA MATRIX);  Surgeon: Etter Sjogren, MD;  Location: [facility] OR;  Service: Plastics;  Laterality: Right;   CATARACT EXTRACTION W/ INTRAOCULAR LENS  IMPLANT, BILATERAL Bilateral    COLONOSCOPY  Sept 2008   RMR: normal rectum, left-sided diverticula, repeat in 2018   COLONOSCOPY WITH PROPOFOL N/A 03/21/2018   Dr. Jena Gauss: diverticulosis, internal grade I hemorrhoids. no future colonoscopies unless develops new symptoms   DIAGNOSTIC LAPAROSCOPY     DILATION AND CURETTAGE OF UTERUS     ESOPHAGOGASTRODUODENOSCOPY     ESOPHAGOGASTRODUODENOSCOPY  2013   Dr. Jena Gauss: Cervical esophageal web and Schatzki's ring s/p dilation, small hiatal hernia, antral and duodenal erosions likely NSAID effect, chronic duodenitis on path    ESOPHAGOGASTRODUODENOSCOPY (EGD) WITH PROPOFOL N/A 03/21/2018   Dr. Jena Gauss: mild Schatzki ring s/p dilation   FOREIGN BODY REMOVAL Right 10/06/2013    Procedure: REMOVAL FOREIGN BODY EXTREMITY;  Surgeon: Vickki Hearing, MD;  Location: [facility];  Service: Orthopedics;  Laterality: Right;   INJECTION KNEE Left 10/06/2013   Procedure: KNEE INJECTION;  Surgeon: Vickki Hearing, MD;  Location: [facility];  Service: Orthopedics;  Laterality: Left;   JOINT REPLACEMENT Bilateral    knees   KNEE ARTHROSCOPY Right    KNEE ARTHROSCOPY WITH LATERAL MENISECTOMY Right 10/06/2013   Procedure: KNEE ARTHROSCOPY WITH LATERAL AND MEDIAL MENISECTOMY;  Surgeon: Vickki Hearing, MD;  Location: [facility];  Service: Orthopedics;  Laterality: Right;  END @ 1234   lumpectomy on pelvis     "mass of nerves"   MALONEY DILATION N/A 03/21/2018   Procedure: MALONEY DILATION;  Surgeon: Corbin Ade, MD;  Location: [facility];  Service: Endoscopy;  Laterality: N/A;   MASTECTOMY COMPLETE / SIMPLE W/ SENTINEL NODE BIOPSY Right 11/20/2014   axillary   REMOVAL OF TISSUE EXPANDER AND PLACEMENT OF IMPLANT Right 12/13/2014   Procedure: REMOVAL OF RIGHT BREAST TISSUE EXPANDER;  Surgeon: Etter Sjogren, MD;  Location: [facility] OR;  Service: Plastics;  Laterality: Right;   ROBOTIC ASSITED PARTIAL NEPHRECTOMY Right 01/08/2016   Procedure: XI ROBOTIC ASSITED PARTIAL NEPHRECTOMY;  Surgeon: Sebastian Ache, MD;  Location: [facility];  Service: Urology;  Laterality: Right;   SIMPLE MASTECTOMY WITH AXILLARY SENTINEL NODE BIOPSY Right 11/20/2014   Procedure: SIMPLE MASTECTOMY WITH AXILLARY SENTINEL NODE BIOPSY;  Surgeon: Harriette Bouillon, MD;  Location: [facility] OR;  Service: General;  Laterality: Right;   TISSUE EXPANDER REMOVAL Right 12/13/2014   dr Odis Luster   TONSILLECTOMY     TOTAL KNEE ARTHROPLASTY Right 01/24/2014   Procedure: TOTAL KNEE ARTHROPLASTY;  Surgeon: Vickki Hearing, MD;  Location: [facility];  Service: Orthopedics;  Laterality: Right;   TOTAL KNEE ARTHROPLASTY Left 08/08/2014   Procedure: TOTAL KNEE ARTHROPLASTY;  Surgeon: Vickki Hearing, MD;  Location: [facility];  Service: Orthopedics;  Laterality:  Left;   TUBAL LIGATION     UPPER GASTROINTESTINAL ENDOSCOPY     Patient Active Problem List   Diagnosis Date Noted   AKI (acute kidney injury) (HCC) 12/21/2022   Hypoalbuminemia 12/21/2022   Syncope 12/20/2022   Depression 03/05/2020   Hypokalemia    Encounter for screening colonoscopy 01/25/2018   Renal cell carcinoma (HCC) 01/08/2016   Right kidney mass 12/23/2015   Liver lesion 12/23/2015   Malignant neoplasm of upper outer quadrant of female breast (HCC) 10/11/2014   S/P total knee replacement, right 01/24/14 08/08/2014   Difficulty in walking(719.7) 02/15/2014   Postoperative stiffness of total knee replacement (HCC) 02/15/2014   Pain in joint, lower leg 02/15/2014   Abnormality of gait 10/11/2013   S/P total knee replacement, left 08/08/14 10/09/2013   Radicular pain of right lower extremity 07/25/2013  Knee instability 07/25/2013   Right knee pain 07/25/2013   Atypical chest pain 04/25/2013   Patellar tendonitis 09/07/2012   OA (osteoarthritis) of knee 02/09/2012   GERD (gastroesophageal reflux disease) 12/01/2011   Dysphagia 12/01/2011   RUQ pain 12/01/2011   Fatty liver 12/01/2011   Hematochezia 12/01/2011   INTERMITTENT VERTIGO 09/12/2010   KNEE, ARTHRITIS, DEGEN./OSTEO 04/03/2009   CHEST PAIN, RECURRENT 03/07/2009   OTHER DYSPHAGIA 03/07/2009   PALPITATIONS 02/06/2009   LIVER FUNCTION TESTS, ABNORMAL, HX OF 01/01/2009   PUD 12/31/2008   BACK PAIN 12/24/2008   ANSERINE BURSITIS 12/24/2008   BENIGN POSITIONAL VERTIGO 07/16/2008   H N P-LUMBAR 04/02/2008   SPINAL STENOSIS, CERVICAL 02/15/2008   SPINAL STENOSIS, LUMBAR 02/15/2008   Patellar tendinitis 10/19/2007   TEAR LATERAL MENISCUS 06/13/2007   OBESITY NOS 10/29/2006   ALLERGIC RHINITIS, SEASONAL 10/29/2006   Acquired hypothyroidism 09/07/2006   Hyperlipidemia 09/07/2006   Essential hypertension 09/07/2006   GERD 09/07/2006   DIVERTICULOSIS, COLON 09/07/2006   OVERACTIVE BLADDER 09/07/2006    FIBROCYSTIC BREAST DISEASE 09/07/2006   OSTEOARTHRITIS 09/07/2006    PCP: Mirna Mires MD  REFERRING PROVIDER: Julio Sicks, MD  REFERRING DIAG: M54.16 (ICD-10-CM) - Radiculopathy, lumbar region  Rationale for Evaluation and Treatment: Rehabilitation  THERAPY DIAG:  Other low back pain  Muscle weakness (generalized)  Other abnormalities of gait and mobility  Other symptoms and signs involving the musculoskeletal system  ONSET DATE: 3-4 months  SUBJECTIVE:                                                                                                                                                                                           SUBJECTIVE STATEMENT: Patient states she just had an injection recently which helped. Patient with history of back issues but most recent flare up for past 3-4 months. She has 2 back surgeries. Limits her lifting to under 20 lbs, that is about how big her grandchild is. Walking and balance bother her the most. She gets sciatica symptoms when sitting in her recliner. Patient states after she got set up for injections, MD wanted her to try PT. Walks for exercise.   PERTINENT HISTORY:  Anxiety, depression, increased BMI, HTN, HLD, hx spinal stenosis, Hx TKA, posterior decompression and fusion from L3 through L5  PAIN:  Are you having pain? No at rest  PRECAUTIONS: None  WEIGHT BEARING RESTRICTIONS: No  FALLS:  Has patient fallen in last 6 months? No  OCCUPATION: Retired  PLOF: Independent  PATIENT GOALS: feel better   OBJECTIVE:   DIAGNOSTIC FINDINGS:  MRI 12/14/22 IMPRESSION: 1. Stable postsurgical changes from posterior decompression and  fusion from L3 through L5. The spinal canal and neural foramina are well decompressed at the operative levels. 2. Progressive adjacent segment disease at L2-3 with resulting moderate multifactorial spinal stenosis and mild lateral recess and foraminal narrowing bilaterally. 3. Chronic  degenerative disc disease with progressive loss of disc height at L5-S1, chronic right lateral recess narrowing and possible chronic right S1 nerve root encroachment. Chronic right-greater-than-left foraminal narrowing appears similar. 4. No acute osseous findings.  No evidence of metastatic disease.    SCREENING FOR RED FLAGS: Bowel or bladder incontinence: No Spinal tumors: No Cauda equina syndrome: No Compression fracture: No Abdominal aneurysm: No  COGNITION: Overall cognitive status: Within functional limits for tasks assessed     SENSATION: Light touch: Decreased L3-L4 on L, decreased S1 on R   POSTURE: rounded shoulders, forward head, increased lumbar lordosis, increased thoracic kyphosis, anterior pelvic tilt, and flexed trunk   PALPATION: TTP lower R lumbar paraspinals and R glutes  LUMBAR ROM:   AROM eval  Flexion 0% limited  Extension 25% limited (felt good)  Right lateral flexion 0% limited  Left lateral flexion 0% limited  Right rotation 0% limited  Left rotation 0% limited   (Blank rows = not tested)  LOWER EXTREMITY ROM:   WFL for tasks assessed  Active  Right eval Left eval  Hip flexion    Hip extension    Hip abduction    Hip adduction    Hip internal rotation    Hip external rotation    Knee flexion    Knee extension    Ankle dorsiflexion    Ankle plantarflexion    Ankle inversion    Ankle eversion     (Blank rows = not tested)  LOWER EXTREMITY MMT:    MMT Right eval Left eval  Hip flexion 4+ 4+  Hip extension 4- 4-  Hip abduction 4 4  Hip adduction    Hip internal rotation    Hip external rotation    Knee flexion 5 5  Knee extension 5 5  Ankle dorsiflexion 5 5  Ankle plantarflexion    Ankle inversion    Ankle eversion     (Blank rows = not tested)    FUNCTIONAL TESTS:  STS with hand on thighs  GAIT: Distance walked: 100 feet Assistive device utilized: None Level of assistance: Complete Independence Comments: Trunk  slightly flexed  TODAY'S TREATMENT:                                                                                                                              DATE:  02/22/23 HEP development Standing lumbar extension 1 x 10 Bridge 1 x 10 Sidelying hip abduction 1 x 10 bilateral Prone hip extension 1 x 10 bilateral   SLR 1 x 10 bilateral    PATIENT EDUCATION:  Education details: Patient educated on exam findings, POC, scope of PT, HEP, and returning to PT if needed. Person educated: Patient Education  method: Explanation, Demonstration, and Handouts Education comprehension: verbalized understanding, returned demonstration, verbal cues required, and tactile cues required  HOME EXERCISE PROGRAM: Access Code: [email or web address] URL: [email or web address] Date: 02/22/2023 - Standing Lumbar Extension  - 2-3 x daily - 7 x weekly - 2 sets - 10 reps - Supine Bridge  - 2-3 x daily - 7 x weekly - 2 sets - 10 reps - Sidelying Hip Abduction  - 2-3 x daily - 7 x weekly - 2 sets - 10 reps - Prone Hip Extension  - 2-3 x daily - 7 x weekly - 2 sets - 10 reps - Supine Active Straight Leg Raise  - 2-3 x daily - 7 x weekly - 2 sets - 10 reps  ASSESSMENT:  CLINICAL IMPRESSION: Patient a 77 y.o. y.o. female who was seen today for physical therapy evaluation and treatment for lumbar radiculopathy. Patient presents with pain limited deficits in lumbar spine and LE strength, ROM, endurance, activity tolerance, and functional mobility with ADL. Patient is having to modify and restrict ADL as indicated by  subjective information and objective measures which is affecting overall participation. Patient wishes to perform HEP due to financial bourdon of attending formal PT. Patient given HEP and is educated on returning to PT if needed.   OBJECTIVE IMPAIRMENTS: Abnormal gait, decreased activity tolerance, decreased balance, decreased endurance, decreased mobility, difficulty walking, decreased ROM, decreased  strength, improper body mechanics, postural dysfunction, and pain.   ACTIVITY LIMITATIONS: carrying, lifting, bending, standing, squatting, stairs, transfers, locomotion level, and caring for others  PARTICIPATION LIMITATIONS: meal prep, cleaning, laundry, shopping, community activity, and yard work  PERSONAL FACTORS: Age, Time since onset of injury/illness/exacerbation, and 3+ comorbidities: Anxiety, depression, increased BMI, HTN, HLD, hx spinal stenosis, Hx TKA, posterior decompression and fusion from L3 through L5  are also affecting patient's functional outcome.   REHAB POTENTIAL: Good  CLINICAL DECISION MAKING: Stable/uncomplicated  EVALUATION COMPLEXITY: Low   GOALS: Goals reviewed with patient? Yes  SHORT TERM GOALS: Target date: 02/22/23  Patient educated on exam findings, POC, scope of PT, HEP, and returning to PT if needed. Baseline:  Goal status: MET    PLAN:  PT FREQUENCY: one time visit  PT DURATION:  1 sessions  PLANNED INTERVENTIONS: Therapeutic exercises, Therapeutic activity, Neuromuscular re-education, Balance training, Gait training, Patient/Family education, Joint manipulation, Joint mobilization, Stair training, Orthotic/Fit training, DME instructions, Aquatic Therapy, Dry Needling, Electrical stimulation, Spinal manipulation, Spinal mobilization, Cryotherapy, Moist heat, Compression bandaging, scar mobilization, Splintting, Taping, Traction, Ultrasound, Ionotophoresis 4mg /ml Dexamethasone, and Manual therapy  PLAN FOR NEXT SESSION: n/a   Reola Mosher Nashay Brickley, PT 02/22/2023, 9:48 AM

## 2023-02-25 ENCOUNTER — Other Ambulatory Visit: Payer: Self-pay | Admitting: Cardiovascular Disease

## 2023-02-26 DIAGNOSIS — E039 Hypothyroidism, unspecified: Secondary | ICD-10-CM | POA: Diagnosis not present

## 2023-03-10 DIAGNOSIS — G4733 Obstructive sleep apnea (adult) (pediatric): Secondary | ICD-10-CM | POA: Diagnosis not present

## 2023-03-19 ENCOUNTER — Telehealth: Payer: Self-pay | Admitting: *Deleted

## 2023-03-19 NOTE — Telephone Encounter (Signed)
Left voice message regarding interest in participating in upcoming July PREP Class. Patient had to withdraw last session for health issues which were affecting attendance. Instructed to call me for more information.

## 2023-03-23 ENCOUNTER — Ambulatory Visit: Payer: Medicare Other | Admitting: Nurse Practitioner

## 2023-03-28 ENCOUNTER — Other Ambulatory Visit: Payer: Self-pay | Admitting: Physician Assistant

## 2023-03-28 DIAGNOSIS — E559 Vitamin D deficiency, unspecified: Secondary | ICD-10-CM

## 2023-03-30 DIAGNOSIS — I10 Essential (primary) hypertension: Secondary | ICD-10-CM | POA: Diagnosis not present

## 2023-03-30 DIAGNOSIS — E039 Hypothyroidism, unspecified: Secondary | ICD-10-CM | POA: Diagnosis not present

## 2023-03-30 DIAGNOSIS — R945 Abnormal results of liver function studies: Secondary | ICD-10-CM | POA: Diagnosis not present

## 2023-03-30 DIAGNOSIS — R600 Localized edema: Secondary | ICD-10-CM | POA: Diagnosis not present

## 2023-03-31 ENCOUNTER — Encounter: Payer: Self-pay | Admitting: Cardiology

## 2023-04-02 ENCOUNTER — Encounter: Payer: Self-pay | Admitting: Student

## 2023-04-02 ENCOUNTER — Ambulatory Visit: Payer: Medicare Other | Attending: Student | Admitting: Student

## 2023-04-02 VITALS — BP 102/62 | HR 62 | Ht 66.0 in | Wt 213.0 lb

## 2023-04-02 DIAGNOSIS — I471 Supraventricular tachycardia, unspecified: Secondary | ICD-10-CM | POA: Diagnosis not present

## 2023-04-02 DIAGNOSIS — R600 Localized edema: Secondary | ICD-10-CM

## 2023-04-02 DIAGNOSIS — I2510 Atherosclerotic heart disease of native coronary artery without angina pectoris: Secondary | ICD-10-CM | POA: Diagnosis not present

## 2023-04-02 DIAGNOSIS — E785 Hyperlipidemia, unspecified: Secondary | ICD-10-CM | POA: Diagnosis not present

## 2023-04-02 DIAGNOSIS — R55 Syncope and collapse: Secondary | ICD-10-CM

## 2023-04-02 MED ORDER — METOPROLOL SUCCINATE ER 25 MG PO TB24: 25.0000 mg | ORAL_TABLET | Freq: Every day | ORAL | 1 refills | Status: DC

## 2023-04-02 NOTE — Progress Notes (Signed)
Cardiology Office Note    Date:  04/02/2023  ID:  Sara [name], DOB 07-Mar-1946, MRN [id number] Cardiologist: Nicki Guadalajara, MD    History of Present Illness:    Sara [name] is a 77 y.o. female with past medical history of syncope, HTN, HLD, SVT, history of OSA (on CPAP) and CAD (s/p Coronary CT in 02/2020 showing 50 to 74% stenosis along mid RCA but perhaps overestimated with normal FFR and minimal plaque elsewhere) who presents to the office today for follow-up.  She was examined by myself in 12/2022 following a recent admission for syncope and at the time of her visit reported still having dyspnea on exertion but denied any chest pain or palpitations. Her syncopal episode was felt to be secondary to dehydration given her AKI on admission and reassuring workup at that time.  In the interim, she presented to [name] on 01/14/2023 for evaluation of presyncope which occurred while watching television. Workup at that time was reassuring except she was noted to have mild hypokalemia with K+ at 3.2. She did follow-up with Sara Person, NP on 01/21/2023 and reported worsening dyspnea on exertion for the past 6 months along with intermittent episodes of chest discomfort which were not associated with exertion. It was recommended to proceed with a cardiac PET stress test for further evaluation. Given that her recent monitor had shown episodes of SVT, she was restarted on Metoprolol 12.5 mg twice daily.  In talking with the patient today, she denies any recurrent episodes of presyncope. Says that she has not really experienced palpitations but does have intermittent episodes of weakness. She does take her Metoprolol as 25 mg in the morning instead of taking an evening dose due to her blood pressure being softer at night. She reports occasional episodes of chest discomfort but this typically occurs when she presses along her sternal region and pain radiates to her shoulder at that time. No  association of pain with exertion.  No specific orthopnea or PND. She does have chronic lower extremity edema and remains on Torsemide 60 mg daily.   Studies Reviewed:   EKG: EKG is not ordered today.    Coronary CT: 06/2020 IMPRESSION: 1. Coronary calcium score of 94. This was 13 percentile for age and sex matched control.   2. Normal coronary origin with right dominance.   3. There is mid RCA calcified stenosis 50-74% (may be overestimated due to heavy calcification). Will send for CT-FFR analysis to assess for flow limitation. Await results.   4. There is mid LAD calcified stenosis 0-24% that is non flow limiting.   5. Thickened pericardium (pericardial fat).   IMPRESSION: 1. There is no flow limiting coronary artery disease by flow analysis.   2.  Recommend aggressive secondary prevention for calcified plaque.  Limited Echo: 12/2022 IMPRESSIONS     1. Left ventricular ejection fraction, by estimation, is 65 to 70%. The  left ventricle has normal function. Left ventricular diastolic parameters  are consistent with Grade I diastolic dysfunction (impaired relaxation).   2. A small pericardial effusion is present. The pericardial effusion is  lateral to the left ventricle, localized near the right atrium and  posterior to the left ventricle. There is no evidence of cardiac  tamponade.   3. The inferior vena cava is normal in size with greater than 50%  respiratory variability, suggesting right atrial pressure of 3 mmHg.   Comparison(s): No significant change from prior study. Stable pericardial  effusion.   Event  Monitor: 12/2022 The predominant rhythm was normal sinus rhythm at an average of 77 bpm. Slowest heart rate was sinus bradycardia at 57 bpm which occurred at 3:49 AM on 4/5 and the fastest sinus rhythm was 114 bpm which occurred at 2:40 PM on 4/13. There were frequent episodes of SVT totaling 116 with the fastest interval lasting 8 beats with an average  heart rate at 122 (range 68 - 174) and the longest interval lasting 3 minutes and 53 seconds with an average rate at 149 bpm. There was rare atrial and ventricular ectopy. There were no episodes of atrial fibrillation, heart block, or sinus pauses.    Physical Exam:   VS:  BP 102/62   Pulse 62   Ht 5\' 6"  (1.676 m)   Wt 213 lb (96.6 kg)   SpO2 97%   BMI 34.38 kg/m    Wt Readings from Last 3 Encounters:  04/02/23 213 lb (96.6 kg)  01/21/23 221 lb 3.2 oz (100.3 kg)  01/01/23 223 lb (101.2 kg)     GEN: Well nourished, well developed female appearing in no acute distress NECK: No JVD; No carotid bruits CARDIAC: RRR, no murmurs, rubs, gallops RESPIRATORY:  Clear to auscultation without rales, wheezing or rhonchi  ABDOMEN: Appears non-distended. No obvious abdominal masses. EXTREMITIES: No clubbing or cyanosis. Trace lower extremity edema.  Distal pedal pulses are 2+ bilaterally.   Assessment and Plan:   1. Recurrent Syncope - Previously felt to be secondary to dehydration as cardiac and neurologic workups were reassuring during admission. Echo showed a preserved EF with a small pericardial effusion which has been noted on prior imaging. Carotid dopplers showed no significant stenosis. Recent monitor as outlined above showed SVT but no significant pauses. She does have follow-up with EP next week and an ILR could be considered but by description, her episodes seem atypical for an arrhythmia. Cardiac PET pending as well.   2. SVT - Recent monitor showed frequent episodes of SVT as outlined above and she was previously referred to EP by Sara Furth, PA and has an upcoming visit next week. She is listed as taking Lopressor 12.5mg  BID but typically takes this as 25mg  once daily. Given that she prefers to avoid an evening dose due to fluctuations in her BP, will try switching to Toprol-XL 25mg  daily for sustained release.   3. CAD - Prior Coronary CT in 2021 showed 50-74% stenosis along the  RCA but was negative by FFR and perhaps over-estimated. A cardiac PET was ordered at the time of her last office visit and has not yet been scheduled (was initially delayed due to insurance but has now been approved). Will ask our scheduling team today to follow-up on this. At this time, her current symptoms seem atypical for angina.  - Continue ASA 81mg  daily and Atorvastatin 20mg  daily.    4. Lower Extremity Edema - Reports symptoms have overall been stable. Continue Torsemide 60mg  daily. Most recent labs on 03/30/2023 showed her creatinine was stable at 1.11 and K+ had normalized.   5. HLD - Followed by her PCP. She remains on Atorvastatin 20mg  daily.    Signed, Ellsworth Lennox, PA-C

## 2023-04-02 NOTE — Patient Instructions (Signed)
Medication Instructions:  STOP lopressor   START Toprol XL 25 mg daily   Labwork: None today  Testing/Procedures: Schedule cardiac pet Scan at [facility]  Follow-Up: 3-4 months with Dr.Kelly  Any Other Special Instructions Will Be Listed Below (If Applicable).  If you need a refill on your cardiac medications before your next appointment, please call your pharmacy.

## 2023-04-07 ENCOUNTER — Institutional Professional Consult (permissible substitution): Payer: Medicare Other | Admitting: Cardiology

## 2023-04-09 DIAGNOSIS — G4733 Obstructive sleep apnea (adult) (pediatric): Secondary | ICD-10-CM | POA: Diagnosis not present

## 2023-04-21 DIAGNOSIS — M5416 Radiculopathy, lumbar region: Secondary | ICD-10-CM | POA: Diagnosis not present

## 2023-04-22 ENCOUNTER — Ambulatory Visit: Payer: Medicare Other

## 2023-04-27 ENCOUNTER — Encounter (HOSPITAL_COMMUNITY): Payer: Self-pay

## 2023-04-27 DIAGNOSIS — C50911 Malignant neoplasm of unspecified site of right female breast: Secondary | ICD-10-CM | POA: Diagnosis not present

## 2023-04-29 ENCOUNTER — Ambulatory Visit: Payer: Medicare Other

## 2023-05-04 ENCOUNTER — Encounter: Payer: Self-pay | Admitting: Cardiology

## 2023-05-04 ENCOUNTER — Ambulatory Visit: Payer: Medicare Other | Attending: Cardiology | Admitting: Cardiology

## 2023-05-04 VITALS — BP 114/62 | HR 62 | Ht 66.0 in | Wt 217.0 lb

## 2023-05-04 DIAGNOSIS — I5020 Unspecified systolic (congestive) heart failure: Secondary | ICD-10-CM

## 2023-05-04 DIAGNOSIS — I2510 Atherosclerotic heart disease of native coronary artery without angina pectoris: Secondary | ICD-10-CM

## 2023-05-04 DIAGNOSIS — I471 Supraventricular tachycardia, unspecified: Secondary | ICD-10-CM

## 2023-05-04 DIAGNOSIS — R55 Syncope and collapse: Secondary | ICD-10-CM | POA: Diagnosis not present

## 2023-05-04 DIAGNOSIS — I5032 Chronic diastolic (congestive) heart failure: Secondary | ICD-10-CM

## 2023-05-04 DIAGNOSIS — R9431 Abnormal electrocardiogram [ECG] [EKG]: Secondary | ICD-10-CM | POA: Diagnosis not present

## 2023-05-04 NOTE — Patient Instructions (Signed)
Medication Instructions:  The current medical regimen is effective;  continue present plan and medications.  *If you need a refill on your cardiac medications before your next appointment, please call your pharmacy*   Testing/Procedures: Your physician has requested that you have a cardiac MRI. Cardiac MRI uses a computer to create images of your heart as its beating, producing both still and moving pictures of your heart and major blood vessels. For further information please visit [email or web address]. Please follow the instruction sheet given to you today for more information.    Follow-Up: At [facility], you and your health needs are our priority.  As part of our continuing mission to provide you with exceptional heart care, we have created designated Provider Care Teams.  These Care Teams include your primary Cardiologist (physician) and Advanced Practice Providers (APPs -  Physician Assistants and Nurse Practitioners) who all work together to provide you with the care you need, when you need it.  We recommend signing up for the patient portal called "MyChart".  Sign up information is provided on this After Visit Summary.  MyChart is used to connect with patients for Virtual Visits (Telemedicine).  Patients are able to view lab/test results, encounter notes, upcoming appointments, etc.  Non-urgent messages can be sent to your provider as well.   To learn more about what you can do with MyChart, go to [email or web address].    Your next appointment:   Keep scheduled with [facility]- will reach out for anything sooner, we will let you know.  Other Instructions    [facility] [address] [place], [place] [address] [phone] Please go to the [facility] and check-in with the desk attendant.   Magnetic resonance imaging (MRI) is a painless test that produces images of the inside of the body without using Xrays.  During an MRI, strong  magnets and radio waves work together in a Data processing manager to form detailed images.   MRI images may provide more details about a medical condition than X-rays, CT scans, and ultrasounds can provide.  You may be given earphones to listen for instructions.  You may eat a light breakfast and take medications as ordered with the exception of furosemide, hydrochlorothiazide, or spironolactone(fluid pill, other). Please avoid stimulants for 12 hr prior to test. (Ie. Caffeine, nicotine, chocolate, or antihistamine medications)  An IV will be inserted into one of your veins. Contrast material will be injected into your IV. It will leave your body through your urine within a day. You may be told to drink plenty of fluids to help flush the contrast material out of your system.  You will be asked to remove all metal, including: Watch, jewelry, and other metal objects including hearing aids, hair pieces and dentures. Also wearable glucose monitoring systems (ie. Freestyle Libre and Omnipods) (Braces and fillings normally are not a problem.)   TEST WILL TAKE APPROXIMATELY 1 HOUR  PLEASE NOTIFY SCHEDULING AT LEAST 24 HOURS IN ADVANCE IF YOU ARE UNABLE TO KEEP YOUR APPOINTMENT. [phone]  For more information and frequently asked questions, please visit our website : [email or web address]  Please call the Cardiac Imaging Nurse Navigators with any questions/concerns. [phone] Office

## 2023-05-04 NOTE — Progress Notes (Signed)
Electrophysiology Office Note:    Date:  05/04/2023   ID:  Sara [name], Sara [name] 1946/06/11, MRN [id number]  [facility] Cardiologist:  Nicki Guadalajara, MD  [facility] Electrophysiologist:  Lanier Prude, MD   Referring MD: Marianne Sofia, PA-C   Chief Complaint: Syncope  History of Present Illness:    Sara [name] is a 77 y.o. femalewho I am seeing today for an evaluation of syncope at the request of Cadence Donegal, [place].  The patient was last seen by Randall An on April 02, 2023.  The patient has a medical history that includes syncope, hypertension, hyperlipidemia, SVT, obstructive sleep apnea on CPAP, coronary artery disease.  She was previously admitted in April 2024 with a syncopal episode.  It was thought to be secondary to dehydration.  Workup was negative for other causes of syncope.  She has had follow-up over the last few months during which she is reported dyspnea with exertion.  She also reported several other presyncopal episodes.  ----------  Today she described her syncopal episode and presyncopal episodes to me in great detail.  She tells me that each time she has a sudden onset of a flushed sensation followed by diaphoresis.  She will frequently become nauseous.  She also has a urge to urinate or defecate.  There was 1 time when she had this syndrome while standing and she ultimately lost consciousness.  Other times she has had this occur while seated.  No clear triggers.  Each time, it takes several minutes for her to fully recover.  She denies ever having abrupt syncope.     Their past medical, social and family history was reveiwed.   ROS:   Please see the history of present illness.    All other systems reviewed and are negative.  EKGs/Labs/Other Studies Reviewed:    The following studies were reviewed today:  Feb 19, 2023 ZIO monitor Heart rate 57-174, average 77 116 SVT episodes, longest lasting 3 minutes and 53 seconds with an  average rate of 149 bpm.  Rare supraventricular and ventricular ectopy.  No pauses or evidence of heart block.  December 21, 2022 echo EF 65 to 70% Small pericardial effusion No significant valvular abnormalities  Jan 21, 2023 EKG shows sinus rhythm, no preexcitation, low voltage, no evidence of AV conduction disease  EKG Interpretation Date/Time:  Tuesday May 04 2023 15:54:35 EDT Ventricular Rate:  62 PR Interval:  154 QRS Duration:  74 QT Interval:  444 QTC Calculation: 450 R Axis:   -47  Text Interpretation: Normal sinus rhythm Possible Left atrial enlargement Confirmed by Steffanie Dunn [phone]) on 05/04/2023 4:05:21 PM    Physical Exam:    VS:  BP 114/62   Pulse 62   Ht 5\' 6"  (1.676 m)   Wt 217 lb (98.4 kg)   SpO2 95%   BMI 35.02 kg/m     Wt Readings from Last 3 Encounters:  05/04/23 217 lb (98.4 kg)  04/02/23 213 lb (96.6 kg)  01/21/23 221 lb 3.2 oz (100.3 kg)     GEN:  Well nourished, well developed in no acute distress.  Obese CARDIAC: RRR, no murmurs, rubs, gallops.  2+ pitting bilateral lower extremity edema to the mid shins RESPIRATORY:  Clear to auscultation without rales, wheezing or rhonchi       ASSESSMENT AND PLAN:    1. Syncope, unspecified syncope type   2. SVT (supraventricular tachycardia)   3. Coronary artery disease involving native coronary artery of native  heart without angina pectoris   4. Chronic diastolic heart failure (HCC)     #Recurrent syncope #Vagally mediated syncope Recurrent episodes of syncope.  Her syncopal episodes are very characteristic of vagally mediated syncope.  I do not suspect arrhythmic syncope.  I do not think loop recorder monitoring is indicated at this time.  I have encouraged her to take the prodromal symptoms seriously and get to the ground immediately should she experience some of the warning signs of feeling flushed or sweaty.   #Acute on chronic diastolic heart failure NYHA class II-III.  Warm and volume  overloaded on exam.  She drinks a significant amount of fluids/juice during the day which is likely limiting the impact of her torsemide.  I have encouraged her to limit her p.o. liquid intake and continue on the torsemide.  I am concerned that she could have an infiltrative cardiomyopathy such as amyloidosis given the very low voltage seen on her twelve-lead EKG.  I would like to get a cardiac MRI to further evaluate.  I will also try to get her into her general cardiologist's office in the next few weeks to further investigate. SPEP/UPEP ordered today.    Signed, Rossie Muskrat. Lalla Brothers, MD, [facility], [facility] 05/04/2023 9:18 PM    Electrophysiology [facility]

## 2023-05-19 ENCOUNTER — Encounter (HOSPITAL_COMMUNITY): Payer: Self-pay

## 2023-05-20 ENCOUNTER — Ambulatory Visit
Admission: RE | Admit: 2023-05-20 | Discharge: 2023-05-20 | Disposition: A | Discharge status: Home / Self Care | Payer: Medicare Other | Source: Ambulatory Visit | Attending: Nurse Practitioner | Admitting: Nurse Practitioner

## 2023-05-20 DIAGNOSIS — R55 Syncope and collapse: Secondary | ICD-10-CM | POA: Diagnosis not present

## 2023-05-20 DIAGNOSIS — Z87898 Personal history of other specified conditions: Secondary | ICD-10-CM | POA: Diagnosis not present

## 2023-05-20 DIAGNOSIS — I2510 Atherosclerotic heart disease of native coronary artery without angina pectoris: Secondary | ICD-10-CM | POA: Diagnosis not present

## 2023-05-20 DIAGNOSIS — Z0189 Encounter for other specified special examinations: Secondary | ICD-10-CM | POA: Diagnosis not present

## 2023-05-20 DIAGNOSIS — R0602 Shortness of breath: Secondary | ICD-10-CM | POA: Insufficient documentation

## 2023-05-20 MED ORDER — REGADENOSON 0.4 MG/5ML IV SOLN
INTRAVENOUS | Status: AC
  Filled 2023-05-20: qty 5

## 2023-05-20 MED ORDER — REGADENOSON 0.4 MG/5ML IV SOLN
0.4000 mg | Freq: Once | INTRAVENOUS | Status: AC
  Administered 2023-05-20: 0.4 mg via INTRAVENOUS
  Filled 2023-05-20: qty 5

## 2023-05-20 MED ORDER — RUBIDIUM RB82 GENERATOR (RUBYFILL)
25.0000 | PACK | Freq: Once | INTRAVENOUS | Status: AC
  Administered 2023-05-20: 24.96 via INTRAVENOUS

## 2023-05-20 MED ORDER — RUBIDIUM RB82 GENERATOR (RUBYFILL)
25.0000 | PACK | Freq: Once | INTRAVENOUS | Status: AC
  Administered 2023-05-20: 24.93 via INTRAVENOUS

## 2023-05-20 NOTE — Progress Notes (Signed)
Patient presents for a cardiac PET stress test and tolerated procedure without incident. Patient maintained acceptable vital signs throughout the test and was offered caffeine after test.  Patient ambulated out of department with a steady gait.  

## 2023-05-21 ENCOUNTER — Other Ambulatory Visit: Payer: Self-pay

## 2023-05-21 LAB — NM PET CT CARDIAC PERFUSION MULTI W/ABSOLUTE BLOODFLOW
MBFR: 2.05
Nuc Rest EF: 74 %
Nuc Stress EF: 76 %
Peak HR: 89 {beats}/min
Rest HR: 70 {beats}/min
Rest MBF: 0.97 ml/g/min
Rest Nuclear Isotope Dose: 25 mCi
SRS: 0
SSS: 0
Stress MBF: 1.99 ml/g/min
Stress Nuclear Isotope Dose: 24.9 mCi
TID: 1.04

## 2023-05-21 MED ORDER — METOPROLOL SUCCINATE ER 25 MG PO TB24: 25.0000 mg | ORAL_TABLET | Freq: Every day | ORAL | 1 refills | Status: DC

## 2023-05-25 DIAGNOSIS — R600 Localized edema: Secondary | ICD-10-CM | POA: Diagnosis not present

## 2023-05-25 DIAGNOSIS — E039 Hypothyroidism, unspecified: Secondary | ICD-10-CM | POA: Diagnosis not present

## 2023-05-25 DIAGNOSIS — N3281 Overactive bladder: Secondary | ICD-10-CM | POA: Diagnosis not present

## 2023-05-25 DIAGNOSIS — N1831 Chronic kidney disease, stage 3a: Secondary | ICD-10-CM | POA: Diagnosis not present

## 2023-05-25 DIAGNOSIS — I10 Essential (primary) hypertension: Secondary | ICD-10-CM | POA: Diagnosis not present

## 2023-05-25 DIAGNOSIS — I2510 Atherosclerotic heart disease of native coronary artery without angina pectoris: Secondary | ICD-10-CM | POA: Diagnosis not present

## 2023-05-25 DIAGNOSIS — R945 Abnormal results of liver function studies: Secondary | ICD-10-CM | POA: Diagnosis not present

## 2023-05-26 ENCOUNTER — Telehealth: Payer: Self-pay

## 2023-05-26 NOTE — Telephone Encounter (Signed)
Spoke with pt. Pt was notified of Cardiac PET Stress trest. Pt will continue current medication and f/u as planned. Pt voiced understanding.

## 2023-07-01 ENCOUNTER — Other Ambulatory Visit: Payer: Self-pay | Admitting: Orthopedic Surgery

## 2023-07-09 ENCOUNTER — Ambulatory Visit (HOSPITAL_COMMUNITY)
Admission: RE | Admit: 2023-07-09 | Discharge: 2023-07-09 | Disposition: A | Discharge status: Home / Self Care | Payer: Medicare Other | Source: Ambulatory Visit | Attending: Physician Assistant | Admitting: Physician Assistant

## 2023-07-09 DIAGNOSIS — I3139 Other pericardial effusion (noninflammatory): Secondary | ICD-10-CM | POA: Diagnosis not present

## 2023-07-09 DIAGNOSIS — R599 Enlarged lymph nodes, unspecified: Secondary | ICD-10-CM | POA: Diagnosis not present

## 2023-07-09 DIAGNOSIS — R590 Localized enlarged lymph nodes: Secondary | ICD-10-CM | POA: Insufficient documentation

## 2023-07-09 DIAGNOSIS — I700 Atherosclerosis of aorta: Secondary | ICD-10-CM | POA: Diagnosis not present

## 2023-07-12 DIAGNOSIS — N1831 Chronic kidney disease, stage 3a: Secondary | ICD-10-CM | POA: Diagnosis not present

## 2023-07-12 DIAGNOSIS — R8290 Unspecified abnormal findings in urine: Secondary | ICD-10-CM | POA: Diagnosis not present

## 2023-07-12 DIAGNOSIS — I10 Essential (primary) hypertension: Secondary | ICD-10-CM | POA: Diagnosis not present

## 2023-07-12 DIAGNOSIS — E785 Hyperlipidemia, unspecified: Secondary | ICD-10-CM | POA: Diagnosis not present

## 2023-07-12 DIAGNOSIS — E876 Hypokalemia: Secondary | ICD-10-CM | POA: Diagnosis not present

## 2023-07-12 DIAGNOSIS — R809 Proteinuria, unspecified: Secondary | ICD-10-CM | POA: Diagnosis not present

## 2023-07-14 ENCOUNTER — Other Ambulatory Visit (HOSPITAL_COMMUNITY): Payer: Self-pay | Admitting: Nephrology

## 2023-07-14 DIAGNOSIS — R8290 Unspecified abnormal findings in urine: Secondary | ICD-10-CM

## 2023-07-14 DIAGNOSIS — N1831 Chronic kidney disease, stage 3a: Secondary | ICD-10-CM

## 2023-07-20 ENCOUNTER — Ambulatory Visit (HOSPITAL_COMMUNITY)
Admission: RE | Admit: 2023-07-20 | Discharge: 2023-07-20 | Disposition: A | Discharge status: Home / Self Care | Payer: Medicare Other | Source: Ambulatory Visit | Attending: Nephrology | Admitting: Nephrology

## 2023-07-20 DIAGNOSIS — E039 Hypothyroidism, unspecified: Secondary | ICD-10-CM | POA: Diagnosis not present

## 2023-07-20 DIAGNOSIS — N1831 Chronic kidney disease, stage 3a: Secondary | ICD-10-CM | POA: Diagnosis not present

## 2023-07-20 DIAGNOSIS — I2510 Atherosclerotic heart disease of native coronary artery without angina pectoris: Secondary | ICD-10-CM | POA: Diagnosis not present

## 2023-07-20 DIAGNOSIS — G4733 Obstructive sleep apnea (adult) (pediatric): Secondary | ICD-10-CM | POA: Diagnosis not present

## 2023-07-20 DIAGNOSIS — R600 Localized edema: Secondary | ICD-10-CM | POA: Diagnosis not present

## 2023-07-20 DIAGNOSIS — N189 Chronic kidney disease, unspecified: Secondary | ICD-10-CM | POA: Diagnosis not present

## 2023-07-20 DIAGNOSIS — R8290 Unspecified abnormal findings in urine: Secondary | ICD-10-CM | POA: Insufficient documentation

## 2023-07-20 DIAGNOSIS — R42 Dizziness and giddiness: Secondary | ICD-10-CM | POA: Diagnosis not present

## 2023-07-20 DIAGNOSIS — I1310 Hypertensive heart and chronic kidney disease without heart failure, with stage 1 through stage 4 chronic kidney disease, or unspecified chronic kidney disease: Secondary | ICD-10-CM | POA: Diagnosis not present

## 2023-07-20 DIAGNOSIS — I10 Essential (primary) hypertension: Secondary | ICD-10-CM | POA: Diagnosis not present

## 2023-07-27 ENCOUNTER — Encounter (HOSPITAL_COMMUNITY): Payer: Self-pay

## 2023-07-28 ENCOUNTER — Other Ambulatory Visit: Payer: Self-pay | Admitting: Cardiology

## 2023-07-28 ENCOUNTER — Ambulatory Visit
Admission: RE | Admit: 2023-07-28 | Discharge: 2023-07-28 | Disposition: A | Discharge status: Home / Self Care | Payer: Medicare Other | Source: Ambulatory Visit | Attending: Cardiology | Admitting: Cardiology

## 2023-07-28 DIAGNOSIS — I5030 Unspecified diastolic (congestive) heart failure: Secondary | ICD-10-CM

## 2023-07-28 DIAGNOSIS — I5032 Chronic diastolic (congestive) heart failure: Secondary | ICD-10-CM | POA: Insufficient documentation

## 2023-07-28 MED ORDER — GADOBUTROL 1 MMOL/ML IV SOLN
13.0000 mL | Freq: Once | INTRAVENOUS | Status: AC | PRN
  Administered 2023-07-28: 13 mL via INTRAVENOUS

## 2023-07-30 ENCOUNTER — Other Ambulatory Visit: Payer: Self-pay | Admitting: Student

## 2023-08-10 ENCOUNTER — Ambulatory Visit: Payer: Medicare Other | Admitting: Cardiovascular Disease

## 2023-09-06 DIAGNOSIS — E876 Hypokalemia: Secondary | ICD-10-CM | POA: Diagnosis not present

## 2023-09-06 DIAGNOSIS — R809 Proteinuria, unspecified: Secondary | ICD-10-CM | POA: Diagnosis not present

## 2023-09-06 DIAGNOSIS — I10 Essential (primary) hypertension: Secondary | ICD-10-CM | POA: Diagnosis not present

## 2023-09-06 DIAGNOSIS — E785 Hyperlipidemia, unspecified: Secondary | ICD-10-CM | POA: Diagnosis not present

## 2023-09-06 DIAGNOSIS — N1831 Chronic kidney disease, stage 3a: Secondary | ICD-10-CM | POA: Diagnosis not present

## 2023-09-07 ENCOUNTER — Telehealth: Payer: Self-pay | Admitting: Cardiovascular Disease

## 2023-09-07 NOTE — Telephone Encounter (Signed)
Per patient schedule message:  Patient requested an appointment mentioning chest pain and palpitations. When I asked the prompted questions and this was patient's response.  "I am having chest pains and heart palpitations.   Not emergent symptoms. The pains do not linger. They are like a hard pinch. Shortness of breath and lightheadedness are intermittent. The palpitations a when they come feel like my heart will come up out of mouth. My heart rate and pulse 111/74 .Marland KitchenMarland [id number]."

## 2023-09-07 NOTE — Telephone Encounter (Signed)
Called and spoke to patient. Verified name and DOB. She report she's been having chest pain and palpitations that comes and goes for 2 weeks. She stated the pain is like a pinching feeling. She is also having pain under her left shoulder blade SOB, dizziness, swelling in both legs and feet. She deny symptoms at this time and the pain is not daily. She also said she had a fall the week before Thanksgiving. Her current weight is 215 lb, BP 111/74 HR 63. Patient was scheduled for an office visit 1/3 with Bernadene Person, NP at 10:55. Patient was advised if symptoms worsen or new symptoms develop to go to the [facility] for evaluation. Patient verbalized understanding and agree.

## 2023-09-14 NOTE — Telephone Encounter (Signed)
Patient has been scheduled for an office visit 1/3 with Bernadene Person, NP at 10:55. Patient was advised if symptoms worsen or new symptoms develop to go to the [facility] for evaluation. Patient verbalized understanding and agree.

## 2023-09-16 ENCOUNTER — Ambulatory Visit: Payer: Medicare Other | Admitting: Student

## 2023-09-20 NOTE — Telephone Encounter (Signed)
Acknowledged.

## 2023-09-24 ENCOUNTER — Ambulatory Visit: Payer: Medicare Other | Admitting: Nurse Practitioner

## 2023-09-27 ENCOUNTER — Encounter: Payer: Self-pay | Admitting: Nurse Practitioner

## 2023-09-27 ENCOUNTER — Ambulatory Visit: Payer: Self-pay | Attending: Nurse Practitioner | Admitting: Nurse Practitioner

## 2023-09-27 VITALS — BP 115/68 | HR 80 | Ht 66.0 in | Wt 206.6 lb

## 2023-09-27 DIAGNOSIS — G4733 Obstructive sleep apnea (adult) (pediatric): Secondary | ICD-10-CM

## 2023-09-27 DIAGNOSIS — Z87898 Personal history of other specified conditions: Secondary | ICD-10-CM | POA: Diagnosis not present

## 2023-09-27 DIAGNOSIS — R002 Palpitations: Secondary | ICD-10-CM | POA: Diagnosis not present

## 2023-09-27 DIAGNOSIS — E785 Hyperlipidemia, unspecified: Secondary | ICD-10-CM | POA: Diagnosis not present

## 2023-09-27 DIAGNOSIS — R600 Localized edema: Secondary | ICD-10-CM | POA: Diagnosis not present

## 2023-09-27 DIAGNOSIS — R0602 Shortness of breath: Secondary | ICD-10-CM | POA: Diagnosis not present

## 2023-09-27 DIAGNOSIS — I2510 Atherosclerotic heart disease of native coronary artery without angina pectoris: Secondary | ICD-10-CM | POA: Diagnosis not present

## 2023-09-27 DIAGNOSIS — I10 Essential (primary) hypertension: Secondary | ICD-10-CM

## 2023-09-27 DIAGNOSIS — I471 Supraventricular tachycardia, unspecified: Secondary | ICD-10-CM | POA: Diagnosis not present

## 2023-09-27 MED ORDER — METOPROLOL SUCCINATE ER 25 MG PO TB24: 37.5000 mg | ORAL_TABLET | Freq: Every day | ORAL | 3 refills | Status: DC

## 2023-09-27 NOTE — Progress Notes (Signed)
 Office Visit    Patient Name: LULANI BOUR Date of Encounter: 09/27/2023  Primary Care Provider:  [name], MD Primary Cardiologist:  Debby Sor, MD  Chief Complaint    78 year old female with a history of CAD, SVT, syncope, bilateral lower extremity edema, hypertension, hyperlipidemia, OSA and CKD stage IIIa s/p RCC with partial nephrectomy, who presents for follow-up related to chest pain and palpitations.   Past Medical History    Past Medical History:  Diagnosis Date   Allergic rhinitis due to pollen    Anxiety    Arthritis    knees, back, right elbow; left shoulder; right ankle (11/20/2014)   Breast cancer, right breast (HCC) 2016   DCIS; zero stage S/P mastectomy, cancer free since then   Cervical spondylosis without myelopathy    Cervicalgia    Chronic lower back pain    Constipation    takes Miralax  daily   Degeneration of cervical intervertebral disc    Depression    Diverticulosis of colon (without mention of hemorrhage)    GERD (gastroesophageal reflux disease)    takes Omeprazole daily   Graves' disease    took a pill to correct   H/O hiatal hernia    H/O urinary frequency    Headache(784.0)    denies migraines since the 80's but has occ and takes Butalbital prn   HTN (hypertension)    takes Metoprolol  and Lisinopril  daily   Insomnia    takes Ativan  nigtly   Joint pain    all of them   Joint swelling    knees, legs, ankles sometimes (11/20/2014)   Morbid obesity (HCC)    Numbness    LOWER LEGS   Other postablative hypothyroidism    takes SYnthroid  daily   Palpitations    Peptic ulcer, unspecified site, unspecified as acute or chronic, without mention of hemorrhage, perforation, or obstruction    Primary localized osteoarthrosis, lower leg    Pure hypercholesterolemia    takes Pravastin daily   Recurrent UTI    Renal cell carcinoma (HCC) 01/08/2016   Right renal mass s/p partial right nephrectomy, cancer free since 2017    Shortness of breath    Sleep apnea    slight but doesn't require a CPAP (11/20/2014)   Tendonitis of knee    bilateral   Past Surgical History:  Procedure Laterality Date   ABDOMINAL HYSTERECTOMY     ANTERIOR LAT LUMBAR FUSION  05/13/2012   Procedure: ANTERIOR LATERAL LUMBAR FUSION 2 LEVELS;  Surgeon: Darina MALVA Boehringer, MD;  Location: [facility];  Service: Neurosurgery;  Laterality: Left;  Left Lumbar Three-four,Lumbar four-five Extreme Lumbar Interbody Fusion with Percutaneous Pedicle Screws   APPENDECTOMY     BREAST BIOPSY Left ~ 2014   BREAST BIOPSY Right 2015   BREAST LUMPECTOMY Left 1988   BREAST RECONSTRUCTION WITH PLACEMENT OF TISSUE EXPANDER AND FLEX HD (ACELLULAR HYDRATED DERMIS) Right 11/20/2014   BREAST RECONSTRUCTION WITH PLACEMENT OF TISSUE EXPANDER AND FLEX HD (ACELLULAR HYDRATED DERMIS) Right 11/20/2014   Procedure: RIGHT BREAST RECONSTRUCTION WITH PLACEMENT OF TISSUE EXPANDER AND ACELLULAR DERMA MATRIX (ACELLULAR DERMA MATRIX);  Surgeon: Alm Sick, MD;  Location: [facility] OR;  Service: Plastics;  Laterality: Right;   CATARACT EXTRACTION W/ INTRAOCULAR LENS  IMPLANT, BILATERAL Bilateral    COLONOSCOPY  Sept 2008   RMR: normal rectum, left-sided diverticula, repeat in 2018   COLONOSCOPY WITH PROPOFOL  N/A 03/21/2018   Dr. Shaaron: diverticulosis, internal grade I hemorrhoids. no future colonoscopies unless develops new  symptoms   DIAGNOSTIC LAPAROSCOPY     DILATION AND CURETTAGE OF UTERUS     ESOPHAGOGASTRODUODENOSCOPY     ESOPHAGOGASTRODUODENOSCOPY  2013   Dr. Shaaron: Cervical esophageal web and Schatzki's ring s/p dilation, small hiatal hernia, antral and duodenal erosions likely NSAID effect, chronic duodenitis on path    ESOPHAGOGASTRODUODENOSCOPY (EGD) WITH PROPOFOL  N/A 03/21/2018   Dr. Shaaron: mild Schatzki ring s/p dilation   FOREIGN BODY REMOVAL Right 10/06/2013   Procedure: REMOVAL FOREIGN BODY EXTREMITY;  Surgeon: Taft FORBES Minerva, MD;  Location: [facility];  Service: Orthopedics;   Laterality: Right;   INJECTION KNEE Left 10/06/2013   Procedure: KNEE INJECTION;  Surgeon: Taft FORBES Minerva, MD;  Location: [facility];  Service: Orthopedics;  Laterality: Left;   JOINT REPLACEMENT Bilateral    knees   KNEE ARTHROSCOPY Right    KNEE ARTHROSCOPY WITH LATERAL MENISECTOMY Right 10/06/2013   Procedure: KNEE ARTHROSCOPY WITH LATERAL AND MEDIAL MENISECTOMY;  Surgeon: Taft FORBES Minerva, MD;  Location: [facility];  Service: Orthopedics;  Laterality: Right;  END @ 1234   lumpectomy on pelvis     mass of nerves   MALONEY DILATION N/A 03/21/2018   Procedure: MALONEY DILATION;  Surgeon: Shaaron Lamar HERO, MD;  Location: [facility];  Service: Endoscopy;  Laterality: N/A;   MASTECTOMY COMPLETE / SIMPLE W/ SENTINEL NODE BIOPSY Right 11/20/2014   axillary   REMOVAL OF TISSUE EXPANDER AND PLACEMENT OF IMPLANT Right 12/13/2014   Procedure: REMOVAL OF RIGHT BREAST TISSUE EXPANDER;  Surgeon: Alm Sick, MD;  Location: [facility] OR;  Service: Plastics;  Laterality: Right;   ROBOTIC ASSITED PARTIAL NEPHRECTOMY Right 01/08/2016   Procedure: XI ROBOTIC ASSITED PARTIAL NEPHRECTOMY;  Surgeon: Ricardo Likens, MD;  Location: [facility];  Service: Urology;  Laterality: Right;   SIMPLE MASTECTOMY WITH AXILLARY SENTINEL NODE BIOPSY Right 11/20/2014   Procedure: SIMPLE MASTECTOMY WITH AXILLARY SENTINEL NODE BIOPSY;  Surgeon: Debby Shipper, MD;  Location: [facility] OR;  Service: General;  Laterality: Right;   TISSUE EXPANDER REMOVAL Right 12/13/2014   dr sick   TONSILLECTOMY     TOTAL KNEE ARTHROPLASTY Right 01/24/2014   Procedure: TOTAL KNEE ARTHROPLASTY;  Surgeon: Taft FORBES Minerva, MD;  Location: [facility];  Service: Orthopedics;  Laterality: Right;   TOTAL KNEE ARTHROPLASTY Left 08/08/2014   Procedure: TOTAL KNEE ARTHROPLASTY;  Surgeon: Taft FORBES Minerva, MD;  Location: [facility];  Service: Orthopedics;  Laterality: Left;   TUBAL LIGATION     UPPER GASTROINTESTINAL ENDOSCOPY      Allergies  Allergies  Allergen Reactions    Propoxyphene Hcl Other (See Comments)     Hallucinations   Pollen Extract Other (See Comments)    Sneezing, congestion   Camphor Itching   Dust Mite Extract Itching    Sneezing. Runny nose    Latex Dermatitis and Rash   Molds & Smuts Itching   Tomato Hives     Labs/Other Studies Reviewed    The following studies were reviewed today:  Cardiac Studies & Procedures     STRESS TESTS  NM PET CT CARDIAC PERFUSION MULTI W/ABSOLUTE BLOODFLOW 05/20/2023  Narrative   The study is normal. The study is low risk.   EKG was not recorded during study   LV perfusion is normal. There is no evidence of ischemia. There is no evidence of infarction.   Rest left ventricular function is normal. Rest EF: 74%. Stress left ventricular function is normal. Stress EF: 76%. End diastolic cavity size is normal. End systolic cavity size  is normal.   Myocardial blood flow was computed to be 0.16ml/g/min at rest and 1.60ml/g/min at stress. Global myocardial blood flow reserve was 2.05 and was normal.   Coronary calcium  was present on the attenuation correction CT images. Mild coronary calcifications were present. Coronary calcifications were present in the left anterior descending artery and right coronary artery distribution(s).   Electronically signed by Lonni Nanas, MD  EXAM: OVER-READ INTERPRETATION  PET-CT CHEST  The following report is an over-read performed by radiologist Dr. Camellia Lang [facility] on 05/20/2023. This over-read does not include interpretation of cardiac or coronary anatomy or pathology. The cardiac PET and cardiac CT interpretation by the cardiologist is to be attached.  COMPARISON:  Chest CTA 12/22/2022  FINDINGS: No evidence for lymphadenopathy within the visualized mediastinum or hilar regions.  The visualized lung parenchyma shows no suspicious pulmonary nodule or mass. No focal airspace consolidation. No effusion.  Visualized portions of the  upper abdomen are unremarkable.  No suspicious lytic or sclerotic osseous abnormality.  IMPRESSION: No acute or clinically significant extracardiac findings.   Electronically Signed By: Camellia Candle M.D. On: 05/20/2023 16:24  ECHOCARDIOGRAM  ECHOCARDIOGRAM LIMITED 12/21/2022  Narrative ECHOCARDIOGRAM LIMITED REPORT    Patient Name:   ANAAYA FUSTER Date of Exam: 12/21/2022 Medical Rec #:  [id number]         Height:       66.0 in Accession #:    [id number]        Weight:       222.2 lb Date of Birth:  1945-10-11         BSA:          2.091 m Patient Age:    76 years          BP:           97/51 mmHg Patient Gender: F                 HR:           66 bpm. Exam Location:  [facility]  Procedure: Limited Echo, Cardiac Doppler and Limited Color Doppler  Indications:    Syncope R55  History:        Patient has prior history of Echocardiogram examinations, most recent 11/18/2022. Signs/Symptoms:Syncope; Risk Factors:Hypertension, Dyslipidemia, Former Smoker and Sleep Apnea.  Sonographer:    Madeline Finder Referring Phys: [id number] ASIA B ZIERLE-GHOSH   Sonographer Comments: Image acquisition challenging due to respiratory motion and Image acquisition challenging due to patient body habitus. IMPRESSIONS   1. Left ventricular ejection fraction, by estimation, is 65 to 70%. The left ventricle has normal function. Left ventricular diastolic parameters are consistent with Grade I diastolic dysfunction (impaired relaxation). 2. A small pericardial effusion is present. The pericardial effusion is lateral to the left ventricle, localized near the right atrium and posterior to the left ventricle. There is no evidence of cardiac tamponade. 3. The inferior vena cava is normal in size with greater than 50% respiratory variability, suggesting right atrial pressure of 3 mmHg.  Comparison(s): No significant change from prior study. Stable pericardial effusion.  FINDINGS Left Ventricle: Left  ventricular ejection fraction, by estimation, is 65 to 70%. The left ventricle has normal function. The left ventricular internal cavity size was normal in size. There is no left ventricular hypertrophy. Left ventricular diastolic parameters are consistent with Grade I diastolic dysfunction (impaired relaxation).  Right Ventricle: The right ventricular size is not well visualized. Right vetricular wall thickness was  not well visualized. Right ventricular systolic function was not well visualized.  Left Atrium: Left atrial size was normal in size.  Right Atrium: Right atrial size was normal in size.  Pericardium: A small pericardial effusion is present. The pericardial effusion is lateral to the left ventricle, localized near the right atrium and posterior to the left ventricle. There is no evidence of cardiac tamponade.  Mitral Valve: The mitral valve is normal in structure.  Aortic Valve: The aortic valve was not well visualized. Aortic valve regurgitation is not visualized.  Venous: The inferior vena cava is normal in size with greater than 50% respiratory variability, suggesting right atrial pressure of 3 mmHg.  IAS/Shunts: The interatrial septum was not well visualized.  Additional Comments: Spectral Doppler performed. Color Doppler performed.  LEFT VENTRICLE PLAX 2D LVIDd:         3.40 cm LVIDs:         2.00 cm LV PW:         0.90 cm LV IVS:        0.80 cm   LEFT ATRIUM         Index LA diam:    2.40 cm 1.15 cm/m MITRAL VALVE               TRICUSPID VALVE MV Area (PHT): 2.60 cm    TR Peak grad:   23.6 mmHg MV Decel Time: 292 msec    TR Vmax:        243.00 cm/s MV E velocity: 80.40 cm/s MV A velocity: 99.50 cm/s MV E/A ratio:  0.81  Vishnu Priya Mallipeddi Electronically signed by Diannah Late Mallipeddi Signature Date/Time: 12/21/2022/1:04:58 PM    Final   MONITORS  LONG TERM MONITOR (3-14 DAYS) 01/18/2023  Narrative Patch Wear Time:  13 days and 23 hours  (2024-04-04T18:54:51-0400 to 2024-04-18T18:54:47-0400)  Patient had a min HR of 57 bpm, max HR of 174 bpm, and avg HR of 77 bpm. Predominant underlying rhythm was Sinus Rhythm. Slight P wave morphology changes were noted. 116 Supraventricular Tachycardia runs occurred, the run with the fastest interval lasting 8 beats with a max rate of 174 bpm, the longest lasting 3 mins 53 secs with an avg rate of 149 bpm. True duration of Supraventricular Tachycardia difficult to ascertain due to artifact. Isolated SVEs were rare (<1.0%), SVE Couplets were rare (<1.0%), and SVE Triplets were rare (<1.0%). Isolated VEs were rare (<1.0%), and no VE Couplets or VE Triplets were present. Ventricular Bigeminy was present.  The predominant rhythm was normal sinus rhythm at an average of 77 bpm.  Slowest heart rate was sinus bradycardia at 57 bpm which occurred at 3:49 AM on 4/5 and the fastest sinus rhythm was 114 bpm which occurred at 2:40 PM on 4/13.  There were frequent episodes of SVT totaling 116 with the fastest interval lasting 8 beats with an average heart rate at 122 (range 68 - 174) and the longest interval lasting 3 minutes and 53 seconds with an average rate at 149 bpm.  There was rare atrial and ventricular ectopy.  There were no episodes of atrial fibrillation, heart block, or sinus pauses.  CT SCANS  CT CORONARY FRACTIONAL FLOW RESERVE DATA PREP 03/05/2020  Narrative EXAM: FFRCT ANALYSIS on 78 year old female with chest pain and abnormal CT coronary.  FINDINGS: FFRct analysis was performed on the original cardiac CT angiogram dataset. Diagrammatic representation of the FFRct analysis is provided in a separate PDF document in PACS. This dictation was created  using the PDF document and an interactive 3D model of the results. 3D model is not available in the EMR/PACS. Normal FFR range is >0.80.  1. Left Main: 0.98  2. LAD:0.97, 0.95, 0.88  3. LCX: 0.97  4. Ramus: 0.97, 0.94, 0.93  5. RCA:  0.98, 0.92, 0.89  IMPRESSION: 1. There is no flow limiting coronary artery disease by flow analysis.  2.  Recommend aggressive secondary prevention for calcified plaque.  Oneil Parchment, MD [facility].  Note: These examples are not recommendations of [employer] and only provided as examples of what other customers are doing.   Electronically Signed By: Oneil Parchment MD On: 03/06/2020 06:34   CT CORONARY MORPH W/CTA COR W/SCORE 03/05/2020  Addendum 03/05/2020  6:19 PM ADDENDUM REPORT: 03/05/2020 18:12  CLINICAL DATA:  78 year old with chest pain.  EXAM: Cardiac/Coronary  CTA  TECHNIQUE: The patient was scanned on a [employer].  FINDINGS: A 110 kV prospective scan was triggered in the descending thoracic aorta at 111 HU's. Axial non-contrast 3 mm slices were carried out through the heart. The data set was analyzed on a dedicated work station and scored using the Agatson method. Gantry rotation speed was 250 msecs and collimation was .6 mm. No beta blockade and 0.8 mg of sl NTG was given. The 3D data set was reconstructed in 5% intervals of the 67-82 % of the R-R cycle. Diastolic phases were analyzed on a dedicated work station using MPR, MIP and VRT modes. The patient received 80 cc of contrast.  Aorta:  Normal size.  No calcifications.  No dissection.  Aortic Valve:  Trileaflet.  No calcifications.  Coronary Arteries:  Normal coronary origin.  Right dominance.  RCA is a large dominant artery that gives rise to PDA and PLA. There is mid RCA calcified stenosis 50-74% (may be overestimated due to heavy calcification).  Left main is a large artery that gives rise to LAD and LCX arteries.  There is mid LAD calcified stenosis 0-24% that is non flow limiting.  LCX is a non-dominant artery that gives rise to one large OM1 branch. There is a ramus branch. There is no plaque.  Other findings:  Normal pulmonary vein drainage into the left atrium.  Normal left atrial  appendage without a thrombus.  Normal size of the pulmonary artery.  Thickened pericardium (pericardial fat).  Please see radiology report for non cardiac findings.  IMPRESSION: 1. Coronary calcium  score of 94. This was 65 percentile for age and sex matched control.  2. Normal coronary origin with right dominance.  3. There is mid RCA calcified stenosis 50-74% (may be overestimated due to heavy calcification). Will send for CT-FFR analysis to assess for flow limitation. Await results.  4. There is mid LAD calcified stenosis 0-24% that is non flow limiting.  5. Thickened pericardium (pericardial fat).  Oneil Parchment, MD [facility]   Electronically Signed By: Oneil Parchment MD On: 03/05/2020 18:12  Narrative EXAM: OVER-READ INTERPRETATION  CT CHEST  The following report is an over-read performed by radiologist Dr. Rockey Kilts of [facility] on 03/05/2020. This over-read does not include interpretation of cardiac or coronary anatomy or pathology. The coronary CTA interpretation by the cardiologist is attached.  COMPARISON:  03/04/2020 chest radiograph.  FINDINGS: Vascular: Aortic atherosclerosis. No central pulmonary embolism, on this non-dedicated study.  Mediastinum/Nodes: No imaged thoracic adenopathy. Fluid in the esophagus including on 01/10.  Lungs/Pleura: No pleural fluid.  Clear imaged lungs.  Upper Abdomen: Normal imaged portions  of the liver, spleen, stomach.  Musculoskeletal: Right mastectomy. No acute osseous abnormality.  IMPRESSION: 1. No acute findings in the imaged extracardiac chest. 2. Esophageal fluid suggests dysmotility or gastroesophageal reflux. 3. Aortic Atherosclerosis (ICD10-I70.0).  Electronically Signed: By: Rockey Kilts M.D. On: 03/05/2020 17:57  CARDIAC MRI  MR CARDIAC MORPHOLOGY W WO CONTRAST 07/28/2023  Narrative CLINICAL DATA:  HFpEF. Evaluate for infiltrative cardiac disease/amyloidosis  EXAM: CARDIAC  MRI  TECHNIQUE: The patient was scanned on a 1.5 Tesla Siemens magnet. A dedicated cardiac coil was used. Functional imaging was done using Fiesta sequences. 2,3, and 4 chamber views were done to assess for RWMA's. Modified Simpson's rule using a short axis stack was used to calculate an ejection fraction on a dedicated work [employer]. The patient received 13 cc of Gadavist . After 10 minutes inversion recovery sequences were used to assess for infiltration and scar tissue. Velocity flow mapping performed in the ascending aorta and main pulmonary artery.  CONTRAST:  13 cc  of Gadavist   FINDINGS: 1. Normal left ventricular size, thickness and systolic function (LVEF = 64%). There are no regional wall motion abnormalities.  There is no late gadolinium enhancement in the left ventricular myocardium.  LVEDV: 91 ml  LVESV: 33 ml  SV: 58 ml  CO: 3.3 L/min  Myocardial mass: 63 g  LV native T1 1008 ms.  (normal <1000 ms)  LV ECV value 25% .  (Normal <30%).  2. Normal right ventricular size, thickness and systolic function (RVEF =69%). There are no regional wall motion abnormalities.  3.  Normal left and right atrial size.  4. Normal size of the aortic root, ascending aorta and pulmonary artery.  5. Mild tricuspid regurgitation, no significant valvular abnormalities.  6.  Normal pericardium.  Small circumferential pericardial effusion.  IMPRESSION: 1.  Normal LV size and systolic function.  LVEF 64%.  2.  No LV LGE or scar.  3.  Normal LV ECV, no evidence for myocardial infiltration.  4.  Normal RV size and systolic function.  5   Small pericardial effusion.  6.  No significant valvular abnormalities.   Electronically Signed By: Redell Cave M.D. On: 07/28/2023 17:13        Recent Labs: 10/27/2022: BNP 33.0 12/21/2022: ALT 25 01/14/2023: BUN 12; Creatinine, Ser 1.17; Hemoglobin 14.1; Magnesium  2.3; Platelets 241; Potassium 3.2; Sodium  139  Recent Lipid Panel    Component Value Date/Time   CHOL 141 06/07/2020 0833   TRIG 71 06/07/2020 0833   HDL 57 06/07/2020 0833   CHOLHDL 2.5 06/07/2020 0833   VLDL 14 03/06/2020 0420   LDLCALC 69 06/07/2020 0833    History of Present Illness    78 year old female with the above past medical history including CAD, SVT, syncope, bilateral lower extremity edema, hypertension, hyperlipidemia, OSA and CKD stage IIIa s/p RCC with partial nephrectomy.   Coronary CT angiogram in 2021 showed 50 to 74% stenosis along the mid RCA, normal FFR, minimal plaque elsewhere.  History of bilateral lower extremity torsemide .  Echocardiogram in 10/2022 showed EF 70 to 25%, no RWMA, no significant valvular abnormalities.  She was hospitalized from in 12/2022 in the setting of syncope.  Orthostatics were negative, telemetry did not show any significant arrhythmia.  Limited echocardiogram showed preserved EF 65 to 70%, no RWMA, G1 DD, small pericardial effusion.  Carotid Doppler showed no significant stenosis.  Spironolactone  was discontinued in the setting of AKI.  Metoprolol  was also discontinued in the setting of hypotension.  During her workup, she was noted to have a prominent right axillary lymph node measuring 9 mm.  Outpatient follow-up with oncology was recommended.  Outpatient monitor revealed predominantly sinus rhythm, frequent runs of SVT, rare PACs and PVCs.  She was referred to EP.  Low-dose metoprolol  was resumed.  She was seen in the ED on 01/15/2023 in the setting of near syncope.  Labs were unremarkable.  She was noted to be mildly hypokalemic.  She was started on potassium supplementation.  Cardiac PET stress test in 04/2023 was low risk, no evidence of ischemia or infarction, EF 74%.  She was last seen in the office on 05/04/2023 by EP, it was felt that her syncope was likely vagally mediated.  Loop recorder monitoring was deferred.  She was volume overloaded on exam.  She was advised to limit her fluid  intake and continue torsemide .  Given low voltage seen on EKG, concern for infiltrative cardiomyopathy, she underwent cardiac MRI in 07/2023 which revealed normal LV size and function, LVEF 64%, no LV LGE or scar, no evidence of myocardial infiltration.  She contacted our office on 09/07/2023 with concern for chest pain, palpitations.  She saw nephrology in 08/2023 in the setting of CKD stage IIIa s/p RCC with partial nephrectomy.    She presents today for follow-up accompanied by her husband. Since her last visit she has been stable overall from a cardiac standpoint. She continues to note intermittent chest discomfort which she described as a pinching, symptoms occur at rest, overall unchanged from prior visits.  She also notes intermittent palpitations.  She has stable dyspnea on exertion.  She is taking her torsemide  as prescribed.  She notes stable intermittent edema, denies PND, orthopnea, weight gain.  Home Medications    Current Outpatient Medications  Medication Sig Dispense Refill   acetaminophen  (TYLENOL ) 500 MG tablet Take 650 mg by mouth as needed for moderate pain or headache.     aspirin  EC 81 MG tablet Take 81 mg by mouth daily. Swallow whole.     atorvastatin  (LIPITOR) 20 MG tablet Take 1 tablet (20 mg total) by mouth daily. 30 tablet 11   gabapentin  (NEURONTIN ) 600 MG tablet TAKE 1 TABLET BY MOUTH TWICE  DAILY 200 tablet 2   levothyroxine  (SYNTHROID ) 150 MCG tablet Take 1 tablet (150 mcg total) by mouth daily.     Magnesium  400 MG CAPS Take 400 mg by mouth daily.     Multiple Vitamins-Minerals (CENTRUM SILVER PO) Take 1 tablet by mouth daily.     omeprazole (PRILOSEC) 20 MG capsule Take 20 mg by mouth daily. May take a second 20 mg dose as needed for heartburn     oxybutynin  (DITROPAN ) 5 MG tablet Take 5 mg by mouth 2 (two) times daily.      potassium chloride  (KLOR-CON ) 20 MEQ packet Take 20 mEq by mouth 2 (two) times daily.     torsemide  (DEMADEX ) 20 MG tablet Take 60 mg by mouth  daily.     Vitamin D , Ergocalciferol , (DRISDOL ) 1.25 MG (50000 UNIT) CAPS capsule TAKE 1 CAPSULE BY MOUTH WEEKLY 15 capsule 2   diphenhydrAMINE  (BENADRYL ) 50 MG capsule Take 50 mg by mouth at bedtime. (Patient not taking: Reported on 09/27/2023)     metoprolol  succinate (TOPROL -XL) 25 MG 24 hr tablet Take 1.5 tablets (37.5 mg total) by mouth daily. 135 tablet 3   No current facility-administered medications for this visit.     Review of Systems    She denies pnd, orthopnea,  n, v, dizziness, syncope, weight gain, or early satiety. All other systems reviewed and are otherwise negative except as noted above.   Physical Exam    VS:  BP 115/68 (BP Location: Left Arm, Patient Position: Sitting, Cuff Size: Large)   Pulse 80   Ht 5' 6 (1.676 m)   Wt 206 lb 9.6 oz (93.7 kg)   SpO2 92%   BMI 33.35 kg/m   GEN: Well nourished, well developed, in no acute distress. HEENT: normal. Neck: Supple, no JVD, carotid bruits, or masses. Cardiac: RRR, no murmurs, rubs, or gallops. No clubbing, cyanosis, edema.  Radials/DP/PT 2+ and equal bilaterally.  Respiratory:  Respirations regular and unlabored, clear to auscultation bilaterally. GI: Soft, nontender, nondistended, BS + x 4. MS: no deformity or atrophy. Skin: warm and dry, no rash. Neuro:  Strength and sensation are intact. Psych: Normal affect.  Accessory Clinical Findings    ECG personally reviewed by me today - EKG Interpretation Date/Time:  Monday September 27 2023 15:01:14 EST Ventricular Rate:  80 PR Interval:  132 QRS Duration:  72 QT Interval:  402 QTC Calculation: 463 R Axis:   -40  Text Interpretation: Normal sinus rhythm Left atrial enlargement Left axis deviation Possible Lateral infarct (cited on or before 27-Sep-2023) When compared with ECG of 04-May-2023 15:54, No significant change was found Confirmed by Daneen Perkins ([id number]) on 09/27/2023 3:06:21 PM  - no acute changes.   Lab Results  Component Value Date   WBC 8.6 01/14/2023    HGB 14.1 01/14/2023   HCT 42.3 01/14/2023   MCV 98.6 01/14/2023   PLT 241 01/14/2023   Lab Results  Component Value Date   CREATININE 1.17 (H) 01/14/2023   BUN 12 01/14/2023   NA 139 01/14/2023   K 3.2 (L) 01/14/2023   CL 97 (L) 01/14/2023   CO2 32 01/14/2023   Lab Results  Component Value Date   ALT 25 12/21/2022   AST 35 12/21/2022   ALKPHOS 66 12/21/2022   BILITOT 0.5 12/21/2022   Lab Results  Component Value Date   CHOL 141 06/07/2020   HDL 57 06/07/2020   LDLCALC 69 06/07/2020   TRIG 71 06/07/2020   CHOLHDL 2.5 06/07/2020    No results found for: HGBA1C  Assessment & Plan    1. CAD/chest pain/shortness of breath: Coronary CT angiogram in 2021 showed 50 to 74% stenosis along the mid RCA, normal FFR, minimal plaque elsewhere.  Cardiac PET stress test in 04/2023 was low risk, no evidence of ischemia or infarction, EF 74%.  Cardiac MRI in 07/2023 which revealed normal LV size and function, LVEF 64%, no LV LGE or scar, no evidence of myocardial infiltration.  She continues to note intermittent chest discomfort which she describes as a pinching.  Her symptoms occur at rest, overall unchanged from prior visits.  She also notes intermittent palpitations as below.  Workup to date reassuring.  Will increase metoprolol  as below.  If symptoms persist despite titration of beta-blocker, consider reducing metoprolol  back to 25 mg daily and possibly adding low-dose Imdur versus low-dose amlodipine  with close monitoring of BP.  Continue aspirin , metoprolol  as below, Lipitor.   2. SVT: Cardiac monitor in 12/2022 revealed predominantly sinus rhythm, frequent runs of SVT, rare PACs and PVCs.  She notes intermittent palpitations, episodes of near syncope, chest discomfort, and shortness of breath, unchanged from prior visits.  Will trial increased metoprolol  to 37.5 mg daily. Continue to monitor symptoms.    3. Syncope: Echo in 10/2022  showed EF 70 to 25%, no RWMA, no significant valvular  abnormalities. Outpatient monitor with brief runs of SVT, no significant arrhythmia.  Carotid Doppler showed no significant stenosis.  Denies any recurrent syncope, though she did have a fall approximately 3 months ago, she does not think she lost consciousness.  BP stable in office today.  Recent cardiac PET stress test, cardiac MRI were unremarkable. Reviewed ED precautions.    3. Bilateral lower extremity edema: Improved with increased torsemide  dosing.  Most recent echo as above. She does note some ongoing shortness of breath, nonpitting bilateral lower extremity edema.  Generally euvolemic and well compensated on exam.  Continue torsemide .   4. Hypertension: BP well controlled. Continue current antihypertensive regimen.    5. Hyperlipidemia: LDL was 110 in 09/2021, above goal.  Monitored per PCP.  Continue Lipitor.    6. OSA: Adherent to CPAP.   7. Disposition: Follow-up as scheduled with Dr. Burnard in 10/2023.       Damien JAYSON Braver, NP 09/27/2023, 4:48 PM

## 2023-09-27 NOTE — Patient Instructions (Addendum)
 Medication Instructions:  Increase Metoprolol  37.5 mg daily.  *If you need a refill on your cardiac medications before your next appointment, please call your pharmacy*   Lab Work: NONE ordered at this time of appointment   Testing/Procedures: NONE ordered at this time of appointment   Follow-Up: At [facility], you and your health needs are our priority.  As part of our continuing mission to provide you with exceptional heart care, we have created designated Provider Care Teams.  These Care Teams include your primary Cardiologist (physician) and Advanced Practice Providers (APPs -  Physician Assistants and Nurse Practitioners) who all work together to provide you with the care you need, when you need it.  We recommend signing up for the patient portal called MyChart.  Sign up information is provided on this After Visit Summary.  MyChart is used to connect with patients for Virtual Visits (Telemedicine).  Patients are able to view lab/test results, encounter notes, upcoming appointments, etc.  Non-urgent messages can be sent to your provider as well.   To learn more about what you can do with MyChart, go to [email or web address].    Your next appointment:    Keep follow up appointment   Provider:   Debby Sor, MD

## 2023-10-05 DIAGNOSIS — S0083XA Contusion of other part of head, initial encounter: Secondary | ICD-10-CM | POA: Diagnosis not present

## 2023-10-05 DIAGNOSIS — E039 Hypothyroidism, unspecified: Secondary | ICD-10-CM | POA: Diagnosis not present

## 2023-10-05 DIAGNOSIS — R600 Localized edema: Secondary | ICD-10-CM | POA: Diagnosis not present

## 2023-10-05 DIAGNOSIS — N1831 Chronic kidney disease, stage 3a: Secondary | ICD-10-CM | POA: Diagnosis not present

## 2023-10-05 DIAGNOSIS — E6609 Other obesity due to excess calories: Secondary | ICD-10-CM | POA: Diagnosis not present

## 2023-10-05 DIAGNOSIS — I1310 Hypertensive heart and chronic kidney disease without heart failure, with stage 1 through stage 4 chronic kidney disease, or unspecified chronic kidney disease: Secondary | ICD-10-CM | POA: Diagnosis not present

## 2023-10-05 DIAGNOSIS — G4733 Obstructive sleep apnea (adult) (pediatric): Secondary | ICD-10-CM | POA: Diagnosis not present

## 2023-10-05 DIAGNOSIS — Z6832 Body mass index (BMI) 32.0-32.9, adult: Secondary | ICD-10-CM | POA: Diagnosis not present

## 2023-10-05 DIAGNOSIS — I10 Essential (primary) hypertension: Secondary | ICD-10-CM | POA: Diagnosis not present

## 2023-10-05 DIAGNOSIS — I2510 Atherosclerotic heart disease of native coronary artery without angina pectoris: Secondary | ICD-10-CM | POA: Diagnosis not present

## 2023-10-08 DIAGNOSIS — Z6833 Body mass index (BMI) 33.0-33.9, adult: Secondary | ICD-10-CM | POA: Diagnosis not present

## 2023-10-08 DIAGNOSIS — G629 Polyneuropathy, unspecified: Secondary | ICD-10-CM | POA: Diagnosis not present

## 2023-10-08 DIAGNOSIS — R2681 Unsteadiness on feet: Secondary | ICD-10-CM | POA: Diagnosis not present

## 2023-10-08 DIAGNOSIS — K219 Gastro-esophageal reflux disease without esophagitis: Secondary | ICD-10-CM | POA: Diagnosis not present

## 2023-10-08 DIAGNOSIS — Z853 Personal history of malignant neoplasm of breast: Secondary | ICD-10-CM | POA: Diagnosis not present

## 2023-10-08 DIAGNOSIS — Z85528 Personal history of other malignant neoplasm of kidney: Secondary | ICD-10-CM | POA: Diagnosis not present

## 2023-10-08 DIAGNOSIS — E785 Hyperlipidemia, unspecified: Secondary | ICD-10-CM | POA: Diagnosis not present

## 2023-10-08 DIAGNOSIS — F17211 Nicotine dependence, cigarettes, in remission: Secondary | ICD-10-CM | POA: Diagnosis not present

## 2023-10-08 DIAGNOSIS — E669 Obesity, unspecified: Secondary | ICD-10-CM | POA: Diagnosis not present

## 2023-10-08 DIAGNOSIS — Z008 Encounter for other general examination: Secondary | ICD-10-CM | POA: Diagnosis not present

## 2023-10-08 DIAGNOSIS — M1990 Unspecified osteoarthritis, unspecified site: Secondary | ICD-10-CM | POA: Diagnosis not present

## 2023-10-08 DIAGNOSIS — G4733 Obstructive sleep apnea (adult) (pediatric): Secondary | ICD-10-CM | POA: Diagnosis not present

## 2023-10-08 DIAGNOSIS — N1831 Chronic kidney disease, stage 3a: Secondary | ICD-10-CM | POA: Diagnosis not present

## 2023-10-08 DIAGNOSIS — I129 Hypertensive chronic kidney disease with stage 1 through stage 4 chronic kidney disease, or unspecified chronic kidney disease: Secondary | ICD-10-CM | POA: Diagnosis not present

## 2023-10-08 DIAGNOSIS — E039 Hypothyroidism, unspecified: Secondary | ICD-10-CM | POA: Diagnosis not present

## 2023-10-19 ENCOUNTER — Other Ambulatory Visit: Payer: Self-pay | Admitting: Family Medicine

## 2023-10-19 DIAGNOSIS — S0093XA Contusion of unspecified part of head, initial encounter: Secondary | ICD-10-CM

## 2023-10-25 ENCOUNTER — Inpatient Hospital Stay: Payer: No Typology Code available for payment source | Attending: Hematology

## 2023-10-25 ENCOUNTER — Other Ambulatory Visit: Payer: Self-pay | Admitting: Orthopedic Surgery

## 2023-10-25 ENCOUNTER — Encounter (HOSPITAL_COMMUNITY): Payer: Self-pay

## 2023-10-25 ENCOUNTER — Ambulatory Visit (HOSPITAL_COMMUNITY)
Admission: RE | Admit: 2023-10-25 | Discharge: 2023-10-25 | Disposition: A | Discharge status: Home / Self Care | Payer: No Typology Code available for payment source | Source: Ambulatory Visit | Attending: Physician Assistant | Admitting: Physician Assistant

## 2023-10-25 DIAGNOSIS — Z85528 Personal history of other malignant neoplasm of kidney: Secondary | ICD-10-CM | POA: Insufficient documentation

## 2023-10-25 DIAGNOSIS — E559 Vitamin D deficiency, unspecified: Secondary | ICD-10-CM | POA: Insufficient documentation

## 2023-10-25 DIAGNOSIS — Z86000 Personal history of in-situ neoplasm of breast: Secondary | ICD-10-CM | POA: Diagnosis not present

## 2023-10-25 DIAGNOSIS — D0511 Intraductal carcinoma in situ of right breast: Secondary | ICD-10-CM

## 2023-10-25 DIAGNOSIS — Z87891 Personal history of nicotine dependence: Secondary | ICD-10-CM | POA: Diagnosis not present

## 2023-10-25 DIAGNOSIS — Z1231 Encounter for screening mammogram for malignant neoplasm of breast: Secondary | ICD-10-CM | POA: Diagnosis not present

## 2023-10-25 DIAGNOSIS — Z905 Acquired absence of kidney: Secondary | ICD-10-CM | POA: Diagnosis not present

## 2023-10-25 DIAGNOSIS — Z9011 Acquired absence of right breast and nipple: Secondary | ICD-10-CM | POA: Diagnosis not present

## 2023-10-25 LAB — CBC WITH DIFFERENTIAL/PLATELET
Abs Immature Granulocytes: 0.02 10*3/uL (ref 0.00–0.07)
Basophils Absolute: 0.1 10*3/uL (ref 0.0–0.1)
Basophils Relative: 1 %
Eosinophils Absolute: 0.1 10*3/uL (ref 0.0–0.5)
Eosinophils Relative: 1 %
HCT: 43 % (ref 36.0–46.0)
Hemoglobin: 14 g/dL (ref 12.0–15.0)
Immature Granulocytes: 0 %
Lymphocytes Relative: 42 %
Lymphs Abs: 3.6 10*3/uL (ref 0.7–4.0)
MCH: 31.5 pg (ref 26.0–34.0)
MCHC: 32.6 g/dL (ref 30.0–36.0)
MCV: 96.6 fL (ref 80.0–100.0)
Monocytes Absolute: 0.7 10*3/uL (ref 0.1–1.0)
Monocytes Relative: 8 %
Neutro Abs: 4 10*3/uL (ref 1.7–7.7)
Neutrophils Relative %: 48 %
Platelets: 241 10*3/uL (ref 150–400)
RBC: 4.45 MIL/uL (ref 3.87–5.11)
RDW: 13.7 % (ref 11.5–15.5)
WBC: 8.5 10*3/uL (ref 4.0–10.5)
nRBC: 0.2 % (ref 0.0–0.2)

## 2023-10-25 LAB — COMPREHENSIVE METABOLIC PANEL
ALT: 28 U/L (ref 0–44)
AST: 47 U/L — ABNORMAL HIGH (ref 15–41)
Albumin: 2.4 g/dL — ABNORMAL LOW (ref 3.5–5.0)
Alkaline Phosphatase: 71 U/L (ref 38–126)
Anion gap: 14 (ref 5–15)
BUN: 15 mg/dL (ref 8–23)
CO2: 29 mmol/L (ref 22–32)
Calcium: 8.7 mg/dL — ABNORMAL LOW (ref 8.9–10.3)
Chloride: 99 mmol/L (ref 98–111)
Creatinine, Ser: 1.17 mg/dL — ABNORMAL HIGH (ref 0.44–1.00)
GFR, Estimated: 48 mL/min — ABNORMAL LOW (ref >60)
Glucose, Bld: 108 mg/dL — ABNORMAL HIGH (ref 70–99)
Potassium: 3.8 mmol/L (ref 3.5–5.1)
Sodium: 142 mmol/L (ref 135–145)
Total Bilirubin: 0.7 mg/dL (ref 0.0–1.2)
Total Protein: 6.3 g/dL — ABNORMAL LOW (ref 6.5–8.1)

## 2023-10-25 LAB — VITAMIN D 25 HYDROXY (VIT D DEFICIENCY, FRACTURES): Vit D, 25-Hydroxy: 75.72 ng/mL (ref 30–100)

## 2023-10-25 MED ORDER — GABAPENTIN 600 MG PO TABS: 600.0000 mg | ORAL_TABLET | Freq: Two times a day (BID) | ORAL | 2 refills | Status: DC

## 2023-10-28 ENCOUNTER — Telehealth: Payer: Self-pay | Admitting: Cardiovascular Disease

## 2023-10-28 NOTE — Telephone Encounter (Signed)
*  STAT* If patient is at the pharmacy, call can be transferred to refill team.   1. Which medications need to be refilled? (please list name of each medication and dose if known)   metoprolol  succinate (TOPROL -XL) 25 MG 24 hr tablet  torsemide  (DEMADEX ) 20 MG tablet   2. Would you like to learn more about the convenience, safety, & potential cost savings by using the [facility]?   3. Are you open to using the [facility] (Type [facility]. ).  4. Which pharmacy/location (including street and city if local pharmacy) is medication to be sent to?  [facility] - [place], [place] - [facility] AT Portal to Registered [facility] Sites   5. Do they need a 30 day or 90 day supply?   90 day  Patient stated she still has medication but recently changed insurance and pharmacies.  Patient stated prescriptions need to be sent to [facility] at [phone], ref# [id number].

## 2023-10-29 MED ORDER — TORSEMIDE 20 MG PO TABS: 60.0000 mg | ORAL_TABLET | Freq: Every day | ORAL | 3 refills | Status: DC

## 2023-10-29 MED ORDER — METOPROLOL SUCCINATE ER 25 MG PO TB24: 37.5000 mg | ORAL_TABLET | Freq: Every day | ORAL | 3 refills | Status: DC

## 2023-10-29 NOTE — Telephone Encounter (Signed)
 Pt's medications were sent to pt's pharmacy as requested. Confirmation received.

## 2023-11-04 ENCOUNTER — Other Ambulatory Visit: Payer: Self-pay

## 2023-11-04 ENCOUNTER — Inpatient Hospital Stay (HOSPITAL_BASED_OUTPATIENT_CLINIC_OR_DEPARTMENT_OTHER): Payer: No Typology Code available for payment source | Admitting: Oncology

## 2023-11-04 ENCOUNTER — Encounter: Payer: Self-pay | Admitting: Family Medicine

## 2023-11-04 VITALS — BP 114/75 | HR 59 | Temp 98.8°F | Resp 18 | Ht 66.0 in | Wt 209.0 lb

## 2023-11-04 DIAGNOSIS — D0511 Intraductal carcinoma in situ of right breast: Secondary | ICD-10-CM | POA: Diagnosis not present

## 2023-11-04 DIAGNOSIS — E559 Vitamin D deficiency, unspecified: Secondary | ICD-10-CM | POA: Diagnosis not present

## 2023-11-04 DIAGNOSIS — Z85528 Personal history of other malignant neoplasm of kidney: Secondary | ICD-10-CM | POA: Diagnosis not present

## 2023-11-04 NOTE — Progress Notes (Signed)
[facility] [address], [place] [address]   CLINIC:  Medical Oncology/Hematology  PCP:  Mirna Mires, MD [address] / [place] [address] [phone]   REASON FOR VISIT:  Follow-up for history of right breast DCIS/LCIS   PRIOR THERAPY: - Right simple mastectomy (11/20/2014) - Tamoxifen (01/01/2015 through 01/19/2020)   CURRENT THERAPY: Surveillance  BRIEF ONCOLOGIC HISTORY:   Oncology History  Malignant neoplasm of upper outer quadrant of female breast (HCC)  09/05/2014 Imaging   assymetry noted in R breast on screening mammogram   09/18/2014 Imaging   Irregular mass in the 12 o'clock location of the right breast. Tissue diagnosis is recommended.   09/18/2014 Imaging   IMPRESSION: Irregular mass in the 12 o'clock location of the right breast. Tissue diagnosis is recommended.   10/09/2014 Imaging   8 x 12 x 8 CM abnormal linear clumped enhancement throughout the majority of the central and upper right breast.   10/17/2014 Initial Biopsy   Breast, right, needle core biopsy, central - DUCTAL CARCINOMA IN SITU, MIXED PATTERNS (CRIBRIFORM, COMEDO AND PAPILLARY), INVOLVING MULTIPLE CORES, AT LEAST 9 MM IN MAXIMAL EXTENT IN ANY ONE CORE. - FOCAL LOBULAR NEOPLASIA (ALH/LCIS). - NO INVASIVE CAR   11/20/2014 Definitive Surgery   Breast, simple mastectomy, right - DUCTAL CARCINOMA IN SITU, SEE COMMENT. - LOBULAR CARCINOMA IN SITU (CONVENTIONAL AND PLEOMORPHIC TYPES). - PREVIOUS BIOPSY SITES (X2). - IN SITU CARCINOMA IS 3CM FROM NEAREST MARGIN (DEEP).   01/01/2015 -  Anti-estrogen oral therapy   Tamoxifen daily.   07/21/2016 Breast US   BIRADS 4   07/29/2016 Procedure   Ultrasound guided biopsy of right axillary mass/lymph node. No apparent complications.   07/30/2016 Pathology Results   Pathology of the right axillary lymph node biopsy revealed LYMPHOID TISSUE WITH DERMATOPATHIC CHANGE. NO METASTATIC CARCINOMA IDENTIFIED.    Renal cell carcinoma  (HCC)  01/08/2016 Procedure   Robotic-assisted laparoscopic right partial nephrectomy by Dr. Berneice Heinrich.   01/08/2016 Pathology Results   Kidney, wedge excision / partial resection, right renal mass RENAL CELL CARCINOMA, CLEAR CELL TYPE, WHO NUCLEAR GRADE 2 (2.4 CM) THE TUMOR IS CONFINED TO THE KIDNEY MARGINS OF RESECTION ARE NEGATIVE FOR TUMOR     CANCER STAGING:  Cancer Staging  Malignant neoplasm of upper outer quadrant of female breast (HCC) Staging form: Breast, AJCC 7th Edition - Clinical: Stage 0 (Tis (DCIS), N0, M0) - Signed by Lurline Hare, MD on 10/11/2014  Renal cell carcinoma [facility]) Staging form: Kidney, AJCC 7th Edition - Clinical stage from 06/02/2016: Stage Unknown (T1a, NX, M0) - Signed by Ellouise Newer, PA-C on 06/02/2016   INTERVAL HISTORY:   [name], a 78 y.o. female, returns for routine follow-up of her history of right breast DCIS/LCIS. [name] was last seen on 12/25/2022.  At today's visit, she  reports feeling fair.  She denies any recent hospitalizations, surgeries, or changes in her  baseline health status.  She reports chronic right arm lymphedema and right leg edema although it has significantly improved since she has been drinking alkaline water.  Reports she started drinking alkaline water in late December/early January.    Reports some mild depression secondary to the loss of her sister over the past 2 months.  She continues to have left breast tenderness with palpation near her left breast biopsy scar.   She reports ongoing dyspnea on exertion and bilateral lower extremity edema, is following closely with cardiology.  She was  recently evaluated by cardiology for intermittent chest discomfort at rest and palpitations.  Her metoprolol was increased.  She admits to fatigue and chronic generalized pain.  She has not noticed any new lumps or bumps.  She denies any new neurologic deficits or seizures.  No B symptoms such as fever, chills, night  sweats, unintentional weight loss.   She reports 60% energy and 40% appetite.  She is maintaining stable weight at this time.   ASSESSMENT & PLAN:  1.  Right breast DCIS/LCIS: - Diagnosed in December 2015 due to abnormal screening mammogram - Status post right simple mastectomy on 11/20/2014, lymph node biopsy negative.  Grade 2, 10 cm, ER/PR positive. -Tamoxifen started on 01/01/2015.  She completed 5 years.  She stopped the tamoxifen on 01/19/2020. - She also has history of left breast biopsy around 2014   2.  Right renal cell carcinoma: - She had a T1 a NX RCC which was treated with partial right nephrectomy (01/08/2016). - Last CT scan on 10/01/2017 showed no recurrence.  Hepatic steatosis was seen. - January 2021 she had repeat CT scans which was reportedly normal.  She was released by Dr. Berneice Heinrich (urologist)   3.  Vitamin D deficiency: - Labs done on 10/22/2022 show a vitamin D level of 40.4. - She is taking 50,000 units weekly of vitamin D.  PLAN: 1. Ductal carcinoma in situ (DCIS) of right breast (Primary) - Most recent unilateral left breast screening mammogram (10/25/23) did not show any evidence of malignancy (BI-RADS Category 1, negative) - Most recent labs (10/25/23): CBC normal, CMP at baseline, with minimally elevated AST 47 in the setting of fatty liver disease.  - Physical exam did not reveal any discrete nodules or masses in left breast or right chest wall.   There is dense scar tissue at the site of prior left breast biopsy/lumpectomy, which is tender to palpation.  No axillary, supraclavicular, or pectoral lymphadenopathy. - No "red flag" symptoms per patient history - Repeat annual labs, mammogram, and office visit with physical exam in 1 year.  2. Vitamin D deficiency -Vitamin D level from 10/25/2023 is 75.72. -Recommend reducing vitamin D to twice a month.  PLAN SUMMARY: >> Labs in 1 year (CBC/D, CMP, vitamin D) >> Mammogram in 1 year >> OFFICE visit in 1 year (1 week after  labs/mammogram)    REVIEW OF SYSTEMS:   Review of Systems  HENT:   Positive for trouble swallowing.   Respiratory:  Positive for shortness of breath.   Cardiovascular:  Positive for chest pain.  Gastrointestinal:  Positive for constipation.  Genitourinary:  Positive for frequency.   Musculoskeletal:  Positive for gait problem.  Neurological:  Positive for dizziness and gait problem.  Psychiatric/Behavioral:  Positive for depression.     PHYSICAL EXAM:   Performance status (ECOG): 1 - Symptomatic but completely ambulatory  There were no vitals filed for this visit. Wt Readings from Last 3 Encounters:  09/27/23 206 lb 9.6 oz (93.7 kg)  07/28/23 217 lb (98.4 kg)  05/04/23 217 lb (98.4 kg)   Physical Exam Constitutional:      Appearance: Normal appearance. She is obese.  HENT:     Head: Normocephalic and atraumatic.     Mouth/Throat:     Mouth: Mucous membranes are moist.  Eyes:     Extraocular Movements: Extraocular movements intact.     Pupils: Pupils are equal, round, and reactive to light.  Cardiovascular:     Rate and Rhythm: Normal rate and  regular rhythm.     Pulses: Normal pulses.     Heart sounds: Normal heart sounds.  Pulmonary:     Effort: Pulmonary effort is normal.     Breath sounds: Normal breath sounds.  Chest:  Breasts:    Right: Absent.     Left: Tenderness (left lateral breast) present. No swelling, bleeding, inverted nipple, mass, nipple discharge or skin change.    Abdominal:     General: Bowel sounds are normal.     Palpations: Abdomen is soft.     Tenderness: There is no abdominal tenderness.  Musculoskeletal:        General: No swelling.     Right lower leg: 2+ Edema present.     Left lower leg: 2+ Edema present.  Lymphadenopathy:     Cervical: No cervical adenopathy.     Upper Body:     Right upper body: No supraclavicular, axillary or pectoral adenopathy.     Left upper body: No supraclavicular, axillary or pectoral adenopathy.  Skin:     General: Skin is warm and dry.  Neurological:     General: No focal deficit present.     Mental Status: She is alert and oriented to person, place, and time.  Psychiatric:        Mood and Affect: Mood normal.        Behavior: Behavior normal.     PAST MEDICAL/SURGICAL HISTORY:  Past Medical History:  Diagnosis Date   Allergic rhinitis due to pollen    Anxiety    Arthritis    "knees, back, right elbow; left shoulder; right ankle" (11/20/2014)   Breast cancer, right breast (HCC) 2016   "DCIS; zero stage" S/P mastectomy, cancer free since then   Cervical spondylosis without myelopathy    Cervicalgia    Chronic lower back pain    Constipation    takes Miralax daily   Degeneration of cervical intervertebral disc    Depression    Diverticulosis of colon (without mention of hemorrhage)    GERD (gastroesophageal reflux disease)    takes Omeprazole daily   Graves' disease    "took a pill to correct"   H/O hiatal hernia    H/O urinary frequency    Headache(784.0)    denies migraines since the 80's but has occ and takes Butalbital prn   HTN (hypertension)    takes Metoprolol and Lisinopril daily   Insomnia    takes Ativan nigtly   Joint pain    "all of them"   Joint swelling    "knees, legs, ankles sometimes" (11/20/2014)   Morbid obesity (HCC)    Numbness    LOWER LEGS   Other postablative hypothyroidism    takes SYnthroid daily   Palpitations    Peptic ulcer, unspecified site, unspecified as acute or chronic, without mention of hemorrhage, perforation, or obstruction    Primary localized osteoarthrosis, lower leg    Pure hypercholesterolemia    takes Pravastin daily   Recurrent UTI    Renal cell carcinoma (HCC) 01/08/2016   Right renal mass s/p partial right nephrectomy, cancer free since 2017   Shortness of breath    Sleep apnea    slight but doesn't require a CPAP (11/20/2014)   Tendonitis of knee    bilateral   Past Surgical History:  Procedure Laterality Date    ABDOMINAL HYSTERECTOMY     ANTERIOR LAT LUMBAR FUSION  05/13/2012   Procedure: ANTERIOR LATERAL LUMBAR FUSION 2 LEVELS;  Surgeon: Rolanda Lundborg Kritzer,  MD;  Location: [facility];  Service: Neurosurgery;  Laterality: Left;  Left Lumbar Three-four,Lumbar four-five Extreme Lumbar Interbody Fusion with Percutaneous Pedicle Screws   APPENDECTOMY     BREAST BIOPSY Left ~ 2014   BREAST BIOPSY Right 2015   BREAST LUMPECTOMY Left 1988   BREAST RECONSTRUCTION WITH PLACEMENT OF TISSUE EXPANDER AND FLEX HD (ACELLULAR HYDRATED DERMIS) Right 11/20/2014   BREAST RECONSTRUCTION WITH PLACEMENT OF TISSUE EXPANDER AND FLEX HD (ACELLULAR HYDRATED DERMIS) Right 11/20/2014   Procedure: RIGHT BREAST RECONSTRUCTION WITH PLACEMENT OF TISSUE EXPANDER AND ACELLULAR DERMA MATRIX (ACELLULAR DERMA MATRIX);  Surgeon: Etter Sjogren, MD;  Location: [facility] OR;  Service: Plastics;  Laterality: Right;   CATARACT EXTRACTION W/ INTRAOCULAR LENS  IMPLANT, BILATERAL Bilateral    COLONOSCOPY  Sept 2008   RMR: normal rectum, left-sided diverticula, repeat in 2018   COLONOSCOPY WITH PROPOFOL N/A 03/21/2018   Dr. Jena Gauss: diverticulosis, internal grade I hemorrhoids. no future colonoscopies unless develops new symptoms   DIAGNOSTIC LAPAROSCOPY     DILATION AND CURETTAGE OF UTERUS     ESOPHAGOGASTRODUODENOSCOPY     ESOPHAGOGASTRODUODENOSCOPY  2013   Dr. Jena Gauss: Cervical esophageal web and Schatzki's ring s/p dilation, small hiatal hernia, antral and duodenal erosions likely NSAID effect, chronic duodenitis on path    ESOPHAGOGASTRODUODENOSCOPY (EGD) WITH PROPOFOL N/A 03/21/2018   Dr. Jena Gauss: mild Schatzki ring s/p dilation   FOREIGN BODY REMOVAL Right 10/06/2013   Procedure: REMOVAL FOREIGN BODY EXTREMITY;  Surgeon: Vickki Hearing, MD;  Location: [facility];  Service: Orthopedics;  Laterality: Right;   INJECTION KNEE Left 10/06/2013   Procedure: KNEE INJECTION;  Surgeon: Vickki Hearing, MD;  Location: [facility];  Service: Orthopedics;  Laterality: Left;    JOINT REPLACEMENT Bilateral    knees   KNEE ARTHROSCOPY Right    KNEE ARTHROSCOPY WITH LATERAL MENISECTOMY Right 10/06/2013   Procedure: KNEE ARTHROSCOPY WITH LATERAL AND MEDIAL MENISECTOMY;  Surgeon: Vickki Hearing, MD;  Location: [facility];  Service: Orthopedics;  Laterality: Right;  END @ 1234   lumpectomy on pelvis     "mass of nerves"   MALONEY DILATION N/A 03/21/2018   Procedure: MALONEY DILATION;  Surgeon: Corbin Ade, MD;  Location: [facility];  Service: Endoscopy;  Laterality: N/A;   MASTECTOMY COMPLETE / SIMPLE W/ SENTINEL NODE BIOPSY Right 11/20/2014   axillary   REMOVAL OF TISSUE EXPANDER AND PLACEMENT OF IMPLANT Right 12/13/2014   Procedure: REMOVAL OF RIGHT BREAST TISSUE EXPANDER;  Surgeon: Etter Sjogren, MD;  Location: [facility] OR;  Service: Plastics;  Laterality: Right;   ROBOTIC ASSITED PARTIAL NEPHRECTOMY Right 01/08/2016   Procedure: XI ROBOTIC ASSITED PARTIAL NEPHRECTOMY;  Surgeon: Sebastian Ache, MD;  Location: [facility];  Service: Urology;  Laterality: Right;   SIMPLE MASTECTOMY WITH AXILLARY SENTINEL NODE BIOPSY Right 11/20/2014   Procedure: SIMPLE MASTECTOMY WITH AXILLARY SENTINEL NODE BIOPSY;  Surgeon: Harriette Bouillon, MD;  Location: [facility] OR;  Service: General;  Laterality: Right;   TISSUE EXPANDER REMOVAL Right 12/13/2014   dr Odis Luster   TONSILLECTOMY     TOTAL KNEE ARTHROPLASTY Right 01/24/2014   Procedure: TOTAL KNEE ARTHROPLASTY;  Surgeon: Vickki Hearing, MD;  Location: [facility];  Service: Orthopedics;  Laterality: Right;   TOTAL KNEE ARTHROPLASTY Left 08/08/2014   Procedure: TOTAL KNEE ARTHROPLASTY;  Surgeon: Vickki Hearing, MD;  Location: [facility];  Service: Orthopedics;  Laterality: Left;   TUBAL LIGATION     UPPER GASTROINTESTINAL ENDOSCOPY      SOCIAL HISTORY:  Social  History   Socioeconomic History   Marital status: Married    Spouse name: Shon Hale   Number of children: 4   Years of education: Not on file   Highest education level: Not on file  Occupational  History   Occupation: disability    Employer: UNEMPLOYED    Comment: since 2008, knee surgery  Tobacco Use   Smoking status: Former    Current packs/day: 0.00    Average packs/day: 1 pack/day for 27.0 years (27.0 ttl pk-yrs)    Types: Cigarettes    Start date: 12/27/1958    Quit date: 12/26/1985    Years since quitting: 37.8   Smokeless tobacco: Never   Tobacco comments:    Quit in 1987  Vaping Use   Vaping status: Never Used  Substance and Sexual Activity   Alcohol use: Yes    Alcohol/week: 0.0 standard drinks of alcohol    Comment: occasional wine   Drug use: No   Sexual activity: Yes    Birth control/protection: Surgical  Other Topics Concern   Not on file  Social History Narrative   Currently married for 10 years. Husband's name is Shon Hale. She is an excellent singer. 8 children between she and her husband. 23 grand children. 12 great grandchildren.   Social Drivers of Corporate investment banker Strain: Not on file  Food Insecurity: No Food Insecurity (12/20/2022)   Hunger Vital Sign    Worried About Running Out of Food in the Last Year: Never true    Ran Out of Food in the Last Year: Never true  Transportation Needs: No Transportation Needs (12/20/2022)   PRAPARE - Administrator, Civil Service (Medical): No    Lack of Transportation (Non-Medical): No  Physical Activity: Not on file  Stress: Not on file  Social Connections: Not on file  Intimate Partner Violence: Not At Risk (12/20/2022)   Humiliation, Afraid, Rape, and Kick questionnaire    Fear of Current or Ex-Partner: No    Emotionally Abused: No    Physically Abused: No    Sexually Abused: No    FAMILY HISTORY:  Family History  Problem Relation Age of Onset   Stroke Sister    Heart attack Sister        In her 19s   Osteoarthritis Sister    Heart attack Sister        In her 3s   Osteoarthritis Brother    Obesity Brother    Heart attack Sister        In her 28s   Heart attack Father         He died at age 46   Lung cancer Maternal Grandmother        non-smoker   Colon cancer Neg Hx        Mother died at 67 from blood clot, MI    CURRENT MEDICATIONS:  Current Outpatient Medications  Medication Sig Dispense Refill   acetaminophen (TYLENOL) 500 MG tablet Take 650 mg by mouth as needed for moderate pain or headache.     aspirin EC 81 MG tablet Take 81 mg by mouth daily. Swallow whole.     atorvastatin (LIPITOR) 20 MG tablet Take 1 tablet (20 mg total) by mouth daily. 30 tablet 11   diphenhydrAMINE (BENADRYL) 50 MG capsule Take 50 mg by mouth at bedtime. (Patient not taking: Reported on 09/27/2023)     gabapentin (NEURONTIN) 600 MG tablet Take 1 tablet (600 mg total) by mouth 2 (  two) times daily. 180 tablet 2   levothyroxine (SYNTHROID) 150 MCG tablet Take 1 tablet (150 mcg total) by mouth daily.     Magnesium 400 MG CAPS Take 400 mg by mouth daily.     metoprolol succinate (TOPROL-XL) 25 MG 24 hr tablet Take 1.5 tablets (37.5 mg total) by mouth daily. 135 tablet 3   Multiple Vitamins-Minerals (CENTRUM SILVER PO) Take 1 tablet by mouth daily.     omeprazole (PRILOSEC) 20 MG capsule Take 20 mg by mouth daily. May take a second 20 mg dose as needed for heartburn     oxybutynin (DITROPAN) 5 MG tablet Take 5 mg by mouth 2 (two) times daily.      potassium chloride (KLOR-CON) 20 MEQ packet Take 20 mEq by mouth 2 (two) times daily.     torsemide (DEMADEX) 20 MG tablet Take 3 tablets (60 mg total) by mouth daily. 270 tablet 3   Vitamin D, Ergocalciferol, (DRISDOL) 1.25 MG (50000 UNIT) CAPS capsule TAKE 1 CAPSULE BY MOUTH WEEKLY 15 capsule 2   No current facility-administered medications for this visit.    ALLERGIES:  Allergies  Allergen Reactions   Propoxyphene Hcl Other (See Comments)     Hallucinations   Pollen Extract Other (See Comments)    Sneezing, congestion   Camphor Itching   Dust Mite Extract Itching    Sneezing. Runny nose    Latex Dermatitis and Rash   Molds &  Smuts Itching   Tomato Hives    LABORATORY DATA:  I have reviewed the labs as listed.     Latest Ref Rng & Units 10/25/2023    2:01 PM 01/14/2023   11:24 PM 12/22/2022    4:39 AM  CBC  WBC 4.0 - 10.5 K/uL 8.5  8.6  5.5   Hemoglobin 12.0 - 15.0 g/dL 69.6  29.5  28.4   Hematocrit 36.0 - 46.0 % 43.0  42.3  37.3   Platelets 150 - 400 K/uL 241  241  211       Latest Ref Rng & Units 10/25/2023    2:01 PM 01/14/2023   11:24 PM 12/22/2022    4:39 AM  CMP  Glucose 70 - 99 mg/dL 132  440  95   BUN 8 - 23 mg/dL 15  12  15    Creatinine 0.44 - 1.00 mg/dL 1.02  7.25  3.66   Sodium 135 - 145 mmol/L 142  139  137   Potassium 3.5 - 5.1 mmol/L 3.8  3.2  3.6   Chloride 98 - 111 mmol/L 99  97  105   CO2 22 - 32 mmol/L 29  32  26   Calcium 8.9 - 10.3 mg/dL 8.7  8.3  7.7   Total Protein 6.5 - 8.1 g/dL 6.3     Total Bilirubin 0.0 - 1.2 mg/dL 0.7     Alkaline Phos 38 - 126 U/L 71     AST 15 - 41 U/L 47     ALT 0 - 44 U/L 28       DIAGNOSTIC IMAGING:  I have independently reviewed the scans and discussed with the patient. MM 3D SCREEN BREAST UNI LEFT Result Date: 10/27/2023 CLINICAL DATA:  Screening. Personal history of malignant RIGHT mastectomy in 2016. EXAM: DIGITAL SCREENING UNILATERAL LEFT MAMMOGRAM WITH CAD AND TOMOSYNTHESIS TECHNIQUE: Left screening digital craniocaudal and mediolateral oblique mammograms were obtained. Left screening digital breast tomosynthesis was performed. The images were evaluated with computer-aided detection. COMPARISON:  Previous exam(s). ACR  Breast Density Category b: There are scattered areas of fibroglandular density. FINDINGS: The patient has had a right mastectomy. There are no findings suspicious for malignancy in the LEFT breast. IMPRESSION: No mammographic evidence of malignancy. A result letter of this screening mammogram will be mailed directly to the patient. RECOMMENDATION: Screening mammogram in one year.  (Code:SM-L-52M) BI-RADS CATEGORY  1: Negative.  Electronically Signed   By: Hulan Saas M.D.   On: 10/27/2023 16:33     WRAP UP:  All questions were answered. The patient knows to call the clinic with any problems, questions or concerns.  Medical decision making: Moderate  Time spent on visit: I spent 20 minutes dedicated to the care of this patient (face-to-face and non-face-to-face) on the date of the encounter to include what is described in the assessment and plan.   Mauro Kaufmann, NP  11/04/23 1:29 PM

## 2023-11-10 ENCOUNTER — Ambulatory Visit
Admission: RE | Admit: 2023-11-10 | Discharge: 2023-11-10 | Disposition: A | Discharge status: Home / Self Care | Payer: No Typology Code available for payment source | Source: Ambulatory Visit | Attending: Family Medicine | Admitting: Family Medicine

## 2023-11-10 DIAGNOSIS — R519 Headache, unspecified: Secondary | ICD-10-CM | POA: Diagnosis not present

## 2023-11-10 DIAGNOSIS — R42 Dizziness and giddiness: Secondary | ICD-10-CM | POA: Diagnosis not present

## 2023-11-10 DIAGNOSIS — S0093XA Contusion of unspecified part of head, initial encounter: Secondary | ICD-10-CM

## 2023-11-11 ENCOUNTER — Ambulatory Visit: Payer: Medicare Other | Admitting: Physician Assistant

## 2023-11-11 ENCOUNTER — Ambulatory Visit: Payer: Medicare Other | Admitting: Cardiovascular Disease

## 2023-11-15 ENCOUNTER — Encounter: Payer: Self-pay | Admitting: Cardiovascular Disease

## 2023-11-15 ENCOUNTER — Ambulatory Visit
Payer: No Typology Code available for payment source | Attending: Cardiovascular Disease | Admitting: Cardiovascular Disease

## 2023-11-15 VITALS — BP 126/76 | HR 59 | Ht 66.0 in | Wt 210.2 lb

## 2023-11-15 DIAGNOSIS — I10 Essential (primary) hypertension: Secondary | ICD-10-CM

## 2023-11-15 DIAGNOSIS — Z87898 Personal history of other specified conditions: Secondary | ICD-10-CM

## 2023-11-15 DIAGNOSIS — R002 Palpitations: Secondary | ICD-10-CM | POA: Diagnosis not present

## 2023-11-15 DIAGNOSIS — Z853 Personal history of malignant neoplasm of breast: Secondary | ICD-10-CM

## 2023-11-15 DIAGNOSIS — R55 Syncope and collapse: Secondary | ICD-10-CM | POA: Diagnosis not present

## 2023-11-15 DIAGNOSIS — R600 Localized edema: Secondary | ICD-10-CM | POA: Diagnosis not present

## 2023-11-15 DIAGNOSIS — I2510 Atherosclerotic heart disease of native coronary artery without angina pectoris: Secondary | ICD-10-CM | POA: Diagnosis not present

## 2023-11-15 DIAGNOSIS — G4733 Obstructive sleep apnea (adult) (pediatric): Secondary | ICD-10-CM | POA: Diagnosis not present

## 2023-11-15 MED ORDER — METOPROLOL SUCCINATE ER 25 MG PO TB24: ORAL_TABLET | ORAL | 3 refills | Status: DC

## 2023-11-15 NOTE — Progress Notes (Signed)
 Cardiology Office Note    Date:  11/20/2023   ID:  Rosalia, Mcavoy 05-11-1946, MRN [id number]  PCP:  Mirna Mires, MD  Cardiologist:  Nicki Guadalajara, MD   14 month follow-up cardiology/sleep office visit   History of Present Illness:  Aliannah [place] is a 78 y.o. female who had remotely seen Dr. Eden Emms in 2014.  I saw her for initial evaluation with me on June 14, 2020 and last saw her on July 01, 2022.  She presents for a 31-month follow-up evaluation..    Ms. Bartles has a history of hypertension, hyperlipidemia, GERD, hypothyroidism, breast and renal cell CA and is status post right mastectomy.  She has a history of mild obstructive sleep apnea not on therapy and has experienced episodes of atypical chest pain.  She was admitted to the hospital in June 2021 with constant left lateral upper pectoral infra chest pain inferior to her shoulder.  Her pain was constant and at times waxed and waned in intensity.  It was not exertionally precipitated.  She presented to the emergency room.  Her EKG was negative for ischemic change.  Troponins were negative.  Laboratory was notable for hypokalemia with potassium of 2.9.  Her ECG shows sinus rhythm with left axis deviation without ectopy or ST-T changes.  D-dimer was upper normal.  It was felt that her chest pain was noncardiac in etiology.  I had seen her for consultation in the hospital and recommended initial noninvasive assessment with coronary CTA I recommended discontinuance of hydrochlorothiazide.  An echo Doppler study on March 05, 2020 showed an EF of 65% without wall motion abnormalities.  There was grade 1 diastolic dysfunction.  There were normal pulmonary pressures.  There was no significant valvular abnormality.  A coronary CTA showed a calcium score at 94, representing 74th percentile for age and sex matched control.  There was mild coronary plaque in the LAD of 0 to 24%.  There was heavy calcification in the RCA creating a  potential blooming artifact.  FFR analysis was normal.  Ms. Hardie Pulley subsequently saw a Theodore Demark in the office for follow-up evaluation.  I saw her for my initial evaluation in the office on June 14, 2020.  At that time, she was experiencing occasional  left-sided chest wall tenderness.  She denied any exertional chest pain.  She developed Covid in June/July 2020.  Subsequently she did not get vaccinated.  At times she had mild swelling in her legs and has been wearing support stockings.  She sees Dr. Mirna Mires for primary care.  During that evaluation, she had costochondral tenderness which mimicked her chest pain consistent with musculoskeletal etiology.  I saw her in July 17, 2021.  She has continued to be evaluated by Dr. Ellin Saba at [facility] oncology center.  She denies any exertional chest pain.  At times she notes a rare sensation of a pulling sensation across her chest which is nonexertional.  She does experience some mild shortness of breath with walking.  She has noted some mild ankle swelling.  She is on a regimen consisting of carvedilol 3.125 mg twice a day, and HCTZ 25 mg daily.  She is no longer on amlodipine for hypertension.  She is on levothyroxine 88 mcg for hypothyroidism, takes omeprazole for GERD, and sertraline.  During that evaluation, with her exertional dyspnea I recommended she undergo an echo Doppler study.  Due to her ankle swelling HCTZ was discontinued and she was started on furosemide 20  mg daily.  Lipid studies in September 2021 showed an LDL cholesterol of 69 on atorvastatin 20 mg.  She represented to our office on August 07, 2021 and was seen by Eligha Bridegroom, NP.  Her lower extremity edema had not improved since switching to furosemide and also admitted to experiencing orthopnea, PND and palpitations without syncope.  She was avoiding sodium intake.  Compression stockings were recommended.  She was instructed to double her furosemide dose for 3 days  with supplemental potassium.  Because of palpitations a 14-day monitor was recommended which showed predominant sinus rhythm with an average heart rate of 73 bpm.  The slowest heart rate was sinus bradycardia 53 bpm the fastest heart rate was 174 bpm which was a 4 beat episode of SVT.  She had 1 run of nonsustained ventricular tachycardia lasting 4 beats with a maximum rate at 156 bpm (average 134) and there were a total of 17 short episodes of SVT with the longest interval being 20 beats at an average rate at 128 bpm.  There were isolated PACs and atrial couplets and rare isolated PVC.  I saw her in follow-up on September 12, 2021.  At that time she was still experiencing some occasional palpitations and had lower extremity edema.  She has been taking the furosemide 20 mg daily after having taken 3 days of the higher dose.  She continues to be on carvedilol 3.125 mg twice a day, levothyroxine 88 mcg and is on atorvastatin 20 mg for hyperlipidemia.  During his brief episodes of SVT in 1 run of nonsustained VT lasting 4 beats I recommended discontinuance of carvedilol and initiated metoprolol tartrate 25 mg twice a day with potential need for further titration.  With continued edema I recommended further titration of furosemide to 40 mg.    She underwent a 2D echo Doppler study ordered by Clair Gulling, NP on December 10, 2021.  This revealed normal systolic function with EF at 55 to 60% and grade 1 diastolic dysfunction.  There was a very small pericardial effusion posterior to the heart.  She had normal pulmonary artery systolic pressure.  I last saw her on December 12, 2021 at which time she denied any chest pain but was continuing to experience lower extremity edema despite taking furosemide 60 mg daily.  Her palpitations had improved and she continued to be on metoprolol tartrate 25 mg twice a day.  She was on  levothyroxine 100 mcg for hypothyroidism.  She is on atorvastatin 20 mg for hyperlipidemia.  She was  not sleeping well and was aware of snoring, daytime sleepiness, fatigue, and her sleep is nonrestorative.  An Epworth Sleepiness Scale score was calculated in the office which was elevated at 14 consistent with excessive daytime sleepiness.  I recommended she have another sleep evaluation.`  She underwent a sleep study on Jan 26, 2022 at [facility] which showed moderate sleep apnea with an AHI of 15.2/h.  Events were severe during REM sleep with an AHI of 38.8/h and with supine position at 42/h.  She had significant oxygen desaturation to a nadir of 78% and there was moderate snoring noted during the study.  On Feb 11, 2022 a CPAP titration study was performed and it was recommended that she initiate CPAP auto therapy with an EPR of 3 at 8 to 13 cm water pressure range.  I last saw her on July 01, 2022.  Her CPAP set up date was April 08, 2022 with [facility] as her DME  company.  A download was obtained from July 19 through May 07, 2022.  She is meeting compliance standards with 93% usage days and average use at 6 hours 57 minutes.  AHI was 5.9 with 95th percentile pressure 12.2 maximum average pressure 12.5.  Central apnea index was 1.8.  A subsequent download from September 11 through June 30, 2022 shows 100% use with average use now at 7 hours and 6 minutes.  AHI is 5.1 with 95th percentile pressure 12.3 with maximum average pressure 12.8.  She typically goes to bed between 9 and 11 PM and wakes up at 6 AM.  Her sleep is improved.  She is unaware of any breakthrough snoring.  An Epworth scale was recalculated in the office today and this endorsed at 11 suggestive of mild residual daytime sleepiness.  During that evaluation I recommended slight adjustment to her CPAP settings and changed her pressure range to 9 to 15 cm of water and increase ramp start pressure to 60 mm.  With low blood pressure I recommended reduction in her metoprolol dose to take 25 mg in the morning but reduce dose to 12.5  mg at night.  Since I last saw her, she was admitted to [facility] in April 2024 with a syncopal episode.  She underwent an echo Doppler study in April 2024.  EF was 65 to 70%.  She had grade 1 diastolic dysfunction.  There was a small pericardial effusion.  She was evaluated by Dr. Steffanie Dunn on May 04, 2023 after previously been seen by [place] in July 2024.  It was felt that her syncope was secondary to dehydration and workup was negative for other causes.  He felt most likely her syncope was vagally mediated and did not feel it was due to arrhythmic etiology.  He had recommended she undergo cardiac MRI in light of low voltage seen on twelve-lead ECG to make certain there was no suggestion of amyloidosis.  Nuclear med PET CT cardiac perfusion study was done on May 20, 2023 which was normal without evidence for ischemia.  Rest EF was 74% with stress EF at 76%.  Global myocardial blood flow was normal.  There was evidence for coronary calcification in LAD and RCA.  She was evaluated by Bernadene Person, NP on September 27, 2023.  She was felt to be cardiac stable although she continued to note intermittent nonexertional chest discomfort described as a pinching sensation unchanged from prior visits.  Presently, Ms. Hardie Pulley feels well.  She has continued to use CPAP.  A download was obtained from January 22 through November 11, 2023.  Compliance is excellent with 100% use averaging 7 hours and 11 minutes.  And a pressure range of 9 to 15 cm of water, AHI is excellent at 1.4.  95th percentile pressure is 12.8 with maximum average pressure 14.1.  She does note some mild ankle edema.  She continues to be on metoprolol succinate 37.5 mg daily for blood pressure and palpitations.  However despite this she still admits to experiencing palpitations most of the time.  She is on atorvastatin 20 mg for hyperlipidemia.  She takes levothyroxine 150 mcg for hypothyroidism.  She is on gabapentin for  neuropathy and omeprazole for GERD.  She presents for evaluation.  Past Medical History:  Diagnosis Date   Allergic rhinitis due to pollen    Anxiety    Arthritis    "knees, back, right elbow; left shoulder; right ankle" (11/20/2014)   Breast cancer, right breast (  HCC) 2016   "DCIS; zero stage" S/P mastectomy, cancer free since then   Cervical spondylosis without myelopathy    Cervicalgia    Chronic lower back pain    Constipation    takes Miralax daily   Degeneration of cervical intervertebral disc    Depression    Diverticulosis of colon (without mention of hemorrhage)    GERD (gastroesophageal reflux disease)    takes Omeprazole daily   Graves' disease    "took a pill to correct"   H/O hiatal hernia    H/O urinary frequency    Headache(784.0)    denies migraines since the 80's but has occ and takes Butalbital prn   HTN (hypertension)    takes Metoprolol and Lisinopril daily   Insomnia    takes Ativan nigtly   Joint pain    "all of them"   Joint swelling    "knees, legs, ankles sometimes" (11/20/2014)   Morbid obesity (HCC)    Numbness    LOWER LEGS   Other postablative hypothyroidism    takes SYnthroid daily   Palpitations    Peptic ulcer, unspecified site, unspecified as acute or chronic, without mention of hemorrhage, perforation, or obstruction    Primary localized osteoarthrosis, lower leg    Pure hypercholesterolemia    takes Pravastin daily   Recurrent UTI    Renal cell carcinoma (HCC) 01/08/2016   Right renal mass s/p partial right nephrectomy, cancer free since 2017   Shortness of breath    Sleep apnea    slight but doesn't require a CPAP (11/20/2014)   Tendonitis of knee    bilateral    Past Surgical History:  Procedure Laterality Date   ABDOMINAL HYSTERECTOMY     ANTERIOR LAT LUMBAR FUSION  05/13/2012   Procedure: ANTERIOR LATERAL LUMBAR FUSION 2 LEVELS;  Surgeon: Reinaldo Meeker, MD;  Location: [facility];  Service: Neurosurgery;  Laterality: Left;   Left Lumbar Three-four,Lumbar four-five Extreme Lumbar Interbody Fusion with Percutaneous Pedicle Screws   APPENDECTOMY     BREAST BIOPSY Left ~ 2014   BREAST BIOPSY Right 2015   BREAST LUMPECTOMY Left 1988   BREAST RECONSTRUCTION WITH PLACEMENT OF TISSUE EXPANDER AND FLEX HD (ACELLULAR HYDRATED DERMIS) Right 11/20/2014   BREAST RECONSTRUCTION WITH PLACEMENT OF TISSUE EXPANDER AND FLEX HD (ACELLULAR HYDRATED DERMIS) Right 11/20/2014   Procedure: RIGHT BREAST RECONSTRUCTION WITH PLACEMENT OF TISSUE EXPANDER AND ACELLULAR DERMA MATRIX (ACELLULAR DERMA MATRIX);  Surgeon: Etter Sjogren, MD;  Location: [facility] OR;  Service: Plastics;  Laterality: Right;   CATARACT EXTRACTION W/ INTRAOCULAR LENS  IMPLANT, BILATERAL Bilateral    COLONOSCOPY  Sept 2008   RMR: normal rectum, left-sided diverticula, repeat in 2018   COLONOSCOPY WITH PROPOFOL N/A 03/21/2018   Dr. Jena Gauss: diverticulosis, internal grade I hemorrhoids. no future colonoscopies unless develops new symptoms   DIAGNOSTIC LAPAROSCOPY     DILATION AND CURETTAGE OF UTERUS     ESOPHAGOGASTRODUODENOSCOPY     ESOPHAGOGASTRODUODENOSCOPY  2013   Dr. Jena Gauss: Cervical esophageal web and Schatzki's ring s/p dilation, small hiatal hernia, antral and duodenal erosions likely NSAID effect, chronic duodenitis on path    ESOPHAGOGASTRODUODENOSCOPY (EGD) WITH PROPOFOL N/A 03/21/2018   Dr. Jena Gauss: mild Schatzki ring s/p dilation   FOREIGN BODY REMOVAL Right 10/06/2013   Procedure: REMOVAL FOREIGN BODY EXTREMITY;  Surgeon: Vickki Hearing, MD;  Location: [facility];  Service: Orthopedics;  Laterality: Right;   INJECTION KNEE Left 10/06/2013   Procedure: KNEE INJECTION;  Surgeon: Vickki Hearing,  MD;  Location: [facility];  Service: Orthopedics;  Laterality: Left;   JOINT REPLACEMENT Bilateral    knees   KNEE ARTHROSCOPY Right    KNEE ARTHROSCOPY WITH LATERAL MENISECTOMY Right 10/06/2013   Procedure: KNEE ARTHROSCOPY WITH LATERAL AND MEDIAL MENISECTOMY;  Surgeon: Vickki Hearing, MD;  Location: [facility];  Service: Orthopedics;  Laterality: Right;  END @ 1234   lumpectomy on pelvis     "mass of nerves"   MALONEY DILATION N/A 03/21/2018   Procedure: MALONEY DILATION;  Surgeon: Corbin Ade, MD;  Location: [facility];  Service: Endoscopy;  Laterality: N/A;   MASTECTOMY COMPLETE / SIMPLE W/ SENTINEL NODE BIOPSY Right 11/20/2014   axillary   REMOVAL OF TISSUE EXPANDER AND PLACEMENT OF IMPLANT Right 12/13/2014   Procedure: REMOVAL OF RIGHT BREAST TISSUE EXPANDER;  Surgeon: Etter Sjogren, MD;  Location: [facility] OR;  Service: Plastics;  Laterality: Right;   ROBOTIC ASSITED PARTIAL NEPHRECTOMY Right 01/08/2016   Procedure: XI ROBOTIC ASSITED PARTIAL NEPHRECTOMY;  Surgeon: Sebastian Ache, MD;  Location: [facility];  Service: Urology;  Laterality: Right;   SIMPLE MASTECTOMY WITH AXILLARY SENTINEL NODE BIOPSY Right 11/20/2014   Procedure: SIMPLE MASTECTOMY WITH AXILLARY SENTINEL NODE BIOPSY;  Surgeon: Harriette Bouillon, MD;  Location[facility] OR;  Service: General;  Laterality: Right;   TISSUE EXPANDER REMOVAL Right 12/13/2014   dr Odis Luster   TONSILLECTOMY     TOTAL KNEE ARTHROPLASTY Right 01/24/2014   Procedure: TOTAL KNEE ARTHROPLASTY;  Surgeon: Vickki Hearing, MD;  Location: [facility];  Service: Orthopedics;  Laterality: Right;   TOTAL KNEE ARTHROPLASTY Left 08/08/2014   Procedure: TOTAL KNEE ARTHROPLASTY;  Surgeon: Vickki Hearing, MD;  Location: [facility];  Service: Orthopedics;  Laterality: Left;   TUBAL LIGATION     UPPER GASTROINTESTINAL ENDOSCOPY      Current Medications: Outpatient Medications Prior to Visit  Medication Sig Dispense Refill   acetaminophen (TYLENOL) 500 MG tablet Take 650 mg by mouth as needed for moderate pain or headache.     aspirin EC 81 MG tablet Take 81 mg by mouth daily. Swallow whole.     dapagliflozin propanediol (FARXIGA) 5 MG TABS tablet Take 5 mg by mouth every morning.     gabapentin (NEURONTIN) 600 MG tablet Take 1 tablet (600 mg total) by mouth 2  (two) times daily. 180 tablet 2   levothyroxine (SYNTHROID) 150 MCG tablet Take 1 tablet (150 mcg total) by mouth daily.     Multiple Vitamins-Minerals (CENTRUM SILVER PO) Take 1 tablet by mouth daily.     omeprazole (PRILOSEC) 20 MG capsule Take 20 mg by mouth daily. May take a second 20 mg dose as needed for heartburn     oxybutynin (DITROPAN) 5 MG tablet Take 5 mg by mouth 2 (two) times daily.      potassium chloride (KLOR-CON) 20 MEQ packet Take 20 mEq by mouth 2 (two) times daily.     torsemide (DEMADEX) 20 MG tablet Take 3 tablets (60 mg total) by mouth daily. 270 tablet 3   Vitamin D, Ergocalciferol, (DRISDOL) 1.25 MG (50000 UNIT) CAPS capsule TAKE 1 CAPSULE BY MOUTH WEEKLY 15 capsule 2   metoprolol succinate (TOPROL-XL) 25 MG 24 hr tablet Take 1.5 tablets (37.5 mg total) by mouth daily. 135 tablet 3   atorvastatin (LIPITOR) 20 MG tablet Take 1 tablet (20 mg total) by mouth daily. 30 tablet 11   Magnesium 400 MG CAPS Take 400 mg by mouth daily. (Patient not taking: Reported on  11/15/2023)     No facility-administered medications prior to visit.     Allergies:   Propoxyphene hcl, Pollen extract, Camphor, Dust mite extract, Latex, Molds & smuts, and Tomato   Social History   Socioeconomic History   Marital status: Married    Spouse name: Shon Hale   Number of children: 4   Years of education: Not on file   Highest education level: Not on file  Occupational History   Occupation: disability    Employer: UNEMPLOYED    Comment: since 2008, knee surgery  Tobacco Use   Smoking status: Former    Current packs/day: 0.00    Average packs/day: 1 pack/day for 27.0 years (27.0 ttl pk-yrs)    Types: Cigarettes    Start date: 12/27/1958    Quit date: 12/26/1985    Years since quitting: 37.9   Smokeless tobacco: Never   Tobacco comments:    Quit in 1987  Vaping Use   Vaping status: Never Used  Substance and Sexual Activity   Alcohol use: Yes    Alcohol/week: 0.0 standard drinks of alcohol     Comment: occasional wine   Drug use: No   Sexual activity: Yes    Partners: Male    Birth control/protection: Surgical    Comment: married  Other Topics Concern   Not on file  Social History Narrative   Currently married for 10 years. Husband's name is Shon Hale. She is an excellent singer. 8 children between she and her husband. 23 grand children. 12 great grandchildren.   Social Drivers of Corporate investment banker Strain: Not on file  Food Insecurity: No Food Insecurity (12/20/2022)   Hunger Vital Sign    Worried About Running Out of Food in the Last Year: Never true    Ran Out of Food in the Last Year: Never true  Transportation Needs: No Transportation Needs (12/20/2022)   PRAPARE - Administrator, Civil Service (Medical): No    Lack of Transportation (Non-Medical): No  Physical Activity: Not on file  Stress: Not on file  Social Connections: Not on file     Family History:  The patient's family history includes Heart attack in her father, sister, sister, and sister; Lung cancer in her maternal grandmother; Obesity in her brother; Osteoarthritis in her brother and sister; Stroke in her sister.   ROS General: Negative; No fevers, chills, or night sweats;  HEENT: Negative; No changes in vision or hearing, sinus congestion, difficulty swallowing Pulmonary: Negative; No cough, wheezing, shortness of breath, hemoptysis Cardiovascular: See HPI GI: Negative; No nausea, vomiting, diarrhea, or abdominal pain GU: Negative; No dysuria, hematuria, or difficulty voiding Musculoskeletal: Negative; no myalgias, joint pain, or weakness Hematologic/Oncology: History of breast cancer, status post right mastectomy.  History of renal cancer. Endocrine: Negative; no heat/cold intolerance; no diabetes Neuro: Negative; no changes in balance, headaches Skin: Negative; No rashes or skin lesions Psychiatric: Negative; No behavioral problems, depression Sleep: See HPI Other comprehensive 14  point system review is negative.   PHYSICAL EXAM:   VS:  BP 126/76   Pulse (!) 59   Ht 5\' 6"  (1.676 m)   Wt 210 lb 3.2 oz (95.3 kg)   SpO2 93%   BMI 33.93 kg/m     Repeat blood pressure by me was 122/78.  Wt Readings from Last 3 Encounters:  11/15/23 210 lb 3.2 oz (95.3 kg)  11/04/23 209 lb (94.8 kg)  09/27/23 206 lb 9.6 oz (93.7 kg)    General:  Alert, oriented, no distress.  Skin: normal turgor, no rashes, warm and dry HEENT: Normocephalic, atraumatic. Pupils equal round and reactive to light; sclera anicteric; extraocular muscles intact;  Nose without nasal septal hypertrophy Mouth/Parynx benign; Mallinpatti scale 4 Neck: No JVD, no carotid bruits; normal carotid upstroke Lungs: clear to ausculatation and percussion; no wheezing or rales Chest wall: without tenderness to palpitation Heart: PMI not displaced, RRR, s1 s2 normal, 1/6 systolic murmur, no diastolic murmur, no rubs, gallops, thrills, or heaves Abdomen: soft, nontender; no hepatosplenomehaly, BS+; abdominal aorta nontender and not dilated by palpation. Back: no CVA tenderness Pulses 2+ Musculoskeletal: full range of motion, normal strength, no joint deformities Extremities: Improvement in prior 1-2+ lower extremity edema, now minimal; no clubbing cyanosis or edema, Homan's sign negative  Neurologic: grossly nonfocal; Cranial nerves grossly wnl Psychologic: Normal mood and affect    Studies/Labs Reviewed:   EKG Interpretation Date/Time:  Monday November 15 2023 11:16:07 EST Ventricular Rate:  59 PR Interval:  144 QRS Duration:  72 QT Interval:  430 QTC Calculation: 425 R Axis:   -37  Text Interpretation: Sinus bradycardia with Premature supraventricular complexes and Premature ventricular complexes or Fusion complexes Possible Left atrial enlargement Left axis deviation Possible Lateral infarct (cited on or before 27-Sep-2023) When compared with ECG of 27-Sep-2023 15:01, Fusion complexes are now Present  Premature ventricular complexes are now Present Premature supraventricular complexes are now Present Confirmed by Nicki Guadalajara ([id number]) on 11/20/2023 9:45:59 AM    October 11, 2023ECG (independently read by me): Sinus bradycardia at 59, LAD PRWP  December 12, 2021 ECG (independently read by me):  NSR at 64; LAD, normal intervals  September 12, 2022 ECG (independently read by me):  NSR at 73; biatrial enlargement ; LAD; QTc 467  July 17, 2021 ECG (independently read by me):  NSR at 73, LAE, LAD, Reduced anteriorly  September 24, 20212 ECG (independently read by me): Normal sinus rhythm at 62 bpm.  Possible left atrial enlargement.  Low voltage anteriorly.  Recent Labs:    Latest Ref Rng & Units 10/25/2023    2:01 PM 01/14/2023   11:24 PM 12/22/2022    4:39 AM  BMP  Glucose 70 - 99 mg/dL 914  782  95   BUN 8 - 23 mg/dL 15  12  15    Creatinine 0.44 - 1.00 mg/dL 9.56  2.13  0.86   Sodium 135 - 145 mmol/L 142  139  137   Potassium 3.5 - 5.1 mmol/L 3.8  3.2  3.6   Chloride 98 - 111 mmol/L 99  97  105   CO2 22 - 32 mmol/L 29  32  26   Calcium 8.9 - 10.3 mg/dL 8.7  8.3  7.7         Latest Ref Rng & Units 10/25/2023    2:01 PM 12/21/2022    4:12 AM 12/20/2022    7:49 PM  Hepatic Function  Total Protein 6.5 - 8.1 g/dL 6.3  4.8  5.5   Albumin 3.5 - 5.0 g/dL 2.4  1.8  2.3   AST 15 - 41 U/L 47  35  50   ALT 0 - 44 U/L 28  25  32   Alk Phosphatase 38 - 126 U/L 71  66  81   Total Bilirubin 0.0 - 1.2 mg/dL 0.7  0.5  0.6        Latest Ref Rng & Units 10/25/2023    2:01 PM 01/14/2023   11:24 PM 12/22/2022  4:39 AM  CBC  WBC 4.0 - 10.5 K/uL 8.5  8.6  5.5   Hemoglobin 12.0 - 15.0 g/dL 16.1  09.6  04.5   Hematocrit 36.0 - 46.0 % 43.0  42.3  37.3   Platelets 150 - 400 K/uL 241  241  211    Lab Results  Component Value Date   MCV 96.6 10/25/2023   MCV 98.6 01/14/2023   MCV 98.4 12/22/2022   Lab Results  Component Value Date   TSH 1.504 03/04/2020   No results found for: "HGBA1C"   BNP     Component Value Date/Time   BNP 33.0 10/27/2022 1536    ProBNP No results found for: "PROBNP"   Lipid Panel     Component Value Date/Time   CHOL 141 06/07/2020 0833   TRIG 71 06/07/2020 0833   HDL 57 06/07/2020 0833   CHOLHDL 2.5 06/07/2020 0833   VLDL 14 03/06/2020 0420   LDLCALC 69 06/07/2020 0833     RADIOLOGY: MM 3D SCREEN BREAST UNI LEFT Result Date: 10/27/2023 CLINICAL DATA:  Screening. Personal history of malignant RIGHT mastectomy in 2016. EXAM: DIGITAL SCREENING UNILATERAL LEFT MAMMOGRAM WITH CAD AND TOMOSYNTHESIS TECHNIQUE: Left screening digital craniocaudal and mediolateral oblique mammograms were obtained. Left screening digital breast tomosynthesis was performed. The images were evaluated with computer-aided detection. COMPARISON:  Previous exam(s). ACR Breast Density Category b: There are scattered areas of fibroglandular density. FINDINGS: The patient has had a right mastectomy. There are no findings suspicious for malignancy in the LEFT breast. IMPRESSION: No mammographic evidence of malignancy. A result letter of this screening mammogram will be mailed directly to the patient. RECOMMENDATION: Screening mammogram in one year.  (Code:[id number]-26M) BI-RADS CATEGORY  1: Negative. Electronically Signed   By: Hulan Saas M.D.   On: 10/27/2023 16:33     Additional studies/ records that were reviewed today include:  I reviewed the records from the patient's recent hospitalization as well as follow-up office visit with Theodore Demark, PA  ECHO: 07/31/2021 IMPRESSIONS   1. Left ventricular ejection fraction, by estimation, is 55 to 60%. Left  ventricular ejection fraction by 3D volume is 57 %. The left ventricle has  normal function. The left ventricle has no regional wall motion  abnormalities. Left ventricular diastolic   parameters were normal.   2. Right ventricular systolic function is normal. The right ventricular  size is normal. There is normal pulmonary artery  systolic pressure. The  estimated right ventricular systolic pressure is 28.4 mmHg.   3. The pericardial effusion is posterior to the left ventricle and  localized near the right atrium.   4. The mitral valve is normal in structure. Trivial mitral valve  regurgitation. No evidence of mitral stenosis.   5. The aortic valve is tricuspid. Aortic valve regurgitation is not  visualized. Mild aortic valve sclerosis is present, with no evidence of  aortic valve stenosis.   6. The inferior vena cava is normal in size with greater than 50%  respiratory variability, suggesting right atrial pressure of 3 mmHg.   ---------------------------------------------------------------------------------------------------------------------     Patient Name: Michala, Deblanc Study Date: 02/11/2022 Gender: Female D.O.B: October 28, 1945 Age (years): 32 Referring Provider: Nicki Guadalajara MD, ABSM Height (inches): 66 Interpreting Physician: Nicki Guadalajara MD, ABSM Weight (lbs): 229 RPSGT: Alfonso Ellis BMI: 37 MRN: [id number] Neck Size: 15.50   CLINICAL INFORMATION The patient is referred for a CPAP titration to treat sleep apnea.   Date of NPSG:  01/26/2022: AHI: 15.2/h; REM AHI 38.8/h: supine  AHI 42.0/h; O2 nadir 78%.   SLEEP STUDY TECHNIQUE As per the AASM Manual for the Scoring of Sleep and Associated Events v2.3 (April 2016) with a hypopnea requiring 4% desaturations.   The channels recorded and monitored were frontal, central and occipital EEG, electrooculogram (EOG), submentalis EMG (chin), nasal and oral airflow, thoracic and abdominal wall motion, anterior tibialis EMG, snore microphone, electrocardiogram, and pulse oximetry. Continuous positive airway pressure (CPAP) was initiated at the beginning of the study and titrated to treat sleep-disordered breathing.   MEDICATIONS acetaminophen (TYLENOL) 500 MG tablet aspirin EC 81 MG tablet atorvastatin (LIPITOR) 20 MG tablet (Expired) diphenhydrAMINE  (BENADRYL) 50 MG capsule ergocalciferol (VITAMIN D2) 1.25 MG (50000 UT) capsule gabapentin (NEURONTIN) 600 MG tablet levothyroxine (SYNTHROID) 100 MCG tablet metoprolol tartrate (LOPRESSOR) 25 MG tablet omeprazole (PRILOSEC) 20 MG capsule oxybutynin (DITROPAN) 5 MG tablet potassium chloride (KLOR-CON) 10 MEQ tablet predniSONE (DELTASONE) 10 MG tablet sertraline (ZOLOFT) 50 MG tablet torsemide (DEMADEX) 20 MG tablet Medications self-administered by patient taken the night of the study : N/A   TECHNICIAN COMMENTS Comments added by technician: CPAP therapy started at 4 cm of H2O. Titration increased to 8 CWP with an EPR of 2. Patient tolerated CPAP pressure fairly well. Central-like events noted to increase in final pressure of 8 CWP at times in supine position. EPR of 2 was applied, but not seen in supine position Comments added by scorer: N/A   RESPIRATORY PARAMETERS Optimal PAP Pressure (cm):  8          AHI at Optimal Pressure (/hr):            2.8 Overall Minimal O2 (%):         84.00   Supine % at Optimal Pressure (%):    62 Minimal O2 at Optimal Pressure (%): 87.0        SLEEP ARCHITECTURE The study was initiated at 10:10:01 PM and ended at 6:03:21 AM.   Sleep onset time was 53.8 minutes and the sleep efficiency was 80.9%. The total sleep time was 383 minutes.   The patient spent 4.44% of the night in stage N1 sleep, 59.27% in stage N2 sleep, 16.45% in stage N3 and 19.8% in REM.Stage REM latency was 168.5 minutes   Wake after sleep onset was 36.6. Alpha intrusion was absent. Supine sleep was 51.78%.   CARDIAC DATA The 2 lead EKG demonstrated sinus rhythm. The mean heart rate was 61.79 beats per minute. Other EKG findings include: None.   LEG MOVEMENT DATA The total Periodic Limb Movements of Sleep (PLMS) were 0. The PLMS index was 0.00. A PLMS index of <15 is considered normal in adults.   IMPRESSIONS - CPAP was initiated at 4 cm and was titrated to 8 cm of water (AHI 2.1/h;  O2 nadir 87%) - Central sleep apnea was not noted during this titration (CAI = 1.9/h). - Moderate oxygen desaturations to a nadir of 84%. - The patient snored with soft snoring volume during this titration study. - No cardiac abnormalities were observed during this study. - Clinically significant periodic limb movements were not noted during this study. Arousals associated with PLMs were rare.   DIAGNOSIS - Obstructive Sleep Apnea (G47.33)   RECOMMENDATIONS - Recommend an initial trial of CPAP Auto therapy with EPR of 3 at 8 -13 cm H2O with heated humidification. A medium size Resmed Nasal Airfit N20 mask was used for the titration. - Effort should be made to otpimize nasal and oropharyngeal patency. - Avoid alcohol, sedatives  and other CNS depressants that may worsen sleep apnea and disrupt normal sleep architecture. - Sleep hygiene should be reviewed to assess factors that may improve sleep quality. - Weight management and regular exercise should be initiated or continued. - Recommend a download and sleep clinic evaluation after 4 weeks of therapy. ASSESSMENT:    1. Essential hypertension   2. OSA (obstructive sleep apnea) on CPAP   3. Palpitations   4. Lower extremity edema   5. History of syncope   6. Coronary artery calcification   7. History of breast cancer   8. Syncope, presumed vasovagal     PLAN:  Ms. Mchan is a pleasant 78year-old female who has a history of hypertension,  hyperlipidemia, hypothyroidism, GERD, previous mild OSA not on therapy.  In the past, she has experienced episodes of atypical chest pain, mostly of musculoskeletal etiology.  A coronary CTA showed mild disease and I suspect there was blooming artifact in the RCA.  FFR analysis was normal.  On prior evaluation she has experienced costochondral tenderness.  She has experienced lower extremity edema which continued despite HCTZ leading to switching to furosemide.  Previously, she was on furosemide which was  later switched to torsemide.  She has had issues of palpitations in the past and a 14-day monitor had shown predominant sinus rhythm with several episodes of short SVT and 1 episode of nonsustained VT lasting 4 beats at a rate of 156.  Palpitations significantly improved with the change from carvedilol to metoprolol.  When last seen in March 2023 I discussed the importance of sodium restriction particularly with her lower extremity edema.  At that time I was concerned of progressive obstructive sleep apnea.  Subsequent sleep evaluation in May 2023 confirmed moderate overall sleep apnea with an AHI 15.2 but sleep apnea was severe during REM sleep with an AHI of 38.8 and with supine position with an AHI of 42.0.  She had significant oxygen desaturation to a nadir of 78%.  She has been on CPAP therapy since her set up date of April 08, 2022 and is meeting compliance standards on her subsequent evaluations.  She continues to have mild residual sleep apnea with an AHI of 5.1 with a central index of 2.0.  She continues also to have some mild residual daytime sleepiness.  At her last evaluation with me in October 2023, I recommended slight adjustment to her CPAP setting and will change a pressure range to 9 to 15 cm of water and increase her ramp start pressure to 6 cm.  Since I have seen her, she had experienced an episode of syncope leading to hospitalization at [facility] in April 2024.  An echo Doppler study was reviewed which showed EF 65 to 70% with grade 1 diastolic dysfunction.  There was a very small pericardial effusion.  She subsequently has undergone cardiac MRI due to low voltage on her ECG and this was essentially normal with normal perfusion.  Rest EF was 74% with stress EF 76%.  She had normal myocardial blood flow.  There was evidence for mild coronary calcification in the LAD and RCA.  She has continued to use CPAP with excellent compliance.  I reviewed her most recent download.  AHI is excellent at 1.4  with a pressure set at a range of 9 to 15 cm of water.  95th percentile pressure is 12.8 with maximum average pressure 14.1.  I have suggested minimal change in her CPAP pressures to a range of 10 to 16  cm of water.  She continues to experience occasional palpitations despite her current dose of metoprolol and I have suggested that she try slight adjustment of metoprolol succinate back to 37.5 mg in the morning with the addition of 12.5 mg at bedtime.  She continues to be on torsemide 60 mg daily and still has lower extremity ankle edema.  I have suggested support stockings with 20 to 30 mm compression support.  I discussed with her my plans for retirement this year.  She continues to be on levothyroxine 150 mcg for hypothyroidism and is on Farxiga 5 mg for diabetes.  She continues to be followed by Dr. Ellin Saba with her history of breast CVA.  Since he lives in the [place] area, I will transition her to our cardiologist in [place] and to see Dr. Ancil Boozer in 6 months or sooner as needed.   Medication Adjustments/Labs and Tests Ordered: Current medicines are reviewed at length with the patient today.  Concerns regarding medicines are outlined above.  Medication changes, Labs and Tests ordered today are listed in the Patient Instructions below. Patient Instructions  Medication Instructions:  Continue to Take the Metoprolol Succinate 1 1/2 tablet in the morning (37.5 mg) and take 1/2 (12.5mg ) tablet at night.   *If you need a refill on your cardiac medications before your next appointment, please call your pharmacy*   Lab Work: No labs were ordered during today's visit.  If you have labs (blood work) drawn today and your tests are completely normal, you will receive your results only by: MyChart Message (if you have MyChart) OR A paper copy in the mail If you have any lab test that is abnormal or we need to change your treatment, we will call you to review the  results.   Testing/Procedures: No procedures ordered today.    Follow-Up: At [facility], you and your health needs are our priority.  As part of our continuing mission to provide you with exceptional heart care, we have created designated Provider Care Teams.  These Care Teams include your primary Cardiologist (physician) and Advanced Practice Providers (APPs -  Physician Assistants and Nurse Practitioners) who all work together to provide you with the care you need, when you need it.  We recommend signing up for the patient portal called "MyChart".  Sign up information is provided on this After Visit Summary.  MyChart is used to connect with patients for Virtual Visits (Telemedicine).  Patients are able to view lab/test results, encounter notes, upcoming appointments, etc.  Non-urgent messages can be sent to your provider as well.   To learn more about what you can do with MyChart, go to [email or web address].    Your next appointment:   6 month(s)  Provider:   Luane School, MD    You may see Dr Armanda Magic as needed for sleep concerns  If you have any questions or concerns regarding your c-pap, bi-pap or sleep accessories, please contact Brandie Rorie at [phone].     Other Instructions        1st Floor: - Lobby - Registration  - Pharmacy  - Lab - Cafe   2nd Floor: - PV Lab - Diagnostic Testing (echo, CT, nuclear med)   3rd Floor: - Vacant   4th Floor: - TCTS (cardiothoracic surgery) - AFib Clinic - Structural Heart Clinic - Vascular Surgery  - Vascular Ultrasound   5th Floor: - HeartCare Cardiology (general and EP) - Clinical Pharmacy for coumadin, hypertension, lipid, weight-loss medications, and med management  appointments      Valet parking services will be available as well.         Thank you for choosing [facility]!       Signed, Nicki Guadalajara, MD  11/20/2023 9:55 AM    [facility] [address], [address], [place], [place]  [address] Phone: [phone]

## 2023-11-15 NOTE — Patient Instructions (Signed)
 Medication Instructions:  Continue to Take the Metoprolol Succinate 1 1/2 tablet in the morning (37.5 mg) and take 1/2 (12.5mg ) tablet at night.   *If you need a refill on your cardiac medications before your next appointment, please call your pharmacy*   Lab Work: No labs were ordered during today's visit.  If you have labs (blood work) drawn today and your tests are completely normal, you will receive your results only by: MyChart Message (if you have MyChart) OR A paper copy in the mail If you have any lab test that is abnormal or we need to change your treatment, we will call you to review the results.   Testing/Procedures: No procedures ordered today.    Follow-Up: At [facility], you and your health needs are our priority.  As part of our continuing mission to provide you with exceptional heart care, we have created designated Provider Care Teams.  These Care Teams include your primary Cardiologist (physician) and Advanced Practice Providers (APPs -  Physician Assistants and Nurse Practitioners) who all work together to provide you with the care you need, when you need it.  We recommend signing up for the patient portal called "MyChart".  Sign up information is provided on this After Visit Summary.  MyChart is used to connect with patients for Virtual Visits (Telemedicine).  Patients are able to view lab/test results, encounter notes, upcoming appointments, etc.  Non-urgent messages can be sent to your provider as well.   To learn more about what you can do with MyChart, go to [email or web address].    Your next appointment:   6 month(s)  Provider:   Luane School, MD    You may see Dr Armanda Magic as needed for sleep concerns  If you have any questions or concerns regarding your c-pap, bi-pap or sleep accessories, please contact Brandie Rorie at [phone].     Other Instructions        1st Floor: - Lobby - Registration  - Pharmacy  - Lab -  Cafe   2nd Floor: - PV Lab - Diagnostic Testing (echo, CT, nuclear med)   3rd Floor: - Vacant   4th Floor: - TCTS (cardiothoracic surgery) - AFib Clinic - Structural Heart Clinic - Vascular Surgery  - Vascular Ultrasound   5th Floor: - HeartCare Cardiology (general and EP) - Clinical Pharmacy for coumadin, hypertension, lipid, weight-loss medications, and med management appointments      Valet parking services will be available as well.         Thank you for choosing [facility]!

## 2023-11-20 ENCOUNTER — Encounter: Payer: Self-pay | Admitting: Cardiovascular Disease

## 2024-01-06 ENCOUNTER — Other Ambulatory Visit (HOSPITAL_COMMUNITY): Payer: Self-pay | Admitting: Family Medicine

## 2024-01-06 DIAGNOSIS — R109 Unspecified abdominal pain: Secondary | ICD-10-CM

## 2024-01-14 ENCOUNTER — Ambulatory Visit (HOSPITAL_COMMUNITY)
Admission: RE | Admit: 2024-01-14 | Discharge: 2024-01-14 | Disposition: A | Discharge status: Home / Self Care | Source: Ambulatory Visit | Attending: Family Medicine | Admitting: Family Medicine

## 2024-01-14 DIAGNOSIS — R109 Unspecified abdominal pain: Secondary | ICD-10-CM | POA: Insufficient documentation

## 2024-01-25 ENCOUNTER — Ambulatory Visit: Payer: Self-pay | Admitting: Cardiovascular Disease

## 2024-02-03 ENCOUNTER — Other Ambulatory Visit: Payer: Self-pay | Admitting: Cardiovascular Disease

## 2024-02-08 ENCOUNTER — Other Ambulatory Visit: Payer: Self-pay

## 2024-02-08 ENCOUNTER — Emergency Department (HOSPITAL_COMMUNITY)

## 2024-02-08 ENCOUNTER — Encounter (HOSPITAL_COMMUNITY): Payer: Self-pay | Admitting: *Deleted

## 2024-02-08 ENCOUNTER — Emergency Department (HOSPITAL_COMMUNITY)
Admission: EM | Admit: 2024-02-08 | Discharge: 2024-02-08 | Disposition: A | Discharge status: Home / Self Care | Attending: Emergency Medicine | Admitting: Emergency Medicine

## 2024-02-08 DIAGNOSIS — S0101XA Laceration without foreign body of scalp, initial encounter: Secondary | ICD-10-CM | POA: Diagnosis not present

## 2024-02-08 DIAGNOSIS — S0003XA Contusion of scalp, initial encounter: Secondary | ICD-10-CM | POA: Diagnosis not present

## 2024-02-08 DIAGNOSIS — Z7982 Long term (current) use of aspirin: Secondary | ICD-10-CM | POA: Insufficient documentation

## 2024-02-08 DIAGNOSIS — W01198A Fall on same level from slipping, tripping and stumbling with subsequent striking against other object, initial encounter: Secondary | ICD-10-CM | POA: Diagnosis not present

## 2024-02-08 DIAGNOSIS — Z853 Personal history of malignant neoplasm of breast: Secondary | ICD-10-CM | POA: Insufficient documentation

## 2024-02-08 DIAGNOSIS — N189 Chronic kidney disease, unspecified: Secondary | ICD-10-CM | POA: Diagnosis not present

## 2024-02-08 DIAGNOSIS — I129 Hypertensive chronic kidney disease with stage 1 through stage 4 chronic kidney disease, or unspecified chronic kidney disease: Secondary | ICD-10-CM | POA: Insufficient documentation

## 2024-02-08 DIAGNOSIS — Z981 Arthrodesis status: Secondary | ICD-10-CM | POA: Diagnosis not present

## 2024-02-08 DIAGNOSIS — S0990XA Unspecified injury of head, initial encounter: Secondary | ICD-10-CM

## 2024-02-08 DIAGNOSIS — Z23 Encounter for immunization: Secondary | ICD-10-CM | POA: Insufficient documentation

## 2024-02-08 LAB — URINALYSIS, ROUTINE W REFLEX MICROSCOPIC
Bacteria, UA: NONE SEEN
Bilirubin Urine: NEGATIVE
Glucose, UA: 150 mg/dL — AB
Ketones, ur: NEGATIVE mg/dL
Leukocytes,Ua: NEGATIVE
Nitrite: NEGATIVE
Protein, ur: 100 mg/dL — AB
Specific Gravity, Urine: 1.006 (ref 1.005–1.030)
pH: 5 (ref 5.0–8.0)

## 2024-02-08 MED ORDER — OXYCODONE-ACETAMINOPHEN 5-325 MG PO TABS
1.0000 | ORAL_TABLET | Freq: Once | ORAL | Status: AC
  Administered 2024-02-08: 1 via ORAL
  Filled 2024-02-08: qty 1

## 2024-02-08 MED ORDER — LIDOCAINE HCL (PF) 1 % IJ SOLN
5.0000 mL | Freq: Once | INTRAMUSCULAR | Status: DC
  Filled 2024-02-08: qty 5

## 2024-02-08 MED ORDER — TETANUS-DIPHTH-ACELL PERTUSSIS 5-2.5-18.5 LF-MCG/0.5 IM SUSY
0.5000 mL | PREFILLED_SYRINGE | Freq: Once | INTRAMUSCULAR | Status: AC
  Administered 2024-02-08: 0.5 mL via INTRAMUSCULAR
  Filled 2024-02-08: qty 0.5

## 2024-02-08 MED ORDER — OXYCODONE HCL 5 MG PO TABS: 5.0000 mg | ORAL_TABLET | Freq: Four times a day (QID) | ORAL | 0 refills | Status: DC | PRN

## 2024-02-08 MED ORDER — ONDANSETRON HCL 4 MG PO TABS: 4.0000 mg | ORAL_TABLET | Freq: Three times a day (TID) | ORAL | 0 refills | Status: DC | PRN

## 2024-02-08 MED ORDER — ONDANSETRON 4 MG PO TBDP
4.0000 mg | ORAL_TABLET | Freq: Once | ORAL | Status: AC
  Administered 2024-02-08: 4 mg via ORAL
  Filled 2024-02-08: qty 1

## 2024-02-08 NOTE — Discharge Instructions (Addendum)
 We evaluated you for your head injury.  Your CT scans were negative for any injury.  We repaired your scalp laceration with staples.  Please have these removed in 10 to 14 days.  These can be removed by your primary doctor, urgent care, or emergency department.  You can shower as normal, but do not scrub at the staples.  If you notice any increasing pain, redness, drainage of pus, or swelling, please return for wound check.  Although your CT scans were negative, you may have a concussion given your headache.  Concussions can cause significant discomfort, nausea, and sensitivity to light.  Please take 1000 g of Tylenol  every 6 hours as needed for pain at home.  We have prescribed you a small amount of oxycodone  to take for pain unrelieved by Tylenol , this is not the best medication for headache, but since you have kidney disease, there are not many other options.  Do not drive or drink alcohol when taking oxycodone .  If you do develop headaches, please try to avoid anything that makes your headaches worse.  Sometimes, bright lights, loud noises, reading text, and other types of stimuli can cause worsening headaches.  If you notice this, try to avoid these triggers as it can cause prolonged headache.  Please follow-up with your primary care doctor regarding your headache.  If you do notice any new or worsening symptoms such as severe uncontrolled pain, uncontrolled vomiting, weakness, numbness or tingling, trouble walking, trouble speaking, slurred speech, or any other concerning symptoms, please return to the emergency department for a recheck.

## 2024-02-08 NOTE — ED Notes (Signed)
 See triage notes. Small one inch lac noted to back of head with bleeding controlled. Denies neck pain or any other pain. A/o. Denies LOC. Edp in to assess. Laceration cleaned with wound spray

## 2024-02-08 NOTE — ED Triage Notes (Signed)
 Pt fell and hit her head on the fireplace, denies LOC.  Pt believes she slipped on something that caused the fall. Lac to scalp to back of her head. Only c/o pain to head.

## 2024-02-08 NOTE — ED Provider Notes (Signed)
 [place] EMERGENCY DEPARTMENT AT [facility] Provider Note  CSN: [id number] Arrival date & time: 02/08/24 1459  Chief Complaint(s) Fall  HPI Sara [name] is a 78 y.o. female history of hypertension, CKD, presenting to the emergency department after fall.  Patient reports she slipped, fell backwards, hitting her head on the fireplace.  No loss of consciousness.  No neck pain.  No chest or back pain.  No pain in the arms or legs.  Unknown last tetanus.  Did have some bleeding.   Past Medical History Past Medical History:  Diagnosis Date   Allergic rhinitis due to pollen    Anxiety    Arthritis    "knees, back, right elbow; left shoulder; right ankle" (11/20/2014)   Breast cancer, right breast (HCC) 2016   "DCIS; zero stage" S/P mastectomy, cancer free since then   Cervical spondylosis without myelopathy    Cervicalgia    Chronic lower back pain    Constipation    takes Miralax  daily   Degeneration of cervical intervertebral disc    Depression    Diverticulosis of colon (without mention of hemorrhage)    GERD (gastroesophageal reflux disease)    takes Omeprazole daily   Graves' disease    "took a pill to correct"   H/O hiatal hernia    H/O urinary frequency    Headache(784.0)    denies migraines since the 80's but has occ and takes Butalbital prn   HTN (hypertension)    takes Metoprolol  and Lisinopril  daily   Insomnia    takes Ativan  nigtly   Joint pain    "all of them"   Joint swelling    "knees, legs, ankles sometimes" (11/20/2014)   Morbid obesity (HCC)    Numbness    LOWER LEGS   Other postablative hypothyroidism    takes SYnthroid  daily   Palpitations    Peptic ulcer, unspecified site, unspecified as acute or chronic, without mention of hemorrhage, perforation, or obstruction    Primary localized osteoarthrosis, lower leg    Pure hypercholesterolemia    takes Pravastin daily   Recurrent UTI    Renal cell carcinoma (HCC) 01/08/2016   Right renal  mass s/p partial right nephrectomy, cancer free since 2017   Shortness of breath    Sleep apnea    slight but doesn't require a CPAP (11/20/2014)   Tendonitis of knee    bilateral   Patient Active Problem List   Diagnosis Date Noted   AKI (acute kidney injury) (HCC) 12/21/2022   Hypoalbuminemia 12/21/2022   Syncope 12/20/2022   Depression 03/05/2020   Hypokalemia    Encounter for screening colonoscopy 01/25/2018   Renal cell carcinoma (HCC) 01/08/2016   Right kidney mass 12/23/2015   Liver lesion 12/23/2015   Malignant neoplasm of upper outer quadrant of female breast (HCC) 10/11/2014   S/P total knee replacement, right 01/24/14 08/08/2014   Difficulty walking 02/15/2014   Postoperative stiffness of total knee replacement (HCC) 02/15/2014   Pain in joint, lower leg 02/15/2014   Abnormality of gait 10/11/2013   S/P total knee replacement, left 08/08/14 10/09/2013   Radicular pain of right lower extremity 07/25/2013   Knee instability 07/25/2013   Right knee pain 07/25/2013   Atypical chest pain 04/25/2013   Patellar tendonitis 09/07/2012   OA (osteoarthritis) of knee 02/09/2012   GERD (gastroesophageal reflux disease) 12/01/2011   Dysphagia 12/01/2011   RUQ pain 12/01/2011   Fatty liver 12/01/2011   Hematochezia 12/01/2011   INTERMITTENT VERTIGO  09/12/2010   KNEE, ARTHRITIS, DEGEN./OSTEO 04/03/2009   CHEST PAIN, RECURRENT 03/07/2009   OTHER DYSPHAGIA 03/07/2009   PALPITATIONS 02/06/2009   LIVER FUNCTION TESTS, ABNORMAL, HX OF 01/01/2009   Peptic ulcer 12/31/2008   Backache 12/24/2008   ANSERINE BURSITIS 12/24/2008   BENIGN POSITIONAL VERTIGO 07/16/2008   H N P-LUMBAR 04/02/2008   SPINAL STENOSIS, CERVICAL 02/15/2008   SPINAL STENOSIS, LUMBAR 02/15/2008   Patellar tendinitis 10/19/2007   TEAR LATERAL MENISCUS 06/13/2007   OBESITY NOS 10/29/2006   ALLERGIC RHINITIS, SEASONAL 10/29/2006   Acquired hypothyroidism 09/07/2006   Hyperlipidemia 09/07/2006   Essential  hypertension 09/07/2006   GERD 09/07/2006   Diverticulosis of colon 09/07/2006   OVERACTIVE BLADDER 09/07/2006   FIBROCYSTIC BREAST DISEASE 09/07/2006   Osteoarthritis 09/07/2006   Home Medication(s) Prior to Admission medications   Medication Sig Start Date End Date Taking? Authorizing Provider  ondansetron  (ZOFRAN ) 4 MG tablet Take 1 tablet (4 mg total) by mouth every 8 (eight) hours as needed for nausea or vomiting. 02/08/24  Yes Mordecai Applebaum, MD  oxyCODONE  (ROXICODONE ) 5 MG immediate release tablet Take 1 tablet (5 mg total) by mouth every 6 (six) hours as needed for severe pain (pain score 7-10). 02/08/24  Yes Mordecai Applebaum, MD  acetaminophen  (TYLENOL ) 500 MG tablet Take 650 mg by mouth as needed for moderate pain or headache.    [provider]  aspirin  EC 81 MG tablet Take 81 mg by mouth daily. Swallow whole.    [provider]  atorvastatin  (LIPITOR) 20 MG tablet Take 1 tablet (20 mg total) by mouth daily. 03/07/20 09/27/23  Barbee Lew, MD  dapagliflozin propanediol (FARXIGA) 5 MG TABS tablet Take 5 mg by mouth every morning.    [provider]  gabapentin  (NEURONTIN ) 600 MG tablet Take 1 tablet (600 mg total) by mouth 2 (two) times daily. 10/25/23   Darrin Emerald, MD  levothyroxine  (SYNTHROID ) 150 MCG tablet Take 1 tablet (150 mcg total) by mouth daily. 12/23/22   Krishnan, Gokul, MD  Magnesium  400 MG CAPS Take 400 mg by mouth daily. Patient not taking: Reported on 11/15/2023    [provider]  metoprolol  succinate (TOPROL  XL) 25 MG 24 hr tablet Take 1 1/2 tablet in the morning (37.5 mg) and take 1/2 (12.5mg ) tablet at night. 11/15/23   Millicent Ally, MD  Multiple Vitamins-Minerals (CENTRUM SILVER PO) Take 1 tablet by mouth daily.    [provider]  omeprazole (PRILOSEC) 20 MG capsule Take 20 mg by mouth daily. May take a second 20 mg dose as needed for heartburn    [provider]  oxybutynin  (DITROPAN ) 5 MG  tablet Take 5 mg by mouth 2 (two) times daily.  07/31/15   [provider]  potassium chloride  (KLOR-CON ) 20 MEQ packet Take 20 mEq by mouth 2 (two) times daily.    [provider]  torsemide  (DEMADEX ) 20 MG tablet Take 3 tablets (60 mg total) by mouth daily. 02/04/24   Millicent Ally, MD  Vitamin D , Ergocalciferol , (DRISDOL ) 1.25 MG (50000 UNIT) CAPS capsule TAKE 1 CAPSULE BY MOUTH WEEKLY 04/05/23   Sheril Dines M, PA-C  Ranitidine HCl (ZANTAC 75 PO) Take by mouth.    12/01/11  [provider]  Past Surgical History Past Surgical History:  Procedure Laterality Date   ABDOMINAL HYSTERECTOMY     ANTERIOR LAT LUMBAR FUSION  05/13/2012   Procedure: ANTERIOR LATERAL LUMBAR FUSION 2 LEVELS;  Surgeon: Augustine Blocker, MD;  Location: [facility];  Service: Neurosurgery;  Laterality: Left;  Left Lumbar Three-four,Lumbar four-five Extreme Lumbar Interbody Fusion with Percutaneous Pedicle Screws   APPENDECTOMY     BREAST BIOPSY Left ~ 2014   BREAST BIOPSY Right 2015   BREAST LUMPECTOMY Left 1988   BREAST RECONSTRUCTION WITH PLACEMENT OF TISSUE EXPANDER AND FLEX HD (ACELLULAR HYDRATED DERMIS) Right 11/20/2014   BREAST RECONSTRUCTION WITH PLACEMENT OF TISSUE EXPANDER AND FLEX HD (ACELLULAR HYDRATED DERMIS) Right 11/20/2014   Procedure: RIGHT BREAST RECONSTRUCTION WITH PLACEMENT OF TISSUE EXPANDER AND ACELLULAR DERMA MATRIX (ACELLULAR DERMA MATRIX);  Surgeon: Elidia Grout, MD;  Location: [facility];  Service: Plastics;  Laterality: Right;   CATARACT EXTRACTION W/ INTRAOCULAR LENS  IMPLANT, BILATERAL Bilateral    COLONOSCOPY  Sept 2008   RMR: normal rectum, left-sided diverticula, repeat in 2018   COLONOSCOPY WITH PROPOFOL  N/A 03/21/2018   Dr. Riley Cheadle: diverticulosis, internal grade I hemorrhoids. no future colonoscopies unless develops new symptoms    DIAGNOSTIC LAPAROSCOPY     DILATION AND CURETTAGE OF UTERUS     ESOPHAGOGASTRODUODENOSCOPY     ESOPHAGOGASTRODUODENOSCOPY  2013   Dr. Riley Cheadle: Cervical esophageal web and Schatzki's ring s/p dilation, small hiatal hernia, antral and duodenal erosions likely NSAID effect, chronic duodenitis on path    ESOPHAGOGASTRODUODENOSCOPY (EGD) WITH PROPOFOL  N/A 03/21/2018   Dr. Riley Cheadle: mild Schatzki ring s/p dilation   FOREIGN BODY REMOVAL Right 10/06/2013   Procedure: REMOVAL FOREIGN BODY EXTREMITY;  Surgeon: Darrin Emerald, MD;  Location: [facility];  Service: Orthopedics;  Laterality: Right;   INJECTION KNEE Left 10/06/2013   Procedure: KNEE INJECTION;  Surgeon: Darrin Emerald, MD;  Location: [facility];  Service: Orthopedics;  Laterality: Left;   JOINT REPLACEMENT Bilateral    knees   KNEE ARTHROSCOPY Right    KNEE ARTHROSCOPY WITH LATERAL MENISECTOMY Right 10/06/2013   Procedure: KNEE ARTHROSCOPY WITH LATERAL AND MEDIAL MENISECTOMY;  Surgeon: Darrin Emerald, MD;  Location: [facility];  Service: Orthopedics;  Laterality: Right;  END @ 1234   lumpectomy on pelvis     "mass of nerves"   MALONEY DILATION N/A 03/21/2018   Procedure: MALONEY DILATION;  Surgeon: Suzette Espy, MD;  Location: [facility];  Service: Endoscopy;  Laterality: N/A;   MASTECTOMY COMPLETE / SIMPLE W/ SENTINEL NODE BIOPSY Right 11/20/2014   axillary   REMOVAL OF TISSUE EXPANDER AND PLACEMENT OF IMPLANT Right 12/13/2014   Procedure: REMOVAL OF RIGHT BREAST TISSUE EXPANDER;  Surgeon: Elidia Grout, MD;  Location: [facility] OR;  Service: Plastics;  Laterality: Right;   ROBOTIC ASSITED PARTIAL NEPHRECTOMY Right 01/08/2016   Procedure: XI ROBOTIC ASSITED PARTIAL NEPHRECTOMY;  Surgeon: Osborn Blaze, MD;  Location: [facility];  Service: Urology;  Laterality: Right;   SIMPLE MASTECTOMY WITH AXILLARY SENTINEL NODE BIOPSY Right 11/20/2014   Procedure: SIMPLE MASTECTOMY WITH AXILLARY SENTINEL NODE BIOPSY;  Surgeon: Sim Dryer, MD;  Location: [facility] OR;   Service: General;  Laterality: Right;   TISSUE EXPANDER REMOVAL Right 12/13/2014   dr Hobart Lulas   TONSILLECTOMY     TOTAL KNEE ARTHROPLASTY Right 01/24/2014   Procedure: TOTAL KNEE ARTHROPLASTY;  Surgeon: Darrin Emerald, MD;  Location: [facility];  Service: Orthopedics;  Laterality: Right;   TOTAL KNEE ARTHROPLASTY Left 08/08/2014   Procedure:  TOTAL KNEE ARTHROPLASTY;  Surgeon: Darrin Emerald, MD;  Location: [facility];  Service: Orthopedics;  Laterality: Left;   TUBAL LIGATION     UPPER GASTROINTESTINAL ENDOSCOPY     Family History Family History  Problem Relation Age of Onset   Stroke Sister    Heart attack Sister        In her 52s   Osteoarthritis Sister    Heart attack Sister        In her 84s   Osteoarthritis Brother    Obesity Brother    Heart attack Sister        In her 88s   Heart attack Father        He died at age 40   Lung cancer Maternal Grandmother        non-smoker   Colon cancer Neg Hx        Mother died at 82 from blood clot, MI    Social History Social History   Tobacco Use   Smoking status: Former    Current packs/day: 0.00    Average packs/day: 1 pack/day for 27.0 years (27.0 ttl pk-yrs)    Types: Cigarettes    Start date: 12/27/1958    Quit date: 12/26/1985    Years since quitting: 38.1   Smokeless tobacco: Never   Tobacco comments:    Quit in 1987  Vaping Use   Vaping status: Never Used  Substance Use Topics   Alcohol use: Yes    Alcohol/week: 0.0 standard drinks of alcohol    Comment: occasional wine   Drug use: No   Allergies Propoxyphene hcl, Pollen extract, Camphor, Dust mite extract, Latex, Molds & smuts, and Tomato  Review of Systems Review of Systems  All other systems reviewed and are negative.   Physical Exam Vital Signs  I have reviewed the triage vital signs BP (!) 104/56 (BP Location: Left Arm)   Pulse (!) 54   Temp (!) 97.5 F (36.4 C) (Oral)   Resp 16   Ht 5\' 6"  (1.676 m)   Wt 93 kg   SpO2 98%   BMI 33.09 kg/m   Physical Exam Vitals and nursing note reviewed.  Constitutional:      General: She is not in acute distress.    Appearance: She is well-developed.  HENT:     Head: Normocephalic.     Comments: Approximately 1 cm posterior scalp laceration    Mouth/Throat:     Mouth: Mucous membranes are moist.  Eyes:     Pupils: Pupils are equal, round, and reactive to light.  Cardiovascular:     Rate and Rhythm: Normal rate and regular rhythm.     Heart sounds: No murmur heard. Pulmonary:     Effort: Pulmonary effort is normal. No respiratory distress.     Breath sounds: Normal breath sounds.  Abdominal:     General: Abdomen is flat.     Palpations: Abdomen is soft.     Tenderness: There is no abdominal tenderness.  Musculoskeletal:        General: No tenderness.     Right lower leg: No edema.     Left lower leg: No edema.     Comments: No midline C, T, L-spine tenderness.  No chest wall tenderness or crepitus.  Full painless range of motion at the bilateral upper extremities including the shoulders, elbows, wrists, hand and fingers, and in the bilateral lower extremities including the hips, knees, ankle, toes.  No focal bony tenderness, injury  or deformity.  Skin:    General: Skin is warm and dry.  Neurological:     General: No focal deficit present.     Mental Status: She is alert. Mental status is at baseline.  Psychiatric:        Mood and Affect: Mood normal.        Behavior: Behavior normal.     ED Results and Treatments Labs (all labs ordered are listed, but only abnormal results are displayed) Labs Reviewed  URINALYSIS, ROUTINE W REFLEX MICROSCOPIC - Abnormal; Notable for the following components:      Result Value   Color, Urine STRAW (*)    Glucose, UA 150 (*)    Hgb urine dipstick SMALL (*)    Protein, ur 100 (*)    All other components within normal limits                                                                                                                           Radiology CT Head Wo Contrast Result Date: 02/08/2024 CLINICAL DATA:  Marvell Slider and hit head on fireplace laceration to posterior scalp EXAM: CT HEAD WITHOUT CONTRAST CT CERVICAL SPINE WITHOUT CONTRAST TECHNIQUE: Multidetector CT imaging of the head and cervical spine was performed following the standard protocol without intravenous contrast. Multiplanar CT image reconstructions of the cervical spine were also generated. RADIATION DOSE REDUCTION: This exam was performed according to the departmental dose-optimization program which includes automated exposure control, adjustment of the mA and/or kV according to patient size and/or use of iterative reconstruction technique. COMPARISON:  CT brain 10/30/2020, 11/10/2023 FINDINGS: CT HEAD FINDINGS Brain: No acute territorial infarction, hemorrhage or intracranial mass. The ventricles are nonenlarged. Vascular: No hyperdense vessel or unexpected calcification. Skull: Normal. Negative for fracture or focal lesion. Sinuses/Orbits: No acute finding. Other: Small left posterior scalp hematoma CT CERVICAL SPINE FINDINGS Alignment: Mild reversal of cervical lordosis. Trace anterolisthesis C7 on T1. Facet alignment is within normal limits Skull base and vertebrae: No acute fracture. No primary bone lesion or focal pathologic process. Incomplete fusion posterior arch of C1. Soft tissues and spinal canal: No prevertebral fluid or swelling. No visible canal hematoma. Disc levels: Multilevel degenerative changes. Severe diffuse disc space narrowing and degenerative osteophyte C3 through C7. Hypertrophic facet degenerative changes at multiple levels with foraminal narrowing. Upper chest: Negative. Other: None IMPRESSION: 1. Negative non contrasted CT appearance of the brain. Small left posterior scalp hematoma. 2. Reversal of cervical lordosis with trace anterolisthesis C7 on T1, likely degenerative. No acute osseous abnormality. Advanced multilevel degenerative changes  Electronically Signed   By: Esmeralda Hedge M.D.   On: 02/08/2024 18:51   CT Cervical Spine Wo Contrast Result Date: 02/08/2024 CLINICAL DATA:  Marvell Slider and hit head on fireplace laceration to posterior scalp EXAM: CT HEAD WITHOUT CONTRAST CT CERVICAL SPINE WITHOUT CONTRAST TECHNIQUE: Multidetector CT imaging of the head and cervical spine was performed following the standard protocol without intravenous contrast. Multiplanar CT image reconstructions  of the cervical spine were also generated. RADIATION DOSE REDUCTION: This exam was performed according to the departmental dose-optimization program which includes automated exposure control, adjustment of the mA and/or kV according to patient size and/or use of iterative reconstruction technique. COMPARISON:  CT brain 10/30/2020, 11/10/2023 FINDINGS: CT HEAD FINDINGS Brain: No acute territorial infarction, hemorrhage or intracranial mass. The ventricles are nonenlarged. Vascular: No hyperdense vessel or unexpected calcification. Skull: Normal. Negative for fracture or focal lesion. Sinuses/Orbits: No acute finding. Other: Small left posterior scalp hematoma CT CERVICAL SPINE FINDINGS Alignment: Mild reversal of cervical lordosis. Trace anterolisthesis C7 on T1. Facet alignment is within normal limits Skull base and vertebrae: No acute fracture. No primary bone lesion or focal pathologic process. Incomplete fusion posterior arch of C1. Soft tissues and spinal canal: No prevertebral fluid or swelling. No visible canal hematoma. Disc levels: Multilevel degenerative changes. Severe diffuse disc space narrowing and degenerative osteophyte C3 through C7. Hypertrophic facet degenerative changes at multiple levels with foraminal narrowing. Upper chest: Negative. Other: None IMPRESSION: 1. Negative non contrasted CT appearance of the brain. Small left posterior scalp hematoma. 2. Reversal of cervical lordosis with trace anterolisthesis C7 on T1, likely degenerative. No acute  osseous abnormality. Advanced multilevel degenerative changes Electronically Signed   By: Esmeralda Hedge M.D.   On: 02/08/2024 18:51    Pertinent labs & imaging results that were available during my care of the patient were reviewed by me and considered in my medical decision making (see MDM for details).  Medications Ordered in ED Medications  lidocaine  (PF) (XYLOCAINE ) 1 % injection 5 mL (has no administration in time range)  oxyCODONE -acetaminophen  (PERCOCET/ROXICET) 5-325 MG per tablet 1 tablet (has no administration in time range)  ondansetron  (ZOFRAN -ODT) disintegrating tablet 4 mg (has no administration in time range)  oxyCODONE -acetaminophen  (PERCOCET/ROXICET) 5-325 MG per tablet 1 tablet (1 tablet Oral Given 02/08/24 1812)  Tdap (BOOSTRIX) injection 0.5 mL (0.5 mLs Intramuscular Given 02/08/24 1812)                                                                                                                                     Procedures Procedures  (including critical care time)  Medical Decision Making / ED Course   MDM:  78 year old presenting to the emergency department with fall.  Patient reports mechanical fall, slipped on something and fell backwards.  No loss of consciousness.  CT scans of the head and neck were obtained with no evidence of fracture.  Physical examination without signs of other injury.  Wound was irrigated thoroughly and repaired with staples.  Patient did endorse headache, reports sensitivity to light, suspect early concussion.  Patient reports she is not able to take NSAIDs due to her CKD.  Discussed Tylenol , avoiding triggers, follow-up with PMD for this as well as her staple removal.  Given limited options for pain control will give small amount of oxycodone , reviewed PDMP, discussed that  this is not optimal medication should try Tylenol  first, as well as avoiding alcohol or driving while taking this medication.  Will discharge patient to home. All  questions answered. Patient comfortable with plan of discharge. Return precautions discussed with patient and specified on the after visit summary.       Additional history obtained: -Additional history obtained from spouse -External records from outside source obtained and reviewed including: Chart review including previous notes, labs, imaging, consultation notes including prior notes    Lab Tests: -I ordered, reviewed, and interpreted labs.   The pertinent results include:   Labs Reviewed  URINALYSIS, ROUTINE W REFLEX MICROSCOPIC - Abnormal; Notable for the following components:      Result Value   Color, Urine STRAW (*)    Glucose, UA 150 (*)    Hgb urine dipstick SMALL (*)    Protein, ur 100 (*)    All other components within normal limits    Notable for proteinuria, urinalysis sent by nursing   Imaging Studies ordered: I ordered imaging studies including CT head and neck On my interpretation imaging demonstrates no acute process I independently visualized and interpreted imaging. I agree with the radiologist interpretation   Medicines ordered and prescription drug management: Meds ordered this encounter  Medications   oxyCODONE -acetaminophen  (PERCOCET/ROXICET) 5-325 MG per tablet 1 tablet    Refill:  0   Tdap (BOOSTRIX) injection 0.5 mL   lidocaine  (PF) (XYLOCAINE ) 1 % injection 5 mL   oxyCODONE  (ROXICODONE ) 5 MG immediate release tablet    Sig: Take 1 tablet (5 mg total) by mouth every 6 (six) hours as needed for severe pain (pain score 7-10).    Dispense:  10 tablet    Refill:  0   ondansetron  (ZOFRAN ) 4 MG tablet    Sig: Take 1 tablet (4 mg total) by mouth every 8 (eight) hours as needed for nausea or vomiting.    Dispense:  12 tablet    Refill:  0   oxyCODONE -acetaminophen  (PERCOCET/ROXICET) 5-325 MG per tablet 1 tablet    Refill:  0   ondansetron  (ZOFRAN -ODT) disintegrating tablet 4 mg    -I have reviewed the patients home medicines and have made  adjustments as needed   Social Determinants of Health:  Diagnosis or treatment significantly limited by social determinants of health: obesity   Reevaluation: After the interventions noted above, I reevaluated the patient and found that their symptoms have improved  Co morbidities that complicate the patient evaluation  Past Medical History:  Diagnosis Date   Allergic rhinitis due to pollen    Anxiety    Arthritis    "knees, back, right elbow; left shoulder; right ankle" (11/20/2014)   Breast cancer, right breast (HCC) 2016   "DCIS; zero stage" S/P mastectomy, cancer free since then   Cervical spondylosis without myelopathy    Cervicalgia    Chronic lower back pain    Constipation    takes Miralax  daily   Degeneration of cervical intervertebral disc    Depression    Diverticulosis of colon (without mention of hemorrhage)    GERD (gastroesophageal reflux disease)    takes Omeprazole daily   Graves' disease    "took a pill to correct"   H/O hiatal hernia    H/O urinary frequency    Headache(784.0)    denies migraines since the 80's but has occ and takes Butalbital prn   HTN (hypertension)    takes Metoprolol  and Lisinopril  daily   Insomnia  takes Ativan  nigtly   Joint pain    "all of them"   Joint swelling    "knees, legs, ankles sometimes" (11/20/2014)   Morbid obesity (HCC)    Numbness    LOWER LEGS   Other postablative hypothyroidism    takes SYnthroid  daily   Palpitations    Peptic ulcer, unspecified site, unspecified as acute or chronic, without mention of hemorrhage, perforation, or obstruction    Primary localized osteoarthrosis, lower leg    Pure hypercholesterolemia    takes Pravastin daily   Recurrent UTI    Renal cell carcinoma (HCC) 01/08/2016   Right renal mass s/p partial right nephrectomy, cancer free since 2017   Shortness of breath    Sleep apnea    slight but doesn't require a CPAP (11/20/2014)   Tendonitis of knee    bilateral       Dispostion: Disposition decision including need for hospitalization was considered, and patient discharged from emergency department.    Final Clinical Impression(s) / ED Diagnoses Final diagnoses:  Injury of head, initial encounter  Laceration of scalp, initial encounter     This chart was dictated using voice recognition software.  Despite best efforts to proofread,  errors can occur which can change the documentation meaning.    Mordecai Applebaum, MD 02/08/24 [id number]

## 2024-02-22 DIAGNOSIS — Z4802 Encounter for removal of sutures: Secondary | ICD-10-CM | POA: Diagnosis not present

## 2024-02-22 DIAGNOSIS — S0101XD Laceration without foreign body of scalp, subsequent encounter: Secondary | ICD-10-CM | POA: Diagnosis not present

## 2024-02-22 DIAGNOSIS — N3281 Overactive bladder: Secondary | ICD-10-CM | POA: Diagnosis not present

## 2024-02-22 DIAGNOSIS — N1831 Chronic kidney disease, stage 3a: Secondary | ICD-10-CM | POA: Diagnosis not present

## 2024-02-22 DIAGNOSIS — E039 Hypothyroidism, unspecified: Secondary | ICD-10-CM | POA: Diagnosis not present

## 2024-02-24 ENCOUNTER — Emergency Department (HOSPITAL_COMMUNITY)

## 2024-02-24 ENCOUNTER — Emergency Department (HOSPITAL_COMMUNITY)
Admission: EM | Admit: 2024-02-24 | Discharge: 2024-02-24 | Disposition: A | Discharge status: Home / Self Care | Attending: Emergency Medicine | Admitting: Emergency Medicine

## 2024-02-24 ENCOUNTER — Encounter (HOSPITAL_COMMUNITY): Payer: Self-pay | Admitting: Emergency Medicine

## 2024-02-24 ENCOUNTER — Other Ambulatory Visit: Payer: Self-pay

## 2024-02-24 DIAGNOSIS — R112 Nausea with vomiting, unspecified: Secondary | ICD-10-CM | POA: Diagnosis not present

## 2024-02-24 DIAGNOSIS — Z79899 Other long term (current) drug therapy: Secondary | ICD-10-CM | POA: Diagnosis not present

## 2024-02-24 DIAGNOSIS — R0602 Shortness of breath: Secondary | ICD-10-CM | POA: Insufficient documentation

## 2024-02-24 DIAGNOSIS — Z91040 Latex allergy status: Secondary | ICD-10-CM | POA: Diagnosis not present

## 2024-02-24 DIAGNOSIS — Z853 Personal history of malignant neoplasm of breast: Secondary | ICD-10-CM | POA: Insufficient documentation

## 2024-02-24 DIAGNOSIS — Z87891 Personal history of nicotine dependence: Secondary | ICD-10-CM | POA: Diagnosis not present

## 2024-02-24 DIAGNOSIS — R079 Chest pain, unspecified: Secondary | ICD-10-CM | POA: Diagnosis not present

## 2024-02-24 DIAGNOSIS — Z85528 Personal history of other malignant neoplasm of kidney: Secondary | ICD-10-CM | POA: Diagnosis not present

## 2024-02-24 DIAGNOSIS — I517 Cardiomegaly: Secondary | ICD-10-CM | POA: Diagnosis not present

## 2024-02-24 DIAGNOSIS — Z96653 Presence of artificial knee joint, bilateral: Secondary | ICD-10-CM | POA: Diagnosis not present

## 2024-02-24 DIAGNOSIS — R42 Dizziness and giddiness: Secondary | ICD-10-CM | POA: Diagnosis not present

## 2024-02-24 DIAGNOSIS — I10 Essential (primary) hypertension: Secondary | ICD-10-CM | POA: Insufficient documentation

## 2024-02-24 DIAGNOSIS — Z7982 Long term (current) use of aspirin: Secondary | ICD-10-CM | POA: Insufficient documentation

## 2024-02-24 DIAGNOSIS — R1110 Vomiting, unspecified: Secondary | ICD-10-CM | POA: Diagnosis not present

## 2024-02-24 DIAGNOSIS — R0789 Other chest pain: Secondary | ICD-10-CM | POA: Insufficient documentation

## 2024-02-24 LAB — CBC WITH DIFFERENTIAL/PLATELET
Abs Immature Granulocytes: 0.03 10*3/uL (ref 0.00–0.07)
Basophils Absolute: 0 10*3/uL (ref 0.0–0.1)
Basophils Relative: 0 %
Eosinophils Absolute: 0.2 10*3/uL (ref 0.0–0.5)
Eosinophils Relative: 2 %
HCT: 40.7 % (ref 36.0–46.0)
Hemoglobin: 13.6 g/dL (ref 12.0–15.0)
Immature Granulocytes: 0 %
Lymphocytes Relative: 27 %
Lymphs Abs: 2.6 10*3/uL (ref 0.7–4.0)
MCH: 32.2 pg (ref 26.0–34.0)
MCHC: 33.4 g/dL (ref 30.0–36.0)
MCV: 96.2 fL (ref 80.0–100.0)
Monocytes Absolute: 0.8 10*3/uL (ref 0.1–1.0)
Monocytes Relative: 8 %
Neutro Abs: 6.1 10*3/uL (ref 1.7–7.7)
Neutrophils Relative %: 63 %
Platelets: 236 10*3/uL (ref 150–400)
RBC: 4.23 MIL/uL (ref 3.87–5.11)
RDW: 13.8 % (ref 11.5–15.5)
WBC: 9.6 10*3/uL (ref 4.0–10.5)
nRBC: 0 % (ref 0.0–0.2)

## 2024-02-24 LAB — COMPREHENSIVE METABOLIC PANEL WITH GFR
ALT: 25 U/L (ref 0–44)
AST: 38 U/L (ref 15–41)
Albumin: 2.1 g/dL — ABNORMAL LOW (ref 3.5–5.0)
Alkaline Phosphatase: 71 U/L (ref 38–126)
Anion gap: 10 (ref 5–15)
BUN: 19 mg/dL (ref 8–23)
CO2: 29 mmol/L (ref 22–32)
Calcium: 7.7 mg/dL — ABNORMAL LOW (ref 8.9–10.3)
Chloride: 102 mmol/L (ref 98–111)
Creatinine, Ser: 1.23 mg/dL — ABNORMAL HIGH (ref 0.44–1.00)
GFR, Estimated: 46 mL/min — ABNORMAL LOW (ref >60)
Glucose, Bld: 120 mg/dL — ABNORMAL HIGH (ref 70–99)
Potassium: 2.9 mmol/L — ABNORMAL LOW (ref 3.5–5.1)
Sodium: 141 mmol/L (ref 135–145)
Total Bilirubin: 0.4 mg/dL (ref 0.0–1.2)
Total Protein: 5.4 g/dL — ABNORMAL LOW (ref 6.5–8.1)

## 2024-02-24 LAB — TROPONIN I (HIGH SENSITIVITY)
Troponin I (High Sensitivity): 8 ng/L (ref <18)
Troponin I (High Sensitivity): 9 ng/L (ref <18)

## 2024-02-24 MED ORDER — ACETAMINOPHEN 500 MG PO TABS
1000.0000 mg | ORAL_TABLET | Freq: Once | ORAL | Status: AC
  Administered 2024-02-24: 1000 mg via ORAL
  Filled 2024-02-24: qty 2

## 2024-02-24 MED ORDER — ONDANSETRON HCL 4 MG/2ML IJ SOLN
4.0000 mg | Freq: Once | INTRAMUSCULAR | Status: AC
  Administered 2024-02-24: 4 mg via INTRAVENOUS
  Filled 2024-02-24: qty 2

## 2024-02-24 MED ORDER — SODIUM CHLORIDE 0.9 % IV BOLUS
1000.0000 mL | Freq: Once | INTRAVENOUS | Status: AC
  Administered 2024-02-24: 1000 mL via INTRAVENOUS

## 2024-02-24 MED ORDER — POTASSIUM CHLORIDE CRYS ER 20 MEQ PO TBCR
40.0000 meq | EXTENDED_RELEASE_TABLET | Freq: Once | ORAL | Status: AC
  Administered 2024-02-24: 40 meq via ORAL
  Filled 2024-02-24: qty 4

## 2024-02-24 NOTE — Discharge Instructions (Signed)
 We evaluated you for your chest pain.  Your testing in the emergency department is reassuring.  Your cardiac enzyme testing was normal.  Since your pain is resolved, we feel that it is safe to go home, but we would like you to follow-up very closely with a cardiologist.  We have placed a referral to cardiology.  They will call you to help schedule an appointment.  If your pain recurs, or you develop any new symptoms such as lightheadedness or dizziness, difficulty breathing, fevers or chills, leg swelling, or any other concerning symptoms, please return to the emergency department for reassessment.

## 2024-02-24 NOTE — ED Notes (Signed)
 ED Provider at bedside.

## 2024-02-24 NOTE — ED Triage Notes (Signed)
 Sara arrives via [facility] from home. 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 She describes the pain as squeezing in the center of her chest. EMS administered 324 mg ASA.  BP- 99/68  Hr-60s NS  Sara [name].

## 2024-02-24 NOTE — ED Provider Notes (Signed)
 [place] EMERGENCY DEPARTMENT AT [facility] Provider Note  CSN: [id number] Arrival date & time: 02/24/24 1952  Chief Complaint(s) Chest Pain  HPI Sara [name] is a 78 y.o. female history of hypertension, hyperlipidemia, presenting to the emergency department with chest pain.  Patient reports episode of chest pain while at church, reports that she felt chest pain, nausea, vomiting x 1, dizziness, shortness of breath.  Reports it was a squeezing pain.  Pain is resolved.  Pain did not radiate.  Was given aspirin  by EMS.  No fevers or chills.  No cough.  Was sitting down when pain began.   Past Medical History Past Medical History:  Diagnosis Date   Allergic rhinitis due to pollen    Anxiety    Arthritis    "knees, back, right elbow; left shoulder; right ankle" (11/20/2014)   Breast cancer, right breast (HCC) 2016   "DCIS; zero stage" S/P mastectomy, cancer free since then   Cervical spondylosis without myelopathy    Cervicalgia    Chronic lower back pain    Constipation    takes Miralax  daily   Degeneration of cervical intervertebral disc    Depression    Diverticulosis of colon (without mention of hemorrhage)    GERD (gastroesophageal reflux disease)    takes Omeprazole daily   Graves' disease    "took a pill to correct"   H/O hiatal hernia    H/O urinary frequency    Headache(784.0)    denies migraines since the 80's but has occ and takes Butalbital prn   HTN (hypertension)    takes Metoprolol  and Lisinopril  daily   Insomnia    takes Ativan  nigtly   Joint pain    "all of them"   Joint swelling    "knees, legs, ankles sometimes" (11/20/2014)   Morbid obesity (HCC)    Numbness    LOWER LEGS   Other postablative hypothyroidism    takes SYnthroid  daily   Palpitations    Peptic ulcer, unspecified site, unspecified as acute or chronic, without mention of hemorrhage, perforation, or obstruction    Primary localized osteoarthrosis, lower leg    Pure  hypercholesterolemia    takes Pravastin daily   Recurrent UTI    Renal cell carcinoma (HCC) 01/08/2016   Right renal mass s/p partial right nephrectomy, cancer free since 2017   Shortness of breath    Sleep apnea    slight but doesn't require a CPAP (11/20/2014)   Tendonitis of knee    bilateral   Patient Active Problem List   Diagnosis Date Noted   AKI (acute kidney injury) (HCC) 12/21/2022   Hypoalbuminemia 12/21/2022   Syncope 12/20/2022   Depression 03/05/2020   Hypokalemia    Encounter for screening colonoscopy 01/25/2018   Renal cell carcinoma (HCC) 01/08/2016   Right kidney mass 12/23/2015   Liver lesion 12/23/2015   Malignant neoplasm of upper outer quadrant of female breast (HCC) 10/11/2014   S/P total knee replacement, right 01/24/14 08/08/2014   Difficulty walking 02/15/2014   Postoperative stiffness of total knee replacement (HCC) 02/15/2014   Pain in joint, lower leg 02/15/2014   Abnormality of gait 10/11/2013   S/P total knee replacement, left 08/08/14 10/09/2013   Radicular pain of right lower extremity 07/25/2013   Knee instability 07/25/2013   Right knee pain 07/25/2013   Atypical chest pain 04/25/2013   Patellar tendonitis 09/07/2012   OA (osteoarthritis) of knee 02/09/2012   GERD (gastroesophageal reflux disease) 12/01/2011   Dysphagia 12/01/2011  RUQ pain 12/01/2011   Fatty liver 12/01/2011   Hematochezia 12/01/2011   INTERMITTENT VERTIGO 09/12/2010   KNEE, ARTHRITIS, DEGEN./OSTEO 04/03/2009   CHEST PAIN, RECURRENT 03/07/2009   OTHER DYSPHAGIA 03/07/2009   PALPITATIONS 02/06/2009   LIVER FUNCTION TESTS, ABNORMAL, HX OF 01/01/2009   Peptic ulcer 12/31/2008   Backache 12/24/2008   ANSERINE BURSITIS 12/24/2008   BENIGN POSITIONAL VERTIGO 07/16/2008   H N P-LUMBAR 04/02/2008   SPINAL STENOSIS, CERVICAL 02/15/2008   SPINAL STENOSIS, LUMBAR 02/15/2008   Patellar tendinitis 10/19/2007   TEAR LATERAL MENISCUS 06/13/2007   OBESITY NOS 10/29/2006    ALLERGIC RHINITIS, SEASONAL 10/29/2006   Acquired hypothyroidism 09/07/2006   Hyperlipidemia 09/07/2006   Essential hypertension 09/07/2006   GERD 09/07/2006   Diverticulosis of colon 09/07/2006   OVERACTIVE BLADDER 09/07/2006   FIBROCYSTIC BREAST DISEASE 09/07/2006   Osteoarthritis 09/07/2006   Home Medication(s) Prior to Admission medications   Medication Sig Start Date End Date Taking? Authorizing Provider  acetaminophen  (TYLENOL ) 500 MG tablet Take 650 mg by mouth as needed for moderate pain or headache.    [provider]  aspirin  EC 81 MG tablet Take 81 mg by mouth daily. Swallow whole.    [provider]  atorvastatin  (LIPITOR) 20 MG tablet Take 1 tablet (20 mg total) by mouth daily. 03/07/20 09/27/23  Barbee Lew, MD  dapagliflozin propanediol (FARXIGA) 5 MG TABS tablet Take 5 mg by mouth every morning.    [provider]  gabapentin  (NEURONTIN ) 600 MG tablet Take 1 tablet (600 mg total) by mouth 2 (two) times daily. 10/25/23   Darrin Emerald, MD  levothyroxine  (SYNTHROID ) 150 MCG tablet Take 1 tablet (150 mcg total) by mouth daily. 12/23/22   Krishnan, Gokul, MD  Magnesium  400 MG CAPS Take 400 mg by mouth daily. Patient not taking: Reported on 11/15/2023    [provider]  metoprolol  succinate (TOPROL  XL) 25 MG 24 hr tablet Take 1 1/2 tablet in the morning (37.5 mg) and take 1/2 (12.5mg ) tablet at night. 11/15/23   Millicent Ally, MD  Multiple Vitamins-Minerals (CENTRUM SILVER PO) Take 1 tablet by mouth daily.    [provider]  omeprazole (PRILOSEC) 20 MG capsule Take 20 mg by mouth daily. May take a second 20 mg dose as needed for heartburn    [provider]  ondansetron  (ZOFRAN ) 4 MG tablet Take 1 tablet (4 mg total) by mouth every 8 (eight) hours as needed for nausea or vomiting. 02/08/24   Mordecai Applebaum, MD  oxybutynin  (DITROPAN ) 5 MG tablet Take 5 mg by mouth 2 (two) times daily.  07/31/15   [provider]  oxyCODONE  (ROXICODONE ) 5 MG immediate release tablet Take 1 tablet (5 mg total) by mouth every 6 (six) hours as needed for severe pain (pain score 7-10). 02/08/24   Mordecai Applebaum, MD  potassium chloride  (KLOR-CON ) 20 MEQ packet Take 20 mEq by mouth 2 (two) times daily.    [provider]  torsemide  (DEMADEX ) 20 MG tablet Take 3 tablets (60 mg total) by mouth daily. 02/04/24   Millicent Ally, MD  Vitamin D , Ergocalciferol , (DRISDOL ) 1.25 MG (50000 UNIT) CAPS capsule TAKE 1 CAPSULE BY MOUTH WEEKLY 04/05/23   Sheril Dines M, PA-C  Ranitidine HCl (ZANTAC 75 PO) Take by mouth.    12/01/11  [provider]  Past Surgical History Past Surgical History:  Procedure Laterality Date   ABDOMINAL HYSTERECTOMY     ANTERIOR LAT LUMBAR FUSION  05/13/2012   Procedure: ANTERIOR LATERAL LUMBAR FUSION 2 LEVELS;  Surgeon: Augustine Blocker, MD;  Location: [facility];  Service: Neurosurgery;  Laterality: Left;  Left Lumbar Three-four,Lumbar four-five Extreme Lumbar Interbody Fusion with Percutaneous Pedicle Screws   APPENDECTOMY     BREAST BIOPSY Left ~ 2014   BREAST BIOPSY Right 2015   BREAST LUMPECTOMY Left 1988   BREAST RECONSTRUCTION WITH PLACEMENT OF TISSUE EXPANDER AND FLEX HD (ACELLULAR HYDRATED DERMIS) Right 11/20/2014   BREAST RECONSTRUCTION WITH PLACEMENT OF TISSUE EXPANDER AND FLEX HD (ACELLULAR HYDRATED DERMIS) Right 11/20/2014   Procedure: RIGHT BREAST RECONSTRUCTION WITH PLACEMENT OF TISSUE EXPANDER AND ACELLULAR DERMA MATRIX (ACELLULAR DERMA MATRIX);  Surgeon: Elidia Grout, MD;  Location: [facility] OR;  Service: Plastics;  Laterality: Right;   CATARACT EXTRACTION W/ INTRAOCULAR LENS  IMPLANT, BILATERAL Bilateral    COLONOSCOPY  Sept 2008   RMR: normal rectum, left-sided diverticula, repeat in 2018   COLONOSCOPY WITH PROPOFOL  N/A 03/21/2018    Dr. Riley Cheadle: diverticulosis, internal grade I hemorrhoids. no future colonoscopies unless develops new symptoms   DIAGNOSTIC LAPAROSCOPY     DILATION AND CURETTAGE OF UTERUS     ESOPHAGOGASTRODUODENOSCOPY     ESOPHAGOGASTRODUODENOSCOPY  2013   Dr. Riley Cheadle: Cervical esophageal web and Schatzki's ring s/p dilation, small hiatal hernia, antral and duodenal erosions likely NSAID effect, chronic duodenitis on path    ESOPHAGOGASTRODUODENOSCOPY (EGD) WITH PROPOFOL  N/A 03/21/2018   Dr. Riley Cheadle: mild Schatzki ring s/p dilation   FOREIGN BODY REMOVAL Right 10/06/2013   Procedure: REMOVAL FOREIGN BODY EXTREMITY;  Surgeon: Darrin Emerald, MD;  Location: [facility];  Service: Orthopedics;  Laterality: Right;   INJECTION KNEE Left 10/06/2013   Procedure: KNEE INJECTION;  Surgeon: Darrin Emerald, MD;  Location: [facility];  Service: Orthopedics;  Laterality: Left;   JOINT REPLACEMENT Bilateral    knees   KNEE ARTHROSCOPY Right    KNEE ARTHROSCOPY WITH LATERAL MENISECTOMY Right 10/06/2013   Procedure: KNEE ARTHROSCOPY WITH LATERAL AND MEDIAL MENISECTOMY;  Surgeon: Darrin Emerald, MD;  Location: [facility];  Service: Orthopedics;  Laterality: Right;  END @ 1234   lumpectomy on pelvis     "mass of nerves"   MALONEY DILATION N/A 03/21/2018   Procedure: MALONEY DILATION;  Surgeon: Suzette Espy, MD;  Location: [facility];  Service: Endoscopy;  Laterality: N/A;   MASTECTOMY COMPLETE / SIMPLE W/ SENTINEL NODE BIOPSY Right 11/20/2014   axillary   REMOVAL OF TISSUE EXPANDER AND PLACEMENT OF IMPLANT Right 12/13/2014   Procedure: REMOVAL OF RIGHT BREAST TISSUE EXPANDER;  Surgeon: Elidia Grout, MD;  Location: [facility] OR;  Service: Plastics;  Laterality: Right;   ROBOTIC ASSITED PARTIAL NEPHRECTOMY Right 01/08/2016   Procedure: XI ROBOTIC ASSITED PARTIAL NEPHRECTOMY;  Surgeon: Osborn Blaze, MD;  Location: [facility];  Service: Urology;  Laterality: Right;   SIMPLE MASTECTOMY WITH AXILLARY SENTINEL NODE BIOPSY Right 11/20/2014    Procedure: SIMPLE MASTECTOMY WITH AXILLARY SENTINEL NODE BIOPSY;  Surgeon: Sim Dryer, MD;  Location: [facility] OR;  Service: General;  Laterality: Right;   TISSUE EXPANDER REMOVAL Right 12/13/2014   dr Hobart Lulas   TONSILLECTOMY     TOTAL KNEE ARTHROPLASTY Right 01/24/2014   Procedure: TOTAL KNEE ARTHROPLASTY;  Surgeon: Darrin Emerald, MD;  Location: [facility];  Service: Orthopedics;  Laterality: Right;   TOTAL KNEE ARTHROPLASTY Left 08/08/2014   Procedure:  TOTAL KNEE ARTHROPLASTY;  Surgeon: Darrin Emerald, MD;  Location: [facility];  Service: Orthopedics;  Laterality: Left;   TUBAL LIGATION     UPPER GASTROINTESTINAL ENDOSCOPY     Family History Family History  Problem Relation Age of Onset   Stroke Sister    Heart attack Sister        In her 1s   Osteoarthritis Sister    Heart attack Sister        In her 34s   Osteoarthritis Brother    Obesity Brother    Heart attack Sister        In her 84s   Heart attack Father        He died at age 50   Lung cancer Maternal Grandmother        non-smoker   Colon cancer Neg Hx        Mother died at 49 from blood clot, MI    Social History Social History   Tobacco Use   Smoking status: Former    Current packs/day: 0.00    Average packs/day: 1 pack/day for 27.0 years (27.0 ttl pk-yrs)    Types: Cigarettes    Start date: 12/27/1958    Quit date: 12/26/1985    Years since quitting: 38.1   Smokeless tobacco: Never   Tobacco comments:    Quit in 1987  Vaping Use   Vaping status: Never Used  Substance Use Topics   Alcohol use: Yes    Alcohol/week: 0.0 standard drinks of alcohol    Comment: occasional wine   Drug use: No   Allergies Propoxyphene hcl, Pollen extract, Camphor, Dust mite extract, Latex, Molds & smuts, and Tomato  Review of Systems Review of Systems  All other systems reviewed and are negative.   Physical Exam Vital Signs  I have reviewed the triage vital signs BP (!) 110/57   Pulse 60   Temp 97.9 F (36.6 C) (Oral)    Resp 11   Ht 5\' 6"  (1.676 m)   Wt 93 kg   SpO2 95%   BMI 33.09 kg/m  Physical Exam Vitals and nursing note reviewed.  Constitutional:      General: She is not in acute distress.    Appearance: She is well-developed.  HENT:     Head: Normocephalic and atraumatic.     Mouth/Throat:     Mouth: Mucous membranes are moist.  Eyes:     Pupils: Pupils are equal, round, and reactive to light.  Cardiovascular:     Rate and Rhythm: Normal rate and regular rhythm.     Pulses:          Radial pulses are 2+ on the right side and 2+ on the left side.     Heart sounds: No murmur heard. Pulmonary:     Effort: Pulmonary effort is normal. No respiratory distress.     Breath sounds: Normal breath sounds.  Abdominal:     General: Abdomen is flat.     Palpations: Abdomen is soft.     Tenderness: There is no abdominal tenderness.  Musculoskeletal:        General: No tenderness.     Right lower leg: No edema.     Left lower leg: No edema.  Skin:    General: Skin is warm and dry.  Neurological:     General: No focal deficit present.     Mental Status: She is alert. Mental status is at baseline.  Psychiatric:  Mood and Affect: Mood normal.        Behavior: Behavior normal.     ED Results and Treatments Labs (all labs ordered are listed, but only abnormal results are displayed) Labs Reviewed  COMPREHENSIVE METABOLIC PANEL WITH GFR - Abnormal; Notable for the following components:      Result Value   Potassium 2.9 (*)    Glucose, Bld 120 (*)    Creatinine, Ser 1.23 (*)    Calcium  7.7 (*)    Total Protein 5.4 (*)    Albumin  2.1 (*)    GFR, Estimated 46 (*)    All other components within normal limits  CBC WITH DIFFERENTIAL/PLATELET  TROPONIN I (HIGH SENSITIVITY)  TROPONIN I (HIGH SENSITIVITY)                                                                                                                          Radiology DG Chest 2 View Result Date: 02/24/2024 CLINICAL DATA:   Sudden onset chest pain and shortness of breath. Vomiting. EXAM: CHEST - 2 VIEW COMPARISON:  12/20/2022 FINDINGS: Cardiac enlargement. No vascular congestion, edema, or consolidation in the lungs. No pleural effusion or pneumothorax. Mediastinal contours appear intact. Degenerative changes in the spine and shoulders. IMPRESSION: No active cardiopulmonary disease. Electronically Signed   By: Boyce Byes M.D.   On: 02/24/2024 20:41    Pertinent labs & imaging results that were available during my care of the patient were reviewed by me and considered in my medical decision making (see MDM for details).  Medications Ordered in ED Medications  ondansetron  (ZOFRAN ) injection 4 mg (4 mg Intravenous Given 02/24/24 2126)  acetaminophen  (TYLENOL ) tablet 1,000 mg (1,000 mg Oral Given 02/24/24 2126)  sodium chloride  0.9 % bolus 1,000 mL (0 mLs Intravenous Stopped 02/24/24 2225)  potassium chloride  SA (KLOR-CON  M) CR tablet 40 mEq (40 mEq Oral Given 02/24/24 2126)                                                                                                                                     Procedures Procedures  (including critical care time)  Medical Decision Making / ED Course   MDM:  78 year old presenting to the emergency department with episode of chest pain.  Patient well-appearing, physical examination without focal finding, equal pulses.  EKG shows sinus rhythm, appears similar to previous without acute change.  Considered ACS,  troponin negative x 2.  Patient's symptoms have resolved.  Given that she is now at baseline, feel that patient is stable for discharge to home from this perspective.  Will place cardiology referral.  Recommended she return for any recurrent symptoms.  Considered other cause of patient's symptoms such as dissection, chest x-ray stable, equal pulses, no radiation, symptoms have resolved, lower concern for this.  Considered pulmonary embolism, patient denies any recent  travel or surgery, pleuritic pain, hypoxia, shortness of breath.  No ongoing nausea, vomiting to suggest esophageal perforation and symptoms have resolved.  Chest x-ray with no sign of pneumothorax or pneumonia.  Will discharge patient to home. All questions answered. Patient comfortable with plan of discharge. Return precautions discussed with patient and specified on the after visit summary.       Additional history obtained: -Additional history obtained from ems and spouse -External records from outside source obtained and reviewed including: Chart review including previous notes, labs, imaging, consultation notes including prior notes    Lab Tests: -I ordered, reviewed, and interpreted labs.   The pertinent results include:   Labs Reviewed  COMPREHENSIVE METABOLIC PANEL WITH GFR - Abnormal; Notable for the following components:      Result Value   Potassium 2.9 (*)    Glucose, Bld 120 (*)    Creatinine, Ser 1.23 (*)    Calcium  7.7 (*)    Total Protein 5.4 (*)    Albumin  2.1 (*)    GFR, Estimated 46 (*)    All other components within normal limits  CBC WITH DIFFERENTIAL/PLATELET  TROPONIN I (HIGH SENSITIVITY)  TROPONIN I (HIGH SENSITIVITY)    Notable for mild hypokalemia   EKG   EKG Interpretation Date/Time:  Thursday February 24 2024 20:04:04 EDT Ventricular Rate:  59 PR Interval:  145 QRS Duration:  89 QT Interval:  435 QTC Calculation: 431 R Axis:   -57  Text Interpretation: Sinus rhythm Probable left atrial enlargement Left anterior fascicular block Probable anterior infarct, age indeterminate Confirmed by Hiawatha Lout ([id number]) on 02/24/2024 11:05:58 PM         Imaging Studies ordered: I ordered imaging studies including CXR On my interpretation imaging demonstrates no acute process I independently visualized and interpreted imaging. I agree with the radiologist interpretation   Medicines ordered and prescription drug management: Meds ordered this  encounter  Medications   ondansetron  (ZOFRAN ) injection 4 mg   acetaminophen  (TYLENOL ) tablet 1,000 mg   sodium chloride  0.9 % bolus 1,000 mL   potassium chloride  SA (KLOR-CON  M) CR tablet 40 mEq    -I have reviewed the patients home medicines and have made adjustments as needed   Social Determinants of Health:  Diagnosis or treatment significantly limited by social determinants of health: obesity   Reevaluation: After the interventions noted above, I reevaluated the patient and found that their symptoms have improved  Co morbidities that complicate the patient evaluation  Past Medical History:  Diagnosis Date   Allergic rhinitis due to pollen    Anxiety    Arthritis    "knees, back, right elbow; left shoulder; right ankle" (11/20/2014)   Breast cancer, right breast (HCC) 2016   "DCIS; zero stage" S/P mastectomy, cancer free since then   Cervical spondylosis without myelopathy    Cervicalgia    Chronic lower back pain    Constipation    takes Miralax  daily   Degeneration of cervical intervertebral disc    Depression    Diverticulosis of colon (without mention of  hemorrhage)    GERD (gastroesophageal reflux disease)    takes Omeprazole daily   Graves' disease    "took a pill to correct"   H/O hiatal hernia    H/O urinary frequency    Headache(784.0)    denies migraines since the 80's but has occ and takes Butalbital prn   HTN (hypertension)    takes Metoprolol  and Lisinopril  daily   Insomnia    takes Ativan  nigtly   Joint pain    "all of them"   Joint swelling    "knees, legs, ankles sometimes" (11/20/2014)   Morbid obesity (HCC)    Numbness    LOWER LEGS   Other postablative hypothyroidism    takes SYnthroid  daily   Palpitations    Peptic ulcer, unspecified site, unspecified as acute or chronic, without mention of hemorrhage, perforation, or obstruction    Primary localized osteoarthrosis, lower leg    Pure hypercholesterolemia    takes Pravastin daily    Recurrent UTI    Renal cell carcinoma (HCC) 01/08/2016   Right renal mass s/p partial right nephrectomy, cancer free since 2017   Shortness of breath    Sleep apnea    slight but doesn't require a CPAP (11/20/2014)   Tendonitis of knee    bilateral      Dispostion: Disposition decision including need for hospitalization was considered, and patient discharged from emergency department.    Final Clinical Impression(s) / ED Diagnoses Final diagnoses:  Atypical chest pain     This chart was dictated using voice recognition software.  Despite best efforts to proofread,  errors can occur which can change the documentation meaning.    Mordecai Applebaum, MD 02/24/24 2308

## 2024-02-24 NOTE — ED Notes (Signed)
 Patient transported to X-ray

## 2024-02-29 ENCOUNTER — Encounter: Payer: Self-pay | Admitting: Student

## 2024-02-29 ENCOUNTER — Ambulatory Visit: Attending: Student | Admitting: Student

## 2024-02-29 VITALS — BP 120/60 | HR 57 | Ht 66.0 in | Wt 212.0 lb

## 2024-02-29 DIAGNOSIS — G4733 Obstructive sleep apnea (adult) (pediatric): Secondary | ICD-10-CM

## 2024-02-29 DIAGNOSIS — M7989 Other specified soft tissue disorders: Secondary | ICD-10-CM

## 2024-02-29 DIAGNOSIS — I10 Essential (primary) hypertension: Secondary | ICD-10-CM

## 2024-02-29 DIAGNOSIS — E785 Hyperlipidemia, unspecified: Secondary | ICD-10-CM

## 2024-02-29 DIAGNOSIS — R600 Localized edema: Secondary | ICD-10-CM

## 2024-02-29 DIAGNOSIS — R002 Palpitations: Secondary | ICD-10-CM | POA: Diagnosis not present

## 2024-02-29 DIAGNOSIS — I471 Supraventricular tachycardia, unspecified: Secondary | ICD-10-CM

## 2024-02-29 DIAGNOSIS — Z79899 Other long term (current) drug therapy: Secondary | ICD-10-CM | POA: Diagnosis not present

## 2024-02-29 DIAGNOSIS — I2510 Atherosclerotic heart disease of native coronary artery without angina pectoris: Secondary | ICD-10-CM

## 2024-02-29 MED ORDER — POTASSIUM CHLORIDE 20 MEQ PO PACK: 20.0000 meq | PACK | Freq: Two times a day (BID) | ORAL | 1 refills | Status: DC

## 2024-02-29 MED ORDER — AMLODIPINE BESYLATE 2.5 MG PO TABS: 2.5000 mg | ORAL_TABLET | Freq: Every day | ORAL | 11 refills | Status: DC

## 2024-02-29 NOTE — Progress Notes (Unsigned)
 Cardiology Office Note    Date:  03/01/2024  ID:  Sara [name], DOB Feb 06, 1946, MRN [id number] Cardiologist: Sara Schuller, MD    History of Present Illness:    Sara [name] is a 78 y.o. female  with past medical history of syncope, HTN, HLD, SVT, history of OSA (on CPAP), Stage III CKD and CAD (s/p Coronary CT in 02/2020 showing 50 to 74% stenosis along mid RCA but likely overestimated with normal FFR and minimal plaque elsewhere, cardiac PET in 04/2023 showing no evidence of ischemia and a low-risk study) who presents to the office today for follow-up from a recent Emergency Department visit.  She was last examined by Dr. Loetta [name] in 10/2023 and reported still having occasional palpitations despite dose adjustment of Toprol -XL to 37.5 mg daily at the time of her last office visit. It was recommended to increase Toprol -XL to 37.5 mg in AM/12.5 mg in PM and continue her current medications otherwise.  In the interim, she did present to [facility] ED on 02/24/2024 for evaluation of chest pain and shortness of breath. Symptoms occurred while sitting at church. Hs troponin values were negative at 9 and 8 and EKG showed no acute ST changes. Was discharged home and informed to follow-up with Cardiology as an outpatient.   In talking with the patient and her husband today, she reports developing chest pain the day of ED evaluation while at [employer] study. Reports she felt like she could not catch her breath and could barely talk at that time. Denies any recurrent episodes like that since. She does report occasional, twinges of chest pain which can last for approximately 30 seconds and then resolve. Symptoms occur at rest and can be along different areas of her chest or arms. No specific association with exertion. She does experience intermittent lower extremity edema and takes Torsemide  60 mg daily for this. No specific orthopnea or PND. Uses her CPAP at night.  Studies Reviewed:   EKG: EKG is not  ordered today. EKG from 02/24/2024 is reviewed and demonstrates sinus bradycardia, heart rate 59 with LAFB.  No acute ST changes.  Echocardiogram: 10/2022 IMPRESSIONS     1. Left ventricular ejection fraction, by estimation, is 70 to 75%. The  left ventricle has hyperdynamic function. The left ventricle has no  regional wall motion abnormalities. Left ventricular diastolic parameters  are consistent with Grade I diastolic  dysfunction (impaired relaxation).   2. Right ventricular systolic function was not well visualized. The right  ventricular size is normal. There is normal pulmonary artery systolic  pressure. The estimated right ventricular systolic pressure is 22.2 mmHg.   3. A small pericardial effusion is present. The pericardial effusion is  lateral to the left ventricle and localized near the right atrium.   4. The mitral valve is normal in structure. No evidence of mitral valve  regurgitation. No evidence of mitral stenosis.   5. The aortic valve is tricuspid. Aortic valve regurgitation is not  visualized. No aortic stenosis is present.   6. The inferior vena cava is normal in size with greater than 50%  respiratory variability, suggesting right atrial pressure of 3 mmHg.   Comparison(s): No significant change from prior study.    Limited Echo: 12/2022 IMPRESSIONS     1. Left ventricular ejection fraction, by estimation, is 65 to 70%. The  left ventricle has normal function. Left ventricular diastolic parameters  are consistent with Grade I diastolic dysfunction (impaired relaxation).   2. A small pericardial  effusion is present. The pericardial effusion is  lateral to the left ventricle, localized near the right atrium and  posterior to the left ventricle. There is no evidence of cardiac  tamponade.   3. The inferior vena cava is normal in size with greater than 50%  respiratory variability, suggesting right atrial pressure of 3 mmHg.   Comparison(s): No significant  change from prior study. Stable pericardial  effusion.   Cardiac PET: 04/2023   The study is normal. The study is low risk.   EKG was not recorded during study   LV perfusion is normal. There is no evidence of ischemia. There is no evidence of infarction.   Rest left ventricular function is normal. Rest EF: 74%. Stress left ventricular function is normal. Stress EF: 76%. End diastolic cavity size is normal. End systolic cavity size is normal.   Myocardial blood flow was computed to be 0.49ml/g/min at rest and 1.29ml/g/min at stress. Global myocardial blood flow reserve was 2.05 and was normal.   Coronary calcium  was present on the attenuation correction CT images. Mild coronary calcifications were present. Coronary calcifications were present in the left anterior descending artery and right coronary artery distribution(s).   Electronically signed by Sara Clara, MD  cMRI: 07/2023 IMPRESSION: 1.  Normal LV size and systolic function.  LVEF 64%.   2.  No LV LGE or scar.   3.  Normal LV ECV, no evidence for myocardial infiltration.   4.  Normal RV size and systolic function.   5   Small pericardial effusion.   6.  No significant valvular abnormalities.  Physical Exam:   VS:  BP 120/60 (BP Location: Left Arm, Cuff Size: Large)   Pulse (!) 57   Ht 5' 6 (1.676 m)   Wt 212 lb (96.2 kg)   SpO2 96%   BMI 34.22 kg/m    Wt Readings from Last 3 Encounters:  02/29/24 212 lb (96.2 kg)  02/24/24 205 lb 0.4 oz (93 kg)  02/08/24 205 lb (93 kg)     GEN: Well nourished, well developed female appearing in no acute distress NECK: No JVD; No carotid bruits CARDIAC: RRR, no murmurs, rubs, gallops RESPIRATORY:  Clear to auscultation without rales, wheezing or rhonchi  ABDOMEN: Appears non-distended. No obvious abdominal masses. EXTREMITIES: No clubbing or cyanosis. Trace ankle edema bilaterally.  Distal pedal pulses are 2+ bilaterally.   Assessment and Plan:   1. Coronary artery  disease involving native coronary artery of native heart without angina pectoris - Recent Cardiac PET in 04/2023 showed no evidence of infarction or ischemia and was overall a low-risk study with only mild coronary calcifications. She ruled out for ACS during recent ED evaluation and pain was overall felt to be atypical for a cardiac etiology at that time. Her occasional, twinges of pain lasting for less than 30 seconds are overall atypical for angina.   - We reviewed the possibility of a spasm and will plan to add Amlodipine  2.5 mg daily to see if this helps with symptoms. If no improvement after a few weeks, could discontinue. Reviewed that her cardiac workup over the past few years has overall been reassuring. If she continues to have progressive symptoms despite medical therapy, could consider a cardiac catheterization for definitive evaluation but she wishes to hold off on this for now and I think this is very reasonable given her atypical symptoms.  2. Palpitations/SVT (supraventricular tachycardia) (HCC) - She reports symptoms did improve with prior dose adjustment of Toprol -XL.  Continue current medical therapy with Toprol -XL 37.5 mg in AM/12.5 mg in PM.  3. Essential hypertension - BP is well-controlled at 120/60 during today's visit. Continue Toprol -XL with dosing as described above.  Will add low-dose Amlodipine  as discussed above.  4. Hyperlipidemia LDL goal <70 - Followed by her PCP. Will request a copy of most recent labs. Remains on Atorvastatin  20 mg daily.  5. OSA - She does use her CPAP on a nightly basis. Continued compliance encouraged.  6. Lower Extremity Edema/Hypokalemia - She only has trace lower extremity edema on examination today. Will continue with Torsemide  60 mg daily. She reports difficulty with taking her potassium tablets and prefers to try packets. Given that she had hypokalemia with K+ of 2.9 on 02/24/2024, will recheck this along with a BNP and Mg later this week for  reassessment.   Signed, Dorma Gash, PA-C

## 2024-02-29 NOTE — Patient Instructions (Signed)
 Medication Instructions:   Start Taking Amlodipine  2.5 mg Daily   *If you need a refill on your cardiac medications before your next appointment, please call your pharmacy*  Lab Work: Your physician recommends that you return for lab work this week. ( BMET, BNP, Mg)   If you have labs (blood work) drawn today and your tests are completely normal, you will receive your results only by: MyChart Message (if you have MyChart) OR A paper copy in the mail If you have any lab test that is abnormal or we need to change your treatment, we will call you to review the results.  Testing/Procedures: NONE    Follow-Up: At [facility], you and your health needs are our priority.  As part of our continuing mission to provide you with exceptional heart care, our providers are all part of one team.  This team includes your primary Cardiologist (physician) and Advanced Practice Providers or APPs (Physician Assistants and Nurse Practitioners) who all work together to provide you with the care you need, when you need it.  Your next appointment:   3 -4 month(s)  Provider:   Vishnu Mallipeddi, MD or Woodfin Hays, PA-C    We recommend signing up for the patient portal called "MyChart".  Sign up information is provided on this After Visit Summary.  MyChart is used to connect with patients for Virtual Visits (Telemedicine).  Patients are able to view lab/test results, encounter notes, upcoming appointments, etc.  Non-urgent messages can be sent to your provider as well.   To learn more about what you can do with MyChart, go to [email or web address].   Other Instructions Thank you for choosing [facility]!

## 2024-03-01 ENCOUNTER — Encounter: Payer: Self-pay | Admitting: Student

## 2024-03-04 ENCOUNTER — Emergency Department (HOSPITAL_COMMUNITY)
Admission: EM | Admit: 2024-03-04 | Discharge: 2024-03-05 | Disposition: A | Discharge status: Home / Self Care | Attending: Emergency Medicine | Admitting: Emergency Medicine

## 2024-03-04 ENCOUNTER — Other Ambulatory Visit: Payer: Self-pay

## 2024-03-04 ENCOUNTER — Encounter (HOSPITAL_COMMUNITY): Payer: Self-pay

## 2024-03-04 ENCOUNTER — Emergency Department (HOSPITAL_COMMUNITY)

## 2024-03-04 DIAGNOSIS — H538 Other visual disturbances: Secondary | ICD-10-CM | POA: Diagnosis not present

## 2024-03-04 DIAGNOSIS — H04302 Unspecified dacryocystitis of left lacrimal passage: Secondary | ICD-10-CM | POA: Insufficient documentation

## 2024-03-04 DIAGNOSIS — Z91040 Latex allergy status: Secondary | ICD-10-CM | POA: Diagnosis not present

## 2024-03-04 DIAGNOSIS — H5712 Ocular pain, left eye: Secondary | ICD-10-CM | POA: Diagnosis not present

## 2024-03-04 DIAGNOSIS — R519 Headache, unspecified: Secondary | ICD-10-CM | POA: Diagnosis not present

## 2024-03-04 DIAGNOSIS — I10 Essential (primary) hypertension: Secondary | ICD-10-CM | POA: Diagnosis not present

## 2024-03-04 DIAGNOSIS — Z0389 Encounter for observation for other suspected diseases and conditions ruled out: Secondary | ICD-10-CM | POA: Diagnosis not present

## 2024-03-04 DIAGNOSIS — R030 Elevated blood-pressure reading, without diagnosis of hypertension: Secondary | ICD-10-CM | POA: Diagnosis not present

## 2024-03-04 DIAGNOSIS — Z79899 Other long term (current) drug therapy: Secondary | ICD-10-CM | POA: Diagnosis not present

## 2024-03-04 DIAGNOSIS — Z7982 Long term (current) use of aspirin: Secondary | ICD-10-CM | POA: Insufficient documentation

## 2024-03-04 LAB — CBC WITH DIFFERENTIAL/PLATELET
Abs Immature Granulocytes: 0.03 10*3/uL (ref 0.00–0.07)
Basophils Absolute: 0.1 10*3/uL (ref 0.0–0.1)
Basophils Relative: 0 %
Eosinophils Absolute: 0.1 10*3/uL (ref 0.0–0.5)
Eosinophils Relative: 1 %
HCT: 42.4 % (ref 36.0–46.0)
Hemoglobin: 13.9 g/dL (ref 12.0–15.0)
Immature Granulocytes: 0 %
Lymphocytes Relative: 24 %
Lymphs Abs: 2.7 10*3/uL (ref 0.7–4.0)
MCH: 31.7 pg (ref 26.0–34.0)
MCHC: 32.8 g/dL (ref 30.0–36.0)
MCV: 96.8 fL (ref 80.0–100.0)
Monocytes Absolute: 0.7 10*3/uL (ref 0.1–1.0)
Monocytes Relative: 6 %
Neutro Abs: 7.9 10*3/uL — ABNORMAL HIGH (ref 1.7–7.7)
Neutrophils Relative %: 69 %
Platelets: 210 10*3/uL (ref 150–400)
RBC: 4.38 MIL/uL (ref 3.87–5.11)
RDW: 13.7 % (ref 11.5–15.5)
WBC: 11.5 10*3/uL — ABNORMAL HIGH (ref 4.0–10.5)
nRBC: 0 % (ref 0.0–0.2)

## 2024-03-04 LAB — BASIC METABOLIC PANEL WITH GFR
Anion gap: 12 (ref 5–15)
BUN: 20 mg/dL (ref 8–23)
CO2: 29 mmol/L (ref 22–32)
Calcium: 8.3 mg/dL — ABNORMAL LOW (ref 8.9–10.3)
Chloride: 99 mmol/L (ref 98–111)
Creatinine, Ser: 1.11 mg/dL — ABNORMAL HIGH (ref 0.44–1.00)
GFR, Estimated: 52 mL/min — ABNORMAL LOW (ref >60)
Glucose, Bld: 96 mg/dL (ref 70–99)
Potassium: 3.7 mmol/L (ref 3.5–5.1)
Sodium: 140 mmol/L (ref 135–145)

## 2024-03-04 MED ORDER — IBUPROFEN 400 MG PO TABS
600.0000 mg | ORAL_TABLET | Freq: Once | ORAL | Status: AC
Start: 2024-03-04 — End: 2024-03-04
  Filled 2024-03-04: qty 2

## 2024-03-04 MED ORDER — TETRACAINE HCL 0.5 % OP SOLN
2.0000 [drp] | Freq: Once | OPHTHALMIC | Status: AC
  Administered 2024-03-04: 2 [drp] via OPHTHALMIC
  Filled 2024-03-04: qty 4

## 2024-03-04 MED ORDER — IOHEXOL 300 MG/ML  SOLN: 75.0000 mL | Freq: Once | INTRAMUSCULAR | Status: AC | PRN

## 2024-03-04 MED ORDER — FLUORESCEIN SODIUM 1 MG OP STRP
1.0000 | ORAL_STRIP | Freq: Once | OPHTHALMIC | Status: AC
  Filled 2024-03-04: qty 1

## 2024-03-04 NOTE — ED Provider Notes (Signed)
  Provider Note MRN:  [id number]  Arrival date & time: 03/05/24    ED Course and Medical Decision Making  Assumed care of patient at sign-out or upon transfer.  Preseptal cellulitis with CT pending to exclude orbital cellulitis, anticipating discharge on antibiotics if CT reassuring.  1:30 AM update: Patient doing well on my assessment.  Still having some eye discomfort but no acute distress.  CT revealing likely dacryocystitis and possibly dacryocystocele, no evidence of orbital cellulitis.  Appropriate for discharge with ophthalmology follow-up.  Procedures  Final Clinical Impressions(s) / ED Diagnoses     ICD-10-CM   1. Dacryocystitis of left lacrimal sac  H04.302       ED Discharge Orders          Ordered    clindamycin (CLEOCIN) 300 MG capsule  4 times daily        03/05/24 0137              Discharge Instructions      You were evaluated in the Emergency Department and after careful evaluation, we did not find any emergent condition requiring admission or further testing in the hospital.  Your exam/testing today was overall reassuring.  Symptoms seem to be due to an infection of your tear duct.  You may also have an abnormal cyst on this tear duct.  Take the clindamycin antibiotic as directed, follow-up with the ophthalmologist.  Please return to the Emergency Department if you experience any worsening of your condition.  Thank you for allowing us  to be a part of your care.       Merrick Abe. Harless Lien, MD [facility] Emergency Medicine [facility] [email or web address]    Edson Graces, MD 03/05/24 [phone]

## 2024-03-04 NOTE — ED Triage Notes (Signed)
 Pt stated that she has a bad headache and pressure behind her left eye that started yesterday. Pt also complaining of blurred vision in the left eye

## 2024-03-04 NOTE — ED Provider Notes (Signed)
 [name] EMERGENC[facility] DEPARTMENT AT [facility] Provider Note   CSN: [id number] Arrival date & time: 03/04/24  1914     Patient presents with: Eye Pain   Sara [name] is a 78 y.o. female.  She is history of hypertension, high cholesterol, GERD.  She presents to the ER today complaining of left eye pain.  States she had started with a left-sided headache yesterday and today started with pressure behind her eye and pain in the eye, having intermittent blurry vision in the left eye as well.  States due to having watering of the left eye for several weeks but the pain just started over the past 24 hours.  She does not wear contacts but does wear glasses and has history of cataract surgery in the left eye remotely.  Notes some mild swelling around the left eye as well.  She denies any injury or foreign body sensation.  Does admit to pain in the left eye with extraocular movements, particularly when she looks towards the right, denies any double vision or vision loss.  She reports swelling and redness to the left periorbital area.  She notes headache,denies jaw claudication    Eye Pain       Prior to Admission medications   Medication Sig Start Date End Date Taking? Authorizing Provider  acetaminophen  (TYLENOL ) 500 MG tablet Take 650 mg by mouth as needed for moderate pain or headache.    [provider]  amLODipine  (NORVASC ) 2.5 MG tablet Take 1 tablet (2.5 mg total) by mouth daily. 02/29/24 05/29/24  Strader, Dimple Francis, PA-C  aspirin  EC 81 MG tablet Take 81 mg by mouth daily. Swallow whole.    [provider]  atorvastatin  (LIPITOR) 20 MG tablet Take 1 tablet (20 mg total) by mouth daily. 03/07/20 02/29/24  Barbee Lew, MD  dapagliflozin propanediol (FARXIGA) 5 MG TABS tablet Take 5 mg by mouth every morning.    [provider]  gabapentin  (NEURONTIN ) 600 MG tablet Take 1 tablet (600 mg total) by mouth 2 (two) times daily. 10/25/23   Darrin Emerald, MD  levothyroxine  (SYNTHROID ) 150 MCG tablet Take 1 tablet (150 mcg total) by mouth daily. 12/23/22   Krishnan, Gokul, MD  Magnesium  400 MG CAPS Take 400 mg by mouth daily.    [provider]  metoprolol  succinate (TOPROL  XL) 25 MG 24 hr tablet Take 1 1/2 tablet in the morning (37.5 mg) and take 1/2 (12.5mg ) tablet at night. 11/15/23   Millicent Ally, MD  Multiple Vitamins-Minerals (CENTRUM SILVER PO) Take 1 tablet by mouth daily.    [provider]  omeprazole (PRILOSEC) 20 MG capsule Take 20 mg by mouth daily. May take a second 20 mg dose as needed for heartburn    [provider]  ondansetron  (ZOFRAN ) 4 MG tablet Take 1 tablet (4 mg total) by mouth every 8 (eight) hours as needed for nausea or vomiting. Patient not taking: Reported on 02/29/2024 02/08/24   Mordecai Applebaum, MD  oxyCODONE  (ROXICODONE ) 5 MG immediate release tablet Take 1 tablet (5 mg total) by mouth every 6 (six) hours as needed for severe pain (pain score 7-10). Patient not taking: Reported on 02/29/2024 02/08/24   Mordecai Applebaum, MD  potassium chloride  (KLOR-CON ) 20 MEQ packet Take 20 mEq by mouth 2 (two) times daily. 02/29/24   Strader, Dimple Francis, PA-C  torsemide  (DEMADEX ) 20 MG tablet Take 3 tablets (60 mg total) by mouth daily. 02/04/24   Loetta Ringer,  Brandy Cal, MD  Vibegron (GEMTESA) 75 MG TABS Take 75 mg by mouth daily. 02/23/24   [provider]  Vitamin D , Ergocalciferol , (DRISDOL ) 1.25 MG (50000 UNIT) CAPS capsule TAKE 1 CAPSULE BY MOUTH WEEKLY 04/05/23   Sheril Dines M, PA-C  Ranitidine HCl (ZANTAC 75 PO) Take by mouth.    12/01/11  [provider]    Allergies: Propoxyphene hcl, Pollen extract, Camphor, Dust mite extract, Latex, Molds & smuts, and Tomato    Review of Systems  Eyes:  Positive for pain.    Updated Vital Signs BP 124/62 (BP Location: Right Arm)   Pulse (!) 57   Temp 99 F (37.2 C)   Resp 18   Ht 5' 6 (1.676 m)   Wt 96 kg   SpO2 98%   BMI  34.16 kg/m   Physical Exam Vitals and nursing note reviewed.  Constitutional:      General: She is not in acute distress.    Appearance: She is well-developed.  HENT:     Head: Normocephalic and atraumatic.   Eyes:     General:        Left eye: No foreign body.     Intraocular pressure: Left eye pressure is 118 mmHg. Measurements were taken using a handheld tonometer.    Extraocular Movements: Extraocular movements intact.     Left eye: No nystagmus.     Conjunctiva/sclera:     Right eye: Right conjunctiva is not injected. No chemosis, exudate or hemorrhage.    Left eye: Left conjunctiva is injected. No chemosis, exudate or hemorrhage.    Pupils: Pupils are equal, round, and reactive to light.     Left eye: No corneal abrasion or fluorescein uptake. Seidel exam negative.    Slit lamp exam:    Left eye: No corneal ulcer or foreign body.     Comments: Mild left periorbital swelling and erythema   Cardiovascular:     Rate and Rhythm: Normal rate and regular rhythm.     Heart sounds: No murmur heard. Pulmonary:     Effort: Pulmonary effort is normal. No respiratory distress.     Breath sounds: Normal breath sounds.  Abdominal:     Palpations: Abdomen is soft.     Tenderness: There is no abdominal tenderness.   Musculoskeletal:        General: No swelling.     Cervical back: Neck supple.   Skin:    General: Skin is warm and dry.     Capillary Refill: Capillary refill takes less than 2 seconds.   Neurological:     Mental Status: She is alert.   Psychiatric:        Mood and Affect: Mood normal.     (all labs ordered are listed, but only abnormal results are displayed) Labs Reviewed - No data to display  EKG: None  Radiology: No results found.   Procedures   Medications Ordered in the ED  tetracaine  (PONTOCAINE) 0.5 % ophthalmic solution 2 drop (has no administration in time range)  fluorescein ophthalmic strip 1 strip (has no administration in time range)                                     Medical Decision Making Differential diagnosis includes but not limited to conjunctivitis, abrasion, corneal foreign body, iritis, scleritis, orbital cellulitis, orbital cellulitis, glaucoma, other  ED course: Patient is here with left  eye pain, redness and swelling with blurry vision.  It started yesterday and became worse today.  She denies fever or chills.  She has some intermittent blurry vision but no diplopia.  She does not have elevated intraocular pressure.  She does not have photophobia, specifically no consensual photophobia, so I do not feel this represents iritis.  She has surrounding periorbital swelling with redness and warmth, I suspect likely periorbital cellulitis.  Patient has significant pain with extraocular movements, especially when she looks to her right, so we will get a CT to rule out possibly orbital cellulitis.  Given ibuprofen for pain. Signed out to Dr. Harless Lien   Amount and/or Complexity of Data Reviewed Labs: ordered. Radiology: ordered.  Risk Prescription drug management.        Final diagnoses:  None    ED Discharge Orders     None          Joshua Nieves 03/04/24 2316    Cheyenne Cotta, MD 03/08/24 1149

## 2024-03-04 NOTE — ED Notes (Signed)
 Patient transported to CT

## 2024-03-05 MED ORDER — CLINDAMYCIN HCL 150 MG PO CAPS
300.0000 mg | ORAL_CAPSULE | Freq: Once | ORAL | Status: AC
  Administered 2024-03-05: 300 mg via ORAL
  Filled 2024-03-05: qty 2

## 2024-03-05 MED ORDER — OXYCODONE HCL 5 MG PO TABS
5.0000 mg | ORAL_TABLET | Freq: Once | ORAL | Status: AC
  Administered 2024-03-05: 5 mg via ORAL
  Filled 2024-03-05: qty 1

## 2024-03-05 MED ORDER — CLINDAMYCIN HCL 300 MG PO CAPS: 300.0000 mg | ORAL_CAPSULE | Freq: Four times a day (QID) | ORAL | 0 refills | Status: AC

## 2024-03-05 NOTE — ED Notes (Signed)
 Pt's husband called upset because pt did not receive pain medication prescription at discharge. I informed him she could take tylenol  or motrin

## 2024-03-05 NOTE — Discharge Instructions (Addendum)
 You were evaluated in the Emergency Department and after careful evaluation, we did not find any emergent condition requiring admission or further testing in the hospital.  Your exam/testing today was overall reassuring.  Symptoms seem to be due to an infection of your tear duct.  You may also have an abnormal cyst on this tear duct.  Take the clindamycin antibiotic as directed, follow-up with the ophthalmologist.  Please return to the Emergency Department if you experience any worsening of your condition.  Thank you for allowing us  to be a part of your care.

## 2024-03-06 DIAGNOSIS — H04322 Acute dacryocystitis of left lacrimal passage: Secondary | ICD-10-CM | POA: Diagnosis not present

## 2024-03-08 DIAGNOSIS — M7989 Other specified soft tissue disorders: Secondary | ICD-10-CM | POA: Diagnosis not present

## 2024-03-08 DIAGNOSIS — Z79899 Other long term (current) drug therapy: Secondary | ICD-10-CM | POA: Diagnosis not present

## 2024-03-08 DIAGNOSIS — R600 Localized edema: Secondary | ICD-10-CM | POA: Diagnosis not present

## 2024-03-10 ENCOUNTER — Ambulatory Visit: Payer: Self-pay | Admitting: Student

## 2024-03-10 LAB — MAGNESIUM: Magnesium: 2.4 mg/dL — ABNORMAL HIGH (ref 1.6–2.3)

## 2024-03-10 LAB — BASIC METABOLIC PANEL WITH GFR
BUN/Creatinine Ratio: 14 (ref 12–28)
BUN: 20 mg/dL (ref 8–27)
CO2: 28 mmol/L (ref 20–29)
Calcium: 8.7 mg/dL (ref 8.7–10.3)
Chloride: 102 mmol/L (ref 96–106)
Creatinine, Ser: 1.39 mg/dL — ABNORMAL HIGH (ref 0.57–1.00)
Glucose: 84 mg/dL (ref 70–99)
Potassium: 4.7 mmol/L (ref 3.5–5.2)
Sodium: 145 mmol/L — ABNORMAL HIGH (ref 134–144)
eGFR: 39 mL/min/{1.73_m2} — ABNORMAL LOW (ref >59)

## 2024-03-10 LAB — BRAIN NATRIURETIC PEPTIDE: BNP: 106.2 pg/mL — ABNORMAL HIGH (ref 0.0–100.0)

## 2024-03-13 DIAGNOSIS — R8290 Unspecified abnormal findings in urine: Secondary | ICD-10-CM | POA: Diagnosis not present

## 2024-03-13 DIAGNOSIS — R809 Proteinuria, unspecified: Secondary | ICD-10-CM | POA: Diagnosis not present

## 2024-03-13 DIAGNOSIS — E876 Hypokalemia: Secondary | ICD-10-CM | POA: Diagnosis not present

## 2024-03-13 DIAGNOSIS — N1831 Chronic kidney disease, stage 3a: Secondary | ICD-10-CM | POA: Diagnosis not present

## 2024-03-13 DIAGNOSIS — E785 Hyperlipidemia, unspecified: Secondary | ICD-10-CM | POA: Diagnosis not present

## 2024-03-13 DIAGNOSIS — I10 Essential (primary) hypertension: Secondary | ICD-10-CM | POA: Diagnosis not present

## 2024-03-29 DIAGNOSIS — E785 Hyperlipidemia, unspecified: Secondary | ICD-10-CM | POA: Diagnosis not present

## 2024-03-29 DIAGNOSIS — N1831 Chronic kidney disease, stage 3a: Secondary | ICD-10-CM | POA: Diagnosis not present

## 2024-03-29 DIAGNOSIS — I10 Essential (primary) hypertension: Secondary | ICD-10-CM | POA: Diagnosis not present

## 2024-03-29 DIAGNOSIS — R809 Proteinuria, unspecified: Secondary | ICD-10-CM | POA: Diagnosis not present

## 2024-03-29 DIAGNOSIS — R8290 Unspecified abnormal findings in urine: Secondary | ICD-10-CM | POA: Diagnosis not present

## 2024-04-03 DIAGNOSIS — E785 Hyperlipidemia, unspecified: Secondary | ICD-10-CM | POA: Diagnosis not present

## 2024-04-03 DIAGNOSIS — I10 Essential (primary) hypertension: Secondary | ICD-10-CM | POA: Diagnosis not present

## 2024-04-03 DIAGNOSIS — R809 Proteinuria, unspecified: Secondary | ICD-10-CM | POA: Diagnosis not present

## 2024-04-03 DIAGNOSIS — E876 Hypokalemia: Secondary | ICD-10-CM | POA: Diagnosis not present

## 2024-04-03 DIAGNOSIS — N1831 Chronic kidney disease, stage 3a: Secondary | ICD-10-CM | POA: Diagnosis not present

## 2024-04-04 ENCOUNTER — Other Ambulatory Visit: Payer: Self-pay | Admitting: Physician Assistant

## 2024-04-04 DIAGNOSIS — E7800 Pure hypercholesterolemia, unspecified: Secondary | ICD-10-CM | POA: Diagnosis not present

## 2024-04-04 DIAGNOSIS — I10 Essential (primary) hypertension: Secondary | ICD-10-CM | POA: Diagnosis not present

## 2024-04-04 DIAGNOSIS — N3281 Overactive bladder: Secondary | ICD-10-CM | POA: Diagnosis not present

## 2024-04-04 DIAGNOSIS — N1831 Chronic kidney disease, stage 3a: Secondary | ICD-10-CM | POA: Diagnosis not present

## 2024-04-04 DIAGNOSIS — H10401 Unspecified chronic conjunctivitis, right eye: Secondary | ICD-10-CM | POA: Diagnosis not present

## 2024-04-04 DIAGNOSIS — E559 Vitamin D deficiency, unspecified: Secondary | ICD-10-CM

## 2024-04-04 DIAGNOSIS — I129 Hypertensive chronic kidney disease with stage 1 through stage 4 chronic kidney disease, or unspecified chronic kidney disease: Secondary | ICD-10-CM | POA: Diagnosis not present

## 2024-04-04 DIAGNOSIS — M150 Primary generalized (osteo)arthritis: Secondary | ICD-10-CM | POA: Diagnosis not present

## 2024-04-04 DIAGNOSIS — G4733 Obstructive sleep apnea (adult) (pediatric): Secondary | ICD-10-CM | POA: Diagnosis not present

## 2024-04-04 DIAGNOSIS — E039 Hypothyroidism, unspecified: Secondary | ICD-10-CM | POA: Diagnosis not present

## 2024-04-05 ENCOUNTER — Other Ambulatory Visit: Payer: Self-pay | Admitting: *Deleted

## 2024-04-05 ENCOUNTER — Other Ambulatory Visit (HOSPITAL_COMMUNITY): Payer: Self-pay | Admitting: Nephrology

## 2024-04-05 DIAGNOSIS — N1831 Chronic kidney disease, stage 3a: Secondary | ICD-10-CM

## 2024-04-05 DIAGNOSIS — E559 Vitamin D deficiency, unspecified: Secondary | ICD-10-CM

## 2024-04-05 DIAGNOSIS — I10 Essential (primary) hypertension: Secondary | ICD-10-CM

## 2024-04-05 MED ORDER — VITAMIN D (ERGOCALCIFEROL) 1.25 MG (50000 UNIT) PO CAPS: 50000.0000 [IU] | ORAL_CAPSULE | ORAL | 3 refills | Status: AC

## 2024-04-05 MED ORDER — VITAMIN D (ERGOCALCIFEROL) 1.25 MG (50000 UNIT) PO CAPS: 50000.0000 [IU] | ORAL_CAPSULE | ORAL | 3 refills | Status: DC

## 2024-04-07 ENCOUNTER — Other Ambulatory Visit: Payer: Self-pay

## 2024-04-07 MED ORDER — POTASSIUM CHLORIDE 20 MEQ PO PACK: 20.0000 meq | PACK | Freq: Two times a day (BID) | ORAL | 3 refills | Status: AC

## 2024-04-07 NOTE — Progress Notes (Unsigned)
 Luverne Aran, MD  Baldwin Channing CROME OK for random renal biopsy in US  w/ sedation. These do not require approval if random and coming from Nephrology.  GY       Previous Messages    ----- Message ----- From: Baldwin Channing CROME Sent: 04/05/2024   3:40 PM EDT To: Channing CROME Baldwin; Ir Procedure Requests Subject: US  BIOPSY (KIDNEY)                            Procedure :US  BIOPSY (KIDNEY)  Reason :Proteinuria,not otherwise specified Dx: Chronic kidney disease, stage 3a, Benign essential hypertensio    History :CT Orbits W Contrast,DG Chest 2 View,DG Chest 2 View,CT Head Wo Contrast,US  RENAL ,CT HEAD WO CONTRAST (5MM),MM 3D SCREEN BREAST UNI LEFT  Provider: Douglas Rule, MD  Provider contact ;  [phone]

## 2024-04-24 ENCOUNTER — Other Ambulatory Visit: Payer: Self-pay | Admitting: Radiology

## 2024-04-24 DIAGNOSIS — R809 Proteinuria, unspecified: Secondary | ICD-10-CM | POA: Diagnosis not present

## 2024-04-24 DIAGNOSIS — E876 Hypokalemia: Secondary | ICD-10-CM | POA: Diagnosis not present

## 2024-04-24 DIAGNOSIS — N1831 Chronic kidney disease, stage 3a: Secondary | ICD-10-CM | POA: Diagnosis not present

## 2024-04-24 DIAGNOSIS — E785 Hyperlipidemia, unspecified: Secondary | ICD-10-CM | POA: Diagnosis not present

## 2024-04-24 DIAGNOSIS — K760 Fatty (change of) liver, not elsewhere classified: Secondary | ICD-10-CM

## 2024-04-24 DIAGNOSIS — I10 Essential (primary) hypertension: Secondary | ICD-10-CM | POA: Diagnosis not present

## 2024-04-24 DIAGNOSIS — K769 Liver disease, unspecified: Secondary | ICD-10-CM

## 2024-04-25 ENCOUNTER — Ambulatory Visit: Admitting: Internal Medicine

## 2024-04-25 ENCOUNTER — Ambulatory Visit (HOSPITAL_COMMUNITY): Admission: RE | Admit: 2024-04-25 | Source: Ambulatory Visit

## 2024-04-25 ENCOUNTER — Encounter (HOSPITAL_COMMUNITY): Payer: Self-pay

## 2024-04-26 ENCOUNTER — Other Ambulatory Visit: Payer: Self-pay | Admitting: Nephrology

## 2024-04-26 ENCOUNTER — Other Ambulatory Visit: Payer: Self-pay | Admitting: Nurse Practitioner

## 2024-04-26 DIAGNOSIS — I10 Essential (primary) hypertension: Secondary | ICD-10-CM

## 2024-04-26 DIAGNOSIS — E785 Hyperlipidemia, unspecified: Secondary | ICD-10-CM

## 2024-04-26 DIAGNOSIS — N1831 Chronic kidney disease, stage 3a: Secondary | ICD-10-CM

## 2024-04-26 DIAGNOSIS — R809 Proteinuria, unspecified: Secondary | ICD-10-CM

## 2024-04-26 DIAGNOSIS — E876 Hypokalemia: Secondary | ICD-10-CM

## 2024-04-27 ENCOUNTER — Other Ambulatory Visit: Payer: Self-pay | Admitting: Radiology

## 2024-04-27 NOTE — H&P (Signed)
 Chief Complaint: Patient was seen in consultation today for nephrotic range proteinuria   Procedure: Kidney Biopsy  Referring Physician(s): Kolluru,Sarath  Supervising Physician: Philip Cornet  Patient Status: [facility] - Out-pt  History of Present Illness: Sara [name] is a 78 y.o. female with a history of CAD, HTN, HLD, overactive bladder, right sided breast cancer s/p mastectomy, and renal cell carcinoma s/p partial nephrectomy who has been following with Nephrology for CKD stage IIIA with proteinuria. Patient is not currently on an ACE-I/ARB due to history of not tolerating losartan . Also has not tolerated spironolactone . Patient is currently undergoing glomerulonephritis workup with request for kidney biopsy.   Patient is currently resting in bed with family at the bedside. Patient reports frequent urination secondary to torsemide , intermittent bilateral back pain, and RLQ pain that occurs every night. Denies any shortness of breath, chest pain, nausea/vomiting, fever/chills, or dysuria. NPO since midnight. All questions and concerns answered at the bedside.   Code Status: Full Code  Past Medical History:  Diagnosis Date   Allergic rhinitis due to pollen    Anxiety    Arthritis    knees, back, right elbow; left shoulder; right ankle (11/20/2014)   Breast cancer, right breast (HCC) 2016   DCIS; zero stage S/P mastectomy, cancer free since then   Cervical spondylosis without myelopathy    Cervicalgia    Chronic lower back pain    Constipation    takes Miralax  daily   Degeneration of cervical intervertebral disc    Depression    Diverticulosis of colon (without mention of hemorrhage)    GERD (gastroesophageal reflux disease)    takes Omeprazole daily   Graves' disease    took a pill to correct   H/O hiatal hernia    H/O urinary frequency    Headache(784.0)    denies migraines since the 80's but has occ and takes Butalbital prn   HTN (hypertension)    takes  Metoprolol  and Lisinopril  daily   Insomnia    takes Ativan  nigtly   Joint pain    all of them   Joint swelling    knees, legs, ankles sometimes (11/20/2014)   Morbid obesity (HCC)    Numbness    LOWER LEGS   Other postablative hypothyroidism    takes SYnthroid  daily   Palpitations    Peptic ulcer, unspecified site, unspecified as acute or chronic, without mention of hemorrhage, perforation, or obstruction    Primary localized osteoarthrosis, lower leg    Pure hypercholesterolemia    takes Pravastin daily   Recurrent UTI    Renal cell carcinoma (HCC) 01/08/2016   Right renal mass s/p partial right nephrectomy, cancer free since 2017   Shortness of breath    Sleep apnea    slight but doesn't require a CPAP (11/20/2014)   Tendonitis of knee    bilateral    Past Surgical History:  Procedure Laterality Date   ABDOMINAL HYSTERECTOMY     ANTERIOR LAT LUMBAR FUSION  05/13/2012   Procedure: ANTERIOR LATERAL LUMBAR FUSION 2 LEVELS;  Surgeon: Darina MALVA Boehringer, MD;  Location: [facility];  Service: Neurosurgery;  Laterality: Left;  Left Lumbar Three-four,Lumbar four-five Extreme Lumbar Interbody Fusion with Percutaneous Pedicle Screws   APPENDECTOMY     BREAST BIOPSY Left ~ 2014   BREAST BIOPSY Right 2015   BREAST LUMPECTOMY Left 1988   BREAST RECONSTRUCTION WITH PLACEMENT OF TISSUE EXPANDER AND FLEX HD (ACELLULAR HYDRATED DERMIS) Right 11/20/2014   BREAST RECONSTRUCTION WITH PLACEMENT OF  TISSUE EXPANDER AND FLEX HD (ACELLULAR HYDRATED DERMIS) Right 11/20/2014   Procedure: RIGHT BREAST RECONSTRUCTION WITH PLACEMENT OF TISSUE EXPANDER AND ACELLULAR DERMA MATRIX (ACELLULAR DERMA MATRIX);  Surgeon: Alm Sick, MD;  Location: [facility] OR;  Service: Plastics;  Laterality: Right;   CATARACT EXTRACTION W/ INTRAOCULAR LENS  IMPLANT, BILATERAL Bilateral    COLONOSCOPY  Sept 2008   RMR: normal rectum, left-sided diverticula, repeat in 2018   COLONOSCOPY WITH PROPOFOL  N/A 03/21/2018   Dr. Shaaron:  diverticulosis, internal grade I hemorrhoids. no future colonoscopies unless develops new symptoms   DIAGNOSTIC LAPAROSCOPY     DILATION AND CURETTAGE OF UTERUS     ESOPHAGOGASTRODUODENOSCOPY     ESOPHAGOGASTRODUODENOSCOPY  2013   Dr. Shaaron: Cervical esophageal web and Schatzki's ring s/p dilation, small hiatal hernia, antral and duodenal erosions likely NSAID effect, chronic duodenitis on path    ESOPHAGOGASTRODUODENOSCOPY (EGD) WITH PROPOFOL  N/A 03/21/2018   Dr. Shaaron: mild Schatzki ring s/p dilation   FOREIGN BODY REMOVAL Right 10/06/2013   Procedure: REMOVAL FOREIGN BODY EXTREMITY;  Surgeon: Taft FORBES Minerva, MD;  Location: [facility];  Service: Orthopedics;  Laterality: Right;   INJECTION KNEE Left 10/06/2013   Procedure: KNEE INJECTION;  Surgeon: Taft FORBES Minerva, MD;  Location: [facility];  Service: Orthopedics;  Laterality: Left;   JOINT REPLACEMENT Bilateral    knees   KNEE ARTHROSCOPY Right    KNEE ARTHROSCOPY WITH LATERAL MENISECTOMY Right 10/06/2013   Procedure: KNEE ARTHROSCOPY WITH LATERAL AND MEDIAL MENISECTOMY;  Surgeon: Taft FORBES Minerva, MD;  Location: [facility];  Service: Orthopedics;  Laterality: Right;  END @ 1234   lumpectomy on pelvis     mass of nerves   MALONEY DILATION N/A 03/21/2018   Procedure: MALONEY DILATION;  Surgeon: Shaaron Lamar HERO, MD;  Location: [facility];  Service: Endoscopy;  Laterality: N/A;   MASTECTOMY COMPLETE / SIMPLE W/ SENTINEL NODE BIOPSY Right 11/20/2014   axillary   REMOVAL OF TISSUE EXPANDER AND PLACEMENT OF IMPLANT Right 12/13/2014   Procedure: REMOVAL OF RIGHT BREAST TISSUE EXPANDER;  Surgeon: Alm Sick, MD;  Location: [facility] OR;  Service: Plastics;  Laterality: Right;   ROBOTIC ASSITED PARTIAL NEPHRECTOMY Right 01/08/2016   Procedure: XI ROBOTIC ASSITED PARTIAL NEPHRECTOMY;  Surgeon: Ricardo Likens, MD;  Location: [facility];  Service: Urology;  Laterality: Right;   SIMPLE MASTECTOMY WITH AXILLARY SENTINEL NODE BIOPSY Right 11/20/2014   Procedure: SIMPLE  MASTECTOMY WITH AXILLARY SENTINEL NODE BIOPSY;  Surgeon: Debby Shipper, MD;  Location: [facility] OR;  Service: General;  Laterality: Right;   TISSUE EXPANDER REMOVAL Right 12/13/2014   dr sick   TONSILLECTOMY     TOTAL KNEE ARTHROPLASTY Right 01/24/2014   Procedure: TOTAL KNEE ARTHROPLASTY;  Surgeon: Taft FORBES Minerva, MD;  Location: [facility];  Service: Orthopedics;  Laterality: Right;   TOTAL KNEE ARTHROPLASTY Left 08/08/2014   Procedure: TOTAL KNEE ARTHROPLASTY;  Surgeon: Taft FORBES Minerva, MD;  Location: [facility];  Service: Orthopedics;  Laterality: Left;   TUBAL LIGATION     UPPER GASTROINTESTINAL ENDOSCOPY      Allergies: Propoxyphene hcl, Pollen extract, Camphor, Dust mite extract, Latex, Molds & smuts, and Tomato  Medications: Prior to Admission medications   Medication Sig Start Date End Date Taking? Authorizing Provider  acetaminophen  (TYLENOL ) 500 MG tablet Take 650 mg by mouth as needed for moderate pain or headache.    [provider]  amLODipine  (NORVASC ) 2.5 MG tablet Take 1 tablet (2.5 mg total) by mouth daily. 02/29/24 05/29/24  Strader, Brittany  CHRISTELLA RIGGERS  aspirin  EC 81 MG tablet Take 81 mg by mouth daily. Swallow whole.    [provider]  atorvastatin  (LIPITOR) 20 MG tablet Take 1 tablet (20 mg total) by mouth daily. 03/07/20 02/29/24  Will Almarie MATSU, MD  dapagliflozin propanediol (FARXIGA) 5 MG TABS tablet Take 5 mg by mouth every morning.    [provider]  gabapentin  (NEURONTIN ) 600 MG tablet Take 1 tablet (600 mg total) by mouth 2 (two) times daily. 10/25/23   Margrette Taft BRAVO, MD  levothyroxine  (SYNTHROID ) 150 MCG tablet Take 1 tablet (150 mcg total) by mouth daily. 12/23/22   Krishnan, Gokul, MD  Magnesium  400 MG CAPS Take 400 mg by mouth daily.    [provider]  metoprolol  succinate (TOPROL  XL) 25 MG 24 hr tablet Take 1 1/2 tablet in the morning (37.5 mg) and take 1/2 (12.5mg ) tablet at night. 11/15/23   Burnard Debby LABOR, MD  Multiple  Vitamins-Minerals (CENTRUM SILVER PO) Take 1 tablet by mouth daily.    [provider]  omeprazole (PRILOSEC) 20 MG capsule Take 20 mg by mouth daily. May take a second 20 mg dose as needed for heartburn    [provider]  ondansetron  (ZOFRAN ) 4 MG tablet Take 1 tablet (4 mg total) by mouth every 8 (eight) hours as needed for nausea or vomiting. Patient not taking: Reported on 02/29/2024 02/08/24   Francesca Elsie CROME, MD  oxyCODONE  (ROXICODONE ) 5 MG immediate release tablet Take 1 tablet (5 mg total) by mouth every 6 (six) hours as needed for severe pain (pain score 7-10). Patient not taking: Reported on 02/29/2024 02/08/24   Francesca Elsie CROME, MD  potassium chloride  (KLOR-CON ) 20 MEQ packet Take 20 mEq by mouth 2 (two) times daily. 04/07/24   Strader, Laymon CHRISTELLA, PA-C  torsemide  (DEMADEX ) 20 MG tablet Take 3 tablets (60 mg total) by mouth daily. 02/04/24   Burnard Debby LABOR, MD  Vibegron (GEMTESA) 75 MG TABS Take 75 mg by mouth daily. 02/23/24   [provider]  Vitamin D , Ergocalciferol , (DRISDOL ) 1.25 MG (50000 UNIT) CAPS capsule Take 1 capsule (50,000 Units total) by mouth every 14 (fourteen) days. 04/05/24   Burns, Jennifer E, NP  Ranitidine HCl (ZANTAC 75 PO) Take by mouth.    12/01/11  [provider]     Family History  Problem Relation Age of Onset   Stroke Sister    Heart attack Sister        In her 54s   Osteoarthritis Sister    Heart attack Sister        In her 45s   Osteoarthritis Brother    Obesity Brother    Heart attack Sister        In her 46s   Heart attack Father        He died at age 77   Lung cancer Maternal Grandmother        non-smoker   Colon cancer Neg Hx        Mother died at 52 from blood clot, MI    Social History   Socioeconomic History   Marital status: Married    Spouse name: Meribeth   Number of children: 4   Years of education: Not on file   Highest education level: Not on file  Occupational History   Occupation:  disability    Employer: UNEMPLOYED    Comment: since 2008, knee surgery  Tobacco Use   Smoking status: Former  Current packs/day: 0.00    Average packs/day: 1 pack/day for 27.0 years (27.0 ttl pk-yrs)    Types: Cigarettes    Start date: 12/27/1958    Quit date: 12/26/1985    Years since quitting: 38.3   Smokeless tobacco: Never   Tobacco comments:    Quit in 1987  Vaping Use   Vaping status: Never Used  Substance and Sexual Activity   Alcohol use: Not Currently    Comment: occasional wine   Drug use: No   Sexual activity: Yes    Partners: Male    Birth control/protection: Surgical    Comment: married  Other Topics Concern   Not on file  Social History Narrative   Currently married for 10 years. Husband's name is Meribeth. She is an excellent singer. 8 children between she and her husband. 23 grand children. 12 great grandchildren.   Social Drivers of Corporate investment banker Strain: Not on file  Food Insecurity: No Food Insecurity (12/20/2022)   Hunger Vital Sign    Worried About Running Out of Food in the Last Year: Never true    Ran Out of Food in the Last Year: Never true  Transportation Needs: No Transportation Needs (12/20/2022)   PRAPARE - Administrator, Civil Service (Medical): No    Lack of Transportation (Non-Medical): No  Physical Activity: Not on file  Stress: Not on file  Social Connections: Not on file    Review of Systems  Gastrointestinal:  Positive for abdominal pain (RLQ; worse at night).  Musculoskeletal:  Positive for back pain (bilateral).  Denies any N/V, chest pain, shortness of breath, fevers/chills. All other ROS negative.  Vital Signs: BP (!) 116/91   Pulse (!) 54   Temp (!) 97 F (36.1 C) (Tympanic)   Resp 15   Ht 5' 6 (1.676 m)   Wt 208 lb (94.3 kg)   SpO2 98%   BMI 33.57 kg/m    Physical Exam Constitutional:      Appearance: She is obese.  HENT:     Head: Normocephalic and atraumatic.     Mouth/Throat:     Mouth:  Mucous membranes are moist.     Pharynx: Oropharynx is clear.  Cardiovascular:     Rate and Rhythm: Normal rate and regular rhythm.     Heart sounds: Normal heart sounds.  Pulmonary:     Effort: Pulmonary effort is normal.     Breath sounds: Normal breath sounds.  Abdominal:     General: Abdomen is flat.     Palpations: Abdomen is soft.     Tenderness: There is abdominal tenderness (RLQ, mild induration; skin is non-eryethamtous).  Skin:    General: Skin is warm and dry.  Neurological:     Mental Status: She is alert and oriented to person, place, and time.  Psychiatric:        Mood and Affect: Mood normal.     Imaging: No results found.  Labs:  CBC: Recent Labs    10/25/23 1401 02/24/24 2005 03/04/24 2200 04/28/24 0926  WBC 8.5 9.6 11.5* 7.6  HGB 14.0 13.6 13.9 14.6  HCT 43.0 40.7 42.4 44.1  PLT 241 236 210 235    COAGS: Recent Labs    04/28/24 0926  INR 1.0    BMP: Recent Labs    10/25/23 1401 02/24/24 2005 03/04/24 2200 03/08/24 1526  NA 142 141 140 145*  K 3.8 2.9* 3.7 4.7  CL 99 102 99 102  CO2 29 29 29 28   GLUCOSE 108* 120* 96 84  BUN 15 19 20 20   CALCIUM  8.7* 7.7* 8.3* 8.7  CREATININE 1.17* 1.23* 1.11* 1.39*  GFRNONAA 48* 46* 52*  --     LIVER FUNCTION TESTS: Recent Labs    10/25/23 1401 02/24/24 2005  BILITOT 0.7 0.4  AST 47* 38  ALT 28 25  ALKPHOS 71 71  PROT 6.3* 5.4*  ALBUMIN  2.4* 2.1*    TUMOR MARKERS: No results for input(s): AFPTM, CEA, CA199, CHROMGRNA in the last 8760 hours.  Assessment and Plan:  CKD Stage IIIa with proteinuria Sara [name] is a 78 y.o. female with a history of right sided breast cancer s/p mastectomy and renal cel carcinoma s/p partial right nephrectomy who presents to [facility] Interventional Radiology department for an image-guided renal biopsy with Dr. DELENA Balder. Procedure to be performed under moderate sedation.  -NPO since midnight -INR 1.0; VSS -Plan for renal biopsy in [place]  with Dr. DELENA Balder  Risks and benefits of renal biopsy was discussed with the patient and/or patient's family including, but not limited to bleeding, infection, damage to adjacent structures or low yield requiring additional tests.  All of the questions were answered and there is agreement to proceed.  Consent signed and in chart.   Thank you for this interesting consult. I greatly enjoyed meeting Sara [name] and look forward to participating in their care. A copy of this report was sent to the requesting provider on this date.  Electronically Signed: Glennon CHRISTELLA Bal, PA-C 04/28/2024, 10:09 AM   I spent a total of  30 Minutes in face to face clinical consultation, greater than 50% of which was counseling/coordinating care for kidney biopsy.

## 2024-04-27 NOTE — Progress Notes (Signed)
 Patient for US  guided Renal Biopsy on Fri 04/28/24, I called and spoke with the patient on the phone and gave pre-procedure instructions. Pt was made aware to be here at 9a, last dose of ASA 81mg  was Sun 04/16/24, NPO after MN prior to procedure as well as driver post procedure/recovery/discharge. Pt stated understanding.  Called 04/27/24

## 2024-04-28 ENCOUNTER — Ambulatory Visit
Admission: RE | Admit: 2024-04-28 | Discharge: 2024-04-28 | Disposition: A | Discharge status: Home / Self Care | Source: Ambulatory Visit | Attending: Nephrology | Admitting: Nephrology

## 2024-04-28 ENCOUNTER — Other Ambulatory Visit: Payer: Self-pay

## 2024-04-28 DIAGNOSIS — K769 Liver disease, unspecified: Secondary | ICD-10-CM | POA: Diagnosis not present

## 2024-04-28 DIAGNOSIS — E785 Hyperlipidemia, unspecified: Secondary | ICD-10-CM | POA: Diagnosis not present

## 2024-04-28 DIAGNOSIS — I129 Hypertensive chronic kidney disease with stage 1 through stage 4 chronic kidney disease, or unspecified chronic kidney disease: Secondary | ICD-10-CM | POA: Diagnosis not present

## 2024-04-28 DIAGNOSIS — E876 Hypokalemia: Secondary | ICD-10-CM | POA: Insufficient documentation

## 2024-04-28 DIAGNOSIS — I10 Essential (primary) hypertension: Secondary | ICD-10-CM

## 2024-04-28 DIAGNOSIS — K760 Fatty (change of) liver, not elsewhere classified: Secondary | ICD-10-CM | POA: Insufficient documentation

## 2024-04-28 DIAGNOSIS — N1831 Chronic kidney disease, stage 3a: Secondary | ICD-10-CM | POA: Insufficient documentation

## 2024-04-28 DIAGNOSIS — Z7982 Long term (current) use of aspirin: Secondary | ICD-10-CM | POA: Diagnosis not present

## 2024-04-28 DIAGNOSIS — R809 Proteinuria, unspecified: Secondary | ICD-10-CM | POA: Insufficient documentation

## 2024-04-28 LAB — CBC
HCT: 44.1 % (ref 36.0–46.0)
Hemoglobin: 14.6 g/dL (ref 12.0–15.0)
MCH: 31.5 pg (ref 26.0–34.0)
MCHC: 33.1 g/dL (ref 30.0–36.0)
MCV: 95 fL (ref 80.0–100.0)
Platelets: 235 K/uL (ref 150–400)
RBC: 4.64 MIL/uL (ref 3.87–5.11)
RDW: 13.3 % (ref 11.5–15.5)
WBC: 7.6 K/uL (ref 4.0–10.5)
nRBC: 0 % (ref 0.0–0.2)

## 2024-04-28 LAB — PROTIME-INR
INR: 1 (ref 0.8–1.2)
Prothrombin Time: 13.7 s (ref 11.4–15.2)

## 2024-04-28 MED ORDER — METOPROLOL SUCCINATE ER 25 MG PO TB24: ORAL_TABLET | ORAL | 3 refills | Status: AC

## 2024-04-28 MED ORDER — SODIUM CHLORIDE 0.9 % IV SOLN: INTRAVENOUS | Status: DC

## 2024-04-28 MED ORDER — FENTANYL CITRATE (PF) 100 MCG/2ML IJ SOLN
INTRAMUSCULAR | Status: AC | PRN
  Administered 2024-04-28: 50 ug via INTRAVENOUS
  Administered 2024-04-28 (×2): 25 ug via INTRAVENOUS

## 2024-04-28 MED ORDER — ACETAMINOPHEN 500 MG PO TABS: 500.0000 mg | ORAL_TABLET | Freq: Four times a day (QID) | ORAL | Status: DC | PRN

## 2024-04-28 MED ORDER — MIDAZOLAM HCL 2 MG/2ML IJ SOLN
INTRAMUSCULAR | Status: AC | PRN
  Administered 2024-04-28: 1 mg via INTRAVENOUS
  Administered 2024-04-28 (×2): .5 mg via INTRAVENOUS

## 2024-04-28 MED ORDER — FENTANYL CITRATE (PF) 100 MCG/2ML IJ SOLN
INTRAMUSCULAR | Status: AC
  Filled 2024-04-28: qty 4

## 2024-04-28 MED ORDER — MIDAZOLAM HCL 2 MG/2ML IJ SOLN
INTRAMUSCULAR | Status: AC
  Filled 2024-04-28: qty 4

## 2024-04-28 NOTE — Procedures (Signed)
 Interventional Radiology Procedure:   Indications: Chronic Kidney Disease IIIA with proteinuria.   Procedure: US  guided left renal biopsy  Findings: 2 core biopsies from left kidney lower pole.  Gelfoam slurry injected along biopsy tract.  Complications: None     EBL: Minimal  Plan: Bedrest 3 hours, plan to discharge home later today.   Moss Berry R. Philip, MD  Pager: [phone]

## 2024-05-08 DIAGNOSIS — R809 Proteinuria, unspecified: Secondary | ICD-10-CM | POA: Diagnosis not present

## 2024-05-08 DIAGNOSIS — I10 Essential (primary) hypertension: Secondary | ICD-10-CM | POA: Diagnosis not present

## 2024-05-08 DIAGNOSIS — E785 Hyperlipidemia, unspecified: Secondary | ICD-10-CM | POA: Diagnosis not present

## 2024-05-08 DIAGNOSIS — E876 Hypokalemia: Secondary | ICD-10-CM | POA: Diagnosis not present

## 2024-05-08 DIAGNOSIS — N1831 Chronic kidney disease, stage 3a: Secondary | ICD-10-CM | POA: Diagnosis not present

## 2024-05-30 NOTE — Progress Notes (Signed)
 Hematology-Oncology Clinic Note  Sara Lung, MD   Reason for Referral: Renal amyloidosis  Oncology History: I have reviewed her chart and materials related to her cancer extensively and collaborated history with the patient. Summary of oncologic history is as follows:  Diagnosis: Renal amyloidosis  -04/24/2024: Tier 1, 2, and 3 interpretation: All three negative tiers indicate the absence of detectable antibodies to component analytes consisting of double stranded DNA (dsDNA), chromatin, ribonucleoprotein (RNP), Smith/RNP (Sm/RNP), Smith (Sm), SS-A, SS-B, Jo-1, Scl-70, centromere B and ribosomal P.  -04/24/2024: Antinuclear antibodies tier and pattern: ANA titer 1:80. ANA Pattern 1 Nuclear, Nucleolar.  -04/24/2024: Renal function panel: Creatinine 1.15. Calcium  8.5. Albumin  2.6.  -04/24/2024: Hepatitis B and Hepatitis C negative. HIV negative.  -04/28/2024: Kidney biopsy.   Pathology: Lambda light chain AL renal amyloidosis. Moderate intersitial fibrosis and tubular atrophy (IFTA 30-40%) with 30% obsolescent glomeruli (6/20 glomeruli).    History of Presenting Illness: Sara [name] 78 y.o. female is referred by Douglas Rule, MD for renal amyloidosis  Patient has a medical history of renal cell carcinoma s/p right partial nephrectomy, right breast DCIS s/p mastectomy, CKD, hypertension, hyperlipidemia, and hypothyroidism.    Medical History: Past Medical History:  Diagnosis Date   Allergic rhinitis due to pollen    Anxiety    Arthritis    knees, back, right elbow; left shoulder; right ankle (11/20/2014)   Breast cancer, right breast (HCC) 2016   DCIS; zero stage S/P mastectomy, cancer free since then   Cervical spondylosis without myelopathy    Cervicalgia    Chronic lower back pain    Constipation    takes Miralax  daily   Degeneration of cervical intervertebral disc    Depression    Diverticulosis of colon (without mention of hemorrhage)    GERD  (gastroesophageal reflux disease)    takes Omeprazole daily   Graves' disease    took a pill to correct   H/O hiatal hernia    H/O urinary frequency    Headache(784.0)    denies migraines since the 80's but has occ and takes Butalbital prn   HTN (hypertension)    takes Metoprolol  and Lisinopril  daily   Insomnia    takes Ativan  nigtly   Joint pain    all of them   Joint swelling    knees, legs, ankles sometimes (11/20/2014)   Morbid obesity (HCC)    Numbness    LOWER LEGS   Other postablative hypothyroidism    takes SYnthroid  daily   Palpitations    Peptic ulcer, unspecified site, unspecified as acute or chronic, without mention of hemorrhage, perforation, or obstruction    Primary localized osteoarthrosis, lower leg    Pure hypercholesterolemia    takes Pravastin daily   Recurrent UTI    Renal cell carcinoma (HCC) 01/08/2016   Right renal mass s/p partial right nephrectomy, cancer free since 2017   Shortness of breath    Sleep apnea    slight but doesn't require a CPAP (11/20/2014)   Tendonitis of knee    bilateral    Surgical history: Past Surgical History:  Procedure Laterality Date   ABDOMINAL HYSTERECTOMY     ANTERIOR LAT LUMBAR FUSION  05/13/2012   Procedure: ANTERIOR LATERAL LUMBAR FUSION 2 LEVELS;  Surgeon: Darina MALVA Boehringer, MD;  Location: [facility];  Service: Neurosurgery;  Laterality: Left;  Left Lumbar Three-four,Lumbar four-five Extreme Lumbar Interbody Fusion with Percutaneous Pedicle Screws   APPENDECTOMY     BREAST BIOPSY Left ~ 2014  BREAST BIOPSY Right 2015   BREAST LUMPECTOMY Left 1988   BREAST RECONSTRUCTION WITH PLACEMENT OF TISSUE EXPANDER AND FLEX HD (ACELLULAR HYDRATED DERMIS) Right 11/20/2014   BREAST RECONSTRUCTION WITH PLACEMENT OF TISSUE EXPANDER AND FLEX HD (ACELLULAR HYDRATED DERMIS) Right 11/20/2014   Procedure: RIGHT BREAST RECONSTRUCTION WITH PLACEMENT OF TISSUE EXPANDER AND ACELLULAR DERMA MATRIX (ACELLULAR DERMA MATRIX);  Surgeon: Alm Sick, MD;  Location: [facility] OR;  Service: Plastics;  Laterality: Right;   CATARACT EXTRACTION W/ INTRAOCULAR LENS  IMPLANT, BILATERAL Bilateral    COLONOSCOPY  Sept 2008   RMR: normal rectum, left-sided diverticula, repeat in 2018   COLONOSCOPY WITH PROPOFOL  N/A 03/21/2018   Dr. Shaaron: diverticulosis, internal grade I hemorrhoids. no future colonoscopies unless develops new symptoms   DIAGNOSTIC LAPAROSCOPY     DILATION AND CURETTAGE OF UTERUS     ESOPHAGOGASTRODUODENOSCOPY     ESOPHAGOGASTRODUODENOSCOPY  2013   Dr. Shaaron: Cervical esophageal web and Schatzki's ring s/p dilation, small hiatal hernia, antral and duodenal erosions likely NSAID effect, chronic duodenitis on path    ESOPHAGOGASTRODUODENOSCOPY (EGD) WITH PROPOFOL  N/A 03/21/2018   Dr. Shaaron: mild Schatzki ring s/p dilation   FOREIGN BODY REMOVAL Right 10/06/2013   Procedure: REMOVAL FOREIGN BODY EXTREMITY;  Surgeon: Taft FORBES Minerva, MD;  Location: [facility];  Service: Orthopedics;  Laterality: Right;   INJECTION KNEE Left 10/06/2013   Procedure: KNEE INJECTION;  Surgeon: Taft FORBES Minerva, MD;  Location: [facility];  Service: Orthopedics;  Laterality: Left;   JOINT REPLACEMENT Bilateral    knees   KNEE ARTHROSCOPY Right    KNEE ARTHROSCOPY WITH LATERAL MENISECTOMY Right 10/06/2013   Procedure: KNEE ARTHROSCOPY WITH LATERAL AND MEDIAL MENISECTOMY;  Surgeon: Taft FORBES Minerva, MD;  Location: [facility];  Service: Orthopedics;  Laterality: Right;  END @ 1234   lumpectomy on pelvis     mass of nerves   MALONEY DILATION N/A 03/21/2018   Procedure: MALONEY DILATION;  Surgeon: Shaaron Lamar HERO, MD;  Location: [facility];  Service: Endoscopy;  Laterality: N/A;   MASTECTOMY COMPLETE / SIMPLE W/ SENTINEL NODE BIOPSY Right 11/20/2014   axillary   REMOVAL OF TISSUE EXPANDER AND PLACEMENT OF IMPLANT Right 12/13/2014   Procedure: REMOVAL OF RIGHT BREAST TISSUE EXPANDER;  Surgeon: Alm Sick, MD;  Location: [facility] OR;  Service: Plastics;  Laterality: Right;    ROBOTIC ASSITED PARTIAL NEPHRECTOMY Right 01/08/2016   Procedure: XI ROBOTIC ASSITED PARTIAL NEPHRECTOMY;  Surgeon: Ricardo Likens, MD;  Location: [facility];  Service: Urology;  Laterality: Right;   SIMPLE MASTECTOMY WITH AXILLARY SENTINEL NODE BIOPSY Right 11/20/2014   Procedure: SIMPLE MASTECTOMY WITH AXILLARY SENTINEL NODE BIOPSY;  Surgeon: Debby Shipper, MD;  Location: [facility] OR;  Service: General;  Laterality: Right;   TISSUE EXPANDER REMOVAL Right 12/13/2014   dr sick   TONSILLECTOMY     TOTAL KNEE ARTHROPLASTY Right 01/24/2014   Procedure: TOTAL KNEE ARTHROPLASTY;  Surgeon: Taft FORBES Minerva, MD;  Location: [facility];  Service: Orthopedics;  Laterality: Right;   TOTAL KNEE ARTHROPLASTY Left 08/08/2014   Procedure: TOTAL KNEE ARTHROPLASTY;  Surgeon: Taft FORBES Minerva, MD;  Location: [facility];  Service: Orthopedics;  Laterality: Left;   TUBAL LIGATION     UPPER GASTROINTESTINAL ENDOSCOPY       Allergies:  is allergic to propoxyphene hcl, pollen extract, camphor, dust mite extract, latex, molds & smuts, and tomato.  Medications:  Current Outpatient Medications  Medication Sig Dispense Refill   acetaminophen  (TYLENOL ) 500 MG tablet Take 650 mg by  mouth as needed for moderate pain or headache.     amLODipine  (NORVASC ) 2.5 MG tablet Take 1 tablet (2.5 mg total) by mouth daily. (Patient not taking: Reported on 04/28/2024) 30 tablet 11   aspirin  EC 81 MG tablet Take 81 mg by mouth daily. Swallow whole.     atorvastatin  (LIPITOR) 20 MG tablet Take 1 tablet (20 mg total) by mouth daily. 30 tablet 11   dapagliflozin propanediol (FARXIGA) 5 MG TABS tablet Take 5 mg by mouth every morning.     gabapentin  (NEURONTIN ) 600 MG tablet Take 1 tablet (600 mg total) by mouth 2 (two) times daily. 180 tablet 2   levothyroxine  (SYNTHROID ) 150 MCG tablet Take 1 tablet (150 mcg total) by mouth daily.     Magnesium  400 MG CAPS Take 400 mg by mouth daily. (Patient not taking: Reported on 04/28/2024)     metoprolol   succinate (TOPROL  XL) 25 MG 24 hr tablet Take 1 1/2 tablet in the morning (37.5 mg) and take 1/2 (12.5mg ) tablet at night. 180 tablet 3   Multiple Vitamins-Minerals (CENTRUM SILVER PO) Take 1 tablet by mouth daily.     omeprazole (PRILOSEC) 20 MG capsule Take 20 mg by mouth daily. May take a second 20 mg dose as needed for heartburn     ondansetron  (ZOFRAN ) 4 MG tablet Take 1 tablet (4 mg total) by mouth every 8 (eight) hours as needed for nausea or vomiting. (Patient not taking: Reported on 02/29/2024) 12 tablet 0   oxyCODONE  (ROXICODONE ) 5 MG immediate release tablet Take 1 tablet (5 mg total) by mouth every 6 (six) hours as needed for severe pain (pain score 7-10). (Patient not taking: Reported on 02/29/2024) 10 tablet 0   potassium chloride  (KLOR-CON ) 20 MEQ packet Take 20 mEq by mouth 2 (two) times daily. 180 packet 3   torsemide  (DEMADEX ) 20 MG tablet Take 3 tablets (60 mg total) by mouth daily. 270 tablet 3   Vibegron (GEMTESA) 75 MG TABS Take 75 mg by mouth daily.     Vitamin D , Ergocalciferol , (DRISDOL ) 1.25 MG (50000 UNIT) CAPS capsule Take 1 capsule (50,000 Units total) by mouth every 14 (fourteen) days. 6 capsule 3   No current facility-administered medications for this visit.    Review of Systems: Constitutional: Denies fevers, chills or abnormal night sweats Eyes: Denies blurriness of vision, double vision or watery eyes Ears, nose, mouth, throat, and face: Denies mucositis or sore throat Respiratory: Denies cough, dyspnea or wheezes Cardiovascular: Denies palpitation, chest discomfort or lower extremity swelling Gastrointestinal:  Denies nausea, heartburn or change in bowel habits Skin: Denies abnormal skin rashes Lymphatics: Denies new lymphadenopathy or easy bruising Neurological:Denies numbness, tingling or new weaknesses Behavioral/Psych: Mood is stable, no new changes  All other systems were reviewed with the patient and are negative.  Physical Examination: ECOG  PERFORMANCE STATUS: {CHL ONC ECOG PS:[phone]}  There were no vitals filed for this visit. There were no vitals filed for this visit.  GENERAL:alert, no distress and comfortable SKIN: skin color, texture, turgor are normal, no rashes or significant lesions EYES: normal, conjunctiva are pink and non-injected, sclera clear OROPHARYNX:no exudate, no erythema and lips, buccal mucosa, and tongue normal  NECK: supple, thyroid  normal size, non-tender, without nodularity LYMPH:  no palpable lymphadenopathy in the cervical, axillary or inguinal LUNGS: clear to auscultation and percussion with normal breathing effort HEART: regular rate & rhythm and no murmurs and no lower extremity edema ABDOMEN:abdomen soft, non-tender and normal bowel sounds Musculoskeletal:no cyanosis of digits  and no clubbing  PSYCH: alert & oriented x 3 with fluent speech NEURO: no focal motor/sensory deficits   Laboratory Data: I have reviewed the data as listed Lab Results  Component Value Date   WBC 7.6 04/28/2024   HGB 14.6 04/28/2024   HCT 44.1 04/28/2024   MCV 95.0 04/28/2024   PLT 235 04/28/2024   Recent Labs    10/25/23 1401 02/24/24 2005 03/04/24 2200 03/08/24 1526  NA 142 141 140 145*  K 3.8 2.9* 3.7 4.7  CL 99 102 99 102  CO2 29 29 29 28   GLUCOSE 108* 120* 96 84  BUN 15 19 20 20   CREATININE 1.17* 1.23* 1.11* 1.39*  CALCIUM  8.7* 7.7* 8.3* 8.7  GFRNONAA 48* 46* 52*  --   PROT 6.3* 5.4*  --   --   ALBUMIN  2.4* 2.1*  --   --   AST 47* 38  --   --   ALT 28 25  --   --   ALKPHOS 71 71  --   --   BILITOT 0.7 0.4  --   --     Radiographic Studies: I have personally reviewed the radiological images as listed and agreed with the findings in the report.  US  BIOPSY (KIDNEY) INDICATION: Chronic kidney disease stage 3a. Nephrotic range proteinuria.  EXAM: ULTRASOUND-GUIDED RANDOM RENAL BIOPSY  MEDICATIONS: Moderate sedation  ANESTHESIA/SEDATION: Moderate (conscious) sedation was employed  during this procedure. A total of Versed  2 mg and Fentanyl  100 mcg was administered intravenously by the radiology nurse.  Total intra-service moderate Sedation Time: 37 minutes. The patient's level of consciousness and vital signs were monitored continuously by radiology nursing throughout the procedure under my direct supervision.  FLUOROSCOPY TIME:  None  COMPLICATIONS: None immediate.  PROCEDURE: Informed written consent was obtained from the patient after a thorough discussion of the procedural risks, benefits and alternatives. All questions were addressed. A timeout was performed prior to the initiation of the procedure.  Patient was placed prone. Both kidneys were evaluated with ultrasound. Left kidney was selected for biopsy. Left flank was prepped and draped in sterile fashion. Maximal barrier sterile technique was utilized including caps, mask, sterile gowns, sterile gloves, sterile drape, hand hygiene and skin antiseptic. Skin was anesthetized using 1% lidocaine . Small incision was made. Using ultrasound guidance, a 17 gauge coaxial needle was directed to the lower pole cortex. Two core biopsies were obtained from the lower pole cortex with an 18 gauge core device. Specimens placed in saline. Gel-Foam slurry was injected as the 17 gauge coaxial needle was retracted. Bandage placed over the puncture site.  FINDINGS: Both kidneys were evaluated with ultrasound. Left kidney was easier to visualize. Right kidney was difficult to visualize and right kidney lower pole was irregular and compatible with previous partial nephrectomy. As a result, the left kidney was targeted for biopsy.  2 adequate core biopsy specimens were obtained. No immediate bleeding or hematoma formation.  IMPRESSION: Successful ultrasound-guided left renal biopsy.  Electronically Signed   By: Juliene Balder M.D.   On: 04/28/2024 12:47    ASSESSMENT & PLAN:  Patient is a 78 y.o. female  presenting for ***  Assessment & Plan     No orders of the defined types were placed in this encounter.   The total time spent in the appointment was {CHL ONC TIME VISIT - DTPQU:[id number]} encounter with patients including review of chart and various tests results, discussions about plan of care and coordination of care plan  All questions were answered. The patient knows to call the clinic with any problems, questions or concerns. No barriers to learning was detected.  [address] [place] 9/9/20251:43 PM

## 2024-05-31 ENCOUNTER — Encounter: Payer: Self-pay | Admitting: Oncology

## 2024-05-31 ENCOUNTER — Inpatient Hospital Stay: Attending: Oncology | Admitting: Oncology

## 2024-05-31 ENCOUNTER — Inpatient Hospital Stay

## 2024-05-31 VITALS — BP 110/73 | HR 55 | Temp 97.6°F | Resp 18 | Ht 66.0 in | Wt 216.0 lb

## 2024-05-31 DIAGNOSIS — E8581 Light chain (AL) amyloidosis: Secondary | ICD-10-CM | POA: Insufficient documentation

## 2024-05-31 DIAGNOSIS — Z85528 Personal history of other malignant neoplasm of kidney: Secondary | ICD-10-CM | POA: Diagnosis not present

## 2024-05-31 DIAGNOSIS — Z905 Acquired absence of kidney: Secondary | ICD-10-CM | POA: Insufficient documentation

## 2024-05-31 DIAGNOSIS — Z9011 Acquired absence of right breast and nipple: Secondary | ICD-10-CM | POA: Insufficient documentation

## 2024-05-31 DIAGNOSIS — D0510 Intraductal carcinoma in situ of unspecified breast: Secondary | ICD-10-CM | POA: Insufficient documentation

## 2024-05-31 DIAGNOSIS — Z87891 Personal history of nicotine dependence: Secondary | ICD-10-CM | POA: Insufficient documentation

## 2024-05-31 DIAGNOSIS — Z86000 Personal history of in-situ neoplasm of breast: Secondary | ICD-10-CM | POA: Diagnosis not present

## 2024-05-31 DIAGNOSIS — D0511 Intraductal carcinoma in situ of right breast: Secondary | ICD-10-CM

## 2024-05-31 DIAGNOSIS — C649 Malignant neoplasm of unspecified kidney, except renal pelvis: Secondary | ICD-10-CM

## 2024-05-31 LAB — COMPREHENSIVE METABOLIC PANEL WITH GFR
ALT: 30 U/L (ref 0–44)
AST: 42 U/L — ABNORMAL HIGH (ref 15–41)
Albumin: 2.4 g/dL — ABNORMAL LOW (ref 3.5–5.0)
Alkaline Phosphatase: 91 U/L (ref 38–126)
Anion gap: 14 (ref 5–15)
BUN: 18 mg/dL (ref 8–23)
CO2: 30 mmol/L (ref 22–32)
Calcium: 8.3 mg/dL — ABNORMAL LOW (ref 8.9–10.3)
Chloride: 97 mmol/L — ABNORMAL LOW (ref 98–111)
Creatinine, Ser: 1.2 mg/dL — ABNORMAL HIGH (ref 0.44–1.00)
GFR, Estimated: 47 mL/min — ABNORMAL LOW (ref >60)
Glucose, Bld: 78 mg/dL (ref 70–99)
Potassium: 3.7 mmol/L (ref 3.5–5.1)
Sodium: 141 mmol/L (ref 135–145)
Total Bilirubin: 0.5 mg/dL (ref 0.0–1.2)
Total Protein: 6.2 g/dL — ABNORMAL LOW (ref 6.5–8.1)

## 2024-05-31 LAB — CBC WITH DIFFERENTIAL/PLATELET
Abs Immature Granulocytes: 0.01 K/uL (ref 0.00–0.07)
Basophils Absolute: 0.1 K/uL (ref 0.0–0.1)
Basophils Relative: 1 %
Eosinophils Absolute: 0.2 K/uL (ref 0.0–0.5)
Eosinophils Relative: 2 %
HCT: 43.2 % (ref 36.0–46.0)
Hemoglobin: 14.2 g/dL (ref 12.0–15.0)
Immature Granulocytes: 0 %
Lymphocytes Relative: 48 %
Lymphs Abs: 3.4 K/uL (ref 0.7–4.0)
MCH: 32.1 pg (ref 26.0–34.0)
MCHC: 32.9 g/dL (ref 30.0–36.0)
MCV: 97.7 fL (ref 80.0–100.0)
Monocytes Absolute: 0.5 K/uL (ref 0.1–1.0)
Monocytes Relative: 7 %
Neutro Abs: 2.9 K/uL (ref 1.7–7.7)
Neutrophils Relative %: 42 %
Platelets: 221 K/uL (ref 150–400)
RBC: 4.42 MIL/uL (ref 3.87–5.11)
RDW: 13.6 % (ref 11.5–15.5)
WBC: 7 K/uL (ref 4.0–10.5)
nRBC: 0 % (ref 0.0–0.2)

## 2024-05-31 LAB — LACTATE DEHYDROGENASE: LDH: 222 U/L — ABNORMAL HIGH (ref 98–192)

## 2024-05-31 NOTE — Assessment & Plan Note (Addendum)
 Diagnosis confirmed by kidney biopsy showing AL amyloid deposits.  -Discussed the diagnosis, prognosis and probable treatment options in detail. - Will need a bone marrow biopsy with aspiration to rule out multiple myeloma.  Will send for karyotype, multiple myeloma FISH and flow cytometry. - Will obtain ECHO to rule out cardiac involvement - Will obtain NT-pro BNP, SPEP with IFE, Beta 2 microglobulin, 24 hr urine protein - UPEP and IFE, Free light chains, LDH, uric acid, CBC, CMP, PT, PTT, INR, lipid panel and peripheral flow cytometry -Will obtain PET CT to rule out lytic bone lesions - Discussed with the patient that if this is AL amyloidosis, she would require chemotherapy followed by probable bone marrow transplant if patient is a candidate and if not then maintenance treatment.  Return to clinic in 1 week after biopsy to discuss results and further management

## 2024-05-31 NOTE — Patient Instructions (Signed)
 [facility]  Discharge Instructions  You were seen and examined today by Dr. Davonna. Dr. Davonna is a medical oncologist, meaning that she specializes in the treatment of cancer diagnoses. Dr. Davonna discussed your past medical history, family history of cancers, and the events that led to you being here today.  You were referred to Dr. Davonna again for a new diagnosis of Renal Amyloidosis, which is an abnormal protein deposition typically caused by a cancer known as Multiple Myeloma. The most common treatment is chemotherapy followed by bone marrow transplant.  Dr. Davonna has recommended a bone marrow biopsy to confirm or rule out multiple myeloma. Dr. Davonna has also recommended an echocardiogram (ultrasound of the heart) to ensure there is no protein deposits in your heart. Dr. Davonna has also ordered additional labs today which will include a 24 hour urine.  Follow-up as scheduled.  Thank you for choosing [facility] to provide your oncology and hematology care.   To afford each patient quality time with our provider, please arrive at least 15 minutes before your scheduled appointment time. You may need to reschedule your appointment if you arrive late (10 or more minutes). Arriving late affects you and other patients whose appointments are after yours.  Also, if you miss three or more appointments without notifying the office, you may be dismissed from the clinic at the provider's discretion.    Again, thank you for choosing [facility].  Our hope is that these requests will decrease the amount of time that you wait before being seen by our physicians.   If you have a lab appointment with the Cancer Center - please note that after April 8th, all labs will be drawn in the cancer center.  You do not have to check in or register with the main entrance as you have in the past but will complete your check-in at the cancer  center.            _____________________________________________________________  Should you have questions after your visit to [facility], please contact our office at [phone] and follow the prompts.  Our office hours are 8:00 a.m. to 4:30 p.m. Monday - Thursday and 8:00 a.m. to 2:30 p.m. Friday.  Please note that voicemails left after 4:00 p.m. may not be returned until the following business day.  We are closed weekends and all major holidays.  You do have access to a nurse 24-7, just call the main number to the clinic [phone] and do not press any options, hold on the line and a nurse will answer the phone.    For prescription refill requests, have your pharmacy contact our office and allow 72 hours.    Masks are no longer required in the cancer centers. If you would like for your care team to wear a mask while they are taking care of you, please let them know. You may have one support person who is at least 77 years old accompany you for your appointments.

## 2024-05-31 NOTE — Assessment & Plan Note (Addendum)
 She had a T1a Nx RCC which was treated with partial right nephrectomy (01/08/2016). Last CT scan on 10/01/2017 showed no recurrence.  Hepatic steatosis was seen. She had a repeat CT scan in January 2021 which was reportedly normal.   Completed 5 years of surveillance with Dr.Manny

## 2024-05-31 NOTE — Assessment & Plan Note (Addendum)
 Diagnosed in December 2015 due to abnormal screening mammogram S/p right simple mastectomy on 11/20/2014, lymph node biopsy negative.  Grade 2, 10 cm, ER: Positive, 100%, PR: positive, 90%. Completed 5 years of tamoxifen .  She stopped the tamoxifen  on 01/19/2020.  - Continue yearly mammograms

## 2024-06-01 LAB — SURGICAL PATHOLOGY

## 2024-06-02 ENCOUNTER — Other Ambulatory Visit (HOSPITAL_COMMUNITY): Payer: Self-pay

## 2024-06-02 ENCOUNTER — Other Ambulatory Visit: Payer: Self-pay

## 2024-06-02 DIAGNOSIS — Z86000 Personal history of in-situ neoplasm of breast: Secondary | ICD-10-CM | POA: Diagnosis not present

## 2024-06-02 DIAGNOSIS — E8581 Light chain (AL) amyloidosis: Secondary | ICD-10-CM | POA: Diagnosis not present

## 2024-06-02 DIAGNOSIS — Z9011 Acquired absence of right breast and nipple: Secondary | ICD-10-CM | POA: Diagnosis not present

## 2024-06-02 DIAGNOSIS — Z85528 Personal history of other malignant neoplasm of kidney: Secondary | ICD-10-CM | POA: Diagnosis not present

## 2024-06-02 DIAGNOSIS — Z87891 Personal history of nicotine dependence: Secondary | ICD-10-CM | POA: Diagnosis not present

## 2024-06-02 DIAGNOSIS — Z905 Acquired absence of kidney: Secondary | ICD-10-CM | POA: Diagnosis not present

## 2024-06-02 LAB — PROTEIN ELECTROPHORESIS, SERUM
A/G Ratio: 0.6 — ABNORMAL LOW (ref 0.7–1.7)
Albumin ELP: 2.1 g/dL — ABNORMAL LOW (ref 2.9–4.4)
Alpha-1-Globulin: 0.3 g/dL (ref 0.0–0.4)
Alpha-2-Globulin: 1 g/dL (ref 0.4–1.0)
Beta Globulin: 1.6 g/dL — ABNORMAL HIGH (ref 0.7–1.3)
Gamma Globulin: 0.4 g/dL (ref 0.4–1.8)
Globulin, Total: 3.3 g/dL (ref 2.2–3.9)
M-Spike, %: 0.7 g/dL — ABNORMAL HIGH
Total Protein ELP: 5.4 g/dL — ABNORMAL LOW (ref 6.0–8.5)

## 2024-06-02 LAB — FLOW CYTOMETRY

## 2024-06-02 LAB — BETA 2 MICROGLOBULIN, SERUM: Beta-2 Microglobulin: 3.8 mg/L — ABNORMAL HIGH (ref 0.6–2.4)

## 2024-06-02 LAB — KAPPA/LAMBDA LIGHT CHAINS
Kappa free light chain: 34 mg/L — ABNORMAL HIGH (ref 3.3–19.4)
Kappa, lambda light chain ratio: 0.39 (ref 0.26–1.65)
Lambda free light chains: 86.9 mg/L — ABNORMAL HIGH (ref 5.7–26.3)

## 2024-06-04 LAB — IMMUNOFIXATION ELECTROPHORESIS
IgA: 932 mg/dL — ABNORMAL HIGH (ref 64–422)
IgG (Immunoglobin G), Serum: 501 mg/dL — ABNORMAL LOW (ref 586–1602)
IgM (Immunoglobulin M), Srm: 53 mg/dL (ref 26–217)
Total Protein ELP: 5.5 g/dL — ABNORMAL LOW (ref 6.0–8.5)

## 2024-06-06 LAB — UIFE/LIGHT CHAINS/TP QN, 24-HR UR
FR KAPPA LT CH,24HR: 161.74 mg/(24.h)
FR LAMBDA LT CH,24HR: 121.36 mg/(24.h)
Free Kappa Lt Chains,Ur: 92.42 mg/L — ABNORMAL HIGH (ref 1.17–86.46)
Free Kappa/Lambda Ratio: 1.33 — ABNORMAL LOW (ref 1.83–14.26)
Free Lambda Lt Chains,Ur: 69.35 mg/L — ABNORMAL HIGH (ref 0.27–15.21)
Total Protein, Urine-Ur/day: 5574 mg/(24.h) — ABNORMAL HIGH (ref 30–150)
Total Protein, Urine: 318.5 mg/dL
Total Volume: 1750

## 2024-06-12 DIAGNOSIS — E854 Organ-limited amyloidosis: Secondary | ICD-10-CM | POA: Diagnosis not present

## 2024-06-12 DIAGNOSIS — I10 Essential (primary) hypertension: Secondary | ICD-10-CM | POA: Diagnosis not present

## 2024-06-12 DIAGNOSIS — E785 Hyperlipidemia, unspecified: Secondary | ICD-10-CM | POA: Diagnosis not present

## 2024-06-12 DIAGNOSIS — E876 Hypokalemia: Secondary | ICD-10-CM | POA: Diagnosis not present

## 2024-06-12 DIAGNOSIS — R809 Proteinuria, unspecified: Secondary | ICD-10-CM | POA: Diagnosis not present

## 2024-06-12 DIAGNOSIS — N1831 Chronic kidney disease, stage 3a: Secondary | ICD-10-CM | POA: Diagnosis not present

## 2024-06-15 ENCOUNTER — Encounter (HOSPITAL_COMMUNITY)
Admission: RE | Admit: 2024-06-15 | Discharge: 2024-06-15 | Disposition: A | Discharge status: Home / Self Care | Source: Ambulatory Visit | Attending: Oncology | Admitting: Oncology

## 2024-06-15 DIAGNOSIS — Z853 Personal history of malignant neoplasm of breast: Secondary | ICD-10-CM | POA: Diagnosis not present

## 2024-06-15 DIAGNOSIS — E8581 Light chain (AL) amyloidosis: Secondary | ICD-10-CM | POA: Diagnosis not present

## 2024-06-15 DIAGNOSIS — Z1289 Encounter for screening for malignant neoplasm of other sites: Secondary | ICD-10-CM | POA: Insufficient documentation

## 2024-06-15 DIAGNOSIS — C50919 Malignant neoplasm of unspecified site of unspecified female breast: Secondary | ICD-10-CM | POA: Diagnosis not present

## 2024-06-15 MED ORDER — FLUDEOXYGLUCOSE F - 18 (FDG) INJECTION
11.3690 | Freq: Once | INTRAVENOUS | Status: AC | PRN
  Administered 2024-06-15: 11.369 via INTRAVENOUS

## 2024-06-23 ENCOUNTER — Other Ambulatory Visit: Payer: Self-pay | Admitting: Orthopedic Surgery

## 2024-06-28 ENCOUNTER — Ambulatory Visit (HOSPITAL_COMMUNITY)
Admission: RE | Admit: 2024-06-28 | Discharge: 2024-06-28 | Disposition: A | Discharge status: Home / Self Care | Source: Ambulatory Visit | Attending: Family Medicine | Admitting: Family Medicine

## 2024-06-28 ENCOUNTER — Other Ambulatory Visit: Payer: Self-pay | Admitting: Radiology

## 2024-06-28 DIAGNOSIS — E8581 Light chain (AL) amyloidosis: Secondary | ICD-10-CM | POA: Insufficient documentation

## 2024-06-28 DIAGNOSIS — Z0189 Encounter for other specified special examinations: Secondary | ICD-10-CM | POA: Diagnosis not present

## 2024-06-28 LAB — ECHOCARDIOGRAM COMPLETE
Area-P 1/2: 2 cm2
S' Lateral: 2.5 cm

## 2024-06-28 NOTE — Progress Notes (Signed)
*  PRELIMINARY RESULTS* Echocardiogram 2D Echocardiogram has been performed.  Sara [name] 06/28/2024, 10:24 AM

## 2024-06-28 NOTE — H&P (Shared)
 Chief Complaint: Renal amyloidosis ; abnormal serum protein electrophoresis ;referred for image guided bone marrow biopsy to rule out myeloma  Referring Provider(s): Davonna Siad   Supervising Physician: Philip Cornet  Patient Status: [facility] - Out-pt  History of Present Illness: Sara [name] is a 78 y.o. female with PMH significant for renal cell carcinoma s/p right partial nephrectomy, right breast DCIS s/p mastectomy and 5 years of tamoxifen , CKD, hypertension, hyperlipidemia, and hypothyroidism. She is known to IR from L renal bx on 04/28/24 with Dr. 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 IR consulted for BMBx as part of ongoing work up and treatment planning for renal amyloidosis.   Allergies Reviewed:  Propoxyphene hcl, Pollen extract, Camphor, Dust mite extract, Latex, Molds & smuts, and Tomato   Patient is Full Code  Past Medical History:  Diagnosis Date   Allergic rhinitis due to pollen    Anxiety    Arthritis    knees, back, right elbow; left shoulder; right ankle (11/20/2014)   Breast cancer, right breast (HCC) 2016   DCIS; zero stage S/P mastectomy, cancer free since then   Cervical spondylosis without myelopathy    Cervicalgia    Chronic lower back pain    Constipation    takes Miralax  daily   Degeneration of cervical intervertebral disc    Depression    Diverticulosis of colon (without mention of hemorrhage)    GERD (gastroesophageal reflux disease)    takes Omeprazole daily   Graves' disease    took a pill to correct   H/O hiatal hernia    H/O urinary frequency    Headache(784.0)    denies migraines since the 80's but has occ and takes Butalbital prn   HTN (hypertension)    takes Metoprolol  and Lisinopril  daily   Insomnia    takes Ativan  nigtly   Joint pain    all of them   Joint swelling    knees, legs, ankles sometimes (11/20/2014)   Morbid obesity (HCC)    Numbness    LOWER LEGS   Other postablative  hypothyroidism    takes SYnthroid  daily   Palpitations    Peptic ulcer, unspecified site, unspecified as acute or chronic, without mention of hemorrhage, perforation, or obstruction    Primary localized osteoarthrosis, lower leg    Pure hypercholesterolemia    takes Pravastin daily   Recurrent UTI    Renal cell carcinoma (HCC) 01/08/2016   Right renal mass s/p partial right nephrectomy, cancer free since 2017   Shortness of breath    Sleep apnea    slight but doesn't require a CPAP (11/20/2014)   Tendonitis of knee    bilateral    Past Surgical History:  Procedure Laterality Date   ABDOMINAL HYSTERECTOMY     ANTERIOR LAT LUMBAR FUSION  05/13/2012   Procedure: ANTERIOR LATERAL LUMBAR FUSION 2 LEVELS;  Surgeon: Darina MALVA Boehringer, MD;  Location: [facility];  Service: Neurosurgery;  Laterality: Left;  Left Lumbar Three-four,Lumbar four-five Extreme Lumbar Interbody Fusion with Percutaneous Pedicle Screws   APPENDECTOMY     BREAST BIOPSY Left ~ 2014   BREAST BIOPSY Right 2015   BREAST LUMPECTOMY Left 1988   BREAST RECONSTRUCTION WITH PLACEMENT OF TISSUE EXPANDER AND FLEX HD (ACELLULAR HYDRATED DERMIS) Right 11/20/2014   BREAST RECONSTRUCTION WITH PLACEMENT OF TISSUE EXPANDER AND FLEX HD (ACELLULAR HYDRATED DERMIS) Right 11/20/2014   Procedure: RIGHT BREAST RECONSTRUCTION WITH PLACEMENT OF TISSUE EXPANDER AND ACELLULAR DERMA MATRIX (ACELLULAR DERMA MATRIX);  Surgeon: Alm Sick, MD;  Location: [facility] OR;  Service: Plastics;  Laterality: Right;   CATARACT EXTRACTION W/ INTRAOCULAR LENS  IMPLANT, BILATERAL Bilateral    COLONOSCOPY  Sept 2008   RMR: normal rectum, left-sided diverticula, repeat in 2018   COLONOSCOPY WITH PROPOFOL  N/A 03/21/2018   Dr. Shaaron: diverticulosis, internal grade I hemorrhoids. no future colonoscopies unless develops new symptoms   DIAGNOSTIC LAPAROSCOPY     DILATION AND CURETTAGE OF UTERUS     ESOPHAGOGASTRODUODENOSCOPY     ESOPHAGOGASTRODUODENOSCOPY  2013   Dr. Shaaron:  Cervical esophageal web and Schatzki's ring s/p dilation, small hiatal hernia, antral and duodenal erosions likely NSAID effect, chronic duodenitis on path    ESOPHAGOGASTRODUODENOSCOPY (EGD) WITH PROPOFOL  N/A 03/21/2018   Dr. Shaaron: mild Schatzki ring s/p dilation   FOREIGN BODY REMOVAL Right 10/06/2013   Procedure: REMOVAL FOREIGN BODY EXTREMITY;  Surgeon: Taft FORBES Minerva, MD;  Location: [facility];  Service: Orthopedics;  Laterality: Right;   INJECTION KNEE Left 10/06/2013   Procedure: KNEE INJECTION;  Surgeon: Taft FORBES Minerva, MD;  Location: [facility];  Service: Orthopedics;  Laterality: Left;   JOINT REPLACEMENT Bilateral    knees   KNEE ARTHROSCOPY Right    KNEE ARTHROSCOPY WITH LATERAL MENISECTOMY Right 10/06/2013   Procedure: KNEE ARTHROSCOPY WITH LATERAL AND MEDIAL MENISECTOMY;  Surgeon: Taft FORBES Minerva, MD;  Location: [facility];  Service: Orthopedics;  Laterality: Right;  END @ 1234   lumpectomy on pelvis     mass of nerves   MALONEY DILATION N/A 03/21/2018   Procedure: MALONEY DILATION;  Surgeon: Shaaron Lamar HERO, MD;  Location: [facility];  Service: Endoscopy;  Laterality: N/A;   MASTECTOMY COMPLETE / SIMPLE W/ SENTINEL NODE BIOPSY Right 11/20/2014   axillary   REMOVAL OF TISSUE EXPANDER AND PLACEMENT OF IMPLANT Right 12/13/2014   Procedure: REMOVAL OF RIGHT BREAST TISSUE EXPANDER;  Surgeon: Alm Sick, MD;  Location: [facility] OR;  Service: Plastics;  Laterality: Right;   ROBOTIC ASSITED PARTIAL NEPHRECTOMY Right 01/08/2016   Procedure: XI ROBOTIC ASSITED PARTIAL NEPHRECTOMY;  Surgeon: Ricardo Likens, MD;  Location: [facility];  Service: Urology;  Laterality: Right;   SIMPLE MASTECTOMY WITH AXILLARY SENTINEL NODE BIOPSY Right 11/20/2014   Procedure: SIMPLE MASTECTOMY WITH AXILLARY SENTINEL NODE BIOPSY;  Surgeon: Debby Shipper, MD;  Location: [facility] OR;  Service: General;  Laterality: Right;   TISSUE EXPANDER REMOVAL Right 12/13/2014   dr sick   TONSILLECTOMY     TOTAL KNEE ARTHROPLASTY Right  01/24/2014   Procedure: TOTAL KNEE ARTHROPLASTY;  Surgeon: Taft FORBES Minerva, MD;  Location: [facility];  Service: Orthopedics;  Laterality: Right;   TOTAL KNEE ARTHROPLASTY Left 08/08/2014   Procedure: TOTAL KNEE ARTHROPLASTY;  Surgeon: Taft FORBES Minerva, MD;  Location: [facility];  Service: Orthopedics;  Laterality: Left;   TUBAL LIGATION     UPPER GASTROINTESTINAL ENDOSCOPY        Medications: Prior to Admission medications   Medication Sig Start Date End Date Taking? Authorizing Provider  acetaminophen  (TYLENOL ) 500 MG tablet Take 650 mg by mouth as needed for moderate pain or headache.    [provider]  aspirin  EC 81 MG tablet Take 81 mg by mouth daily. Swallow whole.    [provider]  atorvastatin  (LIPITOR) 40 MG tablet Take 40 mg by mouth daily. 04/07/24   [provider]  dapagliflozin propanediol (FARXIGA) 10 MG TABS tablet Take 10 mg by mouth. 03/13/24 03/13/25  [provider]  gabapentin  (NEURONTIN ) 600 MG tablet  TAKE 1 TABLET BY MOUTH TWICE  DAILY 06/23/24   Margrette Taft BRAVO, MD  levothyroxine  (SYNTHROID ) 150 MCG tablet Take 1 tablet (150 mcg total) by mouth daily. 12/23/22   Krishnan, Gokul, MD  Magnesium  400 MG CAPS Take 400 mg by mouth daily.    [provider]  metoprolol  succinate (TOPROL  XL) 25 MG 24 hr tablet Take 1 1/2 tablet in the morning (37.5 mg) and take 1/2 (12.5mg ) tablet at night. 04/28/24   Strader, Laymon HERO, PA-C  Multiple Vitamins-Minerals (CENTRUM SILVER PO) Take 1 tablet by mouth daily.    [provider]  omeprazole (PRILOSEC) 20 MG capsule Take 20 mg by mouth daily. May take a second 20 mg dose as needed for heartburn    [provider]  potassium chloride  (KLOR-CON ) 20 MEQ packet Take 20 mEq by mouth 2 (two) times daily. 04/07/24   Strader, Laymon HERO, PA-C  torsemide  (DEMADEX ) 20 MG tablet Take 3 tablets (60 mg total) by mouth daily. 02/04/24   Burnard Debby LABOR, MD  Vibegron (GEMTESA) 75 MG TABS Take  75 mg by mouth daily. 02/23/24   [provider]  Vitamin D , Ergocalciferol , (DRISDOL ) 1.25 MG (50000 UNIT) CAPS capsule Take 1 capsule (50,000 Units total) by mouth every 14 (fourteen) days. 04/05/24   Burns, Jennifer E, NP  Ranitidine HCl (ZANTAC 75 PO) Take by mouth.    12/01/11  [provider]     Family History  Problem Relation Age of Onset   Stroke Sister    Heart attack Sister        In her 56s   Osteoarthritis Sister    Heart attack Sister        In her 39s   Osteoarthritis Brother    Obesity Brother    Heart attack Sister        In her 30s   Heart attack Father        He died at age 75   Lung cancer Maternal Grandmother        non-smoker   Colon cancer Neg Hx        Mother died at 91 from blood clot, MI    Social History   Socioeconomic History   Marital status: Married    Spouse name: Meribeth   Number of children: 4   Years of education: Not on file   Highest education level: Not on file  Occupational History   Occupation: disability    Employer: UNEMPLOYED    Comment: since 2008, knee surgery  Tobacco Use   Smoking status: Former    Current packs/day: 0.00    Average packs/day: 1 pack/day for 27.0 years (27.0 ttl pk-yrs)    Types: Cigarettes    Start date: 12/27/1958    Quit date: 12/26/1985    Years since quitting: 38.5   Smokeless tobacco: Never   Tobacco comments:    Quit in 1987  Vaping Use   Vaping status: Never Used  Substance and Sexual Activity   Alcohol use: Not Currently    Comment: occasional wine   Drug use: No   Sexual activity: Yes    Partners: Male    Birth control/protection: Surgical    Comment: married  Other Topics Concern   Not on file  Social History Narrative   Currently married for 10 years. Husband's name is Meribeth. She is an excellent singer. 8 children between she and her husband. 23 grand children. 12 great grandchildren.   Social Drivers of  Health   Financial Resource Strain: Not on file  Food Insecurity:  No Food Insecurity (05/31/2024)   Hunger Vital Sign    Worried About Running Out of Food in the Last Year: Never true    Ran Out of Food in the Last Year: Never true  Transportation Needs: No Transportation Needs (05/31/2024)   PRAPARE - Administrator, Civil Service (Medical): No    Lack of Transportation (Non-Medical): No  Physical Activity: Not on file  Stress: Not on file  Social Connections: Not on file       Review of Systems currently denies fever, headache, chest pain, dyspnea, cough, abdominal pain, nausea, vomiting or bleeding.  She does have some mild right flank discomfort.  Vital Signs: Vitals:   06/29/24 0936  BP: 119/64  Pulse: (!) 55  Resp: 17  Temp: 98.3 F (36.8 C)  SpO2: 98%      Advance Care Plan: No documents on file.    Physical Exam awake, alert.  Chest clear to auscultation bilaterally.  Heart rate slightly bradycardic but regular rhythm.  Abdomen obese, soft, positive bowel sounds, nontender.  Bilateral lower extremity edema.  Imaging: ECHOCARDIOGRAM COMPLETE Result Date: 06/28/2024    ECHOCARDIOGRAM REPORT   Patient Name:   KELLE RUPPERT Date of Exam: 06/28/2024 Medical Rec #:  [id number]         Height:       66.0 in Accession #:    [id number]        Weight:       216.0 lb Date of Birth:  10-22-1945         BSA:          2.066 m Patient Age:    78 years          BP:           119/76 mmHg Patient Gender: F                 HR:           75 bpm. Exam Location:  [facility] Procedure: 2D Echo, 3D Echo, Cardiac Doppler, Color Doppler and Strain Analysis            (Both Spectral and Color Flow Doppler were utilized during            procedure). Indications:    Chemo Z09  History:        Patient has prior history of Echocardiogram examinations, most                 recent 12/21/2022. Risk Factors:Hypertension, Former Smoker,                 Dyslipidemia and Sleep Apnea. Hx of Malignant neoplasm of upper                 outer quadrant of female breast  (HCC).  Sonographer:    Aida Pizza RCS Referring Phys: [id number] [facility]  Sonographer Comments: Global longitudinal strain was attempted. IMPRESSIONS  1. Left ventricular ejection fraction, by estimation, is 55 to 60%. Left ventricular ejection fraction by 3D volume is 59 %. The left ventricle has normal function. The left ventricle has no regional wall motion abnormalities. Left ventricular diastolic  parameters are consistent with Grade I diastolic dysfunction (impaired relaxation). The average left ventricular global longitudinal strain is -16.1 %. The global longitudinal strain is normal.  2. Right ventricular systolic function is normal. The right ventricular size is  normal. There is normal pulmonary artery systolic pressure.  3. A small pericardial effusion is present. The pericardial effusion is posterior and lateral to the left ventricle. There is no evidence of cardiac tamponade.  4. The mitral valve is normal in structure. Trivial mitral valve regurgitation. No evidence of mitral stenosis.  5. Tricuspid valve regurgitation is moderate.  6. The aortic valve is tricuspid. Aortic valve regurgitation is not visualized. No aortic stenosis is present.  7. The inferior vena cava is dilated in size with >50% respiratory variability, suggesting right atrial pressure of 8 mmHg. FINDINGS  Left Ventricle: Left ventricular ejection fraction, by estimation, is 55 to 60%. Left ventricular ejection fraction by 3D volume is 59 %. The left ventricle has normal function. The left ventricle has no regional wall motion abnormalities. The average left ventricular global longitudinal strain is -16.1 %. Strain was performed and the global longitudinal strain is normal. The left ventricular internal cavity size was normal in size. There is no left ventricular hypertrophy. Left ventricular diastolic parameters are consistent with Grade I diastolic dysfunction (impaired relaxation). Normal left ventricular filling pressure.  Right Ventricle: The right ventricular size is normal. No increase in right ventricular wall thickness. Right ventricular systolic function is normal. There is normal pulmonary artery systolic pressure. The tricuspid regurgitant velocity is 2.37 m/s, and  with an assumed right atrial pressure of 8 mmHg, the estimated right ventricular systolic pressure is 30.5 mmHg. Left Atrium: Left atrial size was normal in size. Right Atrium: Right atrial size was normal in size. Pericardium: A small pericardial effusion is present. The pericardial effusion is posterior and lateral to the left ventricle. There is no evidence of cardiac tamponade. Mitral Valve: The mitral valve is normal in structure. Trivial mitral valve regurgitation. No evidence of mitral valve stenosis. Tricuspid Valve: The tricuspid valve is normal in structure. Tricuspid valve regurgitation is moderate . No evidence of tricuspid stenosis. Aortic Valve: The aortic valve is tricuspid. Aortic valve regurgitation is not visualized. No aortic stenosis is present. Pulmonic Valve: The pulmonic valve was normal in structure. Pulmonic valve regurgitation is mild. No evidence of pulmonic stenosis. Aorta: The aortic root is normal in size and structure. Venous: The inferior vena cava is dilated in size with greater than 50% respiratory variability, suggesting right atrial pressure of 8 mmHg. IAS/Shunts: No atrial level shunt detected by color flow Doppler. Additional Comments: 3D was performed not requiring image post processing on an independent workstation and was normal.  LEFT VENTRICLE PLAX 2D LVIDd:         4.30 cm         Diastology LVIDs:         2.50 cm         LV e' medial:    6.84 cm/s LV PW:         0.90 cm         LV E/e' medial:  10.3 LV IVS:        0.80 cm         LV e' lateral:   6.47 cm/s LVOT diam:     1.90 cm         LV E/e' lateral: 10.9 LV SV:         66 LV SV Index:   32              2D Longitudinal LVOT Area:     2.84 cm        Strain  2D Strain GLS   -16.1 %                                Avg:                                 3D Volume EF                                LV 3D EF:    Left                                             ventricul                                             ar                                             ejection                                             fraction                                             by 3D                                             volume is                                             59 %.                                 3D Volume EF:                                3D EF:        59 %                                LV EDV:       78 ml                                LV ESV:       32 ml  LV SV:        46 ml RIGHT VENTRICLE RV S prime:     14.80 cm/s TAPSE (M-mode): 2.0 cm LEFT ATRIUM             Index        RIGHT ATRIUM           Index LA diam:        2.80 cm 1.36 cm/m   RA Area:     17.50 cm LA Vol (A2C):   40.4 ml 19.55 ml/m  RA Volume:   43.10 ml  20.86 ml/m LA Vol (A4C):   36.8 ml 17.81 ml/m LA Biplane Vol: 41.6 ml 20.13 ml/m  AORTIC VALVE LVOT Vmax:   93.20 cm/s LVOT Vmean:  63.800 cm/s LVOT VTI:    0.234 m  AORTA Ao Root diam: 3.40 cm MITRAL VALVE                TRICUSPID VALVE MV Area (PHT): 2.00 cm     TR Peak grad:   22.5 mmHg MV Decel Time: 380 msec     TR Vmax:        237.00 cm/s MV E velocity: 70.20 cm/s MV A velocity: 107.00 cm/s  SHUNTS MV E/A ratio:  0.66         Systemic VTI:  0.23 m                             Systemic Diam: 1.90 cm Vishnu Priya Mallipeddi Electronically signed by Diannah Late Mallipeddi Signature Date/Time: 06/28/2024/12:42:35 PM    Final    NM PET Image Initial (PI) Whole Body Result Date: 06/17/2024 CLINICAL DATA:  Initial treatment strategy for hematologic malignancy. Renal amyloidosis. Additional history of breast carcinoma EXAM: NUCLEAR MEDICINE PET WHOLE BODY TECHNIQUE: 11.4 mCi F-18 FDG was injected  intravenously. Full-ring PET imaging was performed from the head to foot after the radiotracer. CT data was obtained and used for attenuation correction and anatomic localization. Fasting blood glucose: 99 mg/dl COMPARISON:  None Available. FINDINGS: HEAD/NECK: No hypermetabolic activity in the scalp. No hypermetabolic cervical lymph nodes. Incidental CT findings: none CHEST: No hypermetabolic mediastinal or hilar nodes. No suspicious pulmonary nodules on the CT scan. Incidental CT findings: Post RIGHT mastectomy. ABDOMEN/PELVIS: No abnormal hypermetabolic activity within the liver, pancreas, adrenal glands, or spleen. No hypermetabolic lymph nodes in the abdomen or pelvis. Incidental CT findings: none SKELETON: No focal hypermetabolic activity to suggest skeletal metastasis. Incidental CT findings: No suspicious lytic lesions on CT portion exam. Posterior lumbar fusion EXTREMITIES: No abnormal hypermetabolic activity in the lower extremities. Incidental CT findings: No suspicious lytic lesions on the CT portion exam. Knee prosthetics IMPRESSION: 1. No evidence of multiple myeloma on whole-body FDG PET scan. 2. No evidence of breast cancer recurrence. 3. No renal abnormality PET-CT scan. Electronically Signed   By: Jackquline Boxer M.D.   On: 06/17/2024 16:53    Labs:  CBC: Recent Labs    02/24/24 2005 03/04/24 2200 04/28/24 0926 05/31/24 1446  WBC 9.6 11.5* 7.6 7.0  HGB 13.6 13.9 14.6 14.2  HCT 40.7 42.4 44.1 43.2  PLT 236 210 235 221    COAGS: Recent Labs    04/28/24 0926  INR 1.0    BMP: Recent Labs    10/25/23 1401 02/24/24 2005 03/04/24 2200 03/08/24 1526 05/31/24 1446  NA 142 141 140 145* 141  K 3.8 2.9* 3.7 4.7 3.7  CL  99 102 99 102 97*  CO2 29 29 29 28 30   GLUCOSE 108* 120* 96 84 78  BUN 15 19 20 20 18   CALCIUM  8.7* 7.7* 8.3* 8.7 8.3*  CREATININE 1.17* 1.23* 1.11* 1.39* 1.20*  GFRNONAA 48* 46* 52*  --  47*    LIVER FUNCTION TESTS: Recent Labs    10/25/23 1401  02/24/24 2005 05/31/24 1446  BILITOT 0.7 0.4 0.5  AST 47* 38 42*  ALT 28 25 30   ALKPHOS 71 71 91  PROT 6.3* 5.4* 6.2*  ALBUMIN  2.4* 2.1* 2.4*    TUMOR MARKERS: No results for input(s): AFPTM, CEA, CA199, CHROMGRNA in the last 8760 hours.  Assessment and Plan: Patient with history of recently diagnosed AL amyloidosis; abnormal serum protein electrophoresis Request for  image guided bone marrow biopsy and aspiration approved for 10/9 with Dr. Philip to rule out myeloma  Risks and benefits of bone marrow biopsy and aspiration was discussed with the patient including, but not limited to bleeding, infection, damage to adjacent structures or low yield requiring additional tests.  All of the questions were answered and there is agreement to proceed.  Consent signed and in chart. Pt ate toast and drank coffee this am at 0730 therefore will use local anesthetic/IV fentanyl  only during procedure  Thank you for allowing our service to participate in ERLA BACCHI 's care.    Electronically Signed: Laymon Coast, NP  Gabino Shylah Dossantos,PA-C 06/28/2024, 2:30 PM     I spent a total of  20 minutes in face to face in clinical consultation, greater than 50% of which was counseling/coordinating care for image guided bone marrow biopsy   (A copy of this note was sent to the referring provider and the time of visit.)

## 2024-06-29 ENCOUNTER — Ambulatory Visit (HOSPITAL_COMMUNITY)
Admission: RE | Admit: 2024-06-29 | Discharge: 2024-06-29 | Disposition: A | Source: Ambulatory Visit | Attending: Family Medicine

## 2024-06-29 ENCOUNTER — Ambulatory Visit (HOSPITAL_COMMUNITY)
Admission: RE | Admit: 2024-06-29 | Discharge: 2024-06-29 | Disposition: A | Discharge status: Home / Self Care | Source: Ambulatory Visit | Attending: Oncology | Admitting: Oncology

## 2024-06-29 ENCOUNTER — Encounter (HOSPITAL_COMMUNITY): Payer: Self-pay

## 2024-06-29 ENCOUNTER — Other Ambulatory Visit: Payer: Self-pay

## 2024-06-29 DIAGNOSIS — C9000 Multiple myeloma not having achieved remission: Secondary | ICD-10-CM | POA: Insufficient documentation

## 2024-06-29 DIAGNOSIS — Z7982 Long term (current) use of aspirin: Secondary | ICD-10-CM | POA: Diagnosis not present

## 2024-06-29 DIAGNOSIS — I129 Hypertensive chronic kidney disease with stage 1 through stage 4 chronic kidney disease, or unspecified chronic kidney disease: Secondary | ICD-10-CM | POA: Insufficient documentation

## 2024-06-29 DIAGNOSIS — N189 Chronic kidney disease, unspecified: Secondary | ICD-10-CM | POA: Diagnosis not present

## 2024-06-29 DIAGNOSIS — Z7984 Long term (current) use of oral hypoglycemic drugs: Secondary | ICD-10-CM | POA: Insufficient documentation

## 2024-06-29 DIAGNOSIS — E859 Amyloidosis, unspecified: Secondary | ICD-10-CM | POA: Insufficient documentation

## 2024-06-29 DIAGNOSIS — Z79899 Other long term (current) drug therapy: Secondary | ICD-10-CM | POA: Insufficient documentation

## 2024-06-29 DIAGNOSIS — E8581 Light chain (AL) amyloidosis: Secondary | ICD-10-CM

## 2024-06-29 DIAGNOSIS — Z79890 Hormone replacement therapy (postmenopausal): Secondary | ICD-10-CM | POA: Insufficient documentation

## 2024-06-29 DIAGNOSIS — Z87891 Personal history of nicotine dependence: Secondary | ICD-10-CM | POA: Insufficient documentation

## 2024-06-29 DIAGNOSIS — D47Z9 Other specified neoplasms of uncertain behavior of lymphoid, hematopoietic and related tissue: Secondary | ICD-10-CM | POA: Diagnosis not present

## 2024-06-29 LAB — CBC WITH DIFFERENTIAL/PLATELET
Abs Immature Granulocytes: 0.01 K/uL (ref 0.00–0.07)
Basophils Absolute: 0.1 K/uL (ref 0.0–0.1)
Basophils Relative: 1 %
Eosinophils Absolute: 0.1 K/uL (ref 0.0–0.5)
Eosinophils Relative: 2 %
HCT: 40.6 % (ref 36.0–46.0)
Hemoglobin: 13 g/dL (ref 12.0–15.0)
Immature Granulocytes: 0 %
Lymphocytes Relative: 40 %
Lymphs Abs: 2.6 K/uL (ref 0.7–4.0)
MCH: 31.5 pg (ref 26.0–34.0)
MCHC: 32 g/dL (ref 30.0–36.0)
MCV: 98.3 fL (ref 80.0–100.0)
Monocytes Absolute: 0.6 K/uL (ref 0.1–1.0)
Monocytes Relative: 9 %
Neutro Abs: 3 K/uL (ref 1.7–7.7)
Neutrophils Relative %: 48 %
Platelets: 200 K/uL (ref 150–400)
RBC: 4.13 MIL/uL (ref 3.87–5.11)
RDW: 13.9 % (ref 11.5–15.5)
WBC: 6.4 K/uL (ref 4.0–10.5)
nRBC: 0 % (ref 0.0–0.2)

## 2024-06-29 MED ORDER — SODIUM CHLORIDE 0.9 % IV SOLN: INTRAVENOUS | Status: DC

## 2024-06-29 MED ORDER — MIDAZOLAM HCL 2 MG/2ML IJ SOLN
INTRAMUSCULAR | Status: AC | PRN
  Administered 2024-06-29 (×2): 1 mg via INTRAVENOUS

## 2024-06-29 MED ORDER — MIDAZOLAM HCL 2 MG/2ML IJ SOLN
INTRAMUSCULAR | Status: AC
  Filled 2024-06-29: qty 2

## 2024-06-29 NOTE — Procedures (Signed)
 Interventional Radiology Procedure:   Indications: Amyloidosis. Evaluate for myeloma   Procedure: CT guided bone marrow biopsy  Findings: 2 aspirates and 1 core from right ilium  Complications: None     EBL: Minimal, less than 10 ml  Plan: Discharge to home in one hour.   Elvera Almario R. Philip, MD  Pager: [phone]

## 2024-06-29 NOTE — Discharge Instructions (Signed)
 Please call Interventional Radiology clinic [phone] with any questions or concerns.  You may remove your dressing and shower tomorrow.   Bone Marrow Aspiration and Bone Marrow Biopsy, Adult, Care After This sheet gives you information about how to care for yourself after your procedure. Your health care provider may also give you more specific instructions. If you have problems or questions, contact your health care provider. What can I expect after the procedure? After the procedure, it is common to have: Mild pain and tenderness. Swelling. Bruising. Follow these instructions at home: Puncture site care Follow instructions from your health care provider about how to take care of the puncture site. Make sure you: Wash your hands with soap and water before and after you change your bandage (dressing). If soap and water are not available, use hand sanitizer. Change your dressing as told by your health care provider. Check your puncture site every day for signs of infection. Check for: More redness, swelling, or pain. Fluid or blood. Warmth. Pus or a bad smell.   Activity Return to your normal activities as told by your health care provider. Ask your health care provider what activities are safe for you. Do not lift anything that is heavier than 10 lb (4.5 kg), or the limit that you are told, until your health care provider says that it is safe. Do not drive for 24 hours if you were given a sedative during your procedure. General instructions Take over-the-counter and prescription medicines only as told by your health care provider. Do not take baths, swim, or use a hot tub until your health care provider approves. Ask your health care provider if you may take showers. You may only be allowed to take sponge baths. If directed, put ice on the affected area. To do this: Put ice in a plastic bag. Place a towel between your skin and the bag. Leave the ice on for 20 minutes, 2-3 times a  day. Keep all follow-up visits as told by your health care provider. This is important.   Contact a health care provider if: Your pain is not controlled with medicine. You have a fever. You have more redness, swelling, or pain around the puncture site. You have fluid or blood coming from the puncture site. Your puncture site feels warm to the touch. You have pus or a bad smell coming from the puncture site. Summary After the procedure, it is common to have mild pain, tenderness, swelling, and bruising. Follow instructions from your health care provider about how to take care of the puncture site and what activities are safe for you. Take over-the-counter and prescription medicines only as told by your health care provider. Contact a health care provider if you have any signs of infection, such as fluid or blood coming from the puncture site. This information is not intended to replace advice given to you by your health care provider. Make sure you discuss any questions you have with your health care provider. Document Revised: 01/24/2019 Document Reviewed: 01/24/2019 Elsevier Patient Education  2021 [employer] Inc[employer]   Moderate Conscious Sedation, Adult, Care After This sheet gives you information about how to care for yourself after your procedure. Your health care provider may also give you more specific instructions. If you have problems or questions, contact your health care provider. What can I expect after the procedure? After the procedure, it is common to have: Sleepiness for several hours. Impaired judgment for several hours. Difficulty with balance. Vomiting if you eat too  soon. Follow these instructions at home: For the time period you were told by your health care provider: Rest. Do not participate in activities where you could fall or become injured. Do not drive or use machinery. Do not drink alcohol. Do not take sleeping pills or medicines that cause drowsiness. Do not  make important decisions or sign legal documents. Do not take care of children on your own.      Eating and drinking Follow the diet recommended by your health care provider. Drink enough fluid to keep your urine pale yellow. If you vomit: Drink water, juice, or soup when you can drink without vomiting. Make sure you have little or no nausea before eating solid foods.   General instructions Take over-the-counter and prescription medicines only as told by your health care provider. Have a responsible adult stay with you for the time you are told. It is important to have someone help care for you until you are awake and alert. Do not smoke. Keep all follow-up visits as told by your health care provider. This is important. Contact a health care provider if: You are still sleepy or having trouble with balance after 24 hours. You feel light-headed. You keep feeling nauseous or you keep vomiting. You develop a rash. You have a fever. You have redness or swelling around the IV site. Get help right away if: You have trouble breathing. You have new-onset confusion at home. Summary After the procedure, it is common to feel sleepy, have impaired judgment, or feel nauseous if you eat too soon. Rest after you get home. Know the things you should not do after the procedure. Follow the diet recommended by your health care provider and drink enough fluid to keep your urine pale yellow. Get help right away if you have trouble breathing or new-onset confusion at home. This information is not intended to replace advice given to you by your health care provider. Make sure you discuss any questions you have with your health care provider. Document Revised: 01/05/2020 Document Reviewed: 08/03/2019 Elsevier Patient Education  2021 [employer].

## 2024-07-04 ENCOUNTER — Encounter: Payer: Self-pay | Admitting: Student

## 2024-07-04 ENCOUNTER — Ambulatory Visit: Attending: Student | Admitting: Student

## 2024-07-04 VITALS — BP 114/66 | HR 62 | Ht 66.0 in | Wt 216.0 lb

## 2024-07-04 DIAGNOSIS — I471 Supraventricular tachycardia, unspecified: Secondary | ICD-10-CM | POA: Diagnosis not present

## 2024-07-04 DIAGNOSIS — I10 Essential (primary) hypertension: Secondary | ICD-10-CM | POA: Diagnosis not present

## 2024-07-04 DIAGNOSIS — E8581 Light chain (AL) amyloidosis: Secondary | ICD-10-CM

## 2024-07-04 DIAGNOSIS — I2510 Atherosclerotic heart disease of native coronary artery without angina pectoris: Secondary | ICD-10-CM | POA: Diagnosis not present

## 2024-07-04 DIAGNOSIS — E785 Hyperlipidemia, unspecified: Secondary | ICD-10-CM

## 2024-07-04 MED ORDER — NITROGLYCERIN 0.4 MG SL SUBL: 0.4000 mg | SUBLINGUAL_TABLET | SUBLINGUAL | 3 refills | Status: AC | PRN

## 2024-07-04 NOTE — Progress Notes (Signed)
 Cardiology Office Note    Date:  07/04/2024  ID:  LYBERTI THRUSH, DOB 1946/08/19, MRN [id number] Cardiologist: Previously Sara [name] --> will switch to Sara [name] as she prefers to follow-up in [facility]) Electrophysiologist:  Sara ONEIDA HOLTS, MD { :  History of Present Illness:    Sara [name] is a 78 y.o. female with past medical history of syncope, HTN, HLD, SVT, history of OSA (on CPAP), Stage III CKD and CAD (s/p Coronary CT in 02/2020 showing 50 to 74% stenosis along mid RCA but likely overestimated with normal FFR and minimal plaque elsewhere, cardiac PET in 04/2023 showing no evidence of ischemia and a low-risk study) who presents to the office today for 62-month follow-up.  She was last examined by myself in 02/2024 following a recent ED evaluation for chest pain and shortness of breath. At the time of follow-up, she denied any recurrent symptoms and only reported brief episodes which would last for a few seconds and then resolve. Recent cardiac PET had been reassuring and the possibility of a spasm was reviewed and she was started on Amlodipine  2.5 mg daily. If no improvement within a few weeks, was recommended she could discontinue. If she continued to have progressive symptoms despite medical therapy, was recommended to consider a cardiac catheterization for definitive evaluation in the future.  In talking with the patient and her husband today, she reports having one episode of chest pain several weeks ago which occurred while using the restroom and spontaneously resolved within a minute. Reports having occasional episodes of an aching pain which change with palpation and no association with exertion. She developed significant lower extremity edema with Amlodipine  2.5 mg daily and is no longer taking the medication. She has baseline dyspnea on exertion but no acute changes in this. Not active at baseline and says she sits in her chair a majority of the day. No specific  orthopnea, PND or pitting edema (since quitting Amlodipine ). Of note, she was recently diagnosed with AL amyloidosis and is being followed by Hematology/Oncology with follow-up later this month.  Studies Reviewed:   EKG: EKG is not ordered today.   Cardiac PET: 04/2023   The study is normal. The study is low risk.   EKG was not recorded during study   LV perfusion is normal. There is no evidence of ischemia. There is no evidence of infarction.   Rest left ventricular function is normal. Rest EF: 74%. Stress left ventricular function is normal. Stress EF: 76%. End diastolic cavity size is normal. End systolic cavity size is normal.   Myocardial blood flow was computed to be 0.49ml/g/min at rest and 1.20ml/g/min at stress. Global myocardial blood flow reserve was 2.05 and was normal.   Coronary calcium  was present on the attenuation correction CT images. Mild coronary calcifications were present. Coronary calcifications were present in the left anterior descending artery and right coronary artery distribution(s).   Electronically signed by Sara Nanas, MD   cMRI: 07/2023 IMPRESSION: 1.  Normal LV size and systolic function.  LVEF 64%.   2.  No LV LGE or scar.   3.  Normal LV ECV, no evidence for myocardial infiltration.   4.  Normal RV size and systolic function.   5   Small pericardial effusion.   6.  No significant valvular abnormalities.  Echocardiogram: 06/2024 IMPRESSIONS     1. Left ventricular ejection fraction, by estimation, is 55 to 60%. Left  ventricular ejection fraction by 3D volume is 59 %.  The left ventricle has  normal function. The left ventricle has no regional wall motion  abnormalities. Left ventricular diastolic   parameters are consistent with Grade I diastolic dysfunction (impaired  relaxation). The average left ventricular global longitudinal strain is  -16.1 %. The global longitudinal strain is normal.   2. Right ventricular systolic function is  normal. The right ventricular  size is normal. There is normal pulmonary artery systolic pressure.   3. A small pericardial effusion is present. The pericardial effusion is  posterior and lateral to the left ventricle. There is no evidence of  cardiac tamponade.   4. The mitral valve is normal in structure. Trivial mitral valve  regurgitation. No evidence of mitral stenosis.   5. Tricuspid valve regurgitation is moderate.   6. The aortic valve is tricuspid. Aortic valve regurgitation is not  visualized. No aortic stenosis is present.   7. The inferior vena cava is dilated in size with >50% respiratory  variability, suggesting right atrial pressure of 8 mmHg.    Physical Exam:   VS:  BP 114/66 (BP Location: Left Arm, Cuff Size: Large)   Pulse 62   Ht 5' 6 (1.676 m)   Wt 216 lb (98 kg)   SpO2 98%   BMI 34.86 kg/m    Wt Readings from Last 3 Encounters:  07/04/24 216 lb (98 kg)  06/29/24 215 lb (97.5 kg)  05/31/24 216 lb (98 kg)     GEN: Well nourished, well developed female appearing in no acute distress NECK: No JVD; No carotid bruits CARDIAC: RRR, no murmurs, rubs, gallops RESPIRATORY:  Clear to auscultation without rales, wheezing or rhonchi  ABDOMEN: Appears non-distended. No obvious abdominal masses. EXTREMITIES: No clubbing or cyanosis. No pitting edema.  Distal pedal pulses are 2+ bilaterally.   Assessment and Plan:   1. Coronary artery disease involving native coronary artery of native heart without angina pectoris - Cardiac PET in 04/2023 showed no evidence of ischemia and EF was normal with only mild coronary calcifications. Her episodes of pain have decreased in frequency. She was previously intolerant to Amlodipine  and we discussed possibly trying Imdur to see if that would help with symptoms but she prefers to avoid additional medications unless absolutely necessary at this time. Will provide with an updated Rx for SL NTG and we reviewed instructions for use.  Continue ASA 81mg  daily, Atorvastatin  40mg  daily, Farxiga 10mg  daily (on this per Nephrology) and Toprol -XL at current dosing. She does request a nutrition referral to help with following a renal diet and this will be entered.   2. SVT (supraventricular tachycardia) - She reports brief palpitations at times but no persistent symptoms. Continue current dosing of Toprol -XL at 37.5mg  in AM/12.5mg  in PM.   3. Essential hypertension - BP is well-controlled at 114/66 during today's visit. Continue Toprol -XL 37.5mg  in AM/12.5mg  in PM.   4. Hyperlipidemia LDL goal <70 - Followed by PCP. We previously requested most recent labs. Continue current medical therapy with Atorvastatin  20mg  daily.   5. OSA - Continued compliance with CPAP encouraged.  6. AL Amyloidosis - Diagnosed by recent kidney biopsy and being followed by Hematology/Oncology with follow-up later this month to discuss treatment options.   Signed, Laymon CHRISTELLA Qua, PA-C

## 2024-07-04 NOTE — Patient Instructions (Addendum)
 Medication Instructions:   Your physician recommends that you continue on your current medications as directed. Please refer to the Current Medication list given to you today.   Labwork: None today  Testing/Procedures: None today  Follow-Up: 6 months Dr.Mallipeddi  Any Other Special Instructions Will Be Listed Below (If Applicable).   Referral placed for Consult with nutritionist to discuss renal diet. They will call you to schedule an appointment.  If you need a refill on your cardiac medications before your next appointment, please call your pharmacy.

## 2024-07-10 ENCOUNTER — Telehealth: Admitting: Physician Assistant

## 2024-07-10 DIAGNOSIS — R300 Dysuria: Secondary | ICD-10-CM

## 2024-07-10 DIAGNOSIS — R310 Gross hematuria: Secondary | ICD-10-CM

## 2024-07-10 DIAGNOSIS — R10A2 Flank pain, left side: Secondary | ICD-10-CM

## 2024-07-10 NOTE — Progress Notes (Signed)
  Because of your history with a recent kidney biopsy and having left flank pain with hematuria (blood in the urine), I feel your condition warrants further evaluation and I recommend that you be seen in a face-to-face visit. It would be safest for you to be seen for a urinalysis and possible imaging to make sure no kidney stone or complication from the recent biopsy.   NOTE: There will be NO CHARGE for this E-Visit   If you are having a true medical emergency, please call 911.     For an urgent face to face visit, [facility] has multiple urgent care centers for your convenience.  Click the link below for the full list of locations and hours, walk-in wait times, appointment scheduling options and driving directions:  Urgent Care - [place], [place], [place], [place], [place], [place]  [place]     Your MyChart E-visit questionnaire answers were reviewed by a board certified advanced clinical practitioner to complete your personal care plan based on your specific symptoms.    Thank you for using e-Visits.

## 2024-07-11 ENCOUNTER — Encounter (HOSPITAL_COMMUNITY): Payer: Self-pay | Admitting: Oncology

## 2024-07-11 DIAGNOSIS — N390 Urinary tract infection, site not specified: Secondary | ICD-10-CM | POA: Diagnosis not present

## 2024-07-11 DIAGNOSIS — N3281 Overactive bladder: Secondary | ICD-10-CM | POA: Diagnosis not present

## 2024-07-17 DIAGNOSIS — E7849 Other hyperlipidemia: Secondary | ICD-10-CM | POA: Diagnosis not present

## 2024-07-17 DIAGNOSIS — R809 Proteinuria, unspecified: Secondary | ICD-10-CM | POA: Diagnosis not present

## 2024-07-17 DIAGNOSIS — E876 Hypokalemia: Secondary | ICD-10-CM | POA: Diagnosis not present

## 2024-07-17 DIAGNOSIS — E854 Organ-limited amyloidosis: Secondary | ICD-10-CM | POA: Diagnosis not present

## 2024-07-17 DIAGNOSIS — I10 Essential (primary) hypertension: Secondary | ICD-10-CM | POA: Diagnosis not present

## 2024-07-17 DIAGNOSIS — N1831 Chronic kidney disease, stage 3a: Secondary | ICD-10-CM | POA: Diagnosis not present

## 2024-07-19 ENCOUNTER — Inpatient Hospital Stay

## 2024-07-19 ENCOUNTER — Inpatient Hospital Stay: Attending: Oncology | Admitting: Oncology

## 2024-07-19 VITALS — BP 103/71 | HR 62 | Temp 98.8°F | Resp 17 | Ht 66.0 in | Wt 216.0 lb

## 2024-07-19 DIAGNOSIS — E8581 Light chain (AL) amyloidosis: Secondary | ICD-10-CM | POA: Insufficient documentation

## 2024-07-19 DIAGNOSIS — D0511 Intraductal carcinoma in situ of right breast: Secondary | ICD-10-CM

## 2024-07-19 DIAGNOSIS — C649 Malignant neoplasm of unspecified kidney, except renal pelvis: Secondary | ICD-10-CM

## 2024-07-19 LAB — COMPREHENSIVE METABOLIC PANEL WITH GFR
ALT: 28 U/L (ref 0–44)
AST: 45 U/L — ABNORMAL HIGH (ref 15–41)
Albumin: 2.9 g/dL — ABNORMAL LOW (ref 3.5–5.0)
Alkaline Phosphatase: 92 U/L (ref 38–126)
Anion gap: 12 (ref 5–15)
BUN: 17 mg/dL (ref 8–23)
CO2: 28 mmol/L (ref 22–32)
Calcium: 8.4 mg/dL — ABNORMAL LOW (ref 8.9–10.3)
Chloride: 102 mmol/L (ref 98–111)
Creatinine, Ser: 1.19 mg/dL — ABNORMAL HIGH (ref 0.44–1.00)
GFR, Estimated: 47 mL/min — ABNORMAL LOW (ref >60)
Glucose, Bld: 126 mg/dL — ABNORMAL HIGH (ref 70–99)
Potassium: 3.2 mmol/L — ABNORMAL LOW (ref 3.5–5.1)
Sodium: 142 mmol/L (ref 135–145)
Total Bilirubin: 0.3 mg/dL (ref 0.0–1.2)
Total Protein: 6 g/dL — ABNORMAL LOW (ref 6.5–8.1)

## 2024-07-19 LAB — CBC WITH DIFFERENTIAL/PLATELET
Abs Immature Granulocytes: 0.01 K/uL (ref 0.00–0.07)
Basophils Absolute: 0.1 K/uL (ref 0.0–0.1)
Basophils Relative: 1 %
Eosinophils Absolute: 0.2 K/uL (ref 0.0–0.5)
Eosinophils Relative: 3 %
HCT: 44.1 % (ref 36.0–46.0)
Hemoglobin: 14.6 g/dL (ref 12.0–15.0)
Immature Granulocytes: 0 %
Lymphocytes Relative: 44 %
Lymphs Abs: 2.9 K/uL (ref 0.7–4.0)
MCH: 31.7 pg (ref 26.0–34.0)
MCHC: 33.1 g/dL (ref 30.0–36.0)
MCV: 95.9 fL (ref 80.0–100.0)
Monocytes Absolute: 0.5 K/uL (ref 0.1–1.0)
Monocytes Relative: 8 %
Neutro Abs: 2.9 K/uL (ref 1.7–7.7)
Neutrophils Relative %: 44 %
Platelets: 152 K/uL (ref 150–400)
RBC: 4.6 MIL/uL (ref 3.87–5.11)
RDW: 13.7 % (ref 11.5–15.5)
WBC: 6.6 K/uL (ref 4.0–10.5)
nRBC: 0 % (ref 0.0–0.2)

## 2024-07-19 LAB — PRO BRAIN NATRIURETIC PEPTIDE: Pro Brain Natriuretic Peptide: 188 pg/mL (ref <300.0)

## 2024-07-19 LAB — URIC ACID: Uric Acid, Serum: 8.4 mg/dL — ABNORMAL HIGH (ref 2.5–7.1)

## 2024-07-19 NOTE — Patient Instructions (Addendum)
 [facility] Discharge Instructions   You were seen and examined today by Dr. Davonna.  She reviewed the results of your bone marrow biopsy which do not show any evidence of multiple myeloma.    We will see you back after your visit with [facility].   Return as scheduled.    Thank you for choosing [facility] at [facility] to provide your oncology and hematology care.  To afford each patient quality time with our provider, please arrive at least 15 minutes before your scheduled appointment time.   If you have a lab appointment with the Cancer Center please come in thru the Main Entrance and check in at the main information desk.  You need to re-schedule your appointment should you arrive 10 or more minutes late.  We strive to give you quality time with our providers, and arriving late affects you and other patients whose appointments are after yours.  Also, if you no show three or more times for appointments you may be dismissed from the clinic at the providers discretion.     Again, thank you for choosing [facility].  Our hope is that these requests will decrease the amount of time that you wait before being seen by our physicians.       _____________________________________________________________  Should you have questions after your visit to [facility], please contact our office at [phone] and follow the prompts.  Our office hours are 8:00 a.m. and 4:30 p.m. Monday - Friday.  Please note that voicemails left after 4:00 p.m. may not be returned until the following business day.  We are closed weekends and major holidays.  You do have access to a nurse 24-7, just call the main number to the clinic [phone] and do not press any options, hold on the line and a nurse will answer the phone.    For prescription refill requests, have your pharmacy contact our office and allow 72 hours.     Due to Covid, you will need to wear a mask upon entering the hospital. If you do not have a mask, a mask will be given to you at the Main Entrance upon arrival. For doctor visits, patients may have 1 support person age 41 or older with them. For treatment visits, patients can not have anyone with them due to social distancing guidelines and our immunocompromised population.

## 2024-07-19 NOTE — Progress Notes (Signed)
 Hematology-Oncology Clinic Note  [name], MD   Reason for Referral: Renal AL amyloidosis  Oncology History: I have reviewed her chart and materials related to her cancer extensively and collaborated history with the patient. Summary of oncologic history is as follows:  Diagnosis: Renal amyloidosis  -07/12/2023: FKLC:25.3, FLLC:61.5, Ratio: 2.43. SPEP: M spike:0.7, IFE: not available -03/29/2024: Urine protein/creat ratio: 7385 -04/24/2024: Renal function panel: Creatinine 1.15. Calcium  8.5. Albumin  2.6.  -04/24/2024: Hepatitis B and Hepatitis C negative. HIV negative.  -04/28/2024: Kidney biopsy.   Pathology: Lambda light chain AL renal amyloidosis. Moderate intersitial fibrosis and tubular atrophy (IFTA 30-40%) with 30% obsolescent glomeruli (6/20 glomeruli).  -05/31/2024: SPEP: M spike: 0.7, IFE: IgA monoclonal protein with lambda light chain specificity, beta-2  microglobulin: 3.8, kappa free light chain: 34, lambda free light chain: 86.9, ratio: 2.5 -05/31/2024: Flow cytometry: No abnormal B or T cells identified - 06/02/2024: Urine free light chains: Kappa free light chains: 92.42, lambda free light chains: 69.35, ratio: 1.3, IFE: IgA monoclonal protein with lambda light chain specificity, Bence-Jones proteins positive.  Total protein: 5574 mg/24-hour -06/15/2024: NM PET Image Initial (PI) Whole body:  No evidence of multiple myeloma on whole-body FDG PET scan. No evidence of breast cancer recurrence. No renal abnormality PET-CT scan. -06/29/2024: Bone marrow biopsy:  -Mildly hypercellular bone marrow (50%) involved by plasma cell neoplasm (6% plasma cells by manual aspirate differential, approximately 5% by  CD138 immunohistochemical analysis, and lambda monotypic by kappa/lambda in situ hybridization).   -Cytogenetics: Normal female karyotype - Multiple myeloma FISH: Pending -06/28/2024: Echo: LVEF: 55-60%   History of Presenting Illness: Sara [name] 78 y.o. female is  her for a follow up of her renal amyloidosis. Patient is accompanied by her husband today.  Patient reports feeling well with no significant complaints.  When inquired, she does report some neuropathy in her right big toe.  She has some baseline shortness of breath and right upper quadrant abdominal pain that she was previously evaluated for and was told that she did not have any problems. She has a normal appetite and does not report diarrhea.  She denies jaw pain, dizziness, pedal edema.  Medical History: Past Medical History:  Diagnosis Date   Allergic rhinitis due to pollen    Anxiety    Arthritis    knees, back, right elbow; left shoulder; right ankle (11/20/2014)   Breast cancer, right breast (HCC) 2016   DCIS; zero stage S/P mastectomy, cancer free since then   Cervical spondylosis without myelopathy    Cervicalgia    Chronic lower back pain    Constipation    takes Miralax  daily   Degeneration of cervical intervertebral disc    Depression    Diverticulosis of colon (without mention of hemorrhage)    GERD (gastroesophageal reflux disease)    takes Omeprazole daily   Graves' disease    took a pill to correct   H/O hiatal hernia    H/O urinary frequency    Headache(784.0)    denies migraines since the 80's but has occ and takes Butalbital prn   HTN (hypertension)    takes Metoprolol  and Lisinopril  daily   Insomnia    takes Ativan  nigtly   Joint pain    all of them   Joint swelling    knees, legs, ankles sometimes (11/20/2014)   Morbid obesity (HCC)    Numbness    LOWER LEGS   Other postablative hypothyroidism    takes SYnthroid  daily   Palpitations  Peptic ulcer, unspecified site, unspecified as acute or chronic, without mention of hemorrhage, perforation, or obstruction    Primary localized osteoarthrosis, lower leg    Pure hypercholesterolemia    takes Pravastin daily   Recurrent UTI    Renal cell carcinoma (HCC) 01/08/2016   Right renal mass s/p  partial right nephrectomy, cancer free since 2017   Shortness of breath    Sleep apnea    slight but doesn't require a CPAP (11/20/2014)   Tendonitis of knee    bilateral    Surgical history: Past Surgical History:  Procedure Laterality Date   ABDOMINAL HYSTERECTOMY     ANTERIOR LAT LUMBAR FUSION  05/13/2012   Procedure: ANTERIOR LATERAL LUMBAR FUSION 2 LEVELS;  Surgeon: Darina MALVA Boehringer, MD;  Location: [facility];  Service: Neurosurgery;  Laterality: Left;  Left Lumbar Three-four,Lumbar four-five Extreme Lumbar Interbody Fusion with Percutaneous Pedicle Screws   APPENDECTOMY     BREAST BIOPSY Left ~ 2014   BREAST BIOPSY Right 2015   BREAST LUMPECTOMY Left 1988   BREAST RECONSTRUCTION WITH PLACEMENT OF TISSUE EXPANDER AND FLEX HD (ACELLULAR HYDRATED DERMIS) Right 11/20/2014   BREAST RECONSTRUCTION WITH PLACEMENT OF TISSUE EXPANDER AND FLEX HD (ACELLULAR HYDRATED DERMIS) Right 11/20/2014   Procedure: RIGHT BREAST RECONSTRUCTION WITH PLACEMENT OF TISSUE EXPANDER AND ACELLULAR DERMA MATRIX (ACELLULAR DERMA MATRIX);  Surgeon: Alm Sick, MD;  Location: [facility] OR;  Service: Plastics;  Laterality: Right;   CATARACT EXTRACTION W/ INTRAOCULAR LENS  IMPLANT, BILATERAL Bilateral    COLONOSCOPY  Sept 2008   RMR: normal rectum, left-sided diverticula, repeat in 2018   COLONOSCOPY WITH PROPOFOL  N/A 03/21/2018   Dr. Shaaron: diverticulosis, internal grade I hemorrhoids. no future colonoscopies unless develops new symptoms   DIAGNOSTIC LAPAROSCOPY     DILATION AND CURETTAGE OF UTERUS     ESOPHAGOGASTRODUODENOSCOPY     ESOPHAGOGASTRODUODENOSCOPY  2013   Dr. Shaaron: Cervical esophageal web and Schatzki's ring s/p dilation, small hiatal hernia, antral and duodenal erosions likely NSAID effect, chronic duodenitis on path    ESOPHAGOGASTRODUODENOSCOPY (EGD) WITH PROPOFOL  N/A 03/21/2018   Dr. Shaaron: mild Schatzki ring s/p dilation   FOREIGN BODY REMOVAL Right 10/06/2013   Procedure: REMOVAL FOREIGN BODY EXTREMITY;   Surgeon: Taft FORBES Minerva, MD;  Location: [facility];  Service: Orthopedics;  Laterality: Right;   INJECTION KNEE Left 10/06/2013   Procedure: KNEE INJECTION;  Surgeon: Taft FORBES Minerva, MD;  Location: [facility];  Service: Orthopedics;  Laterality: Left;   JOINT REPLACEMENT Bilateral    knees   KNEE ARTHROSCOPY Right    KNEE ARTHROSCOPY WITH LATERAL MENISECTOMY Right 10/06/2013   Procedure: KNEE ARTHROSCOPY WITH LATERAL AND MEDIAL MENISECTOMY;  Surgeon: Taft FORBES Minerva, MD;  Location: [facility];  Service: Orthopedics;  Laterality: Right;  END @ 1234   lumpectomy on pelvis     mass of nerves   MALONEY DILATION N/A 03/21/2018   Procedure: MALONEY DILATION;  Surgeon: Shaaron Lamar HERO, MD;  Location: [facility];  Service: Endoscopy;  Laterality: N/A;   MASTECTOMY COMPLETE / SIMPLE W/ SENTINEL NODE BIOPSY Right 11/20/2014   axillary   REMOVAL OF TISSUE EXPANDER AND PLACEMENT OF IMPLANT Right 12/13/2014   Procedure: REMOVAL OF RIGHT BREAST TISSUE EXPANDER;  Surgeon: Alm Sick, MD;  Location: [facility] OR;  Service: Plastics;  Laterality: Right;   ROBOTIC ASSITED PARTIAL NEPHRECTOMY Right 01/08/2016   Procedure: XI ROBOTIC ASSITED PARTIAL NEPHRECTOMY;  Surgeon: Ricardo Likens, MD;  Location: [facility];  Service: Urology;  Laterality: Right;  SIMPLE MASTECTOMY WITH AXILLARY SENTINEL NODE BIOPSY Right 11/20/2014   Procedure: SIMPLE MASTECTOMY WITH AXILLARY SENTINEL NODE BIOPSY;  Surgeon: Debby Shipper, MD;  Location: [facility];  Service: General;  Laterality: Right;   TISSUE EXPANDER REMOVAL Right 12/13/2014   dr leora   TONSILLECTOMY     TOTAL KNEE ARTHROPLASTY Right 01/24/2014   Procedure: TOTAL KNEE ARTHROPLASTY;  Surgeon: Taft FORBES Minerva, MD;  Location: [facility];  Service: Orthopedics;  Laterality: Right;   TOTAL KNEE ARTHROPLASTY Left 08/08/2014   Procedure: TOTAL KNEE ARTHROPLASTY;  Surgeon: Taft FORBES Minerva, MD;  Location: [facility];  Service: Orthopedics;  Laterality: Left;   TUBAL LIGATION     UPPER  GASTROINTESTINAL ENDOSCOPY       Allergies:  is allergic to propoxyphene hcl, amlodipine , pollen extract, camphor, dust mite extract, latex, molds & smuts, and tomato.  Medications:  Current Outpatient Medications  Medication Sig Dispense Refill   acetaminophen  (TYLENOL ) 500 MG tablet Take 650 mg by mouth as needed for moderate pain or headache.     aspirin  EC 81 MG tablet Take 81 mg by mouth daily. Swallow whole.     atorvastatin  (LIPITOR) 40 MG tablet Take 40 mg by mouth daily.     dapagliflozin propanediol (FARXIGA) 10 MG TABS tablet Take 10 mg by mouth.     gabapentin  (NEURONTIN ) 600 MG tablet TAKE 1 TABLET BY MOUTH TWICE  DAILY 200 tablet 2   levothyroxine  (SYNTHROID ) 150 MCG tablet Take 1 tablet (150 mcg total) by mouth daily.     Magnesium  400 MG CAPS Take 400 mg by mouth daily.     metoprolol  succinate (TOPROL  XL) 25 MG 24 hr tablet Take 1 1/2 tablet in the morning (37.5 mg) and take 1/2 (12.5mg ) tablet at night. 180 tablet 3   Multiple Vitamins-Minerals (CENTRUM SILVER PO) Take 1 tablet by mouth daily.     nitroGLYCERIN  (NITROSTAT ) 0.4 MG SL tablet Place 1 tablet (0.4 mg total) under the tongue every 5 (five) minutes as needed for chest pain. 25 tablet 3   omeprazole (PRILOSEC) 20 MG capsule Take 20 mg by mouth daily. May take a second 20 mg dose as needed for heartburn     potassium chloride  (KLOR-CON ) 20 MEQ packet Take 20 mEq by mouth 2 (two) times daily. 180 packet 3   torsemide  (DEMADEX ) 20 MG tablet Take 3 tablets (60 mg total) by mouth daily. 270 tablet 3   Vibegron (GEMTESA) 75 MG TABS Take 75 mg by mouth daily.     Vitamin D , Ergocalciferol , (DRISDOL ) 1.25 MG (50000 UNIT) CAPS capsule Take 1 capsule (50,000 Units total) by mouth every 14 (fourteen) days. 6 capsule 3   No current facility-administered medications for this visit.    Review of Systems: Constitutional: Denies fevers, chills or abnormal night sweats Eyes: Denies blurriness of vision, double vision or watery  eyes Ears, nose, mouth, throat, and face: Denies mucositis or sore throat Respiratory: Denies cough, dyspnea or wheezes Cardiovascular: Denies palpitation, chest discomfort or lower extremity swelling Gastrointestinal:  Denies nausea, heartburn or change in bowel habits Skin: Denies abnormal skin rashes Lymphatics: Denies new lymphadenopathy or easy bruising Neurological: numbness, tingling  Behavioral/Psych: Mood is stable, no new changes  All other systems were reviewed with the patient and are negative.  Physical Examination: ECOG PERFORMANCE STATUS: 2 - Symptomatic, <50% confined to bed  Vitals:   07/19/24 1451  BP: 103/71  Pulse: 62  Resp: 17  Temp: 98.8 F (37.1 C)  SpO2: 100%  Filed Weights   07/19/24 1451  Weight: 216 lb (98 kg)     GENERAL:alert, no distress and comfortable SKIN: skin color, texture, turgor are normal, no rashes or significant lesions LYMPH:  no palpable lymphadenopathy in the cervical, axillary or inguinal LUNGS: clear to auscultation and percussion with normal breathing effort HEART: regular rate & rhythm and no murmurs and no lower extremity edema ABDOMEN:abdomen soft, non-tender and normal bowel sounds Musculoskeletal:no cyanosis of digits and no clubbing  PSYCH: alert & oriented x 3 with fluent speech NEURO: no focal motor/sensory deficits   Laboratory Data: I have reviewed the data as listed Lab Results  Component Value Date   WBC 6.6 07/19/2024   HGB 14.6 07/19/2024   HCT 44.1 07/19/2024   MCV 95.9 07/19/2024   PLT 152 07/19/2024   Recent Labs    02/24/24 2005 03/04/24 2200 03/08/24 1526 05/31/24 1446 07/19/24 1530  NA 141 140 145* 141 142  K 2.9* 3.7 4.7 3.7 3.2*  CL 102 99 102 97* 102  CO2 29 29 28 30 28   GLUCOSE 120* 96 84 78 126*  BUN 19 20 20 18 17   CREATININE 1.23* 1.11* 1.39* 1.20* 1.19*  CALCIUM  7.7* 8.3* 8.7 8.3* 8.4*  GFRNONAA 46* 52*  --  47* 47*  PROT 5.4*  --   --  6.2* 6.0*  ALBUMIN  2.1*  --   --   2.4* 2.9*  AST 38  --   --  42* 45*  ALT 25  --   --  30 28  ALKPHOS 71  --   --  91 92  BILITOT 0.4  --   --  0.5 0.3    Latest Reference Range & Units 06/02/24 11:43  Total Volume  1,750  Free Kappa Lt Chains,Ur 1.17 - 86.46 mg/L 92.42 (H)  Free Kappa/Lambda Ratio 1.83 - 14.26  1.33 (L) (C)  Free Lambda Lt Chains,Ur 0.27 - 15.21 mg/L 69.35 (H)  Immunofixation, Urine  Immunofixation shows IgA monoclonal protein with lambda light chain  specificity.  Bence Jones Protein positive; lambda type.   Total Protein, Urine-Ur/day 30 - 150 mg/24 hr 5,574 (H)  Total Protein, Urine-UPE24 Not Estab. mg/dL 681.4  (H): Data is abnormally high (L): Data is abnormally low !: Data is abnormal (C): Corrected   Latest Reference Range & Units 05/31/24 14:46  Kappa free light chain 3.3 - 19.4 mg/L 34.0 (H)  Lambda free light chains 5.7 - 26.3 mg/L 86.9 (H)  Kappa, lambda light chain ratio 0.26 - 1.65  0.39  (H): Data is abnormally high   Latest Reference Range & Units 05/31/24 14:45  Total Protein ELP 6.0 - 8.5 g/dL 6.0 - 8.5 g/dL 5.5 (L) 5.4 (L)  Albumin  ELP 2.9 - 4.4 g/dL 2.1 (L)  Globulin, Total 2.2 - 3.9 g/dL 3.3 (C)  A/G Ratio 0.7 - 1.7  0.6 (L) (C)  Alpha-1-Globulin 0.0 - 0.4 g/dL 0.3  Joeyj-7-Honalopw 0.4 - 1.0 g/dL 1.0  Beta Globulin 0.7 - 1.3 g/dL 1.6 (H)  Gamma Globulin 0.4 - 1.8 g/dL 0.4  M-SPIKE, % Not Observed g/dL 0.7 (H)  SPE Interp.  Comment  Comment  Comment  IgG (Immunoglobin G), Serum 586 - 1,602 mg/dL 498 (L)  IgM (Immunoglobulin M), Srm 26 - 217 mg/dL 53  IgA 64 - 577 mg/dL 067 (H)  (L): Data is abnormally low (H): Data is abnormally high (C): Corrected  Latest Reference Range & Units 05/31/24 14:46  Beta-2  Microglobulin 0.6 - 2.4 mg/L  3.8 (H)  (H): Data is abnormally high  Latest Reference Range & Units 05/31/24 14:46  LDH 98 - 192 U/L 222 (H)  (H): Data is abnormally high   ASSESSMENT & PLAN:  Patient is a 78 y.o. female presenting for AL amyloidosis  AL  amyloidosis (HCC) Diagnosis confirmed by kidney biopsy showing AL amyloid deposits. PET scan: Negative for lytic bone lesions Cardiac echo: Normal LVEF Elevated LDH, beta-2  microglobulin Bone marrow biopsy: No evidence of myeloma, 5% plasma cells by cellularity Elevated free light chains with slightly elevated ratio and serum and normal ratio in urine.  Total urine protein greater than 5 g.  IFE: IgA monoclonal protein with lambda light chain specificity   -We discussed the bone marrow biopsy results in detail.  C[place] red staining not available at this time.  Multiple myeloma FISH pending at this time - I will reach out to pathology to run [place] red staining on bone marrow biopsy specimen. - We reviewed the PET scan together.  Patient does not have any evidence of lytic bone lesions. - Will repeat labs today considering it has been 2 months since the previous labs.  Will obtain NT proBNP.  Previous 24-hour urine did not include new IFE.  Will repeat this today along with UPEP. - Discussed with the patient that she has systemic AL amyloidosis, she would require chemotherapy followed by probable bone marrow transplant if patient is a candidate and if not then maintenance treatment. -Will refer to bone marrow transplant at this time for further recommendations. -Can also consider renal transplantation if patient has renal only AL amyloidosis.   Return to clinic in 1 month for follow-up and further management  Ductal carcinoma in situ (DCIS) of right breast Diagnosed in December 2015 due to abnormal screening mammogram S/p right simple mastectomy on 11/20/2014, lymph node biopsy negative.  Grade 2, 10 cm, ER: Positive, 100%, PR: positive, 90%. Completed 5 years of tamoxifen .  She stopped the tamoxifen  on 01/19/2020.  - Continue yearly mammograms  Renal cell carcinoma, unspecified laterality (HCC) She had a T1a Nx RCC which was treated with partial right nephrectomy (01/08/2016). Last CT scan on  10/01/2017 showed no recurrence.  Hepatic steatosis was seen. She had a repeat CT scan in January 2021 which was reportedly normal.   Completed 5 years of surveillance with Dr.Manny   Orders Placed This Encounter  Procedures   CBC with Differential    Standing Status:   Future    Number of Occurrences:   1    Expected Date:   07/19/2024    Expiration Date:   10/17/2024   Comprehensive metabolic panel    Standing Status:   Future    Number of Occurrences:   1    Expected Date:   07/19/2024    Expiration Date:   10/17/2024   Uric acid    Standing Status:   Future    Number of Occurrences:   1    Expected Date:   07/19/2024    Expiration Date:   10/17/2024    Release to patient:   Immediate   Pro Brain natriuretic peptide    Standing Status:   Future    Number of Occurrences:   1    Expected Date:   07/19/2024    Expiration Date:   10/17/2024   Immunofixation electrophoresis    Standing Status:   Future    Number of Occurrences:   1    Expected Date:   07/19/2024  Expiration Date:   10/17/2024   Kappa/lambda light chains    Standing Status:   Future    Number of Occurrences:   1    Expected Date:   07/19/2024    Expiration Date:   10/17/2024   Protein electrophoresis, serum    Standing Status:   Future    Number of Occurrences:   1    Expected Date:   07/19/2024    Expiration Date:   10/17/2024   24 hr, Ur UPEP/UIFE/Light Chains/TP    Standing Status:   Future    Expected Date:   07/26/2024    Expiration Date:   10/24/2024   The total time spent in the appointment was 30 minutes encounter with patients including review of chart and various tests results, discussions about plan of care and coordination of care plan   All questions were answered. The patient knows to call the clinic with any problems, questions or concerns. No barriers to learning was detected.  I, Marijo Sharps, acting as a neurosurgeon for [employer], MD.,have documented all relevant documentation on the behalf  of Mickiel Dry, MD,as directed by  Mickiel Dry, MD while in the presence of Mickiel Dry, MD.  I, Mickiel Dry MD, have reviewed the above documentation for accuracy and completeness, and I agree with the above.    Mickiel Dry, MD 10/29/202511:34 PM

## 2024-07-20 DIAGNOSIS — E8581 Light chain (AL) amyloidosis: Secondary | ICD-10-CM | POA: Diagnosis not present

## 2024-07-20 LAB — KAPPA/LAMBDA LIGHT CHAINS
Kappa free light chain: 21.5 mg/L — ABNORMAL HIGH (ref 3.3–19.4)
Kappa, lambda light chain ratio: 0.24 — ABNORMAL LOW (ref 0.26–1.65)
Lambda free light chains: 90.8 mg/L — ABNORMAL HIGH (ref 5.7–26.3)

## 2024-07-21 ENCOUNTER — Other Ambulatory Visit: Payer: Self-pay | Admitting: *Deleted

## 2024-07-21 DIAGNOSIS — E8581 Light chain (AL) amyloidosis: Secondary | ICD-10-CM

## 2024-07-21 NOTE — Progress Notes (Signed)
 Orders released for 24 hour urine that patient returned today. Specimen sent to lab.

## 2024-07-22 LAB — PROTEIN ELECTROPHORESIS, SERUM
A/G Ratio: 0.8 (ref 0.7–1.7)
Albumin ELP: 2.4 g/dL — ABNORMAL LOW (ref 2.9–4.4)
Alpha-1-Globulin: 0.2 g/dL (ref 0.0–0.4)
Alpha-2-Globulin: 1 g/dL (ref 0.4–1.0)
Beta Globulin: 1.4 g/dL — ABNORMAL HIGH (ref 0.7–1.3)
Gamma Globulin: 0.4 g/dL (ref 0.4–1.8)
Globulin, Total: 3.1 g/dL (ref 2.2–3.9)
M-Spike, %: 0.7 g/dL — ABNORMAL HIGH
Total Protein ELP: 5.5 g/dL — ABNORMAL LOW (ref 6.0–8.5)

## 2024-07-24 LAB — IMMUNOFIXATION ELECTROPHORESIS
IgA: 917 mg/dL — ABNORMAL HIGH (ref 64–422)
IgG (Immunoglobin G), Serum: 506 mg/dL — ABNORMAL LOW (ref 586–1602)
IgM (Immunoglobulin M), Srm: 58 mg/dL (ref 26–217)
Total Protein ELP: 5.3 g/dL — ABNORMAL LOW (ref 6.0–8.5)

## 2024-07-24 LAB — SURGICAL PATHOLOGY

## 2024-07-27 ENCOUNTER — Encounter (HOSPITAL_COMMUNITY): Payer: Self-pay

## 2024-07-28 LAB — UPEP/UIFE/LIGHT CHAINS/TP, 24-HR UR
% BETA, Urine: 16.1 %
ALPHA 1 URINE: 8.8 %
Albumin, U: 67.2 %
Alpha 2, Urine: 5.5 %
Free Kappa Lt Chains,Ur: 101.8 mg/L — ABNORMAL HIGH (ref 1.17–86.46)
Free Kappa/Lambda Ratio: 1.15 — ABNORMAL LOW (ref 1.83–14.26)
Free Lambda Lt Chains,Ur: 88.68 mg/L — ABNORMAL HIGH (ref 0.27–15.21)
GAMMA GLOBULIN URINE: 2.4 %
M-SPIKE %, Urine: 8.4 % — ABNORMAL HIGH
M-Spike, Mg/24 Hr: 517 mg/(24.h) — ABNORMAL HIGH
Total Protein, Urine-Ur/day: 6152 mg/(24.h) — ABNORMAL HIGH (ref 30–150)
Total Protein, Urine: 384.5 mg/dL
Total Volume: 1600

## 2024-08-03 ENCOUNTER — Encounter: Payer: Self-pay | Admitting: Nephrology

## 2024-08-04 LAB — SURGICAL PATHOLOGY

## 2024-08-09 ENCOUNTER — Encounter: Attending: Student | Admitting: Nutrition

## 2024-08-09 ENCOUNTER — Encounter: Payer: Self-pay | Admitting: Nutrition

## 2024-08-09 VITALS — Ht 66.0 in | Wt 219.0 lb

## 2024-08-09 DIAGNOSIS — I10 Essential (primary) hypertension: Secondary | ICD-10-CM | POA: Insufficient documentation

## 2024-08-09 NOTE — Progress Notes (Signed)
 Medical Nutrition Therapy  Appointment Start time:  61  Appointment End time:  1200  Primary concerns today: HTN  Referral diagnosis: E Preferred learning style: NO Preference  Learning readiness: Ready   NUTRITION ASSESSMENT  78 yr old bfemale referred for HTN from Cardiologist. She notes she has CKD also and sees [facility]. CKD Stg 3a she reports. Her husband is here today with her. They typically eat at home mostly. Likes most foods. Willing to make some lifestyle changes to improve nutrition, hydration and physical activity. She would like to know how to eat to be healthier and lower BP and improve kidney function. BMI 35 Obesity CKD Creatine 1.19 and eGFR 47.  She is very interested in Lifestyle Medicine with a whole plant based diet and lifestyle to improve her health with HTN and CKD and lower co morbidities.  Clinical Medical Hx:  Past Medical History:  Diagnosis Date   Allergic rhinitis due to pollen    Anxiety    Arthritis    knees, back, right elbow; left shoulder; right ankle (11/20/2014)   Breast cancer, right breast (HCC) 2016   DCIS; zero stage S/P mastectomy, cancer free since then   Cervical spondylosis without myelopathy    Cervicalgia    Chronic lower back pain    Constipation    takes Miralax  daily   Degeneration of cervical intervertebral disc    Depression    Diverticulosis of colon (without mention of hemorrhage)    GERD (gastroesophageal reflux disease)    takes Omeprazole daily   Graves' disease    took a pill to correct   H/O hiatal hernia    H/O urinary frequency    Headache(784.0)    denies migraines since the 80's but has occ and takes Butalbital prn   HTN (hypertension)    takes Metoprolol  and Lisinopril  daily   Insomnia    takes Ativan  nigtly   Joint pain    all of them   Joint swelling    knees, legs, ankles sometimes (11/20/2014)   Morbid obesity (HCC)    Numbness    LOWER LEGS   Other postablative hypothyroidism     takes SYnthroid  daily   Palpitations    Peptic ulcer, unspecified site, unspecified as acute or chronic, without mention of hemorrhage, perforation, or obstruction    Primary localized osteoarthrosis, lower leg    Pure hypercholesterolemia    takes Pravastin daily   Recurrent UTI    Renal cell carcinoma (HCC) 01/08/2016   Right renal mass s/p partial right nephrectomy, cancer free since 2017   Shortness of breath    Sleep apnea    slight but doesn't require a CPAP (11/20/2014)   Tendonitis of knee    bilateral    Medications:  Current Outpatient Medications on File Prior to Visit  Medication Sig Dispense Refill   acetaminophen  (TYLENOL ) 500 MG tablet Take 650 mg by mouth as needed for moderate pain or headache.     aspirin  EC 81 MG tablet Take 81 mg by mouth daily. Swallow whole.     atorvastatin  (LIPITOR) 40 MG tablet Take 40 mg by mouth daily.     dapagliflozin propanediol (FARXIGA) 10 MG TABS tablet Take 10 mg by mouth.     gabapentin  (NEURONTIN ) 600 MG tablet TAKE 1 TABLET BY MOUTH TWICE  DAILY 200 tablet 2   levothyroxine  (SYNTHROID ) 150 MCG tablet Take 1 tablet (150 mcg total) by mouth daily.     Magnesium  400 MG CAPS Take  400 mg by mouth daily.     metoprolol  succinate (TOPROL  XL) 25 MG 24 hr tablet Take 1 1/2 tablet in the morning (37.5 mg) and take 1/2 (12.5mg ) tablet at night. 180 tablet 3   Multiple Vitamins-Minerals (CENTRUM SILVER PO) Take 1 tablet by mouth daily.     nitroGLYCERIN  (NITROSTAT ) 0.4 MG SL tablet Place 1 tablet (0.4 mg total) under the tongue every 5 (five) minutes as needed for chest pain. 25 tablet 3   omeprazole (PRILOSEC) 20 MG capsule Take 20 mg by mouth daily. May take a second 20 mg dose as needed for heartburn     potassium chloride  (KLOR-CON ) 20 MEQ packet Take 20 mEq by mouth 2 (two) times daily. 180 packet 3   torsemide  (DEMADEX ) 20 MG tablet Take 3 tablets (60 mg total) by mouth daily. 270 tablet 3   Vibegron (GEMTESA) 75 MG TABS Take 75 mg by  mouth daily.     Vitamin D , Ergocalciferol , (DRISDOL ) 1.25 MG (50000 UNIT) CAPS capsule Take 1 capsule (50,000 Units total) by mouth every 14 (fourteen) days. 6 capsule 3   [DISCONTINUED] Ranitidine HCl (ZANTAC 75 PO) Take by mouth.       No current facility-administered medications on file prior to visit.    Labs: No results found for: HGBA1C    Latest Ref Rng & Units 07/19/2024    3:30 PM 05/31/2024    2:46 PM 03/08/2024    3:26 PM  CMP  Glucose 70 - 99 mg/dL 873  78  84   BUN 8 - 23 mg/dL 17  18  20    Creatinine 0.44 - 1.00 mg/dL 8.80  8.79  8.60   Sodium 135 - 145 mmol/L 142  141  145   Potassium 3.5 - 5.1 mmol/L 3.2  3.7  4.7   Chloride 98 - 111 mmol/L 102  97  102   CO2 22 - 32 mmol/L 28  30  28    Calcium  8.9 - 10.3 mg/dL 8.4  8.3  8.7   Total Protein 6.5 - 8.1 g/dL 6.0  6.2    Total Bilirubin 0.0 - 1.2 mg/dL 0.3  0.5    Alkaline Phos 38 - 126 U/L 92  91    AST 15 - 41 U/L 45  42    ALT 0 - 44 U/L 28  30     Lipid Panel     Component Value Date/Time   CHOL 141 06/07/2020 0833   TRIG 71 06/07/2020 0833   HDL 57 06/07/2020 0833   CHOLHDL 2.5 06/07/2020 0833   VLDL 14 03/06/2020 0420   LDLCALC 69 06/07/2020 0833    Notable Signs/Symptoms:   Lifestyle & Dietary Hx LIves at home with her husband. Eats most meals at home.60 oz  Estimated daily fluid intake: 60 oz Supplements: MVI  Vit D Sleep: 7-8 hrs Stress / self-care:  Current average weekly physical activity: ADL  24-Hr Dietary Recall First Meal: 8 am Toast-wheat bread with butter and honey, coffee with cream Snack:  Second Meal: Baked potato- butter, salt and pepper,  sparkling water  Snack: wafer cookies 4, cup of hot tea-dandilion and nettle tea herbal Third Meal: 7 pm  Boiled cabbage, 1 salmon cakes, hydrowater Snack:  Beverages: water   Estimated Energy Needs Calories: 1200-1500 Carbohydrate: 135g Protein: 90g Fat: 33g   NUTRITION DIAGNOSIS  NB-1.1 Food and nutrition-related knowledge deficit  As related to Hypertension.  As evidenced by medication management of HTN, CVD with Lipitor, Metoprolol , Farxia,Torsemide .  NUTRITION INTERVENTION  Nutrition education (E-1) on the following topics:  Lifestyle Medicine  - Whole Food, Plant Predominant Nutrition is highly recommended: Eat Plenty of vegetables, Mushrooms, fruits, Legumes, Whole Grains, Nuts, seeds in lieu of processed meats, processed snacks/pastries red meat, poultry, eggs.    -It is better to avoid simple carbohydrates including: Cakes, Sweet Desserts, Ice Cream, Soda (diet and regular), Sweet Tea, Candies, Chips, Cookies, Store Bought Juices, Alcohol in Excess of  1-2 drinks a day, Lemonade,  Artificial Sweeteners, Doughnuts, Coffee Creamers, Sugar-free Products, etc, etc.  This is not a complete list.....  Exercise: If you are able: 30 -60 minutes a day ,4 days a week, or 150 minutes a week.  The longer the better.  Combine stretch, strength, and aerobic activities.  If you were told in the past that you have high risk for cardiovascular diseases, you may seek evaluation by your heart doctor prior to initiating moderate to intense exercise programs.   Handouts Provided Include  Lifestyle Medicine handouts  Learning Style & Readiness for Change Teaching method utilized: Visual & Auditory  Demonstrated degree of understanding via: Teach Back  Barriers to learning/adherence to lifestyle change: none  Goals Established by Pt  Drink more water  80 oz Cut back on fat and avoid beef and pork and stick to chicken, fish and turkey Less fried foods and more backed  Try to egg whites Get rid of salt shaker and use more herbs and spices Focus on whole plant based foods  Walk 10 minutes three times per day Lose 2-3 lbs per month   MONITORING & EVALUATION Dietary intake, weekly physical activity, and weight in 1 month.  Next Steps  Patient is to work on meal planning and focusing on whole plant based diet.SABRA

## 2024-08-09 NOTE — Patient Instructions (Addendum)
 Goals  Drink more water  80 oz Cut back on fat and avoid beef and pork and stick to chicken, fish and turkey Less fried foods and more backed  Try to egg whites Get rid of salt shaker and use more herbs and spices Focus on whole plant based foods  Walk 10 minutes three times per day Lose 2-3 lbs per month

## 2024-08-11 ENCOUNTER — Telehealth: Payer: Self-pay | Admitting: Cardiology

## 2024-08-11 DIAGNOSIS — G4733 Obstructive sleep apnea (adult) (pediatric): Secondary | ICD-10-CM

## 2024-08-11 DIAGNOSIS — I5032 Chronic diastolic (congestive) heart failure: Secondary | ICD-10-CM

## 2024-08-11 DIAGNOSIS — I10 Essential (primary) hypertension: Secondary | ICD-10-CM

## 2024-08-11 NOTE — Telephone Encounter (Signed)
 Patient is following-up on next steps to get her sleep equipment supplies.

## 2024-08-11 NOTE — Telephone Encounter (Addendum)
 All supplies ordered through [facility]  Upon patient request DME selection is [facility]. Patient understands he will be contacted by [facility] to set up his cpap. Patient understands to call if [facility] does not contact him with new setup in a timely manner. Patient understands they will be called once confirmation has been received from [facility]/ that they have received their new machine to schedule 10 week follow up appointment.   [facility] notified of new cpap order  Please add to airview Patient was grateful for the call and thanked me.

## 2024-08-15 NOTE — Progress Notes (Signed)
 MYELOMA AND PLASMA CELL DISORDER CLINIC [facility]  NEW PATIENT OFFICE VISIT  Name: Sara [name] MRN:  [id number] Date:  08/15/2024  PCP: No primary care provider on file.  Referring physician: Mickiel Dry, Md [address] [place],  [place] [address]  Thank you very much for referring Sara [name] for consultation for AL Amyloidosis.  Here is a summary of her clinic encounter.   DIAGNOSIS: 1. 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  Briefly, Sara [name] was found to have elevated lambda light chains in October 2024.  She was also found to have proteinuria in mid 2025.  She underwent kidney biopsy on 04/28/2024 which revealed lambda light chain AL amyloidosis with moderate interstitial fibrosis and tubular atrophy.  Laboratory assessment on May 31, 2024 showed an M spike of 0.7 comprised of IgA lambda.  Beta-2  microglobulin was 3.8.  Kappa light chains were 34 and lambda light chains were 86.9 with a involved uninvolved ratio of 2.5.  Urine light chain assessment showed kappa light chains at 92.42 with lambda light chains of 69.35 and immunofixation showed IgA lambda monoclonal gammopathy with Bence-Jones proteins positive.  Total protein was 5.5 grams per 24 hours.  PET scan obtained on 06/15/2024 showed no evidence of myeloma or evidence of breast cancer recurrence.  Bone marrow biopsy performed on June 29, 2024 showed 6% plasma cells by differential and 5% by CD138 staining.  Cytogenetics were normal and FISH was pending at the time of the evaluation.  Echocardiogram on 06/28/2024 showed EF of 55 to  60%.  She is accompanied by her husband.  She was recently diagnosed with AL amyloidosis, a condition she is not familiar with. Sara [name] has not yet discussed potential treatment options, opting to wait until after this consultation. She has noticed changes in her urine, describing it as bubbly and foamy. A kidney biopsy was performed by, Sara [name], a nephrologist, due to abnormal urine specimens. She is currently on torsemide , which she reports is effectively flushing her kidneys. She expresses reluctance towards chemotherapy as a treatment option. She has undergone a bone marrow biopsy by Sara [name]. She reports no issues with blood sugar levels. She has undergone a PET scan and echocardiogram but is uncertain about having had an MRI. She reports no implants in her heart. She has been experiencing leg swelling for over a year, which sometimes extends to her ankles. She is unsure about her family history of blood disorders or blood cancers.  She has been experiencing shortness of breath for the past year. She lives independently at home and does not frequently engage in strenuous activities. She manages her personal hygiene independently but finds showering exhausting, likening it to climbing 3 to 4 flights of stairs. This has been ongoing for over a year. She does not have any flights of stairs at home, just 1 or 2 steps to get into the house. She has difficulty with long-distance walking, which she attributes to her back and knee conditions. 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  PAST SURGICAL HISTORY: She has  a history of breast cancer surgery and partial nephrectomy due to kidney cancer. She has also had both knees replaced and back surgery within the last 10 years.      CURRENT STATUS:   REVIEW OF SYSTEMS: Constitutional: Denies fevers, chills, night sweats or weight loss Cardiac: Denies CP or  palpitations Pulmonary: Denies SOB or cough Abdominal: Denies NVD GU: Denies dysuria or urinary frequency Skin: Denies new rash or lesions    ECOG 1-2, Karnofsky Performance Scale: 70  PAST MEDICAL HISTORY: she has no past medical history on file.  PAST SURGICAL HISTORY:  has no past surgical history on file.  MEDICATIONS:  Current Rx ordered in [facility][1] I have documented the patient's current medications to the best of my ability.   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 Mucous membranes moist without oropharyngeal lesions. NECK: Supple without JVD or thyromegaly. LUNGS: Clear to auscultation without wheezes, crackles or rhonchi. Normal chest expansion.  CARDIAC: Regular rate and rhythm, S1, S2, no audible murmurs, rubs, or gallops. ABDOMEN: Soft, non distended, non tender, normoactive bowel sounds. No hepatosplenomegaly or other masses palpable. EXTREMITIES: No edema, clubbing or cyanosis. 2+ pulses distally. Musculoskeletal: No weakness. Range of motion intact.  NEUROLOGIC: Alert and oriented. Cranial nerves III-XII grossly symmetric and intact. SKIN: No petechiae, large ecchymoses, or rash noted. LYMPH: No cervical, supraclavicular, axillary or inguinal adenopathy palpable.  ECOG: 1-2  KPS: 70  LABORATORY DATA:(Personally reviewed by me) Recent Results (from the past week)  Bone Marrow, Referral Consult   Collection Time: 08/15/24 10:40 AM  Result Value Ref Range   Case Report      Pathology Consult Report                           Case: [id number]                              Authorizing Provider:  Dorn Dallas [name],   Collected:           08/15/2024 1040                                    MD                                                                          Ordering Location:     [facility]  Received:            08/15/2024 1041  [facility]                                                                            Laboratory                                                                  Pathologist:           Suzen Sheldon, MD                                                       Specimen:    Bone Marrow Aspirate, [phone]                                                      Final Diagnosis      Outside Case Consultation/Review; [facility] ([place]); [phone], Specimen Collection Date: 06/29/2024:  Bone marrow, aspirate, clot, and core biopsy: -Cellular bone marrow (35%) with trilineage hematopoiesis, mild lymphocytosis, and monotypic plasma cells (5-10% plasma cells by CD138 IHC analysis; lambda-restricted). (See comment)              Attestation Statement      I verify that I have personally reviewed all relevant slides/materials for this case and rendered or confirmed the diagnosis.    Comment      The special stain for Congo Red highlights focal amyloid deposition in blood vessels.   Per report, concurrent chromosome analysis performed at [facility] (accession #: [id number]) shows a normal female karyotype: 46,XX[20]. A plasma cell myeloma prognostic FISH panel (also performed at [facility]) was normal.   The differential diagnosis includes a plasma cell neoplasm with a reactive lymphocytosis versus low-grade B cell lymphoma with plasmacytic differentiation. Flow cytometric evaluation of a bone marrow sample and MYD88 mutation analysis is recommended.  The results of this case were  Epic-messaged to Dr. Arman on 08/15/2024 at 3:05 pm by Dr. Sheldon.     Microscopic Description      Cell Type Value Reference Range  Blast % 0 0-3  Promyelocyte % 0 1-8  Myelocyte % 3 13-22  Metamyelocyte % 0 13-22  Neutrophil % 18 7-30  Eosinophil % 1 1-6  Basophil % 0 0-2  Monocyte % 5 1-3  Erythrocyte % 33 14-30  Lymphocyte % 35 3-24  Plasma Cell % 5 1-3  Other Cells % 0   Total Counted 500   M/E Ratio 0.8 1.2-5.0  Megakaryocytes Present    Peripheral blood smear:  See CBC data.  Platelets: within normal limits. Red blood cells: mild anisocytosis. White blood cells: no blasts or plasma cells identified.   Bone marrow aspirate/Touch Preparation:  Quality: Hemodilute aspirate smears are received for evaluation. Few small cellular marrow particles are noted. Stripped cells are also present.   Granulopoiesis: Present; orderly maturation.  Erythropoiesis: Present; orderly maturation.  Megakaryopoeisis: Present.  Blasts: No increase in blasts.  Other: Few scattered small lymphocytes with round to slight irregular nuclear contours and mature nuclear chromatin are noted. A subset of the lymphocytes also exhibit a plasmacytoid morphology.   The touch preparation of the bone marrow biopsy shows predominantly blood with few cells, including rare scattered plasma cells.   Iron stain: (bone marrow aspirate and touch preparation slide) Non-contributory due to the absence of cellular marrow particles.   Bone Marrow Clot and Biopsy: Bone Marrow Clot Cellularity: 35% Bone Marrow Biopsy Cellularity: 35% Microscopic Description: Sections of clot and biopsy show a cellular marrow with trilineage hematopoiesis (and hemosiderin deposition). Scattered variably-sized megakaryocytes are noted. Scattered variably sized erythroid colonies show a spectrum of maturation. Scattered mature granulocytes are noted. No granulomas or lymphoid aggregates identified. The results of  immunohistochemistry performed on the clot biopsy specimen show:   CD138: Highlights an increased number of plasma cells (5-10% plasma cells on clot section; approximately 10% plasma cells on biopsy section).  Kappa/Lambda ISH: Shows a monotypic lambda staining pattern.   The special stain for iron on the clot specimen highlights rare storage iron. The special stain for Congo Red on the biopsy specimen highlights focal amyloid deposition in blood vessels.  The positive control slides stain appropriately.     Gross Description      A. Bone Marrow Aspirate. Received 25 slides labeled [id number] from [facility] with corresponding report belonging to patient Siguenza, Roniyah.  [facility] Department of Pathology [address] [place], [place] [address]      Clinical Information      Other -    Point of Service      [facility] [place], [place] [id number], [facility] [place], [place] [address]   Troponin, High Sensitive   Collection Time: 08/15/24 12:58 PM  Result Value Ref Range   Troponin, High Sensitive 8 <15 ng/L  B-Type Natriuretic Peptide (BNP)   Collection Time: 08/15/24 12:58 PM  Result Value Ref Range   B-Type Natriuretic Peptide (BNP) 68 <100 pg/mL  NT-proBNP   Collection Time: 08/15/24 12:58 PM  Result Value Ref Range   NT-proBNP 240 (H) <178 pg/mL  Immunoelectrophoresis (IFE), Random Urine   Collection Time: 08/15/24 12:58 PM  Result Value Ref Range   Protein, Total, Urine 179 (H) <10 mg/dL   Immunoelectrophoresis (IFE), Random Urine MONOCLONAL IGA LAMBDA.    Pathology Review Urine Immunoelectrophoresis      Electronically signed by Ozell Darryle Sarah, MD.   Immunoglobulins, Quantitative, IgA, IgG, IgM   Collection Time: 08/15/24 12:58 PM  Result Value Ref Range   Immunoglobulin A, Quantitative (IgA) 969 (H) 66 - 433 mg/dL   Immunoglobulin G, Quantitative (IgG) 446 (L) 635 - 1,741 mg/dL    Immunoglobulin M, Quantitative (IgM) 62 45 - 281 mg/dL  Comprehensive Metabolic Panel   Collection Time: 08/15/24 12:58 PM  Result Value Ref Range   Sodium 140 136 - 145 mmol/L   Potassium 3.7 3.4 - 4.5 mmol/L   Chloride 100 98 - 107 mmol/L   CO2 33 (H) 21 - 31 mmol/L   Anion Gap 7 6 - 14 mmol/L   Glucose, Random 78 70 - 99 mg/dL   Blood Urea  Nitrogen (BUN) 15 7 - 25 mg/dL  Creatinine 1.22 (H) 0.60 - 1.20 mg/dL   eGFR 46 (L) >40 fO/fpw/8.26f7   Albumin  2.8 (L) 3.5 - 5.7 g/dL   Total Protein 5.8 (L) 6.4 - 8.9 g/dL   Bilirubin, Total 0.4 0.3 - 1.0 mg/dL   Alkaline Phosphatase (ALP) 81 34 - 104 U/L   Aspartate Aminotransferase (AST) 40 (H) 13 - 39 U/L   Alanine Aminotransferase (ALT) 21 7 - 52 U/L   Calcium  8.2 (L) 8.6 - 10.3 mg/dL   BUN/Creatinine Ratio 12.3 10.0 - 20.0   Corrected Calcium  9.2 mg/dL  Immunoelectrophoresis (IFE), Serum   Collection Time: 08/15/24 12:58 PM  Result Value Ref Range   Immunoelectrophoresis (IFE), Serum MONOCLONAL IGA LAMBDA.    Pathology Review Serum Immunoelectrophoresis      Electronically signed by Ozell Darryle Sarah, MD.   Free Kappa And Lambda Light Chains with Ratio, Quantitative   Collection Time: 08/15/24 12:58 PM  Result Value Ref Range   Free Kappa Light Chains, Serum 29.79 (H) 3.30 - 19.40 mg/L   Free Lambda Light Chains, Serum 86.66 (H) 5.71 - 26.30 mg/L   Kappa/Lambda Ratio, Serum 0.34 0.26 - 1.65  Protein Electrophoresis, Serum   Collection Time: 08/15/24 12:58 PM  Result Value Ref Range   Total Protein 5.8 (L) 6.4 - 8.9 g/dL   Electrophoresis, Albumin  2.5 (L) 3.5 - 5.0 g/dL   Joeyj-8-Honalopw 0.3 0.1 - 0.4 g/dL   Joeyj-7-Honalopw 1.1 0.4 - 1.2 g/dL   Azuj-8-Honalopw 0.4 0.4 - 0.7 g/dL   Beta-2 -Globulin 1.0 (H) 0.1 - 0.4 g/dL   Gamma Globulin 0.5 0.5 - 1.6 g/dL   Protein Electrophoresis Interpretation      M SPIKE IN THE BETA TWO REGION, ESTIMATED AT 0.71 G/DL.   Pathology Review Serum Electrophoresis      Electronically  signed by Ozell Darryle Sarah, MD.   Protein Electrophoresis, Random Urine   Collection Time: 08/15/24 12:58 PM  Result Value Ref Range   Protein, Total, Urine 171 (H) <10 mg/dL   Albumin , Urine Electrophoresis 71 %   Alpha-1-Globulin, Urine Electrophoresis 12 %   Alpha-2-Globulin, Urine Electrophoresis 4 %   Beta Globulin, Urine Electrophoresis 9 %   Gamma Globulin, Urine Electrophoresis 5 %   Protein Electrophoresis, Urine Interpretation      M SPIKE IN THE BETA TWO REGION, ESTIMATED 3% OF TOTAL PROTEIN.   Pathology Review Urine Electrophoresis      Electronically signed by Ozell Darryle Sarah, MD.   Beta-2  Microglobulin   Collection Time: 08/15/24 12:58 PM  Result Value Ref Range   Beta-2  Microglobulin 5.3 (H) 0.8 - 2.3 mg/L  Lactate Dehydrogenase (LDH)   Collection Time: 08/15/24 12:58 PM  Result Value Ref Range   Lactate Dehydrogenase (LDH) 253 140 - 271 U/L  CBC with Differential   Collection Time: 08/15/24 12:58 PM  Result Value Ref Range   WBC 7.10 4.40 - 11.00 10*3/uL   RBC 4.46 4.10 - 5.10 10*6/uL   Hemoglobin 14.2 12.3 - 15.3 g/dL   Hematocrit 57.6 64.0 - 44.6 %   Mean Corpuscular Volume (MCV) 94.9 80.0 - 96.0 fL   Mean Corpuscular Hemoglobin (MCH) 31.8 27.5 - 33.2 pg   Mean Corpuscular Hemoglobin Conc (MCHC) 33.5 33.0 - 37.0 g/dL   Red Cell Distribution Width (RDW) 14.3 12.3 - 17.0 %   Platelet Count (PLT) 206 150 - 450 10*3/uL   Mean Platelet Volume (MPV) 8.4 6.8 - 10.2 fL   Neutrophils % 52 %   Lymphocytes % 36 %  Monocytes % 9 %   Eosinophils % 2 %   Basophils % 1 %   nRBC % 0 %   Neutrophils Absolute 3.70 1.80 - 7.80 10*3/uL   Lymphocytes # 2.50 1.00 - 4.80 10*3/uL   Monocytes # 0.60 0.00 - 0.80 10*3/uL   Eosinophils # 0.20 0.00 - 0.50 10*3/uL   Basophils # 0.00 0.00 - 0.20 10*3/uL   nRBC Absolute 0.00 <=0.00 10*3/uL    Myeloma Labs: Lab Results  Component Value Date   WBC 7.10 08/15/2024   HGB 14.2 08/15/2024   PLT 206 08/15/2024   BUN 15  08/15/2024   CREATININE 1.22 (H) 08/15/2024   CALCIUM  8.2 (L) 08/15/2024   Lab Results  Component Value Date   BILITOT 0.4 08/15/2024   AST 40 (H) 08/15/2024   ALT 21 08/15/2024   PROT 5.8 (L) 08/15/2024   PROT 5.8 (L) 08/15/2024   ALBUMIN  2.8 (L) 08/15/2024   LDH 253 08/15/2024      RADIOGRAPHIC DATA:(Personally reviewed by me)   PATHOLOGY DATA:    TREATMENT INTENT:   DISCUSSION:  Assessment & Plan In summary, Ms. Kiesel is a 78 year old female with past medical history of DCIS, RCC status post resection, hypertension, hypothyroidism who was referred to clinic by Dr. Davonna for recently diagnosed AL amyloid of the kidney.  We discussed completion of workup with cardiac MRI and cardiac markers and the serum.  Would recommend initiation of daratumumab with cyclophosphamide, Velcade and dexamethasone  per the [place] study.  We discussed that approximately 50% of patients had a decline in proteinuria with renal involvement and had durable responses.  We also discussed that there is no role for autologous stem cell transplant in this condition. There is a BMT CTN study evaluating ASCT in patients under age 16.   Treatment can be administered locally with Dr. Davonna and she can return to clinic every 3 months for repeat evaluation and guidance on continued therapy.  Monitoring 24-hour urine every other month is advised to document potential changes in proteinuria. Baseline proteinuria was 5.5grams in 24 hours. Shortness of breath, especially after activities such as showering, has been ongoing for the last year. This symptom may be related to AL amyloidosis. A cardiac MRI is recommended to rule out cardiac involvement. Follow up in 3 months.   AL amyloidosis - Complete workup with cardiac markers and a cardiac MRI to rule out cardiac involvement - Would start Dara-CyBorD chemotherapy (daratumumab, cyclophosphamide, bortezomib, dexamethasone ) per the [facility] protocol - I often  decrease velcade dosing frequency with omission of D22 treatment to decrease the risk of neuropathy, this was not discussed with Mrs. Castronovo but should be considered - I would also monitor 24-hour urine every other month to document potential changes in proteinuria - Blood work today for records  Shortness of breath - Cardiac MRI to rule out cardiac involvement  Follow-up - Follow up in 3 months  Follow Up Appointments which have been scheduled for you    Sep 18, 2024 11:00 AM (Arrive by 10:30 AM) MRI Cardiac W And WO Contrast with [facility] CARD MRI1 [name] - CARDIOVASCULAR RADIOLOGY MR [facility]) [facility] [place] [address] [phone]  Please check in at the Cardiovascular Imaging Center, located on the 1st Floor of [employer].PATIENT PREP:You will be required to change into a gown for your scan.Please leave jewelry and other valuables at home.Bring a valid picture ID.   Nov 07, 2024 2:30 PM  LAB with HEM ONC 03 LAB [facility] - CC 03 Hematology Oncology [facility]) [facility] [place] [address] [phone]  Please arrive 15 minutes prior to your scheduled appointment.      Nov 07, 2024 3:00 PM Office Visit with Sara Dallas Ehrlich, MD [facility] [place] - [place] 96 Hematology Oncology [facility]) [facility] [place] [address] [phone]  Please arrive 15 minutes prior to your scheduled visit.            Sara FORBES Ehrlich, MD 08/18/2024       [1] Meds Ordered in [facility]  Medication Sig Dispense Refill  . acetaminophen  (TYLENOL ) 500 mg tablet Take 650 mg by mouth as needed.    . aspirin  81 mg EC tablet Take 81 mg by mouth daily. swallow whole    . atorvastatin  (LIPITOR) 40 mg tablet Take 40 mg by mouth daily.    . dapagliflozin propanediol (FARXIGA) 10 mg  tab tablet Take 10 mg by mouth.    . diphenhydrAMINE  (BENADRYL ) 50 mg capsule Take 50 mg by mouth daily.    . ergocalciferol  (VITAMIN D2) 1,250 mcg (50,000 unit) capsule Take 50,000 Units by mouth.    . gabapentin  (NEURONTIN ) 600 mg tablet Take 600 mg by mouth 2 (two) times a day.    . Gemtesa 75 mg tab Take 75 mg by mouth daily.    . levothyroxine  (SYNTHROID ) 150 mcg tablet     . magnesium  aspart,citrate,oxide 400 mg magnesium  cap Take 400 mg by mouth daily.    . metoprolol  succinate (TOPROL  XL) 25 mg 24 hr tablet Take 1 1/2 tablet in the morning (37.5 mg) and take 1/2 (12.5mg ) tablet at night.    . moxifloxacin (VIGAMOX) 0.5 % ophthalmic solution Administer 1 drop into both eyes 3 (three) times a day.    . multivitamin with iron tab Take 1 tablet by mouth daily.    . nitroglycerin  (NITROSTAT ) 0.4 mg SL tablet Place 0.4 mg under the tongue every 5 (five) minutes as needed for chest pain.    SABRA omeprazole (PriLOSEC) 20 mg DR capsule     . ondansetron  (ZOFRAN ) 4 mg tablet Take 4 mg by mouth every 8 (eight) hours as needed for nausea or vomiting.    . potassium chloride  (KLOR-CON ) 20 mEq packet Take 20 mEq by mouth 2 (two) times a day.    . torsemide  (DEMADEX ) 20 mg tablet Take 60 mg by mouth daily.     No current Epic-ordered facility-administered medications on file.  [2] Allergies Allergen Reactions  . Propoxyphene Rash and Other (See Comments)    Hallucinations   Hallucinations  . Amlodipine      Lower extremity edema  . Bee Pollen Other (See Comments)    Sneezing, congestion  . Camphor Itching  . Latex Dermatitis and Rash  . Mite Extract Itching    Sneezing. Runny nose  . Mold Itching  . Tomato Hives

## 2024-08-23 ENCOUNTER — Inpatient Hospital Stay: Attending: Oncology | Admitting: Oncology

## 2024-08-23 VITALS — BP 113/67 | HR 56 | Temp 97.9°F | Resp 17 | Ht 66.0 in | Wt 220.0 lb

## 2024-08-23 DIAGNOSIS — C649 Malignant neoplasm of unspecified kidney, except renal pelvis: Secondary | ICD-10-CM | POA: Diagnosis not present

## 2024-08-23 DIAGNOSIS — E8581 Light chain (AL) amyloidosis: Secondary | ICD-10-CM | POA: Diagnosis not present

## 2024-08-23 DIAGNOSIS — D0511 Intraductal carcinoma in situ of right breast: Secondary | ICD-10-CM

## 2024-08-23 NOTE — Progress Notes (Signed)
 " Patient Care Team: [name], MD as PCP - General (Family Medicine) Burnard Debby LABOR, MD (Inactive) as PCP - Cardiology (Cardiology) Cindie Ole DASEN, MD as PCP - Electrophysiology (Cardiology) Shaaron Lamar HERO, MD (Gastroenterology) Delford Maude BROCKS, MD as Attending Physician (Cardiology) Davonna Siad, MD as Medical Oncologist (Medical Oncology) Celestia Joesph SQUIBB, RN as Oncology Nurse Navigator (Medical Oncology)  Clinic Day:  08/23/2024  Referring physician: [name], MD   CHIEF COMPLAINT:  CC: Renal amyloidosis (AL type)   ASSESSMENT & PLAN:   Assessment & Plan: Sara [name]  is a 78 y.o. female with newly diagnosed AL amyloidosis  Assessment and Plan Assessment & Plan Light chain (AL) amyloidosis Diagnosis confirmed by kidney biopsy showing AL amyloid deposits. PET scan: Negative for lytic bone lesions Cardiac echo: Normal LVEF Elevated LDH, beta-2  microglobulin Bone marrow biopsy: No evidence of myeloma, 5% plasma cells by cellularity Elevated free light chains with slightly elevated ratio and serum and normal ratio in urine.  Total urine protein greater than 5 g.  IFE: IgA monoclonal protein with lambda light chain specificity Evaluated by Dr.Lambird at [facility]. Recommended Dara-VCD with no role for autologous stem cell transplant for patients over 70 years   - Will initiate chemotherapy with daratumumab , Velcade , cyclophosphamide , and steroids. - Discussed risks Vs benefits in detail including side effects extensively. Dara-VCD (daratumumab , bortezomib /Velcade , cyclophosphamide , and dexamethasone ) commonly causes fatigue, low blood counts (raising infection and bleeding risk), nausea, constipation or diarrhea, and hair thinning. Other frequent effects include infusion-related reactions from daratumumab , nerve tingling or numbness from bortezomib , and mood changes, sleep problems, or high blood sugar from dexamethasone . Less common but  serious risks include infections, heart or lung issues, and liver problems. - Monitor urine protein levels every other month - Will do Velcade  weekly with omission of Day 22 dose for better tolerance and decreased incidence of peripheral neuropathy.  - Will arrange for chemotherapy education session. - Scheduled cardiac MRI on December 29th. - Will coordinate with cardiologist for further evaluation. Follow up scheduled.   Return to clinic with cycle 2 to assess for tolerance  Chest pain and shortness of breath Intermittent chest pain relieved by nitroglycerin . Shortness of breath noted. Cardiologist consultation pending at this time  - Patient is scheduled for cardiac MRI and has a follow up scheduled with cardiology.   Ductal carcinoma in situ (DCIS) of right breast Diagnosed in December 2015 due to abnormal screening mammogram S/p right simple mastectomy on 11/20/2014, lymph node biopsy negative.  Grade 2, 10 cm, ER: Positive, 100%, PR: positive, 90%. Completed 5 years of tamoxifen .  She stopped the tamoxifen  on 01/19/2020.   - Continue yearly mammograms   Renal cell carcinoma, unspecified laterality  She had a T1a Nx RCC which was treated with partial right nephrectomy (01/08/2016). Last CT scan on 10/01/2017 showed no recurrence.  Hepatic steatosis was seen. She had a repeat CT scan in January 2021 which was reportedly normal.   Completed 5 years of surveillance with Dr.Manny    The patient understands the plans discussed today and is in agreement with them.  She knows to contact our office if she develops concerns prior to her next appointment.  25 minutes of total time was spent for this patient encounter, including preparation,face-to-face counseling with the patient and coordination of care, physical exam, and documentation of the encounter.   Siad Davonna, MD  [facility] [place] - A DEPT OF [place].  [facility] [address]  [place] [address] Dept: [phone] Dept Fax: [phone]   Orders Placed This Encounter  Procedures   CBC with Differential    Standing Status:   Future    Number of Occurrences:   1    Expected Date:   09/05/2024    Expiration Date:   09/05/2025   Comprehensive metabolic panel    Standing Status:   Future    Number of Occurrences:   1    Expected Date:   09/05/2024    Expiration Date:   09/05/2025   CBC with Differential    Standing Status:   Future    Number of Occurrences:   1    Expected Date:   09/12/2024    Expiration Date:   09/12/2025   Comprehensive metabolic panel    Standing Status:   Future    Number of Occurrences:   1    Expected Date:   09/12/2024    Expiration Date:   09/12/2025   CBC with Differential    Standing Status:   Future    Number of Occurrences:   1    Expected Date:   09/19/2024    Expiration Date:   09/19/2025   Comprehensive metabolic panel    Standing Status:   Future    Number of Occurrences:   1    Expected Date:   09/19/2024    Expiration Date:   09/19/2025   CBC with Differential    Standing Status:   Future    Expected Date:   09/26/2024    Expiration Date:   09/26/2025   Comprehensive metabolic panel    Standing Status:   Future    Expected Date:   09/26/2024    Expiration Date:   09/26/2025   CBC with Differential    Standing Status:   Future    Expected Date:   10/03/2024    Expiration Date:   10/03/2025   Comprehensive metabolic panel    Standing Status:   Future    Expected Date:   10/03/2024    Expiration Date:   10/03/2025   CBC with Differential    Standing Status:   Future    Expected Date:   10/10/2024    Expiration Date:   10/10/2025   Comprehensive metabolic panel    Standing Status:   Future    Expected Date:   10/10/2024    Expiration Date:   10/10/2025   CBC with Differential    Standing Status:   Future    Expected Date:   10/17/2024    Expiration Date:   10/17/2025   Comprehensive metabolic panel     Standing Status:   Future    Expected Date:   10/17/2024    Expiration Date:   10/17/2025   CBC with Differential    Standing Status:   Future    Expected Date:   10/24/2024    Expiration Date:   10/24/2025   Comprehensive metabolic panel    Standing Status:   Future    Expected Date:   10/24/2024    Expiration Date:   10/24/2025     ONCOLOGY HISTORY:   Diagnosis: Renal amyloidosis   -07/12/2023: FKLC:25.3, FLLC:61.5, Ratio: 2.43. SPEP: M spike:0.7, IFE: not available -03/29/2024: Urine protein/creat ratio: 7385 -04/24/2024: Renal function panel: Creatinine 1.15. Calcium  8.5. Albumin  2.6.  -04/24/2024: Hepatitis B and Hepatitis C negative. HIV negative.  -04/28/2024: Kidney biopsy.  Pathology: Lambda light chain AL renal amyloidosis. Moderate intersitial fibrosis and tubular atrophy (IFTA 30-40%) with 30% obsolescent glomeruli (6/20 glomeruli).  -05/31/2024: SPEP: M spike: 0.7, IFE: IgA monoclonal protein with lambda light chain specificity, beta-2  microglobulin: 3.8, kappa free light chain: 34, lambda free light chain: 86.9, ratio: 2.5 -05/31/2024: Flow cytometry: No abnormal B or T cells identified - 06/02/2024: Urine free light chains: Kappa free light chains: 92.42, lambda free light chains: 69.35, ratio: 1.3, IFE: IgA monoclonal protein with lambda light chain specificity, Bence-Jones proteins positive.  Total protein: 5574 mg/24-hour -06/15/2024: NM PET Image Initial (PI) Whole body:  No evidence of multiple myeloma on whole-body FDG PET scan. No evidence of breast cancer recurrence. No renal abnormality PET-CT scan. -06/29/2024: Bone marrow biopsy:             -Mildly hypercellular bone marrow (50%) involved by plasma cell neoplasm (6% plasma cells by manual aspirate differential, approximately 5% by  CD138 immunohistochemical analysis, and lambda monotypic by kappa/lambda in situ hybridization).              -Cytogenetics: Normal female karyotype - Multiple myeloma FISH:  Normal -Negative for amyloid with congo red staining -06/28/2024: Echo: LVEF: 55-60% -08/15/2024: High sensitive troponin: 8- normal, NT-proBNP: 240 (elevated), elavated IgA, Normal IgM and low IgG.  -08/15/2024: Evaluated by Dr[facility]Lambird at [facility]. No role for autologous stem cell transplant (age >59).   Current Treatment:  Planned Dara-VCD  INTERVAL HISTORY:   Discussed the use of AI scribe software for clinical note transcription with the patient, who gave verbal consent to proceed.  History of Present Illness Sara [name] is a 78 year old female with AL amyloidosis who presents for discussion of chemotherapy treatment options.She is accompanied by her husband today.   She has been diagnosed with amyloidosis affecting her kidneys. Her bone marrow does not show the disease. She does not feel sick despite the diagnosis.  She is scheduled for a cardiac MRI on December 29th to monitor potential cardiac involvement due to amyloidosis. She has experienced shortness of breath and heart pain lasting ten to fifteen minutes, which was relieved by nitroglycerin . She has a cardiologist but has not seen her recently as she is out of town.  She is currently taking methotrexate and there is a concern about fatigue, as she is already feeling tired.  She has a history of proteinuria, which will be monitored regularly as part of her condition management.  She reports shortness of breath and heart pain, which was relieved by nitroglycerin . No current pain.   I have reviewed the past medical history, past surgical history, social history and family history with the patient and they are unchanged from previous note.  ALLERGIES:  is allergic to propoxyphene hcl, amlodipine , bee pollen, pollen extract, camphor, dust mite extract, latex, molds & smuts, and tomato.  MEDICATIONS:  Current Outpatient Medications  Medication Sig Dispense Refill   acetaminophen  (TYLENOL ) 500 MG tablet Take 650 mg by mouth as  needed for moderate pain or headache.     aspirin  EC 81 MG tablet Take 81 mg by mouth daily. Swallow whole.     atorvastatin  (LIPITOR) 40 MG tablet Take 40 mg by mouth daily.     dapagliflozin propanediol (FARXIGA) 10 MG TABS tablet Take 10 mg by mouth.     gabapentin  (NEURONTIN ) 600 MG tablet TAKE 1 TABLET BY MOUTH TWICE  DAILY 200 tablet 2   levothyroxine  (SYNTHROID ) 150 MCG tablet Take 1 tablet (150 mcg total)  by mouth daily.     Magnesium  400 MG CAPS Take 400 mg by mouth daily.     metoprolol  succinate (TOPROL  XL) 25 MG 24 hr tablet Take 1 1/2 tablet in the morning (37.5 mg) and take 1/2 (12.5mg ) tablet at night. 180 tablet 3   Multiple Vitamins-Minerals (CENTRUM SILVER PO) Take 1 tablet by mouth daily.     nitroGLYCERIN  (NITROSTAT ) 0.4 MG SL tablet Place 1 tablet (0.4 mg total) under the tongue every 5 (five) minutes as needed for chest pain. 25 tablet 3   omeprazole (PRILOSEC) 20 MG capsule Take 20 mg by mouth daily. May take a second 20 mg dose as needed for heartburn     potassium chloride  (KLOR-CON ) 20 MEQ packet Take 20 mEq by mouth 2 (two) times daily. 180 packet 3   torsemide  (DEMADEX ) 20 MG tablet Take 3 tablets (60 mg total) by mouth daily. 270 tablet 3   Vibegron (GEMTESA) 75 MG TABS Take 75 mg by mouth daily.     Vitamin D , Ergocalciferol , (DRISDOL ) 1.25 MG (50000 UNIT) CAPS capsule Take 1 capsule (50,000 Units total) by mouth every 14 (fourteen) days. 6 capsule 3   acyclovir  (ZOVIRAX ) 400 MG tablet Take 1 tablet (400 mg total) by mouth 2 (two) times daily. 60 tablet 5   cyclophosphamide  (CYTOXAN ) 50 MG capsule Take 13 capsules (650 mg total) by mouth once a week. Take with breakfast. 52 capsule 5   lidocaine -prilocaine  (EMLA ) cream Apply to affected area once 30 g 3   ondansetron  (ZOFRAN ) 8 MG tablet Take 8 mg by mouth 30 to 60 min prior to Cyclophosphamide  administration then take 8 mg every 8 hrs as needed for nausea and vomiting. 30 tablet 1   prochlorperazine  (COMPAZINE ) 10 MG  tablet Take 1 tablet (10 mg total) by mouth every 6 (six) hours as needed for nausea or vomiting. 30 tablet 1   No current facility-administered medications for this visit.   Facility-Administered Medications Ordered in Other Visits  Medication Dose Route Frequency Provider Last Rate Last Admin   0.9 %  sodium chloride  infusion   Intravenous Continuous Geofm Delon BRAVO, NP 40 mL/hr at 09/19/24 1245 500 mL at 09/19/24 1245     VITALS:  Blood pressure 113/67, pulse (!) 56, temperature 97.9 F (36.6 C), temperature source Tympanic, resp. rate 17, height 5' 6 (1.676 m), weight 220 lb (99.8 kg), SpO2 100%.  Wt Readings from Last 3 Encounters:  09/19/24 222 lb 3.2 oz (100.8 kg)  09/12/24 219 lb 3.2 oz (99.4 kg)  09/11/24 219 lb (99.3 kg)    Body mass index is 35.51 kg/m.  Performance status (ECOG): 1 - Symptomatic but completely ambulatory  PHYSICAL EXAM:   GENERAL:alert, no distress and comfortable SKIN: skin color, texture, turgor are normal, no rashes or significant lesions LYMPH:  no palpable lymphadenopathy in the cervical, axillary or inguinal LUNGS: clear to auscultation and percussion with normal breathing effort HEART: regular rate & rhythm and no murmurs and no lower extremity edema ABDOMEN:abdomen soft, non-tender and normal bowel sounds Musculoskeletal:no cyanosis of digits and no clubbing  NEURO: alert & oriented x 3 with fluent speech  LABORATORY DATA:  I have reviewed the data as listed   Latest Reference Range & Units 07/19/24 15:30  Kappa free light chain 3.3 - 19.4 mg/L 21.5 (H)  Lambda free light chains 5.7 - 26.3 mg/L 90.8 (H)  Kappa, lambda light chain ratio 0.26 - 1.65  0.24 (L)  Immunofixation Result, Serum  Comment ! (  C)  (H): Data is abnormally high (L): Data is abnormally low !: Data is abnormal (C): Corrected   Latest Reference Range & Units 07/20/24 07:19  ALBUMIN , U % 67.2  ALPHA 1 URINE % 8.8  Alpha 2, Urine % 5.5  % BETA, Urine % 16.1   Free Kappa Lt Chains,Ur 1.17 - 86.46 mg/L 101.80 (H)  Free Kappa/Lambda Ratio 1.83 - 14.26  1.15 (L) (C)  Free Lambda Lt Chains,Ur 0.27 - 15.21 mg/L 88.68 (H) (C)  GAMMA GLOBULIN URINE % 2.4  Immunofixation Result, Urine  Comment ! (C)  M-SPIKE %, Urine Not Observed % 8.4 (H) (C)  M-Spike, mg/24 hr Not Observed mg/24 hr 517 (H) (C)  Total Protein, Urine-Ur/day 30 - 150 mg/24 hr 6,152 (H)  Total Protein, Urine-UPE24 Not Estab. mg/dL 615.4  (H): Data is abnormally high (L): Data is abnormally low !: Data is abnormal (C): Corrected  RADIOGRAPHIC STUDIES: I have personally reviewed the radiological images as listed and agreed with the findings in the report.  "

## 2024-08-23 NOTE — Patient Instructions (Signed)
 [facility] at [facility] Discharge Instructions   You were seen and examined today by Dr. Davonna.  She discussed with you starting treatment for the amyloidosis you have with a combination of medications. Those medications are Cytoxan, which is an infusion, Velcade injection, and an injection called Darzalex. This is given in the clinic weekly.   We will arrange for you to have a chemotherapy education session.    Return as scheduled.    Thank you for choosing [facility] at [facility] to provide your oncology and hematology care.  To afford each patient quality time with our provider, please arrive at least 15 minutes before your scheduled appointment time.   If you have a lab appointment with the Cancer Center please come in thru the Main Entrance and check in at the main information desk.  You need to re-schedule your appointment should you arrive 10 or more minutes late.  We strive to give you quality time with our providers, and arriving late affects you and other patients whose appointments are after yours.  Also, if you no show three or more times for appointments you may be dismissed from the clinic at the providers discretion.     Again, thank you for choosing [facility]  Our hope is that these requests will decrease the amount of time that you wait before being seen by our physicians.       _____________________________________________________________  Should you have questions after your visit to [facility], please contact our office at [phone] and follow the prompts.  Our office hours are 8:00 a.m. and 4:30 p.m. Monday - Friday.  Please note that voicemails left after 4:00 p.m. may not be returned until the following business day.  We are closed weekends and major holidays.  You do have access to a nurse 24-7, just call the main number to the clinic [phone] and do not press any options, hold on  the line and a nurse will answer the phone.    For prescription refill requests, have your pharmacy contact our office and allow 72 hours.    Due to Covid, you will need to wear a mask upon entering the hospital. If you do not have a mask, a mask will be given to you at the Main Entrance upon arrival. For doctor visits, patients may have 1 support person age 88 or older with them. For treatment visits, patients can not have anyone with them due to social distancing guidelines and our immunocompromised population.

## 2024-08-23 NOTE — Progress Notes (Signed)
 START ON PATHWAY REGIMEN - Multiple Myeloma and Other Plasma Cell Dyscrasias     Cycles 1 and 2: A cycle is every 28 days:     Cyclophosphamide      Dexamethasone       Daratumumab and hyaluronidase-fihj      Bortezomib    Cycles 3 through 6: A cycle is every 28 days:     Cyclophosphamide      Dexamethasone       Dexamethasone       Daratumumab and hyaluronidase-fihj      Bortezomib    Cycles 7 and beyond (up to 2 years): A cycle is every 28 days:     Daratumumab and hyaluronidase-fihj   **Always confirm dose/schedule in your pharmacy ordering system**  Patient Characteristics: Primary AL Amyloidosis, First Line, Transplant Ineligible or Deferred Disease Classification: Primary AL Amyloidosis Line of therapy: First Line Is Patient Eligible for Transplant<= Transplant Ineligible or Deferred Intent of Therapy: Non-Curative / Palliative Intent, Discussed with Patient

## 2024-08-24 ENCOUNTER — Other Ambulatory Visit: Payer: Self-pay

## 2024-08-25 ENCOUNTER — Encounter: Payer: Self-pay | Admitting: Oncology

## 2024-08-25 ENCOUNTER — Other Ambulatory Visit: Payer: Self-pay

## 2024-08-25 MED ORDER — PROCHLORPERAZINE MALEATE 10 MG PO TABS: 10.0000 mg | ORAL_TABLET | Freq: Four times a day (QID) | ORAL | 1 refills | Status: DC | PRN

## 2024-08-25 MED ORDER — ACYCLOVIR 400 MG PO TABS: 400.0000 mg | ORAL_TABLET | Freq: Two times a day (BID) | ORAL | 5 refills | Status: AC

## 2024-08-25 MED ORDER — CYCLOPHOSPHAMIDE 50 MG PO CAPS: 300.0000 mg/m2 | ORAL_CAPSULE | ORAL | 5 refills | Status: DC

## 2024-08-25 MED ORDER — LIDOCAINE-PRILOCAINE 2.5-2.5 % EX CREA: TOPICAL_CREAM | CUTANEOUS | 3 refills | Status: AC

## 2024-08-25 MED ORDER — ONDANSETRON HCL 8 MG PO TABS: ORAL_TABLET | ORAL | 1 refills | Status: DC

## 2024-08-25 NOTE — Patient Instructions (Addendum)
 [facility] Chemotherapy Teaching You have been diagnosed with Amyloidosis by your oncologist.  You well receive the treatment Velcade and Darzalex weekly as prescribed and discussed on your visit with the oncologist.  You will also be taking the drug Cytoxan  Pills sent to your pharmacy.  The goal of treatment is palliative meaning to control your symptoms and prevent the further spread.  You will see the doctor regularly throughout treatment.  We will obtain blood work from you prior to every treatment and monitor your results to make sure it is safe to give your treatment. The doctor monitors your response to treatment by the way you are feeling, your blood work, and by obtaining scans periodically.  There will be wait times while you are here for treatment.  It will take about 30 minutes to 1 hour for your lab work to result.  Then there will be wait times while pharmacy mixes your medications.    Medications you will receive in the clinic prior to your chemotherapy medications:    Tylenol : to help with any discomfort you may be having and any pain with the injections.   Singulair: helps to prevent an allergic reaction to the injections.   Dexamethasone :  This is a steroid given prior to chemotherapy to help prevent allergic reactions; it may also help prevent and control nausea and diarrhea.    Benadryl :  This is a histamine blocker (different from the Pepcid) that helps prevent allergic/infusion reactions to your chemotherapy. This medication may cause dizziness/drowsiness.    Daratumumab (Darzalex Faspro)  About This Drug Daratumumab is used to treat cancer. It is given under the skin in your abdomen (subcutaneously).  Possible Side Effects   You may have a reaction to the drug. Sometimes you may be given medication to stop or lessen these side effects. Your nurse will check you closely for these signs: fever or shaking chills, flushing, facial swelling, feeling dizzy,  headache, trouble breathing, rash, itching, chest tightness, or chest pain. These reactions may happen after your infusion. If this happens, call 911 for emergency care.   Decrease in the number of white blood cells and platelets. This may raise your risk of infection, and raise your risk of bleeding.   Fever and chills   Tiredness   Feeling dizzy   Trouble sleeping   Cough and trouble breathing   Upper respiratory infection   Nausea and throwing up (vomiting)   Loose bowel movements (diarrhea)   Constipation (not able to move bowels)   Muscle spasms   Pain in the joints   Back pain   Swelling of your legs, ankles and/or feet   Effects on the nerves are called peripheral neuropathy. You may feel numbness, tingling, or pain in your hands and feet. It may be hard for you to button your clothes, open jars, or walk as usual. The effect on the nerves may get worse with more doses of the drug. These effects get better in some people after the drug is stopped but it does not get better in all people.  Note: Each of the side effects above was reported in 20% or greater of patients treated with daratumumab. Not all possible side effects are included above.  Warnings and Precautions   Severe decrease in the number of white blood cells and platelets    Severe allergic reaction   This medication can affect the results of blood tests that match your blood type. Your blood type will be tested  before treatment. Be sure to tell all healthcare providers you are taking this medicine before receiving blood transfusions, even for 6 months after your last dose.  Important Information   This drug may be present in the saliva, tears, sweat, urine, stool, vomit, semen, and vaginal secretions. Talk to your doctor and/or your nurse about the necessary precautions to take during this time.  Treating Side Effects   Drink plenty of fluids (a minimum of eight glasses per day is recommended).   If  you throw up or have loose bowel movements, you should drink more fluids so that you do not become dehydrated (lack of water  in the body from losing too much fluid).   To help with nausea and vomiting, eat small, frequent meals instead of three large meals a day. Choose foods and drinks that are at room temperature. Ask your nurse or doctor about other helpful tips and medicine that is available to help stop or lessen these symptoms.   If you have diarrhea, eat low-fiber foods that are high in protein and calories and avoid foods that can irritate your digestive tracts or lead to cramping.   Ask your nurse or doctor about medicine that can lessen or stop your diarrhea or constipation.   If you are not able to move your bowels, check with your doctor or nurse before you use enemas, laxatives, or suppositories.   Manage tiredness by pacing your activities for the day.   Be sure to include periods of rest between energy-draining activities.   To decrease the risk of infection, wash your hands regularly.   Avoid close contact with people who have a cold, the flu, or other infections.   Take your temperature as your doctor or nurse tells you, and whenever you feel like you may have a fever.   To help decrease the risk of bleeding, use a soft toothbrush. Check with your nurse before using dental floss.   Be very careful when using knives or tools.   Use an electric shaver instead of a razor.   Keeping your pain under control is important to your well-being. Please tell your doctor or nurse if you are experiencing pain.   If you are dizzy, get up slowly after sitting or lying.   If you are having trouble sleeping, talk to your nurse or doctor on tips to help you sleep better.   If you have numbness and tingling in your hands and feet, be careful when cooking, walking, and handling sharp objects and hot liquids.   Infusion reactions may rarely occur after your infusion. If this happens,  call 911 for emergency care.  Food and Drug Interactions   There are no known interactions of daratumumab with food.   This drug may interact with other medicines. Tell your doctor and pharmacist about all the prescription and over-the-counter medicines and dietary supplements (vitamins, minerals, herbs and others) that you are taking at this time. Also, check with your doctor or pharmacist before starting any new prescription or over-the-counter medicines, or dietary supplements to make sure that there are no interactions.  When to Call the Doctor  Call your doctor or nurse if you have any of these symptoms and/or any new or unusual symptoms:   Fever of 100.4 F (38 C) or higher   Chills   Pain in your chest   Coughing up yellow, green, or bloody mucus.   Wheezing or trouble breathing   Tiredness that interferes with your daily activities  Trouble falling or staying asleep   Feeling dizzy or lightheaded   Easy bleeding or bruising   Nausea that stops you from eating or drinking and/or is not relieved by prescribed medicines   Vomiting   No bowel movement in 3 days or when you feel uncomfortable.   Loose bowel movements (diarrhea) 4 times a day or loose bowel movements with lack of strength or a feeling of being dizzy   Weight gain of 5 pounds in one week (fluid retention)   Swelling of your legs, ankles and/or feet   Pain that does not go away, or is not relieved by prescribed medicines   Signs of infusion reaction: fever or shaking chills, flushing, facial swelling, feeling dizzy, headache, trouble breathing, rash, itching, chest tightness, or chest pain. If this happens, call 911 for emergency care.   Numbness, tingling, or pain in your hands and feet   If you think you may be pregnant or may have impregnated your partner  Reproduction Warnings   Pregnancy warning: This drug may have harmful effects on the unborn baby. Women of childbearing potential should  use effective methods of birth control during your cancer treatment and for at least 3 months after treatment. Let your doctor know right away if you think you may be pregnant.   Breastfeeding warning: It is not known if this drug passes into breast milk. For this reason, women should talk to their doctor about the risks and benefits of breastfeeding during treatment with this drug because this drug may enter the breast milk and cause harm to a breastfeeding baby.   Fertility warning: Human fertility studies have not been done with this drug. Talk with your doctor or nurse if you plan to have children. Ask for information on sperm or egg banking.   Bortezomib (Velcade)  About This Drug  Bortezomib is used to treat cancer. It is given in the vein (IV) or by a shot under the skin (subcutaneously).  You will receive this injection under your skin.  Possible Side Effects   Bone marrow suppression. Decrease in the number of white blood cells, red blood cells, and platelets. This may raise your risk of infection, make you tired and weak (fatigue), and raise your risk of bleeding.   Nausea and vomiting (throwing up)   Constipation (not able to move bowels)   Diarrhea (loose bowel movements)   Fever   Tiredness   Decreased appetite (decreased hunger)   Effects on the nerves are called peripheral neuropathy. You may feel numbness, tingling, or pain in your hands and feet. It may be hard for you to button your clothes, open jars, or walk as usual. The effect on the nerves may get worse with more doses of the drug. These effects get better in some people after the drug is stopped but it does not get better in all people.   Rash  Note: Each of the side effects above was reported in 20% or greater of patients treated with bortezomib. Not all possible side effects are included above.  Warnings and Precautions   Severe peripheral neuropathy   Low blood pressure   Congestive heart failure  - your heart has less ability to pump blood properly.   Trouble breathing because of fluid build-up and/or inflammation in your lungs   Nausea, vomiting, diarrhea and constipation which sometimes requires treatment to help lessen these side effects. There is also an increased risk of developing a partial or complete blockage of your small  and/or large intestine.   Changes in your central nervous system can happen. The central nervous system is made up of your brain and spinal cord. You could feel extreme tiredness, agitation, confusion, have hallucinations (see or hear things that are not there), trouble understanding or speaking, loss of control of your bowels or bladder, eyesight changes, numbness or lack of strength to your arms, legs, face, or body, seizures or coma. If you start to have any of these symptoms let your doctor know right away.   Tumor lysis syndrome: This drug may act on the cancer cells very quickly. This may affect how your kidneys work.   Changes in your liver function Increased risk of a syndrome that affects your red blood cells, platelets and blood vessels in your kidneys, which can cause kidney failure and be life-threatening.  Important Information   This drug may be present in the saliva, tears, sweat, urine, stool, vomit, semen, and vaginal secretions. Talk to your doctor and/or your nurse about the necessary precautions to take during this time.   This drug may impair your ability to drive or use machinery. Use caution and tell your nurse or doctor if you feel dizzy, very sleepy, and/or experience low blood pressure.  Treating Side Effects   Manage tiredness by pacing your activities for the day.   Be sure to include periods of rest between energy-draining activities.   To decrease the risk of infection, wash your hands regularly.   Avoid close contact with people who have a cold, the flu, or other infections.   Take your temperature as your doctor or  nurse tells you, and whenever you feel like you may have a fever.   To help decrease the risk of bleeding, use a soft toothbrush. Check with your nurse before using dental floss.   Be very careful when using knives or tools.   Use an electric shaver instead of a razor.   Ask your doctor or nurse about medicines that are available to help stop or lessen constipation.   If you are not able to move your bowels, check with your doctor or nurse before you use enemas, laxatives, or suppositories.   Drink plenty of fluids (a minimum of eight glasses per day is recommended).   If you throw up or have loose bowel movements, you should drink more fluids so that you do not become dehydrated (lack of water  in the body from losing too much fluid).   If you have diarrhea, eat low-fiber foods that are high in protein and calories and avoid foods that can irritate your digestive tracts or lead to cramping.   Ask your nurse or doctor about medicine that can lessen or stop your diarrhea.   To help with nausea and vomiting, eat small, frequent meals instead of three large meals a day. Choose foods and drinks that are at room temperature. Ask your nurse or doctor about other helpful tips and medicine that is available to help stop or lessen these symptoms.   To help with decreased appetite, eat foods high in calories and protein, such as meat, poultry, fish, dry beans, tofu, eggs, nuts, milk, yogurt, cheese, ice cream, pudding, and nutritional supplements.   Consider using sauces and spices to increase taste. Daily exercise, with your doctor's approval, may increase your appetite.   If you have numbness and tingling in your hands and feet, be careful when cooking, walking, and handling sharp objects and hot liquids.   If you get a  rash do not put anything on it unless your doctor or nurse says you may. Keep the area around the rash clean and dry. Ask your doctor for medicine if your rash bothers you.  Food  and Drug Interactions   This drug may interact with grapefruit and grapefruit juice. Talk to your doctor as this could make side effects worse.   Check with your doctor or pharmacist about all other prescription medicines and over-the-counter medicines and dietary supplements (vitamins, minerals, herbs and others) you are taking before starting this medicine as there are known drug interactions with bortezomib. Also, check with your doctor or pharmacist before starting any new prescription or over-the-counter medicines, or dietary supplements to make sure that there are no interactions.   Avoid the use of St. John's Wort with bortezomib as this may lower the levels of the drug in your body, which can make it less effective.  When to Call the Doctor  Call your doctor or nurse if you have any of these symptoms and/or any new or unusual symptoms:   Fever of 100.4 F (38 C) or higher   Chills   Tiredness that interferes with your daily activities   Feeling dizzy or lightheaded   Feeling that your heart is beating in a fast or not normal way (palpitations)   Cough   Wheezing or trouble breathing   Easy bleeding or bruising   Confusion and/or agitation   Hallucinations   Trouble understanding or speaking   Blurry vision or changes in your eyesight   Numbness or lack of strength to your arms, legs, face, or body   Symptoms of a seizure such as confusion, blacking out, passing out, loss of hearing or vision, blurred vision, unusual smells or tastes (such as burning rubber), trouble talking, tremors or shaking in parts or all of the body, repeated body movements, tense muscles that do not relax, and loss of control of urine and bowels. If you or your family member suspects you are having a seizure, call 911 right away.   Nausea that stops you from eating or drinking and/or is not relieved by prescribed medicines   Throwing up    Lasting loss of appetite or rapid weight loss of  five pounds in a week   No bowel movement in 3 days or when you feel uncomfortable.   Abdominal pain that does not go away   Diarrhea, 4 times in one day or diarrhea with lack of strength or a feeling of being dizzy   Numbness, tingling, or pain your hands and feet   Swelling of legs, ankles, and/or feet   Weight gain of 5 pounds in one week (fluid retention)   Decreased urine, or very dark urine   New rash and/or itching   Rash that is not relieved by prescribed medicines   Signs of tumor lysis: Confusion or agitation, decreased urine, nausea/vomiting, diarrhea, muscle cramping, numbness and/or tingling, seizures.   Signs of possible liver problems: dark urine, pale bowel movements, bad stomach pain, feeling very tired and weak, unusual itching, or yellowing of the eyes or skin   If you think you are pregnant or may have impregnated your partner  Reproduction Warnings   Pregnancy warning: This drug can have harmful effects on the unborn baby. Women of child bearing potential should use effective methods of birth control during your cancer treatment and for at least 7 months after treatment. Men with female partners of childbearing potential should use effective methods of  birth control during your cancer treatment and for at least 4 months after your cancer treatment. Let your doctor know right away if you think you may be pregnant or may have impregnated your partner.   Breastfeeding warning: Women should not breastfeed during treatment and for 2 months month after treatment because this drug could enter the breast milk and cause harm to a breastfeeding baby.   Fertility warning: In men and women both, this drug may affect your ability to have children in the future. Talk with your doctor or nurse if you plan to have children. Ask for information on sperm or egg banking.    Dexamethasone  (Decadron )  About This Drug  Dexamethasone  is used to treat cancer, to decrease  inflammation and sometimes used before and after chemotherapy to prevent or treat nausea and/or vomiting. It is given in the vein (IV) or orally (by mouth).  Possible Side Effects   Headache   High blood pressure   Abnormal heart beat   Tiredness and weakness   Changes in mood, which may include depression or a feeling of extreme well-being   Trouble sleeping   Increased sweating   Increased appetite (increased hunger)   Weight gain   Increase risk of infections   Pain in your abdomen   Nausea   Skin changes such as rash, dryness, redness   Blood sugar levels may change   Electrolyte changes   Swelling of your legs, ankles and/or feet   Changes in your liver function   You may be at risk for cataracts, glaucoma or infections of the eye   Muscle loss and / or weakness (lack of muscle strength)   Increased risk of developing osteoporosis- your bones may become weak and brittle  Note: Not all possible side effects are included above.  Warnings and Precautions   This drug may cause you to feel irritable, nervous or restless.   Allergic reactions, including anaphylaxis are rare but may happen in some patients. Signs of allergic reaction to this drug may be swelling of the face, feeling like your tongue or throat are swelling, trouble breathing, rash, itching, fever, chills, feeling dizzy, and/or feeling that your heart is beating in a fast or not normal way. If this happens, do not take another dose of this drug. You should get urgent medical treatment.   High blood pressure and changes in electrolytes, which can cause fluid build-up around your heart, lungs or elsewhere.   Increased risk of developing a hole in your stomach, small, and/or large intestine if you have ulcers in the lining of your stomach and/or intestine, or have diverticulitis, ulcerative colitis and/or other diseases that affect the gastrointestinal tract.   Effects on the endocrine glands  including the pituitary, adrenals or thyroid  during or after use of this medication.   Changes in the tissue of the heart, that can cause your heart to have less ability to pump blood. You may be short of breath or our arms, hands, legs and feet may swell.   Increased risk of heart attack.   Severe depression and other psychiatric disorders such as mood changes.   Burning, pain and itching around your anus may happen when this drug is given in the vein too rapidly (IV). It usually happens suddenly and resolves in less than 1 minute.  Important Information   Talk to your doctor or your nurse before stopping this medication, it should be stopped gradually. Depending on the dose and length of treatment, you  could experience serious side effects if stopped abruptly (suddenly).   Talk to your doctor before receiving any vaccinations during your treatment. Some vaccinations are not recommended while receiving dexamethasone .  How to Take Your Medication   For Oral (by mouth): You can take the medicine with or without food. If you have nausea or upset stomach, take it with food.   Missed dose: If you miss a dose, do not take 2 doses at the same time or extra doses.  If you vomit a dose, take your next dose at the regular time. Do not take 2 doses at the same time   Handling: Wash your hands after handling your medicine, your caretakers should not handle your medicine with bare hands and should wear latex gloves.   Storage: Store this medicine in the original container at room temperature. Protect from moisture and light. Discuss with your nurse or your doctor how to dispose of unused medicine.   Treating Side Effects   Drink plenty of fluids (a minimum of eight glasses per day is recommended).   To help with nausea and vomiting, eat small, frequent meals instead of three large meals a day. Choose foods and drinks that are at room temperature. Ask your nurse or doctor about other helpful tips  and medicine that is available to help stop or lessen these symptoms.   If you throw up, you should drink more fluids so that you do not become dehydrated (lack of water  in the body from losing too much fluid).   Manage tiredness by pacing your activities for the day.   Be sure to include periods of rest between energy-draining activities.   To help with muscle weakness, get regular exercise. If you feel too tired to exercise vigorously, try taking a short walk.   If you are having trouble sleeping, talk to your nurse or doctor on tips to help you sleep better.   If you are feeling depressed, talk to your nurse or doctor about it.   Keeping your pain under control is important to your well-being. Please tell your doctor or nurse if you are experiencing pain.   If you have diabetes, keep good control of your blood sugar level. Tell your nurse or your doctor if your glucose levels are higher or lower than normal.   To decrease the risk of infection, wash your hands regularly.   Avoid close contact with people who have a cold, the flu, or other infections.   Take your temperature as your doctor or nurse tells you, and whenever you feel like you may have a fever.   If you get a rash do not put anything on it unless your doctor or nurse says you may. Keep the area around the rash clean and dry. Ask your doctor for medicine if your rash bothers you.   Moisturize your skin several times day.   Avoid sun exposure and apply sunscreen routinely when outdoors.  Food and Drug Interactions   There are no known interactions of dexamethasone  with food.   Check with your doctor or pharmacist about all other prescription medicines and over-the-counter medicines and dietary supplements (vitamins, minerals, herbs and others) you are taking before starting this medicine as there are known drug interactions with dexamethasone . Also, check with your doctor or pharmacist before starting any new  prescription or over-the-counter medicines, or dietary supplement to make sure that there are no interactions.   There are known interactions of dexamethasone  with other medicines and products  like acetaminophen , aspirin , and ibuprofen . Ask your doctor what over-the-counter (OTC) medicines you can take.  When to Call the Doctor  Call your doctor or nurse if you have any of these symptoms and/or any new or unusual symptoms:   Fever of 100.4 F (38 C) or higher   Chills   A headache that does not go away   Trouble breathing   Blurry vision or other changes in eyesight   Feel irritable, nervous or restless   Trouble falling or staying asleep   Severe mood changes such as depression or unusual thoughts and/or behaviors   Thoughts of hurting yourself or others, and suicide   Tiredness that interferes with your daily activities   Feeling that your heart is beating in a fast, slow or not normal way   Feeling dizzy or lightheaded   Chest pain or symptoms of a heart attack. Most heart attacks involve pain in the center of the chest that lasts more than a few minutes. The pain may go away and come back, or it can be constant. It can feel like pressure, squeezing, fullness, or pain. Sometimes pain is felt in one or both arms, the back, neck, jaw, or stomach. If any of these symptoms last 2 minutes, call 911.   Heartburn or indigestion   Nausea that stops you from eating or drinking and/or is not relieved by prescribed medicines   Throwing up more than 3 times a day   Pain in your abdomen that does not go away   Abnormal blood sugar   Unusual thirst, passing urine often, headache, sweating, shakiness, irritability   Swelling of legs, ankles, or feet   Weight gain of 5 pounds in one week (fluid retention)   Signs of possible liver problems: dark urine, pale bowel movements, bad stomach pain, feeling very tired and weak, unusual itching, or yellowing of the eyes or skin    Severe muscle weakness   A new rash or a rash that is not relieved by prescribed medicines   Signs of allergic reaction: swelling of the face, feeling like your tongue or throat are swelling, trouble breathing, rash, itching, fever, chills, feeling dizzy, and/or feeling that your heart is beating in a fast or not normal way. If this happens, call 911 for emergency care.   If you think you may be pregnant    Cyclophosphamide  Capsules or Tablets What is this medication?  CYCLOPHOSPHAMIDE  (sye kloe FOSS fa mide) treats some types of cancer. It works by slowing down the growth of cancer cells. It may also be used to treat nephrotic syndrome, a type of kidney disease. It works by slowing down an overactive immune system. This medicine may be used for other purposes; ask your health care provider or pharmacist if you have questions.  COMMON BRAND NAME(S): Cytoxan  What should I tell my care team before I take this medication? They need to know if you have any of these conditions: Heart disease Irregular heartbeat or rhythm Infection Kidney problems Liver disease Low blood cell levels (white cells, platelets, or red blood cells) Lung disease Previous radiation Trouble when passing urine An unusual or allergic reaction to cyclophosphamide , other medications, foods, dyes, or preservatives Pregnant or trying to get pregnant Breast-feeding  How should I use this medication?  Take this medication by mouth with water . Take it as directed on the prescription label at the same time every morning. Do not cut, crush, or chew this medication. Swallow the tablets whole. Keep taking  it unless your care team tells you to stop. There may be unused or extra doses in the bottle after you finish the dosing cycle. Talk to your care team if you have questions about your dose. Handling this medication may be harmful. Wear gloves while touching the medication or bottle. Talk to your care team about how to  handle this medication. Special instructions may apply. Talk to your care team about the use of this medication in children. Special care may be needed. Overdosage: If you think you have taken too much of this medicine contact a poison control center or emergency room at once.  NOTE: This medicine is only for you. Do not share this medicine with others. What if I miss a dose? If you miss a dose, take it as soon as you can. If it is almost time for your next dose, take only that dose. Do not take double or extra doses.  What may interact with this medication? Amiodarone Amphotericin B Azathioprine Certain antivirals for HIV or hepatitis Certain medicines for blood pressure, such as enalapril, lisinopril , quinapril Cyclosporine Diuretics Etanercept Indomethacin Medications that relax muscles Metronidazole Natalizumab Tamoxifen  Warfarin  This list may not describe all possible interactions. Give your health care provider a list of all the medicines, herbs, non-prescription drugs, or dietary supplements you use. Also tell them if you smoke, drink alcohol, or use illegal drugs. Some items may interact with your medicine.  What should I watch for while using this medication?  This medication may make you feel generally unwell. This is not uncommon as chemotherapy can affect healthy cells as well as cancer cells. Report any side effects. Continue your course of treatment even though you feel ill unless your care team tells you to stop.  This medication may increase your risk of getting an infection. Call your care team for advice if you get a fever, chills, sore throat, or other symptoms of a cold or flu. Do not treat yourself. Try to avoid being around people who are sick.  Avoid taking medications that contain aspirin , acetaminophen , ibuprofen , naproxen, or ketoprofen unless instructed by your care team. These medications may hide a fever.  Be careful brushing or flossing your teeth or  using a toothpick because you may get an infection or bleed more easily. If you have any dental work done, tell your dentist you are receiving this information.  Drink water  or other fluids as directed. Urinate often, even at night.  Talk to your care team about your risk of cancer. You may be more at risk for certain types of cancer if you take this medication.  What side effects may I notice from receiving this medication? Side effects that you should report to your care team as soon as possible: Allergic reactions--skin rash, itching, hives, swelling of the face, lips, tongue, or throat Dry cough, shortness of breath or trouble breathing Heart failure--shortness of breath, swelling of the ankles, feet, or hands, sudden weight gain, unusual weakness or fatigue Heart muscle inflammation--unusual weakness or fatigue, shortness of breath, chest pain, fast or irregular heartbeat, dizziness, swelling of the ankles, feet, or hands Heart rhythm changes--fast or irregular heartbeat, dizziness, feeling faint or lightheaded, chest pain, trouble breathing Infection--fever, chills, cough, sore throat, wounds that don't heal, pain or trouble when passing urine, general feeling of discomfort or being unwell Kidney injury--decrease in the amount of urine, swelling of the ankles, hands, or feet Liver injury--right upper belly pain, loss of appetite, nausea, light-colored stool, dark  yellow or brown urine, yellowing skin or eyes, unusual weakness or fatigue Low red blood cell level--unusual weakness or fatigue, dizziness, headache, trouble breathing Low sodium level--muscle weakness, fatigue, dizziness, headache, confusion Red or dark brown urine Unusual bruising or bleeding Side effects that usually do not require medical attention (report to your care team if they continue or are bothersome): Hair loss Irregular menstrual cycles or spotting Loss of appetite Nausea Pain, redness, or swelling with sores  inside the mouth or throat Vomiting    SELF CARE ACTIVITIES WHILE ON CHEMOTHERAPY/IMMUNOTHERAPY:  Hydration Increase your fluid intake 48 hours prior to treatment and drink at least 8 to 12 cups (64 ounces) of water /decaffeinated beverages per day after treatment. You can still have your cup of coffee or soda but these beverages do not count as part of your 8 to 12 cups that you need to drink daily. No alcohol intake.  Medications Continue taking your normal prescription medication as prescribed.  If you start any new herbal or new supplements please let us  know first to make sure it is safe.  Mouth Care Have teeth cleaned professionally before starting treatment. Keep dentures and partial plates clean. Use soft toothbrush and do not use mouthwashes that contain alcohol. Biotene is a good mouthwash that is available at most pharmacies or may be ordered by calling [phone]. Use warm salt water  gargles (1 teaspoon salt per 1 quart warm water ) before and after meals and at bedtime. Or you may rinse with 2 tablespoons of three-percent hydrogen peroxide mixed in eight ounces of water . If you are still having problems with your mouth or sores in your mouth please call the clinic. If you need dental work, please let the doctor know before you go for your appointment so that we can coordinate the best possible time for you in regards to your chemo regimen. You need to also let your dentist know that you are actively taking chemo. We may need to do labs prior to your dental appointment.  Skin Care Always use sunscreen that has not expired and with SPF (Sun Protection Factor) of 50 or higher. Wear hats to protect your head from the sun. Remember to use sunscreen on your hands, ears, face, & feet.  Use good moisturizing lotions such as udder cream, eucerin, or even Vaseline. Some chemotherapies can cause dry skin, color changes in your skin and nails.    Avoid long, hot showers or baths. Use gentle,  fragrance-free soaps and laundry detergent. Use moisturizers, preferably creams or ointments rather than lotions because the thicker consistency is better at preventing skin dehydration. Apply the cream or ointment within 15 minutes of showering. Reapply moisturizer at night, and moisturize your hands every time after you wash them.   Infection Prevention Please wash your hands for at least 30 seconds using warm soapy water . Handwashing is the #1 way to prevent the spread of germs. Stay away from sick people or people who are getting over a cold. If you develop respiratory systems such as green/yellow mucus production or productive cough or persistent cough let us  know and we will see if you need an antibiotic. It is a good idea to keep a pair of gloves on when going into grocery stores/[employer] to decrease your risk of coming into contact with germs on the carts, etc. Carry alcohol hand gel with you at all times and use it frequently if out in public. If your temperature reaches 100.5 or higher please call the clinic and  let us  know.  If it is after hours or on the weekend please go to the ER if your temperature is over 100.4.  Please have your own personal thermometer at home to use.    Sex and bodily fluids If you are going to have sex, a condom must be used to protect the person that isn't taking immunotherapy. For a few days after treatment, immunotherapy can be excreted through your bodily fluids.  When using the toilet please close the lid and flush the toilet twice.  Do this for a few day after you have had immunotherapy.   Contraception It is not known for sure whether or not immunotherapy drugs can be passed on through semen or secretions from the vagina. Because of this some doctors advise people to use a barrier method if you have sex during treatment. This applies to vaginal, anal or oral sex.  Generally, doctors advise a barrier method only for the time you are actually having the treatment  and for about a week after your treatment.  Advice like this can be worrying, but this does not mean that you have to avoid being intimate with your partner. You can still have close contact with your partner and continue to enjoy sex.  Animals If you have cats or birds we just ask that you not change the litter or change the cage.  Please have someone else do this for you while you are on immunotherapy.   Food Safety During and After Cancer Treatment Food safety is important for people both during and after cancer treatment. Cancer and cancer treatments, such as chemotherapy, radiation therapy, and stem cell/bone marrow transplantation, often weaken the immune system. This makes it harder for your body to protect itself from foodborne illness, also called food poisoning. Foodborne illness is caused by eating food that contains harmful bacteria, parasites, or viruses.  Foods to avoid Some foods have a higher risk of becoming tainted with bacteria. These include: Unwashed fresh fruit and vegetables, especially leafy vegetables that can hide dirt and other contaminants Raw sprouts, such as alfalfa sprouts Raw or undercooked beef, especially ground beef, or other raw or undercooked meat and poultry Fatty, fried, or spicy foods immediately before or after treatment.  These can sit heavy on your stomach and make you feel nauseous. Raw or undercooked shellfish, such as oysters. Sushi and sashimi, which often contain raw fish.  Unpasteurized beverages, such as unpasteurized fruit juices, raw milk, raw yogurt, or cider Undercooked eggs, such as soft boiled, over easy, and poached; raw, unpasteurized eggs; or foods made with raw egg, such as homemade raw cookie dough and homemade mayonnaise  Simple steps for food safety  Shop smart. Do not buy food stored or displayed in an unclean area. Do not buy bruised or damaged fruits or vegetables. Do not buy cans that have cracks, dents, or bulges. Pick up  foods that can spoil at the end of your shopping trip and store them in a cooler on the way home.  Prepare and clean up foods carefully. Rinse all fresh fruits and vegetables under running water , and dry them with a clean towel or paper towel. Clean the top of cans before opening them. After preparing food, wash your hands for 20 seconds with hot water  and soap. Pay special attention to areas between fingers and under nails. Clean your utensils and dishes with hot water  and soap. Disinfect your kitchen and cutting boards using 1 teaspoon of liquid, unscented bleach mixed into  1 quart of water .    Dispose of old food. Eat canned and packaged food before its expiration date (the "use by" or "best before" date). Consume refrigerated leftovers within 3 to 4 days. After that time, throw out the food. Even if the food does not smell or look spoiled, it still may be unsafe. Some bacteria, such as Listeria, can grow even on foods stored in the refrigerator if they are kept for too long.  Take precautions when eating out. At restaurants, avoid buffets and salad bars where food sits out for a long time and comes in contact with many people. Food can become contaminated when someone with a virus, often a norovirus, or another "bug" handles it. Put any leftover food in a "to-go" container yourself, rather than having the server do it. And, refrigerate leftovers as soon as you get home. Choose restaurants that are clean and that are willing to prepare your food as you order it cooked.    SYMPTOMS TO REPORT AS SOON AS POSSIBLE AFTER TREATMENT:  FEVER GREATER THAN 100.4 F CHILLS WITH OR WITHOUT FEVER NAUSEA AND VOMITING THAT IS NOT CONTROLLED WITH YOUR NAUSEA MEDICATION UNUSUAL SHORTNESS OF BREATH UNUSUAL BRUISING OR BLEEDING TENDERNESS IN MOUTH AND THROAT WITH OR WITHOUT PRESENCE OF ULCERS URINARY PROBLEMS BOWEL PROBLEMS UNUSUAL RASH     Wear comfortable clothing and clothing appropriate for easy  access to any Portacath or PICC line. Let us  know if there is anything that we can do to make your therapy better!   What to do if you need assistance after hours or on the weekends: CALL [phone].  HOLD on the line, do not hang up.  You will hear multiple messages but at the end you will be connected with a nurse triage line.  They will contact the doctor if necessary.  Most of the time they will be able to assist you.  Do not call the hospital operator.    I have been informed and understand all of the instructions given to me and have received a copy. I have been instructed to call the clinic [phone] or my family physician as soon as possible for continued medical care, if indicated. I do not have any more questions at this time but understand that I may call the Cancer Center or the Patient Navigator at [phone] during office hours should I have questions or need assistance in obtaining follow-up care.

## 2024-08-29 ENCOUNTER — Inpatient Hospital Stay

## 2024-08-29 DIAGNOSIS — E8581 Light chain (AL) amyloidosis: Secondary | ICD-10-CM

## 2024-08-29 NOTE — Progress Notes (Signed)

## 2024-08-30 ENCOUNTER — Telehealth: Payer: Self-pay | Admitting: Pharmacy Technician

## 2024-08-30 NOTE — Telephone Encounter (Addendum)
 Oral Oncology Patient Advocate Encounter   New authorization   Received notification that prior authorization for cyclophosphamide  is required.   PA submitted on CMM via Latent PA Key: [id number] Status is pending  Attaching RN conversation for reference:    Ladeana Laplant (Patty) Chet Burnet, CPhT  [facility], [place], [facility] - Oral Chemotherapy Patient Advocate Specialist III Phone: [phone]  Fax: [phone]

## 2024-08-31 ENCOUNTER — Other Ambulatory Visit: Payer: Self-pay

## 2024-08-31 ENCOUNTER — Encounter: Payer: Self-pay | Admitting: Oncology

## 2024-08-31 ENCOUNTER — Other Ambulatory Visit (HOSPITAL_COMMUNITY): Payer: Self-pay

## 2024-08-31 ENCOUNTER — Telehealth: Payer: Self-pay | Admitting: Pharmacist

## 2024-08-31 DIAGNOSIS — E8581 Light chain (AL) amyloidosis: Secondary | ICD-10-CM

## 2024-08-31 MED ORDER — CYCLOPHOSPHAMIDE 50 MG PO CAPS
300.0000 mg/m2 | ORAL_CAPSULE | ORAL | 5 refills | Status: DC
  Filled 2024-08-31 – 2024-09-01 (×2): qty 52, 28d supply, fill #0
  Filled 2024-09-25 – 2024-09-29 (×7): qty 52, 28d supply, fill #1

## 2024-08-31 NOTE — Telephone Encounter (Signed)
 Prescription was sent in error to patient's local pharmacy by the office.  Redirecting to [employer] [facility] (Specialty)

## 2024-08-31 NOTE — Telephone Encounter (Signed)
 Clinical Pharmacist Practitioner Encounter   Received new prescription for Cytoxan  (cyclophosphamide ) for the treatment of amyloidosis in conjunction with daratumumab, dexamethasone , bortezomib, planned duration of 6 cycles. Planned start 09/05/24.   CMP from 07/19/24 assessed, SCr 1.19, CrCl ~35ml/min.  No renal dose adjustment needed at this time.  Patient will have CMP rechecked at her next office visit. Prescription dose and frequency assessed.   Current medication list in Epic reviewed, no DDIs with cyclophosphamide  identified.  Evaluated chart and no patient barriers to medication adherence identified.   Oral Oncology Clinic will continue to follow for insurance authorization, copayment issues, initial counseling and start date.   Jeremi Losito N. Syrita Dovel, PharmD, BCOP, CPP Hematology/Oncology Clinical Pharmacist [facility] Oral Chemotherapy Navigation Clinic [phone]  08/31/2024 1:21 PM

## 2024-09-01 ENCOUNTER — Other Ambulatory Visit: Payer: Self-pay | Admitting: Pharmacy Technician

## 2024-09-01 ENCOUNTER — Other Ambulatory Visit: Payer: Self-pay

## 2024-09-01 ENCOUNTER — Encounter: Payer: Self-pay | Admitting: Oncology

## 2024-09-01 ENCOUNTER — Telehealth: Payer: Self-pay | Admitting: Pharmacist

## 2024-09-01 ENCOUNTER — Other Ambulatory Visit (HOSPITAL_COMMUNITY): Payer: Self-pay

## 2024-09-01 DIAGNOSIS — E8581 Light chain (AL) amyloidosis: Secondary | ICD-10-CM

## 2024-09-01 NOTE — Progress Notes (Signed)
 Clinical Pharmacist Practitioner Encounter   [facility] (Specialty) will deliver medication to patient on 09/05/24.  Patient knows to start once they have medication in hand.  Patient Education I spoke with patient for overview of new oral chemotherapy medication: Cytoxan  (cyclophosphamide ) for the treatment of amyloidosis in conjunction with daratumumab, dexamethasone , bortezomib, planned duration of 6 cycles. Planned start 09/05/24.   Treatment goal: Control  Counseled patient on administration, dosing, side effects, monitoring, drug-food interactions, safe handling, storage, and disposal. Patient will take 13 capsules (650 mg total) by mouth once a week. Take with breakfast.   She will also take ondansetron  8mg  30 to 60 min prior to cyclophosphamide  administration to help with nausea mangament. She knows she can also use ondansetron  and prochlorperazine  as needed.   Side effects include but not limited to: nausea, hair thinning, decreased wbc/hgb/plt.    Reviewed with patient importance of keeping a medication schedule and plan for any missed doses.  After discussion with patient no patient barriers to medication adherence identified.   Distress evaluation: completed at infusion  Communication and Learning Assessment Primary learner: patient Barriers to learning: No barriers Preferred language: English Learning preferences: Listening Reading   Ms. Zent voiced understanding and appreciation. All questions answered. Medication handout provided.  Provided patient with Oral Chemotherapy Navigation Clinic phone number. Patient knows to call the office with questions or concerns. Reviewed with patient the expectations for rescheduling or cancelling appointments.  Oral Chemotherapy Navigation Clinic will continue to follow.  Massimo Hartland N. Rett Stehlik, PharmD, BCOP, CPP Hematology/Oncology Clinical Pharmacist [facility] Oral Chemotherapy Navigation  Clinic [phone]  09/01/2024 12:32 PM

## 2024-09-01 NOTE — Telephone Encounter (Signed)
 [employer] [facility] (Specialty) will deliver medication to patient on 09/05/24.  Patient knows to start once they have medication in hand.

## 2024-09-01 NOTE — Progress Notes (Signed)
 Specialty Pharmacy Initial Fill Coordination Note  Sara [name] is a 78 y.o. female contacted today regarding initial fill of specialty medication(s) cycloPHOSphamide  (CYTOXAN )   Patient requested Delivery   Delivery date: 09/05/24   Verified address: [address] [place] [address]   Medication will be filled on: 09/04/24   Patient is aware of $0 copayment.   Kyrsten Deleeuw (Patty) Chet Burnet, CPhT  [facility], [place], [facility] - Oral Chemotherapy Patient Advocate Specialist III Phone: [phone]  Fax: [phone]

## 2024-09-01 NOTE — Progress Notes (Signed)
 Patient education documented in EPIC note on 09/01/24.

## 2024-09-01 NOTE — Telephone Encounter (Signed)
 Oral Oncology Patient Advocate Encounter  Prior Authorization for cyclophosphamide  has been approved.    PA# [id number] Effective dates: 08/31/2024 through 09/20/2025  Patients co-pay is $0.    Sara [name] (Sara [name]) Chet Burnet, CPhT  [facility], [place], [facility] - Oral Chemotherapy Patient Advocate Specialist III Phone: [phone]  Fax: [phone]

## 2024-09-01 NOTE — Patient Instructions (Signed)
 [facility] - [facility] Dept Of [place]. [facility]  Thank you for choosing [facility] to provide your oncology/hematology care and for allowing us  to participate in your care today!  As a reminder, we spoke about the following today: Cytoxan  (cyclophosphamide ) for the treatment of amyloidosis in conjunction with daratumumab, dexamethasone , bortezomib, planned duration of 6 cycles. Planned start 09/05/24.   Treatment goal: Control  Medication handout has been provided.   **For oral cancer medication prescription refill requests, contact your pharmacy and they will contact our office if needed. Allow 5-7 days for refills to be completed by your specialty pharmacy.    Cancer Center General Instructions:  If you have an appointment at the [facility], please go directly to the Cancer Center and check in at the registration area.  We strive to give you quality time with your provider. You may need to reschedule your appointment if you arrive late (15 or more minutes).  Arriving late affects you and other patients whose appointments are after yours.  Also, if you miss three or more appointments without notifying the office, you may be dismissed from the clinic at the provider's discretion.      BELOW ARE SYMPTOMS THAT SHOULD BE REPORTED IMMEDIATELY: *FEVER GREATER THAN 100.4 F (38 C) OR HIGHER *CHILLS OR SWEATING *NAUSEA AND VOMITING THAT IS NOT CONTROLLED WITH YOUR NAUSEA MEDICATION *UNUSUAL SHORTNESS OF BREATH *UNUSUAL BRUISING OR BLEEDING *URINARY PROBLEMS (pain or burning when urinating, or frequent urination) *BOWEL PROBLEMS (unusual diarrhea, constipation, pain near the anus) TENDERNESS IN MOUTH AND THROAT WITH OR WITHOUT PRESENCE OF ULCERS (sore throat, sores in mouth, or a toothache) UNUSUAL RASH, SWELLING OR PAIN  UNUSUAL VAGINAL DISCHARGE OR ITCHING   Items with * indicate a potential emergency and should be followed up as soon as possible or go  to the Emergency Department if any problems should occur.  Should you have questions after your visit or need to cancel or reschedule your appointment, please contact [facility] [place] - A Dept Of [place]. [facility]  [phone] and follow the prompts.  Office hours are 8:00 a.m. to 4:30 p.m. Monday - Friday. Please note that voicemails left after 4:00 p.m. may not be returned until the following business day.  We are closed weekends and major holidays. You have access to a nurse at all times for urgent questions. Please call the main number to the clinic [phone] and follow the prompts.  For any non-urgent questions, you may also contact your provider using MyChart. We now offer e-Visits for anyone 10 and older to request care online for non-urgent symptoms. For details visit [email or web address].   Also download the MyChart app! Go to the app store, search MyChart, open the app, select [facility], and log in with your MyChart username and password.

## 2024-09-04 ENCOUNTER — Other Ambulatory Visit: Payer: Self-pay

## 2024-09-04 NOTE — Progress Notes (Signed)
 Pharmacist Chemotherapy Monitoring - Initial Assessment    Anticipated start date: 09/05/24   The following has been reviewed per standard work regarding the patient's treatment regimen: The patient's diagnosis, treatment plan and drug doses, and organ/hematologic function Lab orders and baseline tests specific to treatment regimen  The treatment plan start date, drug sequencing, and pre-medications Prior authorization status  Patient's documented medication list, including drug-drug interaction screen and prescriptions for anti-emetics and supportive care specific to the treatment regimen The drug concentrations, fluid compatibility, administration routes, and timing of the medications to be used The patient's access for treatment and lifetime cumulative dose history, if applicable  The patient's medication allergies and previous infusion related reactions, if applicable   Changes made to treatment plan:  N/A  Follow up needed:  N/A   Niels FORBES Molt, [facility], 09/04/2024  3:44 PM

## 2024-09-05 ENCOUNTER — Other Ambulatory Visit: Payer: Self-pay | Admitting: Oncology

## 2024-09-05 ENCOUNTER — Inpatient Hospital Stay

## 2024-09-05 VITALS — BP 107/69 | HR 59 | Temp 97.6°F | Resp 18 | Wt 218.0 lb

## 2024-09-05 DIAGNOSIS — E8581 Light chain (AL) amyloidosis: Secondary | ICD-10-CM | POA: Diagnosis not present

## 2024-09-05 LAB — COMPREHENSIVE METABOLIC PANEL WITH GFR
ALT: 24 U/L (ref 0–44)
AST: 43 U/L — ABNORMAL HIGH (ref 15–41)
Albumin: 3 g/dL — ABNORMAL LOW (ref 3.5–5.0)
Alkaline Phosphatase: 86 U/L (ref 38–126)
Anion gap: 7 (ref 5–15)
BUN: 17 mg/dL (ref 8–23)
CO2: 31 mmol/L (ref 22–32)
Calcium: 8.4 mg/dL — ABNORMAL LOW (ref 8.9–10.3)
Chloride: 100 mmol/L (ref 98–111)
Creatinine, Ser: 1.53 mg/dL — ABNORMAL HIGH (ref 0.44–1.00)
GFR, Estimated: 35 mL/min — ABNORMAL LOW (ref >60)
Glucose, Bld: 109 mg/dL — ABNORMAL HIGH (ref 70–99)
Potassium: 3.9 mmol/L (ref 3.5–5.1)
Sodium: 137 mmol/L (ref 135–145)
Total Bilirubin: 0.4 mg/dL (ref 0.0–1.2)
Total Protein: 5.5 g/dL — ABNORMAL LOW (ref 6.5–8.1)

## 2024-09-05 LAB — CBC WITH DIFFERENTIAL/PLATELET
Abs Immature Granulocytes: 0.01 K/uL (ref 0.00–0.07)
Basophils Absolute: 0 K/uL (ref 0.0–0.1)
Basophils Relative: 1 %
Eosinophils Absolute: 0.2 K/uL (ref 0.0–0.5)
Eosinophils Relative: 3 %
HCT: 41.6 % (ref 36.0–46.0)
Hemoglobin: 13.9 g/dL (ref 12.0–15.0)
Immature Granulocytes: 0 %
Lymphocytes Relative: 37 %
Lymphs Abs: 2.2 K/uL (ref 0.7–4.0)
MCH: 31.7 pg (ref 26.0–34.0)
MCHC: 33.4 g/dL (ref 30.0–36.0)
MCV: 95 fL (ref 80.0–100.0)
Monocytes Absolute: 0.4 K/uL (ref 0.1–1.0)
Monocytes Relative: 7 %
Neutro Abs: 3.1 K/uL (ref 1.7–7.7)
Neutrophils Relative %: 52 %
Platelets: 229 K/uL (ref 150–400)
RBC: 4.38 MIL/uL (ref 3.87–5.11)
RDW: 13.6 % (ref 11.5–15.5)
WBC: 6 K/uL (ref 4.0–10.5)
nRBC: 0 % (ref 0.0–0.2)

## 2024-09-05 LAB — MAGNESIUM: Magnesium: 2.5 mg/dL — ABNORMAL HIGH (ref 1.7–2.4)

## 2024-09-05 LAB — PRETREATMENT RBC PHENOTYPE

## 2024-09-05 LAB — TYPE AND SCREEN
ABO/RH(D): B POS
Antibody Screen: NEGATIVE

## 2024-09-05 MED ORDER — DARATUMUMAB-HYALURONIDASE-FIHJ 1800-30000 MG-UT/15ML ~~LOC~~ SOLN
1800.0000 mg | Freq: Once | SUBCUTANEOUS | Status: AC
  Administered 2024-09-05: 11:00:00 1800 mg via SUBCUTANEOUS
  Filled 2024-09-05: qty 15

## 2024-09-05 MED ORDER — ACETAMINOPHEN 325 MG PO TABS
650.0000 mg | ORAL_TABLET | Freq: Once | ORAL | Status: AC
  Administered 2024-09-05: 10:00:00 650 mg via ORAL
  Filled 2024-09-05: qty 2

## 2024-09-05 MED ORDER — MONTELUKAST SODIUM 10 MG PO TABS
10.0000 mg | ORAL_TABLET | Freq: Once | ORAL | Status: AC
  Administered 2024-09-05: 10:00:00 10 mg via ORAL
  Filled 2024-09-05: qty 1

## 2024-09-05 MED ORDER — DEXAMETHASONE 4 MG PO TABS
40.0000 mg | ORAL_TABLET | Freq: Once | ORAL | Status: AC
  Administered 2024-09-05: 10:00:00 40 mg via ORAL
  Filled 2024-09-05: qty 10

## 2024-09-05 MED ORDER — DIPHENHYDRAMINE HCL 25 MG PO CAPS
50.0000 mg | ORAL_CAPSULE | Freq: Once | ORAL | Status: AC
  Administered 2024-09-05: 10:00:00 50 mg via ORAL
  Filled 2024-09-05: qty 2

## 2024-09-05 MED ORDER — BORTEZOMIB CHEMO SQ INJECTION 3.5 MG (2.5MG/ML)
1.3000 mg/m2 | Freq: Once | INTRAMUSCULAR | Status: AC
  Administered 2024-09-05: 11:00:00 2.75 mg via SUBCUTANEOUS
  Filled 2024-09-05: qty 1.1

## 2024-09-05 NOTE — Patient Instructions (Signed)
 [facility] [place] - A DEPT OF . [facility]  Discharge Instructions: Thank you for choosing [facility] to provide your oncology and hematology care.  If you have a lab appointment with the Cancer Center - please note that after April 8th, 2024, all labs will be drawn in the cancer center.  You do not have to check in or register with the main entrance as you have in the past but will complete your check-in in the cancer center.  Wear comfortable clothing and clothing appropriate for easy access to any Portacath or PICC line.   We strive to give you quality time with your provider. You may need to reschedule your appointment if you arrive late (15 or more minutes).  Arriving late affects you and other patients whose appointments are after yours.  Also, if you miss three or more appointments without notifying the office, you may be dismissed from the clinic at the providers discretion.      For prescription refill requests, have your pharmacy contact our office and allow 72 hours for refills to be completed.    Today you received the following chemotherapy and/or immunotherapy agents daratumumab  and velcade       To help prevent nausea and vomiting after your treatment, we encourage you to take your nausea medication as directed.  BELOW ARE SYMPTOMS THAT SHOULD BE REPORTED IMMEDIATELY: *FEVER GREATER THAN 100.4 F (38 C) OR HIGHER *CHILLS OR SWEATING *NAUSEA AND VOMITING THAT IS NOT CONTROLLED WITH YOUR NAUSEA MEDICATION *UNUSUAL SHORTNESS OF BREATH *UNUSUAL BRUISING OR BLEEDING *URINARY PROBLEMS (pain or burning when urinating, or frequent urination) *BOWEL PROBLEMS (unusual diarrhea, constipation, pain near the anus) TENDERNESS IN MOUTH AND THROAT WITH OR WITHOUT PRESENCE OF ULCERS (sore throat, sores in mouth, or a toothache) UNUSUAL RASH, SWELLING OR PAIN  UNUSUAL VAGINAL DISCHARGE OR ITCHING   Items with * indicate a potential emergency and should  be followed up as soon as possible or go to the Emergency Department if any problems should occur.  Please show the CHEMOTHERAPY ALERT CARD or IMMUNOTHERAPY ALERT CARD at check-in to the Emergency Department and triage nurse.  Should you have questions after your visit or need to cancel or reschedule your appointment, please contact [facility]  - A DEPT OF [facility] [phone]  and follow the prompts.  Office hours are 8:00 a.m. to 4:30 p.m. Monday - Friday. Please note that voicemails left after 4:00 p.m. may not be returned until the following business day.  We are closed weekends and major holidays. You have access to a nurse at all times for urgent questions. Please call the main number to the clinic [phone] and follow the prompts.  For any non-urgent questions, you may also contact your provider using MyChart. We now offer e-Visits for anyone 53 and older to request care online for non-urgent symptoms. For details visit [email or web address].   Also download the MyChart app! Go to the app store, search MyChart, open the app, select [place], and log in with your MyChart username and password.

## 2024-09-05 NOTE — Progress Notes (Signed)
Patient tolerated Daratumumab injection with no complaints voiced.  See MAR for details.  Labs reviewed. Injection site clean and dry with no bruising or swelling noted at site.  Band aid applied.  Vss with discharge and left in satisfactory condition with no s/s of distress noted.

## 2024-09-06 ENCOUNTER — Inpatient Hospital Stay: Admitting: Licensed Clinical Social Worker

## 2024-09-06 ENCOUNTER — Telehealth: Payer: Self-pay

## 2024-09-06 DIAGNOSIS — E8581 Light chain (AL) amyloidosis: Secondary | ICD-10-CM

## 2024-09-06 NOTE — Telephone Encounter (Signed)
 Patient called for 24-hour follow-up visit patient states she is feeling good after treatment yesterday. Patient made aware to call cancer center if needed.

## 2024-09-06 NOTE — Progress Notes (Signed)
 [facility] Clinical Social Work  Initial Assessment   Sara [name] is a 78 y.o. year old female contacted by phone. Clinical Social Work was referred by medical provider for assessment of psychosocial needs.   SDOH (Social Determinants of Health) assessments performed: Yes SDOH Interventions    Flowsheet Row Office Visit from 05/31/2024 in [facility] [place] - A Dept Of [place]. [facility] ED to Hosp-Admission (Discharged) from 12/20/2022 in [place] [facility]  SDOH Interventions    Food Insecurity Interventions Intervention Not Indicated --  Housing Interventions Intervention Not Indicated Intervention Not Indicated  Transportation Interventions Intervention Not Indicated --  Utilities Interventions Intervention Not Indicated --    SDOH Screenings   Food Insecurity: No Food Insecurity (05/31/2024)  Housing: Low Risk (05/31/2024)  Transportation Needs: No Transportation Needs (05/31/2024)  Utilities: Not At Risk (05/31/2024)  Depression (PHQ2-9): Low Risk (09/05/2024)  Tobacco Use: Low Risk (09/01/2024)   Received from [facility]  Recent Concern: Tobacco Use - Medium Risk (08/15/2024)   Received from [facility]    PHQ 2/9:    09/05/2024    9:13 AM 08/23/2024    9:56 AM 07/19/2024    2:52 PM  Depression screen PHQ 2/9  Decreased Interest 0 0 0  Down, Depressed, Hopeless 0 0 0  PHQ - 2 Score 0 0 0  Altered sleeping  0 0  Tired, decreased energy  0 0  Change in appetite  0 0  Feeling bad or failure about yourself   0 0  Trouble concentrating  0 0  Moving slowly or fidgety/restless  0 0  Suicidal thoughts  0 0  PHQ-9 Score  0 0   Difficult doing work/chores  Not difficult at all      Data saved with a previous flowsheet row definition     Distress Screen completed: Yes    08/29/2024   11:24 AM  ONCBCN DISTRESS SCREENING  Screening Type Initial Screening  How much distress have you been experiencing in the past week? (0-10) 0   Spiritual/Religous concerns type Sense of meaning or purpose  Physical Concerns Type  Pain;Sleep;Fatigue;Loss or change of physical abilities      Family/Social Information:  Housing Arrangement: patient lives with her husband, pt states she is independent in ADLs utilizing a walker and cane only as needed.  Presently pt is not able to walk long distances and utilizes a wheelchair for doctor's visits.  CSW provided pt w/ contact details for the [employer] to acquire a transport chair if it may be helpful. Family members/support persons in your life? Pt reports her children reside close by and she has a number of good friends who are able to assist as needed. Transportation concerns: no  Employment: Retired   Income source: Secretary/administrator concerns: Yes, current concerns Type of concern: Advertising Account Planner access concerns: no Religious or spiritual practice: Yes-Christian Advanced directives: Not known Services Currently in place:  none  Coping/ Adjustment to diagnosis: Patient understands treatment plan and what happens next? yes Concerns about diagnosis and/or treatment: Overwhelmed by information and Quality of life Patient reported stressors: Finances Hopes and/or priorities: pt's priority is to start treatment w/ the hope of positive results. Patient enjoys time with family/ friends Current coping skills/ strengths: Capable of independent living , Motivation for treatment/growth , Physical Health , and Supportive family/friends     SUMMARY: Current SDOH Barriers:  Financial constraints related to fixed income  Clinical Social Work Clinical Goal(s):  Explore community resource options for unmet needs related to:  Financial Strain   Interventions: Discussed common feeling and emotions when being diagnosed with cancer, and the importance of support during treatment Informed patient of the support team roles and support services at  [facility] Provided CSW contact information and encouraged patient to call with any questions or concerns Provided pt w/ information regarding the [employer] and how to apply.  Sent link for blood cancer [employer] to apply for a grant.   Follow Up Plan: Patient will contact CSW with any support or resource needs Patient verbalizes understanding of plan: Yes    Devere JONELLE Manna, LCSW Clinical Social Worker [facility]

## 2024-09-09 ENCOUNTER — Other Ambulatory Visit: Payer: Self-pay

## 2024-09-11 ENCOUNTER — Encounter: Attending: Student | Admitting: Nutrition

## 2024-09-11 ENCOUNTER — Encounter: Payer: Self-pay | Admitting: Nutrition

## 2024-09-11 VITALS — Ht 66.0 in | Wt 219.0 lb

## 2024-09-11 DIAGNOSIS — I10 Essential (primary) hypertension: Secondary | ICD-10-CM | POA: Insufficient documentation

## 2024-09-11 NOTE — Progress Notes (Signed)
 Medical Nutrition Therapy  Appointment Start time:  1430  Appointment End time:  1510  Primary concerns today: HTN  Referral diagnosis: i10.0 Preferred learning style: NO Preference  Learning readiness: Ready   NUTRITION ASSESSMENT  Follow up HTN 78 yr old bfemale referred for HTN from Cardiologist. She notes she has CKD also and sees [facility]. CKD Stg 3a she reports. She is here with her husband. She has been using [employer]. Her husband does the cooking and has been baking and broiling foods. Has cut out processed foods, meats. Eating more salads and fruits. Started immunotherapy for amyloidosis that she was recently diagnosed with. Complains of fatigue. Doesn't have energy to exercise. Walks with a cane. eGFR 53 now. Creatine 1.53 mg/dl. Encouraged to increase fluid intake within allowance.  She is very interested in Lifestyle Medicine with a whole plant based diet and lifestyle to improve her health with HTN and CKD and lower co morbidities.  Goals set previously; Drink more water  80 oz--improved Cut back on fat and avoid beef and pork and stick to chicken, fish and turkey-improved Less fried foods and more baked Try to egg whites-- not done Get rid of salt shaker and use more herbs and spices- done Focus on whole plant based foods-done  Walk 10 minutes three times per day- still has to do. Uses a walker Lose 2-3 lbs per month-wt same Clinical Medical Hx:  Past Medical History:  Diagnosis Date   Allergic rhinitis due to pollen    Anxiety    Arthritis    knees, back, right elbow; left shoulder; right ankle (11/20/2014)   Breast cancer, right breast (HCC) 2016   DCIS; zero stage S/P mastectomy, cancer free since then   Cervical spondylosis without myelopathy    Cervicalgia    Chronic lower back pain    Constipation    takes Miralax  daily   Degeneration of cervical intervertebral disc    Depression    Diverticulosis of colon (without mention of hemorrhage)     GERD (gastroesophageal reflux disease)    takes Omeprazole daily   Graves' disease    took a pill to correct   H/O hiatal hernia    H/O urinary frequency    Headache(784.0)    denies migraines since the 80's but has occ and takes Butalbital prn   HTN (hypertension)    takes Metoprolol  and Lisinopril  daily   Insomnia    takes Ativan  nigtly   Joint pain    all of them   Joint swelling    knees, legs, ankles sometimes (11/20/2014)   Morbid obesity (HCC)    Numbness    LOWER LEGS   Other postablative hypothyroidism    takes SYnthroid  daily   Palpitations    Peptic ulcer, unspecified site, unspecified as acute or chronic, without mention of hemorrhage, perforation, or obstruction    Primary localized osteoarthrosis, lower leg    Pure hypercholesterolemia    takes Pravastin daily   Recurrent UTI    Renal cell carcinoma (HCC) 01/08/2016   Right renal mass s/p partial right nephrectomy, cancer free since 2017   Shortness of breath    Sleep apnea    slight but doesn't require a CPAP (11/20/2014)   Tendonitis of knee    bilateral    Medications:  Current Outpatient Medications on File Prior to Visit  Medication Sig Dispense Refill   acetaminophen  (TYLENOL ) 500 MG tablet Take 650 mg by mouth as needed for moderate pain or headache.  acyclovir  (ZOVIRAX ) 400 MG tablet Take 1 tablet (400 mg total) by mouth 2 (two) times daily. 60 tablet 5   aspirin  EC 81 MG tablet Take 81 mg by mouth daily. Swallow whole.     atorvastatin  (LIPITOR) 40 MG tablet Take 40 mg by mouth daily.     cyclophosphamide  (CYTOXAN ) 50 MG capsule Take 13 capsules (650 mg total) by mouth once a week. Take with breakfast. 52 capsule 5   dapagliflozin propanediol (FARXIGA) 10 MG TABS tablet Take 10 mg by mouth.     gabapentin  (NEURONTIN ) 600 MG tablet TAKE 1 TABLET BY MOUTH TWICE  DAILY 200 tablet 2   levothyroxine  (SYNTHROID ) 150 MCG tablet Take 1 tablet (150 mcg total) by mouth daily.     lidocaine -prilocaine   (EMLA ) cream Apply to affected area once 30 g 3   Magnesium  400 MG CAPS Take 400 mg by mouth daily.     metoprolol  succinate (TOPROL  XL) 25 MG 24 hr tablet Take 1 1/2 tablet in the morning (37.5 mg) and take 1/2 (12.5mg ) tablet at night. 180 tablet 3   Multiple Vitamins-Minerals (CENTRUM SILVER PO) Take 1 tablet by mouth daily.     nitroGLYCERIN  (NITROSTAT ) 0.4 MG SL tablet Place 1 tablet (0.4 mg total) under the tongue every 5 (five) minutes as needed for chest pain. 25 tablet 3   omeprazole (PRILOSEC) 20 MG capsule Take 20 mg by mouth daily. May take a second 20 mg dose as needed for heartburn     ondansetron  (ZOFRAN ) 8 MG tablet Take 8 mg by mouth 30 to 60 min prior to Cyclophosphamide  administration then take 8 mg every 8 hrs as needed for nausea and vomiting. 30 tablet 1   potassium chloride  (KLOR-CON ) 20 MEQ packet Take 20 mEq by mouth 2 (two) times daily. 180 packet 3   prochlorperazine  (COMPAZINE ) 10 MG tablet Take 1 tablet (10 mg total) by mouth every 6 (six) hours as needed for nausea or vomiting. 30 tablet 1   torsemide  (DEMADEX ) 20 MG tablet Take 3 tablets (60 mg total) by mouth daily. 270 tablet 3   Vibegron (GEMTESA) 75 MG TABS Take 75 mg by mouth daily.     Vitamin D , Ergocalciferol , (DRISDOL ) 1.25 MG (50000 UNIT) CAPS capsule Take 1 capsule (50,000 Units total) by mouth every 14 (fourteen) days. 6 capsule 3   [DISCONTINUED] Ranitidine HCl (ZANTAC 75 PO) Take by mouth.       No current facility-administered medications on file prior to visit.    Labs: No results found for: HGBA1C    Latest Ref Rng & Units 09/05/2024    7:57 AM 07/19/2024    3:30 PM 05/31/2024    2:46 PM  CMP  Glucose 70 - 99 mg/dL 890  873  78   BUN 8 - 23 mg/dL 17  17  18    Creatinine 0.44 - 1.00 mg/dL 8.46  8.80  8.79   Sodium 135 - 145 mmol/L 137  142  141   Potassium 3.5 - 5.1 mmol/L 3.9  3.2  3.7   Chloride 98 - 111 mmol/L 100  102  97   CO2 22 - 32 mmol/L 31  28  30    Calcium  8.9 - 10.3 mg/dL 8.4   8.4  8.3   Total Protein 6.5 - 8.1 g/dL 5.5  6.0  6.2   Total Bilirubin 0.0 - 1.2 mg/dL 0.4  0.3  0.5   Alkaline Phos 38 - 126 U/L 86  92  91  AST 15 - 41 U/L 43  45  42   ALT 0 - 44 U/L 24  28  30     Lipid Panel     Component Value Date/Time   CHOL 141 06/07/2020 0833   TRIG 71 06/07/2020 0833   HDL 57 06/07/2020 0833   CHOLHDL 2.5 06/07/2020 0833   VLDL 14 03/06/2020 0420   LDLCALC 69 06/07/2020 0833    Notable Signs/Symptoms:   Lifestyle & Dietary Hx LIves at home with her husband. Eats most meals at home.60 oz  Estimated daily fluid intake: 60 oz Supplements: MVI  Vit D Sleep: 7-8 hrs Stress / self-care:  Current average weekly physical activity: ADL  24-Hr Dietary Recall First Meal: 8 am Toast-wheat bread with butter and honey, coffee with cream Snack:  Second Meal: Baked potato- butter, salt and pepper,  sparkling water  Snack:  cup of hot tea-dandilion and nettle tea herbal Third Meal: 7 pm  Boiled cabbage, 1 salmon cakes, hydrowater Snack:  Beverages: water   Estimated Energy Needs Calories: 1200-1500 Carbohydrate: 135g Protein: 90g Fat: 33g   NUTRITION DIAGNOSIS  NB-1.1 Food and nutrition-related knowledge deficit As related to Hypertension.  As evidenced by medication management of HTN, CVD with Lipitor, Metoprolol , Farxia,Torsemide .   NUTRITION INTERVENTION  Nutrition education (E-1) on the following topics:  Lifestyle Medicine  - Whole Food, Plant Predominant Nutrition is highly recommended: Eat Plenty of vegetables, Mushrooms, fruits, Legumes, Whole Grains, Nuts, seeds in lieu of processed meats, processed snacks/pastries red meat, poultry, eggs.    -It is better to avoid simple carbohydrates including: Cakes, Sweet Desserts, Ice Cream, Soda (diet and regular), Sweet Tea, Candies, Chips, Cookies, Store Bought Juices, Alcohol in Excess of  1-2 drinks a day, Lemonade,  Artificial Sweeteners, Doughnuts, Coffee Creamers, Sugar-free Products, etc, etc.   This is not a complete list.....  Exercise: If you are able: 30 -60 minutes a day ,4 days a week, or 150 minutes a week.  The longer the better.  Combine stretch, strength, and aerobic activities.  If you were told in the past that you have high risk for cardiovascular diseases, you may seek evaluation by your heart doctor prior to initiating moderate to intense exercise programs.   Handouts Provided Include  Lifestyle Medicine handouts  Learning Style & Readiness for Change Teaching method utilized: Visual & Auditory  Demonstrated degree of understanding via: Teach Back  Barriers to learning/adherence to lifestyle change: none  Goals Established by Pt Increase water  intake as allowed within fluid restriction if any. Walk 15 minutes a day as tolerated Keep focusing on more whole plant based foods Avoid salty and processed foods.   MONITORING & EVALUATION Dietary intake, weekly physical activity, and weight in 3 month.  Next Steps  Patient is to work on meal planning and focusing on whole plant based diet.SABRA

## 2024-09-11 NOTE — Patient Instructions (Addendum)
 Goals Established by Pt Increase water  intake as allowed within fluid restriction if any. Walk 15 minutes a day as tolerated Keep focusing on more whole plant based foods Avoid salty and processed foods.

## 2024-09-12 ENCOUNTER — Inpatient Hospital Stay

## 2024-09-12 ENCOUNTER — Other Ambulatory Visit: Payer: Self-pay

## 2024-09-12 VITALS — BP 102/60 | HR 66 | Temp 96.9°F | Resp 18 | Wt 219.2 lb

## 2024-09-12 DIAGNOSIS — E8581 Light chain (AL) amyloidosis: Secondary | ICD-10-CM

## 2024-09-12 LAB — COMPREHENSIVE METABOLIC PANEL WITH GFR
ALT: 18 U/L (ref 0–44)
AST: 30 U/L (ref 15–41)
Albumin: 3 g/dL — ABNORMAL LOW (ref 3.5–5.0)
Alkaline Phosphatase: 91 U/L (ref 38–126)
Anion gap: 12 (ref 5–15)
BUN: 21 mg/dL (ref 8–23)
CO2: 26 mmol/L (ref 22–32)
Calcium: 8.1 mg/dL — ABNORMAL LOW (ref 8.9–10.3)
Chloride: 101 mmol/L (ref 98–111)
Creatinine, Ser: 1.31 mg/dL — ABNORMAL HIGH (ref 0.44–1.00)
GFR, Estimated: 42 mL/min — ABNORMAL LOW
Glucose, Bld: 85 mg/dL (ref 70–99)
Potassium: 3.8 mmol/L (ref 3.5–5.1)
Sodium: 139 mmol/L (ref 135–145)
Total Bilirubin: 0.4 mg/dL (ref 0.0–1.2)
Total Protein: 5.4 g/dL — ABNORMAL LOW (ref 6.5–8.1)

## 2024-09-12 LAB — CBC WITH DIFFERENTIAL/PLATELET
Abs Immature Granulocytes: 0.05 K/uL (ref 0.00–0.07)
Basophils Absolute: 0 K/uL (ref 0.0–0.1)
Basophils Relative: 1 %
Eosinophils Absolute: 0.1 K/uL (ref 0.0–0.5)
Eosinophils Relative: 1 %
HCT: 42.1 % (ref 36.0–46.0)
Hemoglobin: 14 g/dL (ref 12.0–15.0)
Immature Granulocytes: 1 %
Lymphocytes Relative: 28 %
Lymphs Abs: 1.5 K/uL (ref 0.7–4.0)
MCH: 31.5 pg (ref 26.0–34.0)
MCHC: 33.3 g/dL (ref 30.0–36.0)
MCV: 94.8 fL (ref 80.0–100.0)
Monocytes Absolute: 0.5 K/uL (ref 0.1–1.0)
Monocytes Relative: 10 %
Neutro Abs: 3.1 K/uL (ref 1.7–7.7)
Neutrophils Relative %: 59 %
Platelets: 186 K/uL (ref 150–400)
RBC: 4.44 MIL/uL (ref 3.87–5.11)
RDW: 14 % (ref 11.5–15.5)
WBC: 5.2 K/uL (ref 4.0–10.5)
nRBC: 0 % (ref 0.0–0.2)

## 2024-09-12 LAB — MAGNESIUM: Magnesium: 2.5 mg/dL — ABNORMAL HIGH (ref 1.7–2.4)

## 2024-09-12 MED ORDER — DARATUMUMAB-HYALURONIDASE-FIHJ 1800-30000 MG-UT/15ML ~~LOC~~ SOLN
1800.0000 mg | Freq: Once | SUBCUTANEOUS | Status: AC
  Administered 2024-09-12: 1800 mg via SUBCUTANEOUS
  Filled 2024-09-12: qty 15

## 2024-09-12 MED ORDER — DIPHENHYDRAMINE HCL 25 MG PO TABS
50.0000 mg | ORAL_TABLET | Freq: Once | ORAL | Status: AC
  Administered 2024-09-12: 50 mg via ORAL
  Filled 2024-09-12: qty 2

## 2024-09-12 MED ORDER — ACETAMINOPHEN 325 MG PO TABS
650.0000 mg | ORAL_TABLET | Freq: Once | ORAL | Status: AC
  Administered 2024-09-12: 650 mg via ORAL
  Filled 2024-09-12: qty 2

## 2024-09-12 MED ORDER — BORTEZOMIB CHEMO SQ INJECTION 3.5 MG (2.5MG/ML)
1.3000 mg/m2 | Freq: Once | INTRAMUSCULAR | Status: AC
  Administered 2024-09-12: 2.75 mg via SUBCUTANEOUS
  Filled 2024-09-12: qty 1.1

## 2024-09-12 MED ORDER — DIPHENHYDRAMINE HCL 25 MG PO CAPS: 50.0000 mg | ORAL_CAPSULE | Freq: Once | ORAL | Status: DC

## 2024-09-12 MED ORDER — MONTELUKAST SODIUM 10 MG PO TABS
10.0000 mg | ORAL_TABLET | Freq: Once | ORAL | Status: AC
  Administered 2024-09-12: 10 mg via ORAL
  Filled 2024-09-12: qty 1

## 2024-09-12 MED ORDER — DEXAMETHASONE 4 MG PO TABS
40.0000 mg | ORAL_TABLET | Freq: Once | ORAL | Status: AC
  Administered 2024-09-12: 40 mg via ORAL
  Filled 2024-09-12: qty 10

## 2024-09-12 NOTE — Progress Notes (Signed)
 Patient presents today for Daratumumab /Velcade  injection per providers order.  Vital signs and labs within parameters for treatment.  Stable during administration without incident; injection site WNL; see MAR for injection details.  Patient tolerated procedure well and without incident.  No questions or complaints noted at this time.

## 2024-09-12 NOTE — Patient Instructions (Signed)
 [facility] [place] - A DEPT OF [place]  Discharge Instructions: Thank you for choosing [facility] to provide your oncology and hematology care.  If you have a lab appointment with the Cancer Center - please note that after April 8th, 2024, all labs will be drawn in the cancer center.  You do not have to check in or register with the main entrance as you have in the past but will complete your check-in in the cancer center.  Wear comfortable clothing and clothing appropriate for easy access to any Portacath or PICC line.   We strive to give you quality time with your provider. You may need to reschedule your appointment if you arrive late (15 or more minutes).  Arriving late affects you and other patients whose appointments are after yours.  Also, if you miss three or more appointments without notifying the office, you may be dismissed from the clinic at the providers discretion.      For prescription refill requests, have your pharmacy contact our office and allow 72 hours for refills to be completed.    Today you received the following chemotherapy and/or immunotherapy agents Daratumumab /velcade       To help prevent nausea and vomiting after your treatment, we encourage you to take your nausea medication as directed.  BELOW ARE SYMPTOMS THAT SHOULD BE REPORTED IMMEDIATELY: *FEVER GREATER THAN 100.4 F (38 C) OR HIGHER *CHILLS OR SWEATING *NAUSEA AND VOMITING THAT IS NOT CONTROLLED WITH YOUR NAUSEA MEDICATION *UNUSUAL SHORTNESS OF BREATH *UNUSUAL BRUISING OR BLEEDING *URINARY PROBLEMS (pain or burning when urinating, or frequent urination) *BOWEL PROBLEMS (unusual diarrhea, constipation, pain near the anus) TENDERNESS IN MOUTH AND THROAT WITH OR WITHOUT PRESENCE OF ULCERS (sore throat, sores in mouth, or a toothache) UNUSUAL RASH, SWELLING OR PAIN  UNUSUAL VAGINAL DISCHARGE OR ITCHING   Items with * indicate a potential emergency and should be  followed up as soon as possible or go to the Emergency Department if any problems should occur.  Please show the CHEMOTHERAPY ALERT CARD or IMMUNOTHERAPY ALERT CARD at check-in to the Emergency Department and triage nurse.  Should you have questions after your visit or need to cancel or reschedule your appointment, please contact [facility] [place] - A DEPT OF [facility] [phone]  and follow the prompts.  Office hours are 8:00 a.m. to 4:30 p.m. Monday - Friday. Please note that voicemails left after 4:00 p.m. may not be returned until the following business day.  We are closed weekends and major holidays. You have access to a nurse at all times for urgent questions. Please call the main number to the clinic [phone] and follow the prompts.  For any non-urgent questions, you may also contact your provider using MyChart. We now offer e-Visits for anyone 67 and older to request care online for non-urgent symptoms. For details visit [email or web address].   Also download the MyChart app! Go to the app store, search MyChart, open the app, select , and log in with your MyChart username and password.

## 2024-09-13 ENCOUNTER — Other Ambulatory Visit: Payer: Self-pay | Admitting: Oncology

## 2024-09-18 ENCOUNTER — Encounter: Payer: Self-pay | Admitting: *Deleted

## 2024-09-19 ENCOUNTER — Inpatient Hospital Stay

## 2024-09-19 ENCOUNTER — Other Ambulatory Visit: Payer: Self-pay

## 2024-09-19 ENCOUNTER — Encounter: Payer: Self-pay | Admitting: Oncology

## 2024-09-19 VITALS — BP 123/77 | HR 63 | Temp 98.1°F | Resp 18 | Wt 222.2 lb

## 2024-09-19 DIAGNOSIS — E8581 Light chain (AL) amyloidosis: Secondary | ICD-10-CM | POA: Diagnosis not present

## 2024-09-19 DIAGNOSIS — R5383 Other fatigue: Secondary | ICD-10-CM

## 2024-09-19 LAB — CBC WITH DIFFERENTIAL/PLATELET
Abs Immature Granulocytes: 0.02 K/uL (ref 0.00–0.07)
Basophils Absolute: 0 K/uL (ref 0.0–0.1)
Basophils Relative: 1 %
Eosinophils Absolute: 0 K/uL (ref 0.0–0.5)
Eosinophils Relative: 0 %
HCT: 41 % (ref 36.0–46.0)
Hemoglobin: 13.8 g/dL (ref 12.0–15.0)
Immature Granulocytes: 1 %
Lymphocytes Relative: 22 %
Lymphs Abs: 0.9 K/uL (ref 0.7–4.0)
MCH: 32 pg (ref 26.0–34.0)
MCHC: 33.7 g/dL (ref 30.0–36.0)
MCV: 95.1 fL (ref 80.0–100.0)
Monocytes Absolute: 0.4 K/uL (ref 0.1–1.0)
Monocytes Relative: 11 %
Neutro Abs: 2.6 K/uL (ref 1.7–7.7)
Neutrophils Relative %: 65 %
Platelets: 182 K/uL (ref 150–400)
RBC: 4.31 MIL/uL (ref 3.87–5.11)
RDW: 14.3 % (ref 11.5–15.5)
WBC: 4 K/uL (ref 4.0–10.5)
nRBC: 0 % (ref 0.0–0.2)

## 2024-09-19 LAB — COMPREHENSIVE METABOLIC PANEL WITH GFR
ALT: 15 U/L (ref 0–44)
AST: 28 U/L (ref 15–41)
Albumin: 3 g/dL — ABNORMAL LOW (ref 3.5–5.0)
Alkaline Phosphatase: 93 U/L (ref 38–126)
Anion gap: 12 (ref 5–15)
BUN: 15 mg/dL (ref 8–23)
CO2: 23 mmol/L (ref 22–32)
Calcium: 8.2 mg/dL — ABNORMAL LOW (ref 8.9–10.3)
Chloride: 102 mmol/L (ref 98–111)
Creatinine, Ser: 1.02 mg/dL — ABNORMAL HIGH (ref 0.44–1.00)
GFR, Estimated: 56 mL/min — ABNORMAL LOW
Glucose, Bld: 91 mg/dL (ref 70–99)
Potassium: 4 mmol/L (ref 3.5–5.1)
Sodium: 137 mmol/L (ref 135–145)
Total Bilirubin: 0.5 mg/dL (ref 0.0–1.2)
Total Protein: 5.2 g/dL — ABNORMAL LOW (ref 6.5–8.1)

## 2024-09-19 LAB — MAGNESIUM: Magnesium: 2.9 mg/dL — ABNORMAL HIGH (ref 1.7–2.4)

## 2024-09-19 MED ORDER — DARATUMUMAB-HYALURONIDASE-FIHJ 1800-30000 MG-UT/15ML ~~LOC~~ SOLN
1800.0000 mg | Freq: Once | SUBCUTANEOUS | Status: AC
  Administered 2024-09-19: 1800 mg via SUBCUTANEOUS
  Filled 2024-09-19: qty 15

## 2024-09-19 MED ORDER — ACETAMINOPHEN 325 MG PO TABS
650.0000 mg | ORAL_TABLET | Freq: Once | ORAL | Status: AC
  Administered 2024-09-19: 650 mg via ORAL
  Filled 2024-09-19: qty 2

## 2024-09-19 MED ORDER — DIPHENHYDRAMINE HCL 25 MG PO TABS
25.0000 mg | ORAL_TABLET | Freq: Once | ORAL | Status: AC
  Administered 2024-09-19: 25 mg via ORAL
  Filled 2024-09-19: qty 1

## 2024-09-19 MED ORDER — BORTEZOMIB CHEMO SQ INJECTION 3.5 MG (2.5MG/ML)
1.3000 mg/m2 | Freq: Once | INTRAMUSCULAR | Status: AC
  Administered 2024-09-19: 2.75 mg via SUBCUTANEOUS
  Filled 2024-09-19: qty 1.1

## 2024-09-19 MED ORDER — SODIUM CHLORIDE 0.9 % IV SOLN
INTRAVENOUS | Status: DC
  Administered 2024-09-19: 500 mL via INTRAVENOUS

## 2024-09-19 MED ORDER — MONTELUKAST SODIUM 10 MG PO TABS
10.0000 mg | ORAL_TABLET | Freq: Once | ORAL | Status: AC
  Administered 2024-09-19: 10 mg via ORAL
  Filled 2024-09-19: qty 1

## 2024-09-19 MED ORDER — DEXAMETHASONE 4 MG PO TABS
40.0000 mg | ORAL_TABLET | Freq: Once | ORAL | Status: AC
  Administered 2024-09-19: 40 mg via ORAL
  Filled 2024-09-19: qty 10

## 2024-09-19 NOTE — Patient Instructions (Signed)
 [facility] [place] - A DEPT OF [facility]  Discharge Instructions: Thank you for choosing [facility] to provide your oncology and hematology care.  If you have a lab appointment with the Cancer Center - please note that after April 8th, 2024, all labs will be drawn in the cancer center.  You do not have to check in or register with the main entrance as you have in the past but will complete your check-in in the cancer center.  Wear comfortable clothing and clothing appropriate for easy access to any Portacath or PICC line.   We strive to give you quality time with your provider. You may need to reschedule your appointment if you arrive late (15 or more minutes).  Arriving late affects you and other patients whose appointments are after yours.  Also, if you miss three or more appointments without notifying the office, you may be dismissed from the clinic at the providers discretion.      For prescription refill requests, have your pharmacy contact our office and allow 72 hours for refills to be completed.    Today you received the following chemotherapy and/or immunotherapy agents Darzalex  Faspro and Velcade .  Daratumumab  Injection What is this medication? DARATUMUMAB  (dar a toom ue mab) treats multiple myeloma, a type of bone marrow cancer. It works by helping your immune system slow or stop the spread of cancer cells. It is a monoclonal antibody. This medicine may be used for other purposes; ask your health care provider or pharmacist if you have questions. COMMON BRAND NAME(S): DARZALEX  What should I tell my care team before I take this medication? They need to know if you have any of these conditions: Hereditary fructose intolerance Infection, such as chickenpox, herpes, hepatitis B Lung or breathing disease, such as asthma, COPD An unusual or allergic reaction to daratumumab , sorbitol, other medications, foods, dyes, or preservatives Pregnant or  trying to get pregnant Breastfeeding How should I use this medication? This medication is injected into a vein. It is given by your care team in a hospital or clinic setting. Talk to your care team about the use of this medication in children. Special care may be needed. Overdosage: If you think you have taken too much of this medicine contact a poison control center or emergency room at once. NOTE: This medicine is only for you. Do not share this medicine with others. What if I miss a dose? Keep appointments for follow-up doses. It is important not to miss your dose. Call your care team if you are unable to keep an appointment. What may interact with this medication? Interactions have not been studied. This list may not describe all possible interactions. Give your health care provider a list of all the medicines, herbs, non-prescription drugs, or dietary supplements you use. Also tell them if you smoke, drink alcohol, or use illegal drugs. Some items may interact with your medicine. What should I watch for while using this medication? Your condition will be monitored carefully while you are receiving this medication. This medication can cause serious allergic reactions. To reduce your risk, your care team may give you other medication to take before receiving this one. Be sure to follow the directions from your care team. This medication can affect the results of blood tests to match your blood type. These changes can last for up to 6 months after the final dose. Your care team will do blood tests to match your blood type  before you start treatment. Tell all of your care team that you are being treated with this medication before receiving a blood transfusion. This medication can affect the results of some tests used to determine treatment response; extra tests may be needed to evaluate response. Talk to your care team if you wish to become pregnant or think you are pregnant. This medication can  cause serious birth defects if taken during pregnancy and for 3 months after the last dose. A reliable form of contraception is recommended while taking this medication and for 3 months after the last dose. Talk to your care team about effective forms of contraception. Do not breast-feed while taking this medication. What side effects may I notice from receiving this medication? Side effects that you should report to your care team as soon as possible: Allergic reactions--skin rash, itching, hives, swelling of the face, lips, tongue, or throat Infection--fever, chills, cough, sore throat, wounds that don't heal, pain or trouble when passing urine, general feeling of discomfort or being unwell Infusion reactions--chest pain, shortness of breath or trouble breathing, feeling faint or lightheaded Unusual bruising or bleeding Side effects that usually do not require medical attention (report to your care team if they continue or are bothersome): Constipation Diarrhea Fatigue Nausea Pain, tingling, or numbness in the hands or feet Swelling of the ankles, hands, or feet This list may not describe all possible side effects. Call your doctor for medical advice about side effects. You may report side effects to FDA at 1-800-FDA-1088. Where should I keep my medication? This medication is given in a hospital or clinic. It will not be stored at home. NOTE: This sheet is a summary. It may not cover all possible information. If you have questions about this medicine, talk to your doctor, pharmacist, or health care provider.  2024 Elsevier/Gold Standard (2022-07-16 00:00:00)  Bortezomib  Injection What is this medication? BORTEZOMIB  (bor TEZ oh mib) treats lymphoma. It may also be used to treat multiple myeloma, a type of bone marrow cancer. It works by blocking a protein that causes cancer cells to grow and multiply. This helps to slow or stop the spread of cancer cells. This medicine may be used for other  purposes; ask your health care provider or pharmacist if you have questions. COMMON BRAND NAME(S): BORUZU , Velcade  What should I tell my care team before I take this medication? They need to know if you have any of these conditions: Dehydration Diabetes Heart disease Liver disease Tingling of the fingers or toes or other nerve disorder An unusual or allergic reaction to bortezomib , other medications, foods, dyes, or preservatives If you or your partner are pregnant or trying to get pregnant Breastfeeding How should I use this medication? This medication is injected into a vein or under the skin. It is given by your care team in a hospital or clinic setting. Talk to your care team about the use of this medication in children. Special care may be needed. Overdosage: If you think you have taken too much of this medicine contact a poison control center or emergency room at once. NOTE: This medicine is only for you. Do not share this medicine with others. What if I miss a dose? Keep appointments for follow-up doses. It is important not to miss your dose. Call your care team if you are unable to keep an appointment. What may interact with this medication? Ketoconazole Rifampin This list may not describe all possible interactions. Give your health care provider a list of  all the medicines, herbs, non-prescription drugs, or dietary supplements you use. Also tell them if you smoke, drink alcohol, or use illegal drugs. Some items may interact with your medicine. What should I watch for while using this medication? Your condition will be monitored carefully while you are receiving this medication. You may need blood work while taking this medication. This medication may affect your coordination, reaction time, or judgment. Do not drive or operate machinery until you know how this medication affects you. Sit up or stand slowly to reduce the risk of dizzy or fainting spells. Drinking alcohol with this  medication can increase the risk of these side effects. This medication may increase your risk of getting an infection. Call your care team for advice if you get a fever, chills, sore throat, or other symptoms of a cold or flu. Do not treat yourself. Try to avoid being around people who are sick. Check with your care team if you have severe diarrhea, nausea, and vomiting, or if you sweat a lot. The loss of too much body fluid may make it dangerous for you to take this medication. Talk to your care team if you may be pregnant. Serious birth defects can occur if you take this medication during pregnancy and for 7 months after the last dose. You will need a negative pregnancy test before starting this medication. Contraception is recommended while taking this medication and for 7 months after the last dose. Your care team can help you find the option that works for you. If your partner can get pregnant, use a condom during sex while taking this medication and for 4 months after the last dose. Do not breastfeed while taking this medication and for 2 months after the last dose. This medication may cause infertility. Talk to your care team if you are concerned about your fertility. What side effects may I notice from receiving this medication? Side effects that you should report to your care team as soon as possible: Allergic reactions--skin rash, itching, hives, swelling of the face, lips, tongue, or throat Bleeding--bloody or black, tar-like stools, vomiting blood or brown material that looks like coffee grounds, red or dark brown urine, small red or purple spots on skin, unusual bruising or bleeding Bleeding in the brain--severe headache, stiff neck, confusion, dizziness, change in vision, numbness or weakness of the face, arm, or leg, trouble speaking, trouble walking, vomiting Bowel blockage--stomach cramping, unable to have a bowel movement or pass gas, loss of appetite, vomiting Heart  failure--shortness of breath, swelling of the ankles, feet, or hands, sudden weight gain, unusual weakness or fatigue Infection--fever, chills, cough, sore throat, wounds that don't heal, pain or trouble when passing urine, general feeling of discomfort or being unwell Liver injury--right upper belly pain, loss of appetite, nausea, light-colored stool, dark yellow or brown urine, yellowing skin or eyes, unusual weakness or fatigue Low blood pressure--dizziness, feeling faint or lightheaded, blurry vision Lung injury--shortness of breath or trouble breathing, cough, spitting up blood, chest pain, fever Pain, tingling, or numbness in the hands or feet Severe or prolonged diarrhea Stomach pain, bloody diarrhea, pale skin, unusual weakness or fatigue, decrease in the amount of urine, which may be signs of hemolytic uremic syndrome Sudden and severe headache, confusion, change in vision, seizures, which may be signs of posterior reversible encephalopathy syndrome (PRES) TTP--purple spots on the skin or inside the mouth, pale skin, yellowing skin or eyes, unusual weakness or fatigue, fever, fast or irregular heartbeat, confusion, change in vision, trouble  speaking, trouble walking Tumor lysis syndrome (TLS)--nausea, vomiting, diarrhea, decrease in the amount of urine, dark urine, unusual weakness or fatigue, confusion, muscle pain or cramps, fast or irregular heartbeat, joint pain Side effects that usually do not require medical attention (report to your care team if they continue or are bothersome): Constipation Diarrhea Fatigue Loss of appetite Nausea This list may not describe all possible side effects. Call your doctor for medical advice about side effects. You may report side effects to FDA at 1-800-FDA-1088. Where should I keep my medication? This medication is given in a hospital or clinic. It will not be stored at home. NOTE: This sheet is a summary. It may not cover all possible information.  If you have questions about this medicine, talk to your doctor, pharmacist, or health care provider.  2024 Elsevier/Gold Standard (2022-02-10 00:00:00)   To help prevent nausea and vomiting after your treatment, we encourage you to take your nausea medication as directed.  BELOW ARE SYMPTOMS THAT SHOULD BE REPORTED IMMEDIATELY: *FEVER GREATER THAN 100.4 F (38 C) OR HIGHER *CHILLS OR SWEATING *NAUSEA AND VOMITING THAT IS NOT CONTROLLED WITH YOUR NAUSEA MEDICATION *UNUSUAL SHORTNESS OF BREATH *UNUSUAL BRUISING OR BLEEDING *URINARY PROBLEMS (pain or burning when urinating, or frequent urination) *BOWEL PROBLEMS (unusual diarrhea, constipation, pain near the anus) TENDERNESS IN MOUTH AND THROAT WITH OR WITHOUT PRESENCE OF ULCERS (sore throat, sores in mouth, or a toothache) UNUSUAL RASH, SWELLING OR PAIN  UNUSUAL VAGINAL DISCHARGE OR ITCHING   Items with * indicate a potential emergency and should be followed up as soon as possible or go to the Emergency Department if any problems should occur.  Please show the CHEMOTHERAPY ALERT CARD or IMMUNOTHERAPY ALERT CARD at check-in to the Emergency Department and triage nurse.  Should you have questions after your visit or need to cancel or reschedule your appointment, please contact [facility] [place] - A DEPT OF [facility] [phone]  and follow the prompts.  Office hours are 8:00 a.m. to 4:30 p.m. Monday - Friday. Please note that voicemails left after 4:00 p.m. may not be returned until the following business day.  We are closed weekends and major holidays. You have access to a nurse at all times for urgent questions. Please call the main number to the clinic [phone] and follow the prompts.  For any non-urgent questions, you may also contact your provider using MyChart. We now offer e-Visits for anyone 5 and older to request care online for non-urgent symptoms. For details visit [email or web address].   Also  download the MyChart app! Go to the app store, search MyChart, open the app, select [place], and log in with your MyChart username and password.

## 2024-09-19 NOTE — Progress Notes (Signed)
 Patient presents today for chemotherapy injection of Darzalex  Faspro and Velcade . Patient reports flue like symptoms-body aches, chills, intermittent headaches, intermittent dizziness, and intermittent abd pain.  Vital signs are stable.  Labs reviewed are within treatment parameters.  Delon Hope NP made awrae of patient's complaints. Patient to get bolus of normal saline. Per Delon Hope NP patient okay to get treatment. We will proceed with treatment per MD orders.   Patient tolerated injections with no complaints voiced.  Site clean and dry with no bruising or swelling noted.  No complaints of pain.    Patient reports improvement in symptoms after premeds and IVF.  Patient tolerated treatment well with no complaints voiced.  Patient left via wheelchair in stable condition.  Vital signs stable at discharge.  Follow up as scheduled.

## 2024-09-25 ENCOUNTER — Other Ambulatory Visit: Payer: Self-pay

## 2024-09-25 NOTE — Progress Notes (Signed)
 Specialty Pharmacy Ongoing Clinical Assessment Note  Sara [name] is a 79 y.o. female who is being followed by the specialty pharmacy service for RxSp Oncology   Patient's specialty medication(s) reviewed today: cycloPHOSphamide  (CYTOXAN )   Missed doses in the last 4 weeks: 0   Patient/Caregiver did not have any additional questions or concerns.   Therapeutic benefit summary: Unable to assess   Adverse events/side effects summary: Experienced adverse events/side effects   Patient's therapy is appropriate to: Continue    Goals Addressed             This Visit's Progress    Slow Disease Progression       Patient is unable to be assessed as therapy was recently initiated. Patient will maintain adherence         Follow up: 3 months  Jacquelynn Friend M Muneeb Veras Specialty Pharmacist

## 2024-09-26 ENCOUNTER — Encounter: Payer: Self-pay | Admitting: Oncology

## 2024-09-26 ENCOUNTER — Inpatient Hospital Stay

## 2024-09-26 ENCOUNTER — Telehealth: Payer: Self-pay | Admitting: Pharmacy Technician

## 2024-09-26 ENCOUNTER — Inpatient Hospital Stay: Attending: Oncology

## 2024-09-26 ENCOUNTER — Other Ambulatory Visit (HOSPITAL_COMMUNITY): Payer: Self-pay

## 2024-09-26 VITALS — BP 110/59 | HR 65 | Temp 97.8°F | Resp 18 | Wt 229.6 lb

## 2024-09-26 DIAGNOSIS — Z86000 Personal history of in-situ neoplasm of breast: Secondary | ICD-10-CM | POA: Insufficient documentation

## 2024-09-26 DIAGNOSIS — Z9011 Acquired absence of right breast and nipple: Secondary | ICD-10-CM | POA: Insufficient documentation

## 2024-09-26 DIAGNOSIS — Z85528 Personal history of other malignant neoplasm of kidney: Secondary | ICD-10-CM | POA: Diagnosis not present

## 2024-09-26 DIAGNOSIS — R5383 Other fatigue: Secondary | ICD-10-CM | POA: Insufficient documentation

## 2024-09-26 DIAGNOSIS — G629 Polyneuropathy, unspecified: Secondary | ICD-10-CM | POA: Insufficient documentation

## 2024-09-26 DIAGNOSIS — E8581 Light chain (AL) amyloidosis: Secondary | ICD-10-CM

## 2024-09-26 DIAGNOSIS — R2241 Localized swelling, mass and lump, right lower limb: Secondary | ICD-10-CM | POA: Diagnosis not present

## 2024-09-26 LAB — MAGNESIUM: Magnesium: 2.6 mg/dL — ABNORMAL HIGH (ref 1.7–2.4)

## 2024-09-26 LAB — COMPREHENSIVE METABOLIC PANEL WITH GFR
ALT: 17 U/L (ref 0–44)
AST: 26 U/L (ref 15–41)
Albumin: 2.8 g/dL — ABNORMAL LOW (ref 3.5–5.0)
Alkaline Phosphatase: 94 U/L (ref 38–126)
Anion gap: 11 (ref 5–15)
BUN: 11 mg/dL (ref 8–23)
CO2: 23 mmol/L (ref 22–32)
Calcium: 8.1 mg/dL — ABNORMAL LOW (ref 8.9–10.3)
Chloride: 104 mmol/L (ref 98–111)
Creatinine, Ser: 1.01 mg/dL — ABNORMAL HIGH (ref 0.44–1.00)
GFR, Estimated: 57 mL/min — ABNORMAL LOW
Glucose, Bld: 67 mg/dL — ABNORMAL LOW (ref 70–99)
Potassium: 4 mmol/L (ref 3.5–5.1)
Sodium: 138 mmol/L (ref 135–145)
Total Bilirubin: 0.3 mg/dL (ref 0.0–1.2)
Total Protein: 5.1 g/dL — ABNORMAL LOW (ref 6.5–8.1)

## 2024-09-26 LAB — CBC WITH DIFFERENTIAL/PLATELET
Abs Immature Granulocytes: 0.03 K/uL (ref 0.00–0.07)
Basophils Absolute: 0 K/uL (ref 0.0–0.1)
Basophils Relative: 1 %
Eosinophils Absolute: 0 K/uL (ref 0.0–0.5)
Eosinophils Relative: 0 %
HCT: 40.8 % (ref 36.0–46.0)
Hemoglobin: 13.5 g/dL (ref 12.0–15.0)
Immature Granulocytes: 1 %
Lymphocytes Relative: 24 %
Lymphs Abs: 0.7 K/uL (ref 0.7–4.0)
MCH: 32.4 pg (ref 26.0–34.0)
MCHC: 33.1 g/dL (ref 30.0–36.0)
MCV: 97.8 fL (ref 80.0–100.0)
Monocytes Absolute: 0.4 K/uL (ref 0.1–1.0)
Monocytes Relative: 13 %
Neutro Abs: 1.8 K/uL (ref 1.7–7.7)
Neutrophils Relative %: 61 %
Platelets: 186 K/uL (ref 150–400)
RBC: 4.17 MIL/uL (ref 3.87–5.11)
RDW: 15.3 % (ref 11.5–15.5)
WBC: 2.9 K/uL — ABNORMAL LOW (ref 4.0–10.5)
nRBC: 0 % (ref 0.0–0.2)

## 2024-09-26 MED ORDER — DEXAMETHASONE 4 MG PO TABS
40.0000 mg | ORAL_TABLET | Freq: Once | ORAL | Status: AC
  Administered 2024-09-26: 40 mg via ORAL
  Filled 2024-09-26: qty 10

## 2024-09-26 MED ORDER — ACETAMINOPHEN 325 MG PO TABS
650.0000 mg | ORAL_TABLET | Freq: Once | ORAL | Status: AC
  Administered 2024-09-26: 650 mg via ORAL
  Filled 2024-09-26: qty 2

## 2024-09-26 MED ORDER — DARATUMUMAB-HYALURONIDASE-FIHJ 1800-30000 MG-UT/15ML ~~LOC~~ SOLN
1800.0000 mg | Freq: Once | SUBCUTANEOUS | Status: AC
  Administered 2024-09-26: 1800 mg via SUBCUTANEOUS
  Filled 2024-09-26: qty 15

## 2024-09-26 MED ORDER — PROCHLORPERAZINE MALEATE 10 MG PO TABS: 10.0000 mg | ORAL_TABLET | Freq: Four times a day (QID) | ORAL | 2 refills | Status: AC | PRN

## 2024-09-26 MED ORDER — DIPHENHYDRAMINE HCL 25 MG PO TABS
25.0000 mg | ORAL_TABLET | Freq: Once | ORAL | Status: AC
  Administered 2024-09-26: 25 mg via ORAL
  Filled 2024-09-26: qty 1

## 2024-09-26 MED ORDER — BORTEZOMIB CHEMO SQ INJECTION 3.5 MG (2.5MG/ML)
1.3000 mg/m2 | Freq: Once | INTRAMUSCULAR | Status: DC
  Filled 2024-09-26: qty 1.1

## 2024-09-26 MED ORDER — ONDANSETRON HCL 8 MG PO TABS: ORAL_TABLET | ORAL | 1 refills | Status: AC

## 2024-09-26 NOTE — Progress Notes (Signed)
 Patient presents today for Drartumumab injection per providers order.  Vital signs and labs within parameters for injection.  Daratumumab  administration without incident; injection site WNL; see MAR for injection details.  Patient tolerated procedure well and without incident.  No questions or complaints noted at this time.

## 2024-09-26 NOTE — Progress Notes (Signed)
 Orders received to remove bortezomib  subcutaneous from Day 22 of each chemotherapy cycle.  Plan updated.  V.O. Dr Ivery Molt, PharmD

## 2024-09-26 NOTE — Patient Instructions (Signed)
 [facility] [place] [facility] - [facility]PT OF [facility]  Discharge Instructions: Thank you for choosing [facility] to provide your oncology and hematology care.  If you have a lab appointment with the Cancer Center - please note that after April 8th, 2024, all labs will be drawn in the cancer center.  You do not have to check in or register with the main entrance as you have in the past but will complete your check-in in the cancer center.  Wear comfortable clothing and clothing appropriate for easy access to any Portacath or PICC line.   We strive to give you quality time with your provider. You may need to reschedule your appointment if you arrive late (15 or more minutes).  Arriving late affects you and other patients whose appointments are after yours.  Also, if you miss three or more appointments without notifying the office, you may be dismissed from the clinic at the provider's discretion.      For prescription refill requests, have your pharmacy contact our office and allow 72 hours for refills to be completed.    Today you received the following chemotherapy and/or immunotherapy agents: Daratumumab       To help prevent nausea and vomiting after your treatment, we encourage you to take your nausea medication as directed.  BELOW ARE SYMPTOMS THAT SHOULD BE REPORTED IMMEDIATELY: *FEVER GREATER THAN 100.4 F (38 C) OR HIGHER *CHILLS OR SWEATING *NAUSEA AND VOMITING THAT IS NOT CONTROLLED WITH YOUR NAUSEA MEDICATION *UNUSUAL SHORTNESS OF BREATH *UNUSUAL BRUISING OR BLEEDING *URINARY PROBLEMS (pain or burning when urinating, or frequent urination) *BOWEL PROBLEMS (unusual diarrhea, constipation, pain near the anus) TENDERNESS IN MOUTH AND THROAT WITH OR WITHOUT PRESENCE OF ULCERS (sore throat, sores in mouth, or a toothache) UNUSUAL RASH, SWELLING OR PAIN  UNUSUAL VAGINAL DISCHARGE OR ITCHING   Items with * indicate a potential emergency and should be  followed up as soon as possible or go to the Emergency Department if any problems should occur.  Please show the CHEMOTHERAPY ALERT CARD or IMMUNOTHERAPY ALERT CARD at check-in to the Emergency Department and triage nurse.  Should you have questions after your visit or need to cancel or reschedule your appointment, please contact [facility] [place] - A DEPT OF [facility] [phone]  and follow the prompts.  Office hours are 8:00 a.m. to 4:30 p.m. Monday - Friday. Please note that voicemails left after 4:00 p.m. may not be returned until the following business day.  We are closed weekends and major holidays. You have access to a nurse at all times for urgent questions. Please call the main number to the clinic [phone] and follow the prompts.  For any non-urgent questions, you may also contact your provider using MyChart. We now offer e-Visits for anyone 71 and older to request care online for non-urgent symptoms. For details visit [email or web address].   Also download the MyChart app! Go to the app store, search "MyChart", open the app, select [place], and log in with your MyChart username and password.

## 2024-09-26 NOTE — Telephone Encounter (Signed)
 Oral Oncology Patient Advocate Encounter  Was successful in securing patient a grant from [employer] to provide copayment coverage for cyclophosphamide .  This will keep the out of pocket expense at $0.     The billing information is as follows and has been shared with [facility].    RxBin: [id number] PCN: [id number] Member ID: [id number] Group ID: [id number] Dates of Eligibility: 09/21/2024 through 09/20/2025  Fund:  AL    Sara [name] (Patty) Chet Burnet, CPhT  [facility], [place] [place], [facility] - Oral Chemotherapy Patient Advocate Specialist III Phone: [phone]  Fax: [phone]

## 2024-09-27 ENCOUNTER — Other Ambulatory Visit: Payer: Self-pay

## 2024-09-27 ENCOUNTER — Other Ambulatory Visit (HOSPITAL_COMMUNITY): Payer: Self-pay

## 2024-09-27 ENCOUNTER — Encounter: Payer: Self-pay | Admitting: Oncology

## 2024-09-27 NOTE — Telephone Encounter (Signed)
 Oral Oncology Patient Advocate Encounter  Called foundation to ask why grant was not going through. Per foundation representative, the grant does not cover cytoxan .  Tanylah Schnoebelen (Patty) Chet Burnet, CPhT  [facility], [facility] [place], [facility] - Oral Chemotherapy Patient Advocate Specialist III Phone: [phone]  Fax: [phone]

## 2024-09-28 ENCOUNTER — Other Ambulatory Visit: Payer: Self-pay

## 2024-09-28 ENCOUNTER — Other Ambulatory Visit (HOSPITAL_COMMUNITY): Payer: Self-pay

## 2024-09-28 ENCOUNTER — Encounter: Payer: Self-pay | Admitting: Oncology

## 2024-09-29 ENCOUNTER — Telehealth: Payer: Self-pay | Admitting: Pharmacy Technician

## 2024-09-29 ENCOUNTER — Other Ambulatory Visit (HOSPITAL_COMMUNITY): Payer: Self-pay

## 2024-09-29 ENCOUNTER — Other Ambulatory Visit: Payer: Self-pay

## 2024-09-29 ENCOUNTER — Encounter: Payer: Self-pay | Admitting: Oncology

## 2024-09-29 NOTE — Progress Notes (Signed)
 Specialty Pharmacy Refill Coordination Note  Sara [name] is a 79 y.o. female contacted today regarding refills of specialty medication(s) cycloPHOSphamide  (CYTOXAN )   Patient requested Delivery   Delivery date: 10/02/24   Verified address: [address] [place] [address]   Medication will be filled on: 09/29/24

## 2024-09-29 NOTE — Telephone Encounter (Signed)
 Oral Oncology Patient Advocate Encounter  Was successful in securing patient a $8000 grant from [facility] to provide copayment coverage for cyclophosphamide .  This will keep the out of pocket expense at $0.     [employer] ID: [id number]   The billing information is as follows and has been shared with [facility].    RxBin: [id number] PCN: [id number] Member ID: [id number] Group ID: [id number] Dates of Eligibility: 08/29/2024 through 08/28/2025  Fund:  Amyloidosis  Nataly Pacifico (Patty) Chet Burnet, CPhT  [facility], [facility] [place], [facility] - Oral Chemotherapy Patient Advocate Specialist III Phone: [phone]  Fax: [phone]

## 2024-10-03 ENCOUNTER — Inpatient Hospital Stay: Admitting: Oncology

## 2024-10-03 ENCOUNTER — Inpatient Hospital Stay

## 2024-10-03 ENCOUNTER — Other Ambulatory Visit: Payer: Self-pay

## 2024-10-03 ENCOUNTER — Other Ambulatory Visit (HOSPITAL_COMMUNITY): Payer: Self-pay

## 2024-10-03 VITALS — BP 95/58 | HR 66 | Temp 96.9°F | Resp 18 | Wt 228.2 lb

## 2024-10-03 VITALS — BP 85/58 | HR 65 | Temp 99.2°F | Resp 16

## 2024-10-03 DIAGNOSIS — R5383 Other fatigue: Secondary | ICD-10-CM

## 2024-10-03 DIAGNOSIS — E8581 Light chain (AL) amyloidosis: Secondary | ICD-10-CM | POA: Diagnosis not present

## 2024-10-03 DIAGNOSIS — C649 Malignant neoplasm of unspecified kidney, except renal pelvis: Secondary | ICD-10-CM

## 2024-10-03 DIAGNOSIS — D0511 Intraductal carcinoma in situ of right breast: Secondary | ICD-10-CM

## 2024-10-03 LAB — CBC WITH DIFFERENTIAL/PLATELET
Abs Immature Granulocytes: 0.05 K/uL (ref 0.00–0.07)
Basophils Absolute: 0 K/uL (ref 0.0–0.1)
Basophils Relative: 1 %
Eosinophils Absolute: 0 K/uL (ref 0.0–0.5)
Eosinophils Relative: 0 %
HCT: 39.7 % (ref 36.0–46.0)
Hemoglobin: 13.2 g/dL (ref 12.0–15.0)
Immature Granulocytes: 2 %
Lymphocytes Relative: 12 %
Lymphs Abs: 0.4 K/uL — ABNORMAL LOW (ref 0.7–4.0)
MCH: 32.2 pg (ref 26.0–34.0)
MCHC: 33.2 g/dL (ref 30.0–36.0)
MCV: 96.8 fL (ref 80.0–100.0)
Monocytes Absolute: 0.2 K/uL (ref 0.1–1.0)
Monocytes Relative: 6 %
Neutro Abs: 2.7 K/uL (ref 1.7–7.7)
Neutrophils Relative %: 79 %
Platelets: 169 K/uL (ref 150–400)
RBC: 4.1 MIL/uL (ref 3.87–5.11)
RDW: 15.5 % (ref 11.5–15.5)
WBC: 3.3 K/uL — ABNORMAL LOW (ref 4.0–10.5)
nRBC: 0 % (ref 0.0–0.2)

## 2024-10-03 LAB — COMPREHENSIVE METABOLIC PANEL WITH GFR
ALT: 15 U/L (ref 0–44)
AST: 26 U/L (ref 15–41)
Albumin: 2.8 g/dL — ABNORMAL LOW (ref 3.5–5.0)
Alkaline Phosphatase: 109 U/L (ref 38–126)
Anion gap: 12 (ref 5–15)
BUN: 13 mg/dL (ref 8–23)
CO2: 24 mmol/L (ref 22–32)
Calcium: 8.1 mg/dL — ABNORMAL LOW (ref 8.9–10.3)
Chloride: 103 mmol/L (ref 98–111)
Creatinine, Ser: 1.16 mg/dL — ABNORMAL HIGH (ref 0.44–1.00)
GFR, Estimated: 48 mL/min — ABNORMAL LOW
Glucose, Bld: 86 mg/dL (ref 70–99)
Potassium: 3.9 mmol/L (ref 3.5–5.1)
Sodium: 139 mmol/L (ref 135–145)
Total Bilirubin: 0.4 mg/dL (ref 0.0–1.2)
Total Protein: 5 g/dL — ABNORMAL LOW (ref 6.5–8.1)

## 2024-10-03 LAB — MAGNESIUM: Magnesium: 2.6 mg/dL — ABNORMAL HIGH (ref 1.7–2.4)

## 2024-10-03 MED ORDER — SODIUM CHLORIDE 0.9 % IV SOLN: INTRAVENOUS | Status: DC

## 2024-10-03 MED ORDER — CYCLOPHOSPHAMIDE 50 MG PO CAPS
500.0000 mg | ORAL_CAPSULE | ORAL | 3 refills | Status: AC
  Filled 2024-10-03 – 2024-10-20 (×2): qty 40, 28d supply, fill #0

## 2024-10-03 MED ORDER — DARATUMUMAB-HYALURONIDASE-FIHJ 1800-30000 MG-UT/15ML ~~LOC~~ SOLN
1800.0000 mg | Freq: Once | SUBCUTANEOUS | Status: AC
  Administered 2024-10-03: 1800 mg via SUBCUTANEOUS
  Filled 2024-10-03: qty 15

## 2024-10-03 MED ORDER — PALONOSETRON HCL INJECTION 0.25 MG/5ML
0.2500 mg | Freq: Once | INTRAVENOUS | Status: AC
  Administered 2024-10-03: 0.25 mg via INTRAVENOUS
  Filled 2024-10-03: qty 5

## 2024-10-03 MED ORDER — BORTEZOMIB CHEMO SQ INJECTION 3.5 MG (2.5MG/ML)
1.3000 mg/m2 | Freq: Once | INTRAMUSCULAR | Status: AC
  Administered 2024-10-03: 2.75 mg via SUBCUTANEOUS
  Filled 2024-10-03: qty 1.1

## 2024-10-03 MED ORDER — DIPHENHYDRAMINE HCL 25 MG PO TABS
25.0000 mg | ORAL_TABLET | Freq: Once | ORAL | Status: AC
  Administered 2024-10-03: 25 mg via ORAL
  Filled 2024-10-03: qty 1

## 2024-10-03 MED ORDER — ACETAMINOPHEN 325 MG PO TABS
650.0000 mg | ORAL_TABLET | Freq: Once | ORAL | Status: AC
  Administered 2024-10-03: 650 mg via ORAL
  Filled 2024-10-03: qty 2

## 2024-10-03 MED ORDER — SODIUM CHLORIDE 0.9 % IV SOLN
500.0000 mg | Freq: Once | INTRAVENOUS | Status: AC
  Administered 2024-10-03: 500 mg via INTRAVENOUS
  Filled 2024-10-03: qty 25

## 2024-10-03 MED ORDER — DEXAMETHASONE 4 MG PO TABS
40.0000 mg | ORAL_TABLET | Freq: Once | ORAL | Status: AC
  Administered 2024-10-03: 40 mg via ORAL
  Filled 2024-10-03: qty 10

## 2024-10-03 NOTE — Progress Notes (Signed)
Patient tolerated chemotherapy with no complaints voiced.  Side effects with management reviewed with understanding verbalized.  Peripheral IV site clean and dry with no bruising or swelling noted at site.  Good blood return noted before and after administration of chemotherapy.  Band aid applied.  Patient left in satisfactory condition with VSS and no s/s of distress noted.

## 2024-10-03 NOTE — Progress Notes (Signed)
 PGY2 Oncology Pharmacy Resident Intervention  History Patient presented for treatment of Light Chain Amyloidosis: Velcade , Cyclophosphamide , Dexamethasone  and Daratumumab   Patient was to start their oral cyclophosphamide  dose today, but due to delivery delays they did not receive their medication in time. Pharmacy was asked to add IV cyclophosphamide  to their treatment plan for today.  Asked by infusion pharmacist to 2nd verify cyclophosphamide  IV dose entry of 640 mg to be given with Velcade   Checked for appropriate sequencing and dosing  Unlike other indications, cyclophosphamide  weekly dosing (300 mg/m^2) is uniquely capped at 500 mg for light chain amyloidosis  Actions Patient was dosed at 650 mg (300 mg/m^2) which is above the recommended dose cap  Clarified dosing with oral chemo pharmacist and infusion pharmacist  Recommendations Reached out to the provider to enquire if they wanted to proceed at current dosing or to adjust based on study protocol   Study: https://www.nejm.org/doi/full/10.1056/NEJMoa2028631 Study Protocol: https://www.nejm.org/doi/suppl/10.1056/NEJMoa2028631/suppl_file/nejmoa2028631_protocol.pdf  Outcome Patient's dose was adjusted to reflect cyclophosphamide  500 mg weekly Since the patient was at [facility] receiving their infusion, onsite pharmacist will discuss the dose change in person with the patient  Thank you for allowing pharmacy to participate in this patient's care.  Alfonso MARLA Buys, PharmD Pharmacy Resident  10/03/2024 11:28 AM

## 2024-10-03 NOTE — Progress Notes (Signed)
 Patients oral cyclophosphamide  has not arrived at her home.  Checking on delivery.  Dr Davonna for today adding:  Cyclophosphamide  500 mg IVPB x 1 based on NCCN guidelines for amyloidosis. Aloxi  0.25 mg IVP as premedication.  V.O. Dr Ivery Molt, PharmD

## 2024-10-03 NOTE — Progress Notes (Signed)
 " Patient Care Team: [name], MD as PCP - General (Family Medicine) Burnard Debby LABOR, MD (Inactive) as PCP - Cardiology (Cardiology) Cindie Ole DASEN, MD (Inactive) as PCP - Electrophysiology (Cardiology) Shaaron Lamar HERO, MD (Gastroenterology) Delford Maude BROCKS, MD as Attending Physician (Cardiology) Davonna Siad, MD as Medical Oncologist (Medical Oncology) Celestia Joesph SQUIBB, RN as Oncology Nurse Navigator (Medical Oncology)  Clinic Day:  08/23/2024  Referring physician: [name], MD   CHIEF COMPLAINT:  CC: Renal amyloidosis (AL type)   ASSESSMENT & PLAN:   Assessment & Plan: Sara [name]  is a 79 y.o. female with newly diagnosed AL amyloidosis  Assessment and Plan  Light chain (AL) amyloidosis Diagnosis confirmed by kidney biopsy showing AL amyloid deposits. PET scan: Negative for lytic bone lesions Cardiac echo: Normal LVEF Elevated LDH, beta-2  microglobulin Bone marrow biopsy: No evidence of myeloma, 5% plasma cells by cellularity Elevated free light chains with slightly elevated ratio and serum and normal ratio in urine.  Total urine protein greater than 5 g.  IFE: IgA monoclonal protein with lambda light chain specificity Evaluated by Dr.Lambird at [facility]. Recommended Dara-VCD with no role for autologous stem cell transplant for patients over 70 years Cardiac MRI showed no evidence of amyloidosis   -Cycle 2-day 1 today.  Patient reports fatigue but no other significant side effects - Labs reviewed today: CMP: Creatinine: 1.16, calcium : 8.1, normal LFTs.  CBC: WBC: 3.3, hemoglobin and platelets are normal. -Physical exam stable today.  Will proceed with treatment today. - Monitor urine protein levels every other month.  Ordered for next time. -Continue Dara VCD as scheduled. - Will do Velcade  weekly with omission of Day 22 dose for better tolerance and decreased incidence of peripheral neuropathy.   Return to clinic with cycle 3 to  assess for tolerance  Chest pain and shortness of breath Intermittent chest pain relieved by nitroglycerin . Shortness of breath noted. Cardiologist consultation pending at this time Improved some on this visit.  Ductal carcinoma in situ (DCIS) of right breast Diagnosed in December 2015 due to abnormal screening mammogram S/p right simple mastectomy on 11/20/2014, lymph node biopsy negative.  Grade 2, 10 cm, ER: Positive, 100%, PR: positive, 90%. Completed 5 years of tamoxifen .  She stopped the tamoxifen  on 01/19/2020.   - Continue yearly mammograms   Renal cell carcinoma, unspecified laterality  She had a T1a Nx RCC which was treated with partial right nephrectomy (01/08/2016). Last CT scan on 10/01/2017 showed no recurrence.  Hepatic steatosis was seen. She had a repeat CT scan in January 2021 which was reportedly normal.   Completed 5 years of surveillance with Dr.Manny  Fatigue Likely secondary to chemotherapy No recent weight loss will continue to monitor at this time  The patient understands the plans discussed today and is in agreement with them.  She knows to contact our office if she develops concerns prior to her next appointment.  30 minutes of total time was spent for this patient encounter, including preparation,face-to-face counseling with the patient and coordination of care, physical exam, and documentation of the encounter.    LILLETTE Verneta SAUNDERS Teague,acting as a neurosurgeon for Siad Davonna, MD.,have documented all relevant documentation on the behalf of Siad Davonna, MD,as directed by  Siad Davonna, MD while in the presence of Siad Davonna, MD.  I, Siad Davonna MD, have reviewed the above documentation for accuracy and completeness, and I agree with the above.    Siad Davonna, MD  [facility] [place] - A DEPT OF [facility] [address] [place] [address] Dept: [phone] Dept Fax: [phone]    Orders Placed This Encounter  Procedures   24 hr, Ur UPEP/UIFE/Light Chains/TP    Standing Status:   Future    Expected Date:   10/10/2024    Expiration Date:   10/03/2025     ONCOLOGY HISTORY:   Diagnosis: Renal amyloidosis   -07/12/2023: FKLC:25.3, FLLC:61.5, Ratio: 2.43. SPEP: M spike:0.7, IFE: not available -03/29/2024: Urine protein/creat ratio: 7385 -04/24/2024: Renal function panel: Creatinine 1.15. Calcium  8.5. Albumin  2.6.  -04/24/2024: Hepatitis B and Hepatitis C negative. HIV negative.  -04/28/2024: Kidney biopsy.              Pathology: Lambda light chain AL renal amyloidosis. Moderate intersitial fibrosis and tubular atrophy (IFTA 30-40%) with 30% obsolescent glomeruli (6/20 glomeruli).  -05/31/2024: SPEP: M spike: 0.7, IFE: IgA monoclonal protein with lambda light chain specificity, beta-2  microglobulin: 3.8, kappa free light chain: 34, lambda free light chain: 86.9, ratio: 2.5 -05/31/2024: Flow cytometry: No abnormal B or T cells identified - 06/02/2024: Urine free light chains: Kappa free light chains: 92.42, lambda free light chains: 69.35, ratio: 1.3, IFE: IgA monoclonal protein with lambda light chain specificity, Bence-Jones proteins positive.  Total protein: 5574 mg/24-hour -06/15/2024: NM PET Image Initial (PI) Whole body:  No evidence of multiple myeloma on whole-body FDG PET scan. No evidence of breast cancer recurrence. No renal abnormality PET-CT scan. -06/29/2024: Bone marrow biopsy:             -Mildly hypercellular bone marrow (50%) involved by plasma cell neoplasm (6% plasma cells by manual aspirate differential, approximately 5% by  CD138 immunohistochemical analysis, and lambda monotypic by kappa/lambda in situ hybridization).              -Cytogenetics: Normal female karyotype - Multiple myeloma FISH: Normal -Negative for amyloid with congo red staining -06/28/2024: Echo: LVEF: 55-60% -08/15/2024: High sensitive troponin: 8- normal, NT-proBNP: 240  (elevated), elavated IgA, Normal IgM and low IgG.  -08/15/2024: Evaluated by [facility]ambird at [facility]. No role for autologous stem cell transplant (age >21).  -09/05/2024-Current: Dara-VCD -09/18/2024: Cardiac MRI: No LGE to suggest amyloid or other inflammatory/infiltrative process. Preserved biventricular resting systolic function (LVEF 63%, RVEF 52%).  Mild tricuspid regurgitation with mildly enlarged RA.   Current Treatment: Dara-VCD  INTERVAL HISTORY:   Nilsa B Stehlin is here today for follow-up of Renal amyloidosis (AL type), right breast DCIS, and RCC. She is accompanied by her husband today.   She reports feeling well overall. She notes constant fatigue due to treatment and has been sleeping more. Her appetite has also decreased. Aunna has not received cytoxan  pills for her second cycle of treatment to take at home. We will give it in clinic today.   Her husband states Jersie has gained 12 pounds in the last few weeks, which her husband believes may be due to fluid retention.   Her peripheral neuropathy in the form of numbness and tingling in the feet is stable. We discussed cardiac MRI results, which did not show amyloid involvement.   I have reviewed the past medical history, past surgical history, social history and family history with the patient and they are unchanged from previous note.  ALLERGIES:  is allergic to propoxyphene hcl, amlodipine , bee pollen, pollen extract, camphor, dust mite extract, latex, molds & smuts, and tomato.  MEDICATIONS:  Current Outpatient Medications  Medication Sig Dispense  Refill   acetaminophen  (TYLENOL ) 500 MG tablet Take 650 mg by mouth as needed for moderate pain or headache.     acyclovir  (ZOVIRAX ) 400 MG tablet Take 1 tablet (400 mg total) by mouth 2 (two) times daily. 60 tablet 5   aspirin  EC 81 MG tablet Take 81 mg by mouth daily. Swallow whole.     atorvastatin  (LIPITOR) 40 MG tablet Take 40 mg by mouth daily.     dapagliflozin  propanediol (FARXIGA) 10 MG TABS tablet Take 10 mg by mouth.     gabapentin  (NEURONTIN ) 600 MG tablet TAKE 1 TABLET BY MOUTH TWICE  DAILY 200 tablet 2   levothyroxine  (SYNTHROID ) 150 MCG tablet Take 1 tablet (150 mcg total) by mouth daily.     lidocaine -prilocaine  (EMLA ) cream Apply to affected area once 30 g 3   Magnesium  400 MG CAPS Take 400 mg by mouth daily.     metoprolol  succinate (TOPROL  XL) 25 MG 24 hr tablet Take 1 1/2 tablet in the morning (37.5 mg) and take 1/2 (12.5mg ) tablet at night. 180 tablet 3   moxifloxacin (VIGAMOX) 0.5 % ophthalmic solution Apply 1 drop to eye.     Multiple Vitamins-Minerals (CENTRUM SILVER PO) Take 1 tablet by mouth daily.     nitroGLYCERIN  (NITROSTAT ) 0.4 MG SL tablet Place 1 tablet (0.4 mg total) under the tongue every 5 (five) minutes as needed for chest pain. 25 tablet 3   omeprazole (PRILOSEC) 20 MG capsule Take 20 mg by mouth daily. May take a second 20 mg dose as needed for heartburn     ondansetron  (ZOFRAN ) 8 MG tablet Take 8 mg by mouth 30 to 60 min prior to Cyclophosphamide  administration then take 8 mg every 8 hrs as needed for nausea and vomiting. 45 tablet 1   potassium chloride  (KLOR-CON ) 20 MEQ packet Take 20 mEq by mouth 2 (two) times daily. 180 packet 3   prochlorperazine  (COMPAZINE ) 10 MG tablet Take 1 tablet (10 mg total) by mouth every 6 (six) hours as needed. 60 tablet 2   torsemide  (DEMADEX ) 20 MG tablet Take 3 tablets (60 mg total) by mouth daily. 270 tablet 3   Vibegron (GEMTESA) 75 MG TABS Take 75 mg by mouth daily.     Vitamin D , Ergocalciferol , (DRISDOL ) 1.25 MG (50000 UNIT) CAPS capsule Take 1 capsule (50,000 Units total) by mouth every 14 (fourteen) days. 6 capsule 3   cyclophosphamide  (CYTOXAN ) 50 MG capsule Take 10 capsules (500 mg total) by mouth once a week. Take with breakfast. 40 capsule 3   No current facility-administered medications for this visit.   Facility-Administered Medications Ordered in Other Visits  Medication  Dose Route Frequency Provider Last Rate Last Admin   0.9 %  sodium chloride  infusion   Intravenous Continuous Ranesha Val, MD 10 mL/hr at 10/03/24 1001 New Bag at 10/03/24 1001   0.9 %  sodium chloride  infusion   Intravenous Continuous Shia Delaine, MD 500 mL/hr at 10/03/24 1019 New Bag at 10/03/24 1019     VITALS:  Blood pressure (!) 85/58, pulse 65, temperature 99.2 F (37.3 C), temperature source Oral, resp. rate 16, SpO2 99%.  Wt Readings from Last 3 Encounters:  10/03/24 228 lb 2.8 oz (103.5 kg)  09/26/24 229 lb 9.6 oz (104.1 kg)  09/19/24 222 lb 3.2 oz (100.8 kg)    There is no height or weight on file to calculate BMI.  Performance status (ECOG): 1 - Symptomatic but completely ambulatory  PHYSICAL EXAM:   GENERAL:alert,  no distress and comfortable SKIN: skin color, texture, turgor are normal, no rashes or significant lesions LYMPH:  no palpable lymphadenopathy in the cervical, axillary or inguinal LUNGS: clear to auscultation and percussion with normal breathing effort HEART: regular rate & rhythm and no murmurs and no lower extremity edema ABDOMEN:abdomen soft, non-tender and normal bowel sounds Musculoskeletal:no cyanosis of digits and no clubbing  NEURO: alert & oriented x 3 with fluent speech  LABORATORY DATA:  I have reviewed the data as listed  Lab Results  Component Value Date   WBC 3.3 (L) 10/03/2024   HGB 13.2 10/03/2024   HCT 39.7 10/03/2024   MCV 96.8 10/03/2024   PLT 169 10/03/2024      Chemistry      Component Value Date/Time   NA 139 10/03/2024 0829   NA 145 (H) 03/08/2024 1526   K 3.9 10/03/2024 0829   K 4.2 08/19/2011 0826   CL 103 10/03/2024 0829   CL 101 08/19/2011 0826   CO2 24 10/03/2024 0829   BUN 13 10/03/2024 0829   BUN 20 03/08/2024 1526   CREATININE 1.16 (H) 10/03/2024 0829   CREATININE 0.72 06/07/2020 0833      Component Value Date/Time   CALCIUM  8.1 (L) 10/03/2024 0829   CALCIUM  9.4 08/19/2011 0826   ALKPHOS 109  10/03/2024 0829   ALKPHOS 98 08/19/2011 0826   AST 26 10/03/2024 0829   AST 30 08/19/2011 0826   ALT 15 10/03/2024 0829   BILITOT 0.4 10/03/2024 0829   BILITOT 0.4 12/26/2021 1502   BILITOT 0.5 05/13/2011 0831      RADIOGRAPHIC STUDIES: I have personally reviewed the radiological images as listed and agreed with the findings in the report.  "

## 2024-10-03 NOTE — Patient Instructions (Signed)
 [facility] [place] - A DEPT OF [facility]  Discharge Instructions: Thank you for choosing [facility] to provide your oncology and hematology care.  If you have a lab appointment with the Cancer Center - please note that after April 8th, 2024, all labs will be drawn in the cancer center.  You do not have to check in or register with the main entrance as you have in the past but will complete your check-in in the cancer center.  Wear comfortable clothing and clothing appropriate for easy access to any Portacath or PICC line.   We strive to give you quality time with your provider. You may need to reschedule your appointment if you arrive late (15 or more minutes).  Arriving late affects you and other patients whose appointments are after yours.  Also, if you miss three or more appointments without notifying the office, you may be dismissed from the clinic at the providers discretion.      For prescription refill requests, have your pharmacy contact our office and allow 72 hours for refills to be completed.    Today you received the following chemotherapy and/or immunotherapy agents Cytoxan , Darzalex , Velcade .       To help prevent nausea and vomiting after your treatment, we encourage you to take your nausea medication as directed.  BELOW ARE SYMPTOMS THAT SHOULD BE REPORTED IMMEDIATELY: *FEVER GREATER THAN 100.4 F (38 C) OR HIGHER *CHILLS OR SWEATING *NAUSEA AND VOMITING THAT IS NOT CONTROLLED WITH YOUR NAUSEA MEDICATION *UNUSUAL SHORTNESS OF BREATH *UNUSUAL BRUISING OR BLEEDING *URINARY PROBLEMS (pain or burning when urinating, or frequent urination) *BOWEL PROBLEMS (unusual diarrhea, constipation, pain near the anus) TENDERNESS IN MOUTH AND THROAT WITH OR WITHOUT PRESENCE OF ULCERS (sore throat, sores in mouth, or a toothache) UNUSUAL RASH, SWELLING OR PAIN  UNUSUAL VAGINAL DISCHARGE OR ITCHING   Items with * indicate a potential emergency and  should be followed up as soon as possible or go to the Emergency Department if any problems should occur.  Please show the CHEMOTHERAPY ALERT CARD or IMMUNOTHERAPY ALERT CARD at check-in to the Emergency Department and triage nurse.  Should you have questions after your visit or need to cancel or reschedule your appointment, please contact [facility] [place] - A DEPT OF [facility] [phone]  and follow the prompts.  Office hours are 8:00 a.m. to 4:30 p.m. Monday - Friday. Please note that voicemails left after 4:00 p.m. may not be returned until the following business day.  We are closed weekends and major holidays. You have access to a nurse at all times for urgent questions. Please call the main number to the clinic [phone] and follow the prompts.  For any non-urgent questions, you may also contact your provider using MyChart. We now offer e-Visits for anyone 43 and older to request care online for non-urgent symptoms. For details visit [email or web address].   Also download the MyChart app! Go to the app store, search MyChart, open the app, select , and log in with your MyChart username and password.

## 2024-10-03 NOTE — Patient Instructions (Signed)

## 2024-10-10 ENCOUNTER — Inpatient Hospital Stay

## 2024-10-10 VITALS — BP 102/71 | HR 71 | Temp 97.7°F | Resp 20 | Wt 223.0 lb

## 2024-10-10 DIAGNOSIS — E8581 Light chain (AL) amyloidosis: Secondary | ICD-10-CM | POA: Diagnosis not present

## 2024-10-10 LAB — CBC WITH DIFFERENTIAL/PLATELET
Abs Immature Granulocytes: 0.03 K/uL (ref 0.00–0.07)
Basophils Absolute: 0 K/uL (ref 0.0–0.1)
Basophils Relative: 1 %
Eosinophils Absolute: 0 K/uL (ref 0.0–0.5)
Eosinophils Relative: 1 %
HCT: 39 % (ref 36.0–46.0)
Hemoglobin: 13.2 g/dL (ref 12.0–15.0)
Immature Granulocytes: 1 %
Lymphocytes Relative: 9 %
Lymphs Abs: 0.3 K/uL — ABNORMAL LOW (ref 0.7–4.0)
MCH: 32.1 pg (ref 26.0–34.0)
MCHC: 33.8 g/dL (ref 30.0–36.0)
MCV: 94.9 fL (ref 80.0–100.0)
Monocytes Absolute: 0.3 K/uL (ref 0.1–1.0)
Monocytes Relative: 10 %
Neutro Abs: 2.7 K/uL (ref 1.7–7.7)
Neutrophils Relative %: 78 %
Platelets: 155 K/uL (ref 150–400)
RBC: 4.11 MIL/uL (ref 3.87–5.11)
RDW: 15.8 % — ABNORMAL HIGH (ref 11.5–15.5)
WBC: 3.5 K/uL — ABNORMAL LOW (ref 4.0–10.5)
nRBC: 0 % (ref 0.0–0.2)

## 2024-10-10 LAB — COMPREHENSIVE METABOLIC PANEL WITH GFR
ALT: 17 U/L (ref 0–44)
AST: 28 U/L (ref 15–41)
Albumin: 2.7 g/dL — ABNORMAL LOW (ref 3.5–5.0)
Alkaline Phosphatase: 109 U/L (ref 38–126)
Anion gap: 9 (ref 5–15)
BUN: 13 mg/dL (ref 8–23)
CO2: 28 mmol/L (ref 22–32)
Calcium: 7.9 mg/dL — ABNORMAL LOW (ref 8.9–10.3)
Chloride: 99 mmol/L (ref 98–111)
Creatinine, Ser: 1.32 mg/dL — ABNORMAL HIGH (ref 0.44–1.00)
GFR, Estimated: 41 mL/min — ABNORMAL LOW
Glucose, Bld: 87 mg/dL (ref 70–99)
Potassium: 3.9 mmol/L (ref 3.5–5.1)
Sodium: 137 mmol/L (ref 135–145)
Total Bilirubin: 0.4 mg/dL (ref 0.0–1.2)
Total Protein: 5 g/dL — ABNORMAL LOW (ref 6.5–8.1)

## 2024-10-10 MED ORDER — BORTEZOMIB CHEMO SQ INJECTION 3.5 MG (2.5MG/ML)
1.3000 mg/m2 | Freq: Once | INTRAMUSCULAR | Status: AC
  Administered 2024-10-10: 2.75 mg via SUBCUTANEOUS
  Filled 2024-10-10: qty 1.1

## 2024-10-10 MED ORDER — DARATUMUMAB-HYALURONIDASE-FIHJ 1800-30000 MG-UT/15ML ~~LOC~~ SOLN
1800.0000 mg | Freq: Once | SUBCUTANEOUS | Status: AC
  Administered 2024-10-10: 1800 mg via SUBCUTANEOUS
  Filled 2024-10-10: qty 15

## 2024-10-10 MED ORDER — DIPHENHYDRAMINE HCL 25 MG PO TABS
25.0000 mg | ORAL_TABLET | Freq: Once | ORAL | Status: AC
  Administered 2024-10-10: 25 mg via ORAL
  Filled 2024-10-10: qty 1

## 2024-10-10 MED ORDER — ACETAMINOPHEN 325 MG PO TABS
650.0000 mg | ORAL_TABLET | Freq: Once | ORAL | Status: AC
  Administered 2024-10-10: 650 mg via ORAL
  Filled 2024-10-10: qty 2

## 2024-10-10 MED ORDER — DEXAMETHASONE 4 MG PO TABS
40.0000 mg | ORAL_TABLET | Freq: Once | ORAL | Status: AC
  Administered 2024-10-10: 40 mg via ORAL
  Filled 2024-10-10: qty 10

## 2024-10-10 NOTE — Progress Notes (Signed)
 Patient presents today for D8C2 Velcade , Darzalex  Fasrpo injection. Lab work within parameters for treatment. Vital signs within parameters.   Patient states she did receive her oral Cytoxan  and is taking as prescribed. Patient has complaints of nausea and fatigue not relieved by rest. Patient denies diarrhea or vomiting and has a decreased appetite but continues to eat and drink fluids.   Treatment given today per MD orders. Tolerated infusion without adverse affects. Vital signs stable. No complaints at this time. Discharged from clinic by wheel chair in stable condition. Alert and oriented x 3. F/U with [facility] as scheduled.

## 2024-10-10 NOTE — Patient Instructions (Signed)
 [facility] [place] - A DEPT OF [facility] [facility]  Discharge Instructions: Thank you for choosing [facility] to provide your oncology and hematology care.  If you have a lab appointment with the Cancer Center - please note that after April 8th, 2024, all labs will be drawn in the cancer center.  You do not have to check in or register with the main entrance as you have in the past but will complete your check-in in the cancer center.  Wear comfortable clothing and clothing appropriate for easy access to any Portacath or PICC line.   We strive to give you quality time with your provider. You may need to reschedule your appointment if you arrive late (15 or more minutes).  Arriving late affects you and other patients whose appointments are after yours.  Also, if you miss three or more appointments without notifying the office, you may be dismissed from the clinic at the provider's discretion.      For prescription refill requests, have your pharmacy contact our office and allow 72 hours for refills to be completed.    Today you received the following chemotherapy and/or immunotherapy agents Velcade  and Darzalex  Faspro.  Daratumumab ; Hyaluronidase  Injection What is this medication? DARATUMUMAB ; HYALURONIDASE  (dar a toom ue mab; hye al ur ON i dase) treats multiple myeloma, a type of bone marrow cancer. Daratumumab  works by blocking a protein that causes cancer cells to grow and multiply. This helps to slow or stop the spread of cancer cells. Hyaluronidase  works by increasing the absorption of other medications in the body to help them work better. This medication may also be used treat amyloidosis, a condition that causes the buildup of a protein (amyloid) in your body. It works by reducing the buildup of this protein, which decreases symptoms. It is a combination medication that contains a monoclonal antibody. This medicine may be used for other purposes; ask your health  care provider or pharmacist if you have questions. COMMON BRAND NAME(S): DARZALEX  FASPRO What should I tell my care team before I take this medication? They need to know if you have any of these conditions: Heart disease Infection, such as chickenpox, cold sores, herpes, hepatitis B Lung or breathing disease An unusual or allergic reaction to daratumumab , hyaluronidase , other medications, foods, dyes, or preservatives Pregnant or trying to get pregnant Breast-feeding How should I use this medication? This medication is injected under the skin. It is given by your care team in a hospital or clinic setting. Talk to your care team about the use of this medication in children. Special care may be needed. Overdosage: If you think you have taken too much of this medicine contact a poison control center or emergency room at once. NOTE: This medicine is only for you. Do not share this medicine with others. What if I miss a dose? Keep appointments for follow-up doses. It is important not to miss your dose. Call your care team if you are unable to keep an appointment. What may interact with this medication? Interactions have not been studied. This list may not describe all possible interactions. Give your health care provider a list of all the medicines, herbs, non-prescription drugs, or dietary supplements you use. Also tell them if you smoke, drink alcohol, or use illegal drugs. Some items may interact with your medicine. What should I watch for while using this medication? Your condition will be monitored carefully while you are receiving this medication. This medication can  cause serious allergic reactions. To reduce your risk, your care team may give you other medication to take before receiving this one. Be sure to follow the directions from your care team. This medication can affect the results of blood tests to match your blood type. These changes can last for up to 6 months after the final  dose. Your care team will do blood tests to match your blood type before you start treatment. Tell all of your care team that you are being treated with this medication before receiving a blood transfusion. This medication can affect the results of some tests used to determine treatment response; extra tests may be needed to evaluate response. Talk to your care team if you wish to become pregnant or think you are pregnant. This medication can cause serious birth defects if taken during pregnancy and for 3 months after the last dose. A reliable form of contraception is recommended while taking this medication and for 3 months after the last dose. Talk to your care team about effective forms of contraception. Do not breast-feed while taking this medication. What side effects may I notice from receiving this medication? Side effects that you should report to your care team as soon as possible: Allergic reactions--skin rash, itching, hives, swelling of the face, lips, tongue, or throat Heart rhythm changes--fast or irregular heartbeat, dizziness, feeling faint or lightheaded, chest pain, trouble breathing Infection--fever, chills, cough, sore throat, wounds that don't heal, pain or trouble when passing urine, general feeling of discomfort or being unwell Infusion reactions--chest pain, shortness of breath or trouble breathing, feeling faint or lightheaded Sudden eye pain or change in vision such as blurry vision, seeing halos around lights, vision loss Unusual bruising or bleeding Side effects that usually do not require medical attention (report to your care team if they continue or are bothersome): Constipation Diarrhea Fatigue Nausea Pain, tingling, or numbness in the hands or feet Swelling of the ankles, hands, or feet This list may not describe all possible side effects. Call your doctor for medical advice about side effects. You may report side effects to FDA at 1-800-FDA-1088. Where should I  keep my medication? This medication is given in a hospital or clinic. It will not be stored at home. NOTE: This sheet is a summary. It may not cover all possible information. If you have questions about this medicine, talk to your doctor, pharmacist, or health care provider.  2024 Elsevier/Gold Standard (2022-01-13 00:00:00)Bortezomib  Injection What is this medication? BORTEZOMIB  (bor TEZ oh mib) treats lymphoma. It may also be used to treat multiple myeloma, a type of bone marrow cancer. It works by blocking a protein that causes cancer cells to grow and multiply. This helps to slow or stop the spread of cancer cells. This medicine may be used for other purposes; ask your health care provider or pharmacist if you have questions. COMMON BRAND NAME(S): BORUZU , Velcade  What should I tell my care team before I take this medication? They need to know if you have any of these conditions: Dehydration Diabetes Heart disease Liver disease Tingling of the fingers or toes or other nerve disorder An unusual or allergic reaction to bortezomib , other medications, foods, dyes, or preservatives If you or your partner are pregnant or trying to get pregnant Breastfeeding How should I use this medication? This medication is injected into a vein or under the skin. It is given by your care team in a hospital or clinic setting. Talk to your care team about the use  of this medication in children. Special care may be needed. Overdosage: If you think you have taken too much of this medicine contact a poison control center or emergency room at once. NOTE: This medicine is only for you. Do not share this medicine with others. What if I miss a dose? Keep appointments for follow-up doses. It is important not to miss your dose. Call your care team if you are unable to keep an appointment. What may interact with this medication? Ketoconazole Rifampin This list may not describe all possible interactions. Give your  health care provider a list of all the medicines, herbs, non-prescription drugs, or dietary supplements you use. Also tell them if you smoke, drink alcohol, or use illegal drugs. Some items may interact with your medicine. What should I watch for while using this medication? Your condition will be monitored carefully while you are receiving this medication. You may need blood work while taking this medication. This medication may affect your coordination, reaction time, or judgment. Do not drive or operate machinery until you know how this medication affects you. Sit up or stand slowly to reduce the risk of dizzy or fainting spells. Drinking alcohol with this medication can increase the risk of these side effects. This medication may increase your risk of getting an infection. Call your care team for advice if you get a fever, chills, sore throat, or other symptoms of a cold or flu. Do not treat yourself. Try to avoid being around people who are sick. Check with your care team if you have severe diarrhea, nausea, and vomiting, or if you sweat a lot. The loss of too much body fluid may make it dangerous for you to take this medication. Talk to your care team if you may be pregnant. Serious birth defects can occur if you take this medication during pregnancy and for 7 months after the last dose. You will need a negative pregnancy test before starting this medication. Contraception is recommended while taking this medication and for 7 months after the last dose. Your care team can help you find the option that works for you. If your partner can get pregnant, use a condom during sex while taking this medication and for 4 months after the last dose. Do not breastfeed while taking this medication and for 2 months after the last dose. This medication may cause infertility. Talk to your care team if you are concerned about your fertility. What side effects may I notice from receiving this medication? Side effects  that you should report to your care team as soon as possible: Allergic reactions--skin rash, itching, hives, swelling of the face, lips, tongue, or throat Bleeding--bloody or black, tar-like stools, vomiting blood or brown material that looks like coffee grounds, red or dark brown urine, small red or purple spots on skin, unusual bruising or bleeding Bleeding in the brain--severe headache, stiff neck, confusion, dizziness, change in vision, numbness or weakness of the face, arm, or leg, trouble speaking, trouble walking, vomiting Bowel blockage--stomach cramping, unable to have a bowel movement or pass gas, loss of appetite, vomiting Heart failure--shortness of breath, swelling of the ankles, feet, or hands, sudden weight gain, unusual weakness or fatigue Infection--fever, chills, cough, sore throat, wounds that don't heal, pain or trouble when passing urine, general feeling of discomfort or being unwell Liver injury--right upper belly pain, loss of appetite, nausea, light-colored stool, dark yellow or brown urine, yellowing skin or eyes, unusual weakness or fatigue Low blood pressure--dizziness, feeling faint or lightheaded,  blurry vision Lung injury--shortness of breath or trouble breathing, cough, spitting up blood, chest pain, fever Pain, tingling, or numbness in the hands or feet Severe or prolonged diarrhea Stomach pain, bloody diarrhea, pale skin, unusual weakness or fatigue, decrease in the amount of urine, which may be signs of hemolytic uremic syndrome Sudden and severe headache, confusion, change in vision, seizures, which may be signs of posterior reversible encephalopathy syndrome (PRES) TTP--purple spots on the skin or inside the mouth, pale skin, yellowing skin or eyes, unusual weakness or fatigue, fever, fast or irregular heartbeat, confusion, change in vision, trouble speaking, trouble walking Tumor lysis syndrome (TLS)--nausea, vomiting, diarrhea, decrease in the amount of urine,  dark urine, unusual weakness or fatigue, confusion, muscle pain or cramps, fast or irregular heartbeat, joint pain Side effects that usually do not require medical attention (report to your care team if they continue or are bothersome): Constipation Diarrhea Fatigue Loss of appetite Nausea This list may not describe all possible side effects. Call your doctor for medical advice about side effects. You may report side effects to FDA at 1-800-FDA-1088. Where should I keep my medication? This medication is given in a hospital or clinic. It will not be stored at home. NOTE: This sheet is a summary. It may not cover all possible information. If you have questions about this medicine, talk to your doctor, pharmacist, or health care provider.  2024 Elsevier/Gold Standard (2022-02-10 00:00:00)      To help prevent nausea and vomiting after your treatment, we encourage you to take your nausea medication as directed.  BELOW ARE SYMPTOMS THAT SHOULD BE REPORTED IMMEDIATELY: *FEVER GREATER THAN 100.4 F (38 C) OR HIGHER *CHILLS OR SWEATING *NAUSEA AND VOMITING THAT IS NOT CONTROLLED WITH YOUR NAUSEA MEDICATION *UNUSUAL SHORTNESS OF BREATH *UNUSUAL BRUISING OR BLEEDING *URINARY PROBLEMS (pain or burning when urinating, or frequent urination) *BOWEL PROBLEMS (unusual diarrhea, constipation, pain near the anus) TENDERNESS IN MOUTH AND THROAT WITH OR WITHOUT PRESENCE OF ULCERS (sore throat, sores in mouth, or a toothache) UNUSUAL RASH, SWELLING OR PAIN  UNUSUAL VAGINAL DISCHARGE OR ITCHING   Items with * indicate a potential emergency and should be followed up as soon as possible or go to the Emergency Department if any problems should occur.  Please show the CHEMOTHERAPY ALERT CARD or IMMUNOTHERAPY ALERT CARD at check-in to the Emergency Department and triage nurse.  Should you have questions after your visit or need to cancel or reschedule your appointment, please contact [facility] [place] -  A DEPT OF [facility] [phone]  and follow the prompts.  Office hours are 8:00 a.m. to 4:30 p.m. Monday - Friday. Please note that voicemails left after 4:00 p.m. may not be returned until the following business day.  We are closed weekends and major holidays. You have access to a nurse at all times for urgent questions. Please call the main number to the clinic [phone] and follow the prompts.  For any non-urgent questions, you may also contact your provider using MyChart. We now offer e-Visits for anyone 81 and older to request care online for non-urgent symptoms. For details visit [email or web address].   Also download the MyChart app! Go to the app store, search MyChart, open the app, select [place], and log in with your MyChart username and password.

## 2024-10-17 ENCOUNTER — Other Ambulatory Visit: Admitting: Oncology

## 2024-10-17 ENCOUNTER — Ambulatory Visit (HOSPITAL_COMMUNITY)
Admission: RE | Admit: 2024-10-17 | Discharge: 2024-10-17 | Disposition: A | Discharge status: Home / Self Care | Source: Ambulatory Visit | Attending: Oncology | Admitting: Oncology

## 2024-10-17 ENCOUNTER — Inpatient Hospital Stay

## 2024-10-17 ENCOUNTER — Other Ambulatory Visit: Payer: Self-pay

## 2024-10-17 VITALS — BP 105/63 | HR 71 | Temp 98.3°F | Resp 20 | Wt 225.2 lb

## 2024-10-17 DIAGNOSIS — D0511 Intraductal carcinoma in situ of right breast: Secondary | ICD-10-CM

## 2024-10-17 DIAGNOSIS — R600 Localized edema: Secondary | ICD-10-CM | POA: Diagnosis not present

## 2024-10-17 DIAGNOSIS — E8581 Light chain (AL) amyloidosis: Secondary | ICD-10-CM | POA: Diagnosis not present

## 2024-10-17 DIAGNOSIS — R2241 Localized swelling, mass and lump, right lower limb: Secondary | ICD-10-CM | POA: Insufficient documentation

## 2024-10-17 LAB — CBC WITH DIFFERENTIAL/PLATELET
Abs Immature Granulocytes: 0.02 10*3/uL (ref 0.00–0.07)
Basophils Absolute: 0 10*3/uL (ref 0.0–0.1)
Basophils Relative: 1 %
Eosinophils Absolute: 0.1 10*3/uL (ref 0.0–0.5)
Eosinophils Relative: 2 %
HCT: 38.2 % (ref 36.0–46.0)
Hemoglobin: 12.8 g/dL (ref 12.0–15.0)
Immature Granulocytes: 1 %
Lymphocytes Relative: 11 %
Lymphs Abs: 0.3 10*3/uL — ABNORMAL LOW (ref 0.7–4.0)
MCH: 32.5 pg (ref 26.0–34.0)
MCHC: 33.5 g/dL (ref 30.0–36.0)
MCV: 97 fL (ref 80.0–100.0)
Monocytes Absolute: 0.2 10*3/uL (ref 0.1–1.0)
Monocytes Relative: 7 %
Neutro Abs: 2.2 10*3/uL (ref 1.7–7.7)
Neutrophils Relative %: 78 %
Platelets: 153 10*3/uL (ref 150–400)
RBC: 3.94 MIL/uL (ref 3.87–5.11)
RDW: 15.9 % — ABNORMAL HIGH (ref 11.5–15.5)
WBC: 2.8 10*3/uL — ABNORMAL LOW (ref 4.0–10.5)
nRBC: 0 % (ref 0.0–0.2)

## 2024-10-17 LAB — COMPREHENSIVE METABOLIC PANEL WITH GFR
ALT: 21 U/L (ref 0–44)
AST: 34 U/L (ref 15–41)
Albumin: 2.8 g/dL — ABNORMAL LOW (ref 3.5–5.0)
Alkaline Phosphatase: 124 U/L (ref 38–126)
Anion gap: 10 (ref 5–15)
BUN: 14 mg/dL (ref 8–23)
CO2: 29 mmol/L (ref 22–32)
Calcium: 8.1 mg/dL — ABNORMAL LOW (ref 8.9–10.3)
Chloride: 98 mmol/L (ref 98–111)
Creatinine, Ser: 1.28 mg/dL — ABNORMAL HIGH (ref 0.44–1.00)
GFR, Estimated: 43 mL/min — ABNORMAL LOW
Glucose, Bld: 78 mg/dL (ref 70–99)
Potassium: 3.9 mmol/L (ref 3.5–5.1)
Sodium: 137 mmol/L (ref 135–145)
Total Bilirubin: 0.4 mg/dL (ref 0.0–1.2)
Total Protein: 5 g/dL — ABNORMAL LOW (ref 6.5–8.1)

## 2024-10-17 LAB — MAGNESIUM: Magnesium: 2.5 mg/dL — ABNORMAL HIGH (ref 1.7–2.4)

## 2024-10-17 MED ORDER — BORTEZOMIB CHEMO SQ INJECTION 3.5 MG (2.5MG/ML)
1.3000 mg/m2 | Freq: Once | INTRAMUSCULAR | Status: AC
  Administered 2024-10-17: 2.75 mg via SUBCUTANEOUS
  Filled 2024-10-17: qty 1.1

## 2024-10-17 MED ORDER — DARATUMUMAB-HYALURONIDASE-FIHJ 1800-30000 MG-UT/15ML ~~LOC~~ SOLN
1800.0000 mg | Freq: Once | SUBCUTANEOUS | Status: AC
  Administered 2024-10-17: 1800 mg via SUBCUTANEOUS
  Filled 2024-10-17: qty 15

## 2024-10-17 MED ORDER — DEXAMETHASONE 4 MG PO TABS
40.0000 mg | ORAL_TABLET | Freq: Once | ORAL | Status: AC
  Administered 2024-10-17: 40 mg via ORAL
  Filled 2024-10-17: qty 10

## 2024-10-17 MED ORDER — ACETAMINOPHEN 325 MG PO TABS
650.0000 mg | ORAL_TABLET | Freq: Once | ORAL | Status: AC
  Administered 2024-10-17: 650 mg via ORAL
  Filled 2024-10-17: qty 2

## 2024-10-17 MED ORDER — DIPHENHYDRAMINE HCL 25 MG PO TABS
25.0000 mg | ORAL_TABLET | Freq: Once | ORAL | Status: AC
  Administered 2024-10-17: 25 mg via ORAL
  Filled 2024-10-17: qty 1

## 2024-10-17 NOTE — Progress Notes (Signed)
 Oncology progress note  Evaluated in the infusion clinic. Patient reports swelling and knot like sensation in her right leg. Calf tenderness present with palpation and appears a little bigger than the left leg. SOB at baseline, no worsening reported.   Will obtain US  of the right leg to rule out DVT  Mickiel Dry, MD Hematology/Oncology [facility] at [facility]

## 2024-10-17 NOTE — Progress Notes (Signed)
 SPIRITUAL CARE AND COUNSELING CONSULT NOTE   VISIT SUMMARY   Reason for Visit: Chaplain identified Pt on the schedule as a Pt I had not connected with yet and visited to deliver introduction to Spiritual Care  Description of Visit: Upon arrival I found Sara [name] seated in the recliner receiving treatment, and her husband, Sara [name] was there as a support person.  I introduced myself as the chaplain for the cancer center and offered a brief education on the role of a chaplain and the support we can offer to our patients, caregivers, and staff.     Sara [name] immediately shared her faith with me, and parts of her story as a cancer survivor twice previous to this journey.  She and her husband have incorporated their faith into nearly every part of their lives.  She shared a story with me about using the scriptures to cope with her own negative attitude this morning.  Sara [name] and Sara [name] are surrounded by family in the area and appear to have great resources when it comes to care giving needs.  They enjoy being together at home- side by side in their recliners  Plan of Care: I will continue to follow up with Sara [name] on a monthly basis.   SPIRITUAL ENCOUNTER                                                                                                                                                                      Type of Visit: Initial Care provided to:: Patient, Significant other (Husband Sara [name]) Referral source: Chaplain assessment Reason for visit:  (Introduction to Spiritual Care) OnCall Visit: No   SPIRITUAL FRAMEWORK  Presenting Themes: Meaning/purpose/sources of inspiration, Goals in life/care, Values and beliefs, Significant life change, Coping tools, Impactful experiences and emotions, Courage hope and growth Values/beliefs: Pt and husband profess and practice strong Christian faith Community/Connection: Family, Designer, multimedia, Faith community, Significant other Strengths: Spirituality,  Hope, andvPerspective Needs/Challenges/Barriers: This is Pt's 3rd bout with cancer Patient Stress Factors: None identified Family Stress Factors: None identified   GOALS   Self/Personal Goals: Pt states, To trust and glorify God Clinical Care Goals: Built and maintain a relationship of care and support   INTERVENTIONS   Spiritual Care Interventions Made: Established relationship of care and support, Compassionate presence, Reflective listening, Narrative/life review, Explored values/beliefs/practices/strengths, Meaning making, Prayer    INTERVENTION OUTCOMES   Outcomes: Connection to spiritual care, Reduced isolation, Awareness of support  SPIRITUAL CARE PLAN   Spiritual Care Issues Still Outstanding: Chaplain will continue to follow   Sara [name], MDiv Chaplain, [facility] [email or web address] [phone] 10/17/2024 3:27 PM

## 2024-10-17 NOTE — Progress Notes (Unsigned)
 Patient here for treatment today, patient stated no appetite, just mainly drinking water , nausea with meds does help, complaining of her right lower leg with swollen area on the side-like facing front. does have some edema present on her left leg, complaining of right side abdominal pain, more freqently now. Provider notified.   Labs reviewed, ok to treat today.

## 2024-10-18 ENCOUNTER — Ambulatory Visit: Payer: Self-pay | Admitting: *Deleted

## 2024-10-18 NOTE — Progress Notes (Signed)
 Patient made aware of results and recommendations.  Verbalized understanding.

## 2024-10-20 ENCOUNTER — Other Ambulatory Visit: Payer: Self-pay

## 2024-10-20 ENCOUNTER — Other Ambulatory Visit: Payer: Self-pay | Admitting: Medical Genetics

## 2024-10-20 ENCOUNTER — Other Ambulatory Visit (HOSPITAL_COMMUNITY): Payer: Self-pay

## 2024-10-20 NOTE — Progress Notes (Signed)
 Specialty Pharmacy Refill Coordination Note  Sara [name] is a 79 y.o. female contacted today regarding refills of specialty medication(s) cycloPHOSphamide  (CYTOXAN )   Patient requested Delivery   Delivery date: 10/27/24   Verified address: [address] [place], [place] [address]   Medication will be filled on: 10/26/24

## 2024-10-24 ENCOUNTER — Inpatient Hospital Stay

## 2024-10-24 ENCOUNTER — Other Ambulatory Visit (HOSPITAL_COMMUNITY)

## 2024-10-24 ENCOUNTER — Encounter: Payer: Self-pay | Admitting: Oncology

## 2024-10-24 ENCOUNTER — Other Ambulatory Visit: Payer: Self-pay | Admitting: Oncology

## 2024-10-24 VITALS — BP 94/62 | HR 76 | Temp 98.2°F | Resp 20 | Wt 224.6 lb

## 2024-10-24 DIAGNOSIS — E8581 Light chain (AL) amyloidosis: Secondary | ICD-10-CM

## 2024-10-24 LAB — CBC WITH DIFFERENTIAL/PLATELET
Abs Immature Granulocytes: 0.02 10*3/uL (ref 0.00–0.07)
Basophils Absolute: 0 10*3/uL (ref 0.0–0.1)
Basophils Relative: 1 %
Eosinophils Absolute: 0.1 10*3/uL (ref 0.0–0.5)
Eosinophils Relative: 2 %
HCT: 36.3 % (ref 36.0–46.0)
Hemoglobin: 12.5 g/dL (ref 12.0–15.0)
Immature Granulocytes: 1 %
Lymphocytes Relative: 12 %
Lymphs Abs: 0.3 10*3/uL — ABNORMAL LOW (ref 0.7–4.0)
MCH: 32.6 pg (ref 26.0–34.0)
MCHC: 34.4 g/dL (ref 30.0–36.0)
MCV: 94.5 fL (ref 80.0–100.0)
Monocytes Absolute: 0.2 10*3/uL (ref 0.1–1.0)
Monocytes Relative: 8 %
Neutro Abs: 2.3 10*3/uL (ref 1.7–7.7)
Neutrophils Relative %: 76 %
Platelets: 145 10*3/uL — ABNORMAL LOW (ref 150–400)
RBC: 3.84 MIL/uL — ABNORMAL LOW (ref 3.87–5.11)
RDW: 16.2 % — ABNORMAL HIGH (ref 11.5–15.5)
WBC: 2.9 10*3/uL — ABNORMAL LOW (ref 4.0–10.5)
nRBC: 0 % (ref 0.0–0.2)

## 2024-10-24 LAB — COMPREHENSIVE METABOLIC PANEL WITH GFR
ALT: 23 U/L (ref 0–44)
AST: 42 U/L — ABNORMAL HIGH (ref 15–41)
Albumin: 2.6 g/dL — ABNORMAL LOW (ref 3.5–5.0)
Alkaline Phosphatase: 146 U/L — ABNORMAL HIGH (ref 38–126)
Anion gap: 13 (ref 5–15)
BUN: 17 mg/dL (ref 8–23)
CO2: 23 mmol/L (ref 22–32)
Calcium: 7.6 mg/dL — ABNORMAL LOW (ref 8.9–10.3)
Chloride: 98 mmol/L (ref 98–111)
Creatinine, Ser: 1.23 mg/dL — ABNORMAL HIGH (ref 0.44–1.00)
GFR, Estimated: 45 mL/min — ABNORMAL LOW
Glucose, Bld: 92 mg/dL (ref 70–99)
Potassium: 3.8 mmol/L (ref 3.5–5.1)
Sodium: 134 mmol/L — ABNORMAL LOW (ref 135–145)
Total Bilirubin: 0.3 mg/dL (ref 0.0–1.2)
Total Protein: 4.8 g/dL — ABNORMAL LOW (ref 6.5–8.1)

## 2024-10-24 LAB — MAGNESIUM: Magnesium: 2.3 mg/dL (ref 1.7–2.4)

## 2024-10-24 MED ORDER — ACETAMINOPHEN 325 MG PO TABS
650.0000 mg | ORAL_TABLET | Freq: Once | ORAL | Status: AC
  Administered 2024-10-24: 650 mg via ORAL
  Filled 2024-10-24: qty 2

## 2024-10-24 MED ORDER — DARATUMUMAB-HYALURONIDASE-FIHJ 1800-30000 MG-UT/15ML ~~LOC~~ SOLN
1800.0000 mg | Freq: Once | SUBCUTANEOUS | Status: AC
  Administered 2024-10-24: 1800 mg via SUBCUTANEOUS
  Filled 2024-10-24: qty 15

## 2024-10-24 MED ORDER — DIPHENHYDRAMINE HCL 25 MG PO TABS
25.0000 mg | ORAL_TABLET | Freq: Once | ORAL | Status: AC
  Administered 2024-10-24: 25 mg via ORAL
  Filled 2024-10-24: qty 1

## 2024-10-24 MED ORDER — DEXAMETHASONE 4 MG PO TABS
40.0000 mg | ORAL_TABLET | Freq: Once | ORAL | Status: AC
  Administered 2024-10-24: 40 mg via ORAL
  Filled 2024-10-24: qty 10

## 2024-10-24 MED ORDER — SUCRALFATE 1 GM/10ML PO SUSP: 1.0000 g | Freq: Three times a day (TID) | ORAL | 2 refills | Status: AC

## 2024-10-24 NOTE — Patient Instructions (Signed)
 [facility] [place] - A DEPT OF [facility]  Discharge Instructions: Thank you for choosing [facility] to provide your oncology and hematology care.  If you have a lab appointment with the Cancer Center - please note that after April 8th, 2024, all labs will be drawn in the cancer center.  You do not have to check in or register with the main entrance as you have in the past but will complete your check-in in the cancer center.  Wear comfortable clothing and clothing appropriate for easy access to any Portacath or PICC line.   We strive to give you quality time with your provider. You may need to reschedule your appointment if you arrive late (15 or more minutes).  Arriving late affects you and other patients whose appointments are after yours.  Also, if you miss three or more appointments without notifying the office, you may be dismissed from the clinic at the provider's discretion.      For prescription refill requests, have your pharmacy contact our office and allow 72 hours for refills to be completed.    Today you received the following chemotherapy and/or immunotherapy agents darzalex    To help prevent nausea and vomiting after your treatment, we encourage you to take your nausea medication as directed.  BELOW ARE SYMPTOMS THAT SHOULD BE REPORTED IMMEDIATELY: *FEVER GREATER THAN 100.4 F (38 C) OR HIGHER *CHILLS OR SWEATING *NAUSEA AND VOMITING THAT IS NOT CONTROLLED WITH YOUR NAUSEA MEDICATION *UNUSUAL SHORTNESS OF BREATH *UNUSUAL BRUISING OR BLEEDING *URINARY PROBLEMS (pain or burning when urinating, or frequent urination) *BOWEL PROBLEMS (unusual diarrhea, constipation, pain near the anus) TENDERNESS IN MOUTH AND THROAT WITH OR WITHOUT PRESENCE OF ULCERS (sore throat, sores in mouth, or a toothache) UNUSUAL RASH, SWELLING OR PAIN  UNUSUAL VAGINAL DISCHARGE OR ITCHING   Items with * indicate a potential emergency and should be followed up as  soon as possible or go to the Emergency Department if any problems should occur.  Please show the CHEMOTHERAPY ALERT CARD or IMMUNOTHERAPY ALERT CARD at check-in to the Emergency Department and triage nurse.  Should you have questions after your visit or need to cancel or reschedule your appointment, please contact [facility] [place] - A DEPT OF [facility] [phone]  and follow the prompts.  Office hours are 8:00 a.m. to 4:30 p.m. Monday - Friday. Please note that voicemails left after 4:00 p.m. may not be returned until the following business day.  We are closed weekends and major holidays. You have access to a nurse at all times for urgent questions. Please call the main number to the clinic [phone] and follow the prompts.  For any non-urgent questions, you may also contact your provider using MyChart. We now offer e-Visits for anyone 2 and older to request care online for non-urgent symptoms. For details visit [email or web address].   Also download the MyChart app! Go to the app store, search "MyChart", open the app, select [place], and log in with your MyChart username and password.

## 2024-10-24 NOTE — Progress Notes (Signed)
Patient tolerated Daratumumab injection with no complaints voiced.  See MAR for details.  Labs reviewed. Injection site clean and dry with no bruising or swelling noted at site.  Band aid applied.  Vss with discharge and left in satisfactory condition with no s/s of distress noted.

## 2024-10-25 ENCOUNTER — Other Ambulatory Visit: Payer: Self-pay | Admitting: Medical Genetics

## 2024-10-25 DIAGNOSIS — Z006 Encounter for examination for normal comparison and control in clinical research program: Secondary | ICD-10-CM

## 2024-10-26 ENCOUNTER — Other Ambulatory Visit: Payer: Self-pay

## 2024-10-26 ENCOUNTER — Other Ambulatory Visit: Payer: No Typology Code available for payment source

## 2024-10-26 ENCOUNTER — Ambulatory Visit (HOSPITAL_COMMUNITY)
Admission: RE | Admit: 2024-10-26 | Discharge: 2024-10-26 | Disposition: A | Payer: No Typology Code available for payment source | Source: Ambulatory Visit | Attending: Oncology | Admitting: Oncology

## 2024-10-26 ENCOUNTER — Encounter (HOSPITAL_COMMUNITY): Payer: Self-pay

## 2024-10-26 ENCOUNTER — Other Ambulatory Visit: Payer: Self-pay | Admitting: Oncology

## 2024-10-26 DIAGNOSIS — D0511 Intraductal carcinoma in situ of right breast: Secondary | ICD-10-CM

## 2024-10-26 DIAGNOSIS — E559 Vitamin D deficiency, unspecified: Secondary | ICD-10-CM

## 2024-10-31 ENCOUNTER — Inpatient Hospital Stay: Payer: Self-pay

## 2024-10-31 ENCOUNTER — Inpatient Hospital Stay

## 2024-10-31 ENCOUNTER — Inpatient Hospital Stay: Payer: Self-pay | Admitting: Oncology

## 2024-11-02 ENCOUNTER — Ambulatory Visit: Payer: Self-pay | Admitting: Oncology

## 2024-11-02 ENCOUNTER — Inpatient Hospital Stay: Payer: Self-pay | Admitting: Oncology

## 2024-11-07 ENCOUNTER — Inpatient Hospital Stay: Payer: Self-pay

## 2024-11-07 ENCOUNTER — Inpatient Hospital Stay

## 2024-11-14 ENCOUNTER — Inpatient Hospital Stay

## 2024-11-14 ENCOUNTER — Inpatient Hospital Stay: Payer: Self-pay

## 2024-11-21 ENCOUNTER — Inpatient Hospital Stay: Payer: Self-pay

## 2024-11-21 ENCOUNTER — Inpatient Hospital Stay

## 2024-11-27 ENCOUNTER — Inpatient Hospital Stay: Admitting: Oncology

## 2024-11-27 ENCOUNTER — Inpatient Hospital Stay

## 2024-12-04 ENCOUNTER — Inpatient Hospital Stay

## 2024-12-13 ENCOUNTER — Encounter: Admitting: Nutrition
